# Patient Record
Sex: Female | Born: 1940 | Race: White | Hispanic: No | Smoking: Never smoker
Health system: Southern US, Community
[De-identification: ages and names within clinical notes are randomized; demographics above are authoritative.]

## PROBLEM LIST (undated history)

## (undated) DIAGNOSIS — M1990 Unspecified osteoarthritis, unspecified site: Secondary | ICD-10-CM

## (undated) DIAGNOSIS — K219 Gastro-esophageal reflux disease without esophagitis: Secondary | ICD-10-CM

## (undated) DIAGNOSIS — Z9889 Other specified postprocedural states: Secondary | ICD-10-CM

## (undated) DIAGNOSIS — I050 Rheumatic mitral stenosis: Secondary | ICD-10-CM

## (undated) DIAGNOSIS — I340 Nonrheumatic mitral (valve) insufficiency: Secondary | ICD-10-CM

## (undated) DIAGNOSIS — R112 Nausea with vomiting, unspecified: Secondary | ICD-10-CM

## (undated) DIAGNOSIS — K648 Other hemorrhoids: Secondary | ICD-10-CM

## (undated) DIAGNOSIS — IMO0002 Reserved for concepts with insufficient information to code with codable children: Secondary | ICD-10-CM

## (undated) DIAGNOSIS — T7840XA Allergy, unspecified, initial encounter: Secondary | ICD-10-CM

## (undated) DIAGNOSIS — I2510 Atherosclerotic heart disease of native coronary artery without angina pectoris: Secondary | ICD-10-CM

## (undated) DIAGNOSIS — Z8489 Family history of other specified conditions: Secondary | ICD-10-CM

## (undated) DIAGNOSIS — C50919 Malignant neoplasm of unspecified site of unspecified female breast: Secondary | ICD-10-CM

## (undated) DIAGNOSIS — Z951 Presence of aortocoronary bypass graft: Secondary | ICD-10-CM

## (undated) DIAGNOSIS — E7800 Pure hypercholesterolemia, unspecified: Secondary | ICD-10-CM

## (undated) DIAGNOSIS — R011 Cardiac murmur, unspecified: Secondary | ICD-10-CM

## (undated) DIAGNOSIS — C189 Malignant neoplasm of colon, unspecified: Secondary | ICD-10-CM

## (undated) DIAGNOSIS — Z950 Presence of cardiac pacemaker: Secondary | ICD-10-CM

## (undated) DIAGNOSIS — I4821 Permanent atrial fibrillation: Secondary | ICD-10-CM

## (undated) DIAGNOSIS — M8580 Other specified disorders of bone density and structure, unspecified site: Secondary | ICD-10-CM

## (undated) DIAGNOSIS — R9430 Abnormal result of cardiovascular function study, unspecified: Secondary | ICD-10-CM

## (undated) DIAGNOSIS — H269 Unspecified cataract: Secondary | ICD-10-CM

## (undated) DIAGNOSIS — C180 Malignant neoplasm of cecum: Secondary | ICD-10-CM

## (undated) DIAGNOSIS — I442 Atrioventricular block, complete: Secondary | ICD-10-CM

## (undated) DIAGNOSIS — I272 Pulmonary hypertension, unspecified: Secondary | ICD-10-CM

## (undated) DIAGNOSIS — R9431 Abnormal electrocardiogram [ECG] [EKG]: Secondary | ICD-10-CM

## (undated) DIAGNOSIS — K635 Polyp of colon: Secondary | ICD-10-CM

## (undated) DIAGNOSIS — J45909 Unspecified asthma, uncomplicated: Secondary | ICD-10-CM

## (undated) DIAGNOSIS — K5730 Diverticulosis of large intestine without perforation or abscess without bleeding: Secondary | ICD-10-CM

## (undated) DIAGNOSIS — I509 Heart failure, unspecified: Secondary | ICD-10-CM

## (undated) DIAGNOSIS — E119 Type 2 diabetes mellitus without complications: Secondary | ICD-10-CM

## (undated) DIAGNOSIS — D649 Anemia, unspecified: Secondary | ICD-10-CM

## (undated) DIAGNOSIS — I519 Heart disease, unspecified: Secondary | ICD-10-CM

## (undated) DIAGNOSIS — D689 Coagulation defect, unspecified: Secondary | ICD-10-CM

## (undated) DIAGNOSIS — F329 Major depressive disorder, single episode, unspecified: Secondary | ICD-10-CM

## (undated) DIAGNOSIS — I495 Sick sinus syndrome: Secondary | ICD-10-CM

## (undated) DIAGNOSIS — I10 Essential (primary) hypertension: Secondary | ICD-10-CM

## (undated) DIAGNOSIS — T4145XA Adverse effect of unspecified anesthetic, initial encounter: Secondary | ICD-10-CM

## (undated) HISTORY — PX: ABDOMINAL HYSTERECTOMY: SHX81

## (undated) HISTORY — DX: Anemia, unspecified: D64.9

## (undated) HISTORY — PX: BREAST EXCISIONAL BIOPSY: SUR124

## (undated) HISTORY — DX: Reserved for concepts with insufficient information to code with codable children: IMO0002

## (undated) HISTORY — PX: COLONOSCOPY: SHX174

## (undated) HISTORY — DX: Nonrheumatic mitral (valve) insufficiency: I34.0

## (undated) HISTORY — DX: Essential (primary) hypertension: I10

## (undated) HISTORY — DX: Polyp of colon: K63.5

## (undated) HISTORY — DX: Atrioventricular block, complete: I44.2

## (undated) HISTORY — DX: Abnormal result of cardiovascular function study, unspecified: R94.30

## (undated) HISTORY — DX: Other specified postprocedural states: Z98.890

## (undated) HISTORY — PX: TONSILLECTOMY AND ADENOIDECTOMY: SUR1326

## (undated) HISTORY — DX: Presence of aortocoronary bypass graft: Z95.1

## (undated) HISTORY — DX: Pulmonary hypertension, unspecified: I27.20

## (undated) HISTORY — DX: Other specified disorders of bone density and structure, unspecified site: M85.80

## (undated) HISTORY — DX: Abnormal electrocardiogram (ECG) (EKG): R94.31

## (undated) HISTORY — DX: Diverticulosis of large intestine without perforation or abscess without bleeding: K57.30

## (undated) HISTORY — DX: Rheumatic mitral stenosis: I05.0

## (undated) HISTORY — DX: Coagulation defect, unspecified: D68.9

## (undated) HISTORY — DX: Permanent atrial fibrillation: I48.21

## (undated) HISTORY — DX: Malignant neoplasm of colon, unspecified: C18.9

## (undated) HISTORY — PX: POLYPECTOMY: SHX149

## (undated) HISTORY — DX: Atherosclerotic heart disease of native coronary artery without angina pectoris: I25.10

## (undated) HISTORY — DX: Allergy, unspecified, initial encounter: T78.40XA

## (undated) HISTORY — DX: Heart failure, unspecified: I50.9

## (undated) HISTORY — PX: BREAST SURGERY: SHX581

## (undated) HISTORY — DX: Malignant neoplasm of unspecified site of unspecified female breast: C50.919

## (undated) HISTORY — DX: Heart disease, unspecified: I51.9

## (undated) HISTORY — PX: EYE SURGERY: SHX253

## (undated) HISTORY — DX: Other hemorrhoids: K64.8

---

## 1993-09-04 HISTORY — PX: CHOLECYSTECTOMY: SHX55

## 1998-03-18 ENCOUNTER — Other Ambulatory Visit: Admission: RE | Admit: 1998-03-18 | Discharge: 1998-03-18 | Payer: Self-pay | Admitting: Cardiology

## 1998-03-19 ENCOUNTER — Ambulatory Visit (HOSPITAL_COMMUNITY): Admission: RE | Admit: 1998-03-19 | Discharge: 1998-03-19 | Payer: Self-pay | Admitting: Cardiovascular Disease

## 1998-11-23 ENCOUNTER — Other Ambulatory Visit: Admission: RE | Admit: 1998-11-23 | Discharge: 1998-11-23 | Payer: Self-pay | Admitting: *Deleted

## 1999-09-05 HISTORY — PX: CORONARY ARTERY BYPASS GRAFT: SHX141

## 1999-09-05 HISTORY — PX: MAZE: SHX5063

## 1999-09-05 HISTORY — PX: MITRAL VALVE REPAIR: SHX2039

## 1999-11-08 ENCOUNTER — Other Ambulatory Visit: Admission: RE | Admit: 1999-11-08 | Discharge: 1999-11-08 | Payer: Self-pay | Admitting: Plastic Surgery

## 1999-11-08 ENCOUNTER — Encounter (INDEPENDENT_AMBULATORY_CARE_PROVIDER_SITE_OTHER): Payer: Self-pay | Admitting: Specialist

## 1999-11-29 ENCOUNTER — Other Ambulatory Visit: Admission: RE | Admit: 1999-11-29 | Discharge: 1999-11-29 | Payer: Self-pay | Admitting: *Deleted

## 2000-03-20 ENCOUNTER — Ambulatory Visit (HOSPITAL_COMMUNITY): Admission: RE | Admit: 2000-03-20 | Discharge: 2000-03-20 | Payer: Self-pay | Admitting: Cardiology

## 2000-04-16 ENCOUNTER — Ambulatory Visit (HOSPITAL_COMMUNITY): Admission: RE | Admit: 2000-04-16 | Discharge: 2000-04-16 | Payer: Self-pay | Admitting: Cardiology

## 2001-08-13 ENCOUNTER — Encounter: Payer: Self-pay | Admitting: General Surgery

## 2001-08-13 ENCOUNTER — Encounter: Admission: RE | Admit: 2001-08-13 | Discharge: 2001-08-13 | Payer: Self-pay | Admitting: General Surgery

## 2002-02-06 ENCOUNTER — Encounter: Payer: Self-pay | Admitting: General Surgery

## 2002-02-06 ENCOUNTER — Encounter: Admission: RE | Admit: 2002-02-06 | Discharge: 2002-02-06 | Payer: Self-pay | Admitting: General Surgery

## 2002-07-11 ENCOUNTER — Other Ambulatory Visit: Admission: RE | Admit: 2002-07-11 | Discharge: 2002-07-11 | Payer: Self-pay | Admitting: Obstetrics and Gynecology

## 2002-08-04 ENCOUNTER — Encounter: Payer: Self-pay | Admitting: Gastroenterology

## 2002-08-04 DIAGNOSIS — K5730 Diverticulosis of large intestine without perforation or abscess without bleeding: Secondary | ICD-10-CM

## 2002-08-04 HISTORY — DX: Diverticulosis of large intestine without perforation or abscess without bleeding: K57.30

## 2002-11-03 ENCOUNTER — Encounter: Admission: RE | Admit: 2002-11-03 | Discharge: 2003-02-01 | Payer: Self-pay | Admitting: Internal Medicine

## 2003-02-17 ENCOUNTER — Encounter: Admission: RE | Admit: 2003-02-17 | Discharge: 2003-05-18 | Payer: Self-pay | Admitting: Internal Medicine

## 2003-02-25 ENCOUNTER — Encounter: Payer: Self-pay | Admitting: General Surgery

## 2003-02-25 ENCOUNTER — Encounter: Admission: RE | Admit: 2003-02-25 | Discharge: 2003-02-25 | Payer: Self-pay | Admitting: General Surgery

## 2003-09-14 ENCOUNTER — Other Ambulatory Visit: Admission: RE | Admit: 2003-09-14 | Discharge: 2003-09-14 | Payer: Self-pay | Admitting: Obstetrics and Gynecology

## 2004-03-09 ENCOUNTER — Encounter: Admission: RE | Admit: 2004-03-09 | Discharge: 2004-03-09 | Payer: Self-pay | Admitting: Internal Medicine

## 2004-08-01 ENCOUNTER — Ambulatory Visit: Payer: Self-pay | Admitting: Internal Medicine

## 2004-08-31 ENCOUNTER — Ambulatory Visit: Payer: Self-pay | Admitting: Internal Medicine

## 2004-09-26 ENCOUNTER — Ambulatory Visit: Payer: Self-pay

## 2004-09-28 ENCOUNTER — Ambulatory Visit: Payer: Self-pay | Admitting: Cardiology

## 2004-10-26 ENCOUNTER — Ambulatory Visit: Payer: Self-pay | Admitting: Cardiology

## 2004-11-22 ENCOUNTER — Ambulatory Visit: Payer: Self-pay | Admitting: Internal Medicine

## 2004-12-27 ENCOUNTER — Ambulatory Visit: Payer: Self-pay | Admitting: *Deleted

## 2005-01-24 ENCOUNTER — Ambulatory Visit: Payer: Self-pay | Admitting: Internal Medicine

## 2005-02-14 ENCOUNTER — Ambulatory Visit: Payer: Self-pay | Admitting: Internal Medicine

## 2005-03-14 ENCOUNTER — Encounter: Admission: RE | Admit: 2005-03-14 | Discharge: 2005-03-14 | Payer: Self-pay | Admitting: Internal Medicine

## 2005-03-16 ENCOUNTER — Ambulatory Visit: Payer: Self-pay | Admitting: Cardiology

## 2005-03-20 ENCOUNTER — Encounter: Admission: RE | Admit: 2005-03-20 | Discharge: 2005-03-20 | Payer: Self-pay | Admitting: Internal Medicine

## 2005-03-20 ENCOUNTER — Encounter (INDEPENDENT_AMBULATORY_CARE_PROVIDER_SITE_OTHER): Payer: Self-pay | Admitting: Specialist

## 2005-04-06 ENCOUNTER — Encounter: Admission: RE | Admit: 2005-04-06 | Discharge: 2005-04-06 | Payer: Self-pay | Admitting: General Surgery

## 2005-04-07 ENCOUNTER — Ambulatory Visit (HOSPITAL_COMMUNITY): Admission: RE | Admit: 2005-04-07 | Discharge: 2005-04-07 | Payer: Self-pay | Admitting: General Surgery

## 2005-04-07 ENCOUNTER — Ambulatory Visit: Payer: Self-pay | Admitting: Cardiology

## 2005-04-07 ENCOUNTER — Encounter (INDEPENDENT_AMBULATORY_CARE_PROVIDER_SITE_OTHER): Payer: Self-pay | Admitting: *Deleted

## 2005-04-07 ENCOUNTER — Encounter: Admission: RE | Admit: 2005-04-07 | Discharge: 2005-04-07 | Payer: Self-pay | Admitting: General Surgery

## 2005-04-12 ENCOUNTER — Ambulatory Visit: Payer: Self-pay | Admitting: Cardiology

## 2005-04-28 ENCOUNTER — Ambulatory Visit: Payer: Self-pay | Admitting: Internal Medicine

## 2005-05-03 ENCOUNTER — Ambulatory Visit: Payer: Self-pay | Admitting: *Deleted

## 2005-05-19 ENCOUNTER — Ambulatory Visit: Payer: Self-pay | Admitting: Cardiology

## 2005-06-07 ENCOUNTER — Ambulatory Visit: Payer: Self-pay | Admitting: Cardiology

## 2005-06-19 ENCOUNTER — Ambulatory Visit: Payer: Self-pay | Admitting: Internal Medicine

## 2005-07-03 ENCOUNTER — Ambulatory Visit: Payer: Self-pay | Admitting: Internal Medicine

## 2005-08-04 ENCOUNTER — Ambulatory Visit: Payer: Self-pay | Admitting: Cardiovascular Disease

## 2005-08-18 ENCOUNTER — Ambulatory Visit: Payer: Self-pay | Admitting: Cardiology

## 2005-09-25 ENCOUNTER — Ambulatory Visit: Payer: Self-pay | Admitting: Internal Medicine

## 2005-10-17 ENCOUNTER — Ambulatory Visit: Payer: Self-pay | Admitting: *Deleted

## 2005-10-31 ENCOUNTER — Ambulatory Visit: Payer: Self-pay | Admitting: *Deleted

## 2005-11-28 ENCOUNTER — Ambulatory Visit: Payer: Self-pay | Admitting: Cardiology

## 2005-12-26 ENCOUNTER — Ambulatory Visit: Payer: Self-pay | Admitting: Cardiovascular Disease

## 2005-12-27 ENCOUNTER — Ambulatory Visit: Payer: Self-pay | Admitting: Cardiology

## 2006-01-23 ENCOUNTER — Ambulatory Visit: Payer: Self-pay | Admitting: *Deleted

## 2006-02-20 ENCOUNTER — Ambulatory Visit: Payer: Self-pay | Admitting: Cardiology

## 2006-03-15 ENCOUNTER — Encounter: Admission: RE | Admit: 2006-03-15 | Discharge: 2006-03-15 | Payer: Self-pay | Admitting: General Surgery

## 2006-03-20 ENCOUNTER — Ambulatory Visit: Payer: Self-pay | Admitting: Cardiology

## 2006-04-17 ENCOUNTER — Ambulatory Visit: Payer: Self-pay | Admitting: Cardiology

## 2006-05-15 ENCOUNTER — Ambulatory Visit: Payer: Self-pay | Admitting: Cardiovascular Disease

## 2006-06-12 ENCOUNTER — Ambulatory Visit: Payer: Self-pay | Admitting: Internal Medicine

## 2006-07-10 ENCOUNTER — Ambulatory Visit: Payer: Self-pay | Admitting: *Deleted

## 2006-08-07 ENCOUNTER — Ambulatory Visit: Payer: Self-pay | Admitting: Cardiology

## 2006-08-21 ENCOUNTER — Ambulatory Visit: Payer: Self-pay | Admitting: Cardiology

## 2006-09-14 ENCOUNTER — Ambulatory Visit: Payer: Self-pay | Admitting: Gastroenterology

## 2006-09-18 ENCOUNTER — Ambulatory Visit: Payer: Self-pay | Admitting: Cardiology

## 2006-10-16 ENCOUNTER — Ambulatory Visit: Payer: Self-pay | Admitting: Cardiology

## 2006-11-13 ENCOUNTER — Ambulatory Visit: Payer: Self-pay | Admitting: Cardiology

## 2006-12-12 ENCOUNTER — Ambulatory Visit: Payer: Self-pay | Admitting: Internal Medicine

## 2006-12-18 ENCOUNTER — Ambulatory Visit: Payer: Self-pay | Admitting: Cardiology

## 2007-01-10 ENCOUNTER — Ambulatory Visit: Payer: Self-pay | Admitting: Cardiology

## 2007-02-06 ENCOUNTER — Ambulatory Visit: Payer: Self-pay | Admitting: Cardiology

## 2007-03-06 ENCOUNTER — Ambulatory Visit: Payer: Self-pay | Admitting: Cardiology

## 2007-03-18 ENCOUNTER — Encounter: Admission: RE | Admit: 2007-03-18 | Discharge: 2007-03-18 | Payer: Self-pay | Admitting: Internal Medicine

## 2007-04-10 ENCOUNTER — Ambulatory Visit: Payer: Self-pay | Admitting: Cardiology

## 2007-05-13 ENCOUNTER — Ambulatory Visit: Payer: Self-pay | Admitting: Cardiology

## 2007-06-11 ENCOUNTER — Ambulatory Visit: Payer: Self-pay | Admitting: Cardiovascular Disease

## 2007-07-09 ENCOUNTER — Ambulatory Visit: Payer: Self-pay | Admitting: Cardiology

## 2007-07-30 ENCOUNTER — Ambulatory Visit: Payer: Self-pay | Admitting: Internal Medicine

## 2007-08-27 ENCOUNTER — Ambulatory Visit: Payer: Self-pay | Admitting: Cardiology

## 2007-09-25 ENCOUNTER — Ambulatory Visit: Payer: Self-pay | Admitting: Cardiovascular Disease

## 2007-10-23 ENCOUNTER — Ambulatory Visit: Payer: Self-pay | Admitting: Internal Medicine

## 2007-11-21 ENCOUNTER — Ambulatory Visit: Payer: Self-pay | Admitting: Cardiology

## 2007-12-04 ENCOUNTER — Ambulatory Visit: Payer: Self-pay | Admitting: Internal Medicine

## 2007-12-19 ENCOUNTER — Ambulatory Visit: Payer: Self-pay | Admitting: Cardiology

## 2007-12-27 ENCOUNTER — Encounter: Payer: Self-pay | Admitting: Cardiology

## 2007-12-27 ENCOUNTER — Ambulatory Visit: Payer: Self-pay

## 2008-01-02 ENCOUNTER — Ambulatory Visit: Payer: Self-pay | Admitting: Cardiology

## 2008-01-23 ENCOUNTER — Ambulatory Visit: Payer: Self-pay | Admitting: Internal Medicine

## 2008-02-20 ENCOUNTER — Ambulatory Visit: Payer: Self-pay | Admitting: Cardiology

## 2008-03-19 ENCOUNTER — Encounter: Admission: RE | Admit: 2008-03-19 | Discharge: 2008-03-19 | Payer: Self-pay | Admitting: Internal Medicine

## 2008-03-19 ENCOUNTER — Ambulatory Visit: Payer: Self-pay | Admitting: Cardiology

## 2008-04-16 ENCOUNTER — Ambulatory Visit: Payer: Self-pay | Admitting: Cardiology

## 2008-05-18 ENCOUNTER — Ambulatory Visit: Payer: Self-pay | Admitting: Cardiology

## 2008-06-15 ENCOUNTER — Ambulatory Visit: Payer: Self-pay | Admitting: Cardiology

## 2008-06-22 ENCOUNTER — Ambulatory Visit: Payer: Self-pay | Admitting: Internal Medicine

## 2008-07-07 DIAGNOSIS — E782 Mixed hyperlipidemia: Secondary | ICD-10-CM

## 2008-07-07 DIAGNOSIS — E1169 Type 2 diabetes mellitus with other specified complication: Secondary | ICD-10-CM | POA: Insufficient documentation

## 2008-07-07 DIAGNOSIS — I10 Essential (primary) hypertension: Secondary | ICD-10-CM

## 2008-07-07 DIAGNOSIS — E1159 Type 2 diabetes mellitus with other circulatory complications: Secondary | ICD-10-CM | POA: Insufficient documentation

## 2008-07-07 HISTORY — DX: Essential (primary) hypertension: I10

## 2008-07-07 HISTORY — DX: Mixed hyperlipidemia: E78.2

## 2008-07-13 ENCOUNTER — Ambulatory Visit: Payer: Self-pay | Admitting: Gastroenterology

## 2008-07-20 ENCOUNTER — Ambulatory Visit: Payer: Self-pay | Admitting: Cardiology

## 2008-07-28 ENCOUNTER — Ambulatory Visit: Payer: Self-pay | Admitting: Cardiology

## 2008-08-25 ENCOUNTER — Ambulatory Visit: Payer: Self-pay | Admitting: Internal Medicine

## 2008-09-21 ENCOUNTER — Ambulatory Visit: Payer: Self-pay | Admitting: Cardiology

## 2008-10-20 ENCOUNTER — Ambulatory Visit: Payer: Self-pay | Admitting: Cardiology

## 2008-11-09 ENCOUNTER — Ambulatory Visit: Payer: Self-pay | Admitting: Cardiology

## 2008-11-25 ENCOUNTER — Ambulatory Visit: Payer: Self-pay | Admitting: Internal Medicine

## 2008-12-02 ENCOUNTER — Ambulatory Visit: Payer: Self-pay | Admitting: Cardiology

## 2008-12-14 ENCOUNTER — Ambulatory Visit: Payer: Self-pay | Admitting: Internal Medicine

## 2008-12-31 ENCOUNTER — Ambulatory Visit: Payer: Self-pay | Admitting: Cardiology

## 2009-01-27 ENCOUNTER — Ambulatory Visit: Payer: Self-pay | Admitting: Internal Medicine

## 2009-02-02 ENCOUNTER — Encounter: Payer: Self-pay | Admitting: *Deleted

## 2009-03-02 DIAGNOSIS — Z9089 Acquired absence of other organs: Secondary | ICD-10-CM | POA: Insufficient documentation

## 2009-03-02 DIAGNOSIS — E785 Hyperlipidemia, unspecified: Secondary | ICD-10-CM | POA: Insufficient documentation

## 2009-03-02 DIAGNOSIS — Z951 Presence of aortocoronary bypass graft: Secondary | ICD-10-CM

## 2009-03-02 DIAGNOSIS — E119 Type 2 diabetes mellitus without complications: Secondary | ICD-10-CM

## 2009-03-02 DIAGNOSIS — I2510 Atherosclerotic heart disease of native coronary artery without angina pectoris: Secondary | ICD-10-CM | POA: Insufficient documentation

## 2009-03-02 HISTORY — DX: Type 2 diabetes mellitus without complications: E11.9

## 2009-03-04 ENCOUNTER — Ambulatory Visit: Payer: Self-pay | Admitting: Cardiology

## 2009-03-10 ENCOUNTER — Encounter: Payer: Self-pay | Admitting: *Deleted

## 2009-03-25 ENCOUNTER — Encounter: Admission: RE | Admit: 2009-03-25 | Discharge: 2009-03-25 | Payer: Self-pay | Admitting: Internal Medicine

## 2009-03-25 ENCOUNTER — Ambulatory Visit: Payer: Self-pay | Admitting: Cardiology

## 2009-04-27 ENCOUNTER — Ambulatory Visit: Payer: Self-pay | Admitting: Internal Medicine

## 2009-05-26 ENCOUNTER — Ambulatory Visit: Payer: Self-pay | Admitting: Internal Medicine

## 2009-06-23 ENCOUNTER — Ambulatory Visit: Payer: Self-pay | Admitting: Internal Medicine

## 2009-07-13 ENCOUNTER — Ambulatory Visit: Payer: Self-pay | Admitting: Cardiology

## 2009-07-19 ENCOUNTER — Encounter: Payer: Self-pay | Admitting: Cardiology

## 2009-08-02 ENCOUNTER — Encounter: Payer: Self-pay | Admitting: Cardiology

## 2009-08-03 ENCOUNTER — Ambulatory Visit: Payer: Self-pay | Admitting: Cardiovascular Disease

## 2009-08-18 ENCOUNTER — Ambulatory Visit: Payer: Self-pay | Admitting: Internal Medicine

## 2009-09-16 ENCOUNTER — Ambulatory Visit: Payer: Self-pay | Admitting: Internal Medicine

## 2009-09-16 LAB — CONVERTED CEMR LAB: POC INR: 2

## 2009-10-11 ENCOUNTER — Encounter: Payer: Self-pay | Admitting: Cardiology

## 2009-10-12 ENCOUNTER — Encounter: Payer: Self-pay | Admitting: Cardiology

## 2009-10-12 LAB — CONVERTED CEMR LAB
POC INR: 1.98
Prothrombin Time: 22.3 s

## 2009-10-13 ENCOUNTER — Ambulatory Visit: Payer: Self-pay | Admitting: Cardiology

## 2009-10-13 ENCOUNTER — Inpatient Hospital Stay (HOSPITAL_COMMUNITY): Admission: AD | Admit: 2009-10-13 | Discharge: 2009-10-26 | Payer: Self-pay | Admitting: Cardiology

## 2009-10-14 ENCOUNTER — Encounter: Payer: Self-pay | Admitting: Cardiology

## 2009-10-16 ENCOUNTER — Encounter: Payer: Self-pay | Admitting: Cardiology

## 2009-10-19 ENCOUNTER — Encounter: Payer: Self-pay | Admitting: Cardiology

## 2009-10-20 ENCOUNTER — Encounter: Payer: Self-pay | Admitting: Cardiology

## 2009-10-20 ENCOUNTER — Ambulatory Visit: Payer: Self-pay | Admitting: Pulmonary Disease

## 2009-10-26 ENCOUNTER — Telehealth: Payer: Self-pay | Admitting: Cardiology

## 2009-10-27 ENCOUNTER — Encounter: Payer: Self-pay | Admitting: Cardiology

## 2009-10-28 ENCOUNTER — Encounter: Payer: Self-pay | Admitting: Cardiology

## 2009-10-28 ENCOUNTER — Telehealth: Payer: Self-pay | Admitting: Cardiology

## 2009-10-28 ENCOUNTER — Telehealth (INDEPENDENT_AMBULATORY_CARE_PROVIDER_SITE_OTHER): Payer: Self-pay | Admitting: *Deleted

## 2009-10-29 ENCOUNTER — Telehealth: Payer: Self-pay | Admitting: Cardiology

## 2009-11-08 ENCOUNTER — Encounter: Payer: Self-pay | Admitting: Cardiology

## 2009-11-08 DIAGNOSIS — I509 Heart failure, unspecified: Secondary | ICD-10-CM | POA: Insufficient documentation

## 2009-11-08 DIAGNOSIS — I079 Rheumatic tricuspid valve disease, unspecified: Secondary | ICD-10-CM | POA: Insufficient documentation

## 2009-11-08 DIAGNOSIS — I2789 Other specified pulmonary heart diseases: Secondary | ICD-10-CM | POA: Insufficient documentation

## 2009-11-09 ENCOUNTER — Ambulatory Visit: Payer: Self-pay | Admitting: Cardiology

## 2009-11-09 ENCOUNTER — Encounter: Payer: Self-pay | Admitting: Cardiology

## 2009-11-09 ENCOUNTER — Encounter (INDEPENDENT_AMBULATORY_CARE_PROVIDER_SITE_OTHER): Payer: Self-pay | Admitting: Cardiology

## 2009-11-09 LAB — CONVERTED CEMR LAB: POC INR: 3.7

## 2009-11-11 ENCOUNTER — Telehealth: Payer: Self-pay | Admitting: Cardiology

## 2009-11-16 ENCOUNTER — Telehealth: Payer: Self-pay | Admitting: Cardiology

## 2009-11-18 ENCOUNTER — Encounter: Payer: Self-pay | Admitting: Cardiology

## 2009-12-01 ENCOUNTER — Ambulatory Visit: Payer: Self-pay | Admitting: Cardiology

## 2009-12-01 ENCOUNTER — Ambulatory Visit: Payer: Self-pay | Admitting: Cardiovascular Disease

## 2009-12-22 ENCOUNTER — Ambulatory Visit: Payer: Self-pay | Admitting: Cardiology

## 2009-12-22 LAB — CONVERTED CEMR LAB: POC INR: 1.5

## 2010-01-06 ENCOUNTER — Ambulatory Visit: Payer: Self-pay | Admitting: Internal Medicine

## 2010-01-06 ENCOUNTER — Ambulatory Visit: Payer: Self-pay | Admitting: Cardiology

## 2010-01-06 DIAGNOSIS — I428 Other cardiomyopathies: Secondary | ICD-10-CM | POA: Insufficient documentation

## 2010-01-06 LAB — CONVERTED CEMR LAB: POC INR: 2.4

## 2010-01-20 ENCOUNTER — Telehealth: Payer: Self-pay | Admitting: Cardiology

## 2010-02-07 ENCOUNTER — Ambulatory Visit: Payer: Self-pay | Admitting: Cardiology

## 2010-02-07 ENCOUNTER — Ambulatory Visit: Payer: Self-pay

## 2010-02-07 ENCOUNTER — Encounter (INDEPENDENT_AMBULATORY_CARE_PROVIDER_SITE_OTHER): Payer: Self-pay | Admitting: *Deleted

## 2010-02-07 ENCOUNTER — Ambulatory Visit (HOSPITAL_COMMUNITY): Admission: RE | Admit: 2010-02-07 | Discharge: 2010-02-07 | Payer: Self-pay | Admitting: Cardiology

## 2010-02-07 ENCOUNTER — Encounter: Payer: Self-pay | Admitting: Cardiology

## 2010-02-07 ENCOUNTER — Ambulatory Visit: Payer: Self-pay | Admitting: Internal Medicine

## 2010-02-07 LAB — CONVERTED CEMR LAB: POC INR: 2.4

## 2010-02-16 ENCOUNTER — Encounter: Payer: Self-pay | Admitting: Cardiology

## 2010-02-17 ENCOUNTER — Ambulatory Visit: Payer: Self-pay | Admitting: Cardiology

## 2010-02-21 LAB — CONVERTED CEMR LAB
Chloride: 106 meq/L (ref 96–112)
Potassium: 3.9 meq/L (ref 3.5–5.1)
Sodium: 144 meq/L (ref 135–145)

## 2010-03-08 ENCOUNTER — Encounter: Payer: Self-pay | Admitting: Cardiology

## 2010-03-11 ENCOUNTER — Telehealth (INDEPENDENT_AMBULATORY_CARE_PROVIDER_SITE_OTHER): Payer: Self-pay | Admitting: *Deleted

## 2010-03-11 ENCOUNTER — Ambulatory Visit: Payer: Self-pay | Admitting: Cardiovascular Disease

## 2010-03-18 ENCOUNTER — Encounter: Payer: Self-pay | Admitting: Cardiology

## 2010-03-24 ENCOUNTER — Ambulatory Visit: Payer: Self-pay | Admitting: Cardiology

## 2010-03-31 ENCOUNTER — Encounter: Admission: RE | Admit: 2010-03-31 | Discharge: 2010-03-31 | Payer: Self-pay | Admitting: Internal Medicine

## 2010-04-04 ENCOUNTER — Encounter: Payer: Self-pay | Admitting: Cardiology

## 2010-04-07 ENCOUNTER — Ambulatory Visit: Payer: Self-pay | Admitting: Cardiovascular Disease

## 2010-04-21 ENCOUNTER — Ambulatory Visit (HOSPITAL_COMMUNITY): Admission: RE | Admit: 2010-04-21 | Discharge: 2010-04-21 | Payer: Self-pay | Admitting: Internal Medicine

## 2010-04-27 ENCOUNTER — Ambulatory Visit: Payer: Self-pay | Admitting: Internal Medicine

## 2010-04-27 LAB — CONVERTED CEMR LAB: POC INR: 3.6

## 2010-05-04 ENCOUNTER — Encounter: Payer: Self-pay | Admitting: Cardiology

## 2010-05-19 ENCOUNTER — Ambulatory Visit: Payer: Self-pay | Admitting: Internal Medicine

## 2010-05-19 LAB — CONVERTED CEMR LAB: POC INR: 4.2

## 2010-06-09 ENCOUNTER — Ambulatory Visit: Payer: Self-pay

## 2010-06-09 LAB — CONVERTED CEMR LAB: POC INR: 3

## 2010-07-07 ENCOUNTER — Ambulatory Visit: Payer: Self-pay | Admitting: Cardiology

## 2010-07-07 LAB — CONVERTED CEMR LAB: POC INR: 2.6

## 2010-07-21 ENCOUNTER — Encounter: Payer: Self-pay | Admitting: Cardiology

## 2010-08-11 ENCOUNTER — Ambulatory Visit: Payer: Self-pay | Admitting: Internal Medicine

## 2010-09-01 ENCOUNTER — Ambulatory Visit: Payer: Self-pay | Admitting: Cardiology

## 2010-09-29 ENCOUNTER — Ambulatory Visit: Admit: 2010-09-29 | Payer: Self-pay

## 2010-10-06 ENCOUNTER — Ambulatory Visit (INDEPENDENT_AMBULATORY_CARE_PROVIDER_SITE_OTHER): Payer: BC Managed Care – PPO | Admitting: Cardiology

## 2010-10-06 ENCOUNTER — Ambulatory Visit (INDEPENDENT_AMBULATORY_CARE_PROVIDER_SITE_OTHER)
Admission: RE | Admit: 2010-10-06 | Discharge: 2010-10-06 | Disposition: A | Discharge status: Home / Self Care | Payer: BC Managed Care – PPO | Source: Ambulatory Visit | Attending: Cardiology | Admitting: Cardiology

## 2010-10-06 ENCOUNTER — Encounter: Payer: Self-pay | Admitting: Cardiology

## 2010-10-06 ENCOUNTER — Encounter (INDEPENDENT_AMBULATORY_CARE_PROVIDER_SITE_OTHER): Payer: BC Managed Care – PPO

## 2010-10-06 ENCOUNTER — Other Ambulatory Visit: Payer: Self-pay | Admitting: Cardiology

## 2010-10-06 DIAGNOSIS — R0600 Dyspnea, unspecified: Secondary | ICD-10-CM

## 2010-10-06 DIAGNOSIS — I4891 Unspecified atrial fibrillation: Secondary | ICD-10-CM

## 2010-10-06 DIAGNOSIS — Z7901 Long term (current) use of anticoagulants: Secondary | ICD-10-CM

## 2010-10-06 DIAGNOSIS — R0609 Other forms of dyspnea: Secondary | ICD-10-CM

## 2010-10-06 DIAGNOSIS — I059 Rheumatic mitral valve disease, unspecified: Secondary | ICD-10-CM

## 2010-10-06 DIAGNOSIS — R0602 Shortness of breath: Secondary | ICD-10-CM

## 2010-10-06 LAB — CONVERTED CEMR LAB: POC INR: 3.8

## 2010-10-06 NOTE — Miscellaneous (Signed)
  Clinical Lists Changes  Observations: Added new observation of CARDIO HPI: I have reviewed the fact that patient is on Effexor (can prolong QT) and Tikosyn. On both drugs her QT is well within normal. She will remain on both drugs. (03/18/2010 15:08) Added new observation of PRIMARY MD: Marisue Brooklyn, DO (03/18/2010 15:08)      Primary Provider:  Marisue Brooklyn, DO   History of Present Illness: I have reviewed the fact that patient is on Effexor (can prolong QT) and Tikosyn. On both drugs her QT is well within normal. She will remain on both drugs.

## 2010-10-06 NOTE — Medication Information (Signed)
Summary: rov/ewj  Anticoagulant Therapy  Managed by: Bethena Midget, RN, BSN Referring MD: [name] MD PCP: Marisue Brooklyn, DO Supervising MD: Jens Som MD, Arlys John Indication 1: Atrial Fibrillation (ICD-427.31) Lab Used: [facility] Site: [facility] INR POC 1.5 INR RANGE 2 - 3  Dietary changes: yes       Details: Eating more green leafy veggies  Health status changes: no    Bleeding/hemorrhagic complications: no    Recent/future hospitalizations: no    Any changes in medication regimen? no    Recent/future dental: no  Any missed doses?: no       Is patient compliant with meds? yes       Allergies: 1)  ! * Tetanus 2)  ! Demerol (Meperidine Hcl) 3)  ! * Toprol 4)  ! Morphine  Anticoagulation Management History:      The patient is taking warfarin and comes in today for a routine follow up visit.  Positive risk factors for bleeding include an age of 21 years or older and presence of serious comorbidities.  The bleeding index is 'intermediate risk'.  Positive CHADS2 values include History of CHF, History of HTN, and History of Diabetes.  Negative CHADS2 values include Age > 11 years old.  The start date was 01/26/1998.  Anticoagulation responsible provider: Jens Som MD, Arlys John.  INR POC: 1.5.  Cuvette Lot#: [id number].  Exp: 02/2011.    Anticoagulation Management Assessment/Plan:      The patient's current anticoagulation dose is Coumadin 5 mg tabs: Take as directed by coumadin clinic..  The target INR is 2 - 3.  The next INR is due 01/06/2010.  Anticoagulation instructions were given to patient.  Results were reviewed/authorized by Bethena Midget, RN, BSN.  She was notified by Bethena Midget, RN, BSN.         Prior Anticoagulation Instructions: INR 1.7  Take 1.5 tablets today then start taking 1 tablet daily except 1/2 tablet on Tuesdays and Saturdays.  Recheck in 3 weeks.    Current Anticoagulation Instructions: INR 1.5 Today take 7.5mg s then change dose to 5mg s everyday  except 2.5mg s on Tuesdays. Recheck in 2 weeks.

## 2010-10-06 NOTE — Medication Information (Signed)
Summary: rov/sp  Anticoagulant Therapy  Managed by: Weston Brass, PharmD Referring MD: [name] MD PCP: Marisue Brooklyn, DO Supervising MD: Patty Sermons Indication 1: Atrial Fibrillation (ICD-427.31) Lab Used: [facility] Site: [facility] INR POC 2.6 INR RANGE 2 - 3  Dietary changes: no    Health status changes: no    Bleeding/hemorrhagic complications: no    Recent/future hospitalizations: no    Any changes in medication regimen? no    Recent/future dental: no  Any missed doses?: no       Is patient compliant with meds? yes       Allergies: 1)  ! * Tetanus 2)  ! Demerol (Meperidine Hcl) 3)  ! * Toprol 4)  ! Morphine  Anticoagulation Management History:      The patient is taking warfarin and comes in today for a routine follow up visit.  Positive risk factors for bleeding include an age of 72 years or older and presence of serious comorbidities.  The bleeding index is 'intermediate risk'.  Positive CHADS2 values include History of CHF, History of HTN, and History of Diabetes.  Negative CHADS2 values include Age > 64 years old.  The start date was 01/26/1998.  Anticoagulation responsible Anita Duke: [place].  INR POC: 2.6.  Cuvette Lot#: [id number].  Exp: 07/2011.    Anticoagulation Management Assessment/Plan:      The patient's current anticoagulation dose is Coumadin 5 mg tabs: Take as directed by coumadin clinic..  The target INR is 2 - 3.  The next INR is due 08/04/2010.  Anticoagulation instructions were given to patient.  Results were reviewed/authorized by Weston Brass, PharmD.  She was notified by Weston Brass PharmD.         Prior Anticoagulation Instructions: INR 3.0  Continue taking 1/2 tablet on Tuesday and Friday and 1 tablet all other days.  May incorporate one additional serving of greens per week. Return to clinic in 4 weeks.    Current Anticoagulation Instructions: INR 2.6  Continue same dose of 1 tablet every day except 1/2 tablet on Tuesday and Friday.   Recheck INR in 4 weeks.

## 2010-10-06 NOTE — Medication Information (Signed)
Summary: rov/sp  Anticoagulant Therapy  Managed by: Weston Brass, PharmD Referring MD: [name] MD PCP: Marisue Brooklyn, DO Supervising MD: Gala Romney MD, Reuel Boom Indication 1: Atrial Fibrillation (ICD-427.31) Lab Used: [facility] Site: [facility] INR POC 4.0 INR RANGE 2 - 3  Dietary changes: yes       Details: decreased green vegetables  Health status changes: no    Bleeding/hemorrhagic complications: no    Recent/future hospitalizations: no    Any changes in medication regimen? no    Recent/future dental: no  Any missed doses?: no       Is patient compliant with meds? yes       Allergies: 1)  ! * Tetanus 2)  ! Demerol (Meperidine Hcl) 3)  ! * Toprol 4)  ! Morphine  Anticoagulation Management History:      The patient is taking warfarin and comes in today for a routine follow up visit.  Positive risk factors for bleeding include an age of 71 years or older and presence of serious comorbidities.  The bleeding index is 'intermediate risk'.  Positive CHADS2 values include History of CHF, History of HTN, and History of Diabetes.  Negative CHADS2 values include Age > 71 years old.  The start date was 01/26/1998.  Anticoagulation responsible provider: Bensimhon MD, Reuel Boom.  INR POC: 4.0.  Cuvette Lot#: [id number].  Exp: 06/2011.    Anticoagulation Management Assessment/Plan:      The patient's current anticoagulation dose is Coumadin 5 mg tabs: Take as directed by coumadin clinic..  The target INR is 2 - 3.  The next INR is due 09/01/2010.  Anticoagulation instructions were given to patient.  Results were reviewed/authorized by Weston Brass, PharmD.  She was notified by Weston Brass PharmD.         Prior Anticoagulation Instructions: INR 2.6  Continue same dose of 1 tablet every day except 1/2 tablet on Tuesday and Friday.  Recheck INR in 4 weeks.   Current Anticoagulation Instructions: INR 4.0  Skip today's dose of Coumadin then resume same dose of 1 tablet every day except  1/2 tablet on Tuesday and Friday.  Recheck INR in 3 weeks.

## 2010-10-06 NOTE — Progress Notes (Signed)
Summary: carvedilol  Phone Note Refill Request Call back at [phone] Message from:  Patient on Jan 20, 2010 8:37 AM  Refills Requested: Medication #1:  CARVEDILOL 6.25 MG TABS Take one tablet by mouth twice a day.   Supply Requested: 1 year   Notes: 90 day supply [facility], please call pt after sent   Method Requested: Fax to Mail Away Pharmacy Initial call taken by: Migdalia Dk,  Jan 20, 2010 8:38 AM  Follow-up for Phone Call        called pt  Follow-up by: Hardin Negus, RMA,  Jan 20, 2010 4:33 PM    Prescriptions: CARVEDILOL 6.25 MG TABS (CARVEDILOL) Take one tablet by mouth twice a day  #180 x 3   Entered by:   Hardin Negus, RMA   Authorized by:   Talitha Givens, MD, [facility]   Signed by:   Hardin Negus, RMA on 01/20/2010   Method used:   Electronically to        [facility]* (mail-order)             ,          Ph: [phone]       Fax: [phone]   RxID:   [id number]

## 2010-10-06 NOTE — Miscellaneous (Signed)
  Clinical Lists Changes  Observations: Added new observation of ECHOINTERP: -------------------------------------------------------------------     Study Conclusions            - Left ventricle: The cavity size was normal. Wall thickness was       normal. Systolic function was normal. The estimated ejection       fraction was in the range of 55% to 60%.     - Mitral valve: MV is thickened, calcified (especially posterior       leaflet) with restricted motion. Peak and mean gradients through       toeh valve are 11 and 3 mm Hg respectively. MVA by pressure t 1/2       is 2.2 cm 2 consistent with mild MS,     - Left atrium: The atrium was moderately to severely dilated.     - Right atrium: The atrium was mildly dilated.        (02/07/2010 11:26) Added new observation of CARDCATHFIND:   IMPRESSION: 1. Global left ventricular systolic dysfunction. 2. Mild nonobstructive coronary artery disease. 3. No evidence of constrictive pericarditis. 4. Mild-to-moderate mitral stenosis with grading of 10 mmHg and the     mitral valve area estimated at 1.42 sq. cm.   RECOMMENDATIONS:  We will continue medical management for now.  We will resume her heparin drip 6 hours.  N.p.o. at midnight for TEE-guided cardioversion in a.m.     (10/18/2009 11:28)      Echocardiogram  Procedure date:  02/07/2010  Findings:      -------------------------------------------------------------------     Study Conclusions            - Left ventricle: The cavity size was normal. Wall thickness was       normal. Systolic function was normal. The estimated ejection       fraction was in the range of 55% to 60%.     - Mitral valve: MV is thickened, calcified (especially posterior       leaflet) with restricted motion. Peak and mean gradients through       toeh valve are 11 and 3 mm Hg respectively. MVA by pressure t 1/2       is 2.2 cm 2 consistent with mild MS,     - Left atrium: The atrium was moderately to  severely dilated.     - Right atrium: The atrium was mildly dilated.         Cardiac Cath  Procedure date:  10/18/2009  Findings:        IMPRESSION: 1. Global left ventricular systolic dysfunction. 2. Mild nonobstructive coronary artery disease. 3. No evidence of constrictive pericarditis. 4. Mild-to-moderate mitral stenosis with grading of 10 mmHg and the     mitral valve area estimated at 1.42 sq. cm.   RECOMMENDATIONS:  We will continue medical management for now.  We will resume her heparin drip 6 hours.  N.p.o. at midnight for TEE-guided cardioversion in a.m.

## 2010-10-06 NOTE — Medication Information (Signed)
Summary: rov/tm  Anticoagulant Therapy  Managed by: Shelby Dubin, PharmD, BCPS, CPP Referring MD: [name] MD PCP: Marisue Brooklyn, DO Supervising MD: Myrtis Ser MD, Tinnie Gens Indication 1: Atrial Fibrillation (ICD-427.31) Lab Used: [facility] Site: [facility] INR POC 3.7 INR RANGE 2 - 3  Dietary changes: no    Health status changes: no    Bleeding/hemorrhagic complications: no    Recent/future hospitalizations: no    Any changes in medication regimen? no    Recent/future dental: no  Any missed doses?: no       Is patient compliant with meds? yes      Comments: Seeing JKatz today  Current Medications (verified): 1)  Actos 45 Mg Tabs (Pioglitazone Hcl) .Marland Kitchen.. 1 Tablet By Mouth Once Daily 2)  Coumadin 5 Mg Tabs (Warfarin Sodium) .... Take As Directed By Coumadin Clinic. 3)  Furosemide 40 Mg Tabs (Furosemide) .Marland Kitchen.. 1 Tablet By Mouth Once Daily 4)  Klor-Con 8 Meq Cr-Tabs (Potassium Chloride) .... One Tablet Three Times A Day 5)  Aspirin 81 Mg Tbec (Aspirin) .... One Tablet By Mouth Once Daily 6)  Effexor 37.5 Mg Tabs (Venlafaxine Hcl) .Marland Kitchen.. 1 Tablet By Mouth Once Daily 7)  Fish Oil  Oil (Fish Oil) .Marland Kitchen.. 1000 Mg Two Times A Day 8)  Calcium 600 1500 Mg Tabs (Calcium Carbonate) .Marland Kitchen.. 1 Tablet Two Times A Day 9)  Lipitor 40 Mg Tabs (Atorvastatin Calcium) .... Take 1 Tablet By Mouth Once A Day 10)  Glimepiride 2 Mg Tabs (Glimepiride) .... 1/2 Tablet By Mouth Once Daily 11)  Mavik 4 Mg Tabs (Trandolapril) .... Take 2 Tablets By Mouth Once A Day 12)  Vitamin D3 1000 Unit Caps (Cholecalciferol) .... Take 4 Tablets By Mouth Once Daily 13)  Tikosyn 500 Mcg Caps (Dofetilide) .... Take One Capsule By Mouth Twice A Day  Allergies: 1)  ! * Tetanus 2)  ! Demerol (Meperidine Hcl) 3)  ! * Toprol 4)  ! Morphine  Anticoagulation Management History:      The patient is taking warfarin and comes in today for a routine follow up visit.  Positive risk factors for bleeding include an age of 71 years  or older and presence of serious comorbidities.  The bleeding index is 'intermediate risk'.  Positive CHADS2 values include History of CHF, History of HTN, and History of Diabetes.  Negative CHADS2 values include Age > 11 years old.  The start date was 01/26/1998.  Anticoagulation responsible provider: Myrtis Ser MD, Tinnie Gens.  INR POC: 3.7.  Cuvette Lot#: [id number].  Exp: 01/2011.    Anticoagulation Management Assessment/Plan:      The patient's current anticoagulation dose is Coumadin 5 mg tabs: Take as directed by coumadin clinic..  The target INR is 2 - 3.  The next INR is due 11/30/2009.  Anticoagulation instructions were given to patient.  Results were reviewed/authorized by Shelby Dubin, PharmD, BCPS, CPP.  She was notified by Shelby Dubin PharmD, BCPS, CPP.         Prior Anticoagulation Instructions: INR 1.98 LMOM Bethena Midget, RN, BSN  October 12, 2009 1:34 PM Spoke with pt she is aware to change dose to 5mg s daily except 2.5mg s on Tues and Sat. Recheck in 2 weeks.   Current Anticoagulation Instructions: INR 3.7  Skip 1 day then 0.5 tab each Tuesday, Thursday, Saturday and 1 tab on all other days.   Recheck in 2 - 3 weeks.

## 2010-10-06 NOTE — Medication Information (Signed)
Summary: rov/tm  Anticoagulant Therapy  Managed by: Bethena Midget, RN, BSN Referring MD: [name] MD PCP: Marisue Brooklyn, DO Supervising MD: Eden Emms MD, Theron Arista Indication 1: Atrial Fibrillation (ICD-427.31) Lab Used: [facility] Site: [facility] INR POC 2.9 INR RANGE 2 - 3  Dietary changes: no    Health status changes: no    Bleeding/hemorrhagic complications: no    Recent/future hospitalizations: no    Any changes in medication regimen? no    Recent/future dental: no  Any missed doses?: no       Is patient compliant with meds? yes       Allergies: 1)  ! * Tetanus 2)  ! Demerol (Meperidine Hcl) 3)  ! * Toprol 4)  ! Morphine  Anticoagulation Management History:      The patient is taking warfarin and comes in today for a routine follow up visit.  Positive risk factors for bleeding include an age of 70 years or older and presence of serious comorbidities.  The bleeding index is 'intermediate risk'.  Positive CHADS2 values include History of CHF, History of HTN, and History of Diabetes.  Negative CHADS2 values include Age > 71 years old.  The start date was 01/26/1998.  Anticoagulation responsible provider: Eden Emms MD, Theron Arista.  INR POC: 2.9.  Cuvette Lot#: [id number].  Exp: 04/2011.    Anticoagulation Management Assessment/Plan:      The patient's current anticoagulation dose is Coumadin 5 mg tabs: Take as directed by coumadin clinic..  The target INR is 2 - 3.  The next INR is due 04/07/2010.  Anticoagulation instructions were given to patient.  Results were reviewed/authorized by Bethena Midget, RN, BSN.  She was notified by Dillard Cannon.         Prior Anticoagulation Instructions: INR 2.4 Continue 5mg  everyday except 2.5mg s on Tuesdays. Recheck in 4 weeks.   Current Anticoagulation Instructions: INR 2.9  Continue 0.5 tab on Tuesday and 1 tab all other days.  Recheck INR in 4 weeks.

## 2010-10-06 NOTE — Progress Notes (Signed)
Summary: does pt need sooner appt  Phone Note Call from Patient Call back at [phone]   Caller: Patient Reason for Call: Talk to Nurse, Talk to Doctor Summary of Call: per pt call her coumadin level was 2.49 and she wants to know if she needs to be seen sooner Initial call taken by: Omer Jack,  October 26, 2009 1:00 PM  Follow-up for Phone Call        pt sch for [facility] 2/24 and Dr Myrtis Ser 3/8, pt d/c'd from hospital today, INR this AM was 2.49 needs to know if she should keep cv[facility] appt 2/24 or if it can be resch out, will send to [facility] to clarify Meredith Staggers, RN  October 26, 2009 2:18 PM   Additional Follow-up for Phone Call Additional follow up Details #1::        Called spoke with pt's husband.  Cardizem changed from 240 to 120, and pt was started on Tikosyn 500mg  2 daily.  Pt was discharged from hospital on same home dosage of coumadin INR at discharge was 2.49.  R/S appt on 10/28/09 to 11/09/09 at 3:15pm prior to 3:45pm appt with Dr Myrtis Ser. Additional Follow-up by: Cloyde Reams RN,  October 26, 2009 4:32 PM

## 2010-10-06 NOTE — Medication Information (Signed)
Summary: cvrr  Anticoagulant Therapy  Managed by: Bethena Midget, RN, BSN Referring MD: [name] MD PCP: Marisue Brooklyn, DO Supervising MD: Clifton James MD, Cristal Deer Indication 1: Atrial Fibrillation (ICD-427.31) Lab Used: [facility] Site: [facility] INR POC 2.7 INR RANGE 2 - 3  Dietary changes: no    Health status changes: yes       Details: See below  Bleeding/hemorrhagic complications: no    Recent/future hospitalizations: no    Any changes in medication regimen? yes       Details: Prescribed Norco 5-325 last week for back injury  Recent/future dental: no  Any missed doses?: no       Is patient compliant with meds? yes      Comments: Injured back 3 weeks ago, saw MD last week  Allergies: 1)  ! * Tetanus 2)  ! Demerol (Meperidine Hcl) 3)  ! * Toprol 4)  ! Morphine  Anticoagulation Management History:      The patient is taking warfarin and comes in today for a routine follow up visit.  Positive risk factors for bleeding include an age of 70 years or older and presence of serious comorbidities.  The bleeding index is 'intermediate risk'.  Positive CHADS2 values include History of CHF, History of HTN, and History of Diabetes.  Negative CHADS2 values include Age > 22 years old.  The start date was 01/26/1998.  Anticoagulation responsible provider: Clifton James MD, Cristal Deer.  INR POC: 2.7.  Cuvette Lot#: [id number].  Exp: 06/2011.    Anticoagulation Management Assessment/Plan:      The patient's current anticoagulation dose is Coumadin 5 mg tabs: Take as directed by coumadin clinic..  The target INR is 2 - 3.  The next INR is due 04/27/2010.  Anticoagulation instructions were given to patient.  Results were reviewed/authorized by Bethena Midget, RN, BSN.  She was notified by Liana Gerold, PharmD Candidate.         Prior Anticoagulation Instructions: INR 2.9  Continue 0.5 tab on Tuesday and 1 tab all other days.  Recheck INR in 4 weeks.  Current Anticoagulation  Instructions: INR 2.7  Continue Coumadin 1 tablet daily except 1/2 tablet on Tuesdays. Return to clinic in 4 weeks.

## 2010-10-06 NOTE — Assessment & Plan Note (Signed)
Summary: rov   Primary Provider:  Marisue Brooklyn, DO  CC:  pt complains of tightness in chest and sob.  History of Present Illness: 70 year old female followed by Dr. Myrtis Ser. She has a history of mitral valve repair and maze procedure at the [facility] in 2001. She also had a LIMA to the LAD. She has continued to have paroxysmal atrial fibrillation. Last echocardiogram in April of 2009 showed normal LV function. She is status post mitral valve repair or with trivial mitral regurgitation. The left atrium was moderately dilated and the right atrium is mildly dilated. She was last seen in this office in July of 2010 and was in sinus rhythm at that time. She was seen by her primary care physician for sinusitis and noted to be in atrial fibrillation with a rapid ventricular response and she was referred to our office as an add on. Note her INR was 1.98 on October 12, 2009. The patient does not have dyspnea on exertion, orthopnea, PND, pedal edema, chest pain or syncope. 5 days ago she developed URI-type symptoms. She complains of congestion, cough and increased shortness of breath. She has also noticed an elevation in her heart rate and palpitations. There's been no syncope. She also notices a continuous chest tightness for the past 5 days. She was seen by her primary care physician 2 days ago and started on a Z-Pak. However her symptoms did not improve and she returned today and was given Avelox. They noticed that her heart rate was elevated and asked for [name] to evaluate the patient.  Current Medications (verified): 1)  Actos 45 Mg Tabs (Pioglitazone Hcl) .Marland Kitchen.. 1 Tablet By Mouth Once Daily 2)  Coumadin 5 Mg Tabs (Warfarin Sodium) .... Take As Directed By Coumadin Clinic. 3)  Diltiazem Hcl Cr 240 Mg Xr24h-Cap (Diltiazem Hcl) .... Two Times A Day 4)  Furosemide 40 Mg Tabs (Furosemide) .Marland Kitchen.. 1 Tablet By Mouth Once Daily 5)  Klor-Con 8 Meq Cr-Tabs (Potassium Chloride) .... One Tablet Three Times A Day 6)  Aspirin  81 Mg Tbec (Aspirin) .... One Tablet By Mouth Once Daily 7)  Effexor 37.5 Mg Tabs (Venlafaxine Hcl) .Marland Kitchen.. 1 Tablet By Mouth Once Daily 8)  Fish Oil  Oil (Fish Oil) .Marland Kitchen.. 1000 Mg Two Times A Day 9)  Calcium 600 1500 Mg Tabs (Calcium Carbonate) .Marland Kitchen.. 1 Tablet Two Times A Day 10)  Lipitor 40 Mg Tabs (Atorvastatin Calcium) .... Take 1 Tablet By Mouth Once A Day 11)  Glimepiride 2 Mg Tabs (Glimepiride) .... 1/2 Tablet By Mouth Once Daily 12)  Norvasc 5 Mg Tabs (Amlodipine Besylate) .... Take 1 Tablet By Mouth Once A Day 13)  Lanoxin 0.25 Mg Tabs (Digoxin) .... Take 1 Tablet By Mouth Once A Day 14)  Mavik 4 Mg Tabs (Trandolapril) .... Take 2 Tablets By Mouth Once A Day 15)  Vitamin D3 1000 Unit Caps (Cholecalciferol) .... Take 4 Tablets By Mouth Once Daily  Allergies: 1)  ! * Tetanus 2)  ! Demerol (Meperidine Hcl) 3)  ! * Toprol 4)  ! 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  Past Surgical History: Reviewed history from 03/02/2009 and no  changes required. * MAZE PROCEDURE, HX OF MITRAL VALVE REPLACEMENT, HX OF (ICD-V15.1) HYSTERECTOMY (ICD-V88.01) CORONARY ARTERY BYPASS GRAFT, HX OF (ICD-V45.81) CHOLECYSTECTOMY, HX OF (ICD-V45.79) Multiple Breast Lumpectomy  Family History: Reviewed history from 07/13/2008 and no changes required. Family History of Diabetes: Mother, Father, Sister, Paternal Grandparents Maternal Grandfather Cancer ? type--deceased Family History of Heart Disease: Father, Dennie Bible. Uncles, Sister  Social  History: Reviewed history from 07/13/2008 and no changes required. Married,  Occupation: Field seismologist Patient has never smoked.  Alcohol Use - no Daily Caffeine Use Illicit Drug Use - no Patient does not get regular exercise.   Review of Systems       Recent sinusitis/ URI but no hemoptysis, dysphasia, odynophagia, melena, hematochezia, dysuria, hematuria, rash, seizure activity, orthopnea, PND, pedal edema, claudication. Remaining systems are negative.   Vital Signs:  Patient profile:   70 year old female Height:      62 inches Weight:      176 pounds BMI:     32.31 Pulse rate:   123 / minute Pulse rhythm:   irregular Resp:     14 per minute BP sitting:   161 / 89  (left arm) Cuff size:   large  Vitals Entered By: Kem Parkinson (October 13, 2009 12:24 PM)  Physical Exam  General:  Well developed/well nourished in mild distress Skin warm/dry Patient not depressed No peripheral clubbing Back-normal HEENT-normal/normal eyelids Neck supple/normal carotid upstroke bilaterally; no bruits; no JVD; no thyromegaly chest - CTA/ normal expansion CV - irregular, tachycardic, no murmurs Abdomen -NT/ND, no HSM, no mass, + bowel sounds, no bruit 2+ femoral pulses, no bruits Ext-no edema, chords, 2+ DP Neuro-grossly nonfocal     EKG  Procedure date:  10/13/2009  Findings:      Atrial fibrillation at a rate of 123. Axis normal. Nonspecific ST changes.  Impression & Recommendations:  Problem # 1:  ATRIAL FIBRILLATION, HX OF (ICD-V12.59) Patient is in recurrent atrial fibrillation with a rapid ventricular response. Will admit to telemetry. Continue Cardizem and digoxin. Add Lopressor to help with rate control. Continue Coumadin with goal INR 2-3. Check echocardiogram and TSH. Recurrent atrial fibrillation may be related to upper respiratory infection. Once this is treated with plan to proceed with cardioversion once INR therapeutic for 3 consecutive weeks. The following  medications were removed from the medication list:    Metoprolol Tartrate 25 Mg Tabs (Metoprolol tartrate) .Marland Kitchen... Take one tablet by mouth twice a day Her updated medication list for this problem includes:    Coumadin 5 Mg Tabs (Warfarin sodium) .Marland Kitchen... Take as directed by coumadin clinic.    Diltiazem Hcl Cr 240 Mg Xr24h-cap (Diltiazem hcl) .Marland Kitchen..Marland Kitchen Two times a day    Aspirin 81 Mg Tbec (Aspirin) ..... One tablet by mouth once daily    Norvasc 5 Mg Tabs (Amlodipine besylate) .Marland Kitchen... Take 1 tablet by mouth once a day    Mavik 4 Mg Tabs (Trandolapril) .Marland Kitchen... Take 2 tablets by mouth once a day  Orders: T-2 View CXR ([id number])  Problem # 2:  URI (ICD-465.9) Patient complains of significant congestion. Will begin IV antibiotics.  Problem # 3:  CAD (ICD-414.00) Continue aspirin and statin. Cycle enzymes. Note she was describing chest tightness but this is more likely related to upper respiratory infection. The following medications were removed from the medication list:    Metoprolol Tartrate 25 Mg Tabs (Metoprolol tartrate) .Marland Kitchen... Take one tablet by mouth twice a day Her updated medication list for this problem includes:  Coumadin 5 Mg Tabs (Warfarin sodium) .Marland Kitchen... Take as directed by coumadin clinic.    Diltiazem Hcl Cr 240 Mg Xr24h-cap (Diltiazem hcl) .Marland Kitchen..Marland Kitchen Two times a day    Aspirin 81 Mg Tbec (Aspirin) ..... One tablet by mouth once daily    Norvasc 5 Mg Tabs (Amlodipine besylate) .Marland Kitchen... Take 1 tablet by mouth once a day    Mavik 4 Mg Tabs (Trandolapril) .Marland Kitchen... Take 2 tablets by mouth once a day  Problem # 4:  DIABETES MELLITUS (ICD-250.00)  Her updated medication list for this problem includes:    Actos 45 Mg Tabs (Pioglitazone hcl) .Marland Kitchen... 1 tablet by mouth once daily    Aspirin 81 Mg Tbec (Aspirin) ..... One tablet by mouth once daily    Glimepiride 2 Mg Tabs (Glimepiride) .Marland Kitchen... 1/2 tablet by mouth once daily    Mavik 4 Mg Tabs (Trandolapril) .Marland Kitchen... Take 2 tablets by mouth once a day  Problem #  5:  MITRAL VALVE REPLACEMENT, HX OF (ICD-V15.1) Check echocardiogram. Continue SBE prophylaxis.  Problem # 6:  COUMADIN THERAPY (ICD-V58.61)  Problem # 7:  HYPERLIPIDEMIA (ICD-272.4) Continue statin. Her updated medication list for this problem includes:    Lipitor 40 Mg Tabs (Atorvastatin calcium) .Marland Kitchen... Take 1 tablet by mouth once a day  Problem # 8:  HYPERTENSION (ICD-401.9) Blood pressure elevated. Add Lopressor as described above both for blood pressure and rate control. The following medications were removed from the medication list:    Metoprolol Tartrate 25 Mg Tabs (Metoprolol tartrate) .Marland Kitchen... Take one tablet by mouth twice a day Her updated medication list for this problem includes:    Diltiazem Hcl Cr 240 Mg Xr24h-cap (Diltiazem hcl) .Marland Kitchen..Marland Kitchen Two times a day    Furosemide 40 Mg Tabs (Furosemide) .Marland Kitchen... 1 tablet by mouth once daily    Aspirin 81 Mg Tbec (Aspirin) ..... One tablet by mouth once daily    Norvasc 5 Mg Tabs (Amlodipine besylate) .Marland Kitchen... Take 1 tablet by mouth once a day    Mavik 4 Mg Tabs (Trandolapril) .Marland Kitchen... Take 2 tablets by mouth once a day Prescriptions: METOPROLOL TARTRATE 25 MG TABS (METOPROLOL TARTRATE) Take one tablet by mouth twice a day  #60 x 12   Entered by:   Deliah Goody, RN   Authorized by:   Ferman Hamming, MD, [facility]   Signed by:   Deliah Goody, RN on 10/13/2009   Method used:   Electronically to        [facility] [address]* (retail)       [address]       [place], [place]  [address]       Ph: [phone]       Fax: [phone]   RxID:   [id number]

## 2010-10-06 NOTE — Miscellaneous (Signed)
  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 Added new problem of PULMONARY HYPERTENSION (ICD-416.8) Added new problem of BRADYCARDIA (ICD-427.89) Added new problem of ATRIAL TACHYCARDIA (ICD-427.89) Observations: Added new observation of PAST MED HX: DIABETES MELLITUS (ICD-250.00) ATRIAL FIBRILLATION, HX OF (ICD-V12.59) cured with Maze /  recurrent February,2011.Marland KitchenMarland KitchenTEE cardioversion 10/19/2009 Atrial tachycardia.Marland KitchenMarland KitchenFebruary,2011.Marland KitchenMarland KitchenMarland KitchenTikosyn started in hospital   Bradycardia   in hospital post cardioversion .Marland KitchenMarland Kitchen2/2011...dig/cardizem/metorolol stopped  (may need pacer) Coumadin  continued electively post Maze Atrial appendage removed (or tied off) at surgery  (proven by [facility] 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 EF 30%  echo and TEE.Marland Kitchen.  newly found 10/2009...etiology not clear (not CAD...see cath)...consider DM, Idiopathic, Myocarditis, (not thyroid) consider rate related with atrial tachycardia...can not use carvedilol..Use meds and follow...no constriction at cath 10/2009 ICD or PTVDP may be needed at some point after f/u after 10/2009 admission CHF   mild in  hospital..Marland Kitchen2/2011 Pulmonary illness...hospital ..Marland Kitchen2/2011.. Dr. Craige Cotta...received antibiotics and meds (not steroids).. May be return of old allergies....cough and rhinitis... (11/08/2009 19:52) Added new observation of PRIMARY MD: Marisue Brooklyn, DO (11/08/2009 19:52)       Past History:  Past Medical History: DIABETES MELLITUS (ICD-250.00) ATRIAL FIBRILLATION, HX OF (ICD-V12.59) cured with Maze /  recurrent February,2011.Marland KitchenMarland KitchenTEE cardioversion 10/19/2009 Atrial tachycardia.Marland KitchenMarland KitchenFebruary,2011.Marland KitchenMarland KitchenMarland KitchenTikosyn started in hospital   Bradycardia   in hospital post cardioversion .Marland KitchenMarland Kitchen2/2011...dig/cardizem/metorolol stopped  (may need pacer) Coumadin  continued electively post Maze Atrial appendage removed (or tied off) at surgery  (proven by [facility] 10/2009 Mitral regurgitation (treated with mitral repair) Pulmonary hypertension.Marland KitchenMarland KitchenMarland Kitchen   echo.Marland Kitchen.10/2009 RV dysfunction...mild/mod...echo 10/2009 TR  moderate...echo..10/2009 Mitral Valve repair .Marland Kitchen [facility] with Maze procedure Mitral stenosis.....mild .Marland KitchenMarland Kitchen2/2011....post MV repair CAD....cath 10/2009 CABG  [place] to LAD at time of mitral valve repair / LIMA atretic .Marland Kitchen.cath...10/2009 HYPERLIPIDEMIA (ICD-272.4) HYPERTENSION (ICD-401.9) 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 EF 30%  echo and TEE.Marland Kitchen.  newly found 10/2009...etiology not clear (not CAD...see cath)...consider DM, Idiopathic, Myocarditis, (not thyroid) consider rate related with atrial tachycardia...can not use carvedilol..Use meds and follow...no constriction at cath 10/2009 ICD or PTVDP may be needed at some point after f/u after 10/2009 admission CHF   mild in hospital..Marland Kitchen2/2011 Pulmonary illness...hospital ..Marland Kitchen2/2011.. Dr. Craige Cotta...received antibiotics and meds (not steroids).. May be return of old allergies....cough and rhinitis.Marland KitchenMarland Kitchen

## 2010-10-06 NOTE — Medication Information (Signed)
Summary: rov/tm  Anticoagulant Therapy  Managed by: Charolotte Eke, PharmD Referring MD: [name] MD PCP: Marisue Brooklyn, DO Supervising MD: Ladona Ridgel MD, Sharlot Gowda Indication 1: Atrial Fibrillation (ICD-427.31) Lab Used: [facility] Site: [facility] INR POC 2 INR RANGE 2 - 3  Dietary changes: no    Health status changes: no    Bleeding/hemorrhagic complications: no    Recent/future hospitalizations: no    Any changes in medication regimen? no    Recent/future dental: no  Any missed doses?: no       Is patient compliant with meds? yes       Current Medications (verified): 1)  Actos 45 Mg Tabs (Pioglitazone Hcl) .Marland Kitchen.. 1 Tablet By Mouth Once Daily 2)  Coumadin 5 Mg Tabs (Warfarin Sodium) .... Take As Directed By Coumadin Clinic. 3)  Diltiazem Hcl Cr 240 Mg Xr24h-Cap (Diltiazem Hcl) .... Two Times A Day 4)  Furosemide 40 Mg Tabs (Furosemide) .Marland Kitchen.. 1 Tablet By Mouth Once Daily 5)  Klor-Con 8 Meq Cr-Tabs (Potassium Chloride) .... One Tablet Three Times A Day 6)  Aspirin 81 Mg Tbec (Aspirin) .... One Tablet By Mouth Once Daily 7)  Effexor 37.5 Mg Tabs (Venlafaxine Hcl) .Marland Kitchen.. 1 Tablet By Mouth Once Daily 8)  Fish Oil  Oil (Fish Oil) .Marland Kitchen.. 1000 Mg Two Times A Day 9)  Calcium 600 1500 Mg Tabs (Calcium Carbonate) .Marland Kitchen.. 1 Tablet Two Times A Day 10)  Lipitor 40 Mg Tabs (Atorvastatin Calcium) .... Take 1 Tablet By Mouth Once A Day 11)  Glimepiride 2 Mg Tabs (Glimepiride) .... 1/2 Tablet By Mouth Once Daily 12)  Norvasc 5 Mg Tabs (Amlodipine Besylate) .... Take 1 Tablet By Mouth Once A Day 13)  Lanoxin 0.25 Mg Tabs (Digoxin) .... Take 1 Tablet By Mouth Once A Day 14)  Mavik 4 Mg Tabs (Trandolapril) .... Take 2 Tablets By Mouth Once A Day 15)  Vitamin D3 1000 Unit Caps (Cholecalciferol) .... Take 4 Tablets By Mouth Once Daily  Allergies (verified): 1)  ! * Tetanus 2)  ! Demerol (Meperidine Hcl) 3)  ! * Toprol 4)  ! Morphine  Anticoagulation Management History:      The patient is  taking warfarin and comes in today for a routine follow up visit.  Positive risk factors for bleeding include an age of 71 years or older and presence of serious comorbidities.  The bleeding index is 'intermediate risk'.  Positive CHADS2 values include History of HTN and History of Diabetes.  Negative CHADS2 values include Age > 71 years old.  The start date was 01/26/1998.  Anticoagulation responsible provider: Ladona Ridgel MD, Sharlot Gowda.  INR POC: 2.  Cuvette Lot#: [id number].  Exp: 12/2010.    Anticoagulation Management Assessment/Plan:      The patient's current anticoagulation dose is Coumadin 5 mg tabs: Take as directed by coumadin clinic..  The target INR is 2 - 3.  The next INR is due 10/14/2009.  Anticoagulation instructions were given to patient.  Results were reviewed/authorized by Charolotte Eke, PharmD.  She was notified by Charolotte Eke, PharmD.         Prior Anticoagulation Instructions: INR 2.6 Continue 5mg s daily except 2.5mg s on Tuesdays, Thursdays and Saturdays. Recheck in 4 weeks.   Current Anticoagulation Instructions: The patient is to continue with the same dose of coumadin.  This dosage includes:

## 2010-10-06 NOTE — Letter (Signed)
Summary: [facility] [place] Physician Statement Form  [employer] Physician Statement Form   Imported By: Roderic Ovens 12/21/2009 12:49:15  _____________________________________________________________________  External Attachment:    Type:   Image     Comment:   External Document

## 2010-10-06 NOTE — Letter (Signed)
Summary: Outpatient Coinsurance Notice  Outpatient Coinsurance Notice   Imported By: Marylou Mccoy 02/10/2010 11:37:43  _____________________________________________________________________  External Attachment:    Type:   Image     Comment:   External Document

## 2010-10-06 NOTE — Progress Notes (Signed)
Summary: med question  Phone Note Call from Patient Call back at Home Phone [phone]   Caller: Patient Reason for Call: Talk to Nurse Summary of Call: wants to know if she is suppose to be taken metoprolol Initial call taken by: Migdalia Dk,  October 28, 2009 1:25 PM  Follow-up for Phone Call        Do not take metoprolol  Talitha Givens, MD, [facility]  October 28, 2009 4:05 PM  pt is aware Meredith Staggers, RN  October 28, 2009 5:27 PM

## 2010-10-06 NOTE — Assessment & Plan Note (Signed)
Summary: per check out/sf   Visit Type:  Follow-up Primary Provider:  Marisue Brooklyn, DO  CC:  cardiomyopathy.  History of Present Illness: The patient is seen for followup of cardiomyopathy, mitral valve repair, atrial tachycardia, bradycardia, right ventricular dysfunction, mitral stenosis, coronary artery disease.  She is actually feeling quite well.  We have very good news regarding her left ventricular dysfunction.  Two-dimensional was done February 07, 2010.  Her ejection fraction is improved to 55-60%.  There is mild functional mitral stenosis after her mitral valve repair.  Right ventricular size and function are described as normal.  This is also a significant improvement.  She has not had any syncope or presyncope.  She mentions very brief episodes of one or 2 seconds of slight dizziness.  She's not feeling any significant palpitations.  Current Medications (verified): 1)  Actos 30 Mg Tabs (Pioglitazone Hcl) .... Take 1 Tab Daily 2)  Coumadin 5 Mg Tabs (Warfarin Sodium) .... Take As Directed By Coumadin Clinic. 3)  Furosemide 40 Mg Tabs (Furosemide) .Marland Kitchen.. 1 Tablet By Mouth Once Daily 4)  Klor-Con 8 Meq Cr-Tabs (Potassium Chloride) .... One Tablet Three Times A Day 5)  Aspirin 81 Mg Tbec (Aspirin) .... One Tablet By Mouth Once Daily 6)  Effexor 37.5 Mg Tabs (Venlafaxine Hcl) .Marland Kitchen.. 1 Tablet By Mouth Once Daily 7)  Fish Oil  Oil (Fish Oil) .Marland Kitchen.. 1000 Mg Two Times A Day 8)  Calcium 600 1500 Mg Tabs (Calcium Carbonate) .Marland Kitchen.. 1 Tablet Two Times A Day 9)  Lipitor 40 Mg Tabs (Atorvastatin Calcium) .... Take 1 Tablet By Mouth Once A Day 10)  Glimepiride 2 Mg Tabs (Glimepiride) .... 1/2 Tablet By Mouth Once Daily 11)  Mavik 4 Mg Tabs (Trandolapril) .... Take 2 Tablets By Mouth Once A Day 12)  Vitamin D3 1000 Unit Caps (Cholecalciferol) .... Take 4 Tablets By Mouth Once Daily 13)  Tikosyn 500 Mcg Caps (Dofetilide) .... Take One Capsule By Mouth Twice A Day 14)  Carvedilol 6.25 Mg Tabs (Carvedilol) ....  Take One Tablet By Mouth Twice A Day  Allergies (verified): 1)  ! * Tetanus 2)  ! Demerol (Meperidine Hcl) 3)  ! * Toprol 4)  ! Morphine  Past History:  Past Medical History: DIABETES MELLITUS (ICD-250.00) ATRIAL FIBRILLATION, HX OF (ICD-V12.59) cured with Maze /  recurrent February,2011.Marland KitchenMarland KitchenTEE cardioversion 10/19/2009 Atrial tachycardia.Marland KitchenMarland KitchenFebruary,2011.Marland KitchenMarland KitchenMarland KitchenTikosyn started in hospital   Bradycardia   in hospital post cardioversion .Marland KitchenMarland Kitchen2/2011...dig/cardizem/metorolol stopped  (may need pacer) Coumadin  continued electively post Maze Atrial appendage removed (or tied off) at surgery  (proven by [facility] 10/2009 Mitral regurgitation (treated with mitral repair) Pulmonary hypertension.Marland KitchenMarland KitchenMarland Kitchen   echo.Marland Kitchen.10/2009 /  no mention of pulmonary hypertension... echo... June, 2011I will RV dysfunction...mild/mod...echo 10/2009 /  normalized... echo.. February 07, 2010 TR  moderate...echo..10/2009 /  trivial... echo.. 6, 2011 Mitral Valve repair .Marland Kitchen [facility] with Maze procedure Mitral stenosis.....mild .Marland KitchenMarland Kitchen2/2011....post MV repair /  mild... echo... 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 EF 30%  echo and TEE.Marland Kitchen.  newly found 10/2009...etiology not clear (not CAD...see cath)...consider DM, Idiopathic, Myocarditis, (not thyroid) consider rate related with atrial tachycardia...can not use carvedilol..Use meds and follow...no constriction at cath 10/2009 /  EF 55-60%... echo... February 07, 2010 ICD or PTVDP may be needed at some point after f/u after 10/2009 admission CHF   mild  in hospital..Marland Kitchen2/2011 Pulmonary illness...hospital ..Marland Kitchen2/2011.. Dr. Craige Cotta...received antibiotics and meds (not steroids).. May be return of old allergies....cough and rhinitis... Dizziness...  very brief 2 second episodes... June, 2011  Review of Systems       Patient denies fever, chills, headache, sweats, rash, change in vision, change in hearing, chest pain, cough, nausea vomiting, urinary symptoms.  All other systems are reviewed and are negative.  Vital Signs:  Patient profile:   70 year old female Height:      62 inches Weight:      180 pounds Pulse rate:   66 / minute BP sitting:   140 / 68  (left arm) Cuff size:   large  Vitals Entered By: Burnett Kanaris, CNA (February 17, 2010 8:48 AM)  Physical Exam  General:  patient looks quite good today. Head:  head is atraumatic. Eyes:  no xanthelasma. Neck:  no jugular venous distention. Chest Wall:  no chest wall tenderness. Lungs:  lungs are clear.  Respiratory effort is nonlabored. Heart:  cardiac exam reveals S1-S2.  No clicks or significant murmurs.  The rhythm is consistent with atrial bigeminy. Abdomen:  abdomen is soft. Msk:  no musculoskeletal deformities. Extremities:  no peripheral edema. Skin:  no skin rashes. Psych:  patient is oriented to person time and her affect is normal.   Impression & Recommendations:  Problem # 1:  CARDIOMYOPATHY (ICD-425.4)  The patient and I are both very pleased that she has had return of normal left ventricular function.  I will not change her meds at this point.  We think that the left ventricular dysfunction may have been rate related.  This will be followed.  Orders: TLB-BMP (Basic Metabolic Panel-BMET) (80048-[id number])  Problem # 2:  * MITRAL VALVE REPAIR Mitral valve repair continues to be stable.  She does have mild functional mitral stenosis that will be followed.  Problem # 3:  ATRIAL TACHYCARDIA (ICD-427.89) The patient will remain on Tikosyn for her atrial tachycardia.  Her rhythm today on physical exam is consistent with atrial bigeminy that we have seen before.  EKG is not done.  No change in her meds today.  I will consider a monitor at some point but it is not  needed now.  Problem # 4:  BRADYCARDIA (ICD-427.89) We continued to watch her rhythm for the possibility of marked bradycardia.  I don't believe this is an issue.  Problem # 5:  * RV DYSFUNCTION Fortunately right ventricular function has also improved by echo.  No further workup.  Problem # 6:  PULMONARY HYPERTENSION (ICD-416.8) There is no mention of pulmonary hypertension in the echo of June 2 011.  This also appears to have improved as her LV and RV have improved.  Problem # 7:  * DIZZINESS.. 2 SECOND EPISODES At this point I'm not convinced of a significant problem.  Her furosemide dose will be lower from 40-20 mg.  Chemistry will be checked today.  I'll see her for followup in 6 weeks.  Patient Instructions: 1)  Decrease Furosemide to 20mg  daily (1/2 tab) 2)  Lab today 3)  Follow up in 6 weeks

## 2010-10-06 NOTE — Medication Information (Signed)
Summary: rov/sp  Anticoagulant Therapy  Managed by: Cloyde Reams, RN, BSN Referring MD: [name] MD PCP: Marisue Brooklyn, DO Supervising MD: Eden Emms MD, Theron Arista Indication 1: Atrial Fibrillation (ICD-427.31) Lab Used: [facility] Site: [facility] INR POC 2.9 INR RANGE 2 - 3  Dietary changes: no    Health status changes: no    Bleeding/hemorrhagic complications: no    Recent/future hospitalizations: no    Any changes in medication regimen? yes       Details: took course of zpack before Christmas and Clarinex.   Recent/future dental: no  Any missed doses?: no       Is patient compliant with meds? yes       Allergies: 1)  ! * Tetanus 2)  ! Demerol (Meperidine Hcl) 3)  ! * Toprol 4)  ! Morphine  Anticoagulation Management History:      The patient is taking warfarin and comes in today for a routine follow up visit.  Positive risk factors for bleeding include an age of 50 years or older and presence of serious comorbidities.  The bleeding index is 'intermediate risk'.  Positive CHADS2 values include History of CHF, History of HTN, and History of Diabetes.  Negative CHADS2 values include Age > 60 years old.  The start date was 01/26/1998.  Anticoagulation responsible provider: Eden Emms MD, Theron Arista.  INR POC: 2.9.  Cuvette Lot#: [id number].  Exp: 10/2011.    Anticoagulation Management Assessment/Plan:      The patient's current anticoagulation dose is Coumadin 5 mg tabs: Take as directed by coumadin clinic..  The target INR is 2 - 3.  The next INR is due 09/29/2010.  Anticoagulation instructions were given to patient.  Results were reviewed/authorized by Cloyde Reams, RN, BSN.  She was notified by Cloyde Reams RN.         Prior Anticoagulation Instructions: INR 4.0  Skip today's dose of Coumadin then resume same dose of 1 tablet every day except 1/2 tablet on Tuesday and Friday.  Recheck INR in 3 weeks.   Current Anticoagulation Instructions: INR 2.9  Continue on same dosage  1 tablet daily except 1/2 tablet on Tuesdays and Fridays.  Recheck in 4 weeks.

## 2010-10-06 NOTE — Letter (Signed)
Summary: DMV - Application for Handicapped Placard  DMV - Application for Handicapped Placard   Imported By: Marylou Mccoy 05/17/2010 16:30:34  _____________________________________________________________________  External Attachment:    Type:   Image     Comment:   External Document

## 2010-10-06 NOTE — Medication Information (Signed)
Summary: rov/tm  Anticoagulant Therapy  Managed by: Charolotte Eke, PharmD Referring MD: [name] MD PCP: Marisue Brooklyn, DO Supervising MD: Gala Romney MD, Reuel Boom Indication 1: Atrial Fibrillation (ICD-427.31) Lab Used: [facility]  Site: [facility] INR POC 2.4 INR RANGE 2 - 3  Dietary changes: yes       Details: less greens  Health status changes: no    Bleeding/hemorrhagic complications: no    Recent/future hospitalizations: no    Any changes in medication regimen? no    Recent/future dental: no  Any missed doses?: no       Is patient compliant with meds? yes       Current Medications (verified): 1)  Actos 45 Mg Tabs (Pioglitazone Hcl) .Marland Kitchen.. 1 Tablet By Mouth Once Daily 2)  Coumadin 5 Mg Tabs (Warfarin Sodium) .... Take As Directed By Coumadin Clinic. 3)  Furosemide 40 Mg Tabs (Furosemide) .Marland Kitchen.. 1 Tablet By Mouth Once Daily 4)  Klor-Con 8 Meq Cr-Tabs (Potassium Chloride) .... One Tablet Three Times A Day 5)  Aspirin 81 Mg Tbec (Aspirin) .... One Tablet By Mouth Once Daily 6)  Effexor 37.5 Mg Tabs (Venlafaxine Hcl) .Marland Kitchen.. 1 Tablet By Mouth Once Daily 7)  Fish Oil  Oil (Fish Oil) .Marland Kitchen.. 1000 Mg Two Times A Day 8)  Calcium 600 1500 Mg Tabs (Calcium Carbonate) .Marland Kitchen.. 1 Tablet Two Times A Day 9)  Lipitor 40 Mg Tabs (Atorvastatin Calcium) .... Take 1 Tablet By Mouth Once A Day 10)  Glimepiride 2 Mg Tabs (Glimepiride) .... 1/2 Tablet By Mouth Once Daily 11)  Mavik 4 Mg Tabs (Trandolapril) .... Take 2 Tablets By Mouth Once A Day 12)  Vitamin D3 1000 Unit Caps (Cholecalciferol) .... Take 4 Tablets By Mouth Once Daily 13)  Tikosyn 500 Mcg Caps (Dofetilide) .... Take One Capsule By Mouth Twice A Day 14)  Carvedilol 6.25 Mg Tabs (Carvedilol) .... Take One Tablet By Mouth Twice A Day  Allergies (verified): 1)  ! * Tetanus 2)  ! Demerol (Meperidine Hcl) 3)  ! * Toprol 4)  ! Morphine  Anticoagulation Management History:      The patient is taking warfarin and comes in today for a  routine follow up visit.  Positive risk factors for bleeding include an age of 31 years or older and presence of serious comorbidities.  The bleeding index is 'intermediate risk'.  Positive CHADS2 values include History of CHF, History of HTN, and History of Diabetes.  Negative CHADS2 values include Age > 28 years old.  The start date was 01/26/1998.  Anticoagulation responsible provider: Catricia Scheerer MD, Reuel Boom.  INR POC: 2.4.  Cuvette Lot#: [id number].  Exp: 02/2011.    Anticoagulation Management Assessment/Plan:      The patient's current anticoagulation dose is Coumadin 5 mg tabs: Take as directed by coumadin clinic..  The target INR is 2 - 3.  The next INR is due 02/03/2010.  Anticoagulation instructions were given to patient.  Results were reviewed/authorized by Charolotte Eke, PharmD.  She was notified by Charolotte Eke, PharmD.         Prior Anticoagulation Instructions: INR 1.5 Today take 7.5mg s then change dose to 5mg s everyday except 2.5mg s on Tuesdays. Recheck in 2 weeks.   Current Anticoagulation Instructions: The patient is to continue with the same dose of coumadin.  This dosage includes: 5mg  daily except 2.5mg  on Tues.

## 2010-10-06 NOTE — Progress Notes (Signed)
  Recieved paper from [employer], for patient's FMLA forwarded to [facility] for processing [facility]  October 28, 2009 3:58 PM

## 2010-10-06 NOTE — Progress Notes (Signed)
.  Walk in Patient Form Recieved " pt needs DMV papers completed for Handicapped Sticker" sent to [employer] [employer]  March 11, 2010 8:55 AM

## 2010-10-06 NOTE — Letter (Signed)
Summary: Out of Work  [employer], Main Office  [address]   [place], [place] [address]   Phone: [phone]  Fax: [phone]        November 18, 2009 MRN: [id number]    KELLIS TOPETE [address] [place], [place]  [address]    To Whom It May Concern:  Anita Duke has been under my care for many years.  Recently, she was hospitalized with new changes in her overall status.  She had a significant pulmonary illness and was treated.  In addition she had minimally diagnosed congestive heart failure.  Her echo showed new left ventricular dysfunction.  She had significant atrial fibrillation.  She was ultimately cardioverted.  She had bradycardia.  She was started on Tikosyn.  After prolonged hospitalization she was discharged home.  I have seen her in the office since she has been at home.  She is slowly recovering.  The purpose of this letter is to document that her recent medical illness involved a great deal more than just atrial fibrillation.  She was hospitalized for a long period of time.  She is improving from congestive heart failure and pulmonary disease.  It will take time for her to fully stabilized.  It is my recommendation that she not be allowed to return to work until December 13, 2009.  Please not hesitate to contact me for any questions.         Sincerely,     Talitha Givens, MD, [facility]  This letter has been electronically signed by your physician.

## 2010-10-06 NOTE — Medication Information (Signed)
Summary: rov/jm  Anticoagulant Therapy  Managed by: Eda Keys, PharmD Referring MD: [name] MD PCP: Marisue Brooklyn, DO Supervising MD: Gala Romney MD, Reuel Boom Indication 1: Atrial Fibrillation (ICD-427.31) Lab Used: [facility] Site: [facility] INR POC 3.0 INR RANGE 2 - 3  Dietary changes: yes       Details: pt has started incorporating more greens  Health status changes: no    Bleeding/hemorrhagic complications: no    Recent/future hospitalizations: no    Any changes in medication regimen? no    Recent/future dental: no  Any missed doses?: no       Is patient compliant with meds? yes       Allergies: 1)  ! * Tetanus 2)  ! Demerol (Meperidine Hcl) 3)  ! * Toprol 4)  ! Morphine  Anticoagulation Management History:      The patient is taking warfarin and comes in today for a routine follow up visit.  Positive risk factors for bleeding include an age of 45 years or older and presence of serious comorbidities.  The bleeding index is 'intermediate risk'.  Positive CHADS2 values include History of CHF, History of HTN, and History of Diabetes.  Negative CHADS2 values include Age > 73 years old.  The start date was 01/26/1998.  Anticoagulation responsible provider: Bensimhon MD, Reuel Boom.  INR POC: 3.0.  Cuvette Lot#: [id number].  Exp: 07/2011.    Anticoagulation Management Assessment/Plan:      The patient's current anticoagulation dose is Coumadin 5 mg tabs: Take as directed by coumadin clinic..  The target INR is 2 - 3.  The next INR is due 07/07/2010.  Anticoagulation instructions were given to patient.  Results were reviewed/authorized by Eda Keys, PharmD.  She was notified by Eda Keys.         Prior Anticoagulation Instructions: INR 4.2  Skip today's dose then begin one tablet every day except one-half tablet on Tuesday and Thursday.  Return to clinic on 10/3.   Current Anticoagulation Instructions: INR 3.0  Continue taking 1/2 tablet on Tuesday and  Friday and 1 tablet all other days.  May incorporate one additional serving of greens per week. Return to clinic in 4 weeks.

## 2010-10-06 NOTE — Medication Information (Signed)
Summary: rov/tm  Anticoagulant Therapy  Managed by: Weston Brass, PharmD Referring MD: [name] MD PCP: Marisue Brooklyn, DO Supervising MD: Gala Romney MD, Reuel Boom Indication 1: Atrial Fibrillation (ICD-427.31) Lab Used: [facility] Site: [facility] INR POC 4.2 INR RANGE 2 - 3  Dietary changes: no    Health status changes: no    Bleeding/hemorrhagic complications: no    Recent/future hospitalizations: no    Any changes in medication regimen? no    Recent/future dental: no  Any missed doses?: no       Is patient compliant with meds? yes       Allergies: 1)  ! * Tetanus 2)  ! Demerol (Meperidine Hcl) 3)  ! * Toprol 4)  ! Morphine  Anticoagulation Management History:      The patient is taking warfarin and comes in today for a routine follow up visit.  Positive risk factors for bleeding include an age of 71 years or older and presence of serious comorbidities.  The bleeding index is 'intermediate risk'.  Positive CHADS2 values include History of CHF, History of HTN, and History of Diabetes.  Negative CHADS2 values include Age > 63 years old.  The start date was 01/26/1998.  Anticoagulation responsible provider: Adell Koval MD, Reuel Boom.  INR POC: 4.2.  Cuvette Lot#: [id number].  Exp: 07/2011.    Anticoagulation Management Assessment/Plan:      The patient's current anticoagulation dose is Coumadin 5 mg tabs: Take as directed by coumadin clinic..  The target INR is 2 - 3.  The next INR is due 06/06/2010.  Anticoagulation instructions were given to patient.  Results were reviewed/authorized by Weston Brass, PharmD.  She was notified by Kennieth Francois.         Prior Anticoagulation Instructions: INR 71.6 Skip today then resume. Recheck in 3 weeks.   Current Anticoagulation Instructions: INR 4.2  Skip today's dose then begin one tablet every day except one-half tablet on Tuesday and Thursday.  Return to clinic on 10/3.

## 2010-10-06 NOTE — Medication Information (Signed)
Summary: 3WK F/U COUMADIN. APPT. WITH DR Myrtis Ser AT 11:15  Anticoagulant Therapy  Managed by: Cloyde Reams, RN, BSN Referring MD: [name] MD PCP: Marisue Brooklyn, DO Supervising MD: Eden Emms MD, Theron Arista Indication 1: Atrial Fibrillation (ICD-427.31) Lab Used: [facility] Site: [facility] INR RANGE 2 - 3  Dietary changes: no    Health status changes: no    Bleeding/hemorrhagic complications: no    Recent/future hospitalizations: no    Any changes in medication regimen? no    Recent/future dental: no  Any missed doses?: no       Is patient compliant with meds? yes      Comments: Pt states she has been taking 1 tablet daily except 1/2 tablet on Tu,Th,Sa.  Allergies: 1)  ! * Tetanus 2)  ! Demerol (Meperidine Hcl) 3)  ! * Toprol 4)  ! Morphine  Anticoagulation Management History:      The patient is taking warfarin and comes in today for a routine follow up visit.  Positive risk factors for bleeding include an age of 4 years or older and presence of serious comorbidities.  The bleeding index is 'intermediate risk'.  Positive CHADS2 values include History of CHF, History of HTN, and History of Diabetes.  Negative CHADS2 values include Age > 52 years old.  The start date was 01/26/1998.  Anticoagulation responsible provider: Eden Emms MD, Theron Arista.  Cuvette Lot#: [id number].  Exp: 01/2011.    Anticoagulation Management Assessment/Plan:      The patient's current anticoagulation dose is Coumadin 5 mg tabs: Take as directed by coumadin clinic..  The target INR is 2 - 3.  The next INR is due 12/22/2009.  Anticoagulation instructions were given to patient.  Results were reviewed/authorized by Cloyde Reams, RN, BSN.  She was notified by Cloyde Reams RN.         Prior Anticoagulation Instructions: INR 3.7  Skip 1 day then 0.5 tab each Tuesday, Thursday, Saturday and 1 tab on all other days.   Recheck in 2 - 3 weeks.    Current Anticoagulation Instructions: INR 1.7  Take 1.5 tablets  today then start taking 1 tablet daily except 1/2 tablet on Tuesdays and Saturdays.  Recheck in 3 weeks.

## 2010-10-06 NOTE — Assessment & Plan Note (Signed)
Summary: this   Visit Type:  post hospitalization Primary Provider:  Marisue Brooklyn, DO  CC:  cardiomyopathy / atrial tachycardia.  History of Present Illness: The patient is seen post hospitalization for a complicated illness.  She presented with shortness of breath and rapid atrial fibrillation.  Historically, she had a Maze procedure to treat her atrial fibrillation in the past.  This was done at the time of mitral valve repair at the [facility].  When I saw her approximately one year ago she was in sinus rhythm and her valve was working well and she had good LV function.  Patient then developed problems late in 2010.  We only know that she developed left ventricular dysfunction and atrial fibrillation.  When she first came in the hospital in February, 2011 it appeared that she had any respiratory illness.  It then became more clear that she had both cardiac and respiratory problems.  TEE cardioversion was done and she converted to a slow rhythm with small p waves.  With relative bradycardia, digoxin and Cardizem and beta blockers were stopped.  She then had a burst of atrial tachycardia.  He was very clear that her rhythm needed to be treated.  She was seen by Dr.Allred and Tikosyn was started.  Her QT interval remained in the normal range and she continues on Tikosyn.  Cardiac catheterization was done.  The LIMA to her LAD was atretic.  There were no other major coronary abnormalities.  2-D echo revealed an ejection fraction of 30%.  Etiology remains unclear.  The patient also had cough and rhinitis.  She was seen by Dr.Sood, who adjusted her medicines.  She received one day of steroids only.  She improved over time.  Today she clearly feels better.  Current Medications (verified): 1)  Actos 45 Mg Tabs (Pioglitazone Hcl) .Marland Kitchen.. 1 Tablet By Mouth Once Daily 2)  Coumadin 5 Mg Tabs (Warfarin Sodium) .... Take As Directed By Coumadin Clinic. 3)  Furosemide 40 Mg Tabs (Furosemide) .Marland Kitchen.. 1 Tablet  By Mouth Once Daily 4)  Klor-Con 8 Meq Cr-Tabs (Potassium Chloride) .... One Tablet Three Times A Day 5)  Aspirin 81 Mg Tbec (Aspirin) .... One Tablet By Mouth Once Daily 6)  Effexor 37.5 Mg Tabs (Venlafaxine Hcl) .Marland Kitchen.. 1 Tablet By Mouth Once Daily 7)  Fish Oil  Oil (Fish Oil) .Marland Kitchen.. 1000 Mg Two Times A Day 8)  Calcium 600 1500 Mg Tabs (Calcium Carbonate) .Marland Kitchen.. 1 Tablet Two Times A Day 9)  Lipitor 40 Mg Tabs (Atorvastatin Calcium) .... Take 1 Tablet By Mouth Once A Day 10)  Glimepiride 2 Mg Tabs (Glimepiride) .... 1/2 Tablet By Mouth Once Daily 11)  Mavik 4 Mg Tabs (Trandolapril) .... Take 2 Tablets By Mouth Once A Day 12)  Vitamin D3 1000 Unit Caps (Cholecalciferol) .... Take 4 Tablets By Mouth Once Daily 13)  Tikosyn 500 Mcg Caps (Dofetilide) .... Take One Capsule By Mouth Twice A Day  Allergies (verified): 1)  ! * Tetanus 2)  ! Demerol (Meperidine Hcl) 3)  ! * Toprol 4)  ! Morphine  Past History:  Past Medical History: Last updated: 11/08/2009 DIABETES MELLITUS (ICD-250.00) ATRIAL FIBRILLATION, HX OF (ICD-V12.59) cured with Maze /  recurrent February,2011.Marland KitchenMarland KitchenTEE cardioversion 10/19/2009 Atrial tachycardia.Marland KitchenMarland KitchenFebruary,2011.Marland KitchenMarland KitchenMarland KitchenTikosyn started in hospital   Bradycardia   in hospital post cardioversion .Marland KitchenMarland Kitchen2/2011...dig/cardizem/metorolol stopped  (may need pacer) Coumadin  continued electively post Maze Atrial appendage removed (or tied off) at surgery  (proven by [facility] 10/2009 Mitral regurgitation (treated with mitral repair)  Pulmonary hypertension.Marland KitchenMarland KitchenMarland Kitchen   echo.Marland Kitchen.10/2009 RV dysfunction...mild/mod...echo 10/2009 TR  moderate...echo..10/2009 Mitral Valve repair .Marland Kitchen [facility] with Maze procedure Mitral stenosis.....mild .Marland KitchenMarland Kitchen2/2011....post MV repair CAD....cath 10/2009 CABG  [place] to LAD at time of mitral valve repair / LIMA atretic .Marland Kitchen.cath...10/2009 HYPERLIPIDEMIA (ICD-272.4) HYPERTENSION (ICD-401.9) INTERNAL HEMORRHOIDS (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 EF 30%  echo and TEE.Marland Kitchen.  newly found 10/2009...etiology not clear (not CAD...see cath)...consider DM, Idiopathic, Myocarditis, (not thyroid) consider rate related with atrial tachycardia...can not use carvedilol..Use meds and follow...no constriction at cath 10/2009 ICD or PTVDP may be needed at some point after f/u after 10/2009 admission CHF   mild in hospital..Marland Kitchen2/2011 Pulmonary illness...hospital ..Marland Kitchen2/2011.. Dr. Craige Cotta...received antibiotics and meds (not steroids).. May be return of old allergies....cough and rhinitis...  Review of Systems       Patient denies fever, chills, headache, sweats, rash, change in vision, change in hearing, chest pain, cough, shortness of breath, nausea or vomiting, urinary symptoms.  All other systems are reviewed and are negative.  Vital Signs:  Patient profile:   70 year old female Height:      62 inches Weight:      168 pounds BMI:     30.84 Pulse rate:   76 / minute BP sitting:   133 / 63  (left arm) Cuff size:   large  Vitals Entered By: Burnett Kanaris, CNA (November 09, 2009 3:31 PM)  Physical Exam  General:  patient is stable. Head:  head is atraumatic. Eyes:  no xanthelasma. Neck:  no carotid bruits. Chest Wall:  no chest wall tenderness. Lungs:  lungs are clear.  Respiratory effort is nonlabored. Heart:  cardiac exam reveals S1-S2.  No clicks or significant murmurs. Abdomen:  abdomen is soft. Msk:  no musculoskeletal deformities. Extremities:  no peripheral edema. Skin:  no skin rashes. Psych:  patient is oriented to person time and place.  Affect is normal.   Impression & Recommendations:  Problem # 1:  ATRIAL TACHYCARDIA (ICD-427.89) The patient's atrial fibrillation had been cardioverted.  After several days she developed atrial tachycardia that was well documented.  She does and was then started.  Problem # 2:  BRADYCARDIA (ICD-427.89) after being cardioverted to sinus rhythm,  her rate was slow.  Digoxin and beta blockers and Cardizem were stopped.  In the hospital I was not able to restart her beta blocker.  Today I have started very low dose carvedilol.  If she needs a pacemaker will be optimal to wait to see if her LV function improves.  Problem # 3:  * RV DYSFUNCTION Patient has right ventricular dysfunction and pulmonary hypertension and tricuspid regurgitation.  We will wait to see if these improve over time.  Problem # 4:  MITRAL STENOSIS (ICD-394.0) The patient has mild functional mitral stenosis after repair of her valve.  This will be followed over time.  Problem # 5:  * COUGH / RHINITIS the patient did receive antibiotics when she first came into the hospital with a full ten-day course.  Ultimately she was seen by pulmonary who felt that she had cough and rhinitis.  She received only one dose of steroids.  It was felt that some of the problem might be related to return of allergy problems that she had in the past.  She may need allergy followup.  Problem # 6:  * EF 30% The patient's ejection fraction was 30%.  This was a new diagnosis.  He was not related to  coronary disease as proven by catheterization.  Etiology could be idiopathic, myocarditis, rate related, diabetes..  The problem was not from constriction as proven in the catheter lab.  Thyroid functions were normal.  Her volume status is stable at this time.  We will repeat a 2-D echo in the future in hopes that we see improvement in LV function.  She is on an ARB.  I have started very low dose carvedilol for the first time today.  Other Orders: EKG w/ Interpretation (93000)  Patient Instructions: 1)  Start Carvedilol 3.125mg  two times a day, start by taking 1 tab daily for 1 week then increase to two times a day  2)  Follow up in 3 weeks Prescriptions: TIKOSYN 500 MCG CAPS (DOFETILIDE) Take one capsule by mouth twice a day  #180 x 3   Entered by:   Meredith Staggers, RN   Authorized by:   Talitha Givens, MD, [facility]   Signed by:   Meredith Staggers, RN on 11/09/2009   Method used:   Electronically to        [facility]* (mail-order)             ,          Ph: [phone]       Fax: [phone]   RxID:   [id number] CARVEDILOL 3.125 MG TABS (CARVEDILOL) Take one tablet by mouth twice a day  #60 x 3   Entered by:   Meredith Staggers, RN   Authorized by:   Talitha Givens, MD, [facility]   Signed by:   Meredith Staggers, RN on 11/09/2009   Method used:   Electronically to        [facility]. [phone]* (retail)       [phone]       [place], [place]  [address]       Ph: [phone]       Fax: [phone]   RxID:   [id number]    Appended Document: this EKG was done today and reviewed by me.  The patient has very small but definite P waves.  The rhythm is sinus with PACs.

## 2010-10-06 NOTE — Letter (Signed)
Summary: [employer]  [employer]   Imported By: Marylou Mccoy 02/24/2010 12:26:19  _____________________________________________________________________  External Attachment:    Type:   Image     Comment:   External Document

## 2010-10-06 NOTE — Assessment & Plan Note (Signed)
Summary: 3WK F/U   Visit Type:  Follow-up Primary Provider:  Marisue Brooklyn, DO  CC:  cardiomyopathy.  History of Present Illness: The patient returns for followup of her cardiomyopathy.  I saw her last March he, 2011.  Her complicated course as explained in my prior notes and in the past medical history.  When I saw her last we started a very low dose of carvedilol.  We have been worried about bradycardia and she is tolerated it well.  She cracked and the level is increasing.  She is tolerating Tikosyn.  Current Medications (verified): 1)  Actos 45 Mg Tabs (Pioglitazone Hcl) .Marland Kitchen.. 1 Tablet By Mouth Once Daily 2)  Coumadin 5 Mg Tabs (Warfarin Sodium) .... Take As Directed By Coumadin Clinic. 3)  Furosemide 40 Mg Tabs (Furosemide) .Marland Kitchen.. 1 Tablet By Mouth Once Daily 4)  Klor-Con 8 Meq Cr-Tabs (Potassium Chloride) .... One Tablet Three Times A Day 5)  Aspirin 81 Mg Tbec (Aspirin) .... One Tablet By Mouth Once Daily 6)  Effexor 37.5 Mg Tabs (Venlafaxine Hcl) .Marland Kitchen.. 1 Tablet By Mouth Once Daily 7)  Fish Oil  Oil (Fish Oil) .Marland Kitchen.. 1000 Mg Two Times A Day 8)  Calcium 600 1500 Mg Tabs (Calcium Carbonate) .Marland Kitchen.. 1 Tablet Two Times A Day 9)  Lipitor 40 Mg Tabs (Atorvastatin Calcium) .... Take 1 Tablet By Mouth Once A Day 10)  Glimepiride 2 Mg Tabs (Glimepiride) .... 1/2 Tablet By Mouth Once Daily 11)  Mavik 4 Mg Tabs (Trandolapril) .... Take 2 Tablets By Mouth Once A Day 12)  Vitamin D3 1000 Unit Caps (Cholecalciferol) .... Take 4 Tablets By Mouth Once Daily 13)  Tikosyn 500 Mcg Caps (Dofetilide) .... Take One Capsule By Mouth Twice A Day 14)  Carvedilol 3.125 Mg Tabs (Carvedilol) .... Take One Tablet By Mouth Twice A Day  Allergies (verified): 1)  ! * Tetanus 2)  ! Demerol (Meperidine Hcl) 3)  ! * Toprol 4)  ! Morphine  Past History:  Past Medical History: Last updated: 11/08/2009 DIABETES MELLITUS (ICD-250.00) ATRIAL FIBRILLATION, HX OF (ICD-V12.59) cured with Maze /  recurrent  February,2011.Marland KitchenMarland KitchenTEE cardioversion 10/19/2009 Atrial tachycardia.Marland KitchenMarland KitchenFebruary,2011.Marland KitchenMarland KitchenMarland KitchenTikosyn started in hospital   Bradycardia   in hospital post cardioversion .Marland KitchenMarland Kitchen2/2011...dig/cardizem/metorolol stopped  (may need pacer) Coumadin  continued electively post Maze Atrial appendage removed (or tied off) at surgery  (proven by [facility] 10/2009 Mitral regurgitation (treated with mitral repair) Pulmonary hypertension.Marland KitchenMarland KitchenMarland Kitchen   echo.Marland Kitchen.10/2009 RV dysfunction...mild/mod...echo 10/2009 TR  moderate...echo..10/2009 Mitral Valve repair .Marland Kitchen [facility] with Maze procedure Mitral stenosis.....mild .Marland KitchenMarland Kitchen2/2011....post MV repair CAD....cath 10/2009 CABG  [place] to LAD at time of mitral valve repair / LIMA atretic .Marland Kitchen.cath...10/2009 HYPERLIPIDEMIA (ICD-272.4) HYPERTENSION (ICD-401.9) 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 EF 30%  echo and TEE.Marland Kitchen.  newly found 10/2009...etiology not clear (not CAD...see cath)...consider DM, Idiopathic, Myocarditis, (not thyroid) consider rate related with atrial tachycardia...can not use carvedilol..Use meds and follow...no constriction at cath 10/2009 ICD or PTVDP may be needed at some point after f/u after 10/2009 admission CHF   mild in hospital..Marland Kitchen2/2011 Pulmonary illness...hospital ..Marland Kitchen2/2011.. Dr. Craige Cotta...received antibiotics and meds (not steroids).. May be return of old allergies....cough and rhinitis...  Review of Systems       Patient denies fever, chills, headache, sweats, rash, change in vision, change in hearing, chest pain, cough, nausea vomiting, urinary symptoms.  All other systems are reviewed and are negative.  Vital Signs:  Patient profile:   70 year old female Height:  62 inches Weight:      171 pounds BMI:     31.39 Pulse rate:   70 / minute BP sitting:   134 / 68  (left arm) Cuff size:   regular  Vitals Entered By: Hardin Negus, RMA (December 01, 2009 11:33  AM)  Physical Exam  General:  she looks fine in general. Eyes:  no xanthelasma. Neck:  no jugulovenous distention. Lungs:  lungs are clear.  Respiratory effort is nonlabored. Heart:  cardiac exam reveals an S1-S2. Abdomen:  abdomen is soft. Extremities:  no peripheral edema. Psych:  patient is oriented to person time and place.  Affect is normal.   Impression & Recommendations:  Problem # 1:  ATRIAL TACHYCARDIA (ICD-427.89)  Her updated medication list for this problem includes:    Coumadin 5 Mg Tabs (Warfarin sodium) .Marland Kitchen... Take as directed by coumadin clinic.    Aspirin 81 Mg Tbec (Aspirin) ..... One tablet by mouth once daily    Mavik 4 Mg Tabs (Trandolapril) .Marland Kitchen... Take 2 tablets by mouth once a day    Tikosyn 500 Mcg Caps (Dofetilide) .Marland Kitchen... Take one capsule by mouth twice a day    Carvedilol 6.25 Mg Tabs (Carvedilol) .Marland Kitchen... Take one tablet by mouth twice a day Today on physical exam there is question that she may have some atrial bigeminy.  I've chosen not to do an EKG.  She will remain on tikosyn.  Problem # 2:  BRADYCARDIA (ICD-427.89)  Her updated medication list for this problem includes:    Coumadin 5 Mg Tabs (Warfarin sodium) .Marland Kitchen... Take as directed by coumadin clinic.    Aspirin 81 Mg Tbec (Aspirin) ..... One tablet by mouth once daily    Mavik 4 Mg Tabs (Trandolapril) .Marland Kitchen... Take 2 tablets by mouth once a day    Tikosyn 500 Mcg Caps (Dofetilide) .Marland Kitchen... Take one capsule by mouth twice a day    Carvedilol 6.25 Mg Tabs (Carvedilol) .Marland Kitchen... Take one tablet by mouth twice a day She is tolerating carvedilol.  We will slowly increase her to 6.25 b.i.d.  Problem # 3:  * COUGH / RHINITIS The symptoms appear to improve completely.  No further workup.  Problem # 4:  * EF 30% Carvedilol dose is to be increased.  We'll see her back in 4 weeks.  Patient Instructions: 1)  Your physician recommends that you schedule a follow-up appointment in 4 weeks. 2)  Your physician has  recommended you make the following change in your medication:  Carvedilol 3.125 mg  one (1) tablet every morning and two (2) tablets every evening for one week.  Then Carvedilol 6.25 mg one tablet two times a day  Prescriptions: CARVEDILOL 6.25 MG TABS (CARVEDILOL) Take one tablet by mouth twice a day  #60 x 6   Entered by:   Sherri Rad, RN, BSN   Authorized by:   Talitha Givens, MD, [facility]   Signed by:   Sherri Rad, RN, BSN on 12/01/2009   Method used:   Electronically to        [facility]. [phone]* (retail)       [phone]       [place], [place]  [address]       Ph: [phone]       Fax: [phone]   RxID:   [id number]

## 2010-10-06 NOTE — Medication Information (Signed)
Summary: Coumadin Clinic  Anticoagulant Therapy  Managed by: Bethena Midget, RN, BSN Referring MD: [name] MD PCP: Marisue Brooklyn, DO Supervising MD: Riley Kill MD, Maisie Fus Indication 1: Atrial Fibrillation (ICD-427.31) Lab Used: [facility] Site: [facility] PT 22.3 INR POC 1.98 INR RANGE 2 - 3  Dietary changes: yes       Details: poor appetite  Health status changes: yes       Details: Cold signs and symptoms  Bleeding/hemorrhagic complications: no    Recent/future hospitalizations: no    Any changes in medication regimen? yes       Details: zpak started yesterday  Recent/future dental: no  Any missed doses?: no       Is patient compliant with meds? yes      Comments: Labs draw at PCP office on 10/11/09  Allergies: 1)  ! * Tetanus 2)  ! Demerol (Meperidine Hcl) 3)  ! * Toprol 4)  ! Morphine  Anticoagulation Management History:      Her anticoagulation is being managed by telephone today.  Positive risk factors for bleeding include an age of 71 years or older and presence of serious comorbidities.  The bleeding index is 'intermediate risk'.  Positive CHADS2 values include History of HTN and History of Diabetes.  Negative CHADS2 values include Age > 67 years old.  The start date was 01/26/1998.  Prothrombin time is 22.3.  Anticoagulation responsible provider: Riley Kill MD, Maisie Fus.  INR POC: 1.98.    Anticoagulation Management Assessment/Plan:      The patient's current anticoagulation dose is Coumadin 5 mg tabs: Take as directed by coumadin clinic..  The target INR is 2 - 3.  The next INR is due 10/29/2009.  Anticoagulation instructions were given to patient.  Results were reviewed/authorized by Bethena Midget, RN, BSN.  She was notified by Bethena Midget, RN, BSN.         Prior Anticoagulation Instructions: The patient is to continue with the same dose of coumadin.  This dosage includes:   Current Anticoagulation Instructions: INR 1.98 LMOM Bethena Midget, RN, BSN  October 12, 2009 1:34 PM Spoke with pt she is aware to change dose to 5mg s daily except 2.5mg s on Tues and Sat. Recheck in 2 weeks.

## 2010-10-06 NOTE — Letter (Signed)
Summary: [facility]   Imported By: Debby Freiberg 03/03/2010 11:02:06  _____________________________________________________________________  External Attachment:    Type:   Image     Comment:   External Document

## 2010-10-06 NOTE — Letter (Signed)
Summary: [employer]: Medical Disability Form  [employer]: Medical Disability Form   Imported By: Earl Many 12/11/2009 11:27:53  _____________________________________________________________________  External Attachment:    Type:   Image     Comment:   External Document

## 2010-10-06 NOTE — Medication Information (Signed)
Summary: rov/tm  Anticoagulant Therapy  Managed by: Bethena Midget, RN, BSN Referring MD: [name] MD PCP: Marisue Brooklyn, DO Supervising MD: Johney Frame MD, Fayrene Fearing Indication 1: Atrial Fibrillation (ICD-427.31) Lab Used: [facility] Site: [facility] INR POC 3.6 INR RANGE 2 - 3  Dietary changes: no    Health status changes: no    Bleeding/hemorrhagic complications: no    Recent/future hospitalizations: no    Any changes in medication regimen? yes       Details: Took Prednisone for 6 days, completed regimen on Sat.   Recent/future dental: no  Any missed doses?: no       Is patient compliant with meds? yes       Allergies: 1)  ! * Tetanus 2)  ! Demerol (Meperidine Hcl) 3)  ! * Toprol 4)  ! Morphine  Anticoagulation Management History:      The patient is taking warfarin and comes in today for a routine follow up visit.  Positive risk factors for bleeding include an age of 60 years or older and presence of serious comorbidities.  The bleeding index is 'intermediate risk'.  Positive CHADS2 values include History of CHF, History of HTN, and History of Diabetes.  Negative CHADS2 values include Age > 37 years old.  The start date was 01/26/1998.  Anticoagulation responsible provider: Allred MD, Fayrene Fearing.  INR POC: 3.6.  Cuvette Lot#: [id number].  Exp: 06/2011.    Anticoagulation Management Assessment/Plan:      The patient's current anticoagulation dose is Coumadin 5 mg tabs: Take as directed by coumadin clinic..  The target INR is 2 - 3.  The next INR is due 05/18/2010.  Anticoagulation instructions were given to patient.  Results were reviewed/authorized by Bethena Midget, RN, BSN.  She was notified by Bethena Midget, RN, BSN.         Prior Anticoagulation Instructions: INR 2.7  Continue Coumadin 1 tablet daily except 1/2 tablet on Tuesdays. Return to clinic in 4 weeks.  Current Anticoagulation Instructions: INR 3.6 Skip today then resume. Recheck in 3 weeks.

## 2010-10-06 NOTE — Progress Notes (Signed)
Summary: return to work  Phone Note Call from Patient Call back at [employer] [phone]   Caller: Patient Reason for Call: Talk to Nurse Summary of Call: pt wants to know when she can go back to work Initial call taken by: Migdalia Dk,  October 29, 2009 10:50 AM  Follow-up for Phone Call        pt works in a [employer] just Mon-Wed, per Dr Myrtis Ser stay out of work until seen on 3/8, pts husband aware Meredith Staggers, RN  October 29, 2009 4:36 PM

## 2010-10-06 NOTE — Assessment & Plan Note (Signed)
Summary: 1 mo f/u   Visit Type:  Follow-up Primary Provider:  Marisue Brooklyn, DO  CC:  cardiomyopathy.  History of Present Illness: The patient is seen in followup of cardiomyopathy.  In February 2011 she had a complicated course in the hospital.  Eventually she has held sinus rhythm on Tikosyn.  Her energy level is good.  She is not having chest pain or shortness of breath.  She is going about full activities.  She had had significant bradycardia in the hospital..  Despite this his we have been able to put her on a small amount of carvedilol for her left ventricular dysfunction.  She has not had syncope or presyncope.  Current Medications (verified): 1)  Actos 45 Mg Tabs (Pioglitazone Hcl) .Marland Kitchen.. 1 Tablet By Mouth Once Daily 2)  Coumadin 5 Mg Tabs (Warfarin Sodium) .... Take As Directed By Coumadin Clinic. 3)  Furosemide 40 Mg Tabs (Furosemide) .Marland Kitchen.. 1 Tablet By Mouth Once Daily 4)  Klor-Con 8 Meq Cr-Tabs (Potassium Chloride) .... One Tablet Three Times A Day 5)  Aspirin 81 Mg Tbec (Aspirin) .... One Tablet By Mouth Once Daily 6)  Effexor 37.5 Mg Tabs (Venlafaxine Hcl) .Marland Kitchen.. 1 Tablet By Mouth Once Daily 7)  Fish Oil  Oil (Fish Oil) .Marland Kitchen.. 1000 Mg Two Times A Day 8)  Calcium 600 1500 Mg Tabs (Calcium Carbonate) .Marland Kitchen.. 1 Tablet Two Times A Day 9)  Lipitor 40 Mg Tabs (Atorvastatin Calcium) .... Take 1 Tablet By Mouth Once A Day 10)  Glimepiride 2 Mg Tabs (Glimepiride) .... 1/2 Tablet By Mouth Once Daily 11)  Mavik 4 Mg Tabs (Trandolapril) .... Take 2 Tablets By Mouth Once A Day 12)  Vitamin D3 1000 Unit Caps (Cholecalciferol) .... Take 4 Tablets By Mouth Once Daily 13)  Tikosyn 500 Mcg Caps (Dofetilide) .... Take One Capsule By Mouth Twice A Day 14)  Carvedilol 6.25 Mg Tabs (Carvedilol) .... Take One Tablet By Mouth Twice A Day  Allergies (verified): 1)  ! * Tetanus 2)  ! Demerol (Meperidine Hcl) 3)  ! * Toprol 4)  ! Morphine  Past History:  Past Medical History: Last updated:  11/08/2009 DIABETES MELLITUS (ICD-250.00) ATRIAL FIBRILLATION, HX OF (ICD-V12.59) cured with Maze /  recurrent February,2011.Marland KitchenMarland KitchenTEE cardioversion 10/19/2009 Atrial tachycardia.Marland KitchenMarland KitchenFebruary,2011.Marland KitchenMarland KitchenMarland KitchenTikosyn started in hospital   Bradycardia   in hospital post cardioversion .Marland KitchenMarland Kitchen2/2011...dig/cardizem/metorolol stopped  (may need pacer) Coumadin  continued electively post Maze Atrial appendage removed (or tied off) at surgery  (proven by [facility] 10/2009 Mitral regurgitation (treated with mitral repair) Pulmonary hypertension.Marland KitchenMarland KitchenMarland Kitchen   echo.Marland Kitchen.10/2009 RV dysfunction...mild/mod...echo 10/2009 TR  moderate...echo..10/2009 Mitral Valve repair .Marland Kitchen [facility] with Maze procedure Mitral stenosis.....mild .Marland KitchenMarland Kitchen2/2011....post MV repair CAD....cath 10/2009 CABG  [place] to LAD at time of mitral valve repair / LIMA atretic .Marland Kitchen.cath...10/2009 HYPERLIPIDEMIA (ICD-272.4) HYPERTENSION (ICD-401.9) 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 EF 30%  echo and TEE.Marland Kitchen.  newly found 10/2009...etiology not clear (not CAD...see cath)...consider DM, Idiopathic, Myocarditis, (not thyroid) consider rate related with atrial tachycardia...can not use carvedilol..Use meds and follow...no constriction at cath 10/2009 ICD or PTVDP may be needed at some point after f/u after 10/2009 admission CHF   mild in hospital..Marland Kitchen2/2011 Pulmonary illness...hospital ..Marland Kitchen2/2011.. Dr. Craige Cotta...received antibiotics and meds (not steroids).. May be return of old allergies....cough and rhinitis...  Review of Systems       Patient denies fever, chills, sweats, rash, change in vision, change in hearing, chest pain, cough, nausea vomiting. She does have a mild headache today.  All other systems are reviewed and are negative.  Vital Signs:  Patient profile:   70 year old female Height:      62 inches Weight:      176 pounds BMI:     32.31 Pulse rate:   65 / minute BP  sitting:   124 / 64  (left arm) Cuff size:   regular  Vitals Entered By: Hardin Negus, RMA (Jan 06, 2010 9:39 AM)  Physical Exam  General:  she looks quite good today. Head:  head is atraumatic. Eyes:  no xanthelasma. Neck:  no jugular venous distention. Chest Wall:  no chest wall tenderness. Lungs:  lungs are clear.  Respiratory effort is nonlabored. Heart:  cardiac exam reveals S1 and S2.  She has at times the bigeminal rhythm. Abdomen:  abdomen is soft. Msk:  no musculoskeletal deformities. Extremities:  no peripheral edema. Skin:  no skin rashes. Psych:  patient is oriented to person time and place.  Affect is normal.   Impression & Recommendations:  Problem # 1:  CARDIOMYOPATHY (ICD-425.4) Patient had LV dysfunction documented for the first time in February, 2011.  She has been on the most appropriate medications that we could use.  She feels good.  It is now time to reassess LV function.  Two-dimensional echo will be done.  We will make further decisions on her meds and other workup based on the echo.  Problem # 2:  * MITRAL VALVE REPAIR The patient's mitral valve repair is stable.  No further workup needed.  Problem # 3:  ATRIAL TACHYCARDIA (ICD-427.89)  Her updated medication list for this problem includes:    Coumadin 5 Mg Tabs (Warfarin sodium) .Marland Kitchen... Take as directed by coumadin clinic.    Aspirin 81 Mg Tbec (Aspirin) ..... One tablet by mouth once daily    Mavik 4 Mg Tabs (Trandolapril) .Marland Kitchen... Take 2 tablets by mouth once a day    Tikosyn 500 Mcg Caps (Dofetilide) .Marland Kitchen... Take one capsule by mouth twice a day    Carvedilol 6.25 Mg Tabs (Carvedilol) .Marland Kitchen... Take one tablet by mouth twice a day  Orders: EKG w/ Interpretation (93000) On physical exam the patient does have a bigeminal rhythm heart at the time.  EKG is done and carefully reviewed.  The patient has small P waves because of her history of a maze procedure.  I believe that there are P waves.  I believe that  the rhythm is sinus rhythm with some atrial bigeminy.  No change in her therapy at this time.  Problem # 4:  BRADYCARDIA (ICD-427.89) The patient's heart rate is reasonable today.  She tolerates carvedilol despite her prior history of significant bradycardia.  I've chosen not to titrate the dose any higher at this point.  Problem # 5:  * RV DYSFUNCTION Two-dimensional echo will be done.  I am hopeful that we will see improvement in both left ventricular and right ventricular function.  Problem # 6:  MITRAL STENOSIS (ICD-394.0) There is mild functional mitral stenosis after her mitral valve repair.  No change in therapy.  Problem # 7:  * COUGH / RHINITIS The patient had significant problems with this when she was hospitalized.  This has resolved completely.  Problem # 8:  CAD (ICD-414.00)  Her updated medication list for this problem includes:    Coumadin 5 Mg Tabs (Warfarin sodium) .Marland Kitchen... Take as directed by coumadin clinic.    Aspirin 81 Mg Tbec (Aspirin) ..... One tablet by mouth once daily  Mavik 4 Mg Tabs (Trandolapril) .Marland Kitchen... Take 2 tablets by mouth once a day    Carvedilol 6.25 Mg Tabs (Carvedilol) .Marland Kitchen... Take one tablet by mouth twice a day Coronary disease is mild and stable.  No change in therapy.  Other Orders: Echocardiogram (Echo)  Patient Instructions: 1)  Your physician has requested that you have an echocardiogram.  Echocardiography is a painless test that uses sound waves to create images of your heart. It provides your doctor with information about the size and shape of your heart and how well your heart's chambers and valves are working.  This procedure takes approximately one hour. There are no restrictions for this procedure. 2)  Follow up in 6 weeks

## 2010-10-06 NOTE — Progress Notes (Signed)
Summary: PT REQUEST CALL  Phone Note Call from Patient Call back at Home Phone [phone]   Caller: Patient Summary of Call: PT REQUEST CALL. Initial call taken by: Judie Grieve,  November 16, 2009 11:42 AM  Follow-up for Phone Call        disability needs last weeks ov and also a letter from Dr Myrtis Ser stating need for extended leave, stating a-fib and cardiomyopathy, will have Dr Myrtis Ser do a letter on 3/17, will call pt when ready Meredith Staggers, RN  November 16, 2009 5:52 PM   letter completed and left along w/ov note at front desk for pt to pick up, pt is aware Meredith Staggers, RN  November 18, 2009 11:42 AM

## 2010-10-06 NOTE — Medication Information (Signed)
Summary: [id number]  Anticoagulant Therapy  Managed by: Bethena Midget, RN, BSN Referring MD: [name] MD PCP: Marisue Brooklyn, DO Supervising MD: Antoine Poche MD, Fayrene Fearing Indication 1: Atrial Fibrillation (ICD-427.31) Lab Used: [facility] Site: [facility] INR POC 2.4 INR RANGE 2 - 3  Dietary changes: no    Health status changes: no    Bleeding/hemorrhagic complications: yes       Details: Bruise on Rt. arm post fall [place] a botan[place] garden. States she didn't hurt herself.   Recent/future hospitalizations: no    Any changes in medication regimen? yes       Details: Actos decreased 30mg s daily   Recent/future dental: no  Any missed doses?: no       Is patient compliant with meds? yes       Current Medications (verified): 1)  Actos 30 Mg Tabs (Pioglitazone Hcl) .... Take 1 Tab Daily 2)  Coumadin 5 Mg Tabs (Warfarin Sodium) .... Take As Directed By Coumadin Clinic. 3)  Furosemide 40 Mg Tabs (Furosemide) .Marland Kitchen.. 1 Tablet By Mouth Once Daily 4)  Klor-Con 8 Meq Cr-Tabs (Potassium Chloride) .... One Tablet Three Times A Day 5)  Aspirin 81 Mg Tbec (Aspirin) .... One Tablet By Mouth Once Daily 6)  Effexor 37.5 Mg Tabs (Venlafaxine Hcl) .Marland Kitchen.. 1 Tablet By Mouth Once Daily 7)  Fish Oil  Oil (Fish Oil) .Marland Kitchen.. 1000 Mg Two Times A Day 8)  Calcium 600 1500 Mg Tabs (Calcium Carbonate) .Marland Kitchen.. 1 Tablet Two Times A Day 9)  Lipitor 40 Mg Tabs (Atorvastatin Calcium) .... Take 1 Tablet By Mouth Once A Day 10)  Glimepiride 2 Mg Tabs (Glimepiride) .... 1/2 Tablet By Mouth Once Daily 11)  Mavik 4 Mg Tabs (Trandolapril) .... Take 2 Tablets By Mouth Once A Day 12)  Vitamin D3 1000 Unit Caps (Cholecalciferol) .... Take 4 Tablets By Mouth Once Daily 13)  Tikosyn 500 Mcg Caps (Dofetilide) .... Take One Capsule By Mouth Twice A Day 14)  Carvedilol 6.25 Mg Tabs (Carvedilol) .... Take One Tablet By Mouth Twice A Day  Allergies: 1)  ! * Tetanus 2)  ! Demerol (Meperidine Hcl) 3)  ! * Toprol 4)  !  Morphine  Anticoagulation Management History:      The patient is taking warfarin and comes in today for a routine follow up visit.  Positive risk factors for bleeding include an age of 71 years or older and presence of serious comorbidities.  The bleeding index is 'intermediate risk'.  Positive CHADS2 values include History of CHF, History of HTN, and History of Diabetes.  Negative CHADS2 values include Age > 77 years old.  The start date was 01/26/1998.  Anticoagulation responsible provider: Antoine Poche MD, Fayrene Fearing.  INR POC: 2.4.  Cuvette Lot#: [id number].  Exp: 04/2011.    Anticoagulation Management Assessment/Plan:      The patient's current anticoagulation dose is Coumadin 5 mg tabs: Take as directed by coumadin clinic..  The target INR is 2 - 3.  The next INR is due 03/10/2010.  Anticoagulation instructions were given to patient.  Results were reviewed/authorized by Bethena Midget, RN, BSN.  She was notified by Bethena Midget, RN, BSN.         Prior Anticoagulation Instructions: The patient is to continue with the same dose of coumadin.  This dosage includes: 5mg  daily except 2.5mg  on Tues.  Current Anticoagulation Instructions: INR 2.4 Continue 5mg  everyday except 2.5mg s on Tuesdays. Recheck in 4 weeks.

## 2010-10-06 NOTE — Medication Information (Signed)
Summary: Effexor XR Caps  Effexor XR Caps   Imported By: Marylou Mccoy 05/17/2010 15:01:31  _____________________________________________________________________  External Attachment:    Type:   Image     Comment:   External Document

## 2010-10-06 NOTE — Progress Notes (Signed)
Summary: FMLA and cruise forms  Phone Note Outgoing Call   Call placed by: Meredith Staggers, RN,  November 11, 2009 3:10 PM Call placed to: Patient Summary of Call: pts fmla forms for work and her form to cancel her cruise from [employer] have been completed, called and left mess on machine paperwork was ready and would leave at front desk for pt to pick, call back if questions

## 2010-10-06 NOTE — Miscellaneous (Signed)
  Clinical Lists Changes  Observations: Added new observation of CARDIO HPI: I spoke with Dr. Johney Frame. He agrees that QTC of is OK in this patient who clearly benefits from Effexor and Tikosyn. No shange for now.   (04/04/2010 9:20) Added new observation of PRIMARY MD: Marisue Brooklyn, DO (04/04/2010 9:20)      Primary Provider:  Marisue Brooklyn, DO   History of Present Illness: I spoke with Dr. Johney Frame. He agrees that QTC of is OK in this patient who clearly benefits from Effexor and Tikosyn. No shange for now.

## 2010-10-06 NOTE — Letter (Signed)
Summary: [employer] - Fax Cover  [employer]/ [employer] - Fax Cover   Imported By: Earl Many 12/11/2009 11:32:32  _____________________________________________________________________  External Attachment:    Type:   Image     Comment:   External Document

## 2010-10-06 NOTE — Letter (Signed)
Summary: [employer]/ [employer] - Physician Statement  [employer]/ [employer] - Physician Statement   Imported By: Earl Many 12/11/2009 11:29:31  _____________________________________________________________________  External Attachment:    Type:   Image     Comment:   External Document

## 2010-10-06 NOTE — Progress Notes (Signed)
Summary: questions about medication  Phone Note Call from Patient Call back at Home Phone [phone]   Caller: Patient Summary of Call: Pt calling with question about the medication Cardizem CD 120mg . Initial call taken by: Judie Grieve,  October 26, 2009 3:09 PM  Follow-up for Phone Call        spoke w/pt, she reports that she [facility] [address] for carizem 120 and they told her it was not in stock and wouldn't be in until tom. she is concerned.  Called and spoke w/pharmacist he had already spoken w/Matt Karleen Hampshire PA and straightened it out, pt aware Meredith Staggers, RN  October 26, 2009 4:01 PM

## 2010-10-06 NOTE — Letter (Signed)
Summary: Handout Printed  Printed Handout:  - Coumadin Instructions-w/out Meds 

## 2010-10-06 NOTE — Miscellaneous (Signed)
  Clinical Lists Changes  Observations: Added new observation of CARDIO HPI: Patient was prescribed digoxin and cardizem when she was discharged from hospital. I called her this AM to say that I do not want her to take these for now. Her rate was slow in sinus rhythm. (10/27/2009 8:35) Added new observation of PRIMARY MD: Marisue Brooklyn, DO (10/27/2009 8:35)      Primary Provider:  Marisue Brooklyn, DO   History of Present Illness: Patient was prescribed digoxin and cardizem when she was discharged from hospital. I called her this AM to say that I do not want her to take these for now. Her rate was slow in sinus rhythm.  Appended Document:  Herbert Seta,   Wanted to be sure you knew about this.

## 2010-10-06 NOTE — Assessment & Plan Note (Signed)
Summary: 6wk f/u sl   Visit Type:  Follow-up Primary Provider:  Marisue Brooklyn, DO  CC:  cardiomyopathy.  History of Present Illness: The patient is seen for followup mitral valve repair, rate related cardiomyopathy, supraventricular tachycardia, Tikosyn therapy.  She is doing very well.  We have received information about the fact that the QT interval is prolonged both by Tikosyn and Effexor.  I have reviewed all of her prior EKGs while she's on both of these drugs.  Her QT interval has been within normal range.  She is helped greatly by both drugs. EKG will be done today.  Current Medications (verified): 1)  Actos 30 Mg Tabs (Pioglitazone Hcl) .... Take 1 Tab Daily 2)  Coumadin 5 Mg Tabs (Warfarin Sodium) .... Take As Directed By Coumadin Clinic. 3)  Furosemide 40 Mg Tabs (Furosemide) .... 1/2 Tablet By Mouth Once Daily 4)  Klor-Con 8 Meq Cr-Tabs (Potassium Chloride) .... One Tablet Three Times A Day 5)  Aspirin 81 Mg Tbec (Aspirin) .... One Tablet By Mouth Once Daily 6)  Effexor 37.5 Mg Tabs (Venlafaxine Hcl) .Marland Kitchen.. 1 Tablet By Mouth Once Daily 7)  Fish Oil  Oil (Fish Oil) .Marland Kitchen.. 1000 Mg Two Times A Day 8)  Calcium 600 1500 Mg Tabs (Calcium Carbonate) .Marland Kitchen.. 1 Tablet Two Times A Day 9)  Lipitor 40 Mg Tabs (Atorvastatin Calcium) .... Take 1 Tablet By Mouth Once A Day 10)  Glimepiride 2 Mg Tabs (Glimepiride) .... 1/2 Tablet By Mouth Once Daily 11)  Mavik 4 Mg Tabs (Trandolapril) .... Take 2 Tablets By Mouth Once A Day 12)  Vitamin D3 1000 Unit Caps (Cholecalciferol) .... Take 4 Tablets By Mouth Once Daily 13)  Tikosyn 500 Mcg Caps (Dofetilide) .... Take One Capsule By Mouth Twice A Day 14)  Carvedilol 6.25 Mg Tabs (Carvedilol) .... Take One Tablet By Mouth Twice A Day  Allergies (verified): 1)  ! * Tetanus 2)  ! Demerol (Meperidine Hcl) 3)  ! * Toprol 4)  ! Morphine  Past History:  Past Medical History: DIABETES MELLITUS (ICD-250.00) ATRIAL FIBRILLATION, HX OF (ICD-V12.59) cured with  Maze /  recurrent February,2011.Marland KitchenMarland KitchenTEE cardioversion 10/19/2009 Atrial tachycardia.Marland KitchenMarland KitchenFebruary,2011.Marland KitchenMarland KitchenMarland KitchenTikosyn started in hospital   Bradycardia   in hospital post cardioversion .Marland KitchenMarland Kitchen2/2011...dig/cardizem/metorolol stopped  (may need pacer) Coumadin  continued electively post Maze Atrial appendage removed (or tied off) at surgery  (proven by [facility] 10/2009 Mitral regurgitation (treated with mitral repair) Pulmonary hypertension.Marland KitchenMarland KitchenMarland Kitchen   echo.Marland Kitchen.10/2009 /  no mention of pulmonary hypertension... echo... June, 2011I will RV dysfunction...mild/mod...echo 10/2009 /  normalized... echo.. February 07, 2010 TR  moderate...echo..10/2009 /  trivial... echo.. 6, 2011 Mitral Valve repair .Marland Kitchen [facility] with Maze procedure Mitral stenosis.....mild .Marland KitchenMarland Kitchen2/2011....post MV repair /  mild... echo... 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 EF 30%  echo and TEE.Marland Kitchen.  newly found 10/2009...etiology not clear (not CAD...see cath)...consider DM, Idiopathic, Myocarditis, (not thyroid) consider rate related with atrial tachycardia...can not use carvedilol..Use meds and follow...no constriction at cath 10/2009 /  EF 55-60%... echo... February 07, 2010 ICD or PTVDP may be needed at some point after f/u after 10/2009 admission CHF   mild in hospital..Marland Kitchen2/2011 Pulmonary illness...hospital ..Marland Kitchen2/2011.. Dr. Craige Cotta...received antibiotics and meds (not steroids).. May be return of old allergies....cough and rhinitis... Dizziness... very brief 2 second episodes... June, 2011 Tikosyn Rx  Review of Systems  Patient denies fever, chills, headache, sweats, rash, change in vision, change in hearing, chest pain, cough, nausea vomiting, urinary symptoms.  All other systems reviewed are  negative  Vital Signs:  Patient profile:   70 year old female Height:      62 inches Weight:      189 pounds BMI:     34.69 Pulse rate:   72 / minute BP sitting:   98 / 50  (left arm) Cuff size:   regular  Vitals Entered By: Hardin Negus, RMA (March 24, 2010 9:27 AM)  Physical Exam  General:  patient is stable. Eyes:  no xanthelasma. Neck:  no jugulovenous distention. Lungs:  lungs are clear respiratory effort is not labored. Heart:  cardiac exam reveals S1-S2.  No clicks or significant murmurs. Abdomen:  abdomen is soft. Extremities:  no peripheral edema. Psych:  patient is oriented to person time and place.  Affect is normal.   Impression & Recommendations:  Problem # 1:  * DIZZINESS.. 2 SECOND EPISODES The patient has had no recurrent dizziness.  Problem # 2:  CARDIOMYOPATHY (ICD-425.4) Cardiomyopathy has improved.  We will continue the same medicines.  We'll see her back in 6 months.  Problem # 3:  ATRIAL TACHYCARDIA (ICD-427.89) supraventricular arrhythmias controlled with medicine.  This will be continued.  Problem # 4:  * POSSIBLE NEED ICD OR PTVDP At this point her rhythm is stable.  No need for pacemaker.  Problem # 5:  * TIKOSYN RX the patient is on Tikosyn and Effexor.  EKG is done today and reviewed by me.  I have checked her CT interval in the past.  in May and the corrected QT interval was 472 ms.  Today the corrected QT interval is slightly longer at 486 ms.  As of today I am still comfortable that it is safe for the patient to use both of these medicines.  They both help her significantly.  However I will rereview this issue again with our electrophysiology team.  Patient Instructions: 1)  Your physician wants you to follow-up in:  6 months.  You will receive a reminder letter in the mail two months in advance. If you don't receive a letter, please call our office to schedule the follow-up appointment.

## 2010-10-12 NOTE — Assessment & Plan Note (Signed)
Summary: rov. gd   Vital Signs:  Patient profile:   70 year old female Height:      62 inches Weight:      188 pounds BMI:     34.51 Pulse rate:   65 / minute BP sitting:   142 / 78  (left arm) Cuff size:   regular  Vitals Entered By: Hardin Negus, RMA (October 06, 2010 1:15 PM)  Visit Type:  Follow-up Primary Provider:  Marisue Brooklyn, DO  CC:  shortness of breath.  History of Present Illness: The patient is seen for followupmitral regurgitation, coronary artery disease, atrial tachycardia, atrial fibrillation, pulmonary hypertension.  In addition, she has had increased shortness of breath with exertion over the last 2 or 3 months.  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  Her rhythm was treated and her left ventricular function improved.  She is not complaining of any significant palpitations.  There is no chest pain.  There is no PND orthopnea.  The shortness of breath is exertional  Current Medications (verified): 1)  Actos 15 Mg Tabs (Pioglitazone Hcl) .... Take 1 Tablet By Mouth Once A Day 2)  Coumadin 5 Mg Tabs (Warfarin Sodium) .... Take As Directed By Coumadin Clinic. 3)  Furosemide 40 Mg Tabs (Furosemide) .... 1/2 Tablet By Mouth Once Daily 4)  Klor-Con 8 Meq Cr-Tabs (Potassium Chloride) .... One Tablet Three Times A Day 5)  Aspirin 81 Mg Tbec (Aspirin) .... One Tablet By Mouth Once Daily 6)  Effexor 37.5 Mg Tabs (Venlafaxine Hcl) .Marland Kitchen.. 1 Tablet By Mouth Once Daily 7)  Fish Oil  Oil (Fish Oil) .Marland Kitchen.. 1000 Mg Two Times A Day 8)  Calcium 600 1500 Mg Tabs (Calcium Carbonate) .Marland Kitchen.. 1 Tablet Two Times A Day 9)  Lipitor 40 Mg Tabs (Atorvastatin Calcium) .... Take 1 Tablet By Mouth Once A Day 10)  Glimepiride 2 Mg Tabs (Glimepiride) .... 1/2 Tablet By Mouth Once Daily 11)  Mavik 4 Mg Tabs (Trandolapril) .... Take 2 Tablets By Mouth Once A Day 12)  Vitamin D3 1000 Unit Caps (Cholecalciferol) .... Take 4 Tablets By Mouth  Once Daily 13)  Tikosyn 500 Mcg Caps (Dofetilide) .... Take One Capsule By Mouth Twice A Day 14)  Carvedilol 6.25 Mg Tabs (Carvedilol) .... Take One Tablet By Mouth Twice A Day  Allergies (verified): 1)  ! * Tetanus 2)  ! Demerol (Meperidine Hcl) 3)  ! * Toprol 4)  ! Morphine  Past History:  Past Medical History: DIABETES MELLITUS (ICD-250.00) ATRIAL FIBRILLATION, HX OF (ICD-V12.59) cured with Maze /  recurrent February,2011.Marland KitchenMarland KitchenTEE cardioversion 10/19/2009 Atrial tachycardia.Marland KitchenMarland KitchenFebruary,2011.Marland KitchenMarland KitchenMarland KitchenTikosyn started in hospital   Bradycardia   in hospital post cardioversion .Marland KitchenMarland Kitchen2/2011...dig/cardizem/metorolol stopped  (may need pacer) Coumadin  continued electively post Maze Atrial appendage removed (or tied off) at surgery  (proven by [facility] 10/2009 Mitral regurgitation (treated with mitral repair) Pulmonary hypertension.Marland KitchenMarland KitchenMarland Kitchen   echo.Marland Kitchen.10/2009 /  no mention of pulmonary hypertension... echo... June, 2011I will RV dysfunction...mild/mod...echo 10/2009 /  normalized... echo.. February 07, 2010 TR  moderate...echo..10/2009 /  trivial... echo.. 6, 2011 Mitral Valve repair .Marland Kitchen [facility] with Maze procedure Mitral stenosis.....mild .Marland KitchenMarland Kitchen2/2011....post MV repair /  mild... echo... 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 EF 30%  echo and TEE.Marland Kitchen.  newly found 10/2009...etiology not clear (not CAD...see  cath)...consider DM, Idiopathic, Myocarditis, (not thyroid) consider rate related with atrial tachycardia...can not use carvedilol..Use meds and follow...no constriction at cath 10/2009 /  EF 55-60%... echo... February 07, 2010 ICD or PTVDP may be needed at some point after f/u after 10/2009 admission CHF   mild in hospital..Marland Kitchen2/2011 Pulmonary illness...hospital  ..Marland Kitchen2/2011.. Dr. Craige Cotta...received antibiotics and meds (not steroids).. May be return of old allergies....cough and rhinitis... Dizziness... very brief 2 second episodes... June, 2011 Tikosyn Rx Shortness of breath   exertional.... February, 2012  Review of Systems       Patient denies fever, chills, headache, rash, change in vision, change in hearing, chest pain, cough, nausea vomiting, urinary symptoms.  All other systems are reviewed and are negative.   Physical Exam  General:  The patient is stable in general. Head:  head is atraumatic. Eyes:  no xanthelasma. Neck:  no jugular venous distention. Chest Wall:  no chest wall tenderness. Lungs:  lungs are clear.  Respiratory effort is nonlabored. Heart:  cardiac exam reveals S1 and S2.  There no clicks or significant murmurs. Abdomen:    Abdomen is soft. Msk:  no musculoskeletal deformities. Extremities:  no peripheral edema. Skin:  no skin rash. Psych:  patient is oriented to person time and place.  Affect is normal.   Impression & Recommendations:  Problem # 1:  * TIKOSYN RX The patient continues on this medication.  She does not appear to be having supraventricular tachycardia arrhythmias.  Problem # 2:  * DIZZINESS.. 2 SECOND EPISODES She has not been having significant dizziness.  Problem # 3:  CARDIOMYOPATHY (ICD-425.4) In February, 2011 she had decreased LV function it was felt to be related to tachycardia mediated cardiomyopathy.  LV function improved.  She now has exertional shortness of breath.  I am hopeful that she has not had decrease in LV function.  2-D echo will be done.  Problem # 4:  * MITRAL VALVE REPAIR  The patient has been stable post mitral valve repair.  I do not hear a loud MR murmur.  2-D echo will be done to reassess her mitral valve.  Problem # 5:  ATRIAL TACHYCARDIA (ICD-427.89) The patient had atrial tachycardia in February, 2011.  She has not had clinical return.  Problem # 6:  BRADYCARDIA  (ICD-427.89) Bradycardia has been a problem.  It is possible that chronotropic incompetence is playing a role here.  After I have more echo information we will consider walking on the treadmill to see her heart rate response and to check her O2 sats.  Problem # 7:  PULMONARY HYPERTENSION (ICD-416.8) The patient has had some pulmonary hypertension.  Eventually she may need Coumadin function studies and further assessment of her pulmonary status.  I will first obtain data from the echo.  Problem # 8:  MITRAL STENOSIS (ICD-394.0) There has been question of some mild mitral stenosis after mitral valve repair.  This also will be reassessed with echo.  Problem # 9:  CAD (ICD-414.00) The patient does have mild coronary disease.  This does not seem to be an ischemic issue but this will be considered  Problem # 10:  ATRIAL FIBRILLATION, HX OF (ICD-V12.59) EKG is done today and reviewed by me.  Computer analysis question of atrial fibrillation.  I think that this is sinus rhythm with very small P waves related to a Maze procedure.  There is a bigeminal type rhythm.  The patient is on Coumadin.  We do know that her left atrial appendage was  either removed or tied off at the time of surgery and this was proven by TEE February, 2011  Problem # 11:  * EXERTIONAL SHORTNESS OF BREATH Etiology is not clear at this time.  O2 saturation checked at rest today is 96%.  There are no obvious physical findings.  Possible considerations include return of cardiomyopathy, chronotropic incompetence, mitral regurgitation, mitral stenosis, ischemia, pulmonary disease with pulmonary hypertension,.. The patient will have a chest x-ray and 2-D echo.  I will then see her for followup.  Other Orders: Echocardiogram (Echo) Treadmill (Treadmill) T-2 View CXR (71020TC)  Patient Instructions: 1)  Your physician recommends that you continue on your current medications as directed. Please refer to the Current Medication list given  to you today. 2)  A chest x-ray takes a picture of the organs and structures inside the chest, including the heart, lungs, and blood vessels. This test can show several things, including, whether the heart is enlarged; whether fluid is building up in the lungs; and whether pacemaker / defibrillator leads are still in place. 3)  Your physician has requested that you have an echocardiogram.  Echocardiography is a painless test that uses sound waves to create images of your heart. It provides your doctor with information about the size and shape of your heart and how well your heart's chambers and valves are working.  This procedure takes approximately one hour. There are no restrictions for this procedure. 4)  Your physician has requested that you have an exercise tolerance test.  For further information please visit [email or web address]  Please also follow instruction sheet, as given.   IN 2-3 WEEKS (ALSO SCHEDULE APPT WITH DR Myrtis Ser) Prescriptions: TIKOSYN 500 MCG CAPS (DOFETILIDE) Take one capsule by mouth twice a day  #180 x 4   Entered by:   Hardin Negus, RMA   Authorized by:   Talitha Givens, MD, [facility]   Signed by:   Hardin Negus, RMA on 10/06/2010   Method used:   Electronically to        [facility]* (retail)             ,          Ph: [phone]       Fax: [phone]   RxID:   [id number] CARVEDILOL 6.25 MG TABS (CARVEDILOL) Take one tablet by mouth twice a day  #180 x 4   Entered by:   Hardin Negus, RMA   Authorized by:   Talitha Givens, MD, [facility]   Signed by:   Hardin Negus, RMA on 10/06/2010   Method used:   Electronically to        [facility]* (retail)             ,          Ph: [phone]       Fax: [phone]   RxID:   [id number] MAVIK 4 MG TABS (TRANDOLAPRIL) Take 2 tablets by mouth once a day  #180 x 4   Entered by:   Hardin Negus, RMA   Authorized by:   Talitha Givens, MD, [facility]   Signed by:   Hardin Negus, RMA on 10/06/2010    Method used:   Electronically to        [facility]* (retail)             ,          Ph: [phone]       Fax: [phone]   RxID:  [id number] LIPITOR 40 MG TABS (ATORVASTATIN CALCIUM) Take 1 tablet by mouth once a day  #90 x 4   Entered by:   Hardin Negus, RMA   Authorized by:   Talitha Givens, MD, [facility]   Signed by:   Hardin Negus, RMA on 10/06/2010   Method used:   Electronically to        [facility]* (retail)             ,          Ph: [phone]       Fax: [phone]   RxID:   [id number] KLOR-CON 8 MEQ CR-TABS (POTASSIUM CHLORIDE) one tablet three times a day  #270 x 4   Entered by:   Hardin Negus, RMA   Authorized by:   Talitha Givens, MD, [facility]   Signed by:   Hardin Negus, RMA on 10/06/2010   Method used:   Electronically to        [facility]* (retail)             ,          Ph: [phone]       Fax: [phone]   RxID:   [id number] FUROSEMIDE 40 MG TABS (FUROSEMIDE) 1/2 tablet by mouth once daily  #45 x 4   Entered by:   Hardin Negus, RMA   Authorized by:   Talitha Givens, MD, [facility]   Signed by:   Hardin Negus, RMA on 10/06/2010   Method used:   Electronically to        [facility]* (retail)             ,          Ph: [phone]       Fax: [phone]   RxID:   [id number]    Orders Added: 1)  Echocardiogram [Echo] 2)  Treadmill [Treadmill] 3)  T-2 View CXR [71020TC]

## 2010-10-12 NOTE — Medication Information (Signed)
Summary: rsc per pt call...ok to push out per [facility]  Anticoagulant Therapy  Managed by: Bethena Midget, RN, BSN Referring MD: [name] MD PCP: Marisue Brooklyn, DO Supervising MD: Shirlee Latch MD, Gerber Penza Indication 1: Atrial Fibrillation (ICD-427.31) Lab Used: [facility] Site: [facility] INR POC 3.8 INR RANGE 2 - 3  Dietary changes: yes       Details: Hasn't been eating green leafy veggies  Health status changes: no    Bleeding/hemorrhagic complications: no    Recent/future hospitalizations: no    Any changes in medication regimen? no    Recent/future dental: no  Any missed doses?: no       Is patient compliant with meds? yes      Comments: Seeing Dr Myrtis Ser today.   Allergies: 1)  ! * Tetanus 2)  ! Demerol (Meperidine Hcl) 3)  ! * Toprol 4)  ! Morphine  Anticoagulation Management History:      The patient is taking warfarin and comes in today for a routine follow up visit.  Positive risk factors for bleeding include an age of 70 years or older and presence of serious comorbidities.  The bleeding index is 'intermediate risk'.  Positive CHADS2 values include History of CHF, History of HTN, and History of Diabetes.  Negative CHADS2 values include Age > 54 years old.  The start date was 01/26/1998.  Anticoagulation responsible provider: Shirlee Latch MD, Rocsi Hazelbaker.  INR POC: 3.8.  Cuvette Lot#: [id number].  Exp: 09/2011.    Anticoagulation Management Assessment/Plan:      The patient's current anticoagulation dose is Coumadin 5 mg tabs: Take as directed by coumadin clinic..  The target INR is 2 - 3.  The next INR is due 10/27/2010.  Anticoagulation instructions were given to patient.  Results were reviewed/authorized by Bethena Midget, RN, BSN.  She was notified by Bethena Midget, RN, BSN.         Prior Anticoagulation Instructions: INR 2.9  Continue on same dosage 1 tablet daily except 1/2 tablet on Tuesdays and Fridays.  Recheck in 4 weeks.   Current Anticoagulation Instructions: INR  3.8 Skip todays dose then resume 1 pill everyday except 1/2 pill on Tuesdays and Fridays. Recheck in 3 weeks

## 2010-10-28 ENCOUNTER — Encounter: Payer: Self-pay | Admitting: Cardiology

## 2010-10-28 ENCOUNTER — Ambulatory Visit (HOSPITAL_COMMUNITY): Payer: BC Managed Care – PPO | Attending: Internal Medicine

## 2010-10-28 ENCOUNTER — Encounter: Payer: Self-pay | Admitting: Internal Medicine

## 2010-10-28 ENCOUNTER — Encounter (INDEPENDENT_AMBULATORY_CARE_PROVIDER_SITE_OTHER): Payer: BC Managed Care – PPO

## 2010-10-28 ENCOUNTER — Other Ambulatory Visit (HOSPITAL_COMMUNITY): Payer: BC Managed Care – PPO

## 2010-10-28 ENCOUNTER — Encounter (INDEPENDENT_AMBULATORY_CARE_PROVIDER_SITE_OTHER): Payer: BC Managed Care – PPO | Admitting: Cardiology

## 2010-10-28 DIAGNOSIS — R0609 Other forms of dyspnea: Secondary | ICD-10-CM | POA: Insufficient documentation

## 2010-10-28 DIAGNOSIS — R0989 Other specified symptoms and signs involving the circulatory and respiratory systems: Secondary | ICD-10-CM | POA: Insufficient documentation

## 2010-10-28 DIAGNOSIS — Z8679 Personal history of other diseases of the circulatory system: Secondary | ICD-10-CM

## 2010-10-28 DIAGNOSIS — E119 Type 2 diabetes mellitus without complications: Secondary | ICD-10-CM | POA: Insufficient documentation

## 2010-10-28 DIAGNOSIS — I10 Essential (primary) hypertension: Secondary | ICD-10-CM | POA: Insufficient documentation

## 2010-10-28 DIAGNOSIS — R0602 Shortness of breath: Secondary | ICD-10-CM

## 2010-10-28 DIAGNOSIS — I4891 Unspecified atrial fibrillation: Secondary | ICD-10-CM

## 2010-10-28 DIAGNOSIS — E785 Hyperlipidemia, unspecified: Secondary | ICD-10-CM | POA: Insufficient documentation

## 2010-11-01 NOTE — Assessment & Plan Note (Signed)
Summary: [facility]   Visit Type:  treadmill visit Primary Provider:  Marisue Brooklyn, DO   History of Present Illness: The patient had a treadmill and 2-D echo today.  I saw her last on October 06, 2010.  At that time exertional shortness of breath seemed to be the major problem.  Plans were made for her to have a 2-D echo and treadmill.  I wanted to be sure that she had not had return of decreased left ventricular function.  I also wanted to be sure she did not have chronotropic incompetence.  On the treadmill today she walked for 2 minutes.  Her heart rate did increase as high as 129.  She was limited by significant shortness of breath and fatigue and we have to stop.  Near peak stress she developed scattered PVCs.  There were also episodes with 2 wide-complex beats at a rate of 150 and 4 wide-complex beats with different morphologies at a varying rate.  She also had a brief period of ventricular bigeminy.  We did not check her O2 sats.  There was no diagnostic QRS change.  2-D echo shows that her ejection fraction remains in approximately the 50% range.  Some of her diastolic parameters are abnormal.  These are difficult to assess with her underlying rhythm.  She has some type of atrial bigeminal rhythm.  There is mild mitral stenosis after her mitral valve repair.  There is no significant mitral regurgitation.  There is also some right ventricular dysfunction. This study will have to be reviewed.  I'm not sure if there is a PA pressure documented.  There is no definite comment about significant pulmonary hypertension.  There is suggestion that her filling pressure may be elevated.  At this point the exact etiology of her shortness of breath remains unclear.  I am concerned that she had ventricular ectopy.  This occurred at her maximum level of stress.  This may be a secondary phenomenon.  However her overall exercise tolerance was limited.  I wonder if this could be a combination of  pulmonary disease, mild mitral stenosis, right ventricular dysfunction, diastolic dysfunction, mild volume overload.  It is possible that her rhythm is playing a role also.    Allergies: 1)  ! * Tetanus 2)  ! Demerol (Meperidine Hcl) 3)  ! * Toprol 4)  ! Morphine   Impression & Recommendations:  Problem # 1:  continued It is possible that she might develop ventricular bigeminy on a more frequent basis with exercise.  Chemistry will be checked.  She has had normal renal function in the past.  I will increase her Lasix dose and talk to her more about watching her salt and fluid intake.  I will obtain full pulmonary function studies.  Based on the pulmonary function studies I will decide if pulmonary evaluation is needed.  I will also consider a cardiopulmonary exercise test.  Other Orders: T-Basic Metabolic Panel [phone]) Full Pulmonary Function Test (PFT)  Patient Instructions: 1)  Your physician recommends that you schedule a follow-up appointment in: 4 weeks with Dr. Myrtis Ser 2)  Your physician recommends that you have  lab work today: bmet 3)  Your physician has recommended you make the following change in your medication: Increase lasix to 1 tablet daily 4)  Your physician has recommended that you have a pulmonary function test.  Pulmonary Function Tests are a group of tests that measure how well air moves in and out of your lungs.

## 2010-11-01 NOTE — Medication Information (Signed)
Summary: [facility]  Anticoagulant Therapy  Managed by: Cloyde Reams, RN, BSN Referring MD: [name] MD PCP: Marisue Brooklyn, DO Supervising MD: Tenny Craw MD, Gunnar Fusi Indication 1: Atrial Fibrillation (ICD-427.31) Lab Used: [facility] Site: [facility] INR POC 3.0 INR RANGE 2 - 3  Dietary changes: no    Health status changes: no    Bleeding/hemorrhagic complications: no    Recent/future hospitalizations: no    Any changes in medication regimen? no    Recent/future dental: no  Any missed doses?: no       Is patient compliant with meds? yes       Allergies: 1)  ! * Tetanus 2)  ! Demerol (Meperidine Hcl) 3)  ! * Toprol 4)  ! Morphine  Anticoagulation Management History:      The patient is taking warfarin and comes in today for a routine follow up visit.  Positive risk factors for bleeding include an age of 70 years or older and presence of serious comorbidities.  The bleeding index is 'intermediate risk'.  Positive CHADS2 values include History of CHF, History of HTN, and History of Diabetes.  Negative CHADS2 values include Age > 33 years old.  The start date was 01/26/1998.  Anticoagulation responsible provider: Tenny Craw MD, Gunnar Fusi.  INR POC: 3.0.  Cuvette Lot#: [id number].  Exp: 09/2011.    Anticoagulation Management Assessment/Plan:      The patient's current anticoagulation dose is Coumadin 5 mg tabs: Take as directed by coumadin clinic..  The target INR is 2 - 3.  The next INR is due 11/24/2010.  Anticoagulation instructions were given to patient.  Results were reviewed/authorized by Cloyde Reams, RN, BSN.  She was notified by Cloyde Reams RN.         Prior Anticoagulation Instructions: INR 3.8 Skip todays dose then resume 1 pill everyday except 1/2 pill on Tuesdays and Fridays. Recheck in 3 weeks   Current Anticoagulation Instructions: INR 3.0  Continue on same dosage 1 tablet daily except 1/2 tablet on Tuesdays and Fridays.  Recheck in 4 weeks.

## 2010-11-02 LAB — CONVERTED CEMR LAB
BUN: 17 mg/dL (ref 6–23)
Chloride: 101 meq/L (ref 96–112)
Creatinine, Ser: 0.63 mg/dL (ref 0.40–1.20)

## 2010-11-10 ENCOUNTER — Encounter: Payer: Self-pay | Admitting: Internal Medicine

## 2010-11-10 ENCOUNTER — Encounter (INDEPENDENT_AMBULATORY_CARE_PROVIDER_SITE_OTHER): Payer: BC Managed Care – PPO

## 2010-11-10 DIAGNOSIS — R0602 Shortness of breath: Secondary | ICD-10-CM

## 2010-11-10 LAB — PULMONARY FUNCTION TEST

## 2010-11-11 ENCOUNTER — Encounter: Payer: Self-pay | Admitting: Cardiology

## 2010-11-12 ENCOUNTER — Encounter: Payer: Self-pay | Admitting: Cardiology

## 2010-11-22 NOTE — Assessment & Plan Note (Signed)
Summary: pft charges   Allergies: 1)  ! * Tetanus 2)  ! Demerol (Meperidine Hcl) 3)  ! * Toprol 4)  ! Morphine   Other Orders: Carbon Monoxide diffusing w/capacity (16109) Lung Volumes/Gas dilution or washout (60454) Spirometry (Pre & Post) [phone])

## 2010-11-23 LAB — GLUCOSE, CAPILLARY
Glucose-Capillary: 106 mg/dL — ABNORMAL HIGH (ref 70–99)
Glucose-Capillary: 109 mg/dL — ABNORMAL HIGH (ref 70–99)
Glucose-Capillary: 113 mg/dL — ABNORMAL HIGH (ref 70–99)
Glucose-Capillary: 113 mg/dL — ABNORMAL HIGH (ref 70–99)
Glucose-Capillary: 119 mg/dL — ABNORMAL HIGH (ref 70–99)
Glucose-Capillary: 123 mg/dL — ABNORMAL HIGH (ref 70–99)
Glucose-Capillary: 124 mg/dL — ABNORMAL HIGH (ref 70–99)
Glucose-Capillary: 130 mg/dL — ABNORMAL HIGH (ref 70–99)
Glucose-Capillary: 132 mg/dL — ABNORMAL HIGH (ref 70–99)
Glucose-Capillary: 134 mg/dL — ABNORMAL HIGH (ref 70–99)
Glucose-Capillary: 137 mg/dL — ABNORMAL HIGH (ref 70–99)
Glucose-Capillary: 153 mg/dL — ABNORMAL HIGH (ref 70–99)
Glucose-Capillary: 156 mg/dL — ABNORMAL HIGH (ref 70–99)
Glucose-Capillary: 210 mg/dL — ABNORMAL HIGH (ref 70–99)
Glucose-Capillary: 218 mg/dL — ABNORMAL HIGH (ref 70–99)
Glucose-Capillary: 82 mg/dL (ref 70–99)
Glucose-Capillary: 86 mg/dL (ref 70–99)
Glucose-Capillary: 91 mg/dL (ref 70–99)
Glucose-Capillary: 92 mg/dL (ref 70–99)
Glucose-Capillary: 95 mg/dL (ref 70–99)
Glucose-Capillary: 97 mg/dL (ref 70–99)

## 2010-11-23 LAB — TSH: TSH: 1.023 u[IU]/mL (ref 0.350–4.500)

## 2010-11-23 LAB — BASIC METABOLIC PANEL
BUN: 14 mg/dL (ref 6–23)
BUN: 14 mg/dL (ref 6–23)
BUN: 15 mg/dL (ref 6–23)
BUN: 17 mg/dL (ref 6–23)
CO2: 27 mEq/L (ref 19–32)
CO2: 29 mEq/L (ref 19–32)
CO2: 29 mEq/L (ref 19–32)
CO2: 30 mEq/L (ref 19–32)
CO2: 31 mEq/L (ref 19–32)
CO2: 31 mEq/L (ref 19–32)
CO2: 35 mEq/L — ABNORMAL HIGH (ref 19–32)
Calcium: 9.4 mg/dL (ref 8.4–10.5)
Calcium: 9.4 mg/dL (ref 8.4–10.5)
Calcium: 9.5 mg/dL (ref 8.4–10.5)
Calcium: 9.7 mg/dL (ref 8.4–10.5)
Chloride: 100 mEq/L (ref 96–112)
Chloride: 102 mEq/L (ref 96–112)
Chloride: 103 mEq/L (ref 96–112)
Chloride: 103 mEq/L (ref 96–112)
Chloride: 104 mEq/L (ref 96–112)
Chloride: 105 mEq/L (ref 96–112)
Chloride: 99 mEq/L (ref 96–112)
Creatinine, Ser: 0.56 mg/dL (ref 0.4–1.2)
Creatinine, Ser: 0.58 mg/dL (ref 0.4–1.2)
Creatinine, Ser: 0.6 mg/dL (ref 0.4–1.2)
Creatinine, Ser: 0.63 mg/dL (ref 0.4–1.2)
Creatinine, Ser: 0.63 mg/dL (ref 0.4–1.2)
Creatinine, Ser: 0.77 mg/dL (ref 0.4–1.2)
GFR calc Af Amer: >60 mL/min (ref >60)
GFR calc Af Amer: >60 mL/min (ref >60)
GFR calc Af Amer: >60 mL/min (ref >60)
GFR calc Af Amer: >60 mL/min (ref >60)
GFR calc Af Amer: >60 mL/min (ref >60)
GFR calc non Af Amer: >60 mL/min (ref >60)
GFR calc non Af Amer: >60 mL/min (ref >60)
GFR calc non Af Amer: >60 mL/min (ref >60)
GFR calc non Af Amer: >60 mL/min (ref >60)
Glucose, Bld: 134 mg/dL — ABNORMAL HIGH (ref 70–99)
Glucose, Bld: 94 mg/dL (ref 70–99)
Glucose, Bld: 95 mg/dL (ref 70–99)
Potassium: 4 mEq/L (ref 3.5–5.1)
Potassium: 4 mEq/L (ref 3.5–5.1)
Potassium: 4.1 mEq/L (ref 3.5–5.1)
Potassium: 4.2 mEq/L (ref 3.5–5.1)
Sodium: 134 mEq/L — ABNORMAL LOW (ref 135–145)
Sodium: 140 mEq/L (ref 135–145)
Sodium: 142 mEq/L (ref 135–145)

## 2010-11-23 LAB — HEPARIN LEVEL (UNFRACTIONATED)
Heparin Unfractionated: 0.34 IU/mL (ref 0.30–0.70)
Heparin Unfractionated: 0.4 IU/mL (ref 0.30–0.70)
Heparin Unfractionated: 0.53 IU/mL (ref 0.30–0.70)
Heparin Unfractionated: 0.68 IU/mL (ref 0.30–0.70)
Heparin Unfractionated: <0.1 IU/mL — ABNORMAL LOW (ref 0.30–0.70)

## 2010-11-23 LAB — POCT I-STAT 3, VENOUS BLOOD GAS (G3P V)
Acid-Base Excess: 3 mmol/L — ABNORMAL HIGH (ref 0.0–2.0)
O2 Saturation: 66 %
TCO2: 29 mmol/L (ref 0–100)
pO2, Ven: 34 mmHg (ref 30.0–45.0)

## 2010-11-23 LAB — CBC
HCT: 33.1 % — ABNORMAL LOW (ref 36.0–46.0)
HCT: 34.6 % — ABNORMAL LOW (ref 36.0–46.0)
HCT: 35.7 % — ABNORMAL LOW (ref 36.0–46.0)
HCT: 36.3 % (ref 36.0–46.0)
HCT: 36.9 % (ref 36.0–46.0)
HCT: 42.7 % (ref 36.0–46.0)
Hemoglobin: 11.2 g/dL — ABNORMAL LOW (ref 12.0–15.0)
Hemoglobin: 11.7 g/dL — ABNORMAL LOW (ref 12.0–15.0)
Hemoglobin: 11.7 g/dL — ABNORMAL LOW (ref 12.0–15.0)
Hemoglobin: 11.8 g/dL — ABNORMAL LOW (ref 12.0–15.0)
Hemoglobin: 12.5 g/dL (ref 12.0–15.0)
Hemoglobin: 14.4 g/dL (ref 12.0–15.0)
MCHC: 33.6 g/dL (ref 30.0–36.0)
MCHC: 33.8 g/dL (ref 30.0–36.0)
MCHC: 33.9 g/dL (ref 30.0–36.0)
MCHC: 33.9 g/dL (ref 30.0–36.0)
MCHC: 34 g/dL (ref 30.0–36.0)
MCHC: 34 g/dL (ref 30.0–36.0)
MCHC: 34.1 g/dL (ref 30.0–36.0)
MCV: 89.3 fL (ref 78.0–100.0)
MCV: 89.6 fL (ref 78.0–100.0)
MCV: 89.8 fL (ref 78.0–100.0)
MCV: 90.1 fL (ref 78.0–100.0)
MCV: 90.1 fL (ref 78.0–100.0)
MCV: 90.2 fL (ref 78.0–100.0)
MCV: 90.4 fL (ref 78.0–100.0)
MCV: 90.7 fL (ref 78.0–100.0)
Platelets: 225 10*3/uL (ref 150–400)
Platelets: 227 10*3/uL (ref 150–400)
Platelets: 241 10*3/uL (ref 150–400)
RBC: 3.67 MIL/uL — ABNORMAL LOW (ref 3.87–5.11)
RBC: 3.82 MIL/uL — ABNORMAL LOW (ref 3.87–5.11)
RBC: 3.85 MIL/uL — ABNORMAL LOW (ref 3.87–5.11)
RBC: 4.02 MIL/uL (ref 3.87–5.11)
RBC: 4.13 MIL/uL (ref 3.87–5.11)
RBC: 4.34 MIL/uL (ref 3.87–5.11)
RDW: 15.1 % (ref 11.5–15.5)
RDW: 15.3 % (ref 11.5–15.5)
RDW: 15.3 % (ref 11.5–15.5)
RDW: 15.7 % — ABNORMAL HIGH (ref 11.5–15.5)
RDW: 16.3 % — ABNORMAL HIGH (ref 11.5–15.5)
WBC: 8.1 10*3/uL (ref 4.0–10.5)
WBC: 8.6 10*3/uL (ref 4.0–10.5)
WBC: 8.6 10*3/uL (ref 4.0–10.5)
WBC: 9.1 10*3/uL (ref 4.0–10.5)

## 2010-11-23 LAB — PROTIME-INR
INR: 1.51 — ABNORMAL HIGH (ref 0.00–1.49)
INR: 1.58 — ABNORMAL HIGH (ref 0.00–1.49)
INR: 1.62 — ABNORMAL HIGH (ref 0.00–1.49)
INR: 2 — ABNORMAL HIGH (ref 0.00–1.49)
INR: 2 — ABNORMAL HIGH (ref 0.00–1.49)
INR: 2.02 — ABNORMAL HIGH (ref 0.00–1.49)
INR: 2.49 — ABNORMAL HIGH (ref 0.00–1.49)
Prothrombin Time: 18.1 seconds — ABNORMAL HIGH (ref 11.6–15.2)
Prothrombin Time: 18.7 seconds — ABNORMAL HIGH (ref 11.6–15.2)
Prothrombin Time: 19.1 seconds — ABNORMAL HIGH (ref 11.6–15.2)
Prothrombin Time: 19.4 seconds — ABNORMAL HIGH (ref 11.6–15.2)
Prothrombin Time: 21 seconds — ABNORMAL HIGH (ref 11.6–15.2)
Prothrombin Time: 22.5 seconds — ABNORMAL HIGH (ref 11.6–15.2)
Prothrombin Time: 22.7 seconds — ABNORMAL HIGH (ref 11.6–15.2)

## 2010-11-23 LAB — CARDIAC PANEL(CRET KIN+CKTOT+MB+TROPI)
CK, MB: 1.9 ng/mL (ref 0.3–4.0)
CK, MB: 2 ng/mL (ref 0.3–4.0)
Relative Index: 1.2 (ref 0.0–2.5)
Total CK: 152 U/L (ref 7–177)
Total CK: 172 U/L (ref 7–177)
Troponin I: 0.05 ng/mL (ref 0.00–0.06)

## 2010-11-23 LAB — POCT I-STAT 3, ART BLOOD GAS (G3+)
Acid-Base Excess: 2 mmol/L (ref 0.0–2.0)
Bicarbonate: 27.1 mEq/L — ABNORMAL HIGH (ref 20.0–24.0)
O2 Saturation: 91 %
pO2, Arterial: 60 mmHg — ABNORMAL LOW (ref 80.0–100.0)

## 2010-11-23 LAB — DIGOXIN LEVEL: Digoxin Level: <0.2 ng/mL — ABNORMAL LOW (ref 0.8–2.0)

## 2010-11-23 LAB — COMPREHENSIVE METABOLIC PANEL
ALT: 43 U/L — ABNORMAL HIGH (ref 0–35)
Alkaline Phosphatase: 84 U/L (ref 39–117)
BUN: 10 mg/dL (ref 6–23)
CO2: 28 mEq/L (ref 19–32)
Chloride: 99 mEq/L (ref 96–112)
GFR calc non Af Amer: >60 mL/min (ref >60)
Glucose, Bld: 146 mg/dL — ABNORMAL HIGH (ref 70–99)
Potassium: 3.5 mEq/L (ref 3.5–5.1)
Sodium: 136 mEq/L (ref 135–145)
Total Bilirubin: 1.1 mg/dL (ref 0.3–1.2)

## 2010-11-23 LAB — SEDIMENTATION RATE: Sed Rate: 35 mm/hr — ABNORMAL HIGH (ref 0–22)

## 2010-11-23 LAB — CK TOTAL AND CKMB (NOT AT ARMC): CK, MB: 1.9 ng/mL (ref 0.3–4.0)

## 2010-11-25 ENCOUNTER — Ambulatory Visit: Payer: BC Managed Care – PPO | Admitting: Cardiology

## 2010-12-01 ENCOUNTER — Encounter: Payer: BC Managed Care – PPO | Admitting: *Deleted

## 2010-12-01 ENCOUNTER — Ambulatory Visit (INDEPENDENT_AMBULATORY_CARE_PROVIDER_SITE_OTHER): Payer: BC Managed Care – PPO | Admitting: *Deleted

## 2010-12-01 DIAGNOSIS — I4891 Unspecified atrial fibrillation: Secondary | ICD-10-CM

## 2010-12-01 DIAGNOSIS — Z8679 Personal history of other diseases of the circulatory system: Secondary | ICD-10-CM

## 2010-12-01 DIAGNOSIS — Z7901 Long term (current) use of anticoagulants: Secondary | ICD-10-CM

## 2010-12-01 LAB — POCT INR: INR: 3.9

## 2010-12-01 NOTE — Patient Instructions (Signed)
Skip today's dosage of Coumadin, then resume same dosage 1 tablet daily except 1/2 tablet on Tuesdays and Fridays.  Recheck in 3 weeks.

## 2010-12-12 ENCOUNTER — Encounter: Payer: Self-pay | Admitting: Cardiology

## 2010-12-22 ENCOUNTER — Encounter: Payer: Self-pay | Admitting: Cardiology

## 2010-12-22 DIAGNOSIS — I519 Heart disease, unspecified: Secondary | ICD-10-CM | POA: Insufficient documentation

## 2010-12-22 DIAGNOSIS — I050 Rheumatic mitral stenosis: Secondary | ICD-10-CM | POA: Insufficient documentation

## 2010-12-22 DIAGNOSIS — Z9889 Other specified postprocedural states: Secondary | ICD-10-CM | POA: Insufficient documentation

## 2010-12-22 DIAGNOSIS — I340 Nonrheumatic mitral (valve) insufficiency: Secondary | ICD-10-CM | POA: Insufficient documentation

## 2010-12-22 DIAGNOSIS — Z951 Presence of aortocoronary bypass graft: Secondary | ICD-10-CM | POA: Insufficient documentation

## 2010-12-22 DIAGNOSIS — I4891 Unspecified atrial fibrillation: Secondary | ICD-10-CM | POA: Insufficient documentation

## 2010-12-23 ENCOUNTER — Encounter: Payer: Self-pay | Admitting: Cardiology

## 2010-12-23 ENCOUNTER — Ambulatory Visit (INDEPENDENT_AMBULATORY_CARE_PROVIDER_SITE_OTHER): Payer: BC Managed Care – PPO | Admitting: *Deleted

## 2010-12-23 ENCOUNTER — Ambulatory Visit (INDEPENDENT_AMBULATORY_CARE_PROVIDER_SITE_OTHER): Payer: BC Managed Care – PPO | Admitting: Cardiology

## 2010-12-23 DIAGNOSIS — I471 Supraventricular tachycardia: Secondary | ICD-10-CM

## 2010-12-23 DIAGNOSIS — I2510 Atherosclerotic heart disease of native coronary artery without angina pectoris: Secondary | ICD-10-CM

## 2010-12-23 DIAGNOSIS — R0602 Shortness of breath: Secondary | ICD-10-CM

## 2010-12-23 DIAGNOSIS — I4891 Unspecified atrial fibrillation: Secondary | ICD-10-CM

## 2010-12-23 DIAGNOSIS — R9430 Abnormal result of cardiovascular function study, unspecified: Secondary | ICD-10-CM | POA: Insufficient documentation

## 2010-12-23 DIAGNOSIS — R0989 Other specified symptoms and signs involving the circulatory and respiratory systems: Secondary | ICD-10-CM

## 2010-12-23 DIAGNOSIS — J984 Other disorders of lung: Secondary | ICD-10-CM

## 2010-12-23 DIAGNOSIS — Z8679 Personal history of other diseases of the circulatory system: Secondary | ICD-10-CM

## 2010-12-23 DIAGNOSIS — I498 Other specified cardiac arrhythmias: Secondary | ICD-10-CM

## 2010-12-23 DIAGNOSIS — Z79899 Other long term (current) drug therapy: Secondary | ICD-10-CM

## 2010-12-23 DIAGNOSIS — Z5189 Encounter for other specified aftercare: Secondary | ICD-10-CM

## 2010-12-23 LAB — POCT INR: INR: 4.2

## 2010-12-23 NOTE — Progress Notes (Signed)
HPI Patient returns today for followup of shortness of breath and I saw her last on the day of her treadmill and 2-D echo on October 28, 2010.  I had seen her in the office for a full visit on October 06, 2010.  I have been evaluating her exertional shortness of breath.  When she walked on the treadmill or exercise tolerance was limited.  Her 2-D echo showed that her ejection fraction was in the 50% range.  There is some diastolic dysfunction.  There is also some mild functional mitral stenosis after mitral valve repair.  However I was not convinced that this was the only problem.  Pulmonary function studies were done in March, 2012.  They are abnormal.  There is mild obstructive disease in the small airways with response to bronchodilator.  They're normal lung volumes.  Diffusion capacity was moderately reduced.  Patient returns today and she is relatively stable.  She is limited by her shortness of breath. Allergies  Allergen Reactions  . Meperidine Hcl   . Morphine   . Tetanus Toxoid     Current Outpatient Prescriptions  Medication Sig Dispense Refill  . aspirin 81 MG tablet Take 81 mg by mouth daily.        Marland Kitchen atorvastatin (LIPITOR) 40 MG tablet Take 40 mg by mouth daily.        . calcium carbonate (OS-CAL) 600 MG TABS Take 600 mg by mouth daily.       . carvedilol (COREG) 6.25 MG tablet Take 6.25 mg by mouth 2 (two) times daily with a meal.        . Cholecalciferol (VITAMIN D3) 1000 UNITS CAPS Take 4 capsules by mouth daily.        Marland Kitchen dofetilide (TIKOSYN) 500 MCG capsule Take 500 mcg by mouth 2 (two) times daily.        . fish oil-omega-3 fatty acids 1000 MG capsule Take 2 g by mouth 2 (two) times daily.        . furosemide (LASIX) 40 MG tablet Take 40 mg by mouth daily.        Marland Kitchen glimepiride (AMARYL) 2 MG tablet Take 1 mg by mouth daily before breakfast.        . pioglitazone (ACTOS) 15 MG tablet Take 15 mg by mouth daily.        . trandolapril (MAVIK) 4 MG tablet Take 8 mg by mouth daily.         Marland Kitchen venlafaxine (EFFEXOR) 37.5 MG tablet Take 37.5 mg by mouth daily.        Marland Kitchen warfarin (COUMADIN) 5 MG tablet Take by mouth as directed.        Marland Kitchen DISCONTD: potassium chloride (KLOR-CON) 8 MEQ CR tablet Take 8 mEq by mouth 3 (three) times daily.          History   Social History  . Marital Status: Married    Spouse Name: N/A    Number of Children: N/A  . Years of Education: N/A   Occupational History  . [employer]    Social History Main Topics  . Smoking status: Never Smoker   . Smokeless tobacco: Never Used  . Alcohol Use: No  . Drug Use: No  . Sexually Active: Not on file   Other Topics Concern  . Not on file   Social History Narrative  . No narrative on file    Family History  Problem Relation Age of Onset  . Cancer Maternal Grandfather   .  Heart disease Father   . Heart disease Sister   . Diabetes Mother   . Diabetes Father   . Diabetes Sister   . Diabetes Paternal Grandfather   . Diabetes Paternal Grandmother   . Heart disease Paternal Uncle     Past Medical History  Diagnosis Date  . DM (diabetes mellitus)   . Atrial fibrillation     Treated with maze procedure / recurrent atrial fibrillation February, 2011... TEE cardioversion October 19, 2009  . Atrial tachycardia     February, 2011... Tikosyn started in hospital  . Bradycardia     Post cardioversion February, 2011,/digoxin/Cardizem metoprolol stopped, may need pacemaker  . Mitral regurgitation     Treated with repair, atrial appendage was removed or tied off at surgery.. this was proven by TEE February, 2011  . Pulmonary HTN     55 mmHg, echo, February, 2011 / no mention of pulmonary hypertension echo, June, 2011  . Right ventricular dysfunction     Mild to moderate, echo, February, 2011 / normalized echo, June, 2011  . TR (tricuspid regurgitation)     Moderate, echo, February, 2011 / trivial, echo, June, 2011  . Mitral stenosis     Mild, February, 2011, post mitral valve repair / mild,  echo, June, 2011  . CAD (coronary artery disease)     LIMA to the LAD at time of mitral valve repair / LIMA atretic,, February, 2011  . Ejection fraction < 50%     EF 30% echo and TEE diagnosis February, 2011, possibly rate related / no contraction by catheter, bradycardia so carvedilol cannot be used / EF 55-60% echo, June, 2011 / EF 50%, echo, February, 2012  . HTN (hypertension)   . Hemorrhoids, internal   . Diverticulosis of colon   . Colon polyp, hyperplastic   . Environmental allergies   . Dizziness   . Shortness of breath     February, 2012  . Warfarin anticoagulation   . atrial appendage     Removed were tied off at surgery, proven by TEE February, 2011  . S/P mitral valve repair     [facility] / Maze procedure/ atrial appendage removed were tied off  . Hx of CABG     At time of mitral valve repair  . Rash     ?Vicodin, Lotensin, Toprol  . CHF (congestive heart failure)     Mild in hospital February, 2011  . Lung abnormality      Abnormal pulmonary function studies, March, 2012 [facility] February, 2011 Dr. Craige Cotta, received antibiotics, no steroids, may be return of old allergies  . Drug therapy     Tikosyn    Past Surgical History  Procedure Date  . Atrial appendage removed   . Mitral valve repair   . Coronary artery bypass graft   . Cardiac defibrillator placement   . Maze   . Vaginal hysterectomy   . Cholecystectomy   . Breast lumpectomy     ROS  Patient denies fever, chills, headache, sweats, rash, change in vision, change in hearing, chest pain, cough, nausea vomiting, urinary symptoms.  All other systems are reviewed and are negative.  PHYSICAL EXAM Patient is oriented to person time and place.  Affect is normal.  She is here with her husband today.  Head is atraumatic.  There is no xanthelasma.  Lungs reveal no rales.  There are few scattered rhonchi.  There are few end expiratory wheezes.  Cardiac exam reveals S1-S2.  No significant murmurs.  Abdomen is  soft.  There is no peripheral edema.  There is no musculoskeletal deformities.  There no skin rashes. Filed Vitals:   12/23/10 0901  BP: 144/78  Pulse: 74  Height: 5\' 2"  (1.575 m)  Weight: 188 lb (85.276 kg)    EKG  EKG is done today and reviewed by me.  She has an atrial bigeminal rhythm.  QT Interval is difficult to assess with this QRS.  All of her QT intervals will be reviewed as she is on she Tikosyn.  ASSESSMENT & PLAN

## 2010-12-23 NOTE — Progress Notes (Signed)
Addended by: Cloyde Reams on: 12/23/2010 11:09 AM   Modules accepted: Level of Service

## 2010-12-23 NOTE — Patient Instructions (Signed)
Your physician recommends that you schedule a follow-up appointment in: 8 weeks with Dr. Myrtis Ser. You have been referred to Dr. Craige Cotta  For full Pulmonary evaluation.Pt. Has an appointment on May 9 th at 3:00 PM. Your physician recommends that you continue on your current medications as directed. Please refer to the Current Medication list given to you today.

## 2010-12-23 NOTE — Assessment & Plan Note (Signed)
The patient continues on Tikosyn.  She has been very stable with this.  I will review again all of her QT interval.  No change in therapy.

## 2010-12-23 NOTE — Assessment & Plan Note (Signed)
With her pulmonary function abnormalities I will arrange for her to be seen by Dr. Craige Cotta.  He saw her in the past when she was hospitalized in February, 2011.  She has some bronchodilator response.  We will need a full evaluation of her lung status and I'm hoping that she may be able to receive some medication that will help her breathing.

## 2010-12-23 NOTE — Assessment & Plan Note (Signed)
Fortunately her LV function has not deteriorated back to where it was in February, 2011.  No change in therapy

## 2010-12-23 NOTE — Assessment & Plan Note (Signed)
Coronary disease is stable. No change in therapy. 

## 2010-12-23 NOTE — Assessment & Plan Note (Signed)
Ongoing evaluation of shortness of breath continues.  I suspect is multifactorial.  Her LV function is adequate.  Her volume status is stable.  She does have some diastolic dysfunction and she does have some mild functional mitral stenosis.  However she has pulmonary function abnormalities.

## 2010-12-23 NOTE — Assessment & Plan Note (Signed)
Her rhythm is holding stable on Tikosyn.  No change in therapy.

## 2010-12-29 ENCOUNTER — Telehealth: Payer: Self-pay | Admitting: Cardiology

## 2010-12-29 NOTE — Telephone Encounter (Signed)
Pt wants to know if she can take glucosamine for her joint pain.

## 2010-12-30 NOTE — Telephone Encounter (Signed)
Left mess on ID VM ok cardiac wise but could be interaction since she is diabetic and on diabetics meds, advised to f/u w/pcp

## 2011-01-03 ENCOUNTER — Encounter: Payer: BC Managed Care – PPO | Admitting: *Deleted

## 2011-01-11 ENCOUNTER — Institutional Professional Consult (permissible substitution): Payer: BC Managed Care – PPO | Admitting: Pulmonary Disease

## 2011-01-12 ENCOUNTER — Ambulatory Visit (INDEPENDENT_AMBULATORY_CARE_PROVIDER_SITE_OTHER): Payer: BC Managed Care – PPO | Admitting: *Deleted

## 2011-01-12 ENCOUNTER — Encounter: Payer: BC Managed Care – PPO | Admitting: *Deleted

## 2011-01-12 DIAGNOSIS — I4891 Unspecified atrial fibrillation: Secondary | ICD-10-CM

## 2011-01-12 DIAGNOSIS — Z8679 Personal history of other diseases of the circulatory system: Secondary | ICD-10-CM

## 2011-01-13 ENCOUNTER — Encounter: Payer: Self-pay | Admitting: *Deleted

## 2011-01-13 ENCOUNTER — Encounter: Payer: Self-pay | Admitting: Pulmonary Disease

## 2011-01-16 ENCOUNTER — Encounter: Payer: Self-pay | Admitting: Pulmonary Disease

## 2011-01-16 ENCOUNTER — Ambulatory Visit (INDEPENDENT_AMBULATORY_CARE_PROVIDER_SITE_OTHER): Payer: BC Managed Care – PPO | Admitting: Pulmonary Disease

## 2011-01-16 VITALS — BP 138/82 | HR 72 | Temp 98.2°F | Ht 62.0 in | Wt 189.0 lb

## 2011-01-16 DIAGNOSIS — R0602 Shortness of breath: Secondary | ICD-10-CM

## 2011-01-16 MED ORDER — BECLOMETHASONE DIPROPIONATE 80 MCG/ACT IN AERS: INHALATION_SPRAY | RESPIRATORY_TRACT | Status: DC

## 2011-01-16 MED ORDER — ALBUTEROL SULFATE HFA 108 (90 BASE) MCG/ACT IN AERS: INHALATION_SPRAY | RESPIRATORY_TRACT | Status: DC

## 2011-01-16 NOTE — Assessment & Plan Note (Addendum)
This is likely multifactorial.  She has history of hypertension with diastolic dysfunction, as well as valvular heart disease.  These are being managed by cardiology.  She is obese, and likely has a component of deconditioning  She has a history of allergies, and recent pulmonary function test showed bronchodilator responsiveness.  She also had bronchitic changes on recent chest xray.  I will give her a trial of Qvar and as needed proair.  She is to continue her sinus regimen.  Of note is that she is on coreg.  If she does indeed have asthma, then she may need to be changed to a more cardioselective beta blocker.

## 2011-01-16 NOTE — Patient Instructions (Signed)
Qvar two puffs twice per day, and rinse mouth after using Proair two puffs up to four times per day as needed for cough, wheeze, chest congestion, or shortness of breath Follow up in 4 to 6 weeks

## 2011-01-16 NOTE — Progress Notes (Deleted)
Subjective:     Patient ID: Anita Duke, female   DOB: 1941/01/25, 70 y.o.   MRN: [id number]  HPI   Review of Systems  Respiratory: Positive for shortness of breath.   Cardiovascular: Positive for palpitations.       Objective:   Physical Exam     Assessment:     ***    Plan:     ***

## 2011-01-16 NOTE — Progress Notes (Signed)
Subjective:    Patient ID: Anita Duke, female    DOB: December 27, 1940, 70 y.o.   MRN: [id number]  HPI 70 yo female with dyspnea.  I saw Anita Duke in the hospital in Feb. 2011.  She was dyspnea in the setting of an acute upper respiratory infection.  She has a history of allergies, and has been seen by Dr. Sidney Ace with [facility].  She was previously on allergy shots.  She thinks she was told by Dr. Glenmont Callas that she may also have asthma.  Her breathing has been getting progressively worse.  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  She gets winded just walking the one block from her parking spot to the office building.  She feels like she is gasping for air, but then quickly recovers.  She has not noticed cough or chest congestion.  She does get occasional wheezing.  She gets winded if she has to carry anything like a bag of groceries.  She also notices that she will lose her breath if she is talking on the phone for too long.  She used to have trouble with her sinuses and allergies.  She was on claritin and flonase on a regular basis until she had heart surgery in 2001.  After that her allergies seemed to improve.  She is not having much trouble from her sinuses at present.  She does get post-nasal drip, and will get a tickle in her throat.  The patient denies any history of pneumonia or tuberculosis.  There is no recent travel history.  The patient denies any significant occupational exposures.  The patient denies any animal exposures.  There is no history of smoking  She had recent cardiac evaluation.  She had EF of 50% with diastolic dysfunction and valvular insufficiency.  Past Medical History  Diagnosis Date  . DM (diabetes mellitus)   . Atrial fibrillation     Treated with maze procedure / recurrent atrial fibrillation February, 2011... TEE cardioversion October 19, 2009  . Atrial tachycardia     February, 2011... Tikosyn started in hospital  . Bradycardia     Post  cardioversion February, 2011,/digoxin/Cardizem metoprolol stopped, may need pacemaker  . Mitral regurgitation     Treated with repair, atrial appendage was removed or tied off at surgery.. this was proven by TEE February, 2011  . Pulmonary HTN     55 mmHg, echo, February, 2011 / no mention of pulmonary hypertension echo, June, 2011  . Right ventricular dysfunction     Mild to moderate, echo, February, 2011 / normalized echo, June, 2011  . TR (tricuspid regurgitation)     Moderate, echo, February, 2011 / trivial, echo, June, 2011  . Mitral stenosis     Mild, February, 2011, post mitral valve repair / mild, echo, June, 2011  . CAD (coronary artery disease)     LIMA to the LAD at time of mitral valve repair / LIMA atretic,, February, 2011  . Ejection fraction < 50%     EF 30% echo and TEE diagnosis February, 2011, possibly rate related / no contraction by catheter, bradycardia so carvedilol cannot be used / EF 55-60% echo, June, 2011 / EF 50%, echo, February, 2012  . HTN (hypertension)   . Hemorrhoids, internal   . Diverticulosis of colon   . Colon polyp, hyperplastic   . Environmental allergies   . Dizziness   . Shortness of breath     February, 2012  . Warfarin anticoagulation   .  atrial appendage     Removed were tied off at surgery, proven by TEE February, 2011  . S/P mitral valve repair     [facility] / Maze procedure/ atrial appendage removed were tied off  . Hx of CABG     At time of mitral valve repair  . Rash     ?Vicodin, Lotensin, Toprol  . CHF (congestive heart failure)     Mild in hospital February, 2011  . Lung abnormality      Abnormal pulmonary function studies, March, 2012 [facility] February, 2011 Dr. Craige Cotta, received antibiotics, no steroids, may be return of old allergies  . Drug therapy     Tikosyn     Family History  Problem Relation Age of Onset  . Cancer Maternal Grandfather   . Heart disease Father   . Heart disease Sister   . Diabetes Mother   .  Diabetes Father   . Diabetes Sister   . Diabetes Paternal Grandfather   . Diabetes Paternal Grandmother   . Heart disease Paternal Uncle      History   Social History  . Marital Status: Married    Spouse Name: N/A    Number of Children: N/A  . 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     Social History Main Topics  . Smoking status: Never Smoker   . Smokeless tobacco: Never Used  . Alcohol Use: No  . Drug Use: No  . Sexually Active: Not on file   Other Topics Concern  . Not on file   Social History Narrative   Does not et regular exerciseDaily caffeine use     Allergies  Allergen Reactions  . Meperidine Hcl   . Morphine   . Tetanus Toxoid      Outpatient Prescriptions Prior to Visit  Medication Sig Dispense Refill  . aspirin 81 MG tablet Take 81 mg by mouth daily.        Marland Kitchen atorvastatin (LIPITOR) 40 MG tablet Take 40 mg by mouth daily.        . calcium carbonate (OS-CAL) 600 MG TABS Take 600 mg by mouth daily.       . carvedilol (COREG) 6.25 MG tablet Take 6.25 mg by mouth 2 (two) times daily with a meal.        . Cholecalciferol (VITAMIN D3) 1000 UNITS CAPS Take 4 capsules by mouth daily.        Marland Kitchen dofetilide (TIKOSYN) 500 MCG capsule Take 500 mcg by mouth 2 (two) times daily.        . fish oil-omega-3 fatty acids 1000 MG capsule Take 2 g by mouth 2 (two) times daily.        . furosemide (LASIX) 40 MG tablet Take 40 mg by mouth daily.        . pioglitazone (ACTOS) 15 MG tablet Take 15 mg by mouth daily.        . trandolapril (MAVIK) 4 MG tablet Take 8 mg by mouth daily.        Marland Kitchen venlafaxine (EFFEXOR) 37.5 MG tablet Take 37.5 mg by mouth daily.        Marland Kitchen warfarin (COUMADIN) 5 MG tablet Take by mouth as directed.        Marland Kitchen glimepiride (AMARYL) 2 MG tablet Take 1 mg by mouth daily before breakfast.         Review of Systems  Objective:   Physical Exam Filed Vitals:   01/16/11 1636 01/16/11 1637  BP:  138/82    Pulse:  72  Temp: 98.2 F (36.8 C)   TempSrc: Oral   Height: 5\' 2"  (1.575 m)   Weight: 189 lb (85.73 kg)   SpO2:  96%   General - no distress HEENT - PERRLA, EOMI, no sinus tenderness, clear sinus drainage, no oral exudate, no LAN, no thyromegaly Cardiac - s1s2 with 2/6 systolic murmur, pulses symmetric Chest - no wheeze/rales/dullness Abd - soft, nontender, normal bowel sounds Ext - no e/c/c Neuro - normal strength, CN intact, A&O x 3 Psych - normal mood/behavior Skin - no rashes    PFT from 11/10/10>>FEV1 1.69(92%), FEV1% 79, TLC 4.46(101%), DLCO 63%, +bronchodilator response.  CHEST - 2 VIEW 10/06/10  Comparison: 10/20/2009  Findings: The patient is status post median sternotomy and CABG. There is stable cardiomegaly identified. Crowding of the bronchovascular markings at both lung bases are noted. The lung fields are otherwise clear with no signs of focal infiltrate or congestive failure. No pleural fluid or peribronchial cuffing is seen. No interstitial septal lines are identified to suggest early interstitial edema.  Bony structures are intact.  IMPRESSION: Stable cardiomegaly with no evidence for congestive failure. Crowding of basilar bronchovascular markings with no worrisome focal infiltrates identified.  Assessment & Plan:   DYSPNEA This is likely multifactorial.  She has history of hypertension with diastolic dysfunction, as well as valvular heart disease.  These are being managed by cardiology.  She is obese, and likely has a component of deconditioning  She has a history of allergies, and recent pulmonary function test showed bronchodilator responsiveness.  She also had bronchitic changes on recent chest xray.  I will give her a trial of Qvar and as needed proair.  She is to continue her sinus regimen.   Of note is that she is on coreg.  If she does indeed have asthma, then she may need to be changed to a more cardioselective beta blocker.  Updated  Medication List Outpatient Encounter Prescriptions as of 01/16/2011  Medication Sig Dispense Refill  . aspirin 81 MG tablet Take 81 mg by mouth daily.        Marland Kitchen atorvastatin (LIPITOR) 40 MG tablet Take 40 mg by mouth daily.        . calcium carbonate (OS-CAL) 600 MG TABS Take 600 mg by mouth daily.       . carvedilol (COREG) 6.25 MG tablet Take 6.25 mg by mouth 2 (two) times daily with a meal.        . Cholecalciferol (VITAMIN D3) 1000 UNITS CAPS Take 4 capsules by mouth daily.        Marland Kitchen dofetilide (TIKOSYN) 500 MCG capsule Take 500 mcg by mouth 2 (two) times daily.        . fish oil-omega-3 fatty acids 1000 MG capsule Take 2 g by mouth 2 (two) times daily.        . furosemide (LASIX) 40 MG tablet Take 40 mg by mouth daily.        . pioglitazone (ACTOS) 15 MG tablet Take 15 mg by mouth daily.        . trandolapril (MAVIK) 4 MG tablet Take 8 mg by mouth daily.        Marland Kitchen venlafaxine (EFFEXOR) 37.5 MG tablet Take 37.5 mg by mouth daily.        Marland Kitchen warfarin (COUMADIN) 5 MG tablet Take by mouth as directed.        Marland Kitchen  albuterol (PROAIR HFA) 108 (90 BASE) MCG/ACT inhaler Two puffs up to four times per day as needed  1 Inhaler  6  . beclomethasone (QVAR) 80 MCG/ACT inhaler Two puffs twice per day  1 Inhaler  6  . DISCONTD: glimepiride (AMARYL) 2 MG tablet Take 1 mg by mouth daily before breakfast.

## 2011-01-17 NOTE — Assessment & Plan Note (Signed)
[facility]                            CARDIOLOGY OFFICE NOTE   JADIN, KAGEL                      MRN:          [id number]  DATE:02/20/2008                            DOB:          07-04-1941    Anita Duke is here for cardiology followup.  She is doing very well.  She has been watching her blood pressure at home and it has been well  controlled.  She is now on Caduet, which includes Norvasc.  She is not  having any chest pain or shortness of breath.  She is going about full  activities.   She has a history of mitral valve repair and a maze procedure at the  [facility].  She has mild coronary disease and had a LIMA to her LAD at  the time of her mitral surgery.  Her echo done in April 2009 showed  excellent LV function and excellent function of her mitral repair.   PAST MEDICAL HISTORY:   ALLERGIES:  QUESTION METOPROLOL.   MEDICATIONS:  Actos, Coumadin, diltiazem, furosemide, potassium,  Lanoxin, aspirin, Effexor, fish oil, calcium, Mavik, Caduet, and  glimepiride.   OTHER MEDICAL PROBLEMS:  See the list on my note of December 19, 2007.   REVIEW OF SYSTEMS:  She feels great and has no complaints.   PHYSICAL EXAM:  VITAL SIGNS:  Weight is 180 pounds, which is decreased  by 7 pounds since her last visit.  Blood pressure is 135/60 with a pulse  of 76.  GENERAL:  The patient is oriented to person, time, and place.  Affect is  normal.  HEENT:  Reveals no xanthelasma.  She has normal extraocular motion.  NECK:  There are no carotid bruits.  There is no jugular venous  distention.  LUNGS:  Clear.  Respiratory effort is not labored.  CARDIAC:  S1 and S2.  There are no clicks or significant murmurs.  ABDOMEN:  Soft.  EXTREMITIES:  She has no peripheral edema.   No labs were done today.   Problems listed on my note of December 19, 2007.  #1.  Hypertension.  This is now under good control.  #5.  Status post severe mitral regurgitation with  mitral valve repair  and this is working quite well.   She is stable.  No change in her medications and no testing is needed.  I will see her back in 1 year.     [name], MD, [facility]  Electronically Signed    JDK/MedQ  DD: 02/20/2008  DT: 02/20/2008  Job #: [id number]   cc:   Lovenia Kim, D.O.

## 2011-01-17 NOTE — Assessment & Plan Note (Signed)
Anita Duke                            CARDIOLOGY OFFICE NOTE   Anita Duke, Anita Duke                      MRN:          [id number]  DATE:12/19/2007                            DOB:          April 13, 1941    Anita Duke continues to do very well.  She is post mitral valve repair  with a maze procedure done at the [facility].  She has no shortness of  breath.  There is no chest pain.  She works full-time.  She is deciding  to cut back to half-time work just to enjoy life more.  She has not had  any syncope or presyncope and she has no significant palpitations.  I  had seen her last in April 2008.  I had very carefully reviewed her  chart once again and dictated a note on August 13, 2007.  I cannot  absolutely prove that her atrial appendage was removed or oversewn.  I  suspect that it was.  She is holding sinus rhythm.  She and I have  decided, however, that it is still appropriate to stay on Coumadin and I  still feel this is appropriate for her.  If she has any difficulties or  contraindications, then her Coumadin should be stopped.  Also, her  Coumadin can be very safely stopped for any procedure that she might  need.   ALLERGIES:  She does not feel well with METOPROLOL.   MEDICATIONS:  1. Actos 45 mg daily.  2. Mavik 8 mg daily.  3. Coumadin as directed.  4. Diltiazem extended release 240 b.i.d.  5. Furosemide 40.  6. Potassium.  7. Lanoxin 0.025.  8. Lipitor 40.  9. Aspirin 81.  10.Effexor.  11.Fish oil.  12.Calcium.   PAST MEDICAL HISTORY:  For other medical problems, see the complete list  below.   REVIEW OF SYSTEMS:  She is really doing well.  She has no significant  complaints and her review of systems is negative.   PHYSICAL EXAMINATION:  VITAL SIGNS:  Weight is 187 pounds.  This is  increased and we need to encourage her to begin to lose weight.  Blood  pressure is 156/60 with a pulse of 70.  GENERAL:  The patient is oriented to  person, time and place.  Affect is  normal.  HEENT:  No xanthelasma.  She has normal extraocular motion.  NECK:  There are no carotid bruits.  There is no jugular venous  distention.  LUNGS:  Lungs are clear.  Respiratory effort is not labored.  CARDIAC:  S1 and S2.  There are no clicks or significant murmurs.  ABDOMEN:  Soft.  She has no masses or bruits.  There is no peripheral  edema.   EKG reveals that her rhythm is regular.  There is slight noise in the  baseline and her P waves always are small because of her maze procedure.  Therefore, I cannot see her P waves, but I believe that this is sinus  rhythm.  No other labs are obtained.   PROBLEM LIST:  1. Hypertension.  Especially with diabetes, we  need tighter blood      pressure control.  I have encouraged her to obtain a home blood      pressure cuff.  She says that Dr. Elisabeth Most has mentioned the      possibility of using a combination medicine with Lipitor and      amlodipine.  We need some blood pressure checks and she will be      following with Dr. Elisabeth Most.  Certainly, amlodipine may be more      effective with her systolic pressure.  Hopefully, she will not have      any edema especially since she is on Actos.  We will not change her      medicine as of today, but she will obtain more blood pressure      checks and see Dr. Elisabeth Most.  2. History of cholecystectomy.  3. Elevated cholesterol.  This has been treated very well.  She will      have her followup yearly labs with Dr. Elisabeth Most.  Lipitor has done      well for her.  4. Mild coronary disease with LIMA to the LAD at the time of her      mitral surgery.  5. Status post severe mitral regurgitation with mitral valve repair      and a ring in 2001, with a maze procedure at the time of surgery.      It has now been 3 years since her last echocardiogram.  We will      obtain a 2-D echocardiogram to assess mitral valve function and LV      function.  6. History of  atrial fibrillation.  This was treated with her maze      procedure.  She has done very well.  We have made the decision to      keep her on Coumadin.  7. Coumadin therapy.  See the discussion above.  8. Question of a rash from Vicodin, Lotensin or Toprol.  9. Question of some rectal bleeding that was evaluated in the past.  10.Elevated glucose being treated.   PLAN:  She is doing extremely well.  See the full discussion above.  She  will have a 2-D echocardiogram and she will obtain a home blood pressure  cuff.     [name], MD, [facility]  Electronically Signed    JDK/MedQ  DD: 12/19/2007  DT: 12/19/2007  Job #: [id number]   cc:   Lovenia Kim, D.O.

## 2011-01-17 NOTE — Assessment & Plan Note (Signed)
[facility]                            CARDIOLOGY OFFICE NOTE   Anita Duke, Anita Duke                      MRN:          [id number]  DATE:08/13/2007                            DOB:          07/18/1941    When I saw the patient last in April of 2008, I raised the question as  to whether or not she still had her atrial appendage.  I have reviewed  the operative note carefully in the chart.  There is a mention that  there is no clot in the left atrium.  There is no mention of the left  atrial appendage.  The patient did have a surgical Maze procedure for  interruption of re-enteral atrial fibrillation.  There is no mention in  the surgical approach that her left atrial appendage was tied off.  It  is my understanding that the atrial appendage is frequently removed and  oversewn when the patient has a surgical Maze procedure.  Therefore,  this may in fact have been done.  However, I do not see it outlined in  the operative report.   At this point, we will continue to have the patient take Coumadin for  several reasons.  1. She has had atrial fibrillation in the past.  2. She has had some atrial fibrillation since her surgery.  3. She would like to be on Coumadin.  4. There is no absolute contraindication.  5. I cannot at this point document that her atrial appendage was      removed.   Therefore, I will continue Coumadin.  If she develops a soft  contraindication, we will consider stopping the Coumadin.  Otherwise, we  will continue.  This issue, of course, can be re-reviewed over time.  Another approach may be to contact the [facility] to see if they can  help any further on whether she has an atrial appendage or not.     [name], MD, [facility]  Electronically Signed    JDK/MedQ  DD: 08/13/2007  DT: 08/13/2007  Job #: [id number]

## 2011-01-18 ENCOUNTER — Encounter: Payer: Self-pay | Admitting: Pulmonary Disease

## 2011-01-19 NOTE — Progress Notes (Signed)
Heather, please change carvedilol to metoprolol 12.5 po BID.

## 2011-01-19 NOTE — Progress Notes (Signed)
Thanks for the help and advice. I will switch her to metoprolol.

## 2011-01-20 ENCOUNTER — Telehealth: Payer: Self-pay | Admitting: *Deleted

## 2011-01-20 NOTE — Procedures (Signed)
[place]  Patient:    Anita, Duke                      MRN: [id number] Proc. Date: 04/16/00 Adm. Date:  [id number] Attending:  Learta Codding CC:         [name], M.D. [place]   Procedure Report  REFERRING PHYSICIAN:  [name], M.D. [facility]  OPERATOR:  Lewayne Bunting, M.D.  PROCEDURE:  Trans-video echocardiographic study.  HISTORY OF PRESENT ILLNESS:  Anita Duke is a 70 year old white female with a history of mitral valve prolapse syndrome and atrial fibrillation.  The patient is status post mitral valve repair as well as maze procedure and coronary artery bypass grafting with a LIMA to the LAD performed at the [facility] approximately eight weeks ago.  The patient has developed recurrent atrial flutter and atrial fibrillation.  She is currently maintained on Dilacor and digoxin.  She has been scheduled for a transesophageal echocardiographic study to be followed by possible cardioversion.  SEDATION: 1. Intravenous Versed 3 mg total. 2. Intravenous 37.5 mg Phenergan total.  COMPLICATIONS:  None.  DESCRIPTION OF PROCEDURE:  After informed consent was obtained the patient was brought to the endoscopy suite.  The posterior pharynx was anesthetized with Xylocaine spray.  Subsequent to this, and after adequate sedation was obtained, a transesophageal probe was placed in the mid esophagus, and images of various views were obtained.  No transgastric views were obtained during this study.  The procedure was performed without complication, particularly, no bleeding was noted.  FINDINGS:  There appeared to be mild left ventricular enlargement with mild to moderate left ventricular systolic dysfunction with global hypokinesis.  There was at least moderate left atrial enlargement.  There was a significant amount of stasis noted in the left atrium.  The left atrial appendage was somewhat difficult to visualize, particularly the orifice of  the left atrial appendage was poorly visualized secondary to acoustic shadowing from the mitral valve repair.  However, there appears to be an oscillating mass adherent to the wall of the left atrial appendage around thea area where there was a large amount of stasis seen.  Mitral valve was mildly thickened.  Echogenicity was noted off the mitral annulus, probably consistent with ring placement.  The posterior leaflet appeared to be somewhat fixed.  There was only trace mitral regurgitation.  The aortic valve was thickened.  There was no aortic insufficiency.  Right ventricle appears to be normal in size with normal RV contraction.  There was mild right atrial enlargement.  There was mild tricuspid regurgitation.  There was no evidence of intracardiac shunt at the level of the fossa ovalis.  There was otherwise an intact intra-atrial septum. No pericardial effusions were seen during this study.  CONCLUSION: 1. 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 2. Status post successful mitral valve repair with only trace mitral    regurgitation. 3. Mild to moderate left ventricular systolic dysfunction and global    hypokinesis.  RECOMMENDATIONS:  The results have been discussed with Dr. Myrtis Ser.  The plan will be to keep the patient anticoagulated with levels around 2.5-3.  The likely etiology for the patients left atrial thrombus is the marked stasis in the setting of atrial fibrillation and prior endovascular injury from the maze procedure.  A plan is also to improve rate control of the patients atrial fibrillation, and Tenormin 50 mg  q.d. will be started.  The results have been related to the patient and her husband.  Dr. Myrtis Ser will follow up with the patient this Thursday, April 19, 2000, at the [place] office. DD:  04/16/00 TD:  04/16/00 Job: [id number]

## 2011-01-20 NOTE — Assessment & Plan Note (Signed)
[facility]                            CARDIOLOGY OFFICE NOTE   Anita, Duke                      MRN:          [id number]  DATE:12/18/2006                            DOB:          03/24/1941    Anita Duke is seen for cardiology followup.  She is actually doing very  well.  She has a history of mitral valve repair done at the [facility].  Since that time, she has done very well.  She did have a Maze procedure.  She is in sinus rhythm.  She has very tiny P waves.  There has been some  atrial fibrillation since her surgery and she has remained on Coumadin.  Today, I looked to see if I could tell if her left atrial appendage was  tied off or removed.  I do not see this outlined in the operative report  but we will check.  If the appendage has been removed or tied off, we  will consider whether we should keep her on Coumadin.  She tolerates it  well and wants to be on Coumadin.  However, I want to be sure that she  does not have a bleeding risk from Coumadin unless we feel clearly that  it is appropriate for her to be on the drug.   MEDICATIONS:  1. Actos 45.  2. Mavik 4.  3. Coumadin as directed.  4. Diltiazem ER 240 b.i.d.  5. Furosemide 40.  6. Potassium.  7. Lanoxin 0.25.  8. Lipitor 40.  9. Aspirin 81.  10.Effexor.  11.Fish oil.  12.Calcium.   PAST MEDICAL HISTORY:   ALLERGIES:  TETANUS, DEMEROL, TOPROL, MORPHINE.   OTHER MEDICAL PROBLEMS:  See the complete list below.   REVIEW OF SYSTEMS:  She is feeling fine.  She has no chest pain or  shortness of breath.  She has no complaints.  Review of systems is  negative.   PHYSICAL EXAMINATION:  VITAL SIGNS:  Weight is 174 pounds.  Blood  pressure is 140/70 with a pulse of 70.  GENERAL:  The patient is oriented to person, time, and place, and her  affect is normal.  HEENT:  No xanthelasma.  She has normal extraocular motion.  There are  no carotid bruits.  There is no jugular  venous distention.  LUNGS:  Clear.  Respiratory effort is not labored.  CARDIAC:  Exam reveals an S1 with an S2.  There are no clicks or  significant murmurs.  There is no murmur of mitral regurgitation.  ABDOMEN:  Soft.  There are no masses or bruits.  There is no peripheral  edema.   EKG reveals sinus rhythm with tine P waves.  She had labs drawn on December 12, 2006.  Her glucose was high.  Her LDL was 64 with an HDL of 52 and  triglycerides 200.  Hemoglobin A1c was elevated.  Dr. Elisabeth Most is aware  and looking into this.   PROBLEM LIST:  1. Hypertension, controlled.  2. History of cholecystectomy.  3. Elevated cholesterol and this is being treated.  4. Mild coronary disease with  a left internal mammary artery to the      left anterior descending at the time of her mitral surgery.  5. History of severe mitral regurgitation treated with mitral valve      repair and a ring in 2001.  6. History of atrial fibrillation that was treated with a Maze      procedure at the time of surgery.  It appeared when I saw her in      2006, that she had had some racing atrial fibrillation.  She was      asymptomatic.  She has been continued on Coumadin because of this.  7. Coumadin.  Up to this point, I had considered using it      indefinitely.  However, I will re-research whether or not she still      has the left atrial appendage.  If not, we may reconsider.  8. Question of a rash from Vicodin, Lotensin, and Toprol.  9. Question of some rectal bleeding in the past assessed by Dr.      Arlyce Dice.  10.Elevated glucose, being treated.   Anita Duke is stable.  I will see her back in one year.  I will  research whether she has the left atrial appendage or not.     Anita Abed, MD, [facility]  Electronically Signed    JDK/MedQ  DD: 12/18/2006  DT: 12/18/2006  Job #: [id number]   cc:   Lovenia Kim, D.O.

## 2011-01-20 NOTE — Op Note (Signed)
NAMECHANIYAH, JAHR               ACCOUNT NO.:  [id number]   MEDICAL RECORD NO.:  [id number]          PATIENT TYPE:  OIB   LOCATION:  2899                         FACILITY:  [facility]   PHYSICIAN:  Rose Phi. Maple Hudson, M.D.   DATE OF BIRTH:  1940-12-03   DATE OF PROCEDURE:  04/07/2005  DATE OF DISCHARGE:  04/07/2005                                 OPERATIVE REPORT   PREOPERATIVE DIAGNOSIS:  Radial scar, left breast.   POSTOPERATIVE DIAGNOSIS:  Radial scar, left breast.   OPERATION/PROCEDURE:  Excision of left breast mass with needle-localization  and specimen mammogram.   SURGEON:  Rose Phi. Maple Hudson, M.D.   ANESTHESIA:  General.   OPERATION/PROCEDURE:  The patient was placed on the operating table and the  left breast prepped and draped in the usual fashion. Using the previously  placed wire as a guide, we made a curved incision in the upper portion of  the left breast and then exposed the wire into the incision and then widely  excised the wire and surrounding tissue.  Hemostasis was obtained with the  cautery.  Subcuticular closure with 4-0 Monocryl and Steri-Strips carried  out.   SPECIMENS:  Mammogram confirmed the removal of the wire and the appropriate  tissue.   A dressing was applied and the patient transferred to the recovery room in  satisfactory condition having tolerated the procedure well.       PRY/MEDQ  D:  04/07/2005  T:  04/08/2005  Job:  [id number]

## 2011-01-20 NOTE — Consult Note (Signed)
Anita Duke, Anita Duke               ACCOUNT NO.:  [id number]   MEDICAL RECORD NO.:  [id number]          PATIENT TYPE:  OIB   LOCATION:  2899                         FACILITY:  [facility]   PHYSICIAN:  Learta Codding, M.D. LHCDATE OF BIRTH:  [birth year]   DATE OF CONSULTATION:  04/07/2005  DATE OF DISCHARGE:                                   CONSULTATION   REASON FOR CONSULTATION:  Onset of atrial fibrillation after breast biopsy.   HISTORY OF PRESENT ILLNESS:  The patient is a 70 year old female with a  history of severe mitral regurgitation secondary to mitral valve prolapse.  The patient is status post mitral valve repair and Maze procedure at the  [facility] in 2001.  I actually performed a 2D on this patient eight weeks  post the Maze procedure when she developed recurrent atrial fibrillation.  She was found to have some atrial flutter in the base of the left atrium and  we held off on cardioversion.  However, in follow up, the patient returned  back to normal sinus rhythm and according to Dr. Henrietta Hoover notes, have been  more recently in normal sinus rhythm.  Previously with atrial fibrillation,  the patient had associated presyncope.  She is now post breast biopsy and  was noted on telemetry to have atrial fib with rate control.  She states  that she is asymptomatic.  She does not feel any palpitations.  As far as  she can tell, she is not aware of irregular heart rhythm.  She did stop her  Coumadin four days ago prior to her breast biopsy.  The plan is to resume  her Coumadin tomorrow.  Currently, her heart rate is at 68 to 70 beats per  minute and asymptomatic.   ALLERGIES:  Morphine sulfate, tetanus, Demerol, and Toprol.   MEDICATIONS:  Calcium b.i.d., Diltiazem 240 mg p.o. b.i.d., Furosemide 40 mg  p.o. daily, potassium tablets, Lanoxin 0.25 mg daily, aspirin 81 mg daily,  Effexor 37.5 mg daily, Coumadin as directed, Lipitor, and Actos.   PAST MEDICAL HISTORY:  See problem list  below.   PAST SURGICAL HISTORY:  CABG, mitral valve replacement, cholecystectomy, and  hysterectomy.   SOCIAL HISTORY:  The patient denies any tobacco use, denies alcohol use.   FAMILY HISTORY:  Notable for father died from coronary artery disease.  Sister had diabetes mellitus and is deceased.   REVIEW OF SYSTEMS:  No fevers, chills, night sweats.  No chest pain,  shortness of breath, orthopnea, PND.  No frequency or dysuria.  No weakness  or numbness.  No nausea and vomiting.   PHYSICAL EXAMINATION:  VITAL SIGNS:  Blood pressure 128/42, heart rate 67 beats per minute,  respirations are 17, temperature 97.4, saturation 93% on room air.  GENERAL:  Well nourished white female in no apparent distress.  HEENT:  Pupils equal, round, reactive to light and accommodation.  NECK:  Supple, normal carotid upstrokes, no carotid bruits.  HEART:  Regular rate and rhythm, normal S1 and S2, no S3, PMI not displaced.  LUNGS:  Clear breath sounds bilaterally.  ABDOMEN:  Soft.  GU AND RECTAL:  Examination deferred.  EXTREMITIES:  No cyanosis, clubbing, and edema.  NEUROLOGICAL:  Alert and oriented, grossly nonfocal exam.   Chest x-ray not available.  EKG is currently still pending.   LABORATORY DATA:  Hemoglobin 13.3, hematocrit 39.4, white count 10.6,  platelet count 343.  Sodium 141, potassium 4, BUN 13, creatinine 0.7,  glucose 105.   PROBLEM LIST:  1.  Status post breast biopsy.  2.  Recurrent atrial fibrillation with rate control post number 1.  3.  History of severe mitral regurgitation and mitral valve prolapse.      1.  Status post mitral valve repair and Maze procedure in 2001.      2.  Postoperative atrial fibrillation eight weeks post procedure.  4.  History of mild coronary artery disease status post left internal      mammary artery to the left anterior descending.  5.  Chronic Coumadin anticoagulation secondary to history of atrial      fibrillation.   PLAN:  The patient  appears to have developed recurrent atrial fibrillation  but she is asymptomatic.  Her rate is controlled.  Her vital signs are  stable.  At this point in time, I do  think the patient can return to home  and continue on her usual medications.  I have asked her also to resume her  Coumadin tomorrow.  She will be scheduled with a follow up appointment with  Dr. Myrtis Ser in the next couple of weeks.  If she remains in atrial  fibrillation, consideration could be given to future cardioversion but I  anticipate the patient likely will revert back to normal sinus rhythm and  atrial fibrillation may be more paroxysmal in nature.  I have the explained  to the patient and she will follow up with [name] earlier if she has any other  problems.      Learta Codding, M.D. [facility]  Electronically Signed     GED/MEDQ  D:  04/07/2005  T:  04/07/2005  Job:  [id number]   cc:   Rose Phi. Young, M.D.  [address], [address]  [place]  [place] [address]   Lovenia Kim, D.O.  [address]  [place]  [place] [address]  Fax: [phone]   [name], M.D.  [address]  [address]  [place]  [place] [address]

## 2011-01-20 NOTE — Assessment & Plan Note (Signed)
[facility]                         GASTROENTEROLOGY OFFICE NOTE   VANNIE, HILGERT                      MRN:          [id number]  DATE:09/14/2006                            DOB:          1940-12-14    PROBLEM:  Choking.   Anita Duke is a pleasant 70 year old white female, referred through  the courtesy of Dr. Elisabeth Most for evaluation.  For years she has been  suffering from episodes of choking.  She will have spontaneous choking  sensation where she has difficulty breathing, with a sensation of food  going down the wrong pipe.  This occurs, however, in the absence of  eating or drinking.  She may occasionally awaken with the same  complaint.  She denies dysphagia, sore throat or coughing.  She  underwent a swallowing study in 1996 that was unremarkable.   PAST MEDICAL HISTORY:  Pertinent for hypertension, arrhythmias, and  diabetes.  She is status post CABG, cholecystectomy, hysterectomy, and  mitral valve surgery.   FAMILY HISTORY:  Noncontributory.   MEDICATIONS:  1. Calcium.  2. Diltiazem.  3. Lasix.  4. Potassium.  5. Digoxin.  6. Lipitor.  7. Effexor.  8. Actos.  9. Mavik.  10.Coumadin.   SHE IS ALLERGIC TO TETANUS, TEMARIL AND MORPHINE.   She neither smokes or drinks, she is married and works as an  Comptroller.  Review of systems was reviewed and is negative.   PHYSICAL EXAMINATION:  Pulse 68, blood pressure 122/72, weight 175.  HEENT: EOMI. PERRLA. Sclerae are anicteric.  Conjunctivae are pink.  NECK:  Supple without thyromegaly, adenopathy or carotid bruits.  CHEST:  Clear to auscultation and percussion without adventitious  sounds.  CARDIAC:  Regular rhythm; normal S1 S2.  There are no murmurs, gallops  or rubs.  ABDOMEN:  Bowel sounds are normoactive.  Abdomen is soft, non-tender and  non-distended.  There are no abdominal masses, tenderness, splenic  enlargement or hepatomegaly.  EXTREMITIES:  Full  range of motion.  No cyanosis, clubbing or edema.  RECTAL:  Deferred.   IMPRESSION:  1. Intermittent laryngospasm.  What is triggering this is not clear.      She may be suffering from post nasal drip.  2. Coronary artery disease.  3. Diabetes.  4. Status post mitral valve surgery.   RECOMMENDATION:  1. Ear, nose, and throat evaluation.  2. No further gastrointestinal studies at this time.     Barbette Hair. Arlyce Dice, MD,FACG  Electronically Signed    RDK/MedQ  DD: 09/14/2006  DT: 09/14/2006  Job #: [id number]   cc:   Lovenia Kim, D.O.  [name], MD, [facility]  Lucky Cowboy, MD

## 2011-01-20 NOTE — Telephone Encounter (Signed)
Pt saw Dr Craige Cotta who recommended maybe changed from carvedilol to another med, per Dr Myrtis Ser change pt to Metoprolol 12.5 mg bid, pt is aware, however she was on Metoprolol years ago and could not tolerate it, it gave her a horrible headache for 10 days that never went away and she was very emotional, so she does not want to try it again, will let Dr Myrtis Ser know and call her back next week

## 2011-01-23 NOTE — Telephone Encounter (Signed)
Pt aware she will stop med, she is sch to see Dr Myrtis Ser on 6/14 and will keep that appt

## 2011-01-23 NOTE — Telephone Encounter (Signed)
Stop carvedilol and do not restart another beta blocker.

## 2011-01-26 ENCOUNTER — Ambulatory Visit (INDEPENDENT_AMBULATORY_CARE_PROVIDER_SITE_OTHER): Payer: BC Managed Care – PPO | Admitting: *Deleted

## 2011-01-26 ENCOUNTER — Encounter: Payer: Self-pay | Admitting: Pulmonary Disease

## 2011-01-26 DIAGNOSIS — I4891 Unspecified atrial fibrillation: Secondary | ICD-10-CM

## 2011-01-26 DIAGNOSIS — Z8679 Personal history of other diseases of the circulatory system: Secondary | ICD-10-CM

## 2011-01-26 LAB — POCT INR: INR: 2.1

## 2011-02-13 ENCOUNTER — Encounter: Payer: Self-pay | Admitting: Pulmonary Disease

## 2011-02-16 ENCOUNTER — Encounter: Payer: Self-pay | Admitting: Pulmonary Disease

## 2011-02-16 ENCOUNTER — Ambulatory Visit (INDEPENDENT_AMBULATORY_CARE_PROVIDER_SITE_OTHER): Payer: BC Managed Care – PPO | Admitting: Pulmonary Disease

## 2011-02-16 ENCOUNTER — Encounter: Payer: Self-pay | Admitting: Cardiology

## 2011-02-16 ENCOUNTER — Ambulatory Visit (INDEPENDENT_AMBULATORY_CARE_PROVIDER_SITE_OTHER): Payer: BC Managed Care – PPO | Admitting: *Deleted

## 2011-02-16 ENCOUNTER — Ambulatory Visit (INDEPENDENT_AMBULATORY_CARE_PROVIDER_SITE_OTHER): Payer: BC Managed Care – PPO | Admitting: Cardiology

## 2011-02-16 VITALS — BP 138/80 | HR 76 | Temp 98.0°F | Ht 62.0 in | Wt 188.6 lb

## 2011-02-16 VITALS — BP 150/75 | HR 72 | Ht 62.0 in | Wt 186.0 lb

## 2011-02-16 DIAGNOSIS — Z8679 Personal history of other diseases of the circulatory system: Secondary | ICD-10-CM

## 2011-02-16 DIAGNOSIS — R0602 Shortness of breath: Secondary | ICD-10-CM

## 2011-02-16 DIAGNOSIS — R0989 Other specified symptoms and signs involving the circulatory and respiratory systems: Secondary | ICD-10-CM

## 2011-02-16 DIAGNOSIS — I4891 Unspecified atrial fibrillation: Secondary | ICD-10-CM

## 2011-02-16 DIAGNOSIS — J4520 Mild intermittent asthma, uncomplicated: Secondary | ICD-10-CM | POA: Insufficient documentation

## 2011-02-16 DIAGNOSIS — J45909 Unspecified asthma, uncomplicated: Secondary | ICD-10-CM

## 2011-02-16 DIAGNOSIS — R9430 Abnormal result of cardiovascular function study, unspecified: Secondary | ICD-10-CM

## 2011-02-16 MED ORDER — BECLOMETHASONE DIPROPIONATE 80 MCG/ACT IN AERS: INHALATION_SPRAY | RESPIRATORY_TRACT | Status: DC

## 2011-02-16 MED ORDER — WARFARIN SODIUM 5 MG PO TABS: 5.0000 mg | ORAL_TABLET | ORAL | Status: DC

## 2011-02-16 NOTE — Patient Instructions (Signed)
Your physician wants you to follow-up in:  6 months. You will receive a reminder letter in the mail two months in advance. If you don't receive a letter, please call our office to schedule the follow-up appointment.   

## 2011-02-16 NOTE — Patient Instructions (Signed)
Qvar one puff in the morning and two puffs at night for two weeks.  If okay, then Qvar two puffs at night Follow up in 6 months

## 2011-02-16 NOTE — Assessment & Plan Note (Signed)
Shortness of breath has greatly improved.  I believe that her pulmonary issue was the main issue.  She is to followup with Dr. Craige Cotta today.  I appreciate the help.  No change in her cardiac meds.

## 2011-02-16 NOTE — Assessment & Plan Note (Signed)
She has improved.  Will have her gradual decrease her inhaler regimen as tolerated.

## 2011-02-16 NOTE — Assessment & Plan Note (Signed)
This is likely multifactorial related to allergic .  She has history of hypertension with diastolic dysfunction, as well as valvular heart disease.  These are being managed by cardiology.  She is obese, and likely has a component of deconditioning  She has a history of allergies, and recent pulmonary function test showed bronchodilator responsiveness.  She also had bronchitic changes on recent chest xray.  I will give her a trial of Qvar and as needed proair.  She is to continue her sinus regimen.  Of note is that she is on coreg.  If she does indeed have asthma, then she may need to be changed to a more cardioselective beta blocker.

## 2011-02-16 NOTE — Progress Notes (Signed)
HPI Patient is seen today for cardiology followup.  I had seen her on December 23, 2010.  She had shortness of breath.  It seems that time that he was not cardiac and she was referred for pulmonary evaluation.  She had a very nice evaluation and there was some bronchodilator response to her PFTs.  She was started on medications for this and she returns today.  She says she feels much better.  She's not having any chest pain.  Her shortness of breath with exertion is greatly improved.  The patient's carvedilol was stopped.  Her LV had improved and she has not tolerated metoprolol in the past.  Therefore no beta blocker was restarted Allergies  Allergen Reactions  . Meperidine Hcl   . Morphine   . Tetanus Toxoid     Current Outpatient Prescriptions  Medication Sig Dispense Refill  . albuterol (PROAIR HFA) 108 (90 BASE) MCG/ACT inhaler Two puffs up to four times per day as needed  1 Inhaler  6  . aspirin 81 MG tablet Take 81 mg by mouth daily.        Marland Kitchen atorvastatin (LIPITOR) 40 MG tablet Take 40 mg by mouth daily.        . beclomethasone (QVAR) 80 MCG/ACT inhaler Two puffs twice per day  1 Inhaler  6  . calcium carbonate (OS-CAL) 600 MG TABS Take 600 mg by mouth daily.       . Cholecalciferol (VITAMIN D3) 1000 UNITS CAPS Take 4 capsules by mouth daily.        Marland Kitchen dofetilide (TIKOSYN) 500 MCG capsule Take 500 mcg by mouth 2 (two) times daily.        . fish oil-omega-3 fatty acids 1000 MG capsule Take 2 g by mouth 2 (two) times daily.        . furosemide (LASIX) 40 MG tablet Take 40 mg by mouth daily.        . pioglitazone (ACTOS) 15 MG tablet Take 15 mg by mouth daily.        . trandolapril (MAVIK) 4 MG tablet Take 8 mg by mouth daily.        Marland Kitchen venlafaxine (EFFEXOR) 37.5 MG tablet Take 37.5 mg by mouth daily.        Marland Kitchen DISCONTD: warfarin (COUMADIN) 5 MG tablet Take by mouth as directed.        . carvedilol (COREG) 6.25 MG tablet Take 6.25 mg by mouth 2 (two) times daily with a meal.        .  warfarin (COUMADIN) 5 MG tablet Take 1 tablet (5 mg total) by mouth as directed.  30 tablet  1    History   Social History  . Marital Status: Married    Spouse Name: N/A    Number of Children: N/A  . 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     Social History Main Topics  . Smoking status: Never Smoker   . Smokeless tobacco: Never Used  . Alcohol Use: No  . Drug Use: No  . Sexually Active: Not on file   Other Topics Concern  . Not on file   Social History Narrative   Does not et regular exerciseDaily caffeine use    Family History  Problem Relation Age of Onset  . Cancer Maternal Grandfather   . Heart disease Father   . Heart disease Sister   . Diabetes Mother   .  Diabetes Father   . Diabetes Sister   . Diabetes Paternal Grandfather   . Diabetes Paternal Grandmother   . Heart disease Paternal Uncle     Past Medical History  Diagnosis Date  . DM (diabetes mellitus)   . Atrial fibrillation     Treated with maze procedure / recurrent atrial fibrillation February, 2011... TEE cardioversion October 19, 2009  . Atrial tachycardia     February, 2011... Tikosyn started in hospital  . Bradycardia     Post cardioversion February, 2011,/digoxin/Cardizem metoprolol stopped, may need pacemaker  . Mitral regurgitation     Treated with repair, atrial appendage was removed or tied off at surgery.. this was proven by TEE February, 2011  . Pulmonary HTN     55 mmHg, echo, February, 2011 / no mention of pulmonary hypertension echo, June, 2011  . Right ventricular dysfunction     Mild to moderate, echo, February, 2011 / normalized echo, June, 2011  . TR (tricuspid regurgitation)     Moderate, echo, February, 2011 / trivial, echo, June, 2011  . Mitral stenosis     Mild, February, 2011, post mitral valve repair / mild, echo, June, 2011  . CAD (coronary artery disease)     LIMA to the LAD at time of mitral valve repair / LIMA  atretic,, February, 2011  . Ejection fraction < 50%     EF 30% echo and TEE diagnosis February, 2011, possibly rate related / no contraction by catheter, bradycardia so carvedilol cannot be used / EF 55-60% echo, June, 2011 / EF 50%, echo, February, 2012  . HTN (hypertension)   . Hemorrhoids, internal   . Diverticulosis of colon   . Colon polyp, hyperplastic   . Environmental allergies   . atrial appendage     Removed were tied off at surgery, proven by TEE February, 2011  . S/P mitral valve repair     [facility] / Maze procedure/ atrial appendage removed were tied off  . Hx of CABG     At time of mitral valve repair  . Rash     ?Vicodin, Lotensin, Toprol  . CHF (congestive heart failure)     Mild in hospital February, 2011  . Drug therapy     Tikosyn  . Shortness of breath     Evaluated by Dr. Craige Cotta. Mat [id number].... bronchodilator response,,  medication started  /  patient much improved June 14, 201 2    Past Surgical History  Procedure Date  . Atrial appendage removed   . Mitral valve repair   . Coronary artery bypass graft   . Maze   . Vaginal hysterectomy   . Cholecystectomy   . Breast lumpectomy     x 2    ROS  Patient denies fever, chills, headache, sweats, rash, change in vision, change in hearing, chest pain, cough, nausea vomiting, urinary symptoms.  All other systems are reviewed and are negative.  PHYSICAL EXAM Patient is oriented to person time and place.  Affect is normal.  Head is atraumatic.  There is no xanthelasma.  There is no jugular venous distention.  Lungs are clear.  Respiratory effort is not labored.  Cardiac exam reveals S1-S2.  There is no significant murmur.  Her abdomen is soft.  She has no peripheral edema. Filed Vitals:   02/16/11 0928  BP: 150/75  Pulse: 72  Height: 5\' 2"  (1.575 m)  Weight: 186 lb (84.369 kg)    EKG Is done today.  She has  sinus rhythm with PACs.  Her corrected QT interval is 470 ms.  ASSESSMENT & PLAN

## 2011-02-16 NOTE — Progress Notes (Signed)
Subjective:    Patient ID: Anita Duke, female    DOB: 1941-05-18, 70 y.o.   MRN: [id number]  HPI 70 yo female with dyspnea in setting of allergic asthma, diastolic dysfunction, and obesity/deconditioning.  She has been doing better.  She is not having much cough or wheeze.  She feels her exercise tolerate has improved since starting Qvar.  She is not having any trouble with her sinuses or throat.  She has not needed to use proair since starting Qvar.    She was seen by Dr. Myrtis Ser earlier today, and had her coreg stopped.  Past Medical History  Diagnosis Date  . DM (diabetes mellitus)   . Atrial fibrillation     Treated with maze procedure / recurrent atrial fibrillation February, 2011... TEE cardioversion October 19, 2009  . Atrial tachycardia     February, 2011... Tikosyn started in hospital  . Bradycardia     Post cardioversion February, 2011,/digoxin/Cardizem metoprolol stopped, may need pacemaker  . Mitral regurgitation     Treated with repair, atrial appendage was removed or tied off at surgery.. this was proven by TEE February, 2011  . Pulmonary HTN     55 mmHg, echo, February, 2011 / no mention of pulmonary hypertension echo, June, 2011  . Right ventricular dysfunction     Mild to moderate, echo, February, 2011 / normalized echo, June, 2011  . TR (tricuspid regurgitation)     Moderate, echo, February, 2011 / trivial, echo, June, 2011  . Mitral stenosis     Mild, February, 2011, post mitral valve repair / mild, echo, June, 2011  . CAD (coronary artery disease)     LIMA to the LAD at time of mitral valve repair / LIMA atretic,, February, 2011  . Ejection fraction < 50%     EF 30% echo and TEE diagnosis February, 2011, possibly rate related / no contraction by catheter, bradycardia so carvedilol cannot be used / EF 55-60% echo, June, 2011 / EF 50%, echo, February, 2012  . HTN (hypertension)   . Hemorrhoids, internal   . Diverticulosis of colon   . Colon polyp, hyperplastic    . Environmental allergies   . atrial appendage     Removed were tied off at surgery, proven by TEE February, 2011  . S/P mitral valve repair     [facility] / Maze procedure/ atrial appendage removed were tied off  . Hx of CABG     At time of mitral valve repair  . Rash     ?Vicodin, Lotensin, Toprol  . CHF (congestive heart failure)     Mild in hospital February, 2011  . Drug therapy     Tikosyn  . Shortness of breath     Evaluated by Dr. Craige Cotta. Mat [id number].... bronchodilator response,,  medication started  /  patient much improved June 14, 201 2     Family History  Problem Relation Age of Onset  . Cancer Maternal Grandfather   . Heart disease Father   . Heart disease Sister   . Diabetes Mother   . Diabetes Father   . Diabetes Sister   . Diabetes Paternal Grandfather   . Diabetes Paternal Grandmother   . Heart disease Paternal Uncle      History   Social History  . Marital Status: Married    Spouse Name: N/A    Number of Children: N/A  . Years of Education: N/A   Occupational History  . 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     Social History Main Topics  . Smoking status: Never Smoker   . Smokeless tobacco: Never Used  . Alcohol Use: No  . Drug Use: No  . Sexually Active: Not on file   Other Topics Concern  . Not on file   Social History Narrative   Does not et regular exerciseDaily caffeine use     Allergies  Allergen Reactions  . Meperidine Hcl   . Morphine   . Tetanus Toxoid      Outpatient Prescriptions Prior to Visit  Medication Sig Dispense Refill  . albuterol (PROAIR HFA) 108 (90 BASE) MCG/ACT inhaler Two puffs up to four times per day as needed  1 Inhaler  6  . aspirin 81 MG tablet Take 81 mg by mouth daily.        Marland Kitchen atorvastatin (LIPITOR) 40 MG tablet Take 40 mg by mouth daily.        . calcium carbonate (OS-CAL) 600 MG TABS Take 600 mg by mouth daily.       . Cholecalciferol (VITAMIN D3) 1000 UNITS CAPS Take 4 capsules by mouth daily.         Marland Kitchen dofetilide (TIKOSYN) 500 MCG capsule Take 500 mcg by mouth 2 (two) times daily.        . fish oil-omega-3 fatty acids 1000 MG capsule Take 2 g by mouth 2 (two) times daily.        . furosemide (LASIX) 40 MG tablet Take 40 mg by mouth daily.        . pioglitazone (ACTOS) 15 MG tablet Take 15 mg by mouth daily.        . trandolapril (MAVIK) 4 MG tablet Take 8 mg by mouth daily.        Marland Kitchen venlafaxine (EFFEXOR) 37.5 MG tablet Take 37.5 mg by mouth daily.        . beclomethasone (QVAR) 80 MCG/ACT inhaler Two puffs twice per day  1 Inhaler  6  . carvedilol (COREG) 6.25 MG tablet Take 6.25 mg by mouth 2 (two) times daily with a meal.        . warfarin (COUMADIN) 5 MG tablet Take by mouth as directed.         Review of Systems     Objective:   Physical Exam  BP 138/80  Pulse 76  Temp(Src) 98 F (36.7 C) (Oral)  Ht 5\' 2"  (1.575 m)  Wt 188 lb 9.6 oz (85.548 kg)  BMI 34.50 kg/m2  SpO2 97%  General - no distress  HEENT - PERRLA, EOMI, no sinus tenderness, clear sinus drainage, no oral exudate, no LAN, no thyromegaly  Cardiac - s1s2 with 2/6 systolic murmur, pulses symmetric  Chest - no wheeze/rales/dullness  Abd - soft, nontender, normal bowel sounds  Ext - no e/c/c  Neuro - normal strength, CN intact, A&O x 3  Psych - normal mood/behavior  Skin - no rashes      Assessment & Plan:   ALLERGIC ASTHMA  She has improved.  Will have her gradual decrease her inhaler regimen as tolerated.  Updated Medication List Outpatient Encounter Prescriptions as of 02/16/2011  Medication Sig Dispense Refill  . albuterol (PROAIR HFA) 108 (90 BASE) MCG/ACT inhaler Two puffs up to four times per day as needed  1 Inhaler  6  . aspirin 81 MG tablet Take 81 mg by mouth daily.        Marland Kitchen atorvastatin (LIPITOR) 40 MG tablet Take 40 mg  by mouth daily.        . beclomethasone (QVAR) 80 MCG/ACT inhaler One puff in morning and two puffs at night for two weeks.  If okay, then two puffs at night.  1 Inhaler   6  . calcium carbonate (OS-CAL) 600 MG TABS Take 600 mg by mouth daily.       . Cholecalciferol (VITAMIN D3) 1000 UNITS CAPS Take 4 capsules by mouth daily.        Marland Kitchen dofetilide (TIKOSYN) 500 MCG capsule Take 500 mcg by mouth 2 (two) times daily.        . fish oil-omega-3 fatty acids 1000 MG capsule Take 2 g by mouth 2 (two) times daily.        . furosemide (LASIX) 40 MG tablet Take 40 mg by mouth daily.        . pioglitazone (ACTOS) 15 MG tablet Take 15 mg by mouth daily.        . trandolapril (MAVIK) 4 MG tablet Take 8 mg by mouth daily.        Marland Kitchen venlafaxine (EFFEXOR) 37.5 MG tablet Take 37.5 mg by mouth daily.        Marland Kitchen warfarin (COUMADIN) 5 MG tablet Take 1 tablet (5 mg total) by mouth as directed.  30 tablet  1  . DISCONTD: beclomethasone (QVAR) 80 MCG/ACT inhaler Two puffs twice per day  1 Inhaler  6  . DISCONTD: carvedilol (COREG) 6.25 MG tablet Take 6.25 mg by mouth 2 (two) times daily with a meal.        . DISCONTD: warfarin (COUMADIN) 5 MG tablet Take by mouth as directed.

## 2011-02-20 ENCOUNTER — Other Ambulatory Visit: Payer: Self-pay | Admitting: Internal Medicine

## 2011-02-20 DIAGNOSIS — Z1231 Encounter for screening mammogram for malignant neoplasm of breast: Secondary | ICD-10-CM

## 2011-03-16 ENCOUNTER — Ambulatory Visit (INDEPENDENT_AMBULATORY_CARE_PROVIDER_SITE_OTHER): Payer: BC Managed Care – PPO | Admitting: *Deleted

## 2011-03-16 DIAGNOSIS — I4891 Unspecified atrial fibrillation: Secondary | ICD-10-CM

## 2011-03-16 DIAGNOSIS — Z8679 Personal history of other diseases of the circulatory system: Secondary | ICD-10-CM

## 2011-03-17 ENCOUNTER — Encounter: Payer: Self-pay | Admitting: Cardiology

## 2011-03-20 ENCOUNTER — Encounter: Payer: Self-pay | Admitting: Cardiology

## 2011-04-05 IMAGING — CR DG CHEST 2V
2 series · 2 of 2 positions shown · non-contrast
Comparison: 04/06/2005

CLINICAL DATA: Shortness of breath

CHEST - 2 VIEW

[view not recorded (1 of 2)]
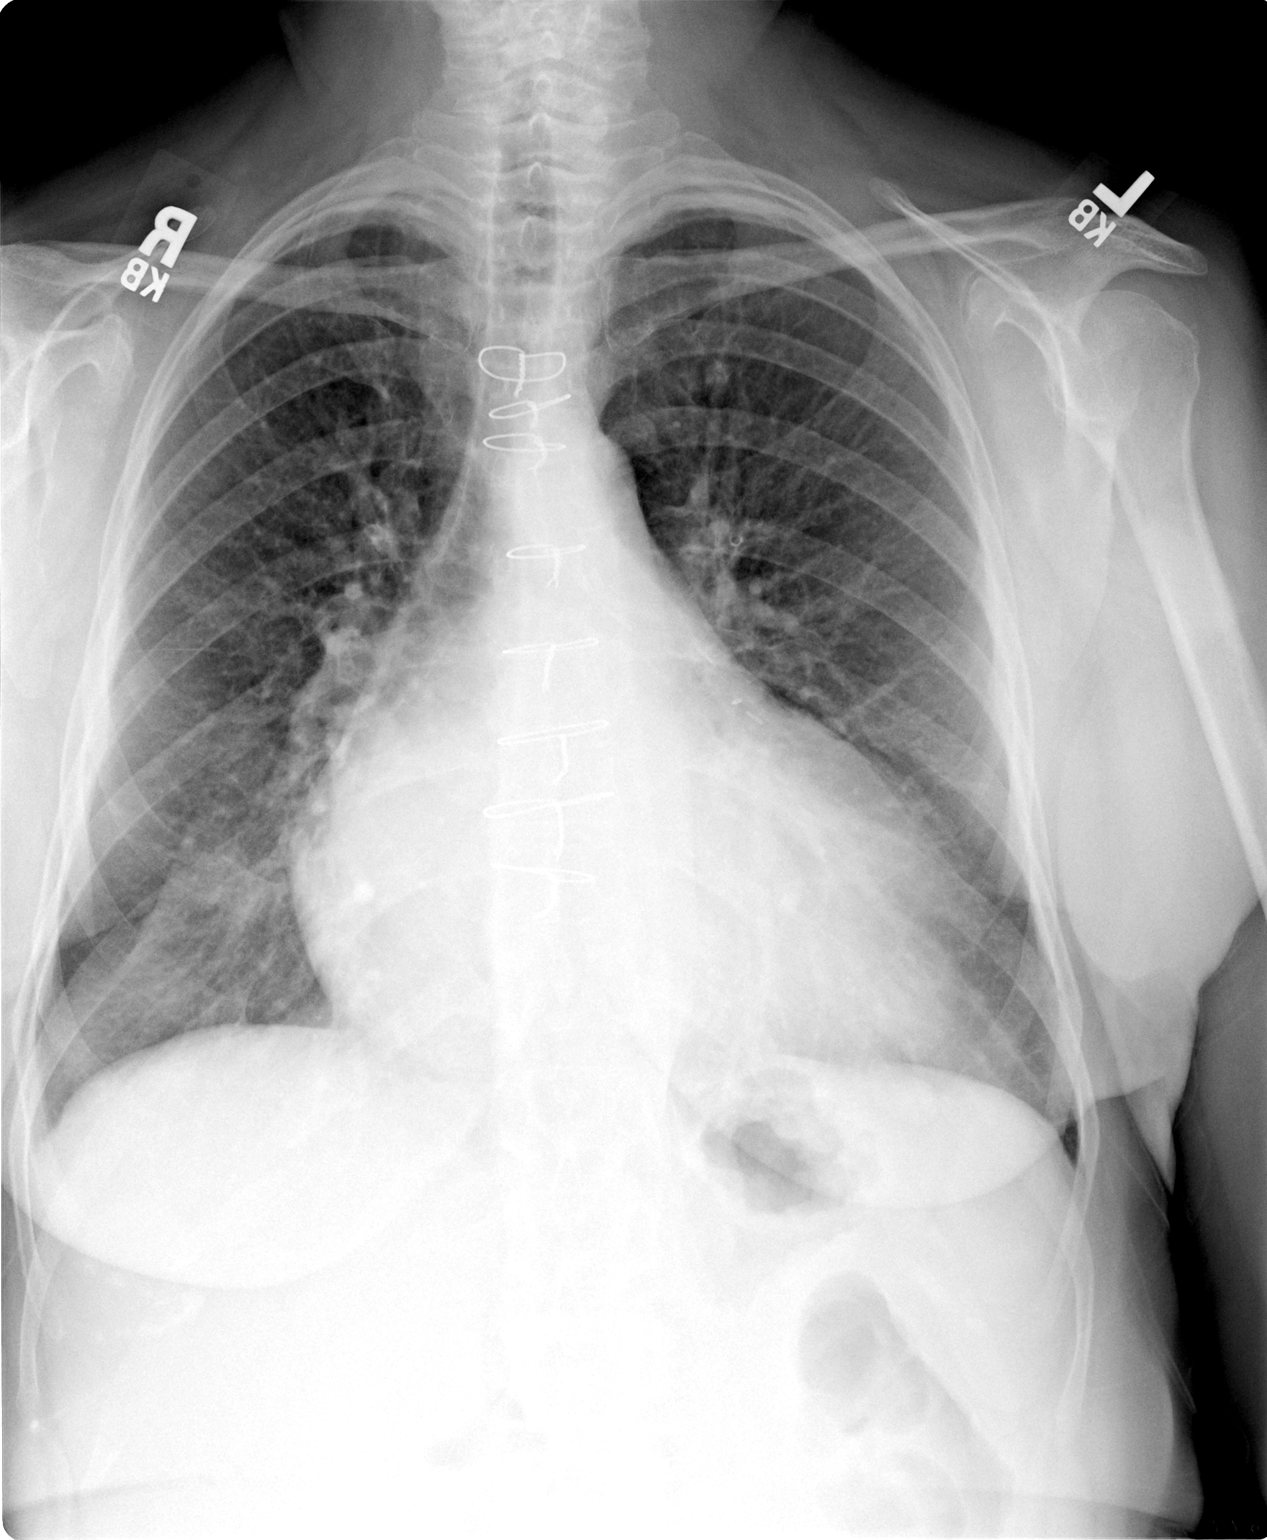

[view not recorded (2 of 2)]
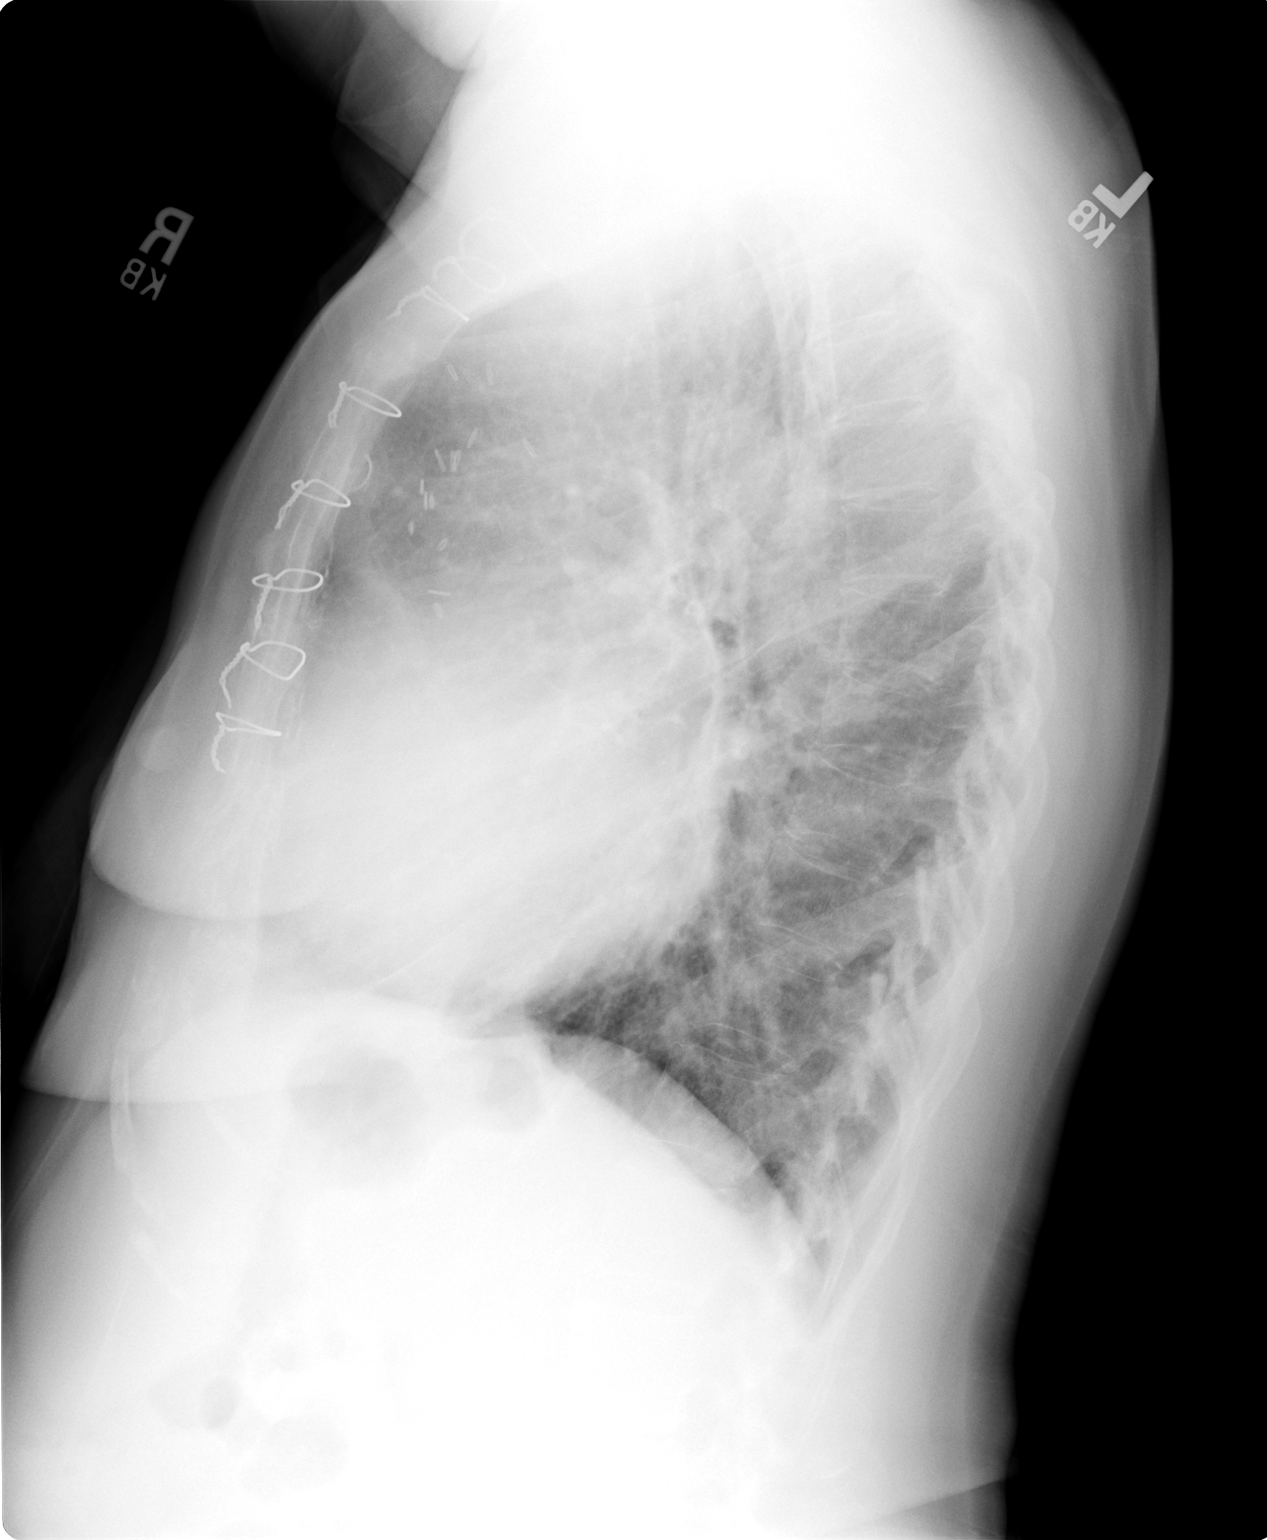

[2 of 2 positions shown; findings below may reference images not displayed]

FINDINGS: Marked enlargement of the cardiomediastinal silhouette
again noted with evidence of prior CABG.  Mild interstitial edema
is present without focal pulmonary opacity.  No pleural effusion.
IMPRESSION: Minimal interstitial edema, no focal abnormality.

Stable enlargement of the cardiomediastinal silhouette.

## 2011-04-06 ENCOUNTER — Ambulatory Visit
Admission: RE | Admit: 2011-04-06 | Discharge: 2011-04-06 | Disposition: A | Discharge status: Home / Self Care | Payer: BC Managed Care – PPO | Source: Ambulatory Visit | Attending: Internal Medicine | Admitting: Internal Medicine

## 2011-04-06 DIAGNOSIS — Z1231 Encounter for screening mammogram for malignant neoplasm of breast: Secondary | ICD-10-CM

## 2011-04-07 ENCOUNTER — Telehealth: Payer: Self-pay | Admitting: Cardiology

## 2011-04-07 NOTE — Telephone Encounter (Signed)
Per pt call, Pt PCP, Anita Duke and pt needs to find another PCP. Pt wanted to know if we and/or Dr. Myrtis Ser and his nurse had any recommendations.

## 2011-04-07 NOTE — Telephone Encounter (Signed)
Patient states he PCP Dr. Marisue Brooklyn is leaving the practice and she needs a PCP. Would like to know what Dr. Myrtis Ser recommends. Pt is aware that Dr. Myrtis Ser is not in the office, but she call the [facility] Dr. Rene Paci. Phone number given' patient verbalized understanding.

## 2011-04-08 IMAGING — CR DG CHEST 2V
2 series · 2 of 2 positions shown · non-contrast
Comparison: Chest x-ray of 10/13/2009

CLINICAL DATA: Cough, atrial fibrillation

CHEST - 2 VIEW

[w chest pa]
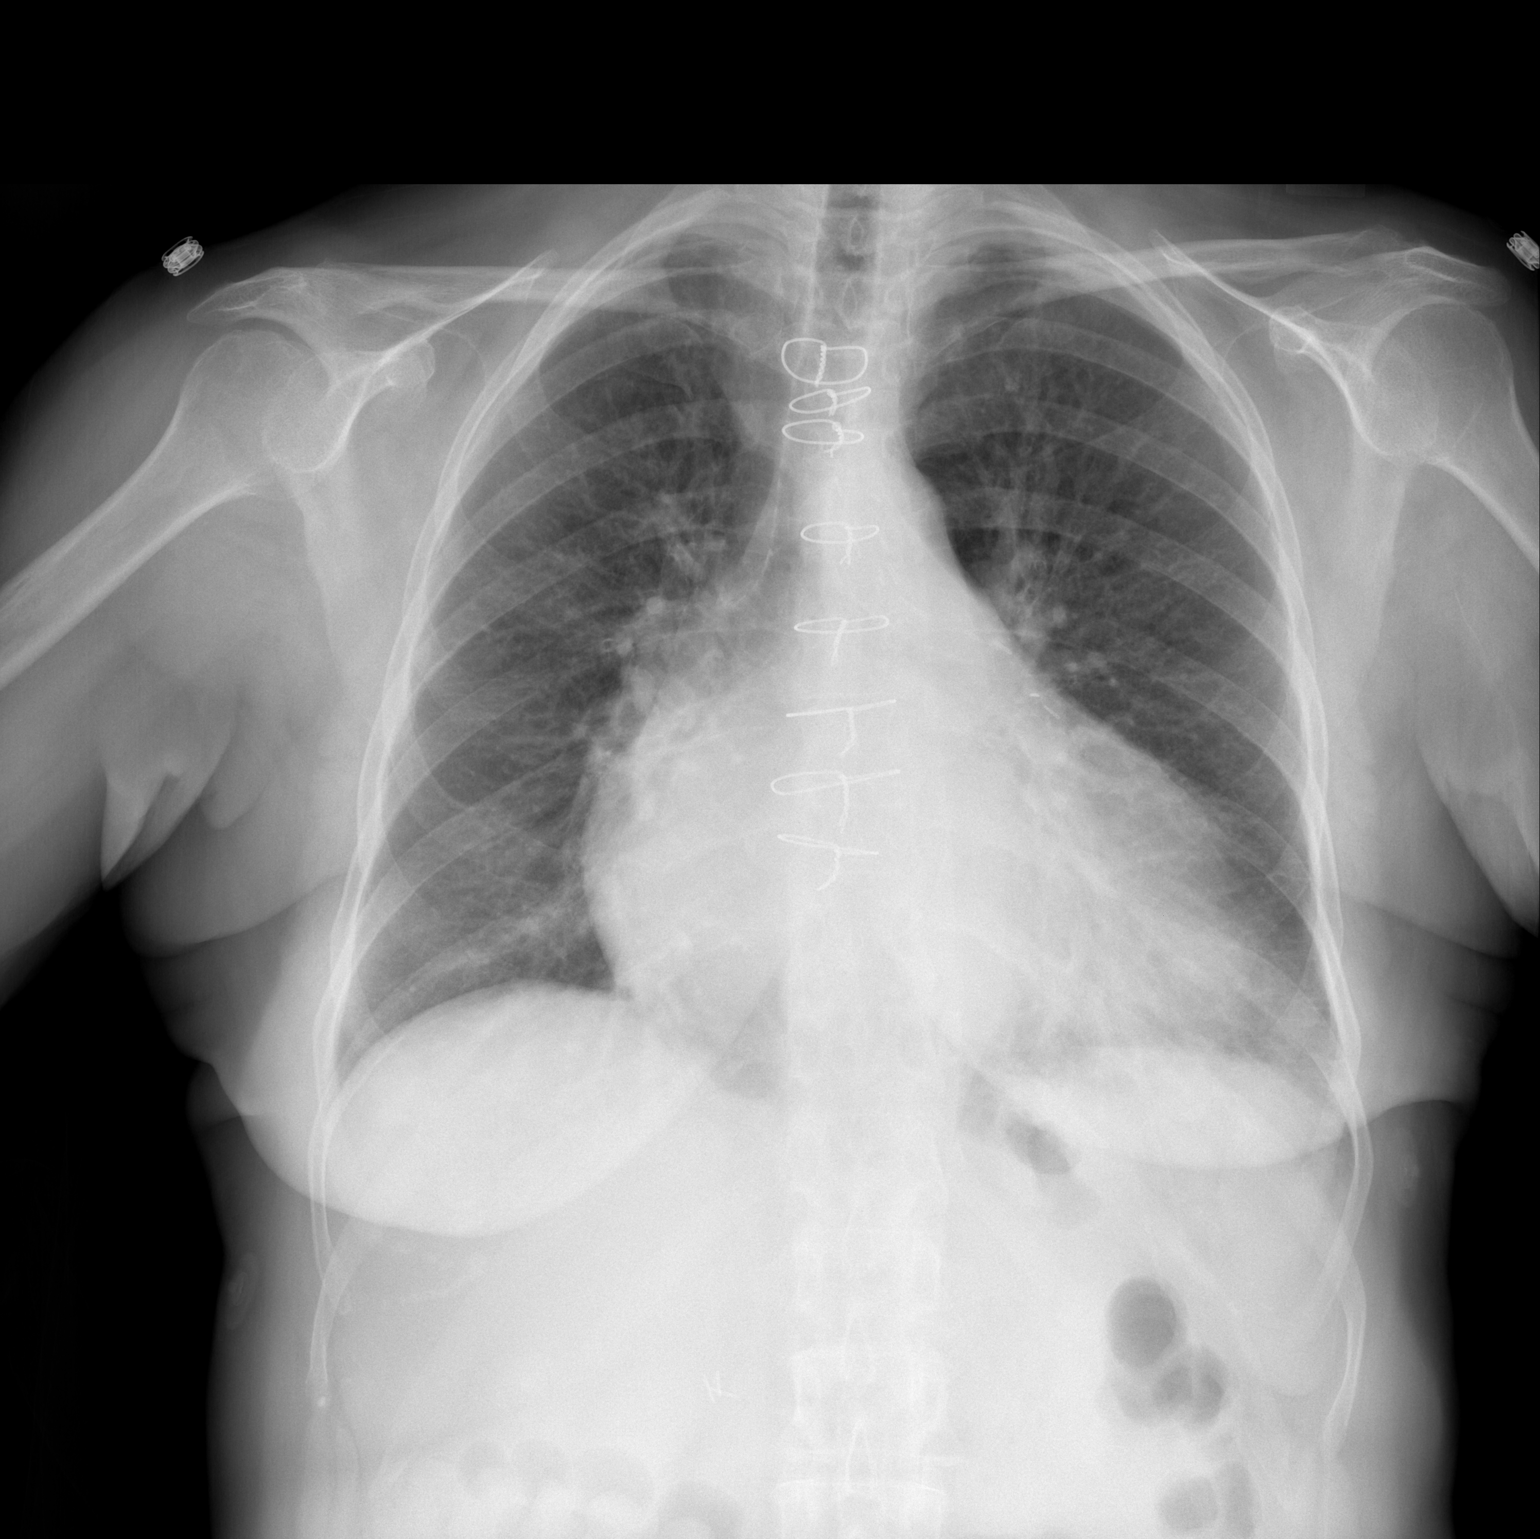

[w chest lat]
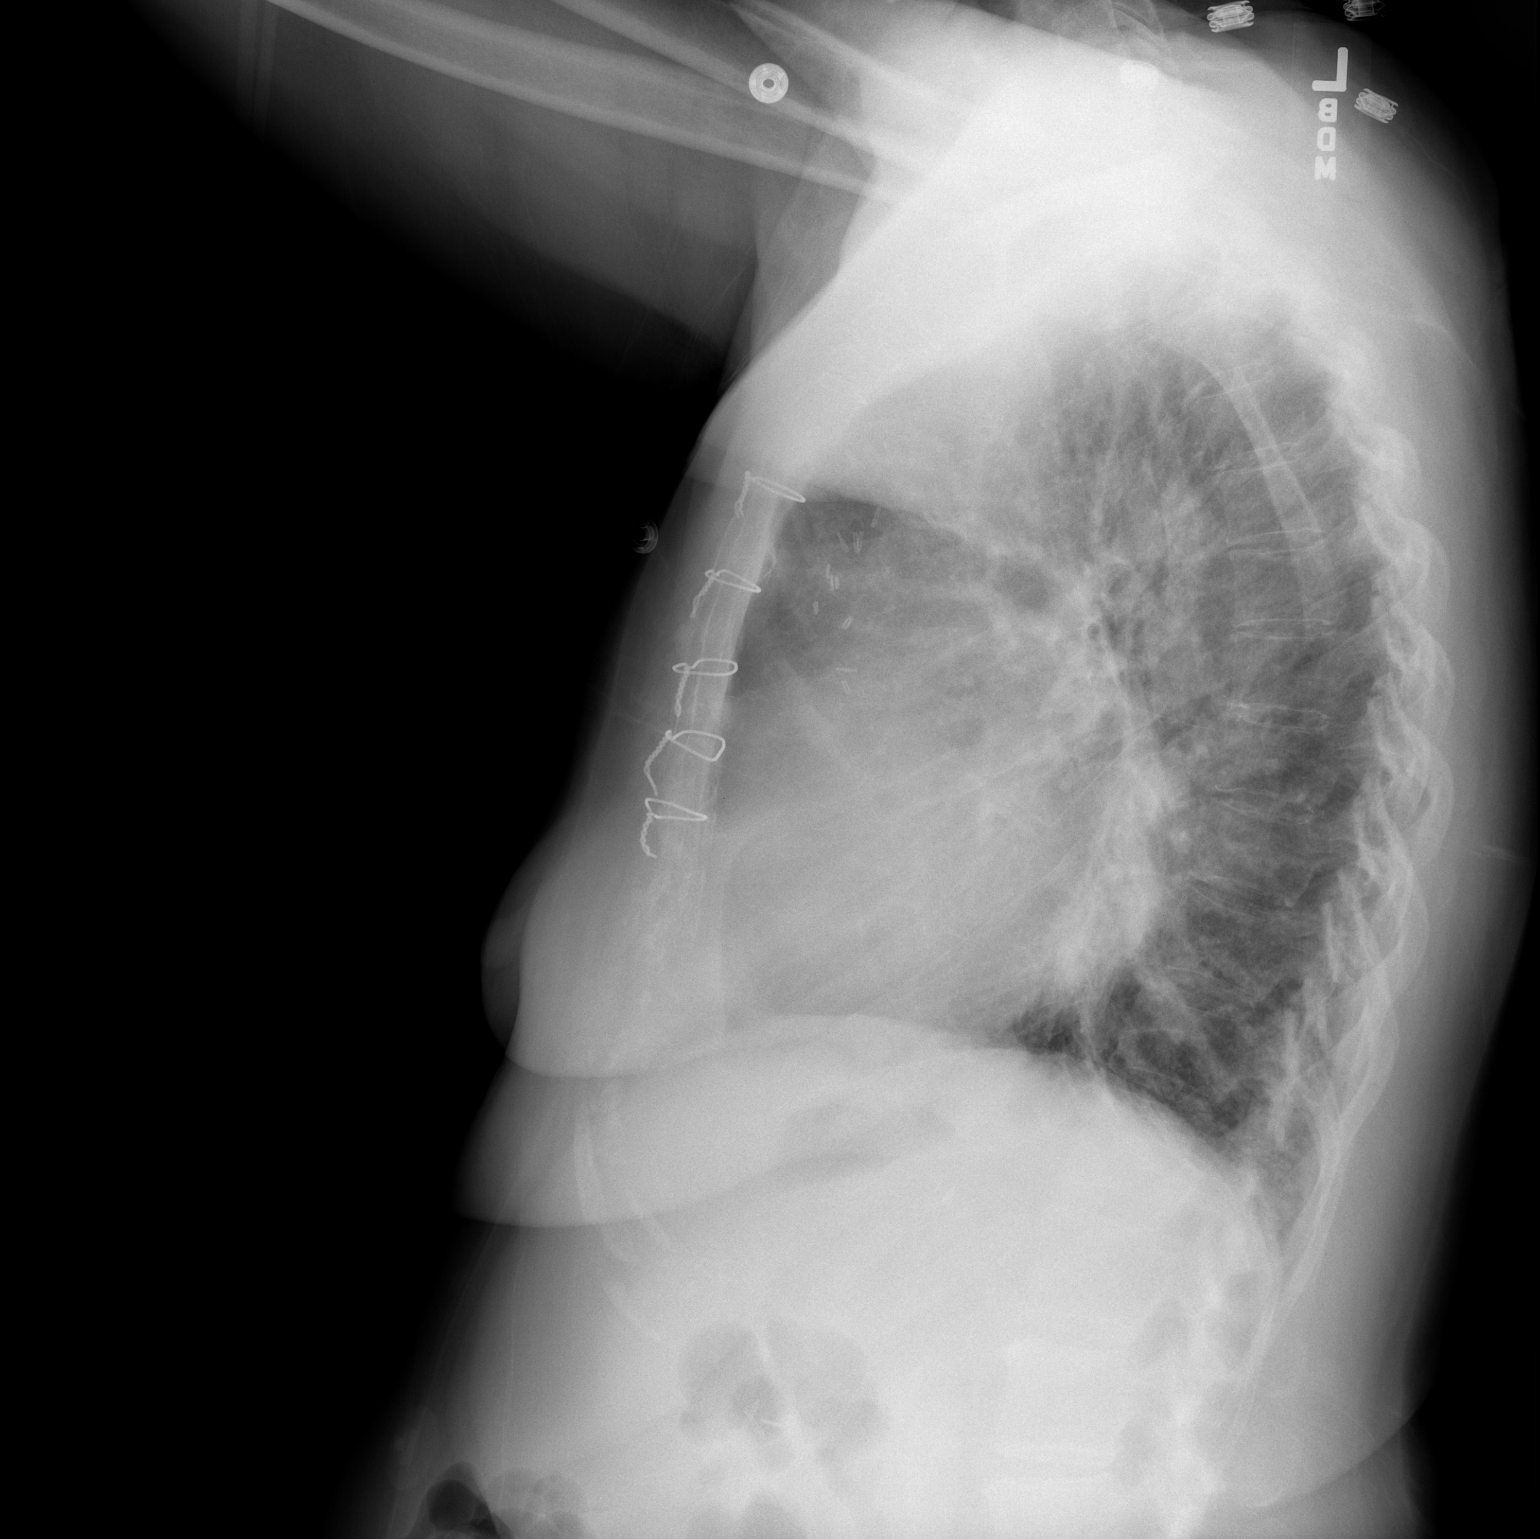

[2 of 2 positions shown; findings below may reference images not displayed]

FINDINGS: There has been some improvement in pulmonary vascular
congestion.  Cardiomegaly remains.  No focal infiltrate or effusion
is seen.  Median sternotomy sutures are noted.
IMPRESSION: Improvement in mild congestion.  Stable cardiomegaly.

## 2011-04-12 ENCOUNTER — Telehealth: Payer: Self-pay | Admitting: Cardiology

## 2011-04-12 NOTE — Telephone Encounter (Signed)
Per pt call, pt needs to find a new PCP, pt PCP is leaving and pt would like to come into the [facility]. Pt would like to know if Dr. Myrtis Ser has any recommendations. Please return pt call to advise/discuss.

## 2011-04-12 NOTE — Telephone Encounter (Signed)
Please figure out which of our offices she lives close to.

## 2011-04-12 NOTE — Telephone Encounter (Signed)
I talked with pt. Pt has been seeing Dr Elisabeth Most and she is leaving. Pt is requesting a recommendation from Dr Myrtis Ser for a PCP. I will forward to Dr Myrtis Ser.

## 2011-04-12 NOTE — Telephone Encounter (Signed)
I talked with pt. Pt states she lives near the [place] office. I have given pt the phone number for [facility] [phone].

## 2011-04-13 ENCOUNTER — Ambulatory Visit (INDEPENDENT_AMBULATORY_CARE_PROVIDER_SITE_OTHER): Payer: BC Managed Care – PPO | Admitting: *Deleted

## 2011-04-13 DIAGNOSIS — I4891 Unspecified atrial fibrillation: Secondary | ICD-10-CM

## 2011-04-13 DIAGNOSIS — Z8679 Personal history of other diseases of the circulatory system: Secondary | ICD-10-CM

## 2011-04-13 LAB — POCT INR: INR: 2

## 2011-05-11 ENCOUNTER — Encounter: Payer: BC Managed Care – PPO | Admitting: *Deleted

## 2011-05-16 ENCOUNTER — Telehealth: Payer: Self-pay | Admitting: Pulmonary Disease

## 2011-05-16 MED ORDER — BECLOMETHASONE DIPROPIONATE 80 MCG/ACT IN AERS: INHALATION_SPRAY | RESPIRATORY_TRACT | Status: DC

## 2011-05-16 NOTE — Telephone Encounter (Signed)
Spoke with pt to verify msg. She states that she is doing well with taking qvar 2 puffs at hs and wants 90 days supply sent to [facility]. Rx was sent to pharm and pt made aware.

## 2011-05-18 ENCOUNTER — Ambulatory Visit (INDEPENDENT_AMBULATORY_CARE_PROVIDER_SITE_OTHER): Payer: BC Managed Care – PPO | Admitting: *Deleted

## 2011-05-18 DIAGNOSIS — I4891 Unspecified atrial fibrillation: Secondary | ICD-10-CM

## 2011-05-18 DIAGNOSIS — Z8679 Personal history of other diseases of the circulatory system: Secondary | ICD-10-CM

## 2011-05-18 LAB — POCT INR: INR: 2.6

## 2011-06-06 ENCOUNTER — Telehealth: Payer: Self-pay | Admitting: Cardiology

## 2011-06-06 NOTE — Telephone Encounter (Signed)
Pt was prescribed tikosyn and already on venlasaxine wanted [name] to be aware if no problems taking together no need to do anything, however if pt should go off venlasaxine please call [facility], the pt and may need to let her dr know, dr Jearld Fenton

## 2011-06-06 NOTE — Telephone Encounter (Signed)
Spoke with pharm md, if pt has been taking both for awhile will be okay if monitor EKG's for long QT. If new script will need to be changed. Left message for pt to call back to discuss Anita Duke

## 2011-06-07 ENCOUNTER — Encounter: Payer: Self-pay | Admitting: Cardiology

## 2011-06-07 NOTE — Telephone Encounter (Signed)
Agree 

## 2011-06-07 NOTE — Telephone Encounter (Signed)
In reviewing pt chart, she has been on Tikosyn and Effexor over 1 1/2 years. She is not having any problems with her medications that she is aware of. [facility] sent her a refill recently. Pt reassured. Mylo Red RN

## 2011-06-07 NOTE — Telephone Encounter (Signed)
Pt returning call from Debra from yesterday. Please return call to discuss further.

## 2011-06-08 ENCOUNTER — Encounter: Payer: Self-pay | Admitting: Cardiology

## 2011-06-15 ENCOUNTER — Ambulatory Visit (INDEPENDENT_AMBULATORY_CARE_PROVIDER_SITE_OTHER): Payer: BC Managed Care – PPO | Admitting: *Deleted

## 2011-06-15 DIAGNOSIS — Z8679 Personal history of other diseases of the circulatory system: Secondary | ICD-10-CM

## 2011-06-15 DIAGNOSIS — I4891 Unspecified atrial fibrillation: Secondary | ICD-10-CM

## 2011-06-22 ENCOUNTER — Other Ambulatory Visit: Payer: Self-pay | Admitting: Dermatology

## 2011-06-27 ENCOUNTER — Encounter: Payer: Self-pay | Admitting: Cardiology

## 2011-06-27 ENCOUNTER — Telehealth: Payer: Self-pay | Admitting: *Deleted

## 2011-06-27 NOTE — Progress Notes (Signed)
The patient needs a new primary care physician.  She had asked if she could see Dr. Clent Ridges.  I called Dr. Clent Ridges to ask if he would accept the patient.  He very nicely agreed to accept her.  We will call the patient and have her call Dr. Claris Che office for an appointment.

## 2011-06-27 NOTE — Telephone Encounter (Signed)
Called patient and advised that Dr Myrtis Ser spoke with Garvin Fila and he is willing to take her as patient.  Gave patient number of the office to call and schedule.  Advised if any problems to call us back

## 2011-07-13 ENCOUNTER — Encounter: Payer: BC Managed Care – PPO | Admitting: *Deleted

## 2011-07-20 ENCOUNTER — Ambulatory Visit (INDEPENDENT_AMBULATORY_CARE_PROVIDER_SITE_OTHER): Payer: BC Managed Care – PPO | Admitting: *Deleted

## 2011-07-20 DIAGNOSIS — I4891 Unspecified atrial fibrillation: Secondary | ICD-10-CM

## 2011-07-20 DIAGNOSIS — Z8679 Personal history of other diseases of the circulatory system: Secondary | ICD-10-CM

## 2011-07-20 DIAGNOSIS — Z7901 Long term (current) use of anticoagulants: Secondary | ICD-10-CM

## 2011-08-17 ENCOUNTER — Ambulatory Visit: Payer: BC Managed Care – PPO | Admitting: Pulmonary Disease

## 2011-08-18 ENCOUNTER — Ambulatory Visit (INDEPENDENT_AMBULATORY_CARE_PROVIDER_SITE_OTHER): Payer: BC Managed Care – PPO | Admitting: *Deleted

## 2011-08-18 ENCOUNTER — Ambulatory Visit: Payer: BC Managed Care – PPO | Admitting: Cardiology

## 2011-08-18 DIAGNOSIS — Z8679 Personal history of other diseases of the circulatory system: Secondary | ICD-10-CM

## 2011-08-18 DIAGNOSIS — I4891 Unspecified atrial fibrillation: Secondary | ICD-10-CM

## 2011-08-18 DIAGNOSIS — Z7901 Long term (current) use of anticoagulants: Secondary | ICD-10-CM

## 2011-08-18 LAB — POCT INR: INR: 2.1

## 2011-08-24 ENCOUNTER — Ambulatory Visit: Payer: BC Managed Care – PPO | Admitting: Pulmonary Disease

## 2011-09-14 ENCOUNTER — Ambulatory Visit (INDEPENDENT_AMBULATORY_CARE_PROVIDER_SITE_OTHER): Payer: BC Managed Care – PPO | Admitting: *Deleted

## 2011-09-14 DIAGNOSIS — Z8679 Personal history of other diseases of the circulatory system: Secondary | ICD-10-CM

## 2011-09-14 DIAGNOSIS — I4891 Unspecified atrial fibrillation: Secondary | ICD-10-CM

## 2011-09-21 ENCOUNTER — Ambulatory Visit (INDEPENDENT_AMBULATORY_CARE_PROVIDER_SITE_OTHER): Payer: BC Managed Care – PPO | Admitting: Pulmonary Disease

## 2011-09-21 ENCOUNTER — Encounter: Payer: Self-pay | Admitting: Pulmonary Disease

## 2011-09-21 DIAGNOSIS — J45909 Unspecified asthma, uncomplicated: Secondary | ICD-10-CM

## 2011-09-21 DIAGNOSIS — R0602 Shortness of breath: Secondary | ICD-10-CM

## 2011-09-21 MED ORDER — BECLOMETHASONE DIPROPIONATE 80 MCG/ACT IN AERS: INHALATION_SPRAY | RESPIRATORY_TRACT | Status: DC

## 2011-09-21 NOTE — Patient Instructions (Signed)
Try decreasing dose of Qvar as tolerated Use proair two puffs as needed for cough, wheeze, or chest congestion Follow up in 6 months

## 2011-09-21 NOTE — Assessment & Plan Note (Signed)
Related to mild asthma, diastolic dysfunction, valvular heart disease, and deconditioning.

## 2011-09-21 NOTE — Assessment & Plan Note (Signed)
She has been doing well.  Will continue to decrease dose of Qvar as tolerated.  She can continue proair as needed.

## 2011-09-21 NOTE — Progress Notes (Signed)
Chief Complaint  Patient presents with  . Follow-up    I spoke with Anita Duke and states she still gets SOB w/ exertion but overall she has been doing fine. Anita Duke denies any cough, wheezing, chest tx    History of Present Illness: Anita Duke is a 71 y.o. female with dyspnea in setting of allergic asthma, diastolic dysfunction, and obesity/deconditioning.  She has been doing well.  She has been using Qvar two puffs at night.  She has not needed to use her proair.  She gets episodes of cough/wheeze maybe once per month, but these don't last long.  She notices more trouble in cold/dry air.  She still gets winded when walking, but quickly recovers when she rests.  Past Medical History  Diagnosis Date  . DM (diabetes mellitus)   . Atrial fibrillation     Treated with maze procedure / recurrent atrial fibrillation February, 2011... TEE cardioversion October 19, 2009  . Atrial tachycardia     February, 2011... Tikosyn started in hospital  . Bradycardia     Post cardioversion February, 2011,/digoxin/Cardizem metoprolol stopped, may need pacemaker  . Mitral regurgitation     Treated with repair, atrial appendage was removed or tied off at surgery.. this was proven by TEE February, 2011  . Pulmonary HTN     55 mmHg, echo, February, 2011 / no mention of pulmonary hypertension echo, June, 2011  . Right ventricular dysfunction     Mild to moderate, echo, February, 2011 / normalized echo, June, 2011  . TR (tricuspid regurgitation)     Moderate, echo, February, 2011 / trivial, echo, June, 2011  . Mitral stenosis     Mild, February, 2011, post mitral valve repair / mild, echo, June, 2011  . CAD (coronary artery disease)     LIMA to the LAD at time of mitral valve repair / LIMA atretic,, February, 2011  . Ejection fraction < 50%     EF 30% echo and TEE diagnosis February, 2011, possibly rate related / no contraction by catheter, bradycardia so carvedilol cannot be used / EF 55-60% echo, June, 2011 / EF  50%, echo, February, 2012  . HTN (hypertension)   . Hemorrhoids, internal   . Diverticulosis of colon   . Colon polyp, hyperplastic   . Environmental allergies   . atrial appendage     Removed were tied off at surgery, proven by TEE February, 2011  . S/P mitral valve repair     [facility] / Maze procedure/ atrial appendage removed were tied off  . Hx of CABG     At time of mitral valve repair  . Rash     ?Vicodin, Lotensin, Toprol  . CHF (congestive heart failure)     Mild in hospital February, 2011  . Drug therapy     Tikosyn  . Shortness of breath     Evaluated by Dr. Craige Cotta. Mat [id number].... bronchodilator response,,  medication started  /  patient much improved June 14, 201 2    Past Surgical History  Procedure Date  . Atrial appendage removed   . Mitral valve repair   . Coronary artery bypass graft   . Maze   . Vaginal hysterectomy   . Cholecystectomy   . Breast lumpectomy     x 2    Allergies  Allergen Reactions  . Meperidine Hcl   . Morphine   . Tetanus Toxoid     Physical Exam:  Blood pressure 132/80, pulse 42, temperature 98.2 F (  36.8 C), temperature source Oral, height 5\' 2"  (1.575 m), weight 187 lb 9.6 oz (85.095 kg), SpO2 96.00%. Body mass index is 34.31 kg/(m^2). Wt Readings from Last 2 Encounters:  09/21/11 187 lb 9.6 oz (85.095 kg)  02/16/11 188 lb 9.6 oz (85.548 kg)    General - no distress  HEENT - PERRLA, EOMI, no sinus tenderness, clear sinus drainage, no oral exudate, no LAN, no thyromegaly  Cardiac - s1s2 with 2/6 systolic murmur, pulses symmetric repeat pulse check by me was 64  Chest - no wheeze/rales/dullness  Abd - soft, nontender, normal bowel sounds  Ext - no e/c/c  Neuro - normal strength, CN intact, A&O x 3  Psych - normal mood/behavior  Skin - no rashes  Assessment/Plan:  Outpatient Encounter Prescriptions as of 09/21/2011  Medication Sig Dispense Refill  . albuterol (PROAIR HFA) 108 (90 BASE) MCG/ACT inhaler Two puffs up to  four times per day as needed  1 Inhaler  6  . aspirin 81 MG tablet Take 81 mg by mouth daily.        Marland Kitchen atorvastatin (LIPITOR) 40 MG tablet Take 40 mg by mouth daily.        . beclomethasone (QVAR) 80 MCG/ACT inhaler 2 puffs at bedtime  3 Inhaler  2  . calcium carbonate (OS-CAL) 600 MG TABS Take 600 mg by mouth daily.       . Cholecalciferol (VITAMIN D3) 1000 UNITS CAPS Take 4 capsules by mouth daily.        Marland Kitchen dofetilide (TIKOSYN) 500 MCG capsule Take 500 mcg by mouth 2 (two) times daily.        . fish oil-omega-3 fatty acids 1000 MG capsule Take 2 g by mouth 2 (two) times daily.        . furosemide (LASIX) 40 MG tablet Take 40 mg by mouth daily.        . metFORMIN (GLUCOPHAGE) 500 MG tablet Take 500 mg by mouth 2 (two) times daily with a meal.      . trandolapril (MAVIK) 4 MG tablet Take 8 mg by mouth daily.        Marland Kitchen venlafaxine (EFFEXOR) 37.5 MG tablet Take 37.5 mg by mouth daily.        Marland Kitchen warfarin (COUMADIN) 5 MG tablet Take 1 tablet (5 mg total) by mouth as directed.  30 tablet  1  . DISCONTD: beclomethasone (QVAR) 80 MCG/ACT inhaler One puff in morning and two puffs at night for two weeks.  If okay, then two puffs at night.  1 Inhaler  6    Pager:  [phone]

## 2011-10-12 ENCOUNTER — Encounter: Payer: BC Managed Care – PPO | Admitting: *Deleted

## 2011-10-13 ENCOUNTER — Ambulatory Visit (INDEPENDENT_AMBULATORY_CARE_PROVIDER_SITE_OTHER): Payer: BC Managed Care – PPO | Admitting: Cardiology

## 2011-10-13 ENCOUNTER — Encounter: Payer: Self-pay | Admitting: Cardiology

## 2011-10-13 ENCOUNTER — Ambulatory Visit (INDEPENDENT_AMBULATORY_CARE_PROVIDER_SITE_OTHER): Payer: BC Managed Care – PPO | Admitting: *Deleted

## 2011-10-13 VITALS — BP 138/72 | HR 85 | Ht 62.0 in | Wt 183.0 lb

## 2011-10-13 DIAGNOSIS — I071 Rheumatic tricuspid insufficiency: Secondary | ICD-10-CM

## 2011-10-13 DIAGNOSIS — R9431 Abnormal electrocardiogram [ECG] [EKG]: Secondary | ICD-10-CM

## 2011-10-13 DIAGNOSIS — Z8679 Personal history of other diseases of the circulatory system: Secondary | ICD-10-CM

## 2011-10-13 DIAGNOSIS — R0602 Shortness of breath: Secondary | ICD-10-CM

## 2011-10-13 DIAGNOSIS — I050 Rheumatic mitral stenosis: Secondary | ICD-10-CM

## 2011-10-13 DIAGNOSIS — I4891 Unspecified atrial fibrillation: Secondary | ICD-10-CM

## 2011-10-13 DIAGNOSIS — R0989 Other specified symptoms and signs involving the circulatory and respiratory systems: Secondary | ICD-10-CM

## 2011-10-13 DIAGNOSIS — R9430 Abnormal result of cardiovascular function study, unspecified: Secondary | ICD-10-CM

## 2011-10-13 DIAGNOSIS — I519 Heart disease, unspecified: Secondary | ICD-10-CM

## 2011-10-13 DIAGNOSIS — Z79899 Other long term (current) drug therapy: Secondary | ICD-10-CM

## 2011-10-13 DIAGNOSIS — I2789 Other specified pulmonary heart diseases: Secondary | ICD-10-CM

## 2011-10-13 DIAGNOSIS — IMO0002 Reserved for concepts with insufficient information to code with codable children: Secondary | ICD-10-CM

## 2011-10-13 DIAGNOSIS — I340 Nonrheumatic mitral (valve) insufficiency: Secondary | ICD-10-CM

## 2011-10-13 DIAGNOSIS — I272 Pulmonary hypertension, unspecified: Secondary | ICD-10-CM

## 2011-10-13 DIAGNOSIS — I059 Rheumatic mitral valve disease, unspecified: Secondary | ICD-10-CM

## 2011-10-13 DIAGNOSIS — Z5189 Encounter for other specified aftercare: Secondary | ICD-10-CM

## 2011-10-13 DIAGNOSIS — Z7901 Long term (current) use of anticoagulants: Secondary | ICD-10-CM

## 2011-10-13 DIAGNOSIS — I079 Rheumatic tricuspid valve disease, unspecified: Secondary | ICD-10-CM

## 2011-10-13 MED ORDER — DOFETILIDE 250 MCG PO CAPS: 250.0000 ug | ORAL_CAPSULE | Freq: Two times a day (BID) | ORAL | Status: DC

## 2011-10-13 NOTE — Assessment & Plan Note (Signed)
Currently shortness of breath is under control. She is receiving pulmonary treatment.

## 2011-10-13 NOTE — Progress Notes (Signed)
HPI Patient is seen today to followup mitral valve disease and atrial tachycardia. She's also seen to follow up her shortness of breath. I saw her last June, 2012. Prior that she had been having some shortness of breath. Her echo in February, 2012 had revealed an ejection fraction of 50-55%. I was pleased that her EF had normalized. Originally we felt that she had decreased LV function probably from her atrial tachycardia. She has been on Tikosyn. Up to now her QT interval has been reasonable. She is on Effexor, that can affect the QT interval. I have been aware this. She's not having any chest pain. There is been no palpitations. There's been no syncope or presyncope. Allergies  Allergen Reactions  . Meperidine Hcl   . Morphine   . Tetanus Toxoid     Current Outpatient Prescriptions  Medication Sig Dispense Refill  . albuterol (PROAIR HFA) 108 (90 BASE) MCG/ACT inhaler Two puffs up to four times per day as needed  1 Inhaler  6  . aspirin 81 MG tablet Take 81 mg by mouth daily.        Marland Kitchen atorvastatin (LIPITOR) 40 MG tablet Take 40 mg by mouth daily.        . beclomethasone (QVAR) 80 MCG/ACT inhaler One puff at night for two weeks, and if okay then stop Qvar  1 Inhaler  2  . calcium carbonate (OS-CAL) 600 MG TABS Take 600 mg by mouth daily.       . Cholecalciferol (VITAMIN D3) 1000 UNITS CAPS Take 4 capsules by mouth daily.        Marland Kitchen dofetilide (TIKOSYN) 500 MCG capsule Take 500 mcg by mouth 2 (two) times daily.        . fish oil-omega-3 fatty acids 1000 MG capsule Take 2 g by mouth 2 (two) times daily.        . furosemide (LASIX) 40 MG tablet Take 40 mg by mouth daily.        . trandolapril (MAVIK) 4 MG tablet Take 8 mg by mouth daily.        Marland Kitchen venlafaxine (EFFEXOR) 37.5 MG tablet Take 37.5 mg by mouth daily.        Marland Kitchen warfarin (COUMADIN) 5 MG tablet Take 1 tablet (5 mg total) by mouth as directed.  30 tablet  1  . metFORMIN (GLUCOPHAGE) 500 MG tablet Take 500 mg by mouth 2 (two) times daily with  a meal.        History   Social History  . Marital Status: Married    Spouse Name: N/A    Number of Children: N/A  . 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     Social History Main Topics  . Smoking status: Never Smoker   . Smokeless tobacco: Never Used  . Alcohol Use: No  . Drug Use: No  . Sexually Active: Not on file   Other Topics Concern  . Not on file   Social History Narrative   Does not et regular exerciseDaily caffeine use    Family History  Problem Relation Age of Onset  . Cancer Maternal Grandfather   . Heart disease Father   . Heart disease Sister   . Diabetes Mother   . Diabetes Father   . Diabetes Sister   . Diabetes Paternal Grandfather   . Diabetes Paternal Grandmother   . Heart disease Paternal Uncle     Past  Medical History  Diagnosis Date  . DM (diabetes mellitus)   . Atrial fibrillation     Treated with maze procedure / recurrent atrial fibrillation February, 2011... TEE cardioversion October 19, 2009  . Atrial tachycardia     February, 2011... Tikosyn started in hospital  . Bradycardia     Post cardioversion February, 2011,/digoxin/Cardizem metoprolol stopped, may need pacemaker  . Mitral regurgitation     Treated with repair, atrial appendage was removed or tied off at surgery.. this was proven by TEE February, 2011  . Pulmonary HTN     55 mmHg, echo, February, 2011 / no mention of pulmonary hypertension echo, June, 2011  . Right ventricular dysfunction     Mild to moderate, echo, February, 2011 / normalized echo, June, 2011  . TR (tricuspid regurgitation)     Moderate, echo, February, 2011 / trivial, echo, June, 2011  . Mitral stenosis     Mild, February, 2011, post mitral valve repair / mild, echo, June, 2011  . CAD (coronary artery disease)     LIMA to the LAD at time of mitral valve repair / LIMA atretic,, February, 2011  . Ejection fraction < 50%     EF 30% echo and TEE diagnosis  February, 2011, possibly rate related / no contraction by catheter, bradycardia so carvedilol cannot be used / EF 55-60% echo, June, 2011 / EF 50%, echo, February, 2012  . HTN (hypertension)   . Hemorrhoids, internal   . Diverticulosis of colon   . Colon polyp, hyperplastic   . Environmental allergies   . atrial appendage     Removed were tied off at surgery, proven by TEE February, 2011  . S/P mitral valve repair     [facility] / Maze procedure/ atrial appendage removed were tied off  . Hx of CABG     At time of mitral valve repair  . Rash     ?Vicodin, Lotensin, Toprol  . CHF (congestive heart failure)     Mild in hospital February, 2011  . Drug therapy     Tikosyn  . Shortness of breath     Evaluated by Dr. Craige Cotta. Mat [id number].... bronchodilator response,,  medication started  /  patient much improved June 14, 201 2    Past Surgical History  Procedure Date  . Atrial appendage removed   . Mitral valve repair   . Coronary artery bypass graft   . Maze   . Vaginal hysterectomy   . Cholecystectomy   . Breast lumpectomy     x 2    ROS  Patient denies fever, chills, headache, sweats, rash, change in vision, change in hearing, chest pain, cough, nausea vomiting, urinary symptoms. All other systems are reviewed and are negative.  PHYSICAL EXAM Patient is oriented to person time and place. Affect is normal. She's here with her husband.There is no jugulovenous distention. Lungs are clear. Respiratory effort is nonlabored. Cardiac exam reveals S1 and S2. She does have premature beats. The abdomen is soft. There is no peripheral edema. There are no musculoskeletal deformities. There are no skin rashes. Filed Vitals:   10/13/11 0948  BP: 138/72  Pulse: 85  Height: 5\' 2"  (1.575 m)  Weight: 183 lb (83.008 kg)    EKG is done today and reviewed by me. She has sinus rhythm. There are multiple PACs. I have reviewed the EKG personally.  ASSESSMENT & PLAN

## 2011-10-13 NOTE — Assessment & Plan Note (Signed)
We know that she has mild functional mitral stenosis after her repair. No further workup.

## 2011-10-13 NOTE — Assessment & Plan Note (Signed)
The patient's last echo in February 2 012 revealed ejection fraction of 55%. I will consider a follow up echo in the near future.

## 2011-10-13 NOTE — Assessment & Plan Note (Signed)
The patient is stable after her mitral valve repair in the past. She had a TEE in 2011 that was stable. Her left atrial appendage was either removed or tied off at the time of surgery. Despite this we have continued her on Coumadin.

## 2011-10-13 NOTE — Patient Instructions (Signed)
Your physician has recommended you make the following change in your medication: decrease your Tikosyn dose to 250 mg twice a day.  Your physician recommends that you schedule a follow-up appointment in: 2 weeks - nurse appt for an ekg  Your physician recommends that you schedule a follow-up appointment in: March

## 2011-10-13 NOTE — Assessment & Plan Note (Signed)
The patient is on Tikosyn. We believe this is helping to keep her in sinus rhythm. I have been following her QT intervals. Today her corrected QT interval is prolonged. I will decrease her Tikosyn dose to 250 mg twice a day. I am hopeful that she will maintain sinus rhythm. I have considered using 375 mg daily but this involves 2 different pills. I will see her for early followup to be sure that her rhythm has remained stable.  I have spent an extensive amount of time reviewing the records to see if we had ever tried a lower dose. I cannot document this.

## 2011-10-16 ENCOUNTER — Telehealth: Payer: Self-pay | Admitting: Cardiology

## 2011-10-16 NOTE — Telephone Encounter (Signed)
New msg [facility] has some questions about tikosyn

## 2011-10-17 NOTE — Telephone Encounter (Signed)
Pharmacist called to clarify dose change of Tikosyn.

## 2011-10-26 ENCOUNTER — Ambulatory Visit (INDEPENDENT_AMBULATORY_CARE_PROVIDER_SITE_OTHER): Payer: BC Managed Care – PPO | Admitting: *Deleted

## 2011-10-26 DIAGNOSIS — R9431 Abnormal electrocardiogram [ECG] [EKG]: Secondary | ICD-10-CM

## 2011-10-26 NOTE — Telephone Encounter (Signed)
Fu call °Patient returning your call °

## 2011-10-26 NOTE — Telephone Encounter (Signed)
I called Anita Duke to find out how she is doing on the new dose of tikosyn.  She states she is feeling well, about the same as she was on the previous dose except for being slightly more tired recently.  I let her know that Dr Myrtis Ser is planning to leave her on the new dose as long as she is feeling ok.  She is in agreement with this.  I also let her know that her QT interval looked better.

## 2011-11-09 ENCOUNTER — Ambulatory Visit (INDEPENDENT_AMBULATORY_CARE_PROVIDER_SITE_OTHER): Payer: BC Managed Care – PPO | Admitting: Cardiology

## 2011-11-09 ENCOUNTER — Other Ambulatory Visit: Payer: Self-pay

## 2011-11-09 ENCOUNTER — Encounter: Payer: Self-pay | Admitting: Cardiology

## 2011-11-09 DIAGNOSIS — R9430 Abnormal result of cardiovascular function study, unspecified: Secondary | ICD-10-CM

## 2011-11-09 DIAGNOSIS — R9431 Abnormal electrocardiogram [ECG] [EKG]: Secondary | ICD-10-CM

## 2011-11-09 DIAGNOSIS — R0989 Other specified symptoms and signs involving the circulatory and respiratory systems: Secondary | ICD-10-CM

## 2011-11-09 DIAGNOSIS — Z7901 Long term (current) use of anticoagulants: Secondary | ICD-10-CM

## 2011-11-09 DIAGNOSIS — I2510 Atherosclerotic heart disease of native coronary artery without angina pectoris: Secondary | ICD-10-CM

## 2011-11-09 DIAGNOSIS — I41 Myocarditis in diseases classified elsewhere: Secondary | ICD-10-CM

## 2011-11-09 DIAGNOSIS — Z9889 Other specified postprocedural states: Secondary | ICD-10-CM

## 2011-11-09 DIAGNOSIS — Z5189 Encounter for other specified aftercare: Secondary | ICD-10-CM

## 2011-11-09 DIAGNOSIS — R0602 Shortness of breath: Secondary | ICD-10-CM

## 2011-11-09 DIAGNOSIS — Z79899 Other long term (current) drug therapy: Secondary | ICD-10-CM

## 2011-11-09 DIAGNOSIS — I059 Rheumatic mitral valve disease, unspecified: Secondary | ICD-10-CM

## 2011-11-09 NOTE — Assessment & Plan Note (Signed)
Patient continues to be stable on Coumadin.

## 2011-11-09 NOTE — Assessment & Plan Note (Signed)
QT interval today is 500 ms. This is acceptable for her.

## 2011-11-09 NOTE — Assessment & Plan Note (Signed)
Patient is doing well on 250 mcg of Tikosyn twice a day. This dose will be continued

## 2011-11-09 NOTE — Assessment & Plan Note (Signed)
Patient is doing extremely well. The echo will give [name] more information about her valve function and I for stenosis after valve repair.

## 2011-11-09 NOTE — Progress Notes (Signed)
HPI Patient is seen back for followup mitral valve disease and supraventricular tachycardia. She is a complex patient but she is actually doing well. When I saw her last on October 13, 2011, I lowered the dose of her Tikosyn because of Prolonged QT interval. I was very concerned that she might have return of her supraventricular arrhythmia. She had had this with no symptoms but developed a rate related cardiomyopathy. Fortunately LV function improved when we were able to return her to sinus rhythm. Fortunately also she has remained in sinus rhythm on a lower dose of 250 mcg of Tikosyn twice a day. She is now here for followup. She had some problems with shortness of breath but this turned out to be mostly related to pulmonary disease. However he would have to keep in mind that she has some functional mitral stenosis after her mitral valve repair.  Allergies  Allergen Reactions  . Meperidine Hcl   . Morphine   . Tetanus Toxoid     Current Outpatient Prescriptions  Medication Sig Dispense Refill  . albuterol (PROAIR HFA) 108 (90 BASE) MCG/ACT inhaler Two puffs up to four times per day as needed  1 Inhaler  6  . aspirin 81 MG tablet Take 81 mg by mouth daily.        Marland Kitchen atorvastatin (LIPITOR) 40 MG tablet Take 40 mg by mouth daily.        . beclomethasone (QVAR) 80 MCG/ACT inhaler One puff at night for two weeks, and if okay then stop Qvar  1 Inhaler  2  . calcium carbonate (OS-CAL) 600 MG TABS Take 600 mg by mouth daily.       . Cholecalciferol (VITAMIN D3) 1000 UNITS CAPS Take 4 capsules by mouth daily.        Marland Kitchen dofetilide (TIKOSYN) 250 MCG capsule Take 1 capsule (250 mcg total) by mouth 2 (two) times daily.  28 capsule  1  . fish oil-omega-3 fatty acids 1000 MG capsule Take 2 g by mouth 2 (two) times daily.        . furosemide (LASIX) 40 MG tablet Take 40 mg by mouth daily.        . metFORMIN (GLUCOPHAGE) 500 MG tablet Take 500 mg by mouth 2 (two) times daily with a meal.      . trandolapril  (MAVIK) 4 MG tablet Take 8 mg by mouth daily.        Marland Kitchen venlafaxine (EFFEXOR) 37.5 MG tablet Take 37.5 mg by mouth daily.        Marland Kitchen warfarin (COUMADIN) 5 MG tablet Take 1 tablet (5 mg total) by mouth as directed.  30 tablet  1    History   Social History  . Marital Status: Married    Spouse Name: N/A    Number of Children: N/A  . 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     Social History Main Topics  . Smoking status: Never Smoker   . Smokeless tobacco: Never Used  . Alcohol Use: No  . Drug Use: No  . Sexually Active: Not on file   Other Topics Concern  . Not on file   Social History Narrative   Does not et regular exerciseDaily caffeine use    Family History  Problem Relation Age of Onset  . Cancer Maternal Grandfather   . Heart disease Father   . Heart disease Sister   . Diabetes  Mother   . Diabetes Father   . Diabetes Sister   . Diabetes Paternal Grandfather   . Diabetes Paternal Grandmother   . Heart disease Paternal Uncle     Past Medical History  Diagnosis Date  . DM (diabetes mellitus)   . Atrial fibrillation     Treated with maze procedure / recurrent atrial fibrillation February, 2011... TEE cardioversion October 19, 2009  . Atrial tachycardia     February, 2011... Tikosyn started in hospital  . Bradycardia     Post cardioversion February, 2011,/digoxin/Cardizem metoprolol stopped, may need pacemaker  . Mitral regurgitation     Treated with repair, atrial appendage was removed or tied off at surgery.. this was proven by TEE February, 2011  . Pulmonary HTN     55 mmHg, echo, February, 2011 / no mention of pulmonary hypertension echo, June, 2011  . Right ventricular dysfunction     Mild to moderate, echo, February, 2011 / normalized echo, June, 2011  . TR (tricuspid regurgitation)     Moderate, echo, February, 2011 / trivial, echo, June, 2011  . Mitral stenosis     Mild, February, 2011, post mitral valve  repair / mild, echo, June, 2011  . CAD (coronary artery disease)     LIMA to the LAD at time of mitral valve repair / LIMA atretic,, February, 2011  . Ejection fraction < 50%     EF 30% echo and TEE diagnosis February, 2011, possibly rate related / no contraction by catheter, bradycardia so carvedilol cannot be used / EF 55-60% echo, June, 2011 / EF 50%, echo, February, 2012  . HTN (hypertension)   . Hemorrhoids, internal   . Diverticulosis of colon   . Colon polyp, hyperplastic   . Environmental allergies   . atrial appendage     Removed were tied off at surgery, proven by TEE February, 2011  . S/P mitral valve repair     [facility] / Maze procedure/ atrial appendage removed were tied off  . Hx of CABG     At time of mitral valve repair  . Rash     ?Vicodin, Lotensin, Toprol  . CHF (congestive heart failure)     Mild in hospital February, 2011  . Drug therapy     Tikosyn  . Shortness of breath     Evaluated by Dr. Craige Cotta. Mat [id number].... bronchodilator response,,  medication started  /  patient much improved June 14, 201 2  . QT prolongation     Tikosyn and Effexor. QT prolonged October 13, 2011, peak is in dose reduced from 500  to -250 twice a day    Past Surgical History  Procedure Date  . Atrial appendage removed   . Mitral valve repair   . Coronary artery bypass graft   . Maze   . Vaginal hysterectomy   . Cholecystectomy   . Breast lumpectomy     x 2    ROS   Patient denies fever, chills, headache, sweats, rash, change in vision, change in hearing, chest pain, cough, nausea vomiting, urinary symptoms. All other systems are reviewed and are negative.  PHYSICAL EXAM  Patient's oriented to person time and place. Affect is normal. There is no jugulovenous distention. Lungs are clear. Respiratory effort is nonlabored. Cardiac exam reveals S1 and S2. There are no significant murmurs heard. The abdomen is soft. She is overweight. There is no peripheral edema. There no  musculoskeletal deformities. There are no skin rashes.  Filed Vitals:  11/09/11 1643  BP: 128/68  Pulse: 80  Height: 5\' 3"  (1.6 m)  Weight: 184 lb (83.462 kg)   EKG is done today and reviewed by me. She has had a Maze procedure. Her P wave amplitude is very small area however I can see P waves in lead 3 and in the rhythm strip lead 2. There are PACs. This is a stable rhythm for her.  ASSESSMENT & PLAN

## 2011-11-09 NOTE — Patient Instructions (Signed)

## 2011-11-09 NOTE — Assessment & Plan Note (Signed)
The patient's ejection fraction fortunately improved when we were able to get her back in sinus rhythm. Her last echo was February, 2012, with an ejection fraction of 55%. She now needs a followup echo to reassess LV function and also to reassess the mitral stenosis from mitral valve repair

## 2011-11-09 NOTE — Assessment & Plan Note (Signed)
Patient continues to be stable. Her pulmonary medicines continue to help significantly.

## 2011-11-10 ENCOUNTER — Encounter: Payer: Self-pay | Admitting: Cardiology

## 2011-11-14 ENCOUNTER — Other Ambulatory Visit: Payer: Self-pay | Admitting: Cardiology

## 2011-11-23 ENCOUNTER — Encounter: Payer: BC Managed Care – PPO | Admitting: *Deleted

## 2011-11-24 ENCOUNTER — Encounter: Payer: BC Managed Care – PPO | Admitting: *Deleted

## 2011-11-30 ENCOUNTER — Other Ambulatory Visit: Payer: BC Managed Care – PPO

## 2011-11-30 ENCOUNTER — Other Ambulatory Visit (INDEPENDENT_AMBULATORY_CARE_PROVIDER_SITE_OTHER): Payer: BC Managed Care – PPO

## 2011-11-30 ENCOUNTER — Other Ambulatory Visit: Payer: Self-pay

## 2011-11-30 ENCOUNTER — Other Ambulatory Visit: Payer: Self-pay | Admitting: Cardiology

## 2011-11-30 ENCOUNTER — Ambulatory Visit (INDEPENDENT_AMBULATORY_CARE_PROVIDER_SITE_OTHER): Payer: BC Managed Care – PPO | Admitting: *Deleted

## 2011-11-30 ENCOUNTER — Ambulatory Visit (HOSPITAL_COMMUNITY): Payer: BC Managed Care – PPO | Attending: Cardiology

## 2011-11-30 DIAGNOSIS — R0609 Other forms of dyspnea: Secondary | ICD-10-CM | POA: Insufficient documentation

## 2011-11-30 DIAGNOSIS — I4891 Unspecified atrial fibrillation: Secondary | ICD-10-CM

## 2011-11-30 DIAGNOSIS — I519 Heart disease, unspecified: Secondary | ICD-10-CM | POA: Insufficient documentation

## 2011-11-30 DIAGNOSIS — E669 Obesity, unspecified: Secondary | ICD-10-CM | POA: Insufficient documentation

## 2011-11-30 DIAGNOSIS — Z951 Presence of aortocoronary bypass graft: Secondary | ICD-10-CM | POA: Insufficient documentation

## 2011-11-30 DIAGNOSIS — I2510 Atherosclerotic heart disease of native coronary artery without angina pectoris: Secondary | ICD-10-CM

## 2011-11-30 DIAGNOSIS — I514 Myocarditis, unspecified: Secondary | ICD-10-CM | POA: Insufficient documentation

## 2011-11-30 DIAGNOSIS — E119 Type 2 diabetes mellitus without complications: Secondary | ICD-10-CM | POA: Insufficient documentation

## 2011-11-30 DIAGNOSIS — I498 Other specified cardiac arrhythmias: Secondary | ICD-10-CM | POA: Insufficient documentation

## 2011-11-30 DIAGNOSIS — R0989 Other specified symptoms and signs involving the circulatory and respiratory systems: Secondary | ICD-10-CM | POA: Insufficient documentation

## 2011-11-30 DIAGNOSIS — Z8679 Personal history of other diseases of the circulatory system: Secondary | ICD-10-CM

## 2011-11-30 DIAGNOSIS — I10 Essential (primary) hypertension: Secondary | ICD-10-CM | POA: Insufficient documentation

## 2011-11-30 DIAGNOSIS — I41 Myocarditis in diseases classified elsewhere: Secondary | ICD-10-CM

## 2011-11-30 DIAGNOSIS — Z7901 Long term (current) use of anticoagulants: Secondary | ICD-10-CM

## 2011-11-30 DIAGNOSIS — I2789 Other specified pulmonary heart diseases: Secondary | ICD-10-CM | POA: Insufficient documentation

## 2011-11-30 DIAGNOSIS — I059 Rheumatic mitral valve disease, unspecified: Secondary | ICD-10-CM | POA: Insufficient documentation

## 2011-11-30 LAB — BASIC METABOLIC PANEL
Chloride: 103 mEq/L (ref 96–112)
Creatinine, Ser: 0.5 mg/dL (ref 0.4–1.2)
GFR: 131.99 mL/min (ref >60.00)
Potassium: 3.3 mEq/L — ABNORMAL LOW (ref 3.5–5.1)

## 2011-11-30 LAB — POCT INR: INR: 2.8

## 2011-12-05 ENCOUNTER — Other Ambulatory Visit: Payer: Self-pay | Admitting: *Deleted

## 2011-12-05 MED ORDER — POTASSIUM CHLORIDE CRYS ER 20 MEQ PO TBCR: 20.0000 meq | EXTENDED_RELEASE_TABLET | Freq: Every day | ORAL | Status: DC

## 2011-12-11 ENCOUNTER — Telehealth: Payer: Self-pay | Admitting: Cardiology

## 2011-12-11 NOTE — Telephone Encounter (Signed)
Fu call °Pt returning your call  °

## 2011-12-11 NOTE — Telephone Encounter (Signed)
Pt was given the results of her echo per Dr Myrtis Ser.

## 2011-12-21 ENCOUNTER — Ambulatory Visit (INDEPENDENT_AMBULATORY_CARE_PROVIDER_SITE_OTHER): Payer: BC Managed Care – PPO | Admitting: *Deleted

## 2011-12-21 DIAGNOSIS — I10 Essential (primary) hypertension: Secondary | ICD-10-CM

## 2011-12-21 LAB — BASIC METABOLIC PANEL
CO2: 27 mEq/L (ref 19–32)
GFR: 90.41 mL/min (ref >60.00)
Glucose, Bld: 194 mg/dL — ABNORMAL HIGH (ref 70–99)
Potassium: 3.1 mEq/L — ABNORMAL LOW (ref 3.5–5.1)
Sodium: 140 mEq/L (ref 135–145)

## 2011-12-25 ENCOUNTER — Telehealth: Payer: Self-pay | Admitting: Cardiology

## 2011-12-25 MED ORDER — POTASSIUM CHLORIDE CRYS ER 20 MEQ PO TBCR: 40.0000 meq | EXTENDED_RELEASE_TABLET | Freq: Every day | ORAL | Status: DC

## 2011-12-25 NOTE — Telephone Encounter (Signed)
New Problem:     Patient was called by Anita Duke and told to double up on her potassium chloride SA (K-DUR,KLOR-CON) 20 MEQ tablet and needs our office to send in a refill before they will fill her prescription.  Please call back if you have any questions.

## 2011-12-28 ENCOUNTER — Ambulatory Visit (INDEPENDENT_AMBULATORY_CARE_PROVIDER_SITE_OTHER): Payer: BC Managed Care – PPO | Admitting: *Deleted

## 2011-12-28 ENCOUNTER — Telehealth: Payer: Self-pay | Admitting: Cardiology

## 2011-12-28 DIAGNOSIS — I10 Essential (primary) hypertension: Secondary | ICD-10-CM

## 2011-12-28 LAB — BASIC METABOLIC PANEL
BUN: 14 mg/dL (ref 6–23)
Creatinine, Ser: 0.6 mg/dL (ref 0.4–1.2)
GFR: 102.48 mL/min (ref >60.00)
Potassium: 4 mEq/L (ref 3.5–5.1)

## 2011-12-28 NOTE — Telephone Encounter (Signed)
New msg Pt wants to talk to you about potassium lab results

## 2011-12-28 NOTE — Telephone Encounter (Signed)
Have her come in to office to see me 11:15 tomorrow 4/26).

## 2011-12-28 NOTE — Telephone Encounter (Signed)
Lab results were given to pt.  She states she is extremely fatigued x 10 days.  She states she had vomiting and diarrhea about the same time but that only lasted about a few days.  She states just coming in for lab work wore her out so much that she had to go to bed when she came home.  She also has no appetite despite eating a bland diet.

## 2011-12-29 NOTE — Telephone Encounter (Signed)
N/A.  LMTC. Pt stated yesterday that she was going out of town for the weekend.

## 2012-01-02 NOTE — Telephone Encounter (Signed)
I called pt to find out how she is doing.  No answer. LMTC.

## 2012-01-05 MED ORDER — POTASSIUM CHLORIDE CRYS ER 20 MEQ PO TBCR: 40.0000 meq | EXTENDED_RELEASE_TABLET | Freq: Every day | ORAL | Status: DC

## 2012-01-05 MED ORDER — DOFETILIDE 250 MCG PO CAPS: 250.0000 ug | ORAL_CAPSULE | Freq: Two times a day (BID) | ORAL | Status: DC

## 2012-01-05 NOTE — Telephone Encounter (Signed)
Pt states she is feeling much better.

## 2012-01-05 NOTE — Telephone Encounter (Signed)
Follow up on previous call   Patient returning call back to nurse  

## 2012-01-11 ENCOUNTER — Encounter (INDEPENDENT_AMBULATORY_CARE_PROVIDER_SITE_OTHER): Payer: BC Managed Care – PPO | Admitting: *Deleted

## 2012-01-11 DIAGNOSIS — I4891 Unspecified atrial fibrillation: Secondary | ICD-10-CM

## 2012-01-11 DIAGNOSIS — Z7901 Long term (current) use of anticoagulants: Secondary | ICD-10-CM

## 2012-01-11 DIAGNOSIS — Z8679 Personal history of other diseases of the circulatory system: Secondary | ICD-10-CM

## 2012-01-11 LAB — POCT INR: INR: 4.3

## 2012-01-25 ENCOUNTER — Ambulatory Visit (INDEPENDENT_AMBULATORY_CARE_PROVIDER_SITE_OTHER): Payer: BC Managed Care – PPO | Admitting: Pharmacist

## 2012-01-25 DIAGNOSIS — I4891 Unspecified atrial fibrillation: Secondary | ICD-10-CM

## 2012-01-25 DIAGNOSIS — Z7901 Long term (current) use of anticoagulants: Secondary | ICD-10-CM

## 2012-01-25 DIAGNOSIS — Z8679 Personal history of other diseases of the circulatory system: Secondary | ICD-10-CM

## 2012-01-25 LAB — POCT INR: INR: 3

## 2012-02-07 ENCOUNTER — Encounter: Payer: Self-pay | Admitting: Cardiology

## 2012-02-07 ENCOUNTER — Ambulatory Visit (INDEPENDENT_AMBULATORY_CARE_PROVIDER_SITE_OTHER): Payer: BC Managed Care – PPO | Admitting: *Deleted

## 2012-02-07 ENCOUNTER — Telehealth: Payer: Self-pay | Admitting: Cardiology

## 2012-02-07 ENCOUNTER — Ambulatory Visit (INDEPENDENT_AMBULATORY_CARE_PROVIDER_SITE_OTHER): Payer: BC Managed Care – PPO | Admitting: Cardiology

## 2012-02-07 VITALS — BP 158/98 | HR 135 | Ht 63.0 in | Wt 178.0 lb

## 2012-02-07 DIAGNOSIS — R0602 Shortness of breath: Secondary | ICD-10-CM

## 2012-02-07 DIAGNOSIS — I050 Rheumatic mitral stenosis: Secondary | ICD-10-CM

## 2012-02-07 DIAGNOSIS — I471 Supraventricular tachycardia: Secondary | ICD-10-CM

## 2012-02-07 DIAGNOSIS — Z7901 Long term (current) use of anticoagulants: Secondary | ICD-10-CM

## 2012-02-07 DIAGNOSIS — R001 Bradycardia, unspecified: Secondary | ICD-10-CM

## 2012-02-07 DIAGNOSIS — I4891 Unspecified atrial fibrillation: Secondary | ICD-10-CM

## 2012-02-07 DIAGNOSIS — I498 Other specified cardiac arrhythmias: Secondary | ICD-10-CM

## 2012-02-07 DIAGNOSIS — I059 Rheumatic mitral valve disease, unspecified: Secondary | ICD-10-CM

## 2012-02-07 DIAGNOSIS — Z01812 Encounter for preprocedural laboratory examination: Secondary | ICD-10-CM

## 2012-02-07 DIAGNOSIS — R9430 Abnormal result of cardiovascular function study, unspecified: Secondary | ICD-10-CM

## 2012-02-07 DIAGNOSIS — R9431 Abnormal electrocardiogram [ECG] [EKG]: Secondary | ICD-10-CM

## 2012-02-07 DIAGNOSIS — Q248 Other specified congenital malformations of heart: Secondary | ICD-10-CM

## 2012-02-07 DIAGNOSIS — Z8679 Personal history of other diseases of the circulatory system: Secondary | ICD-10-CM

## 2012-02-07 DIAGNOSIS — R0989 Other specified symptoms and signs involving the circulatory and respiratory systems: Secondary | ICD-10-CM

## 2012-02-07 DIAGNOSIS — I340 Nonrheumatic mitral (valve) insufficiency: Secondary | ICD-10-CM

## 2012-02-07 LAB — PROTIME-INR: Prothrombin Time: 46.8 s — ABNORMAL HIGH (ref 10.2–12.4)

## 2012-02-07 LAB — CBC WITH DIFFERENTIAL/PLATELET
Basophils Absolute: 0.1 10*3/uL (ref 0.0–0.1)
Eosinophils Absolute: 0.1 10*3/uL (ref 0.0–0.7)
Lymphocytes Relative: 26.7 % (ref 12.0–46.0)
MCHC: 32.4 g/dL (ref 30.0–36.0)
MCV: 88.4 fl (ref 78.0–100.0)
Monocytes Absolute: 0.9 10*3/uL (ref 0.1–1.0)
Neutrophils Relative %: 61.9 % (ref 43.0–77.0)
Platelets: 275 10*3/uL (ref 150.0–400.0)

## 2012-02-07 LAB — BASIC METABOLIC PANEL
BUN: 14 mg/dL (ref 6–23)
GFR: 201.12 mL/min (ref >60.00)
Potassium: 4 mEq/L (ref 3.5–5.1)
Sodium: 143 mEq/L (ref 135–145)

## 2012-02-07 NOTE — Patient Instructions (Addendum)
Your physician recommends that you return for lab work in: today (cbc, bmet, pt.ptt)  Arrive at the [facility] at [place] at 9:00am.

## 2012-02-07 NOTE — Telephone Encounter (Signed)
Pt states she is extremely dizzy, sob, weak and her heart is racing since last week.  She is concerned that her potassium is low.  No swelling.

## 2012-02-07 NOTE — Telephone Encounter (Signed)
Pt to come in now per Dr Myrtis Ser.  She agrees.

## 2012-02-07 NOTE — Assessment & Plan Note (Signed)
The patient does have mild functional mitral stenosis after her mitral valve repair.

## 2012-02-07 NOTE — Assessment & Plan Note (Signed)
The patient also has atrial tachycardia. Tikosyn is used for this and her atrial fib.

## 2012-02-07 NOTE — Assessment & Plan Note (Signed)
The patient's atrial appendage was tied off at the time of surgery. This was proven by TEE in February, 2011.

## 2012-02-07 NOTE — Assessment & Plan Note (Signed)
In 2011 bradycardia was a significant problem after cardioversion. Digoxin, Cardizem, metoprolol which stopped. She almost received a pacemaker. Therefore I will not use any of these today.

## 2012-02-07 NOTE — Assessment & Plan Note (Signed)
The patient is very well anticoagulated.

## 2012-02-07 NOTE — Progress Notes (Signed)
HPI  The patient is seen today as an add-on for shortness of breath and palpitations. Historically she has had different types of supraventricular tachycardia arrhythmias. We know that she developed left ventricular dysfunction at 1 point. Eventually he she was cardioverted on Tikosyn and she is held sinus rhythm. We have not been able to increase her dose because of a prolonged QT interval. Also she cannot take any medications for rate control. This is extremely well documented. She has had marked bradycardia in the past.  Today she called to say that she had palpitations and shortness of breath. We brought her to the office immediately. It turns out that she is actually felt this way for greater than one week. She has exertional shortness of breath. She does feel the palpitations.  Allergies  Allergen Reactions  . Meperidine Hcl   . Morphine   . Tetanus Toxoid     Current Outpatient Prescriptions  Medication Sig Dispense Refill  . aspirin 81 MG tablet Take 81 mg by mouth daily.        Marland Kitchen atorvastatin (LIPITOR) 40 MG tablet TAKE 1 TABLET DAILY  90 tablet  3  . beclomethasone (QVAR) 80 MCG/ACT inhaler One puff at night for two weeks, and if okay then stop Qvar  1 Inhaler  2  . calcium carbonate (OS-CAL) 600 MG TABS Take 600 mg by mouth daily.       . Cholecalciferol (VITAMIN D3) 1000 UNITS CAPS Take 4 capsules by mouth daily.        Marland Kitchen dofetilide (TIKOSYN) 250 MCG capsule Take 1 capsule (250 mcg total) by mouth 2 (two) times daily.  180 capsule  1  . fish oil-omega-3 fatty acids 1000 MG capsule Take 2 g by mouth 2 (two) times daily.        . furosemide (LASIX) 40 MG tablet TAKE ONE-HALF (1/2) TABLET ONCE DAILY  45 tablet  3  . metFORMIN (GLUCOPHAGE) 500 MG tablet Take 500 mg by mouth 2 (two) times daily with a meal.      . potassium chloride SA (K-DUR,KLOR-CON) 20 MEQ tablet Take 2 tablets (40 mEq total) by mouth daily.  180 tablet  3  . trandolapril (MAVIK) 4 MG tablet Take 8 mg by mouth  daily.        Marland Kitchen venlafaxine (EFFEXOR) 37.5 MG tablet Take 37.5 mg by mouth daily.        Marland Kitchen warfarin (COUMADIN) 5 MG tablet Take 1 tablet (5 mg total) by mouth as directed.  30 tablet  1  . albuterol (PROAIR HFA) 108 (90 BASE) MCG/ACT inhaler Two puffs up to four times per day as needed  1 Inhaler  6    History   Social History  . Marital Status: Married    Spouse Name: N/A    Number of Children: N/A  . 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     Social History Main Topics  . Smoking status: Never Smoker   . Smokeless tobacco: Never Used  . Alcohol Use: No  . Drug Use: No  . Sexually Active: Not on file   Other Topics Concern  . Not on file   Social History Narrative   Does not et regular exerciseDaily caffeine use    Family History  Problem Relation Age of Onset  . Cancer Maternal Grandfather   . Heart disease Father   . Heart disease Sister   .  Diabetes Mother   . Diabetes Father   . Diabetes Sister   . Diabetes Paternal Grandfather   . Diabetes Paternal Grandmother   . Heart disease Paternal Uncle     Past Medical History  Diagnosis Date  . DM (diabetes mellitus)   . Atrial fibrillation     Treated with maze procedure / recurrent atrial fibrillation February, 2011... TEE cardioversion October 19, 2009  . Atrial tachycardia     February, 2011... Tikosyn started in hospital  . Bradycardia     Post cardioversion February, 2011,/digoxin/Cardizem metoprolol stopped, may need pacemaker  . Mitral regurgitation     Treated with repair, atrial appendage was removed or tied off at surgery.. this was proven by TEE February, 2011  . Pulmonary HTN     55 mmHg, echo, February, 2011 / no mention of pulmonary hypertension echo, June, 2011  . Right ventricular dysfunction     Mild to moderate, echo, February, 2011 / normalized echo, June, 2011  . TR (tricuspid regurgitation)     Moderate, echo, February, 2011 / trivial, echo,  June, 2011  . Mitral stenosis     Mild, February, 2011, post mitral valve repair / mild, echo, June, 2011  . CAD (coronary artery disease)     LIMA to the LAD at time of mitral valve repair / LIMA atretic,, February, 2011  . Ejection fraction < 50%     EF 30% echo and TEE diagnosis February, 2011, possibly rate related / no contraction by catheter, bradycardia so carvedilol cannot be used / EF 55-60% echo, June, 2011 / EF 50%, echo, February, 2012  . HTN (hypertension)   . Hemorrhoids, internal   . Diverticulosis of colon   . Colon polyp, hyperplastic   . Environmental allergies   . atrial appendage     Removed were tied off at surgery, proven by TEE February, 2011  . S/P mitral valve repair     [facility] / Maze procedure/ atrial appendage removed were tied off  . Hx of CABG     At time of mitral valve repair  . Rash     ?Vicodin, Lotensin, Toprol  . CHF (congestive heart failure)     Mild in hospital February, 2011  . Drug therapy     Tikosyn  . Shortness of breath     Evaluated by Dr. Craige Cotta. Mat [id number].... bronchodilator response,,  medication started  /  patient much improved June 14, 201 2  . QT prolongation     Tikosyn and Effexor. QT prolonged October 13, 2011, peak is in dose reduced from 500  to -250 twice a day    Past Surgical History  Procedure Date  . Atrial appendage removed   . Mitral valve repair   . Coronary artery bypass graft   . Maze   . Vaginal hysterectomy   . Cholecystectomy   . Breast lumpectomy     x 2    ROS Patient denies fever, chills, headache, sweats, rash, change in vision, change in hearing, chest pain, nausea vomiting, urinary symptoms. All other systems are reviewed and are negative.  PHYSICAL EXAM  Patient is here with her husband. She is oriented to person time and place. Affect is normal. There is no jugulovenous distention. Lungs reveal a few scattered rhonchi. Cardiac exam reveals S1 and S2. There no clicks or significant murmurs.  The abdomen is soft. There is no significant peripheral edema. There no musculoskeletal deformities. There are no skin rashes.  Filed  Vitals:   02/07/12 1523  BP: 158/98  Pulse: 135  Height: 5\' 3"  (1.6 m)  Weight: 178 lb (80.74 kg)   EKG is done today and reviewed by me. The patient has rapid atrial fibrillation.  ASSESSMENT & PLAN

## 2012-02-07 NOTE — Assessment & Plan Note (Addendum)
The patient had a Maze procedure when she had her mitral valve repair. Later she had return of atrial fibrillation in February, 2011. She was TEE cardioverted at that time. However it is known that her left atrial appendage was tied off at the time of her surgery. She now has return of atrial fibrillation that is rapid. 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 We need to proceed with cardioversion. He will not be safe to give her medications to slow her rate. If she converts to sinus she could have severe bradycardia with these medicines on board. If we can convert her successfully I may continue her same regimen. If not her Tikosyn will have to be stopped with decisions made about other therapies. This could include amiodarone if we are prepared to place a pacemaker. I'm not sure that she is a candidate for ablation because I think she has multiple different forms of supraventricular tachycardia. I know that she is well anticoagulated. Her INR was 3.0  two weeks ago. Today it is 4.1. In addition she is relatively low risk because her atrial appendage was tied off at the time of her heart surgery.  We are actively making plans for cardioversion tomorrow. Orders will be written in labs obtained.

## 2012-02-07 NOTE — Assessment & Plan Note (Signed)
The patient's ejection fraction had dropped to 30% when she had persistent supraventricular tachycardia in February, 2011. This then improved. Ejection fraction was 55% in February, 2012. Ejection fraction was again 50-55% by echo March, 2013.

## 2012-02-07 NOTE — H&P (Signed)
746.87    QT prolongation     794.31    Ejection fraction < 50%     785.9    Pre-procedural laboratory examination     V72.63          Progress Notes     Anita Rough, MD  02/07/2012  4:06 PM  Pended    HPI  The patient is seen today as an add-on for shortness of breath and palpitations. Historically she has had different types of supraventricular tachycardia arrhythmias. We know that she developed left ventricular dysfunction at 1 point. Eventually he she was cardioverted on Tikosyn and she is held sinus rhythm. We have not been able to increase her dose because of a prolonged QT interval. Also she cannot take any medications for rate control. This is extremely well documented. She has had marked bradycardia in the past.   Today she called to say that she had palpitations and shortness of breath. We brought her to the office immediately. It turns out that she is actually felt this way for greater than one week. She has exertional shortness of breath. She does feel the palpitations.    Allergies   Allergen  Reactions   .  Meperidine Hcl     .  Morphine     .  Tetanus Toxoid         Current Outpatient Prescriptions   Medication  Sig  Dispense  Refill   .  aspirin 81 MG tablet  Take 81 mg by mouth daily.           Marland Kitchen  atorvastatin (LIPITOR) 40 MG tablet  TAKE 1 TABLET DAILY   90 tablet   3   .  beclomethasone (QVAR) 80 MCG/ACT inhaler  One puff at night for two weeks, and if okay then stop Qvar   1 Inhaler   2   .  calcium carbonate (OS-CAL) 600 MG TABS  Take 600 mg by mouth daily.          .  Cholecalciferol (VITAMIN D3) 1000 UNITS CAPS  Take 4 capsules by mouth daily.           Marland Kitchen  dofetilide (TIKOSYN) 250 MCG capsule  Take 1 capsule (250 mcg total) by mouth 2 (two) times daily.   180 capsule   1   .  fish oil-omega-3 fatty acids 1000 MG capsule  Take 2 g by mouth 2 (two) times daily.           .  furosemide (LASIX) 40 MG tablet  TAKE ONE-HALF (1/2) TABLET ONCE DAILY   45 tablet    3   .  metFORMIN (GLUCOPHAGE) 500 MG tablet  Take 500 mg by mouth 2 (two) times daily with a meal.         .  potassium chloride SA (K-DUR,KLOR-CON) 20 MEQ tablet  Take 2 tablets (40 mEq total) by mouth daily.   180 tablet   3   .  trandolapril (MAVIK) 4 MG tablet  Take 8 mg by mouth daily.           Marland Kitchen  venlafaxine (EFFEXOR) 37.5 MG tablet  Take 37.5 mg by mouth daily.           Marland Kitchen  warfarin (COUMADIN) 5 MG tablet  Take 1 tablet (5 mg total) by mouth as directed.   30 tablet   1   .  albuterol (PROAIR HFA) 108 (90 BASE) MCG/ACT inhaler  Two puffs up to  four times per day as needed   1 Inhaler   6       History       Social History   .  Marital Status:  Married       Spouse Name:  N/A       Number of Children:  N/A   .  Years of Education:  N/A       Occupational History   .  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           Social History Main Topics   .  Smoking status:  Never Smoker    .  Smokeless tobacco:  Never Used   .  Alcohol Use:  No   .  Drug Use:  No   .  Sexually Active:  Not on file       Other Topics  Concern   .  Not on file       Social History Narrative     Does not et regular exerciseDaily caffeine use       Family History   Problem  Relation  Age of Onset   .  Cancer  Maternal Grandfather     .  Heart disease  Father     .  Heart disease  Sister     .  Diabetes  Mother     .  Diabetes  Father     .  Diabetes  Sister     .  Diabetes  Paternal Grandfather     .  Diabetes  Paternal Grandmother     .  Heart disease  Paternal Uncle         Past Medical History   Diagnosis  Date   .  DM (diabetes mellitus)     .  Atrial fibrillation         Treated with maze procedure / recurrent atrial fibrillation February, 2011... TEE cardioversion October 19, 2009   .  Atrial tachycardia         February, 2011... Tikosyn started in hospital   .  Bradycardia         Post cardioversion February, 2011,/digoxin/Cardizem metoprolol stopped, may need pacemaker     .  Mitral regurgitation         Treated with repair, atrial appendage was removed or tied off at surgery.. this was proven by TEE February, 2011   .  Pulmonary HTN         55 mmHg, echo, February, 2011 / no mention of pulmonary hypertension echo, June, 2011   .  Right ventricular dysfunction         Mild to moderate, echo, February, 2011 / normalized echo, June, 2011   .  TR (tricuspid regurgitation)         Moderate, echo, February, 2011 / trivial, echo, June, 2011   .  Mitral stenosis         Mild, February, 2011, post mitral valve repair / mild, echo, June, 2011   .  CAD (coronary artery disease)         LIMA to the LAD at time of mitral valve repair / LIMA atretic,, February, 2011   .  Ejection fraction < 50%         EF 30% echo and TEE diagnosis February, 2011, possibly rate related / no contraction by catheter, bradycardia so carvedilol cannot be used / EF 55-60% echo,  June, 2011 / EF 50%, echo, February, 2012   .  HTN (hypertension)     .  Hemorrhoids, internal     .  Diverticulosis of colon     .  Colon polyp, hyperplastic     .  Environmental allergies     .  atrial appendage         Removed were tied off at surgery, proven by TEE February, 2011   .  S/P mitral valve repair         [facility] / Maze procedure/ atrial appendage removed were tied off   .  Hx of CABG         At time of mitral valve repair   .  Rash         ?Vicodin, Lotensin, Toprol   .  CHF (congestive heart failure)         Mild in hospital February, 2011   .  Drug therapy         Tikosyn   .  Shortness of breath         Evaluated by Dr. Craige Cotta. Mat [id number].... bronchodilator response,,  medication started  /  patient much improved June 14, 201 2   .  QT prolongation         Tikosyn and Effexor. QT prolonged October 13, 2011, peak is in dose reduced from 500  to -250 twice a day       Past Surgical History   Procedure  Date   .  Atrial appendage removed     .  Mitral valve repair     .  Coronary  artery bypass graft     .  Maze     .  Vaginal hysterectomy     .  Cholecystectomy     .  Breast lumpectomy         x 2      ROS Patient denies fever, chills, headache, sweats, rash, change in vision, change in hearing, chest pain, nausea vomiting, urinary symptoms. All other systems are reviewed and are negative.   PHYSICAL EXAM  Patient is here with her husband. She is oriented to person time and place. Affect is normal. There is no jugulovenous distention. Lungs reveal a few scattered rhonchi. Cardiac exam reveals S1 and S2. There no clicks or significant murmurs. The abdomen is soft. There is no significant peripheral edema. There no musculoskeletal deformities. There are no skin rashes.    Filed Vitals:     02/07/12 1523   BP:  158/98   Pulse:  135   Height:  5\' 3"  (1.6 m)   Weight:  178 lb (80.74 kg)    EKG is done today and reviewed by me. The patient has rapid atrial fibrillation.   ASSESSMENT & PLAN        Atrial fibrillation - Anita Rough, MD  02/07/2012  4:20 PM  Addendum The patient had a Maze procedure when she had her mitral valve repair. Later she had return of atrial fibrillation in February, 2011. She was TEE cardioverted at that time. However it is known that her left atrial appendage was tied off at the time of her surgery. She now has return of atrial fibrillation that is rapid. 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 We need to proceed with cardioversion. He will not be safe to give her medications to slow her rate. If she converts to sinus she could have severe  bradycardia with these medicines on board. If we can convert her successfully I may continue her same regimen. If not her Tikosyn will have to be stopped with decisions made about other therapies. This could include amiodarone if we are prepared to place a pacemaker. I'm not sure that she is a candidate for ablation because I think she has multiple different forms of supraventricular  tachycardia. I know that she is well anticoagulated. Her INR was 3.0  two weeks ago. Today it is 4.1. In addition she is relatively low risk because her atrial appendage was tied off at the time of her heart surgery.   We are actively making plans for cardioversion tomorrow. Orders will be written in labs obtained.  Previous Version  Atrial tachycardia - Anita Rough, MD  02/07/2012  4:10 PM  Signed The patient also has atrial tachycardia. Tikosyn is used for this and her atrial fib.  Bradycardia - Anita Rough, MD  02/07/2012  4:11 PM  Signed In 2011 bradycardia was a significant problem after cardioversion. Digoxin, Cardizem, metoprolol which stopped. She almost received a pacemaker. Therefore I will not use any of these today.  Mitral regurgitation - Anita Rough, MD  02/07/2012  4:19 PM  Addendum The patient had a mitral valve repair in the past. This was quite successful. It was done at the [facility] before our team here in [place] started doing higher volume mitral valve repairs.Her most recent echo was March, 2013. There was slight functional mitral stenosis. There was mild mitral regurgitation.  Previous Version  Mitral stenosis - Anita Rough, MD  02/07/2012  4:13 PM  Signed The patient does have mild functional mitral stenosis after her mitral valve repair.  Warfarin anticoagulation - Anita Rough, MD  02/07/2012  4:14 PM  Signed The patient is very well anticoagulated.  atrial appendage - Anita Rough, MD  02/07/2012  4:14 PM  Signed The patient's atrial appendage was tied off at the time of surgery. This was proven by TEE in February, 2011.  QT prolongation - Anita Rough, MD  02/07/2012  4:16 PM  Signed The patient is on Tikosyn and effexor. Her Tikosyn dose was decreased. Over time I have considered whether the drug needs to be stopped completely. However has been very effective for her.  Ejection fraction < 50% - Anita Rough, MD  02/07/2012  4:17 PM  Signed The patient's ejection  fraction had dropped to 30% when she had persistent supraventricular tachycardia in February, 2011. This then improved. Ejection fraction was 55% in February, 2012. Ejection fraction was again 50-55% by echo March, 2013.

## 2012-02-07 NOTE — Assessment & Plan Note (Addendum)
The patient had a mitral valve repair in the past. This was quite successful. It was done at the [facility] before our team here in [place] started doing higher volume mitral valve repairs.Her most recent echo was March, 2013. There was slight functional mitral stenosis. There was mild mitral regurgitation.

## 2012-02-07 NOTE — Assessment & Plan Note (Signed)
The patient is on Tikosyn and effexor. Her Tikosyn dose was decreased. Over time I have considered whether the drug needs to be stopped completely. However has been very effective for her.

## 2012-02-07 NOTE — Telephone Encounter (Signed)
New Problem:    Patient called in because she thinks her potassium has dropped. She is dizzy and can barely walk form room to room in her house. Patient would like to know how to proceed.  Please call back.

## 2012-02-08 ENCOUNTER — Encounter (HOSPITAL_COMMUNITY): Payer: Self-pay | Admitting: Anesthesiology

## 2012-02-08 ENCOUNTER — Ambulatory Visit (HOSPITAL_COMMUNITY): Payer: BC Managed Care – PPO | Admitting: Anesthesiology

## 2012-02-08 ENCOUNTER — Ambulatory Visit (HOSPITAL_COMMUNITY)
Admission: RE | Admit: 2012-02-08 | Discharge: 2012-02-08 | Disposition: A | Discharge status: Home / Self Care | Payer: BC Managed Care – PPO | Source: Ambulatory Visit | Attending: Cardiology | Admitting: Cardiology

## 2012-02-08 ENCOUNTER — Encounter (HOSPITAL_COMMUNITY)
Admission: RE | Disposition: A | Discharge status: Home / Self Care | Payer: Self-pay | Source: Ambulatory Visit | Attending: Cardiology

## 2012-02-08 DIAGNOSIS — I4891 Unspecified atrial fibrillation: Secondary | ICD-10-CM

## 2012-02-08 DIAGNOSIS — J449 Chronic obstructive pulmonary disease, unspecified: Secondary | ICD-10-CM | POA: Insufficient documentation

## 2012-02-08 DIAGNOSIS — I10 Essential (primary) hypertension: Secondary | ICD-10-CM | POA: Insufficient documentation

## 2012-02-08 DIAGNOSIS — I509 Heart failure, unspecified: Secondary | ICD-10-CM | POA: Insufficient documentation

## 2012-02-08 DIAGNOSIS — E119 Type 2 diabetes mellitus without complications: Secondary | ICD-10-CM | POA: Insufficient documentation

## 2012-02-08 DIAGNOSIS — R0602 Shortness of breath: Secondary | ICD-10-CM

## 2012-02-08 DIAGNOSIS — J4489 Other specified chronic obstructive pulmonary disease: Secondary | ICD-10-CM | POA: Insufficient documentation

## 2012-02-08 DIAGNOSIS — I2510 Atherosclerotic heart disease of native coronary artery without angina pectoris: Secondary | ICD-10-CM | POA: Insufficient documentation

## 2012-02-08 HISTORY — PX: CARDIOVERSION: SHX1299

## 2012-02-08 SURGERY — CARDIOVERSION
Anesthesia: General | Wound class: Clean

## 2012-02-08 MED ORDER — SODIUM CHLORIDE 0.9 % IV SOLN: 250.0000 mL | INTRAVENOUS | Status: DC

## 2012-02-08 MED ORDER — HYDROCORTISONE 1 % EX CREA: 1.0000 "application " | TOPICAL_CREAM | Freq: Three times a day (TID) | CUTANEOUS | Status: DC | PRN

## 2012-02-08 MED ORDER — SODIUM CHLORIDE 0.9 % IJ SOLN: 3.0000 mL | Freq: Two times a day (BID) | INTRAMUSCULAR | Status: DC

## 2012-02-08 MED ORDER — SODIUM CHLORIDE 0.9 % IV SOLN
INTRAVENOUS | Status: DC
  Administered 2012-02-08: 10:00:00 via INTRAVENOUS

## 2012-02-08 MED ORDER — PROPOFOL 10 MG/ML IV EMUL
INTRAVENOUS | Status: DC | PRN
  Administered 2012-02-08: 25 mg via INTRAVENOUS
  Administered 2012-02-08: 45 mg via INTRAVENOUS

## 2012-02-08 MED ORDER — SODIUM CHLORIDE 0.9 % IJ SOLN: 3.0000 mL | INTRAMUSCULAR | Status: DC | PRN

## 2012-02-08 MED ORDER — SODIUM CHLORIDE 0.9 % IV SOLN
INTRAVENOUS | Status: DC | PRN
  Administered 2012-02-08: 11:00:00 via INTRAVENOUS

## 2012-02-08 NOTE — Preoperative (Signed)
Beta Blockers   Reason not to administer Beta Blockers:Not Applicable 

## 2012-02-08 NOTE — Transfer of Care (Signed)
Immediate Anesthesia Transfer of Care Note  Patient: Anita Duke  Procedure(s) Performed: Procedure(s) (LRB): CARDIOVERSION (N/A)  Patient Location: PACU and Short Stay  Anesthesia Type: General  Level of Consciousness: awake, alert , oriented and patient cooperative  Airway & Oxygen Therapy: Patient Spontanous Breathing and Patient connected to nasal cannula oxygen  Post-op Assessment: Report given to PACU RN and Post -op Vital signs reviewed and stable  Post vital signs: Reviewed and stable  Complications: No apparent anesthesia complications

## 2012-02-08 NOTE — Progress Notes (Signed)
D/C home; s/p elective cardioversion; follow-up with Dr. Myrtis Ser 02/09/2012 at 1330

## 2012-02-08 NOTE — Procedures (Signed)
Electrical Cardioversion Procedure Note Anita Duke [id number] [birth year]  Procedure: Electrical Cardioversion Indications:  Atrial Fibrillation  Procedure Details Consent: Risks of procedure as well as the alternatives and risks of each were explained to the (patient/caregiver).  Consent for procedure obtained. Time Out: Verified patient identification, verified procedure, site/side was marked, verified correct patient position, special equipment/implants available, medications/allergies/relevent history reviewed, required imaging and test results available.  Performed  Patient placed on cardiac monitor, pulse oximetry, supplemental oxygen as necessary.  Sedation given: the patient was given 40 mg of IV propofol. She converted to sinus. She then reverted to a different supraventricular arrhythmia. She received 25 mg of additional propofol.  She was cardioverted a second time and return to sinus rhythm Pacer pads placed anterior and posterior chest.  Cardioverted 2 time(s).  Cardioverted at 120J./  200J  Evaluation Findings: The initial rhythm was atrial fibrillation. The patient was shocked with 120 J of biphasic energy. She converted to sinus rhythm. She had PACs. In approximately 2-3 minutes she reverted to a supraventricular tachycardia that was different from her atrial fibrillation. The rhythm was regular. The rate was approximately 125. She then underwent a second cardioversion with 200 J of energy. She converted to sinus rhythm. She is currently being watched to see if she holds sinus or not. Complications: None Patient did tolerate procedure well.   Anita Duke 02/08/2012, 10:49 AM

## 2012-02-08 NOTE — Discharge Instructions (Signed)
02/09/12  At 1:30 PM   Electrical Cardioversion Cardioversion is the delivery of a jolt of electricity to change the rhythm of the heart. Sticky patches or metal paddles are placed on the chest to deliver the electricity from a special device. This is done to restore a normal rhythm. A rhythm that is too fast or not regular keeps the heart from pumping well. Compared to medicines used to change an abnormal rhythm, cardioversion is faster and works better. It is also unpleasant and may dislodge blood clots from the heart. WHEN WOULD THIS BE DONE?  In an emergency:   There is low or no blood pressure as a result of the heart rhythm.   Normal rhythm must be restored as fast as possible to protect the brain and heart from further damage.   It may save a life.   For less serious heart rhythms, such as atrial fibrillation or flutter, in which:   The heart is beating too fast or is not regular.   The heart is still able to pump enough blood, but not as well as it should.   Medicine to change the rhythm has not worked.   It is safe to wait in order to allow time for preparation.  LET YOUR CAREGIVER KNOW ABOUT:   Every medicine you are taking. It is very important to do this! Know when to take or stop taking any of them.   Any time in the past that you have felt your heart was not beating normally.  RISKS AND COMPLICATIONS   Clots may form in the chambers of the heart if it is beating too fast. These clots may be dislodged during the procedure and travel to other parts of the body.   There is risk of a stroke during and after the procedure if a clot moves. Blood thinners lower this risk.   You may have a special test of your heart (TEE) to make sure there are no clots in your heart.  BEFORE THE PROCEDURE   You may have some tests to see how well your heart is working.   You may start taking blood thinners so your blood does not clot as easily.   Other drugs may be given to help your  heart work better.  PROCEDURE (SCHEDULED)  The procedure is typically done in a hospital by a heart doctor (cardiologist).   You will be told when and where to go.   You may be given some medicine through an intravenous (IV) access to reduce discomfort and make you sleepy before the procedure.   Your whole body may move when the shock is delivered. Your chest may feel sore.   You may be able to go home after a few hours. Your heart rhythm will be watched to make sure it does not change.  HOME CARE INSTRUCTIONS   Only take medicine as directed by your caregiver. Be sure you understand how and when to take your medicine.   Learn how to feel your pulse and check it often.   Limit your activity for 48 hours.   Avoid caffeine and other stimulants as directed.  SEEK MEDICAL CARE IF:   You feel like your heart is beating too fast or your pulse is not regular.   You have any questions about your medicines.   You have bleeding that will not stop.  SEEK IMMEDIATE MEDICAL CARE IF:   You are dizzy or feel faint.   It is hard to breathe  or you feel short of breath.   There is a change in discomfort in your chest.   Your speech is slurred or you have trouble moving your arm or leg on one side.   You get a muscle cramp.   Your fingers or toes turn cold or blue.  MAKE SURE YOU:   Understand these instructions.   Will watch your condition.   Will get help right away if you are not doing well or get worse.  Document Released: 08/11/2002 Document Revised: 08/10/2011 Document Reviewed: 12/11/2007 [facility] Patient Information 2012 [place], [place].

## 2012-02-08 NOTE — Anesthesia Preprocedure Evaluation (Signed)
Anesthesia Evaluation  Patient identified by MRN, date of birth, ID band Patient awake    Reviewed: Allergy & Precautions, H&P , NPO status , Patient's Chart, lab work & pertinent test results  Airway Mallampati: I TM Distance: >3 FB Neck ROM: full    Dental   Pulmonary shortness of breath, asthma , COPD         Cardiovascular hypertension, + CAD and +CHF + dysrhythmias Atrial Fibrillation Rhythm:irregular Rate:Normal     Neuro/Psych    GI/Hepatic   Endo/Other  Diabetes mellitus-, Type 2, Oral Hypoglycemic Agents  Renal/GU      Musculoskeletal   Abdominal   Peds  Hematology   Anesthesia Other Findings   Reproductive/Obstetrics                           Anesthesia Physical Anesthesia Plan  ASA: IV  Anesthesia Plan: General   Post-op Pain Management:    Induction: Intravenous  Airway Management Planned: Mask  Additional Equipment:   Intra-op Plan:   Post-operative Plan:   Informed Consent: I have reviewed the patients History and Physical, chart, labs and discussed the procedure including the risks, benefits and alternatives for the proposed anesthesia with the patient or authorized representative who has indicated his/her understanding and acceptance.     Plan Discussed with: CRNA, Anesthesiologist and Surgeon  Anesthesia Plan Comments:         Anesthesia Quick Evaluation

## 2012-02-08 NOTE — Anesthesia Postprocedure Evaluation (Signed)
  Anesthesia Post-op Note  Patient: Anita Duke  Procedure(s) Performed: Procedure(s) (LRB): CARDIOVERSION (N/A)  Patient Location: Short Stay  Anesthesia Type: General  Level of Consciousness: awake, sedated and patient cooperative  Airway and Oxygen Therapy: Patient Spontanous Breathing and Patient connected to nasal cannula oxygen  Post-op Pain: none  Post-op Assessment: Post-op Vital signs reviewed, Patient's Cardiovascular Status Stable, Respiratory Function Stable, Patent Airway, No signs of Nausea or vomiting and Pain level controlled  Post-op Vital Signs: stable  Complications: No apparent anesthesia complications

## 2012-02-09 ENCOUNTER — Telehealth: Payer: Self-pay | Admitting: Cardiology

## 2012-02-09 ENCOUNTER — Encounter: Payer: Self-pay | Admitting: Cardiology

## 2012-02-09 ENCOUNTER — Ambulatory Visit (INDEPENDENT_AMBULATORY_CARE_PROVIDER_SITE_OTHER): Payer: BC Managed Care – PPO | Admitting: Cardiology

## 2012-02-09 VITALS — BP 142/73 | HR 79 | Ht 63.0 in | Wt 172.0 lb

## 2012-02-09 DIAGNOSIS — I4891 Unspecified atrial fibrillation: Secondary | ICD-10-CM

## 2012-02-09 MED ORDER — AMIODARONE HCL 200 MG PO TABS: 400.0000 mg | ORAL_TABLET | Freq: Every day | ORAL | Status: DC

## 2012-02-09 NOTE — Progress Notes (Signed)
HPI Patient returns today for followup of her atrial arrhythmias. When I saw her in the office on February 07, 2012 decision was made to proceed with cardioversion the following day. Yesterday she was cardioverted. She was treated with 120 J and converted to sinus rhythm. However after 2 or 3 minutes she reverted to a regular rhythm at a rate of approximately 130. She received further anesthesia and was cardioverted a second time with 200 J. She converted to sinus. Over the next hour she did have some supraventricular tachycardia as long as several minutes. She then converted to sinus rhythm. After careful discussion I let her go home with plans for followup today.  The patient returns today and feels well at the moment. However her husband insists that she has only felt well for the past few hours. Her EKG at this point shows sinus rhythm.    Allergies  Allergen Reactions  . Meperidine Hcl   . Morphine   . Tetanus Toxoid     Current Outpatient Prescriptions  Medication Sig Dispense Refill  . acetaminophen (TYLENOL) 500 MG tablet Take 1,000 mg by mouth every 6 (six) hours as needed. For pain      . albuterol (PROVENTIL HFA;VENTOLIN HFA) 108 (90 BASE) MCG/ACT inhaler Inhale 2 puffs into the lungs every 6 (six) hours as needed. For shortness of breath      . aspirin 81 MG tablet Take 81 mg by mouth daily.        Marland Kitchen atorvastatin (LIPITOR) 40 MG tablet Take 40 mg by mouth daily.      . calcium carbonate (OS-CAL) 600 MG TABS Take 600 mg by mouth daily.       . Cholecalciferol (VITAMIN D3) 1000 UNITS CAPS Take 4 capsules by mouth daily.        Marland Kitchen dofetilide (TIKOSYN) 250 MCG capsule Take 1 capsule (250 mcg total) by mouth 2 (two) times daily.  180 capsule  1  . fish oil-omega-3 fatty acids 1000 MG capsule Take 2 g by mouth 2 (two) times daily.        . furosemide (LASIX) 40 MG tablet Take 20 mg by mouth daily.      . metFORMIN (GLUCOPHAGE) 500 MG tablet Take 500 mg by mouth 2 (two) times daily with a  meal.      . potassium chloride SA (K-DUR,KLOR-CON) 20 MEQ tablet Take 2 tablets (40 mEq total) by mouth daily.  180 tablet  3  . trandolapril (MAVIK) 4 MG tablet Take 8 mg by mouth daily.        Marland Kitchen venlafaxine (EFFEXOR) 37.5 MG tablet Take 37.5 mg by mouth daily.        Marland Kitchen warfarin (COUMADIN) 5 MG tablet Take 5 mg by mouth daily. Take 5 mg on mon, wed, fri. Take 2.5 mg the rest of the week.      . beclomethasone (QVAR) 80 MCG/ACT inhaler One puff at night for two weeks, and if okay then stop Qvar  1 Inhaler  2   No current facility-administered medications for this visit.   Facility-Administered Medications Ordered in Other Visits  Medication Dose Route Frequency Provider Last Rate Last Dose  . DISCONTD: 0.9 %  sodium chloride infusion   Intravenous Continuous [name], MD 75 mL/hr at 02/08/12 1014    . DISCONTD: 0.9 %  sodium chloride infusion  250 mL Intravenous Continuous [name], MD      . DISCONTD: hydrocortisone cream 1 % 1 application  1 application Topical TID PRN [name], MD      . DISCONTD: sodium chloride 0.9 % injection 3 mL  3 mL Intravenous Q12H [name], MD      . DISCONTD: sodium chloride 0.9 % injection 3 mL  3 mL Intravenous PRN [name], MD        History   Social History  . Marital Status: Married    Spouse Name: N/A    Number of Children: N/A  . 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     Social History Main Topics  . Smoking status: Never Smoker   . Smokeless tobacco: Never Used  . Alcohol Use: No  . Drug Use: No  . Sexually Active: Not on file   Other Topics Concern  . Not on file   Social History Narrative   Does not et regular exerciseDaily caffeine use    Family History  Problem Relation Age of Onset  . Cancer Maternal Grandfather   . Heart disease Father   . Heart disease Sister   . Diabetes Mother   . Diabetes Father   . Diabetes Sister   . Diabetes Paternal  Grandfather   . Diabetes Paternal Grandmother   . Heart disease Paternal Uncle     Past Medical History  Diagnosis Date  . DM (diabetes mellitus)   . Atrial fibrillation     Treated with maze procedure / recurrent atrial fibrillation February, 2011... TEE cardioversion October 19, 2009  . Atrial tachycardia     February, 2011... Tikosyn started in hospital  . Bradycardia     Post cardioversion February, 2011,/digoxin/Cardizem metoprolol stopped, may need pacemaker  . Mitral regurgitation     Treated with repair, atrial appendage was removed or tied off at surgery.. this was proven by TEE February, 2011  . Pulmonary HTN     55 mmHg, echo, February, 2011 / no mention of pulmonary hypertension echo, June, 2011  . Right ventricular dysfunction     Mild to moderate, echo, February, 2011 / normalized echo, June, 2011  . TR (tricuspid regurgitation)     Moderate, echo, February, 2011 / trivial, echo, June, 2011  . Mitral stenosis     Mild, February, 2011, post mitral valve repair / mild, echo, June, 2011  . CAD (coronary artery disease)     LIMA to the LAD at time of mitral valve repair / LIMA atretic,, February, 2011  . Ejection fraction < 50%     EF 30% echo and TEE diagnosis February, 2011, possibly rate related / no contraction by catheter, bradycardia so carvedilol cannot be used / EF 55-60% echo, June, 2011 / EF 50%, echo, February, 2012  . HTN (hypertension)   . Hemorrhoids, internal   . Diverticulosis of colon   . Colon polyp, hyperplastic   . Environmental allergies   . atrial appendage     Removed were tied off at surgery, proven by TEE February, 2011  . S/P mitral valve repair     [facility] / Maze procedure/ atrial appendage removed were tied off  . Hx of CABG     At time of mitral valve repair  . Rash     ?Vicodin, Lotensin, Toprol  . CHF (congestive heart failure)     Mild in hospital February, 2011  . Drug therapy     Tikosyn  . Shortness of breath  Evaluated by Dr. Craige Cotta. Mat [id number].... bronchodilator response,,  medication started  /  patient much improved June 14, 201 2  . QT prolongation     Tikosyn and Effexor. QT prolonged October 13, 2011, peak is in dose reduced from 500  to -250 twice a day    Past Surgical History  Procedure Date  . Atrial appendage removed   . Mitral valve repair   . Coronary artery bypass graft   . Maze   . Vaginal hysterectomy   . Cholecystectomy   . Breast lumpectomy     x 2    ROS Patient denies fever, chills, headache, sweats, rash, change in vision, change in hearing, chest pain, cough, nausea vomiting, urinary symptoms. All other systems are reviewed and are negative.  PHYSICAL EXAM Patient is oriented to person time and place. Affect is normal. Head is atraumatic. There is no jugular venous distention. Lungs are clear. Respiratory effort is nonlabored. Cardiac exam reveals S1 and S2. There no clicks or significant murmurs. Abdomen is soft. There is no peripheral edema.  Filed Vitals:   02/09/12 1333  BP: 142/73  Pulse: 79  Height: 5\' 3"  (1.6 m)  Weight: 172 lb (78.019 kg)   EKG is done today. The rhythm seems to be regular. It is most probably sinus rhythm with her very small P waves. The rate is 75.  ASSESSMENT & PLAN

## 2012-02-09 NOTE — Telephone Encounter (Signed)
Pharmacy concerned about the drug interaction between tikosyn and amiodarone.  They were notified that the Tikosyn was discontinued.

## 2012-02-09 NOTE — Patient Instructions (Addendum)
Your physician recommends that you schedule a follow-up appointment in: Tuesday pm  Your physician has recommended you make the following change in your medication: Start Amiodarone 200mg  two tabs daily after you talk to Dr Myrtis Ser on Monday.  STOP your Joice Lofts  Do not return to work on Monday per Dr Myrtis Ser

## 2012-02-09 NOTE — Telephone Encounter (Signed)
New msg [facility] wants to clarify dosage of amiodarone

## 2012-02-09 NOTE — Assessment & Plan Note (Signed)
Yesterday I discussed the case completely with Dr.Allred. We both feel that the Tikosyn is no longer effective.  I explained this to the patient. I have instructed her to stop her Tikosyn as of now. This means that she will be off the drug for 72 hours as of Monday morning February 12, 2012. She will start amiodarone by mouth at that time. I will be speaking to her and give her the final dose recommendation on Monday. If there any problems with this approach over the weekend she will come into the hospital. If we sent any problems with bradycardia we will proceed with pacemaker.

## 2012-02-09 NOTE — Telephone Encounter (Signed)
New msg [facility] on [address] has some questions about amiodarone

## 2012-02-13 ENCOUNTER — Ambulatory Visit (INDEPENDENT_AMBULATORY_CARE_PROVIDER_SITE_OTHER): Payer: BC Managed Care – PPO | Admitting: *Deleted

## 2012-02-13 ENCOUNTER — Encounter: Payer: Self-pay | Admitting: Cardiology

## 2012-02-13 ENCOUNTER — Ambulatory Visit (INDEPENDENT_AMBULATORY_CARE_PROVIDER_SITE_OTHER): Payer: BC Managed Care – PPO | Admitting: Cardiology

## 2012-02-13 VITALS — BP 98/68 | HR 108 | Ht 64.0 in | Wt 172.0 lb

## 2012-02-13 DIAGNOSIS — Z7901 Long term (current) use of anticoagulants: Secondary | ICD-10-CM

## 2012-02-13 DIAGNOSIS — I4891 Unspecified atrial fibrillation: Secondary | ICD-10-CM

## 2012-02-13 DIAGNOSIS — Z8679 Personal history of other diseases of the circulatory system: Secondary | ICD-10-CM

## 2012-02-13 LAB — POCT INR: INR: 3.4

## 2012-02-13 NOTE — Assessment & Plan Note (Signed)
The patient is now off Tikosyn and amiodarone was started yesterday. The plan will be for her to take 400 mg twice a day for the next 4 days. She will then decrease the dose to 200 twice a day until I see her next week. I will not be having any other medications for atrial fib rate control. We have been concerned all along that she may have bradycardia on amiodarone. I want to be sure that if she converts to sinus that she does not have a rate any slower than necessary while on amiodarone. I explained this to her greatly.  The patient mentioned that she had heard about ablation. I had a long discussion with her and her husband about atrial fibrillation. I've actually discussed this already with Dr. Johney Frame. One concern that we have is that she has multiple different atrial arrhythmias. It would be better if we could maintain sinus rhythm with amiodarone and use a pacemaker if needed for bradycardia.  I spent greater than 45 minutes reviewing the entire situation with the patient and her husband. They understand.

## 2012-02-13 NOTE — Progress Notes (Signed)
HPI   The patient is seen today to followup her atrial arrhythmia.There is a very complete note about her from my office note dated February 09, 2012.  The patient stopped her Tikosyn and has felt well over the weekend. I called her daily. When she was off the drug for 72 hours we started amiodarone. She took 600 mg yesterday and tolerated it well. She says she is feeling relatively well. In the office today her EKG does show return of atrial fibrillation. This is not surprising with her off Tikosyn. However her rate in atrial fib is only 110. This is lower than her prior rate. It suggests that she is already getting some rate control from the amiodarone.  Allergies  Allergen Reactions  . Meperidine Hcl   . Morphine   . Tetanus Toxoid     Current Outpatient Prescriptions  Medication Sig Dispense Refill  . acetaminophen (TYLENOL) 500 MG tablet Take 1,000 mg by mouth every 6 (six) hours as needed. For pain      . albuterol (PROVENTIL HFA;VENTOLIN HFA) 108 (90 BASE) MCG/ACT inhaler Inhale 2 puffs into the lungs every 6 (six) hours as needed. For shortness of breath      . amiodarone (PACERONE) 200 MG tablet Take 200 mg by mouth 2 (two) times daily.      Marland Kitchen aspirin 81 MG tablet Take 81 mg by mouth daily.        Marland Kitchen atorvastatin (LIPITOR) 40 MG tablet Take 40 mg by mouth daily.      . beclomethasone (QVAR) 80 MCG/ACT inhaler One puff at night for two weeks, and if okay then stop Qvar  1 Inhaler  2  . calcium carbonate (OS-CAL) 600 MG TABS Take 600 mg by mouth daily.       . Cholecalciferol (VITAMIN D3) 1000 UNITS CAPS Take 4 capsules by mouth daily.        . fish oil-omega-3 fatty acids 1000 MG capsule Take 2 g by mouth 2 (two) times daily.        . furosemide (LASIX) 40 MG tablet Take 20 mg by mouth daily.      . metFORMIN (GLUCOPHAGE) 500 MG tablet Take 500 mg by mouth 2 (two) times daily with a meal.      . potassium chloride SA (K-DUR,KLOR-CON) 20 MEQ tablet Take 2 tablets (40 mEq total) by mouth  daily.  180 tablet  3  . trandolapril (MAVIK) 4 MG tablet Take 8 mg by mouth daily.        Marland Kitchen venlafaxine (EFFEXOR) 37.5 MG tablet Take 37.5 mg by mouth daily.        Marland Kitchen warfarin (COUMADIN) 5 MG tablet Take 5 mg by mouth daily. Take 5 mg on mon, wed, fri. Take 2.5 mg the rest of the week.      Marland Kitchen DISCONTD: amiodarone (PACERONE) 200 MG tablet Take 2 tablets (400 mg total) by mouth daily.  60 tablet  11    History   Social History  . Marital Status: Married    Spouse Name: N/A    Number of Children: N/A  . 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     Social History Main Topics  . Smoking status: Never Smoker   . Smokeless tobacco: Never Used  . Alcohol Use: No  . Drug Use: No  . Sexually Active: Not on file   Other Topics Concern  .  Not on file   Social History Narrative   Does not et regular exerciseDaily caffeine use    Family History  Problem Relation Age of Onset  . Cancer Maternal Grandfather   . Heart disease Father   . Heart disease Sister   . Diabetes Mother   . Diabetes Father   . Diabetes Sister   . Diabetes Paternal Grandfather   . Diabetes Paternal Grandmother   . Heart disease Paternal Uncle     Past Medical History  Diagnosis Date  . DM (diabetes mellitus)   . Atrial fibrillation     Treated with maze procedure / recurrent atrial fibrillation February, 2011... TEE cardioversion October 19, 2009  . Atrial tachycardia     February, 2011... Tikosyn started in hospital  . Bradycardia     Post cardioversion February, 2011,/digoxin/Cardizem metoprolol stopped, may need pacemaker  . Mitral regurgitation     Treated with repair, atrial appendage was removed or tied off at surgery.. this was proven by TEE February, 2011  . Pulmonary HTN     55 mmHg, echo, February, 2011 / no mention of pulmonary hypertension echo, June, 2011  . Right ventricular dysfunction     Mild to moderate, echo, February, 2011 / normalized  echo, June, 2011  . TR (tricuspid regurgitation)     Moderate, echo, February, 2011 / trivial, echo, June, 2011  . Mitral stenosis     Mild, February, 2011, post mitral valve repair / mild, echo, June, 2011  . CAD (coronary artery disease)     LIMA to the LAD at time of mitral valve repair / LIMA atretic,, February, 2011  . Ejection fraction < 50%     EF 30% echo and TEE diagnosis February, 2011, possibly rate related / no contraction by catheter, bradycardia so carvedilol cannot be used / EF 55-60% echo, June, 2011 / EF 50%, echo, February, 2012  . HTN (hypertension)   . Hemorrhoids, internal   . Diverticulosis of colon   . Colon polyp, hyperplastic   . Environmental allergies   . atrial appendage     Removed were tied off at surgery, proven by TEE February, 2011  . S/P mitral valve repair     [facility] / Maze procedure/ atrial appendage removed were tied off  . Hx of CABG     At time of mitral valve repair  . Rash     ?Vicodin, Lotensin, Toprol  . CHF (congestive heart failure)     Mild in hospital February, 2011  . Drug therapy     Tikosyn,  stopped 02/10/2012  . Shortness of breath     Evaluated by Dr. Craige Cotta. Mat [id number].... bronchodilator response,,  medication started  /  patient much improved June 14, 201 2  . QT prolongation     Tikosyn and Effexor. QT prolonged October 13, 2011, peak is in dose reduced from 500  to -250 twice a day  . H/O amiodarone therapy     Satrted 02/12/2012    Past Surgical History  Procedure Date  . Atrial appendage removed   . Mitral valve repair   . Coronary artery bypass graft   . Maze   . Vaginal hysterectomy   . Cholecystectomy   . Breast lumpectomy     x 2  . Cardioversion 02/08/2012    Procedure: CARDIOVERSION;  Surgeon: [name], MD;  Location: [facility] OR;  Service: Cardiovascular;  Laterality: N/A;    ROS Patient denies fever, chills, headache, sweats,  rash, change in vision, change in hearing, chest pain, cough, nausea vomiting,  urinary symptoms. All of the systems are reviewed and are negative.  PHYSICAL EXAM  Patient is here with her husband today. She looks good. There is no jugular venous distention. Lungs are clear. Respiratory effort is nonlabored. Cardiac exam reveals S1 and S2. There no clicks or significant murmurs. Abdomen is soft. She has no significant peripheral edema. There no musculoskeletal deformities. There are no skin rashes.  Filed Vitals:   02/13/12 1619  BP: 98/68  Pulse: 108  Height: 5\' 4"  (1.626 m)  Weight: 172 lb (78.019 kg)   EKG is done today and reviewed by me. There is atrial fib with a rate of approximately 110.  ASSESSMENT & PLAN

## 2012-02-13 NOTE — Patient Instructions (Signed)
Your physician has recommended you make the following change in your medication: Amiodarone dosing as follows:  Tuesday (today) take 2 tabs tonight, On Wed, Thurs and Friday, take two tabs twice daily.  Starting on Saturday until your next appt on Tuesday, take one tab twice daily.  Your physician recommends that you schedule a follow-up appointment in: Tuesday, 02/20/12.

## 2012-02-15 ENCOUNTER — Encounter: Payer: Self-pay | Admitting: Cardiology

## 2012-02-20 ENCOUNTER — Encounter: Payer: Self-pay | Admitting: Cardiology

## 2012-02-20 ENCOUNTER — Ambulatory Visit (INDEPENDENT_AMBULATORY_CARE_PROVIDER_SITE_OTHER): Payer: BC Managed Care – PPO

## 2012-02-20 ENCOUNTER — Ambulatory Visit (INDEPENDENT_AMBULATORY_CARE_PROVIDER_SITE_OTHER): Payer: BC Managed Care – PPO | Admitting: Cardiology

## 2012-02-20 VITALS — BP 130/70 | HR 98 | Ht 63.0 in | Wt 170.0 lb

## 2012-02-20 DIAGNOSIS — Z9229 Personal history of other drug therapy: Secondary | ICD-10-CM

## 2012-02-20 DIAGNOSIS — I4891 Unspecified atrial fibrillation: Secondary | ICD-10-CM

## 2012-02-20 DIAGNOSIS — Z8679 Personal history of other diseases of the circulatory system: Secondary | ICD-10-CM

## 2012-02-20 DIAGNOSIS — Z7901 Long term (current) use of anticoagulants: Secondary | ICD-10-CM

## 2012-02-20 DIAGNOSIS — Z09 Encounter for follow-up examination after completed treatment for conditions other than malignant neoplasm: Secondary | ICD-10-CM

## 2012-02-20 LAB — POCT INR: INR: 2.9

## 2012-02-20 NOTE — Assessment & Plan Note (Signed)
Patient is being very carefully anticoagulated so that she will be parents for cardioversion.

## 2012-02-20 NOTE — Assessment & Plan Note (Signed)
The patient is receiving amiodarone load. This will continue.

## 2012-02-20 NOTE — Assessment & Plan Note (Signed)
Patient continues in atrial fibrillation. I will continue to load her with amiodarone 400 mg daily. I will see her back next Wednesday. At that time we will make a final decision about proceeding with cardioversion. She is anticoagulated.

## 2012-02-20 NOTE — Progress Notes (Signed)
HPI  Patient returns today for followup of atrial fibrillation.I have been loading her with amiodarone. Her rate is under reasonable control. She feels better. However she has not converted to sinus rhythm.  Allergies  Allergen Reactions  . Meperidine Hcl   . Morphine   . Tetanus Toxoid     Current Outpatient Prescriptions  Medication Sig Dispense Refill  . acetaminophen (TYLENOL) 500 MG tablet Take 1,000 mg by mouth every 6 (six) hours as needed. For pain      . albuterol (PROVENTIL HFA;VENTOLIN HFA) 108 (90 BASE) MCG/ACT inhaler Inhale 2 puffs into the lungs every 6 (six) hours as needed. For shortness of breath      . amiodarone (PACERONE) 200 MG tablet Take 200 mg by mouth 2 (two) times daily.      Marland Kitchen aspirin 81 MG tablet Take 81 mg by mouth daily.        Marland Kitchen atorvastatin (LIPITOR) 40 MG tablet Take 40 mg by mouth daily.      . beclomethasone (QVAR) 80 MCG/ACT inhaler One puff at night for two weeks, and if okay then stop Qvar  1 Inhaler  2  . calcium carbonate (OS-CAL) 600 MG TABS Take 600 mg by mouth daily.       . Cholecalciferol (VITAMIN D3) 1000 UNITS CAPS Take 4 capsules by mouth daily.        . fish oil-omega-3 fatty acids 1000 MG capsule Take 2 g by mouth 2 (two) times daily.        . furosemide (LASIX) 40 MG tablet Take 20 mg by mouth daily.      . metFORMIN (GLUCOPHAGE) 500 MG tablet Take 500 mg by mouth 2 (two) times daily with a meal.      . potassium chloride SA (K-DUR,KLOR-CON) 20 MEQ tablet Take 2 tablets (40 mEq total) by mouth daily.  180 tablet  3  . trandolapril (MAVIK) 4 MG tablet Take 8 mg by mouth daily.        Marland Kitchen venlafaxine (EFFEXOR) 37.5 MG tablet Take 37.5 mg by mouth daily.        Marland Kitchen warfarin (COUMADIN) 5 MG tablet Take 5 mg by mouth daily. Take 5 mg on mon, wed, fri. Take 2.5 mg the rest of the week.        History   Social History  . Marital Status: Married    Spouse Name: N/A    Number of Children: N/A  . 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     Social History Main Topics  . Smoking status: Never Smoker   . Smokeless tobacco: Never Used  . Alcohol Use: No  . Drug Use: No  . Sexually Active: Not on file   Other Topics Concern  . Not on file   Social History Narrative   Does not et regular exerciseDaily caffeine use    Family History  Problem Relation Age of Onset  . Cancer Maternal Grandfather   . Heart disease Father   . Heart disease Sister   . Diabetes Mother   . Diabetes Father   . Diabetes Sister   . Diabetes Paternal Grandfather   . Diabetes Paternal Grandmother   . Heart disease Paternal Uncle     Past Medical History  Diagnosis Date  . DM (diabetes mellitus)   . Atrial fibrillation     Treated with maze procedure / recurrent atrial fibrillation  February, 2011... TEE cardioversion October 19, 2009  . Atrial tachycardia     February, 2011... Tikosyn started in hospital  . Bradycardia     Post cardioversion February, 2011,/digoxin/Cardizem metoprolol stopped, may need pacemaker  . Mitral regurgitation     Treated with repair, atrial appendage was removed or tied off at surgery.. this was proven by TEE February, 2011  . Pulmonary HTN     55 mmHg, echo, February, 2011 / no mention of pulmonary hypertension echo, June, 2011  . Right ventricular dysfunction     Mild to moderate, echo, February, 2011 / normalized echo, June, 2011  . TR (tricuspid regurgitation)     Moderate, echo, February, 2011 / trivial, echo, June, 2011  . Mitral stenosis     Mild, February, 2011, post mitral valve repair / mild, echo, June, 2011  . CAD (coronary artery disease)     LIMA to the LAD at time of mitral valve repair / LIMA atretic,, February, 2011  . Ejection fraction < 50%     EF 30% echo and TEE diagnosis February, 2011, possibly rate related / no contraction by catheter, bradycardia so carvedilol cannot be used / EF 55-60% echo, June, 2011 / EF 50%, echo, February, 2012    . HTN (hypertension)   . Hemorrhoids, internal   . Diverticulosis of colon   . Colon polyp, hyperplastic   . Environmental allergies   . atrial appendage     Removed were tied off at surgery, proven by TEE February, 2011  . S/P mitral valve repair     [facility] / Maze procedure/ atrial appendage removed were tied off  . Hx of CABG     At time of mitral valve repair  . Rash     ?Vicodin, Lotensin, Toprol  . CHF (congestive heart failure)     Mild in hospital February, 2011  . Drug therapy     Tikosyn,  stopped 02/10/2012  . Shortness of breath     Evaluated by Dr. Craige Cotta. Mat [id number].... bronchodilator response,,  medication started  /  patient much improved June 14, 201 2  . QT prolongation     Tikosyn and Effexor. QT prolonged October 13, 2011, peak is in dose reduced from 500  to -250 twice a day  . H/O amiodarone therapy     Satrted 02/12/2012    Past Surgical History  Procedure Date  . Atrial appendage removed   . Mitral valve repair   . Coronary artery bypass graft   . Maze   . Vaginal hysterectomy   . Cholecystectomy   . Breast lumpectomy     x 2  . Cardioversion 02/08/2012    Procedure: CARDIOVERSION;  Surgeon: [name], MD;  Location: [facility] OR;  Service: Cardiovascular;  Laterality: N/A;    ROS   Patient denies fever, chills, headache, sweats, rash, change in vision, change in hearing, chest pain, cough, nausea vomiting, urinary symptoms. All other systems are reviewed and are negative.  PHYSICAL EXAM  Patient is oriented to person time and place. Affect is normal. She's here with her husband. There is no jugulovenous distention. Lungs are clear. Respiratory effort is nonlabored. Cardiac exam reveals S1 and S2. There no clicks or significant murmurs. The abdomen is soft. There is no peripheral edema.  Filed Vitals:   02/20/12 1559  BP: 130/70  Pulse: 98  Height: 5\' 3"  (1.6 m)  Weight: 170 lb (77.111 kg)   EKG is done today and reviewed  by me. She is in  atrial fibrillation. The rate at rest is 95.  ASSESSMENT & PLAN

## 2012-02-20 NOTE — Patient Instructions (Addendum)
Your physician recommends that you schedule a follow-up appointment in:  Next week

## 2012-02-23 ENCOUNTER — Telehealth: Payer: Self-pay | Admitting: Cardiology

## 2012-02-23 NOTE — Telephone Encounter (Signed)
Debby L gave me a [employer] paper she Received Via Fax, Dr.Katz signed & Completed, Pt is aware sone, She will come Pick Up Next Week   02/23/12/KM

## 2012-02-28 ENCOUNTER — Ambulatory Visit: Payer: BC Managed Care – PPO | Admitting: Cardiology

## 2012-02-28 ENCOUNTER — Encounter: Payer: Self-pay | Admitting: Cardiology

## 2012-02-28 ENCOUNTER — Encounter (HOSPITAL_COMMUNITY): Payer: Self-pay | Admitting: Pharmacy Technician

## 2012-02-28 ENCOUNTER — Ambulatory Visit (INDEPENDENT_AMBULATORY_CARE_PROVIDER_SITE_OTHER): Payer: BC Managed Care – PPO | Admitting: Cardiology

## 2012-02-28 ENCOUNTER — Ambulatory Visit (INDEPENDENT_AMBULATORY_CARE_PROVIDER_SITE_OTHER): Payer: BC Managed Care – PPO | Admitting: *Deleted

## 2012-02-28 VITALS — BP 120/72 | HR 95 | Ht 63.0 in | Wt 172.8 lb

## 2012-02-28 DIAGNOSIS — I4891 Unspecified atrial fibrillation: Secondary | ICD-10-CM

## 2012-02-28 DIAGNOSIS — Z8679 Personal history of other diseases of the circulatory system: Secondary | ICD-10-CM

## 2012-02-28 DIAGNOSIS — Z01812 Encounter for preprocedural laboratory examination: Secondary | ICD-10-CM

## 2012-02-28 DIAGNOSIS — Z7901 Long term (current) use of anticoagulants: Secondary | ICD-10-CM

## 2012-02-28 LAB — CBC WITH DIFFERENTIAL/PLATELET
Eosinophils Relative: 1.1 % (ref 0.0–5.0)
HCT: 41.6 % (ref 36.0–46.0)
Hemoglobin: 13.4 g/dL (ref 12.0–15.0)
Lymphs Abs: 1.7 10*3/uL (ref 0.7–4.0)
Monocytes Relative: 10.9 % (ref 3.0–12.0)
Neutro Abs: 3.6 10*3/uL (ref 1.4–7.7)
WBC: 6.2 10*3/uL (ref 4.5–10.5)

## 2012-02-28 LAB — BASIC METABOLIC PANEL
GFR: 79.48 mL/min (ref >60.00)
Potassium: 4.1 mEq/L (ref 3.5–5.1)
Sodium: 140 mEq/L (ref 135–145)

## 2012-02-28 LAB — POCT INR: INR: 2.3

## 2012-02-28 NOTE — Patient Instructions (Addendum)
Your physician recommends that you return for lab work in: today (cbc, bmet)  Your cardioversion is scheduled for tomorrow, 02/29/12 at 9:00am.  Please arrive around 7-7:30am.  See letter for details.

## 2012-02-28 NOTE — H&P (Signed)
[name], MD  02/28/2012  2:06 PM  Pended    HPI  Patient is seen forAtrial fibrillation. I saw her last February 20, 2012 we continued her amiodarone load. She continued taking 200 mg twice a day. Also since then she's had several days of 600 mg during the day. I've not use a higher dose because we have been concerned about bradycardia. Today she returns and her rate is controlled. She's feeling well.    Allergies   Allergen  Reactions   .  Meperidine Hcl     .  Morphine     .  Tetanus Toxoid         Current Outpatient Prescriptions   Medication  Sig  Dispense  Refill   .  acetaminophen (TYLENOL) 500 MG tablet  Take 1,000 mg by mouth every 6 (six) hours as needed. For pain         .  albuterol (PROVENTIL HFA;VENTOLIN HFA) 108 (90 BASE) MCG/ACT inhaler  Inhale 2 puffs into the lungs every 6 (six) hours as needed. For shortness of breath         .  amiodarone (PACERONE) 200 MG tablet  Take 200 mg by mouth 3 (three) times daily.          Marland Kitchen  aspirin 81 MG tablet  Take 81 mg by mouth daily.           Marland Kitchen  atorvastatin (LIPITOR) 40 MG tablet  Take 40 mg by mouth daily.         .  calcium carbonate (OS-CAL) 600 MG TABS  Take 600 mg by mouth daily.          .  Cholecalciferol (VITAMIN D3) 1000 UNITS CAPS  Take 4 capsules by mouth daily.           .  fish oil-omega-3 fatty acids 1000 MG capsule  Take 2 g by mouth 2 (two) times daily.           .  furosemide (LASIX) 40 MG tablet  Take 20 mg by mouth daily.         .  metFORMIN (GLUCOPHAGE) 500 MG tablet  Take 500 mg by mouth 2 (two) times daily with a meal.         .  potassium chloride SA (K-DUR,KLOR-CON) 20 MEQ tablet  Take 2 tablets (40 mEq total) by mouth daily.   180 tablet   3   .  trandolapril (MAVIK) 4 MG tablet  Take 8 mg by mouth daily.           Marland Kitchen  venlafaxine (EFFEXOR) 37.5 MG tablet  Take 37.5 mg by mouth daily.           Marland Kitchen  warfarin (COUMADIN) 5 MG tablet  Take 5 mg by mouth daily. Take 5 mg on mon, wed, fri. Take 2.5 mg the  rest of the week.             History       Social History   .  Marital Status:  Married       Spouse Name:  N/A       Number of Children:  N/A   .  Years of Education:  N/A       Occupational History   .  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  Social History Main Topics   .  Smoking status:  Never Smoker    .  Smokeless tobacco:  Never Used   .  Alcohol Use:  No   .  Drug Use:  No   .  Sexually Active:  Not on file       Other Topics  Concern   .  Not on file       Social History Narrative     Does not et regular exerciseDaily caffeine use       Family History   Problem  Relation  Age of Onset   .  Cancer  Maternal Grandfather     .  Heart disease  Father     .  Heart disease  Sister     .  Diabetes  Mother     .  Diabetes  Father     .  Diabetes  Sister     .  Diabetes  Paternal Grandfather     .  Diabetes  Paternal Grandmother     .  Heart disease  Paternal Uncle         Past Medical History   Diagnosis  Date   .  DM (diabetes mellitus)     .  Atrial fibrillation         Treated with maze procedure / recurrent atrial fibrillation February, 2011... TEE cardioversion October 19, 2009   .  Atrial tachycardia         February, 2011... Tikosyn started in hospital   .  Bradycardia         Post cardioversion February, 2011,/digoxin/Cardizem metoprolol stopped, may need pacemaker   .  Mitral regurgitation         Treated with repair, atrial appendage was removed or tied off at surgery.. this was proven by TEE February, 2011   .  Pulmonary HTN         55 mmHg, echo, February, 2011 / no mention of pulmonary hypertension echo, June, 2011   .  Right ventricular dysfunction         Mild to moderate, echo, February, 2011 / normalized echo, June, 2011   .  TR (tricuspid regurgitation)         Moderate, echo, February, 2011 / trivial, echo, June, 2011   .  Mitral stenosis         Mild, February, 2011, post mitral valve repair / mild, echo, June, 2011     .  CAD (coronary artery disease)         LIMA to the LAD at time of mitral valve repair / LIMA atretic,, February, 2011   .  Ejection fraction < 50%         EF 30% echo and TEE diagnosis February, 2011, possibly rate related / no contraction by catheter, bradycardia so carvedilol cannot be used / EF 55-60% echo, June, 2011 / EF 50%, echo, February, 2012   .  HTN (hypertension)     .  Hemorrhoids, internal     .  Diverticulosis of colon     .  Colon polyp, hyperplastic     .  Environmental allergies     .  atrial appendage         Removed were tied off at surgery, proven by TEE February, 2011   .  S/P mitral valve repair         [facility] / Maze procedure/ atrial appendage removed were tied off   .  Hx of CABG         At time of mitral valve repair   .  Rash         ?Vicodin, Lotensin, Toprol   .  CHF (congestive heart failure)         Mild in hospital February, 2011   .  Drug therapy         Tikosyn,  stopped 02/10/2012   .  Shortness of breath         Evaluated by Dr. Craige Cotta. Mat [id number].... bronchodilator response,,  medication started  /  patient much improved June 14, 201 2   .  QT prolongation         Tikosyn and Effexor. QT prolonged October 13, 2011, peak is in dose reduced from 500  to -250 twice a day   .  H/O amiodarone therapy         Satrted 02/12/2012       Past Surgical History   Procedure  Date   .  Atrial appendage removed     .  Mitral valve repair     .  Coronary artery bypass graft     .  Maze     .  Vaginal hysterectomy     .  Cholecystectomy     .  Breast lumpectomy         x 2   .  Cardioversion  02/08/2012       Procedure: CARDIOVERSION;  Surgeon: [name], MD;  Location: [facility] OR;  Service: Cardiovascular;  Laterality: N/A;      ROS    Patient denies fever, chills, headache, sweats, rash, change in vision, change in hearing, chest pain, cough, nausea vomiting, urinary symptoms. All other systems are reviewed and are negative.   PHYSICAL EXAM   Patient is oriented to person time and place. Affect is normal. There is no jugulovenous distention. Lungs are clear. Respiratory effort is nonlabored. Cardiac exam reveals S1 and S2. There no clicks or significant murmurs. The rhythm is irregularly irregular. Abdomen is soft. There is no peripheral edema.    Filed Vitals:     02/28/12 1343   BP:  120/72   Pulse:  95   Height:  5\' 3"  (1.6 m)   Weight:  172 lb 12.8 oz (78.382 kg)    EKG is done today and reviewed by me. She is atrial fibrillation. The rate is controlled.   ASSESSMENT & PLAN        Atrial fibrillation - [name], MD  02/28/2012  2:09 PM  Addendum The patient has now been on amiodarone 400 mg for several weeks. She also had several days of 600 mg during the day. Her rate is controlled. She feels better but not back to baseline. We will proceed with cardioversion tomorrow. She has been very carefully anticoagulated. She knows that there is some chance that she may have marked bradycardia when we do the conversion. If so she will stay in the hospital and have a pacemaker placed.   During her visit today we made phone calls to arrange the timing of the cardioversion. I am also writing her orders for cardioversion in making sure that the H&P is available on the cardioversion note.

## 2012-02-28 NOTE — Progress Notes (Signed)
HPI  Patient is seen forAtrial fibrillation. I saw her last February 20, 2012 we continued her amiodarone load. She continued taking 200 mg twice a day. Also since then she's had several days of 600 mg during the day. I've not use a higher dose because we have been concerned about bradycardia. Today she returns and her rate is controlled. She's feeling well.  Allergies  Allergen Reactions  . Meperidine Hcl   . Morphine   . Tetanus Toxoid     Current Outpatient Prescriptions  Medication Sig Dispense Refill  . acetaminophen (TYLENOL) 500 MG tablet Take 1,000 mg by mouth every 6 (six) hours as needed. For pain      . albuterol (PROVENTIL HFA;VENTOLIN HFA) 108 (90 BASE) MCG/ACT inhaler Inhale 2 puffs into the lungs every 6 (six) hours as needed. For shortness of breath      . amiodarone (PACERONE) 200 MG tablet Take 200 mg by mouth 3 (three) times daily.       Marland Kitchen aspirin 81 MG tablet Take 81 mg by mouth daily.        Marland Kitchen atorvastatin (LIPITOR) 40 MG tablet Take 40 mg by mouth daily.      . calcium carbonate (OS-CAL) 600 MG TABS Take 600 mg by mouth daily.       . Cholecalciferol (VITAMIN D3) 1000 UNITS CAPS Take 4 capsules by mouth daily.        . fish oil-omega-3 fatty acids 1000 MG capsule Take 2 g by mouth 2 (two) times daily.        . furosemide (LASIX) 40 MG tablet Take 20 mg by mouth daily.      . metFORMIN (GLUCOPHAGE) 500 MG tablet Take 500 mg by mouth 2 (two) times daily with a meal.      . potassium chloride SA (K-DUR,KLOR-CON) 20 MEQ tablet Take 2 tablets (40 mEq total) by mouth daily.  180 tablet  3  . trandolapril (MAVIK) 4 MG tablet Take 8 mg by mouth daily.        Marland Kitchen venlafaxine (EFFEXOR) 37.5 MG tablet Take 37.5 mg by mouth daily.        Marland Kitchen warfarin (COUMADIN) 5 MG tablet Take 5 mg by mouth daily. Take 5 mg on mon, wed, fri. Take 2.5 mg the rest of the week.        History   Social History  . Marital Status: Married    Spouse Name: N/A    Number of Children: N/A  . 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     Social History Main Topics  . Smoking status: Never Smoker   . Smokeless tobacco: Never Used  . Alcohol Use: No  . Drug Use: No  . Sexually Active: Not on file   Other Topics Concern  . Not on file   Social History Narrative   Does not et regular exerciseDaily caffeine use    Family History  Problem Relation Age of Onset  . Cancer Maternal Grandfather   . Heart disease Father   . Heart disease Sister   . Diabetes Mother   . Diabetes Father   . Diabetes Sister   . Diabetes Paternal Grandfather   . Diabetes Paternal Grandmother   . Heart disease Paternal Uncle     Past Medical History  Diagnosis Date  . DM (diabetes mellitus)   . Atrial fibrillation     Treated  with maze procedure / recurrent atrial fibrillation February, 2011... TEE cardioversion October 19, 2009  . Atrial tachycardia     February, 2011... Tikosyn started in hospital  . Bradycardia     Post cardioversion February, 2011,/digoxin/Cardizem metoprolol stopped, may need pacemaker  . Mitral regurgitation     Treated with repair, atrial appendage was removed or tied off at surgery.. this was proven by TEE February, 2011  . Pulmonary HTN     55 mmHg, echo, February, 2011 / no mention of pulmonary hypertension echo, June, 2011  . Right ventricular dysfunction     Mild to moderate, echo, February, 2011 / normalized echo, June, 2011  . TR (tricuspid regurgitation)     Moderate, echo, February, 2011 / trivial, echo, June, 2011  . Mitral stenosis     Mild, February, 2011, post mitral valve repair / mild, echo, June, 2011  . CAD (coronary artery disease)     LIMA to the LAD at time of mitral valve repair / LIMA atretic,, February, 2011  . Ejection fraction < 50%     EF 30% echo and TEE diagnosis February, 2011, possibly rate related / no contraction by catheter, bradycardia so carvedilol cannot be used / EF 55-60% echo, June, 2011  / EF 50%, echo, February, 2012  . HTN (hypertension)   . Hemorrhoids, internal   . Diverticulosis of colon   . Colon polyp, hyperplastic   . Environmental allergies   . atrial appendage     Removed were tied off at surgery, proven by TEE February, 2011  . S/P mitral valve repair     [facility] / Maze procedure/ atrial appendage removed were tied off  . Hx of CABG     At time of mitral valve repair  . Rash     ?Vicodin, Lotensin, Toprol  . CHF (congestive heart failure)     Mild in hospital February, 2011  . Drug therapy     Tikosyn,  stopped 02/10/2012  . Shortness of breath     Evaluated by Dr. Craige Cotta. Mat [id number].... bronchodilator response,,  medication started  /  patient much improved June 14, 201 2  . QT prolongation     Tikosyn and Effexor. QT prolonged October 13, 2011, peak is in dose reduced from 500  to -250 twice a day  . H/O amiodarone therapy     Satrted 02/12/2012    Past Surgical History  Procedure Date  . Atrial appendage removed   . Mitral valve repair   . Coronary artery bypass graft   . Maze   . Vaginal hysterectomy   . Cholecystectomy   . Breast lumpectomy     x 2  . Cardioversion 02/08/2012    Procedure: CARDIOVERSION;  Surgeon: [name], MD;  Location: [facility] OR;  Service: Cardiovascular;  Laterality: N/A;    ROS   Patient denies fever, chills, headache, sweats, rash, change in vision, change in hearing, chest pain, cough, nausea vomiting, urinary symptoms. All other systems are reviewed and are negative.  PHYSICAL EXAM  Patient is oriented to person time and place. Affect is normal. There is no jugulovenous distention. Lungs are clear. Respiratory effort is nonlabored. Cardiac exam reveals S1 and S2. There no clicks or significant murmurs. The rhythm is irregularly irregular. Abdomen is soft. There is no peripheral edema.  Filed Vitals:   02/28/12 1343  BP: 120/72  Pulse: 95  Height: 5\' 3"  (1.6 m)  Weight: 172 lb 12.8 oz (78.382 kg)  EKG is  done today and reviewed by me. She is atrial fibrillation. The rate is controlled.  ASSESSMENT & PLAN

## 2012-02-28 NOTE — Assessment & Plan Note (Addendum)
The patient has now been on amiodarone 400 mg for several weeks. She also had several days of 600 mg during the day. Her rate is controlled. She feels better but not back to baseline. We will proceed with cardioversion tomorrow. She has been very carefully anticoagulated. She knows that there is some chance that she may have marked bradycardia when we do the conversion. If so she will stay in the hospital and have a pacemaker placed.  During her visit today we made phone calls to arrange the timing of the cardioversion. I am also writing her orders for cardioversion in making sure that the H&P is available on the cardioversion note.

## 2012-02-29 ENCOUNTER — Encounter (HOSPITAL_COMMUNITY)
Admission: RE | Disposition: A | Discharge status: Home / Self Care | Payer: Self-pay | Source: Ambulatory Visit | Attending: Cardiology

## 2012-02-29 ENCOUNTER — Encounter (HOSPITAL_COMMUNITY): Payer: Self-pay | Admitting: Certified Registered Nurse Anesthetist

## 2012-02-29 ENCOUNTER — Ambulatory Visit (HOSPITAL_COMMUNITY): Payer: BC Managed Care – PPO | Admitting: Certified Registered Nurse Anesthetist

## 2012-02-29 ENCOUNTER — Ambulatory Visit (HOSPITAL_COMMUNITY)
Admission: RE | Admit: 2012-02-29 | Discharge: 2012-02-29 | Disposition: A | Discharge status: Home / Self Care | Payer: BC Managed Care – PPO | Source: Ambulatory Visit | Attending: Cardiology | Admitting: Cardiology

## 2012-02-29 ENCOUNTER — Telehealth: Payer: Self-pay | Admitting: Cardiology

## 2012-02-29 DIAGNOSIS — I4891 Unspecified atrial fibrillation: Secondary | ICD-10-CM | POA: Insufficient documentation

## 2012-02-29 HISTORY — PX: CARDIOVERSION: SHX1299

## 2012-02-29 LAB — GLUCOSE, CAPILLARY: Glucose-Capillary: 126 mg/dL — ABNORMAL HIGH (ref 70–99)

## 2012-02-29 SURGERY — CARDIOVERSION
Anesthesia: General | Wound class: Clean

## 2012-02-29 MED ORDER — SODIUM CHLORIDE 0.9 % IJ SOLN: 3.0000 mL | INTRAMUSCULAR | Status: DC | PRN

## 2012-02-29 MED ORDER — SODIUM CHLORIDE 0.9 % IJ SOLN: 3.0000 mL | Freq: Two times a day (BID) | INTRAMUSCULAR | Status: DC

## 2012-02-29 MED ORDER — HYDROCORTISONE 1 % EX CREA: 1.0000 "application " | TOPICAL_CREAM | Freq: Three times a day (TID) | CUTANEOUS | Status: DC | PRN

## 2012-02-29 MED ORDER — PROPOFOL 10 MG/ML IV BOLUS
INTRAVENOUS | Status: DC | PRN
  Administered 2012-02-29: 60 mg via INTRAVENOUS

## 2012-02-29 MED ORDER — LIDOCAINE HCL (CARDIAC) 20 MG/ML IV SOLN
INTRAVENOUS | Status: DC | PRN
  Administered 2012-02-29: 40 mg via INTRAVENOUS

## 2012-02-29 MED ORDER — SODIUM CHLORIDE 0.9 % IV SOLN
INTRAVENOUS | Status: DC | PRN
  Administered 2012-02-29: 08:00:00 via INTRAVENOUS

## 2012-02-29 MED ORDER — SODIUM CHLORIDE 0.9 % IV SOLN
INTRAVENOUS | Status: DC
  Administered 2012-02-29: 08:00:00 via INTRAVENOUS

## 2012-02-29 MED ORDER — SODIUM CHLORIDE 0.9 % IV SOLN: 250.0000 mL | INTRAVENOUS | Status: DC

## 2012-02-29 NOTE — Preoperative (Signed)
Beta Blockers   Reason not to administer Beta Blockers:Not Applicable 

## 2012-02-29 NOTE — Telephone Encounter (Signed)
Ok for pt to return to work per Dr Myrtis Ser.  She was notified and will return on Monday.

## 2012-02-29 NOTE — CV Procedure (Signed)
   The patient has atrial fibrillation. H&P is in this procedure documentation. The patient had been cardioverted several weeks ago. She did not hold sinus rhythm. She has been very carefully loaded with amiodarone over several weeks as an outpatient. Her anticoagulation with Coumadin has been therapeutic.  The patient was well informed about her procedure. She signed the appropriate consent.  Anesthesia was present. Patient received 40 mg of IV lidocaine and 60 mg of IV propofol.  Anterior posterior pads were in place with a biphasic defibrillator. The patient received one shock with 200 J of energy. She immediately converted to sinus rhythm with mild sinus bradycardia with a rate of 55. There were no complications with the procedure. She is quite stable.  Patient will return home with her husband after she is observed for another hour. She will remain on amiodarone 400 mg daily until I see her back in the office. We will contact her for the office visit.  Jerral Bonito, MD

## 2012-02-29 NOTE — Telephone Encounter (Signed)
New msg Pt has question about when she is to return to work. Please call

## 2012-02-29 NOTE — Anesthesia Preprocedure Evaluation (Addendum)
Anesthesia Evaluation  Patient identified by MRN, date of birth, ID band Patient awake    Reviewed: Allergy & Precautions, H&P , NPO status , Patient's Chart, lab work & pertinent test results  Airway Mallampati: III TM Distance: >3 FB     Dental  (+) Teeth Intact and Dental Advisory Given   Pulmonary asthma ,    Pulmonary exam normal       Cardiovascular Exercise Tolerance: Poor hypertension, + CAD and +CHF + dysrhythmias Atrial Fibrillation Rhythm:Irregular Rate:Abnormal     Neuro/Psych Anxiety negative neurological ROS     GI/Hepatic negative GI ROS, Neg liver ROS,   Endo/Other  Diabetes mellitus-, Type 2, Oral Hypoglycemic Agents  Renal/GU negative Renal ROS     Musculoskeletal negative musculoskeletal ROS (+) Arthritis -, Osteoarthritis,    Abdominal Normal abdominal exam  (+)   Peds  Hematology negative hematology ROS (+)   Anesthesia Other Findings Teeth appear to have veneers or some dental work on them.  Crowded mouth.  Reproductive/Obstetrics                          Anesthesia Physical Anesthesia Plan  ASA: III  Anesthesia Plan: General   Post-op Pain Management:    Induction: Intravenous  Airway Management Planned: Simple Face Mask  Additional Equipment:   Intra-op Plan:   Post-operative Plan:   Informed Consent: I have reviewed the patients History and Physical, chart, labs and discussed the procedure including the risks, benefits and alternatives for the proposed anesthesia with the patient or authorized representative who has indicated his/her understanding and acceptance.   Dental advisory given  Plan Discussed with: CRNA  Anesthesia Plan Comments:         Anesthesia Quick Evaluation

## 2012-02-29 NOTE — Transfer of Care (Signed)
Immediate Anesthesia Transfer of Care Note  Patient: Anita Duke  Procedure(s) Performed: Procedure(s) (LRB): CARDIOVERSION (N/A)  Patient Location: PACU  Anesthesia Type: General  Level of Consciousness: awake, alert , oriented and patient cooperative  Airway & Oxygen Therapy: Patient Spontanous Breathing and Patient connected to nasal cannula oxygen  Post-op Assessment: Report given to PACU RN and Post -op Vital signs reviewed and stable  Post vital signs: Reviewed and stable  Complications: No apparent anesthesia complications

## 2012-02-29 NOTE — Progress Notes (Signed)
Discharged home; monitor NSR, rate 60's; no c/o; office to call pt for follow up appointment

## 2012-02-29 NOTE — Anesthesia Postprocedure Evaluation (Signed)
  Anesthesia Post-op Note  Patient: Anita Duke  Procedure(s) Performed: Procedure(s) (LRB): CARDIOVERSION (N/A)  Patient Location: PACU and Short Stay  Anesthesia Type: General  Level of Consciousness: awake, alert , oriented and patient cooperative  Airway and Oxygen Therapy: Patient Spontanous Breathing and Patient connected to nasal cannula oxygen  Post-op Pain: none  Post-op Assessment: Post-op Vital signs reviewed and Patient's Cardiovascular Status Stable  Post-op Vital Signs: Reviewed and stable  Complications: No apparent anesthesia complications

## 2012-03-01 ENCOUNTER — Encounter (HOSPITAL_COMMUNITY): Payer: Self-pay | Admitting: Cardiology

## 2012-03-11 ENCOUNTER — Ambulatory Visit: Payer: BC Managed Care – PPO | Admitting: Cardiology

## 2012-03-11 ENCOUNTER — Ambulatory Visit (INDEPENDENT_AMBULATORY_CARE_PROVIDER_SITE_OTHER): Payer: BC Managed Care – PPO | Admitting: Cardiology

## 2012-03-11 ENCOUNTER — Ambulatory Visit (INDEPENDENT_AMBULATORY_CARE_PROVIDER_SITE_OTHER): Payer: BC Managed Care – PPO

## 2012-03-11 ENCOUNTER — Telehealth: Payer: Self-pay | Admitting: Cardiology

## 2012-03-11 ENCOUNTER — Encounter: Payer: Self-pay | Admitting: Cardiology

## 2012-03-11 VITALS — BP 146/64 | HR 64 | Ht 63.0 in | Wt 175.0 lb

## 2012-03-11 DIAGNOSIS — Z7901 Long term (current) use of anticoagulants: Secondary | ICD-10-CM

## 2012-03-11 DIAGNOSIS — Z8679 Personal history of other diseases of the circulatory system: Secondary | ICD-10-CM

## 2012-03-11 DIAGNOSIS — I4891 Unspecified atrial fibrillation: Secondary | ICD-10-CM

## 2012-03-11 MED ORDER — WARFARIN SODIUM 5 MG PO TABS: ORAL_TABLET | ORAL | Status: DC

## 2012-03-11 MED ORDER — AMIODARONE HCL 200 MG PO TABS: ORAL_TABLET | ORAL | Status: DC

## 2012-03-11 NOTE — Patient Instructions (Addendum)
Your physician recommends that you schedule a follow-up appointment in: 4 weeks  Your physician has recommended you make the following change in your medication: DECREASE your Amiodarone to 300mg  daily for 2 weeks and then 200mg  daily

## 2012-03-11 NOTE — Telephone Encounter (Signed)
Please return call to [facility] [phone] regarding possible med interaction

## 2012-03-11 NOTE — Assessment & Plan Note (Signed)
It appears that she is holding sinus rhythm. Amiodarone dose will be decreased from 400 mg to 300 mg daily for 2 weeks and then 200 mg. I see her back in the 4 week range.

## 2012-03-11 NOTE — Telephone Encounter (Signed)
[facility] called to verify that Dr Myrtis Ser is aware that there is a drug interaction between the amiodarone and the warfarin.  I notified them that Dr Myrtis Ser is aware and that she is being monitored.

## 2012-03-11 NOTE — Progress Notes (Signed)
HPI Patient is seen for followup atrial fibrillation we decided to proceed with a followup cardioversion after she had been loaded well with amiodarone. This was done and she converted easily to sinus rhythm. She says she feels great.  Allergies  Allergen Reactions  . Meperidine Hcl   . Morphine   . Tetanus Toxoid     Current Outpatient Prescriptions  Medication Sig Dispense Refill  . acetaminophen (TYLENOL) 500 MG tablet Take 1,000 mg by mouth every 6 (six) hours as needed. For pain      . albuterol (PROVENTIL HFA;VENTOLIN HFA) 108 (90 BASE) MCG/ACT inhaler Inhale 2 puffs into the lungs every 6 (six) hours as needed. For shortness of breath      . amiodarone (PACERONE) 200 MG tablet Take 200 mg by mouth 2 (two) times daily.       Marland Kitchen aspirin 81 MG tablet Take 81 mg by mouth daily.        Marland Kitchen atorvastatin (LIPITOR) 40 MG tablet Take 40 mg by mouth daily.      . calcium carbonate (OS-CAL) 600 MG TABS Take 600 mg by mouth daily.       . cholecalciferol (VITAMIN D) 1000 UNITS tablet Take 4,000 Units by mouth daily.      . fish oil-omega-3 fatty acids 1000 MG capsule Take 2 g by mouth 2 (two) times daily.        . furosemide (LASIX) 40 MG tablet Take 20 mg by mouth daily.      . metFORMIN (GLUCOPHAGE) 500 MG tablet Take 500 mg by mouth 2 (two) times daily with a meal.      . potassium chloride SA (K-DUR,KLOR-CON) 20 MEQ tablet Take 2 tablets (40 mEq total) by mouth daily.  180 tablet  3  . trandolapril (MAVIK) 4 MG tablet Take 8 mg by mouth daily.       Marland Kitchen venlafaxine (EFFEXOR) 37.5 MG tablet Take 37.5 mg by mouth daily.       Marland Kitchen warfarin (COUMADIN) 5 MG tablet Take as directed by anticoagulation clinic  30 tablet  3    History   Social History  . Marital Status: Married    Spouse Name: N/A    Number of Children: N/A  . 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     Social History Main Topics  . Smoking status: Never Smoker   . Smokeless  tobacco: Never Used  . Alcohol Use: No  . Drug Use: No  . Sexually Active: Not on file   Other Topics Concern  . Not on file   Social History Narrative   Does not et regular exerciseDaily caffeine use    Family History  Problem Relation Age of Onset  . Cancer Maternal Grandfather   . Heart disease Father   . Heart disease Sister   . Diabetes Mother   . Diabetes Father   . Diabetes Sister   . Diabetes Paternal Grandfather   . Diabetes Paternal Grandmother   . Heart disease Paternal Uncle     Past Medical History  Diagnosis Date  . DM (diabetes mellitus)   . Atrial fibrillation     Treated with maze procedure / recurrent atrial fibrillation February, 2011... TEE cardioversion October 19, 2009  . Atrial tachycardia     February, 2011... Tikosyn started in hospital  . Bradycardia     Post cardioversion February, 2011,/digoxin/Cardizem metoprolol stopped, may need  pacemaker  . Mitral regurgitation     Treated with repair, atrial appendage was removed or tied off at surgery.. this was proven by TEE February, 2011  . Pulmonary HTN     55 mmHg, echo, February, 2011 / no mention of pulmonary hypertension echo, June, 2011  . Right ventricular dysfunction     Mild to moderate, echo, February, 2011 / normalized echo, June, 2011  . TR (tricuspid regurgitation)     Moderate, echo, February, 2011 / trivial, echo, June, 2011  . Mitral stenosis     Mild, February, 2011, post mitral valve repair / mild, echo, June, 2011  . CAD (coronary artery disease)     LIMA to the LAD at time of mitral valve repair / LIMA atretic,, February, 2011  . Ejection fraction < 50%     EF 30% echo and TEE diagnosis February, 2011, possibly rate related / no contraction by catheter, bradycardia so carvedilol cannot be used / EF 55-60% echo, June, 2011 / EF 50%, echo, February, 2012  . HTN (hypertension)   . Hemorrhoids, internal   . Diverticulosis of colon   . Colon polyp, hyperplastic   . Environmental  allergies   . atrial appendage     Removed were tied off at surgery, proven by TEE February, 2011  . S/P mitral valve repair     [facility] / Maze procedure/ atrial appendage removed were tied off  . Hx of CABG     At time of mitral valve repair  . Rash     ?Vicodin, Lotensin, Toprol  . CHF (congestive heart failure)     Mild in hospital February, 2011  . Drug therapy     Tikosyn,  stopped 02/10/2012  . Shortness of breath     Evaluated by Dr. Craige Cotta. Mat [id number].... bronchodilator response,,  medication started  /  patient much improved June 14, 201 2  . QT prolongation     Tikosyn and Effexor. QT prolonged October 13, 2011, peak is in dose reduced from 500  to -250 twice a day  . H/O amiodarone therapy     Satrted 02/12/2012    Past Surgical History  Procedure Date  . Atrial appendage removed   . Mitral valve repair   . Coronary artery bypass graft   . Maze   . Vaginal hysterectomy   . Cholecystectomy   . Breast lumpectomy     x 2  . Cardioversion 02/08/2012    Procedure: CARDIOVERSION;  Surgeon: [name], MD;  Location: [facility] OR;  Service: Cardiovascular;  Laterality: N/A;  . Cardioversion 02/29/2012    Procedure: CARDIOVERSION;  Surgeon: [name], MD;  Location: [facility] OR;  Service: Cardiovascular;  Laterality: N/A;    ROS  Patient denies fever, chills, headache, sweats, rash, change in vision, change in hearing, chest pain, cough, nausea vomiting, urinary symptoms. All other systems are reviewed and are negative.  PHYSICAL EXAM  Patient is oriented to person time and place. Affect is normal. Head is atraumatic. Lungs are clear. Respiratory effort is nonlabored. Cardiac exam reveals S1 and S2. There no clicks or significant murmurs. The abdomen is soft. Is no peripheral edema.  Filed Vitals:   03/11/12 1221  BP: 146/64  Pulse: 64  Height: 5\' 3"  (1.6 m)  Weight: 175 lb (79.379 kg)   EKG is done today and reviewed by me. The patient had a Maze procedure. It is very  difficult to see her P waves but I believe  that they are in fact present. Her rate is 62. I believe that this is her sinus rhythm with PACs.  ASSESSMENT & PLAN

## 2012-03-12 NOTE — Telephone Encounter (Signed)
Agree 

## 2012-03-21 ENCOUNTER — Ambulatory Visit: Payer: BC Managed Care – PPO | Admitting: Pulmonary Disease

## 2012-03-29 ENCOUNTER — Ambulatory Visit (INDEPENDENT_AMBULATORY_CARE_PROVIDER_SITE_OTHER): Payer: BC Managed Care – PPO | Admitting: Pharmacist

## 2012-03-29 DIAGNOSIS — Z8679 Personal history of other diseases of the circulatory system: Secondary | ICD-10-CM

## 2012-03-29 DIAGNOSIS — Z7901 Long term (current) use of anticoagulants: Secondary | ICD-10-CM

## 2012-03-29 DIAGNOSIS — I4891 Unspecified atrial fibrillation: Secondary | ICD-10-CM

## 2012-04-02 ENCOUNTER — Telehealth: Payer: Self-pay | Admitting: Cardiology

## 2012-04-02 NOTE — Telephone Encounter (Signed)
SPOKE WITH PT  PER [employer] HAVE NOT RECEIVED  FORMS  PT SPOKE WITH DEBBIE AND WAS TOLD HAD BEEN FAXED TO [place]  COMPANY STATES HAVE NOT RECEIVED   CALLED KIM IN MEDICAL RECORDS HAD NO RECORD OF  BEING  SENT HERE  TO BE DONE PT AWARE WILL FORWARD TO  DEBBIE LEFLER LPN  FOR REVIEW .Zack Seal

## 2012-04-02 NOTE — Telephone Encounter (Signed)
New Problem:    Patient called in wanting to know if her attending physicians statement from her period of disability had been faxed in to [phone].  Patient believes that the number is to [facility].  Please call back.

## 2012-04-03 NOTE — Telephone Encounter (Signed)
F/u  Patient calling to f/u on this matter.  She needs some answer as to when she will get her paperwork. She can be reached at [phone]

## 2012-04-03 NOTE — Telephone Encounter (Signed)
Mrs Mealor was notified that I had faxed the information back in June.  I will refax it when I get back to the [employer].

## 2012-04-05 NOTE — Telephone Encounter (Signed)
Copy of form was obtained from [facility] in Medical Records and was refaxed.

## 2012-04-05 NOTE — Telephone Encounter (Signed)
Mrs Sow was notified that form was refaxed

## 2012-04-11 ENCOUNTER — Ambulatory Visit (INDEPENDENT_AMBULATORY_CARE_PROVIDER_SITE_OTHER): Payer: BC Managed Care – PPO | Admitting: Cardiology

## 2012-04-11 ENCOUNTER — Encounter: Payer: Self-pay | Admitting: Cardiology

## 2012-04-11 ENCOUNTER — Ambulatory Visit (INDEPENDENT_AMBULATORY_CARE_PROVIDER_SITE_OTHER): Payer: BC Managed Care – PPO | Admitting: *Deleted

## 2012-04-11 ENCOUNTER — Ambulatory Visit: Payer: BC Managed Care – PPO | Admitting: Cardiology

## 2012-04-11 VITALS — BP 124/72 | HR 72 | Ht 63.0 in | Wt 177.8 lb

## 2012-04-11 DIAGNOSIS — Z09 Encounter for follow-up examination after completed treatment for conditions other than malignant neoplasm: Secondary | ICD-10-CM

## 2012-04-11 DIAGNOSIS — I4891 Unspecified atrial fibrillation: Secondary | ICD-10-CM

## 2012-04-11 DIAGNOSIS — Z8679 Personal history of other diseases of the circulatory system: Secondary | ICD-10-CM

## 2012-04-11 DIAGNOSIS — R001 Bradycardia, unspecified: Secondary | ICD-10-CM

## 2012-04-11 DIAGNOSIS — R0602 Shortness of breath: Secondary | ICD-10-CM

## 2012-04-11 DIAGNOSIS — Z9229 Personal history of other drug therapy: Secondary | ICD-10-CM

## 2012-04-11 DIAGNOSIS — I498 Other specified cardiac arrhythmias: Secondary | ICD-10-CM

## 2012-04-11 DIAGNOSIS — Z7901 Long term (current) use of anticoagulants: Secondary | ICD-10-CM

## 2012-04-11 LAB — POCT INR: INR: 3

## 2012-04-11 NOTE — Patient Instructions (Addendum)
Your physician recommends that you continue on your current medications as directed. Please refer to the Current Medication list given to you today.  Your physician recommends that you schedule a follow-up appointment in: 2 months 

## 2012-04-11 NOTE — Progress Notes (Signed)
HPI The patient continues to do very well. I am extremely pleased that we were able to change her to amiodarone without having excessive bradycardia. She is holding sinus rhythm. Her EKG is difficult to interpret. However she does have a very small P-wave that I can definitely see in lead 2. She's feeling well.  Allergies  Allergen Reactions  . Meperidine Hcl   . Morphine   . Tetanus Toxoid     Current Outpatient Prescriptions  Medication Sig Dispense Refill  . acetaminophen (TYLENOL) 500 MG tablet Take 1,000 mg by mouth every 6 (six) hours as needed. For pain      . albuterol (PROVENTIL HFA;VENTOLIN HFA) 108 (90 BASE) MCG/ACT inhaler Inhale 2 puffs into the lungs every 6 (six) hours as needed. For shortness of breath      . amiodarone (PACERONE) 200 MG tablet Take one and one-half tabs (300mg ) daily for 2 weeks and then decrease to 1 tablet (200mg ) daily.  45 tablet  11  . aspirin 81 MG tablet Take 81 mg by mouth daily.        Marland Kitchen atorvastatin (LIPITOR) 40 MG tablet Take 40 mg by mouth daily.      . calcium carbonate (OS-CAL) 600 MG TABS Take 600 mg by mouth daily.       . cholecalciferol (VITAMIN D) 1000 UNITS tablet Take 4,000 Units by mouth daily.      . fish oil-omega-3 fatty acids 1000 MG capsule Take 2 g by mouth 2 (two) times daily.        Marland Kitchen FREESTYLE LITE test strip 1 each by Other route as needed.       . furosemide (LASIX) 40 MG tablet Take 20 mg by mouth daily.      . metFORMIN (GLUCOPHAGE) 500 MG tablet Take 500 mg by mouth 2 (two) times daily with a meal.      . potassium chloride SA (K-DUR,KLOR-CON) 20 MEQ tablet Take 2 tablets (40 mEq total) by mouth daily.  180 tablet  3  . trandolapril (MAVIK) 4 MG tablet Take 8 mg by mouth daily.       Marland Kitchen venlafaxine (EFFEXOR) 37.5 MG tablet Take 37.5 mg by mouth daily.       Marland Kitchen warfarin (COUMADIN) 5 MG tablet Take as directed by anticoagulation clinic  30 tablet  3    History   Social History  . Marital Status: Married    Spouse  Name: N/A    Number of Children: N/A  . 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     Social History Main Topics  . Smoking status: Never Smoker   . Smokeless tobacco: Never Used  . Alcohol Use: No  . Drug Use: No  . Sexually Active: Not on file   Other Topics Concern  . Not on file   Social History Narrative   Does not et regular exerciseDaily caffeine use    Family History  Problem Relation Age of Onset  . Cancer Maternal Grandfather   . Heart disease Father   . Heart disease Sister   . Diabetes Mother   . Diabetes Father   . Diabetes Sister   . Diabetes Paternal Grandfather   . Diabetes Paternal Grandmother   . Heart disease Paternal Uncle     Past Medical History  Diagnosis Date  . DM (diabetes mellitus)   . Atrial fibrillation     Treated  with maze procedure / recurrent atrial fibrillation February, 2011... TEE cardioversion October 19, 2009  . Atrial tachycardia     February, 2011... Tikosyn started in hospital  . Bradycardia     Post cardioversion February, 2011,/digoxin/Cardizem metoprolol stopped, may need pacemaker  . Mitral regurgitation     Treated with repair, atrial appendage was removed or tied off at surgery.. this was proven by TEE February, 2011  . Pulmonary HTN     55 mmHg, echo, February, 2011 / no mention of pulmonary hypertension echo, June, 2011  . Right ventricular dysfunction     Mild to moderate, echo, February, 2011 / normalized echo, June, 2011  . TR (tricuspid regurgitation)     Moderate, echo, February, 2011 / trivial, echo, June, 2011  . Mitral stenosis     Mild, February, 2011, post mitral valve repair / mild, echo, June, 2011  . CAD (coronary artery disease)     LIMA to the LAD at time of mitral valve repair / LIMA atretic,, February, 2011  . Ejection fraction < 50%     EF 30% echo and TEE diagnosis February, 2011, possibly rate related / no contraction by catheter, bradycardia so  carvedilol cannot be used / EF 55-60% echo, June, 2011 / EF 50%, echo, February, 2012  . HTN (hypertension)   . Hemorrhoids, internal   . Diverticulosis of colon   . Colon polyp, hyperplastic   . Environmental allergies   . atrial appendage     Removed were tied off at surgery, proven by TEE February, 2011  . S/P mitral valve repair     [facility] / Maze procedure/ atrial appendage removed were tied off  . Hx of CABG     At time of mitral valve repair  . Rash     ?Vicodin, Lotensin, Toprol  . CHF (congestive heart failure)     Mild in hospital February, 2011  . Drug therapy     Tikosyn,  stopped 02/10/2012  . Shortness of breath     Evaluated by Dr. Craige Cotta. Mat [id number].... bronchodilator response,,  medication started  /  patient much improved June 14, 201 2  . QT prolongation     Tikosyn and Effexor. QT prolonged October 13, 2011, peak is in dose reduced from 500  to -250 twice a day  . H/O amiodarone therapy     Satrted 02/12/2012    Past Surgical History  Procedure Date  . Atrial appendage removed   . Mitral valve repair   . Coronary artery bypass graft   . Maze   . Vaginal hysterectomy   . Cholecystectomy   . Breast lumpectomy     x 2  . Cardioversion 02/08/2012    Procedure: CARDIOVERSION;  Surgeon: [name], MD;  Location: [facility] OR;  Service: Cardiovascular;  Laterality: N/A;  . Cardioversion 02/29/2012    Procedure: CARDIOVERSION;  Surgeon: [name], MD;  Location: [facility] OR;  Service: Cardiovascular;  Laterality: N/A;    ROS   Patient denies fever, chills, headache, sweats, rash, change in vision, change in hearing, chest pain, cough, nausea vomiting, urinary symptoms. All other systems are reviewed and are negative.  PHYSICAL EXAM  Patient looks good today. She is oriented to person time and place. Affect is normal. There is no jugulovenous distention. Lungs are clear. Respiratory effort is nonlabored. Cardiac exam reveals S1 and S2. There no clicks or significant  murmurs. The abdomen is soft. There is no peripheral edema.  Filed  Vitals:   04/11/12 1439  BP: 124/72  Pulse: 72  Height: 5\' 3"  (1.6 m)  Weight: 177 lb 12.8 oz (80.65 kg)   EKG is done today and reviewed by me. The corrected QT interval is 483 ms. The rhythm is difficult to interpret. However she has normal sinus rhythm with some PACs.  ASSESSMENT & PLAN

## 2012-04-11 NOTE — Assessment & Plan Note (Signed)
We were concerned that she might need a pacemaker with amiodarone. However her rate is 70 today.

## 2012-04-11 NOTE — Assessment & Plan Note (Signed)
The patient has had shortness of breath in the past related both to pulmonary disease but also to her rapid arrhythmia. When we converted her back to sinus rhythm in June she felt much better

## 2012-04-11 NOTE — Assessment & Plan Note (Signed)
Amiodarone will be continued at 200 mg daily.

## 2012-04-11 NOTE — Assessment & Plan Note (Signed)
I'm very pleased that she is holding sinus rhythm on amiodarone. The drug was started early June, 2013. She's on 200 mg daily and I will continue this dose for now.

## 2012-04-16 ENCOUNTER — Other Ambulatory Visit: Payer: Self-pay | Admitting: Internal Medicine

## 2012-04-16 DIAGNOSIS — Z1231 Encounter for screening mammogram for malignant neoplasm of breast: Secondary | ICD-10-CM

## 2012-04-25 ENCOUNTER — Encounter: Payer: Self-pay | Admitting: Pulmonary Disease

## 2012-04-25 ENCOUNTER — Ambulatory Visit (INDEPENDENT_AMBULATORY_CARE_PROVIDER_SITE_OTHER): Payer: BC Managed Care – PPO | Admitting: Pulmonary Disease

## 2012-04-25 VITALS — BP 168/80 | HR 80 | Temp 98.6°F | Ht 63.0 in | Wt 181.0 lb

## 2012-04-25 DIAGNOSIS — J4520 Mild intermittent asthma, uncomplicated: Secondary | ICD-10-CM

## 2012-04-25 DIAGNOSIS — J309 Allergic rhinitis, unspecified: Secondary | ICD-10-CM | POA: Insufficient documentation

## 2012-04-25 DIAGNOSIS — J45909 Unspecified asthma, uncomplicated: Secondary | ICD-10-CM

## 2012-04-25 DIAGNOSIS — R0602 Shortness of breath: Secondary | ICD-10-CM

## 2012-04-25 NOTE — Progress Notes (Signed)
Chief Complaint  Patient presents with  . Follow-up    Breathing is better. denies any wheezing, cough, chest tx.  has no complaints today    History of Present Illness: Anita Duke is a 71 y.o. female with dyspnea in setting of allergic asthma, diastolic dysfunction, and obesity/deconditioning.  She has been doing well.  She was started on amiodarone in June.  She is off Qvar, and is not needing to use albuterol at all.  She still gets sinus congestion from allergies.  She denies cough, wheeze, chest congestion, or sputum production.   Past Medical History  Diagnosis Date  . DM (diabetes mellitus)   . Atrial fibrillation     Treated with maze procedure / recurrent atrial fibrillation February, 2011... TEE cardioversion October 19, 2009  . Atrial tachycardia     February, 2011... Tikosyn started in hospital  . Bradycardia     Post cardioversion February, 2011,/digoxin/Cardizem metoprolol stopped, may need pacemaker  . Mitral regurgitation     Treated with repair, atrial appendage was removed or tied off at surgery.. this was proven by TEE February, 2011  . Pulmonary HTN     55 mmHg, echo, February, 2011 / no mention of pulmonary hypertension echo, June, 2011  . Right ventricular dysfunction     Mild to moderate, echo, February, 2011 / normalized echo, June, 2011  . TR (tricuspid regurgitation)     Moderate, echo, February, 2011 / trivial, echo, June, 2011  . Mitral stenosis     Mild, February, 2011, post mitral valve repair / mild, echo, June, 2011  . CAD (coronary artery disease)     LIMA to the LAD at time of mitral valve repair / LIMA atretic,, February, 2011  . Ejection fraction < 50%     EF 30% echo and TEE diagnosis February, 2011, possibly rate related / no contraction by catheter, bradycardia so carvedilol cannot be used / EF 55-60% echo, June, 2011 / EF 50%, echo, February, 2012  . HTN (hypertension)   . Hemorrhoids, internal   . Diverticulosis of colon   .  Colon polyp, hyperplastic   . Environmental allergies   . atrial appendage     Removed were tied off at surgery, proven by TEE February, 2011  . S/P mitral valve repair     [facility] / Maze procedure/ atrial appendage removed were tied off  . Hx of CABG     At time of mitral valve repair  . Rash     ?Vicodin, Lotensin, Toprol  . CHF (congestive heart failure)     Mild in hospital February, 2011  . Drug therapy     Tikosyn,  stopped 02/10/2012  . Shortness of breath     Evaluated by Dr. Craige Cotta. Mat [id number].... bronchodilator response,,  medication started  /  patient much improved June 14, 201 2  . QT prolongation     Tikosyn and Effexor. QT prolonged October 13, 2011, peak is in dose reduced from 500  to -250 twice a day  . H/O amiodarone therapy     Satrted 02/12/2012    Past Surgical History  Procedure Date  . Atrial appendage removed   . Mitral valve repair   . Coronary artery bypass graft   . Maze   . Vaginal hysterectomy   . Cholecystectomy   . Breast lumpectomy     x 2  . Cardioversion 02/08/2012    Procedure: CARDIOVERSION;  Surgeon: [name], MD;  Location: [facility]  OR;  Service: Cardiovascular;  Laterality: N/A;  . Cardioversion 02/29/2012    Procedure: CARDIOVERSION;  Surgeon: [name], MD;  Location: [facility] OR;  Service: Cardiovascular;  Laterality: N/A;    Allergies  Allergen Reactions  . Meperidine Hcl   . Morphine   . Tetanus Toxoid     Physical Exam:  Blood pressure 168/80, pulse 80, temperature 98.6 F (37 C), temperature source Oral, height 5\' 3"  (1.6 m), weight 181 lb (82.101 kg), SpO2 98.00%.  Body mass index is 32.06 kg/(m^2). Wt Readings from Last 2 Encounters:  04/25/12 181 lb (82.101 kg)  04/11/12 177 lb 12.8 oz (80.65 kg)    General - no distress  HEENT - PERRLA, EOMI, no sinus tenderness, clear sinus drainage, no oral exudate, no LAN, no thyromegaly  Cardiac - s1s2 with 2/6 systolic murmur, pulses symmetric repeat pulse check by me was 64   Chest - no wheeze/rales/dullness  Abd - soft, nontender, normal bowel sounds  Ext - no e/c/c  Neuro - normal strength, CN intact, A&O x 3  Psych - normal mood/behavior  Skin - no rashes  Assessment/Plan:  Outpatient Encounter Prescriptions as of 04/25/2012  Medication Sig Dispense Refill  . acetaminophen (TYLENOL) 500 MG tablet Take 1,000 mg by mouth every 6 (six) hours as needed. For pain      . albuterol (PROVENTIL HFA;VENTOLIN HFA) 108 (90 BASE) MCG/ACT inhaler Inhale 2 puffs into the lungs every 6 (six) hours as needed. For shortness of breath      . amiodarone (PACERONE) 200 MG tablet Once a day      . aspirin 81 MG tablet Take 81 mg by mouth daily.        Marland Kitchen atorvastatin (LIPITOR) 40 MG tablet Take 40 mg by mouth daily.      . calcium carbonate (OS-CAL) 600 MG TABS Take 600 mg by mouth daily.       . cholecalciferol (VITAMIN D) 1000 UNITS tablet Take 4,000 Units by mouth daily.      . fish oil-omega-3 fatty acids 1000 MG capsule Take 2 g by mouth 2 (two) times daily.        Marland Kitchen FREESTYLE LITE test strip 1 each by Other route as needed.       . furosemide (LASIX) 40 MG tablet Take 20 mg by mouth daily.      . metFORMIN (GLUCOPHAGE) 500 MG tablet Take 500 mg by mouth 2 (two) times daily with a meal.      . potassium chloride SA (K-DUR,KLOR-CON) 20 MEQ tablet Take 2 tablets (40 mEq total) by mouth daily.  180 tablet  3  . trandolapril (MAVIK) 4 MG tablet Take 8 mg by mouth daily.       Marland Kitchen venlafaxine (EFFEXOR) 37.5 MG tablet Take 37.5 mg by mouth daily.       Marland Kitchen warfarin (COUMADIN) 5 MG tablet Take as directed by anticoagulation clinic  30 tablet  3  . DISCONTD: amiodarone (PACERONE) 200 MG tablet Take one and one-half tabs (300mg ) daily for 2 weeks and then decrease to 1 tablet (200mg ) daily.  45 tablet  11

## 2012-04-25 NOTE — Assessment & Plan Note (Signed)
Much improved.  A good portion of this was likely related to her arrhythmias and diastolic dysfunction.  She is followed closely by cardiology.

## 2012-04-25 NOTE — Assessment & Plan Note (Signed)
She is stable, and not really needing to use inhalers at present.  Advised she can f/u with her PCP for this, and return to pulmonary as needed.

## 2012-04-25 NOTE — Patient Instructions (Addendum)
Follow up as needed

## 2012-04-25 NOTE — Assessment & Plan Note (Signed)
Advised she can use nasal irrigation and OTC anti-histamine as needed.

## 2012-04-26 ENCOUNTER — Other Ambulatory Visit: Payer: Self-pay | Admitting: *Deleted

## 2012-04-26 MED ORDER — AMIODARONE HCL 200 MG PO TABS: 200.0000 mg | ORAL_TABLET | Freq: Every day | ORAL | Status: DC

## 2012-04-29 ENCOUNTER — Telehealth: Payer: Self-pay

## 2012-04-29 NOTE — Telephone Encounter (Signed)
Mrs Backs is complaining of sob since Sat.  She was nauseated all weekend.  Slight swelling in bil ankles and feet (L>R).  No pain today.  Maybe once or twice on Sat and Sun.  She felt like her heart might have been a little fast and irregular this am but is ok now.

## 2012-04-29 NOTE — Telephone Encounter (Signed)
Anita Duke is to take 40mg  of Lasix x 3 days per Dr Ladona Ridgel.  She will call back after those 3 days to let us know how she is doing.

## 2012-05-02 ENCOUNTER — Ambulatory Visit (INDEPENDENT_AMBULATORY_CARE_PROVIDER_SITE_OTHER): Payer: BC Managed Care – PPO | Admitting: Pharmacist

## 2012-05-02 ENCOUNTER — Ambulatory Visit: Payer: BC Managed Care – PPO

## 2012-05-02 DIAGNOSIS — I4891 Unspecified atrial fibrillation: Secondary | ICD-10-CM

## 2012-05-02 DIAGNOSIS — Z8679 Personal history of other diseases of the circulatory system: Secondary | ICD-10-CM

## 2012-05-02 DIAGNOSIS — Z7901 Long term (current) use of anticoagulants: Secondary | ICD-10-CM

## 2012-05-02 LAB — POCT INR: INR: 5

## 2012-05-02 NOTE — Telephone Encounter (Signed)
Anita Duke states she is feeling much better.  Swelling decreased.  She will call if increased problems.

## 2012-05-13 ENCOUNTER — Telehealth: Payer: Self-pay | Admitting: Cardiology

## 2012-05-13 ENCOUNTER — Ambulatory Visit (INDEPENDENT_AMBULATORY_CARE_PROVIDER_SITE_OTHER): Payer: BC Managed Care – PPO | Admitting: Cardiology

## 2012-05-13 ENCOUNTER — Ambulatory Visit
Admission: RE | Admit: 2012-05-13 | Discharge: 2012-05-13 | Disposition: A | Discharge status: Home / Self Care | Payer: BC Managed Care – PPO | Source: Ambulatory Visit | Attending: Cardiology | Admitting: Cardiology

## 2012-05-13 ENCOUNTER — Ambulatory Visit (INDEPENDENT_AMBULATORY_CARE_PROVIDER_SITE_OTHER)
Admission: RE | Admit: 2012-05-13 | Discharge: 2012-05-13 | Disposition: A | Discharge status: Home / Self Care | Payer: BC Managed Care – PPO | Source: Ambulatory Visit | Attending: Cardiology | Admitting: Cardiology

## 2012-05-13 ENCOUNTER — Ambulatory Visit (INDEPENDENT_AMBULATORY_CARE_PROVIDER_SITE_OTHER): Payer: BC Managed Care – PPO | Admitting: *Deleted

## 2012-05-13 ENCOUNTER — Encounter: Payer: Self-pay | Admitting: Cardiology

## 2012-05-13 VITALS — BP 172/97 | HR 119 | Ht 63.0 in | Wt 179.0 lb

## 2012-05-13 DIAGNOSIS — Z7901 Long term (current) use of anticoagulants: Secondary | ICD-10-CM

## 2012-05-13 DIAGNOSIS — Z01812 Encounter for preprocedural laboratory examination: Secondary | ICD-10-CM

## 2012-05-13 DIAGNOSIS — R001 Bradycardia, unspecified: Secondary | ICD-10-CM

## 2012-05-13 DIAGNOSIS — R0602 Shortness of breath: Secondary | ICD-10-CM

## 2012-05-13 DIAGNOSIS — I498 Other specified cardiac arrhythmias: Secondary | ICD-10-CM

## 2012-05-13 DIAGNOSIS — I10 Essential (primary) hypertension: Secondary | ICD-10-CM

## 2012-05-13 DIAGNOSIS — I4891 Unspecified atrial fibrillation: Secondary | ICD-10-CM

## 2012-05-13 DIAGNOSIS — I050 Rheumatic mitral stenosis: Secondary | ICD-10-CM

## 2012-05-13 DIAGNOSIS — Z8679 Personal history of other diseases of the circulatory system: Secondary | ICD-10-CM

## 2012-05-13 DIAGNOSIS — R9431 Abnormal electrocardiogram [ECG] [EKG]: Secondary | ICD-10-CM

## 2012-05-13 LAB — CBC WITH DIFFERENTIAL/PLATELET
Basophils Relative: 0.4 % (ref 0.0–3.0)
Eosinophils Relative: 0.1 % (ref 0.0–5.0)
Hemoglobin: 11.9 g/dL — ABNORMAL LOW (ref 12.0–15.0)
Lymphocytes Relative: 15.9 % (ref 12.0–46.0)
MCV: 87.3 fl (ref 78.0–100.0)
Neutro Abs: 7.1 10*3/uL (ref 1.4–7.7)
Neutrophils Relative %: 71.8 % (ref 43.0–77.0)
RBC: 4.18 Mil/uL (ref 3.87–5.11)
WBC: 9.9 10*3/uL (ref 4.5–10.5)

## 2012-05-13 LAB — BASIC METABOLIC PANEL
CO2: 26 mEq/L (ref 19–32)
Calcium: 8.9 mg/dL (ref 8.4–10.5)
Chloride: 104 mEq/L (ref 96–112)
Creatinine, Ser: 0.5 mg/dL (ref 0.4–1.2)
Sodium: 139 mEq/L (ref 135–145)

## 2012-05-13 NOTE — Assessment & Plan Note (Signed)
The patient has mild functional mitral stenosis after her mitral valve repair. This certainly plays a role with her feeling poorly if she has rapid atrial fibrillation.

## 2012-05-13 NOTE — Telephone Encounter (Signed)
She states she feels like she is in afib again.

## 2012-05-13 NOTE — Assessment & Plan Note (Signed)
The patient continues on Coumadin. We will check an INR. However we do not have to check records backwards. She had her left atrial appendage removed at the time of her mitral valve repair. We have used Coumadin just to be extra careful.

## 2012-05-13 NOTE — Assessment & Plan Note (Signed)
We have been worried about possible bradycardia while on amiodarone. However in sinus rhythm she did not have marked bradycardia.

## 2012-05-13 NOTE — Telephone Encounter (Signed)
New problem:  C/O sob, weak, Afib. H/O cardioversion on 6/27.

## 2012-05-13 NOTE — Assessment & Plan Note (Signed)
The patient does have a pulmonary component to her shortness of breath at times. However most recently her symptoms been related to her atrial fibrillation. It seems unlikely that she has any evidence of amiodarone toxicity at this point. Chest x-ray will be done.

## 2012-05-13 NOTE — Progress Notes (Signed)
Patient ID: Anita Duke, female   DOB: 1940/09/13, 71 y.o.   MRN: [id number]   HPI   The patient is seen as an add-on today. She's been feeling poorly. She seemed to have some volume overload in the past few weeks and her medicines were adjusted. She was away on vacation last week and felt that her overall symptoms he increased. She has shortness of breath and she feels as she did before when she had rapid atrial fibrillation. She called today and we brought her into the office. I have reviewed her extensive history.  Allergies  Allergen Reactions  . Meperidine Hcl   . Morphine   . Tetanus Toxoid     Current Outpatient Prescriptions  Medication Sig Dispense Refill  . acetaminophen (TYLENOL) 500 MG tablet Take 1,000 mg by mouth every 6 (six) hours as needed. For pain      . albuterol (PROVENTIL HFA;VENTOLIN HFA) 108 (90 BASE) MCG/ACT inhaler Inhale 2 puffs into the lungs every 6 (six) hours as needed. For shortness of breath      . amiodarone (PACERONE) 200 MG tablet Take 1 tablet (200 mg total) by mouth daily. Once a day  90 tablet  2  . aspirin 81 MG tablet Take 81 mg by mouth daily.        Marland Kitchen atorvastatin (LIPITOR) 40 MG tablet Take 40 mg by mouth daily.      . calcium carbonate (OS-CAL) 600 MG TABS Take 600 mg by mouth daily.       . cholecalciferol (VITAMIN D) 1000 UNITS tablet Take 4,000 Units by mouth daily.      . fish oil-omega-3 fatty acids 1000 MG capsule Take 2 g by mouth 2 (two) times daily.        Marland Kitchen FREESTYLE LITE test strip 1 each by Other route as needed.       . furosemide (LASIX) 40 MG tablet Take 20 mg by mouth daily.      . metFORMIN (GLUCOPHAGE) 500 MG tablet Take 500 mg by mouth 2 (two) times daily with a meal.      . potassium chloride SA (K-DUR,KLOR-CON) 20 MEQ tablet Take 2 tablets (40 mEq total) by mouth daily.  180 tablet  3  . trandolapril (MAVIK) 4 MG tablet Take 8 mg by mouth daily.       Marland Kitchen venlafaxine (EFFEXOR) 37.5 MG tablet Take 37.5 mg by mouth daily.        Marland Kitchen warfarin (COUMADIN) 5 MG tablet Take as directed by anticoagulation clinic  30 tablet  3    History   Social History  . Marital Status: Married    Spouse Name: N/A    Number of Children: N/A  . 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     Social History Main Topics  . Smoking status: Never Smoker   . Smokeless tobacco: Never Used  . Alcohol Use: No  . Drug Use: No  . Sexually Active: Not on file   Other Topics Concern  . Not on file   Social History Narrative   Does not et regular exerciseDaily caffeine use    Family History  Problem Relation Age of Onset  . Cancer Maternal Grandfather   . Heart disease Father   . Heart disease Sister   . Diabetes Mother   . Diabetes Father   . Diabetes Sister   . Diabetes Paternal Grandfather   .  Diabetes Paternal Grandmother   . Heart disease Paternal Uncle     Past Medical History  Diagnosis Date  . DM (diabetes mellitus)   . Atrial fibrillation     Treated with maze procedure / recurrent atrial fibrillation February, 2011... TEE cardioversion October 19, 2009  . Atrial tachycardia     February, 2011... Tikosyn started in hospital  . Bradycardia     Post cardioversion February, 2011,/digoxin/Cardizem metoprolol stopped, may need pacemaker  . Mitral regurgitation     Treated with repair, atrial appendage was removed or tied off at surgery.. this was proven by TEE February, 2011  . Pulmonary HTN     55 mmHg, echo, February, 2011 / no mention of pulmonary hypertension echo, June, 2011  . Right ventricular dysfunction     Mild to moderate, echo, February, 2011 / normalized echo, June, 2011  . TR (tricuspid regurgitation)     Moderate, echo, February, 2011 / trivial, echo, June, 2011  . Mitral stenosis     Mild, February, 2011, post mitral valve repair / mild, echo, June, 2011  . CAD (coronary artery disease)     LIMA to the LAD at time of mitral valve repair / LIMA atretic,,  February, 2011  . Ejection fraction < 50%     EF 30% echo and TEE diagnosis February, 2011, possibly rate related / no contraction by catheter, bradycardia so carvedilol cannot be used / EF 55-60% echo, June, 2011 / EF 50%, echo, February, 2012  . HTN (hypertension)   . Hemorrhoids, internal   . Diverticulosis of colon   . Colon polyp, hyperplastic   . Environmental allergies   . atrial appendage     Removed were tied off at surgery, proven by TEE February, 2011  . S/P mitral valve repair     [facility] / Maze procedure/ atrial appendage removed were tied off  . Hx of CABG     At time of mitral valve repair  . Rash     ?Vicodin, Lotensin, Toprol  . CHF (congestive heart failure)     Mild in hospital February, 2011  . Drug therapy     Tikosyn,  stopped 02/10/2012  . Shortness of breath     Evaluated by Dr. Craige Cotta. Mat [id number].... bronchodilator response,,  medication started  /  patient much improved June 14, 201 2  . QT prolongation     Tikosyn and Effexor. QT prolonged October 13, 2011, peak is in dose reduced from 500  to -250 twice a day  . H/O amiodarone therapy     Satrted 02/12/2012    Past Surgical History  Procedure Date  . Atrial appendage removed   . Mitral valve repair   . Coronary artery bypass graft   . Maze   . Vaginal hysterectomy   . Cholecystectomy   . Breast lumpectomy     x 2  . Cardioversion 02/08/2012    Procedure: CARDIOVERSION;  Surgeon: [name], MD;  Location: [facility] OR;  Service: Cardiovascular;  Laterality: N/A;  . Cardioversion 02/29/2012    Procedure: CARDIOVERSION;  Surgeon: [name], MD;  Location: [facility] OR;  Service: Cardiovascular;  Laterality: N/A;    ROS   Patient denies fever, chills, headache, sweats, rash, change in vision, change in hearing, chest pain, cough, nausea vomiting, urinary symptoms. All other systems other than the history of present illness are negative.  PHYSICAL EXAM   Patient is here with her husband. She does not  feel  well but she is stable. Neck reveals no jugular venous distention. Lungs are clear. Respiratory effort is nonlabored. Cardiac exam reveals S1 and S2. The rhythm is irregularly irregular. The abdomen is soft. There is no peripheral edema. There no musculoskeletal deformities. There are no skin rashes.  Filed Vitals:   05/13/12 1029 05/13/12 1031 05/13/12 1035 05/13/12 1039  BP: 172/97 172/97 164/101 172/97  Pulse: 119 119 137 119  Height: 7\' 9"  (2.362 m)   5\' 3"  (1.6 m)  Weight: 179 lb (81.194 kg)   179 lb (81.194 kg)   EKG is done today and reviewed by me. She has returned to rapid atrial fibrillation. The overall rate is in the 110-120 range. The corrected QT interval appears to be prolonged in this setting. This is probably not reliable with her atrial fib.  ASSESSMENT & PLAN

## 2012-05-13 NOTE — H&P (Signed)
Patient ID: Anita Duke, female DOB: 03-28-1941, 71 y.o. MRN: [id number]  HPI  The patient is seen as an add-on today. She's been feeling poorly. She seemed to have some volume overload in the past few weeks and her medicines were adjusted. She was away on vacation last week and felt that her overall symptoms he increased. She has shortness of breath and she feels as she did before when she had rapid atrial fibrillation. She called today and we brought her into the office. I have reviewed her extensive history.  Allergies   Allergen  Reactions   .  Meperidine Hcl    .  Morphine    .  Tetanus Toxoid     Current Outpatient Prescriptions   Medication  Sig  Dispense  Refill   .  acetaminophen (TYLENOL) 500 MG tablet  Take 1,000 mg by mouth every 6 (six) hours as needed. For pain     .  albuterol (PROVENTIL HFA;VENTOLIN HFA) 108 (90 BASE) MCG/ACT inhaler  Inhale 2 puffs into the lungs every 6 (six) hours as needed. For shortness of breath     .  amiodarone (PACERONE) 200 MG tablet  Take 1 tablet (200 mg total) by mouth daily. Once a day  90 tablet  2   .  aspirin 81 MG tablet  Take 81 mg by mouth daily.     Marland Kitchen  atorvastatin (LIPITOR) 40 MG tablet  Take 40 mg by mouth daily.     .  calcium carbonate (OS-CAL) 600 MG TABS  Take 600 mg by mouth daily.     .  cholecalciferol (VITAMIN D) 1000 UNITS tablet  Take 4,000 Units by mouth daily.     .  fish oil-omega-3 fatty acids 1000 MG capsule  Take 2 g by mouth 2 (two) times daily.     Marland Kitchen  FREESTYLE LITE test strip  1 each by Other route as needed.     .  furosemide (LASIX) 40 MG tablet  Take 20 mg by mouth daily.     .  metFORMIN (GLUCOPHAGE) 500 MG tablet  Take 500 mg by mouth 2 (two) times daily with a meal.     .  potassium chloride SA (K-DUR,KLOR-CON) 20 MEQ tablet  Take 2 tablets (40 mEq total) by mouth daily.  180 tablet  3   .  trandolapril (MAVIK) 4 MG tablet  Take 8 mg by mouth daily.     Marland Kitchen  venlafaxine (EFFEXOR) 37.5 MG tablet  Take 37.5 mg by  mouth daily.     Marland Kitchen  warfarin (COUMADIN) 5 MG tablet  Take as directed by anticoagulation clinic  30 tablet  3    History    Social History   .  Marital Status:  Married     Spouse Name:  N/A     Number of Children:  N/A   .  Years of Education:  N/A    Occupational History   .  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      Social History Main Topics   .  Smoking status:  Never Smoker   .  Smokeless tobacco:  Never Used   .  Alcohol Use:  No   .  Drug Use:  No   .  Sexually Active:  Not on file    Other Topics  Concern   .  Not on file    Social History Narrative    Does  not et regular exerciseDaily caffeine use    Family History   Problem  Relation  Age of Onset   .  Cancer  Maternal Grandfather    .  Heart disease  Father    .  Heart disease  Sister    .  Diabetes  Mother    .  Diabetes  Father    .  Diabetes  Sister    .  Diabetes  Paternal Grandfather    .  Diabetes  Paternal Grandmother    .  Heart disease  Paternal Uncle     Past Medical History   Diagnosis  Date   .  DM (diabetes mellitus)    .  Atrial fibrillation      Treated with maze procedure / recurrent atrial fibrillation February, 2011... TEE cardioversion October 19, 2009   .  Atrial tachycardia      February, 2011... Tikosyn started in hospital   .  Bradycardia      Post cardioversion February, 2011,/digoxin/Cardizem metoprolol stopped, may need pacemaker   .  Mitral regurgitation      Treated with repair, atrial appendage was removed or tied off at surgery.. this was proven by TEE February, 2011   .  Pulmonary HTN      55 mmHg, echo, February, 2011 / no mention of pulmonary hypertension echo, June, 2011   .  Right ventricular dysfunction      Mild to moderate, echo, February, 2011 / normalized echo, June, 2011   .  TR (tricuspid regurgitation)      Moderate, echo, February, 2011 / trivial, echo, June, 2011   .  Mitral stenosis      Mild, February, 2011, post mitral valve repair / mild, echo,  June, 2011   .  CAD (coronary artery disease)      LIMA to the LAD at time of mitral valve repair / LIMA atretic,, February, 2011   .  Ejection fraction < 50%      EF 30% echo and TEE diagnosis February, 2011, possibly rate related / no contraction by catheter, bradycardia so carvedilol cannot be used / EF 55-60% echo, June, 2011 / EF 50%, echo, February, 2012   .  HTN (hypertension)    .  Hemorrhoids, internal    .  Diverticulosis of colon    .  Colon polyp, hyperplastic    .  Environmental allergies    .  atrial appendage      Removed were tied off at surgery, proven by TEE February, 2011   .  S/P mitral valve repair      [facility] / Maze procedure/ atrial appendage removed were tied off   .  Hx of CABG      At time of mitral valve repair   .  Rash      ?Vicodin, Lotensin, Toprol   .  CHF (congestive heart failure)      Mild in hospital February, 2011   .  Drug therapy      Tikosyn, stopped 02/10/2012   .  Shortness of breath      Evaluated by Dr. Craige Cotta. Mat [id number].... bronchodilator response,, medication started / patient much improved June 14, 201 2   .  QT prolongation      Tikosyn and Effexor. QT prolonged October 13, 2011, peak is in dose reduced from 500 to -250 twice a day   .  H/O amiodarone therapy      Satrted 02/12/2012  Past Surgical History   Procedure  Date   .  Atrial appendage removed    .  Mitral valve repair    .  Coronary artery bypass graft    .  Maze    .  Vaginal hysterectomy    .  Cholecystectomy    .  Breast lumpectomy      x 2   .  Cardioversion  02/08/2012     Procedure: CARDIOVERSION; Surgeon: [name], MD; Location: [facility] OR; Service: Cardiovascular; Laterality: N/A;   .  Cardioversion  02/29/2012     Procedure: CARDIOVERSION; Surgeon: [name], MD; Location: [facility] OR; Service: Cardiovascular; Laterality: N/A;    ROS  Patient denies fever, chills, headache, sweats, rash, change in vision, change in hearing, chest pain, cough, nausea  vomiting, urinary symptoms. All other systems other than the history of present illness are negative.  PHYSICAL EXAM  Patient is here with her husband. She does not feel well but she is stable. Neck reveals no jugular venous distention. Lungs are clear. Respiratory effort is nonlabored. Cardiac exam reveals S1 and S2. The rhythm is irregularly irregular. The abdomen is soft. There is no peripheral edema. There no musculoskeletal deformities. There are no skin rashes.  Filed Vitals:    05/13/12 1029  05/13/12 1031  05/13/12 1035  05/13/12 1039   BP:  172/97  172/97  164/101  172/97   Pulse:  119  119  137  119   Height:  7\' 9"  (2.362 m)    5\' 3"  (1.6 m)   Weight:  179 lb (81.194 kg)    179 lb (81.194 kg)    EKG is done today and reviewed by me. She has returned to rapid atrial fibrillation. The overall rate is in the 110-120 range. The corrected QT interval appears to be prolonged in this setting. This is probably not reliable with her atrial fib.  ASSESSMENT & PLAN   HYPERTENSION - [name], MD 05/13/2012 10:55 AM Signed  The patient's systolic blood pressure is mildly elevated today. She's had some dizziness at home. Complete orthostatic blood pressure check reveals that her pressure starts at 170 lying and and is up 145 systolic when standing after 5 minutes. I will not be changing her medicines today. We will wait until she returns to sinus rhythm. Atrial fibrillation - [name], MD 05/13/2012 11:05 AM Addendum  The patient has returned to atrial fibrillation. She feels poorly with this. She is not unstable. I had weaned her amiodarone dose down to 200 mg daily. I will give her several extra doses of amiodarone and proceed with cardioversion on September 11. Overall this is a difficult situation. I have discussed the possibility of atrial fibrillation with Dr. Johney Frame. This has not been a good option because she has multiple different atrial arrhythmias. For now we will try to increase the  amiodarone dose and hold that she does not have resting significant bradycardia. If this is the case she'll require pacemaker.  Part of today's evaluation includes the full assessment as outlined. In addition it includes writing orders for her cardioversion and making all of these plans. Previous Version  Bradycardia - [name], MD 05/13/2012 11:01 AM Signed  We have been worried about possible bradycardia while on amiodarone. However in sinus rhythm she did not have marked bradycardia. Mitral stenosis - [name], MD 05/13/2012 11:02 AM Signed  The patient has mild functional mitral stenosis after her mitral valve repair. This certainly plays  a role with her feeling poorly if she has rapid atrial fibrillation. Warfarin anticoagulation - [name], MD 05/13/2012 11:02 AM Signed  The patient continues on Coumadin. We will check an INR. However we do not have to check records backwards. She had her left atrial appendage removed at the time of her mitral valve repair. We have used Coumadin just to be extra careful. Shortness of breath - [name], MD 05/13/2012 11:03 AM Signed  The patient does have a pulmonary component to her shortness of breath at times. However most recently her symptoms been related to her atrial fibrillation. It seems unlikely that she has any evidence of amiodarone toxicity at this point. Chest x-ray will be done. QT prolongation - [name], MD 05/13/2012 11:03 AM Signed  I've been very careful with the following of her QT interval. Her QT interval was in the normal range while she was on higher dose amiodarone before was lowered. This will be followed again when she is back in sinus rhythm.  Jerral Bonito, MD

## 2012-05-13 NOTE — Telephone Encounter (Signed)
Pt is complaining of feeling weak, sob and generally "terrible".   It gradually got worse all of last week and has been in bed all weekend.

## 2012-05-13 NOTE — Patient Instructions (Addendum)
Your physician has recommended you make the following change in your medication: INCREASE your Amiodarone to 200mg  when you arrive home and take 400mg  tonight, Take 400mg  twice daily tomorrow.  Take 400mg  Wed am.  You are scheduled for a cardioversion on Wed.  See your instruction letter for instructions  A chest x-ray takes a picture of the organs and structures inside the chest, including the heart, lungs, and blood vessels. This test can show several things, including, whether the heart is enlarges; whether fluid is building up in the lungs; and whether pacemaker / defibrillator leads are still in place.  Your physician recommends that you return for lab work in: today (bmet, cbc)  Get your inr checked today in the coumadin clinic

## 2012-05-13 NOTE — Assessment & Plan Note (Signed)
The patient's systolic blood pressure is mildly elevated today. She's had some dizziness at home. Complete orthostatic blood pressure check reveals that her pressure starts at 170 lying and and is up 145 systolic when standing after 5 minutes. I will not be changing her medicines today. We will wait until she returns to sinus rhythm.

## 2012-05-13 NOTE — Telephone Encounter (Signed)
Appt scheduled this am.

## 2012-05-13 NOTE — Assessment & Plan Note (Addendum)
The patient has returned to atrial fibrillation. She feels poorly with this. She is not unstable. I had weaned her amiodarone dose down to 200 mg daily. I will give her several extra doses of amiodarone and proceed with cardioversion on September 11. Overall this is a difficult situation. I have discussed the possibility of atrial fibrillation with Dr. Johney Frame. This has not been a good option because she has multiple different atrial arrhythmias. For now we will try to increase the amiodarone dose and hold that she does not have resting significant bradycardia. If this is the case she'll require pacemaker.  Part of today's evaluation includes the full assessment as outlined. In addition it includes writing orders for her cardioversion and making all of these plans.

## 2012-05-13 NOTE — Assessment & Plan Note (Signed)
I've been very careful with the following of her QT interval. Her QT interval was in the normal range while she was on higher dose amiodarone before was lowered. This will be followed again when she is back in sinus rhythm.

## 2012-05-14 MED ORDER — SODIUM CHLORIDE 0.45 % IV SOLN: INTRAVENOUS | Status: DC

## 2012-05-15 ENCOUNTER — Encounter (HOSPITAL_COMMUNITY): Payer: Self-pay

## 2012-05-15 ENCOUNTER — Encounter (HOSPITAL_COMMUNITY): Payer: Self-pay | Admitting: Anesthesiology

## 2012-05-15 ENCOUNTER — Ambulatory Visit (HOSPITAL_COMMUNITY): Payer: BC Managed Care – PPO | Admitting: Anesthesiology

## 2012-05-15 ENCOUNTER — Ambulatory Visit (HOSPITAL_COMMUNITY)
Admission: RE | Admit: 2012-05-15 | Discharge: 2012-05-15 | Disposition: A | Discharge status: Home / Self Care | Payer: BC Managed Care – PPO | Source: Ambulatory Visit | Attending: Cardiology | Admitting: Cardiology

## 2012-05-15 ENCOUNTER — Encounter (HOSPITAL_COMMUNITY)
Admission: RE | Disposition: A | Discharge status: Home / Self Care | Payer: Self-pay | Source: Ambulatory Visit | Attending: Cardiology

## 2012-05-15 DIAGNOSIS — I2510 Atherosclerotic heart disease of native coronary artery without angina pectoris: Secondary | ICD-10-CM | POA: Insufficient documentation

## 2012-05-15 DIAGNOSIS — I4891 Unspecified atrial fibrillation: Secondary | ICD-10-CM

## 2012-05-15 DIAGNOSIS — I509 Heart failure, unspecified: Secondary | ICD-10-CM | POA: Insufficient documentation

## 2012-05-15 DIAGNOSIS — E119 Type 2 diabetes mellitus without complications: Secondary | ICD-10-CM | POA: Insufficient documentation

## 2012-05-15 DIAGNOSIS — J45909 Unspecified asthma, uncomplicated: Secondary | ICD-10-CM | POA: Insufficient documentation

## 2012-05-15 DIAGNOSIS — I10 Essential (primary) hypertension: Secondary | ICD-10-CM | POA: Insufficient documentation

## 2012-05-15 HISTORY — PX: CARDIOVERSION: SHX1299

## 2012-05-15 LAB — PROTIME-INR: Prothrombin Time: 31.2 seconds — ABNORMAL HIGH (ref 11.6–15.2)

## 2012-05-15 SURGERY — CARDIOVERSION
Anesthesia: Monitor Anesthesia Care | Wound class: Clean

## 2012-05-15 MED ORDER — SODIUM CHLORIDE 0.9 % IV SOLN
INTRAVENOUS | Status: DC | PRN
  Administered 2012-05-15: 13:00:00 via INTRAVENOUS

## 2012-05-15 MED ORDER — PROPOFOL 10 MG/ML IV BOLUS
INTRAVENOUS | Status: DC | PRN
  Administered 2012-05-15: 90 mg via INTRAVENOUS

## 2012-05-15 MED ORDER — LIDOCAINE HCL (CARDIAC) 20 MG/ML IV SOLN
INTRAVENOUS | Status: DC | PRN
  Administered 2012-05-15: 20 mg via INTRAVENOUS

## 2012-05-15 MED ORDER — SODIUM CHLORIDE 0.9 % IV SOLN
INTRAVENOUS | Status: DC
  Administered 2012-05-15: 500 mL via INTRAVENOUS

## 2012-05-15 NOTE — Anesthesia Preprocedure Evaluation (Signed)
Anesthesia Evaluation    Airway       Dental   Pulmonary shortness of breath and at rest, asthma ,          Cardiovascular hypertension, Pt. on medications + CAD and +CHF + dysrhythmias     Neuro/Psych    GI/Hepatic   Endo/Other  diabetes, Type 2  Renal/GU      Musculoskeletal   Abdominal   Peds  Hematology   Anesthesia Other Findings   Reproductive/Obstetrics                           Anesthesia Physical Anesthesia Plan Anesthesia Quick Evaluation

## 2012-05-15 NOTE — CV Procedure (Signed)
   The patient was very carefully arranged for cardioversion. The H&P is attached to this admission. The patient had recurrent atrial fibrillation. She is symptomatic with this. When I saw HER-2 days ago we push the dose of her amiodarone up. Her INR was greater than 2.9. Today she is n.p.o. And prepared for cardioversion.  Anesthesia was present. The patient received IV propofol. Anterior posterior pads were in place. The biphasic defibrillator was used. The patient was given one shock with 120 J. She converted to sinus rhythm. She had mild sinus bradycardia. She was waking up well.  Before the procedure the patient had given full consent. She did extremely well. I spoke with the patient's husband immediately after the procedure. I explained that she should remain on 400 mg of amiodarone at home. I'll be in touch with her about her next office visit.  Jerral Bonito, MD

## 2012-05-15 NOTE — Preoperative (Signed)
Beta Blockers   Reason not to administer Beta Blockers:Not Applicable 

## 2012-05-15 NOTE — Transfer of Care (Signed)
Immediate Anesthesia Transfer of Care Note  Patient: Anita Duke  Procedure(s) Performed: Procedure(s) (LRB) with comments: CARDIOVERSION (N/A)  Patient Location: PACU  Anesthesia Type: MAC  Level of Consciousness: awake and patient cooperative  Airway & Oxygen Therapy: Patient Spontanous Breathing  Post-op Assessment: Report given to PACU RN, Post -op Vital signs reviewed and stable and Patient moving all extremities X 4  Post vital signs: Reviewed and stable  Complications: No apparent anesthesia complications

## 2012-05-16 ENCOUNTER — Encounter (HOSPITAL_COMMUNITY): Payer: Self-pay | Admitting: Cardiology

## 2012-05-16 ENCOUNTER — Ambulatory Visit (INDEPENDENT_AMBULATORY_CARE_PROVIDER_SITE_OTHER): Payer: BC Managed Care – PPO | Admitting: Cardiology

## 2012-05-16 VITALS — BP 156/62 | HR 67 | Ht 63.0 in | Wt 179.0 lb

## 2012-05-16 DIAGNOSIS — R001 Bradycardia, unspecified: Secondary | ICD-10-CM

## 2012-05-16 DIAGNOSIS — Z7901 Long term (current) use of anticoagulants: Secondary | ICD-10-CM

## 2012-05-16 DIAGNOSIS — I4891 Unspecified atrial fibrillation: Secondary | ICD-10-CM

## 2012-05-16 DIAGNOSIS — I498 Other specified cardiac arrhythmias: Secondary | ICD-10-CM

## 2012-05-16 NOTE — Assessment & Plan Note (Signed)
She is anticoagulated. Dr. Johney Frame is aware of this.

## 2012-05-16 NOTE — Assessment & Plan Note (Signed)
The rhythm today appears to be junctional. It is not exceedingly slow. However the rate does not increase when she walks down the hall and becomes short of breath. Over time I have had extensive discussions with Dr. Johney Frame about this patient. She failed Tikosyn. She was then changed to amiodarone. We were concerned about bradycardia but she was stable on amiodarone initially. On 200 mg of amiodarone she reverted to atrial fibrillation. Her rate is not exceedingly fast. However she has significant symptoms when she has atrial fibrillation. She was cardioverted yesterday and has not returned atrial fibrillation. However she feels poorly today and I think it is because her rhythm is junctional with no response to walking. Dr. Johney Frame and I have discussed the possibility of pacing and the possibility of AV node ablation in the past. I spoke with him several times today. We have decided to proceed with a pacemaker tomorrow. She will remain on 400 mg of amiodarone. After her pacemaker is completely stable we will make further decisions about how to approach her rhythm.

## 2012-05-16 NOTE — Assessment & Plan Note (Signed)
Today she is not in atrial fibrillation. She is continuing the new higher dose of amiodarone at 400 mg daily.

## 2012-05-16 NOTE — Progress Notes (Signed)
HPI   The patient is seen as an add-on today. Yesterday she was cardioverted. On May 14, 2012 she was seen in the office with return of her atrial fibrillation. She received 800 mg of amiodarone on September 10 and 800 mg of amiodarone on September 11. On September 12 she was cardioverted with one shock with 120 J. Her EKG after the cardioversion showed her usual small P waves. She went home and said she felt okay for a few hours. She then began to notice that when she was trying to move around she had marked shortness of breath. She did not have any PND or orthopnea. She continued to feel the same way today and came in to be seen. In the office her EKG reveals a rate of approximately 64. It is always difficult to see her P waves but I believe they usually can be seen. On today's EKG I do not see these little P waves. I suspect this is a junctional escape rhythm. We walked her in the hallway. She developed significant shortness of breath. Her O2 sat remained in the range of approximately 94%. She did not have any significant increase in heart rate with this walk.    Allergies  Allergen Reactions  . Meperidine Hcl   . Morphine   . Tetanus Toxoid     Current Outpatient Prescriptions  Medication Sig Dispense Refill  . acetaminophen (TYLENOL) 500 MG tablet Take 1,000 mg by mouth every 6 (six) hours as needed. For pain      . albuterol (PROVENTIL HFA;VENTOLIN HFA) 108 (90 BASE) MCG/ACT inhaler Inhale 2 puffs into the lungs every 6 (six) hours as needed. For shortness of breath      . amiodarone (PACERONE) 200 MG tablet Take 400 mg by mouth daily.      Marland Kitchen aspirin 81 MG tablet Take 81 mg by mouth daily.        Marland Kitchen atorvastatin (LIPITOR) 40 MG tablet Take 40 mg by mouth daily.      . calcium carbonate (OS-CAL) 600 MG TABS Take 600 mg by mouth daily.       . cholecalciferol (VITAMIN D) 1000 UNITS tablet Take 4,000 Units by mouth daily.      . fish oil-omega-3 fatty acids 1000 MG capsule Take 2 g  by mouth 2 (two) times daily.        Marland Kitchen FREESTYLE LITE test strip 1 each by Other route as needed.       . furosemide (LASIX) 40 MG tablet Take 20 mg by mouth daily.      . metFORMIN (GLUCOPHAGE) 500 MG tablet Take 500 mg by mouth 2 (two) times daily with a meal.      . potassium chloride SA (K-DUR,KLOR-CON) 20 MEQ tablet Take 2 tablets (40 mEq total) by mouth daily.  180 tablet  3  . trandolapril (MAVIK) 4 MG tablet Take 8 mg by mouth daily.       Marland Kitchen venlafaxine (EFFEXOR) 37.5 MG tablet Take 37.5 mg by mouth daily.       Marland Kitchen warfarin (COUMADIN) 5 MG tablet Take as directed by anticoagulation clinic  30 tablet  3  . DISCONTD: amiodarone (PACERONE) 200 MG tablet Take 1 tablet (200 mg total) by mouth daily. Once a day  90 tablet  2   No current facility-administered medications for this visit.   Facility-Administered Medications Ordered in Other Visits  Medication Dose Route Frequency Provider Last Rate Last Dose  . DISCONTD: 0.45 %  sodium chloride infusion   Intravenous Continuous [name], MD      . DISCONTD: 0.9 %  sodium chloride infusion   Intravenous Continuous [name], MD 20 mL/hr at 05/15/12 1242 500 mL at 05/15/12 1242    History   Social History  . Marital Status: Married    Spouse Name: N/A    Number of Children: N/A  . 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     Social History Main Topics  . Smoking status: Never Smoker   . Smokeless tobacco: Never Used  . Alcohol Use: No  . Drug Use: No  . Sexually Active: Not on file   Other Topics Concern  . Not on file   Social History Narrative   Does not et regular exerciseDaily caffeine use    Family History  Problem Relation Age of Onset  . Cancer Maternal Grandfather   . Heart disease Father   . Heart disease Sister   . Diabetes Mother   . Diabetes Father   . Diabetes Sister   . Diabetes Paternal Grandfather   . Diabetes Paternal Grandmother   . Heart  disease Paternal Uncle     Past Medical History  Diagnosis Date  . DM (diabetes mellitus)   . Atrial fibrillation     Treated with maze procedure / recurrent atrial fibrillation February, 2011... TEE cardioversion October 19, 2009  . Atrial tachycardia     February, 2011... Tikosyn started in hospital  . Bradycardia     Post cardioversion February, 2011,/digoxin/Cardizem metoprolol stopped, may need pacemaker  . Mitral regurgitation     Treated with repair, atrial appendage was removed or tied off at surgery.. this was proven by TEE February, 2011  . Pulmonary HTN     55 mmHg, echo, February, 2011 / no mention of pulmonary hypertension echo, June, 2011  . Right ventricular dysfunction     Mild to moderate, echo, February, 2011 / normalized echo, June, 2011  . TR (tricuspid regurgitation)     Moderate, echo, February, 2011 / trivial, echo, June, 2011  . Mitral stenosis     Mild, February, 2011, post mitral valve repair / mild, echo, June, 2011  . CAD (coronary artery disease)     LIMA to the LAD at time of mitral valve repair / LIMA atretic,, February, 2011  . Ejection fraction < 50%     EF 30% echo and TEE diagnosis February, 2011, possibly rate related / no contraction by catheter, bradycardia so carvedilol cannot be used / EF 55-60% echo, June, 2011 / EF 50%, echo, February, 2012  . HTN (hypertension)   . Hemorrhoids, internal   . Diverticulosis of colon   . Colon polyp, hyperplastic   . Environmental allergies   . atrial appendage     Removed were tied off at surgery, proven by TEE February, 2011  . S/P mitral valve repair     [facility] / Maze procedure/ atrial appendage removed were tied off  . Hx of CABG     At time of mitral valve repair  . Rash     ?Vicodin, Lotensin, Toprol  . CHF (congestive heart failure)     Mild in hospital February, 2011  . Drug therapy     Tikosyn,  stopped 02/10/2012  . Shortness of breath     Evaluated by Dr. Craige Cotta. Mat [id number]....  bronchodilator response,,  medication started  /  patient much improved June 14, 201 2  . QT prolongation     Tikosyn and Effexor. QT prolonged October 13, 2011, peak is in dose reduced from 500  to -250 twice a day  . H/O amiodarone therapy     Satrted 02/12/2012    Past Surgical History  Procedure Date  . Atrial appendage removed   . Mitral valve repair   . Coronary artery bypass graft   . Maze   . Vaginal hysterectomy   . Cholecystectomy   . Breast lumpectomy     x 2  . Cardioversion 02/08/2012    Procedure: CARDIOVERSION;  Surgeon: [name], MD;  Location: [facility] OR;  Service: Cardiovascular;  Laterality: N/A;  . Cardioversion 02/29/2012    Procedure: CARDIOVERSION;  Surgeon: [name], MD;  Location: [facility] OR;  Service: Cardiovascular;  Laterality: N/A;  . Cardioversion 05/15/2012    Procedure: CARDIOVERSION;  Surgeon: [name], MD;  Location: [facility] ENDOSCOPY;  Service: Cardiovascular;  Laterality: N/A;    ROS   Patient denies fever, chills, headache, sweats, rash, change in vision, change in hearing, chest pain, cough, nausea vomiting, urinary symptoms. All other systems are reviewed and are negative.  PHYSICAL EXAM   Patient is oriented to person time and place. Affect is normal. She's here with her husband. There is no jugular venous distention. Lungs are clear. Respiratory effort is not labored. Cardiac exam reveals S1 and S2. There no clicks or significant murmurs. The abdomen is soft. There is no peripheral edema.  Filed Vitals:   05/16/12 1642  BP: 156/62  Pulse: 67  Height: 5\' 3"  (1.6 m)  Weight: 179 lb (81.194 kg)   I have described EKGs above. I believe that today's EKG is junctional.  ASSESSMENT & PLAN

## 2012-05-17 ENCOUNTER — Encounter (HOSPITAL_COMMUNITY): Payer: Self-pay | Admitting: *Deleted

## 2012-05-17 ENCOUNTER — Encounter (HOSPITAL_COMMUNITY)
Admission: RE | Disposition: A | Discharge status: Home / Self Care | Payer: Self-pay | Source: Ambulatory Visit | Attending: Internal Medicine

## 2012-05-17 ENCOUNTER — Ambulatory Visit (HOSPITAL_COMMUNITY): Payer: BC Managed Care – PPO

## 2012-05-17 ENCOUNTER — Ambulatory Visit (HOSPITAL_COMMUNITY)
Admission: RE | Admit: 2012-05-17 | Discharge: 2012-05-18 | Disposition: A | Discharge status: Home / Self Care | Payer: BC Managed Care – PPO | Source: Ambulatory Visit | Attending: Internal Medicine | Admitting: Internal Medicine

## 2012-05-17 DIAGNOSIS — R001 Bradycardia, unspecified: Secondary | ICD-10-CM

## 2012-05-17 DIAGNOSIS — I495 Sick sinus syndrome: Secondary | ICD-10-CM | POA: Insufficient documentation

## 2012-05-17 DIAGNOSIS — I4891 Unspecified atrial fibrillation: Secondary | ICD-10-CM | POA: Diagnosis present

## 2012-05-17 DIAGNOSIS — E119 Type 2 diabetes mellitus without complications: Secondary | ICD-10-CM | POA: Insufficient documentation

## 2012-05-17 DIAGNOSIS — I498 Other specified cardiac arrhythmias: Secondary | ICD-10-CM

## 2012-05-17 DIAGNOSIS — I10 Essential (primary) hypertension: Secondary | ICD-10-CM | POA: Insufficient documentation

## 2012-05-17 HISTORY — DX: Sick sinus syndrome: I49.5

## 2012-05-17 HISTORY — DX: Other specified postprocedural states: R11.2

## 2012-05-17 HISTORY — DX: Unspecified osteoarthritis, unspecified site: M19.90

## 2012-05-17 HISTORY — DX: Nausea with vomiting, unspecified: R11.2

## 2012-05-17 HISTORY — DX: Nausea with vomiting, unspecified: Z98.890

## 2012-05-17 HISTORY — PX: PERMANENT PACEMAKER INSERTION: SHX5480

## 2012-05-17 LAB — SURGICAL PCR SCREEN: MRSA, PCR: NEGATIVE

## 2012-05-17 LAB — GLUCOSE, CAPILLARY
Glucose-Capillary: 114 mg/dL — ABNORMAL HIGH (ref 70–99)
Glucose-Capillary: 141 mg/dL — ABNORMAL HIGH (ref 70–99)

## 2012-05-17 LAB — PROTIME-INR: INR: 3.24 — ABNORMAL HIGH (ref 0.00–1.49)

## 2012-05-17 SURGERY — PERMANENT PACEMAKER INSERTION
Anesthesia: LOCAL

## 2012-05-17 MED ORDER — PHYTONADIONE 5 MG PO TABS
5.0000 mg | ORAL_TABLET | ORAL | Status: AC
  Administered 2012-05-17: 5 mg via ORAL
  Filled 2012-05-17: qty 1

## 2012-05-17 MED ORDER — SODIUM CHLORIDE 0.9 % IV SOLN: 250.0000 mL | INTRAVENOUS | Status: DC

## 2012-05-17 MED ORDER — ACETAMINOPHEN 325 MG PO TABS
325.0000 mg | ORAL_TABLET | ORAL | Status: DC | PRN
  Administered 2012-05-17: 19:00:00 650 mg via ORAL
  Filled 2012-05-17: qty 2

## 2012-05-17 MED ORDER — YOU HAVE A PACEMAKER BOOK
Freq: Once | Status: AC
  Administered 2012-05-17: 16:00:00
  Filled 2012-05-17: qty 1

## 2012-05-17 MED ORDER — SODIUM CHLORIDE 0.9 % IJ SOLN
3.0000 mL | Freq: Two times a day (BID) | INTRAMUSCULAR | Status: DC
  Administered 2012-05-17: 3 mL via INTRAVENOUS

## 2012-05-17 MED ORDER — AMIODARONE HCL 200 MG PO TABS
200.0000 mg | ORAL_TABLET | Freq: Once | ORAL | Status: AC
  Filled 2012-05-17: qty 1

## 2012-05-17 MED ORDER — MUPIROCIN 2 % EX OINT
TOPICAL_OINTMENT | Freq: Once | CUTANEOUS | Status: AC
  Administered 2012-05-17: 1 via NASAL
  Filled 2012-05-17: qty 22

## 2012-05-17 MED ORDER — AMIODARONE HCL 200 MG PO TABS
400.0000 mg | ORAL_TABLET | Freq: Every day | ORAL | Status: DC
  Filled 2012-05-17 (×2): qty 2

## 2012-05-17 MED ORDER — FUROSEMIDE 20 MG PO TABS
20.0000 mg | ORAL_TABLET | Freq: Every day | ORAL | Status: DC
  Administered 2012-05-17: 16:00:00 20 mg via ORAL
  Filled 2012-05-17 (×2): qty 1

## 2012-05-17 MED ORDER — SODIUM CHLORIDE 0.9 % IV SOLN: 250.0000 mL | INTRAVENOUS | Status: DC | PRN

## 2012-05-17 MED ORDER — MUPIROCIN 2 % EX OINT
TOPICAL_OINTMENT | CUTANEOUS | Status: AC
  Administered 2012-05-17: 1 via NASAL
  Filled 2012-05-17: qty 22

## 2012-05-17 MED ORDER — HEPARIN (PORCINE) IN NACL 2-0.9 UNIT/ML-% IJ SOLN
INTRAMUSCULAR | Status: AC
  Filled 2012-05-17: qty 500

## 2012-05-17 MED ORDER — CALCIUM CARBONATE ANTACID 500 MG PO CHEW
1.0000 | CHEWABLE_TABLET | Freq: Every day | ORAL | Status: DC
  Administered 2012-05-17: 200 mg via ORAL
  Filled 2012-05-17 (×2): qty 1

## 2012-05-17 MED ORDER — SODIUM CHLORIDE 0.9 % IJ SOLN: 3.0000 mL | INTRAMUSCULAR | Status: DC | PRN

## 2012-05-17 MED ORDER — SODIUM CHLORIDE 0.45 % IV SOLN
INTRAVENOUS | Status: DC
  Administered 2012-05-17: 1000 mL via INTRAVENOUS

## 2012-05-17 MED ORDER — AMIODARONE HCL 200 MG PO TABS
200.0000 mg | ORAL_TABLET | Freq: Once | ORAL | Status: AC
  Administered 2012-05-17: 200 mg via ORAL
  Filled 2012-05-17: qty 1

## 2012-05-17 MED ORDER — SODIUM CHLORIDE 0.9 % IJ SOLN: 3.0000 mL | Freq: Two times a day (BID) | INTRAMUSCULAR | Status: DC

## 2012-05-17 MED ORDER — FENTANYL CITRATE 0.05 MG/ML IJ SOLN
INTRAMUSCULAR | Status: AC
  Filled 2012-05-17: qty 2

## 2012-05-17 MED ORDER — PHYTONADIONE 1 MG/0.5 ML ORAL SOLUTION: 1.0000 mg | Freq: Once | ORAL | Status: DC

## 2012-05-17 MED ORDER — POTASSIUM CHLORIDE CRYS ER 20 MEQ PO TBCR
40.0000 meq | EXTENDED_RELEASE_TABLET | Freq: Every day | ORAL | Status: DC
  Administered 2012-05-17: 40 meq via ORAL
  Filled 2012-05-17 (×2): qty 2

## 2012-05-17 MED ORDER — HYDROCODONE-ACETAMINOPHEN 5-325 MG PO TABS: 1.0000 | ORAL_TABLET | ORAL | Status: DC | PRN

## 2012-05-17 MED ORDER — ONDANSETRON HCL 4 MG/2ML IJ SOLN: 4.0000 mg | Freq: Four times a day (QID) | INTRAMUSCULAR | Status: DC | PRN

## 2012-05-17 MED ORDER — SODIUM CHLORIDE 0.9 % IR SOLN
80.0000 mg | Status: DC
  Filled 2012-05-17: qty 2

## 2012-05-17 MED ORDER — MIDAZOLAM HCL 5 MG/5ML IJ SOLN
INTRAMUSCULAR | Status: AC
  Filled 2012-05-17: qty 5

## 2012-05-17 MED ORDER — CEFAZOLIN SODIUM-DEXTROSE 2-3 GM-% IV SOLR
2.0000 g | INTRAVENOUS | Status: DC
  Filled 2012-05-17 (×2): qty 50

## 2012-05-17 MED ORDER — TRANDOLAPRIL 4 MG PO TABS
8.0000 mg | ORAL_TABLET | Freq: Every day | ORAL | Status: DC
  Filled 2012-05-17 (×2): qty 2

## 2012-05-17 MED ORDER — LIDOCAINE HCL (PF) 1 % IJ SOLN
INTRAMUSCULAR | Status: AC
  Filled 2012-05-17: qty 30

## 2012-05-17 MED ORDER — ATORVASTATIN CALCIUM 40 MG PO TABS
40.0000 mg | ORAL_TABLET | Freq: Every day | ORAL | Status: DC
  Administered 2012-05-17: 40 mg via ORAL
  Filled 2012-05-17 (×2): qty 1

## 2012-05-17 MED ORDER — CEFAZOLIN SODIUM 1-5 GM-% IV SOLN
1.0000 g | Freq: Four times a day (QID) | INTRAVENOUS | Status: AC
  Administered 2012-05-17 – 2012-05-18 (×3): 1 g via INTRAVENOUS
  Filled 2012-05-17 (×3): qty 50

## 2012-05-17 MED ORDER — CHLORHEXIDINE GLUCONATE 4 % EX LIQD
60.0000 mL | Freq: Once | CUTANEOUS | Status: DC
  Filled 2012-05-17: qty 60

## 2012-05-17 MED ORDER — LIDOCAINE HCL (PF) 1 % IJ SOLN
INTRAMUSCULAR | Status: AC
  Filled 2012-05-17: qty 60

## 2012-05-17 MED ORDER — PHYTONADIONE 1 MG/0.5 ML ORAL SOLUTION
1.0000 mg | ORAL | Status: DC
  Filled 2012-05-17: qty 0.5

## 2012-05-17 MED ORDER — CALCIUM CARBONATE 600 MG PO TABS
600.0000 mg | ORAL_TABLET | Freq: Every day | ORAL | Status: DC
  Filled 2012-05-17: qty 1

## 2012-05-17 MED ORDER — VENLAFAXINE HCL 37.5 MG PO TABS
37.5000 mg | ORAL_TABLET | Freq: Every day | ORAL | Status: DC
  Administered 2012-05-17: 37.5 mg via ORAL
  Filled 2012-05-17 (×2): qty 1

## 2012-05-17 NOTE — Op Note (Signed)
SURGEON:  Hillis Range, MD     PREPROCEDURE DIAGNOSIS:  Sick sinus syndrome, Persistent atrial fibrillation    POSTPROCEDURE DIAGNOSIS:  Sick sinus syndrome, Persistent atrial fibrillation     PROCEDURES:   1. Left upper extremity venography.   2. Pacemaker implantation.     INTRODUCTION: Anita Duke is a 71 y.o. female  with a history of bradycardia who presents today for pacemaker implantation.  The patient reports intermittent episodes of dizziness and fatigue over the past few days.  She has a history of atrial fibrillation with rapid ventricular rates and is felt to require medical therapy long term.The patient therefore presents today for pacemaker implantation.     DESCRIPTION OF PROCEDURE:  Informed written consent was obtained, and   the patient was brought to the electrophysiology lab in a fasting state.  The patient IV Versed and Fentanyl as sedation for the procedure today.  The patients left chest was prepped and draped in the usual sterile fashion by the EP lab staff. The skin overlying the left deltopectoral region was infiltrated with lidocaine for local analgesia.  A 4-cm incision was made over the left deltopectoral region.  A left subcutaneous pacemaker pocket was fashioned using a combination of sharp and blunt dissection. Electrocautery was required to assure hemostasis.    Left Upper Extremity Venography: A venogram of the left upper extremity was performed, which revealed a moderate sized left cephalic vein, which emptied into a large left subclavian vein.  The left axillary vein was moderate in size and converged from two separate branches.    RA/RV Lead Placement: The left axillary vein was cannulated.  Through the left axillary vein, a Medtronic model [id number] (serial number [id number]) right atrial lead and a Medtronic model 5092- 58 (serial number [id number]) right ventricular lead were advanced with fluoroscopic visualization into the lateral right atrium and  right ventricular apex positions respectively. The right atrium was very large fluoroscopically and atrial lead placement was a little more difficult that usual.  Mapping revealed that appropriate pacing/ sensing required atrial lead placement along the right atrial free wall.  Initial atrial lead P- waves measured 2.5 mV with impedance of 722 ohms and a threshold of 1.4 V at 0.5 msec.  Right ventricular lead R-waves measured 9.98mV with an impedance of 504 ohms and a threshold of 0.6 V at 0.5 msec.  Both leads   were secured to the pectoralis fascia using #2-0 silk over the suture sleeves.   Device Placement:  The leads were then connected to a Medtronic Adapta L model ADDRL 1 (serial number [id number]) pacemaker.  The pocket was irrigated with copious gentamicin solution.  The pacemaker was then placed into the pocket.  The pocket was then closed in 2 layers with 2.0 Vicryl suture for the subcutaneous and subcuticular layers.  Steri- Strips and a sterile dressing were then applied.  There were no early apparent complications.     CONCLUSIONS:   1. Successful implantation of a Medtronic Adapta L dual-chamber pacemaker for symptomatic bradycardia/ sick sinus syndrome  2. No early apparent complications.           Hillis Range, MD 05/17/2012 1:52 PM

## 2012-05-17 NOTE — H&P (Signed)
ELECTROPHYSIOLOGY HISTORY & PHYSICAL  Patient ID: Anita Duke MRN: [id number], DOB/AGE: 04-Nov-1940   Admit date: 05/17/2012 Date of Consult: 05/17/2012  Primary Physician: Lucky Cowboy, MD  Primary Cardiologist: Myrtis Ser, MD Reason for Consultation: Bradycardia  History of Present Illness Anita Duke is a pleasant 71 year old woman with AFib s/p MAZE 2011 and multiple DCCVs who was recently cardioverted on 05/15/2012 and presented yesterday in follow-up to see Dr. Myrtis Ser for DOE. She received amiodarone prior to her DCCV which was successful for restoring sinus rhythm with 120J shock x1; however, a few hours after she arrived home following the cardioversion she began to notice exertional SOB. She denies CP, palpitations, dizziness, near syncope or syncope. During her visit with Dr. Myrtis Ser yesterday, he was concerned she may be in a junctional rhythm with probable chronotropic incompetence as her heart rate did not increase when they had her walk around our office. Of note, she has a history of bradycardia for which digoxin, metoprolol and diltiazem were discontinued previously in 2011.    Past Medical History Past Medical History  Diagnosis Date  . DM (diabetes mellitus)   . Atrial fibrillation     Treated with maze procedure / recurrent atrial fibrillation February, 2011... TEE cardioversion October 19, 2009  . Atrial tachycardia     February, 2011... Tikosyn started in hospital  . Bradycardia     Post cardioversion February, 2011,/digoxin/Cardizem metoprolol stopped, may need pacemaker  . Mitral regurgitation     Treated with repair, atrial appendage was removed or tied off at surgery.. this was proven by TEE February, 2011  . Pulmonary HTN     55 mmHg, echo, February, 2011 / no mention of pulmonary hypertension echo, June, 2011  . Right ventricular dysfunction     Mild to moderate, echo, February, 2011 / normalized echo, June, 2011  . TR (tricuspid regurgitation)     Moderate, echo,  February, 2011 / trivial, echo, June, 2011  . Mitral stenosis     Mild, February, 2011, post mitral valve repair / mild, echo, June, 2011  . CAD (coronary artery disease)     LIMA to the LAD at time of mitral valve repair / LIMA atretic,, February, 2011  . Ejection fraction < 50%     EF 30% echo and TEE diagnosis February, 2011, possibly rate related / no contraction by catheter, bradycardia so carvedilol cannot be used / EF 55-60% echo, June, 2011 / EF 50%, echo, February, 2012  . HTN (hypertension)   . Hemorrhoids, internal   . Diverticulosis of colon   . Colon polyp, hyperplastic   . Environmental allergies   . atrial appendage     Removed were tied off at surgery, proven by TEE February, 2011  . S/P mitral valve repair     [facility] / Maze procedure/ atrial appendage removed were tied off  . Hx of CABG     At time of mitral valve repair  . Rash     ?Vicodin, Lotensin, Toprol  . CHF (congestive heart failure)     Mild in hospital February, 2011  . Drug therapy     Tikosyn,  stopped 02/10/2012  . Shortness of breath     Evaluated by Dr. Craige Cotta. Mat [id number].... bronchodilator response,,  medication started  /  patient much improved June 14, 201 2  . QT prolongation     Tikosyn and Effexor. QT prolonged October 13, 2011, peak is in dose reduced from 500  to -250  twice a day  . H/O amiodarone therapy     Satrted 02/12/2012    Past Surgical History Past Surgical History  Procedure Date  . Atrial appendage removed   . Mitral valve repair   . Coronary artery bypass graft   . Maze   . Vaginal hysterectomy   . Cholecystectomy   . Breast lumpectomy     x 2  . Cardioversion 02/08/2012    Procedure: CARDIOVERSION;  Surgeon: [name], MD;  Location[facility] OR;  Service: Cardiovascular;  Laterality: N/A;  . Cardioversion 02/29/2012    Procedure: CARDIOVERSION;  Surgeon: [name], MD;  Location: [facility] OR;  Service: Cardiovascular;  Laterality: N/A;  . Cardioversion 05/15/2012     Procedure: CARDIOVERSION;  Surgeon: [name], MD;  Location: [facility] ENDOSCOPY;  Service: Cardiovascular;  Laterality: N/A;     Allergies/Intolerances Allergies  Allergen Reactions  . Meperidine Hcl   . Morphine   . Tetanus Toxoid     Inpatient Medications . ceFAZolin (ANCEF) IV  2 g Intravenous On Call  . chlorhexidine  60 mL Topical Once  . gentamicin irrigation  80 mg Irrigation On Call  . mupirocin ointment   Nasal Once   Family History Positive for CAD, HTN and DM   Social History Social History  . 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 Smoking status: Never Smoker   . Smokeless tobacco: Never Used  . Alcohol Use: No  . Drug Use: No   Social History Narrative   Does not exerciseDaily caffeine use    Review of Systems General: No chills, fever, night sweats or weight changes  Cardiovascular: No chest pain, dyspnea on exertion, edema, orthopnea, palpitations, paroxysmal nocturnal dyspnea Dermatological: No rash, lesions or masses Respiratory: No cough, dyspnea Urologic: No hematuria, dysuria Abdominal: No nausea, vomiting, diarrhea, bright red blood per rectum, melena, or hematemesis Neurologic: No visual changes, weakness, changes in mental status All other systems reviewed and are otherwise negative except as noted above.  Physical Exam Blood pressure 164/69, pulse 62, temperature 97.3 F (36.3 C), temperature source Oral, resp. rate 18, height 5\' 3"  (1.6 m), weight 179 lb (81.194 kg), SpO2 95.00%.  General: Well developed, well appearing 71 year old female in no acute distress. HEENT: Normocephalic, atraumatic. EOMs intact. Sclera nonicteric. Oropharynx clear.  Neck: Supple without bruits. No JVD. Lungs: Respirations regular and unlabored, CTA bilaterally. No wheezes, rales or rhonchi. Heart: RRR. S1, S2 present. No murmurs, rub, S3 or S4. Abdomen: Soft, non-tender, non-distended.  BS present x 4 quadrants. No hepatosplenomegaly.  Extremities: No clubbing, cyanosis or edema. DP/PT/Radials 2+ and equal bilaterally. Psych: Normal affect. Neuro: Alert and oriented X 3. Moves all extremities spontaneously. Musculoskeletal: No kyphosis. Skin: Intact. Warm and dry. No rashes or petechiae in exposed areas.   Labs Lab Results  Component Value Date   WBC 9.9 05/13/2012   HGB 11.9* 05/13/2012   HCT 36.5 05/13/2012   MCV 87.3 05/13/2012   PLT 244.0 05/13/2012     Lab 05/13/12 1151  NA 139  K 3.5  CL 104  CO2 26  BUN 10  CREATININE 0.5  CALCIUM 8.9  PROT --  BILITOT --  ALKPHOS --  ALT --  AST --  GLUCOSE 135*   No components found with this basename: MAGNESIUM No components found with this basename: POCBNP:3 No results found for this basename: TSH,T4TOTAL,FREET3,T3FREE,THYROIDAB  in the last 72 hours   Basename 05/17/12 0930  INR 3.24*    Radiology/Studies Dg Chest 2 View  05/13/2012  *RADIOLOGY REPORT*  Clinical Data: Shortness of breath  CHEST - 2 VIEW  Comparison: 10/06/2010  Findings: Cardiomegaly again noted.  Status post median sternotomy. No acute infiltrate or pleural effusion.  No pulmonary edema.  Bony thorax is stable.  Stable bilateral basilar crowding of the bronchovascular markings.  IMPRESSION: No active disease.  No significant change.  Cardiomegaly again noted.  Status post median sternotomy.   Original Report Authenticated By: Natasha Mead, M.D.    Echocardiogram March 2013  - Left ventricle: The cavity size was normal. There was mild concentric hypertrophy. Systolic function was normal. The estimated ejection fraction was in the range of 50% to 55%. Wall motion was normal; there were no regional wall motion abnormalities. Doppler parameters are consistent with abnormal left ventricular relaxation (grade 1 diastolic dysfunction). - Mitral valve: MAC and ? annuloplasty ring. Small diastolic gradient but valve area ok by Pt1/2 An annular ring prosthesis was  present. Mild regurgitation. Valve area by pressure half-time: 2.44cm^2. Valve area by continuity equation (using LVOT flow): 1.79cm^2. - Left atrium: The atrium was moderately to severely dilated. 56 mm. - Right atrium: The atrium was moderately to severely dilated. - Pulmonary arteries: Systolic pressure was mildly increased. PA peak pressure: 42mm Hg (S).  12-lead ECG yesterday from our office show an accelerated junctional rhythm at 63 bpm;   Assessment and Plan 1. Junctional rhythm Anita Duke is in a junctional rhythm with symptomatic chronotropic incompetence. She has also had documented bradycardia in the past prompting discontinuation of her rate controlling medications, including digoxin, metoprolol and diltiazem. In addition, she has paroxysmal AFib despite MAZE procedure and treatment with AAD therapy. Therefore, we recommend PPM implantation. Risks, benefits and alternatives to PPM implantation were discussed in detail with Anita Duke today. These risks include, but are not limited to, bleeding, infection, pneumothorax, perforation, tamponade, vascular damage, renal failure, lead dislodgement, MI, stroke and death. She expressed verbal understanding and agrees to proceed with PPM implantation. We will give a single dose of vitamin K now.  The patient was seen, examined and plan of care formulated with Dr. Hillis Range. Signed, Rick Duff, PA-C 05/17/2012, 10:28 AM  I have seen, examined the patient, and reviewed the above assessment and plan.  Changes to above are made where necessary.  Pt with longstanding valvular heart disease.  She is s/p surgical maze and LAA ligation.  She now presents with clearly symptomatic bradycardia.  Previously, she has had difficulty with tachycardia.  She will require amiodarone and AV nodal agents long term in our efforts to maintain sinus rhythm. I would therefore recommend pacemaker implantation at this time.  Risks, benefits, alternatives to  pacemaker implantation were discussed in detail with the patient today. The patient understands that the risks include but are not limited to bleeding, infection, pneumothorax, perforation, tamponade, vascular damage, renal failure, MI, stroke, death,  and lead dislodgement and wishes to proceed. We will therefore proceed with PPM at this time. CXR is reviewed. Vitamin K given for reversal of INR prior to the procedure.  I have spoken with Dr Myrtis Ser and he and I agree that given her prior LAA ligation that reversal of her INR with coumadin is reasonable.   Co Sign: Hillis Range, MD 05/17/2012 11:12 AM

## 2012-05-18 ENCOUNTER — Encounter (HOSPITAL_COMMUNITY): Payer: Self-pay | Admitting: Cardiology

## 2012-05-18 ENCOUNTER — Ambulatory Visit (HOSPITAL_COMMUNITY): Payer: BC Managed Care – PPO

## 2012-05-18 LAB — PROTIME-INR: INR: 2.14 — ABNORMAL HIGH (ref 0.00–1.49)

## 2012-05-18 NOTE — Discharge Summary (Signed)
Discharge Summary   Patient ID: Anita Duke MRN: [id number], DOB/AGE: 13-Jul-1941 71 y.o.  Primary MD: Anita Corwin, MD Primary Cardiologist: Dr. Katz/Dr. Johney Duke (EP) Admit date: 05/17/2012 D/C date:     05/18/2012      Primary Discharge Diagnoses:  1. Symptomatic Bradycardia/Sick Sinus Syndrome  - s/p Medtronic dual chamber PPM 05/17/12  Secondary Discharge Diagnoses:  . DM (diabetes mellitus)   . Atrial fibrillation     Treated with maze procedure / recurrent atrial fibrillation February, 2011... TEE cardioversion October 19, 2009  . Atrial tachycardia     February, 2011... Tikosyn started in hospital  . Bradycardia     Post cardioversion February, 2011,/digoxin/Cardizem metoprolol stopped, may need pacemaker  . Mitral regurgitation     Treated with repair, atrial appendage was removed or tied off at surgery.. this was proven by TEE February, 2011  . Pulmonary HTN     55 mmHg, echo, February, 2011 / no mention of pulmonary hypertension echo, June, 2011  . Right ventricular dysfunction     Mild to moderate, echo, February, 2011 / normalized echo, June, 2011  . TR (tricuspid regurgitation)     Moderate, echo, February, 2011 / trivial, echo, June, 2011  . Mitral stenosis     Mild, February, 2011, post mitral valve repair / mild, echo, June, 2011  . CAD (coronary artery disease)     LIMA to the LAD at time of mitral valve repair / LIMA atretic,, February, 2011  . Ejection fraction < 50%     EF 30% echo and TEE diagnosis February, 2011, possibly rate related / no contraction by catheter, bradycardia so carvedilol cannot be used / EF 55-60% echo, June, 2011 / EF 50%, echo, February, 2012  . HTN (hypertension)   . Hemorrhoids, internal   . Diverticulosis of colon   . Colon polyp, hyperplastic   . Environmental allergies   . atrial appendage     Removed were tied off at surgery, proven by TEE February, 2011  . S/P mitral valve repair     [facility] / Maze  procedure/ atrial appendage removed were tied off  . Hx of CABG     At time of mitral valve repair  . Rash     ?Vicodin, Lotensin, Toprol  . CHF (congestive heart failure)     Mild in hospital February, 2011  . Drug therapy     Tikosyn,  stopped 02/10/2012  . Shortness of breath     Evaluated by Dr. Craige Duke. Anita Duke [id number].... bronchodilator response,,  medication started  /  patient much improved June 14, 201 2  . QT prolongation     Tikosyn and Effexor. QT prolonged October 13, 2011, peak is in dose reduced from 500  to -250 twice a day  . H/O amiodarone therapy     Satrted 02/12/2012  . PONV (postoperative nausea and vomiting)   . Anginal pain   . Pacemaker   . Arthritis   . Sick sinus syndrome     s/p Medtronic dual chamber PPM 05/17/12   Allergies Allergies  Allergen Reactions  . Meperidine Hcl     unknown  . Morphine     vomiting  . Tetanus Toxoid     rash    Diagnostic Studies/Procedures:   05/17/12 - Pacemaker Implantation - Medtronic model [id number] (serial number [id number]) right atrial lead - Medtronic model [id number] (serial number [id number]) right ventricular lead  - Medtronic Adapta L model ADDRL 1 (  serial number [id number]) pacemaker   History of Present Illness: 71 y.o. female w/ the above medical problems who presented to [facility] on 05/17/12 for placement of permanent pacemaker in the setting of symptomatic bradycardia. She has a history of paroxysmal atrial fibrillation despite MAZE, multiple DCCVs, and antiarhythmic therapy. In an effort to maintain sinus rhythm she has needed amiodarone and AV nodal agents which have resulted in symptomatic bradycardia and thus the need for pacemaker implantation.  Hospital Course: [name] presented to [facility] in stable condition. Her INR was supratherapeutic at 3.24 for which she was reversed with Vit K+. She underwent successful placement of a Medtronic Adapta L dual-chamber pacemaker on 05/17/12. She  tolerated the procedure well without complications and was observed overnight. Postop CXR was without pneumothorax or other acute findings. INR on morning of discharge was 2.14 for which she was maintained on her previous home dosing of coumadin (2.5mg  daily) and instructed to have INR follow up on 9/17. She was seen and evaluated by Dr. Johney Duke who felt she was stable for discharge home with plans for follow up as scheduled below.  Discharge Vitals: Blood pressure 158/89, pulse 61, temperature 98.1 F (36.7 C), temperature source Oral, resp. rate 17, height 5\' 3"  (1.6 m), weight 179 lb 7.3 oz (81.4 kg), SpO2 95.00%.  Labs:    05/18/2012 06:48  Prothrombin Time 24.3 (H)  INR 2.14 (H)    Discharge Medications     Medication List     As of 05/18/2012  8:17 AM    TAKE these medications         acetaminophen 500 MG tablet   Commonly known as: TYLENOL   Take 1,000 mg by mouth every 6 (six) hours as needed. For pain      albuterol 108 (90 BASE) MCG/ACT inhaler   Commonly known as: PROVENTIL HFA;VENTOLIN HFA   Inhale 2 puffs into the lungs every 6 (six) hours as needed. For shortness of breath      amiodarone 200 MG tablet   Commonly known as: PACERONE   Take 400 mg by mouth daily.      aspirin 81 MG tablet   Take 81 mg by mouth daily.      atorvastatin 40 MG tablet   Commonly known as: LIPITOR   Take 40 mg by mouth daily.      calcium carbonate 600 MG Tabs   Commonly known as: OS-CAL   Take 600 mg by mouth daily.      cholecalciferol 1000 UNITS tablet   Commonly known as: VITAMIN D   Take 4,000 Units by mouth daily.      fish oil-omega-3 fatty acids 1000 MG capsule   Take 2 g by mouth 2 (two) times daily.      furosemide 40 MG tablet   Commonly known as: LASIX   Take 20 mg by mouth daily.      metFORMIN 500 MG tablet   Commonly known as: GLUCOPHAGE   Take 500 mg by mouth 2 (two) times daily with a meal.      potassium chloride SA 20 MEQ tablet   Commonly known as:  K-DUR,KLOR-CON   Take 2 tablets (40 mEq total) by mouth daily.      trandolapril 4 MG tablet   Commonly known as: MAVIK   Take 8 mg by mouth daily.      venlafaxine 37.5 MG tablet   Commonly known as: EFFEXOR   Take 37.5 mg by  mouth daily.      warfarin 2.5 MG tablet   Commonly known as: COUMADIN   Take 2.5 mg by mouth daily.       Disposition   Discharge Orders    Future Appointments: Provider: Department: Dept Phone: Center:   06/06/2012 11:15 AM [name], MD [facility] [phone] [facility]     Future Orders Please Complete By Expires   Diet - low sodium heart healthy      Increase activity slowly      Discharge instructions      Comments:   * Please resume your previous home dosing of coumadin (2.5mg  daily) and have your INR checked on Tuesday 9/17 for further instructions on dosing.  *PLEASE REMEMBER TO BRING ALL OF YOUR MEDICATIONS TO EACH OF YOUR FOLLOW-UP OFFICE VISITS.     Follow-up Information    Follow up with [facility]. On 05/21/2012. (Our office will call you with your appointment time)    Contact information:   [facility] [address] [place] [address] [phone]      Follow up with [facility]. (Our office will call you with your appointment date/time for your device check in 7-10 days)    Contact information:   [facility] [address] [place] [address] [phone]       Follow up with Hillis Range, MD. In 3 months. (Our office will call you with your appointment date/time)    Contact information:   [facility] [address] [place] [address] [phone]          Outstanding Labs/Studies:  1. INR  Duration of Discharge Encounter: Greater than 30 minutes including physician and PA time.  Signed, HOPE, JESSICA PA-C 05/18/2012, 8:17 AM  Hillis Range, MD

## 2012-05-20 ENCOUNTER — Encounter: Payer: Self-pay | Admitting: *Deleted

## 2012-05-20 DIAGNOSIS — Z950 Presence of cardiac pacemaker: Secondary | ICD-10-CM | POA: Insufficient documentation

## 2012-05-20 HISTORY — DX: Presence of cardiac pacemaker: Z95.0

## 2012-05-22 ENCOUNTER — Encounter: Payer: Self-pay | Admitting: Internal Medicine

## 2012-05-23 ENCOUNTER — Ambulatory Visit (INDEPENDENT_AMBULATORY_CARE_PROVIDER_SITE_OTHER): Payer: BC Managed Care – PPO | Admitting: *Deleted

## 2012-05-23 ENCOUNTER — Encounter: Payer: Self-pay | Admitting: Internal Medicine

## 2012-05-23 ENCOUNTER — Encounter: Payer: Self-pay | Admitting: *Deleted

## 2012-05-23 ENCOUNTER — Other Ambulatory Visit: Payer: Self-pay | Admitting: *Deleted

## 2012-05-23 DIAGNOSIS — I4891 Unspecified atrial fibrillation: Secondary | ICD-10-CM

## 2012-05-23 DIAGNOSIS — I471 Supraventricular tachycardia: Secondary | ICD-10-CM

## 2012-05-23 DIAGNOSIS — Z7901 Long term (current) use of anticoagulants: Secondary | ICD-10-CM

## 2012-05-23 DIAGNOSIS — Z8679 Personal history of other diseases of the circulatory system: Secondary | ICD-10-CM

## 2012-05-23 DIAGNOSIS — I498 Other specified cardiac arrhythmias: Secondary | ICD-10-CM

## 2012-05-23 DIAGNOSIS — R001 Bradycardia, unspecified: Secondary | ICD-10-CM

## 2012-05-23 LAB — PACEMAKER DEVICE OBSERVATION
AL AMPLITUDE: 2.8 mv
BAMS-0001: 150 {beats}/min
RV LEAD AMPLITUDE: 22.4 mv
RV LEAD IMPEDENCE PM: 593 Ohm
VENTRICULAR PACING PM: 1

## 2012-05-23 LAB — CBC WITH DIFFERENTIAL/PLATELET
Basophils Absolute: 0.1 10*3/uL (ref 0.0–0.1)
Basophils Relative: 1.1 % (ref 0.0–3.0)
Eosinophils Absolute: 0.1 10*3/uL (ref 0.0–0.7)
MCHC: 32.5 g/dL (ref 30.0–36.0)
MCV: 87.3 fl (ref 78.0–100.0)
Monocytes Absolute: 0.7 10*3/uL (ref 0.1–1.0)
Neutrophils Relative %: 63.9 % (ref 43.0–77.0)
Platelets: 355 10*3/uL (ref 150.0–400.0)
RDW: 15.8 % — ABNORMAL HIGH (ref 11.5–14.6)

## 2012-05-23 LAB — BASIC METABOLIC PANEL
BUN: 15 mg/dL (ref 6–23)
CO2: 30 mEq/L (ref 19–32)
Calcium: 8.9 mg/dL (ref 8.4–10.5)
Chloride: 101 mEq/L (ref 96–112)
Creatinine, Ser: 0.7 mg/dL (ref 0.4–1.2)

## 2012-05-23 LAB — POCT INR: INR: 2.5

## 2012-05-23 NOTE — Progress Notes (Signed)
Wound check-PPM 

## 2012-05-24 ENCOUNTER — Telehealth: Payer: Self-pay | Admitting: Cardiology

## 2012-05-24 ENCOUNTER — Ambulatory Visit: Payer: BC Managed Care – PPO

## 2012-05-24 NOTE — Telephone Encounter (Signed)
Follow-up:    Patient returned your call.  Please call back. 

## 2012-05-24 NOTE — Telephone Encounter (Signed)
New Problem: ° ° ° °Patient returned your call.  Please call back. °

## 2012-05-24 NOTE — Telephone Encounter (Signed)
N/A.  LMTC. 

## 2012-05-24 NOTE — Telephone Encounter (Signed)
Called pt to find out how she is doing.

## 2012-05-27 ENCOUNTER — Telehealth: Payer: Self-pay | Admitting: Cardiology

## 2012-05-27 ENCOUNTER — Telehealth: Payer: Self-pay | Admitting: Internal Medicine

## 2012-05-27 NOTE — Telephone Encounter (Signed)
[employer] paper for [employer] was Completed By Melina Copa pt she is aware papers ready, faxed to [phone] Mailed Original to Pt per Her Request 05/27/12/KM

## 2012-05-27 NOTE — Telephone Encounter (Signed)
Pt having procedure tomorrow, has questions, pls call

## 2012-05-27 NOTE — Telephone Encounter (Signed)
lmom for patient to return my call 

## 2012-05-28 ENCOUNTER — Encounter (HOSPITAL_COMMUNITY)
Admission: RE | Disposition: A | Discharge status: Home / Self Care | Payer: Self-pay | Source: Ambulatory Visit | Attending: Internal Medicine

## 2012-05-28 ENCOUNTER — Ambulatory Visit (HOSPITAL_COMMUNITY)
Admission: RE | Admit: 2012-05-28 | Discharge: 2012-05-29 | Disposition: A | Discharge status: Home / Self Care | Payer: BC Managed Care – PPO | Source: Ambulatory Visit | Attending: Internal Medicine | Admitting: Internal Medicine

## 2012-05-28 ENCOUNTER — Encounter (HOSPITAL_COMMUNITY): Payer: Self-pay | Admitting: General Practice

## 2012-05-28 DIAGNOSIS — E119 Type 2 diabetes mellitus without complications: Secondary | ICD-10-CM | POA: Insufficient documentation

## 2012-05-28 DIAGNOSIS — I4891 Unspecified atrial fibrillation: Secondary | ICD-10-CM

## 2012-05-28 DIAGNOSIS — Z950 Presence of cardiac pacemaker: Secondary | ICD-10-CM | POA: Diagnosis present

## 2012-05-28 DIAGNOSIS — I495 Sick sinus syndrome: Secondary | ICD-10-CM | POA: Insufficient documentation

## 2012-05-28 DIAGNOSIS — I10 Essential (primary) hypertension: Secondary | ICD-10-CM | POA: Insufficient documentation

## 2012-05-28 HISTORY — DX: Pure hypercholesterolemia, unspecified: E78.00

## 2012-05-28 HISTORY — PX: AV NODE ABLATION: SHX5458

## 2012-05-28 HISTORY — DX: Type 2 diabetes mellitus without complications: E11.9

## 2012-05-28 HISTORY — DX: Unspecified asthma, uncomplicated: J45.909

## 2012-05-28 LAB — GLUCOSE, CAPILLARY: Glucose-Capillary: 103 mg/dL — ABNORMAL HIGH (ref 70–99)

## 2012-05-28 LAB — PROTIME-INR: INR: 2.62 — ABNORMAL HIGH (ref 0.00–1.49)

## 2012-05-28 SURGERY — AV NODE ABLATION
Anesthesia: LOCAL

## 2012-05-28 MED ORDER — MIDAZOLAM HCL 5 MG/5ML IJ SOLN
INTRAMUSCULAR | Status: AC
  Filled 2012-05-28: qty 5

## 2012-05-28 MED ORDER — ACETAMINOPHEN 325 MG PO TABS: 650.0000 mg | ORAL_TABLET | ORAL | Status: DC | PRN

## 2012-05-28 MED ORDER — SODIUM CHLORIDE 0.9 % IV SOLN: 250.0000 mL | INTRAVENOUS | Status: DC | PRN

## 2012-05-28 MED ORDER — ASPIRIN 81 MG PO TABS: 81.0000 mg | ORAL_TABLET | Freq: Every day | ORAL | Status: DC

## 2012-05-28 MED ORDER — SODIUM CHLORIDE 0.9 % IJ SOLN: 3.0000 mL | INTRAMUSCULAR | Status: DC | PRN

## 2012-05-28 MED ORDER — VENLAFAXINE HCL 37.5 MG PO TABS
37.5000 mg | ORAL_TABLET | Freq: Every day | ORAL | Status: DC
  Administered 2012-05-28: 37.5 mg via ORAL
  Filled 2012-05-28 (×2): qty 1

## 2012-05-28 MED ORDER — ONDANSETRON HCL 4 MG/2ML IJ SOLN: 4.0000 mg | Freq: Four times a day (QID) | INTRAMUSCULAR | Status: DC | PRN

## 2012-05-28 MED ORDER — WARFARIN SODIUM 2.5 MG PO TABS
2.5000 mg | ORAL_TABLET | Freq: Every day | ORAL | Status: DC
  Administered 2012-05-28: 2.5 mg via ORAL
  Filled 2012-05-28 (×2): qty 1

## 2012-05-28 MED ORDER — SODIUM CHLORIDE 0.9 % IJ SOLN
3.0000 mL | Freq: Two times a day (BID) | INTRAMUSCULAR | Status: DC
  Administered 2012-05-28: 3 mL via INTRAVENOUS

## 2012-05-28 MED ORDER — FENTANYL CITRATE 0.05 MG/ML IJ SOLN
INTRAMUSCULAR | Status: AC
  Filled 2012-05-28: qty 2

## 2012-05-28 MED ORDER — ASPIRIN 81 MG PO CHEW
81.0000 mg | CHEWABLE_TABLET | Freq: Every day | ORAL | Status: DC
  Administered 2012-05-28: 81 mg via ORAL
  Filled 2012-05-28: qty 1

## 2012-05-28 MED ORDER — TRANDOLAPRIL 4 MG PO TABS
8.0000 mg | ORAL_TABLET | Freq: Every day | ORAL | Status: DC
  Administered 2012-05-28: 4 mg via ORAL
  Administered 2012-05-29: 8 mg via ORAL
  Filled 2012-05-28 (×3): qty 2

## 2012-05-28 MED ORDER — SODIUM CHLORIDE 0.9 % IV SOLN
INTRAVENOUS | Status: DC
  Administered 2012-05-28: 15:00:00 via INTRAVENOUS

## 2012-05-28 MED ORDER — METFORMIN HCL 500 MG PO TABS
500.0000 mg | ORAL_TABLET | Freq: Two times a day (BID) | ORAL | Status: DC
  Administered 2012-05-29: 500 mg via ORAL
  Filled 2012-05-28 (×3): qty 1

## 2012-05-28 MED ORDER — BUPIVACAINE HCL (PF) 0.25 % IJ SOLN
INTRAMUSCULAR | Status: AC
  Filled 2012-05-28: qty 60

## 2012-05-28 NOTE — Brief Op Note (Signed)
05/28/2012  5:03 PM  PATIENT:  Anita Duke  71 y.o. female  PRE-OPERATIVE DIAGNOSIS:  Persistent atrial fibrillation  POST-OPERATIVE DIAGNOSIS:  Atrial fibrillation, tachycardia/ bradycardia syndrome, complete heart block  PROCEDURE:  Procedure(s) (LRB) with comments: AV NODE ABLATION (N/A)  SURGEON:  Surgeon(s) and Role:    * Hillis Range, MD - Primary  PHYSICIAN ASSISTANT:   ASSISTANTS: none   ANESTHESIA:   local  EBL:     BLOOD ADMINISTERED:none  DRAINS: none   LOCAL MEDICATIONS USED:  LIDOCAINE   SPECIMEN:  No Specimen  DISPOSITION OF SPECIMEN:  N/A  COUNTS:  YES  TOURNIQUET:  * No tourniquets in log *  DICTATION: .Other Dictation: Dictation Number [id number]  PLAN OF CARE: Admit for overnight observation  PATIENT DISPOSITION:  PACU - hemodynamically stable.   Delay start of Pharmacological VTE agent (>24hrs) due to surgical blood loss or risk of bleeding: not applicable

## 2012-05-28 NOTE — H&P (View-Only) (Signed)
 ELECTROPHYSIOLOGY HISTORY & PHYSICAL  Patient ID: Anita Duke MRN: [id number], DOB/AGE: 72/22/1942   Admit date: 05/17/2012 Date of Consult: 05/17/2012  Primary Physician: William McKeown, MD  Primary Cardiologist: Katz, MD Reason for Consultation: Bradycardia  History of Present Illness [name] is a pleasant 71 year old woman with AFib s/p MAZE 2011 and multiple DCCVs who was recently cardioverted on 05/15/2012 and presented yesterday in follow-up to see Dr. Katz for DOE. She received amiodarone prior to her DCCV which was successful for restoring sinus rhythm with 120J shock x1; however, a few hours after she arrived home following the cardioversion she began to notice exertional SOB. She denies CP, palpitations, dizziness, near syncope or syncope. During her visit with Dr. Katz yesterday, he was concerned she may be in a junctional rhythm with probable chronotropic incompetence as her heart rate did not increase when they had her walk around our office. Of note, she has a history of bradycardia for which digoxin, metoprolol and diltiazem were discontinued previously in 2011.    Past Medical History Past Medical History  Diagnosis Date  . DM (diabetes mellitus)   . Atrial fibrillation     Treated with maze procedure / recurrent atrial fibrillation February, 2011... TEE cardioversion October 19, 2009  . Atrial tachycardia     February, 2011... Tikosyn started in hospital  . Bradycardia     Post cardioversion February, 2011,/digoxin/Cardizem metoprolol stopped, may need pacemaker  . Mitral regurgitation     Treated with repair, atrial appendage was removed or tied off at surgery.. this was proven by TEE February, 2011  . Pulmonary HTN     55 mmHg, echo, February, 2011 / no mention of pulmonary hypertension echo, June, 2011  . Right ventricular dysfunction     Mild to moderate, echo, February, 2011 / normalized echo, June, 2011  . TR (tricuspid regurgitation)     Moderate, echo,  February, 2011 / trivial, echo, June, 2011  . Mitral stenosis     Mild, February, 2011, post mitral valve repair / mild, echo, June, 2011  . CAD (coronary artery disease)     LIMA to the LAD at time of mitral valve repair / LIMA atretic,, February, 2011  . Ejection fraction < 50%     EF 30% echo and TEE diagnosis February, 2011, possibly rate related / no contraction by catheter, bradycardia so carvedilol cannot be used / EF 55-60% echo, June, 2011 / EF 50%, echo, February, 2012  . HTN (hypertension)   . Hemorrhoids, internal   . Diverticulosis of colon   . Colon polyp, hyperplastic   . Environmental allergies   . atrial appendage     Removed were tied off at surgery, proven by TEE February, 2011  . S/P mitral valve repair     [facility] / Maze procedure/ atrial appendage removed were tied off  . Hx of CABG     At time of mitral valve repair  . Rash     ?Vicodin, Lotensin, Toprol  . CHF (congestive heart failure)     Mild in hospital February, 2011  . Drug therapy     Tikosyn,  stopped 02/10/2012  . Shortness of breath     Evaluated by Dr. Sood. Mat 14,2012.... bronchodilator response,,  medication started  /  patient much improved June 14, 201 2  . QT prolongation     Tikosyn and Effexor. QT prolonged October 13, 2011, peak is in dose reduced from 500  to -250   twice a day  . H/O amiodarone therapy     Satrted 02/12/2012    Past Surgical History Past Surgical History  Procedure Date  . Atrial appendage removed   . Mitral valve repair   . Coronary artery bypass graft   . Maze   . Vaginal hysterectomy   . Cholecystectomy   . Breast lumpectomy     x 2  . Cardioversion 02/08/2012    Procedure: CARDIOVERSION;  Surgeon: Jeffrey D Katz, MD;  Location: [facility] OR;  Service: Cardiovascular;  Laterality: N/A;  . Cardioversion 02/29/2012    Procedure: CARDIOVERSION;  Surgeon: Jeffrey D Katz, MD;  Location: [facility] OR;  Service: Cardiovascular;  Laterality: N/A;  . Cardioversion 05/15/2012     Procedure: CARDIOVERSION;  Surgeon: Jeffrey D Katz, MD;  Location: [facility] ENDOSCOPY;  Service: Cardiovascular;  Laterality: N/A;     Allergies/Intolerances Allergies  Allergen Reactions  . Meperidine Hcl   . Morphine   . Tetanus Toxoid     Inpatient Medications . ceFAZolin (ANCEF) IV  2 g Intravenous On Call  . chlorhexidine  60 mL Topical Once  . gentamicin irrigation  80 mg Irrigation On Call  . mupirocin ointment   Nasal Once   Family History Positive for CAD, HTN and DM   Social History Social History  . Marital Status: Married   Occupational History  . [employer]     assit manager   Social History Main Topics  . Smoking status: Never Smoker   . Smokeless tobacco: Never Used  . Alcohol Use: No  . Drug Use: No   Social History Narrative   Does not exerciseDaily caffeine use    Review of Systems General: No chills, fever, night sweats or weight changes  Cardiovascular: No chest pain, dyspnea on exertion, edema, orthopnea, palpitations, paroxysmal nocturnal dyspnea Dermatological: No rash, lesions or masses Respiratory: No cough, dyspnea Urologic: No hematuria, dysuria Abdominal: No nausea, vomiting, diarrhea, bright red blood per rectum, melena, or hematemesis Neurologic: No visual changes, weakness, changes in mental status All other systems reviewed and are otherwise negative except as noted above.  Physical Exam Blood pressure 164/69, pulse 62, temperature 97.3 F (36.3 C), temperature source Oral, resp. rate 18, height 5' 3" (1.6 m), weight 179 lb (81.194 kg), SpO2 95.00%.  General: Well developed, well appearing 71 year old female in no acute distress. HEENT: Normocephalic, atraumatic. EOMs intact. Sclera nonicteric. Oropharynx clear.  Neck: Supple without bruits. No JVD. Lungs: Respirations regular and unlabored, CTA bilaterally. No wheezes, rales or rhonchi. Heart: RRR. S1, S2 present. No murmurs, rub, S3 or S4. Abdomen: Soft, non-tender, non-distended.  BS present x 4 quadrants. No hepatosplenomegaly.  Extremities: No clubbing, cyanosis or edema. DP/PT/Radials 2+ and equal bilaterally. Psych: Normal affect. Neuro: Alert and oriented X 3. Moves all extremities spontaneously. Musculoskeletal: No kyphosis. Skin: Intact. Warm and dry. No rashes or petechiae in exposed areas.   Labs Lab Results  Component Value Date   WBC 9.9 05/13/2012   HGB 11.9* 05/13/2012   HCT 36.5 05/13/2012   MCV 87.3 05/13/2012   PLT 244.0 05/13/2012     Lab 05/13/12 1151  NA 139  K 3.5  CL 104  CO2 26  BUN 10  CREATININE 0.5  CALCIUM 8.9  PROT --  BILITOT --  ALKPHOS --  ALT --  AST --  GLUCOSE 135*   No components found with this basename: MAGNESIUM No components found with this basename: POCBNP:3 No results found for this basename: TSH,T4TOTAL,FREET3,T3FREE,THYROIDAB   in the last 72 hours   Basename 05/17/12 0930  INR 3.24*    Radiology/Studies Dg Chest 2 View  05/13/2012  *RADIOLOGY REPORT*  Clinical Data: Shortness of breath  CHEST - 2 VIEW  Comparison: 10/06/2010  Findings: Cardiomegaly again noted.  Status post median sternotomy. No acute infiltrate or pleural effusion.  No pulmonary edema.  Bony thorax is stable.  Stable bilateral basilar crowding of the bronchovascular markings.  IMPRESSION: No active disease.  No significant change.  Cardiomegaly again noted.  Status post median sternotomy.   Original Report Authenticated By: LIVIU POP, M.D.    Echocardiogram March 2013  - Left ventricle: The cavity size was normal. There was mild concentric hypertrophy. Systolic function was normal. The estimated ejection fraction was in the range of 50% to 55%. Wall motion was normal; there were no regional wall motion abnormalities. Doppler parameters are consistent with abnormal left ventricular relaxation (grade 1 diastolic dysfunction). - Mitral valve: MAC and ? annuloplasty ring. Small diastolic gradient but valve area ok by Pt1/2 An annular ring prosthesis was  present. Mild regurgitation. Valve area by pressure half-time: 2.44cm^2. Valve area by continuity equation (using LVOT flow): 1.79cm^2. - Left atrium: The atrium was moderately to severely dilated. 56 mm. - Right atrium: The atrium was moderately to severely dilated. - Pulmonary arteries: Systolic pressure was mildly increased. PA peak pressure: 42mm Hg (S).  12-lead ECG yesterday from our office show an accelerated junctional rhythm at 63 bpm;   Assessment and Plan 1. Junctional rhythm Ms. Bodenheimer is in a junctional rhythm with symptomatic chronotropic incompetence. She has also had documented bradycardia in the past prompting discontinuation of her rate controlling medications, including digoxin, metoprolol and diltiazem. In addition, she has paroxysmal AFib despite MAZE procedure and treatment with AAD therapy. Therefore, we recommend PPM implantation. Risks, benefits and alternatives to PPM implantation were discussed in detail with Ms. Nyquist today. These risks include, but are not limited to, bleeding, infection, pneumothorax, perforation, tamponade, vascular damage, renal failure, lead dislodgement, MI, stroke and death. She expressed verbal understanding and agrees to proceed with PPM implantation. We will give a single dose of vitamin K now.  The patient was seen, examined and plan of care formulated with Dr. Mykayla Brinton. Signed, EDMISTEN, BROOKE, PA-C 05/17/2012, 10:28 AM  I have seen, examined the patient, and reviewed the above assessment and plan.  Changes to above are made where necessary.  Pt with longstanding valvular heart disease.  She is s/p surgical maze and LAA ligation.  She now presents with clearly symptomatic bradycardia.  Previously, she has had difficulty with tachycardia.  She will require amiodarone and AV nodal agents long term in our efforts to maintain sinus rhythm. I would therefore recommend pacemaker implantation at this time.  Risks, benefits, alternatives to  pacemaker implantation were discussed in detail with the patient today. The patient understands that the risks include but are not limited to bleeding, infection, pneumothorax, perforation, tamponade, vascular damage, renal failure, MI, stroke, death,  and lead dislodgement and wishes to proceed. We will therefore proceed with PPM at this time. CXR is reviewed. Vitamin K given for reversal of INR prior to the procedure.  I have spoken with Dr Katz and he and I agree that given her prior LAA ligation that reversal of her INR with coumadin is reasonable.   Co Sign: Burman Bruington, MD 05/17/2012 11:12 AM   

## 2012-05-28 NOTE — Interval H&P Note (Signed)
History and Physical Interval Note:  05/28/2012 4:08 PM  The patient returned to the device clinic last week, back in afib with rapid ventricular rates and symptoms of fatigue and palpitations despite high dose amiodarone.  Dr Myrtis Ser and I had a long discussion with the patient and her spouse about our limited treatment options.  At this time, our best option appears to be AV nodal ablation.  Pacemaker interrogation today reveals stable ventricular lead pacing/ sensing parameters. Risk, benefits, and alternatives to EP study and AV nodal ablation also discussed in detail today. These risks include but are not limited to stroke, bleeding, vascular damage, tamponade, perforation, damage to the heart and other structures,  Pacemaker lead dislodgement, worsening CHF, and death. The patient understands these risk.  She also understands that she will likely be device dependant going forward and wishes to proceed.  We will therefore proceed with catheter ablation at this time.    Anita Duke  has presented today for surgery, with the diagnosis of AFib  The various methods of treatment have been discussed with the patient and family. After consideration of risks, benefits and other options for treatment, the patient has consented to  Procedure(s) (LRB) with comments: AV NODE ABLATION (N/A) as a surgical intervention .  The patient's history has been reviewed, patient examined, no change in status, stable for surgery.  I have reviewed the patient's chart and labs.  Questions were answered to the patient's satisfaction.     Hillis Range

## 2012-05-28 NOTE — Progress Notes (Signed)
PHARMACIST - PHYSICIAN COMMUNICATION DR:  Allred CONCERNING: Pharmacy Care Issues Regarding Warfarin Labs  RECOMMENDATION (Action Taken): A baseline and daily protime for three days has been ordered to meet the [facility] National Patient safety goal and comply with the current [facility] Pharmacy & Therapeutics Committee policy.   The Pharmacy will defer all warfarin dose order changes and follow up of lab results to the prescriber unless an additional order to initiate a "pharmacy Coumadin consult" is placed.  DESCRIPTION:  While hospitalized, to be in compliance with [employer] National Patient Safety Goals, all patients on warfarin must have a baseline and/or current protime prior to the administration of warfarin. Pharmacy has received your order for warfarin without these required laboratory assessments.  Georgina Pillion, PharmD, BCPS 05/28/2012 8:02 PM

## 2012-05-29 DIAGNOSIS — I4891 Unspecified atrial fibrillation: Secondary | ICD-10-CM

## 2012-05-29 LAB — PROTIME-INR
INR: 2.92 — ABNORMAL HIGH (ref 0.00–1.49)
Prothrombin Time: 29 seconds — ABNORMAL HIGH (ref 11.6–15.2)

## 2012-05-29 LAB — GLUCOSE, CAPILLARY
Glucose-Capillary: 136 mg/dL — ABNORMAL HIGH (ref 70–99)
Glucose-Capillary: 116 mg/dL — ABNORMAL HIGH (ref 70–99)

## 2012-05-29 MED ORDER — WARFARIN SODIUM 2.5 MG PO TABS: 2.5000 mg | ORAL_TABLET | Freq: Every day | ORAL | Status: DC

## 2012-05-29 NOTE — Op Note (Signed)
Anita Duke, Anita Duke NO.:  [id number]  MEDICAL RECORD NO.:  [id number]  LOCATION:  2020                         FACILITY:  [facility]  PHYSICIAN:  Hillis Range, MD       DATE OF BIRTH:  1940-11-07  DATE OF PROCEDURE:  05/28/2012 DATE OF DISCHARGE:                              OPERATIVE REPORT   SURGEON:  Hillis Range, MD  PREPROCEDURE DIAGNOSIS: 1. Persistent atrial fibrillation. 2. Tachycardia-bradycardia syndrome.  POSTPROCEDURE DIAGNOSIS: 1. Persistent atrial fibrillation. 2. Tachycardia-bradycardia syndrome. 3. 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  INTRODUCTION:  Ms. Thach is a pleasant 71 year old female with longstanding valvular heart disease.  She has had difficulties with symptomatic, persistent atrial fibrillation.  She has failed medical therapy with multiple antiarrhythmic drugs including amiodarone. Recently she presented with symptomatic bradycardia with junctional rhythm.  She therefore underwent pacemaker implantation.  She was noted to have severe biatrial enlargement.  She unfortunately has returned to atrial fibrillation despite medical therapy with high-dose amiodarone. After a long discussion with the patient and also with Dr. Myrtis Ser, it was felt prudent to proceed with an AV nodal ablation at this time as her therapies are otherwise quite limited.  DESCRIPTION OF PROCEDURE:  Informed written consent was obtained and the patient was brought to the electrophysiology lab in the fasting state. She was adequately sedated with intravenous Versed and fentanyl as outlined in the nursing report.  The patient's pacemaker was interrogated and this revealed a battery voltage of 2.80 V with a ventricular lead impedance of 654 Ohms and R-waves measuring 15-22 mV. She was in atrial fibrillation.  The atrial lead impedance was 633 Ohms with sensing of atrial fibrillation waves of 1.4 mV.  Her ventricular lead threshold was 0.5 V at 0.4  msec.  Her pacemaker was therefore felt to be suitable for AV nodal ablation backup pacing today.  Her right groin was prepped and draped in the usual sterile fashion by the EP lab staff.  Using a percutaneous Seldinger technique, one 8-French hemostasis sheath was placed into the right common femoral vein.  A 7- [place] [employer] 4-mm ablation catheter was introduced through the right common femoral vein and advanced into the right atrium.  Atrial mapping was then performed.  Mapping along the region of the His was performed.  The HV interval was found to measure 50 msec today.  An initial radiofrequency application was delivered with a target temperature of 60 degrees at 50 Watts for 45 seconds.  The patient had transient complete heart block with subsequent recovery of her AV nodal function.  Additional mapping was therefore performed along the region of the AV node.  A second radiofrequency application was delivered at 50 Watts with a target temperature of 60 degrees for 60 seconds.  Initially accelerated junctional rhythm was observed and then subsequently complete heart block with no underlying escape  when pacing at 30 beats per minute.  The patient was observed for 20 minutes without return of conduction through her AV node.  Her device was programed DDIR with a lower rate limit of 80 beats per minute.  The procedure was therefore considered completed.  All catheters were removed  and the sheaths were aspirated and flushed.  The sheath was removed and hemostasis was assured.  There were no early apparent complications.  CONCLUSIONS: 1. Permanent atrial fibrillation with symptomatic tachycardia. 2. Successful AV nodal ablation with complete heart block achieved. 3. No early apparent complications.     Hillis Range, MD     JA/MEDQ  D:  05/28/2012  T:  05/29/2012  Job:  [id number]  cc:   [name], MD, [facility]

## 2012-05-29 NOTE — Op Note (Signed)
NAME:  Grasmick, Tylisha               ACCOUNT NO.:  [id number]  MEDICAL RECORD NO.:  [id number]  LOCATION:  2020                         FACILITY:  [facility]  PHYSICIAN:  Masoud Nyce, MD       DATE OF BIRTH:  11/29/1940  DATE OF PROCEDURE:  05/28/2012 DATE OF DISCHARGE:                              OPERATIVE REPORT   SURGEON:  Frances Ambrosino, MD  PREPROCEDURE DIAGNOSIS: 1. Persistent atrial fibrillation. 2. Tachycardia-bradycardia syndrome.  POSTPROCEDURE DIAGNOSIS: 1. Persistent atrial fibrillation. 2. Tachycardia-bradycardia syndrome. 3. 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  INTRODUCTION:  Ms. Hibbard is a pleasant 71-year-old female with longstanding valvular heart disease.  She has had difficulties with symptomatic, persistent atrial fibrillation.  She has failed medical therapy with multiple antiarrhythmic drugs including amiodarone. Recently she presented with symptomatic bradycardia with junctional rhythm.  She therefore underwent pacemaker implantation.  She was noted to have severe biatrial enlargement.  She unfortunately has returned to atrial fibrillation despite medical therapy with high-dose amiodarone. After a long discussion with the patient and also with Dr. Katz, it was felt prudent to proceed with an AV nodal ablation at this time as her therapies are otherwise quite limited.  DESCRIPTION OF PROCEDURE:  Informed written consent was obtained and the patient was brought to the electrophysiology lab in the fasting state. She was adequately sedated with intravenous Versed and fentanyl as outlined in the nursing report.  The patient's pacemaker was interrogated and this revealed a battery voltage of 2.80 V with a ventricular lead impedance of 654 Ohms and R-waves measuring 15-22 mV. She was in atrial fibrillation.  The atrial lead impedance was 633 Ohms with sensing of atrial fibrillation waves of 1.4 mV.  Her ventricular lead threshold was 0.5 V at 0.4  msec.  Her pacemaker was therefore felt to be suitable for AV nodal ablation backup pacing today.  Her right groin was prepped and draped in the usual sterile fashion by the EP lab staff.  Using a percutaneous Seldinger technique, one 8-French hemostasis sheath was placed into the right common femoral vein.  A 7- French [employer] Blazer 4-mm ablation catheter was introduced through the right common femoral vein and advanced into the right atrium.  Atrial mapping was then performed.  Mapping along the region of the His was performed.  The HV interval was found to measure 50 msec today.  An initial radiofrequency application was delivered with a target temperature of 60 degrees at 50 Watts for 45 seconds.  The patient had transient complete heart block with subsequent recovery of her AV nodal function.  Additional mapping was therefore performed along the region of the AV node.  A second radiofrequency application was delivered at 50 Watts with a target temperature of 60 degrees for 60 seconds.  Initially accelerated junctional rhythm was observed and then subsequently complete heart block with no underlying escape  when pacing at 30 beats per minute.  The patient was observed for 20 minutes without return of conduction through her AV node.  Her device was programed DDIR with a lower rate limit of 80 beats per minute.  The procedure was therefore considered completed.  All catheters were removed   and the sheaths were aspirated and flushed.  The sheath was removed and hemostasis was assured.  There were no early apparent complications.  CONCLUSIONS: 1. Permanent atrial fibrillation with symptomatic tachycardia. 2. Successful AV nodal ablation with complete heart block achieved. 3. No early apparent complications.     Chanin Frumkin, MD     JA/MEDQ  D:  05/28/2012  T:  05/29/2012  Job:  [id number]  cc:   Jeffrey D. Katz, MD, FACC 

## 2012-05-29 NOTE — Discharge Summary (Signed)
ELECTROPHYSIOLOGY DISCHARGE SUMMARY    Patient ID: Anita Duke,  MRN: [id number], DOB/AGE: 72/14/1942 72 y.o.  Admit date: 05/28/2012 Discharge date: 05/29/2012  Primary Care Physician: Lucky Cowboy, MD Primary Cardiologist: Myrtis Ser, MD  Primary Discharge Diagnosis:  1. Persistent, symptomatic atrial fibrillation despite prior MAZE procedure, AAD therapy and multiple DCCVs, now s/p AV node ablation  2. Tachy-brady syndrome, PPM implantation previously  Secondary Discharge Diagnoses:  1. CAD s/p CABG 2. Mitral stenosis s/p MV repair 3. DM 4. Pulmonary hypertension 5. HTN  Procedures This Admission:  1. Successful AV nodal ablation with complete heart block achieved. No early apparent complications.  History and Hospital Course:  Anita Duke is a pleasant 71 year old female with longstanding valvular heart disease. She has had difficulties with symptomatic, persistent atrial fibrillation. She has failed medical therapy with multiple antiarrhythmic drugs including amiodarone. Recently she presented with symptomatic bradycardia with junctional rhythm. She therefore underwent pacemaker implantation. She was noted to have severe biatrial enlargement. She unfortunately has returned to atrial fibrillation despite medical therapy with high-dose amiodarone. After a long discussion with the patient and also with Dr. Myrtis Ser, it was felt prudent to proceed with an AV nodal ablation at this time as her therapies are otherwise quite limited. She presented yesterday for AV node ablation. Ms. Boom tolerated this procedure well without any immediate complication. She remains hemodynamically stable and afebrile. Her groin site is intact without significant bleeding or hematoma. She has been given discharge instructions including wound care and activity restrictions. She will follow-up in clinic in 2 weeks for device check. She has been seen, examined and deemed stable for discharge today by Dr. Hillis Range.  Physical Exam: Vitals: Blood pressure 148/71, pulse 86, temperature 98.2 F (36.8 C), temperature source Oral, resp. rate 16, height 5\' 3"  (1.6 m), weight 172 lb 3.2 oz (78.109 kg), SpO2 99.00%.  General: Well developed, well appearing 71 year old female in no acute distress.  Heart: S1, S2 present without murmur, rub or gallop.  Lungs: CTA bilaterally. No wheezes, rales or rhonchi.  Abdomen: Soft, nondistended.  Extremities: No cyanosis, clubbing or edema.  Neuro: Alert and oriented x3. No focal deficits.  Skin: Groin site is intact without significant bleeding or hematoma.  Labs: Lab Results  Component Value Date   WBC 7.4 05/23/2012   HGB 12.0 05/23/2012   HCT 36.9 05/23/2012   MCV 87.3 05/23/2012   PLT 355.0 05/23/2012     Lab 05/23/12 1556  NA 139  K 3.7  CL 101  CO2 30  BUN 15  CREATININE 0.7  CALCIUM 8.9  PROT --  BILITOT --  ALKPHOS --  ALT --  AST --  GLUCOSE 129*    Basename 05/29/12 0515  INR 2.92*    Disposition:  The patient is being discharged in stable condition.  Follow-up:   Follow-up Information    Follow up with [facility] On 06/10/2012. (At 10:00 AM for device check)    Contact information:   [address]  [address] [place] [address] [phone]       Follow up with [facility]. On 06/06/2012. (At 11:05 AM for Coumadin follow-up (INR check))    Contact information:   [address]  [address] [place] [address] [phone]      Follow up with [name], MD. On 06/06/2012. (At 11:15 AM (following your Coumadin check, as previously scheduled))    Contact information:   [address] [place] [address] [phone]       Follow up with Hillis Range, MD. On 08/23/2012. (At 12:30 PM)    Contact information:   [address]  [address] [place] [address] [phone]       Discharge Medications:    Medication List     As of 05/29/2012 10:00 AM      STOP taking these medications         amiodarone 200 MG tablet   Commonly known as: PACERONE      TAKE these medications         acetaminophen 500 MG tablet   Commonly known as: TYLENOL   Take 1,000 mg by mouth every 6 (six) hours as needed. For pain      albuterol 108 (90 BASE) MCG/ACT inhaler   Commonly known as: PROVENTIL HFA;VENTOLIN HFA   Inhale 2 puffs into the lungs every 6 (six) hours as needed. For shortness of breath      aspirin 81 MG tablet   Take 81 mg by mouth daily.      atorvastatin 40 MG tablet   Commonly known as: LIPITOR   Take 40 mg by mouth daily.      calcium carbonate 600 MG Tabs   Commonly known as: OS-CAL   Take 600 mg by mouth daily.      cholecalciferol 1000 UNITS tablet   Commonly known as: VITAMIN D   Take 4,000 Units by mouth daily.      fish oil-omega-3 fatty acids 1000 MG capsule   Take 2 g by mouth 2 (two) times daily.      furosemide 40 MG tablet   Commonly known as: LASIX   Take 20 mg by mouth daily.      metFORMIN 500 MG tablet   Commonly known as: GLUCOPHAGE   Take 500 mg by mouth 2 (two) times daily with a meal.      potassium chloride SA 20 MEQ tablet   Commonly known as: K-DUR,KLOR-CON   Take 2 tablets (40 mEq total) by mouth daily.      trandolapril 4 MG tablet   Commonly known as: MAVIK   Take 8 mg by mouth daily.      venlafaxine 37.5 MG tablet   Commonly known as: EFFEXOR   Take 37.5 mg by mouth daily.      warfarin 2.5 MG tablet   Commonly known as: COUMADIN   Take 2.5 mg by mouth daily.       Duration of Discharge Encounter: Greater than 30 minutes including physician time.  Signed, Rick Duff, PA-C 05/29/2012, 10:00 AM   Agree with above Hillis Range, MD

## 2012-05-29 NOTE — Progress Notes (Signed)
Anita Duke's INR is 2.9 today. Dr. Johney Frame instructed her to hold her Coumadin dose tonight (05/29/2012), take 1/2 dose tomorrow night (05/30/2012) and then resume her usual home dose thereafter.

## 2012-05-31 ENCOUNTER — Telehealth: Payer: Self-pay | Admitting: Cardiology

## 2012-05-31 NOTE — Telephone Encounter (Signed)
Per Dr. Johney Frame, okay to remove dressing and shower.

## 2012-05-31 NOTE — Telephone Encounter (Signed)
plz return call to patient at [phone] regarding ablation f/u care.  She would like to know when she can take off bandage and when she will be able to shower.

## 2012-06-04 ENCOUNTER — Encounter: Payer: Self-pay | Admitting: Cardiology

## 2012-06-04 DIAGNOSIS — I442 Atrioventricular block, complete: Secondary | ICD-10-CM | POA: Insufficient documentation

## 2012-06-04 DIAGNOSIS — I495 Sick sinus syndrome: Secondary | ICD-10-CM | POA: Insufficient documentation

## 2012-06-06 ENCOUNTER — Ambulatory Visit (INDEPENDENT_AMBULATORY_CARE_PROVIDER_SITE_OTHER): Payer: BC Managed Care – PPO | Admitting: *Deleted

## 2012-06-06 ENCOUNTER — Encounter: Payer: Self-pay | Admitting: Internal Medicine

## 2012-06-06 ENCOUNTER — Encounter: Payer: Self-pay | Admitting: Cardiology

## 2012-06-06 ENCOUNTER — Ambulatory Visit (INDEPENDENT_AMBULATORY_CARE_PROVIDER_SITE_OTHER): Payer: BC Managed Care – PPO | Admitting: Cardiology

## 2012-06-06 ENCOUNTER — Ambulatory Visit: Payer: BC Managed Care – PPO | Admitting: Cardiology

## 2012-06-06 VITALS — BP 138/76 | HR 87 | Ht 63.0 in | Wt 176.0 lb

## 2012-06-06 DIAGNOSIS — I4891 Unspecified atrial fibrillation: Secondary | ICD-10-CM

## 2012-06-06 DIAGNOSIS — R001 Bradycardia, unspecified: Secondary | ICD-10-CM

## 2012-06-06 DIAGNOSIS — I498 Other specified cardiac arrhythmias: Secondary | ICD-10-CM

## 2012-06-06 DIAGNOSIS — I471 Supraventricular tachycardia: Secondary | ICD-10-CM

## 2012-06-06 DIAGNOSIS — R0989 Other specified symptoms and signs involving the circulatory and respiratory systems: Secondary | ICD-10-CM

## 2012-06-06 DIAGNOSIS — I10 Essential (primary) hypertension: Secondary | ICD-10-CM

## 2012-06-06 DIAGNOSIS — R9430 Abnormal result of cardiovascular function study, unspecified: Secondary | ICD-10-CM

## 2012-06-06 DIAGNOSIS — I4719 Other supraventricular tachycardia: Secondary | ICD-10-CM

## 2012-06-06 DIAGNOSIS — R0602 Shortness of breath: Secondary | ICD-10-CM

## 2012-06-06 DIAGNOSIS — Z7901 Long term (current) use of anticoagulants: Secondary | ICD-10-CM

## 2012-06-06 DIAGNOSIS — Z9229 Personal history of other drug therapy: Secondary | ICD-10-CM

## 2012-06-06 DIAGNOSIS — Z09 Encounter for follow-up examination after completed treatment for conditions other than malignant neoplasm: Secondary | ICD-10-CM

## 2012-06-06 DIAGNOSIS — R9431 Abnormal electrocardiogram [ECG] [EKG]: Secondary | ICD-10-CM

## 2012-06-06 DIAGNOSIS — I495 Sick sinus syndrome: Secondary | ICD-10-CM

## 2012-06-06 DIAGNOSIS — Z8679 Personal history of other diseases of the circulatory system: Secondary | ICD-10-CM

## 2012-06-06 DIAGNOSIS — I050 Rheumatic mitral stenosis: Secondary | ICD-10-CM

## 2012-06-06 DIAGNOSIS — Z950 Presence of cardiac pacemaker: Secondary | ICD-10-CM

## 2012-06-06 DIAGNOSIS — IMO0002 Reserved for concepts with insufficient information to code with codable children: Secondary | ICD-10-CM

## 2012-06-06 LAB — PACEMAKER DEVICE OBSERVATION
AL AMPLITUDE: 2 mv
AL IMPEDENCE PM: 573 Ohm
BATTERY VOLTAGE: 2.8 V
RV LEAD AMPLITUDE: 15.68 mv

## 2012-06-06 LAB — POCT INR: INR: 3.4

## 2012-06-06 NOTE — Assessment & Plan Note (Signed)
Her pacemaker is in place and looks good. There is some swelling. There is no heat or redness. I suspect this will improve over time.

## 2012-06-06 NOTE — Progress Notes (Signed)
Wound check pacer in clinic  

## 2012-06-06 NOTE — Assessment & Plan Note (Signed)
The patient has also had other atrial arrhythmias. This should not cause her problems going forward. Since the pacemaker is not tracking her atrial activity because of atrial fib, there would be no reason that it would since an atrial tachycardia.

## 2012-06-06 NOTE — Assessment & Plan Note (Signed)
The issue of sick sinus syndrome has now been taken care of with AV node ablation.

## 2012-06-06 NOTE — Assessment & Plan Note (Signed)
In 2011 the patient's ejection fraction had dropped when she had a rate related cardiomyopathy. Her ejection fraction then improved again recently. We will follow her over time.

## 2012-06-06 NOTE — Assessment & Plan Note (Signed)
Blood pressure is controlled. No change in therapy. 

## 2012-06-06 NOTE — Assessment & Plan Note (Signed)
Historically we have been worried about bradycardia. This is no longer an issue with her pacemaker.

## 2012-06-06 NOTE — Progress Notes (Signed)
HPI  The patient returns today after her AV node ablation. She recently had a pacemaker placed. She did not hold sinus rhythm on high-dose amiodarone and we decided to proceed with AV node ablation. She feels great. Her shortness of breath is greatly improved. Her atrial has returned to atrial fibrillation. Amiodarone has been stopped. Her pacemaker is set at DD IR with a rate of 80. She is fully Active.  I have spent an enormous amount of time trying to move this patient towards improvement. With the help of Dr. Johney Frame we have finally reached the place where she is definitely improved. Her pacemaker is in place working well. She has AV node ablation.  Allergies  Allergen Reactions  . Meperidine Hcl Nausea And Vomiting  . Morphine Other (See Comments)    vomiting  . Tetanus Toxoid Rash    "years ago"    Current Outpatient Prescriptions  Medication Sig Dispense Refill  . acetaminophen (TYLENOL) 500 MG tablet Take 1,000 mg by mouth every 6 (six) hours as needed. For pain      . albuterol (PROVENTIL HFA;VENTOLIN HFA) 108 (90 BASE) MCG/ACT inhaler Inhale 2 puffs into the lungs every 6 (six) hours as needed. For shortness of breath      . aspirin 81 MG tablet Take 81 mg by mouth daily.        Marland Kitchen atorvastatin (LIPITOR) 40 MG tablet Take 40 mg by mouth daily.      . calcium carbonate (OS-CAL) 600 MG TABS Take 600 mg by mouth daily.       . cholecalciferol (VITAMIN D) 1000 UNITS tablet Take 4,000 Units by mouth daily.      . fish oil-omega-3 fatty acids 1000 MG capsule Take 2 g by mouth 2 (two) times daily.        . furosemide (LASIX) 40 MG tablet Take 20 mg by mouth daily.      . metFORMIN (GLUCOPHAGE) 500 MG tablet Take 500 mg by mouth 2 (two) times daily with a meal.      . potassium chloride SA (K-DUR,KLOR-CON) 20 MEQ tablet Take 2 tablets (40 mEq total) by mouth daily.  180 tablet  3  . trandolapril (MAVIK) 4 MG tablet Take 8 mg by mouth daily.       Marland Kitchen venlafaxine (EFFEXOR) 37.5 MG tablet  Take 37.5 mg by mouth daily.       Marland Kitchen warfarin (COUMADIN) 2.5 MG tablet Take 1 tablet (2.5 mg total) by mouth daily. HOLD your dose tonight (05/29/2012), take 1/2 dose tomorrow night (05/30/2012) and then resume usual home dose thereafter.  30 tablet  3    History   Social History  . Marital Status: Married    Spouse Name: N/A    Number of Children: N/A  . 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     Social History Main Topics  . Smoking status: Never Smoker   . Smokeless tobacco: Never Used  . Alcohol Use: No  . Drug Use: No  . Sexually Active: No   Other Topics Concern  . Not on file   Social History Narrative   Does not et regular exerciseDaily caffeine use    Family History  Problem Relation Age of Onset  . Cancer Maternal Grandfather   . Heart disease Father   . Heart disease Sister   . Diabetes Mother   . Diabetes Father   .  Diabetes Sister   . Diabetes Paternal Grandfather   . Diabetes Paternal Grandmother   . Heart disease Paternal Uncle     Past Medical History  Diagnosis Date  . Atrial fibrillation     Treated with maze procedure / recurrent atrial fibrillation February, 2011... TEE cardioversion October 19, 2009  . Atrial tachycardia     February, 2011... Tikosyn started in hospital  . Bradycardia     Post cardioversion February, 2011,/digoxin/Cardizem metoprolol stopped, may need pacemaker  . Mitral regurgitation     Treated with repair, atrial appendage was removed or tied off at surgery.. this was proven by TEE February, 2011  . Pulmonary HTN     55 mmHg, echo, February, 2011 / no mention of pulmonary hypertension echo, June, 2011  . Right ventricular dysfunction     Mild to moderate, echo, February, 2011 / normalized echo, June, 2011  . TR (tricuspid regurgitation)     Moderate, echo, February, 2011 / trivial, echo, June, 2011  . Mitral stenosis     Mild, February, 2011, post mitral valve repair /  mild, echo, June, 2011  . CAD (coronary artery disease)     LIMA to the LAD at time of mitral valve repair / LIMA atretic,, February, 2011  . Ejection fraction < 50%     EF 30% echo and TEE diagnosis February, 2011, possibly rate related / no contraction by catheter, bradycardia so carvedilol cannot be used / EF 55-60% echo, June, 2011 / EF 50%, echo, February, 2012  . HTN (hypertension)   . Hemorrhoids, internal   . Diverticulosis of colon   . Colon polyp, hyperplastic   . Environmental allergies   . atrial appendage     Removed were tied off at surgery, proven by TEE February, 2011  . S/P mitral valve repair     [facility] / Maze procedure/ atrial appendage removed were tied off  . Hx of CABG     At time of mitral valve repair  . Rash     ?Vicodin, Lotensin, Toprol  . Drug therapy     Tikosyn,  stopped 02/10/2012  . QT prolongation     Tikosyn and Effexor. QT prolonged October 13, 2011, peak is in dose reduced from 500  to -250 twice a day  . H/O amiodarone therapy     Satrted 02/12/2012  . PONV (postoperative nausea and vomiting)   . Anginal pain   . Pacemaker     May 17, 2012 for  bradycardia and sick sinus syndrome  . Sick sinus syndrome     s/p Medtronic dual chamber PPM 05/17/12  . High cholesterol   . CHF (congestive heart failure) 2011    Mild in hospital February, 2011  . Asthma   . COPD (chronic obstructive pulmonary disease)   . Shortness of breath     Evaluated by Dr. Craige Cotta. Mat [id number].... bronchodilator response,,  medication started  /  patient much improved June 14, 201 2  . Type II diabetes mellitus   . Arthritis     "not bad; little in my hands; some in my knees" (05/28/2012)  . Acquired complete AV block     AV node ablation May 28, 2012    Past Surgical History  Procedure Date  . Atrial appendage removed   . Mitral valve repair 2001  . Maze 2001    w/ MVR & CABG  . Cardioversion 02/08/2012    Procedure: CARDIOVERSION;  Surgeon: [name],  MD;  Location: [facility] OR;  Service: Cardiovascular;  Laterality: N/A;  . Cardioversion 02/29/2012    Procedure: CARDIOVERSION;  Surgeon: [name], MD;  Location: [facility] OR;  Service: Cardiovascular;  Laterality: N/A;  . Cardioversion 05/15/2012    Procedure: CARDIOVERSION;  Surgeon: [name], MD;  Location: [facility] ENDOSCOPY;  Service: Cardiovascular;  Laterality: N/A;  . Cardiac catheterization   . Tonsillectomy and adenoidectomy ~ 1950  . Cholecystectomy ?1995  . Coronary artery bypass graft 2001    CABG X1  . Breast lumpectomy 1990's    bilaterally  . Insert / replace / remove pacemaker 05/17/2012    initial placement  . Abdominal hysterectomy ?2002    ROS   Patient denies fever, chills, headache, sweats, rash, change in vision, change in hearing, chest pain, cough, nausea vomiting, urinary symptoms. All other systems are reviewed and are negative.  PHYSICAL EXAM  Patient looks great. She is oriented to person time and place. Affect is normal. There is no jugulovenous distention. Lungs are clear. Cardiac exam reveals S1 and S2. There no clicks or significant murmurs. The rhythm is regular. The pacemaker site is healing nicely. There is some residual swelling. There is no redness or heat. Abdomen is soft. There is no peripheral edema.  Filed Vitals:   06/06/12 1437  BP: 138/76  Pulse: 87  Height: 5\' 3"  (1.6 m)  Weight: 176 lb (79.833 kg)  SpO2: 94%   Her pacemaker was interrogated today and I have reviewed the specific results with the team. Her atrium is fibrillating. Her pacemaker is working appropriately in the DD IR mode. The rate is set at 80.  ASSESSMENT & PLAN

## 2012-06-06 NOTE — Assessment & Plan Note (Signed)
Patient has now received a be noted ablation. We know that her atrium has returned atrial fibrillation. There was no reason to continue high-dose amiodarone as it did not appear to be effective anyway. She will remain anticoagulated although her atrial appendage was removed at surgery.

## 2012-06-06 NOTE — Patient Instructions (Addendum)
Your physician recommends that you schedule a follow-up appointment in: 8 WEEKS WITH DR. KATZ   Your physician recommends that you continue on your current medications as directed. Please refer to the Current Medication list given to you today.  Patient can start back to work

## 2012-06-06 NOTE — Assessment & Plan Note (Signed)
The patient has never had a clinical problem related to her QT interval. However we were worried about it when using medicines like Tikosyn and amiodarone especially since she is on effexor.

## 2012-06-06 NOTE — Assessment & Plan Note (Signed)
She continues on Coumadin. 

## 2012-06-06 NOTE — Assessment & Plan Note (Signed)
The patient does have mild functional mitral stenosis from her mitral valve repair. It would be optimal to have sinus rhythm but this is not possible. However now we will have a regular rhythm which will be better for her.

## 2012-06-06 NOTE — Assessment & Plan Note (Signed)
The patient does have lung disease. However recently it appeared that her rhythm was the basis of her shortness of breath. She says that her shortness of breath is greatly improved. This suggests that return to a regular rhythm really is the most important thing for her shortness of breath at this time. No further workup.

## 2012-06-10 ENCOUNTER — Ambulatory Visit: Payer: BC Managed Care – PPO

## 2012-06-18 ENCOUNTER — Encounter: Payer: Self-pay | Admitting: Internal Medicine

## 2012-06-19 ENCOUNTER — Telehealth: Payer: Self-pay | Admitting: Cardiology

## 2012-06-19 NOTE — Telephone Encounter (Signed)
New Problem:    Patient called in wanting to speak with you about her June 2013 short term disability.  Please call back.

## 2012-06-19 NOTE — Telephone Encounter (Signed)
Anita Duke states the problem with disability has been solved.

## 2012-06-20 ENCOUNTER — Ambulatory Visit (INDEPENDENT_AMBULATORY_CARE_PROVIDER_SITE_OTHER): Payer: BC Managed Care – PPO

## 2012-06-20 DIAGNOSIS — I4891 Unspecified atrial fibrillation: Secondary | ICD-10-CM

## 2012-06-20 DIAGNOSIS — Z7901 Long term (current) use of anticoagulants: Secondary | ICD-10-CM

## 2012-06-20 DIAGNOSIS — Z8679 Personal history of other diseases of the circulatory system: Secondary | ICD-10-CM

## 2012-07-11 ENCOUNTER — Ambulatory Visit (INDEPENDENT_AMBULATORY_CARE_PROVIDER_SITE_OTHER): Payer: BC Managed Care – PPO

## 2012-07-11 DIAGNOSIS — I4891 Unspecified atrial fibrillation: Secondary | ICD-10-CM

## 2012-07-11 DIAGNOSIS — Z7901 Long term (current) use of anticoagulants: Secondary | ICD-10-CM

## 2012-07-11 DIAGNOSIS — Z8679 Personal history of other diseases of the circulatory system: Secondary | ICD-10-CM

## 2012-07-19 ENCOUNTER — Encounter: Payer: Self-pay | Admitting: Gastroenterology

## 2012-07-29 ENCOUNTER — Other Ambulatory Visit: Payer: Self-pay | Admitting: Pharmacist

## 2012-07-29 MED ORDER — WARFARIN SODIUM 2.5 MG PO TABS: ORAL_TABLET | ORAL | Status: DC

## 2012-08-06 ENCOUNTER — Encounter: Payer: Self-pay | Admitting: Internal Medicine

## 2012-08-06 ENCOUNTER — Ambulatory Visit (INDEPENDENT_AMBULATORY_CARE_PROVIDER_SITE_OTHER): Payer: BC Managed Care – PPO

## 2012-08-06 ENCOUNTER — Ambulatory Visit (INDEPENDENT_AMBULATORY_CARE_PROVIDER_SITE_OTHER): Payer: BC Managed Care – PPO | Admitting: Cardiology

## 2012-08-06 ENCOUNTER — Encounter: Payer: Self-pay | Admitting: Cardiology

## 2012-08-06 VITALS — BP 134/68 | HR 94 | Wt 176.0 lb

## 2012-08-06 DIAGNOSIS — I050 Rheumatic mitral stenosis: Secondary | ICD-10-CM

## 2012-08-06 DIAGNOSIS — I10 Essential (primary) hypertension: Secondary | ICD-10-CM

## 2012-08-06 DIAGNOSIS — I471 Supraventricular tachycardia: Secondary | ICD-10-CM

## 2012-08-06 DIAGNOSIS — Z950 Presence of cardiac pacemaker: Secondary | ICD-10-CM

## 2012-08-06 DIAGNOSIS — I2789 Other specified pulmonary heart diseases: Secondary | ICD-10-CM

## 2012-08-06 DIAGNOSIS — I2510 Atherosclerotic heart disease of native coronary artery without angina pectoris: Secondary | ICD-10-CM

## 2012-08-06 DIAGNOSIS — I498 Other specified cardiac arrhythmias: Secondary | ICD-10-CM

## 2012-08-06 DIAGNOSIS — Z7901 Long term (current) use of anticoagulants: Secondary | ICD-10-CM

## 2012-08-06 DIAGNOSIS — R0989 Other specified symptoms and signs involving the circulatory and respiratory systems: Secondary | ICD-10-CM

## 2012-08-06 DIAGNOSIS — R0609 Other forms of dyspnea: Secondary | ICD-10-CM

## 2012-08-06 DIAGNOSIS — I4891 Unspecified atrial fibrillation: Secondary | ICD-10-CM

## 2012-08-06 DIAGNOSIS — I272 Pulmonary hypertension, unspecified: Secondary | ICD-10-CM

## 2012-08-06 DIAGNOSIS — Z8679 Personal history of other diseases of the circulatory system: Secondary | ICD-10-CM

## 2012-08-06 DIAGNOSIS — R0600 Dyspnea, unspecified: Secondary | ICD-10-CM

## 2012-08-06 DIAGNOSIS — R0602 Shortness of breath: Secondary | ICD-10-CM

## 2012-08-06 MED ORDER — WARFARIN SODIUM 2.5 MG PO TABS: ORAL_TABLET | ORAL | Status: DC

## 2012-08-06 NOTE — Assessment & Plan Note (Signed)
In the past there was question of pulmonary hypertension. However her echo in February, 2012 did not seem to show much pulmonary hypertension. Her followup echo will be helpful.

## 2012-08-06 NOTE — Assessment & Plan Note (Signed)
The patient had a mitral valve repair. Followup studies have shown no significant mitral regurgitation. 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 It is possible that this could be playing a role with her current shortness of breath. I will look into the possibility of whether her pacing rate should be turned down including turning off the rate responsive mode so that she never has significant increase in heart rate. Call talk with Dr. Johney Frame about this.

## 2012-08-06 NOTE — Assessment & Plan Note (Signed)
The pacemaker was interrogated. The resting rate was turned from 80 down to 70. Interrogation showed that the patient has rates between 80 and 100 primarily. This seems to argue against marked tachycardia causing her symptoms.

## 2012-08-06 NOTE — Assessment & Plan Note (Signed)
Blood pressure is controlled. No change in therapy. 

## 2012-08-06 NOTE — Assessment & Plan Note (Signed)
Patient had also had an atrial tachycardia. She does not appear to be having this significantly at this time as far as I can tell.

## 2012-08-06 NOTE — Assessment & Plan Note (Addendum)
It was felt that the patient had a bronchodilator response in the past. In early 2012 she had some symptoms and responded to medications. At this time it does not appear that this is a primary pulmonary issue. I may need to ask for pulmonary help again. She will have a chest x-ray.  The etiology of the patient's return of shortness of breath is not clear. I had hoped that with AV node ablation and her pacemaker that she would feel better. She definitely felt better for a period of time. Now she feels worse again. It does not appear to be related to marked tachycardia. Therefore I've chosen not to any medications. CBC and chemistry will be checked. Chest x-ray will be done. Followup two-dimensional echo will be done to reassess LV function to be sure that she has not had a sudden worsening in her LV function again. I will talk to Dr. Johney Frame to see if there any clues from her pacemaker interrogation that might help [name]. She does not appear to be volume overloaded. I have not pushed a diuretic yet.  We had the patient walk in the hall with an O2 sat monitor. Her heart rate did not increase significantly. Her O2 sat remained 97 and then down to 96%. She became short of breath and somewhat weak. After she sat down her resting heart rate increased to approximately 105.

## 2012-08-06 NOTE — Assessment & Plan Note (Signed)
The patient had only mild coronary disease. It seems unlikely that her current symptom is ischemic. This will have to be kept in mind

## 2012-08-06 NOTE — Assessment & Plan Note (Signed)
The patient has atrial fib but she has AV node ablation. Her amiodarone had been stopped previously. Her left atrial appendage was removed at the time of surgery. We had still continued Coumadin anyway.

## 2012-08-06 NOTE — Patient Instructions (Addendum)
A chest x-ray takes a picture of the organs and structures inside the chest, including the heart, lungs, and blood vessels. This test can show several things, including, whether the heart is enlarges; whether fluid is building up in the lungs; and whether pacemaker / defibrillator leads are still in place.  Your physician has requested that you have an echocardiogram. Echocardiography is a painless test that uses sound waves to create images of your heart. It provides your doctor with information about the size and shape of your heart and how well your heart's chambers and valves are working. This procedure takes approximately one hour. There are no restrictions for this procedure.  Your physician recommends that you return for lab work in: today (bmet, cbc)

## 2012-08-06 NOTE — Progress Notes (Signed)
HPI  The patient is seen today to followup atrial arrhythmias and cardiomyopathy and mitral valve repair and shortness of breath. Her cardiac care has been very complex. Ultimately it was decided that that she should have ablation of her AV node with a pacemaker in place. This was done. Around that time she had marked exertional shortness of breath. This improved with the interventions including AV node ablation and pacemaker placement. I had seen her back in the office once and she was feeling great. She now once again has return of significant exertional shortness of breath. She becomes very short of breath and weak with a small amount of walking. She does not have PND or orthopnea. She does not have shortness of breath at rest. She does not have edema.  Allergies  Allergen Reactions  . Meperidine Hcl Nausea And Vomiting  . Morphine Other (See Comments)    vomiting  . Tetanus Toxoid Rash    "years ago"    Current Outpatient Prescriptions  Medication Sig Dispense Refill  . acetaminophen (TYLENOL) 500 MG tablet Take 1,000 mg by mouth every 6 (six) hours as needed. For pain      . albuterol (PROVENTIL HFA;VENTOLIN HFA) 108 (90 BASE) MCG/ACT inhaler Inhale 2 puffs into the lungs every 6 (six) hours as needed. For shortness of breath      . aspirin 81 MG tablet Take 81 mg by mouth daily.        Marland Kitchen atorvastatin (LIPITOR) 40 MG tablet Take 40 mg by mouth daily.      . calcium carbonate (OS-CAL) 600 MG TABS Take 600 mg by mouth daily.       . cholecalciferol (VITAMIN D) 1000 UNITS tablet Take 4,000 Units by mouth daily.      . fish oil-omega-3 fatty acids 1000 MG capsule Take 2 g by mouth 2 (two) times daily.        . furosemide (LASIX) 40 MG tablet Take 20 mg by mouth daily.      . metFORMIN (GLUCOPHAGE) 500 MG tablet Take 500 mg by mouth 2 (two) times daily with a meal.      . potassium chloride SA (K-DUR,KLOR-CON) 20 MEQ tablet Take 2 tablets (40 mEq total) by mouth daily.  180 tablet  3  .  trandolapril (MAVIK) 4 MG tablet Take 8 mg by mouth daily.       Marland Kitchen venlafaxine (EFFEXOR) 37.5 MG tablet Take 37.5 mg by mouth daily.       Marland Kitchen warfarin (COUMADIN) 2.5 MG tablet Take as directed by Anticoagulation clinic  100 tablet  1    History   Social History  . Marital Status: Married    Spouse Name: N/A    Number of Children: N/A  . 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     Social History Main Topics  . Smoking status: Never Smoker   . Smokeless tobacco: Never Used  . Alcohol Use: No  . Drug Use: No  . Sexually Active: No   Other Topics Concern  . Not on file   Social History Narrative   Does not et regular exerciseDaily caffeine use    Family History  Problem Relation Age of Onset  . Cancer Maternal Grandfather   . Heart disease Father   . Heart disease Sister   . Diabetes Mother   . Diabetes Father   . Diabetes Sister   .  Diabetes Paternal Grandfather   . Diabetes Paternal Grandmother   . Heart disease Paternal Uncle     Past Medical History  Diagnosis Date  . Atrial fibrillation     Treated with maze procedure / recurrent atrial fibrillation February, 2011... TEE cardioversion October 19, 2009  . Atrial tachycardia     February, 2011... Tikosyn started in hospital  . Bradycardia     Post cardioversion February, 2011,/digoxin/Cardizem metoprolol stopped, may need pacemaker  . Mitral regurgitation     Treated with repair, atrial appendage was removed or tied off at surgery.. this was proven by TEE February, 2011  . Pulmonary HTN     55 mmHg, echo, February, 2011 / no mention of pulmonary hypertension echo, June, 2011  . Right ventricular dysfunction     Mild to moderate, echo, February, 2011 / normalized echo, June, 2011  . TR (tricuspid regurgitation)     Moderate, echo, February, 2011 / trivial, echo, June, 2011  . Mitral stenosis     Mild, February, 2011, post mitral valve repair / mild, echo, June,  2011  . CAD (coronary artery disease)     LIMA to the LAD at time of mitral valve repair / LIMA atretic,, February, 2011  . Ejection fraction < 50%     EF 30% echo and TEE diagnosis February, 2011, possibly rate related / no contraction by catheter, bradycardia so carvedilol cannot be used / EF 55-60% echo, June, 2011 / EF 50%, echo, February, 2012  . HTN (hypertension)   . Hemorrhoids, internal   . Diverticulosis of colon   . Colon polyp, hyperplastic   . Environmental allergies   . atrial appendage     Removed were tied off at surgery, proven by TEE February, 2011  . S/P mitral valve repair     [facility] / Maze procedure/ atrial appendage removed were tied off  . Hx of CABG     At time of mitral valve repair  . Rash     ?Vicodin, Lotensin, Toprol  . Drug therapy     Tikosyn,  stopped 02/10/2012  . QT prolongation     Tikosyn and Effexor. QT prolonged October 13, 2011, peak is in dose reduced from 500  to -250 twice a day  . H/O amiodarone therapy     Satrted 02/12/2012  . PONV (postoperative nausea and vomiting)   . Anginal pain   . Pacemaker     May 17, 2012 for  bradycardia and sick sinus syndrome  . Sick sinus syndrome     s/p Medtronic dual chamber PPM 05/17/12  . High cholesterol   . CHF (congestive heart failure) 2011    Mild in hospital February, 2011  . Asthma   . COPD (chronic obstructive pulmonary disease)   . Shortness of breath     Evaluated by Dr. Craige Cotta. Mat [id number].... bronchodilator response,,  medication started  /  patient much improved June 14, 201 2  . Type II diabetes mellitus   . Arthritis     "not bad; little in my hands; some in my knees" (05/28/2012)  . Acquired complete AV block     AV node ablation May 28, 2012    Past Surgical History  Procedure Date  . Atrial appendage removed   . Mitral valve repair 2001  . Maze 2001    w/ MVR & CABG  . Cardioversion 02/08/2012    Procedure: CARDIOVERSION;  Surgeon: [name], MD;  Location:  [facility] OR;  Service: Cardiovascular;  Laterality: N/A;  . Cardioversion 02/29/2012    Procedure: CARDIOVERSION;  Surgeon: [name], MD;  Location: [facility] OR;  Service: Cardiovascular;  Laterality: N/A;  . Cardioversion 05/15/2012    Procedure: CARDIOVERSION;  Surgeon: [name], MD;  Location: [facility] ENDOSCOPY;  Service: Cardiovascular;  Laterality: N/A;  . Cardiac catheterization   . Tonsillectomy and adenoidectomy ~ 1950  . Cholecystectomy ?1995  . Coronary artery bypass graft 2001    CABG X1  . Breast lumpectomy 1990's    bilaterally  . Insert / replace / remove pacemaker 05/17/2012    initial placement  . Abdominal hysterectomy ?2002    Patient Active Problem List  Diagnosis  . COLONIC POLYPS, HYPERPLASTIC  . DIABETES MELLITUS  . HYPERLIPIDEMIA  . HYPERTENSION  . INTERNAL HEMORRHOIDS  . DIVERTICULOSIS, COLON  . Atrial fibrillation  . Atrial tachycardia  . Bradycardia  . Mitral regurgitation  . Pulmonary HTN  . Right ventricular dysfunction  . TR (tricuspid regurgitation)  . Mitral stenosis  . CAD (coronary artery disease)  . Dizziness  . Warfarin anticoagulation  . atrial appendage  . S/P mitral valve repair  . Hx of CABG  . Rash  . Ejection fraction < 50%  . Shortness of breath  . Mild intermittent asthma  . QT prolongation  . H/O amiodarone therapy  . Drug therapy  . Allergic rhinitis  . Pacemaker-Medtronic  . Acquired complete AV block  . Sick sinus syndrome    ROS   Patient denies fever, chills, headache, sweats, rash, change in vision, change in hearing, chest pain, cough, nausea vomiting, urinary symptoms. All other systems are reviewed and are negative.  PHYSICAL EXAM  Patient is here with her husband. She is oriented to person time and place. Affect is normal. There is no jugular venous distention. Lungs are clear. Respiratory effort is nonlabored. Cardiac exam reveals an S1 and S2. The abdomen is soft. There is no significant peripheral edema. There no  musculoskeletal deformities. There are no skin rashes.  Filed Vitals:   08/06/12 1549 08/06/12 1641  BP: 134/68   Pulse: 94   Weight: 176 lb (79.833 kg)   SpO2:  96%   EKG was done and reviewed by me today. It reveals ventricular pacing with a rate of 94.  ASSESSMENT & PLAN

## 2012-08-07 LAB — CBC WITH DIFFERENTIAL/PLATELET
Basophils Relative: 1.1 % (ref 0.0–3.0)
Eosinophils Absolute: 0.1 10*3/uL (ref 0.0–0.7)
HCT: 32.8 % — ABNORMAL LOW (ref 36.0–46.0)
Hemoglobin: 10.3 g/dL — ABNORMAL LOW (ref 12.0–15.0)
MCHC: 31.6 g/dL (ref 30.0–36.0)
MCV: 83.5 fl (ref 78.0–100.0)
Monocytes Absolute: 0.8 10*3/uL (ref 0.1–1.0)
Neutro Abs: 4.5 10*3/uL (ref 1.4–7.7)
RBC: 3.93 Mil/uL (ref 3.87–5.11)

## 2012-08-07 LAB — BASIC METABOLIC PANEL
BUN: 16 mg/dL (ref 6–23)
Chloride: 106 mEq/L (ref 96–112)
Creatinine, Ser: 0.6 mg/dL (ref 0.4–1.2)
GFR: 98.57 mL/min (ref >60.00)

## 2012-08-08 ENCOUNTER — Ambulatory Visit (HOSPITAL_COMMUNITY): Payer: BC Managed Care – PPO | Attending: Cardiology | Admitting: Radiology

## 2012-08-08 DIAGNOSIS — R0602 Shortness of breath: Secondary | ICD-10-CM | POA: Insufficient documentation

## 2012-08-08 DIAGNOSIS — I2789 Other specified pulmonary heart diseases: Secondary | ICD-10-CM | POA: Insufficient documentation

## 2012-08-08 DIAGNOSIS — I079 Rheumatic tricuspid valve disease, unspecified: Secondary | ICD-10-CM | POA: Insufficient documentation

## 2012-08-08 DIAGNOSIS — I498 Other specified cardiac arrhythmias: Secondary | ICD-10-CM | POA: Insufficient documentation

## 2012-08-08 DIAGNOSIS — R0609 Other forms of dyspnea: Secondary | ICD-10-CM | POA: Insufficient documentation

## 2012-08-08 DIAGNOSIS — I517 Cardiomegaly: Secondary | ICD-10-CM | POA: Insufficient documentation

## 2012-08-08 DIAGNOSIS — I10 Essential (primary) hypertension: Secondary | ICD-10-CM | POA: Insufficient documentation

## 2012-08-08 DIAGNOSIS — I2510 Atherosclerotic heart disease of native coronary artery without angina pectoris: Secondary | ICD-10-CM | POA: Insufficient documentation

## 2012-08-08 DIAGNOSIS — R0600 Dyspnea, unspecified: Secondary | ICD-10-CM

## 2012-08-08 DIAGNOSIS — E785 Hyperlipidemia, unspecified: Secondary | ICD-10-CM | POA: Insufficient documentation

## 2012-08-08 DIAGNOSIS — I059 Rheumatic mitral valve disease, unspecified: Secondary | ICD-10-CM | POA: Insufficient documentation

## 2012-08-08 DIAGNOSIS — R0989 Other specified symptoms and signs involving the circulatory and respiratory systems: Secondary | ICD-10-CM | POA: Insufficient documentation

## 2012-08-08 DIAGNOSIS — I4891 Unspecified atrial fibrillation: Secondary | ICD-10-CM | POA: Insufficient documentation

## 2012-08-08 NOTE — Progress Notes (Signed)
Echocardiogram performed.  

## 2012-08-09 ENCOUNTER — Ambulatory Visit (INDEPENDENT_AMBULATORY_CARE_PROVIDER_SITE_OTHER)
Admission: RE | Admit: 2012-08-09 | Discharge: 2012-08-09 | Disposition: A | Discharge status: Home / Self Care | Payer: BC Managed Care – PPO | Source: Ambulatory Visit | Attending: Cardiology | Admitting: Cardiology

## 2012-08-09 ENCOUNTER — Encounter: Payer: Self-pay | Admitting: Cardiology

## 2012-08-09 DIAGNOSIS — R0609 Other forms of dyspnea: Secondary | ICD-10-CM

## 2012-08-09 DIAGNOSIS — R0600 Dyspnea, unspecified: Secondary | ICD-10-CM

## 2012-08-09 NOTE — Progress Notes (Signed)
   I recently saw the patient in the office. Early after her AV node ablation with a pacemaker in place she felt well. She now has marked exertional shortness of breath. Since her visit the other day labs show that her renal function is good. Her hemoglobin is down to approximately 10.4. This is not enough to explain her symptoms. Chest x-ray was to have been done but I do not see a result yet. Two-dimensional echo has been done. There has been significant worsening in her LV function. There is marked dyssynergy of her septum. There is also some decreased motion of other walls. She does have some functional mitral stenosis long after her mitral valve repair done surgically, but this mitral stenosis does not appear to be enough to be causing her current symptoms and would not explain a sudden change in the past few weeks.  When she was in the office her pacemaker was interrogated. She did not appear to have marked periods of prolonged significant tachycardia with marked increased rates. Her baseline rate was turned down 10 beats.During that visit she walked with an O2 sat monitor. Her O2 sat remained in the 95-96 range. When she walked her heart rate appeared to be in the range of 95. It actually increased to approximately 105 as she stopped walking.  I have considered whether her mild functional mitral stenosis could be playing a role. I have considered seeing whether Dr. Johney Frame feels that her rate responsive mode should be turned off to see if she feels better with a slow paced rate. She gets her symptoms with very little exertion.  It would seem that the most likely cause of her symptoms is her new worsening left ventricular function. There is also some right ventricular dysfunction. Her inferior vena cava collapses appropriately.  She does not appear to be volume overloaded. Previously she developed  Left ventricular dysfunction in 2011. At that time it was felt to be related to her atrial tachycardia.  After that time we treated her aggressively with Tikosyn and eventually amiodarone. Ultimately we think that she failed both of these and we are not able to maintain sinus rhythm. In addition cardiac catheterization was done in 2011 when she had left ventricular dysfunction. She had mild nonobstructive disease. There was no evidence of constriction. It was felt that she did have mild mitral stenosis at that time. The LIMA that had been placed to her LAD originally was atretic,  but she had good flow in her native LAD. It seems unlikely, but not impossible, that there could have been a change in her coronary status. However the timing of events still suggests change her LV function since her pacemaker was placed and her AV node was ablated. I cannot tell if there is a rate phenomenon playing a role. The other possibility is that all of these changes are related to the fact that she is paced 100% of the time.  I will review all of this data with Dr. Johney Frame. I will see if consideration can be given to turning off her rate responsive mode. The other possibility may be that she needs to have biventricular pacing. I'll also look into being sure that we obtain a chest x-ray.

## 2012-08-13 ENCOUNTER — Encounter: Payer: Self-pay | Admitting: Cardiology

## 2012-08-13 ENCOUNTER — Encounter (INDEPENDENT_AMBULATORY_CARE_PROVIDER_SITE_OTHER): Payer: BC Managed Care – PPO

## 2012-08-13 ENCOUNTER — Ambulatory Visit (INDEPENDENT_AMBULATORY_CARE_PROVIDER_SITE_OTHER): Payer: BC Managed Care – PPO | Admitting: Cardiology

## 2012-08-13 VITALS — BP 140/62 | HR 87 | Ht 63.0 in | Wt 178.0 lb

## 2012-08-13 DIAGNOSIS — I498 Other specified cardiac arrhythmias: Secondary | ICD-10-CM

## 2012-08-13 DIAGNOSIS — I4719 Other supraventricular tachycardia: Secondary | ICD-10-CM

## 2012-08-13 DIAGNOSIS — R0602 Shortness of breath: Secondary | ICD-10-CM

## 2012-08-13 DIAGNOSIS — I050 Rheumatic mitral stenosis: Secondary | ICD-10-CM

## 2012-08-13 DIAGNOSIS — I2510 Atherosclerotic heart disease of native coronary artery without angina pectoris: Secondary | ICD-10-CM

## 2012-08-13 DIAGNOSIS — I4891 Unspecified atrial fibrillation: Secondary | ICD-10-CM

## 2012-08-13 DIAGNOSIS — I471 Supraventricular tachycardia: Secondary | ICD-10-CM

## 2012-08-13 DIAGNOSIS — R9430 Abnormal result of cardiovascular function study, unspecified: Secondary | ICD-10-CM

## 2012-08-13 DIAGNOSIS — IMO0002 Reserved for concepts with insufficient information to code with codable children: Secondary | ICD-10-CM

## 2012-08-13 DIAGNOSIS — D649 Anemia, unspecified: Secondary | ICD-10-CM

## 2012-08-13 DIAGNOSIS — R0989 Other specified symptoms and signs involving the circulatory and respiratory systems: Secondary | ICD-10-CM

## 2012-08-13 NOTE — Assessment & Plan Note (Addendum)
Etiology of the current shortness of breath remains unclear. I am hopeful that it is possibly related to her pacing function and the change in her LV function with pacing. Today we turned her maximum pacing rate from 120-115 area but also turned the responsiveness mode down so that she will have to be more active to have an increase in heart rate. These were the only changes made today. I left her resting rate at 70. I plan to call her soon and then decide if I were going to add a beta blocker.  As part of today's evaluation I spent greater than 25 minutes. In fact I probably spent greater than an hour. More than half of the 25 minutes was spent speaking directly with the patient and her husband about all of the issues involved.

## 2012-08-13 NOTE — Assessment & Plan Note (Signed)
The patient had also had some atrial tachycardia. She has AV node ablation. This is no longer a problem.

## 2012-08-13 NOTE — Assessment & Plan Note (Signed)
She has some coronary disease. In February, 2011 the LIMA was atretic but the LAD was patent. She had no major disease at that time. She has new left ventricular dysfunction since her AV nodal ablation was done and her pacemaker was placed. I am not convinced that she's had a coronary event. However we may need to proceed with a nuclear study or catheterization to be complete.

## 2012-08-13 NOTE — Assessment & Plan Note (Signed)
Etiology of her new left ventricle are dysfunction is not clear. It has occurred around the time of the pacemaker placement. It's possibly related to pacing function. Will need to consider coronary disease. She is already on an ACE inhibitor. I will consider adding a beta blocker. She has been off a beta blocker for many years because of her bradycardia. However she did have some beta-blocking effect from her amiodarone that has been stopped now since her pacemaker was placed. Also she has some reactive airway disease. I may choose metoprolol and small dose in the near future.

## 2012-08-13 NOTE — Assessment & Plan Note (Signed)
The patient does have some mild functional mitral stenosis. This may play a role in why she feels poorly when her heart rate goes up.

## 2012-08-13 NOTE — Assessment & Plan Note (Signed)
We know that the patient has atrial fibrillation that is isolated to her atria. She has AV nodal ablation. She is on Coumadin. However we know that her atrial appendage was removed at the time of her mitral valve surgery many years ago.

## 2012-08-13 NOTE — Patient Instructions (Addendum)
Your physician recommends that you continue on your current medications as directed. Please refer to the Current Medication list given to you today.  Your physician recommends that you schedule a follow-up appointment in: Keep your f/u appt with Dr Myrtis Ser.

## 2012-08-13 NOTE — Progress Notes (Signed)
HPI  The patient is seen for followup her shortness of breath with exertion. I saw her recently. Also I summarized her information in a documentation note. In addition I've spoken with Dr. Johney Frame on 2 occasions. In addition I've spoken with the patient at home and I decided to bring her in for followup today. When she was here last we turned the resting rate on her pacemaker down from 80-70. She says that she might feel slightly better. However she is not walking at all. She's not having any PND orthopnea.  Allergies  Allergen Reactions  . Meperidine Hcl Nausea And Vomiting  . Morphine Other (See Comments)    vomiting  . Tetanus Toxoid Rash    "years ago"    Current Outpatient Prescriptions  Medication Sig Dispense Refill  . acetaminophen (TYLENOL) 500 MG tablet Take 1,000 mg by mouth every 6 (six) hours as needed. For pain      . albuterol (PROVENTIL HFA;VENTOLIN HFA) 108 (90 BASE) MCG/ACT inhaler Inhale 2 puffs into the lungs every 6 (six) hours as needed. For shortness of breath      . aspirin 81 MG tablet Take 81 mg by mouth daily.        Marland Kitchen atorvastatin (LIPITOR) 40 MG tablet Take 40 mg by mouth daily.      . calcium carbonate (OS-CAL) 600 MG TABS Take 600 mg by mouth daily.       . cholecalciferol (VITAMIN D) 1000 UNITS tablet Take 4,000 Units by mouth daily.      . fish oil-omega-3 fatty acids 1000 MG capsule Take 2 g by mouth 2 (two) times daily.        . furosemide (LASIX) 40 MG tablet Take 20 mg by mouth daily.      . metFORMIN (GLUCOPHAGE) 500 MG tablet Take 500 mg by mouth 2 (two) times daily with a meal.      . potassium chloride SA (K-DUR,KLOR-CON) 20 MEQ tablet Take 2 tablets (40 mEq total) by mouth daily.  180 tablet  3  . trandolapril (MAVIK) 4 MG tablet Take 8 mg by mouth daily.       Marland Kitchen venlafaxine (EFFEXOR) 37.5 MG tablet Take 37.5 mg by mouth daily.       Marland Kitchen warfarin (COUMADIN) 2.5 MG tablet Take as directed by Anticoagulation clinic  100 tablet  1    History    Social History  . Marital Status: Married    Spouse Name: N/A    Number of Children: N/A  . 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     Social History Main Topics  . Smoking status: Never Smoker   . Smokeless tobacco: Never Used  . Alcohol Use: No  . Drug Use: No  . Sexually Active: No   Other Topics Concern  . Not on file   Social History Narrative   Does not et regular exerciseDaily caffeine use    Family History  Problem Relation Age of Onset  . Cancer Maternal Grandfather   . Heart disease Father   . Heart disease Sister   . Diabetes Mother   . Diabetes Father   . Diabetes Sister   . Diabetes Paternal Grandfather   . Diabetes Paternal Grandmother   . Heart disease Paternal Uncle     Past Medical History  Diagnosis Date  . Atrial fibrillation     Treated with maze procedure / recurrent  atrial fibrillation February, 2011... TEE cardioversion October 19, 2009  . Atrial tachycardia     February, 2011... Tikosyn started in hospital  . Bradycardia     Post cardioversion February, 2011,/digoxin/Cardizem metoprolol stopped, may need pacemaker  . Mitral regurgitation     Treated with repair, atrial appendage was removed or tied off at surgery.. this was proven by TEE February, 2011  . Pulmonary HTN     55 mmHg, echo, February, 2011 / no mention of pulmonary hypertension echo, June, 2011  . Right ventricular dysfunction     Mild to moderate, echo, February, 2011 / normalized echo, June, 2011  . TR (tricuspid regurgitation)     Moderate, echo, February, 2011 / trivial, echo, June, 2011  . Mitral stenosis     Mild, February, 2011, post mitral valve repair / mild, echo, June, 2011  . CAD (coronary artery disease)     LIMA to the LAD at time of mitral valve repair / LIMA atretic,, February, 2011  . Ejection fraction < 50%     EF 30% echo and TEE diagnosis February, 2011, possibly rate related / no contraction by  catheter, bradycardia so carvedilol cannot be used / EF 55-60% echo, June, 2011 / EF 50%, echo, February, 2012  . HTN (hypertension)   . Hemorrhoids, internal   . Diverticulosis of colon   . Colon polyp, hyperplastic   . Environmental allergies   . atrial appendage     Removed were tied off at surgery, proven by TEE February, 2011  . S/P mitral valve repair     [facility] / Maze procedure/ atrial appendage removed were tied off  . Hx of CABG     At time of mitral valve repair  . Rash     ?Vicodin, Lotensin, Toprol  . Drug therapy     Tikosyn,  stopped 02/10/2012  . QT prolongation     Tikosyn and Effexor. QT prolonged October 13, 2011, peak is in dose reduced from 500  to -250 twice a day  . H/O amiodarone therapy     Satrted 02/12/2012  . PONV (postoperative nausea and vomiting)   . Anginal pain   . Pacemaker     May 17, 2012 for  bradycardia and sick sinus syndrome  . Sick sinus syndrome     s/p Medtronic dual chamber PPM 05/17/12  . High cholesterol   . CHF (congestive heart failure) 2011    Mild in hospital February, 2011  . Asthma   . COPD (chronic obstructive pulmonary disease)   . Shortness of breath     Evaluated by Dr. Craige Cotta. Mat [id number].... bronchodilator response,,  medication started  /  patient much improved June 14, 201 2  . Type II diabetes mellitus   . Arthritis     "not bad; little in my hands; some in my knees" (05/28/2012)  . Acquired complete AV block     AV node ablation May 28, 2012    Past Surgical History  Procedure Date  . Atrial appendage removed   . Mitral valve repair 2001  . Maze 2001    w/ MVR & CABG  . Cardioversion 02/08/2012    Procedure: CARDIOVERSION;  Surgeon: [name], MD;  Location: [facility] OR;  Service: Cardiovascular;  Laterality: N/A;  . Cardioversion 02/29/2012    Procedure: CARDIOVERSION;  Surgeon: [name], MD;  Location: [facility] OR;  Service: Cardiovascular;  Laterality: N/A;  . Cardioversion 05/15/2012    Procedure:  CARDIOVERSION;  Surgeon: [name], MD;  Location: [facility] ENDOSCOPY;  Service: Cardiovascular;  Laterality: N/A;  . Cardiac catheterization   . Tonsillectomy and adenoidectomy ~ 1950  . Cholecystectomy ?1995  . Coronary artery bypass graft 2001    CABG X1  . Breast lumpectomy 1990's    bilaterally  . Insert / replace / remove pacemaker 05/17/2012    initial placement  . Abdominal hysterectomy ?2002    Patient Active Problem List  Diagnosis  . COLONIC POLYPS, HYPERPLASTIC  . DIABETES MELLITUS  . HYPERLIPIDEMIA  . HYPERTENSION  . INTERNAL HEMORRHOIDS  . DIVERTICULOSIS, COLON  . Atrial fibrillation  . Atrial tachycardia  . Bradycardia  . Mitral regurgitation  . Pulmonary HTN  . Right ventricular dysfunction  . TR (tricuspid regurgitation)  . Mitral stenosis  . CAD (coronary artery disease)  . Dizziness  . Warfarin anticoagulation  . atrial appendage  . S/P mitral valve repair  . Hx of CABG  . Rash  . Ejection fraction < 50%  . Shortness of breath  . Mild intermittent asthma  . QT prolongation  . H/O amiodarone therapy  . Drug therapy  . Allergic rhinitis  . Pacemaker-Medtronic  . Acquired complete AV block  . Sick sinus syndrome    ROS   Patient denies fever, chills, headache, sweats, rash, change in vision, change in hearing, chest pain, cough, nausea vomiting, urinary symptoms. All other systems are reviewed and are negative.  PHYSICAL EXAM  Patient is here with her husband. She is oriented to person time and place. Affect is normal. There is no jugulovenous distention. Lungs are clear. Respiratory effort is nonlabored. Cardiac exam reveals S1 and S2. There no clicks or significant murmurs. The abdomen is soft. There is no peripheral edema. There no musculoskeletal deformities. There are no skin rashes.  Filed Vitals:   08/13/12 1537  BP: 140/62  Pulse: 87  Height: 5\' 3"  (1.6 m)  Weight: 178 lb (80.74 kg)  SpO2: 98%   Her pacemaker was interrogated today.  60% of the time she is pacing in her base rate of 70. 35% of the time she is between 80 and 100. Small percent of the time she is over 100.  ASSESSMENT & PLAN

## 2012-08-13 NOTE — Assessment & Plan Note (Signed)
Labs were checked very recently. Her renal function is stable. Her hemoglobin is decreased at 10.3. We will look into arrangements to let Dr. Oneta Rack know about this and asked him to help with the workup.

## 2012-08-14 ENCOUNTER — Encounter: Payer: Self-pay | Admitting: Internal Medicine

## 2012-08-14 LAB — PACEMAKER DEVICE OBSERVATION

## 2012-08-15 ENCOUNTER — Telehealth: Payer: Self-pay | Admitting: Cardiology

## 2012-08-15 NOTE — Telephone Encounter (Signed)
plz return call to pt [phone] regarding pacer adjustment

## 2012-08-15 NOTE — Telephone Encounter (Signed)
Anita Duke called to let Dr Myrtis Ser know that she is feeling much better after having her pacer adjusted.

## 2012-08-19 ENCOUNTER — Ambulatory Visit (INDEPENDENT_AMBULATORY_CARE_PROVIDER_SITE_OTHER): Payer: BC Managed Care – PPO | Admitting: Cardiology

## 2012-08-19 ENCOUNTER — Encounter: Payer: Self-pay | Admitting: Cardiology

## 2012-08-19 VITALS — BP 132/50 | HR 83 | Ht 63.0 in | Wt 178.0 lb

## 2012-08-19 DIAGNOSIS — I10 Essential (primary) hypertension: Secondary | ICD-10-CM

## 2012-08-19 DIAGNOSIS — Z950 Presence of cardiac pacemaker: Secondary | ICD-10-CM

## 2012-08-19 DIAGNOSIS — R0602 Shortness of breath: Secondary | ICD-10-CM

## 2012-08-19 DIAGNOSIS — D649 Anemia, unspecified: Secondary | ICD-10-CM

## 2012-08-19 NOTE — Assessment & Plan Note (Signed)
The patient's feeling much better. We had adjusted her pacer last week. No change will be made this week. She is scheduled to see Dr. Johney Frame next week. After that visit I will make further decisions about whether we will add carvedilol with her LV dysfunction.

## 2012-08-19 NOTE — Patient Instructions (Signed)
Your physician recommends that you schedule a follow-up appointment in: January

## 2012-08-19 NOTE — Assessment & Plan Note (Signed)
I will need to arrange for the patient to see her primary doctor to followup her hemoglobin.

## 2012-08-19 NOTE — Assessment & Plan Note (Signed)
The patient's shortness of breath with exercise is greatly improved. Temporally this is related to the adjustment of her pacemaker settings. Of course it is difficult to prove. She feels much better.

## 2012-08-19 NOTE — Assessment & Plan Note (Signed)
Blood pressure is controlled. No change in therapy. 

## 2012-08-19 NOTE — Progress Notes (Signed)
HPI  The patient is seen to followup shortness of breath and atrial arrhythmias. I saw her last August 13, 2012. We changed her pacemaker setting. Her base rate was less than 70. She was changed from medium/low to medium/high setting on the requirements for her exercise level before her pacemaker rate increased. Her peak rate was also turned down 115. I had considered adding a beta blocker after several days. She says she feels much better.  Allergies  Allergen Reactions  . Meperidine Hcl Nausea And Vomiting  . Morphine Other (See Comments)    vomiting  . Tetanus Toxoid Rash    "years ago"    Current Outpatient Prescriptions  Medication Sig Dispense Refill  . acetaminophen (TYLENOL) 500 MG tablet Take 1,000 mg by mouth every 6 (six) hours as needed. For pain      . albuterol (PROVENTIL HFA;VENTOLIN HFA) 108 (90 BASE) MCG/ACT inhaler Inhale 2 puffs into the lungs every 6 (six) hours as needed. For shortness of breath      . aspirin 81 MG tablet Take 81 mg by mouth daily.        Marland Kitchen atorvastatin (LIPITOR) 40 MG tablet Take 40 mg by mouth daily.      . calcium carbonate (OS-CAL) 600 MG TABS Take 600 mg by mouth daily.       . cholecalciferol (VITAMIN D) 1000 UNITS tablet Take 4,000 Units by mouth daily.      . fish oil-omega-3 fatty acids 1000 MG capsule Take 2 g by mouth 2 (two) times daily.        . furosemide (LASIX) 40 MG tablet Take 20 mg by mouth daily.      . metFORMIN (GLUCOPHAGE) 500 MG tablet Take 500 mg by mouth 2 (two) times daily with a meal.      . potassium chloride SA (K-DUR,KLOR-CON) 20 MEQ tablet Take 2 tablets (40 mEq total) by mouth daily.  180 tablet  3  . trandolapril (MAVIK) 4 MG tablet Take 8 mg by mouth daily.       Marland Kitchen venlafaxine (EFFEXOR) 37.5 MG tablet Take 37.5 mg by mouth daily.       Marland Kitchen warfarin (COUMADIN) 2.5 MG tablet Take as directed by Anticoagulation clinic  100 tablet  1    History   Social History  . Marital Status: Married    Spouse Name: N/A   Number of Children: N/A  . 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     Social History Main Topics  . Smoking status: Never Smoker   . Smokeless tobacco: Never Used  . Alcohol Use: No  . Drug Use: No  . Sexually Active: No   Other Topics Concern  . Not on file   Social History Narrative   Does not et regular exerciseDaily caffeine use    Family History  Problem Relation Age of Onset  . Cancer Maternal Grandfather   . Heart disease Father   . Heart disease Sister   . Diabetes Mother   . Diabetes Father   . Diabetes Sister   . Diabetes Paternal Grandfather   . Diabetes Paternal Grandmother   . Heart disease Paternal Uncle     Past Medical History  Diagnosis Date  . Atrial fibrillation     Treated with maze procedure / recurrent atrial fibrillation February, 2011... TEE cardioversion October 19, 2009  . Atrial tachycardia     February,  2011... Tikosyn started in hospital  . Bradycardia     Post cardioversion February, 2011,/digoxin/Cardizem metoprolol stopped, may need pacemaker  . Mitral regurgitation     Treated with repair, atrial appendage was removed or tied off at surgery.. this was proven by TEE February, 2011  . Pulmonary HTN     55 mmHg, echo, February, 2011 / no mention of pulmonary hypertension echo, June, 2011  . Right ventricular dysfunction     Mild to moderate, echo, February, 2011 / normalized echo, June, 2011  . TR (tricuspid regurgitation)     Moderate, echo, February, 2011 / trivial, echo, June, 2011  . Mitral stenosis     Mild, February, 2011, post mitral valve repair / mild, echo, June, 2011  . CAD (coronary artery disease)     LIMA to the LAD at time of mitral valve repair / LIMA atretic,, February, 2011  . Ejection fraction < 50%     EF 30% echo and TEE diagnosis February, 2011, possibly rate related / no contraction by catheter, bradycardia so carvedilol cannot be used / EF 55-60% echo, June, 2011  / EF 50%, echo, February, 2012  . HTN (hypertension)   . Hemorrhoids, internal   . Diverticulosis of colon   . Colon polyp, hyperplastic   . Environmental allergies   . atrial appendage     Removed were tied off at surgery, proven by TEE February, 2011  . S/P mitral valve repair     [facility] / Maze procedure/ atrial appendage removed were tied off  . Hx of CABG     At time of mitral valve repair  . Rash     ?Vicodin, Lotensin, Toprol  . Drug therapy     Tikosyn,  stopped 02/10/2012  . QT prolongation     Tikosyn and Effexor. QT prolonged October 13, 2011, peak is in dose reduced from 500  to -250 twice a day  . H/O amiodarone therapy     Satrted 02/12/2012  . PONV (postoperative nausea and vomiting)   . Anginal pain   . Pacemaker     May 17, 2012 for  bradycardia and sick sinus syndrome  . Sick sinus syndrome     s/p Medtronic dual chamber PPM 05/17/12  . High cholesterol   . CHF (congestive heart failure) 2011    Mild in hospital February, 2011  . Asthma   . COPD (chronic obstructive pulmonary disease)   . Shortness of breath     Evaluated by Dr. Craige Cotta. Mat [id number].... bronchodilator response,,  medication started  /  patient much improved June 14, 201 2  . Type II diabetes mellitus   . Arthritis     "not bad; little in my hands; some in my knees" (05/28/2012)  . Acquired complete AV block     AV node ablation May 28, 2012  . Anemia     Hemoglobin 10.4, December, 2013    Past Surgical History  Procedure Date  . Atrial appendage removed   . Mitral valve repair 2001  . Maze 2001    w/ MVR & CABG  . Cardioversion 02/08/2012    Procedure: CARDIOVERSION;  Surgeon: [name], MD;  Location: [facility] OR;  Service: Cardiovascular;  Laterality: N/A;  . Cardioversion 02/29/2012    Procedure: CARDIOVERSION;  Surgeon: [name], MD;  Location: [facility] OR;  Service: Cardiovascular;  Laterality: N/A;  . Cardioversion 05/15/2012    Procedure: CARDIOVERSION;  Surgeon:  [name], MD;  Location: [facility] ENDOSCOPY;  Service: Cardiovascular;  Laterality: N/A;  . Cardiac catheterization   . Tonsillectomy and adenoidectomy ~ 1950  . Cholecystectomy ?1995  . Coronary artery bypass graft 2001    CABG X1  . Breast lumpectomy 1990's    bilaterally  . Insert / replace / remove pacemaker 05/17/2012    initial placement  . Abdominal hysterectomy ?2002    Patient Active Problem List  Diagnosis  . COLONIC POLYPS, HYPERPLASTIC  . DIABETES MELLITUS  . HYPERLIPIDEMIA  . HYPERTENSION  . INTERNAL HEMORRHOIDS  . DIVERTICULOSIS, COLON  . Atrial fibrillation  . Atrial tachycardia  . Bradycardia  . Mitral regurgitation  . Pulmonary HTN  . Right ventricular dysfunction  . TR (tricuspid regurgitation)  . Mitral stenosis  . CAD (coronary artery disease)  . Dizziness  . Warfarin anticoagulation  . atrial appendage  . S/P mitral valve repair  . Hx of CABG  . Rash  . Ejection fraction < 50%  . Shortness of breath  . Mild intermittent asthma  . QT prolongation  . H/O amiodarone therapy  . Drug therapy  . Allergic rhinitis  . Pacemaker-Medtronic  . Acquired complete AV block  . Sick sinus syndrome  . Anemia    ROS  Patient denies fever, chills, headache, sweats, rash, change in vision, change in hearing, chest pain, cough, nausea vomiting, urinary symptoms. All other systems are reviewed and are negative.  PHYSICAL EXAM  Patient is oriented to person time and place. Affect is normal. There is no jugulovenous distention. Lungs are clear. Respiratory effort is nonlabored. Cardiac exam reveals S1 and S2. There are no clicks or significant murmurs. The abdomen is soft. There is no peripheral edema.  Filed Vitals:   08/19/12 1022  BP: 132/50  Pulse: 83  Height: 5\' 3"  (1.6 m)  Weight: 178 lb (80.74 kg)  SpO2: 98%   EKG was not done. There was full interrogation of her pacemaker.  ASSESSMENT & PLAN

## 2012-08-22 ENCOUNTER — Encounter: Payer: Self-pay | Admitting: Internal Medicine

## 2012-08-23 ENCOUNTER — Encounter: Payer: Self-pay | Admitting: Internal Medicine

## 2012-08-23 ENCOUNTER — Encounter: Payer: BC Managed Care – PPO | Admitting: Internal Medicine

## 2012-08-23 ENCOUNTER — Ambulatory Visit (INDEPENDENT_AMBULATORY_CARE_PROVIDER_SITE_OTHER): Payer: Medicare Other | Admitting: Internal Medicine

## 2012-08-23 VITALS — BP 132/68 | HR 84 | Ht 63.0 in | Wt 179.0 lb

## 2012-08-23 DIAGNOSIS — R0602 Shortness of breath: Secondary | ICD-10-CM

## 2012-08-23 DIAGNOSIS — I442 Atrioventricular block, complete: Secondary | ICD-10-CM

## 2012-08-23 DIAGNOSIS — I4891 Unspecified atrial fibrillation: Secondary | ICD-10-CM

## 2012-08-23 LAB — PACEMAKER DEVICE OBSERVATION
BMOD-0005RV: 100 {beats}/min
BRDY-0004RV: 120 {beats}/min
RV LEAD IMPEDENCE PM: 650 Ohm
VENTRICULAR PACING PM: 91.6

## 2012-08-23 NOTE — Patient Instructions (Signed)
Your physician wants you to follow-up in: September with Dr Allred.  You will receive a reminder letter in the mail two months in advance. If you don't receive a letter, please call our office to schedule the follow-up appointment.  

## 2012-08-25 ENCOUNTER — Encounter: Payer: Self-pay | Admitting: Internal Medicine

## 2012-08-25 NOTE — Assessment & Plan Note (Signed)
I think that the patient has improved somewhat s/p AV nodal ablation and PPM.  She is certainly no worse.  Her palpitations have resolved and energy appears better.  She continues to struggle with SOB (this proceeded her EP procedures).  This has improved to a degree with PPM.  I suspect that this will be a chronic issue to some degree. I do not think that any further EP intervention would prove beneficial at this point.  She will continue to follow closely with Dr Myrtis Ser.  I will see her in 9 months.

## 2012-08-25 NOTE — Assessment & Plan Note (Signed)
Permanent atrial fibrillation She is appropriately anticoagulated with coumadin She is well rate controlled off of any AV nodal agents at this time s/p AV nodal ablation

## 2012-08-25 NOTE — Progress Notes (Signed)
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 female who presents today for routine electrophysiology followup.  Since having her pacemaker implanted and subsequent AV nodal ablation, the patient reports doing reasonably well.  Her palpitations have resolved.  It appears that her energy has improved.  She has been followed closely by Dr Myrtis Ser and recently her rate response was adjusted.  With this her SOB has improved.  She continues to have slow improvement in exercise tolerance.  Today, she denies symptoms of palpitations, chest pain, lower extremity edema, dizziness, presyncope, or syncope.  The patient is otherwise without complaint today.   Past Medical History  Diagnosis Date  . Atrial fibrillation     Treated with maze procedure / recurrent atrial fibrillation February, 2011... TEE cardioversion October 19, 2009  . Atrial tachycardia     February, 2011... Tikosyn started in hospital  . Bradycardia     Post cardioversion February, 2011,/digoxin/Cardizem metoprolol stopped, may need pacemaker  . Mitral regurgitation     Treated with repair, atrial appendage was removed or tied off at surgery.. this was proven by TEE February, 2011  . Pulmonary HTN     55 mmHg, echo, February, 2011 / no mention of pulmonary hypertension echo, June, 2011  . Right ventricular dysfunction     Mild to moderate, echo, February, 2011 / normalized echo, June, 2011  . TR (tricuspid regurgitation)     Moderate, echo, February, 2011 / trivial, echo, June, 2011  . Mitral stenosis     Mild, February, 2011, post mitral valve repair / mild, echo, June, 2011  . CAD (coronary artery disease)     LIMA to the LAD at time of mitral valve repair / LIMA atretic,, February, 2011  . Ejection fraction < 50%     EF 30% echo and TEE diagnosis February, 2011, possibly rate related / no contraction by catheter, bradycardia so carvedilol cannot be used / EF 55-60% echo, June, 2011 / EF  50%, echo, February, 2012  . HTN (hypertension)   . Hemorrhoids, internal   . Diverticulosis of colon   . Colon polyp, hyperplastic   . Environmental allergies   . atrial appendage     Removed were tied off at surgery, proven by TEE February, 2011  . S/P mitral valve repair     [facility] / Maze procedure/ atrial appendage removed were tied off  . Hx of CABG     At time of mitral valve repair  . Rash     ?Vicodin, Lotensin, Toprol  . Drug therapy     Tikosyn,  stopped 02/10/2012  . QT prolongation     Tikosyn and Effexor. QT prolonged October 13, 2011, peak is in dose reduced from 500  to -250 twice a day  . H/O amiodarone therapy     Satrted 02/12/2012  . PONV (postoperative nausea and vomiting)   . Anginal pain   . Pacemaker     May 17, 2012 for  bradycardia and sick sinus syndrome  . Sick sinus syndrome     s/p Medtronic dual chamber PPM 05/17/12  . High cholesterol   . CHF (congestive heart failure) 2011    Mild in hospital February, 2011  . Asthma   . COPD (chronic obstructive pulmonary disease)   . Shortness of breath     Evaluated by Dr. Craige Cotta. Mat [id number].... bronchodilator response,,  medication started  /  patient much improved June 14, 201  2  . Type II diabetes mellitus   . Arthritis     "not bad; little in my hands; some in my knees" (05/28/2012)  . Acquired complete AV block     AV node ablation May 28, 2012  . Anemia     Hemoglobin 10.4, December, 2013   Past Surgical History  Procedure Date  . Atrial appendage removed   . Mitral valve repair 2001  . Maze 2001    w/ MVR & CABG  . Cardioversion 02/08/2012    Procedure: CARDIOVERSION;  Surgeon: [name], MD;  Location: [facility] OR;  Service: Cardiovascular;  Laterality: N/A;  . Cardioversion 02/29/2012    Procedure: CARDIOVERSION;  Surgeon: [name], MD;  Location: [facility] OR;  Service: Cardiovascular;  Laterality: N/A;  . Cardioversion 05/15/2012    Procedure: CARDIOVERSION;  Surgeon: [name], MD;  Location: [facility] ENDOSCOPY;  Service: Cardiovascular;  Laterality: N/A;  . Cardiac catheterization   . Tonsillectomy and adenoidectomy ~ 1950  . Cholecystectomy ?1995  . Coronary artery bypass graft 2001    CABG X1  . Breast lumpectomy 1990's    bilaterally  . Pacemaker insertion 05/17/2012    MDT Adapta L implanted by Dr Johney Frame for tachy/brady syndrome  . Abdominal hysterectomy ?2002  . Av nodal ablation 05/28/12    Current Outpatient Prescriptions  Medication Sig Dispense Refill  . acetaminophen (TYLENOL) 500 MG tablet Take 1,000 mg by mouth every 6 (six) hours as needed. For pain      . albuterol (PROVENTIL HFA;VENTOLIN HFA) 108 (90 BASE) MCG/ACT inhaler Inhale 2 puffs into the lungs every 6 (six) hours as needed. For shortness of breath      . aspirin 81 MG tablet Take 81 mg by mouth daily.        Marland Kitchen atorvastatin (LIPITOR) 40 MG tablet Take 40 mg by mouth daily.      . calcium carbonate (OS-CAL) 600 MG TABS Take 600 mg by mouth daily.       . cholecalciferol (VITAMIN D) 1000 UNITS tablet Take 4,000 Units by mouth daily.      . fish oil-omega-3 fatty acids 1000 MG capsule Take 2 g by mouth 2 (two) times daily.        . furosemide (LASIX) 40 MG tablet Take 20 mg by mouth daily.      . metFORMIN (GLUCOPHAGE) 500 MG tablet Take 500 mg by mouth 2 (two) times daily with a meal.      . potassium chloride SA (K-DUR,KLOR-CON) 20 MEQ tablet Take 2 tablets (40 mEq total) by mouth daily.  180 tablet  3  . trandolapril (MAVIK) 4 MG tablet Take 8 mg by mouth daily.       Marland Kitchen venlafaxine (EFFEXOR) 37.5 MG tablet Take 37.5 mg by mouth daily.       Marland Kitchen warfarin (COUMADIN) 2.5 MG tablet Take as directed by Anticoagulation clinic  100 tablet  1    Physical Exam: Filed Vitals:   08/23/12 1040  BP: 132/68  Pulse: 84  Height: 5\' 3"  (1.6 m)  Weight: 179 lb (81.194 kg)  SpO2: 96%    GEN- The patient is chronically ill and overweight appearing, alert and oriented x 3 today.   Head-  normocephalic, atraumatic Eyes-  Sclera clear, conjunctiva pink Ears- hearing intact Oropharynx- clear Lungs- Clear to ausculation bilaterally, normal work of breathing Chest- pacemaker pocket is well healed Heart- Regular rate and rhythm (paced) GI- soft, NT, ND, + BS Extremities-  no clubbing, cyanosis, or edema  Pacemaker interrogation- reviewed in detail today,  See PACEART report Echo 12/13 is reviewed today  Assessment and Plan:

## 2012-08-25 NOTE — Assessment & Plan Note (Signed)
Normal pacemaker function See Pace Art report No changes today  Rate response is appropriate No changes are required

## 2012-08-29 ENCOUNTER — Ambulatory Visit: Payer: BC Managed Care – PPO | Admitting: Cardiology

## 2012-09-04 DIAGNOSIS — C180 Malignant neoplasm of cecum: Secondary | ICD-10-CM

## 2012-09-04 HISTORY — DX: Malignant neoplasm of cecum: C18.0

## 2012-09-04 HISTORY — PX: COLON SURGERY: SHX602

## 2012-09-12 ENCOUNTER — Ambulatory Visit (INDEPENDENT_AMBULATORY_CARE_PROVIDER_SITE_OTHER): Payer: BC Managed Care – PPO | Admitting: *Deleted

## 2012-09-12 ENCOUNTER — Encounter: Payer: Self-pay | Admitting: Gastroenterology

## 2012-09-12 ENCOUNTER — Ambulatory Visit (INDEPENDENT_AMBULATORY_CARE_PROVIDER_SITE_OTHER): Payer: BC Managed Care – PPO | Admitting: Cardiology

## 2012-09-12 ENCOUNTER — Encounter: Payer: Self-pay | Admitting: Physician Assistant

## 2012-09-12 ENCOUNTER — Encounter: Payer: Self-pay | Admitting: Cardiology

## 2012-09-12 VITALS — BP 140/70 | HR 78 | Ht 63.0 in | Wt 179.0 lb

## 2012-09-12 DIAGNOSIS — I4891 Unspecified atrial fibrillation: Secondary | ICD-10-CM

## 2012-09-12 DIAGNOSIS — Z7901 Long term (current) use of anticoagulants: Secondary | ICD-10-CM

## 2012-09-12 DIAGNOSIS — Z8679 Personal history of other diseases of the circulatory system: Secondary | ICD-10-CM

## 2012-09-12 DIAGNOSIS — R0602 Shortness of breath: Secondary | ICD-10-CM

## 2012-09-12 DIAGNOSIS — I050 Rheumatic mitral stenosis: Secondary | ICD-10-CM

## 2012-09-12 DIAGNOSIS — Z950 Presence of cardiac pacemaker: Secondary | ICD-10-CM

## 2012-09-12 DIAGNOSIS — J45909 Unspecified asthma, uncomplicated: Secondary | ICD-10-CM

## 2012-09-12 DIAGNOSIS — D649 Anemia, unspecified: Secondary | ICD-10-CM

## 2012-09-12 DIAGNOSIS — R0989 Other specified symptoms and signs involving the circulatory and respiratory systems: Secondary | ICD-10-CM

## 2012-09-12 DIAGNOSIS — I498 Other specified cardiac arrhythmias: Secondary | ICD-10-CM

## 2012-09-12 DIAGNOSIS — I2789 Other specified pulmonary heart diseases: Secondary | ICD-10-CM

## 2012-09-12 DIAGNOSIS — I10 Essential (primary) hypertension: Secondary | ICD-10-CM

## 2012-09-12 MED ORDER — CARVEDILOL 3.125 MG PO TABS: 3.1250 mg | ORAL_TABLET | Freq: Two times a day (BID) | ORAL | Status: DC

## 2012-09-12 NOTE — Assessment & Plan Note (Signed)
The patient has underlying atrial fib. Her atrial appendage was tied off at the time of her mitral surgery. She does use Coumadin on an extra protective basis. Her atrial fib no longer plays a role with her ventricular rate because of her AV node ablation.

## 2012-09-12 NOTE — Assessment & Plan Note (Signed)
The patient is able to walk around between rooms with no difficulty. There is no PND or orthopnea. Factors that may play a role include pacing function and rate response to her pacemaker, mitral stenosis, decreased diffusion capacity of her lungs, anemia, decreased left ventricular function. We do not think that ischemia is playing a role. For the next that but I've chosen to put her on a small dose of beta blocker to see if her heart rate can be slowed. I'm not sure how effective this will be with an AV node ablation and a pacemaker. She felt poorly with metoprolol in the remote past. Therefore carvedilol will be used. This will also be good to use with some left ventricular dysfunction. I will see how she responds to this. Then I will look into whether she can have further reprogramming of her pacemaker and also whether she should have repeat pulmonary function studies. A cardiopulmonary exercise test may help.

## 2012-09-12 NOTE — Progress Notes (Signed)
HPI  Patient is seen to followup shortness of breath, mitral valve disease, atrial arrhythmias. The patient has a pacemaker and AV node ablation. She is also status post mitral valve repair with some mild mitral stenosis at this time. When I saw her in early December we adjusted the response of her pacemaker so she had less increased heart rate. When I saw her August 19, 2012, she said she was significantly improved. She then saw Dr. Johney Frame for followup. Then I saw her in the office when she was here with her husband. They both related that she was again having exertional shortness of breath although not as bad as early December. We only talked at that time and she is now here for followup today. She is able to ambulate short distances and around work. However when she tries to coming to work in the morning oral walk longer distances in other places she has significant shortness of breath. She's not having any PND or orthopnea. She has no edema.  Allergies  Allergen Reactions  . Meperidine Hcl Nausea And Vomiting  . Morphine Other (See Comments)    vomiting  . Tetanus Toxoid Rash    "years ago"    Current Outpatient Prescriptions  Medication Sig Dispense Refill  . acetaminophen (TYLENOL) 500 MG tablet Take 1,000 mg by mouth every 6 (six) hours as needed. For pain      . albuterol (PROVENTIL HFA;VENTOLIN HFA) 108 (90 BASE) MCG/ACT inhaler Inhale 2 puffs into the lungs every 6 (six) hours as needed. For shortness of breath      . aspirin 81 MG tablet Take 81 mg by mouth daily.        Marland Kitchen atorvastatin (LIPITOR) 40 MG tablet Take 40 mg by mouth daily.      . calcium carbonate (OS-CAL) 600 MG TABS Take 600 mg by mouth daily.       . cholecalciferol (VITAMIN D) 1000 UNITS tablet Take 4,000 Units by mouth daily.      . fish oil-omega-3 fatty acids 1000 MG capsule Take 2 g by mouth 2 (two) times daily.        . furosemide (LASIX) 40 MG tablet Take 20 mg by mouth daily.      . metFORMIN (GLUCOPHAGE)  500 MG tablet Take 500 mg by mouth 2 (two) times daily with a meal.      . potassium chloride SA (K-DUR,KLOR-CON) 20 MEQ tablet Take 2 tablets (40 mEq total) by mouth daily.  180 tablet  3  . trandolapril (MAVIK) 4 MG tablet Take 8 mg by mouth daily.       Marland Kitchen venlafaxine (EFFEXOR) 37.5 MG tablet Take 37.5 mg by mouth daily.       Marland Kitchen warfarin (COUMADIN) 2.5 MG tablet Take as directed by Anticoagulation clinic  100 tablet  1  . carvedilol (COREG) 3.125 MG tablet Take 1 tablet (3.125 mg total) by mouth 2 (two) times daily.  60 tablet  6    History   Social History  . Marital Status: Married    Spouse Name: N/A    Number of Children: N/A  . 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     Social History Main Topics  . Smoking status: Never Smoker   . Smokeless tobacco: Never Used  . Alcohol Use: No  . Drug Use: No  . Sexually Active: No   Other Topics Concern  .  Not on file   Social History Narrative   Does not et regular exerciseDaily caffeine use    Family History  Problem Relation Age of Onset  . Cancer Maternal Grandfather   . Heart disease Father   . Heart disease Sister   . Diabetes Mother   . Diabetes Father   . Diabetes Sister   . Diabetes Paternal Grandfather   . Diabetes Paternal Grandmother   . Heart disease Paternal Uncle     Past Medical History  Diagnosis Date  . Atrial fibrillation     Treated with maze procedure / recurrent atrial fibrillation February, 2011... TEE cardioversion October 19, 2009  . Atrial tachycardia     February, 2011... Tikosyn started in hospital  . Bradycardia     Post cardioversion February, 2011,/digoxin/Cardizem metoprolol stopped, may need pacemaker  . Mitral regurgitation     Treated with repair, atrial appendage was removed or tied off at surgery.. this was proven by TEE February, 2011  . Pulmonary HTN     55 mmHg, echo, February, 2011 / no mention of pulmonary hypertension echo,  June, 2011  . Right ventricular dysfunction     Mild to moderate, echo, February, 2011 / normalized echo, June, 2011  . TR (tricuspid regurgitation)     Moderate, echo, February, 2011 / trivial, echo, June, 2011  . Mitral stenosis     Mild, February, 2011, post mitral valve repair / mild, echo, June, 2011  . CAD (coronary artery disease)     LIMA to the LAD at time of mitral valve repair / LIMA atretic,, February, 2011  . Ejection fraction < 50%     EF 30% echo and TEE diagnosis February, 2011, possibly rate related / no contraction by catheter, bradycardia so carvedilol cannot be used / EF 55-60% echo, June, 2011 / EF 50%, echo, February, 2012  . HTN (hypertension)   . Hemorrhoids, internal   . Diverticulosis of colon   . Colon polyp, hyperplastic   . Environmental allergies   . atrial appendage     Removed were tied off at surgery, proven by TEE February, 2011  . S/P mitral valve repair     [facility] / Maze procedure/ atrial appendage removed were tied off  . Hx of CABG     At time of mitral valve repair  . Rash     ?Vicodin, Lotensin, Toprol  . Drug therapy     Tikosyn,  stopped 02/10/2012  . QT prolongation     Tikosyn and Effexor. QT prolonged October 13, 2011, peak is in dose reduced from 500  to -250 twice a day  . H/O amiodarone therapy     Satrted 02/12/2012  . PONV (postoperative nausea and vomiting)   . Anginal pain   . Pacemaker     May 17, 2012 for  bradycardia and sick sinus syndrome  . Sick sinus syndrome     s/p Medtronic dual chamber PPM 05/17/12  . High cholesterol   . CHF (congestive heart failure) 2011    Mild in hospital February, 2011  . Asthma   . COPD (chronic obstructive pulmonary disease)   . Shortness of breath     Evaluated by Dr. Craige Cotta. Mat [id number].... bronchodilator response,,  medication started  /  patient much improved June 14, 201 2  . Type II diabetes mellitus   . Arthritis     "not bad; little in my hands; some in my knees"  (05/28/2012)  . Acquired complete  AV block     AV node ablation May 28, 2012  . Anemia     Hemoglobin 10.4, December, 2013    Past Surgical History  Procedure Date  . Atrial appendage removed   . Mitral valve repair 2001  . Maze 2001    w/ MVR & CABG  . Cardioversion 02/08/2012    Procedure: CARDIOVERSION;  Surgeon: [name], MD;  Location: [facility] OR;  Service: Cardiovascular;  Laterality: N/A;  . Cardioversion 02/29/2012    Procedure: CARDIOVERSION;  Surgeon: [name], MD;  Location: [facility] OR;  Service: Cardiovascular;  Laterality: N/A;  . Cardioversion 05/15/2012    Procedure: CARDIOVERSION;  Surgeon: [name], MD;  Location: [facility] ENDOSCOPY;  Service: Cardiovascular;  Laterality: N/A;  . Cardiac catheterization   . Tonsillectomy and adenoidectomy ~ 1950  . Cholecystectomy ?1995  . Coronary artery bypass graft 2001    CABG X1  . Breast lumpectomy 1990's    bilaterally  . Pacemaker insertion 05/17/2012    MDT Adapta L implanted by Dr Johney Frame for tachy/brady syndrome  . Abdominal hysterectomy ?2002  . Av nodal ablation 05/28/12    Patient Active Problem List  Diagnosis  . COLONIC POLYPS, HYPERPLASTIC  . DIABETES MELLITUS  . HYPERLIPIDEMIA  . HYPERTENSION  . INTERNAL HEMORRHOIDS  . DIVERTICULOSIS, COLON  . Atrial fibrillation  . Atrial tachycardia  . Bradycardia  . Mitral regurgitation  . Pulmonary HTN  . Right ventricular dysfunction  . TR (tricuspid regurgitation)  . Mitral stenosis  . CAD (coronary artery disease)  . Dizziness  . Warfarin anticoagulation  . atrial appendage  . S/P mitral valve repair  . Hx of CABG  . Rash  . Ejection fraction < 50%  . Shortness of breath  . Mild intermittent asthma  . QT prolongation  . H/O amiodarone therapy  . Drug therapy  . Allergic rhinitis  . Pacemaker-Medtronic  . Acquired complete AV block  . Sick sinus syndrome  . Anemia    ROS   Patient denies fever, chills, headache, sweats, rash, change in  vision, change in hearing, chest pain, cough, nausea vomiting, urinary symptoms. All other systems are reviewed and are negative.  PHYSICAL EXAM   Patient is stable. She is oriented to person time and place. Affect is normal. She's here with her husband. She does admit to exertional shortness of breath. This limits her only when trying to walk a longer distance such as coming in to work. There is no jugular venous distention. Lungs are clear. Respiratory effort is nonlabored. Cardiac exam reveals S1 and S2. There no clicks or significant murmurs. The abdomen is soft. There is no peripheral edema. There are no musculoskeletal deformities. There are no skin rashes.  Filed Vitals:   09/12/12 1031  BP: 140/70  Pulse: 78  Height: 5\' 3"  (1.6 m)  Weight: 179 lb (81.194 kg)   EKG is done today and reviewed by me. It is paced.  ASSESSMENT & PLAN

## 2012-09-12 NOTE — Patient Instructions (Addendum)
Your physician has recommended you make the following change in your medication: START Coreg (Carvedilol) 3.125 mg once daily for 2 days, then increase to 3.125 mg twice daily  Your physician recommends that you schedule a follow-up appointment in: February  You have an appt with Sondra Come at [facility] on Monday, 09/16/12 at 3:00 pm for a consult

## 2012-09-12 NOTE — Assessment & Plan Note (Signed)
Historically the patient had a slow heart rate and it limited her medications. She now has a pacemaker. Therefore bradycardia is no longer an issue.

## 2012-09-12 NOTE — Assessment & Plan Note (Signed)
We think that the patient has had some mild intermittent asthma. Her diffusion capacity was 63% in March, 2012. At that time there was some bronchodilator response. It is possible that pulmonary function is playing a role with her exertional shortness of breath. Chest x-ray does not show any marked abnormalities. I will consider followup pulmonary function studies at some point.

## 2012-09-12 NOTE — Assessment & Plan Note (Signed)
Her pacemaker is working well. I may ask that she have further reprogramming to control her increase in heart rate with exercise.

## 2012-09-12 NOTE — Assessment & Plan Note (Signed)
The patient did have some decrease in LV function after her AV node ablation and her pacemaker was placed. I'm trying to the appropriate medications for this. I do not know if this was rate related or related to a pacemaker. I have raised the issue of whether or not biventricular pacing might help her. This will have to be considered over time.

## 2012-09-12 NOTE — Assessment & Plan Note (Signed)
When repeated by her primary physician hemoglobin remained in the range of 10. It is felt that she needs a followup colonoscopy. This will be arranged. Her Coumadin can be held for this. She does not need to be bridged.

## 2012-09-12 NOTE — Assessment & Plan Note (Signed)
The patient does have mild functional mitral stenosis by echo. It is thought to be very mild. It is possible that this could play some role in her exertional shortness of breath. Heart rate control may be helpful with this.

## 2012-09-12 NOTE — Assessment & Plan Note (Signed)
The patient had no significant pulmonary hypertension by her echo in 2012. PA pressure in December, 2013 was 36 mm mercury. It does not seem that pulmonary hypertension is playing a major role in her exertional shortness of breath.

## 2012-09-12 NOTE — Assessment & Plan Note (Signed)
Blood pressure is controlled. No change in therapy. 

## 2012-09-12 NOTE — Telephone Encounter (Signed)
Error---appt made for 09-16-12 w/Amy Monica Becton

## 2012-09-16 ENCOUNTER — Encounter: Payer: Self-pay | Admitting: Gastroenterology

## 2012-09-16 ENCOUNTER — Ambulatory Visit: Payer: BC Managed Care – PPO | Admitting: Physician Assistant

## 2012-10-03 ENCOUNTER — Ambulatory Visit (INDEPENDENT_AMBULATORY_CARE_PROVIDER_SITE_OTHER): Payer: BC Managed Care – PPO

## 2012-10-03 ENCOUNTER — Telehealth: Payer: Self-pay

## 2012-10-03 DIAGNOSIS — I4891 Unspecified atrial fibrillation: Secondary | ICD-10-CM

## 2012-10-03 DIAGNOSIS — Z8679 Personal history of other diseases of the circulatory system: Secondary | ICD-10-CM

## 2012-10-03 DIAGNOSIS — Z7901 Long term (current) use of anticoagulants: Secondary | ICD-10-CM

## 2012-10-03 NOTE — Telephone Encounter (Signed)
Anita Duke stopped by to let Dr Myrtis Ser know that the carvedilol did not help her sob.  She states it is about the same as it was when she saw him at her last ov.

## 2012-10-09 ENCOUNTER — Other Ambulatory Visit: Payer: Self-pay

## 2012-10-09 MED ORDER — FUROSEMIDE 40 MG PO TABS: 20.0000 mg | ORAL_TABLET | Freq: Every day | ORAL | Status: DC

## 2012-10-19 ENCOUNTER — Other Ambulatory Visit: Payer: Self-pay

## 2012-10-22 ENCOUNTER — Other Ambulatory Visit: Payer: Self-pay

## 2012-10-22 MED ORDER — ATORVASTATIN CALCIUM 40 MG PO TABS: 40.0000 mg | ORAL_TABLET | Freq: Every day | ORAL | Status: DC

## 2012-10-25 ENCOUNTER — Ambulatory Visit (INDEPENDENT_AMBULATORY_CARE_PROVIDER_SITE_OTHER): Payer: BC Managed Care – PPO

## 2012-10-25 ENCOUNTER — Ambulatory Visit (INDEPENDENT_AMBULATORY_CARE_PROVIDER_SITE_OTHER): Payer: BC Managed Care – PPO | Admitting: Cardiology

## 2012-10-25 ENCOUNTER — Ambulatory Visit: Payer: BC Managed Care – PPO | Admitting: Gastroenterology

## 2012-10-25 ENCOUNTER — Encounter: Payer: Self-pay | Admitting: Cardiology

## 2012-10-25 VITALS — BP 138/82 | HR 88 | Ht 63.0 in | Wt 177.8 lb

## 2012-10-25 DIAGNOSIS — D649 Anemia, unspecified: Secondary | ICD-10-CM

## 2012-10-25 DIAGNOSIS — I471 Supraventricular tachycardia: Secondary | ICD-10-CM

## 2012-10-25 DIAGNOSIS — I498 Other specified cardiac arrhythmias: Secondary | ICD-10-CM

## 2012-10-25 DIAGNOSIS — Z7901 Long term (current) use of anticoagulants: Secondary | ICD-10-CM

## 2012-10-25 DIAGNOSIS — I4891 Unspecified atrial fibrillation: Secondary | ICD-10-CM

## 2012-10-25 DIAGNOSIS — R0602 Shortness of breath: Secondary | ICD-10-CM

## 2012-10-25 DIAGNOSIS — Z8679 Personal history of other diseases of the circulatory system: Secondary | ICD-10-CM

## 2012-10-25 MED ORDER — CARVEDILOL 3.125 MG PO TABS: 3.1250 mg | ORAL_TABLET | Freq: Two times a day (BID) | ORAL | Status: DC

## 2012-10-25 NOTE — Progress Notes (Signed)
HPI  Patient returns for the followup evaluation of shortness of breath, status post mitral valve repair with some functional mitral stenosis, atrial arrhythmias, status post AV node ablation with pacemaker, Coumadin therapy. I saw the patient last September 12, 2012. There is an extensive note about this visit. I was concerned that she was still having exertional shortness of breath. There are many possible etiologies. I decided to add a small dose of carvedilol. The rationale was to help control her heart rate to be sure that increasing heart rate with some functional mitral stenosis was not the basis of her problem. Metoprolol was not used because she had not tolerated in the past. She stop by our office after several weeks to let us know that her shortness of breath had not changed.  The patient then made a vacation trip to [place]. Within a day after her arrival in the air she clearly felt better. She was not back to her best from the past, but she was clearly significantly better. This is not related to the addition of a small dose of carvedilol. One must question whether she feels this way just being on vacation.  However I think this is very unlikely knowing her. Another possibility is some aspect of her pulmonary status. She really had a great time and was quite active. Near the end of her stay, she did begin to develop an upper respiratory infection with both coughing and nasal stuffiness. She returned home and she is receiving treatment from her primary care team including antibiotics and a short course of steroids. With the addition of these meds she began feeling much better very rapidly.  Allergies  Allergen Reactions  . Meperidine Hcl Nausea And Vomiting  . Morphine Other (See Comments)    vomiting  . Tetanus Toxoid Rash    "years ago"    Current Outpatient Prescriptions  Medication Sig Dispense Refill  . acetaminophen (TYLENOL) 500 MG tablet Take 1,000 mg by mouth every 6 (six) hours  as needed. For pain      . albuterol (PROVENTIL HFA;VENTOLIN HFA) 108 (90 BASE) MCG/ACT inhaler Inhale 2 puffs into the lungs every 6 (six) hours as needed. For shortness of breath      . aspirin 81 MG tablet Take 81 mg by mouth daily.        Marland Kitchen atorvastatin (LIPITOR) 40 MG tablet Take 1 tablet (40 mg total) by mouth daily.  30 tablet  1  . azithromycin (ZITHROMAX) 250 MG tablet Take 250 mg by mouth.       . benzonatate (TESSALON) 200 MG capsule       . calcium carbonate (OS-CAL) 600 MG TABS Take 600 mg by mouth daily.       . carvedilol (COREG) 3.125 MG tablet Take 1 tablet (3.125 mg total) by mouth 2 (two) times daily.  60 tablet  6  . cholecalciferol (VITAMIN D) 1000 UNITS tablet Take 4,000 Units by mouth daily.      . fish oil-omega-3 fatty acids 1000 MG capsule Take 2 g by mouth 2 (two) times daily.        . furosemide (LASIX) 40 MG tablet Take 0.5 tablets (20 mg total) by mouth daily.  30 tablet  3  . metFORMIN (GLUCOPHAGE) 500 MG tablet Take 500 mg by mouth 2 (two) times daily with a meal.      . potassium chloride SA (K-DUR,KLOR-CON) 20 MEQ tablet Take 2 tablets (40 mEq total) by mouth daily.  180  tablet  3  . predniSONE (STERAPRED UNI-PAK) 10 MG tablet Take by mouth.       . trandolapril (MAVIK) 4 MG tablet Take 8 mg by mouth daily.       Marland Kitchen venlafaxine (EFFEXOR) 37.5 MG tablet Take 37.5 mg by mouth daily.       Marland Kitchen warfarin (COUMADIN) 2.5 MG tablet Take as directed by Anticoagulation clinic  100 tablet  1   No current facility-administered medications for this visit.    History   Social History  . Marital Status: Married    Spouse Name: N/A    Number of Children: N/A  . 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     Social History Main Topics  . Smoking status: Never Smoker   . Smokeless tobacco: Never Used  . Alcohol Use: No  . Drug Use: No  . Sexually Active: No   Other Topics Concern  . Not on file   Social History  Narrative   Does not et regular exercise   Daily caffeine use          Family History  Problem Relation Age of Onset  . Cancer Maternal Grandfather   . Heart disease Father   . Heart disease Sister   . Diabetes Mother   . Diabetes Father   . Diabetes Sister   . Diabetes Paternal Grandfather   . Diabetes Paternal Grandmother   . Heart disease Paternal Uncle     Past Medical History  Diagnosis Date  . Atrial fibrillation     Treated with maze procedure / recurrent atrial fibrillation February, 2011... TEE cardioversion October 19, 2009  . Atrial tachycardia     February, 2011... Tikosyn started in hospital  . Bradycardia     Post cardioversion February, 2011,/digoxin/Cardizem metoprolol stopped, may need pacemaker  . Mitral regurgitation     Treated with repair, atrial appendage was removed or tied off at surgery.. this was proven by TEE February, 2011  . Pulmonary HTN     55 mmHg, echo, February, 2011 / no mention of pulmonary hypertension echo, June, 2011  . Right ventricular dysfunction     Mild to moderate, echo, February, 2011 / normalized echo, June, 2011  . TR (tricuspid regurgitation)     Moderate, echo, February, 2011 / trivial, echo, June, 2011  . Mitral stenosis     Mild, February, 2011, post mitral valve repair / mild, echo, June, 2011  . CAD (coronary artery disease)     LIMA to the LAD at time of mitral valve repair / LIMA atretic,, February, 2011  . Ejection fraction < 50%     EF 30% echo and TEE diagnosis February, 2011, possibly rate related / no contraction by catheter, bradycardia so carvedilol cannot be used / EF 55-60% echo, June, 2011 / EF 50%, echo, February, 2012  . HTN (hypertension)   . Hemorrhoids, internal   . Diverticulosis of colon   . Colon polyp, hyperplastic   . Environmental allergies   . atrial appendage     Removed were tied off at surgery, proven by TEE February, 2011  . S/P mitral valve repair     [facility] / Maze procedure/  atrial appendage removed were tied off  . Hx of CABG     At time of mitral valve repair  . Rash     ?Vicodin, Lotensin, Toprol  . Drug therapy  Tikosyn,  stopped 02/10/2012  . QT prolongation     Tikosyn and Effexor. QT prolonged October 13, 2011, peak is in dose reduced from 500  to -250 twice a day  . H/O amiodarone therapy     Satrted 02/12/2012  . PONV (postoperative nausea and vomiting)   . Anginal pain   . Pacemaker     May 17, 2012 for  bradycardia and sick sinus syndrome  . Sick sinus syndrome     s/p Medtronic dual chamber PPM 05/17/12  . High cholesterol   . CHF (congestive heart failure) 2011    Mild in hospital February, 2011  . Asthma   . COPD (chronic obstructive pulmonary disease)   . Shortness of breath     Evaluated by Dr. Craige Cotta. Mat [id number].... bronchodilator response,,  medication started  /  patient much improved June 14, 201 2  . Type II diabetes mellitus   . Arthritis     "not bad; little in my hands; some in my knees" (05/28/2012)  . Acquired complete AV block     AV node ablation May 28, 2012  . Anemia     Hemoglobin 10.4, December, 2013    Past Surgical History  Procedure Laterality Date  . Atrial appendage removed    . Mitral valve repair  2001  . Maze  2001    w/ MVR & CABG  . Cardioversion  02/08/2012    Procedure: CARDIOVERSION;  Surgeon: [name], MD;  Location: [facility] OR;  Service: Cardiovascular;  Laterality: N/A;  . Cardioversion  02/29/2012    Procedure: CARDIOVERSION;  Surgeon: [name], MD;  Location: [facility] OR;  Service: Cardiovascular;  Laterality: N/A;  . Cardioversion  05/15/2012    Procedure: CARDIOVERSION;  Surgeon: [name], MD;  Location: [facility] ENDOSCOPY;  Service: Cardiovascular;  Laterality: N/A;  . Cardiac catheterization    . Tonsillectomy and adenoidectomy  ~ 1950  . Cholecystectomy  ?1995  . Coronary artery bypass graft  2001    CABG X1  . Breast lumpectomy  1990's    bilaterally  . Pacemaker insertion   05/17/2012    MDT Adapta L implanted by Dr Johney Frame for tachy/brady syndrome  . Abdominal hysterectomy  ?2002  . Av nodal ablation  05/28/12    Patient Active Problem List  Diagnosis  . COLONIC POLYPS, HYPERPLASTIC  . DIABETES MELLITUS  . HYPERLIPIDEMIA  . HYPERTENSION  . INTERNAL HEMORRHOIDS  . DIVERTICULOSIS, COLON  . Atrial fibrillation  . Atrial tachycardia  . Bradycardia  . Mitral regurgitation  . Pulmonary HTN  . Right ventricular dysfunction  . TR (tricuspid regurgitation)  . Mitral stenosis  . CAD (coronary artery disease)  . Dizziness  . Warfarin anticoagulation  . atrial appendage  . S/P mitral valve repair  . Hx of CABG  . Rash  . Ejection fraction < 50%  . Shortness of breath  . Mild intermittent asthma  . QT prolongation  . H/O amiodarone therapy  . Drug therapy  . Allergic rhinitis  . Pacemaker-Medtronic  . Acquired complete AV block  . Sick sinus syndrome  . Anemia    ROS   Patient denies fever, chills, headache, sweats, rash, change in vision, change in hearing, chest pain, nausea vomiting, urinary symptoms. All other systems are reviewed and are negative other than the history of present illness.  PHYSICAL EXAM   The patient looks good today. She is oriented to person time and place. Affect is normal. There  is no jugular venous distention. Lungs reveal a few scattered rhonchi. There is no respiratory distress. Cardiac exam reveals S1 and S2. There no clicks or significant murmurs. The abdomen is soft. There is no peripheral edema.  Filed Vitals:   10/25/12 0901  BP: 138/82  Pulse: 88  Height: 5\' 3"  (1.6 m)  Weight: 177 lb 12.8 oz (80.65 kg)  SpO2: 95%     ASSESSMENT & PLAN

## 2012-10-25 NOTE — Assessment & Plan Note (Signed)
Atrial arrhythmias have been treated with her AV node ablation.

## 2012-10-25 NOTE — Assessment & Plan Note (Signed)
As described in the first part of this note she did have a period while in [place] of having much less shortness of breath. Etiology is not yet completely clear. I am wondering if she did not have a better pulmonary status in that setting. Multiple other potential factors will be kept in mind. For now we need to watch her as she receives the medications for her upper respiratory infection including a short course of steroids. I will see her back fairly soon to gather more information. However from the cardiac viewpoint I will not be making any changes today.

## 2012-10-25 NOTE — Assessment & Plan Note (Signed)
The patient is scheduled to have her colonoscopy soon. Her mild anemia has to be kept in mind, but it does not appear to be the basis of her exertional shortness of breath.

## 2012-10-25 NOTE — Patient Instructions (Addendum)
Your physician recommends that you schedule a follow-up appointment on: March 7  Your physician recommends that you continue on your current medications as directed. Please refer to the Current Medication list given to you today.

## 2012-10-31 ENCOUNTER — Encounter: Payer: Self-pay | Admitting: Gastroenterology

## 2012-10-31 ENCOUNTER — Telehealth: Payer: Self-pay | Admitting: *Deleted

## 2012-10-31 ENCOUNTER — Ambulatory Visit (INDEPENDENT_AMBULATORY_CARE_PROVIDER_SITE_OTHER): Payer: BC Managed Care – PPO | Admitting: Gastroenterology

## 2012-10-31 VITALS — BP 134/68 | HR 76 | Ht 62.0 in | Wt 179.4 lb

## 2012-10-31 DIAGNOSIS — Z1211 Encounter for screening for malignant neoplasm of colon: Secondary | ICD-10-CM

## 2012-10-31 NOTE — Telephone Encounter (Signed)
[facility] 10/31/2012    RE: MILLIANA REDDOCH DOB: 06/23/1941 MRN: [id number]   Dear Dr Myrtis Ser,    We have scheduled the above patient for an endoscopic procedure. Our records show that she is on anticoagulation therapy.   Please advise as to how long the patient may come off her therapy of Coumadin prior to the procedure, which is scheduled for 11/14/2012.  Please fax back/ or route the completed form to Nirvi Boehler at [phone].   Sincerely,  Merri Ray

## 2012-10-31 NOTE — Progress Notes (Signed)
History of Present Illness: Pleasant 72 year old white female with history of atrial arrhythmias, on Coumadin, here for colorectal cancer screening. Last colonoscopy 10 years ago demonstrated a hyperplastic polyp. She has no GI complaints including change of bowel habits, abdominal pain, melena or hematochezia. She has no cardiac complaints including shortness of breath or chest pain.    Past Medical History  Diagnosis Date  . Atrial fibrillation     Treated with maze procedure / recurrent atrial fibrillation February, 2011... TEE cardioversion October 19, 2009  . Atrial tachycardia     February, 2011... Tikosyn started in hospital  . Bradycardia     Post cardioversion February, 2011,/digoxin/Cardizem metoprolol stopped, may need pacemaker  . Mitral regurgitation     Treated with repair, atrial appendage was removed or tied off at surgery.. this was proven by TEE February, 2011  . Pulmonary HTN     55 mmHg, echo, February, 2011 / no mention of pulmonary hypertension echo, June, 2011  . Right ventricular dysfunction     Mild to moderate, echo, February, 2011 / normalized echo, June, 2011  . TR (tricuspid regurgitation)     Moderate, echo, February, 2011 / trivial, echo, June, 2011  . Mitral stenosis     Mild, February, 2011, post mitral valve repair / mild, echo, June, 2011  . CAD (coronary artery disease)     LIMA to the LAD at time of mitral valve repair / LIMA atretic,, February, 2011  . Ejection fraction < 50%     EF 30% echo and TEE diagnosis February, 2011, possibly rate related / no contraction by catheter, bradycardia so carvedilol cannot be used / EF 55-60% echo, June, 2011 / EF 50%, echo, February, 2012  . HTN (hypertension)   . Hemorrhoids, internal   . Diverticulosis of colon   . Colon polyp, hyperplastic   . Environmental allergies   . atrial appendage     Removed were tied off at surgery, proven by TEE February, 2011  . S/P mitral valve repair     [facility] / Maze  procedure/ atrial appendage removed were tied off  . Hx of CABG     At time of mitral valve repair  . Rash     ?Vicodin, Lotensin, Toprol  . Drug therapy     Tikosyn,  stopped 02/10/2012  . QT prolongation     Tikosyn and Effexor. QT prolonged October 13, 2011, peak is in dose reduced from 500  to -250 twice a day  . H/O amiodarone therapy     Satrted 02/12/2012  . PONV (postoperative nausea and vomiting)   . Anginal pain   . Pacemaker     May 17, 2012 for  bradycardia and sick sinus syndrome  . Sick sinus syndrome     s/p Medtronic dual chamber PPM 05/17/12  . High cholesterol   . CHF (congestive heart failure) 2011    Mild in hospital February, 2011  . Asthma   . COPD (chronic obstructive pulmonary disease)   . Shortness of breath     Evaluated by Dr. Craige Cotta. Mat [id number].... bronchodilator response,,  medication started  /  patient much improved June 14, 201 2  . Type II diabetes mellitus   . Arthritis     "not bad; little in my hands; some in my knees" (05/28/2012)  . Acquired complete AV block     AV node ablation May 28, 2012  . Anemia     Hemoglobin 10.4, December, 2013  Past Surgical History  Procedure Laterality Date  . Atrial appendage removed    . Mitral valve repair  2001  . Maze  2001    w/ MVR & CABG  . Cardioversion  02/08/2012    Procedure: CARDIOVERSION;  Surgeon: [name], MD;  Location: [facility] OR;  Service: Cardiovascular;  Laterality: N/A;  . Cardioversion  02/29/2012    Procedure: CARDIOVERSION;  Surgeon: [name], MD;  Location: [facility] OR;  Service: Cardiovascular;  Laterality: N/A;  . Cardioversion  05/15/2012    Procedure: CARDIOVERSION;  Surgeon: [name], MD;  Location: [facility] ENDOSCOPY;  Service: Cardiovascular;  Laterality: N/A;  . Cardiac catheterization    . Tonsillectomy and adenoidectomy  ~ 1950  . Cholecystectomy  ?1995  . Coronary artery bypass graft  2001    CABG X1  . Breast lumpectomy  1990's    bilaterally  . Pacemaker  insertion  05/17/2012    MDT Adapta L implanted by Dr Johney Frame for tachy/brady syndrome  . Abdominal hysterectomy  ?2002  . Av nodal ablation  05/28/12   family history includes Diabetes in her father, mother, paternal grandfather, paternal grandmother, and sister; Heart disease in her father, paternal uncle, and sister; and Pancreatic cancer in her maternal grandfather. Current Outpatient Prescriptions  Medication Sig Dispense Refill  . acetaminophen (TYLENOL) 500 MG tablet Take 1,000 mg by mouth every 6 (six) hours as needed. For pain      . albuterol (PROVENTIL HFA;VENTOLIN HFA) 108 (90 BASE) MCG/ACT inhaler Inhale 2 puffs into the lungs every 6 (six) hours as needed. For shortness of breath      . aspirin 81 MG tablet Take 81 mg by mouth daily.        Marland Kitchen atorvastatin (LIPITOR) 40 MG tablet Take 1 tablet (40 mg total) by mouth daily.  30 tablet  1  . calcium carbonate (OS-CAL) 600 MG TABS Take 600 mg by mouth daily.       . carvedilol (COREG) 3.125 MG tablet Take 1 tablet (3.125 mg total) by mouth 2 (two) times daily with a meal.  180 tablet  3  . cholecalciferol (VITAMIN D) 1000 UNITS tablet Take 4,000 Units by mouth daily.      . fish oil-omega-3 fatty acids 1000 MG capsule Take 2 g by mouth 2 (two) times daily.        . furosemide (LASIX) 40 MG tablet Take 0.5 tablets (20 mg total) by mouth daily.  30 tablet  3  . metFORMIN (GLUCOPHAGE) 500 MG tablet Take 500 mg by mouth 2 (two) times daily with a meal.      . potassium chloride SA (K-DUR,KLOR-CON) 20 MEQ tablet Take 2 tablets (40 mEq total) by mouth daily.  180 tablet  3  . trandolapril (MAVIK) 4 MG tablet Take 8 mg by mouth daily.       Marland Kitchen venlafaxine (EFFEXOR) 37.5 MG tablet Take 37.5 mg by mouth daily.       Marland Kitchen warfarin (COUMADIN) 2.5 MG tablet Take as directed by Anticoagulation clinic  100 tablet  1   No current facility-administered medications for this visit.   Allergies as of 10/31/2012 - Review Complete 10/31/2012  Allergen  Reaction Noted  . Demerol (meperidine) Nausea And Vomiting 10/31/2012  . Morphine Other (See Comments)   . Tetanus toxoid Rash     reports that she has never smoked. She has never used smokeless tobacco. She reports that she does not drink alcohol or use illicit  drugs.     Review of Systems: Pertinent positive and negative review of systems were noted in the above HPI section. All other review of systems were otherwise negative.  Vital signs were reviewed in today's medical record Physical Exam: General: Well developed , well nourished, no acute distress Skin: anicteric Head: Normocephalic and atraumatic Eyes:  sclerae anicteric, EOMI Ears: Normal auditory acuity Mouth: No deformity or lesions Neck: Supple, no masses or thyromegaly Lungs: Clear throughout to auscultation Heart: Regular rate and rhythm; no murmurs, rubs or bruits Abdomen: Soft, non tender and non distended. No masses, hepatosplenomegaly or hernias noted. Normal Bowel sounds Rectal:deferred Musculoskeletal: Symmetrical with no gross deformities  Skin: No lesions on visible extremities Pulses:  Normal pulses noted Extremities: No clubbing, cyanosis, edema or deformities noted Neurological: Alert oriented x 4, grossly nonfocal Cervical Nodes:  No significant cervical adenopathy Inguinal Nodes: No significant inguinal adenopathy Psychological:  Alert and cooperative. Normal mood and affect

## 2012-10-31 NOTE — Patient Instructions (Addendum)
You have been scheduled for a colonoscopy with propofol. Please follow written instructions given to you at your visit today.  Please pick up your prep kit at the pharmacy within the next 1-3 days. If you use inhalers (even only as needed) or a CPAP machine, please bring them with you on the day of your procedure.  You will be contaced by our office prior to your procedure for directions on holding your Coumadin/Warfarin.  If you do not hear from our office 1 week prior to your scheduled procedure, please call [phone] to discuss. 

## 2012-10-31 NOTE — Assessment & Plan Note (Signed)
Patient will be scheduled for screening colonoscopy. We'll check with her cardiologist whether we can hold Coumadin.

## 2012-11-01 NOTE — Telephone Encounter (Signed)
Coumadin can be held for 7 days before GI procedure. Restart after procedure.

## 2012-11-04 NOTE — Telephone Encounter (Signed)
L/M for patient per Dr Myrtis Ser ok for her to come off coumadin 5 days before

## 2012-11-08 ENCOUNTER — Ambulatory Visit: Payer: BC Managed Care – PPO | Admitting: Cardiology

## 2012-11-08 ENCOUNTER — Encounter: Payer: Self-pay | Admitting: Cardiology

## 2012-11-08 NOTE — Progress Notes (Signed)
   The patient was scheduled to be seen in the office today. The office scheduled was canceled because of inclement weather. I called the patient to see how she is doing.  When I saw her in the office last she had related that she had felt much better in general during her trip to [place]. She then developed an upper respiratory infection that was being treated with antibiotics and steroids when I saw her back in the office last. The plan was to watch her and to see how she felt once her upper respiratory symptoms were resolved. She tells me that she has still had some persistent upper respiratory symptoms. It does not sound like congestive heart failure. She has had a return visit with her primary care team. Labs were obtained and she tells me that her hemoglobin is a little bit lower. She is stopping her Coumadin today with plans to have her colonoscopy next Thursday. Her primary care team had considered another antibiotic but decided not to use it based on her lab results. I do not have any further specifics concerning this issue.  From the cardiac viewpoint I believe that she is stable. It is important to proceed with her colonoscopy. I will be in touch with her and her primary care team next week to see if we need further help from the pulmonary team.  Jerral Bonito, MD

## 2012-11-14 ENCOUNTER — Encounter: Payer: Self-pay | Admitting: Gastroenterology

## 2012-11-14 ENCOUNTER — Other Ambulatory Visit: Payer: Self-pay | Admitting: Gastroenterology

## 2012-11-14 ENCOUNTER — Ambulatory Visit (AMBULATORY_SURGERY_CENTER): Payer: BC Managed Care – PPO | Admitting: Gastroenterology

## 2012-11-14 VITALS — BP 124/59 | HR 72 | Temp 97.8°F | Resp 15 | Ht 62.0 in | Wt 179.0 lb

## 2012-11-14 DIAGNOSIS — Z1211 Encounter for screening for malignant neoplasm of colon: Secondary | ICD-10-CM

## 2012-11-14 DIAGNOSIS — K639 Disease of intestine, unspecified: Secondary | ICD-10-CM

## 2012-11-14 DIAGNOSIS — D126 Benign neoplasm of colon, unspecified: Secondary | ICD-10-CM

## 2012-11-14 DIAGNOSIS — K6389 Other specified diseases of intestine: Secondary | ICD-10-CM

## 2012-11-14 DIAGNOSIS — C9000 Multiple myeloma not having achieved remission: Secondary | ICD-10-CM

## 2012-11-14 LAB — GLUCOSE, CAPILLARY: Glucose-Capillary: 119 mg/dL — ABNORMAL HIGH (ref 70–99)

## 2012-11-14 MED ORDER — SODIUM CHLORIDE 0.9 % IV SOLN: 500.0000 mL | INTRAVENOUS | Status: DC

## 2012-11-14 NOTE — Progress Notes (Signed)
Patient did not experience any of the following events: a burn prior to discharge; a fall within the facility; wrong site/side/patient/procedure/implant event; or a hospital transfer or hospital admission upon discharge from the facility. (G8907) Patient did not have preoperative order for IV antibiotic SSI prophylaxis. (G8918)  

## 2012-11-14 NOTE — Op Note (Addendum)
[facility] [address] [place], [address]   COLONOSCOPY PROCEDURE REPORT  PATIENT: Anita, Duke  MR#: [id number] BIRTHDATE: October 14, 1940 , 71  yrs. old GENDER: Female ENDOSCOPIST: Louis Meckel, MD REFERRED [id number] Oneta Rack, M.D.  [name], M.D. PROCEDURE DATE:  11/14/2012 PROCEDURE:   Colonoscopy with biopsy, Colonoscopy with snare polypectomy, and Submucosal injection, any substance ASA CLASS:   Class II INDICATIONS:average risk screening. MEDICATIONS: MAC sedation, administered by CRNA and propofol (Diprivan) 300mg  IV  DESCRIPTION OF PROCEDURE:   After the risks benefits and alternatives of the procedure were thoroughly explained, informed consent was obtained.  A digital rectal exam revealed no abnormalities of the rectum.   The LB CF-[id number]  endoscope was introduced through the anus and advanced to the cecum, which was identified by the ileocecal valve. No adverse events experienced.   The quality of the prep was Suprep good  The instrument was then slowly withdrawn as the colon was fully examined.      COLON FINDINGS: External hemorrhoids are present in In the cecum there was a sessile polyp measuring at least 2 cm with thickening in the central portion of the polyp.  Mucosa was irregular. Multiple biopsies were taken.  In the ascending colon there was a 15 mm sessile polyp.  In the mid transverse colon there was a 10-15 mm sessile polyp. This was removed with hot polypectomy snare and submitted to pathology.  The surrounding area was tattooed.  A single endoscopic clip was placed across the filling defect to reduce risk of bleeding (patient will be placed back on Coumadin).   The colon mucosa was otherwise normal.  Retroflexed views revealed no abnormalities. The time to cecum=6 minutes 47 seconds.  Withdrawal time=13 minutes 34 seconds.  The scope was withdrawn and the procedure completed. COMPLICATIONS: There were no  complications.  ENDOSCOPIC IMPRESSION: 1.   In the cecum there was a sessile polyp measuring at least 2 cm with thickening in the central portion of the polyp.  Mucosa was irregular.  Multiple biopsies were taken.  In the ascending colon there was a 15 mm sessile polyp.  In the mid transverse colon there was a 10-15 mm sessile polyp. This was removed with hot polypectomy snare and submitted to pathology.  The surrounding area was tattooed.  A single endoscopic clip was placed across the filling defect to reduce risk of bleeding (patient will be placed back on Coumadin) 2.   The colon mucosa was otherwise normal  RECOMMENDATIONS: Await biopsy results followup colonoscopy one year Resume Coumadin in a.m.   eSigned:  Louis Meckel, MD 11/14/2012 10:20 AM Revised: 11/14/2012 10:20 AM  cc:   PATIENT NAME:  Anita, Duke MR#: [id number]

## 2012-11-14 NOTE — Progress Notes (Signed)
Report to pacu rn, vss, bbs=clear 

## 2012-11-14 NOTE — Patient Instructions (Addendum)
Resume Coumadin in a.m.  YOU HAD AN ENDOSCOPIC PROCEDURE TODAY AT THE [facility]: Refer to the procedure report that was given to you for any specific questions about what was found during the examination.  If the procedure report does not answer your questions, please call your gastroenterologist to clarify.  If you requested that your care partner not be given the details of your procedure findings, then the procedure report has been included in a sealed envelope for you to review at your convenience later.  YOU SHOULD EXPECT: Some feelings of bloating in the abdomen. Passage of more gas than usual.  Walking can help get rid of the air that was put into your GI tract during the procedure and reduce the bloating. If you had a lower endoscopy (such as a colonoscopy or flexible sigmoidoscopy) you may notice spotting of blood in your stool or on the toilet paper. If you underwent a bowel prep for your procedure, then you may not have a normal bowel movement for a few days.  DIET: Your first meal following the procedure should be a light meal and then it is ok to progress to your normal diet.  A half-sandwich or bowl of soup is an example of a good first meal.  Heavy or fried foods are harder to digest and may make you feel nauseous or bloated.  Likewise meals heavy in dairy and vegetables can cause extra gas to form and this can also increase the bloating.  Drink plenty of fluids but you should avoid alcoholic beverages for 24 hours.  ACTIVITY: Your care partner should take you home directly after the procedure.  You should plan to take it easy, moving slowly for the rest of the day.  You can resume normal activity the day after the procedure however you should NOT DRIVE or use heavy machinery for 24 hours (because of the sedation medicines used during the test).    SYMPTOMS TO REPORT IMMEDIATELY: A gastroenterologist can be reached at any hour.  During normal business hours, 8:30 AM to 5:00  PM Monday through Friday, call [phone].  After hours and on weekends, please call the GI answering service at [phone] who will take a message and have the physician on call contact you.   Following lower endoscopy (colonoscopy or flexible sigmoidoscopy):  Excessive amounts of blood in the stool  Significant tenderness or worsening of abdominal pains  Swelling of the abdomen that is new, acute  Fever of 100F or higher  FOLLOW UP: If any biopsies were taken you will be contacted by phone or by letter within the next 1-3 weeks.  Call your gastroenterologist if you have not heard about the biopsies in 3 weeks.  Our staff will call the home number listed on your records the next business day following your procedure to check on you and address any questions or concerns that you may have at that time regarding the information given to you following your procedure. This is a courtesy call and so if there is no answer at the home number and we have not heard from you through the emergency physician on call, we will assume that you have returned to your regular daily activities without incident.  SIGNATURES/CONFIDENTIALITY: You and/or your care partner have signed paperwork which will be entered into your electronic medical record.  These signatures attest to the fact that that the information above on your After Visit Summary has been reviewed and is understood.  Full responsibility  of the confidentiality of this discharge information lies with you and/or your care-partner.  Polyps-handout given  Wait biopsy results  Resume coumadin tomorrow 11/15/12   Clip card given

## 2012-11-14 NOTE — Progress Notes (Signed)
Heart Rhythm is regular rhythm, bundle branch block note sinus or RR charted.

## 2012-11-15 ENCOUNTER — Telehealth: Payer: Self-pay | Admitting: *Deleted

## 2012-11-15 ENCOUNTER — Ambulatory Visit (INDEPENDENT_AMBULATORY_CARE_PROVIDER_SITE_OTHER): Payer: BC Managed Care – PPO | Admitting: Adult Health

## 2012-11-15 ENCOUNTER — Telehealth: Payer: Self-pay

## 2012-11-15 ENCOUNTER — Encounter: Payer: Self-pay | Admitting: Adult Health

## 2012-11-15 VITALS — BP 142/84 | HR 77 | Temp 97.8°F | Ht 63.0 in | Wt 178.0 lb

## 2012-11-15 DIAGNOSIS — R0602 Shortness of breath: Secondary | ICD-10-CM

## 2012-11-15 NOTE — Telephone Encounter (Signed)
Received call from [facility]. Per Dr. 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 Dr. Arlyce Dice aware.

## 2012-11-15 NOTE — Telephone Encounter (Signed)
No answer, left message to call if questions or concerns. 

## 2012-11-15 NOTE — Patient Instructions (Addendum)
Begin Symbicort 80/4.50mcg 2 puffs Twice daily  , brush/rinse/gargle after use.  Take Claritin 10mg  daily for drainage/throat clearing  Delsym 2 tsp Twice daily  As needed  Cough  Discuss with Dr. Myrtis Ser regarding [facility] that may be causing your cough to be worse.  follow up Dr. Craige Cotta  In 4 weeks and As needed

## 2012-11-15 NOTE — Progress Notes (Signed)
History of Present Illness: Anita Duke is a 72 y.o. female never smoker with known hx of allergic asthma, diastolic dysfunction, and obesity/deconditioning. Hx of mitral valve repair with some functional mitral stenosis, atrial arrhythmias, status post AV node ablation with pacemaker 05/2012  11/15/12 Acute OV  Complains over last 6 months she has had increased DOE. Complains that after her pacemaker she in 05/2012 has had DOE with minimal activity . Her activity tolerance had decreased due to dyspnea.  She has seen cards with echo done 08/2012 w/ EF of 40-45%, mild LVH, LA severely dilated, mild RA dilatation. PAP 36 .  She has no chest pain, increased edema, n/v, dypshagia , fever.  Does have a daily dry cough , and tickle in throat .  Some post nasal drainage.  No longer taking her QVAR as previously recommended  Previous PFT no obstruction or reversibility  Mid flows with some reversibility.  CXR 08/2012 with NAD    Physical Exam: General - no distress  HEENT - PERRLA, EOMI, no sinus tenderness, clear sinus drainage, no oral exudate, no LAN, no thyromegaly  Cardiac - s1s2 with 2/6 systolic murmur, pulses symmetric repeat pulse check by me was 64  Chest - no wheeze/rales/dullness  Abd - soft, nontender, normal bowel sounds  Ext - no e/c/c  Neuro - normal strength,  A&O x 3  Psych - normal mood/behavior  Skin - no rashes

## 2012-11-17 ENCOUNTER — Encounter: Payer: Self-pay | Admitting: Adult Health

## 2012-11-17 NOTE — Assessment & Plan Note (Addendum)
DOE x 6 months (05/2012) ? Etiology suspect is multifactoral in nature  May have Asthma flare off QVAR  Pt is on ACE that may be causing her daily cough, doubt it is contributing to her dyspnea but may be good idea to change to ARB as she has asthma w/ potential for flares.   Plan  Check cxr  Begin Symbicort 80/4.43mcg 2 puffs Twice daily  , brush/rinse/gargle after use.  Take Claritin 10mg  daily for drainage/throat clearing  Delsym 2 tsp Twice daily  As needed  Cough  Discuss with Dr. Myrtis Ser regarding [facility] that may be causing your cough to be worse.  follow up Dr. Craige Cotta  In 4 weeks and As needed

## 2012-11-18 ENCOUNTER — Telehealth: Payer: Self-pay | Admitting: Gastroenterology

## 2012-11-18 ENCOUNTER — Telehealth (INDEPENDENT_AMBULATORY_CARE_PROVIDER_SITE_OTHER): Payer: Self-pay

## 2012-11-18 ENCOUNTER — Other Ambulatory Visit: Payer: Self-pay | Admitting: Gastroenterology

## 2012-11-18 ENCOUNTER — Telehealth: Payer: Self-pay

## 2012-11-18 ENCOUNTER — Other Ambulatory Visit (INDEPENDENT_AMBULATORY_CARE_PROVIDER_SITE_OTHER): Payer: BC Managed Care – PPO

## 2012-11-18 DIAGNOSIS — K6389 Other specified diseases of intestine: Secondary | ICD-10-CM

## 2012-11-18 LAB — CBC WITH DIFFERENTIAL/PLATELET
Basophils Absolute: 0.1 10*3/uL (ref 0.0–0.1)
Eosinophils Absolute: 0.1 10*3/uL (ref 0.0–0.7)
Lymphocytes Relative: 30.6 % (ref 12.0–46.0)
MCHC: 31.4 g/dL (ref 30.0–36.0)
Neutrophils Relative %: 55.9 % (ref 43.0–77.0)
RBC: 4.72 Mil/uL (ref 3.87–5.11)
RDW: 19.7 % — ABNORMAL HIGH (ref 11.5–14.6)

## 2012-11-18 LAB — COMPREHENSIVE METABOLIC PANEL
ALT: 19 U/L (ref 0–35)
AST: 22 U/L (ref 0–37)
Albumin: 4 g/dL (ref 3.5–5.2)
Calcium: 9.2 mg/dL (ref 8.4–10.5)
Chloride: 103 mEq/L (ref 96–112)
Potassium: 3.8 mEq/L (ref 3.5–5.1)
Sodium: 138 mEq/L (ref 135–145)

## 2012-11-18 NOTE — Telephone Encounter (Signed)
Message copied by Chrystie Nose on Mon Nov 18, 2012 10:38 AM ------      Message from: Melvia Heaps D      Created: Mon Nov 18, 2012  8:52 AM       Please schedule a CT of the chest abdomen and pelvis. Patient needs a CBC, comprehensive metabolic profile and CEA. She also needs an appointment with Dr. Donell Beers for surgical resection. ------

## 2012-11-18 NOTE — Telephone Encounter (Signed)
I called pt and gave her a new appt for 3/19 @ 3:00 to see Dr Donell Beers.

## 2012-11-18 NOTE — Telephone Encounter (Signed)
Left message for Rose with [facility] to call back regarding scan.

## 2012-11-18 NOTE — Telephone Encounter (Signed)
Pt to come get labs. Pt scheduled for CT of CAP at [facility] 11/21/12 at 2pm. Pt to be NPO after 10am. Pt to drink 1st bottle of contrast at 12noon and the second bottle at 1pm. Pt needs to hold her metformin the day of the CT scan. She will receive further instructions at [facility].

## 2012-11-18 NOTE — Telephone Encounter (Signed)
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 I explained that she will require a surgical resection and that the office will arrange for surgical consultation and for a staging CT scan.

## 2012-11-18 NOTE — Telephone Encounter (Signed)
Pt scheduled to see Dr. Donell Beers with [facility] 11/25/12. Pt to arrive at the office at 9:15am for a 9:45am appt.  Spoke with pt and her [facility] appt was moved to 11/20/12 2:30pm for a 3pm appt. Moved pts CT scan of CAP to 11/19/12. Pt to be NPO after 10am and hold metformin. Pt to drink 1st bottle of contrast at 12noon and 2nd bottle at 1pm. CT appt at 2pm. Pt aware of appt dates and times.

## 2012-11-18 NOTE — Telephone Encounter (Signed)
Dr. Arlyce Dice does the pt know she will be getting a call regarding the CT and surgical appt?

## 2012-11-19 ENCOUNTER — Ambulatory Visit (INDEPENDENT_AMBULATORY_CARE_PROVIDER_SITE_OTHER)
Admission: RE | Admit: 2012-11-19 | Discharge: 2012-11-19 | Disposition: A | Discharge status: Home / Self Care | Payer: BC Managed Care – PPO | Source: Ambulatory Visit | Attending: Gastroenterology | Admitting: Gastroenterology

## 2012-11-19 DIAGNOSIS — K6389 Other specified diseases of intestine: Secondary | ICD-10-CM

## 2012-11-19 MED ORDER — IOHEXOL 300 MG/ML  SOLN
100.0000 mL | Freq: Once | INTRAMUSCULAR | Status: AC | PRN
  Administered 2012-11-19: 100 mL via INTRAVENOUS

## 2012-11-19 NOTE — Progress Notes (Signed)
Reviewed and agree with assessment/plan. 

## 2012-11-19 NOTE — Telephone Encounter (Signed)
yes

## 2012-11-20 ENCOUNTER — Encounter (INDEPENDENT_AMBULATORY_CARE_PROVIDER_SITE_OTHER): Payer: Self-pay | Admitting: General Surgery

## 2012-11-20 ENCOUNTER — Ambulatory Visit (INDEPENDENT_AMBULATORY_CARE_PROVIDER_SITE_OTHER): Payer: BC Managed Care – PPO | Admitting: General Surgery

## 2012-11-20 ENCOUNTER — Encounter (INDEPENDENT_AMBULATORY_CARE_PROVIDER_SITE_OTHER): Payer: Self-pay

## 2012-11-20 VITALS — BP 132/80 | HR 84 | Temp 97.1°F | Resp 16 | Ht 61.0 in | Wt 177.0 lb

## 2012-11-20 DIAGNOSIS — C180 Malignant neoplasm of cecum: Secondary | ICD-10-CM | POA: Insufficient documentation

## 2012-11-20 NOTE — Patient Instructions (Signed)
We will work on getting cardiac clearance from Dr. Myrtis Ser.    You will need to stop coumadin 5 days before surgery.  We will do a lite bowel prep before surgery.  We will schedule surgery once we hear from Dr. Myrtis Ser.

## 2012-11-20 NOTE — Assessment & Plan Note (Signed)
Patient with new diagnosis of cecal cancer. There is no evidence of metastatic disease. We are still awaiting her CEA level. She will need a right hemicolectomy. She is on Coumadin and has a history of valvular disease, pulmonary hypertension, and right heart diastolic dysfunction. She sees Dr. Myrtis Ser for this. We will need cardiac clearance and permission to stop her Coumadin.  I reviewed the risks of surgery including bleeding, infection, damage to adjacent structures, and anastomotic leak, hernia,blood clot, and cardiac complications.  There is a risk of finding metastatic disease or nodal disease which would require chemotherapy.    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

## 2012-11-20 NOTE — Progress Notes (Signed)
Chief Complaint  Patient presents with  . New Evaluation    eval rt col ca / possible resection    HISTORY: Patient is a 71-year-old female with a new diagnosis of cecal cancer. She had a screening colonoscopy and was found to have a polyp. This was felt to appear benign, but final pathology demonstrated adenocarcinoma. She did not have any rectal bleeding. She denies any weight loss.  She has not had constipation or diarrhea.  She does not have any abdominal pain.  She has no family history of colon cancer.  She does have shortness of breath, and is seeing Dr. Katz for cardiac disease.    Past Medical History  Diagnosis Date  . Atrial fibrillation     Treated with maze procedure / recurrent atrial fibrillation February, 2011... TEE cardioversion October 19, 2009  . Atrial tachycardia     February, 2011... Tikosyn started in hospital  . Bradycardia     Post cardioversion February, 2011,/digoxin/Cardizem metoprolol stopped, may need pacemaker  . Mitral regurgitation     Treated with repair, atrial appendage was removed or tied off at surgery.. this was proven by TEE February, 2011  . Pulmonary HTN     55 mmHg, echo, February, 2011 / no mention of pulmonary hypertension echo, June, 2011  . Right ventricular dysfunction     Mild to moderate, echo, February, 2011 / normalized echo, June, 2011  . TR (tricuspid regurgitation)     Moderate, echo, February, 2011 / trivial, echo, June, 2011  . Mitral stenosis     Mild, February, 2011, post mitral valve repair / mild, echo, June, 2011  . CAD (coronary artery disease)     LIMA to the LAD at time of mitral valve repair / LIMA atretic,, February, 2011  . Ejection fraction < 50%     EF 30% echo and TEE diagnosis February, 2011, possibly rate related / no contraction by catheter, bradycardia so carvedilol cannot be used / EF 55-60% echo, June, 2011 / EF 50%, echo, February, 2012  . HTN (hypertension)   . Hemorrhoids, internal   . Diverticulosis of  colon   . Colon polyp, hyperplastic   . Environmental allergies   . atrial appendage     Removed were tied off at surgery, proven by TEE February, 2011  . S/P mitral valve repair     [facility] / Maze procedure/ atrial appendage removed were tied off  . Hx of CABG     At time of mitral valve repair  . Rash     ?Vicodin, Lotensin, Toprol  . Drug therapy     Tikosyn,  stopped 02/10/2012  . QT prolongation     Tikosyn and Effexor. QT prolonged October 13, 2011, peak is in dose reduced from 500  to -250 twice a day  . H/O amiodarone therapy     Satrted 02/12/2012  . PONV (postoperative nausea and vomiting)   . Anginal pain   . Pacemaker     May 17, 2012 for  bradycardia and sick sinus syndrome  . Sick sinus syndrome     s/p Medtronic dual chamber PPM 05/17/12  . High cholesterol   . CHF (congestive heart failure) 2011    Mild in hospital February, 2011  . Asthma   . COPD (chronic obstructive pulmonary disease)   . Shortness of breath     Evaluated by Dr. Sood. Mat 14,2012.... bronchodilator response,,  medication started  /  patient much improved June 14, 201   2  . Type II diabetes mellitus   . Arthritis     "not bad; little in my hands; some in my knees" (05/28/2012)  . Acquired complete AV block     AV node ablation May 28, 2012  . Anemia     Hemoglobin 10.4, December, 2013    Past Surgical History  Procedure Laterality Date  . Atrial appendage removed    . Mitral valve repair  2001  . Maze  2001    w/ MVR & CABG  . Cardioversion  02/08/2012    Procedure: CARDIOVERSION;  Surgeon: Jeffrey D Katz, MD;  Location: [facility] OR;  Service: Cardiovascular;  Laterality: N/A;  . Cardioversion  02/29/2012    Procedure: CARDIOVERSION;  Surgeon: Jeffrey D Katz, MD;  Location: [facility] OR;  Service: Cardiovascular;  Laterality: N/A;  . Cardioversion  05/15/2012    Procedure: CARDIOVERSION;  Surgeon: Jeffrey D Katz, MD;  Location: [facility] ENDOSCOPY;  Service: Cardiovascular;  Laterality: N/A;  .  Cardiac catheterization    . Tonsillectomy and adenoidectomy  ~ 1950  . Cholecystectomy  ?1995  . Coronary artery bypass graft  2001    CABG X1  . Breast lumpectomy  1990's    bilaterally  . Pacemaker insertion  05/17/2012    MDT Adapta L implanted by Dr Allred for tachy/brady syndrome  . Abdominal hysterectomy  ?2002  . Av nodal ablation  05/28/12    Current Outpatient Prescriptions  Medication Sig Dispense Refill  . acetaminophen (TYLENOL) 500 MG tablet Take 1,000 mg by mouth every 6 (six) hours as needed. For pain      . albuterol (PROVENTIL HFA;VENTOLIN HFA) 108 (90 BASE) MCG/ACT inhaler Inhale 2 puffs into the lungs every 6 (six) hours as needed. For shortness of breath      . aspirin 81 MG tablet Take 81 mg by mouth daily.        . atorvastatin (LIPITOR) 40 MG tablet Take 1 tablet (40 mg total) by mouth daily.  30 tablet  1  . calcium carbonate (OS-CAL) 600 MG TABS Take 600 mg by mouth daily.       . carvedilol (COREG) 3.125 MG tablet Take 1 tablet (3.125 mg total) by mouth 2 (two) times daily with a meal.  180 tablet  3  . cholecalciferol (VITAMIN D) 1000 UNITS tablet Take 4,000 Units by mouth daily.      . fish oil-omega-3 fatty acids 1000 MG capsule Take 2 g by mouth 2 (two) times daily.        . furosemide (LASIX) 40 MG tablet Take 0.5 tablets (20 mg total) by mouth daily.  30 tablet  3  . loratadine (CLARITIN) 10 MG tablet Take 10 mg by mouth daily.      . metFORMIN (GLUCOPHAGE) 500 MG tablet Take 500 mg by mouth 2 (two) times daily with a meal.      . montelukast (SINGULAIR) 4 MG PACK Take 4 mg by mouth at bedtime.      . potassium chloride SA (K-DUR,KLOR-CON) 20 MEQ tablet Take 2 tablets (40 mEq total) by mouth daily.  180 tablet  3  . trandolapril (MAVIK) 4 MG tablet Take 8 mg by mouth daily.       . venlafaxine (EFFEXOR) 37.5 MG tablet Take 37.5 mg by mouth daily.       . warfarin (COUMADIN) 2.5 MG tablet Take as directed by Anticoagulation clinic  100 tablet  1   No  current facility-administered medications for this   visit.     Allergies  Allergen Reactions  . Demerol (Meperidine) Nausea And Vomiting  . Morphine Other (See Comments)    vomiting  . Tetanus Toxoid Rash    "years ago"     Family History  Problem Relation Age of Onset  . Pancreatic cancer Maternal Grandfather   . Heart disease Father   . Diabetes Father   . Heart disease Sister   . Diabetes Mother   . Kidney disease Mother   . Diabetes Sister   . Diabetes Paternal Grandfather   . Diabetes Paternal Grandmother   . Heart disease Paternal Uncle      History   Social History  . Marital Status: Married    Spouse Name: N/A    Number of Children: 0  . Years of Education: N/A   Occupational History  . [employer]     assit manager  .    .     Social History Main Topics  . Smoking status: Never Smoker   . Smokeless tobacco: Never Used  . Alcohol Use: No  . Drug Use: No  . Sexually Active: No   Other Topics Concern  . None   Social History Narrative   Does not et regular exercise   Daily caffeine use           REVIEW OF SYSTEMS - PERTINENT POSITIVES ONLY: 12 point review of systems negative other than HPI and PMH except for shortness of breath, asthma, diabetes, joint pain.  EXAM: Filed Vitals:   11/20/12 1445  BP: 132/80  Pulse: 84  Temp: 97.1 F (36.2 C)  Resp: 16    Gen:  No acute distress.  Well nourished and well groomed.   Neurological: Alert and oriented to person, place, and time. Coordination normal.  Head: Normocephalic and atraumatic.  Eyes: Conjunctivae are normal. Pupils are equal, round, and reactive to light. No scleral icterus.  Neck: Normal range of motion. Neck supple. No tracheal deviation or thyromegaly present.  Cardiovascular: Normal rate, regular rhythm, normal heart sounds and intact distal pulses.  Exam reveals no gallop and no friction rub.  No murmur heard. Respiratory: Effort normal.  No respiratory distress. No chest  wall tenderness. Breath sounds normal.  No wheezes, rales or rhonchi.  GI: Soft. Bowel sounds are normal. The abdomen is soft and nontender.  There is no rebound and no guarding. Rectus diastasis. Musculoskeletal: Normal range of motion. Extremities are nontender.  Lymphadenopathy: No cervical, preauricular, postauricular or axillary adenopathy is present Skin: Skin is warm and dry. No rash noted. No diaphoresis. No erythema. No pallor. No clubbing, cyanosis, or edema.   Psychiatric: Normal mood and affect. Behavior is normal. Judgment and thought content normal.    LABORATORY RESULTS: Available labs are reviewed  CMET OK, CBC OK.  RADIOLOGY RESULTS: See E-Chart or I-Site for most recent results.  Images and reports are reviewed.  CT chest/abd/pelvis IMPRESSION:  1. Cardiac enlargement.  2. No findings for metastatic disease involving the chest. IMPRESSION:  Unremarkable CT abdomen/pelvis.     ASSESSMENT AND PLAN: Cecal cancer Patient with new diagnosis of cecal cancer. There is no evidence of metastatic disease. We are still awaiting her CEA level. She will need a right hemicolectomy. She is on Coumadin and has a history of valvular disease, pulmonary hypertension, and right heart diastolic dysfunction. She sees Dr. Katz for this. We will need cardiac clearance and permission to stop her Coumadin.  I reviewed the risks of surgery including   bleeding, infection, damage to adjacent structures, and anastomotic leak, hernia,blood clot, and cardiac complications.  There is a risk of finding metastatic disease or nodal disease which would require chemotherapy.    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       Katrianna Friesenhahn L Elroy Schembri MD Surgical Oncology, General and Endocrine Surgery [facility]      Visit Diagnoses: 1. Cecal cancer     Primary Care Physician: MCKEOWN,WILLIAM DAVID,  MD    

## 2012-11-21 ENCOUNTER — Ambulatory Visit (INDEPENDENT_AMBULATORY_CARE_PROVIDER_SITE_OTHER): Payer: BC Managed Care – PPO | Admitting: *Deleted

## 2012-11-21 ENCOUNTER — Other Ambulatory Visit: Payer: BC Managed Care – PPO

## 2012-11-21 DIAGNOSIS — Z8679 Personal history of other diseases of the circulatory system: Secondary | ICD-10-CM

## 2012-11-21 DIAGNOSIS — I4891 Unspecified atrial fibrillation: Secondary | ICD-10-CM

## 2012-11-21 DIAGNOSIS — Z7901 Long term (current) use of anticoagulants: Secondary | ICD-10-CM

## 2012-11-21 NOTE — Progress Notes (Signed)
I know this patient well. She is cleared for surgery. Her coumadin can be held. No bridging is needed.  Anita Bonito, MD

## 2012-11-22 ENCOUNTER — Telehealth: Payer: Self-pay | Admitting: Cardiology

## 2012-11-22 ENCOUNTER — Other Ambulatory Visit: Payer: Self-pay

## 2012-11-22 LAB — CEA: CEA: 2 ng/mL (ref 0.0–5.0)

## 2012-11-22 MED ORDER — FUROSEMIDE 40 MG PO TABS: 20.0000 mg | ORAL_TABLET | Freq: Every day | ORAL | Status: DC

## 2012-11-22 MED ORDER — ATORVASTATIN CALCIUM 40 MG PO TABS: 40.0000 mg | ORAL_TABLET | Freq: Every day | ORAL | Status: DC

## 2012-11-22 NOTE — Telephone Encounter (Signed)
Anita Duke states that she missed her appt because of the snow.  She wants to know if she should reschedule or wait until after her surgery?  I told her I would forward this message to Dr Myrtis Ser and if he wanted to see her soon, I would call her back.  Otherwise I told her to call me to reschedule her appt after her surgery is over and she is feeling better.

## 2012-11-22 NOTE — Telephone Encounter (Signed)
New PRoblem:    Patient called in wanting to know if she needed to be seen by Dr. Myrtis Ser again.  Please call back.

## 2012-11-25 ENCOUNTER — Encounter (INDEPENDENT_AMBULATORY_CARE_PROVIDER_SITE_OTHER): Payer: BC Managed Care – PPO | Admitting: General Surgery

## 2012-11-26 ENCOUNTER — Encounter (HOSPITAL_COMMUNITY): Payer: Self-pay | Admitting: Pharmacy Technician

## 2012-11-26 ENCOUNTER — Encounter (HOSPITAL_COMMUNITY): Payer: Self-pay

## 2012-11-26 ENCOUNTER — Encounter (HOSPITAL_COMMUNITY)
Admission: RE | Admit: 2012-11-26 | Discharge: 2012-11-26 | Disposition: A | Discharge status: Home / Self Care | Payer: BC Managed Care – PPO | Source: Ambulatory Visit | Attending: General Surgery | Admitting: General Surgery

## 2012-11-26 DIAGNOSIS — Z01812 Encounter for preprocedural laboratory examination: Secondary | ICD-10-CM | POA: Insufficient documentation

## 2012-11-26 DIAGNOSIS — Z01818 Encounter for other preprocedural examination: Secondary | ICD-10-CM | POA: Insufficient documentation

## 2012-11-26 HISTORY — DX: Unspecified cataract: H26.9

## 2012-11-26 HISTORY — DX: Major depressive disorder, single episode, unspecified: F32.9

## 2012-11-26 LAB — URINALYSIS, ROUTINE W REFLEX MICROSCOPIC
Bilirubin Urine: NEGATIVE
Ketones, ur: NEGATIVE mg/dL
Nitrite: NEGATIVE
pH: 5 (ref 5.0–8.0)

## 2012-11-26 LAB — COMPREHENSIVE METABOLIC PANEL
Albumin: 3.7 g/dL (ref 3.5–5.2)
BUN: 17 mg/dL (ref 6–23)
Calcium: 9.2 mg/dL (ref 8.4–10.5)
Chloride: 104 mEq/L (ref 96–112)
Creatinine, Ser: 0.82 mg/dL (ref 0.50–1.10)
Total Bilirubin: 0.5 mg/dL (ref 0.3–1.2)
Total Protein: 6.9 g/dL (ref 6.0–8.3)

## 2012-11-26 LAB — URINE MICROSCOPIC-ADD ON

## 2012-11-26 LAB — CBC WITH DIFFERENTIAL/PLATELET
Basophils Absolute: 0 10*3/uL (ref 0.0–0.1)
Basophils Relative: 1 % (ref 0–1)
Eosinophils Absolute: 0.1 10*3/uL (ref 0.0–0.7)
Eosinophils Relative: 1 % (ref 0–5)
HCT: 35.5 % — ABNORMAL LOW (ref 36.0–46.0)
MCH: 22.7 pg — ABNORMAL LOW (ref 26.0–34.0)
MCHC: 29.6 g/dL — ABNORMAL LOW (ref 30.0–36.0)
MCV: 76.8 fL — ABNORMAL LOW (ref 78.0–100.0)
Monocytes Absolute: 1.1 10*3/uL — ABNORMAL HIGH (ref 0.1–1.0)
RDW: 18 % — ABNORMAL HIGH (ref 11.5–15.5)

## 2012-11-26 LAB — TYPE AND SCREEN: ABO/RH(D): O POS

## 2012-11-26 LAB — ABO/RH: ABO/RH(D): O POS

## 2012-11-26 LAB — PROTIME-INR
INR: 2.1 — ABNORMAL HIGH (ref 0.00–1.49)
Prothrombin Time: 22.7 seconds — ABNORMAL HIGH (ref 11.6–15.2)

## 2012-11-26 NOTE — Pre-Procedure Instructions (Addendum)
Anita Duke  11/26/2012   Your procedure is scheduled on:  Tues, April 1 @ 12:00 PM  Report to [facility] at 10:00 AM.  Call this number if you have problems the morning of surgery: [phone]   Remember:   Do not eat food or drink liquids after midnight.   Take these medicines the morning of surgery with A SIP OF WATER: Albuterol<Bring Your Inhaler With You>,Carvedilol(Coreg),Claritin(Loratadine), and Effexor(Venlafaxine)               Stop taking your Fish Oil,Aspirin,and Coumadin.No Goody's,BC's,Aleve, or Herbal Medications   Do not wear jewelry, make-up or nail polish.  Do not wear lotions, powders, or perfumes. You may wear deodorant.  Do not shave 48 hours prior to surgery.   Do not bring valuables to the hospital.  Contacts, dentures or bridgework may not be worn into surgery.  Leave suitcase in the car. After surgery it may be brought to your room.  For patients admitted to the hospital, checkout time is 11:00 AM the day of  discharge.   Patients discharged the day of surgery will not be allowed to drive  home.    Special Instructions: Shower using CHG 2 nights before surgery and the night before surgery.  If you shower the day of surgery use CHG.  Use special wash - you have one bottle of CHG for all showers.  You should use approximately 1/3 of the bottle for each shower.   Please read over the following fact sheets that you were given: Pain Booklet, Coughing and Deep Breathing, Blood Transfusion Information, MRSA Information and Surgical Site Infection Prevention

## 2012-11-26 NOTE — Progress Notes (Signed)
Anesthesia PAT Evaluation:  Patient is a 72 year old female scheduled for laparoscopic ileocolectomy, possible hernia repair on 12/03/12 by Dr. Donell Beers.  History includes cecal cancer (adenocarcinoma), CAD/Mitral valve disease s/p CABG (LIMA to LAD) and MV repair/MAZE in  2001([facility]), non-ischemic cardiomyopathy, afib s/p multiple cardioversions and ablation 05/28/13, SSS s/p Medtronic Adapta L dual chamber PPM 05/17/2012, HTN, DM2, anemia, diverticulosis, COPD, pulmonary HTN, asthma, non-smoker, obesity, post-operative N/V.  PCP is Dr. Lucky Cowboy. Pulmonologist is Dr. Despina Pole, NP  Cardiologist is Dr. Myrtis Ser.  He is aware of planned procedure and has cleared from a cardiac standpoint.  He did not feel Lovenox bridging was needed while her Coumadin was on hold.  EP Cardiologist is Dr. Johney Frame, last visit 10/25/12.  I evaluated patient during her PAT visit.  She denies chest pain, edema, new SOB.  She does have chronic DOE with activity such as walking to and from the mailbox.  This is stable and somewhat improved since being started on Symbicort.  She reports good DM control.  Exam shows a pleasant, Caucasian female in NAD. Heart RRR without significant murmur noted.  Lungs are clear.  No carotid bruits or LE edema noted.    EKG on 09/12/12 showed a v-paced rhythm.  2D echo on 08/08/12 showed: - Left ventricle: The cavity size was mildly dilated. Wall thickness was increased in a pattern of mild LVH. Systolic function was mildly to moderately reduced. The estimated ejection fraction was in the range of 40% to 45%. There is hypokinesis of the apical myocardium. - Ventricular septum: Septal motion showed dyssynergy. These changes are consistent with right ventricular pacing. - Mitral valve: Calcified annulus. Mild thickening of the anterior leaflet, consistent with rheumatic disease. The findings are consistent with trivial stenosis. Mild regurgitation. - Left atrium: The atrium was severely  dilated. - Right atrium: The atrium was mildly dilated. - Tricuspid valve: Mild regurgitation. - Pulmonary arteries: Systolic pressure was mildly increased. PA peak pressure: 36mm Hg (S).  Cardiac cath on 10/18/09 showed: Normal left main. LAD was a large vessel that courses the apex and gave off a large diagonal branch. There was 20-30% stenosis at the takeoff of the diagonal branch in the mid LAD. Circumflex artery was a large caliber vessel that gave off 2 very small caliber obtuse marginal branches. The AV groove circumflex was small. The third obtuse marginal comprise the majority of the vessel. RCA was a large dominant vessel with mild luminal irregularities. EF 30% with global left ventricular systolic dysfunction. The left internal mammary artery graft was selectively engaged and injected and was atretic.  CXR on 08/09/12 showed: Enlargement of cardiac silhouette. Mild chronic bronchitic changes.   CT of the chest/abd/pelvis on 11/19/12 showed: 1. Cardiac enlargement. 2. No findings for metastatic disease involving the chest. 3. Unremarkable CT of the abdomen/pelvis.  PFTs on 11/10/10 showed FEV1 1.69 (92%).  Mild obstructive airway disease.  Mild response to bronchodilator.  Normal lung volume.  Diffusion moderately reduced.  Preoperative labs noted.  H/H 10.5/35.5, stable.  Glucose 138, Cr 0.82.  UA moderate leukocytes, negative nitrites.  PT/INR 22.7/2.10.  (She is scheduled to hold her Coumadin starting 11/28/12.).  Plan to repeat coags on arrival.      She has been cleared by her cardiologist.  She feels at baseline and is without any acute cardiopulmonary symptoms.  Heart rate was regular on exam with a controlled rate.  If follow-up labs are reasonable, then would anticipate she could proceed as  planned from an anesthesia standpoint.  She will be evaluated by her assigned anesthesiologist on the day of surgery to discuss the definitive anesthesia plan.  Velna Ochs [facility]/Anesthesiology Phone [phone] 11/26/2012 5:17 PM

## 2012-11-26 NOTE — Progress Notes (Signed)
Dr.Jeffrey Myrtis Ser @ [facility]-last office visit in epic  Echo reports in epic from 2011 and 2013  Heart cath report in epic from 05/2012  Stress test > 74yrs ago  EKG report in epic from 09/12/12  CXR in epic from 08/12/12  Dr.William Oneta Rack is Medical Md

## 2012-12-02 MED ORDER — ALVIMOPAN 12 MG PO CAPS
12.0000 mg | ORAL_CAPSULE | Freq: Once | ORAL | Status: AC
  Administered 2012-12-03: 12 mg via ORAL
  Filled 2012-12-02: qty 1

## 2012-12-02 MED ORDER — SODIUM CHLORIDE 0.9 % IV SOLN
1.0000 g | INTRAVENOUS | Status: AC
  Administered 2012-12-03: 1 g via INTRAVENOUS
  Filled 2012-12-02: qty 1

## 2012-12-02 NOTE — Progress Notes (Signed)
Patient notified of change in arrival time to 0730 tomorrow morning

## 2012-12-03 ENCOUNTER — Encounter (HOSPITAL_COMMUNITY)
Admission: RE | Disposition: A | Discharge status: Home / Self Care | Payer: Self-pay | Source: Ambulatory Visit | Attending: General Surgery

## 2012-12-03 ENCOUNTER — Encounter (HOSPITAL_COMMUNITY): Admission: RE | Payer: Self-pay | Source: Ambulatory Visit

## 2012-12-03 ENCOUNTER — Encounter (HOSPITAL_COMMUNITY): Payer: Self-pay | Admitting: *Deleted

## 2012-12-03 ENCOUNTER — Inpatient Hospital Stay (HOSPITAL_COMMUNITY): Payer: BC Managed Care – PPO | Admitting: Anesthesiology

## 2012-12-03 ENCOUNTER — Inpatient Hospital Stay (HOSPITAL_COMMUNITY): Admission: RE | Admit: 2012-12-03 | Payer: BC Managed Care – PPO | Source: Ambulatory Visit | Admitting: General Surgery

## 2012-12-03 ENCOUNTER — Inpatient Hospital Stay (HOSPITAL_COMMUNITY)
Admission: RE | Admit: 2012-12-03 | Discharge: 2012-12-08 | DRG: 148 | Disposition: A | Discharge status: Home / Self Care | Payer: BC Managed Care – PPO | Source: Ambulatory Visit | Attending: General Surgery | Admitting: General Surgery

## 2012-12-03 ENCOUNTER — Encounter (HOSPITAL_COMMUNITY): Payer: Self-pay | Admitting: Vascular Surgery

## 2012-12-03 DIAGNOSIS — E119 Type 2 diabetes mellitus without complications: Secondary | ICD-10-CM | POA: Diagnosis present

## 2012-12-03 DIAGNOSIS — Z79899 Other long term (current) drug therapy: Secondary | ICD-10-CM

## 2012-12-03 DIAGNOSIS — Z7901 Long term (current) use of anticoagulants: Secondary | ICD-10-CM

## 2012-12-03 DIAGNOSIS — I4891 Unspecified atrial fibrillation: Secondary | ICD-10-CM | POA: Diagnosis present

## 2012-12-03 DIAGNOSIS — Y838 Other surgical procedures as the cause of abnormal reaction of the patient, or of later complication, without mention of misadventure at the time of the procedure: Secondary | ICD-10-CM | POA: Diagnosis not present

## 2012-12-03 DIAGNOSIS — C189 Malignant neoplasm of colon, unspecified: Secondary | ICD-10-CM

## 2012-12-03 DIAGNOSIS — J449 Chronic obstructive pulmonary disease, unspecified: Secondary | ICD-10-CM | POA: Diagnosis present

## 2012-12-03 DIAGNOSIS — K929 Disease of digestive system, unspecified: Secondary | ICD-10-CM | POA: Diagnosis not present

## 2012-12-03 DIAGNOSIS — D490 Neoplasm of unspecified behavior of digestive system: Secondary | ICD-10-CM

## 2012-12-03 DIAGNOSIS — M6200 Separation of muscle (nontraumatic), unspecified site: Secondary | ICD-10-CM | POA: Diagnosis present

## 2012-12-03 DIAGNOSIS — E785 Hyperlipidemia, unspecified: Secondary | ICD-10-CM | POA: Diagnosis present

## 2012-12-03 DIAGNOSIS — I2510 Atherosclerotic heart disease of native coronary artery without angina pectoris: Secondary | ICD-10-CM | POA: Diagnosis present

## 2012-12-03 DIAGNOSIS — Z950 Presence of cardiac pacemaker: Secondary | ICD-10-CM

## 2012-12-03 DIAGNOSIS — Z951 Presence of aortocoronary bypass graft: Secondary | ICD-10-CM

## 2012-12-03 DIAGNOSIS — I10 Essential (primary) hypertension: Secondary | ICD-10-CM | POA: Diagnosis present

## 2012-12-03 DIAGNOSIS — C180 Malignant neoplasm of cecum: Principal | ICD-10-CM | POA: Diagnosis present

## 2012-12-03 DIAGNOSIS — J4489 Other specified chronic obstructive pulmonary disease: Secondary | ICD-10-CM | POA: Diagnosis present

## 2012-12-03 DIAGNOSIS — D649 Anemia, unspecified: Secondary | ICD-10-CM | POA: Diagnosis present

## 2012-12-03 DIAGNOSIS — K429 Umbilical hernia without obstruction or gangrene: Secondary | ICD-10-CM | POA: Diagnosis present

## 2012-12-03 DIAGNOSIS — Z7982 Long term (current) use of aspirin: Secondary | ICD-10-CM

## 2012-12-03 DIAGNOSIS — F3289 Other specified depressive episodes: Secondary | ICD-10-CM | POA: Diagnosis present

## 2012-12-03 DIAGNOSIS — I2789 Other specified pulmonary heart diseases: Secondary | ICD-10-CM | POA: Diagnosis present

## 2012-12-03 DIAGNOSIS — K560 Paralytic ileus: Secondary | ICD-10-CM | POA: Diagnosis not present

## 2012-12-03 DIAGNOSIS — J45909 Unspecified asthma, uncomplicated: Secondary | ICD-10-CM | POA: Diagnosis present

## 2012-12-03 DIAGNOSIS — K7689 Other specified diseases of liver: Secondary | ICD-10-CM | POA: Diagnosis present

## 2012-12-03 DIAGNOSIS — E7800 Pure hypercholesterolemia, unspecified: Secondary | ICD-10-CM | POA: Diagnosis present

## 2012-12-03 DIAGNOSIS — Z01812 Encounter for preprocedural laboratory examination: Secondary | ICD-10-CM

## 2012-12-03 DIAGNOSIS — I252 Old myocardial infarction: Secondary | ICD-10-CM

## 2012-12-03 DIAGNOSIS — F329 Major depressive disorder, single episode, unspecified: Secondary | ICD-10-CM | POA: Diagnosis present

## 2012-12-03 HISTORY — DX: Malignant neoplasm of cecum: C18.0

## 2012-12-03 HISTORY — DX: Gastro-esophageal reflux disease without esophagitis: K21.9

## 2012-12-03 HISTORY — PX: LAPAROSCOPIC RIGHT HEMI COLECTOMY: SHX5926

## 2012-12-03 HISTORY — PX: DIAGNOSTIC LAPAROSCOPIC LIVER BIOPSY: SHX5797

## 2012-12-03 HISTORY — PX: UMBILICAL HERNIA REPAIR: SHX196

## 2012-12-03 LAB — GLUCOSE, CAPILLARY: Glucose-Capillary: 99 mg/dL (ref 70–99)

## 2012-12-03 LAB — CBC
HCT: 31.1 % — ABNORMAL LOW (ref 36.0–46.0)
Hemoglobin: 9.5 g/dL — ABNORMAL LOW (ref 12.0–15.0)
MCH: 22.8 pg — ABNORMAL LOW (ref 26.0–34.0)
MCHC: 30.5 g/dL (ref 30.0–36.0)
RDW: 17.9 % — ABNORMAL HIGH (ref 11.5–15.5)

## 2012-12-03 LAB — APTT: aPTT: 33 seconds (ref 24–37)

## 2012-12-03 LAB — HEMOGLOBIN A1C: Mean Plasma Glucose: 166 mg/dL — ABNORMAL HIGH (ref <117)

## 2012-12-03 LAB — CREATININE, SERUM: GFR calc non Af Amer: >90 mL/min (ref >90)

## 2012-12-03 SURGERY — EXCISION, CECUM WITH ILEUM, LAPAROSCOPIC
Anesthesia: General

## 2012-12-03 SURGERY — LAPAROSCOPIC RIGHT HEMI COLECTOMY
Anesthesia: General | Site: Abdomen | Wound class: Contaminated

## 2012-12-03 MED ORDER — NALOXONE HCL 0.4 MG/ML IJ SOLN: 0.4000 mg | INTRAMUSCULAR | Status: DC | PRN

## 2012-12-03 MED ORDER — DIPHENHYDRAMINE HCL 12.5 MG/5ML PO ELIX: 12.5000 mg | ORAL_SOLUTION | Freq: Four times a day (QID) | ORAL | Status: DC | PRN

## 2012-12-03 MED ORDER — ONDANSETRON HCL 4 MG/2ML IJ SOLN: 4.0000 mg | Freq: Four times a day (QID) | INTRAMUSCULAR | Status: DC | PRN

## 2012-12-03 MED ORDER — LIDOCAINE HCL 2 % IJ SOLN
INTRAMUSCULAR | Status: DC | PRN
  Administered 2012-12-03: 20 mL

## 2012-12-03 MED ORDER — HYDROMORPHONE HCL PF 1 MG/ML IJ SOLN
INTRAMUSCULAR | Status: AC
  Filled 2012-12-03: qty 1

## 2012-12-03 MED ORDER — ONDANSETRON HCL 4 MG PO TABS: 4.0000 mg | ORAL_TABLET | Freq: Four times a day (QID) | ORAL | Status: DC | PRN

## 2012-12-03 MED ORDER — MIDAZOLAM HCL 5 MG/5ML IJ SOLN
INTRAMUSCULAR | Status: DC | PRN
  Administered 2012-12-03: 2 mg via INTRAVENOUS

## 2012-12-03 MED ORDER — 0.9 % SODIUM CHLORIDE (POUR BTL) OPTIME
TOPICAL | Status: DC | PRN
  Administered 2012-12-03 (×2): 1000 mL

## 2012-12-03 MED ORDER — FUROSEMIDE 20 MG PO TABS
20.0000 mg | ORAL_TABLET | Freq: Every day | ORAL | Status: DC
  Administered 2012-12-05 – 2012-12-08 (×4): 20 mg via ORAL
  Filled 2012-12-03 (×4): qty 1

## 2012-12-03 MED ORDER — LACTATED RINGERS IV SOLN
INTRAVENOUS | Status: DC | PRN
  Administered 2012-12-03 (×2): via INTRAVENOUS

## 2012-12-03 MED ORDER — HYDROMORPHONE 0.3 MG/ML IV SOLN
INTRAVENOUS | Status: DC
  Administered 2012-12-03: 1.19 mg via INTRAVENOUS
  Administered 2012-12-04: 1.39 mg via INTRAVENOUS
  Administered 2012-12-04: 10:00:00 via INTRAVENOUS
  Administered 2012-12-04: 0.6 mg via INTRAVENOUS
  Administered 2012-12-04: 2.99 mg via INTRAVENOUS
  Administered 2012-12-04: 1.19 mg via INTRAVENOUS
  Administered 2012-12-04: 0.599 mg via INTRAVENOUS
  Administered 2012-12-04: 1.79 mg via INTRAVENOUS
  Administered 2012-12-04: 0.2 mg via INTRAVENOUS
  Administered 2012-12-05: 1.19 mg via INTRAVENOUS
  Administered 2012-12-05: 0.199 mg via INTRAVENOUS
  Administered 2012-12-05 – 2012-12-06 (×2): 0.6 mg via INTRAVENOUS
  Administered 2012-12-06: 0.2 mg via INTRAVENOUS
  Administered 2012-12-06: 1.59 mg via INTRAVENOUS
  Administered 2012-12-06: 01:00:00 via INTRAVENOUS
  Administered 2012-12-07: 0.599 mg via INTRAVENOUS
  Administered 2012-12-07: 0.4 mg via INTRAVENOUS
  Filled 2012-12-03 (×2): qty 25

## 2012-12-03 MED ORDER — BUPIVACAINE-EPINEPHRINE PF 0.25-1:200000 % IJ SOLN
INTRAMUSCULAR | Status: AC
  Filled 2012-12-03: qty 30

## 2012-12-03 MED ORDER — HEPARIN SODIUM (PORCINE) 5000 UNIT/ML IJ SOLN
5000.0000 [IU] | Freq: Three times a day (TID) | INTRAMUSCULAR | Status: DC
  Administered 2012-12-03 – 2012-12-08 (×14): 5000 [IU] via SUBCUTANEOUS
  Filled 2012-12-03 (×20): qty 1

## 2012-12-03 MED ORDER — HYDROMORPHONE HCL PF 1 MG/ML IJ SOLN: 0.5000 mg | INTRAMUSCULAR | Status: DC | PRN

## 2012-12-03 MED ORDER — INSULIN ASPART 100 UNIT/ML ~~LOC~~ SOLN
0.0000 [IU] | SUBCUTANEOUS | Status: DC
  Administered 2012-12-03 – 2012-12-05 (×5): 1 [IU] via SUBCUTANEOUS
  Administered 2012-12-05: 2 [IU] via SUBCUTANEOUS
  Administered 2012-12-05 (×3): 1 [IU] via SUBCUTANEOUS
  Administered 2012-12-06: 21:00:00 via SUBCUTANEOUS
  Administered 2012-12-06: 1 [IU] via SUBCUTANEOUS
  Administered 2012-12-06: 2 [IU] via SUBCUTANEOUS
  Administered 2012-12-07 (×2): 1 [IU] via SUBCUTANEOUS
  Administered 2012-12-07 (×2): 2 [IU] via SUBCUTANEOUS
  Administered 2012-12-07 – 2012-12-08 (×3): 1 [IU] via SUBCUTANEOUS

## 2012-12-03 MED ORDER — TRANDOLAPRIL 4 MG PO TABS
8.0000 mg | ORAL_TABLET | Freq: Every day | ORAL | Status: DC
  Administered 2012-12-04 – 2012-12-08 (×5): 8 mg via ORAL
  Filled 2012-12-03 (×5): qty 2

## 2012-12-03 MED ORDER — MORPHINE SULFATE (PF) 1 MG/ML IV SOLN
INTRAVENOUS | Status: AC
  Filled 2012-12-03: qty 25

## 2012-12-03 MED ORDER — SODIUM CHLORIDE 0.9 % IJ SOLN: 9.0000 mL | INTRAMUSCULAR | Status: DC | PRN

## 2012-12-03 MED ORDER — OXYCODONE HCL 5 MG PO TABS: 5.0000 mg | ORAL_TABLET | Freq: Once | ORAL | Status: DC | PRN

## 2012-12-03 MED ORDER — ACETAMINOPHEN 10 MG/ML IV SOLN
INTRAVENOUS | Status: DC | PRN
  Administered 2012-12-03: 1000 mg via INTRAVENOUS

## 2012-12-03 MED ORDER — OXYCODONE HCL 5 MG/5ML PO SOLN: 5.0000 mg | Freq: Once | ORAL | Status: DC | PRN

## 2012-12-03 MED ORDER — ACETAMINOPHEN 10 MG/ML IV SOLN
INTRAVENOUS | Status: AC
  Filled 2012-12-03: qty 100

## 2012-12-03 MED ORDER — BUDESONIDE-FORMOTEROL FUMARATE 160-4.5 MCG/ACT IN AERO
2.0000 | INHALATION_SPRAY | Freq: Two times a day (BID) | RESPIRATORY_TRACT | Status: DC
  Administered 2012-12-04 – 2012-12-08 (×9): 2 via RESPIRATORY_TRACT
  Filled 2012-12-03 (×2): qty 6

## 2012-12-03 MED ORDER — HYDROCODONE-ACETAMINOPHEN 5-325 MG PO TABS: 1.0000 | ORAL_TABLET | ORAL | Status: DC | PRN

## 2012-12-03 MED ORDER — KCL IN DEXTROSE-NACL 20-5-0.45 MEQ/L-%-% IV SOLN
INTRAVENOUS | Status: DC
  Administered 2012-12-04 – 2012-12-07 (×4): via INTRAVENOUS
  Filled 2012-12-03 (×11): qty 1000

## 2012-12-03 MED ORDER — DIPHENHYDRAMINE HCL 50 MG/ML IJ SOLN: 12.5000 mg | Freq: Four times a day (QID) | INTRAMUSCULAR | Status: DC | PRN

## 2012-12-03 MED ORDER — HYDROMORPHONE 0.3 MG/ML IV SOLN
INTRAVENOUS | Status: DC
  Administered 2012-12-03: 0.3 mg via INTRAVENOUS
  Administered 2012-12-03: 15:00:00 via INTRAVENOUS

## 2012-12-03 MED ORDER — ONDANSETRON HCL 4 MG/2ML IJ SOLN
INTRAMUSCULAR | Status: DC | PRN
  Administered 2012-12-03: 4 mg via INTRAVENOUS

## 2012-12-03 MED ORDER — LORATADINE 10 MG PO TABS
10.0000 mg | ORAL_TABLET | Freq: Every day | ORAL | Status: DC
  Administered 2012-12-04 – 2012-12-08 (×5): 10 mg via ORAL
  Filled 2012-12-03 (×6): qty 1

## 2012-12-03 MED ORDER — SODIUM CHLORIDE 0.9 % IR SOLN
Status: DC | PRN
  Administered 2012-12-03: 1000 mL

## 2012-12-03 MED ORDER — VENLAFAXINE HCL 37.5 MG PO TABS
37.5000 mg | ORAL_TABLET | Freq: Every day | ORAL | Status: DC
  Administered 2012-12-04 – 2012-12-08 (×5): 37.5 mg via ORAL
  Filled 2012-12-03 (×6): qty 1

## 2012-12-03 MED ORDER — BUPIVACAINE-EPINEPHRINE 0.25% -1:200000 IJ SOLN
INTRAMUSCULAR | Status: DC | PRN
  Administered 2012-12-03: 20 mL

## 2012-12-03 MED ORDER — GLYCOPYRROLATE 0.2 MG/ML IJ SOLN
INTRAMUSCULAR | Status: DC | PRN
  Administered 2012-12-03: .4 mg via INTRAVENOUS

## 2012-12-03 MED ORDER — DEXTROSE 5 % IV SOLN
1.0000 g | Freq: Four times a day (QID) | INTRAVENOUS | Status: AC
  Administered 2012-12-03 – 2012-12-04 (×2): 1 g via INTRAVENOUS
  Filled 2012-12-03 (×4): qty 1

## 2012-12-03 MED ORDER — LIDOCAINE HCL 2 % IJ SOLN
INTRAMUSCULAR | Status: AC
  Filled 2012-12-03: qty 20

## 2012-12-03 MED ORDER — NEOSTIGMINE METHYLSULFATE 1 MG/ML IJ SOLN
INTRAMUSCULAR | Status: DC | PRN
  Administered 2012-12-03: 3 mg via INTRAVENOUS

## 2012-12-03 MED ORDER — HYDROMORPHONE 0.3 MG/ML IV SOLN
INTRAVENOUS | Status: AC
  Filled 2012-12-03: qty 25

## 2012-12-03 MED ORDER — ROCURONIUM BROMIDE 100 MG/10ML IV SOLN
INTRAVENOUS | Status: DC | PRN
  Administered 2012-12-03: 50 mg via INTRAVENOUS

## 2012-12-03 MED ORDER — HYDROMORPHONE HCL PF 1 MG/ML IJ SOLN
0.2500 mg | INTRAMUSCULAR | Status: DC | PRN
  Administered 2012-12-03 (×4): 0.5 mg via INTRAVENOUS

## 2012-12-03 MED ORDER — FENTANYL CITRATE 0.05 MG/ML IJ SOLN
INTRAMUSCULAR | Status: DC | PRN
  Administered 2012-12-03 (×2): 50 ug via INTRAVENOUS
  Administered 2012-12-03: 100 ug via INTRAVENOUS
  Administered 2012-12-03: 50 ug via INTRAVENOUS

## 2012-12-03 MED ORDER — LACTATED RINGERS IV SOLN
INTRAVENOUS | Status: DC
  Administered 2012-12-03: 09:00:00 via INTRAVENOUS

## 2012-12-03 MED ORDER — CARVEDILOL 3.125 MG PO TABS
3.1250 mg | ORAL_TABLET | Freq: Two times a day (BID) | ORAL | Status: DC
  Administered 2012-12-03 – 2012-12-08 (×11): 3.125 mg via ORAL
  Filled 2012-12-03 (×14): qty 1

## 2012-12-03 MED ORDER — KCL IN DEXTROSE-NACL 20-5-0.45 MEQ/L-%-% IV SOLN
INTRAVENOUS | Status: AC
  Administered 2012-12-03: 1000 mL via INTRAVENOUS
  Filled 2012-12-03: qty 1000

## 2012-12-03 MED ORDER — LIDOCAINE HCL (CARDIAC) 20 MG/ML IV SOLN
INTRAVENOUS | Status: DC | PRN
  Administered 2012-12-03: 70 mg via INTRAVENOUS

## 2012-12-03 MED ORDER — PROPOFOL 10 MG/ML IV BOLUS
INTRAVENOUS | Status: DC | PRN
  Administered 2012-12-03: 160 mg via INTRAVENOUS

## 2012-12-03 MED ORDER — ALVIMOPAN 12 MG PO CAPS
12.0000 mg | ORAL_CAPSULE | Freq: Two times a day (BID) | ORAL | Status: DC
  Administered 2012-12-04 – 2012-12-06 (×6): 12 mg via ORAL
  Filled 2012-12-03 (×9): qty 1

## 2012-12-03 MED ORDER — ACETAMINOPHEN 10 MG/ML IV SOLN
1000.0000 mg | Freq: Four times a day (QID) | INTRAVENOUS | Status: AC
  Administered 2012-12-03 – 2012-12-04 (×4): 1000 mg via INTRAVENOUS
  Filled 2012-12-03 (×4): qty 100

## 2012-12-03 MED ORDER — ATORVASTATIN CALCIUM 40 MG PO TABS
40.0000 mg | ORAL_TABLET | Freq: Every day | ORAL | Status: DC
  Administered 2012-12-03 – 2012-12-08 (×6): 40 mg via ORAL
  Filled 2012-12-03 (×6): qty 1

## 2012-12-03 SURGICAL SUPPLY — 83 items
APPLICATOR COTTON TIP 6IN STRL (MISCELLANEOUS) ×5 IMPLANT
APPLIER CLIP ROT 10 11.4 M/L (STAPLE)
BENZOIN TINCTURE PRP APPL 2/3 (GAUZE/BANDAGES/DRESSINGS) ×5 IMPLANT
BLADE SURG 10 STRL SS (BLADE) ×5 IMPLANT
BLADE SURG ROTATE 9660 (MISCELLANEOUS) ×5 IMPLANT
CANISTER SUCTION 2500CC (MISCELLANEOUS) ×5 IMPLANT
CELLS DAT CNTRL 66122 CELL SVR (MISCELLANEOUS) ×4 IMPLANT
CHLORAPREP W/TINT 26ML (MISCELLANEOUS) ×5 IMPLANT
CLIP APPLIE ROT 10 11.4 M/L (STAPLE) IMPLANT
CLOTH BEACON ORANGE TIMEOUT ST (SAFETY) ×5 IMPLANT
CLSR STERI-STRIP ANTIMIC 1/2X4 (GAUZE/BANDAGES/DRESSINGS) ×5 IMPLANT
COVER SURGICAL LIGHT HANDLE (MISCELLANEOUS) ×5 IMPLANT
DECANTER SPIKE VIAL GLASS SM (MISCELLANEOUS) ×10 IMPLANT
DRAPE UTILITY 15X26 W/TAPE STR (DRAPE) ×10 IMPLANT
DRAPE WARM FLUID 44X44 (DRAPE) ×5 IMPLANT
ELECT CAUTERY BLADE 6.4 (BLADE) ×5 IMPLANT
ELECT REM PT RETURN 9FT ADLT (ELECTROSURGICAL) ×5
ELECTRODE REM PT RTRN 9FT ADLT (ELECTROSURGICAL) ×4 IMPLANT
GAUZE SPONGE 2X2 8PLY STRL LF (GAUZE/BANDAGES/DRESSINGS) ×4 IMPLANT
GLOVE BIO SURGEON STRL SZ 6 (GLOVE) ×15 IMPLANT
GLOVE BIO SURGEON STRL SZ7 (GLOVE) ×10 IMPLANT
GLOVE BIO SURGEON STRL SZ7.5 (GLOVE) ×10 IMPLANT
GLOVE BIO SURGEON STRL SZ8 (GLOVE) ×5 IMPLANT
GLOVE BIOGEL PI IND STRL 6.5 (GLOVE) ×8 IMPLANT
GLOVE BIOGEL PI IND STRL 7.0 (GLOVE) ×8 IMPLANT
GLOVE BIOGEL PI IND STRL 7.5 (GLOVE) ×12 IMPLANT
GLOVE BIOGEL PI IND STRL 8 (GLOVE) ×4 IMPLANT
GLOVE BIOGEL PI INDICATOR 6.5 (GLOVE) ×2
GLOVE BIOGEL PI INDICATOR 7.0 (GLOVE) ×2
GLOVE BIOGEL PI INDICATOR 7.5 (GLOVE) ×3
GLOVE BIOGEL PI INDICATOR 8 (GLOVE) ×1
GLOVE ECLIPSE 7.5 STRL STRAW (GLOVE) ×5 IMPLANT
GLOVE SURG SS PI 6.5 STRL IVOR (GLOVE) ×10 IMPLANT
GOWN PREVENTION PLUS XXLARGE (GOWN DISPOSABLE) ×15 IMPLANT
GOWN STRL NON-REIN LRG LVL3 (GOWN DISPOSABLE) ×30 IMPLANT
KIT BASIN OR (CUSTOM PROCEDURE TRAY) ×5 IMPLANT
KIT ROOM TURNOVER OR (KITS) ×5 IMPLANT
LIGASURE 5MM LAPAROSCOPIC (INSTRUMENTS) IMPLANT
LIGASURE IMPACT 36 18CM CVD LR (INSTRUMENTS) IMPLANT
NS IRRIG 1000ML POUR BTL (IV SOLUTION) ×10 IMPLANT
PAD ARMBOARD 7.5X6 YLW CONV (MISCELLANEOUS) ×10 IMPLANT
PENCIL BUTTON HOLSTER BLD 10FT (ELECTRODE) ×5 IMPLANT
RELOAD PROXIMATE 75MM BLUE (ENDOMECHANICALS) ×10 IMPLANT
RTRCTR WOUND ALEXIS 18CM MED (MISCELLANEOUS) ×5
SCALPEL HARMONIC ACE (MISCELLANEOUS) IMPLANT
SCISSORS LAP 5X35 DISP (ENDOMECHANICALS) IMPLANT
SEALER TISSUE G2 STRG ARTC 35C (ENDOMECHANICALS) ×5 IMPLANT
SET IRRIG TUBING LAPAROSCOPIC (IRRIGATION / IRRIGATOR) ×5 IMPLANT
SLEEVE ENDOPATH XCEL 5M (ENDOMECHANICALS) ×15 IMPLANT
SPECIMEN JAR LARGE (MISCELLANEOUS) ×5 IMPLANT
SPONGE GAUZE 2X2 STER 10/PKG (GAUZE/BANDAGES/DRESSINGS) ×1
SPONGE GAUZE 4X4 12PLY (GAUZE/BANDAGES/DRESSINGS) ×5 IMPLANT
SPONGE LAP 18X18 X RAY DECT (DISPOSABLE) ×5 IMPLANT
STAPLER GUN LINEAR PROX 60 (STAPLE) ×5 IMPLANT
STAPLER PROXIMATE 75MM BLUE (STAPLE) ×5 IMPLANT
STAPLER VISISTAT 35W (STAPLE) ×5 IMPLANT
SUCTION POOLE TIP (SUCTIONS) ×5 IMPLANT
SUT MNCRL AB 4-0 PS2 18 (SUTURE) ×10 IMPLANT
SUT PDS AB 1 CT  36 (SUTURE)
SUT PDS AB 1 CT 36 (SUTURE) IMPLANT
SUT PDS II 0 TP-1 LOOPED 60 (SUTURE) ×10 IMPLANT
SUT SILK 2 0 (SUTURE) ×1
SUT SILK 2 0 SH CR/8 (SUTURE) ×5 IMPLANT
SUT SILK 2-0 18XBRD TIE 12 (SUTURE) ×4 IMPLANT
SUT SILK 3 0 (SUTURE) ×1
SUT SILK 3 0 SH CR/8 (SUTURE) ×5 IMPLANT
SUT SILK 3-0 18XBRD TIE 12 (SUTURE) ×4 IMPLANT
SUT VIC AB 2-0 SH 18 (SUTURE) ×5 IMPLANT
SUT VIC AB 3-0 SH 8-18 (SUTURE) ×10 IMPLANT
SYR BULB IRRIGATION 50ML (SYRINGE) ×5 IMPLANT
SYS LAPSCP GELPORT 120MM (MISCELLANEOUS)
SYSTEM LAPSCP GELPORT 120MM (MISCELLANEOUS) IMPLANT
TOWEL OR 17X26 10 PK STRL BLUE (TOWEL DISPOSABLE) ×10 IMPLANT
TRAY FOLEY CATH 14FRSI W/METER (CATHETERS) ×5 IMPLANT
TRAY LAPAROSCOPIC (CUSTOM PROCEDURE TRAY) ×5 IMPLANT
TRAY PROCTOSCOPIC FIBER OPTIC (SET/KITS/TRAYS/PACK) IMPLANT
TROCAR XCEL 12X100 BLDLESS (ENDOMECHANICALS) IMPLANT
TROCAR XCEL BLUNT TIP 100MML (ENDOMECHANICALS) ×5 IMPLANT
TROCAR XCEL NON-BLD 11X100MML (ENDOMECHANICALS) IMPLANT
TROCAR XCEL NON-BLD 5MMX100MML (ENDOMECHANICALS) ×5 IMPLANT
TUBE CONNECTING 12X1/4 (SUCTIONS) ×10 IMPLANT
TUBING FILTER THERMOFLATOR (ELECTROSURGICAL) ×5 IMPLANT
YANKAUER SUCT BULB TIP NO VENT (SUCTIONS) ×10 IMPLANT

## 2012-12-03 NOTE — Interval H&P Note (Signed)
History and Physical Interval Note:  12/03/2012 9:31 AM  Anita Duke  has presented today for surgery, with the diagnosis of cecal cancer  The various methods of treatment have been discussed with the patient and family. After consideration of risks, benefits and other options for treatment, the patient has consented to  Procedure(s): LAPAROSCOPIC ILEOCOLECTOMY, POSSIBLE HERNIA REPAIR (N/A) as a surgical intervention .  The patient's history has been reviewed, patient examined, no change in status, stable for surgery.  I have reviewed the patient's chart and labs.  Questions were answered to the patient's satisfaction.     Hanny Elsberry

## 2012-12-03 NOTE — Preoperative (Signed)
Beta Blockers   Reason not to administer Beta Blockers:Not Applicable 

## 2012-12-03 NOTE — Anesthesia Postprocedure Evaluation (Signed)
Anesthesia Post Note  Patient: Anita Duke  Procedure(s) Performed: Procedure(s) (LRB): LAPAROSCOPIC RIGHT HEMI COLECTOMY HERNIA REPAIR UMBILICAL ADULT (N/A) DIAGNOSTIC LAPAROSCOPIC LIVER BIOPSY (Left)  Anesthesia type: General  Patient location: PACU  Post pain: Pain level controlled and Adequate analgesia  Post assessment: Post-op Vital signs reviewed, Patient's Cardiovascular Status Stable, Respiratory Function Stable, Patent Airway and Pain level controlled  Last Vitals:  Filed Vitals:   12/03/12 1245  BP:   Pulse: 73  Temp:   Resp: 13    Post vital signs: Reviewed and stable  Level of consciousness: awake, alert  and oriented  Complications: No apparent anesthesia complications

## 2012-12-03 NOTE — H&P (View-Only) (Signed)
Chief Complaint  Patient presents with  . New Evaluation    eval rt col ca / possible resection    HISTORY: Patient is a 72 year old female with a new diagnosis of cecal cancer. She had a screening colonoscopy and was found to have a polyp. This was felt to appear benign, but final pathology demonstrated adenocarcinoma. She did not have any rectal bleeding. She denies any weight loss.  She has not had constipation or diarrhea.  She does not have any abdominal pain.  She has no family history of colon cancer.  She does have shortness of breath, and is seeing Dr. Myrtis Ser for cardiac disease.    Past Medical History  Diagnosis Date  . Atrial fibrillation     Treated with maze procedure / recurrent atrial fibrillation February, 2011... TEE cardioversion October 19, 2009  . Atrial tachycardia     February, 2011... Tikosyn started in hospital  . Bradycardia     Post cardioversion February, 2011,/digoxin/Cardizem metoprolol stopped, may need pacemaker  . Mitral regurgitation     Treated with repair, atrial appendage was removed or tied off at surgery.. this was proven by TEE February, 2011  . Pulmonary HTN     55 mmHg, echo, February, 2011 / no mention of pulmonary hypertension echo, June, 2011  . Right ventricular dysfunction     Mild to moderate, echo, February, 2011 / normalized echo, June, 2011  . TR (tricuspid regurgitation)     Moderate, echo, February, 2011 / trivial, echo, June, 2011  . Mitral stenosis     Mild, February, 2011, post mitral valve repair / mild, echo, June, 2011  . CAD (coronary artery disease)     LIMA to the LAD at time of mitral valve repair / LIMA atretic,, February, 2011  . Ejection fraction < 50%     EF 30% echo and TEE diagnosis February, 2011, possibly rate related / no contraction by catheter, bradycardia so carvedilol cannot be used / EF 55-60% echo, June, 2011 / EF 50%, echo, February, 2012  . HTN (hypertension)   . Hemorrhoids, internal   . Diverticulosis of  colon   . Colon polyp, hyperplastic   . Environmental allergies   . atrial appendage     Removed were tied off at surgery, proven by TEE February, 2011  . S/P mitral valve repair     [facility] / Maze procedure/ atrial appendage removed were tied off  . Hx of CABG     At time of mitral valve repair  . Rash     ?Vicodin, Lotensin, Toprol  . Drug therapy     Tikosyn,  stopped 02/10/2012  . QT prolongation     Tikosyn and Effexor. QT prolonged October 13, 2011, peak is in dose reduced from 500  to -250 twice a day  . H/O amiodarone therapy     Satrted 02/12/2012  . PONV (postoperative nausea and vomiting)   . Anginal pain   . Pacemaker     May 17, 2012 for  bradycardia and sick sinus syndrome  . Sick sinus syndrome     s/p Medtronic dual chamber PPM 05/17/12  . High cholesterol   . CHF (congestive heart failure) 2011    Mild in hospital February, 2011  . Asthma   . COPD (chronic obstructive pulmonary disease)   . Shortness of breath     Evaluated by Dr. Craige Cotta. Mat [id number].... bronchodilator response,,  medication started  /  patient much improved June 14, 201  2  . Type II diabetes mellitus   . Arthritis     "not bad; little in my hands; some in my knees" (05/28/2012)  . Acquired complete AV block     AV node ablation May 28, 2012  . Anemia     Hemoglobin 10.4, December, 2013    Past Surgical History  Procedure Laterality Date  . Atrial appendage removed    . Mitral valve repair  2001  . Maze  2001    w/ MVR & CABG  . Cardioversion  02/08/2012    Procedure: CARDIOVERSION;  Surgeon: [name], MD;  Location: [facility] OR;  Service: Cardiovascular;  Laterality: N/A;  . Cardioversion  02/29/2012    Procedure: CARDIOVERSION;  Surgeon: [name], MD;  Location: [facility] OR;  Service: Cardiovascular;  Laterality: N/A;  . Cardioversion  05/15/2012    Procedure: CARDIOVERSION;  Surgeon: [name], MD;  Location: [facility] ENDOSCOPY;  Service: Cardiovascular;  Laterality: N/A;  .  Cardiac catheterization    . Tonsillectomy and adenoidectomy  ~ 1950  . Cholecystectomy  ?1995  . Coronary artery bypass graft  2001    CABG X1  . Breast lumpectomy  1990's    bilaterally  . Pacemaker insertion  05/17/2012    MDT Adapta L implanted by Dr Johney Frame for tachy/brady syndrome  . Abdominal hysterectomy  ?2002  . Av nodal ablation  05/28/12    Current Outpatient Prescriptions  Medication Sig Dispense Refill  . acetaminophen (TYLENOL) 500 MG tablet Take 1,000 mg by mouth every 6 (six) hours as needed. For pain      . albuterol (PROVENTIL HFA;VENTOLIN HFA) 108 (90 BASE) MCG/ACT inhaler Inhale 2 puffs into the lungs every 6 (six) hours as needed. For shortness of breath      . aspirin 81 MG tablet Take 81 mg by mouth daily.        Marland Kitchen atorvastatin (LIPITOR) 40 MG tablet Take 1 tablet (40 mg total) by mouth daily.  30 tablet  1  . calcium carbonate (OS-CAL) 600 MG TABS Take 600 mg by mouth daily.       . carvedilol (COREG) 3.125 MG tablet Take 1 tablet (3.125 mg total) by mouth 2 (two) times daily with a meal.  180 tablet  3  . cholecalciferol (VITAMIN D) 1000 UNITS tablet Take 4,000 Units by mouth daily.      . fish oil-omega-3 fatty acids 1000 MG capsule Take 2 g by mouth 2 (two) times daily.        . furosemide (LASIX) 40 MG tablet Take 0.5 tablets (20 mg total) by mouth daily.  30 tablet  3  . loratadine (CLARITIN) 10 MG tablet Take 10 mg by mouth daily.      . metFORMIN (GLUCOPHAGE) 500 MG tablet Take 500 mg by mouth 2 (two) times daily with a meal.      . montelukast (SINGULAIR) 4 MG PACK Take 4 mg by mouth at bedtime.      . potassium chloride SA (K-DUR,KLOR-CON) 20 MEQ tablet Take 2 tablets (40 mEq total) by mouth daily.  180 tablet  3  . trandolapril (MAVIK) 4 MG tablet Take 8 mg by mouth daily.       Marland Kitchen venlafaxine (EFFEXOR) 37.5 MG tablet Take 37.5 mg by mouth daily.       Marland Kitchen warfarin (COUMADIN) 2.5 MG tablet Take as directed by Anticoagulation clinic  100 tablet  1   No  current facility-administered medications for this  visit.     Allergies  Allergen Reactions  . Demerol (Meperidine) Nausea And Vomiting  . Morphine Other (See Comments)    vomiting  . Tetanus Toxoid Rash    "years ago"     Family History  Problem Relation Age of Onset  . Pancreatic cancer Maternal Grandfather   . Heart disease Father   . Diabetes Father   . Heart disease Sister   . Diabetes Mother   . Kidney disease Mother   . Diabetes Sister   . Diabetes Paternal Grandfather   . Diabetes Paternal Grandmother   . Heart disease Paternal Uncle      History   Social History  . Marital Status: Married    Spouse Name: N/A    Number of Children: 0  . 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    Marland Kitchen     Social History Main Topics  . Smoking status: Never Smoker   . Smokeless tobacco: Never Used  . Alcohol Use: No  . Drug Use: No  . Sexually Active: No   Other Topics Concern  . None   Social History Narrative   Does not et regular exercise   Daily caffeine use           REVIEW OF SYSTEMS - PERTINENT POSITIVES ONLY: 12 point review of systems negative other than HPI and PMH except for shortness of breath, asthma, diabetes, joint pain.  EXAM: Filed Vitals:   11/20/12 1445  BP: 132/80  Pulse: 84  Temp: 97.1 F (36.2 C)  Resp: 16    Gen:  No acute distress.  Well nourished and well groomed.   Neurological: Alert and oriented to person, place, and time. Coordination normal.  Head: Normocephalic and atraumatic.  Eyes: Conjunctivae are normal. Pupils are equal, round, and reactive to light. No scleral icterus.  Neck: Normal range of motion. Neck supple. No tracheal deviation or thyromegaly present.  Cardiovascular: Normal rate, regular rhythm, normal heart sounds and intact distal pulses.  Exam reveals no gallop and no friction rub.  No murmur heard. Respiratory: Effort normal.  No respiratory distress. No chest  wall tenderness. Breath sounds normal.  No wheezes, rales or rhonchi.  GI: Soft. Bowel sounds are normal. The abdomen is soft and nontender.  There is no rebound and no guarding. Rectus diastasis. Musculoskeletal: Normal range of motion. Extremities are nontender.  Lymphadenopathy: No cervical, preauricular, postauricular or axillary adenopathy is present Skin: Skin is warm and dry. No rash noted. No diaphoresis. No erythema. No pallor. No clubbing, cyanosis, or edema.   Psychiatric: Normal mood and affect. Behavior is normal. Judgment and thought content normal.    LABORATORY RESULTS: Available labs are reviewed  CMET OK, CBC OK.  RADIOLOGY RESULTS: See E-Chart or I-Site for most recent results.  Images and reports are reviewed.  CT chest/abd/pelvis IMPRESSION:  1. Cardiac enlargement.  2. No findings for metastatic disease involving the chest. IMPRESSION:  Unremarkable CT abdomen/pelvis.     ASSESSMENT AND PLAN: Cecal cancer Patient with new diagnosis of cecal cancer. There is no evidence of metastatic disease. We are still awaiting her CEA level. She will need a right hemicolectomy. She is on Coumadin and has a history of valvular disease, pulmonary hypertension, and right heart diastolic dysfunction. She sees Dr. Myrtis Ser for this. We will need cardiac clearance and permission to stop her Coumadin.  I reviewed the risks of surgery including  bleeding, infection, damage to adjacent structures, and anastomotic leak, hernia,blood clot, and cardiac complications.  There is a risk of finding metastatic disease or nodal disease which would require chemotherapy.    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       Maudry Diego MD Surgical Oncology, General and Endocrine Surgery [facility]      Visit Diagnoses: 1. Cecal cancer     Primary Care Physician: Nadean Corwin,  MD

## 2012-12-03 NOTE — Transfer of Care (Signed)
Immediate Anesthesia Transfer of Care Note  Patient: Anita Duke  Procedure(s) Performed: Procedure(s): LAPAROSCOPIC RIGHT HEMI COLECTOMY HERNIA REPAIR UMBILICAL ADULT (N/A) DIAGNOSTIC LAPAROSCOPIC LIVER BIOPSY (Left)  Patient Location: PACU  Anesthesia Type:General  Level of Consciousness: awake, alert  and oriented  Airway & Oxygen Therapy: Patient Spontanous Breathing and Patient connected to nasal cannula oxygen  Post-op Assessment: Report given to PACU RN and Post -op Vital signs reviewed and stable  Post vital signs: Reviewed and stable  Complications: No apparent anesthesia complications

## 2012-12-03 NOTE — Progress Notes (Signed)
Rn placed patient on tele box [id number] per MD order. Patient is running 70's and is Ventricular paced

## 2012-12-03 NOTE — Progress Notes (Signed)
Dr Donell Beers aware pt had order for reduced dose PCA Morphine.  Order received to d/c PCA Morphine, and initiate reduced dose Dilauded PCA instead.

## 2012-12-03 NOTE — Anesthesia Preprocedure Evaluation (Addendum)
Anesthesia Evaluation  Patient identified by MRN, date of birth, ID band Patient awake    Reviewed: Allergy & Precautions, H&P , NPO status , Patient's Chart, lab work & pertinent test results  History of Anesthesia Complications (+) PONV and Family history of anesthesia reaction  Airway Mallampati: II TM Distance: >3 FB Neck ROM: full    Dental  (+) Caps, Dental Advisory Given and Teeth Intact,    Pulmonary shortness of breath and with exertion, asthma , COPD         Cardiovascular hypertension, Pt. on medications + angina + CAD, + Past MI, + CABG and +CHF + dysrhythmias Atrial Fibrillation + pacemaker  S/p MVR with CABG   Neuro/Psych PSYCHIATRIC DISORDERS Depression negative neurological ROS     GI/Hepatic negative GI ROS, Neg liver ROS,   Endo/Other  diabetes, Type 2  Renal/GU negative Renal ROS     Musculoskeletal negative musculoskeletal ROS (+)   Abdominal   Peds  Hematology negative hematology ROS (+)   Anesthesia Other Findings   Reproductive/Obstetrics negative OB ROS                         Anesthesia Physical Anesthesia Plan  ASA: IV  Anesthesia Plan: General   Post-op Pain Management:    Induction: Intravenous  Airway Management Planned: Oral ETT  Additional Equipment:   Intra-op Plan:   Post-operative Plan: Extubation in OR  Informed Consent: I have reviewed the patients History and Physical, chart, labs and discussed the procedure including the risks, benefits and alternatives for the proposed anesthesia with the patient or authorized representative who has indicated his/her understanding and acceptance.     Plan Discussed with: CRNA and Surgeon  Anesthesia Plan Comments:         Anesthesia Quick Evaluation

## 2012-12-03 NOTE — Op Note (Signed)
Laparoscopic Right Hemicolectomy, liver biopsy, umbilical hernia repair   Indications: This patient presents for a laparoscopic partial colectomy for cecal cancer  Pre-operative Diagnosis:  Cecal cancer, rectus diastasis, umbilical hernia  Post-operative Diagnosis:  Same  Surgeon: Almond Lint   Assistants: TSUEI, MATTHEW  Anesthesia: General endotracheal anesthesia and Local anesthesia 1% buffered lidocaine, 0.25.% bupivacaine, with epinephrine  ASA Class: 3  Procedure Details  The patient was seen in the Holding Room. The risks, benefits, complications, treatment options, and expected outcomes were discussed with the patient. The possibilities of reaction to medication, perforation of viscus, bleeding, recurrent infection, finding a normal colon, the need for additional procedures, failure to diagnose a condition, and creating a complication requiring transfusion or operation were discussed with the patient. The patient was advised of the risk of ostomy.  The patient concurred with the proposed plan, giving informed consent.   The patient was taken to the operating room, identified, and the procedure verified as partial colectomy. A Time Out was held and the above information confirmed.  The patient was brought to the operating room and placed supine. After induction of a general anesthetic, a Foley catheter was inserted and the abdomen was prepped and draped in standard fashion. The patient was placed into reverse [place] position and rotated to the right. A small incision at the costal margin was made with a #11 blade. Local anesthetic was introduced.  The 5 mm Optiview trocar was placed under direct visualization.  Pneumoperitoneum was insufflated to a pressure of 15 mm Hg. The laparoscope was introduced.    Exploration revealed a normal omentum, colon, small bowel, peritoneum, liver, and stomach. No upper midline ventral hernia was seen.  There was a moderately sized umbilical  hernia.  Two 5 mm left sided 5-mm trocars were then placed after anesthetizing the skin and peritoneum with local anesthetic. A 5 mm trocar was placed in the right abdomen.  The ascending colon and hepatic flexure were then mobilized with gentle retraction of the colon in a medial direction with mobilization of the peritoneal reflection with cautery and the EnSeal.  The omentum was taken off the transverse colon. Mobilization of this area was complete to expose the retroperitoneum.  There was minimal blood loss during this portion of the procedure.      The colon was evaluated to make sure it was adequately mobilized.  A 6 cm vertical incision was made at the umbilicus.  The subcutaneous tissues were divided with the cautery.  The fascia was the cautery as well. The fascial incision was carried out point the skin incision. The protractor was placed and the abdomen to protect the skin. The colon was delivered through the incision.  The colon was resected with a linear stapling device proximal and distal to the area in question in regard to the specimen. The mesenteric vessels were clamped and ligated. The tissues were extremely friable and several areas required suture ligation.The specimen was submitted to pathology.      An side-to-side, functional end-to-end anastomosis was performed through the small anterior incision with the linear stapling device. The mucosa was inspected and found to be hemostatic. Closure was achieved with the linear stapling device. Two 3-0 vicryl suture was used to reapproximate the angle of the anastomosis. Hemostasis was confirmed. The bowel anastomosis was returned.  The abdomen was irrigated.    The fascia was cleaned off from the skin and the hernia/fascial incision was then closed with a running 0 looped PDS sutures. The laparoscope was  re-introduced.  A left sided liver nodule was seen on the surface.  This was removed with the scissors.  The liver was cauterized.  No bleeding  was seen.  The incisions were closed with 3-0 vicryl deep sutures and 4-0 monocryl subcuticular sutures.  Soft dressings were applied.  Instrument, sponge, and needle counts were correct prior to abdominal closure and at the conclusion of the case.   Findings: small cecal cancer, 1.5 cm, soft white liver nodule, clip, tattoo in proximal transverse colon.    Estimated Blood Loss: min         Drains: none  Specimens: right colon, left liver nodule.           Complications: None; patient tolerated the procedure well.         Disposition: PACU - hemodynamically stable.

## 2012-12-03 NOTE — Progress Notes (Signed)
Pacer turned back on per [employer] tech.

## 2012-12-04 ENCOUNTER — Encounter (HOSPITAL_COMMUNITY): Payer: Self-pay | Admitting: General Surgery

## 2012-12-04 LAB — CBC
HCT: 30.3 % — ABNORMAL LOW (ref 36.0–46.0)
Hemoglobin: 9.1 g/dL — ABNORMAL LOW (ref 12.0–15.0)
MCH: 22.7 pg — ABNORMAL LOW (ref 26.0–34.0)
MCV: 75.6 fL — ABNORMAL LOW (ref 78.0–100.0)
RBC: 4.01 MIL/uL (ref 3.87–5.11)

## 2012-12-04 LAB — GLUCOSE, CAPILLARY
Glucose-Capillary: 109 mg/dL — ABNORMAL HIGH (ref 70–99)
Glucose-Capillary: 116 mg/dL — ABNORMAL HIGH (ref 70–99)
Glucose-Capillary: 123 mg/dL — ABNORMAL HIGH (ref 70–99)
Glucose-Capillary: 123 mg/dL — ABNORMAL HIGH (ref 70–99)
Glucose-Capillary: 92 mg/dL (ref 70–99)
Glucose-Capillary: 92 mg/dL (ref 70–99)

## 2012-12-04 LAB — BASIC METABOLIC PANEL WITH GFR
BUN: 7 mg/dL (ref 6–23)
CO2: 30 meq/L (ref 19–32)
Calcium: 8.3 mg/dL — ABNORMAL LOW (ref 8.4–10.5)
Chloride: 104 meq/L (ref 96–112)
Creatinine, Ser: 0.59 mg/dL (ref 0.50–1.10)
GFR calc Af Amer: >90 mL/min
GFR calc non Af Amer: 89 mL/min — ABNORMAL LOW
Glucose, Bld: 120 mg/dL — ABNORMAL HIGH (ref 70–99)
Potassium: 3.2 meq/L — ABNORMAL LOW (ref 3.5–5.1)
Sodium: 140 meq/L (ref 135–145)

## 2012-12-04 MED ORDER — POTASSIUM CHLORIDE 10 MEQ/100ML IV SOLN
INTRAVENOUS | Status: AC
  Administered 2012-12-04: 10 meq
  Filled 2012-12-04: qty 100

## 2012-12-04 MED ORDER — CHLORHEXIDINE GLUCONATE 0.12 % MT SOLN
15.0000 mL | Freq: Two times a day (BID) | OROMUCOSAL | Status: DC
  Administered 2012-12-04 – 2012-12-07 (×6): 15 mL via OROMUCOSAL
  Filled 2012-12-04 (×6): qty 15

## 2012-12-04 MED ORDER — BIOTENE DRY MOUTH MT LIQD
15.0000 mL | Freq: Two times a day (BID) | OROMUCOSAL | Status: DC
  Administered 2012-12-05 – 2012-12-07 (×4): 15 mL via OROMUCOSAL

## 2012-12-04 MED ORDER — POTASSIUM CHLORIDE 10 MEQ/100ML IV SOLN
10.0000 meq | INTRAVENOUS | Status: AC
  Administered 2012-12-04 (×3): 10 meq via INTRAVENOUS
  Filled 2012-12-04 (×4): qty 100

## 2012-12-04 NOTE — Progress Notes (Signed)
1 Day Post-Op  Subjective: Doing OK, not having a lot of pain.  No nausea or vomiting.    Objective: Vital signs in last 24 hours: Temp:  [97.2 F (36.2 C)-98.1 F (36.7 C)] 97.4 F (36.3 C) (04/02 0523) Pulse Rate:  [68-73] 70 (04/02 0523) Resp:  [9-17] 13 (04/02 0837) BP: (118-174)/(46-105) 118/68 mmHg (04/02 0523) SpO2:  [91 %-100 %] 97 % (04/02 0850) Last BM Date: 12/02/12  Intake/Output from previous day: 04/01 0701 - 04/02 0700 In: 2450 [I.V.:2150; IV Piggyback:300] Out: 1145 [Urine:1085; Blood:60] Intake/Output this shift:    General appearance: alert, cooperative and no distress Resp: breathing comfortably Cardio: regular rate and rhythm GI: soft, non distended, approp tender.  dressing c/d/i. Extremities: extremities normal, atraumatic, no cyanosis or edema  Lab Results:   Recent Labs  12/03/12 1614 12/04/12 0540  WBC 14.2* 8.2  HGB 9.5* 9.1*  HCT 31.1* 30.3*  PLT 245 242   BMET  Recent Labs  12/03/12 1614 12/04/12 0540  NA  --  140  K  --  3.2*  CL  --  104  CO2  --  30  GLUCOSE  --  120*  BUN  --  7  CREATININE 0.53 0.59  CALCIUM  --  8.3*   PT/INR  Recent Labs  12/03/12 0738  LABPROT 16.3*  INR 1.34   ABG No results found for this basename: PHART, PCO2, PO2, HCO3,  in the last 72 hours  Studies/Results: No results found.  Anti-infectives: Anti-infectives   Start     Dose/Rate Route Frequency Ordered Stop   12/03/12 1600  cefOXitin (MEFOXIN) 1 g in dextrose 5 % 50 mL IVPB     1 g 100 mL/hr over 30 Minutes Intravenous Every 6 hours 12/03/12 1559 12/04/12 0959   12/03/12 0600  ertapenem (INVANZ) 1 g in sodium chloride 0.9 % 50 mL IVPB     1 g 100 mL/hr over 30 Minutes Intravenous On call to O.R. 12/02/12 1708 12/03/12 0947      Assessment/Plan: s/p Procedure(s): LAPAROSCOPIC RIGHT HEMI COLECTOMY HERNIA REPAIR UMBILICAL ADULT (N/A) DIAGNOSTIC LAPAROSCOPIC LIVER BIOPSY (Left) Advance diet Continue foley due to strict  I&O hope to d/c foley tomorrow as long as UOP adequate and pt mobilizes well Clear liq diet. Tele for history of atrial fibrillation and pacer. Start coumadin tomorrow.   HTN - on home meds, coreg, ace inhibitor.    LOS: 1 day    [facility] 12/04/2012

## 2012-12-05 LAB — BASIC METABOLIC PANEL
GFR calc Af Amer: >90 mL/min (ref >90)
GFR calc non Af Amer: >90 mL/min (ref >90)
Glucose, Bld: 135 mg/dL — ABNORMAL HIGH (ref 70–99)
Potassium: 4.1 mEq/L (ref 3.5–5.1)
Sodium: 138 mEq/L (ref 135–145)

## 2012-12-05 LAB — GLUCOSE, CAPILLARY: Glucose-Capillary: 159 mg/dL — ABNORMAL HIGH (ref 70–99)

## 2012-12-05 LAB — CBC
Hemoglobin: 8.8 g/dL — ABNORMAL LOW (ref 12.0–15.0)
MCHC: 30 g/dL (ref 30.0–36.0)
RDW: 18.5 % — ABNORMAL HIGH (ref 11.5–15.5)

## 2012-12-05 LAB — PROTIME-INR
INR: 1.42 (ref 0.00–1.49)
Prothrombin Time: 17 seconds — ABNORMAL HIGH (ref 11.6–15.2)

## 2012-12-05 MED ORDER — WARFARIN - PHARMACIST DOSING INPATIENT
Freq: Every day | Status: DC
  Administered 2012-12-08: 18:00:00

## 2012-12-05 MED ORDER — WARFARIN SODIUM 2.5 MG PO TABS
2.5000 mg | ORAL_TABLET | Freq: Once | ORAL | Status: AC
  Administered 2012-12-05: 2.5 mg via ORAL
  Filled 2012-12-05: qty 1

## 2012-12-05 NOTE — Progress Notes (Signed)
ANTICOAGULATION CONSULT NOTE - Initial Consult  Pharmacy Consult for Warfarin Indication: atrial fibrillation  Allergies  Allergen Reactions  . Demerol (Meperidine) Nausea And Vomiting  . Morphine Other (See Comments)    vomiting  . Tetanus Toxoid Rash    "years ago"    Patient Measurements: Height: 5\' 3"  (160 cm) Weight: 189 lb 13.1 oz (86.1 kg) IBW/kg (Calculated) : 52.4  Vital Signs: Temp: 97.5 F (36.4 C) (04/03 1351) Temp src: Oral (04/03 1351) BP: 150/68 mmHg (04/03 1351) Pulse Rate: 71 (04/03 1351)  Labs:  Recent Labs  12/03/12 0738  12/03/12 1614 12/04/12 0540 12/05/12 0545 12/05/12 0913  HGB  --   < > 9.5* 9.1* 8.8*  --   HCT  --   --  31.1* 30.3* 29.3*  --   PLT  --   --  245 242 227  --   APTT 33  --   --   --   --   --   LABPROT 16.3*  --   --   --   --  17.0*  INR 1.34  --   --   --   --  1.42  CREATININE  --   --  0.53 0.59 0.52  --   < > = values in this interval not displayed.  Estimated Creatinine Clearance: 66.1 ml/min (by C-G formula based on Cr of 0.52).   Medical History: Past Medical History  Diagnosis Date  . Bradycardia     Post cardioversion February, 2011,/digoxin/Cardizem metoprolol stopped, may need pacemaker  . Mitral regurgitation     Treated with repair, atrial appendage was removed or tied off at surgery.. this was proven by TEE February, 2011  . Pulmonary HTN     55 mmHg, echo, February, 2011 / no mention of pulmonary hypertension echo, June, 2011  . Right ventricular dysfunction     Mild to moderate, echo, February, 2011 / normalized echo, June, 2011  . TR (tricuspid regurgitation)     Moderate, echo, February, 2011 / trivial, echo, June, 2011  . Mitral stenosis     Mild, February, 2011, post mitral valve repair / mild, echo, June, 2011  . CAD (coronary artery disease)     LIMA to the LAD at time of mitral valve repair / LIMA atretic,, February, 2011  . Ejection fraction < 50%     EF 30% echo and TEE diagnosis  February, 2011, possibly rate related / no contraction by catheter, bradycardia so carvedilol cannot be used / EF 55-60% echo, June, 2011 / EF 50%, echo, February, 2012  . Hemorrhoids, internal   . Diverticulosis of colon   . Colon polyp, hyperplastic   . Environmental allergies   . atrial appendage     Removed were tied off at surgery, proven by TEE February, 2011  . Rash     ?Vicodin, Lotensin, Toprol  . Drug therapy     Tikosyn,  stopped 02/10/2012  . H/O amiodarone therapy     Satrted 02/12/2012  . PONV (postoperative nausea and vomiting)   . Anginal pain   . High cholesterol     takes Lipitor daily  . Asthma   . COPD (chronic obstructive pulmonary disease)   . Shortness of breath     Evaluated by Dr. Craige Cotta. Mat [id number].... bronchodilator response,,  medication started  /  patient much improved June 14, 201 2  . HTN (hypertension)     takes Mavik daily  . Myocardial infarction 04/2012  ?  Marland Kitchen  Pacemaker     May 17, 2012 for  bradycardia and sick sinus syndrome  . CHF (congestive heart failure) 2011    Mild in hospital February, 2011;takes Lasix daily  . Cough     thinks may be related to [facility]   . History of bronchitis     many yrs ago  . Seasonal allergies     takes Claritin daily  . Bruises easily     takes Warfarin daily  . Hemorrhoids   . History of colon polyps   . Hypokalemia     takes KDur daily  . Type II diabetes mellitus     takes Metformin daily  . Cataracts, bilateral     immature  . Depression     takes Effexor daily  . Atrial fibrillation     Treated with maze procedure / recurrent atrial fibrillation February, 2011... TEE cardioversion October 19, 2009  . Atrial tachycardia     February, 2011... Tikosyn started in hospital  . QT prolongation     Tikosyn and Effexor. QT prolonged October 13, 2011, peak is in dose reduced from 500  to -250 twice a day  . Sick sinus syndrome     s/p Medtronic dual chamber PPM 05/17/12  . Acquired complete AV block      AV node ablation May 28, 2012  . 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 Family history of anesthesia complication     sister hard to wake up  . Anemia     Hemoglobin 10.4, December, 2013  . GERD (gastroesophageal reflux disease)   . Arthritis     "not bad; little in my hands; some in my knees" (05/28/2012)  . Cecal cancer     Assessment: 72 y.o. F on chronic warfarin for hx Afib. The patient had been holding warfarin PTA for a planned partial colectomy on 4/1 for cecal cancer -- her last dose PTA was on 3/27. At that time the patient was known to be taking 2.5 mg daily. INR today is 1.42. Hgb/Hct/Plt slight drop post-op. No overt s/sx of bleeding noted. The patient is also on SQ heparin for VTE prophylaxis while INR <2. Will plan to trend INR up slowly -- will start with home dose for now.  Goal of Therapy:  INR 2-3   Plan:  1. Warfarin 2.5 mg x 1 dose at 1800 today 2. Daily PT/INR 3. Cont Hep SQ for VTE prophylaxis until INR >/= 2 4. Will continue to monitor for any signs/symptoms of bleeding and will follow up with PT/INR in the a.m.   Georgina Pillion, PharmD, BCPS Clinical Pharmacist Pager: [phone] 12/05/2012 2:00 PM

## 2012-12-05 NOTE — Progress Notes (Signed)
Patient ID: Anita Duke, female   DOB: 05-Mar-1941, 72 y.o.   MRN: [id number] 2 Days Post-Op  Subjective: Having some belching.  Sore, but pain controlled.    Objective: Vital signs in last 24 hours: Temp:  [94.2 F (34.6 C)-99.2 F (37.3 C)] 98.1 F (36.7 C) (04/03 0601) Pulse Rate:  [69-71] 70 (04/03 0601) Resp:  [10-20] 19 (04/03 0838) BP: (116-165)/(47-61) 165/56 mmHg (04/03 0601) SpO2:  [94 %-100 %] 99 % (04/03 0838) Weight:  [189 lb 13.1 oz (86.1 kg)] 189 lb 13.1 oz (86.1 kg) (04/02 2139) Last BM Date: 12/02/12  Intake/Output from previous day: 04/02 0701 - 04/03 0700 In: 883 [P.O.:90; I.V.:793] Out: 800 [Urine:800] Intake/Output this shift:    General appearance: alert, cooperative and no distress Resp: breathing comfortably Cardio: regular rate and rhythm GI: soft, slightly distended, approp tender.  dressing c/d/i. Extremities: extremities normal, atraumatic, no cyanosis or edema  Lab Results:   Recent Labs  12/04/12 0540 12/05/12 0545  WBC 8.2 7.0  HGB 9.1* 8.8*  HCT 30.3* 29.3*  PLT 242 227   BMET  Recent Labs  12/04/12 0540 12/05/12 0545  NA 140 138  K 3.2* 4.1  CL 104 103  CO2 30 29  GLUCOSE 120* 135*  BUN 7 4*  CREATININE 0.59 0.52  CALCIUM 8.3* 8.7   PT/INR  Recent Labs  12/03/12 0738  LABPROT 16.3*  INR 1.34   ABG No results found for this basename: PHART, PCO2, PO2, HCO3,  in the last 72 hours  Studies/Results: No results found.  Anti-infectives: Anti-infectives   Start     Dose/Rate Route Frequency Ordered Stop   12/03/12 1600  cefOXitin (MEFOXIN) 1 g in dextrose 5 % 50 mL IVPB     1 g 100 mL/hr over 30 Minutes Intravenous Every 6 hours 12/03/12 1559 12/04/12 0959   12/03/12 0600  ertapenem (INVANZ) 1 g in sodium chloride 0.9 % 50 mL IVPB     1 g 100 mL/hr over 30 Minutes Intravenous On call to O.R. 12/02/12 1708 12/03/12 0947      Assessment/Plan: s/p Procedure(s): LAPAROSCOPIC RIGHT HEMI COLECTOMY HERNIA REPAIR  UMBILICAL ADULT (N/A) DIAGNOSTIC LAPAROSCOPIC LIVER BIOPSY (Left) Advance diet D/c foley today.  Clear liq diet Tele for history of atrial fibrillation and pacer. Start coumadin today HTN - on home meds, coreg, ace inhibitor.     LOS: 2 days    [facility] 12/05/2012

## 2012-12-06 LAB — GLUCOSE, CAPILLARY
Glucose-Capillary: 116 mg/dL — ABNORMAL HIGH (ref 70–99)
Glucose-Capillary: 134 mg/dL — ABNORMAL HIGH (ref 70–99)
Glucose-Capillary: 146 mg/dL — ABNORMAL HIGH (ref 70–99)
Glucose-Capillary: 170 mg/dL — ABNORMAL HIGH (ref 70–99)

## 2012-12-06 LAB — BASIC METABOLIC PANEL
BUN: 3 mg/dL — ABNORMAL LOW (ref 6–23)
Calcium: 8.9 mg/dL (ref 8.4–10.5)
Chloride: 102 mEq/L (ref 96–112)
Creatinine, Ser: 0.5 mg/dL (ref 0.50–1.10)
GFR calc Af Amer: >90 mL/min (ref >90)
GFR calc non Af Amer: >90 mL/min (ref >90)

## 2012-12-06 LAB — PROTIME-INR: Prothrombin Time: 18.2 seconds — ABNORMAL HIGH (ref 11.6–15.2)

## 2012-12-06 LAB — CBC
HCT: 32.2 % — ABNORMAL LOW (ref 36.0–46.0)
MCH: 22.5 pg — ABNORMAL LOW (ref 26.0–34.0)
MCHC: 29.5 g/dL — ABNORMAL LOW (ref 30.0–36.0)
MCV: 76.3 fL — ABNORMAL LOW (ref 78.0–100.0)
Platelets: 264 10*3/uL (ref 150–400)
RDW: 18.2 % — ABNORMAL HIGH (ref 11.5–15.5)
WBC: 9.5 10*3/uL (ref 4.0–10.5)

## 2012-12-06 MED ORDER — WARFARIN SODIUM 2.5 MG PO TABS
2.5000 mg | ORAL_TABLET | Freq: Once | ORAL | Status: AC
  Administered 2012-12-06: 2.5 mg via ORAL
  Filled 2012-12-06: qty 1

## 2012-12-06 NOTE — Progress Notes (Signed)
ANTICOAGULATION CONSULT NOTE - Initial Consult  Pharmacy Consult for Warfarin Indication: atrial fibrillation  Allergies  Allergen Reactions  . Demerol (Meperidine) Nausea And Vomiting  . Morphine Other (See Comments)    vomiting  . Tetanus Toxoid Rash    "years ago"    Patient Measurements: Height: 5\' 3"  (160 cm) Weight: 189 lb 13.1 oz (86.1 kg) IBW/kg (Calculated) : 52.4  Vital Signs: Temp: 97.7 F (36.5 C) (04/04 1359) Temp src: Oral (04/04 1359) BP: 135/60 mmHg (04/04 0938) Pulse Rate: 71 (04/04 1359)  Labs:  Recent Labs  12/04/12 0540 12/05/12 0545 12/05/12 0913 12/06/12 0555  HGB 9.1* 8.8*  --  9.5*  HCT 30.3* 29.3*  --  32.2*  PLT 242 227  --  264  LABPROT  --   --  17.0* 18.2*  INR  --   --  1.42 1.56*  CREATININE 0.59 0.52  --  0.50    Estimated Creatinine Clearance: 66.1 ml/min (by C-G formula based on Cr of 0.5).   Medical History: Past Medical History  Diagnosis Date  . Bradycardia     Post cardioversion February, 2011,/digoxin/Cardizem metoprolol stopped, may need pacemaker  . Mitral regurgitation     Treated with repair, atrial appendage was removed or tied off at surgery.. this was proven by TEE February, 2011  . Pulmonary HTN     55 mmHg, echo, February, 2011 / no mention of pulmonary hypertension echo, June, 2011  . Right ventricular dysfunction     Mild to moderate, echo, February, 2011 / normalized echo, June, 2011  . TR (tricuspid regurgitation)     Moderate, echo, February, 2011 / trivial, echo, June, 2011  . Mitral stenosis     Mild, February, 2011, post mitral valve repair / mild, echo, June, 2011  . CAD (coronary artery disease)     LIMA to the LAD at time of mitral valve repair / LIMA atretic,, February, 2011  . Ejection fraction < 50%     EF 30% echo and TEE diagnosis February, 2011, possibly rate related / no contraction by catheter, bradycardia so carvedilol cannot be used / EF 55-60% echo, June, 2011 / EF 50%, echo, February,  2012  . Hemorrhoids, internal   . Diverticulosis of colon   . Colon polyp, hyperplastic   . Environmental allergies   . atrial appendage     Removed were tied off at surgery, proven by TEE February, 2011  . Rash     ?Vicodin, Lotensin, Toprol  . Drug therapy     Tikosyn,  stopped 02/10/2012  . H/O amiodarone therapy     Satrted 02/12/2012  . PONV (postoperative nausea and vomiting)   . Anginal pain   . High cholesterol     takes Lipitor daily  . Asthma   . COPD (chronic obstructive pulmonary disease)   . Shortness of breath     Evaluated by Dr. Craige Cotta. Mat [id number].... bronchodilator response,,  medication started  /  patient much improved June 14, 201 2  . HTN (hypertension)     takes Mavik daily  . Myocardial infarction 04/2012  ?  Marland Kitchen Pacemaker     May 17, 2012 for  bradycardia and sick sinus syndrome  . CHF (congestive heart failure) 2011    Mild in hospital February, 2011;takes Lasix daily  . Cough     thinks may be related to [facility]   . History of bronchitis     many yrs ago  . Seasonal allergies  takes Claritin daily  . Bruises easily     takes Warfarin daily  . Hemorrhoids   . History of colon polyps   . Hypokalemia     takes KDur daily  . Type II diabetes mellitus     takes Metformin daily  . Cataracts, bilateral     immature  . Depression     takes Effexor daily  . Atrial fibrillation     Treated with maze procedure / recurrent atrial fibrillation February, 2011... TEE cardioversion October 19, 2009  . Atrial tachycardia     February, 2011... Tikosyn started in hospital  . QT prolongation     Tikosyn and Effexor. QT prolonged October 13, 2011, peak is in dose reduced from 500  to -250 twice a day  . Sick sinus syndrome     s/p [employer]tronic dual chamber PPM 05/17/12  . Acquired complete AV block     AV node ablation May 28, 2012  . 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 Family history of anesthesia complication     sister hard to wake up  .  Anemia     Hemoglobin 10.4, December, 2013  . GERD (gastroesophageal reflux disease)   . Arthritis     "not bad; little in my hands; some in my knees" (05/28/2012)  . Cecal cancer     Assessment: 72 y.o. F on chronic warfarin for hx Afib. The patient had been holding warfarin PTA for a planned partial colectomy on 4/1 for cecal cancer -- her last dose PTA was on 3/27. At that time the patient was known to be taking 2.5 mg daily.   INR today remains SUBtherapeutic though trending up (INR 1.56 << 1.42, goal of 2-3). Hgb/Hct/Plt stable. No overt s/sx of bleeding noted. The patient is also on SQ heparin for VTE prophylaxis while INR <2. Will plan to trend INR up slowly with home dose for now. The patient was re-educated on warfarin today  Goal of Therapy:  INR 2-3   Plan:  1. Warfarin 2.5 mg x 1 dose at 1800 today 2. Daily PT/INR 3. Cont Hep SQ for VTE prophylaxis until INR >/= 2 4. Will continue to monitor for any signs/symptoms of bleeding and will follow up with PT/INR in the a.m.   Georgina Pillion, PharmD, BCPS Clinical Pharmacist Pager: [phone] 12/06/2012 2:02 PM

## 2012-12-06 NOTE — Progress Notes (Signed)
Patient ID: GAE BIHL, female   DOB: [birth year], 72 y.o.   MRN: [id number] 3 Days Post-Op  Subjective: No n/v.  Still with some belching.  No flatus yet.    Objective: Vital signs in last 24 hours: Temp:  [97.5 F (36.4 C)-98.1 F (36.7 C)] 98 F (36.7 C) (04/04 0547) Pulse Rate:  [68-71] 69 (04/04 0547) Resp:  [14-22] 18 (04/04 0748) BP: (130-165)/(57-68) 165/65 mmHg (04/04 0547) SpO2:  [97 %-100 %] 97 % (04/04 0748) Last BM Date: 12/02/12  Intake/Output from previous day: 04/03 0701 - 04/04 0700 In: 2280 [P.O.:480; I.V.:1800] Out: 2925 [Urine:2925] Intake/Output this shift: Total I/O In: -  Out: 400 [Urine:400]  General appearance: alert, cooperative and no distress Resp: breathing comfortably Cardio: regular rate and rhythm GI: soft, slightly distended, approp tender.  dressing c/d/i. Extremities: extremities normal, atraumatic, no cyanosis or edema  Lab Results:   Recent Labs  12/05/12 0545 12/06/12 0555  WBC 7.0 9.5  HGB 8.8* 9.5*  HCT 29.3* 32.2*  PLT 227 264   BMET  Recent Labs  12/05/12 0545 12/06/12 0555  NA 138 138  K 4.1 4.6  CL 103 102  CO2 29 33*  GLUCOSE 135* 132*  BUN 4* 3*  CREATININE 0.52 0.50  CALCIUM 8.7 8.9   PT/INR  Recent Labs  12/05/12 0913 12/06/12 0555  LABPROT 17.0* 18.2*  INR 1.42 1.56*   ABG No results found for this basename: PHART, PCO2, PO2, HCO3,  in the last 72 hours  Studies/Results: No results found.  Anti-infectives: Anti-infectives   Start     Dose/Rate Route Frequency Ordered Stop   12/03/12 1600  cefOXitin (MEFOXIN) 1 g in dextrose 5 % 50 mL IVPB     1 g 100 mL/hr over 30 Minutes Intravenous Every 6 hours 12/03/12 1559 12/04/12 0959   12/03/12 0600  ertapenem (INVANZ) 1 g in sodium chloride 0.9 % 50 mL IVPB     1 g 100 mL/hr over 30 Minutes Intravenous On call to O.R. 12/02/12 1708 12/03/12 0947      Assessment/Plan: s/p Procedure(s): LAPAROSCOPIC RIGHT HEMI COLECTOMY HERNIA REPAIR  UMBILICAL ADULT (N/A) DIAGNOSTIC LAPAROSCOPIC LIVER BIOPSY (Left)  D/c foley  Decrease IVF.  Clear liq diet Tele for history of atrial fibrillation and pacer. Started coumadin yesterday for atrial fibrillation. HTN - on home meds, coreg, ace inhibitor.  Path reviewed with pt and husband, [id number]     LOS: 3 days    [facility] 12/06/2012

## 2012-12-07 LAB — BASIC METABOLIC PANEL
BUN: 4 mg/dL — ABNORMAL LOW (ref 6–23)
Creatinine, Ser: 0.46 mg/dL — ABNORMAL LOW (ref 0.50–1.10)
GFR calc Af Amer: >90 mL/min (ref >90)
GFR calc non Af Amer: >90 mL/min (ref >90)
Glucose, Bld: 130 mg/dL — ABNORMAL HIGH (ref 70–99)
Potassium: 4.3 mEq/L (ref 3.5–5.1)

## 2012-12-07 LAB — GLUCOSE, CAPILLARY
Glucose-Capillary: 126 mg/dL — ABNORMAL HIGH (ref 70–99)
Glucose-Capillary: 129 mg/dL — ABNORMAL HIGH (ref 70–99)
Glucose-Capillary: 152 mg/dL — ABNORMAL HIGH (ref 70–99)

## 2012-12-07 LAB — CBC
HCT: 33.9 % — ABNORMAL LOW (ref 36.0–46.0)
Hemoglobin: 10.2 g/dL — ABNORMAL LOW (ref 12.0–15.0)
MCHC: 30.1 g/dL (ref 30.0–36.0)
MCV: 75.8 fL — ABNORMAL LOW (ref 78.0–100.0)
RDW: 18.5 % — ABNORMAL HIGH (ref 11.5–15.5)

## 2012-12-07 MED ORDER — OXYCODONE HCL 5 MG PO TABS
5.0000 mg | ORAL_TABLET | ORAL | Status: DC | PRN
  Administered 2012-12-07: 10 mg via ORAL
  Administered 2012-12-07 – 2012-12-08 (×3): 5 mg via ORAL
  Filled 2012-12-07 (×2): qty 1
  Filled 2012-12-07: qty 2
  Filled 2012-12-07: qty 1

## 2012-12-07 MED ORDER — WARFARIN SODIUM 2.5 MG PO TABS
2.5000 mg | ORAL_TABLET | Freq: Once | ORAL | Status: AC
  Administered 2012-12-07: 2.5 mg via ORAL
  Filled 2012-12-07: qty 1

## 2012-12-07 NOTE — Progress Notes (Signed)
Patient in overall good spirits; up in chair associating with visitors; ambulated in hallway twice today; no signs or symptoms of distress noted.

## 2012-12-07 NOTE — Progress Notes (Signed)
4 Days Post-Op  Subjective: doing, well had BM  Objective: Vital signs in last 24 hours: Temp:  [97.7 F (36.5 C)-98.2 F (36.8 C)] 97.8 F (36.6 C) (04/05 0623) Pulse Rate:  [69-72] 69 (04/05 0623) Resp:  [13-20] 14 (04/05 0906) BP: (130-165)/(60-73) 131/69 mmHg (04/05 0623) SpO2:  [97 %-100 %] 100 % (04/05 0918) Last BM Date: 12/02/12  Intake/Output from previous day: 04/04 0701 - 04/05 0700 In: 1226.7 [P.O.:120; I.V.:1106.7] Out: 2550 [Urine:2550] Intake/Output this shift:    General appearance: alert and cooperative Resp: clear to auscultation bilaterally Cardio: irregularly irregular rhythm GI: soft, ND, incisions CDI, +BS Ext: claves soft  Lab Results:   Recent Labs  12/06/12 0555 12/07/12 0635  WBC 9.5 8.2  HGB 9.5* 10.2*  HCT 32.2* 33.9*  PLT 264 272   BMET  Recent Labs  12/06/12 0555 12/07/12 0635  NA 138 136  K 4.6 4.3  CL 102 100  CO2 33* 30  GLUCOSE 132* 130*  BUN 3* 4*  CREATININE 0.50 0.46*  CALCIUM 8.9 9.3   PT/INR  Recent Labs  12/06/12 0555 12/07/12 0635  LABPROT 18.2* 19.5*  INR 1.56* 1.71*   ABG No results found for this basename: PHART, PCO2, PO2, HCO3,  in the last 72 hours  Studies/Results: No results found.  Anti-infectives: Anti-infectives   Start     Dose/Rate Route Frequency Ordered Stop   12/03/12 1600  cefOXitin (MEFOXIN) 1 g in dextrose 5 % 50 mL IVPB     1 g 100 mL/hr over 30 Minutes Intravenous Every 6 hours 12/03/12 1559 12/04/12 0959   12/03/12 0600  ertapenem (INVANZ) 1 g in sodium chloride 0.9 % 50 mL IVPB     1 g 100 mL/hr over 30 Minutes Intravenous On call to O.R. 12/02/12 1708 12/03/12 0947      Assessment/Plan: s/p Procedure(s): LAPAROSCOPIC RIGHT HEMI COLECTOMY HERNIA REPAIR UMBILICAL ADULT (N/A) DIAGNOSTIC LAPAROSCOPIC LIVER BIOPSY (Left) FEN - full liquids now, reg diet in AM, add oral pain meds VTE - SQ heparin + coumadin Anemia - better Patient requests Dr. Darnelle Catalan for medical  oncology F/U after D/C  LOS: 4 days    Anita Duke E 12/07/2012

## 2012-12-07 NOTE — Progress Notes (Signed)
ANTICOAGULATION CONSULT NOTE - Initial Consult  Pharmacy Consult for Warfarin Indication: atrial fibrillation  Allergies  Allergen Reactions  . Demerol (Meperidine) Nausea And Vomiting  . Morphine Other (See Comments)    vomiting  . Tetanus Toxoid Rash    "years ago"    Patient Measurements: Height: 5\' 3"  (160 cm) Weight: 189 lb 13.1 oz (86.1 kg) IBW/kg (Calculated) : 52.4  Vital Signs: Temp: 97.8 F (36.6 C) (04/05 0623) Temp src: Oral (04/05 0623) BP: 131/69 mmHg (04/05 0623) Pulse Rate: 69 (04/05 0623)  Labs:  Recent Labs  12/05/12 0545 12/05/12 0913 12/06/12 0555 12/07/12 0635  HGB 8.8*  --  9.5* 10.2*  HCT 29.3*  --  32.2* 33.9*  PLT 227  --  264 272  LABPROT  --  17.0* 18.2* 19.5*  INR  --  1.42 1.56* 1.71*  CREATININE 0.52  --  0.50 0.46*    Estimated Creatinine Clearance: 66.1 ml/min (by C-G formula based on Cr of 0.46).   Medical History: Past Medical History  Diagnosis Date  . Bradycardia     Post cardioversion February, 2011,/digoxin/Cardizem metoprolol stopped, may need pacemaker  . Mitral regurgitation     Treated with repair, atrial appendage was removed or tied off at surgery.. this was proven by TEE February, 2011  . Pulmonary HTN     55 mmHg, echo, February, 2011 / no mention of pulmonary hypertension echo, June, 2011  . Right ventricular dysfunction     Mild to moderate, echo, February, 2011 / normalized echo, June, 2011  . TR (tricuspid regurgitation)     Moderate, echo, February, 2011 / trivial, echo, June, 2011  . Mitral stenosis     Mild, February, 2011, post mitral valve repair / mild, echo, June, 2011  . CAD (coronary artery disease)     LIMA to the LAD at time of mitral valve repair / LIMA atretic,, February, 2011  . Ejection fraction < 50%     EF 30% echo and TEE diagnosis February, 2011, possibly rate related / no contraction by catheter, bradycardia so carvedilol cannot be used / EF 55-60% echo, June, 2011 / EF 50%, echo,  February, 2012  . Hemorrhoids, internal   . Diverticulosis of colon   . Colon polyp, hyperplastic   . Environmental allergies   . atrial appendage     Removed were tied off at surgery, proven by TEE February, 2011  . Rash     ?Vicodin, Lotensin, Toprol  . Drug therapy     Tikosyn,  stopped 02/10/2012  . H/O amiodarone therapy     Satrted 02/12/2012  . PONV (postoperative nausea and vomiting)   . Anginal pain   . High cholesterol     takes Lipitor daily  . Asthma   . COPD (chronic obstructive pulmonary disease)   . Shortness of breath     Evaluated by Dr. Craige Cotta. Mat [id number].... bronchodilator response,,  medication started  /  patient much improved June 14, 201 2  . HTN (hypertension)     takes Mavik daily  . Myocardial infarction 04/2012  ?  Marland Kitchen Pacemaker     May 17, 2012 for  bradycardia and sick sinus syndrome  . CHF (congestive heart failure) 2011    Mild in hospital February, 2011;takes Lasix daily  . Cough     thinks may be related to [facility]   . History of bronchitis     many yrs ago  . Seasonal allergies     takes  Claritin daily  . Bruises easily     takes Warfarin daily  . Hemorrhoids   . History of colon polyps   . Hypokalemia     takes KDur daily  . Type II diabetes mellitus     takes Metformin daily  . Cataracts, bilateral     immature  . Depression     takes Effexor daily  . Atrial fibrillation     Treated with maze procedure / recurrent atrial fibrillation February, 2011... TEE cardioversion October 19, 2009  . Atrial tachycardia     February, 2011... Tikosyn started in hospital  . QT prolongation     Tikosyn and Effexor. QT prolonged October 13, 2011, peak is in dose reduced from 500  to -250 twice a day  . Sick sinus syndrome     s/p Medtronic dual chamber PPM 05/17/12  . Acquired complete AV block     AV node ablation May 28, 2012  . 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 Family history of anesthesia complication     sister hard to  wake up  . Anemia     Hemoglobin 10.4, December, 2013  . GERD (gastroesophageal reflux disease)   . Arthritis     "not bad; little in my hands; some in my knees" (05/28/2012)  . Cecal cancer     Assessment: 72 y.o. F on chronic warfarin for hx Afib. The patient had been holding warfarin PTA for a planned partial colectomy on 4/1 for cecal cancer -- her last dose PTA was on 3/27. At that time the patient was known to be taking 2.5 mg daily.   INR today remains SUBtherapeutic though trending up nicely (INR 1.71 << 1.56, goal of 2-3). Hgb/Hct/Plt trending up. No overt s/sx of bleeding noted. The patient is also on SQ heparin for VTE prophylaxis while INR <2. Will plan to trend INR up slowly with home dose for now. The patient was re-educated on warfarin this admission  Goal of Therapy:  INR 2-3   Plan:  1. Warfarin 2.5 mg x 1 dose at 1800 today 2. Daily PT/INR 3. Cont Hep SQ for VTE prophylaxis until INR >/= 2 4. Will continue to monitor for any signs/symptoms of bleeding and will follow up with PT/INR in the a.m.   Georgina Pillion, PharmD, BCPS Clinical Pharmacist Pager: [phone] 12/07/2012 10:41 AM

## 2012-12-08 LAB — GLUCOSE, CAPILLARY
Glucose-Capillary: 109 mg/dL — ABNORMAL HIGH (ref 70–99)
Glucose-Capillary: 118 mg/dL — ABNORMAL HIGH (ref 70–99)
Glucose-Capillary: 112 mg/dL — ABNORMAL HIGH (ref 70–99)

## 2012-12-08 MED ORDER — WARFARIN SODIUM 5 MG PO TABS: 5.0000 mg | ORAL_TABLET | Freq: Every day | ORAL | Status: DC

## 2012-12-08 MED ORDER — BISACODYL 5 MG PO TBEC
5.0000 mg | DELAYED_RELEASE_TABLET | Freq: Every day | ORAL | Status: DC | PRN
  Administered 2012-12-08: 5 mg via ORAL
  Filled 2012-12-08 (×2): qty 1

## 2012-12-08 MED ORDER — ENOXAPARIN SODIUM 30 MG/0.3ML ~~LOC~~ SOLN: 40.0000 mg | Freq: Two times a day (BID) | SUBCUTANEOUS | Status: DC

## 2012-12-08 MED ORDER — OXYCODONE HCL 5 MG PO TABS: 5.0000 mg | ORAL_TABLET | ORAL | Status: DC | PRN

## 2012-12-08 MED ORDER — BISACODYL 10 MG RE SUPP
10.0000 mg | Freq: Once | RECTAL | Status: AC
  Administered 2012-12-08: 10 mg via RECTAL
  Filled 2012-12-08: qty 1

## 2012-12-08 MED ORDER — WARFARIN SODIUM 5 MG PO TABS
5.0000 mg | ORAL_TABLET | Freq: Once | ORAL | Status: AC
  Administered 2012-12-08: 5 mg via ORAL
  Filled 2012-12-08: qty 1

## 2012-12-08 MED ORDER — DOCUSATE SODIUM 100 MG PO CAPS
100.0000 mg | ORAL_CAPSULE | Freq: Two times a day (BID) | ORAL | Status: DC | PRN
  Administered 2012-12-08: 100 mg via ORAL
  Filled 2012-12-08: qty 1

## 2012-12-08 NOTE — Progress Notes (Signed)
Pt had a large bowel movement.  It was semi-solid, dark brown color.  Pt feels great! She has tolerated all solid foods today without any pain or nausea. Ready for discharge per order.

## 2012-12-08 NOTE — Discharge Summary (Signed)
Physician Discharge Summary  Patient ID: Anita Duke MRN: [id number] DOB/AGE: 73-Aug-1942 73 y.o.  Admit date: 12/03/2012 Discharge date: 12/08/2012  Admitting Diagnosis: Cecal cancer, rectus diastasis, umbilical hernia  Discharge Diagnosis Patient Active Problem List   Diagnosis Date Noted  . 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 Special screening for malignant neoplasms, colon 10/31/2012  . Anemia   . Acquired complete AV block   . Sick sinus syndrome   . Pacemaker-Medtronic 05/20/2012  . Allergic rhinitis 04/25/2012  . H/O amiodarone therapy   . Drug therapy   . QT prolongation   . Mild intermittent asthma 02/16/2011  . Shortness of breath   . Ejection fraction < 50%   . Atrial fibrillation   . Atrial tachycardia   . Bradycardia   . Mitral regurgitation   . Pulmonary HTN   . Right ventricular dysfunction   . TR (tricuspid regurgitation)   . Mitral stenosis   . CAD (coronary artery disease)   . Dizziness   . Warfarin anticoagulation   . atrial appendage   . S/P mitral valve repair   . Hx of CABG   . Rash   . DIABETES MELLITUS 03/02/2009  . HYPERLIPIDEMIA 07/07/2008  . HYPERTENSION 07/07/2008  . INTERNAL HEMORRHOIDS 07/07/2008  . COLONIC POLYPS, HYPERPLASTIC 08/04/2002  . DIVERTICULOSIS, COLON 08/04/2002    Consultants None  Imaging: No results found.  Procedures Dr. Donell Beers (12/03/12) Laparoscopic right hemicolectomy, umbilical hernia repair, and diagnostic laparoscopic liver biopsy left  Hospital Course:  72 year old female with a new diagnosis of cecal cancer. She had a screening colonoscopy and was found to have a polyp. This was felt to appear benign, but final pathology demonstrated adenocarcinoma. She did not have any rectal bleeding. She denies any weight loss. She has not had constipation or diarrhea. She does not have any abdominal pain. She has no family history of colon cancer. She does have shortness of breath, and is seeing Dr. Myrtis Ser for cardiac disease. She  presented for an elective surgery.  Patient was admitted and underwent procedure listed above.  Tolerated procedure well and was transferred to the floor.  Her surgical path revealed:   - RIGHT COLON - INVASIVE MODERATELY TO POORLY DIFFERENTIATED ADENOCARCINOMA, SPANNING 2.2 CM IN GREATEST DIMENSION. - TUMOR INVADES INTO BUT NOT THROUGH MUSCULARIS PROPRIA. - LYMPH/VASCULAR INVASION IS IDENTIFIED. - TWELVE BENIGN LYMPH NODES WITH NO TUMOR SEEN (0/12). - MARGINS ARE NEGATIVE. - BENIGN HYALINIZED LIVER NODULE.  She experienced a post op ileus.  Diet was advanced as tolerated.  On POD #5, the patient was voiding well, tolerating diet, ambulating well, pain well controlled, vital signs stable, incisions c/d/i and felt stable for discharge home.  Patient will follow up in our office in 2 weeks with Dr. Donell Beers and knows to call with questions or concerns.  She should also f/u with her oncologist.  She will go home with 4 doses of lovenox injections which will start tonight and she will be given one 5mg  dose of coumadin tonight then return to her normal at home dosage of 2.5mg .    Physical Exam: General:  Alert, NAD, pleasant, comfortable Abd:  Soft, ND, mild tenderness, incisions C/D/I    Medication List    TAKE these medications       acetaminophen 500 MG tablet  Commonly known as:  TYLENOL  Take 1,000 mg by mouth every 6 (six) hours as needed. For pain     aspirin 81 MG tablet  Take 81 mg by mouth daily.  atorvastatin 40 MG tablet  Commonly known as:  LIPITOR  Take 1 tablet (40 mg total) by mouth daily.     budesonide-formoterol 160-4.5 MCG/ACT inhaler  Commonly known as:  SYMBICORT  Inhale 2 puffs into the lungs 2 (two) times daily.     calcium carbonate 600 MG Tabs  Commonly known as:  OS-CAL  Take 600 mg by mouth daily.     carvedilol 3.125 MG tablet  Commonly known as:  COREG  Take 1 tablet (3.125 mg total) by mouth 2 (two) times daily with a meal.     cholecalciferol  1000 UNITS tablet  Commonly known as:  VITAMIN D  Take 4,000 Units by mouth daily.     enoxaparin 30 MG/0.3ML injection  Commonly known as:  LOVENOX  Inject 0.4 mLs (40 mg total) into the skin every 12 (twelve) hours.     fish oil-omega-3 fatty acids 1000 MG capsule  Take 1 g by mouth 2 (two) times daily.     furosemide 40 MG tablet  Commonly known as:  LASIX  Take 0.5 tablets (20 mg total) by mouth daily.     loratadine 10 MG tablet  Commonly known as:  CLARITIN  Take 10 mg by mouth daily.     metFORMIN 500 MG tablet  Commonly known as:  GLUCOPHAGE  Take 500 mg by mouth daily.     oxyCODONE 5 MG immediate release tablet  Commonly known as:  Oxy IR/ROXICODONE  Take 1-2 tablets (5-10 mg total) by mouth every 4 (four) hours as needed (pain).     potassium chloride SA 20 MEQ tablet  Commonly known as:  K-DUR,KLOR-CON  Take 2 tablets (40 mEq total) by mouth daily.     trandolapril 4 MG tablet  Commonly known as:  MAVIK  Take 8 mg by mouth daily.     venlafaxine 37.5 MG tablet  Commonly known as:  EFFEXOR  Take 37.5 mg by mouth daily.     warfarin 5 MG tablet  Commonly known as:  COUMADIN  Take 1 tablet (5 mg total) by mouth daily.     warfarin 2.5 MG tablet  Commonly known as:  COUMADIN  Take 2.5 mg by mouth daily.             Follow-up Information   Follow up with [facility], [place]. Schedule an appointment as soon as possible for a visit in 2 weeks.   Contact information:   [address] [place] [address] [phone]       Follow up with Lowella Dell, MD. Schedule an appointment as soon as possible for a visit in 2 weeks.   Contact information:   [address] [place] [address] [phone]       Signed: Aris Georgia [facility] [phone]  12/08/2012, 8:05 AM

## 2012-12-08 NOTE — Discharge Instructions (Signed)
[facility]      [facility] [phone]  OPEN ABDOMINAL SURGERY: POST OP INSTRUCTIONS  Always review your discharge instruction sheet given to you by the facility where your surgery was performed.  IF YOU HAVE DISABILITY OR FAMILY LEAVE FORMS, YOU MUST BRING THEM TO THE OFFICE FOR PROCESSING.  PLEASE DO NOT GIVE THEM TO YOUR DOCTOR.  1. A prescription for pain medication may be given to you upon discharge.  Take your pain medication as prescribed, if needed.  If narcotic pain medicine is not needed, then you may take acetaminophen (Tylenol) or ibuprofen (Advil) as needed. 2. Take your usually prescribed medications unless otherwise directed. 3. If you need a refill on your pain medication, please contact your pharmacy. They will contact our office to request authorization.  Prescriptions will not be filled after 5pm or on week-ends. 4. You should follow a light diet the first few days after arrival home, such as soup and crackers, pudding, etc.unless your doctor has advised otherwise. A high-fiber, low fat diet can be resumed as tolerated.   Be sure to include lots of fluids daily. Most patients will experience some swelling and bruising on the chest and neck area.  Ice packs will help.  Swelling and bruising can take several days to resolve 5. Most patients will experience some swelling and bruising in the area of the incision. Ice pack will help. Swelling and bruising can take several days to resolve..  6. It is common to experience some constipation if taking pain medication after surgery.  Increasing fluid intake and taking a stool softener will usually help or prevent this problem from occurring.  A mild laxative (Milk of Magnesia or Miralax) should be taken according to package directions if there are no bowel movements after 48 hours. 7.  You may have steri-strips (small skin tapes) in place directly over the incision.  These strips should be left on the skin for 7-10 days.  If your  surgeon used skin glue on the incision, you may shower in 24 hours.  The glue will flake off over the next 2-3 weeks.  Any sutures or staples will be removed at the office during your follow-up visit. You may find that a light gauze bandage over your incision may keep your staples from being rubbed or pulled. You may shower and replace the bandage daily. 8. ACTIVITIES:  You may resume regular (light) daily activities beginning the next day--such as daily self-care, walking, climbing stairs--gradually increasing activities as tolerated.  You may have sexual intercourse when it is comfortable.  Refrain from any heavy lifting or straining until approved by your doctor. a. You may drive when you no longer are taking prescription pain medication, you can comfortably wear a seatbelt, and you can safely maneuver your car and apply brakes b. Return to Work: ___________________________________ 9. You should see your doctor in the office for a follow-up appointment approximately two weeks after your surgery.  Make sure that you call for this appointment within a day or two after you arrive home to insure a convenient appointment time. OTHER INSTRUCTIONS:  _____________________________________________________________ _____________________________________________________________  WHEN TO CALL YOUR DOCTOR: 1. Fever over 101.0 2. Inability to urinate 3. Nausea and/or vomiting 4. Extreme swelling or bruising 5. Continued bleeding from incision. 6. Increased pain, redness, or drainage from the incision. 7. Difficulty swallowing or breathing 8. Muscle cramping or spasms. 9. Numbness or tingling in hands or feet or around lips.  The clinic staff is available to   answer your questions during regular business hours.  Please don't hesitate to call and ask to speak to one of the nurses if you have concerns.  For further questions, please visit [email or web address]   

## 2012-12-08 NOTE — Discharge Summary (Signed)
Agree with above.  Discharge today with follow-up arranged.  Wilmon Arms. Corliss Skains, MD, [facility]  12/08/2012 9:32 AM

## 2012-12-08 NOTE — Progress Notes (Signed)
Pt and husband given discharge instructions and prescriptions.  They verbalized understanding of all instructions and follow-up appointments.  Pt has been on Lovenox injections in the past, so no instruction was needed on giving those injections. VSS. Pain minimal.  Pt taken down for discharge with husband via wheelchair.

## 2012-12-08 NOTE — Progress Notes (Signed)
ANTICOAGULATION CONSULT NOTE - Initial Consult  Pharmacy Consult for Warfarin Indication: atrial fibrillation  Allergies  Allergen Reactions  . Demerol (Meperidine) Nausea And Vomiting  . Morphine Other (See Comments)    vomiting  . Tetanus Toxoid Rash    "years ago"    Patient Measurements: Height: 5\' 3"  (160 cm) Weight: 189 lb 13.1 oz (86.1 kg) IBW/kg (Calculated) : 52.4  Vital Signs: Temp: 98.6 F (37 C) (04/06 0555) BP: 143/70 mmHg (04/06 0555) Pulse Rate: 75 (04/06 0555)  Labs:  Recent Labs  12/06/12 0555 12/07/12 0635 12/08/12 0622  HGB 9.5* 10.2*  --   HCT 32.2* 33.9*  --   PLT 264 272  --   LABPROT 18.2* 19.5* 19.9*  INR 1.56* 1.71* 1.76*  CREATININE 0.50 0.46*  --     Estimated Creatinine Clearance: 66.1 ml/min (by C-G formula based on Cr of 0.46).   Medical History: Past Medical History  Diagnosis Date  . Bradycardia     Post cardioversion February, 2011,/digoxin/Cardizem metoprolol stopped, may need pacemaker  . Mitral regurgitation     Treated with repair, atrial appendage was removed or tied off at surgery.. this was proven by TEE February, 2011  . Pulmonary HTN     55 mmHg, echo, February, 2011 / no mention of pulmonary hypertension echo, June, 2011  . Right ventricular dysfunction     Mild to moderate, echo, February, 2011 / normalized echo, June, 2011  . TR (tricuspid regurgitation)     Moderate, echo, February, 2011 / trivial, echo, June, 2011  . Mitral stenosis     Mild, February, 2011, post mitral valve repair / mild, echo, June, 2011  . CAD (coronary artery disease)     LIMA to the LAD at time of mitral valve repair / LIMA atretic,, February, 2011  . Ejection fraction < 50%     EF 30% echo and TEE diagnosis February, 2011, possibly rate related / no contraction by catheter, bradycardia so carvedilol cannot be used / EF 55-60% echo, June, 2011 / EF 50%, echo, February, 2012  . Hemorrhoids, internal   . Diverticulosis of colon   .  Colon polyp, hyperplastic   . Environmental allergies   . atrial appendage     Removed were tied off at surgery, proven by TEE February, 2011  . Rash     ?Vicodin, Lotensin, Toprol  . Drug therapy     Tikosyn,  stopped 02/10/2012  . H/O amiodarone therapy     Satrted 02/12/2012  . PONV (postoperative nausea and vomiting)   . Anginal pain   . High cholesterol     takes Lipitor daily  . Asthma   . COPD (chronic obstructive pulmonary disease)   . Shortness of breath     Evaluated by Dr. Craige Cotta. Mat [id number].... bronchodilator response,,  medication started  /  patient much improved June 14, 201 2  . HTN (hypertension)     takes Mavik daily  . Myocardial infarction 04/2012  ?  Marland Kitchen Pacemaker     May 17, 2012 for  bradycardia and sick sinus syndrome  . CHF (congestive heart failure) 2011    Mild in hospital February, 2011;takes Lasix daily  . Cough     thinks may be related to [facility]   . History of bronchitis     many yrs ago  . Seasonal allergies     takes Claritin daily  . Bruises easily     takes Warfarin daily  . Hemorrhoids   .  History of colon polyps   . Hypokalemia     takes KDur daily  . Type II diabetes mellitus     takes Metformin daily  . Cataracts, bilateral     immature  . Depression     takes Effexor daily  . Atrial fibrillation     Treated with maze procedure / recurrent atrial fibrillation February, 2011... TEE cardioversion October 19, 2009  . Atrial tachycardia     February, 2011... Tikosyn started in hospital  . QT prolongation     Tikosyn and Effexor. QT prolonged October 13, 2011, peak is in dose reduced from 500  to -250 twice a day  . Sick sinus syndrome     s/p Medtronic dual chamber PPM 05/17/12  . Acquired complete AV block     AV node ablation May 28, 2012  . 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 Family history of anesthesia complication     sister hard to wake up  . Anemia     Hemoglobin 10.4, December, 2013  . GERD  (gastroesophageal reflux disease)   . Arthritis     "not bad; little in my hands; some in my knees" (05/28/2012)  . Cecal cancer     Assessment: 72 y.o. F on chronic warfarin for hx Afib. The patient had been holding warfarin PTA for a planned partial colectomy on 4/1 for cecal cancer -- her last dose PTA was on 3/27. At that time the patient was known to be taking 2.5 mg daily.   INR today remains SUBtherapeutic (INR 1.76 << 1.71, goal of 2-3). Hgb/Hct/Plt trending up. No overt s/sx of bleeding noted. The patient is also on SQ heparin for VTE prophylaxis while INR <2.   This patient was discussed with surgery PA. If discharged home today -- would recommend 5 mg tonight, then to continue home dose of 2.5 mg daily until followed up at the coumadin clinic.  Goal of Therapy:  INR 2-3   Plan:  1. Warfarin 5 mg x 1 dose at 1800 today (if patient still here) 2. Daily PT/INR 3. Cont Hep SQ for VTE prophylaxis until INR >/= 2 4. Will continue to monitor for any signs/symptoms of bleeding and will follow up with PT/INR in the a.m.   Georgina Pillion, PharmD, BCPS Clinical Pharmacist Pager: [phone] 12/08/2012 1:32 PM

## 2012-12-09 ENCOUNTER — Other Ambulatory Visit: Payer: Self-pay | Admitting: Oncology

## 2012-12-09 ENCOUNTER — Other Ambulatory Visit (INDEPENDENT_AMBULATORY_CARE_PROVIDER_SITE_OTHER): Payer: Self-pay | Admitting: General Surgery

## 2012-12-09 ENCOUNTER — Telehealth (INDEPENDENT_AMBULATORY_CARE_PROVIDER_SITE_OTHER): Payer: Self-pay | Admitting: General Surgery

## 2012-12-09 DIAGNOSIS — C180 Malignant neoplasm of cecum: Secondary | ICD-10-CM

## 2012-12-09 NOTE — Telephone Encounter (Signed)
Per Selena Batten she is checking to see if Dr Darnelle Catalan will see this patient . This is a personal request for the patient .

## 2012-12-12 ENCOUNTER — Telehealth: Payer: Self-pay | Admitting: Oncology

## 2012-12-12 NOTE — Telephone Encounter (Signed)
S/w pt in re np appt 04/28 @ 2:30 w/Dr. Darnelle Catalan Referring Dr. Donell Beers `Dx- Cecal Cancer Welcome packet mailed.

## 2012-12-13 ENCOUNTER — Telehealth: Payer: Self-pay | Admitting: Oncology

## 2012-12-13 ENCOUNTER — Ambulatory Visit (INDEPENDENT_AMBULATORY_CARE_PROVIDER_SITE_OTHER): Payer: BC Managed Care – PPO | Admitting: Pharmacist

## 2012-12-13 DIAGNOSIS — I4891 Unspecified atrial fibrillation: Secondary | ICD-10-CM

## 2012-12-13 DIAGNOSIS — Z8679 Personal history of other diseases of the circulatory system: Secondary | ICD-10-CM

## 2012-12-13 DIAGNOSIS — Z7901 Long term (current) use of anticoagulants: Secondary | ICD-10-CM

## 2012-12-13 LAB — POCT INR: INR: 2.4

## 2012-12-13 NOTE — Telephone Encounter (Signed)
C/D 12/13/12 for appt. 12/30/12

## 2012-12-16 ENCOUNTER — Encounter (INDEPENDENT_AMBULATORY_CARE_PROVIDER_SITE_OTHER): Payer: Self-pay | Admitting: General Surgery

## 2012-12-16 ENCOUNTER — Ambulatory Visit (INDEPENDENT_AMBULATORY_CARE_PROVIDER_SITE_OTHER): Payer: BC Managed Care – PPO | Admitting: General Surgery

## 2012-12-16 ENCOUNTER — Ambulatory Visit
Admission: RE | Admit: 2012-12-16 | Discharge: 2012-12-16 | Disposition: A | Discharge status: Home / Self Care | Payer: BC Managed Care – PPO | Source: Ambulatory Visit

## 2012-12-16 ENCOUNTER — Other Ambulatory Visit: Payer: Self-pay

## 2012-12-16 VITALS — BP 116/68 | HR 97 | Temp 97.1°F | Ht 63.0 in | Wt 167.6 lb

## 2012-12-16 DIAGNOSIS — Z1231 Encounter for screening mammogram for malignant neoplasm of breast: Secondary | ICD-10-CM

## 2012-12-16 DIAGNOSIS — C180 Malignant neoplasm of cecum: Secondary | ICD-10-CM

## 2012-12-16 LAB — HM MAMMOGRAPHY: HM Mammogram: NORMAL

## 2012-12-16 NOTE — Assessment & Plan Note (Signed)
Pt doing well. Has appt with Dr. Darnelle Catalan later this month.  Will need colonoscopy at 1 year.    F/U me in 3-6 months.

## 2012-12-16 NOTE — Patient Instructions (Signed)
Take colace 100 mg twice daily and miralax 17 gm once daily.  If stools get runny, can back off.  Follow up with me in 3-6 months.

## 2012-12-16 NOTE — Progress Notes (Signed)
HISTORY: Pt is 2 weeks s/p lap right colectomy.  She is doing well other than some constipation.  She is off narcotics.  She denies n/v/f/c.  She has a little spot on abdominal wall that is sore.  She is back on coumadin and therapeutic.      EXAM: General:  Alert and oriented.   Incision:  Well healed.     PATHOLOGY: 1. Colon, segmental resection for tumor, Right - INVASIVE MODERATELY TO POORLY DIFFERENTIATED ADENOCARCINOMA, SPANNING 2.2 CM IN GREATEST DIMENSION. - TUMOR INVADES INTO BUT NOT THROUGH MUSCULARIS PROPRIA. - LYMPH/VASCULAR INVASION IS IDENTIFIED. - TWELVE BENIGN LYMPH NODES WITH NO TUMOR SEEN (0/12). - MARGINS ARE NEGATIVE. - SEE ONCOLOGY TEMPLATE. 2. Liver, wedge biopsy, Left - BENIGN HYALINIZED LIVER NODULE. - NO TUMOR SEEN.   ASSESSMENT AND PLAN:   Cecal cancer Pt doing well. Has appt with Dr. Darnelle Catalan later this month.  Will need colonoscopy at 1 year.    F/U me in 3-6 months.        Maudry Diego, MD Surgical Oncology, General & Endocrine Surgery [facility]  Nadean Corwin, MD Lucky Cowboy, MD

## 2012-12-19 ENCOUNTER — Encounter: Payer: Self-pay | Admitting: Cardiology

## 2012-12-20 ENCOUNTER — Encounter: Payer: Self-pay | Admitting: Cardiology

## 2012-12-25 ENCOUNTER — Telehealth: Payer: Self-pay | Admitting: Cardiology

## 2012-12-25 NOTE — Telephone Encounter (Signed)
AS of today Pt has Not come to Pick up FMLA, This Will be Mailed out to  Pt Home Address.  12/25/12/KM

## 2012-12-27 ENCOUNTER — Ambulatory Visit: Payer: BC Managed Care – PPO | Admitting: Pulmonary Disease

## 2012-12-27 ENCOUNTER — Other Ambulatory Visit: Payer: Self-pay | Admitting: *Deleted

## 2012-12-27 DIAGNOSIS — C180 Malignant neoplasm of cecum: Secondary | ICD-10-CM

## 2012-12-30 ENCOUNTER — Ambulatory Visit (HOSPITAL_BASED_OUTPATIENT_CLINIC_OR_DEPARTMENT_OTHER): Payer: BC Managed Care – PPO | Admitting: Oncology

## 2012-12-30 ENCOUNTER — Other Ambulatory Visit (HOSPITAL_BASED_OUTPATIENT_CLINIC_OR_DEPARTMENT_OTHER): Payer: BC Managed Care – PPO | Admitting: Lab

## 2012-12-30 ENCOUNTER — Ambulatory Visit: Payer: BC Managed Care – PPO

## 2012-12-30 ENCOUNTER — Telehealth: Payer: Self-pay | Admitting: Oncology

## 2012-12-30 ENCOUNTER — Encounter: Payer: Self-pay | Admitting: Oncology

## 2012-12-30 ENCOUNTER — Other Ambulatory Visit: Payer: BC Managed Care – PPO | Admitting: Lab

## 2012-12-30 VITALS — BP 136/78 | HR 98 | Temp 97.9°F | Resp 20 | Ht 63.0 in | Wt 168.5 lb

## 2012-12-30 DIAGNOSIS — C180 Malignant neoplasm of cecum: Secondary | ICD-10-CM

## 2012-12-30 DIAGNOSIS — Z85038 Personal history of other malignant neoplasm of large intestine: Secondary | ICD-10-CM

## 2012-12-30 LAB — COMPREHENSIVE METABOLIC PANEL (CC13)
ALT: 15 U/L (ref 0–55)
AST: 17 U/L (ref 5–34)
Albumin: 3.5 g/dL (ref 3.5–5.0)
Alkaline Phosphatase: 91 U/L (ref 40–150)
BUN: 14.1 mg/dL (ref 7.0–26.0)
Calcium: 9.1 mg/dL (ref 8.4–10.4)
Chloride: 106 mEq/L (ref 98–107)
Potassium: 3.7 mEq/L (ref 3.5–5.1)
Sodium: 143 mEq/L (ref 136–145)
Total Protein: 6.8 g/dL (ref 6.4–8.3)

## 2012-12-30 LAB — CBC WITH DIFFERENTIAL/PLATELET
Basophils Absolute: 0.1 10*3/uL (ref 0.0–0.1)
EOS%: 3.8 % (ref 0.0–7.0)
HGB: 10 g/dL — ABNORMAL LOW (ref 11.6–15.9)
MCH: 23 pg — ABNORMAL LOW (ref 25.1–34.0)
MCV: 74 fL — ABNORMAL LOW (ref 79.5–101.0)
MONO%: 12.6 % (ref 0.0–14.0)
NEUT#: 3.1 10*3/uL (ref 1.5–6.5)
RBC: 4.37 10*6/uL (ref 3.70–5.45)
RDW: 19.6 % — ABNORMAL HIGH (ref 11.2–14.5)
lymph#: 1.9 10*3/uL (ref 0.9–3.3)

## 2012-12-30 MED ORDER — POLYSACCHARIDE IRON COMPLEX 150 MG PO CAPS: 150.0000 mg | ORAL_CAPSULE | Freq: Two times a day (BID) | ORAL | Status: DC

## 2012-12-30 NOTE — Progress Notes (Signed)
ID: Anita Duke   DOB: 29-Aug-1941  MR#: [id number]  OZH#:[id number]  PCP: Nadean Corwin, MD GYN:  SUAlmond Lint OTHER MD: Jerral Bonito, Melvia Heaps    HISTORY OF PRESENT ILLNESS: Anita Duke had routine colonoscopy to screening under [facility] 11/14/2012. This showed a sessile polyp in the cecum measuring at least 2 cm. There was a second polyp in the transverse colon. 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  The patient was staged with CT scans of the chest abdomen and pelvis 11/19/2012, and these were negative. She proceeded to partial colectomy under Almond Lint 12/03/2012. 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 Margins were negative. Biopsy of an incidentally noted liver nodule showed benign hyalinized tissue  Patient's subsequent history is as detailed below  INTERVAL HISTORY: Anita Duke was seen in clinic today accompanied by her husband [name].   REVIEW OF SYSTEMS: She did well from her surgery, with no unusual bleeding, fever, or wound complications. She does have a significant cardiac history, which is not detailed here. Aside from that a detailed review of systems today was noncontributory.  PAST MEDICAL HISTORY: Past Medical History  Diagnosis Date  . Bradycardia     Post cardioversion February, 2011,/digoxin/Cardizem metoprolol stopped, may need pacemaker  . Mitral regurgitation     Treated with repair, atrial appendage was removed or tied off at surgery.. this was proven by TEE February, 2011  . Pulmonary HTN     55 mmHg, echo, February, 2011 / no mention of pulmonary hypertension echo, June, 2011  . Right  ventricular dysfunction     Mild to moderate, echo, February, 2011 / normalized echo, June, 2011  . TR (tricuspid regurgitation)     Moderate, echo, February, 2011 / trivial, echo, June, 2011  . Mitral stenosis     Mild, February, 2011, post mitral valve repair / mild, echo, June, 2011  . CAD (coronary artery disease)     LIMA to the LAD at time of mitral valve repair / LIMA atretic,, February, 2011  . Ejection fraction < 50%     EF 30% echo and TEE diagnosis February, 2011, possibly rate related / no contraction by catheter, bradycardia so carvedilol cannot be used / EF 55-60% echo, June, 2011 / EF 50%, echo, February, 2012  . Hemorrhoids, internal   . Diverticulosis of colon   . Colon polyp, hyperplastic   . Environmental allergies   . atrial appendage     Removed were tied off at surgery, proven by TEE February, 2011  . Rash     ?Vicodin, Lotensin, Toprol  . Drug therapy     Tikosyn,  stopped 02/10/2012  . H/O amiodarone therapy     Satrted 02/12/2012  . PONV (postoperative nausea and vomiting)   . Anginal pain   . High cholesterol     takes Lipitor daily  . Asthma   . COPD (chronic obstructive pulmonary disease)   . Shortness of breath     Evaluated by Dr. Craige Cotta. Mat [id number].... bronchodilator response,,  medication started  /  patient much improved June 14, 201 2  . HTN (hypertension)     takes Mavik daily  . Myocardial infarction 04/2012  ?  Marland Kitchen  Pacemaker     May 17, 2012 for  bradycardia and sick sinus syndrome  . CHF (congestive heart failure) 2011    Mild in hospital February, 2011;takes Lasix daily  . Cough     thinks may be related to [facility]   . History of bronchitis     many yrs ago  . Seasonal allergies     takes Claritin daily  . Bruises easily     takes Warfarin daily  . Hemorrhoids   . History of colon polyps   . Hypokalemia     takes KDur daily  . Type II diabetes mellitus     takes Metformin daily  . Cataracts, bilateral     immature  . Depression      takes Effexor daily  . Atrial fibrillation     Treated with maze procedure / recurrent atrial fibrillation February, 2011... TEE cardioversion October 19, 2009  . Atrial tachycardia     February, 2011... Tikosyn started in hospital  . QT prolongation     Tikosyn and Effexor. QT prolonged October 13, 2011, peak is in dose reduced from 500  to -250 twice a day  . Sick sinus syndrome     s/p Medtronic dual chamber PPM 05/17/12  . Acquired complete AV block     AV node ablation May 28, 2012  . 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 Family history of anesthesia complication     sister hard to wake up  . Anemia     Hemoglobin 10.4, December, 2013  . GERD (gastroesophageal reflux disease)   . Arthritis     "not bad; little in my hands; some in my knees" (05/28/2012)  . Cecal cancer     PAST SURGICAL HISTORY: Past Surgical History  Procedure Laterality Date  . Mitral valve repair  2001    "anterior and posterior leaflets" (12/04/2012)  . Maze  2001    w/ MVR & CABG  . Cardioversion  02/08/2012    Procedure: CARDIOVERSION;  Surgeon: [name], MD;  Location: [facility] OR;  Service: Cardiovascular;  Laterality: N/A;  . Cardioversion  02/29/2012    Procedure: CARDIOVERSION;  Surgeon: [name], MD;  Location: [facility] OR;  Service: Cardiovascular;  Laterality: N/A;  . Cardioversion  05/15/2012    Procedure: CARDIOVERSION;  Surgeon: [name], MD;  Location: [facility] ENDOSCOPY;  Service: Cardiovascular;  Laterality: N/A;  . Tonsillectomy and adenoidectomy  ~ 1950  . Pacemaker insertion  05/17/2012    MDT Adapta L implanted by Dr Johney Frame for tachy/brady syndrome  . Av nodal ablation  05/28/12  . Cholecystectomy  1995  . Cardiac catheterization  05/2012  . Laparoscopic right hemi colectomy  12/03/2012    Procedure: LAPAROSCOPIC RIGHT HEMI COLECTOMY;  Surgeon: Almond Lint, MD;  Location: [facility] OR;  Service: General;;  . Umbilical hernia repair N/A 12/03/2012    Procedure: HERNIA REPAIR  UMBILICAL ADULT;  Surgeon: Almond Lint, MD;  Location: [facility] OR;  Service: General;  Laterality: N/A;  . Diagnostic laparoscopic liver biopsy Left 12/03/2012    Procedure: DIAGNOSTIC LAPAROSCOPIC LIVER BIOPSY;  Surgeon: Almond Lint, MD;  Location: [facility] OR;  Service: General;  Laterality: Left;  . Breast lumpectomy Bilateral 1990's  . Abdominal hysterectomy  ?2002  . Coronary artery bypass graft  2001    CABG X1 "at time of mitral valve repair" (12/04/2012  . Insert / replace / remove pacemaker  05/17/2012    MDT Adapta L implanted by Dr Johney Frame for tachy/brady  syndrome  . Maze      [facility] / Maze procedure/ atrial appendage removed were tied off    FAMILY HISTORY Family History  Problem Relation Age of Onset  . Pancreatic cancer Maternal Grandfather   . Heart disease Father   . Diabetes Father   . Heart disease Sister   . Diabetes Mother   . Kidney disease Mother   . Diabetes Sister   . Diabetes Paternal Grandfather   . Diabetes Paternal Grandmother   . Heart disease Paternal Uncle    the patient's father died from heart disease at the age of 42. The patient's mother died at the age of 47 with Alzheimer's disease. The patient has 2 sisters, no brothers. One of the patient's grand father's had pancreatic cancer diagnosed at age 60. The patient's mother's sister was diagnosed with colon cancer late in life.  GYNECOLOGIC HISTORY: Menarche age 10, menopause approximately age 74. The patient took estrogen approximately 15 years. She is GX P0.  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 Her husband Susy Frizzle is a retired Theme park manager. He also talked school here in [place].   ADVANCED DIRECTIVES:  HEALTH MAINTENANCE: History  Substance Use Topics  . Smoking status: Never Smoker   . Smokeless tobacco: Never Used  . Alcohol Use: No     Colonoscopy: March 2014  PAP:  Bone density:  Lipid panel:  Allergies  Allergen Reactions  . Demerol  (Meperidine) Nausea And Vomiting  . Morphine Other (See Comments)    vomiting  . Tetanus Toxoid Rash    "years ago"    Current Outpatient Prescriptions  Medication Sig Dispense Refill  . acetaminophen (TYLENOL) 500 MG tablet Take 1,000 mg by mouth every 6 (six) hours as needed. For pain      . aspirin 81 MG tablet Take 81 mg by mouth daily.        Marland Kitchen atorvastatin (LIPITOR) 40 MG tablet Take 1 tablet (40 mg total) by mouth daily.  90 tablet  3  . budesonide-formoterol (SYMBICORT) 160-4.5 MCG/ACT inhaler Inhale 2 puffs into the lungs 2 (two) times daily.      . calcium carbonate (OS-CAL) 600 MG TABS Take 600 mg by mouth daily.       . carvedilol (COREG) 3.125 MG tablet Take 1 tablet (3.125 mg total) by mouth 2 (two) times daily with a meal.  180 tablet  3  . cholecalciferol (VITAMIN D) 1000 UNITS tablet Take 4,000 Units by mouth daily.      . fish oil-omega-3 fatty acids 1000 MG capsule Take 1 g by mouth 2 (two) times daily.       . furosemide (LASIX) 40 MG tablet Take 0.5 tablets (20 mg total) by mouth daily.  90 tablet  3  . loratadine (CLARITIN) 10 MG tablet Take 10 mg by mouth daily.      . metFORMIN (GLUCOPHAGE) 500 MG tablet Take 500 mg by mouth daily.       . potassium chloride SA (K-DUR,KLOR-CON) 20 MEQ tablet Take 2 tablets (40 mEq total) by mouth daily.  180 tablet  3  . trandolapril (MAVIK) 4 MG tablet Take 8 mg by mouth daily.       Marland Kitchen venlafaxine (EFFEXOR) 37.5 MG tablet Take 37.5 mg by mouth daily.       Marland Kitchen warfarin (COUMADIN) 2.5 MG tablet Take 2.5 mg by mouth daily.      Marland Kitchen warfarin (COUMADIN) 5 MG tablet Take  1 tablet (5 mg total) by mouth daily.  1 tablet  0  . iron polysaccharides (NIFEREX) 150 MG capsule Take 1 capsule (150 mg total) by mouth 2 (two) times daily.  100 capsule  6  . oxyCODONE (OXY IR/ROXICODONE) 5 MG immediate release tablet Take 1-2 tablets (5-10 mg total) by mouth every 4 (four) hours as needed (pain).  40 tablet  0   No current facility-administered  medications for this visit.    OBJECTIVE: Middle-aged white woman in no acute distress Filed Vitals:   12/30/12 1526  BP: 136/78  Pulse: 98  Temp: 97.9 F (36.6 C)  Resp: 20     Body mass index is 29.86 kg/(m^2).    ECOG FS: 1  Sclerae unicteric Oropharynx clear No cervical or supraclavicular adenopathy Lungs no rales or rhonchi Heart regular rate and rhythm, no murmur appreciated Abd soft, nontender, positive bowel sounds; the laparoscopic incisions have healed nicely. MSK no focal spinal tenderness, no peripheral edema Neuro: nonfocal, well oriented, appropriate affect Breasts: Deferred   LAB RESULTS: Lab Results  Component Value Date   WBC 6.1 12/30/2012   NEUTROABS 3.1 12/30/2012   HGB 10.0* 12/30/2012   HCT 32.3* 12/30/2012   MCV 74.0* 12/30/2012   PLT 259 12/30/2012      Chemistry      Component Value Date/Time   NA 143 12/30/2012 1511   NA 136 12/07/2012 0635   K 3.7 12/30/2012 1511   K 4.3 12/07/2012 0635   CL 106 12/30/2012 1511   CL 100 12/07/2012 0635   CO2 30* 12/30/2012 1511   CO2 30 12/07/2012 0635   BUN 14.1 12/30/2012 1511   BUN 4* 12/07/2012 0635   CREATININE 0.7 12/30/2012 1511   CREATININE 0.46* 12/07/2012 0635      Component Value Date/Time   CALCIUM 9.1 12/30/2012 1511   CALCIUM 9.3 12/07/2012 0635   ALKPHOS 91 12/30/2012 1511   ALKPHOS 114 11/26/2012 1528   AST 17 12/30/2012 1511   AST 26 11/26/2012 1528   ALT 15 12/30/2012 1511   ALT 20 11/26/2012 1528   BILITOT 0.67 12/30/2012 1511   BILITOT 0.5 11/26/2012 1528       No results found for this basename: LABCA2    No components found with this basename: [id number]    No results found for this basename: INR,  in the last 168 hours  Urinalysis    Component Value Date/Time   COLORURINE YELLOW 11/26/2012 1531   APPEARANCEUR CLOUDY* 11/26/2012 1531   LABSPEC 1.024 11/26/2012 1531   PHURINE 5.0 11/26/2012 1531   GLUCOSEU NEGATIVE 11/26/2012 1531   HGBUR NEGATIVE 11/26/2012 1531   BILIRUBINUR NEGATIVE 11/26/2012  1531   KETONESUR NEGATIVE 11/26/2012 1531   PROTEINUR NEGATIVE 11/26/2012 1531   UROBILINOGEN 1.0 11/26/2012 1531   NITRITE NEGATIVE 11/26/2012 1531   LEUKOCYTESUR MODERATE* 11/26/2012 1531    STUDIES: Mm Digital Screening  12/16/2012  *RADIOLOGY REPORT*  Clinical Data: Screening.  DIGITAL BILATERAL SCREENING MAMMOGRAM WITH CAD   DIGITAL BREAST TOMOSYNTHESIS  Digital breast tomosynthesis images are acquired in two projections.  These images are reviewed in combination with the digital mammogram, confirming the findings below.  Comparison:  Previous exams.  FINDINGS:  ACR Breast Density Category 3: The breast tissue is heterogeneously dense.  No suspicious masses, architectural distortion, or calcifications are present.  Images were processed with CAD.  IMPRESSION: No mammographic evidence of malignancy.  A result letter of this screening mammogram will be mailed directly to the  patient.  RECOMMENDATION: Screening mammogram in one year. (Code:[id number]-B-01Y)  BI-RADS CATEGORY 1:  Negative.   Original Report Authenticated By: Rolla Plate, M.D.     ASSESSMENT: 72 y.o. 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 She understands that the prognosis for stage I colon cancer with surgery alone is excellent, with a recurrence rate of less than 10%. Accordingly chemotherapy is not recommended. More controversial is whether we should send screening lab work for [employer] syndrome. Many experts these days recommend that all cases of colon cancer be tested and I have requested it in her case. She will call for results after approximately 2 weeks.   In terms of followup, largely  this will be through gastroenterology, but I will see her one more time after her next colonoscopy, which is already scheduled for January of 2015. If everything looks good likely she will "graduate" from followup here then.  Wilba has had iron deficiency anemia at least since September of 2013, and I recommended she start iron supplementation. We discussed that in detail as well. Since she sees her primary care physician every 3 months, and he usually checks labs, she will follow her hemoglobin and MCV that way. If they do not normalize after 6 months she will let me know.       Samanthan Dugo C    12/30/2012

## 2012-12-30 NOTE — Progress Notes (Signed)
Checked in new pt with no financial concerns. °

## 2013-01-03 ENCOUNTER — Ambulatory Visit (INDEPENDENT_AMBULATORY_CARE_PROVIDER_SITE_OTHER): Payer: BC Managed Care – PPO | Admitting: Pharmacist

## 2013-01-03 DIAGNOSIS — Z8679 Personal history of other diseases of the circulatory system: Secondary | ICD-10-CM

## 2013-01-03 DIAGNOSIS — I4891 Unspecified atrial fibrillation: Secondary | ICD-10-CM

## 2013-01-03 DIAGNOSIS — Z7901 Long term (current) use of anticoagulants: Secondary | ICD-10-CM

## 2013-01-03 LAB — POCT INR: INR: 2.2

## 2013-01-10 ENCOUNTER — Other Ambulatory Visit: Payer: Self-pay | Admitting: Oncology

## 2013-01-10 NOTE — Progress Notes (Signed)
I called Anita Duke with the results of her microsatellite instability test, which showed no microsatellite instability. There is no evidence of Lynch syndrome. She was very pleased with the results.

## 2013-01-21 ENCOUNTER — Ambulatory Visit: Payer: BC Managed Care – PPO | Admitting: Pulmonary Disease

## 2013-02-06 ENCOUNTER — Ambulatory Visit (INDEPENDENT_AMBULATORY_CARE_PROVIDER_SITE_OTHER): Payer: BC Managed Care – PPO

## 2013-02-06 DIAGNOSIS — I4891 Unspecified atrial fibrillation: Secondary | ICD-10-CM

## 2013-02-06 DIAGNOSIS — Z8679 Personal history of other diseases of the circulatory system: Secondary | ICD-10-CM

## 2013-02-06 DIAGNOSIS — Z7901 Long term (current) use of anticoagulants: Secondary | ICD-10-CM

## 2013-02-18 ENCOUNTER — Other Ambulatory Visit: Payer: Self-pay | Admitting: *Deleted

## 2013-02-18 MED ORDER — WARFARIN SODIUM 2.5 MG PO TABS: 2.5000 mg | ORAL_TABLET | Freq: Every day | ORAL | Status: DC

## 2013-02-21 ENCOUNTER — Other Ambulatory Visit: Payer: Self-pay | Admitting: Cardiology

## 2013-02-25 ENCOUNTER — Encounter: Payer: Self-pay | Admitting: Cardiology

## 2013-02-26 ENCOUNTER — Encounter: Payer: Self-pay | Admitting: Cardiology

## 2013-02-26 ENCOUNTER — Ambulatory Visit (INDEPENDENT_AMBULATORY_CARE_PROVIDER_SITE_OTHER): Payer: BC Managed Care – PPO | Admitting: Cardiology

## 2013-02-26 ENCOUNTER — Ambulatory Visit (INDEPENDENT_AMBULATORY_CARE_PROVIDER_SITE_OTHER): Payer: BC Managed Care – PPO | Admitting: *Deleted

## 2013-02-26 VITALS — BP 151/81 | HR 91 | Ht 63.0 in | Wt 173.2 lb

## 2013-02-26 DIAGNOSIS — R0989 Other specified symptoms and signs involving the circulatory and respiratory systems: Secondary | ICD-10-CM

## 2013-02-26 DIAGNOSIS — Z7901 Long term (current) use of anticoagulants: Secondary | ICD-10-CM

## 2013-02-26 DIAGNOSIS — I498 Other specified cardiac arrhythmias: Secondary | ICD-10-CM

## 2013-02-26 DIAGNOSIS — C180 Malignant neoplasm of cecum: Secondary | ICD-10-CM

## 2013-02-26 DIAGNOSIS — I2510 Atherosclerotic heart disease of native coronary artery without angina pectoris: Secondary | ICD-10-CM

## 2013-02-26 DIAGNOSIS — I4891 Unspecified atrial fibrillation: Secondary | ICD-10-CM

## 2013-02-26 DIAGNOSIS — R9430 Abnormal result of cardiovascular function study, unspecified: Secondary | ICD-10-CM

## 2013-02-26 DIAGNOSIS — Z8679 Personal history of other diseases of the circulatory system: Secondary | ICD-10-CM

## 2013-02-26 DIAGNOSIS — I471 Supraventricular tachycardia: Secondary | ICD-10-CM

## 2013-02-26 DIAGNOSIS — R0602 Shortness of breath: Secondary | ICD-10-CM

## 2013-02-26 DIAGNOSIS — IMO0002 Reserved for concepts with insufficient information to code with codable children: Secondary | ICD-10-CM

## 2013-02-26 DIAGNOSIS — I050 Rheumatic mitral stenosis: Secondary | ICD-10-CM

## 2013-02-26 DIAGNOSIS — I4719 Other supraventricular tachycardia: Secondary | ICD-10-CM

## 2013-02-26 NOTE — Assessment & Plan Note (Signed)
Over time we have seen variation in her ejection fraction. Her most recent echo in December, 2013 revealed an EF of 40-45%. There was septal dyssynergy and hypokinesis of the apex. This was done after she had her pacemaker and AV nodal ablation.

## 2013-02-26 NOTE — Assessment & Plan Note (Signed)
The patient has mild functional mitral stenosis from her mitral valve repair. I am not convinced that her valvular problems are causing her exertional symptoms.

## 2013-02-26 NOTE — Assessment & Plan Note (Signed)
I cannot explain her symptoms. I told her and her husband today that I will review the situation further. I may plan to proceed with a cardiopulmonary exercise test to see if I can get a clue about how to proceed with her evaluation. She is quite stable. There is no urgency. She and her husband know that I will be away for a few weeks.

## 2013-02-26 NOTE — Assessment & Plan Note (Signed)
Her rhythm is not assessed today. She had AV node ablation. There is of course a pacemaker in place.

## 2013-02-26 NOTE — Assessment & Plan Note (Signed)
She has had some intermittent asthmatic symptoms. At some point a rereview of her pulmonary status will be appropriate.

## 2013-02-26 NOTE — Progress Notes (Signed)
HPI  The patient is seen to followup mitral valve disease, atrial arrhythmias, pacemaker, mitral valve disease. I saw her last in February, 2014. She had made a trip to [place]. She thought that her breathing was much better when she was there. She then returned home and developed an upper respiratory infection. At that time we were waiting for this to improve before assessing her overall cardiac status any further.  In the meantime the workup for anemia revealed cancer of the colon. She has had successful surgery. The cancer was removed and she does not need chemotherapy.  She returns today and feels well overall. However she continues to have marked exertional shortness of breath. The etiology remains unclear. This is the symptom that she was having in February. There is no fluid overload. She does not have PND or orthopnea. There is no edema.  Allergies  Allergen Reactions  . Demerol (Meperidine) Nausea And Vomiting  . Morphine Other (See Comments)    vomiting  . Tetanus Toxoid Rash    "years ago"    Current Outpatient Prescriptions  Medication Sig Dispense Refill  . acetaminophen (TYLENOL) 500 MG tablet Take 1,000 mg by mouth every 6 (six) hours as needed. For pain      . aspirin 81 MG tablet Take 81 mg by mouth daily.        Marland Kitchen atorvastatin (LIPITOR) 40 MG tablet Take 1 tablet (40 mg total) by mouth daily.  90 tablet  3  . budesonide-formoterol (SYMBICORT) 160-4.5 MCG/ACT inhaler Inhale 2 puffs into the lungs 2 (two) times daily.      . calcium carbonate (OS-CAL) 600 MG TABS Take 600 mg by mouth daily.       . carvedilol (COREG) 3.125 MG tablet Take 1 tablet (3.125 mg total) by mouth 2 (two) times daily with a meal.  180 tablet  3  . cholecalciferol (VITAMIN D) 1000 UNITS tablet Take 4,000 Units by mouth daily.      . fish oil-omega-3 fatty acids 1000 MG capsule Take 1 g by mouth 2 (two) times daily.       . furosemide (LASIX) 40 MG tablet Take 0.5 tablets (20 mg total) by mouth  daily.  90 tablet  3  . iron polysaccharides (NIFEREX) 150 MG capsule Take 1 capsule (150 mg total) by mouth 2 (two) times daily.  100 capsule  6  . loratadine (CLARITIN) 10 MG tablet Take 10 mg by mouth daily.      . metFORMIN (GLUCOPHAGE) 500 MG tablet Take 500 mg by mouth daily.       . potassium chloride SA (K-DUR,KLOR-CON) 20 MEQ tablet Take 2 tablets (40 mEq total) by mouth daily.  180 tablet  3  . trandolapril (MAVIK) 4 MG tablet Take 8 mg by mouth daily.       Marland Kitchen venlafaxine (EFFEXOR) 37.5 MG tablet Take 37.5 mg by mouth daily.       Marland Kitchen warfarin (COUMADIN) 2.5 MG tablet Take as directed by anticoagulation clinic  40 tablet  3   No current facility-administered medications for this visit.    History   Social History  . Marital Status: Married    Spouse Name: N/A    Number of Children: 0  . 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    Marland Kitchen     Social History Main Topics  . Smoking status: Never Smoker   . Smokeless  tobacco: Never Used  . Alcohol Use: No  . Drug Use: No  . Sexually Active: Not Currently    Birth Control/ Protection: Surgical   Other Topics Concern  . Not on file   Social History Narrative   Does not et regular exercise   Daily caffeine use          Family History  Problem Relation Age of Onset  . Pancreatic cancer Maternal Grandfather   . Heart disease Father   . Diabetes Father   . Heart disease Sister   . Diabetes Mother   . Kidney disease Mother   . Diabetes Sister   . Diabetes Paternal Grandfather   . Diabetes Paternal Grandmother   . Heart disease Paternal Uncle     Past Medical History  Diagnosis Date  . Bradycardia     Post cardioversion February, 2011,/digoxin/Cardizem metoprolol stopped, may need pacemaker  . Mitral regurgitation     Treated with repair, atrial appendage was removed or tied off at surgery.. this was proven by TEE February, 2011  . Pulmonary HTN     55 mmHg, echo,  February, 2011 / no mention of pulmonary hypertension echo, June, 2011  . Right ventricular dysfunction     Mild to moderate, echo, February, 2011 / normalized echo, June, 2011  . TR (tricuspid regurgitation)     Moderate, echo, February, 2011 / trivial, echo, June, 2011  . Mitral stenosis     Mild, February, 2011, post mitral valve repair / mild, echo, June, 2011  . CAD (coronary artery disease)     LIMA to the LAD at time of mitral valve repair / LIMA atretic,, February, 2011  . Ejection fraction < 50%     EF 30% echo and TEE diagnosis February, 2011, possibly rate related / no contraction by catheter, bradycardia so carvedilol cannot be used / EF 55-60% echo, June, 2011 / EF 50%, echo, February, 2012  . Hemorrhoids, internal   . Diverticulosis of colon   . Colon polyp, hyperplastic   . Environmental allergies   . atrial appendage     Removed were tied off at surgery, proven by TEE February, 2011  . Rash     ?Vicodin, Lotensin, Toprol  . Drug therapy     Tikosyn,  stopped 02/10/2012  . H/O amiodarone therapy     Satrted 02/12/2012  . PONV (postoperative nausea and vomiting)   . Anginal pain   . High cholesterol     takes Lipitor daily  . Asthma   . COPD (chronic obstructive pulmonary disease)   . Shortness of breath     Evaluated by Dr. Craige Cotta. Mat [id number].... bronchodilator response,,  medication started  /  patient much improved June 14, 201 2  . HTN (hypertension)     takes Mavik daily  . Myocardial infarction 04/2012  ?  Marland Kitchen Pacemaker     May 17, 2012 for  bradycardia and sick sinus syndrome  . CHF (congestive heart failure) 2011    Mild in hospital February, 2011;takes Lasix daily  . Cough     thinks may be related to [facility]   . History of bronchitis     many yrs ago  . Seasonal allergies     takes Claritin daily  . Bruises easily     takes Warfarin daily  . Hemorrhoids   . History of colon polyps   . Hypokalemia     takes KDur daily  . Type II diabetes mellitus  takes Metformin daily  . Cataracts, bilateral     immature  . Depression     takes Effexor daily  . Atrial fibrillation     Treated with maze procedure / recurrent atrial fibrillation February, 2011... TEE cardioversion October 19, 2009  . Atrial tachycardia     February, 2011... Tikosyn started in hospital  . QT prolongation     Tikosyn and Effexor. QT prolonged October 13, 2011, peak is in dose reduced from 500  to -250 twice a day  . Sick sinus syndrome     s/p Medtronic dual chamber PPM 05/17/12  . Acquired complete AV block     AV node ablation May 28, 2012  . 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 Family history of anesthesia complication     sister hard to wake up  . Anemia     Hemoglobin 10.4, December, 2013  . GERD (gastroesophageal reflux disease)   . Arthritis     "not bad; little in my hands; some in my knees" (05/28/2012)  . Cecal cancer     Past Surgical History  Procedure Laterality Date  . Mitral valve repair  2001    "anterior and posterior leaflets" (12/04/2012)  . Maze  2001    w/ MVR & CABG  . Cardioversion  02/08/2012    Procedure: CARDIOVERSION;  Surgeon: [name], MD;  Location: [facility] OR;  Service: Cardiovascular;  Laterality: N/A;  . Cardioversion  02/29/2012    Procedure: CARDIOVERSION;  Surgeon: [name], MD;  Location: [facility] OR;  Service: Cardiovascular;  Laterality: N/A;  . Cardioversion  05/15/2012    Procedure: CARDIOVERSION;  Surgeon: [name], MD;  Location: [facility] ENDOSCOPY;  Service: Cardiovascular;  Laterality: N/A;  . Tonsillectomy and adenoidectomy  ~ 1950  . Pacemaker insertion  05/17/2012    MDT Adapta L implanted by Dr Johney Frame for tachy/brady syndrome  . Av nodal ablation  05/28/12  . Cholecystectomy  1995  . Cardiac catheterization  05/2012  . Laparoscopic right hemi colectomy  12/03/2012    Procedure: LAPAROSCOPIC RIGHT HEMI COLECTOMY;  Surgeon: Almond Lint, MD;  Location: [facility] OR;  Service: General;;  . Umbilical  hernia repair N/A 12/03/2012    Procedure: HERNIA REPAIR UMBILICAL ADULT;  Surgeon: Almond Lint, MD;  Location: [facility] OR;  Service: General;  Laterality: N/A;  . Diagnostic laparoscopic liver biopsy Left 12/03/2012    Procedure: DIAGNOSTIC LAPAROSCOPIC LIVER BIOPSY;  Surgeon: Almond Lint, MD;  Location: [facility] OR;  Service: General;  Laterality: Left;  . Breast lumpectomy Bilateral 1990's  . Abdominal hysterectomy  ?2002  . Coronary artery bypass graft  2001    CABG X1 "at time of mitral valve repair" (12/04/2012  . Insert / replace / remove pacemaker  05/17/2012    MDT Adapta L implanted by Dr Johney Frame for tachy/brady syndrome  . Maze      [facility] / Maze procedure/ atrial appendage removed were tied off    Patient Active Problem List   Diagnosis Date Noted  . 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 Special screening for malignant neoplasms, colon 10/31/2012  . Anemia   . Acquired complete AV block   . Sick sinus syndrome   . Pacemaker-Medtronic 05/20/2012  . Allergic rhinitis 04/25/2012  . H/O amiodarone therapy   . Drug therapy   . QT prolongation   . Mild intermittent asthma 02/16/2011  . Shortness of breath   . Ejection fraction < 50%   . Atrial fibrillation   . Atrial  tachycardia   . Bradycardia   . Mitral regurgitation   . Pulmonary HTN   . Right ventricular dysfunction   . TR (tricuspid regurgitation)   . Mitral stenosis   . CAD (coronary artery disease)   . Dizziness   . Warfarin anticoagulation   . atrial appendage   . S/P mitral valve repair   . Hx of CABG   . Rash   . DIABETES MELLITUS 03/02/2009  . HYPERLIPIDEMIA 07/07/2008  . HYPERTENSION 07/07/2008  . INTERNAL HEMORRHOIDS 07/07/2008  . COLONIC POLYPS, HYPERPLASTIC 08/04/2002  . DIVERTICULOSIS, COLON 08/04/2002    ROS   patient denies fever, chills, headache, sweats, rash, change in vision, change in hearing, chest pain, cough, nausea vomiting, urinary symptoms. All other systems are reviewed and are negative.  PHYSICAL  EXAM  she looks good today. She is oriented to person time and place. Affect is normal. She's here with her husband. There is no jugulovenous distention. Lungs are clear. Respiratory effort is nonlabored. Cardiac exam reveals an S1 and S2. There no clicks or significant murmurs. The abdomen is soft. There is no peripheral edema.  Filed Vitals:   02/26/13 1457  BP: 151/81  Pulse: 91  Height: 5\' 3"  (1.6 m)  Weight: 173 lb 3.2 oz (78.563 kg)  SpO2: 97%     ASSESSMENT & PLAN

## 2013-02-26 NOTE — Assessment & Plan Note (Signed)
In February, 2011 her cath showed that her LIMA was atretic. She did not have any severe disease.

## 2013-02-26 NOTE — Patient Instructions (Addendum)
**Note De-identified Lauris Keepers Obfuscation** Your physician recommends that you continue on your current medications as directed. Please refer to the Current Medication list given to you today.  Your physician recommends that you schedule a follow-up appointment in: 2 months 

## 2013-02-26 NOTE — Assessment & Plan Note (Signed)
Her cancer has been removed successfully. She does not need chemotherapy.

## 2013-02-28 ENCOUNTER — Encounter: Payer: Self-pay | Admitting: Cardiology

## 2013-03-13 ENCOUNTER — Encounter: Payer: Self-pay | Admitting: Cardiology

## 2013-03-25 ENCOUNTER — Ambulatory Visit (INDEPENDENT_AMBULATORY_CARE_PROVIDER_SITE_OTHER): Payer: BC Managed Care – PPO | Admitting: Pharmacist

## 2013-03-25 DIAGNOSIS — Z8679 Personal history of other diseases of the circulatory system: Secondary | ICD-10-CM

## 2013-03-25 DIAGNOSIS — I4891 Unspecified atrial fibrillation: Secondary | ICD-10-CM

## 2013-03-25 DIAGNOSIS — Z7901 Long term (current) use of anticoagulants: Secondary | ICD-10-CM

## 2013-03-27 ENCOUNTER — Encounter: Payer: Self-pay | Admitting: Cardiology

## 2013-03-27 ENCOUNTER — Other Ambulatory Visit: Payer: Self-pay | Admitting: Cardiology

## 2013-03-27 DIAGNOSIS — R0602 Shortness of breath: Secondary | ICD-10-CM

## 2013-03-27 NOTE — Progress Notes (Signed)
   I have reviewed all the data since the patient's last visit. I have decided to proceed with a cardiopulmonary exercise test. Hopefully this will help [name] determine if her exertional shortness of breath is more related to her pulmonary status or her cardiac status. After that I will proceed with further evaluation.  I will speak with Dr. Gala Romney before the study to be sure that I have done everything to be sure that she is completely prepared for the study. Also I will last him to consider whether there any variation's of the study that should be done in her case.  Jerral Bonito

## 2013-03-28 ENCOUNTER — Ambulatory Visit (INDEPENDENT_AMBULATORY_CARE_PROVIDER_SITE_OTHER): Payer: BC Managed Care – PPO | Admitting: General Surgery

## 2013-03-28 ENCOUNTER — Encounter (INDEPENDENT_AMBULATORY_CARE_PROVIDER_SITE_OTHER): Payer: Self-pay | Admitting: General Surgery

## 2013-03-28 VITALS — BP 162/74 | HR 84 | Temp 98.0°F | Resp 20 | Ht 63.0 in | Wt 175.2 lb

## 2013-03-28 DIAGNOSIS — R197 Diarrhea, unspecified: Secondary | ICD-10-CM

## 2013-03-28 DIAGNOSIS — C180 Malignant neoplasm of cecum: Secondary | ICD-10-CM

## 2013-03-28 MED ORDER — LOPERAMIDE HCL 2 MG PO CAPS: 2.0000 mg | ORAL_CAPSULE | Freq: Two times a day (BID) | ORAL | Status: DC

## 2013-03-28 NOTE — Progress Notes (Signed)
HISTORY: Patient is approximately 4 months status post right hemicolectomy for cecal cancer. She is doing reasonably well from an abdominal standpoint other than having some loose stools after eating lunch and dinner. She denies any abdominal pain. She has a reasonable appetite and energy level. She does have significant shortness of breath. This is being addressed by her cardiology team and she has additional testing scheduled on Monday. She is not having any issues with any specific foods. Her colonoscopy is due next year.   PERTINENT REVIEW OF SYSTEMS: Otherwise negative x 11.    Filed Vitals:   03/28/13 0921  BP: 162/74  Pulse: 84  Temp: 98 F (36.7 C)  Resp: 20   Filed Weights   03/28/13 0921  Weight: 175 lb 3.2 oz (79.47 kg)     EXAM: Head: Normocephalic and atraumatic.  Eyes:  Conjunctivae are normal. Pupils are equal, round, and reactive to light. No scleral icterus.  Neck:  Normal range of motion. Neck supple. No tracheal deviation present. No thyromegaly present. No LAD Resp: No respiratory distress, normal effort. Abd:  Abdomen is soft, non distended and non tender. No masses are palpable.  There is no rebound and no guarding. some rectus diastasis in upper abdomen.  No evidence of hernia at midline incision.   Neurological: Alert and oriented to person, place, and time. Coordination normal.  Skin: Skin is warm and dry. No rash noted. No diaphoretic. No erythema. No pallor.  Psychiatric: Normal mood and affect. Normal behavior. Judgment and thought content normal.      ASSESSMENT AND PLAN:   Diarrhea Add 1 imodium cap before lunch and supper.  Advised patient to call if no success.    Cecal cancer Anemia improved.  No clinical evidence of disease.  Colonoscopy in jan/feb. Follow up with me in December.  Pt has oncology follow up in February. Then we can alternate me and Dr. Darnelle Catalan every 6 months.        Maudry Diego, MD Surgical Oncology, General  & Endocrine Surgery [facility]  MCKEOWN,WILLIAM DAVID, MD No ref. provider found

## 2013-03-28 NOTE — Assessment & Plan Note (Signed)
Anemia improved.  No clinical evidence of disease.  Colonoscopy in jan/feb. Follow up with me in December.  Pt has oncology follow up in February. Then we can alternate me and Dr. Darnelle Catalan every 6 months.

## 2013-03-28 NOTE — Patient Instructions (Signed)
Take imodium 1 cap with lunch and supper.  Let us know if that does not work and we will try something different.  If you become constipated, back off to 1/2 pill before lunch and supper.

## 2013-03-28 NOTE — Assessment & Plan Note (Signed)
Add 1 imodium cap before lunch and supper.  Advised patient to call if no success.

## 2013-03-31 ENCOUNTER — Ambulatory Visit (HOSPITAL_COMMUNITY): Payer: BC Managed Care – PPO | Attending: Cardiology

## 2013-03-31 DIAGNOSIS — R0602 Shortness of breath: Secondary | ICD-10-CM | POA: Insufficient documentation

## 2013-04-09 ENCOUNTER — Other Ambulatory Visit: Payer: Self-pay

## 2013-04-17 ENCOUNTER — Ambulatory Visit (INDEPENDENT_AMBULATORY_CARE_PROVIDER_SITE_OTHER): Payer: BC Managed Care – PPO | Admitting: Cardiology

## 2013-04-17 ENCOUNTER — Telehealth: Payer: Self-pay | Admitting: Cardiology

## 2013-04-17 ENCOUNTER — Encounter: Payer: Self-pay | Admitting: Cardiology

## 2013-04-17 ENCOUNTER — Ambulatory Visit (INDEPENDENT_AMBULATORY_CARE_PROVIDER_SITE_OTHER): Payer: BC Managed Care – PPO | Admitting: *Deleted

## 2013-04-17 VITALS — BP 148/76 | HR 92 | Ht 63.0 in | Wt 174.0 lb

## 2013-04-17 DIAGNOSIS — I471 Supraventricular tachycardia: Secondary | ICD-10-CM

## 2013-04-17 DIAGNOSIS — Z9889 Other specified postprocedural states: Secondary | ICD-10-CM

## 2013-04-17 DIAGNOSIS — I4891 Unspecified atrial fibrillation: Secondary | ICD-10-CM

## 2013-04-17 DIAGNOSIS — R42 Dizziness and giddiness: Secondary | ICD-10-CM

## 2013-04-17 DIAGNOSIS — I272 Pulmonary hypertension, unspecified: Secondary | ICD-10-CM

## 2013-04-17 DIAGNOSIS — C180 Malignant neoplasm of cecum: Secondary | ICD-10-CM

## 2013-04-17 DIAGNOSIS — R0989 Other specified symptoms and signs involving the circulatory and respiratory systems: Secondary | ICD-10-CM

## 2013-04-17 DIAGNOSIS — I050 Rheumatic mitral stenosis: Secondary | ICD-10-CM

## 2013-04-17 DIAGNOSIS — J4520 Mild intermittent asthma, uncomplicated: Secondary | ICD-10-CM

## 2013-04-17 DIAGNOSIS — Z8679 Personal history of other diseases of the circulatory system: Secondary | ICD-10-CM

## 2013-04-17 DIAGNOSIS — Z950 Presence of cardiac pacemaker: Secondary | ICD-10-CM

## 2013-04-17 DIAGNOSIS — I2789 Other specified pulmonary heart diseases: Secondary | ICD-10-CM

## 2013-04-17 DIAGNOSIS — Z7901 Long term (current) use of anticoagulants: Secondary | ICD-10-CM

## 2013-04-17 DIAGNOSIS — R001 Bradycardia, unspecified: Secondary | ICD-10-CM

## 2013-04-17 DIAGNOSIS — I498 Other specified cardiac arrhythmias: Secondary | ICD-10-CM

## 2013-04-17 DIAGNOSIS — Z951 Presence of aortocoronary bypass graft: Secondary | ICD-10-CM

## 2013-04-17 DIAGNOSIS — Z01810 Encounter for preprocedural cardiovascular examination: Secondary | ICD-10-CM

## 2013-04-17 DIAGNOSIS — I2510 Atherosclerotic heart disease of native coronary artery without angina pectoris: Secondary | ICD-10-CM

## 2013-04-17 DIAGNOSIS — R9430 Abnormal result of cardiovascular function study, unspecified: Secondary | ICD-10-CM

## 2013-04-17 DIAGNOSIS — R0602 Shortness of breath: Secondary | ICD-10-CM

## 2013-04-17 DIAGNOSIS — D649 Anemia, unspecified: Secondary | ICD-10-CM

## 2013-04-17 DIAGNOSIS — J45909 Unspecified asthma, uncomplicated: Secondary | ICD-10-CM

## 2013-04-17 DIAGNOSIS — I519 Heart disease, unspecified: Secondary | ICD-10-CM

## 2013-04-17 DIAGNOSIS — Q248 Other specified congenital malformations of heart: Secondary | ICD-10-CM

## 2013-04-17 DIAGNOSIS — I442 Atrioventricular block, complete: Secondary | ICD-10-CM

## 2013-04-17 LAB — POCT INR: INR: 1.1

## 2013-04-17 NOTE — Patient Instructions (Addendum)
04/26/2013 is last day to take coumadin Do not take coumadin August 24th  25th and 26th Procedure on August 27th and when instructed to restart coumadin take extra 1/2 tablet for 2 days recheck in coumadin clinic on September 3rd

## 2013-04-17 NOTE — Telephone Encounter (Signed)
Error

## 2013-04-17 NOTE — Assessment & Plan Note (Signed)
The patient continues to have marked exertional shortness of breath. Cardiopulmonary exercise test was done March 31, 2013. I had an extensive discussion with Dr. Gala Romney about the patient and the study results. She does have moderate lung disease. However she appears to have a marked cardiac limitation. Her pacemaker was checked today to see her rate response. It shows a relatively normal histogram of her rates. She does increase her rate appropriately at times. Dr. Johney Frame and I looked at the data together. At this point we will not change her pacemaker settings. Her peak heart rate on her CPX was 94. I am still not sure if this number is adequate with a severe fatigue she had at that time. The patient will have repeat 2-D echo. This will help reassess LV and RV function and possibly give [name] information about pulmonary pressures. We know that while pacing her ejection fraction had decreased compared to the best value we've seen of 55%. The patient will also have a right heart cath. This has been scheduled. After all of this information is obtained, decision will be made as to whether or not the patient should have a more complete right heart cath with exercise and Swan-Ganz catheter in place. Decision will also be made as to whether or not there may be merit in upgrading the patient's pacemaker to a biventricular pacer. I have discussed this option with Dr. Johney Frame. He is willing to proceed if we all get to the opinion of eventually that this should be tried.

## 2013-04-17 NOTE — Assessment & Plan Note (Signed)
The patient's AV node was ablated in September, 2013. This was done after her pacemaker was placed earlier in September, 2013.

## 2013-04-17 NOTE — Assessment & Plan Note (Signed)
The patient does have some pulmonary disease. There is some reactive airway disease. The pulmonologists have not felt that she has severe pulmonary limitation. The recent CPX did not suggest severe pulmonary limitation.

## 2013-04-17 NOTE — Progress Notes (Signed)
HPI  This very pleasant long-term patient of mine is seen for followup. Her overall cardiac status is very complex. I have spent several hours since her last visit reviewing all of her data. The patient had cardiopulmonary exercise test. This showed severe cardiac limitation. I have spoken directly with Dr. Gala Romney about the results of her cardiopulmonary exercise test. She has significant limitation with exercise. There is further discussion below. Also I have spoken with Dr. Johney Frame about her pacing function.   The cardiopulmonary exercise test reveals significant exercise limitation. It is felt to be cardiac, not pulmonary  The patient is functioning on a day-to-day basis. Her overall exercise tolerance is very limited. In addition she is concerned about the ongoing evaluation of unexplained lymph nodes in her husband's neck.  Allergies  Allergen Reactions  . Demerol [Meperidine] Nausea And Vomiting  . Morphine Other (See Comments)    vomiting  . Tetanus Toxoid Rash    "years ago"    Current Outpatient Prescriptions  Medication Sig Dispense Refill  . acetaminophen (TYLENOL) 500 MG tablet Take 1,000 mg by mouth every 6 (six) hours as needed. For pain      . aspirin 81 MG tablet Take 81 mg by mouth daily.        Marland Kitchen atorvastatin (LIPITOR) 40 MG tablet Take 1 tablet (40 mg total) by mouth daily.  90 tablet  3  . budesonide-formoterol (SYMBICORT) 160-4.5 MCG/ACT inhaler Inhale 2 puffs into the lungs 2 (two) times daily.      . calcium carbonate (OS-CAL) 600 MG TABS Take 600 mg by mouth daily.       . carvedilol (COREG) 3.125 MG tablet Take 1 tablet (3.125 mg total) by mouth 2 (two) times daily with a meal.  180 tablet  3  . cholecalciferol (VITAMIN D) 1000 UNITS tablet Take 4,000 Units by mouth daily.      . fish oil-omega-3 fatty acids 1000 MG capsule Take 1 g by mouth 2 (two) times daily.       . furosemide (LASIX) 40 MG tablet Take 0.5 tablets (20 mg total) by mouth daily.  90 tablet   3  . iron polysaccharides (NIFEREX) 150 MG capsule Take 1 capsule (150 mg total) by mouth 2 (two) times daily.  100 capsule  6  . loperamide (IMODIUM) 2 MG capsule Take 1 capsule (2 mg total) by mouth 2 (two) times daily before lunch and supper.  60 capsule  0  . loratadine (CLARITIN) 10 MG tablet Take 10 mg by mouth daily.      . metFORMIN (GLUCOPHAGE) 500 MG tablet Take 500 mg by mouth daily.       . potassium chloride SA (K-DUR,KLOR-CON) 20 MEQ tablet Take 2 tablets (40 mEq total) by mouth daily.  180 tablet  3  . trandolapril (MAVIK) 4 MG tablet Take 8 mg by mouth daily.       Marland Kitchen venlafaxine (EFFEXOR) 37.5 MG tablet Take 37.5 mg by mouth daily.       Marland Kitchen warfarin (COUMADIN) 2.5 MG tablet Take as directed by anticoagulation clinic  40 tablet  3   No current facility-administered medications for this visit.    History   Social History  . Marital Status: Married    Spouse Name: N/A    Number of Children: 0  . 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    Marland Kitchen  Social History Main Topics  . Smoking status: Never Smoker   . Smokeless tobacco: Never Used  . Alcohol Use: No  . Drug Use: No  . Sexual Activity: Not Currently    Birth Control/ Protection: Surgical   Other Topics Concern  . Not on file   Social History Narrative   Does not et regular exercise   Daily caffeine use          Family History  Problem Relation Age of Onset  . Pancreatic cancer Maternal Grandfather   . Heart disease Father   . Diabetes Father   . Heart disease Sister   . Diabetes Mother   . Kidney disease Mother   . Diabetes Sister   . Diabetes Paternal Grandfather   . Diabetes Paternal Grandmother   . Heart disease Paternal Uncle     Past Medical History  Diagnosis Date  . Bradycardia     Post cardioversion February, 2011,/digoxin/Cardizem metoprolol stopped, may need pacemaker  . Mitral regurgitation     Treated with repair, atrial appendage  was removed or tied off at surgery.. this was proven by TEE February, 2011  . Pulmonary HTN     55 mmHg, echo, February, 2011 / no mention of pulmonary hypertension echo, June, 2011  . Right ventricular dysfunction     Mild to moderate, echo, February, 2011 / normalized echo, June, 2011  . TR (tricuspid regurgitation)     Moderate, echo, February, 2011 / trivial, echo, June, 2011  . Mitral stenosis     Mild, February, 2011, post mitral valve repair / mild, echo, June, 2011  . CAD (coronary artery disease)     LIMA to the LAD at time of mitral valve repair / LIMA atretic,, February, 2011  . Ejection fraction < 50%     EF 30% echo and TEE diagnosis February, 2011, possibly rate related / no contraction by catheter, bradycardia so carvedilol cannot be used / EF 55-60% echo, June, 2011 / EF 50%, echo, February, 2012  . Hemorrhoids, internal   . Diverticulosis of colon   . Colon polyp, hyperplastic   . Environmental allergies   . atrial appendage     Removed were tied off at surgery, proven by TEE February, 2011  . Rash     ?Vicodin, Lotensin, Toprol  . Drug therapy     Tikosyn,  stopped 02/10/2012  . H/O amiodarone therapy     Satrted 02/12/2012  . PONV (postoperative nausea and vomiting)   . Anginal pain   . High cholesterol     takes Lipitor daily  . Asthma   . COPD (chronic obstructive pulmonary disease)   . Shortness of breath     Evaluated by Dr. Craige Cotta. Mat [id number].... bronchodilator response,,  medication started  /  patient much improved June 14, 201 2  . HTN (hypertension)     takes Mavik daily  . Myocardial infarction 04/2012  ?  Marland Kitchen Pacemaker     May 17, 2012 for  bradycardia and sick sinus syndrome  . CHF (congestive heart failure) 2011    Mild in hospital February, 2011;takes Lasix daily  . Cough     thinks may be related to [facility]   . History of bronchitis     many yrs ago  . Seasonal allergies     takes Claritin daily  . Bruises easily     takes Warfarin daily    . Hemorrhoids   . History of colon polyps   .  Hypokalemia     takes KDur daily  . Type II diabetes mellitus     takes Metformin daily  . Cataracts, bilateral     immature  . Depression     takes Effexor daily  . Atrial fibrillation     Treated with maze procedure / recurrent atrial fibrillation February, 2011... TEE cardioversion October 19, 2009  . Atrial tachycardia     February, 2011... Tikosyn started in hospital  . QT prolongation     Tikosyn and Effexor. QT prolonged October 13, 2011, peak is in dose reduced from 500  to -250 twice a day  . Sick sinus syndrome     s/p Medtronic dual chamber PPM 05/17/12  . Acquired complete AV block     AV node ablation May 28, 2012  . 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 Family history of anesthesia complication     sister hard to wake up  . Anemia     Hemoglobin 10.4, December, 2013  . GERD (gastroesophageal reflux disease)   . Arthritis     "not bad; little in my hands; some in my knees" (05/28/2012)  . Cecal cancer     Past Surgical History  Procedure Laterality Date  . Mitral valve repair  2001    "anterior and posterior leaflets" (12/04/2012)  . Maze  2001    w/ MVR & CABG  . Cardioversion  02/08/2012    Procedure: CARDIOVERSION;  Surgeon: [name], MD;  Location: [facility] OR;  Service: Cardiovascular;  Laterality: N/A;  . Cardioversion  02/29/2012    Procedure: CARDIOVERSION;  Surgeon: [name], MD;  Location: [facility] OR;  Service: Cardiovascular;  Laterality: N/A;  . Cardioversion  05/15/2012    Procedure: CARDIOVERSION;  Surgeon: [name], MD;  Location: [facility] ENDOSCOPY;  Service: Cardiovascular;  Laterality: N/A;  . Tonsillectomy and adenoidectomy  ~ 1950  . Pacemaker insertion  05/17/2012    MDT Adapta L implanted by Dr Johney Frame for tachy/brady syndrome  . Av nodal ablation  05/28/12  . Cholecystectomy  1995  . Cardiac catheterization  05/2012  . Laparoscopic right hemi colectomy  12/03/2012    Procedure:  LAPAROSCOPIC RIGHT HEMI COLECTOMY;  Surgeon: Almond Lint, MD;  Location: [facility] OR;  Service: General;;  . Umbilical hernia repair N/A 12/03/2012    Procedure: HERNIA REPAIR UMBILICAL ADULT;  Surgeon: Almond Lint, MD;  Location: [facility] OR;  Service: General;  Laterality: N/A;  . Diagnostic laparoscopic liver biopsy Left 12/03/2012    Procedure: DIAGNOSTIC LAPAROSCOPIC LIVER BIOPSY;  Surgeon: Almond Lint, MD;  Location: [facility] OR;  Service: General;  Laterality: Left;  . Breast lumpectomy Bilateral 1990's  . Abdominal hysterectomy  ?2002  . Coronary artery bypass graft  2001    CABG X1 "at time of mitral valve repair" (12/04/2012  . Insert / replace / remove pacemaker  05/17/2012    MDT Adapta L implanted by Dr Johney Frame for tachy/brady syndrome  . Maze      [facility] / Maze procedure/ atrial appendage removed were tied off    Patient Active Problem List   Diagnosis Date Noted  . Diarrhea 03/28/2013  . 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 Special screening for malignant neoplasms, colon 10/31/2012  . Anemia   . Acquired complete AV block   . Sick sinus syndrome   . Pacemaker-Medtronic 05/20/2012  . Allergic rhinitis 04/25/2012  . H/O amiodarone therapy   . Drug therapy   . QT prolongation   . Mild intermittent asthma  02/16/2011  . Shortness of breath   . Ejection fraction < 50%   . Atrial fibrillation   . Atrial tachycardia   . Bradycardia   . Mitral regurgitation   . Pulmonary HTN   . Right ventricular dysfunction   . TR (tricuspid regurgitation)   . Mitral stenosis   . CAD (coronary artery disease)   . Dizziness   . Warfarin anticoagulation   . atrial appendage   . S/P mitral valve repair   . Hx of CABG   . Rash   . DIABETES MELLITUS 03/02/2009  . HYPERLIPIDEMIA 07/07/2008  . HYPERTENSION 07/07/2008  . INTERNAL HEMORRHOIDS 07/07/2008  . COLONIC POLYPS, HYPERPLASTIC 08/04/2002  . DIVERTICULOSIS, COLON 08/04/2002    ROS   Patient denies fever, chills, headache, sweats, rash, change in  vision, change in hearing, chest pain, cough, nausea vomiting, urinary symptoms. All other systems are reviewed and are negative.  PHYSICAL EXAM  Patient is stable. She is overweight. She is oriented to person time and place. Affect is normal. There is no jugulovenous distention. Lungs are clear. Respiratory effort is nonlabored. Cardiac exam reveals S1 and S2. There no clicks or significant murmurs. Abdomen is soft. There is no peripheral edema. There are no musculoskeletal deformities. There are no skin rashes.   Filed Vitals:   04/17/13 0849  BP: 148/76  Pulse: 92  Height: 5\' 3"  (1.6 m)  Weight: 174 lb (78.926 kg)   EKG is done today and reviewed by me. There is single-chamber pacing. The resting rate today is 92.  ASSESSMENT & PLAN

## 2013-04-17 NOTE — Assessment & Plan Note (Signed)
The patient received a LIMA to the LAD at the time of her mitral valve repair. Last catheterization in February, 2011 revealed that her LIMA was atretic. However she did not have obstructive LAD disease.

## 2013-04-17 NOTE — Assessment & Plan Note (Signed)
The patient has had multiple atrial arrhythmias over time. Ultimately we were unable to control her rhythm with Tikosyn. In addition amiodarone failed to hold sinus rhythm. Eventually amiodarone was stopped. Certainly her atrial arrhythmias could play a role even though she has AV node ablation. I do not think we have any options concerning return to sinus rhythm in her case.

## 2013-04-17 NOTE — Assessment & Plan Note (Signed)
The patient had a mitral valve repair in the past. There is mild mitral stenosis by echo. It is felt this is not severe enough to be causing her problem. She is to have another 2-D echo for further assessment.

## 2013-04-17 NOTE — Assessment & Plan Note (Signed)
The patient will be having a right heart cath. This will reassess her pulmonary pressures. Historically recently, echo data has not suggested severe pulmonary hypertension.

## 2013-04-17 NOTE — Assessment & Plan Note (Addendum)
Patient remains on Coumadin because of atrial fib in her large left atrium. However we know that her left atrial appendage has been tied off. It is safe for her to hold her Coumadin as needed for procedures. Also her Coumadin can be stopped in the future if she develops an absolute contraindication to anticoagulation.  As part of today's a UA I spent well over 40 minutes under total care. More than half of this time was spent with direct contact with the patient discussing issues and explaining the situation and talking with her and her husband. Overall time spent was greater than 90 minutes with direct discussions with other physicians and extensive review of information.

## 2013-04-17 NOTE — Assessment & Plan Note (Addendum)
The patient was anemic. Diagnosis of colon cancer was made. She had successful removal of her cancer. She is also followed up with hematology and she is on iron. Hemoglobin has been stable. Hemoglobin will have to be followed carefully as we continue to aggressively evaluate her exertional shortness of breath

## 2013-04-17 NOTE — Assessment & Plan Note (Signed)
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 No chemotherapy is required. At this time there is no reason to believe that this issue plays a role with her marked exertional limitation.

## 2013-04-17 NOTE — Assessment & Plan Note (Signed)
Over time patient had bradycardia. Eventually medications were stopped and she still had bradycardia. Pacemaker was placed. September, 2013.

## 2013-04-17 NOTE — Assessment & Plan Note (Signed)
The patient's atrial appendage was either removed or tied off at the time of her mitral surgery. We have always electively continued Coumadin.

## 2013-04-17 NOTE — Patient Instructions (Addendum)
**Note De-Identified Anita Duke Obfuscation** Your physician recommends that you return for lab work on: 04/25/13.  Your physician has requested that you have a cardiac catheterization. Cardiac catheterization is used to diagnose and/or treat various heart conditions. Doctors may recommend this procedure for a number of different reasons. The most common reason is to evaluate chest pain. Chest pain can be a symptom of coronary artery disease (CAD), and cardiac catheterization can show whether plaque is narrowing or blocking your heart's arteries. This procedure is also used to evaluate the valves, as well as measure the blood flow and oxygen levels in different parts of your heart. For further information please visit [email or web address] Please follow instruction sheet, as given.   A chest x-ray takes a picture of the organs and structures inside the chest, including the heart, lungs, and blood vessels. This test can show several things, including, whether the heart is enlarges; whether fluid is building up in the lungs; and whether pacemaker / defibrillator leads are still in place.   Your physician has requested that you have an echocardiogram. Echocardiography is a painless test that uses sound waves to create images of your heart. It provides your doctor with information about the size and shape of your heart and how well your heart's chambers and valves are working. This procedure takes approximately one hour. There are no restrictions for this procedure. Please schedule Echo for next week.  Your physician recommends that you schedule a follow-up appointment in: to be determined after test.

## 2013-04-17 NOTE — Assessment & Plan Note (Signed)
Her ejection fraction has varied over time. In February, 2011 her EF was as low as 30%. 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 When this was treated she had return of ejection fraction 55% in June, 2011. Beta blocker has not been used in significant dose because of reactive airway disease. Patient does not tolerate metoprolol. Carvedilol was stopped in the past. Carvedilol could be reinstituted if it is felt to be appropriate. EF was 55% in February, 2012. Most recently the EF was 40-45%. There was septal dyssynergy. There was some hypokinesis at the apex. These findings are present since she has a pacemaker and she is pacing all the time. It is unknown if biventricular pacing would help this situation. This is being considered.

## 2013-04-17 NOTE — Assessment & Plan Note (Signed)
The patient had her mitral valve repaired in the past. She has had an excellent result. Echoes over time have not shown return of mitral regurgitation. She does have some functional mitral stenosis. This will be reassessed with her upcoming echo.

## 2013-04-18 ENCOUNTER — Ambulatory Visit (INDEPENDENT_AMBULATORY_CARE_PROVIDER_SITE_OTHER)
Admission: RE | Admit: 2013-04-18 | Discharge: 2013-04-18 | Disposition: A | Discharge status: Home / Self Care | Payer: BC Managed Care – PPO | Source: Ambulatory Visit | Attending: Cardiology | Admitting: Cardiology

## 2013-04-18 DIAGNOSIS — R0989 Other specified symptoms and signs involving the circulatory and respiratory systems: Secondary | ICD-10-CM

## 2013-04-18 DIAGNOSIS — R0609 Other forms of dyspnea: Secondary | ICD-10-CM

## 2013-04-23 ENCOUNTER — Ambulatory Visit (HOSPITAL_COMMUNITY): Payer: BC Managed Care – PPO | Attending: Cardiovascular Disease

## 2013-04-23 ENCOUNTER — Other Ambulatory Visit (HOSPITAL_COMMUNITY): Payer: BC Managed Care – PPO

## 2013-04-23 ENCOUNTER — Other Ambulatory Visit: Payer: Self-pay | Admitting: Cardiology

## 2013-04-23 ENCOUNTER — Other Ambulatory Visit (INDEPENDENT_AMBULATORY_CARE_PROVIDER_SITE_OTHER): Payer: BC Managed Care – PPO

## 2013-04-23 ENCOUNTER — Other Ambulatory Visit: Payer: BC Managed Care – PPO

## 2013-04-23 DIAGNOSIS — I471 Supraventricular tachycardia: Secondary | ICD-10-CM

## 2013-04-23 DIAGNOSIS — Z01810 Encounter for preprocedural cardiovascular examination: Secondary | ICD-10-CM

## 2013-04-23 DIAGNOSIS — R0989 Other specified symptoms and signs involving the circulatory and respiratory systems: Secondary | ICD-10-CM | POA: Insufficient documentation

## 2013-04-23 DIAGNOSIS — R0602 Shortness of breath: Secondary | ICD-10-CM

## 2013-04-23 DIAGNOSIS — E119 Type 2 diabetes mellitus without complications: Secondary | ICD-10-CM | POA: Insufficient documentation

## 2013-04-23 DIAGNOSIS — J449 Chronic obstructive pulmonary disease, unspecified: Secondary | ICD-10-CM | POA: Insufficient documentation

## 2013-04-23 DIAGNOSIS — I509 Heart failure, unspecified: Secondary | ICD-10-CM | POA: Insufficient documentation

## 2013-04-23 DIAGNOSIS — R0609 Other forms of dyspnea: Secondary | ICD-10-CM | POA: Insufficient documentation

## 2013-04-23 DIAGNOSIS — I498 Other specified cardiac arrhythmias: Secondary | ICD-10-CM

## 2013-04-23 DIAGNOSIS — I079 Rheumatic tricuspid valve disease, unspecified: Secondary | ICD-10-CM | POA: Insufficient documentation

## 2013-04-23 DIAGNOSIS — I495 Sick sinus syndrome: Secondary | ICD-10-CM | POA: Insufficient documentation

## 2013-04-23 DIAGNOSIS — I4891 Unspecified atrial fibrillation: Secondary | ICD-10-CM | POA: Insufficient documentation

## 2013-04-23 DIAGNOSIS — I4430 Unspecified atrioventricular block: Secondary | ICD-10-CM | POA: Insufficient documentation

## 2013-04-23 DIAGNOSIS — Z7901 Long term (current) use of anticoagulants: Secondary | ICD-10-CM

## 2013-04-23 DIAGNOSIS — I2510 Atherosclerotic heart disease of native coronary artery without angina pectoris: Secondary | ICD-10-CM | POA: Insufficient documentation

## 2013-04-23 DIAGNOSIS — J4489 Other specified chronic obstructive pulmonary disease: Secondary | ICD-10-CM | POA: Insufficient documentation

## 2013-04-23 DIAGNOSIS — I050 Rheumatic mitral stenosis: Secondary | ICD-10-CM | POA: Insufficient documentation

## 2013-04-23 DIAGNOSIS — I379 Nonrheumatic pulmonary valve disorder, unspecified: Secondary | ICD-10-CM | POA: Insufficient documentation

## 2013-04-23 DIAGNOSIS — Z9889 Other specified postprocedural states: Secondary | ICD-10-CM | POA: Insufficient documentation

## 2013-04-23 DIAGNOSIS — R42 Dizziness and giddiness: Secondary | ICD-10-CM | POA: Insufficient documentation

## 2013-04-23 LAB — CBC
HCT: 41.8 % (ref 36.0–46.0)
Hemoglobin: 13.8 g/dL (ref 12.0–15.0)
RBC: 5.04 Mil/uL (ref 3.87–5.11)
WBC: 6.4 10*3/uL (ref 4.5–10.5)

## 2013-04-23 LAB — BASIC METABOLIC PANEL
Chloride: 104 mEq/L (ref 96–112)
Creatinine, Ser: 0.6 mg/dL (ref 0.4–1.2)
GFR: 104.07 mL/min (ref >60.00)
Potassium: 3 mEq/L — ABNORMAL LOW (ref 3.5–5.1)

## 2013-04-23 LAB — PROTIME-INR: Prothrombin Time: 23.9 s — ABNORMAL HIGH (ref 10.2–12.4)

## 2013-04-23 NOTE — Progress Notes (Signed)
Echocardiogram performed.  

## 2013-04-24 ENCOUNTER — Ambulatory Visit: Payer: BC Managed Care – PPO | Admitting: Cardiology

## 2013-04-25 ENCOUNTER — Other Ambulatory Visit: Payer: Self-pay

## 2013-04-25 MED ORDER — POTASSIUM CHLORIDE CRYS ER 20 MEQ PO TBCR: 60.0000 meq | EXTENDED_RELEASE_TABLET | Freq: Every day | ORAL | Status: DC

## 2013-04-25 NOTE — Telephone Encounter (Signed)
Pt given lab results, she verbalized understanding. 

## 2013-04-25 NOTE — Telephone Encounter (Signed)
New problem ° ° °Pt returning your call. °

## 2013-04-26 ENCOUNTER — Encounter: Payer: Self-pay | Admitting: Cardiology

## 2013-04-28 ENCOUNTER — Encounter: Payer: Self-pay | Admitting: Cardiology

## 2013-04-28 ENCOUNTER — Other Ambulatory Visit: Payer: BC Managed Care – PPO

## 2013-04-28 ENCOUNTER — Other Ambulatory Visit (INDEPENDENT_AMBULATORY_CARE_PROVIDER_SITE_OTHER): Payer: BC Managed Care – PPO

## 2013-04-28 ENCOUNTER — Other Ambulatory Visit: Payer: Self-pay

## 2013-04-28 DIAGNOSIS — I4891 Unspecified atrial fibrillation: Secondary | ICD-10-CM

## 2013-04-28 LAB — BASIC METABOLIC PANEL
BUN: 12 mg/dL (ref 6–23)
Chloride: 105 mEq/L (ref 96–112)
GFR: 98.37 mL/min (ref >60.00)
Potassium: 4.1 mEq/L (ref 3.5–5.1)
Sodium: 138 mEq/L (ref 135–145)

## 2013-04-28 NOTE — Progress Notes (Signed)
   The patient will undergo right heart cath on April 30, 2013. I have reviewed the timing of events relative to her rhythm and her left ventricular function:   Echo, April, 2009, EF 60%  Echo, February, 2011, EF 25-30%. 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 Cardioversion,Tikosyn      kept sinus rhythm for a period of time.                                                              Catheterization .10/2009.. atretic LIMA.. no obstructive disease  Echo, February 07, 2010,   EF 55%,  rhythm probably sinus,  mean mitral gradient 3 mm mercury  Echo, February, 2012, EF 50-55%, mean mitral gradient 6 mm mercury  Echo, March, 2013, EF 50%,  mean mitral gradient 5 mm mercury    September, 2013.... unable to hold sinus rhythm when amiodarone was tried after Tikosyn.          Permanent pacemaker May 17, 2012          AV node ablation May 28, 2012  Echo, August 08, 2012, paced rhythm, EF 35-40%, mean mitral gradient 6 mm of mercury, significant septal dyssynergy and decreased motion of the inferior    Wall. Some decrease in motion at the apex,  mean mitral gradient 6 mm mercury  Echo, April 23, 2013,  EF, 35%, further decreased motion of the septum. Further decreased motion of the apex.   Overall motion definitely worse than the prior      Study. Question of visual dyssynergy of the ventricle,   mean mitral gradient 5 mm of mercury    The patient is scheduled for right heart cath April 30, 2013. As outlined above, the last catheterization was February, 2011. I will discuss with Dr. Bensimhon whether we should proceed with left heart cath also.     

## 2013-04-30 ENCOUNTER — Encounter (HOSPITAL_BASED_OUTPATIENT_CLINIC_OR_DEPARTMENT_OTHER)
Admission: RE | Disposition: A | Discharge status: Home / Self Care | Payer: BC Managed Care – PPO | Source: Ambulatory Visit | Attending: Internal Medicine

## 2013-04-30 ENCOUNTER — Inpatient Hospital Stay (HOSPITAL_BASED_OUTPATIENT_CLINIC_OR_DEPARTMENT_OTHER)
Admission: RE | Admit: 2013-04-30 | Discharge: 2013-04-30 | Disposition: A | Discharge status: Home / Self Care | Payer: BC Managed Care – PPO | Source: Ambulatory Visit | Attending: Internal Medicine | Admitting: Internal Medicine

## 2013-04-30 DIAGNOSIS — Z950 Presence of cardiac pacemaker: Secondary | ICD-10-CM | POA: Insufficient documentation

## 2013-04-30 DIAGNOSIS — R0609 Other forms of dyspnea: Secondary | ICD-10-CM | POA: Insufficient documentation

## 2013-04-30 DIAGNOSIS — I2510 Atherosclerotic heart disease of native coronary artery without angina pectoris: Secondary | ICD-10-CM

## 2013-04-30 DIAGNOSIS — R0989 Other specified symptoms and signs involving the circulatory and respiratory systems: Secondary | ICD-10-CM | POA: Insufficient documentation

## 2013-04-30 LAB — POCT I-STAT 3, VENOUS BLOOD GAS (G3P V)
Acid-base deficit: 4 mmol/L — ABNORMAL HIGH (ref 0.0–2.0)
Bicarbonate: 21.2 mEq/L (ref 20.0–24.0)
Bicarbonate: 22.5 mEq/L (ref 20.0–24.0)
O2 Saturation: 66 %
TCO2: 24 mmol/L (ref 0–100)
pH, Ven: 7.285 (ref 7.250–7.300)
pO2, Ven: 38 mmHg (ref 30.0–45.0)
pO2, Ven: 39 mmHg (ref 30.0–45.0)

## 2013-04-30 LAB — PROTIME-INR: Prothrombin Time: 19.1 seconds — ABNORMAL HIGH (ref 11.6–15.2)

## 2013-04-30 LAB — APTT: aPTT: 37 seconds (ref 24–37)

## 2013-04-30 LAB — POCT I-STAT 3, ART BLOOD GAS (G3+)
Bicarbonate: 19.8 mEq/L — ABNORMAL LOW (ref 20.0–24.0)
pH, Arterial: 7.314 — ABNORMAL LOW (ref 7.350–7.450)

## 2013-04-30 SURGERY — JV RIGHT HEART CATHETERIZATION
Anesthesia: Moderate Sedation

## 2013-04-30 MED ORDER — SODIUM CHLORIDE 0.9 % IV SOLN: INTRAVENOUS | Status: AC

## 2013-04-30 MED ORDER — ACETAMINOPHEN 325 MG PO TABS: 650.0000 mg | ORAL_TABLET | ORAL | Status: DC | PRN

## 2013-04-30 MED ORDER — ONDANSETRON HCL 4 MG/2ML IJ SOLN: 4.0000 mg | Freq: Four times a day (QID) | INTRAMUSCULAR | Status: DC | PRN

## 2013-04-30 NOTE — Interval H&P Note (Signed)
History and Physical Interval Note:  04/30/2013 9:01 AM  Anita Duke  has presented today for surgery, with the diagnosis of dyspnea  The various methods of treatment have been discussed with the patient and family. After consideration of risks, benefits and other options for treatment, the patient has consented to  Procedure(s): JV RIGHT HEART CATHETERIZATION (N/A) , LEFT HEART CATH, CORONARY ANGIOGRAPHY as a surgical intervention .  The patient's history has been reviewed, patient examined, no change in status, stable for surgery.  I have reviewed the patient's chart and labs.  Questions were answered to the patient's satisfaction.     Jones Viviani

## 2013-04-30 NOTE — H&P (View-Only) (Signed)
   The patient will undergo right heart cath on April 30, 2013. I have reviewed the timing of events relative to her rhythm and her left ventricular function:   Echo, April, 2009, EF 60%  Echo, February, 2011, EF 25-30%. 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 Cardioversion,Tikosyn      kept sinus rhythm for a period of time.                                                              Catheterization .10/2009.Marland Kitchen atretic LIMA.Marland Kitchen no obstructive disease  Echo, February 07, 2010,   EF 55%,  rhythm probably sinus,  mean mitral gradient 3 mm mercury  Echo, February, 2012, EF 50-55%, mean mitral gradient 6 mm mercury  Echo, March, 2013, EF 50%,  mean mitral gradient 5 mm mercury    September, 2013.... unable to hold sinus rhythm when amiodarone was tried after Tikosyn.          Permanent pacemaker May 17, 2012          AV node ablation May 28, 2012  Echo, August 08, 2012, paced rhythm, EF 35-40%, mean mitral gradient 6 mm of mercury, significant septal dyssynergy and decreased motion of the inferior    Wall. Some decrease in motion at the apex,  mean mitral gradient 6 mm mercury  Echo, April 23, 2013,  EF, 35%, further decreased motion of the septum. Further decreased motion of the apex.   Overall motion definitely worse than the prior      Study. Question of visual dyssynergy of the ventricle,   mean mitral gradient 5 mm of mercury    The patient is scheduled for right heart cath April 30, 2013. As outlined above, the last catheterization was February, 2011. I will discuss with Dr. Gala Romney whether we should proceed with left heart cath also.

## 2013-04-30 NOTE — OR Nursing (Signed)
Meal served 

## 2013-04-30 NOTE — CV Procedure (Signed)
Cardiac Cath Procedure Note  Indication: HF, severe DOE  Procedures performed:  1) Right heart cathererization 2) Selective coronary angiography 3) Left heart catheterization 4) Left ventriculogram 5) LIMA angiography  Description of procedure:     The risks and indication of the procedure were explained. Consent was signed and placed on the chart. An appropriate timeout was taken prior to the procedure. The right groin was prepped and draped in the routine sterile fashion and anesthetized with 1% local lidocaine.   A 5 FR arterial sheath was placed in the right femoral artery using a modified Seldinger technique. Standard catheters including a JL4, JR4 and angled pigtail were used. All catheter exchanges were made over a wire. A 7 FR venous sheath was placed in the right femoral vein using a modified Seldinger technique. A standard Swan-Ganz catheter was used for the procedure.   Complications:  None apparent  Findings:  RA = 4 RV =  31/2/5 PA =  33/9 (19) PCW = 8 Fick cardiac output/index = 4.8/2.6 Thermo CO/CI = 3.3/1.8 PVR = 1.5 Woods SVR = 1,330 FA sat = 92% PA sat = 66%, 67% MV gradient = 7.5 mm Hg MVA = 1.9 cm2  Ao Pressure: 108/46 (69) LV Pressure: 109/4/7 There was no signficant gradient across the aortic valve on pullback.  Left main: Normal  LAD: Large vessel gives off large first diagonal. In proximal to mid LAD at bifurcation with the diagonal there is 50-60% lesion. Otherwise normal  LCX: Large vessel. normal  RCA: Large dominant vessel. normal  LV-gram done in the RAO projection: Ejection fraction = 25% with significant dyssynchrony. No obvious MR  LIMA to LAD: atretic  Assessment: 1. Moderate 1v CAD 2. Severe LV dysfunction with evidence of dyssynchrony 3. Normal filling pressures without PAH 4. Mild to moderate mitral stenosis  Plan/Discussion:  I think main issue is heart failure due to low EF and ventricular dyssynchrony. She is s/p AV  node ablation and is currently RV pacing. Would recommend consideration of CRT upgrade. If remains symptomatic can consider trial of inotropic support. D/w Dr. Myrtis Ser.  Arvilla Meres, MD 10:09 AM

## 2013-04-30 NOTE — OR Nursing (Signed)
Discharge instructions reviewed, pt stated understanding, ambulated in hall without difficulty, site level 0, transported to husband's car via wheelchair

## 2013-04-30 NOTE — OR Nursing (Signed)
Tegaderm dressing applied, site level 0, bedrest begins at 1025 

## 2013-05-01 ENCOUNTER — Telehealth: Payer: Self-pay | Admitting: *Deleted

## 2013-05-01 ENCOUNTER — Telehealth: Payer: Self-pay | Admitting: Pharmacist

## 2013-05-01 NOTE — Telephone Encounter (Signed)
Pt called and instructions given see telephone note

## 2013-05-01 NOTE — Telephone Encounter (Signed)
New Problem   Pt states she received a heart cath and she withheld her coumadin for 4 days and is not sure when she should start back.

## 2013-05-01 NOTE — Telephone Encounter (Signed)
Pt instructed to restart coumadin today at same dose and take extra 1/2 tablet for next 2 days Has appt to be seen in clinic Sept 17th and pt states understanding.

## 2013-05-06 ENCOUNTER — Telehealth: Payer: Self-pay

## 2013-05-06 NOTE — Telephone Encounter (Signed)
Pt had cath on 8/27 and is scheduled to f/u with Norma Fredrickson, NP on 9/17 (this is our first available appointment).  She states that she was told after her cath that she may need a different type of device (PPM) and she wants to proceed quickly as the pt states that her husband has a lot of medical appointments coming up soon so she wants to f/u sooner than 9/17 so her appointments and his do not overlap. She wants to know if she can see Dr Myrtis Ser sooner. Please advise.

## 2013-05-08 ENCOUNTER — Encounter: Payer: Self-pay | Admitting: Cardiology

## 2013-05-08 NOTE — Telephone Encounter (Signed)
I have personally called the patient. She knows that I will call her back with further plans. She does not need to be seen by our extender team. She needs to keep the appointment with Dr. Johney Frame.

## 2013-05-08 NOTE — Progress Notes (Signed)
   The patient has had her complete right and left heart cath. I have reviewed all the data with Dr. Gala Romney. I will talk with Dr. Johney Frame about the possibility of proceeding with biventricular pacing. Within the recent weeks the patient has learned that her husband has metastatic cancer. He is getting radiation and chemotherapy starting this week. I called and spoke with the patient today about all of these issues. We will keep all of these in mind as we consider if and when we can proceed with biventricular pacing. I will keep her informed.

## 2013-05-20 ENCOUNTER — Encounter: Payer: BC Managed Care – PPO | Admitting: Nurse Practitioner

## 2013-05-21 ENCOUNTER — Ambulatory Visit (INDEPENDENT_AMBULATORY_CARE_PROVIDER_SITE_OTHER): Payer: BC Managed Care – PPO | Admitting: Internal Medicine

## 2013-05-21 ENCOUNTER — Ambulatory Visit (INDEPENDENT_AMBULATORY_CARE_PROVIDER_SITE_OTHER): Payer: BC Managed Care – PPO | Admitting: *Deleted

## 2013-05-21 ENCOUNTER — Encounter: Payer: Self-pay | Admitting: Internal Medicine

## 2013-05-21 ENCOUNTER — Encounter: Payer: BC Managed Care – PPO | Admitting: Nurse Practitioner

## 2013-05-21 VITALS — BP 144/86 | HR 89 | Ht 63.0 in | Wt 174.0 lb

## 2013-05-21 DIAGNOSIS — I519 Heart disease, unspecified: Secondary | ICD-10-CM

## 2013-05-21 DIAGNOSIS — Z7901 Long term (current) use of anticoagulants: Secondary | ICD-10-CM

## 2013-05-21 DIAGNOSIS — I10 Essential (primary) hypertension: Secondary | ICD-10-CM

## 2013-05-21 DIAGNOSIS — I442 Atrioventricular block, complete: Secondary | ICD-10-CM

## 2013-05-21 DIAGNOSIS — I4891 Unspecified atrial fibrillation: Secondary | ICD-10-CM

## 2013-05-21 DIAGNOSIS — Z8679 Personal history of other diseases of the circulatory system: Secondary | ICD-10-CM

## 2013-05-21 DIAGNOSIS — I2510 Atherosclerotic heart disease of native coronary artery without angina pectoris: Secondary | ICD-10-CM

## 2013-05-21 DIAGNOSIS — Z950 Presence of cardiac pacemaker: Secondary | ICD-10-CM

## 2013-05-21 HISTORY — DX: Heart disease, unspecified: I51.9

## 2013-05-21 LAB — PACEMAKER DEVICE OBSERVATION
BMOD-0003RV: 30
RV LEAD IMPEDENCE PM: 688 Ohm
RV LEAD THRESHOLD: 0.75 V
VENTRICULAR PACING PM: 98

## 2013-05-21 NOTE — Patient Instructions (Signed)
Will have a Bi-V PPM upgrade in Nov

## 2013-05-21 NOTE — Progress Notes (Signed)
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 female who presents today for routine electrophysiology followup.  She continues to struggle with CHF.  She has been followed closely by Dr Myrtis Ser and recently underwent evaluation by Dr Gala Romney (reviewed today).  Today, she denies symptoms of palpitations, chest pain, lower extremity edema, dizziness, presyncope, or syncope.  The patient is otherwise without complaint today.   Past Medical History  Diagnosis Date  . Bradycardia     Post cardioversion February, 2011,/digoxin/Cardizem metoprolol stopped, may need pacemaker  . Mitral regurgitation     Treated with repair, atrial appendage was removed or tied off at surgery.. this was proven by TEE February, 2011  . Pulmonary HTN     55 mmHg, echo, February, 2011 / no mention of pulmonary hypertension echo, June, 2011  . Right ventricular dysfunction     Mild to moderate, echo, February, 2011 / normalized echo, June, 2011  . TR (tricuspid regurgitation)     Moderate, echo, February, 2011 / trivial, echo, June, 2011  . Mitral stenosis     Mild, February, 2011, post mitral valve repair / mild, echo, June, 2011  . CAD (coronary artery disease)     LIMA to the LAD at time of mitral valve repair / LIMA atretic,, February, 2011  . Ejection fraction < 50%     EF 30% echo and TEE diagnosis February, 2011, possibly rate related / no contraction by catheter, bradycardia so carvedilol cannot be used / EF 55-60% echo, June, 2011 / EF 50%, echo, February, 2012  . Hemorrhoids, internal   . Diverticulosis of colon   . Colon polyp, hyperplastic   . Environmental allergies   . atrial appendage     Removed were tied off at surgery, proven by TEE February, 2011  . Rash     ?Vicodin, Lotensin, Toprol  . Drug therapy     Tikosyn,  stopped 02/10/2012  . H/O amiodarone therapy     Satrted 02/12/2012  . PONV (postoperative nausea and vomiting)   . Anginal pain   . High  cholesterol     takes Lipitor daily  . Asthma   . COPD (chronic obstructive pulmonary disease)   . Shortness of breath     Evaluated by Dr. Craige Cotta. Mat [id number].... bronchodilator response,,  medication started  /  patient much improved June 14, 201 2  . HTN (hypertension)     takes Mavik daily  . Myocardial infarction 04/2012  ?  Marland Kitchen Pacemaker     May 17, 2012 for  bradycardia and sick sinus syndrome  . CHF (congestive heart failure) 2011    Mild in hospital February, 2011;takes Lasix daily  . Cough     thinks may be related to [facility]   . History of bronchitis     many yrs ago  . Seasonal allergies     takes Claritin daily  . Bruises easily     takes Warfarin daily  . Hemorrhoids   . History of colon polyps   . Hypokalemia     takes KDur daily  . Type II diabetes mellitus     takes Metformin daily  . Cataracts, bilateral     immature  . Depression     takes Effexor daily  . Atrial fibrillation     Treated with maze procedure / recurrent atrial fibrillation February, 2011... TEE cardioversion October 19, 2009  . Atrial tachycardia     February,  2011... Tikosyn started in hospital  . QT prolongation     Tikosyn and Effexor. QT prolonged October 13, 2011, peak is in dose reduced from 500  to -250 twice a day  . Sick sinus syndrome     s/p Medtronic dual chamber PPM 05/17/12  . Acquired complete AV block     AV node ablation May 28, 2012  . 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 Family history of anesthesia complication     sister hard to wake up  . Anemia     Hemoglobin 10.4, December, 2013  . GERD (gastroesophageal reflux disease)   . Arthritis     "not bad; little in my hands; some in my knees" (05/28/2012)  . Cecal cancer    Past Surgical History  Procedure Laterality Date  . Mitral valve repair  2001    "anterior and posterior leaflets" (12/04/2012)  . Maze  2001    w/ MVR & CABG  . Cardioversion  02/08/2012    Procedure: CARDIOVERSION;  Surgeon:  [name], MD;  Location: [facility] OR;  Service: Cardiovascular;  Laterality: N/A;  . Cardioversion  02/29/2012    Procedure: CARDIOVERSION;  Surgeon: [name], MD;  Location: [facility] OR;  Service: Cardiovascular;  Laterality: N/A;  . Cardioversion  05/15/2012    Procedure: CARDIOVERSION;  Surgeon: [name], MD;  Location: [facility] ENDOSCOPY;  Service: Cardiovascular;  Laterality: N/A;  . Tonsillectomy and adenoidectomy  ~ 1950  . Pacemaker insertion  05/17/2012    MDT Adapta L implanted by Dr Johney Frame for tachy/brady syndrome  . Av nodal ablation  05/28/12  . Cholecystectomy  1995  . Cardiac catheterization  05/2012  . Laparoscopic right hemi colectomy  12/03/2012    Procedure: LAPAROSCOPIC RIGHT HEMI COLECTOMY;  Surgeon: Almond Lint, MD;  Location: [facility] OR;  Service: General;;  . Umbilical hernia repair N/A 12/03/2012    Procedure: HERNIA REPAIR UMBILICAL ADULT;  Surgeon: Almond Lint, MD;  Location: [facility] OR;  Service: General;  Laterality: N/A;  . Diagnostic laparoscopic liver biopsy Left 12/03/2012    Procedure: DIAGNOSTIC LAPAROSCOPIC LIVER BIOPSY;  Surgeon: Almond Lint, MD;  Location: [facility] OR;  Service: General;  Laterality: Left;  . Breast lumpectomy Bilateral 1990's  . Abdominal hysterectomy  ?2002  . Coronary artery bypass graft  2001    CABG X1 "at time of mitral valve repair" (12/04/2012  . Insert / replace / remove pacemaker  05/17/2012    MDT Adapta L implanted by Dr Johney Frame for tachy/brady syndrome  . Maze      [facility] / Maze procedure/ atrial appendage removed were tied off    Current Outpatient Prescriptions  Medication Sig Dispense Refill  . acetaminophen (TYLENOL) 500 MG tablet Take 1,000 mg by mouth every 6 (six) hours as needed. For pain      . aspirin 81 MG tablet Take 81 mg by mouth daily.        Marland Kitchen atorvastatin (LIPITOR) 40 MG tablet Take 1 tablet (40 mg total) by mouth daily.  90 tablet  3  . budesonide-formoterol (SYMBICORT) 160-4.5 MCG/ACT inhaler Inhale 2 puffs into the lungs  2 (two) times daily.      . calcium carbonate (OS-CAL) 600 MG TABS Take 600 mg by mouth daily.       . carvedilol (COREG) 3.125 MG tablet Take 1 tablet (3.125 mg total) by mouth 2 (two) times daily with a meal.  180 tablet  3  . cholecalciferol (VITAMIN D) 1000 UNITS tablet  Take 4,000 Units by mouth daily.      . fish oil-omega-3 fatty acids 1000 MG capsule Take 1 g by mouth 2 (two) times daily.       . furosemide (LASIX) 40 MG tablet Take 0.5 tablets (20 mg total) by mouth daily.  90 tablet  3  . iron polysaccharides (NIFEREX) 150 MG capsule Take 1 capsule (150 mg total) by mouth 2 (two) times daily.  100 capsule  6  . loperamide (IMODIUM) 2 MG capsule Take 1 capsule (2 mg total) by mouth 2 (two) times daily before lunch and supper.  60 capsule  0  . loratadine (CLARITIN) 10 MG tablet Take 10 mg by mouth daily.      . metFORMIN (GLUCOPHAGE) 500 MG tablet Take 500 mg by mouth daily.       . potassium chloride SA (KLOR-CON M20) 20 MEQ tablet Take 3 tablets (60 mEq total) by mouth daily.  90 tablet  3  . trandolapril (MAVIK) 4 MG tablet Take 8 mg by mouth daily.       Marland Kitchen venlafaxine (EFFEXOR) 37.5 MG tablet Take 37.5 mg by mouth daily.       Marland Kitchen warfarin (COUMADIN) 2.5 MG tablet Take as directed by anticoagulation clinic  40 tablet  3   No current facility-administered medications for this visit.    Physical Exam: Filed Vitals:   05/21/13 1238  BP: 144/86  Pulse: 89  Height: 5\' 3"  (1.6 m)  Weight: 174 lb (78.926 kg)    GEN- The patient is alert and oriented x 3 today.  Overall I think she appears less ill than previously Head- normocephalic, atraumatic Eyes-  Sclera clear, conjunctiva pink Ears- hearing intact Oropharynx- clear Lungs- Clear to ausculation bilaterally, normal work of breathing Chest- pacemaker pocket is well healed Heart- Regular rate and rhythm (paced) GI- soft, NT, ND, + BS Extremities- no clubbing, cyanosis, or edema  Pacemaker interrogation- reviewed in detail  today,  See PACEART report Echo 04/23/13 is reviewed today, EF 35-40%  Assessment and Plan:  1. Chronic systolic dysfunction The patient has EF 35-40%, NYHA Class III CHF, and is 100% V paced.  I agree with Drs Myrtis Ser and Bensimhon that V pacing may be contributing to her CHF symptoms.  I agree that upgrade to a CRT-P device is prudent at this time. Risks, benefits, alternatives to pacemaker upgrade to CRT-P were discussed in detail with the patient today. The patient understands that the risks include but are not limited to bleeding, infection, pneumothorax, perforation, tamponade, vascular damage, renal failure, MI, stroke, death,  and lead dislodgement and wishes to proceed. We will therefore schedule the procedure at the next available time.  As her husband is receiving daily chemotherapy, she would prefer to wait in November to proceed.  2. Permanent afib Continue long term anticoagulation  3. Complete heart block Normal pacemaker function See Pace Art report No changes today Plans for upgrade as above  4. CAD Stable No change required today

## 2013-05-30 ENCOUNTER — Telehealth: Payer: Self-pay

## 2013-05-30 NOTE — Telephone Encounter (Signed)
Spoke with patient and we are setting her up 07/15/13.  She is aware and I will call her next week with instructions

## 2013-05-30 NOTE — Telephone Encounter (Signed)
Pt wants someone to call her concerning surgery for upgrade of her pacemaker. She states that Dr Johney Frame said someone from this office would be contacting her but she has not heard anything since. Please call pt.

## 2013-06-10 ENCOUNTER — Encounter: Payer: Self-pay | Admitting: Internal Medicine

## 2013-06-13 ENCOUNTER — Ambulatory Visit (INDEPENDENT_AMBULATORY_CARE_PROVIDER_SITE_OTHER): Payer: Medicare Other | Admitting: *Deleted

## 2013-06-13 DIAGNOSIS — Z7901 Long term (current) use of anticoagulants: Secondary | ICD-10-CM

## 2013-06-13 DIAGNOSIS — Z8679 Personal history of other diseases of the circulatory system: Secondary | ICD-10-CM

## 2013-06-13 DIAGNOSIS — I4891 Unspecified atrial fibrillation: Secondary | ICD-10-CM

## 2013-06-13 LAB — POCT INR: INR: 4

## 2013-06-27 ENCOUNTER — Ambulatory Visit (INDEPENDENT_AMBULATORY_CARE_PROVIDER_SITE_OTHER): Payer: Medicare Other | Admitting: Pharmacist

## 2013-06-27 ENCOUNTER — Other Ambulatory Visit: Payer: Self-pay

## 2013-06-27 ENCOUNTER — Other Ambulatory Visit: Payer: Self-pay | Admitting: *Deleted

## 2013-06-27 DIAGNOSIS — Z8679 Personal history of other diseases of the circulatory system: Secondary | ICD-10-CM

## 2013-06-27 DIAGNOSIS — Z7901 Long term (current) use of anticoagulants: Secondary | ICD-10-CM

## 2013-06-27 DIAGNOSIS — I4891 Unspecified atrial fibrillation: Secondary | ICD-10-CM

## 2013-06-27 LAB — POCT INR: INR: 3.3

## 2013-06-27 MED ORDER — CARVEDILOL 3.125 MG PO TABS: 3.1250 mg | ORAL_TABLET | Freq: Two times a day (BID) | ORAL | Status: DC

## 2013-06-27 MED ORDER — WARFARIN SODIUM 2.5 MG PO TABS: ORAL_TABLET | ORAL | Status: DC

## 2013-07-03 ENCOUNTER — Encounter: Payer: Self-pay | Admitting: *Deleted

## 2013-07-04 ENCOUNTER — Telehealth: Payer: Self-pay

## 2013-07-04 NOTE — Telephone Encounter (Deleted)
**Note De-Identified Anita Duke Obfuscation** The pt states that she cannot have

## 2013-07-08 ENCOUNTER — Other Ambulatory Visit: Payer: Medicare Other

## 2013-07-10 ENCOUNTER — Other Ambulatory Visit: Payer: Self-pay

## 2013-07-15 ENCOUNTER — Ambulatory Visit (HOSPITAL_COMMUNITY): Admission: RE | Admit: 2013-07-15 | Payer: Medicare Other | Source: Ambulatory Visit | Admitting: Internal Medicine

## 2013-07-15 ENCOUNTER — Encounter (HOSPITAL_COMMUNITY): Admission: RE | Payer: Self-pay | Source: Ambulatory Visit

## 2013-07-15 SURGERY — BI-VENTRICULAR PACEMAKER UPGRADE
Anesthesia: LOCAL

## 2013-07-17 ENCOUNTER — Ambulatory Visit (INDEPENDENT_AMBULATORY_CARE_PROVIDER_SITE_OTHER): Payer: Medicare Other | Admitting: *Deleted

## 2013-07-17 DIAGNOSIS — I4891 Unspecified atrial fibrillation: Secondary | ICD-10-CM

## 2013-07-17 DIAGNOSIS — Z7901 Long term (current) use of anticoagulants: Secondary | ICD-10-CM

## 2013-07-17 DIAGNOSIS — Z8679 Personal history of other diseases of the circulatory system: Secondary | ICD-10-CM

## 2013-07-30 ENCOUNTER — Ambulatory Visit (INDEPENDENT_AMBULATORY_CARE_PROVIDER_SITE_OTHER): Payer: Medicare Other | Admitting: *Deleted

## 2013-07-30 DIAGNOSIS — I4891 Unspecified atrial fibrillation: Secondary | ICD-10-CM

## 2013-07-30 DIAGNOSIS — Z7901 Long term (current) use of anticoagulants: Secondary | ICD-10-CM

## 2013-07-30 DIAGNOSIS — Z8679 Personal history of other diseases of the circulatory system: Secondary | ICD-10-CM

## 2013-08-08 ENCOUNTER — Ambulatory Visit (INDEPENDENT_AMBULATORY_CARE_PROVIDER_SITE_OTHER): Payer: BC Managed Care – PPO | Admitting: General Surgery

## 2013-08-20 ENCOUNTER — Ambulatory Visit (INDEPENDENT_AMBULATORY_CARE_PROVIDER_SITE_OTHER): Payer: Medicare Other | Admitting: *Deleted

## 2013-08-20 DIAGNOSIS — Z7901 Long term (current) use of anticoagulants: Secondary | ICD-10-CM

## 2013-08-20 DIAGNOSIS — I4891 Unspecified atrial fibrillation: Secondary | ICD-10-CM

## 2013-08-20 DIAGNOSIS — Z8679 Personal history of other diseases of the circulatory system: Secondary | ICD-10-CM

## 2013-08-21 ENCOUNTER — Encounter: Payer: Self-pay | Admitting: Internal Medicine

## 2013-08-22 ENCOUNTER — Encounter: Payer: Self-pay | Admitting: Internal Medicine

## 2013-08-22 ENCOUNTER — Ambulatory Visit (INDEPENDENT_AMBULATORY_CARE_PROVIDER_SITE_OTHER): Payer: Medicare Other | Admitting: Internal Medicine

## 2013-08-22 VITALS — BP 128/70 | HR 92 | Temp 97.5°F | Resp 16 | Ht 63.5 in | Wt 172.8 lb

## 2013-08-22 DIAGNOSIS — E782 Mixed hyperlipidemia: Secondary | ICD-10-CM

## 2013-08-22 DIAGNOSIS — Z0000 Encounter for general adult medical examination without abnormal findings: Secondary | ICD-10-CM

## 2013-08-22 DIAGNOSIS — Z79899 Other long term (current) drug therapy: Secondary | ICD-10-CM

## 2013-08-22 DIAGNOSIS — E119 Type 2 diabetes mellitus without complications: Secondary | ICD-10-CM

## 2013-08-22 DIAGNOSIS — E785 Hyperlipidemia, unspecified: Secondary | ICD-10-CM

## 2013-08-22 DIAGNOSIS — I10 Essential (primary) hypertension: Secondary | ICD-10-CM

## 2013-08-22 DIAGNOSIS — E559 Vitamin D deficiency, unspecified: Secondary | ICD-10-CM

## 2013-08-22 DIAGNOSIS — Z1212 Encounter for screening for malignant neoplasm of rectum: Secondary | ICD-10-CM

## 2013-08-22 LAB — CBC WITH DIFFERENTIAL/PLATELET
Basophils Relative: 1 % (ref 0–1)
Eosinophils Absolute: 0.1 10*3/uL (ref 0.0–0.7)
Eosinophils Relative: 2 % (ref 0–5)
MCH: 29.5 pg (ref 26.0–34.0)
MCHC: 34 g/dL (ref 30.0–36.0)
MCV: 86.7 fL (ref 78.0–100.0)
Neutrophils Relative %: 55 % (ref 43–77)
Platelets: 262 10*3/uL (ref 150–400)
RBC: 5.12 MIL/uL — ABNORMAL HIGH (ref 3.87–5.11)
RDW: 13.6 % (ref 11.5–15.5)

## 2013-08-22 LAB — HEMOGLOBIN A1C: Hgb A1c MFr Bld: 6.6 % — ABNORMAL HIGH (ref <5.7)

## 2013-08-22 MED ORDER — METFORMIN HCL 500 MG PO TABS: 1000.0000 mg | ORAL_TABLET | Freq: Two times a day (BID) | ORAL | Status: DC

## 2013-08-22 NOTE — Progress Notes (Signed)
Patient ID: Anita Duke, female   DOB: [birth year], 72 y.o.   MRN: [id number]  Annual Screening Comprehensive Examination  This very nice 72 y.o. female presents for complete physical.  Patient has been followed for HTN, ASHD s/p CABG/MiVR w/BiV pacer and is on anti-coagulation followed at the [facility] coumadin clinic,  T2 DM, Hyperlipidemia, and Vitamin D Deficiency.   Patient's BP has been controlled at home. Today's BP is 128/70. She is followed by Dr Myrtis Ser for  ASCAD and is S/P CABD and MiVR (2001) . Also patient has Hx/o SSS and pAfib and had AV node ablation and Cardiac Pacemaker (05/2012) . She is also known to have Pulmonary HTN followed by Dr Craige Cotta. Patient denies any current chest pain, palpitations, dizziness or ankle swelling, but does report shortness of breath with minimal activity or walking short distances.   Patient's hyperlipidemia is controlled with diet and medications. Patient denies myalgias or other medication SE's. Last cholesterol last visit was 128 , triglycerides 104, HDL 56 and LDL 51 in September - all at goal.     Patient has T2 diabetes since 2005 with last A1c 6.9% in September. She admits very infrequent CBG monitoring. Patient denies reactive hypoglycemic symptoms, visual blurring, diabetic polys, or paresthesias.    In March patient had colonoscopy by Dr Arlyce Dice followed April with a Rt Hemicolectomy (Dr Donell Beers) for cecal cancer.    Finally, patient has history of Vitamin D Deficiency with last vitamin D 69 in September. Patient also relates being under a fair amount of personalvstress with her husband under going radiation and chemotherapy treatments for an apparently non responding unknown primary widely metastatic cancer.     Current Outpatient Prescriptions on File Prior to Visit  Medication Sig Dispense Refill  . acetaminophen (TYLENOL) 500 MG tablet Take 1,000 mg by mouth every 6 (six) hours as needed. For pain      . aspirin 81 MG tablet Take 81 mg by  mouth daily.        Marland Kitchen atorvastatin (LIPITOR) 40 MG tablet Take 1 tablet (40 mg total) by mouth daily.  90 tablet  3  . budesonide-formoterol (SYMBICORT) 160-4.5 MCG/ACT inhaler Inhale 2 puffs into the lungs 2 (two) times daily. prn      . calcium carbonate (OS-CAL) 600 MG TABS Take 600 mg by mouth daily.       . carvedilol (COREG) 3.125 MG tablet Take 1 tablet (3.125 mg total) by mouth 2 (two) times daily with a meal.  180 tablet  3  . cholecalciferol (VITAMIN D) 1000 UNITS tablet Take 4,000 Units by mouth daily.      . fish oil-omega-3 fatty acids 1000 MG capsule Take 1 g by mouth 2 (two) times daily.       . furosemide (LASIX) 40 MG tablet Take 0.5 tablets (20 mg total) by mouth daily.  90 tablet  3  . loratadine (CLARITIN) 10 MG tablet Take 10 mg by mouth daily.      . metFORMIN (GLUCOPHAGE) 500 MG tablet Take 500 mg by mouth 2 (two) times daily with a meal.       . potassium chloride SA (KLOR-CON M20) 20 MEQ tablet Take 3 tablets (60 mEq total) by mouth daily.  90 tablet  3  . trandolapril (MAVIK) 4 MG tablet Take 8 mg by mouth daily.       Marland Kitchen venlafaxine (EFFEXOR) 37.5 MG tablet Take 37.5 mg by mouth daily.       Marland Kitchen warfarin (COUMADIN)  2.5 MG tablet Take as directed by anticoagulation clinic  120 tablet  1   No current facility-administered medications on file prior to visit.    Allergies  Allergen Reactions  . Demerol [Meperidine] Nausea And Vomiting  . Morphine Other (See Comments)    vomiting  . Tetanus Toxoid Rash    "years ago"    Past Medical History  Diagnosis Date  . Bradycardia     Post cardioversion February, 2011,/digoxin/Cardizem metoprolol stopped, may need pacemaker  . Mitral regurgitation     Treated with repair, atrial appendage was removed or tied off at surgery.. this was proven by TEE February, 2011  . Pulmonary HTN     55 mmHg, echo, February, 2011 / no mention of pulmonary hypertension echo, June, 2011  . Right ventricular dysfunction     Mild to moderate,  echo, February, 2011 / normalized echo, June, 2011  . TR (tricuspid regurgitation)     Moderate, echo, February, 2011 / trivial, echo, June, 2011  . Mitral stenosis     Mild, February, 2011, post mitral valve repair / mild, echo, June, 2011  . CAD (coronary artery disease)     LIMA to the LAD at time of mitral valve repair / LIMA atretic,, February, 2011  . Ejection fraction < 50%     EF 30% echo and TEE diagnosis February, 2011, possibly rate related / no contraction by catheter, bradycardia so carvedilol cannot be used / EF 55-60% echo, June, 2011 / EF 50%, echo, February, 2012  . Hemorrhoids, internal   . Diverticulosis of colon   . Colon polyp, hyperplastic   . Environmental allergies   . atrial appendage     Removed were tied off at surgery, proven by TEE February, 2011  . Rash     ?Vicodin, Lotensin, Toprol  . Drug therapy     Tikosyn,  stopped 02/10/2012  . H/O amiodarone therapy     Satrted 02/12/2012  . PONV (postoperative nausea and vomiting)   . Anginal pain   . High cholesterol     takes Lipitor daily  . Asthma   . COPD (chronic obstructive pulmonary disease)   . Shortness of breath     Evaluated by Dr. Craige Cotta. Mat [id number].... bronchodilator response,,  medication started  /  patient much improved June 14, 201 2  . HTN (hypertension)     takes Mavik daily  . Myocardial infarction 04/2012  ?  Marland Kitchen Pacemaker     May 17, 2012 for  bradycardia and sick sinus syndrome  . CHF (congestive heart failure) 2011    Mild in hospital February, 2011;takes Lasix daily  . Cough     thinks may be related to [facility]   . History of bronchitis     many yrs ago  . Seasonal allergies     takes Claritin daily  . Bruises easily     takes Warfarin daily  . Hemorrhoids   . History of colon polyps   . Hypokalemia     takes KDur daily  . Type II diabetes mellitus     takes Metformin daily  . Cataracts, bilateral     immature  . Depression     takes Effexor daily  . Atrial  fibrillation     Treated with maze procedure / recurrent atrial fibrillation February, 2011... TEE cardioversion October 19, 2009  . Atrial tachycardia     February, 2011... Tikosyn started in hospital  . QT prolongation  Tikosyn and Effexor. QT prolonged October 13, 2011, peak is in dose reduced from 500  to -250 twice a day  . Sick sinus syndrome     s/p Medtronic dual chamber PPM 05/17/12  . Acquired complete AV block     AV node ablation May 28, 2012  . 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 Family history of anesthesia complication     sister hard to wake up  . Anemia     Hemoglobin 10.4, December, 2013  . GERD (gastroesophageal reflux disease)   . Arthritis     "not bad; little in my hands; some in my knees" (05/28/2012)  . Cecal cancer     Past Surgical History  Procedure Laterality Date  . Mitral valve repair  2001    "anterior and posterior leaflets" (12/04/2012)  . Maze  2001    w/ MVR & CABG  . Cardioversion  02/08/2012    Procedure: CARDIOVERSION;  Surgeon: [name], MD;  Location: [facility] OR;  Service: Cardiovascular;  Laterality: N/A;  . Cardioversion  02/29/2012    Procedure: CARDIOVERSION;  Surgeon: [name], MD;  Location: [facility] OR;  Service: Cardiovascular;  Laterality: N/A;  . Cardioversion  05/15/2012    Procedure: CARDIOVERSION;  Surgeon: [name], MD;  Location: [facility] ENDOSCOPY;  Service: Cardiovascular;  Laterality: N/A;  . Tonsillectomy and adenoidectomy  ~ 1950  . Pacemaker insertion  05/17/2012    MDT Adapta L implanted by Dr Johney Frame for tachy/brady syndrome  . Av nodal ablation  05/28/12  . Cardiac catheterization  05/2012  . Laparoscopic right hemi colectomy  12/03/2012    Procedure: LAPAROSCOPIC RIGHT HEMI COLECTOMY;  Surgeon: Almond Lint, MD;  Location: [facility] OR;  Service: General;;  . Umbilical hernia repair N/A 12/03/2012    Procedure: HERNIA REPAIR UMBILICAL ADULT;  Surgeon: Almond Lint, MD;  Location: [facility] OR;  Service: General;   Laterality: N/A;  . Diagnostic laparoscopic liver biopsy Left 12/03/2012    Procedure: DIAGNOSTIC LAPAROSCOPIC LIVER BIOPSY;  Surgeon: Almond Lint, MD;  Location: [facility] OR;  Service: General;  Laterality: Left;  . Insert / replace / remove pacemaker  05/17/2012    MDT Adapta L implanted by Dr Johney Frame for tachy/brady syndrome  . Maze      [facility] / Maze procedure/ atrial appendage removed were tied off  . Coronary artery bypass graft  2001    CABG X1 "at time of mitral valve repair" (12/04/2012  . Breast lumpectomy Bilateral 1990's  . Cholecystectomy  1995  . Colon surgery    . Abdominal hysterectomy  ?2002    Family History  Problem Relation Age of Onset  . Pancreatic cancer Maternal Grandfather   . Heart disease Father   . Diabetes Father   . Heart disease Sister   . Diabetes Mother   . Kidney disease Mother   . Diabetes Sister   . Diabetes Paternal Grandfather   . Diabetes Paternal Grandmother   . Heart disease Paternal Uncle     History  Substance Use Topics  . Smoking status: Never Smoker   . Smokeless tobacco: Never Used  . Alcohol Use: No    ROS Constitutional: Denies fever, chills, weight loss/gain, headaches, insomnia, fatigue, night sweats, and change in appetite. Eyes: Denies redness, blurred vision, diplopia, discharge, itchy, watery eyes.  ENT: Denies discharge, congestion, post nasal drip, epistaxis, sore throat, earache, hearing loss, dental pain, Tinnitus, Vertigo, Sinus pain, snoring.  Cardio: Denies chest pain, palpitations, irregular heartbeat, syncope, dyspnea,  diaphoresis, orthopnea, PND, claudication, edema Respiratory: denies cough, dyspnea, DOE, pleurisy, hoarseness, laryngitis, wheezing.  Gastrointestinal: Denies dysphagia, heartburn, reflux, water brash, pain, cramps, nausea, vomiting, bloating, diarrhea, constipation, hematemesis, melena, hematochezia, jaundice, hemorrhoids Genitourinary: Denies dysuria, frequency, urgency, nocturia, hesitancy,  discharge, hematuria, flank pain Breast:Breast lumps, nipple discharge, bleeding.  Musculoskeletal: Denies arthralgia, myalgia, stiffness, Jt. Swelling, pain, limp, and strain/sprain. Skin: Denies puritis, rash, hives, warts, acne, eczema, changing in skin lesion Neuro: No weakness, tremor, incoordination, spasms, paresthesia, pain Psychiatric: Denies confusion, memory loss, sensory loss Endocrine: Denies change in weight, skin, hair change, nocturia, and paresthesia, diabetic polys, visual blurring, hyper / hypo glycemic episodes.  Heme/Lymph: No excessive bleeding, bruising, enlarged lymph nodes.  Filed Vitals:   08/22/13 1109  BP: 128/70  Pulse: 92  Temp: 97.5 F (36.4 C)  Resp: 16    Estimated body mass index is 30.13 kg/(m^2) as calculated from the following:   Height as of this encounter: 5' 3.5" (1.613 m).   Weight as of this encounter: 172 lb 12.8 oz (78.382 kg).  Physical Exam General Appearance: Well nourished, in no apparent distress. Eyes: PERRLA, EOMs, conjunctiva no swelling or erythema, normal fundi and vessels. Sinuses: No frontal/maxillary tenderness ENT/Mouth: EACs patent / TMs  nl. Nares clear without erythema, swelling, mucoid exudates. Oral hygiene is good. No erythema, swelling, or exudate. Tongue normal, non-obstructing. Tonsils not swollen or erythematous. Hearing normal.  Neck: Supple, thyroid normal. No bruits, nodes or JVD. Respiratory: Respiratory effort normal.  BS equal and clear bilateral without rales, rhonci, wheezing or stridor. Cardio: Heart sounds are normal with regular rate and rhythm and no murmurs, rubs or gallops. Peripheral pulses are normal and equal bilaterally with 1+ ankle edema. No aortic or femoral bruits. Chest: Median sternotomy scar. symmetric with normal excursions and percussion. Breasts: Symmetric, without lumps, nipple discharge, retractions, or fibrocystic changes.  Abdomen: Flat, soft, with bowl sounds. Nontender, no guarding,  rebound, hernias, masses, or organomegaly.  Lymphatics: Non tender without lymphadenopathy.  Musculoskeletal: Full ROM all peripheral extremities, joint stability, 5/5 strength, and normal gait. Skin: Warm and dry without rashes, lesions, cyanosis, clubbing or  ecchymosis.  Neuro: Cranial nerves intact, reflexes equal bilaterally. Normal muscle tone, no cerebellar symptoms. Sensation intact.  Pysch: Awake and oriented X 3, normal affect, Insight and Judgment appropriate.   Assessment and Plan  1. Annual Screening Examination 2. Hypertension  3. Hyperlipidemia 4. T2 DM 5. Vitamin D Deficiency 6. ASHD s/p CABG/MiVR w/BiV pacer   Continue prudent diet as discussed, weight control, BP monitoring, regular exercise, and medications. Discussed med's effects and SE's. Screening labs and tests as requested with regular follow-up as recommended.

## 2013-08-22 NOTE — Patient Instructions (Signed)

## 2013-08-23 LAB — BASIC METABOLIC PANEL WITH GFR
BUN: 13 mg/dL (ref 6–23)
CO2: 28 mEq/L (ref 19–32)
Calcium: 8.9 mg/dL (ref 8.4–10.5)
Creat: 0.58 mg/dL (ref 0.50–1.10)
GFR, Est African American: >89 mL/min
Glucose, Bld: 93 mg/dL (ref 70–99)
Sodium: 141 mEq/L (ref 135–145)

## 2013-08-23 LAB — HEPATIC FUNCTION PANEL
ALT: 17 U/L (ref 0–35)
AST: 19 U/L (ref 0–37)
Alkaline Phosphatase: 70 U/L (ref 39–117)
Bilirubin, Direct: 0.2 mg/dL (ref 0.0–0.3)

## 2013-08-23 LAB — LIPID PANEL
Cholesterol: 126 mg/dL (ref 0–200)
Triglycerides: 113 mg/dL (ref <150)
VLDL: 23 mg/dL (ref 0–40)

## 2013-08-23 LAB — URINALYSIS, MICROSCOPIC ONLY
Bacteria, UA: NONE SEEN
Casts: NONE SEEN

## 2013-08-23 LAB — MICROALBUMIN / CREATININE URINE RATIO: Microalb, Ur: 1.1 mg/dL (ref 0.00–1.89)

## 2013-08-23 LAB — INSULIN, FASTING: Insulin fasting, serum: 7 u[IU]/mL (ref 3–28)

## 2013-08-25 ENCOUNTER — Other Ambulatory Visit: Payer: Self-pay | Admitting: Physician Assistant

## 2013-08-25 ENCOUNTER — Other Ambulatory Visit: Payer: Self-pay

## 2013-08-25 MED ORDER — METFORMIN HCL 500 MG PO TABS: 1000.0000 mg | ORAL_TABLET | Freq: Two times a day (BID) | ORAL | Status: DC

## 2013-08-25 MED ORDER — ATORVASTATIN CALCIUM 40 MG PO TABS: 40.0000 mg | ORAL_TABLET | Freq: Every day | ORAL | Status: DC

## 2013-09-11 ENCOUNTER — Ambulatory Visit (INDEPENDENT_AMBULATORY_CARE_PROVIDER_SITE_OTHER): Payer: Medicare Other | Admitting: General Surgery

## 2013-09-11 VITALS — BP 131/78 | HR 68 | Temp 98.5°F | Resp 16 | Ht 63.0 in | Wt 175.0 lb

## 2013-09-11 DIAGNOSIS — C180 Malignant neoplasm of cecum: Secondary | ICD-10-CM

## 2013-09-11 NOTE — Patient Instructions (Signed)
Will need CEA (tumor marker), CMET, CBC in April.    Will also need colonoscopy in April.  Follow up with me in 6 months.

## 2013-09-11 NOTE — Assessment & Plan Note (Signed)
Patient has no clinical evidence of disease.   In April, I will get her CEA, CMET, and CBC. She will be due for her one-year followup colonoscopy at that time.  We will contact Dr. Kelby Fam office regarding this.  I advised her to continue the when necessary loperamide for diarrhea.  I will see her back in approximately 6 months.  She has followup at the [place] in February of 2015.

## 2013-09-11 NOTE — Progress Notes (Signed)
HISTORY: Patient is status post laparoscopic right hemicolectomy in April of 2014. She is doing quite well. Her diarrhea has improved since she has started taking occasional loperamide. She states that she certainly does not need to take it every day or even every week, but only takes it occasionally. Her anemia  Has resolved.  She denies any abdominal pain, nausea, or vomiting. She has not had any evidence of blood in her stools.  Her husband has developed head and neck cancer. He has had to go through significant radiation and chemotherapy since July last year.   PERTINENT REVIEW OF SYSTEMS: Otherwise negative x 11  Filed Vitals:   09/11/13 1528  BP: 131/78  Pulse: 68  Temp: 98.5 F (36.9 C)  Resp: 16   Filed Weights   09/11/13 1528  Weight: 175 lb (79.379 kg)     EXAM: Head: Normocephalic and atraumatic.  Eyes:  Conjunctivae are normal. Pupils are equal, round, and reactive to light. No scleral icterus.  Neck:  Normal range of motion. Neck supple. No tracheal deviation present. No thyromegaly present.  Resp: No respiratory distress, normal effort. Abd:  Abdomen is soft, non distended and non tender. No masses are palpable.  There is no rebound and no guarding.   No evidence of hernia at incision. Neurological: Alert and oriented to person, place, and time. Coordination normal.  Skin: Skin is warm and dry. No rash noted. No diaphoretic. No erythema. No pallor.  Psychiatric: Normal mood and affect. Normal behavior. Judgment and thought content normal.      ASSESSMENT AND PLAN:   Cecal cancer, s/p lap r colectomy 12/2012 Patient has no clinical evidence of disease.   In April, I will get her CEA, CMET, and CBC. She will be due for her one-year followup colonoscopy at that time.  We will contact Dr. Kelby Fam office regarding this.  I advised her to continue the when necessary loperamide for diarrhea.  I will see her back in approximately 6 months.  She has followup at the  [place] in February of 2015.      Milus Height, MD Surgical Oncology, [facility]  Alesia Richards, MD Unk Pinto, MD

## 2013-09-12 ENCOUNTER — Other Ambulatory Visit: Payer: Self-pay | Admitting: Physician Assistant

## 2013-09-12 DIAGNOSIS — C180 Malignant neoplasm of cecum: Secondary | ICD-10-CM

## 2013-09-15 ENCOUNTER — Telehealth: Payer: Self-pay | Admitting: Oncology

## 2013-09-15 NOTE — Telephone Encounter (Signed)
lmonvm advising the pt of her cancelled feb appts which has been rescheduled to April 2015.lab one week prior to the md visit.

## 2013-09-16 ENCOUNTER — Ambulatory Visit (INDEPENDENT_AMBULATORY_CARE_PROVIDER_SITE_OTHER): Payer: Medicare Other

## 2013-09-16 DIAGNOSIS — Z7901 Long term (current) use of anticoagulants: Secondary | ICD-10-CM

## 2013-09-16 DIAGNOSIS — I4891 Unspecified atrial fibrillation: Secondary | ICD-10-CM

## 2013-09-16 DIAGNOSIS — Z8679 Personal history of other diseases of the circulatory system: Secondary | ICD-10-CM

## 2013-09-16 LAB — POCT INR: INR: 1.4

## 2013-09-17 ENCOUNTER — Telehealth: Payer: Self-pay | Admitting: Pharmacist

## 2013-09-17 NOTE — Telephone Encounter (Signed)
Patient called to inform us she will be out of town for 1 month ([place]) and needs to get an order to get protime while out of town ~ 09/30/13.  Left written order for patient up front.  Will need to call result to [phone] (cell).

## 2013-10-03 ENCOUNTER — Encounter: Payer: Self-pay | Admitting: Gastroenterology

## 2013-10-08 LAB — PROTIME-INR: INR: 2.5 — AB (ref 0.9–1.1)

## 2013-10-09 ENCOUNTER — Ambulatory Visit (INDEPENDENT_AMBULATORY_CARE_PROVIDER_SITE_OTHER): Payer: Medicare Other | Admitting: Cardiology

## 2013-10-10 ENCOUNTER — Telehealth: Payer: Self-pay | Admitting: Internal Medicine

## 2013-10-10 DIAGNOSIS — I495 Sick sinus syndrome: Secondary | ICD-10-CM

## 2013-10-10 DIAGNOSIS — I4891 Unspecified atrial fibrillation: Secondary | ICD-10-CM

## 2013-10-10 NOTE — Telephone Encounter (Signed)
New pROB    Pt requesting to speak to nurse regarding setting up her pace maker replacement. Please call.

## 2013-10-10 NOTE — Telephone Encounter (Signed)
Called wanting to schedule her pacemaker replacement.  Advised that Leonia Reader- Dr. Jackalyn Lombard nurse is not in office today but will forward message to her.  She understands and wants to be called on her cell

## 2013-10-23 ENCOUNTER — Encounter: Payer: Self-pay | Admitting: *Deleted

## 2013-10-23 ENCOUNTER — Ambulatory Visit: Payer: Medicare Other | Admitting: Physician Assistant

## 2013-10-23 ENCOUNTER — Other Ambulatory Visit: Payer: BC Managed Care – PPO

## 2013-10-23 ENCOUNTER — Ambulatory Visit: Payer: BC Managed Care – PPO | Admitting: Oncology

## 2013-10-23 NOTE — Telephone Encounter (Signed)
Patient is rescheduled and aware °

## 2013-10-27 ENCOUNTER — Other Ambulatory Visit: Payer: Self-pay | Admitting: Internal Medicine

## 2013-10-27 ENCOUNTER — Ambulatory Visit (HOSPITAL_COMMUNITY)
Admission: RE | Admit: 2013-10-27 | Discharge: 2013-10-27 | Disposition: A | Discharge status: Home / Self Care | Payer: Medicare Other | Source: Ambulatory Visit | Attending: Internal Medicine | Admitting: Internal Medicine

## 2013-10-27 DIAGNOSIS — I517 Cardiomegaly: Secondary | ICD-10-CM | POA: Insufficient documentation

## 2013-10-27 DIAGNOSIS — I10 Essential (primary) hypertension: Secondary | ICD-10-CM

## 2013-10-28 ENCOUNTER — Encounter (HOSPITAL_COMMUNITY): Payer: Self-pay | Admitting: Pharmacy Technician

## 2013-10-29 ENCOUNTER — Other Ambulatory Visit: Payer: Medicare Other

## 2013-10-29 ENCOUNTER — Other Ambulatory Visit: Payer: Self-pay | Admitting: Cardiology

## 2013-10-31 ENCOUNTER — Other Ambulatory Visit (INDEPENDENT_AMBULATORY_CARE_PROVIDER_SITE_OTHER): Payer: Medicare Other

## 2013-10-31 ENCOUNTER — Ambulatory Visit (INDEPENDENT_AMBULATORY_CARE_PROVIDER_SITE_OTHER): Payer: Medicare Other | Admitting: Internal Medicine

## 2013-10-31 ENCOUNTER — Encounter: Payer: Self-pay | Admitting: Internal Medicine

## 2013-10-31 ENCOUNTER — Encounter (INDEPENDENT_AMBULATORY_CARE_PROVIDER_SITE_OTHER): Payer: Medicare Other

## 2013-10-31 VITALS — BP 152/76 | HR 77 | Ht 63.0 in | Wt 180.0 lb

## 2013-10-31 DIAGNOSIS — I495 Sick sinus syndrome: Secondary | ICD-10-CM

## 2013-10-31 DIAGNOSIS — Z7901 Long term (current) use of anticoagulants: Secondary | ICD-10-CM

## 2013-10-31 DIAGNOSIS — R001 Bradycardia, unspecified: Secondary | ICD-10-CM

## 2013-10-31 DIAGNOSIS — I442 Atrioventricular block, complete: Secondary | ICD-10-CM

## 2013-10-31 DIAGNOSIS — I498 Other specified cardiac arrhythmias: Secondary | ICD-10-CM

## 2013-10-31 DIAGNOSIS — I519 Heart disease, unspecified: Secondary | ICD-10-CM

## 2013-10-31 DIAGNOSIS — Z950 Presence of cardiac pacemaker: Secondary | ICD-10-CM

## 2013-10-31 DIAGNOSIS — I10 Essential (primary) hypertension: Secondary | ICD-10-CM

## 2013-10-31 DIAGNOSIS — I4891 Unspecified atrial fibrillation: Secondary | ICD-10-CM

## 2013-10-31 LAB — MDC_IDC_ENUM_SESS_TYPE_INCLINIC
Brady Statistic RV Percent Paced: 96 %
Lead Channel Impedance Value: 67 Ohm
Lead Channel Impedance Value: 694 Ohm
Lead Channel Pacing Threshold Amplitude: 0.75 V
Lead Channel Setting Pacing Amplitude: 2.5 V
Lead Channel Setting Pacing Pulse Width: 0.4 ms
Lead Channel Setting Sensing Sensitivity: 5.6 mV
MDC IDC MSMT BATTERY IMPEDANCE: 112 Ohm
MDC IDC MSMT BATTERY REMAINING LONGEVITY: 157 mo
MDC IDC MSMT BATTERY VOLTAGE: 2.8 V
MDC IDC MSMT LEADCHNL RV PACING THRESHOLD PULSEWIDTH: 0.4 ms
MDC IDC SESS DTM: 20150227153540

## 2013-10-31 LAB — CBC WITH DIFFERENTIAL/PLATELET
BASOS ABS: 0.1 10*3/uL (ref 0.0–0.1)
BASOS PCT: 1.2 % (ref 0.0–3.0)
EOS ABS: 0.1 10*3/uL (ref 0.0–0.7)
Eosinophils Relative: 1.4 % (ref 0.0–5.0)
HCT: 42 % (ref 36.0–46.0)
Hemoglobin: 13.8 g/dL (ref 12.0–15.0)
Lymphocytes Relative: 22.7 % (ref 12.0–46.0)
Lymphs Abs: 1.4 10*3/uL (ref 0.7–4.0)
MCHC: 32.8 g/dL (ref 30.0–36.0)
MCV: 90.4 fl (ref 78.0–100.0)
Monocytes Absolute: 0.6 10*3/uL (ref 0.1–1.0)
Monocytes Relative: 9.7 % (ref 3.0–12.0)
Neutro Abs: 4.1 10*3/uL (ref 1.4–7.7)
Neutrophils Relative %: 65 % (ref 43.0–77.0)
Platelets: 206 10*3/uL (ref 150.0–400.0)
RBC: 4.65 Mil/uL (ref 3.87–5.11)
RDW: 14.1 % (ref 11.5–14.6)
WBC: 6.3 10*3/uL (ref 4.5–10.5)

## 2013-10-31 LAB — BASIC METABOLIC PANEL
BUN: 11 mg/dL (ref 6–23)
CO2: 31 mEq/L (ref 19–32)
Calcium: 9.4 mg/dL (ref 8.4–10.5)
Chloride: 105 mEq/L (ref 96–112)
Creatinine, Ser: 0.6 mg/dL (ref 0.4–1.2)
GFR: 100.06 mL/min (ref >60.00)
Glucose, Bld: 149 mg/dL — ABNORMAL HIGH (ref 70–99)
POTASSIUM: 3.9 meq/L (ref 3.5–5.1)
Sodium: 141 mEq/L (ref 135–145)

## 2013-10-31 LAB — PROTIME-INR
INR: 2.3 ratio — ABNORMAL HIGH (ref 0.8–1.0)
PROTHROMBIN TIME: 24.3 s — AB (ref 10.2–12.4)

## 2013-10-31 NOTE — Patient Instructions (Addendum)
See instruction sheet for Bi-V ICD upgrade  Your physician has recommended you make the following change in your medication:  1) Stop Aspirin   HOLD Coumadin Sun and Mon prior to procedure

## 2013-11-03 DIAGNOSIS — I428 Other cardiomyopathies: Secondary | ICD-10-CM | POA: Diagnosis not present

## 2013-11-03 DIAGNOSIS — I509 Heart failure, unspecified: Secondary | ICD-10-CM | POA: Diagnosis not present

## 2013-11-03 DIAGNOSIS — I442 Atrioventricular block, complete: Secondary | ICD-10-CM | POA: Diagnosis not present

## 2013-11-03 MED ORDER — CEFAZOLIN SODIUM-DEXTROSE 2-3 GM-% IV SOLR
2.0000 g | INTRAVENOUS | Status: DC
  Filled 2013-11-03: qty 50

## 2013-11-03 MED ORDER — GENTAMICIN SULFATE 40 MG/ML IJ SOLN
80.0000 mg | INTRAMUSCULAR | Status: DC
  Filled 2013-11-03: qty 2

## 2013-11-03 NOTE — Progress Notes (Signed)
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   She was seen by me in September and scheduled for upgrade to CRT-P.  Her husband became ill and she deferred the procedure.  She has now decided that she would like to proceed.  She continues to struggle with CHF.  She reports ongoing SOB and fatigue.  Today, she denies symptoms of palpitations, chest pain, lower extremity edema, dizziness, presyncope, or syncope.  The patient is otherwise without complaint today.   Past Medical History  Diagnosis Date  . Bradycardia     Post cardioversion February, 2011,/digoxin/Cardizem metoprolol stopped, may need pacemaker  . Mitral regurgitation     Treated with repair, atrial appendage was removed or tied off at surgery.. this was proven by TEE February, 2011  . Pulmonary HTN     55 mmHg, echo, February, 2011 / no mention of pulmonary hypertension echo, June, 2011  . Right ventricular dysfunction     Mild to moderate, echo, February, 2011 / normalized echo, June, 2011  . TR (tricuspid regurgitation)     Moderate, echo, February, 2011 / trivial, echo, June, 2011  . Mitral stenosis     Mild, February, 2011, post mitral valve repair / mild, echo, June, 2011  . CAD (coronary artery disease)     LIMA to the LAD at time of mitral valve repair / LIMA atretic,, February, 2011  . Ejection fraction < 50%     EF 30% echo and TEE diagnosis February, 2011, possibly rate related / no contraction by catheter, bradycardia so carvedilol cannot be used / EF 55-60% echo, June, 2011 / EF 50%, echo, February, 2012  . Hemorrhoids, internal   . Diverticulosis of colon   . Colon polyp, hyperplastic   . Environmental allergies   . atrial appendage     Removed were tied off at surgery, proven by TEE February, 2011  . Rash     ?Vicodin, Lotensin, Toprol  . Drug therapy     Tikosyn,  stopped 02/10/2012  . H/O  amiodarone therapy     Satrted 02/12/2012  . PONV (postoperative nausea and vomiting)   . Anginal pain   . High cholesterol     takes Lipitor daily  . Asthma   . COPD (chronic obstructive pulmonary disease)   . Shortness of breath     Evaluated by Dr. Halford Chessman. Mat [id number].... bronchodilator response,,  medication started  /  patient much improved June 14, 201 2  . HTN (hypertension)     takes Mavik daily  . Myocardial infarction 04/2012  ?  Marland Kitchen Pacemaker     May 17, 2012 for  bradycardia and sick sinus syndrome  . CHF (congestive heart failure) 2011    Mild in hospital February, 2011;takes Lasix daily  . Cough     thinks may be related to [facility]   . History of bronchitis     many yrs ago  . Seasonal allergies     takes Claritin daily  . Bruises easily     takes Warfarin daily  . Hemorrhoids   . History of colon polyps   . Hypokalemia     takes KDur daily  . Type II diabetes mellitus     takes Metformin daily  . Cataracts, bilateral     immature  . Depression     takes Effexor daily  . Atrial fibrillation  Treated with maze procedure / recurrent atrial fibrillation February, 2011... TEE cardioversion October 19, 2009  . Atrial tachycardia     February, 2011... Tikosyn started in hospital  . QT prolongation     Tikosyn and Effexor. QT prolonged October 13, 2011, peak is in dose reduced from 500  to -250 twice a day  . Sick sinus syndrome     s/p Medtronic dual chamber PPM 05/17/12  . Acquired complete AV block     AV node ablation May 28, 2012  . 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 Family history of anesthesia complication     sister hard to wake up  . Anemia     Hemoglobin 10.4, December, 2013  . GERD (gastroesophageal reflux disease)   . Arthritis     "not bad; little in my hands; some in my knees" (05/28/2012)  . Cecal cancer    Past Surgical History  Procedure Laterality Date  . Mitral valve repair  2001    "anterior and posterior leaflets"  (12/04/2012)  . Maze  2001    w/ MVR & CABG  . Cardioversion  02/08/2012    Procedure: CARDIOVERSION;  Surgeon: Carlena Bjornstad, MD;  Location: [place];  Service: Cardiovascular;  Laterality: N/A;  . Cardioversion  02/29/2012    Procedure: CARDIOVERSION;  Surgeon: Carlena Bjornstad, MD;  Location: [place];  Service: Cardiovascular;  Laterality: N/A;  . Cardioversion  05/15/2012    Procedure: CARDIOVERSION;  Surgeon: Carlena Bjornstad, MD;  Location: [facility] ENDOSCOPY;  Service: Cardiovascular;  Laterality: N/A;  . Tonsillectomy and adenoidectomy  ~ 1950  . 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 Av nodal ablation  05/28/12  . Cardiac catheterization  05/2012  . Laparoscopic right hemi colectomy  12/03/2012    Procedure: LAPAROSCOPIC RIGHT HEMI COLECTOMY;  Surgeon: Stark Klein, MD;  Location: [place];  Service: General;;  . Umbilical hernia repair N/A 12/03/2012    Procedure: HERNIA REPAIR UMBILICAL ADULT;  Surgeon: Stark Klein, MD;  Location: [place];  Service: General;  Laterality: N/A;  . Diagnostic laparoscopic liver biopsy Left 12/03/2012    Procedure: DIAGNOSTIC LAPAROSCOPIC LIVER BIOPSY;  Surgeon: Stark Klein, MD;  Location: [place];  Service: General;  Laterality: Left;  . Insert / replace / remove pacemaker  05/17/2012    MDT Adapta L implanted by Dr Rayann Heman for tachy/brady syndrome  . [facility] / Maze procedure/ atrial appendage removed were tied off  . Coronary artery bypass graft  2001    CABG X1 "at time of mitral valve repair" (12/04/2012  . Breast lumpectomy Bilateral 1990's  . Cholecystectomy  1995  . Colon surgery    . Abdominal hysterectomy  ?2002    Current Outpatient Prescriptions  Medication Sig Dispense Refill  . acetaminophen (TYLENOL) 500 MG tablet Take 1,000 mg by mouth every 6 (six) hours as needed. For pain      . atorvastatin (LIPITOR) 40 MG tablet Take 1 tablet (40 mg total) by mouth daily.  90 tablet  3  .  budesonide-formoterol (SYMBICORT) 160-4.5 MCG/ACT inhaler Inhale 2 puffs into the lungs as needed.       . calcium carbonate (OS-CAL) 600 MG TABS Take 600 mg by mouth daily.       . carvedilol (COREG) 3.125 MG tablet Take 1 tablet (3.125 mg total) by mouth 2 (two) times daily with a meal.  180  tablet  3  . cholecalciferol (VITAMIN D) 1000 UNITS tablet Take 4,000 Units by mouth daily.      . fish oil-omega-3 fatty acids 1000 MG capsule Take 1 g by mouth 2 (two) times daily.       . furosemide (LASIX) 40 MG tablet Take 0.5 tablets (20 mg total) by mouth daily.  90 tablet  3  . loperamide (IMODIUM) 2 MG capsule Take 2 mg by mouth daily as needed for diarrhea or loose stools. prn      . loratadine (CLARITIN) 10 MG tablet Take 10 mg by mouth daily.      . metFORMIN (GLUCOPHAGE) 500 MG tablet Take 2 tablets (1,000 mg total) by mouth 2 (two) times daily with a meal.  360 tablet  1  . potassium chloride SA (KLOR-CON M20) 20 MEQ tablet Take 3 tablets (60 mEq total) by mouth daily.  90 tablet  3  . trandolapril (MAVIK) 4 MG tablet Take 8 mg by mouth daily.       Marland Kitchen venlafaxine (EFFEXOR) 37.5 MG tablet Take 37.5 mg by mouth daily.       Marland Kitchen warfarin (COUMADIN) 2.5 MG tablet Take as directed by  anticoagulation clinic  100 tablet  1   No current facility-administered medications for this visit.   Facility-Administered Medications Ordered in Other Visits  Medication Dose Route Frequency Provider Last Rate Last Dose  . ceFAZolin (ANCEF) IVPB 2 g/50 mL premix  2 g Intravenous On Call Thompson Grayer, MD      . gentamicin (GARAMYCIN) 80 mg in sodium chloride irrigation 0.9 % 500 mL irrigation  80 mg Irrigation On Call Thompson Grayer, MD        Physical Exam: Filed Vitals:   10/31/13 1258  BP: 152/76  Pulse: 77  Height: 5\' 3"  (1.6 m)  Weight: 180 lb (81.647 kg)    GEN- The patient is alert and oriented x 3 today.  Overall I think she appears less ill than previously Head- normocephalic, atraumatic Eyes-   Sclera clear, conjunctiva pink Ears- hearing intact Oropharynx- clear Lungs- Clear to ausculation bilaterally, normal work of breathing Chest- pacemaker pocket is well healed Heart- Regular rate and rhythm (paced) GI- soft, NT, ND, + BS Extremities- no clubbing, cyanosis, or edema  Pacemaker interrogation- reviewed in detail today,  See PACEART report Echo 04/23/13 is reviewed today, EF 35-40% ekg today reveals afib, V pacing at 75 bpm  Assessment and Plan:  1. Chronic systolic dysfunction The patient has EF 35-40%, NYHA Class III CHF, and is 100% V paced.  I agree with Drs Ron Parker and Bensimhon that V pacing may be contributing to her CHF symptoms.  I agree that upgrade to a CRT-P device is prudent at this time. Risks, benefits, alternatives to pacemaker upgrade to CRT-P were discussed in detail with the patient today. The patient understands that the risks include but are not limited to bleeding, infection, pneumothorax, perforation, tamponade, vascular damage, renal failure, MI, stroke, death,  and lead dislodgement and wishes to proceed. We will therefore schedule the procedure at the next available time.    2. Permanent afib Continue long term anticoagulation Stop ASA  3. Complete heart block Normal pacemaker function See Pace Art report No changes today Plans for upgrade as above  4. CAD Stable No change required today

## 2013-11-04 ENCOUNTER — Encounter (HOSPITAL_COMMUNITY)
Admission: RE | Disposition: A | Discharge status: Home / Self Care | Payer: Self-pay | Source: Ambulatory Visit | Attending: Internal Medicine

## 2013-11-04 ENCOUNTER — Encounter (HOSPITAL_COMMUNITY): Payer: Self-pay | Admitting: General Practice

## 2013-11-04 ENCOUNTER — Ambulatory Visit (HOSPITAL_COMMUNITY)
Admission: RE | Admit: 2013-11-04 | Discharge: 2013-11-05 | Disposition: A | Discharge status: Home / Self Care | Payer: Medicare Other | Source: Ambulatory Visit | Attending: Internal Medicine | Admitting: Internal Medicine

## 2013-11-04 DIAGNOSIS — I519 Heart disease, unspecified: Secondary | ICD-10-CM | POA: Diagnosis present

## 2013-11-04 DIAGNOSIS — I509 Heart failure, unspecified: Secondary | ICD-10-CM | POA: Insufficient documentation

## 2013-11-04 DIAGNOSIS — I5022 Chronic systolic (congestive) heart failure: Secondary | ICD-10-CM

## 2013-11-04 DIAGNOSIS — I442 Atrioventricular block, complete: Secondary | ICD-10-CM | POA: Diagnosis present

## 2013-11-04 DIAGNOSIS — I4891 Unspecified atrial fibrillation: Secondary | ICD-10-CM | POA: Diagnosis present

## 2013-11-04 DIAGNOSIS — I428 Other cardiomyopathies: Secondary | ICD-10-CM | POA: Insufficient documentation

## 2013-11-04 HISTORY — PX: BI-VENTRICULAR PACEMAKER UPGRADE: SHX5464

## 2013-11-04 LAB — SURGICAL PCR SCREEN
MRSA, PCR: NEGATIVE
Staphylococcus aureus: NEGATIVE

## 2013-11-04 LAB — PROTIME-INR
INR: 1.87 — ABNORMAL HIGH (ref 0.00–1.49)
Prothrombin Time: 21 seconds — ABNORMAL HIGH (ref 11.6–15.2)

## 2013-11-04 LAB — GLUCOSE, CAPILLARY
GLUCOSE-CAPILLARY: 128 mg/dL — AB (ref 70–99)
GLUCOSE-CAPILLARY: 127 mg/dL — AB (ref 70–99)
GLUCOSE-CAPILLARY: 126 mg/dL — AB (ref 70–99)
Glucose-Capillary: 183 mg/dL — ABNORMAL HIGH (ref 70–99)

## 2013-11-04 SURGERY — BI-VENTRICULAR PACEMAKER UPGRADE
Anesthesia: LOCAL

## 2013-11-04 MED ORDER — HEPARIN SODIUM (PORCINE) 1000 UNIT/ML IJ SOLN
INTRAMUSCULAR | Status: AC
  Filled 2013-11-04: qty 1

## 2013-11-04 MED ORDER — ONDANSETRON HCL 4 MG/2ML IJ SOLN: 4.0000 mg | Freq: Four times a day (QID) | INTRAMUSCULAR | Status: DC | PRN

## 2013-11-04 MED ORDER — MUPIROCIN 2 % EX OINT
TOPICAL_OINTMENT | Freq: Two times a day (BID) | CUTANEOUS | Status: DC
  Filled 2013-11-04 (×2): qty 22

## 2013-11-04 MED ORDER — VENLAFAXINE HCL ER 37.5 MG PO CP24
37.5000 mg | ORAL_CAPSULE | Freq: Every day | ORAL | Status: DC
  Administered 2013-11-04: 21:00:00 37.5 mg via ORAL
  Filled 2013-11-04 (×2): qty 1

## 2013-11-04 MED ORDER — CEFAZOLIN SODIUM 1-5 GM-% IV SOLN
1.0000 g | Freq: Four times a day (QID) | INTRAVENOUS | Status: AC
  Administered 2013-11-04 – 2013-11-05 (×3): 1 g via INTRAVENOUS
  Filled 2013-11-04 (×3): qty 50

## 2013-11-04 MED ORDER — MIDAZOLAM HCL 5 MG/5ML IJ SOLN
INTRAMUSCULAR | Status: AC
  Filled 2013-11-04: qty 5

## 2013-11-04 MED ORDER — SODIUM CHLORIDE 0.9 % IV SOLN: INTRAVENOUS | Status: DC

## 2013-11-04 MED ORDER — SODIUM CHLORIDE 0.9 % IV SOLN: 250.0000 mL | INTRAVENOUS | Status: DC | PRN

## 2013-11-04 MED ORDER — LIDOCAINE HCL (PF) 1 % IJ SOLN
INTRAMUSCULAR | Status: AC
  Filled 2013-11-04: qty 60

## 2013-11-04 MED ORDER — FENTANYL CITRATE 0.05 MG/ML IJ SOLN
INTRAMUSCULAR | Status: AC
  Filled 2013-11-04: qty 2

## 2013-11-04 MED ORDER — SODIUM CHLORIDE 0.9 % IJ SOLN: 3.0000 mL | INTRAMUSCULAR | Status: DC | PRN

## 2013-11-04 MED ORDER — TRANDOLAPRIL 4 MG PO TABS
8.0000 mg | ORAL_TABLET | Freq: Every day | ORAL | Status: DC
  Filled 2013-11-04: qty 2

## 2013-11-04 MED ORDER — LIVING WELL WITH DIABETES BOOK
Freq: Once | Status: AC
  Administered 2013-11-04: 22:00:00
  Filled 2013-11-04: qty 1

## 2013-11-04 MED ORDER — WARFARIN SODIUM 2.5 MG PO TABS
2.5000 mg | ORAL_TABLET | Freq: Once | ORAL | Status: AC
  Administered 2013-11-04: 16:00:00 2.5 mg via ORAL
  Filled 2013-11-04: qty 1

## 2013-11-04 MED ORDER — ACETAMINOPHEN 325 MG PO TABS
325.0000 mg | ORAL_TABLET | ORAL | Status: DC | PRN
Start: 2013-11-04 — End: 2013-11-05

## 2013-11-04 MED ORDER — WARFARIN - PHYSICIAN DOSING INPATIENT
Freq: Every day | Status: DC
  Administered 2013-11-04: 17:00:00

## 2013-11-04 MED ORDER — CEFAZOLIN SODIUM-DEXTROSE 2-3 GM-% IV SOLR
INTRAVENOUS | Status: AC
  Filled 2013-11-04: qty 50

## 2013-11-04 MED ORDER — POTASSIUM CHLORIDE CRYS ER 20 MEQ PO TBCR
60.0000 meq | EXTENDED_RELEASE_TABLET | Freq: Every day | ORAL | Status: DC
  Administered 2013-11-04: 16:00:00 60 meq via ORAL
  Filled 2013-11-04 (×2): qty 3

## 2013-11-04 MED ORDER — YOU HAVE A PACEMAKER BOOK
Freq: Once | Status: AC
  Administered 2013-11-04: 22:00:00
  Filled 2013-11-04: qty 1

## 2013-11-04 MED ORDER — SODIUM CHLORIDE 0.9 % IJ SOLN: 3.0000 mL | Freq: Two times a day (BID) | INTRAMUSCULAR | Status: DC

## 2013-11-04 MED ORDER — VENLAFAXINE HCL 37.5 MG PO TABS: 37.5000 mg | ORAL_TABLET | Freq: Every day | ORAL | Status: DC

## 2013-11-04 MED ORDER — CARVEDILOL 3.125 MG PO TABS
3.1250 mg | ORAL_TABLET | Freq: Two times a day (BID) | ORAL | Status: DC
  Administered 2013-11-04: 3.125 mg via ORAL
  Filled 2013-11-04 (×3): qty 1

## 2013-11-04 MED ORDER — CHLORHEXIDINE GLUCONATE 4 % EX LIQD
60.0000 mL | Freq: Once | CUTANEOUS | Status: DC
  Filled 2013-11-04: qty 60

## 2013-11-04 NOTE — Interval H&P Note (Signed)
History and Physical Interval Note:  11/04/2013 9:57 AM  Anita Duke  has presented today for surgery, with the diagnosis of Afib  The various methods of treatment have been discussed with the patient and family. After consideration of risks, benefits and other options for treatment, the patient has consented to  Procedure(s): BI-VENTRICULAR PACEMAKER UPGRADE (N/A) as a surgical intervention .  The patient's history has been reviewed, patient examined, no change in status, stable for surgery.  I have reviewed the patient's chart and labs.  Questions were answered to the patient's satisfaction.     Thompson Grayer

## 2013-11-04 NOTE — Op Note (Signed)
SURGEON:  Thompson Grayer, MD      PREPROCEDURE DIAGNOSES:   1. nonischemic cardiomyopathy.   2. New York Heart Association class III, heart failure chronically.   3. Complete heart block   POSTPROCEDURE DIAGNOSES:   1. nonischemic cardiomyopathy.   2. New York Heart Association class III, heart failure chronically.   3. Complete heart block  PROCEDURES:    1.  Pacemaker upgrade to biventricular pacemaker  2.  Left upper extremity venograms     INTRODUCTION:  Anita Duke is a 73 y.o. female a nonischemic CM (EF 35-40, NYHA Class III CHF, complete heart block, and prior pacemaker implantation who presents today for pacemaker upgrade to a biventricular pacemaker due to concerns of worsening CHF with RV pacing.  She does not meet criteria for an ICD at this time.          DESCRIPTION OF PROCEDURE:  Informed written consent was obtained and the patient was brought to the electrophysiology lab in the fasting state.  The patient received IV Versed and Fentanyl as sedation for the procedure today.  The patient's left chest was prepped and draped in the usual sterile fashion by the EP lab staff.  The skin overlying the left deltopectoral region was infiltrated with lidocaine for local analgesia.  A 5-cm incision was made over the existing ICD pocket.  Electrocautery was used to assure hemostasis.  The device was exposed and removed from the pocket.  The device was disconnected from the leads.  The leads were examined thoroughly and their integrity confirmed to be intact.    The right atrial lead was confirmed to be a Medtronic model [id number]  (serial # [id number]) lead implanted 05/17/12.  The right ventricular lead was confirmed to be a Medtronic model [id number] (serial number [id number]) right ventricular defibrillator lead implanted on the same date as the atrial lead.  Left Upper Extremity Venography:  A venogram of the left upper extremity was performed, which revealed a small left axillary vein which  had spasmed but appeared patent, which emptied into a moderate left subclavian vein.   LV Lead Placement:  The left subclavian vein was cannulated.  A Medtronic MB-2 guide was advanced through the left axillary vein into the low lateral right atrium. A Bard curved Damato catheter was introduced through the MB-2 guide and used to cannulate the coronary sinus. Coronary sinus cannulation was confirmed with electrogram recording from the hexapolar catheter. A coronary sinus nonselective venogram was performed by hand injection of nonionic contrast. This demonstrated a large bodied VS with a very small distal lateral branch and a medium sized proximal lateral branch.  A Whisper CSJ wire was introduced through the MB2 Guide and advanced into the distal lateral branch. Hopkins [phone] [id number] (serial number [id number]) lead was advanced through the MB-2 into the distal lateral branch.  The lead could not be advanced into this small branch.  I therefore introduced the Whisper CSJ wire into the proximal lateral branch.  The LV lead was easily advanced into the branch approximately one-thirds from the base to the apex in a very lateral position. In this location with 3-4 bipolar configuration, the left ventricular lead impedance was 730 ohms with a threshold of 1.25 volt at 0.4 Milliseconds with no diaphragmatic stimulation observed when pacing at 10 volts output. The MB-2 guide was therefore removed.  This lead was then secured to the pectoralis fascia using #2 silk suture over the suture sleeve. The pocket  then irrigated with copious gentamicin solution.   Atrial lead afib waves measured 0.8 mV with an impedance of 460 ohms.  The right ventricular lead impedance was 530  ohms and a threshold of 1.0 volts at 0.4 milliseconds.   Device Placement:  The leads were then connected to a [facility] RF model [id number] (serial number [id number]) pacemaker. The pocket was irrigated with  copious gentamicin solution. The pacemaker was then placed into the pocket. The pocket was then closed in 2 layers with 2.0 Vicryl suture for the subcutaneous and subcuticular layers. Steri- strips and a sterile dressing were then applied.  There were no early apparent complications.   CONCLUSIONS:  1. Successful CRT-P upgrade to a [employer] Quadra RF BiV pacemaker for symptomatic chronic systolic dysfunction and complete heart block. 2. No early apparent complications.   Jeneen Rinks Yasmine Kilbourne,MD 11/04/2013 12:09 PM

## 2013-11-04 NOTE — H&P (View-Only) (Signed)
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 female who presents today for routine electrophysiology followup.   She was seen by me in September and scheduled for upgrade to CRT-P.  Her husband became ill and she deferred the procedure.  She has now decided that she would like to proceed.  She continues to struggle with CHF.  She reports ongoing SOB and fatigue.  Today, she denies symptoms of palpitations, chest pain, lower extremity edema, dizziness, presyncope, or syncope.  The patient is otherwise without complaint today.   Past Medical History  Diagnosis Date  . Bradycardia     Post cardioversion February, 2011,/digoxin/Cardizem metoprolol stopped, may need pacemaker  . Mitral regurgitation     Treated with repair, atrial appendage was removed or tied off at surgery.. this was proven by TEE February, 2011  . Pulmonary HTN     55 mmHg, echo, February, 2011 / no mention of pulmonary hypertension echo, June, 2011  . Right ventricular dysfunction     Mild to moderate, echo, February, 2011 / normalized echo, June, 2011  . TR (tricuspid regurgitation)     Moderate, echo, February, 2011 / trivial, echo, June, 2011  . Mitral stenosis     Mild, February, 2011, post mitral valve repair / mild, echo, June, 2011  . CAD (coronary artery disease)     LIMA to the LAD at time of mitral valve repair / LIMA atretic,, February, 2011  . Ejection fraction < 50%     EF 30% echo and TEE diagnosis February, 2011, possibly rate related / no contraction by catheter, bradycardia so carvedilol cannot be used / EF 55-60% echo, June, 2011 / EF 50%, echo, February, 2012  . Hemorrhoids, internal   . Diverticulosis of colon   . Colon polyp, hyperplastic   . Environmental allergies   . atrial appendage     Removed were tied off at surgery, proven by TEE February, 2011  . Rash     ?Vicodin, Lotensin, Toprol  . Drug therapy     Tikosyn,  stopped 02/10/2012  . H/O  amiodarone therapy     Satrted 02/12/2012  . PONV (postoperative nausea and vomiting)   . Anginal pain   . High cholesterol     takes Lipitor daily  . Asthma   . COPD (chronic obstructive pulmonary disease)   . Shortness of breath     Evaluated by Dr. Halford Chessman. Mat [id number].... bronchodilator response,,  medication started  /  patient much improved June 14, 201 2  . HTN (hypertension)     takes Mavik daily  . Myocardial infarction 04/2012  ?  Marland Kitchen Pacemaker     May 17, 2012 for  bradycardia and sick sinus syndrome  . CHF (congestive heart failure) 2011    Mild in hospital February, 2011;takes Lasix daily  . Cough     thinks may be related to [facility]   . History of bronchitis     many yrs ago  . Seasonal allergies     takes Claritin daily  . Bruises easily     takes Warfarin daily  . Hemorrhoids   . History of colon polyps   . Hypokalemia     takes KDur daily  . Type II diabetes mellitus     takes Metformin daily  . Cataracts, bilateral     immature  . Depression     takes Effexor daily  . Atrial fibrillation  Treated with maze procedure / recurrent atrial fibrillation February, 2011... TEE cardioversion October 19, 2009  . Atrial tachycardia     February, 2011... Tikosyn started in hospital  . QT prolongation     Tikosyn and Effexor. QT prolonged October 13, 2011, peak is in dose reduced from 500  to -250 twice a day  . Sick sinus syndrome     s/p Medtronic dual chamber PPM 05/17/12  . Acquired complete AV block     AV node ablation May 28, 2012  . 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 Family history of anesthesia complication     sister hard to wake up  . Anemia     Hemoglobin 10.4, December, 2013  . GERD (gastroesophageal reflux disease)   . Arthritis     "not bad; little in my hands; some in my knees" (05/28/2012)  . Cecal cancer    Past Surgical History  Procedure Laterality Date  . Mitral valve repair  2001    "anterior and posterior leaflets"  (12/04/2012)  . Maze  2001    w/ MVR & CABG  . Cardioversion  02/08/2012    Procedure: CARDIOVERSION;  Surgeon: Carlena Bjornstad, MD;  Location: [place];  Service: Cardiovascular;  Laterality: N/A;  . Cardioversion  02/29/2012    Procedure: CARDIOVERSION;  Surgeon: Carlena Bjornstad, MD;  Location: [place];  Service: Cardiovascular;  Laterality: N/A;  . Cardioversion  05/15/2012    Procedure: CARDIOVERSION;  Surgeon: Carlena Bjornstad, MD;  Location: [facility] ENDOSCOPY;  Service: Cardiovascular;  Laterality: N/A;  . Tonsillectomy and adenoidectomy  ~ 1950  . 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 Av nodal ablation  05/28/12  . Cardiac catheterization  05/2012  . Laparoscopic right hemi colectomy  12/03/2012    Procedure: LAPAROSCOPIC RIGHT HEMI COLECTOMY;  Surgeon: Stark Klein, MD;  Location: [place];  Service: General;;  . Umbilical hernia repair N/A 12/03/2012    Procedure: HERNIA REPAIR UMBILICAL ADULT;  Surgeon: Stark Klein, MD;  Location: [place];  Service: General;  Laterality: N/A;  . Diagnostic laparoscopic liver biopsy Left 12/03/2012    Procedure: DIAGNOSTIC LAPAROSCOPIC LIVER BIOPSY;  Surgeon: Stark Klein, MD;  Location: [place];  Service: General;  Laterality: Left;  . Insert / replace / remove pacemaker  05/17/2012    MDT Adapta L implanted by Dr Rayann Heman for tachy/brady syndrome  . [facility] / Maze procedure/ atrial appendage removed were tied off  . Coronary artery bypass graft  2001    CABG X1 "at time of mitral valve repair" (12/04/2012  . Breast lumpectomy Bilateral 1990's  . Cholecystectomy  1995  . Colon surgery    . Abdominal hysterectomy  ?2002    Current Outpatient Prescriptions  Medication Sig Dispense Refill  . acetaminophen (TYLENOL) 500 MG tablet Take 1,000 mg by mouth every 6 (six) hours as needed. For pain      . atorvastatin (LIPITOR) 40 MG tablet Take 1 tablet (40 mg total) by mouth daily.  90 tablet  3  .  budesonide-formoterol (SYMBICORT) 160-4.5 MCG/ACT inhaler Inhale 2 puffs into the lungs as needed.       . calcium carbonate (OS-CAL) 600 MG TABS Take 600 mg by mouth daily.       . carvedilol (COREG) 3.125 MG tablet Take 1 tablet (3.125 mg total) by mouth 2 (two) times daily with a meal.  180  tablet  3  . cholecalciferol (VITAMIN D) 1000 UNITS tablet Take 4,000 Units by mouth daily.      . fish oil-omega-3 fatty acids 1000 MG capsule Take 1 g by mouth 2 (two) times daily.       . furosemide (LASIX) 40 MG tablet Take 0.5 tablets (20 mg total) by mouth daily.  90 tablet  3  . loperamide (IMODIUM) 2 MG capsule Take 2 mg by mouth daily as needed for diarrhea or loose stools. prn      . loratadine (CLARITIN) 10 MG tablet Take 10 mg by mouth daily.      . metFORMIN (GLUCOPHAGE) 500 MG tablet Take 2 tablets (1,000 mg total) by mouth 2 (two) times daily with a meal.  360 tablet  1  . potassium chloride SA (KLOR-CON M20) 20 MEQ tablet Take 3 tablets (60 mEq total) by mouth daily.  90 tablet  3  . trandolapril (MAVIK) 4 MG tablet Take 8 mg by mouth daily.       Marland Kitchen venlafaxine (EFFEXOR) 37.5 MG tablet Take 37.5 mg by mouth daily.       Marland Kitchen warfarin (COUMADIN) 2.5 MG tablet Take as directed by  anticoagulation clinic  100 tablet  1   No current facility-administered medications for this visit.   Facility-Administered Medications Ordered in Other Visits  Medication Dose Route Frequency Provider Last Rate Last Dose  . ceFAZolin (ANCEF) IVPB 2 g/50 mL premix  2 g Intravenous On Call Thompson Grayer, MD      . gentamicin (GARAMYCIN) 80 mg in sodium chloride irrigation 0.9 % 500 mL irrigation  80 mg Irrigation On Call Thompson Grayer, MD        Physical Exam: Filed Vitals:   10/31/13 1258  BP: 152/76  Pulse: 77  Height: 5\' 3"  (1.6 m)  Weight: 180 lb (81.647 kg)    GEN- The patient is alert and oriented x 3 today.  Overall I think she appears less ill than previously Head- normocephalic, atraumatic Eyes-   Sclera clear, conjunctiva pink Ears- hearing intact Oropharynx- clear Lungs- Clear to ausculation bilaterally, normal work of breathing Chest- pacemaker pocket is well healed Heart- Regular rate and rhythm (paced) GI- soft, NT, ND, + BS Extremities- no clubbing, cyanosis, or edema  Pacemaker interrogation- reviewed in detail today,  See PACEART report Echo 04/23/13 is reviewed today, EF 35-40% ekg today reveals afib, V pacing at 75 bpm  Assessment and Plan:  1. Chronic systolic dysfunction The patient has EF 35-40%, NYHA Class III CHF, and is 100% V paced.  I agree with Drs Ron Parker and Bensimhon that V pacing may be contributing to her CHF symptoms.  I agree that upgrade to a CRT-P device is prudent at this time. Risks, benefits, alternatives to pacemaker upgrade to CRT-P were discussed in detail with the patient today. The patient understands that the risks include but are not limited to bleeding, infection, pneumothorax, perforation, tamponade, vascular damage, renal failure, MI, stroke, death,  and lead dislodgement and wishes to proceed. We will therefore schedule the procedure at the next available time.    2. Permanent afib Continue long term anticoagulation Stop ASA  3. Complete heart block Normal pacemaker function See Pace Art report No changes today Plans for upgrade as above  4. CAD Stable No change required today

## 2013-11-04 NOTE — Discharge Summary (Signed)
ELECTROPHYSIOLOGY PROCEDURE DISCHARGE SUMMARY    Patient ID: Anita Duke,  MRN: [id number], DOB/AGE: [birth year] 73 y.o.  Admit date: 11/04/2013 Discharge date: 11/04/2013  Primary Care Physician: Alesia Richards, MD Primary Cardiologist: Ron Parker Electrophysiologist: Sakiyah Shur  Primary Discharge Diagnosis:  Cardiomyopathy and complete heart block status post upgrade to CRTP this admission  Secondary Discharge Diagnosis:  1.  Congestive heart failure - class III 2.  Permanent atrial fibrillation 3.  Hypertension 4.  Mitral valve disease s/p MVR with LAA removal  5.  Pulmonary hypertension 6.  CAD s/p CABG 7.  COPD  Allergies  Allergen Reactions  . Demerol [Meperidine] Nausea And Vomiting  . Morphine Other (See Comments)    vomiting  . Tetanus Toxoid Rash    "years ago"     Procedures This Admission:  1.  Upgrade of previously implanted dual chamber pacemaker to cardiac resynchronization therapy pacemaker on 11-04-13 by Dr Rayann Heman.  The patient received a STJ Quadra Allure RF pacemaker with model number [id number] left ventricular lead. The previously implanted Medtronic model number 5076 right atrial lead and model number 1245 right ventricular lead were used.  There were no early apparent complications. 2.  CXR on 11-05-2013 demonstrated stable leads, no obvious ptx  Brief HPI: Anita Duke is a 73 y.o. female with a past medical history as outlined above.  She has had progressive problems with CHF and her EF is now 35-40%.  She has permanent afib and is s/p AVN ablation resulting in 100% RV pacing.  CRTP upgrade was recommended.  Risks, benefits and alternatives were reviewed with the patient who wished to proceed.   Hospital Course:  The patient was admitted and underwent upgrade of her previously implanted pacemaker to a cardiac resynchronization therapy pacemaker with details as outlined above.   She was monitored on telemetry overnight which demonstrated V pacing.  Left  chest was without hematoma or ecchymosis.  The device was interrogated and found to be functioning normally.  CXR was obtained and demonstrated no pneumothorax status post device implantation.  Wound care, arm mobility, and restrictions were reviewed with the patient.  Dr Rayann Heman examined the patient and considered them stable for discharge to home.    Discharge Vitals: Blood pressure 134/72, pulse 77, temperature 97.8 F (36.6 C), temperature source Oral, resp. rate 17, height 5\' 3"  (1.6 m), weight 175 lb (79.379 kg), SpO2 94.00%.  Physical Exam: Filed Vitals:   11/04/13 2000 11/04/13 2124 11/04/13 2359 11/05/13 0400  BP: 156/78 164/60 163/64 165/74  Pulse: 78  76 77  Temp: 98.3 F (36.8 C)  98.1 F (36.7 C) 98.2 F (36.8 C)  TempSrc: Oral  Oral Oral  Resp: 17 18 17 18   Height:      Weight:   176 lb 12.9 oz (80.2 kg)   SpO2: 97% 98% 97% 97%    GEN- The patient is well appearing, alert and oriented x 3 today.   Head- normocephalic, atraumatic Eyes-  Sclera clear, conjunctiva pink Ears- hearing intact Oropharynx- clear Neck- supple  Lungs- Clear to ausculation bilaterally, normal work of breathing Heart- Regular rate and rhythm (paced) GI- soft, NT, ND, + BS Extremities- no clubbing, cyanosis, or edema Neuro- strength and sensation are intact   Labs:   Lab Results  Component Value Date   WBC 6.3 10/31/2013   HGB 13.8 10/31/2013   HCT 42.0 10/31/2013   MCV 90.4 10/31/2013   PLT 206.0 10/31/2013    Recent Labs Lab 10/31/13  1202  NA 141  K 3.9  CL 105  CO2 31  BUN 11  CREATININE 0.6  CALCIUM 9.4  GLUCOSE 149*    Discharge Medications:    Medication List    ASK your doctor about these medications       acetaminophen 500 MG tablet  Commonly known as:  TYLENOL  Take 1,000 mg by mouth every 6 (six) hours as needed. For pain     atorvastatin 40 MG tablet  Commonly known as:  LIPITOR  Take 1 tablet (40 mg total) by mouth daily.     budesonide-formoterol 160-4.5  MCG/ACT inhaler  Commonly known as:  SYMBICORT  Inhale 2 puffs into the lungs as needed.     calcium carbonate 600 MG Tabs tablet  Commonly known as:  OS-CAL  Take 600 mg by mouth daily.     carvedilol 3.125 MG tablet  Commonly known as:  COREG  Take 1 tablet (3.125 mg total) by mouth 2 (two) times daily with a meal.     cholecalciferol 1000 UNITS tablet  Commonly known as:  VITAMIN D  Take 4,000 Units by mouth daily.     fish oil-omega-3 fatty acids 1000 MG capsule  Take 1 g by mouth 2 (two) times daily.     furosemide 40 MG tablet  Commonly known as:  LASIX  Take 0.5 tablets (20 mg total) by mouth daily.     loperamide 2 MG capsule  Commonly known as:  IMODIUM  Take 2 mg by mouth daily as needed for diarrhea or loose stools. prn     loratadine 10 MG tablet  Commonly known as:  CLARITIN  Take 10 mg by mouth daily.     metFORMIN 500 MG tablet  Commonly known as:  GLUCOPHAGE  Take 2 tablets (1,000 mg total) by mouth 2 (two) times daily with a meal.     potassium chloride SA 20 MEQ tablet  Commonly known as:  KLOR-CON M20  Take 3 tablets (60 mEq total) by mouth daily.     trandolapril 4 MG tablet  Commonly known as:  MAVIK  Take 8 mg by mouth daily.     venlafaxine XR 37.5 MG 24 hr capsule  Commonly known as:  EFFEXOR-XR  Take 37.5 mg by mouth every evening.     warfarin 2.5 MG tablet  Commonly known as:  COUMADIN  Take as directed by  anticoagulation clinic        Disposition:       Future Appointments Provider Department Dept Phone   11/13/2013 4:00 PM [facility] [place] [phone]   11/20/2013 1:30 PM Vicie Mutters, PA-C [place] [facility] [phone]   12/11/2013 9:30 AM [facility] [place] [facility] [phone]   12/18/2013 10:45 AM Amy Fredderick [facility] [phone]   02/04/2014 12:00 PM Thompson Grayer, MD [place] [facility] [phone]   02/20/2014 11:45 AM Unk Pinto, MD [place] [facility] [phone]   05/26/2014 1:30 PM Ardis Hughs, PA-C [place] [facility] [phone]   08/25/2014 11:00 AM Unk Pinto, MD [place] [facility] [phone]       Duration of Discharge Encounter: Greater than 30 minutes including physician time.  Signed, Thompson Grayer MD

## 2013-11-05 ENCOUNTER — Encounter (HOSPITAL_COMMUNITY): Payer: Self-pay | Admitting: *Deleted

## 2013-11-05 ENCOUNTER — Ambulatory Visit (HOSPITAL_COMMUNITY): Payer: Medicare Other

## 2013-11-05 DIAGNOSIS — I442 Atrioventricular block, complete: Secondary | ICD-10-CM

## 2013-11-05 DIAGNOSIS — I509 Heart failure, unspecified: Secondary | ICD-10-CM | POA: Diagnosis not present

## 2013-11-05 LAB — PROTIME-INR
INR: 1.66 — AB (ref 0.00–1.49)
Prothrombin Time: 19.1 seconds — ABNORMAL HIGH (ref 11.6–15.2)

## 2013-11-05 LAB — GLUCOSE, CAPILLARY: Glucose-Capillary: 113 mg/dL — ABNORMAL HIGH (ref 70–99)

## 2013-11-05 NOTE — Discharge Instructions (Signed)
° °  Supplemental Discharge Instructions for  Pacemaker/Defibrillator Patients  Activity No heavy lifting or vigorous activity with your left/right arm for 6 to 8 weeks.  Do not raise your left/right arm above your head for one week.  Gradually raise your affected arm as drawn below.           03/06                      03/07                       03/08                      03/09       NO DRIVING for 1 week; you may begin driving on 46/27/0350. WOUND CARE   Keep the wound area clean and dry.  Do not get this area wet for one week. No showers for one week; you may shower on 11/13/2013.   The tape/steri-strips on your wound will fall off; do not pull them off.  No bandage is needed on the site.  DO  NOT apply any creams, oils, or ointments to the wound area.   If you notice any drainage or discharge from the wound, any swelling or bruising at the site, or you develop a fever > 101? F after you are discharged home, call the office at once.  Special Instructions   You are still able to use cellular telephones; use the ear opposite the side where you have your pacemaker/defibrillator.  Avoid carrying your cellular phone near your device.   When traveling through airports, show security personnel your identification card to avoid being screened in the metal detectors.  Ask the security personnel to use the hand wand.   Avoid arc welding equipment, MRI testing (magnetic resonance imaging), TENS units (transcutaneous nerve stimulators).  Call the office for questions about other devices.   Avoid electrical appliances that are in poor condition or are not properly grounded.   Microwave ovens are safe to be near or to operate.

## 2013-11-11 ENCOUNTER — Other Ambulatory Visit: Payer: Self-pay

## 2013-11-11 DIAGNOSIS — Z1231 Encounter for screening mammogram for malignant neoplasm of breast: Secondary | ICD-10-CM

## 2013-11-13 ENCOUNTER — Ambulatory Visit (INDEPENDENT_AMBULATORY_CARE_PROVIDER_SITE_OTHER): Payer: Medicare Other | Admitting: *Deleted

## 2013-11-13 DIAGNOSIS — I442 Atrioventricular block, complete: Secondary | ICD-10-CM

## 2013-11-13 LAB — MDC_IDC_ENUM_SESS_TYPE_INCLINIC
Lead Channel Impedance Value: 510 Ohm
Lead Channel Pacing Threshold Amplitude: 0.75 V
Lead Channel Pacing Threshold Amplitude: 1.75 V
Lead Channel Pacing Threshold Pulse Width: 0.6 ms
Lead Channel Sensing Intrinsic Amplitude: 7.6 mV
Lead Channel Setting Pacing Amplitude: 2.5 V
Lead Channel Setting Pacing Pulse Width: 0.4 ms
Lead Channel Setting Sensing Sensitivity: 5.6 mV
MDC IDC MSMT LEADCHNL LV IMPEDANCE VALUE: 450 Ohm
MDC IDC MSMT LEADCHNL RV PACING THRESHOLD PULSEWIDTH: 0.4 ms
MDC IDC STAT BRADY RV PERCENT PACED: 98 %

## 2013-11-13 NOTE — Progress Notes (Signed)
Wound check appointment. Steri-strips removed. Wound without redness or edema. Incision edges approximated, wound well healed. Normal device function. Thresholds, sensing, and impedances consistent with implant measurements. Device programmed at 3.5V/auto capture programmed on for extra safety margin until 3 month visit. Histogram distribution appropriate for patient and level of activity. No high ventricular rates noted. Patient educated about wound care, arm mobility, lifting restrictions. ROV in 3 months with JA.

## 2013-11-14 ENCOUNTER — Telehealth: Payer: Self-pay | Admitting: Oncology

## 2013-11-14 ENCOUNTER — Encounter: Payer: Self-pay | Admitting: Gastroenterology

## 2013-11-14 NOTE — Telephone Encounter (Signed)
Pt called to r/s her April appts till may pt wanted to wait until her colonoscopy appt.

## 2013-11-17 ENCOUNTER — Encounter: Payer: Self-pay | Admitting: Cardiology

## 2013-11-17 ENCOUNTER — Ambulatory Visit (INDEPENDENT_AMBULATORY_CARE_PROVIDER_SITE_OTHER): Payer: Medicare Other | Admitting: Cardiology

## 2013-11-17 VITALS — BP 128/70 | HR 54 | Ht 63.0 in | Wt 179.0 lb

## 2013-11-17 DIAGNOSIS — Z950 Presence of cardiac pacemaker: Secondary | ICD-10-CM

## 2013-11-17 DIAGNOSIS — R0609 Other forms of dyspnea: Secondary | ICD-10-CM

## 2013-11-17 DIAGNOSIS — R0989 Other specified symptoms and signs involving the circulatory and respiratory systems: Secondary | ICD-10-CM

## 2013-11-17 DIAGNOSIS — I050 Rheumatic mitral stenosis: Secondary | ICD-10-CM

## 2013-11-17 NOTE — Patient Instructions (Signed)
**Note De-identified Anita Duke Obfuscation** Your physician recommends that you continue on your current medications as directed. Please refer to the Current Medication list given to you today.  Your physician wants you to follow-up in: 6 months. You will receive a reminder letter in the mail two months in advance. If you don't receive a letter, please call our office to schedule the follow-up appointment.  

## 2013-11-17 NOTE — Progress Notes (Signed)
HPI  Patient is seen today to followup for her overall cardiac status. Recently her pacemaker was upgraded to a biventricular unit. She thinks that she began to feel better very rapidly. She says she is able to carry out more activities. I'm sure that she is also feeling better in general because her husband has just been told that he is cancer free at the moment after very extensive radiation and chemotherapy.  Allergies  Allergen Reactions  . Demerol [Meperidine] Nausea And Vomiting  . Morphine Other (See Comments)    vomiting  . Tetanus Toxoid Rash    "years ago"    Current Outpatient Prescriptions  Medication Sig Dispense Refill  . acetaminophen (TYLENOL) 500 MG tablet Take 1,000 mg by mouth every 6 (six) hours as needed. For pain      . aspirin (ECOTRIN LOW STRENGTH) 81 MG EC tablet Take 81 mg by mouth daily. Swallow whole.      Marland Kitchen atorvastatin (LIPITOR) 40 MG tablet Take 1 tablet (40 mg total) by mouth daily.  90 tablet  3  . budesonide-formoterol (SYMBICORT) 160-4.5 MCG/ACT inhaler Inhale 2 puffs into the lungs as needed.       . calcium carbonate (OS-CAL) 600 MG TABS Take 600 mg by mouth daily.       . carvedilol (COREG) 3.125 MG tablet Take 1 tablet (3.125 mg total) by mouth 2 (two) times daily with a meal.  180 tablet  3  . cholecalciferol (VITAMIN D) 1000 UNITS tablet Take 4,000 Units by mouth daily.      . fish oil-omega-3 fatty acids 1000 MG capsule Take 1 g by mouth 2 (two) times daily.       . furosemide (LASIX) 40 MG tablet Take 0.5 tablets (20 mg total) by mouth daily.  90 tablet  3  . loperamide (IMODIUM) 2 MG capsule Take 2 mg by mouth daily as needed for diarrhea or loose stools. prn      . loratadine (CLARITIN) 10 MG tablet Take 10 mg by mouth daily.      . metFORMIN (GLUCOPHAGE) 500 MG tablet Take 2 tablets (1,000 mg total) by mouth 2 (two) times daily with a meal.  360 tablet  1  . potassium chloride SA (KLOR-CON M20) 20 MEQ tablet Take 3 tablets (60 mEq total)  by mouth daily.  90 tablet  3  . trandolapril (MAVIK) 4 MG tablet Take 8 mg by mouth daily.       Marland Kitchen venlafaxine XR (EFFEXOR-XR) 37.5 MG 24 hr capsule Take 37.5 mg by mouth every evening.      . warfarin (COUMADIN) 2.5 MG tablet Take as directed by  anticoagulation clinic  100 tablet  1   No current facility-administered medications for this visit.    History   Social History  . Marital Status: Married    Spouse Name: N/A    Number of Children: 0  . 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    Marland Kitchen     Social History Main Topics  . Smoking status: Never Smoker   . Smokeless tobacco: Never Used  . Alcohol Use: No  . Drug Use: No  . Sexual Activity: No   Other Topics Concern  . Not on file   Social History Narrative   Does not et regular exercise   Daily caffeine use          Family History  Problem Relation Age of Onset  . Pancreatic cancer Maternal Grandfather   . Heart disease Father   . Diabetes Father   . Heart disease Sister   . Diabetes Mother   . Kidney disease Mother   . Diabetes Sister   . Diabetes Paternal Grandfather   . Diabetes Paternal Grandmother   . Heart disease Paternal Uncle     Past Medical History  Diagnosis Date  . Mitral regurgitation     Treated with repair, atrial appendage was removed or tied off at surgery.. this was proven by TEE February, 2011  . Pulmonary HTN     55 mmHg, echo, February, 2011 / no mention of pulmonary hypertension echo, June, 2011  . Right ventricular dysfunction     Mild to moderate, echo, February, 2011 / normalized echo, June, 2011  . TR (tricuspid regurgitation)     Moderate, echo, February, 2011 / trivial, echo, June, 2011  . Mitral stenosis     Mild, February, 2011, post mitral valve repair / mild, echo, June, 2011  . CAD (coronary artery disease)     LIMA to the LAD at time of mitral valve repair / LIMA atretic,, February, 2011  . Ejection fraction < 50%      EF 30% echo and TEE diagnosis February, 2011, possibly rate related / no contraction by catheter, bradycardia so carvedilol cannot be used / EF 55-60% echo, June, 2011 / EF 50%, echo, February, 2012  . Hemorrhoids, internal   . Diverticulosis of colon   . Colon polyp, hyperplastic   . H/O amiodarone therapy     Satrted 02/12/2012  . High cholesterol   . Asthma   . COPD (chronic obstructive pulmonary disease)   . HTN (hypertension)     takes Mavik daily  . Myocardial infarction 04/2012  ?  . CHF (congestive heart failure) 2011    Mild in hospital February, 2011;takes Lasix daily  . Seasonal allergies     takes Claritin daily  . Type II diabetes mellitus     takes Metformin daily  . Cataracts, bilateral     immature  . Depression     takes Effexor daily  . Atrial fibrillation     Treated with maze procedure / recurrent atrial fibrillation February, 2011... TEE cardioversion October 19, 2009; s/p AVN ablatin  . QT prolongation     Tikosyn and Effexor. QT prolonged October 13, 2011, peak is in dose reduced from 500  to -250 twice a day  . Anemia     Hemoglobin 10.4, December, 2013  . GERD (gastroesophageal reflux disease)   . Arthritis     "not bad; little in my hands; some in my knees" (11/04/2013)  . Cecal cancer 2014    Past Surgical History  Procedure Laterality Date  . Mitral valve repair  2001    "anterior and posterior leaflets" (12/04/2012)  . Maze  2001    w/ MVR & CABG  . Cardioversion  02/08/2012    Procedure: CARDIOVERSION;  Surgeon: Carlena Bjornstad, MD;  Location: [facility];  Service: Cardiovascular;  Laterality: N/A;  . Cardioversion  02/29/2012    Procedure: CARDIOVERSION;  Surgeon: Carlena Bjornstad, MD;  Location: [facility];  Service: Cardiovascular;  Laterality: N/A;  . Cardioversion  05/15/2012    Procedure: CARDIOVERSION;  Surgeon: Carlena Bjornstad, MD;  Location: [facility] ENDOSCOPY;  Service: Cardiovascular;  Laterality: N/A;  . Tonsillectomy and adenoidectomy  ~ 1950  . Pacemaker  insertion  05/17/2012  MDT Adapta L implanted by Dr Rayann Heman for tachy/brady syndrome  . Av node ablation  05/28/12  . Cardiac catheterization  05/2012  . Laparoscopic right hemi colectomy  12/03/2012    Procedure: LAPAROSCOPIC RIGHT HEMI COLECTOMY;  Surgeon: Stark Klein, MD;  Location: [facility];  Service: General;;  . Umbilical hernia repair N/A 12/03/2012    Procedure: HERNIA REPAIR UMBILICAL ADULT;  Surgeon: Stark Klein, MD;  Location: [facility];  Service: General;  Laterality: N/A;  . Diagnostic laparoscopic liver biopsy Left 12/03/2012    Procedure: DIAGNOSTIC LAPAROSCOPIC LIVER BIOPSY;  Surgeon: Stark Klein, MD;  Location: [facility];  Service: General;  Laterality: Left;  . Coronary artery bypass graft  2001    CABG X1 "at time of mitral valve repair" (12/04/2012  . Breast lumpectomy Bilateral 1990's  . Cholecystectomy  1995  . Colon surgery  12/2012  . Abdominal hysterectomy  ?2002  . Bi-ventricular pacemaker upgrade  11/04/2013    upgrade of previously implanted dual chamber pacemaker to STJ Quadra Allure CRTP by Dr Allred    Patient Active Problem List   Diagnosis Date Noted  . Routine general medical examination at a health care facility 08/22/2013  . Chronic systolic dysfunction of left ventricle 05/21/2013  . 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 Special screening for malignant neoplasms, colon 10/31/2012  . Anemia   . Acquired complete AV block   . Sick sinus syndrome   . Pacemaker-Medtronic 05/20/2012  . Allergic rhinitis 04/25/2012  . H/O amiodarone therapy   . QT prolongation   . Mild intermittent asthma 02/16/2011  . Ejection fraction < 50%   . Atrial fibrillation   . Bradycardia   . Mitral regurgitation   . Right ventricular dysfunction   . Mitral stenosis   . Warfarin anticoagulation   . S/P mitral valve repair   . Hx of CABG   . DIABETES MELLITUS 03/02/2009  . HYPERLIPIDEMIA 07/07/2008  . HYPERTENSION 07/07/2008  . DIVERTICULOSIS, COLON 08/04/2002     ROS   Patient denies fever, chills, headache, sweats, rash, change in vision, change in hearing, chest pain, cough, nausea or vomiting, urinary symptoms. All other systems are reviewed and are negative.  PHYSICAL EXAM  Patient is oriented to person time and place. Affect is normal. She is here with her husband. Lungs are clear. Respiratory effort is nonlabored. Cardiac exam reveals S1 and S2. Her pacemaker revision site is nicely healed. Abdomen is soft. There is no peripheral edema. There are no musculoskeletal deformities.  Filed Vitals:   11/17/13 1552  BP: 128/70  Pulse: 54  Height: 5\' 3"  (1.6 m)  Weight: 179 lb (81.194 kg)     ASSESSMENT & PLAN

## 2013-11-17 NOTE — Assessment & Plan Note (Signed)
Patient has now been upgraded to a biventricular unit. She's feeling much better. I'm hoping that this is real. No change in therapy at this time.

## 2013-11-17 NOTE — Assessment & Plan Note (Signed)
The patient does have some mild functional mitral stenosis after her mitral valve repair. Hopefully this has not been playing a role with her shortness of breath. We did not think so based on the preoperative evaluation before her pacemaker upgrade

## 2013-11-17 NOTE — Assessment & Plan Note (Signed)
At this point we think that her dyspnea on exertion is somewhat better since her pacemaker was upgraded to biventricular. I am hopeful that this will continue.

## 2013-11-19 ENCOUNTER — Telehealth (INDEPENDENT_AMBULATORY_CARE_PROVIDER_SITE_OTHER): Payer: Self-pay

## 2013-11-19 ENCOUNTER — Other Ambulatory Visit (INDEPENDENT_AMBULATORY_CARE_PROVIDER_SITE_OTHER): Payer: Self-pay | Admitting: General Surgery

## 2013-11-19 DIAGNOSIS — C180 Malignant neoplasm of cecum: Secondary | ICD-10-CM

## 2013-11-19 NOTE — Telephone Encounter (Signed)
Pt is scheduled for colonoscopy with Dr. Deatra Ina on 01/07/14.  She will have labs drawn about a week prior to the procedure.  CEA, CBC, CMET

## 2013-11-20 ENCOUNTER — Ambulatory Visit (INDEPENDENT_AMBULATORY_CARE_PROVIDER_SITE_OTHER): Payer: Medicare Other | Admitting: Physician Assistant

## 2013-11-20 ENCOUNTER — Encounter: Payer: Self-pay | Admitting: Physician Assistant

## 2013-11-20 VITALS — BP 118/60 | HR 88 | Temp 97.7°F | Resp 16 | Ht 62.0 in | Wt 176.0 lb

## 2013-11-20 DIAGNOSIS — Z1331 Encounter for screening for depression: Secondary | ICD-10-CM

## 2013-11-20 DIAGNOSIS — E785 Hyperlipidemia, unspecified: Secondary | ICD-10-CM

## 2013-11-20 DIAGNOSIS — Z0000 Encounter for general adult medical examination without abnormal findings: Secondary | ICD-10-CM

## 2013-11-20 DIAGNOSIS — I10 Essential (primary) hypertension: Secondary | ICD-10-CM

## 2013-11-20 DIAGNOSIS — E2839 Other primary ovarian failure: Secondary | ICD-10-CM

## 2013-11-20 DIAGNOSIS — I495 Sick sinus syndrome: Secondary | ICD-10-CM

## 2013-11-20 DIAGNOSIS — E559 Vitamin D deficiency, unspecified: Secondary | ICD-10-CM

## 2013-11-20 DIAGNOSIS — Z79899 Other long term (current) drug therapy: Secondary | ICD-10-CM

## 2013-11-20 DIAGNOSIS — E119 Type 2 diabetes mellitus without complications: Secondary | ICD-10-CM

## 2013-11-20 LAB — CBC WITH DIFFERENTIAL/PLATELET
Basophils Absolute: 0.1 K/uL (ref 0.0–0.1)
Basophils Relative: 1 % (ref 0–1)
Eosinophils Absolute: 0.1 K/uL (ref 0.0–0.7)
Eosinophils Relative: 2 % (ref 0–5)
HCT: 41.9 % (ref 36.0–46.0)
Hemoglobin: 14.5 g/dL (ref 12.0–15.0)
Lymphocytes Relative: 27 % (ref 12–46)
Lymphs Abs: 1.8 K/uL (ref 0.7–4.0)
MCH: 29.8 pg (ref 26.0–34.0)
MCHC: 34.6 g/dL (ref 30.0–36.0)
MCV: 86 fL (ref 78.0–100.0)
Monocytes Absolute: 0.8 K/uL (ref 0.1–1.0)
Monocytes Relative: 12 % (ref 3–12)
Neutro Abs: 3.8 K/uL (ref 1.7–7.7)
Neutrophils Relative %: 58 % (ref 43–77)
Platelets: 269 K/uL (ref 150–400)
RBC: 4.87 MIL/uL (ref 3.87–5.11)
RDW: 14.5 % (ref 11.5–15.5)
WBC: 6.5 K/uL (ref 4.0–10.5)

## 2013-11-20 LAB — LIPID PANEL
Cholesterol: 157 mg/dL (ref 0–200)
HDL: 49 mg/dL (ref >39)
LDL Cholesterol: 55 mg/dL (ref 0–99)
Total CHOL/HDL Ratio: 3.2 Ratio
Triglycerides: 263 mg/dL — ABNORMAL HIGH (ref <150)
VLDL: 53 mg/dL — ABNORMAL HIGH (ref 0–40)

## 2013-11-20 LAB — BASIC METABOLIC PANEL WITH GFR
BUN: 13 mg/dL (ref 6–23)
CHLORIDE: 99 meq/L (ref 96–112)
CO2: 31 meq/L (ref 19–32)
CREATININE: 0.61 mg/dL (ref 0.50–1.10)
Calcium: 9.3 mg/dL (ref 8.4–10.5)
GFR, Est African American: >89 mL/min
GFR, Est Non African American: >89 mL/min
GLUCOSE: 113 mg/dL — AB (ref 70–99)
Potassium: 4.3 mEq/L (ref 3.5–5.3)
Sodium: 140 mEq/L (ref 135–145)

## 2013-11-20 LAB — HEPATIC FUNCTION PANEL
ALT: 28 U/L (ref 0–35)
AST: 27 U/L (ref 0–37)
Albumin: 4.3 g/dL (ref 3.5–5.2)
Alkaline Phosphatase: 88 U/L (ref 39–117)
Bilirubin, Direct: 0.2 mg/dL (ref 0.0–0.3)
Indirect Bilirubin: 0.9 mg/dL (ref 0.2–1.2)
Total Bilirubin: 1.1 mg/dL (ref 0.2–1.2)
Total Protein: 6.9 g/dL (ref 6.0–8.3)

## 2013-11-20 LAB — HEMOGLOBIN A1C
HEMOGLOBIN A1C: 6.8 % — AB (ref <5.7)
Mean Plasma Glucose: 148 mg/dL — ABNORMAL HIGH (ref <117)

## 2013-11-20 LAB — TSH: TSH: 0.733 u[IU]/mL (ref 0.350–4.500)

## 2013-11-20 LAB — MAGNESIUM: MAGNESIUM: 1.8 mg/dL (ref 1.5–2.5)

## 2013-11-20 NOTE — Progress Notes (Signed)
Subjective:   Anita Duke is a 73 y.o. female who presents for [facility] to [facility] Annual Wellness Visit on Oct 1st she switched over and 3 month follow up on hypertension, diabetes, hyperlipidemia, vitamin D def.Marland Kitchen   Her blood pressure has been controlled at home, today their BP is BP: 118/60 mmHg She does not workout, currently until cleared by cardio after pacemaker placement and when weather gets better she will start walking. She sees coumadin clinic.  She denies chest pain, shortness of breath, dizziness.  She is on cholesterol medication and denies myalgias. Her cholesterol is at goal. The cholesterol last visit was:   Lab Results  Component Value Date   CHOL 126 08/22/2013   HDL 53 08/22/2013   LDLCALC 50 08/22/2013   TRIG 113 08/22/2013   CHOLHDL 2.4 08/22/2013   She has been working on diet and exercise for diabetes, she has not been checking her blood sugars, due to switch in insurance she needs a new meter that is covered and denies foot ulcerations, nausea, polydipsia, polyuria and visual disturbances. Last A1C in the office was:  Lab Results  Component Value Date   HGBA1C 6.6* 08/22/2013   Had her pacemaker changed on 11/04/13, she is feeling better, she has been back for a check and the wound is healing well, she follows up in 3 months for a follow up.  Patient is on Vitamin D supplement. Increasing stress with husband having diagnosis of cancer, had chemo, had PET scan that was recent and was okay, and has another PET in 6 months.  Names of Other Physician/Practitioners you currently use: 1. [facility]- here for primary care 2. Dr. Celene Squibb, eye doctor, last visit 08/2013 normal- see note 3. , Dr. Lin Landsman dentist, last visit 08/2013 4. Dr. Garwin Brothers Patient Care Team: Unk Pinto, MD as PCP - General (Internal Medicine) Carlena Bjornstad, MD as Consulting Physician (Cardiology) Inda Castle, MD as Consulting Physician  (Gastroenterology)  Medication Review Current Outpatient Prescriptions on File Prior to Visit  Medication Sig Dispense Refill  . acetaminophen (TYLENOL) 500 MG tablet Take 1,000 mg by mouth every 6 (six) hours as needed. For pain      . aspirin (ECOTRIN LOW STRENGTH) 81 MG EC tablet Take 81 mg by mouth daily. Swallow whole.      Marland Kitchen atorvastatin (LIPITOR) 40 MG tablet Take 1 tablet (40 mg total) by mouth daily.  90 tablet  3  . budesonide-formoterol (SYMBICORT) 160-4.5 MCG/ACT inhaler Inhale 2 puffs into the lungs as needed.       . calcium carbonate (OS-CAL) 600 MG TABS Take 600 mg by mouth daily.       . carvedilol (COREG) 3.125 MG tablet Take 1 tablet (3.125 mg total) by mouth 2 (two) times daily with a meal.  180 tablet  3  . cholecalciferol (VITAMIN D) 1000 UNITS tablet Take 4,000 Units by mouth daily.      . fish oil-omega-3 fatty acids 1000 MG capsule Take 1 g by mouth 2 (two) times daily.       . furosemide (LASIX) 40 MG tablet Take 0.5 tablets (20 mg total) by mouth daily.  90 tablet  3  . loperamide (IMODIUM) 2 MG capsule Take 2 mg by mouth daily as needed for diarrhea or loose stools. prn      . loratadine (CLARITIN) 10 MG tablet Take 10 mg by mouth daily.      . metFORMIN (GLUCOPHAGE) 500 MG tablet  Take 2 tablets (1,000 mg total) by mouth 2 (two) times daily with a meal.  360 tablet  1  . potassium chloride SA (KLOR-CON M20) 20 MEQ tablet Take 3 tablets (60 mEq total) by mouth daily.  90 tablet  3  . trandolapril (MAVIK) 4 MG tablet Take 8 mg by mouth daily.       Marland Kitchen venlafaxine XR (EFFEXOR-XR) 37.5 MG 24 hr capsule Take 37.5 mg by mouth every evening.      . warfarin (COUMADIN) 2.5 MG tablet Take as directed by  anticoagulation clinic  100 tablet  1   No current facility-administered medications on file prior to visit.    Current Problems (verified) Patient Active Problem List   Diagnosis Date Noted  . Dyspnea on exertion 11/17/2013  . Routine general medical examination at a  health care facility 08/22/2013  . Chronic systolic dysfunction of left ventricle 05/21/2013  . 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 Special screening for malignant neoplasms, colon 10/31/2012  . Anemia   . Acquired complete AV block   . Sick sinus syndrome   . Pacemaker-Medtronic 05/20/2012  . Allergic rhinitis 04/25/2012  . H/O amiodarone therapy   . QT prolongation   . Mild intermittent asthma 02/16/2011  . Ejection fraction < 50%   . Atrial fibrillation   . Bradycardia   . Mitral regurgitation   . Right ventricular dysfunction   . Mitral stenosis   . Warfarin anticoagulation   . S/P mitral valve repair   . Hx of CABG   . DIABETES MELLITUS 03/02/2009  . HYPERLIPIDEMIA 07/07/2008  . HYPERTENSION 07/07/2008  . DIVERTICULOSIS, COLON 08/04/2002    Screening Tests Health Maintenance  Topic Date Due  . Tetanus/tdap  01/11/1960  . Colonoscopy  11/14/2013  . Influenza Vaccine  04/04/2014  . Mammogram  12/17/2014  . Pneumococcal Polysaccharide Vaccine Age 47 And Over  Completed  . Zostavax  Completed     Immunization History  Administered Date(s) Administered  . Influenza Split 06/24/2011  . Influenza Whole 06/04/2012, 05/16/2013  . Pneumococcal Polysaccharide-23 08/10/2011  . Zoster 05/23/2006    Preventative care: Last colonoscopy: 2014- with poly and subsequent hemicolectomy Last mammogram: 12/2012 Last pap smear/pelvic exam:    DEXA:unknown  Prior vaccinations: TD or Tdap: ALLERGY  Influenza: 2014 Pneumococcal: 2012 Shingles/Zostavax: 2007  History reviewed: allergies, current medications, past family history, past medical history, past social history, past surgical history and problem list  Risk Factors: Osteoporosis: postmenopausal estrogen deficiency, dietary calcium and/or vitamin D deficiency and amenorrhea History of fracture in the past year: no  Tobacco History  Substance Use Topics  . Smoking status: Never Smoker   .  Smokeless tobacco: Never Used  . Alcohol Use: No   She does not smoke.  Patient is not a former smoker. Are there smokers in your home (other than you)?  No  Alcohol Current alcohol use: none  Caffeine Current caffeine use: coffee 1-2 /day  Exercise Exercise limitations: The patient has no exercise limitations. Current exercise: none  Nutrition/Diet Current diet: in general, a "healthy" diet    Cardiac risk factors: advanced age (older than 36 for men, 64 for women), dyslipidemia, hypertension and sedentary lifestyle.  Depression Screen Nurse depression screen reviewed.  (Note: if answer to either of the following is "Yes", a more complete depression screening is indicated)   Q1: Over the past two weeks, have you felt down, depressed or hopeless? No  Q2: Over the past two  weeks, have you felt little interest or pleasure in doing things? No  Have you lost interest or pleasure in daily life? No  Do you often feel hopeless? No  Do you cry easily over simple problems? No  Activities of Daily Living Nurse ADLs screen reviewed.  In your present state of health, do you have any difficulty performing the following activities?:  Driving? No Managing money?  No Feeding yourself? No Getting from bed to chair? No Climbing a flight of stairs? No Preparing food and eating?: No Bathing or showering? No Getting dressed: No Getting to the toilet? No Using the toilet:No Moving around from place to place: No In the past year have you fallen or had a near fall?:Yes   Are you sexually active?  No  Do you have more than one partner?  No  Vision Difficulties: No  Hearing Difficulties: No Do you often ask people to speak up or repeat themselves? No Do you experience ringing or noises in your ears? No Do you have difficulty understanding soft or whispered voices? No  Cognition  Do you feel that you have a problem with memory?No  Do you often misplace items? No  Do you feel safe  at home?  Yes  Advanced directives Does patient have a [place]? Yes Does patient have a Living Will? No- has an appointment to see attorney    Objective:   Vision and hearing screens reviewed.   Blood pressure 118/60, pulse 88, temperature 97.7 F (36.5 C), resp. rate 16, height 5\' 2"  (1.575 m), weight 176 lb (79.833 kg). Body mass index is 32.18 kg/(m^2).  General appearance: alert, no distress, WD/WN,  female Cognitive Testing  Alert? Yes  Normal Appearance?Yes  Oriented to person? Yes  Place? Yes   Time? Yes  Recall of three objects?  No 2/3  Can perform simple calculations? Yes  Displays appropriate judgment?Yes  Can read the correct time from a watch face?Yes  HEENT: normocephalic, sclerae anicteric, TMs pearly, nares patent, no discharge or erythema, pharynx normal Oral cavity: MMM, no lesions Neck: supple, no lymphadenopathy, no thyromegaly, no masses Heart: RRR, normal S1, S2, no murmurs, healing pacemaker site Lungs: CTA bilaterally, no wheezes, rhonchi, or rales Abdomen: +bs, soft, obese, non tender, non distended, no masses, no hepatomegaly, no splenomegaly Musculoskeletal: nontender, no swelling, no obvious deformity Extremities: no edema, no cyanosis, no clubbing, bilateral toes brittle, long, and scaly feet.  Pulses: 2+ symmetric, upper and lower extremities, normal cap refill Neurological: alert, oriented x 3, CN2-12 intact, strength normal upper extremities and lower extremities, sensation normal throughout, DTRs 2+ throughout, no cerebellar signs, gait normal Psychiatric: normal affect, behavior normal, pleasant  Breast:defer Gyn: defer   Rectal: defer   Assessment:   1. HYPERTENSION - CBC with Differential - BASIC METABOLIC PANEL WITH GFR - Hepatic function panel - TSH  2. Sick sinus syndrome Continue follow up with cardio  3. DIABETES MELLITUS - Hemoglobin A1c - Ambulatory referral to Podiatry  4. HYPERLIPIDEMIA - Lipid  panel  5. Obesity Long discussion weight loss, diet.   6. Estrogen deficiency - DG Bone Density; Future  7. Unspecified vitamin D deficiency - Vit D  25 hydroxy (rtn osteoporosis monitoring)  8. Encounter for long-term (current) use of other medications - Magnesium  9. Screening for depression Negative    Plan:   During the course of the visit the patient was educated and counseled about appropriate screening and preventive services including:  Influenza vaccine  Screening electrocardiogram  Screening mammography  Bone densitometry screening  Colorectal cancer screening  Diabetes screening  Glaucoma screening  Nutrition counseling   Advanced directives: given information  Screening recommendations, referrals:  Vaccinations: Tdap vaccine not done  Influenza vaccine yes Pneumococcal vaccine not done Shingles vaccine not done Hep B vaccine not done  Nutrition assessed and recommended  Colonoscopy yes Mammogram yes Pap smear no Pelvic exam no Recommended yearly ophthalmology/optometry visit for glaucoma screening and checkup Recommended yearly dental visit for hygiene and checkup Advanced directives - yes  Conditions/risks identified: BMI: Discussed weight loss, diet, and increase physical activity.  Increase physical activity: AHA recommends 150 minutes of physical activity a week.  Medications reviewed DEXA- ordered Diabetes is not at goal, ACE/ARB therapy: Yes. Will refer to podiatrist Urinary Incontinence is not an issue: discussed non pharmacology and pharmacology options.  Fall risk: moderate- discussed PT, home fall assessment, medications.   Medicare Attestation I have personally reviewed: The patient's medical and social history Their use of alcohol, tobacco or illicit drugs Their current medications and supplements The patient's functional ability including ADLs,fall risks, home safety risks, cognitive, and hearing and visual  impairment Diet and physical activities Evidence for depression or mood disorders  The patient's weight, height, BMI, and visual acuity have been recorded in the chart.  I have made referrals, counseling, and provided education to the patient based on review of the above and I have provided the patient with a written personalized care plan for preventive services.     Vicie Mutters, PA-C   11/20/2013

## 2013-11-20 NOTE — Patient Instructions (Signed)

## 2013-11-21 LAB — VITAMIN D 25 HYDROXY (VIT D DEFICIENCY, FRACTURES): VIT D 25 HYDROXY: 82 ng/mL (ref 30–89)

## 2013-11-24 ENCOUNTER — Ambulatory Visit (INDEPENDENT_AMBULATORY_CARE_PROVIDER_SITE_OTHER): Payer: Medicare Other | Admitting: *Deleted

## 2013-11-24 DIAGNOSIS — Z5181 Encounter for therapeutic drug level monitoring: Secondary | ICD-10-CM

## 2013-11-24 DIAGNOSIS — Z7901 Long term (current) use of anticoagulants: Secondary | ICD-10-CM | POA: Insufficient documentation

## 2013-11-24 DIAGNOSIS — I4891 Unspecified atrial fibrillation: Secondary | ICD-10-CM

## 2013-11-24 LAB — POCT INR: INR: 3.5

## 2013-11-25 ENCOUNTER — Telehealth (INDEPENDENT_AMBULATORY_CARE_PROVIDER_SITE_OTHER): Payer: Self-pay | Admitting: *Deleted

## 2013-11-25 ENCOUNTER — Telehealth: Payer: Self-pay | Admitting: Gastroenterology

## 2013-11-25 ENCOUNTER — Ambulatory Visit: Payer: Self-pay | Admitting: Physician Assistant

## 2013-11-25 NOTE — Telephone Encounter (Signed)
Pt states she thinks she is supposed to have a CT scan after her colon is done. Not sure which doctor wanted her to have this. Dr. Deatra Ina do you want her to have CT done? Please advise.

## 2013-11-25 NOTE — Telephone Encounter (Signed)
Pt called asking about a CT scan that she is due and she stated she has an appt [facility]. I advised pt to call their that they should know exactly what test she is due for and they should get it scheduled.

## 2013-11-27 ENCOUNTER — Telehealth: Payer: Self-pay | Admitting: Oncology

## 2013-11-27 NOTE — Telephone Encounter (Signed)
pt called to r/s 5/13 appt to 5/8 due to going out of town. pt has new d/t.

## 2013-11-30 NOTE — Telephone Encounter (Signed)
She should check with Dr Jana Hakim

## 2013-12-01 NOTE — Telephone Encounter (Signed)
Left message for pt to call back  °

## 2013-12-02 NOTE — Telephone Encounter (Signed)
Spoke with pt and she is aware.

## 2013-12-10 ENCOUNTER — Encounter: Payer: Self-pay | Admitting: Internal Medicine

## 2013-12-11 ENCOUNTER — Other Ambulatory Visit: Payer: Medicare Other

## 2013-12-17 ENCOUNTER — Ambulatory Visit: Payer: Medicare Other

## 2013-12-18 ENCOUNTER — Ambulatory Visit: Payer: Medicare Other | Admitting: Physician Assistant

## 2013-12-22 ENCOUNTER — Ambulatory Visit
Admission: RE | Admit: 2013-12-22 | Discharge: 2013-12-22 | Disposition: A | Discharge status: Home / Self Care | Payer: Self-pay | Source: Ambulatory Visit

## 2013-12-22 ENCOUNTER — Telehealth: Payer: Self-pay | Admitting: *Deleted

## 2013-12-22 DIAGNOSIS — Z1231 Encounter for screening mammogram for malignant neoplasm of breast: Secondary | ICD-10-CM

## 2013-12-22 NOTE — Telephone Encounter (Signed)
Noted. Called patient, no answer, left message for her to call back.

## 2013-12-22 NOTE — Telephone Encounter (Signed)
Patient is for recall colonoscopy on 01/06/14. Patient's last colonoscopy was on 11/14/13, dx with colon cancer. This is the 1 year follow up. Patient has extensive heart history, she is on coumadin. Please review. Is patient okay for direct colonoscopy here at [facility] or hospital procedure or office visit first? Please advise on coumadin also. Thanks,Cailen Mihalik,pv

## 2013-12-22 NOTE — Telephone Encounter (Signed)
Needs OV first to assess medical condition

## 2013-12-23 ENCOUNTER — Ambulatory Visit (INDEPENDENT_AMBULATORY_CARE_PROVIDER_SITE_OTHER): Payer: Medicare Other | Admitting: Pharmacist

## 2013-12-23 DIAGNOSIS — Z5181 Encounter for therapeutic drug level monitoring: Secondary | ICD-10-CM

## 2013-12-23 DIAGNOSIS — I4891 Unspecified atrial fibrillation: Secondary | ICD-10-CM

## 2013-12-23 LAB — POCT INR: INR: 2.4

## 2013-12-23 NOTE — Telephone Encounter (Signed)
Dr. Deatra Ina,  You are 100% full for your office visits for a while.  I left a message for Jackquelyn to call me to see what we could do.  Would this pt be ok seeing one of the extenders or did you want to see her yourself given her cardiac hx?  Cyril Mourning

## 2013-12-23 NOTE — Telephone Encounter (Signed)
Okay for extender to see patient

## 2013-12-23 NOTE — Telephone Encounter (Signed)
No answer; LMOM for pt to call back

## 2013-12-23 NOTE — Telephone Encounter (Signed)
Spoke with pt and appt made with Delma Post 12-25-13 at 8:30 a.m.

## 2013-12-25 ENCOUNTER — Other Ambulatory Visit: Payer: Self-pay | Admitting: Internal Medicine

## 2013-12-25 ENCOUNTER — Ambulatory Visit (INDEPENDENT_AMBULATORY_CARE_PROVIDER_SITE_OTHER): Payer: Medicare Other | Admitting: Nurse Practitioner

## 2013-12-25 ENCOUNTER — Encounter: Payer: Self-pay | Admitting: Nurse Practitioner

## 2013-12-25 ENCOUNTER — Telehealth: Payer: Self-pay | Admitting: *Deleted

## 2013-12-25 VITALS — BP 140/58 | HR 72 | Ht 63.0 in | Wt 180.6 lb

## 2013-12-25 DIAGNOSIS — R928 Other abnormal and inconclusive findings on diagnostic imaging of breast: Secondary | ICD-10-CM

## 2013-12-25 DIAGNOSIS — Z950 Presence of cardiac pacemaker: Secondary | ICD-10-CM

## 2013-12-25 DIAGNOSIS — Z86010 Personal history of colonic polyps: Secondary | ICD-10-CM

## 2013-12-25 DIAGNOSIS — C180 Malignant neoplasm of cecum: Secondary | ICD-10-CM

## 2013-12-25 DIAGNOSIS — Z7901 Long term (current) use of anticoagulants: Secondary | ICD-10-CM

## 2013-12-25 MED ORDER — SOD PICOSULFATE-MAG OX-CIT ACD 10-3.5-12 MG-GM-GM PO PACK: PACK | ORAL | Status: DC

## 2013-12-25 NOTE — Progress Notes (Signed)
     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 Barry Dienes.  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  No evidence for metastatic disease. A liver nodule was biopsied at time of surgery, path was benign. Patient is due for surveillance colonoscopy. She has a pacemaker and is on chronic Coumadin. No gastrointestinal complaints. No chest pain, shortness of breath.  Current Medications, Allergies, Past Medical History, Past Surgical History, Family History and Social History were reviewed in [employer] record.  Physical Exam: General: Pleasant, well developed , white female in no acute distress Head: Normocephalic and atraumatic Eyes:  sclerae anicteric, conjunctiva pink  Ears: Normal auditory acuity Lungs: Clear throughout to auscultation Heart: Regular rate and rhythm Neurological: Alert oriented x 4, grossly nonfocal Psychological:  Alert and cooperative. Normal mood and affect  Assessment and Recommendations:  24. 53. 73 year old female with history of cecal cancer March 2013, s/p right hemicolectomy. Due for surveillance colonoscopy. The risks, benefits, and alternatives to colonoscopy with possible biopsy and possible polypectomy were discussed with the patient and she consents to proceed.   2. Significant cardiac history: MV repair / afib, s/p AV node ablation /  pacemaker placement 2013, upgraded to biventricular unit a few months back / CAD  3. Chronic coumadin. .Will contact cardiology to about holding coumadin for colonoscopy.

## 2013-12-25 NOTE — Telephone Encounter (Signed)
  12/25/2013   RE: CAIDENCE KASEMAN DOB: September 09, 1940 MRN: [id number]   Dear Ron Parker,    We have scheduled the above patient for an endoscopic procedure. Our records show that she is on anticoagulation therapy.   Please advise as to how long the patient may come off her therapy of Coumadin prior to the procedure, which is scheduled for 01-06-2014.  Please fax back/ or route the completed form to [employer], CMA.   Sincerely,    Carlyle Dolly

## 2013-12-25 NOTE — Progress Notes (Signed)
Reviewed and agree with management. Robert D. Kaplan, M.D., FACG  

## 2013-12-25 NOTE — Patient Instructions (Signed)
You have been scheduled for a colonoscopy with propofol with Dr. Deatra Ina. Please follow written instructions given to you at your visit today.  Please pick up your prep kit at the pharmacy within the next 1-3 days. If you use inhalers (even only as needed), please bring them with you on the day of your procedure. Your physician has requested that you go to www.startemmi.com and enter the access code given to you at your visit today. This web site gives a general overview about your procedure. However, you should still follow specific instructions given to you by our office regarding your preparation for the procedure.

## 2013-12-26 NOTE — Telephone Encounter (Signed)
Patient notified that its okay to hold Coumadin seven days before procedure. Patient verbalized understanding.

## 2013-12-26 NOTE — Telephone Encounter (Signed)
The patient can be taken off Coumadin for 7 days before the procedure. Plan to restart Coumadin after the procedure.  Daryel November, MD

## 2013-12-31 ENCOUNTER — Telehealth: Payer: Self-pay | Admitting: Cardiology

## 2013-12-31 NOTE — Telephone Encounter (Signed)
Spoke with pt who is scheduled for a colonoscopy. Dr. Ron Parker has given pt permission to stop Coumadin prior to procedure (see phone note dated 4/23). Pt is asking about taking ASA, metformin and furosemide prior to procedure.  I told pt GI did not request for pt to hold ASA prior to procedure. I asked her to contact GI with questions regarding medications prior to surgery.

## 2013-12-31 NOTE — Telephone Encounter (Signed)
New message    Patient need to stop medication due to upcoming procedure on  5/5.

## 2014-01-01 ENCOUNTER — Ambulatory Visit: Payer: Medicare Other | Admitting: Gastroenterology

## 2014-01-03 LAB — COMPREHENSIVE METABOLIC PANEL
ALK PHOS: 79 U/L (ref 39–117)
ALT: 17 U/L (ref 0–35)
AST: 20 U/L (ref 0–37)
Albumin: 4.2 g/dL (ref 3.5–5.2)
BILIRUBIN TOTAL: 1.6 mg/dL — AB (ref 0.2–1.2)
BUN: 13 mg/dL (ref 6–23)
CO2: 28 mEq/L (ref 19–32)
CREATININE: 0.63 mg/dL (ref 0.50–1.10)
Calcium: 9.4 mg/dL (ref 8.4–10.5)
Chloride: 104 mEq/L (ref 96–112)
GLUCOSE: 108 mg/dL — AB (ref 70–99)
Potassium: 3.9 mEq/L (ref 3.5–5.3)
SODIUM: 142 meq/L (ref 135–145)
TOTAL PROTEIN: 6.6 g/dL (ref 6.0–8.3)

## 2014-01-03 LAB — CBC
HCT: 42 % (ref 36.0–46.0)
Hemoglobin: 14.4 g/dL (ref 12.0–15.0)
MCH: 30.1 pg (ref 26.0–34.0)
MCHC: 34.3 g/dL (ref 30.0–36.0)
MCV: 87.7 fL (ref 78.0–100.0)
Platelets: 227 10*3/uL (ref 150–400)
RBC: 4.79 MIL/uL (ref 3.87–5.11)
RDW: 14.2 % (ref 11.5–15.5)
WBC: 6.1 10*3/uL (ref 4.0–10.5)

## 2014-01-03 LAB — CEA: CEA: 2.4 ng/mL (ref 0.0–5.0)

## 2014-01-06 ENCOUNTER — Encounter: Payer: Medicare Other | Admitting: Gastroenterology

## 2014-01-06 ENCOUNTER — Encounter: Payer: Self-pay | Admitting: Gastroenterology

## 2014-01-06 ENCOUNTER — Ambulatory Visit (AMBULATORY_SURGERY_CENTER): Payer: Medicare Other | Admitting: Gastroenterology

## 2014-01-06 VITALS — BP 137/68 | HR 73 | Temp 96.9°F | Resp 17 | Ht 63.0 in | Wt 180.0 lb

## 2014-01-06 DIAGNOSIS — D126 Benign neoplasm of colon, unspecified: Secondary | ICD-10-CM

## 2014-01-06 DIAGNOSIS — Z86010 Personal history of colonic polyps: Secondary | ICD-10-CM

## 2014-01-06 DIAGNOSIS — Z85038 Personal history of other malignant neoplasm of large intestine: Secondary | ICD-10-CM

## 2014-01-06 LAB — GLUCOSE, CAPILLARY
GLUCOSE-CAPILLARY: 109 mg/dL — AB (ref 70–99)
Glucose-Capillary: 101 mg/dL — ABNORMAL HIGH (ref 70–99)

## 2014-01-06 MED ORDER — SODIUM CHLORIDE 0.9 % IV SOLN: 500.0000 mL | INTRAVENOUS | Status: DC

## 2014-01-06 NOTE — Op Note (Addendum)
[place]  [address]. [place], [address]   COLONOSCOPY PROCEDURE REPORT  PATIENT: Anita, Duke.  MR#: [id number] BIRTHDATE: December 06, 1940 , 72  yrs. old GENDER: Female ENDOSCOPIST: Inda Castle, MD REFERRED SW:NIOEVOJ Melford Aase, M.D. PROCEDURE DATE:  01/06/2014 PROCEDURE:   Colonoscopy with snare polypectomy First Screening Colonoscopy - Avg.  risk and is 50 yrs.  old or older - No.  Prior Negative Screening - Now for repeat screening. N/A  History of Adenoma - Now for follow-up colonoscopy & has been > or = to 3 yrs.  No.  It has been less than 3 yrs since last colonoscopy.  Other: See Comments  Polyps Removed Today? Yes. ASA CLASS:   Class III INDICATIONS:High risk patient with personal history of colon cancer.2014 MEDICATIONS: MAC sedation, administered by CRNA and propofol (Diprivan) 200mg  IV  DESCRIPTION OF PROCEDURE:   After the risks benefits and alternatives of the procedure were thoroughly explained, informed consent was obtained.       The LB [id number]  endoscope was introduced through the anus and advanced to the surgical anastomosis. No adverse events experienced.   The quality of the prep was excellent using Suprep  The instrument was then slowly withdrawn as the colon was fully examined.      COLON FINDINGS: Two sessile polyps measuring 3 mm in size were found in the proximal transverse colon.  A polypectomy was performed with a cold snare.  The resection was complete and the polyp tissue was completely retrieved.   The colon was otherwise normal.  There was no diverticulosis, inflammation, polyps or cancers unless previously stated.  Retroflexed views revealed no abnormalities. The time to anastamosis=3 minutes 27 seconds.  Withdrawal time=7 minutes 41 seconds.  The scope was withdrawn and the procedure completed. COMPLICATIONS: There were no complications.  ENDOSCOPIC IMPRESSION: 1.   Two sessile polyps measuring 3 mm in  size were found in the proximal transverse colon; polypectomy was performed with a cold snare 2.   The colon was otherwise normal  RECOMMENDATIONS: Colonoscopy 3 years Resume coumadin in am   eSigned:  Inda Castle, MD 01/06/2014 9:04 AM Revised: 01/06/2014 9:04 AM  cc:

## 2014-01-06 NOTE — Progress Notes (Signed)
  [facility] Post-op Note  Patient: Anita Duke  Procedure(s) Performed: colonoscopy  Patient Location: [facility] - Recovery Area  Anesthesia Type: Deep Sedation/Propofol  Level of Consciousness: awake, oriented and patient cooperative  Airway and Oxygen Therapy: Patient Spontanous Breathing  Post-op Pain: none  Post-op Assessment:  Post-op Vital signs reviewed, Patient's Cardiovascular Status Stable, Respiratory Function Stable, Patent Airway, No signs of Nausea or vomiting and Pain level controlled  Post-op Vital Signs: Reviewed and stable  Complications: No apparent anesthesia complications  Armany Mano E Juanna Pudlo 8:52 AM

## 2014-01-06 NOTE — Progress Notes (Signed)
Called to room to assist during endoscopic procedure.  Patient ID and intended procedure confirmed with present staff. Received instructions for my participation in the procedure from the performing physician.  

## 2014-01-06 NOTE — Patient Instructions (Signed)
Discharge instructions given with verbal understanding. Handout on polyps. Resume Coumadin tomorrow. Resume all other medications today. YOU HAD AN ENDOSCOPIC PROCEDURE TODAY AT [facility]: Refer to the procedure report that was given to you for any specific questions about what was found during the examination.  If the procedure report does not answer your questions, please call your gastroenterologist to clarify.  If you requested that your care partner not be given the details of your procedure findings, then the procedure report has been included in a sealed envelope for you to review at your convenience later.  YOU SHOULD EXPECT: Some feelings of bloating in the abdomen. Passage of more gas than usual.  Walking can help get rid of the air that was put into your GI tract during the procedure and reduce the bloating. If you had a lower endoscopy (such as a colonoscopy or flexible sigmoidoscopy) you may notice spotting of blood in your stool or on the toilet paper. If you underwent a bowel prep for your procedure, then you may not have a normal bowel movement for a few days.  DIET: Your first meal following the procedure should be a light meal and then it is ok to progress to your normal diet.  A half-sandwich or bowl of soup is an example of a good first meal.  Heavy or fried foods are harder to digest and may make you feel nauseous or bloated.  Likewise meals heavy in dairy and vegetables can cause extra gas to form and this can also increase the bloating.  Drink plenty of fluids but you should avoid alcoholic beverages for 24 hours.  ACTIVITY: Your care partner should take you home directly after the procedure.  You should plan to take it easy, moving slowly for the rest of the day.  You can resume normal activity the day after the procedure however you should NOT DRIVE or use heavy machinery for 24 hours (because of the sedation medicines used during the test).    SYMPTOMS TO  REPORT IMMEDIATELY: A gastroenterologist can be reached at any hour.  During normal business hours, 8:30 AM to 5:00 PM Monday through Friday, call [phone].  After hours and on weekends, please call the GI answering service at [phone] who will take a message and have the physician on call contact you.   Following lower endoscopy (colonoscopy or flexible sigmoidoscopy):  Excessive amounts of blood in the stool  Significant tenderness or worsening of abdominal pains  Swelling of the abdomen that is new, acute  Fever of 100F or higher  FOLLOW UP: If any biopsies were taken you will be contacted by phone or by letter within the next 1-3 weeks.  Call your gastroenterologist if you have not heard about the biopsies in 3 weeks.  Our staff will call the home number listed on your records the next business day following your procedure to check on you and address any questions or concerns that you may have at that time regarding the information given to you following your procedure. This is a courtesy call and so if there is no answer at the home number and we have not heard from you through the emergency physician on call, we will assume that you have returned to your regular daily activities without incident.  SIGNATURES/CONFIDENTIALITY: You and/or your care partner have signed paperwork which will be entered into your electronic medical record.  These signatures attest to the fact that that the information above on your  After Visit Summary has been reviewed and is understood.  Full responsibility of the confidentiality of this discharge information lies with you and/or your care-partner.

## 2014-01-07 ENCOUNTER — Ambulatory Visit
Admission: RE | Admit: 2014-01-07 | Discharge: 2014-01-07 | Disposition: A | Discharge status: Home / Self Care | Payer: Medicare Other | Source: Ambulatory Visit | Attending: Internal Medicine | Admitting: Internal Medicine

## 2014-01-07 ENCOUNTER — Telehealth: Payer: Self-pay | Admitting: *Deleted

## 2014-01-07 DIAGNOSIS — R928 Other abnormal and inconclusive findings on diagnostic imaging of breast: Secondary | ICD-10-CM

## 2014-01-07 NOTE — Telephone Encounter (Signed)
No answer, left message to call if questions or concerns. 

## 2014-01-09 ENCOUNTER — Other Ambulatory Visit (HOSPITAL_BASED_OUTPATIENT_CLINIC_OR_DEPARTMENT_OTHER): Payer: Medicare Other

## 2014-01-09 DIAGNOSIS — C180 Malignant neoplasm of cecum: Secondary | ICD-10-CM

## 2014-01-09 LAB — COMPREHENSIVE METABOLIC PANEL (CC13)
ALBUMIN: 3.8 g/dL (ref 3.5–5.0)
ALK PHOS: 90 U/L (ref 40–150)
ALT: 28 U/L (ref 0–55)
AST: 23 U/L (ref 5–34)
Anion Gap: 8 mEq/L (ref 3–11)
BUN: 11.2 mg/dL (ref 7.0–26.0)
CALCIUM: 9.8 mg/dL (ref 8.4–10.4)
CO2: 29 mEq/L (ref 22–29)
CREATININE: 0.7 mg/dL (ref 0.6–1.1)
Chloride: 106 mEq/L (ref 98–109)
Glucose: 154 mg/dl — ABNORMAL HIGH (ref 70–140)
POTASSIUM: 4.1 meq/L (ref 3.5–5.1)
Sodium: 143 mEq/L (ref 136–145)
Total Bilirubin: 1.41 mg/dL — ABNORMAL HIGH (ref 0.20–1.20)
Total Protein: 7 g/dL (ref 6.4–8.3)

## 2014-01-09 LAB — CBC WITH DIFFERENTIAL/PLATELET
BASO%: 1 % (ref 0.0–2.0)
Basophils Absolute: 0.1 10*3/uL (ref 0.0–0.1)
EOS%: 1.7 % (ref 0.0–7.0)
Eosinophils Absolute: 0.1 10*3/uL (ref 0.0–0.5)
HCT: 41.6 % (ref 34.8–46.6)
HGB: 13.8 g/dL (ref 11.6–15.9)
LYMPH%: 19.8 % (ref 14.0–49.7)
MCH: 29.8 pg (ref 25.1–34.0)
MCHC: 33 g/dL (ref 31.5–36.0)
MCV: 90.1 fL (ref 79.5–101.0)
MONO#: 0.8 10*3/uL (ref 0.1–0.9)
MONO%: 10.7 % (ref 0.0–14.0)
NEUT#: 5 10*3/uL (ref 1.5–6.5)
NEUT%: 66.8 % (ref 38.4–76.8)
Platelets: 186 10*3/uL (ref 145–400)
RBC: 4.62 10*6/uL (ref 3.70–5.45)
RDW: 13.8 % (ref 11.2–14.5)
WBC: 7.5 10*3/uL (ref 3.9–10.3)
lymph#: 1.5 10*3/uL (ref 0.9–3.3)

## 2014-01-10 LAB — CEA: CEA: 2 ng/mL (ref 0.0–5.0)

## 2014-01-13 ENCOUNTER — Encounter: Payer: Self-pay | Admitting: Gastroenterology

## 2014-01-14 ENCOUNTER — Other Ambulatory Visit: Payer: Medicare Other

## 2014-01-16 ENCOUNTER — Ambulatory Visit (INDEPENDENT_AMBULATORY_CARE_PROVIDER_SITE_OTHER): Payer: Medicare Other | Admitting: *Deleted

## 2014-01-16 DIAGNOSIS — I4891 Unspecified atrial fibrillation: Secondary | ICD-10-CM

## 2014-01-16 DIAGNOSIS — Z5181 Encounter for therapeutic drug level monitoring: Secondary | ICD-10-CM

## 2014-01-16 LAB — POCT INR: INR: 1.8

## 2014-01-20 ENCOUNTER — Ambulatory Visit (HOSPITAL_BASED_OUTPATIENT_CLINIC_OR_DEPARTMENT_OTHER): Payer: Medicare Other

## 2014-01-20 ENCOUNTER — Telehealth: Payer: Self-pay | Admitting: Physician Assistant

## 2014-01-20 ENCOUNTER — Encounter: Payer: Self-pay | Admitting: Physician Assistant

## 2014-01-20 ENCOUNTER — Ambulatory Visit (HOSPITAL_BASED_OUTPATIENT_CLINIC_OR_DEPARTMENT_OTHER): Payer: Medicare Other | Admitting: Physician Assistant

## 2014-01-20 ENCOUNTER — Telehealth: Payer: Self-pay

## 2014-01-20 VITALS — BP 175/89 | HR 101 | Temp 98.2°F | Resp 18 | Ht 63.0 in | Wt 181.6 lb

## 2014-01-20 DIAGNOSIS — C180 Malignant neoplasm of cecum: Secondary | ICD-10-CM

## 2014-01-20 DIAGNOSIS — R17 Unspecified jaundice: Secondary | ICD-10-CM

## 2014-01-20 DIAGNOSIS — E119 Type 2 diabetes mellitus without complications: Secondary | ICD-10-CM

## 2014-01-20 LAB — COMPREHENSIVE METABOLIC PANEL (CC13)
ALK PHOS: 80 U/L (ref 40–150)
ALT: 20 U/L (ref 0–55)
AST: 19 U/L (ref 5–34)
Albumin: 3.6 g/dL (ref 3.5–5.0)
Anion Gap: 8 mEq/L (ref 3–11)
BILIRUBIN TOTAL: 0.82 mg/dL (ref 0.20–1.20)
BUN: 13.8 mg/dL (ref 7.0–26.0)
CO2: 27 mEq/L (ref 22–29)
Calcium: 9.5 mg/dL (ref 8.4–10.4)
Chloride: 108 mEq/L (ref 98–109)
Creatinine: 0.8 mg/dL (ref 0.6–1.1)
Glucose: 150 mg/dl — ABNORMAL HIGH (ref 70–140)
Potassium: 4.3 mEq/L (ref 3.5–5.1)
Sodium: 143 mEq/L (ref 136–145)
Total Protein: 6.5 g/dL (ref 6.4–8.3)

## 2014-01-20 NOTE — Progress Notes (Signed)
ID: Anita Duke   DOB: Jan 28, 1941  MR#: [id number]  CSN#:[id number]  PCP: Alesia Richards, MD GYN:  SU: Stark Klein, MD OTHER MD: Daryel November, MD;  Erskine Emery, MD  CHIEF COMPLAINT: History of cecal adenocarcinoma   HISTORY OF PRESENT ILLNESS: Anita Duke had routine colonoscopy to screening under Alben Spittle 11/14/2012. This showed a sessile polyp in the cecum measuring at least 2 cm. There was a second polyp in the transverse colon. 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  The patient was staged with CT scans of the chest abdomen and pelvis 11/19/2012, and these were negative. She proceeded to partial colectomy under Stark Klein 12/03/2012. 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 Margins were negative. Biopsy of an incidentally noted liver nodule showed benign hyalinized tissue  Patient's subsequent history is as detailed below  INTERVAL HISTORY: Anita Duke returns today accompanied by her husband [name] for her history of stage I cecal adenocarcinoma.  She is feeling very well, and is here for labs and physical exam today. She had a repeat colonoscopy earlier this month which was notable only for 2 polyps, with pathology showing a tubular adenomas with no evidence of high-grade dysplasia or malignancy. It was recommended that a colonoscopy be repeated in 3 years.  Of note, [name] himself has completed his chemotherapy and radiation for head and neck cancer and is doing well. Anita Duke admits that it has been a "rough year".   REVIEW OF SYSTEMS: Physically, Anita Duke has no complaints today and is feeling well. She has had no recent illnesses  and denies any fevers or chills. She's had no rashes or skin changes and denies abnormal bruising or bleeding. Her energy level is fairly good, as is her appetite. She's had no abdominal pain. She's had no nausea or emesis and denies any change in bowel or bladder habits. She was having some diarrhea, but this has resolved. She denies any evidence of blood in the stool. She's had no increased cough, shortness of breath, peripheral swelling, chest pain, or palpitations. She's had no abnormal headaches or dizziness and denies any change in vision. She also denies any unusual myalgias, arthralgias, bony pain, or peripheral swelling.  Otherwise, detailed review of systems is stable and noncontributory.   PAST MEDICAL HISTORY: Past Medical History  Diagnosis Date  . Mitral regurgitation     Treated with repair, atrial appendage was removed or tied off at surgery.. this was proven by TEE February, 2011  . Pulmonary HTN     55 mmHg, echo, February, 2011 / no mention of pulmonary hypertension echo, June, 2011  . Right ventricular dysfunction     Mild to moderate, echo, February, 2011 / normalized echo, June, 2011  . TR (tricuspid regurgitation)     Moderate, echo, February, 2011 / trivial, echo, June, 2011  . Mitral stenosis     Mild, February, 2011, post mitral valve repair / mild, echo, June, 2011  . CAD (coronary artery disease)     LIMA to the LAD at time of mitral valve repair / LIMA atretic,, February, 2011  . Ejection fraction < 50%     EF 30% echo and TEE diagnosis February, 2011, possibly rate related /  no contraction by catheter, bradycardia so carvedilol cannot be used / EF 55-60% echo, June, 2011 / EF 50%, echo, February, 2012  . Hemorrhoids, internal   . Diverticulosis of colon   . Colon polyp, hyperplastic   . H/O amiodarone therapy     Satrted 02/12/2012  . High cholesterol   . Asthma   . COPD (chronic obstructive pulmonary disease)   . HTN (hypertension)     takes Mavik daily   . Myocardial infarction 04/2012  ?  . CHF (congestive heart failure) 2011    Mild in hospital February, 2011;takes Lasix daily  . Seasonal allergies     takes Claritin daily  . Type II diabetes mellitus     takes Metformin daily  . Cataracts, bilateral     immature  . Depression     takes Effexor daily  . Atrial fibrillation     Treated with maze procedure / recurrent atrial fibrillation February, 2011... TEE cardioversion October 19, 2009; s/p AVN ablatin  . QT prolongation     Tikosyn and Effexor. QT prolonged October 13, 2011, peak is in dose reduced from 500  to -250 twice a day  . Anemia     Hemoglobin 10.4, December, 2013  . GERD (gastroesophageal reflux disease)   . Arthritis     "not bad; little in my hands; some in my knees" (11/04/2013)  . Cecal cancer 2014    PAST SURGICAL HISTORY: Past Surgical History  Procedure Laterality Date  . Mitral valve repair  2001    "anterior and posterior leaflets" (12/04/2012)  . Maze  2001    w/ MVR & CABG  . Cardioversion  02/08/2012    Procedure: CARDIOVERSION;  Surgeon: Carlena Bjornstad, MD;  Location: [facility];  Service: Cardiovascular;  Laterality: N/A;  . Cardioversion  02/29/2012    Procedure: CARDIOVERSION;  Surgeon: Carlena Bjornstad, MD;  Location: [facility];  Service: Cardiovascular;  Laterality: N/A;  . Cardioversion  05/15/2012    Procedure: CARDIOVERSION;  Surgeon: Carlena Bjornstad, MD;  Location[facility];  Service: Cardiovascular;  Laterality: N/A;  . Tonsillectomy and adenoidectomy  ~ 1950  . 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 Av node ablation  05/28/12  . Cardiac catheterization  05/2012  . Laparoscopic right hemi colectomy  12/03/2012    Procedure: LAPAROSCOPIC RIGHT HEMI COLECTOMY;  Surgeon: Stark Klein, MD;  Location: [facility];  Service: General;;  . Umbilical hernia repair N/A 12/03/2012    Procedure: HERNIA REPAIR UMBILICAL ADULT;  Surgeon: Stark Klein, MD;  Location:  [facility];  Service: General;  Laterality: N/A;  . Diagnostic laparoscopic liver biopsy Left 12/03/2012    Procedure: DIAGNOSTIC LAPAROSCOPIC LIVER BIOPSY;  Surgeon: Stark Klein, MD;  Location: [facility];  Service: General;  Laterality: Left;  . Coronary artery bypass graft  2001    CABG X1 "at time of mitral valve repair" (12/04/2012  . Breast lumpectomy Bilateral 1990's  . Cholecystectomy  1995  . Abdominal hysterectomy  ?2002  . Bi-ventricular pacemaker upgrade  11/04/2013    upgrade of previously implanted dual chamber pacemaker to STJ Quadra Allure CRTP by Dr Rayann Heman    FAMILY HISTORY Family History  Problem Relation Age of Onset  . Pancreatic cancer Maternal Grandfather   . Heart disease Father   . Diabetes Father   . Heart disease Sister     x 2  . Diabetes Mother   .  Kidney disease Mother   . Colon cancer Neg Hx   . Diabetes Paternal Grandfather   . Diabetes Paternal Grandmother   . Heart disease Paternal Uncle     x 6  . Prostate cancer Paternal Uncle   . Colon polyps Neg Hx    the patient's father died from heart disease at the age of 74. The patient's mother died at the age of 38 with Alzheimer's disease. The patient has 2 sisters, no brothers. One of the patient's grand father's had pancreatic cancer diagnosed at age 26. The patient's mother's sister was diagnosed with colon cancer late in life.  GYNECOLOGIC HISTORY:  (Reviewed 01/20/2014) Menarche age 81, menopause approximately age 30. The patient took estrogen approximately 15 years. She is GX P0.  SOCIAL HISTORY:  (Updated 01/20/2014) Anita Duke works part-time at [employer]. Her husband Anita Duke is a retired Garment/textile technologist. He also taught school here in [place]: (Updated 01/20/2014) History  Substance Use Topics  . Smoking status: Never Smoker   . Smokeless tobacco: Never Used  . Alcohol Use: No     Colonoscopy: 01/06/2014, Dr. Angie Fava be  repeated in 3 years  PAP:  Not on file  Bone density: Not on file  Lipid panel:  March 2015  Allergies  Allergen Reactions  . Demerol [Meperidine] Nausea And Vomiting  . Morphine Other (See Comments)    vomiting  . Tetanus Toxoid Rash    "years ago"    Current Outpatient Prescriptions  Medication Sig Dispense Refill  . acetaminophen (TYLENOL) 500 MG tablet Take 1,000 mg by mouth every 6 (six) hours as needed. For pain      . aspirin (ECOTRIN LOW STRENGTH) 81 MG EC tablet Take 81 mg by mouth daily. Swallow whole.      Marland Kitchen atorvastatin (LIPITOR) 40 MG tablet Take 1 tablet (40 mg total) by mouth daily.  90 tablet  3  . budesonide-formoterol (SYMBICORT) 160-4.5 MCG/ACT inhaler Inhale 2 puffs into the lungs as needed.       . calcium carbonate (OS-CAL) 600 MG TABS Take 600 mg by mouth daily.       . carvedilol (COREG) 3.125 MG tablet Take 1 tablet (3.125 mg total) by mouth 2 (two) times daily with a meal.  180 tablet  3  . cholecalciferol (VITAMIN D) 1000 UNITS tablet Take 4,000 Units by mouth daily.      . fish oil-omega-3 fatty acids 1000 MG capsule Take 1 g by mouth 2 (two) times daily.       . furosemide (LASIX) 40 MG tablet Take 0.5 tablets (20 mg total) by mouth daily.  90 tablet  3  . loperamide (IMODIUM) 2 MG capsule Take 2 mg by mouth daily as needed for diarrhea or loose stools. prn      . loratadine (CLARITIN) 10 MG tablet Take 10 mg by mouth daily.      . metFORMIN (GLUCOPHAGE) 500 MG tablet Take 2 tablets (1,000 mg total) by mouth 2 (two) times daily with a meal.  360 tablet  1  . potassium chloride SA (KLOR-CON M20) 20 MEQ tablet Take 3 tablets (60 mEq total) by mouth daily.  90 tablet  3  . trandolapril (MAVIK) 4 MG tablet Take 8 mg by mouth daily.       Marland Kitchen venlafaxine XR (EFFEXOR-XR) 37.5 MG 24 hr capsule Take 37.5 mg by mouth every evening.      . warfarin (COUMADIN)  2.5 MG tablet Take as directed by  anticoagulation clinic  100 tablet  1   No current facility-administered  medications for this visit.    OBJECTIVE: Middle-aged white woman who appears comfortable and is in no acute distress Filed Vitals:   01/20/14 1250  BP: 175/89  Pulse: 101  Temp: 98.2 F (36.8 C)  Resp: 18     Body mass index is 32.18 kg/(m^2).    ECOG FS: 0 Filed Weights   01/20/14 1250  Weight: 181 lb 9.6 oz (82.373 kg)   Physical Exam: HEENT:  Sclerae anicteric.  Oropharynx clear and moist. Neck supple, trachea midline.  NODES:  No cervical, supraclavicular, or axillary lymphadenopathy palpated.  BREAST EXAM:  Deferred. LUNGS:  Clear to auscultation bilaterally.  No wheezes or rhonchi HEART:  Regular rate and rhythm. No murmur. ABDOMEN:  Soft, nontender to palpation. Nondistended. No organomegaly or masses palpated.  Positive bowel sounds.  MSK:  No focal spinal tenderness to palpation. Good range of motion bilaterally in both the upper and lower extremities. EXTREMITIES:  No peripheral edema.  No clubbing or cyanosis. SKIN:  No visible rashes. No excessive ecchymoses. No petechiae. No pallor. The skin turgor. NEURO:  Nonfocal. Well oriented.  Appropriate affect.    LAB RESULTS: Lab Results  Component Value Date   WBC 7.5 01/09/2014   NEUTROABS 5.0 01/09/2014   HGB 13.8 01/09/2014   HCT 41.6 01/09/2014   MCV 90.1 01/09/2014   PLT 186 01/09/2014      Chemistry      Component Value Date/Time   NA 143 01/20/2014 1453   NA 142 01/02/2014 1229   K 4.3 01/20/2014 1453   K 3.9 01/02/2014 1229   CL 104 01/02/2014 1229   CL 106 12/30/2012 1511   CO2 27 01/20/2014 1453   CO2 28 01/02/2014 1229   BUN 13.8 01/20/2014 1453   BUN 13 01/02/2014 1229   CREATININE 0.8 01/20/2014 1453   CREATININE 0.63 01/02/2014 1229   CREATININE 0.6 10/31/2013 1202      Component Value Date/Time   CALCIUM 9.5 01/20/2014 1453   CALCIUM 9.4 01/02/2014 1229   ALKPHOS 80 01/20/2014 1453   ALKPHOS 79 01/02/2014 1229   AST 19 01/20/2014 1453   AST 20 01/02/2014 1229   ALT 20 01/20/2014 1453   ALT 17 01/02/2014 1229   BILITOT  0.82 01/20/2014 1453   BILITOT 1.6* 01/02/2014 1229      CEA   2.0  01/09/2014    2.4  01/02/2014    2.0  11/26/2012     STUDIES: Mm Digital Diagnostic Unilat R 01/07/2014   CLINICAL DATA:  Screening recall for a possible right breast mass.  EXAM: DIGITAL DIAGNOSTIC  RIGHT MAMMOGRAM WITH CAD  ULTRASOUND RIGHT BREAST  COMPARISON:  Previous exams.  ACR Breast Density Category c: The breast tissue is heterogeneously dense, which may obscure small masses.  FINDINGS: The initially questioned possible mass seen in the slightly medial right breast on the CC view only appears to resolves on the additional spot compression CC view with no definite correlate on the true lateral view.  Mammographic images were processed with CAD.  Physical examination of the medial right breast does not reveal any palpable masses.  Targeted ultrasound of the right breast was performed demonstrating an oval well-circumscribed anechoic mass with posterior acoustic enhancement at 230 3 cm from the nipple measuring 0.5 x 0.3 x 0.5 cm compatible with a simple cyst. This may correspond with the mammographic  abnormality. Several additional smaller cysts were seen in the medial right breast. The central and medial right breast was scanned with no suspicious findings seen.  IMPRESSION: Initially questioned possible right breast mass appears to resolve on the additional imaging, with a benign cyst seen in the medial right breast on targeted ultrasound. There is no mammographic evidence of malignancy in the right breast.  RECOMMENDATION: Screening mammogram in one year.(Code:SM-B-01Y)  I have discussed the findings and recommendations with the patient. Results were also provided in writing at the conclusion of the visit. If applicable, a reminder letter will be sent to the patient regarding the next appointment.  BI-RADS CATEGORY  2: Benign.   Electronically Signed   By: Everlean Alstrom M.D.   On: 01/07/2014 11:13   US Breast Ltd Uni Right Inc  Axilla 01/07/2014   CLINICAL DATA:  Screening recall for a possible right breast mass.  EXAM: DIGITAL DIAGNOSTIC  RIGHT MAMMOGRAM WITH CAD  ULTRASOUND RIGHT BREAST  COMPARISON:  Previous exams.  ACR Breast Density Category c: The breast tissue is heterogeneously dense, which may obscure small masses.  FINDINGS: The initially questioned possible mass seen in the slightly medial right breast on the CC view only appears to resolves on the additional spot compression CC view with no definite correlate on the true lateral view.  Mammographic images were processed with CAD.  Physical examination of the medial right breast does not reveal any palpable masses.  Targeted ultrasound of the right breast was performed demonstrating an oval well-circumscribed anechoic mass with posterior acoustic enhancement at 230 3 cm from the nipple measuring 0.5 x 0.3 x 0.5 cm compatible with a simple cyst. This may correspond with the mammographic abnormality. Several additional smaller cysts were seen in the medial right breast. The central and medial right breast was scanned with no suspicious findings seen.  IMPRESSION: Initially questioned possible right breast mass appears to resolve on the additional imaging, with a benign cyst seen in the medial right breast on targeted ultrasound. There is no mammographic evidence of malignancy in the right breast.  RECOMMENDATION: Screening mammogram in one year.(Code:SM-B-01Y)  I have discussed the findings and recommendations with the patient. Results were also provided in writing at the conclusion of the visit. If applicable, a reminder letter will be sent to the patient regarding the next appointment.  BI-RADS CATEGORY  2: Benign.   Electronically Signed   By: Everlean Alstrom M.D.   On: 01/07/2014 11:13   Mm Screening Breast Tomo Bilateral 12/23/2013   CLINICAL DATA:  Screening.  EXAM: DIGITAL SCREENING BILATERAL MAMMOGRAM WITH 3D TOMO WITH CAD  COMPARISON:  Previous Exam(s)  ACR Breast  Density Category c: The breast tissue is heterogeneously dense, which may obscure small masses.  FINDINGS: In the right breast, a possible asymmetry warrants further evaluation with spot compression views and possibly ultrasound. In the left breast, no findings suspicious for malignancy. Images were processed with CAD.  IMPRESSION: Further evaluation is suggested for possible asymmetry in the right breast.  RECOMMENDATION: Diagnostic mammogram and possibly ultrasound of the right breast. (Code:[id number])  The patient will be contacted regarding the findings, and additional imaging will be scheduled.  BI-RADS CATEGORY  0: Incomplete. Need additional imaging evaluation and/or prior mammograms for comparison.   Electronically Signed   By: Abelardo Diesel M.D.   On: 12/23/2013 11:20     ASSESSMENT: 73 y.o. [place] woman   (1)  s/p partial colectomy 12/03/2012 for a moderately to poorly differentiated cecal  adenocarcinoma measuring 2.2 cm, invading into but not through the muscularis propria, with 0 of 12 lymph nodes involved, and so pT2 pN0 or stage I  (2)  history of iron deficiency anemia, resolved  (3)  elevated serum total bilirubin noted on labs in May 2015, resolved as of 01/20/2014  (4)  Patient has been tested for Lynch syndrome and was negative.  PLAN:  Dr. Jana Hakim reviewed when this case, and she does appear to be doing quite well with regards to her history of cecal adenocarcinoma. He is still comfortable with allowing her to "graduate" from followup at this time. She'll continue to be followed regularly to her primary care physician (Dr. Melford Aase) and her gastroenterologist (Dr. Deatra Ina).    I will send notes to both physicians updating them on Anamari's progress. Dr. Jana Hakim has also suggested that Anita Duke have her total bilirubin followed through her primary care physician due to the recent elevation. Fortunately, this has resolved as of today's labs, and all of her liver enzymes were normal  today.  This will be left to Dr. Idell Pickles discretion.  Of course he also continues to follow Idamay's history of diabetes.  Otherwise, Ambrie knows that we will keep her records on file for at least 10 years. If she has any problems in the future we will be glad to see her back for reevaluation. She voices both her understanding and her agreement with our plan as stated above.   Menachem Urbanek Milda Smart PA-C    01/20/2014

## 2014-01-20 NOTE — Telephone Encounter (Signed)
LMOVM pt to return call.   

## 2014-01-20 NOTE — Telephone Encounter (Signed)
perpof to add on lab for pt per pof & AB

## 2014-01-21 ENCOUNTER — Ambulatory Visit: Payer: Medicare Other | Admitting: Physician Assistant

## 2014-01-21 NOTE — Telephone Encounter (Signed)
Pt called and LM  LMOVM - pt to return call.

## 2014-01-21 NOTE — Telephone Encounter (Signed)
Per Micah Flesher, let patient know that her bilirubin results were back to normal.  Pt voiced understanding.

## 2014-01-22 ENCOUNTER — Other Ambulatory Visit: Payer: Self-pay | Admitting: Physician Assistant

## 2014-01-23 ENCOUNTER — Other Ambulatory Visit: Payer: Self-pay | Admitting: Internal Medicine

## 2014-01-23 MED ORDER — VENLAFAXINE HCL ER 37.5 MG PO CP24: 37.5000 mg | ORAL_CAPSULE | Freq: Every evening | ORAL | Status: DC

## 2014-01-30 LAB — PROTIME-INR: INR: 2.8 — AB (ref 0.9–1.1)

## 2014-02-02 ENCOUNTER — Telehealth: Payer: Self-pay | Admitting: Cardiology

## 2014-02-02 ENCOUNTER — Ambulatory Visit (INDEPENDENT_AMBULATORY_CARE_PROVIDER_SITE_OTHER): Payer: Medicare Other | Admitting: Pharmacist

## 2014-02-02 DIAGNOSIS — I4891 Unspecified atrial fibrillation: Secondary | ICD-10-CM

## 2014-02-02 DIAGNOSIS — Z5181 Encounter for therapeutic drug level monitoring: Secondary | ICD-10-CM

## 2014-02-02 NOTE — Telephone Encounter (Signed)
Patient notified to continue warfarin 2.5 mg qd as her INR was 2.8 by [facility] on 01/30/14.  Recheck INR in 3 weeks.  Appointment made.

## 2014-02-02 NOTE — Telephone Encounter (Signed)
New message     Pt is out of town---[facility] drew her blood and faxed the results to [name].  She has not heard from [name] to get her PT/INR results

## 2014-02-04 ENCOUNTER — Encounter: Payer: Medicare Other | Admitting: Internal Medicine

## 2014-02-11 ENCOUNTER — Encounter: Payer: Self-pay | Admitting: Internal Medicine

## 2014-02-11 ENCOUNTER — Ambulatory Visit (INDEPENDENT_AMBULATORY_CARE_PROVIDER_SITE_OTHER): Payer: Medicare Other | Admitting: Internal Medicine

## 2014-02-11 VITALS — BP 126/82 | HR 79 | Ht 63.0 in | Wt 183.2 lb

## 2014-02-11 DIAGNOSIS — I519 Heart disease, unspecified: Secondary | ICD-10-CM

## 2014-02-11 DIAGNOSIS — I4891 Unspecified atrial fibrillation: Secondary | ICD-10-CM

## 2014-02-11 DIAGNOSIS — I442 Atrioventricular block, complete: Secondary | ICD-10-CM

## 2014-02-11 LAB — MDC_IDC_ENUM_SESS_TYPE_INCLINIC
Battery Voltage: 2.99 V
Implantable Pulse Generator Model: 3242
Implantable Pulse Generator Serial Number: 7548835
Lead Channel Impedance Value: 680 Ohm
Lead Channel Pacing Threshold Amplitude: 1.75 V
Lead Channel Pacing Threshold Pulse Width: 0.5 ms
Lead Channel Pacing Threshold Pulse Width: 0.6 ms
Lead Channel Setting Pacing Amplitude: 2.5 V
MDC IDC MSMT LEADCHNL RV IMPEDANCE VALUE: 480 Ohm
MDC IDC MSMT LEADCHNL RV PACING THRESHOLD AMPLITUDE: 0.75 V
MDC IDC SET LEADCHNL RV PACING PULSEWIDTH: 0.4 ms
MDC IDC SET LEADCHNL RV SENSING SENSITIVITY: 5.6 mV
MDC IDC STAT BRADY RV PERCENT PACED: 97 %

## 2014-02-11 LAB — T4, FREE: FREE T4: 0.83 ng/dL (ref 0.60–1.60)

## 2014-02-11 LAB — BASIC METABOLIC PANEL
BUN: 12 mg/dL (ref 6–23)
CHLORIDE: 106 meq/L (ref 96–112)
CO2: 30 mEq/L (ref 19–32)
Calcium: 9.3 mg/dL (ref 8.4–10.5)
Creatinine, Ser: 0.5 mg/dL (ref 0.4–1.2)
GFR: 131.18 mL/min (ref >60.00)
Glucose, Bld: 149 mg/dL — ABNORMAL HIGH (ref 70–99)
Potassium: 4.2 mEq/L (ref 3.5–5.1)
SODIUM: 141 meq/L (ref 135–145)

## 2014-02-11 LAB — TSH: TSH: 0.13 u[IU]/mL — AB (ref 0.35–4.50)

## 2014-02-11 MED ORDER — CARVEDILOL 6.25 MG PO TABS: 6.2500 mg | ORAL_TABLET | Freq: Two times a day (BID) | ORAL | Status: DC

## 2014-02-11 NOTE — Progress Notes (Signed)
PCP: Anita Richards, MD Primary Cardiologist:  Anita Duke is a 73 y.o. female who presents today for routine electrophysiology followup.   Since her CRT-P upgrade, her exercise tolerance/ SOB/ fatigue are improved.  She continues to have SOB with moderate activity such as ambulating up an incline or with heavy lifting. Today, she denies symptoms of palpitations, chest pain, lower extremity edema, dizziness, presyncope, or syncope.  The patient is otherwise without complaint today.   Past Medical History  Diagnosis Date  . Mitral regurgitation     Treated with repair, atrial appendage was removed or tied off at surgery.. this was proven by TEE February, 2011  . Pulmonary HTN     55 mmHg, echo, February, 2011 / no mention of pulmonary hypertension echo, June, 2011  . Right ventricular dysfunction     Mild to moderate, echo, February, 2011 / normalized echo, June, 2011  . TR (tricuspid regurgitation)     Moderate, echo, February, 2011 / trivial, echo, June, 2011  . Mitral stenosis     Mild, February, 2011, post mitral valve repair / mild, echo, June, 2011  . CAD (coronary artery disease)     LIMA to the LAD at time of mitral valve repair / LIMA atretic,, February, 2011  . Ejection fraction < 50%     EF 30% echo and TEE diagnosis February, 2011, possibly rate related / no contraction by catheter, bradycardia so carvedilol cannot be used / EF 55-60% echo, June, 2011 / EF 50%, echo, February, 2012  . Hemorrhoids, internal   . Diverticulosis of colon   . Colon polyp, hyperplastic   . H/O amiodarone therapy     Satrted 02/12/2012  . High cholesterol   . Asthma   . COPD (chronic obstructive pulmonary disease)   . HTN (hypertension)     takes Mavik daily  . Myocardial infarction 04/2012  ?  . CHF (congestive heart failure) 2011    Mild in hospital February, 2011;takes Lasix daily  . Seasonal allergies     takes Claritin daily  . Type II diabetes mellitus     takes  Metformin daily  . Cataracts, bilateral     immature  . Depression     takes Effexor daily  . Atrial fibrillation     Treated with maze procedure / recurrent atrial fibrillation February, 2011... TEE cardioversion October 19, 2009; s/p AVN ablatin  . QT prolongation     Tikosyn and Effexor. QT prolonged October 13, 2011, peak is in dose reduced from 500  to -250 twice a day  . Anemia     Hemoglobin 10.4, December, 2013  . GERD (gastroesophageal reflux disease)   . Arthritis     "not bad; little in my hands; some in my knees" (11/04/2013)  . Cecal cancer 2014   Past Surgical History  Procedure Laterality Date  . Mitral valve repair  2001    "anterior and posterior leaflets" (12/04/2012)  . Maze  2001    w/ MVR & CABG  . Cardioversion  02/08/2012    Procedure: CARDIOVERSION;  Surgeon: Carlena Bjornstad, MD;  Location: [facility];  Service: Cardiovascular;  Laterality: N/A;  . Cardioversion  02/29/2012    Procedure: CARDIOVERSION;  Surgeon: Carlena Bjornstad, MD;  Location: [facility];  Service: Cardiovascular;  Laterality: N/A;  . Cardioversion  05/15/2012    Procedure: CARDIOVERSION;  Surgeon: Carlena Bjornstad, MD;  Location: [facility];  Service: Cardiovascular;  Laterality: N/A;  .  Tonsillectomy and adenoidectomy  ~ 1950  . Pacemaker insertion  05/17/2012    MDT Adapta L implanted by Anita Rayann Heman for tachy/brady syndrome  . Av node ablation  05/28/12  . Cardiac catheterization  05/2012  . Laparoscopic right hemi colectomy  12/03/2012    Procedure: LAPAROSCOPIC RIGHT HEMI COLECTOMY;  Surgeon: Stark Klein, MD;  Location: [facility];  Service: General;;  . Umbilical hernia repair N/A 12/03/2012    Procedure: HERNIA REPAIR UMBILICAL ADULT;  Surgeon: Stark Klein, MD;  Location: [facility];  Service: General;  Laterality: N/A;  . Diagnostic laparoscopic liver biopsy Left 12/03/2012    Procedure: DIAGNOSTIC LAPAROSCOPIC LIVER BIOPSY;  Surgeon: Stark Klein, MD;  Location: [facility];  Service: General;  Laterality: Left;  .  Coronary artery bypass graft  2001    CABG X1 "at time of mitral valve repair" (12/04/2012  . Breast lumpectomy Bilateral 1990's  . Cholecystectomy  1995  . Abdominal hysterectomy  ?2002  . Bi-ventricular pacemaker upgrade  11/04/2013    upgrade of previously implanted dual chamber pacemaker to STJ Quadra Allure CRTP by Anita Rayann Heman    Current Outpatient Prescriptions  Medication Sig Dispense Refill  . acetaminophen (TYLENOL) 500 MG tablet Take 1,000 mg by mouth every 6 (six) hours as needed. For pain      . aspirin (ECOTRIN LOW STRENGTH) 81 MG EC tablet Take 81 mg by mouth daily. Swallow whole.      Marland Kitchen atorvastatin (LIPITOR) 40 MG tablet Take 1 tablet (40 mg total) by mouth daily.  90 tablet  3  . budesonide-formoterol (SYMBICORT) 160-4.5 MCG/ACT inhaler Inhale 2 puffs into the lungs as needed.       . calcium carbonate (OS-CAL) 600 MG TABS Take 600 mg by mouth daily.       . carvedilol (COREG) 3.125 MG tablet Take 1 tablet (3.125 mg total) by mouth 2 (two) times daily with a meal.  180 tablet  3  . cholecalciferol (VITAMIN D) 1000 UNITS tablet Take 4,000 Units by mouth daily.      . fish oil-omega-3 fatty acids 1000 MG capsule Take 1 g by mouth 2 (two) times daily.       . furosemide (LASIX) 40 MG tablet Take 0.5 tablets (20 mg total) by mouth daily.  90 tablet  3  . loperamide (IMODIUM) 2 MG capsule Take 2 mg by mouth daily as needed for diarrhea or loose stools.       Marland Kitchen loratadine (CLARITIN) 10 MG tablet Take 10 mg by mouth daily.      . metFORMIN (GLUCOPHAGE) 500 MG tablet Take 2 tablets by mouth  twice a day with meals  360 tablet  1  . potassium chloride SA (KLOR-CON M20) 20 MEQ tablet Take 3 tablets (60 mEq total) by mouth daily.  90 tablet  3  . trandolapril (MAVIK) 4 MG tablet Take 8 mg by mouth daily.       Marland Kitchen venlafaxine XR (EFFEXOR-XR) 37.5 MG 24 hr capsule Take 1 capsule (37.5 mg total) by mouth every evening.  90 capsule  0  . warfarin (COUMADIN) 2.5 MG tablet Take as directed by   anticoagulation clinic  100 tablet  1   No current facility-administered medications for this visit.   ROS- all systems are reviewed and negative except as per HPI above  Physical Exam: Filed Vitals:   02/11/14 1137  BP: 126/82  Pulse: 79  Height: 5\' 3"  (1.6 m)  Weight: 183 lb 3.2 oz (83.099 kg)  GEN- The patient is alert and oriented x 3 today.    Head- normocephalic, atraumatic Eyes-  Sclera clear, conjunctiva pink Ears- hearing intact Oropharynx- clear Lungs- Clear to ausculation bilaterally, normal work of breathing Chest- pacemaker pocket is well healed Heart- Regular rate and rhythm (paced) GI- soft, NT, ND, + BS Extremities- no clubbing, cyanosis, or edema  Pacemaker interrogation- reviewed in detail today,  See PACEART report ekg today reveals afib, V pacing with frequent PVCs  Assessment and Plan:  1. Chronic systolic dysfunction Increase coreg to 6.25mg  BID today Normal BiV pacemaker function See Pace Art report No changes today Repeat echo 6 months s/p CRT bmet today  2. Permanent afib Continue long term anticoagulation Stop ASA if ok with Anita Ron Parker  3. Complete heart block Normal pacemaker function See Pace Art report No changes today  4. CAD Stable No change required today  5. Heat intolerance She reports that she is frequently very hot-->no fevers Check TFTs today Follow-up with primary care if not improved

## 2014-02-11 NOTE — Patient Instructions (Addendum)
Remote monitoring is used to monitor your Pacemaker of ICD from home. This monitoring reduces the number of office visits required to check your device to one time per year. It allows [name] to keep an eye on the functioning of your device to ensure it is working properly. You are scheduled for a device check from home on May 13, 2014. You may send your transmission at any time that day. If you have a wireless device, the transmission will be sent automatically. After your physician reviews your transmission, you will receive a postcard with your next transmission date.  Your physician wants you to follow-up in: 1 year with Dr Rayann Heman.  You will receive a reminder letter in the mail two months in advance. If you don't receive a letter, please call our office to schedule the follow-up appointment.   Your physician has requested that you have an echocardiogram. Echocardiography is a painless test that uses sound waves to create images of your heart. It provides your doctor with information about the size and shape of your heart and how well your heart's chambers and valves are working. This procedure takes approximately one hour. There are no restrictions for this procedure.--in 05/2014  Your physician has recommended you make the following change in your medication:  1) Increase Carvedilol to 6.25mg  twice daily  Your physician recommends that you return for lab work today: TSH/T4/BMP

## 2014-02-19 ENCOUNTER — Encounter: Payer: Self-pay | Admitting: Internal Medicine

## 2014-02-20 ENCOUNTER — Encounter: Payer: Self-pay | Admitting: Internal Medicine

## 2014-02-20 ENCOUNTER — Ambulatory Visit (INDEPENDENT_AMBULATORY_CARE_PROVIDER_SITE_OTHER): Payer: Medicare Other | Admitting: Internal Medicine

## 2014-02-20 ENCOUNTER — Ambulatory Visit: Payer: Self-pay | Admitting: Internal Medicine

## 2014-02-20 VITALS — BP 126/70 | HR 76 | Temp 97.6°F | Resp 16 | Ht 62.0 in | Wt 179.8 lb

## 2014-02-20 DIAGNOSIS — I10 Essential (primary) hypertension: Secondary | ICD-10-CM

## 2014-02-20 DIAGNOSIS — Z79899 Other long term (current) drug therapy: Secondary | ICD-10-CM | POA: Insufficient documentation

## 2014-02-20 DIAGNOSIS — E559 Vitamin D deficiency, unspecified: Secondary | ICD-10-CM

## 2014-02-20 DIAGNOSIS — E782 Mixed hyperlipidemia: Secondary | ICD-10-CM

## 2014-02-20 DIAGNOSIS — E119 Type 2 diabetes mellitus without complications: Secondary | ICD-10-CM

## 2014-02-20 HISTORY — DX: Vitamin D deficiency, unspecified: E55.9

## 2014-02-20 LAB — HEMOGLOBIN A1C
Hgb A1c MFr Bld: 7 % — ABNORMAL HIGH (ref <5.7)
MEAN PLASMA GLUCOSE: 154 mg/dL — AB (ref <117)

## 2014-02-20 NOTE — Progress Notes (Signed)
Patient ID: Anita Duke, female   DOB: Dec 24, 1940, 73 y.o.   MRN: [id number]    This very nice 73 y.o.MWF presents for 3 month follow up with Hypertension, Hyperlipidemia, Pre-Diabetes and Vitamin D Deficiency.  Pertinent Hx is that of patient undergoing a partial colectomy for Colon Cancer in April by Dr Mamie Laurel with clean resection and no mets evident.   HTN predates since [place] was initiated until 1990. BP has been controlled at home. Today's BP: 126/70 mmHg. Patient has ASCAD and is post CABG & AoVR w/ Hx/o Afib & has a Permanent pacemaker. Patient is on chronic coumadin therapy.  Patient denies any cardiac type chest pain, palpitations, dyspnea/orthopnea/PND, dizziness, claudication, or dependent edema.   Hyperlipidemia is controlled with diet & meds. Last Cholesterol / LDL were at goal, but Triglycerides were still high & patient was advised stricter diet. Patient denies myalgias or other med SE's.  Lab Results  Component Value Date   CHOL 145 02/20/2014   HDL 50 02/20/2014   LDLCALC 62 02/20/2014   TRIG 165* 02/20/2014   CHOLHDL 2.9 02/20/2014    Also, the patient has history of T2_NIDDM since 2005 and last A1c was 6.8% in Mar 2015. Patient denies any symptoms of reactive hypoglycemia, diabetic polys, paresthesias or visual blurring.   Further, Patient has history of Vitamin D Deficiency of 16 in 2012 with last vitamin D of 82 in Mar 2015. Patient supplements vitamin D without any suspected side-effects.   Medication List   acetaminophen 500 MG tablet  Commonly known as:  TYLENOL  Take 1,000 mg by mouth every 6 (six) hours as needed. For pain     atorvastatin 40 MG tablet  Commonly known as:  LIPITOR  Take 1 tablet (40 mg total) by mouth daily.     budesonide-formoterol 160-4.5 MCG/ACT inhaler  Commonly known as:  SYMBICORT  Inhale 2 puffs into the lungs as needed.     calcium carbonate 600 MG Tabs tablet  Commonly known as:  OS-CAL  Take 600 mg by mouth daily.      carvedilol 6.25 MG tablet  Commonly known as:  COREG  Take 1 tablet (6.25 mg total) by mouth 2 (two) times daily with a meal.     cholecalciferol 1000 UNITS tablet  Commonly known as:  VITAMIN D  Take 4,000 Units by mouth daily.     ECOTRIN LOW STRENGTH 81 MG EC tablet  Generic drug:  aspirin  Take 81 mg by mouth daily. Swallow whole.     fish oil-omega-3 fatty acids 1000 MG capsule  Take 1 g by mouth 2 (two) times daily.     furosemide 40 MG tablet  Commonly known as:  LASIX  Take 0.5 tablets (20 mg total) by mouth daily.     loperamide 2 MG capsule  Commonly known as:  IMODIUM  Take 2 mg by mouth daily as needed for diarrhea or loose stools.     loratadine 10 MG tablet  Commonly known as:  CLARITIN  Take 10 mg by mouth daily.     metFORMIN 500 MG tablet  Commonly known as:  GLUCOPHAGE  Take 2 tablets by mouth  twice a day with meals     potassium chloride SA 20 MEQ tablet  Commonly known as:  KLOR-CON M20  Take 3 tablets (60 mEq total) by mouth daily.     trandolapril 4 MG tablet  Commonly known as:  MAVIK  Take 8 mg by mouth  daily.     venlafaxine XR 37.5 MG 24 hr capsule  Commonly known as:  EFFEXOR-XR  Take 1 capsule (37.5 mg total) by mouth every evening.     warfarin 2.5 MG tablet  Commonly known as:  COUMADIN  Take as directed by  anticoagulation clinic     Allergies  Allergen Reactions  . Demerol [Meperidine] Nausea And Vomiting  . Morphine Other (See Comments)    vomiting  . Tetanus Toxoid Rash    "years ago"   PMHx:   Past Medical History  Diagnosis Date  . Mitral regurgitation     Treated with repair, atrial appendage was removed or tied off at surgery.. this was proven by TEE February, 2011  . Pulmonary HTN     55 mmHg, echo, February, 2011 / no mention of pulmonary hypertension echo, June, 2011  . Right ventricular dysfunction     Mild to moderate, echo, February, 2011 / normalized echo, June, 2011  . TR (tricuspid regurgitation)      Moderate, echo, February, 2011 / trivial, echo, June, 2011  . Mitral stenosis     Mild, February, 2011, post mitral valve repair / mild, echo, June, 2011  . CAD (coronary artery disease)     LIMA to the LAD at time of mitral valve repair / LIMA atretic,, February, 2011  . Ejection fraction < 50%     EF 30% echo and TEE diagnosis February, 2011, possibly rate related / no contraction by catheter, bradycardia so carvedilol cannot be used / EF 55-60% echo, June, 2011 / EF 50%, echo, February, 2012  . Hemorrhoids, internal   . Diverticulosis of colon   . Colon polyp, hyperplastic   . H/O amiodarone therapy     Satrted 02/12/2012  . High cholesterol   . Asthma   . COPD (chronic obstructive pulmonary disease)   . HTN (hypertension)     takes Mavik daily  . Myocardial infarction 04/2012  ?  . CHF (congestive heart failure) 2011    Mild in hospital February, 2011;takes Lasix daily  . Seasonal allergies     takes Claritin daily  . Type II diabetes mellitus     takes Metformin daily  . Cataracts, bilateral     immature  . Depression     takes Effexor daily  . Atrial fibrillation     Treated with maze procedure / recurrent atrial fibrillation February, 2011... TEE cardioversion October 19, 2009; s/p AVN ablatin  . QT prolongation     Tikosyn and Effexor. QT prolonged October 13, 2011, peak is in dose reduced from 500  to -250 twice a day  . Anemia     Hemoglobin 10.4, December, 2013  . GERD (gastroesophageal reflux disease)   . Arthritis     "not bad; little in my hands; some in my knees" (11/04/2013)  . Cecal cancer 2014   FHx:    Reviewed / unchanged  SHx:    Reviewed / unchanged   Systems Review: Constitutional: Denies fever, chills, wt changes, headaches, insomnia, fatigue, night sweats, change in appetite. Eyes: Denies redness, blurred vision, diplopia, discharge, itchy, watery eyes.  ENT: Denies discharge, congestion, post nasal drip, epistaxis, sore throat, earache, hearing  loss, dental pain, tinnitus, vertigo, sinus pain, snoring.  CV: Denies chest pain, palpitations, irregular heartbeat, syncope, dyspnea, diaphoresis, orthopnea, PND, claudication or edema. Respiratory: denies cough, dyspnea, DOE, pleurisy, hoarseness, laryngitis, wheezing.  Gastrointestinal: Denies dysphagia, odynophagia, heartburn, reflux, water brash, abdominal pain or cramps, nausea, vomiting,  bloating, diarrhea, constipation, hematemesis, melena, hematochezia  or hemorrhoids. Genitourinary: Denies dysuria, frequency, urgency, nocturia, hesitancy, discharge, hematuria or flank pain. Musculoskeletal: Denies arthralgias, myalgias, stiffness, jt. swelling, pain, limping or strain/sprain.  Skin: Denies pruritus, rash, hives, warts, acne, eczema or change in skin lesion(s). Neuro: No weakness, tremor, incoordination, spasms, paresthesia or pain. Psychiatric: Denies confusion, memory loss or sensory loss. Endo: Denies change in weight, skin or hair change.  Heme/Lymph: No excessive bleeding, bruising or enlarged lymph nodes.  Exam:  BP 126/70  Pulse 76  Temp 97.6 F   Resp 16  Ht 5\' 2"    Wt 179 lb 12.8 oz   BMI 32.88 kg/m2  Appears well nourished - in no distress. Eyes: PERRLA, EOMs, conjunctiva no swelling or erythema. Sinuses: No frontal/maxillary tenderness ENT/Mouth: EAC's clear, TM's nl w/o erythema, bulging. Nares clear w/o erythema, swelling, exudates. Oropharynx clear without erythema or exudates. Oral hygiene is good. Tongue normal, non obstructing. Hearing intact.  Neck: Supple. Thyroid nl. Car 2+/2+ without bruits, nodes or JVD. Chest: Respirations nl with BS clear & equal w/o rales, rhonchi, wheezing or stridor.  Cor: Heart sounds normal w/ regular rate and rhythm without sig. murmurs, gallops, clicks, or rubs. Peripheral pulses normal and equal  without edema.  Abdomen: Soft & bowel sounds normal. Non-tender w/o guarding, rebound, hernias, masses, or organomegaly.  Lymphatics:  Unremarkable.  Musculoskeletal: Full ROM all peripheral extremities, joint stability, 5/5 strength, and normal gait.  Skin: Warm, dry without exposed rashes, lesions or ecchymosis apparent.  Neuro: Cranial nerves intact, reflexes equal bilaterally. Sensory-motor testing grossly intact. Tendon reflexes grossly intact.  Pysch: Alert & oriented x 3. Insight and judgement nl & appropriate. No ideations.  Assessment and Plan:  1. Hypertension - Continue monitor blood pressure at home. Continue diet/meds same.  2. Hyperlipidemia - Continue diet/meds, exercise,& lifestyle modifications. Continue monitor periodic cholesterol/liver & renal functions   3. T2_NIDDM - continue recommend prudent low glycemic diet, weight control, regular exercise, diabetic monitoring and periodic eye exams.  4. Vitamin D Deficiency - Continue supplementation.  5. ASCAD/Afib s/p CABG/AoVR  Recommended regular exercise, BP monitoring, weight control, and discussed med and SE's. Recommended labs to assess and monitor clinical status. Further disposition pending results of labs.

## 2014-02-20 NOTE — Patient Instructions (Signed)
Recommend the book "the END of DIETING" by Dr Joel Furman   Get the  Book "The END of DIABETES " by Dr Joel Fuhrman  At [employer] - get book & Audio CD's      Being diabetic has a  300% increased risk for heart attack, stroke, cancer, and alzheimer- type vascular dementia. It is very important that you work harder with diet by avoiding all foods that are white except chicken & fish. Avoid white rice (brown & wild rice is OK), white potatoes (sweetpotatoes in moderation is OK), White bread or wheat bread or anything made out of white flour like bagels, donuts, rolls, buns, biscuits, cakes, pastries, cookies, pizza crust, and pasta (made from white flour & egg whites) - vegetarian pasta or spinach or wheat pasta is OK. Multigrain breads like Arnold's or Pepperidge Farm, or multigrain sandwich thins or flatbreads.  Diet, exercise and weight loss can reverse and cure diabetes in the early stages.  Diet, exercise and weight loss is very important in the control and prevention of complications of diabetes which affects every system in your body, ie. Brain - dementia/stroke, eyes - glaucoma/blindness, heart - heart attack/heart failure, kidneys - dialysis, stomach - gastric paralysis, intestines - malabsorption, nerves - severe painful neuritis, circulation - gangrene & loss of a leg(s), and finally cancer and Alzheimers.    I recommend avoid fried & greasy foods,  sweets/candy, white rice (brown or wild rice or Quinoa is OK), white potatoes (sweet potatoes are OK) - anything made from white flour - bagels, doughnuts, rolls, buns, biscuits,white and wheat breads, pizza crust and traditional pasta made of white flour & egg white(vegetarian pasta or spinach or wheat pasta is OK).  Multi-grain bread is OK - like multi-grain flat bread or sandwich thins. Avoid alcohol in excess. Exercise is also important.    Eat all the vegetables you want - avoid meat, especially red meat and dairy - especially cheese.  Cheese is  the most concentrated form of trans-fats which is the worst thing to clog up our arteries. Veggie cheese is OK which can be found in the fresh produce section at [employer] or [employer] or [employer]   

## 2014-02-21 LAB — HEPATIC FUNCTION PANEL
ALBUMIN: 4.1 g/dL (ref 3.5–5.2)
ALK PHOS: 76 U/L (ref 39–117)
ALT: 20 U/L (ref 0–35)
AST: 23 U/L (ref 0–37)
BILIRUBIN INDIRECT: 0.9 mg/dL (ref 0.2–1.2)
Bilirubin, Direct: 0.2 mg/dL (ref 0.0–0.3)
Total Bilirubin: 1.1 mg/dL (ref 0.2–1.2)
Total Protein: 6.7 g/dL (ref 6.0–8.3)

## 2014-02-21 LAB — LIPID PANEL
CHOLESTEROL: 145 mg/dL (ref 0–200)
HDL: 50 mg/dL (ref >39)
LDL Cholesterol: 62 mg/dL (ref 0–99)
TRIGLYCERIDES: 165 mg/dL — AB (ref <150)
Total CHOL/HDL Ratio: 2.9 Ratio
VLDL: 33 mg/dL (ref 0–40)

## 2014-02-21 LAB — INSULIN, FASTING: INSULIN FASTING, SERUM: 16 u[IU]/mL (ref 3–28)

## 2014-02-21 LAB — MAGNESIUM: Magnesium: 1.8 mg/dL (ref 1.5–2.5)

## 2014-02-21 LAB — VITAMIN D 25 HYDROXY (VIT D DEFICIENCY, FRACTURES): Vit D, 25-Hydroxy: 71 ng/mL (ref 30–89)

## 2014-02-21 LAB — TSH: TSH: 0.374 u[IU]/mL (ref 0.350–4.500)

## 2014-02-23 ENCOUNTER — Ambulatory Visit (INDEPENDENT_AMBULATORY_CARE_PROVIDER_SITE_OTHER): Payer: Medicare Other

## 2014-02-23 DIAGNOSIS — I4891 Unspecified atrial fibrillation: Secondary | ICD-10-CM

## 2014-02-23 DIAGNOSIS — Z5181 Encounter for therapeutic drug level monitoring: Secondary | ICD-10-CM

## 2014-02-23 LAB — POCT INR: INR: 3.5

## 2014-03-02 ENCOUNTER — Ambulatory Visit (INDEPENDENT_AMBULATORY_CARE_PROVIDER_SITE_OTHER): Payer: Medicare Other | Admitting: General Surgery

## 2014-03-03 ENCOUNTER — Ambulatory Visit: Payer: Self-pay | Admitting: Internal Medicine

## 2014-03-04 ENCOUNTER — Encounter (INDEPENDENT_AMBULATORY_CARE_PROVIDER_SITE_OTHER): Payer: Self-pay | Admitting: General Surgery

## 2014-03-04 ENCOUNTER — Ambulatory Visit (INDEPENDENT_AMBULATORY_CARE_PROVIDER_SITE_OTHER): Payer: Medicare Other | Admitting: General Surgery

## 2014-03-04 VITALS — HR 78 | Resp 18 | Ht 63.0 in | Wt 179.4 lb

## 2014-03-04 DIAGNOSIS — R197 Diarrhea, unspecified: Secondary | ICD-10-CM

## 2014-03-04 DIAGNOSIS — C180 Malignant neoplasm of cecum: Secondary | ICD-10-CM

## 2014-03-04 MED ORDER — LOPERAMIDE HCL 2 MG PO CAPS: 2.0000 mg | ORAL_CAPSULE | Freq: Every day | ORAL | Status: DC | PRN

## 2014-03-04 NOTE — Patient Instructions (Addendum)
Follow up with me in 9 months.

## 2014-03-04 NOTE — Progress Notes (Signed)
HISTORY: Patient is status post laparoscopic right hemicolectomy in April of 2014. She did get her one-year colonoscopy. There were 2 tubular adenomas found with no evidence of dysplasia. She continues to have occasional diarrhea. She has gotten to where she only home and eating out. When she is close to home the diarrhea is not as bothersome. She has not had any other change in her bowel habits and has had no blood in her stools. Of note since I saw her last, she has had her pacemaker changed out. She is having better exercise tolerance with this.   PERTINENT REVIEW OF SYSTEMS: Otherwise negative x 11  Filed Vitals:   03/04/14 1044  Pulse: 78  Resp: 18   Filed Weights   03/04/14 1044  Weight: 179 lb 6.4 oz (81.375 kg)     EXAM: Head: Normocephalic and atraumatic.  Eyes:  Conjunctivae are normal. Pupils are equal, round, and reactive to light. No scleral icterus.  Neck:  Normal range of motion. Neck supple. No tracheal deviation present. No thyromegaly present.  Resp: No respiratory distress, normal effort. Abd:  Abdomen is soft, non distended and non tender. No masses are palpable.  There is no rebound and no guarding.   No evidence of hernia at incision. Rectus diastasis is present. Neurological: Alert and oriented to person, place, and time. Coordination normal.  Skin: Skin is warm and dry. No rash noted. No diaphoretic. No erythema. No pallor.  Psychiatric: Normal mood and affect. Normal behavior. Judgment and thought content normal.      ASSESSMENT AND PLAN:   Cecal cancer, s/p lap r colectomy 12/2012 Pt has had 1 year colonoscopy with 2 adenomas.  Will need another colonoscopy in 2 years.    Continue current meds.  No clinical evidence of disease.         Milus Height, MD Surgical Oncology, [facility]  MCKEOWN,WILLIAM DAVID, MD No ref. provider found

## 2014-03-04 NOTE — Assessment & Plan Note (Signed)
Pt has had 1 year colonoscopy with 2 adenomas.  Will need another colonoscopy in 2 years.    Continue current meds.  No clinical evidence of disease.

## 2014-03-16 ENCOUNTER — Other Ambulatory Visit: Payer: Self-pay | Admitting: Cardiology

## 2014-03-16 ENCOUNTER — Other Ambulatory Visit: Payer: Self-pay | Admitting: Internal Medicine

## 2014-03-17 ENCOUNTER — Ambulatory Visit (INDEPENDENT_AMBULATORY_CARE_PROVIDER_SITE_OTHER): Payer: Medicare Other | Admitting: *Deleted

## 2014-03-17 ENCOUNTER — Other Ambulatory Visit: Payer: Self-pay

## 2014-03-17 DIAGNOSIS — Z5181 Encounter for therapeutic drug level monitoring: Secondary | ICD-10-CM

## 2014-03-17 DIAGNOSIS — I4891 Unspecified atrial fibrillation: Secondary | ICD-10-CM

## 2014-03-17 LAB — POCT INR: INR: 2.3

## 2014-03-17 MED ORDER — FUROSEMIDE 40 MG PO TABS: 20.0000 mg | ORAL_TABLET | Freq: Every day | ORAL | Status: DC

## 2014-04-15 ENCOUNTER — Ambulatory Visit (INDEPENDENT_AMBULATORY_CARE_PROVIDER_SITE_OTHER): Payer: Medicare Other | Admitting: Pharmacist

## 2014-04-15 DIAGNOSIS — I4891 Unspecified atrial fibrillation: Secondary | ICD-10-CM

## 2014-04-15 DIAGNOSIS — Z5181 Encounter for therapeutic drug level monitoring: Secondary | ICD-10-CM

## 2014-04-15 LAB — POCT INR: INR: 2.4

## 2014-05-19 ENCOUNTER — Ambulatory Visit (INDEPENDENT_AMBULATORY_CARE_PROVIDER_SITE_OTHER): Payer: Medicare Other | Admitting: *Deleted

## 2014-05-19 DIAGNOSIS — Z5181 Encounter for therapeutic drug level monitoring: Secondary | ICD-10-CM

## 2014-05-19 DIAGNOSIS — I4891 Unspecified atrial fibrillation: Secondary | ICD-10-CM

## 2014-05-19 LAB — POCT INR: INR: 2.2

## 2014-05-20 ENCOUNTER — Encounter: Payer: Self-pay | Admitting: Internal Medicine

## 2014-05-20 ENCOUNTER — Ambulatory Visit (INDEPENDENT_AMBULATORY_CARE_PROVIDER_SITE_OTHER): Payer: Medicare Other | Admitting: *Deleted

## 2014-05-20 ENCOUNTER — Telehealth: Payer: Self-pay | Admitting: Cardiology

## 2014-05-20 DIAGNOSIS — I519 Heart disease, unspecified: Secondary | ICD-10-CM

## 2014-05-20 DIAGNOSIS — I442 Atrioventricular block, complete: Secondary | ICD-10-CM

## 2014-05-20 NOTE — Telephone Encounter (Signed)
Spoke with pt and reminded pt of remote transmission that is due today. Pt verbalized understanding.   

## 2014-05-20 NOTE — Progress Notes (Signed)
Remote pacemaker transmission.   

## 2014-05-26 ENCOUNTER — Ambulatory Visit: Payer: Self-pay | Admitting: Emergency Medicine

## 2014-05-26 ENCOUNTER — Encounter: Payer: Self-pay | Admitting: Internal Medicine

## 2014-05-26 ENCOUNTER — Ambulatory Visit (INDEPENDENT_AMBULATORY_CARE_PROVIDER_SITE_OTHER): Payer: Medicare Other | Admitting: Internal Medicine

## 2014-05-26 ENCOUNTER — Ambulatory Visit: Payer: Self-pay | Admitting: Internal Medicine

## 2014-05-26 VITALS — BP 126/68 | HR 72 | Temp 97.5°F | Resp 16 | Ht 62.0 in | Wt 178.4 lb

## 2014-05-26 DIAGNOSIS — Z951 Presence of aortocoronary bypass graft: Secondary | ICD-10-CM

## 2014-05-26 DIAGNOSIS — E782 Mixed hyperlipidemia: Secondary | ICD-10-CM

## 2014-05-26 DIAGNOSIS — E559 Vitamin D deficiency, unspecified: Secondary | ICD-10-CM

## 2014-05-26 DIAGNOSIS — E119 Type 2 diabetes mellitus without complications: Secondary | ICD-10-CM

## 2014-05-26 DIAGNOSIS — Z79899 Other long term (current) drug therapy: Secondary | ICD-10-CM

## 2014-05-26 DIAGNOSIS — I10 Essential (primary) hypertension: Secondary | ICD-10-CM

## 2014-05-26 LAB — CBC WITH DIFFERENTIAL/PLATELET
Basophils Absolute: 0.1 10*3/uL (ref 0.0–0.1)
Basophils Relative: 1 % (ref 0–1)
Eosinophils Absolute: 0.1 10*3/uL (ref 0.0–0.7)
Eosinophils Relative: 1 % (ref 0–5)
HCT: 43.1 % (ref 36.0–46.0)
HEMOGLOBIN: 14.6 g/dL (ref 12.0–15.0)
LYMPHS ABS: 1.9 10*3/uL (ref 0.7–4.0)
LYMPHS PCT: 29 % (ref 12–46)
MCH: 29.3 pg (ref 26.0–34.0)
MCHC: 33.9 g/dL (ref 30.0–36.0)
MCV: 86.4 fL (ref 78.0–100.0)
MONOS PCT: 11 % (ref 3–12)
Monocytes Absolute: 0.7 10*3/uL (ref 0.1–1.0)
NEUTROS ABS: 3.7 10*3/uL (ref 1.7–7.7)
NEUTROS PCT: 58 % (ref 43–77)
Platelets: 244 10*3/uL (ref 150–400)
RBC: 4.99 MIL/uL (ref 3.87–5.11)
RDW: 14.5 % (ref 11.5–15.5)
WBC: 6.4 10*3/uL (ref 4.0–10.5)

## 2014-05-26 NOTE — Patient Instructions (Signed)

## 2014-05-26 NOTE — Progress Notes (Signed)
Patient ID: Anita Duke, female   DOB: October 19, 1940, 73 y.o.   MRN: [id number]   This very nice 73 y.o.MWF presents for 3 month follow up with Hypertension, Hyperlipidemia, Pre-Diabetes and Vitamin D Deficiency. In Mar 2014 she had a colon cancer resected and in Mar this year she reports colonoscopy found 2 small polyps with atypia.    Patient is treated for HTN (1968)  & BP has been controlled at home. Today's BP is 126/68 mmHg. Patient had a CABG & MVR in 2001 and is on coumadin for chAfib by [facility]. Patient denies any cardiac type chest pain, palpitations, dyspnea/orthopnea/PND, dizziness, claudication, or dependent edema.   Hyperlipidemia is controlled with diet & meds. Patient denies myalgias or other med SE's. Last Lipids were  Total Chol 145; HDL  50; LDL  62; Trig 165 on 02/20/2014.   Also, the patient has history of PreDiabetes and patient denies any symptoms of reactive hypoglycemia, diabetic polys, paresthesias or visual blurring.  Last A1c was  7.0% on 02/20/2014.    Further, Patient has history of Vitamin D Deficiency and patient supplements vitamin D without any suspected side-effects. Last vitamin D was  71 on 02/20/2014.   Medication List   acetaminophen 500 MG tablet  Commonly known as:  TYLENOL  Take 1,000 mg by mouth every 6 (six) hours as needed. For pain     atorvastatin 40 MG tablet  Commonly known as:  LIPITOR  Take 1 tablet (40 mg total) by mouth daily.     budesonide-formoterol 160-4.5 MCG/ACT inhaler  Commonly known as:  SYMBICORT  Inhale 2 puffs into the lungs as needed.     calcium carbonate 600 MG Tabs tablet  Commonly known as:  OS-CAL  Take 600 mg by mouth daily.     carvedilol 6.25 MG tablet  Commonly known as:  COREG  Take 1 tablet (6.25 mg total) by mouth 2 (two) times daily with a meal.     cholecalciferol 1000 UNITS tablet  Commonly known as:  VITAMIN D  Take 4,000 Units by mouth daily.     ECOTRIN LOW STRENGTH 81 MG EC tablet  Generic  drug:  aspirin  Take 81 mg by mouth daily. Swallow whole.     fish oil-omega-3 fatty acids 1000 MG capsule  Take 1 g by mouth 2 (two) times daily.     furosemide 40 MG tablet  Commonly known as:  LASIX  Take 0.5 tablets (20 mg total) by mouth daily.     loperamide 2 MG capsule  Commonly known as:  IMODIUM  Take 1 capsule (2 mg total) by mouth daily as needed for diarrhea or loose stools.     loratadine 10 MG tablet  Commonly known as:  CLARITIN  Take 10 mg by mouth daily.     metFORMIN 500 MG tablet  Commonly known as:  GLUCOPHAGE  Take 2 tablets by mouth  twice a day with meals     potassium chloride SA 20 MEQ tablet  Commonly known as:  KLOR-CON M20  Take 3 tablets (60 mEq total) by mouth daily.     trandolapril 4 MG tablet  Commonly known as:  MAVIK  Take 1 tablet by mouth two  times daily for blood  pressure     venlafaxine XR 37.5 MG 24 hr capsule  Commonly known as:  EFFEXOR-XR  Take 1 capsule by mouth  every evening     warfarin 2.5 MG tablet  Commonly known as:  COUMADIN  Take as directed by   anticoagulation clinic     Allergies  Allergen Reactions  . Demerol [Meperidine] Nausea And Vomiting  . Morphine Other (See Comments)    vomiting  . Tetanus Toxoid Rash    "years ago"   PMHx:   Past Medical History  Diagnosis Date  . Mitral regurgitation     Treated with repair, atrial appendage was removed or tied off at surgery.. this was proven by TEE February, 2011  . Pulmonary HTN     55 mmHg, echo, February, 2011 / no mention of pulmonary hypertension echo, June, 2011  . Right ventricular dysfunction     Mild to moderate, echo, February, 2011 / normalized echo, June, 2011  . TR (tricuspid regurgitation)     Moderate, echo, February, 2011 / trivial, echo, June, 2011  . Mitral stenosis     Mild, February, 2011, post mitral valve repair / mild, echo, June, 2011  . CAD (coronary artery disease)     LIMA to the LAD at time of mitral valve repair / LIMA  atretic,, February, 2011  . Ejection fraction < 50%     EF 30% echo and TEE diagnosis February, 2011, possibly rate related / no contraction by catheter, bradycardia so carvedilol cannot be used / EF 55-60% echo, June, 2011 / EF 50%, echo, February, 2012  . Hemorrhoids, internal   . Diverticulosis of colon   . Colon polyp, hyperplastic   . H/O amiodarone therapy     Satrted 02/12/2012  . High cholesterol   . Asthma   . COPD (chronic obstructive pulmonary disease)   . HTN (hypertension)     takes Mavik daily  . Myocardial infarction 04/2012  ?  . CHF (congestive heart failure) 2011    Mild in hospital February, 2011;takes Lasix daily  . Seasonal allergies     takes Claritin daily  . Type II diabetes mellitus     takes Metformin daily  . Cataracts, bilateral     immature  . Depression     takes Effexor daily  . Atrial fibrillation     Treated with maze procedure / recurrent atrial fibrillation February, 2011... TEE cardioversion October 19, 2009; s/p AVN ablatin  . QT prolongation     Tikosyn and Effexor. QT prolonged October 13, 2011, peak is in dose reduced from 500  to -250 twice a day  . Anemia     Hemoglobin 10.4, December, 2013  . GERD (gastroesophageal reflux disease)   . Arthritis     "not bad; little in my hands; some in my knees" (11/04/2013)  . Cecal cancer 2014   FHx:    Reviewed / unchanged SHx:    Reviewed / unchanged  Systems Review:  Constitutional: Denies fever, chills, wt changes, headaches, insomnia, fatigue, night sweats, change in appetite. Eyes: Denies redness, blurred vision, diplopia, discharge, itchy, watery eyes.  ENT: Denies discharge, congestion, post nasal drip, epistaxis, sore throat, earache, hearing loss, dental pain, tinnitus, vertigo, sinus pain, snoring.  CV: Denies chest pain, palpitations, irregular heartbeat, syncope, dyspnea, diaphoresis, orthopnea, PND, claudication or edema. Respiratory: denies cough, dyspnea, DOE, pleurisy, hoarseness,  laryngitis, wheezing.  Gastrointestinal: Denies dysphagia, odynophagia, heartburn, reflux, water brash, abdominal pain or cramps, nausea, vomiting, bloating, diarrhea, constipation, hematemesis, melena, hematochezia  or hemorrhoids. Genitourinary: Denies dysuria, frequency, urgency, nocturia, hesitancy, discharge, hematuria or flank pain. Musculoskeletal: Denies arthralgias, myalgias, stiffness, jt. swelling, pain, limping or strain/sprain.  Skin: Denies pruritus, rash, hives, warts, acne, eczema or  change in skin lesion(s). Neuro: No weakness, tremor, incoordination, spasms, paresthesia or pain. Psychiatric: Denies confusion, memory loss or sensory loss. Endo: Denies change in weight, skin or hair change.  Heme/Lymph: No excessive bleeding, bruising or enlarged lymph nodes.  Exam:  BP 126/68  Pulse 72  Temp(Src) 97.5 F (36.4 C) (Temporal)  Resp 16  Ht 5\' 2"  (1.575 m)  Wt 178 lb 6.4 oz (80.922 kg)  BMI 32.62 kg/m2  Appears well nourished and in no distress. Eyes: PERRLA, EOMs, conjunctiva no swelling or erythema. Sinuses: No frontal/maxillary tenderness ENT/Mouth: EAC's clear, TM's nl w/o erythema, bulging. Nares clear w/o erythema, swelling, exudates. Oropharynx clear without erythema or exudates. Oral hygiene is good. Tongue normal, non obstructing. Hearing intact.  Neck: Supple. Thyroid nl. Car 2+/2+ without bruits, nodes or JVD. Chest: Respirations nl with BS clear & equal w/o rales, rhonchi, wheezing or stridor.  Cor: Heart sounds normal w/ regular rate and rhythm without sig. murmurs, gallops, clicks, or rubs. Peripheral pulses normal and equal  without edema.  Abdomen: Soft & bowel sounds normal. Non-tender w/o guarding, rebound, hernias, masses, or organomegaly.  Lymphatics: Unremarkable.  Musculoskeletal: Full ROM all peripheral extremities, joint stability, 5/5 strength, and normal gait.  Skin: Warm, dry without exposed rashes, lesions or ecchymosis apparent.  Neuro:  Cranial nerves intact, reflexes equal bilaterally. Sensory-motor testing grossly intact. Tendon reflexes grossly intact.  Pysch: Alert & oriented x 3.  Insight and judgement nl & appropriate. No ideations.  Assessment and Plan:  1. Hypertension - Continue monitor blood pressure at home. Continue diet/meds same.  2. Hyperlipidemia - Continue diet/meds, exercise,& lifestyle modifications. Continue monitor periodic cholesterol/liver & renal functions   3. T2_NIDDM - Continue diet, exercise, lifestyle modifications. Monitor appropriate labs.  4. Vitamin D Deficiency - Continue supplementation.  5. Afibrillation - on coumadin thru [place]  6. ASCAD s/p CABG & MVR (2001)  7. Perm Cardiac Pacer (2013) for SSS  Recommended regular exercise, BP monitoring, weight control, and discussed med and SE's. Recommended labs to assess and monitor clinical status. Further disposition pending results of labs.

## 2014-05-27 LAB — TSH: TSH: 0.518 u[IU]/mL (ref 0.350–4.500)

## 2014-05-27 LAB — HEMOGLOBIN A1C
HEMOGLOBIN A1C: 7 % — AB (ref <5.7)
MEAN PLASMA GLUCOSE: 154 mg/dL — AB (ref <117)

## 2014-05-27 LAB — HEPATIC FUNCTION PANEL
ALK PHOS: 88 U/L (ref 39–117)
ALT: 23 U/L (ref 0–35)
AST: 25 U/L (ref 0–37)
Albumin: 4.5 g/dL (ref 3.5–5.2)
Bilirubin, Direct: 0.2 mg/dL (ref 0.0–0.3)
Indirect Bilirubin: 1.1 mg/dL (ref 0.2–1.2)
TOTAL PROTEIN: 6.9 g/dL (ref 6.0–8.3)
Total Bilirubin: 1.3 mg/dL — ABNORMAL HIGH (ref 0.2–1.2)

## 2014-05-27 LAB — BASIC METABOLIC PANEL WITH GFR
BUN: 12 mg/dL (ref 6–23)
CO2: 30 mEq/L (ref 19–32)
Calcium: 9.6 mg/dL (ref 8.4–10.5)
Chloride: 101 mEq/L (ref 96–112)
Creat: 0.56 mg/dL (ref 0.50–1.10)
GFR, Est Non African American: >89 mL/min
GLUCOSE: 98 mg/dL (ref 70–99)
Potassium: 4.1 mEq/L (ref 3.5–5.3)
Sodium: 140 mEq/L (ref 135–145)

## 2014-05-27 LAB — LIPID PANEL
CHOLESTEROL: 134 mg/dL (ref 0–200)
HDL: 50 mg/dL (ref >39)
LDL CALC: 41 mg/dL (ref 0–99)
Total CHOL/HDL Ratio: 2.7 Ratio
Triglycerides: 215 mg/dL — ABNORMAL HIGH (ref <150)
VLDL: 43 mg/dL — AB (ref 0–40)

## 2014-05-27 LAB — VITAMIN D 25 HYDROXY (VIT D DEFICIENCY, FRACTURES): Vit D, 25-Hydroxy: 79 ng/mL (ref 30–89)

## 2014-05-27 LAB — INSULIN, FASTING: Insulin fasting, serum: 8.7 u[IU]/mL (ref 2.0–19.6)

## 2014-05-27 LAB — MAGNESIUM: Magnesium: 1.8 mg/dL (ref 1.5–2.5)

## 2014-05-28 ENCOUNTER — Encounter: Payer: Self-pay | Admitting: Cardiology

## 2014-05-28 ENCOUNTER — Ambulatory Visit (INDEPENDENT_AMBULATORY_CARE_PROVIDER_SITE_OTHER): Payer: Medicare Other | Admitting: Cardiology

## 2014-05-28 ENCOUNTER — Ambulatory Visit (HOSPITAL_COMMUNITY): Payer: Medicare Other | Attending: Internal Medicine

## 2014-05-28 ENCOUNTER — Telehealth: Payer: Self-pay | Admitting: Cardiology

## 2014-05-28 ENCOUNTER — Other Ambulatory Visit (HOSPITAL_COMMUNITY): Payer: Medicare Other

## 2014-05-28 VITALS — BP 138/70 | HR 72 | Ht 62.0 in | Wt 179.4 lb

## 2014-05-28 DIAGNOSIS — I10 Essential (primary) hypertension: Secondary | ICD-10-CM | POA: Diagnosis not present

## 2014-05-28 DIAGNOSIS — R197 Diarrhea, unspecified: Secondary | ICD-10-CM | POA: Insufficient documentation

## 2014-05-28 DIAGNOSIS — E785 Hyperlipidemia, unspecified: Secondary | ICD-10-CM | POA: Insufficient documentation

## 2014-05-28 DIAGNOSIS — Z9889 Other specified postprocedural states: Secondary | ICD-10-CM

## 2014-05-28 DIAGNOSIS — E782 Mixed hyperlipidemia: Secondary | ICD-10-CM

## 2014-05-28 DIAGNOSIS — Z950 Presence of cardiac pacemaker: Secondary | ICD-10-CM

## 2014-05-28 DIAGNOSIS — J4520 Mild intermittent asthma, uncomplicated: Secondary | ICD-10-CM

## 2014-05-28 DIAGNOSIS — I495 Sick sinus syndrome: Secondary | ICD-10-CM | POA: Diagnosis not present

## 2014-05-28 DIAGNOSIS — I4891 Unspecified atrial fibrillation: Secondary | ICD-10-CM | POA: Insufficient documentation

## 2014-05-28 DIAGNOSIS — R0602 Shortness of breath: Secondary | ICD-10-CM

## 2014-05-28 DIAGNOSIS — Z7901 Long term (current) use of anticoagulants: Secondary | ICD-10-CM

## 2014-05-28 DIAGNOSIS — I442 Atrioventricular block, complete: Secondary | ICD-10-CM | POA: Diagnosis not present

## 2014-05-28 DIAGNOSIS — I050 Rheumatic mitral stenosis: Secondary | ICD-10-CM

## 2014-05-28 DIAGNOSIS — I25810 Atherosclerosis of coronary artery bypass graft(s) without angina pectoris: Secondary | ICD-10-CM

## 2014-05-28 DIAGNOSIS — C180 Malignant neoplasm of cecum: Secondary | ICD-10-CM

## 2014-05-28 DIAGNOSIS — I519 Heart disease, unspecified: Secondary | ICD-10-CM

## 2014-05-28 DIAGNOSIS — I482 Chronic atrial fibrillation, unspecified: Secondary | ICD-10-CM

## 2014-05-28 DIAGNOSIS — J45909 Unspecified asthma, uncomplicated: Secondary | ICD-10-CM

## 2014-05-28 DIAGNOSIS — I2510 Atherosclerotic heart disease of native coronary artery without angina pectoris: Secondary | ICD-10-CM | POA: Insufficient documentation

## 2014-05-28 NOTE — Progress Notes (Signed)
HPI  patient returns today for followup of her complex cardiac disease. She is doing very well. She underwent CRT-P upgrade by Dr. Rayann Heman. She is definitely significantly improved. I am very pleased for her. Also, her husband with significant neck cancer he is doing much better. She is fully active. She has some shortness of breath if she pushes too hard up a hill. However she is greatly improved from the past. She is very appreciative of all of the help that she is received.  She had a 2-D echo today that is not fully reviewed. I did see it briefly. Her mitral valve inflow is adequate. I wanted to be sure that she is not developing worsening mitral stenosis after her mitral valve repair. This is not the case.  Allergies  Allergen Reactions  . Demerol [Meperidine] Nausea And Vomiting  . Morphine Other (See Comments)    vomiting  . Tetanus Toxoid Rash    "years ago"    Current Outpatient Prescriptions  Medication Sig Dispense Refill  . acetaminophen (TYLENOL) 500 MG tablet Take 1,000 mg by mouth every 6 (six) hours as needed. For pain      . aspirin (ECOTRIN LOW STRENGTH) 81 MG EC tablet Take 81 mg by mouth daily. Swallow whole.      Marland Kitchen atorvastatin (LIPITOR) 40 MG tablet Take 1 tablet (40 mg total) by mouth daily.  90 tablet  3  . budesonide-formoterol (SYMBICORT) 160-4.5 MCG/ACT inhaler Inhale 2 puffs into the lungs as needed.       . calcium carbonate (OS-CAL) 600 MG TABS Take 600 mg by mouth daily.       . carvedilol (COREG) 6.25 MG tablet Take 1 tablet (6.25 mg total) by mouth 2 (two) times daily with a meal.  180 tablet  3  . cholecalciferol (VITAMIN D) 1000 UNITS tablet Take 4,000 Units by mouth daily.      . fish oil-omega-3 fatty acids 1000 MG capsule Take 1 g by mouth 2 (two) times daily.       . furosemide (LASIX) 40 MG tablet Take 0.5 tablets (20 mg total) by mouth daily.  90 tablet  3  . loperamide (IMODIUM) 2 MG capsule Take 1 capsule (2 mg total) by mouth daily as needed  for diarrhea or loose stools.  30 capsule  10  . loratadine (CLARITIN) 10 MG tablet Take 10 mg by mouth daily.      . metFORMIN (GLUCOPHAGE) 500 MG tablet Take 2 tablets by mouth  twice a day with meals  360 tablet  1  . potassium chloride SA (KLOR-CON M20) 20 MEQ tablet Take 3 tablets (60 mEq total) by mouth daily.  90 tablet  3  . trandolapril (MAVIK) 4 MG tablet Take 1 tablet by mouth two  times daily for blood  pressure  180 tablet  3  . venlafaxine XR (EFFEXOR-XR) 37.5 MG 24 hr capsule Take 1 capsule by mouth  every evening  90 capsule  3  . warfarin (COUMADIN) 2.5 MG tablet Take as directed by   anticoagulation clinic  100 tablet  1   No current facility-administered medications for this visit.    History   Social History  . Marital Status: Married    Spouse Name: N/A    Number of Children: 0  . 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    [employer]     Social History  Main Topics  . Smoking status: Never Smoker   . Smokeless tobacco: Never Used  . Alcohol Use: No  . Drug Use: No  . Sexual Activity: No   Other Topics Concern  . Not on file   Social History Narrative   Does not et regular exercise   Daily caffeine use          Family History  Problem Relation Age of Onset  . Pancreatic cancer Maternal Grandfather   . Heart disease Father   . Diabetes Father   . Heart disease Sister     x 2  . Diabetes Mother   . Kidney disease Mother   . Colon cancer Neg Hx   . Diabetes Paternal Grandfather   . Diabetes Paternal Grandmother   . Heart disease Paternal Uncle     x 6  . Prostate cancer Paternal Uncle   . Colon polyps Neg Hx     Past Medical History  Diagnosis Date  . Mitral regurgitation     Treated with repair, atrial appendage was removed or tied off at surgery.. this was proven by TEE February, 2011  . Pulmonary HTN     55 mmHg, echo, February, 2011 / no mention of pulmonary hypertension echo, June, 2011  . Right  ventricular dysfunction     Mild to moderate, echo, February, 2011 / normalized echo, June, 2011  . TR (tricuspid regurgitation)     Moderate, echo, February, 2011 / trivial, echo, June, 2011  . Mitral stenosis     Mild, February, 2011, post mitral valve repair / mild, echo, June, 2011  . CAD (coronary artery disease)     LIMA to the LAD at time of mitral valve repair / LIMA atretic,, February, 2011  . Ejection fraction < 50%     EF 30% echo and TEE diagnosis February, 2011, possibly rate related / no contraction by catheter, bradycardia so carvedilol cannot be used / EF 55-60% echo, June, 2011 / EF 50%, echo, February, 2012  . Hemorrhoids, internal   . Diverticulosis of colon   . Colon polyp, hyperplastic   . H/O amiodarone therapy     Satrted 02/12/2012  . High cholesterol   . Asthma   . COPD (chronic obstructive pulmonary disease)   . HTN (hypertension)     takes Mavik daily  . Myocardial infarction 04/2012  ?  . CHF (congestive heart failure) 2011    Mild in hospital February, 2011;takes Lasix daily  . Seasonal allergies     takes Claritin daily  . Type II diabetes mellitus     takes Metformin daily  . Cataracts, bilateral     immature  . Depression     takes Effexor daily  . Atrial fibrillation     Treated with maze procedure / recurrent atrial fibrillation February, 2011... TEE cardioversion October 19, 2009; s/p AVN ablatin  . QT prolongation     Tikosyn and Effexor. QT prolonged October 13, 2011, peak is in dose reduced from 500  to -250 twice a day  . Anemia     Hemoglobin 10.4, December, 2013  . GERD (gastroesophageal reflux disease)   . Arthritis     "not bad; little in my hands; some in my knees" (11/04/2013)  . Cecal cancer 2014    Past Surgical History  Procedure Laterality Date  . Mitral valve repair  2001    "anterior and posterior leaflets" (12/04/2012)  . Maze  2001    w/ MVR &  CABG  . Cardioversion  02/08/2012    Procedure: CARDIOVERSION;  Surgeon:  Carlena Bjornstad, MD;  Location: [place];  Service: Cardiovascular;  Laterality: N/A;  . Cardioversion  02/29/2012    Procedure: CARDIOVERSION;  Surgeon: Carlena Bjornstad, MD;  Location: [place];  Service: Cardiovascular;  Laterality: N/A;  . Cardioversion  05/15/2012    Procedure: CARDIOVERSION;  Surgeon: Carlena Bjornstad, MD;  Location: [facility] ENDOSCOPY;  Service: Cardiovascular;  Laterality: N/A;  . Tonsillectomy and adenoidectomy  ~ 1950  . 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 Av node ablation  05/28/12  . Cardiac catheterization  05/2012  . Laparoscopic right hemi colectomy  12/03/2012    Procedure: LAPAROSCOPIC RIGHT HEMI COLECTOMY;  Surgeon: Stark Klein, MD;  Location: [place];  Service: General;;  . Umbilical hernia repair N/A 12/03/2012    Procedure: HERNIA REPAIR UMBILICAL ADULT;  Surgeon: Stark Klein, MD;  Location: [place];  Service: General;  Laterality: N/A;  . Diagnostic laparoscopic liver biopsy Left 12/03/2012    Procedure: DIAGNOSTIC LAPAROSCOPIC LIVER BIOPSY;  Surgeon: Stark Klein, MD;  Location: [place];  Service: General;  Laterality: Left;  . Coronary artery bypass graft  2001    CABG X1 "at time of mitral valve repair" (12/04/2012  . Breast lumpectomy Bilateral 1990's  . Cholecystectomy  1995  . Abdominal hysterectomy  ?2002  . Bi-ventricular pacemaker upgrade  11/04/2013    upgrade of previously implanted dual chamber pacemaker to STJ Quadra Allure CRTP by Dr Allred    Patient Active Problem List   Diagnosis Date Noted  . Diarrhea 03/04/2014  . Vitamin D Deficiency 02/20/2014  . Encounter for long-term (current) use of other medications 02/20/2014  . Long term (current) use of anticoagulants 11/24/2013  . Chronic systolic dysfunction of left ventricle 05/21/2013  . 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 Special screening for malignant neoplasms, colon 10/31/2012  . Anemia   . Acquired complete AV block   . Sick  sinus syndrome   . Pacemaker-Medtronic 05/20/2012  . Allergic rhinitis 04/25/2012  . Mild intermittent asthma 02/16/2011  . Ejection fraction < 50%   . Atrial fibrillation   . Mitral regurgitation   . Right ventricular dysfunction   . Mitral stenosis   . Warfarin anticoagulation   . S/P mitral valve repair   . Hx of CABG   . T2_NIDDM 03/02/2009  . Hyperlipidemia 07/07/2008  . Hypertension 07/07/2008  . DIVERTICULOSIS, COLON 08/04/2002    ROS   patient denies fever, chills, headache, sweats, rash, change in vision, change in hearing, chest pain, cough, nausea or vomiting, urinary symptoms. All other systems are reviewed and are negative.  PHYSICAL EXAM  the patient looks and feels great. She is overweight. Head is atraumatic. Sclera and conjunctiva are normal. There is no jugulovenous distention. Lungs are clear. Respiratory effort is nonlabored. Cardiac exam reveals S1 and S2. The rhythm is regular. ( history of AV node ablation with pacing). Abdomen is soft. There is no peripheral edema. There are no musculoskeletal deformities. There are no skin rashes.  Filed Vitals:   05/28/14 1403  BP: 138/70  Pulse: 72  Height: 5\' 2"  (1.575 m)  Weight: 179 lb 6.4 oz (81.375 kg)  SpO2: 95%     ASSESSMENT & PLAN

## 2014-05-28 NOTE — Assessment & Plan Note (Signed)
We have worked for a long time to find a way to help her shortness of breath. She has complex disease. However I do feel that her current status is the most stable that she has been in a long time. Her left ventricular function has not returned to normal. After ICD echo data with further review, I will see if we want to make any further medication adjustments.

## 2014-05-28 NOTE — Assessment & Plan Note (Signed)
She is on guidelines directed therapy for her lipids and coronary disease.

## 2014-05-28 NOTE — Assessment & Plan Note (Signed)
The reassessment of her ejection fraction is still pending from today. Clinically she is stable.

## 2014-05-28 NOTE — Assessment & Plan Note (Signed)
She is on Coumadin. In the past we use Coumadin with aspirin in patients with coronary disease. However she has only mild coronary disease that has been stable for many years. Therefore her aspirin is to be stopped. Coumadin will be to continue long-term.

## 2014-05-28 NOTE — Assessment & Plan Note (Signed)
It is of note that she's had no significant symptoms since her CRT upgrade

## 2014-05-28 NOTE — Progress Notes (Signed)
2D Echo completed. 05/28/2014 

## 2014-05-28 NOTE — Assessment & Plan Note (Signed)
She now has a biventricular pacemaker as of March, 2015.

## 2014-05-28 NOTE — Assessment & Plan Note (Signed)
It appears that her mitral stenosis remains mild by echo today also. He was mild in the cath lab in August, 2014

## 2014-05-28 NOTE — Patient Instructions (Signed)
**Note De-identified Jolee Critcher Obfuscation** Your physician recommends that you continue on your current medications as directed. Please refer to the Current Medication list given to you today.  Your physician wants you to follow-up in: 6 months. You will receive a reminder letter in the mail two months in advance. If you don't receive a letter, please call our office to schedule the follow-up appointment.  

## 2014-05-28 NOTE — Telephone Encounter (Signed)
°  Patient has questions about her appointment today. Something was mentioned to her regarding her TSH, and she don't understand. Please call and advice.

## 2014-05-28 NOTE — Assessment & Plan Note (Signed)
Patient continues to do well after her mitral valve was repaired at the [facility] in the past.  As part of today's evaluation I spent greater than 25 minutes with her total care. More than half of this time is been with direct contact with the patient.

## 2014-05-28 NOTE — Telephone Encounter (Signed)
The pt could not remember what Dr Ron Parker advised her to do concerning her TSH level that was ordered by her PCP. She is advised, per Dr Ron Parker, to contact her PCP for recommendation. She verbalized understanding.

## 2014-05-28 NOTE — Assessment & Plan Note (Signed)
There is chronic atrial fibrillation. The patient did have her atrial appendage tied off at the time of her mitral surgery. She does continue on Coumadin.

## 2014-05-28 NOTE — Assessment & Plan Note (Signed)
Fortunately she is doing very well after surgery. No chemotherapy is needed.

## 2014-05-28 NOTE — Assessment & Plan Note (Signed)
Originally the patient had AV nodal ablation. She received a pacemaker at that time that was later upgraded to a CRT-P

## 2014-05-29 ENCOUNTER — Telehealth: Payer: Self-pay | Admitting: *Deleted

## 2014-05-29 NOTE — Telephone Encounter (Signed)
Patient saw Dr Ron Parker and was concerned about her TSH and told her to call Dr Melford Aase.  Per Dr Melford Aase, her TSH was in good range and we will continue to monitor.

## 2014-06-08 ENCOUNTER — Ambulatory Visit (INDEPENDENT_AMBULATORY_CARE_PROVIDER_SITE_OTHER): Payer: Medicare Other | Admitting: Physician Assistant

## 2014-06-08 ENCOUNTER — Encounter: Payer: Self-pay | Admitting: Physician Assistant

## 2014-06-08 VITALS — BP 138/72 | HR 88 | Temp 97.9°F | Resp 16 | Ht 62.0 in | Wt 180.0 lb

## 2014-06-08 DIAGNOSIS — J209 Acute bronchitis, unspecified: Secondary | ICD-10-CM

## 2014-06-08 MED ORDER — AZITHROMYCIN 250 MG PO TABS: ORAL_TABLET | ORAL | Status: AC

## 2014-06-08 MED ORDER — PREDNISONE 20 MG PO TABS: ORAL_TABLET | ORAL | Status: DC

## 2014-06-08 MED ORDER — HYDROCODONE-ACETAMINOPHEN 5-325 MG PO TABS: ORAL_TABLET | ORAL | Status: DC

## 2014-06-08 NOTE — Progress Notes (Signed)
   Subjective:    Patient ID: Anita Duke, female    DOB: 1941/08/20, 73 y.o.   MRN: [id number]  Cough This is a new problem. Episode onset: 5 days. The problem has been gradually worsening. The cough is non-productive. Associated symptoms include nasal congestion, postnasal drip, rhinorrhea, a sore throat, shortness of breath and wheezing. Pertinent negatives include no chest pain, chills, ear congestion, ear pain, fever, headaches, heartburn, hemoptysis, myalgias, rash, sweats or weight loss. The symptoms are aggravated by lying down. Treatments tried: claritin, cough drops.   Wt Readings from Last 3 Encounters:  06/08/14 180 lb (81.647 kg)  05/28/14 179 lb 6.4 oz (81.375 kg)  05/26/14 178 lb 6.4 oz (80.922 kg)     Review of Systems  Constitutional: Positive for fatigue. Negative for fever, chills, weight loss, activity change and appetite change.  HENT: Positive for congestion, postnasal drip, rhinorrhea, sinus pressure and sore throat. Negative for ear pain, tinnitus, trouble swallowing and voice change.   Eyes: Negative.   Respiratory: Positive for cough, shortness of breath and wheezing. Negative for hemoptysis and chest tightness.        Sleeping in recliner due to drainage, denies PND/orthopnea  Cardiovascular: Negative for chest pain, palpitations and leg swelling.  Gastrointestinal: Negative.  Negative for heartburn.  Genitourinary: Negative.   Musculoskeletal: Negative.  Negative for myalgias.  Skin: Negative for rash.  Neurological: Negative.  Negative for headaches.       Objective:   Physical Exam  Constitutional: She is oriented to person, place, and time. She appears well-developed and well-nourished.  HENT:  Head: Normocephalic and atraumatic.  Right Ear: External ear normal.  Left Ear: External ear normal.  Nose: Nose normal.  Mouth/Throat: Oropharynx is clear and moist.  Eyes: Conjunctivae are normal. Pupils are equal, round, and reactive to light.  Neck:  Normal range of motion. Neck supple.  Cardiovascular: Normal rate and regular rhythm.   Pulmonary/Chest: Effort normal. No respiratory distress. She has wheezes. She has no rales. She exhibits no tenderness.  No rales  Abdominal: Soft. Bowel sounds are normal.  Lymphadenopathy:    She has no cervical adenopathy.  Neurological: She is alert and oriented to person, place, and time.  Skin: Skin is warm and dry.      Assessment & Plan:  1. Acute bronchitis, unspecified organism [J20.9] 2 lbs weight gain, no edema or PND, she will monitor weight.  - predniSONE (DELTASONE) 20 MG tablet; Take one pill two times daily for 3 days, take one pill daily for 4 days.  Dispense: 10 tablet; Refill: 0 - azithromycin (ZITHROMAX) 250 MG tablet; Take 2 tablets (500 mg) on  Day 1,  followed by 1 tablet (250 mg) once daily on Days 2 through 5.  Dispense: 6 each; Refill: 1 - HYDROcodone-acetaminophen (NORCO) 5-325 MG per tablet; 1/2-1 tablet at night for cough as needed  Dispense: 30 tablet; Refill: 0  2. Insomnia - good sleep hygiene discussed, increase day time activity, try melatonin or benadryl if this does not help we will call in sleep medication.

## 2014-06-08 NOTE — Patient Instructions (Signed)
Can try melatonin 5mg-15 mg at night for sleep, can also do benadryl 25-50mg at night for sleep.  If this does not help we can try prescription medication.  Also here is some information about good sleep hygiene.   Insomnia Insomnia is frequent trouble falling and/or staying asleep. Insomnia can be a long term problem or a short term problem. Both are common. Insomnia can be a short term problem when the wakefulness is related to a certain stress or worry. Long term insomnia is often related to ongoing stress during waking hours and/or poor sleeping habits. Overtime, sleep deprivation itself can make the problem worse. Every little thing feels more severe because you are overtired and your ability to cope is decreased. CAUSES  Stress, anxiety, and depression. Poor sleeping habits. Distractions such as TV in the bedroom. Naps close to bedtime. Engaging in emotionally charged conversations before bed. Technical reading before sleep. Alcohol and other sedatives. They may make the problem worse. They can hurt normal sleep patterns and normal dream activity. Stimulants such as caffeine for several hours prior to bedtime. Pain syndromes and shortness of breath can cause insomnia. Exercise late at night. Changing time zones may cause sleeping problems (jet lag). It is sometimes helpful to have someone observe your sleeping patterns. They should look for periods of not breathing during the night (sleep apnea). They should also look to see how long those periods last. If you live alone or observers are uncertain, you can also be observed at a sleep clinic where your sleep patterns will be professionally monitored. Sleep apnea requires a checkup and treatment. Give your caregivers your medical history. Give your caregivers observations your family has made about your sleep.  SYMPTOMS  Not feeling rested in the morning. Anxiety and restlessness at bedtime. Difficulty falling and staying asleep. TREATMENT   Your caregiver may prescribe treatment for an underlying medical disorders. Your caregiver can give advice or help if you are using alcohol or other drugs for self-medication. Treatment of underlying problems will usually eliminate insomnia problems. Medications can be prescribed for short time use. They are generally not recommended for lengthy use. Over-the-counter sleep medicines are not recommended for lengthy use. They can be habit forming. You can promote easier sleeping by making lifestyle changes such as: Using relaxation techniques that help with breathing and reduce muscle tension. Exercising earlier in the day. Changing your diet and the time of your last meal. No night time snacks. Establish a regular time to go to bed. Counseling can help with stressful problems and worry. Soothing music and white noise may be helpful if there are background noises you cannot remove. Stop tedious detailed work at least one hour before bedtime. HOME CARE INSTRUCTIONS  Keep a diary. Inform your caregiver about your progress. This includes any medication side effects. See your caregiver regularly. Take note of: Times when you are asleep. Times when you are awake during the night. The quality of your sleep. How you feel the next day. This information will help your caregiver care for you. Get out of bed if you are still awake after 15 minutes. Read or do some quiet activity. Keep the lights down. Wait until you feel sleepy and go back to bed. Keep regular sleeping and waking hours. Avoid naps. Exercise regularly. Avoid distractions at bedtime. Distractions include watching television or engaging in any intense or detailed activity like attempting to balance the household checkbook. Develop a bedtime ritual. Keep a familiar routine of bathing, brushing your teeth,   climbing into bed at the same time each night, listening to soothing music. Routines increase the success of falling to sleep faster. Use  relaxation techniques. This can be using breathing and muscle tension release routines. It can also include visualizing peaceful scenes. You can also help control troubling or intruding thoughts by keeping your mind occupied with boring or repetitive thoughts like the old concept of counting sheep. You can make it more creative like imagining planting one beautiful flower after another in your backyard garden. During your day, work to eliminate stress. When this is not possible use some of the previous suggestions to help reduce the anxiety that accompanies stressful situations. MAKE SURE YOU:  Understand these instructions. Will watch your condition. Will get help right away if you are not doing well or get worse. Document Released: 08/18/2000 Document Revised: 11/13/2011 Document Reviewed: 09/18/2007 [employer] Patient Information 2015 [employer]. This information is not intended to replace advice given to you by your health care provider. Make sure you discuss any questions you have with your health care provider.  

## 2014-06-15 LAB — MDC_IDC_ENUM_SESS_TYPE_REMOTE
Battery Voltage: 2.99 V
Implantable Pulse Generator Model: 3242
Implantable Pulse Generator Serial Number: 7548835
Lead Channel Impedance Value: 510 Ohm
Lead Channel Impedance Value: 650 Ohm
Lead Channel Pacing Threshold Amplitude: 2.625 V
Lead Channel Pacing Threshold Pulse Width: 0.6 ms
Lead Channel Sensing Intrinsic Amplitude: 7.4 mV
Lead Channel Setting Sensing Sensitivity: 3 mV
MDC IDC MSMT BATTERY REMAINING LONGEVITY: 70 mo
MDC IDC MSMT BATTERY REMAINING PERCENTAGE: 94 %
MDC IDC MSMT LEADCHNL RV PACING THRESHOLD AMPLITUDE: 0.75 V
MDC IDC MSMT LEADCHNL RV PACING THRESHOLD PULSEWIDTH: 0.4 ms
MDC IDC SESS DTM: 20150916154408
MDC IDC SET LEADCHNL LV PACING AMPLITUDE: 3.625
MDC IDC SET LEADCHNL LV PACING PULSEWIDTH: 0.6 ms
MDC IDC SET LEADCHNL RV PACING AMPLITUDE: 2 V
MDC IDC SET LEADCHNL RV PACING PULSEWIDTH: 0.4 ms

## 2014-06-30 ENCOUNTER — Ambulatory Visit (INDEPENDENT_AMBULATORY_CARE_PROVIDER_SITE_OTHER): Payer: Medicare Other | Admitting: *Deleted

## 2014-06-30 DIAGNOSIS — Z5181 Encounter for therapeutic drug level monitoring: Secondary | ICD-10-CM

## 2014-06-30 DIAGNOSIS — I4891 Unspecified atrial fibrillation: Secondary | ICD-10-CM

## 2014-06-30 LAB — POCT INR: INR: 2.4

## 2014-07-08 ENCOUNTER — Ambulatory Visit (INDEPENDENT_AMBULATORY_CARE_PROVIDER_SITE_OTHER): Payer: Medicare Other | Admitting: *Deleted

## 2014-07-08 DIAGNOSIS — Z45018 Encounter for adjustment and management of other part of cardiac pacemaker: Secondary | ICD-10-CM

## 2014-07-08 LAB — MDC_IDC_ENUM_SESS_TYPE_INCLINIC
Battery Remaining Longevity: 57.6 mo
Battery Voltage: 2.99 V
Brady Statistic RA Percent Paced: 0 %
Date Time Interrogation Session: 20151104122143
Implantable Pulse Generator Model: 3242
Lead Channel Impedance Value: 475 Ohm
Lead Channel Pacing Threshold Amplitude: 1.5 V
Lead Channel Pacing Threshold Pulse Width: 0.4 ms
Lead Channel Pacing Threshold Pulse Width: 0.6 ms
Lead Channel Setting Pacing Amplitude: 2.5 V
Lead Channel Setting Pacing Pulse Width: 0.4 ms
Lead Channel Setting Pacing Pulse Width: 0.6 ms
MDC IDC MSMT LEADCHNL LV IMPEDANCE VALUE: 400 Ohm
MDC IDC MSMT LEADCHNL LV PACING THRESHOLD AMPLITUDE: 2 V
MDC IDC MSMT LEADCHNL LV PACING THRESHOLD PULSEWIDTH: 0.6 ms
MDC IDC MSMT LEADCHNL RV PACING THRESHOLD AMPLITUDE: 0.75 V
MDC IDC MSMT LEADCHNL RV SENSING INTR AMPL: 7.9 mV
MDC IDC PG SERIAL: 7548835
MDC IDC SET LEADCHNL RV PACING AMPLITUDE: 2 V
MDC IDC SET LEADCHNL RV SENSING SENSITIVITY: 3 mV
MDC IDC STAT BRADY RV PERCENT PACED: 95 %

## 2014-07-08 NOTE — Progress Notes (Signed)
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 Follow up as planned. Merlin 08/05/14 & ROV w/ Dr. Rayann Heman 6/16.

## 2014-07-28 ENCOUNTER — Other Ambulatory Visit: Payer: Self-pay | Admitting: Internal Medicine

## 2014-08-11 ENCOUNTER — Ambulatory Visit (INDEPENDENT_AMBULATORY_CARE_PROVIDER_SITE_OTHER): Payer: Medicare Other

## 2014-08-11 DIAGNOSIS — Z5181 Encounter for therapeutic drug level monitoring: Secondary | ICD-10-CM

## 2014-08-11 DIAGNOSIS — I4891 Unspecified atrial fibrillation: Secondary | ICD-10-CM

## 2014-08-11 LAB — POCT INR: INR: 2.3

## 2014-08-13 ENCOUNTER — Encounter (HOSPITAL_COMMUNITY): Payer: Self-pay | Admitting: Internal Medicine

## 2014-08-24 ENCOUNTER — Ambulatory Visit: Payer: Medicare Other | Admitting: *Deleted

## 2014-08-24 ENCOUNTER — Encounter: Payer: Self-pay | Admitting: Internal Medicine

## 2014-08-25 ENCOUNTER — Encounter: Payer: Self-pay | Admitting: Internal Medicine

## 2014-08-25 ENCOUNTER — Ambulatory Visit (INDEPENDENT_AMBULATORY_CARE_PROVIDER_SITE_OTHER): Payer: Medicare Other | Admitting: Internal Medicine

## 2014-08-25 VITALS — BP 116/78 | HR 72 | Temp 97.9°F | Resp 16 | Ht 62.5 in | Wt 179.2 lb

## 2014-08-25 DIAGNOSIS — E559 Vitamin D deficiency, unspecified: Secondary | ICD-10-CM

## 2014-08-25 DIAGNOSIS — E782 Mixed hyperlipidemia: Secondary | ICD-10-CM

## 2014-08-25 DIAGNOSIS — E119 Type 2 diabetes mellitus without complications: Secondary | ICD-10-CM

## 2014-08-25 DIAGNOSIS — I10 Essential (primary) hypertension: Secondary | ICD-10-CM

## 2014-08-25 DIAGNOSIS — Z0001 Encounter for general adult medical examination with abnormal findings: Secondary | ICD-10-CM

## 2014-08-25 DIAGNOSIS — Z1331 Encounter for screening for depression: Secondary | ICD-10-CM

## 2014-08-25 DIAGNOSIS — Z9181 History of falling: Secondary | ICD-10-CM

## 2014-08-25 DIAGNOSIS — Z79899 Other long term (current) drug therapy: Secondary | ICD-10-CM

## 2014-08-25 DIAGNOSIS — Z23 Encounter for immunization: Secondary | ICD-10-CM

## 2014-08-25 DIAGNOSIS — Z1212 Encounter for screening for malignant neoplasm of rectum: Secondary | ICD-10-CM

## 2014-08-25 LAB — MDC_IDC_ENUM_SESS_TYPE_REMOTE
Battery Remaining Longevity: 82 mo
Battery Remaining Percentage: 92 %
Battery Voltage: 2.99 V
Date Time Interrogation Session: 20151221110127
Implantable Pulse Generator Model: 3242
Implantable Pulse Generator Serial Number: 7548835
Lead Channel Impedance Value: 430 Ohm
Lead Channel Impedance Value: 480 Ohm
Lead Channel Pacing Threshold Amplitude: 0.875 V
Lead Channel Pacing Threshold Amplitude: 1.75 V
Lead Channel Pacing Threshold Pulse Width: 0.6 ms
Lead Channel Setting Pacing Amplitude: 2 V
Lead Channel Setting Pacing Amplitude: 2.75 V
Lead Channel Setting Pacing Pulse Width: 0.6 ms
Lead Channel Setting Sensing Sensitivity: 3 mV
MDC IDC MSMT LEADCHNL RV PACING THRESHOLD PULSEWIDTH: 0.4 ms
MDC IDC MSMT LEADCHNL RV SENSING INTR AMPL: 6.9 mV
MDC IDC SET LEADCHNL RV PACING PULSEWIDTH: 0.4 ms

## 2014-08-25 LAB — CBC WITH DIFFERENTIAL/PLATELET
BASOS ABS: 0.1 10*3/uL (ref 0.0–0.1)
BASOS PCT: 1 % (ref 0–1)
EOS ABS: 0.1 10*3/uL (ref 0.0–0.7)
EOS PCT: 1 % (ref 0–5)
HEMATOCRIT: 44.3 % (ref 36.0–46.0)
Hemoglobin: 14.6 g/dL (ref 12.0–15.0)
Lymphocytes Relative: 33 % (ref 12–46)
Lymphs Abs: 1.7 10*3/uL (ref 0.7–4.0)
MCH: 29.2 pg (ref 26.0–34.0)
MCHC: 33 g/dL (ref 30.0–36.0)
MCV: 88.6 fL (ref 78.0–100.0)
MPV: 10 fL (ref 9.4–12.4)
Monocytes Absolute: 0.5 10*3/uL (ref 0.1–1.0)
Monocytes Relative: 10 % (ref 3–12)
Neutro Abs: 2.9 10*3/uL (ref 1.7–7.7)
Neutrophils Relative %: 55 % (ref 43–77)
PLATELETS: 228 10*3/uL (ref 150–400)
RBC: 5 MIL/uL (ref 3.87–5.11)
RDW: 13.9 % (ref 11.5–15.5)
WBC: 5.2 10*3/uL (ref 4.0–10.5)

## 2014-08-25 MED ORDER — METFORMIN HCL ER 500 MG PO TB24: ORAL_TABLET | ORAL | Status: DC

## 2014-08-25 NOTE — Patient Instructions (Signed)
Recommend the book "The END of DIETING" by Dr Baker Janus   and the book "The END of DIABETES " by Dr Excell Seltzer  At [email or web address] - get book & Audio CD's      Being diabetic has a  300% increased risk for heart attack, stroke, cancer, and alzheimer- type vascular dementia. It is very important that you work harder with diet by avoiding all foods that are white except chicken & fish. Avoid white rice (brown & wild rice is OK), white potatoes (sweetpotatoes in moderation is OK), White bread or wheat bread or anything made out of white flour like bagels, donuts, rolls, buns, biscuits, cakes, pastries, cookies, pizza crust, and pasta (made from white flour & egg whites) - vegetarian pasta or spinach or wheat pasta is OK. Multigrain breads like Arnold's or [employer], or multigrain sandwich thins or flatbreads.  Diet, exercise and weight loss can reverse and cure diabetes in the early stages.  Diet, exercise and weight loss is very important in the control and prevention of complications of diabetes which affects every system in your body, ie. Brain - dementia/stroke, eyes - glaucoma/blindness, heart - heart attack/heart failure, kidneys - dialysis, stomach - gastric paralysis, intestines - malabsorption, nerves - severe painful neuritis, circulation - gangrene & loss of a leg(s), and finally cancer and Alzheimers.    I recommend avoid fried & greasy foods,  sweets/candy, white rice (brown or wild rice or Quinoa is OK), white potatoes (sweet potatoes are OK) - anything made from white flour - bagels, doughnuts, rolls, buns, biscuits,white and wheat breads, pizza crust and traditional pasta made of white flour & egg white(vegetarian pasta or spinach or wheat pasta is OK).  Multi-grain bread is OK - like multi-grain flat bread or sandwich thins. Avoid alcohol in excess. Exercise is also important.    Eat all the vegetables you want - avoid meat, especially red meat and dairy - especially cheese.  Cheese  is the most concentrated form of trans-fats which is the worst thing to clog up our arteries. Veggie cheese is OK which can be found in the fresh produce section at [employer] or [employer] or [employer]  Preventive Care for Adults A healthy lifestyle and preventive care can promote health and wellness. Preventive health guidelines for women include the following key practices.  A routine yearly physical is a good way to check with your health care provider about your health and preventive screening. It is a chance to share any concerns and updates on your health and to receive a thorough exam.  Visit your dentist for a routine exam and preventive care every 6 months. Brush your teeth twice a day and floss once a day. Good oral hygiene prevents tooth decay and gum disease.  The frequency of eye exams is based on your age, health, family medical history, use of contact lenses, and other factors. Follow your health care provider's recommendations for frequency of eye exams.  Eat a healthy diet. Foods like vegetables, fruits, whole grains, low-fat dairy products, and lean protein foods contain the nutrients you need without too many calories. Decrease your intake of foods high in solid fats, added sugars, and salt. Eat the right amount of calories for you.Get information about a proper diet from your health care provider, if necessary.  Regular physical exercise is one of the most important things you can do for your health. Most adults should get at least 150 minutes of moderate-intensity exercise (any activity that increases  your heart rate and causes you to sweat) each week. In addition, most adults need muscle-strengthening exercises on 2 or more days a week.  Maintain a healthy weight. The body mass index (BMI) is a screening tool to identify possible weight problems. It provides an estimate of body fat based on height and weight. Your health care provider can find your BMI and can help you  achieve or maintain a healthy weight.For adults 20 years and older:  A BMI below 18.5 is considered underweight.  A BMI of 18.5 to 24.9 is normal.  A BMI of 25 to 29.9 is considered overweight.  A BMI of 30 and above is considered obese.  Maintain normal blood lipids and cholesterol levels by exercising and minimizing your intake of saturated fat. Eat a balanced diet with plenty of fruit and vegetables. If your lipid or cholesterol levels are high, you are over 50, or you are at high risk for heart disease, you may need your cholesterol levels checked more frequently.Ongoing high lipid and cholesterol levels should be treated with medicines if diet and exercise are not working.  If you smoke, find out from your health care provider how to quit. If you do not use tobacco, do not start.  Lung cancer screening is recommended for adults aged 55-80 years who are at high risk for developing lung cancer because of a history of smoking. A yearly low-dose CT scan of the lungs is recommended for people who have at least a 30-pack-year history of smoking and are a current smoker or have quit within the past 15 years. A pack year of smoking is smoking an average of 1 pack of cigarettes a day for 1 year (for example: 1 pack a day for 30 years or 2 packs a day for 15 years). Yearly screening should continue until the smoker has stopped smoking for at least 15 years. Yearly screening should be stopped for people who develop a health problem that would prevent them from having lung cancer treatment.  Avoid use of street drugs. Do not share needles with anyone. Ask for help if you need support or instructions about stopping the use of drugs.  High blood pressure causes heart disease and increases the risk of stroke.  Ongoing high blood pressure should be treated with medicines if weight loss and exercise do not work.  If you are 55-79 years old, ask your health care provider if you should take aspirin to  prevent strokes.  Diabetes screening involves taking a blood sample to check your fasting blood sugar level. This should be done once every 3 years, after age 45, if you are within normal weight and without risk factors for diabetes. Testing should be considered at a younger age or be carried out more frequently if you are overweight and have at least 1 risk factor for diabetes.  Breast cancer screening is essential preventive care for women. You should practice "breast self-awareness." This means understanding the normal appearance and feel of your breasts and may include breast self-examination. Any changes detected, no matter how small, should be reported to a health care provider. Women in their 20s and 30s should have a clinical breast exam (CBE) by a health care provider as part of a regular health exam every 1 to 3 years. After age 40, women should have a CBE every year. Starting at age 40, women should consider having a mammogram (breast X-ray test) every year. Women who have a family history of breast cancer should   talk to their health care provider about genetic screening. Women at a high risk of breast cancer should talk to their health care providers about having an MRI and a mammogram every year.  Breast cancer gene (BRCA)-related cancer risk assessment is recommended for women who have family members with BRCA-related cancers. BRCA-related cancers include breast, ovarian, tubal, and peritoneal cancers. Having family members with these cancers may be associated with an increased risk for harmful changes (mutations) in the breast cancer genes BRCA1 and BRCA2. Results of the assessment will determine the need for genetic counseling and BRCA1 and BRCA2 testing.  Routine pelvic exams to screen for cancer are no longer recommended for nonpregnant women who are considered low risk for cancer of the pelvic organs (ovaries, uterus, and vagina) and who do not have symptoms. Ask your health care provider  if a screening pelvic exam is right for you.  If you have had past treatment for cervical cancer or a condition that could lead to cancer, you need Pap tests and screening for cancer for at least 20 years after your treatment. If Pap tests have been discontinued, your risk factors (such as having a new sexual partner) need to be reassessed to determine if screening should be resumed. Some women have medical problems that increase the chance of getting cervical cancer. In these cases, your health care provider may recommend more frequent screening and Pap tests.    Colorectal cancer can be detected and often prevented. Most routine colorectal cancer screening begins at the age of 90 years and continues through age 8 years. However, your health care provider may recommend screening at an earlier age if you have risk factors for colon cancer. On a yearly basis, your health care provider may provide home test kits to check for hidden blood in the stool. Use of a small camera at the end of a tube, to directly examine the colon (sigmoidoscopy or colonoscopy), can detect the earliest forms of colorectal cancer. Talk to your health care provider about this at age 86, when routine screening begins. Direct exam of the colon should be repeated every 5-10 years through age 32 years, unless early forms of pre-cancerous polyps or small growths are found.  Osteoporosis is a disease in which the bones lose minerals and strength with aging. This can result in serious bone fractures or breaks. The risk of osteoporosis can be identified using a bone density scan. Women ages 75 years and over and women at risk for fractures or osteoporosis should discuss screening with their health care providers. Ask your health care provider whether you should take a calcium supplement or vitamin D to reduce the rate of osteoporosis.  Menopause can be associated with physical symptoms and risks. Hormone replacement therapy is available to  decrease symptoms and risks. You should talk to your health care provider about whether hormone replacement therapy is right for you.  Use sunscreen. Apply sunscreen liberally and repeatedly throughout the day. You should seek shade when your shadow is shorter than you. Protect yourself by wearing long sleeves, pants, a wide-brimmed hat, and sunglasses year round, whenever you are outdoors.  Once a month, do a whole body skin exam, using a mirror to look at the skin on your back. Tell your health care provider of new moles, moles that have irregular borders, moles that are larger than a pencil eraser, or moles that have changed in shape or color.  Stay current with required vaccines (immunizations).  Influenza vaccine. All adults  should be immunized every year.  Tetanus, diphtheria, and acellular pertussis (Td, Tdap) vaccine. Pregnant women should receive 1 dose of Tdap vaccine during each pregnancy. The dose should be obtained regardless of the length of time since the last dose. Immunization is preferred during the 27th-36th week of gestation. An adult who has not previously received Tdap or who does not know her vaccine status should receive 1 dose of Tdap. This initial dose should be followed by tetanus and diphtheria toxoids (Td) booster doses every 10 years. Adults with an unknown or incomplete history of completing a 3-dose immunization series with Td-containing vaccines should begin or complete a primary immunization series including a Tdap dose. Adults should receive a Td booster every 10 years.    Zoster vaccine. One dose is recommended for adults aged 44 years or older unless certain conditions are present.    Pneumococcal 13-valent conjugate (PCV13) vaccine. When indicated, a person who is uncertain of her immunization history and has no record of immunization should receive the PCV13 vaccine. An adult aged 74 years or older who has certain medical conditions and has not been previously  immunized should receive 1 dose of PCV13 vaccine. This PCV13 should be followed with a dose of pneumococcal polysaccharide (PPSV23) vaccine. The PPSV23 vaccine dose should be obtained at least 8 weeks after the dose of PCV13 vaccine. An adult aged 66 years or older who has certain medical conditions and previously received 1 or more doses of PPSV23 vaccine should receive 1 dose of PCV13. The PCV13 vaccine dose should be obtained 1 or more years after the last PPSV23 vaccine dose.    Pneumococcal polysaccharide (PPSV23) vaccine. When PCV13 is also indicated, PCV13 should be obtained first. All adults aged 89 years and older should be immunized. An adult younger than age 80 years who has certain medical conditions should be immunized. Any person who resides in a nursing home or long-term care facility should be immunized. An adult smoker should be immunized. People with an immunocompromised condition and certain other conditions should receive both PCV13 and PPSV23 vaccines. People with human immunodeficiency virus (HIV) infection should be immunized as soon as possible after diagnosis. Immunization during chemotherapy or radiation therapy should be avoided. Routine use of PPSV23 vaccine is not recommended for American Indians, [place] Natives, or people younger than 65 years unless there are medical conditions that require PPSV23 vaccine. When indicated, people who have unknown immunization and have no record of immunization should receive PPSV23 vaccine. One-time revaccination 5 years after the first dose of PPSV23 is recommended for people aged 19-64 years who have chronic kidney failure, nephrotic syndrome, asplenia, or immunocompromised conditions. People who received 1-2 doses of PPSV23 before age 34 years should receive another dose of PPSV23 vaccine at age 31 years or later if at least 5 years have passed since the previous dose. Doses of PPSV23 are not needed for people immunized with PPSV23 at or after  age 29 years.   Preventive Services / Frequency  Ages 55 years and over  Blood pressure check.  Lipid and cholesterol check.  Lung cancer screening. / Every year if you are aged 29-80 years and have a 30-pack-year history of smoking and currently smoke or have quit within the past 15 years. Yearly screening is stopped once you have quit smoking for at least 15 years or develop a health problem that would prevent you from having lung cancer treatment.  Clinical breast exam.** / Every year after age 40 years.  BRCA-related cancer risk assessment.** / For women who have family members with a BRCA-related cancer (breast, ovarian, tubal, or peritoneal cancers).  Mammogram.** / Every year beginning at age 40 years and continuing for as long as you are in good health. Consult with your health care provider.  Pap test.** / Every 3 years starting at age 30 years through age 65 or 70 years with 3 consecutive normal Pap tests. Testing can be stopped between 65 and 70 years with 3 consecutive normal Pap tests and no abnormal Pap or HPV tests in the past 10 years.  Fecal occult blood test (FOBT) of stool. / Every year beginning at age 50 years and continuing until age 75 years. You may not need to do this test if you get a colonoscopy every 10 years.  Flexible sigmoidoscopy or colonoscopy.** / Every 5 years for a flexible sigmoidoscopy or every 10 years for a colonoscopy beginning at age 50 years and continuing until age 75 years.  Hepatitis C blood test.** / For all people born from 1945 through 1965 and any individual with known risks for hepatitis C.  Osteoporosis screening.** / A one-time screening for women ages 65 years and over and women at risk for fractures or osteoporosis.  Skin self-exam. / Monthly.  Influenza vaccine. / Every year.  Tetanus, diphtheria, and acellular pertussis (Tdap/Td) vaccine.** / 1 dose of Td every 10 years.  Zoster vaccine.** / 1 dose for adults aged 60 years  or older.  Pneumococcal 13-valent conjugate (PCV13) vaccine.** / Consult your health care provider.  Pneumococcal polysaccharide (PPSV23) vaccine.** / 1 dose for all adults aged 65 years and older. Screening for abdominal aortic aneurysm (AAA)  by ultrasound is recommended for people who have history of high blood pressure or who are current or former smokers. 

## 2014-08-25 NOTE — Addendum Note (Signed)
Addended by: Unk Pinto on: 08/25/2014 09:01 PM   Modules accepted: Orders, Medications

## 2014-08-25 NOTE — Progress Notes (Signed)
Patient ID: Anita Duke, female   DOB: [birth year], 73 y.o.   MRN: [id number] Annual  Comprehensive Examination  This very nice 73 y.o.MWF presents for complete physical.  Patient has been followed for HTN, ASCAD s/p CABG & MiVR, hx/o CHB w/ Pacemaker, T2_NIDDM, Hyperlipidemia, and Vitamin D Deficiency.    HTN predates since 20 alto tx wasn't started until 1990 . Patient's BP has been controlled at home and patient denies any cardiac symptoms as chest pain, palpitations, shortness of breath, dizziness or ankle swelling. Today's BP: 116/78 mmHg. In 2001, patient underwent CABG & MiVR and had hx/o cAfib- failing CV and ultimately had permanent pacemaker inserted for CHB. She is followed closely by Dr Ron Parker.   Patient's hyperlipidemia is controlled with diet and medications. Patient denies myalgias or other medication SE's. Last lipids were at goal - Total Chol 134; HDL 50; LDL 41; Trig 215* on 05/26/2014.   Patient has T2_NIDDM  predating since 2005 and patient denies reactive hypoglycemic symptoms, visual blurring, diabetic polys, or paresthesias. Parient has had difficulting Immediate release Metformin due to diarrhea. Last A1c was 7.0 % on 05/26/2014.   Finally, patient has history of Vitamin D Deficiency of 16 in 2012 and last Vitamin D was 79 on 05/26/2014.  Medication Sig  . Acetaminophen 500 MG tablet Take 1,000 mg by mouth every 6 (six) hours as needed. For pain  . atorvastatin  40 MG tablet Take 1 tablet by mouth  daily  . SYMBICORT 160-4.5  inhaler Inhale 2 puffs into the lungs as needed.   . OS-CAL 600 MG TABS Take 600 mg by mouth daily.   . carvedilol  6.25 MG tablet Take 1 tablet (6.25 mg total) by mouth 2 (two) times daily with a meal.  . VITAMIN D)1000 UNITS tablet Take 4,000 Units by mouth daily.  . fish oil 1000 MG capsule Take 1 g by mouth 2 (two) times daily.   . furosemide (LASIX) 40 MG tablet Take 0.5 tablets (20 mg total) by mouth daily.  Marland Kitchen loperamide (IMODIUM) 2 MG capsule  Take 1 capsule (2 mg total) by mouth daily as needed   . loratadine (CLARITIN) 10 MG tablet Take 10 mg by mouth daily.  . metFORMIN  500 MG tablet Take 2 tablets by mouth  twice a day with meals  . KLOR-CON 20 MEQ tablet Take 3 tablets (60 mEq total) by mouth daily.  . trandolapril (MAVIK) 4 MG tablet Take 1 tablet by mouth two  times daily for blood  pressure  . EFFEXOR-XR 37.5 MG 24 hr capsule Take 1 capsule by mouth  every evening  . warfarin (COUMADIN) 2.5 MG tablet Take as directed by   anticoagulation clinic   Allergies  Allergen Reactions  . Demerol [Meperidine] Nausea And Vomiting  . Morphine Other (See Comments)    vomiting  . Tetanus Toxoid Rash    "years ago"   Past Medical History  Diagnosis Date  . Mitral regurgitation     Treated with repair, atrial appendage was removed or tied off at surgery.. this was proven by TEE February, 2011  . Pulmonary HTN     55 mmHg, echo, February, 2011 / no mention of pulmonary hypertension echo, June, 2011  . Right ventricular dysfunction     Mild to moderate, echo, February, 2011 / normalized echo, June, 2011  . TR (tricuspid regurgitation)     Moderate, echo, February, 2011 / trivial, echo, June, 2011  . Mitral stenosis  Mild, February, 2011, post mitral valve repair / mild, echo, June, 2011  . CAD (coronary artery disease)     LIMA to the LAD at time of mitral valve repair / LIMA atretic,, February, 2011  . Ejection fraction < 50%     EF 30% echo and TEE diagnosis February, 2011, possibly rate related / no contraction by catheter, bradycardia so carvedilol cannot be used / EF 55-60% echo, June, 2011 / EF 50%, echo, February, 2012  . Hemorrhoids, internal   . Diverticulosis of colon   . Colon polyp, hyperplastic   . H/O amiodarone therapy     Satrted 02/12/2012  . High cholesterol   . Asthma   . COPD (chronic obstructive pulmonary disease)   . HTN (hypertension)     takes Mavik daily  . Myocardial infarction 04/2012  ?  . CHF  (congestive heart failure) 2011    Mild in hospital February, 2011;takes Lasix daily  . Seasonal allergies     takes Claritin daily  . Type II diabetes mellitus     takes Metformin daily  . Cataracts, bilateral     immature  . Depression     takes Effexor daily  . Atrial fibrillation     Treated with maze procedure / recurrent atrial fibrillation February, 2011... TEE cardioversion October 19, 2009; s/p AVN ablatin  . QT prolongation     Tikosyn and Effexor. QT prolonged October 13, 2011, peak is in dose reduced from 500  to -250 twice a day  . Anemia     Hemoglobin 10.4, December, 2013  . GERD (gastroesophageal reflux disease)   . Arthritis     "not bad; little in my hands; some in my knees" (11/04/2013)  . Cecal cancer 2014   Health Maintenance  Topic Date Due  . OPHTHALMOLOGY EXAM  01/10/1950  . TETANUS/TDAP  01/11/1959  . DEXA SCAN  01/10/2005  . INFLUENZA VACCINE  04/04/2014  . URINE MICROALBUMIN  08/22/2014  . FOOT EXAM  11/21/2014  . HEMOGLOBIN A1C  11/24/2014  . MAMMOGRAM  01/08/2016  . COLONOSCOPY  01/06/2017  . PNEUMOCOCCAL POLYSACCHARIDE VACCINE AGE 12 AND OVER  Completed  . ZOSTAVAX  Completed   Immunization History  Administered Date(s) Administered  . Influenza Split 06/24/2011  . Influenza Whole 06/04/2012, 05/16/2013  . Pneumococcal Conjugate-13 08/25/2014  . Pneumococcal Polysaccharide-23 08/10/2011  . Zoster 05/23/2006   Past Surgical History  Procedure Laterality Date  . Mitral valve repair  2001    "anterior and posterior leaflets" (12/04/2012)  . Maze  2001    w/ MVR & CABG  . Cardioversion  02/08/2012    Procedure: CARDIOVERSION;  Surgeon: Carlena Bjornstad, MD;  Location: [place];  Service: Cardiovascular;  Laterality: N/A;  . Cardioversion  02/29/2012    Procedure: CARDIOVERSION;  Surgeon: Carlena Bjornstad, MD;  Location: [place];  Service: Cardiovascular;  Laterality: N/A;  . Cardioversion  05/15/2012    Procedure: CARDIOVERSION;  Surgeon: Carlena Bjornstad, MD;  Location: [facility] ENDOSCOPY;  Service: Cardiovascular;  Laterality: N/A;  . Tonsillectomy and adenoidectomy  ~ 1950  . 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 Av node ablation  05/28/12  . Cardiac catheterization  05/2012  . Laparoscopic right hemi colectomy  12/03/2012    Procedure: LAPAROSCOPIC RIGHT HEMI COLECTOMY;  Surgeon: Stark Klein, MD;  Location: [place];  Service: General;;  . Umbilical hernia repair N/A 12/03/2012  Procedure: HERNIA REPAIR UMBILICAL ADULT;  Surgeon: Stark Klein, MD;  Location: [place];  Service: General;  Laterality: N/A;  . Diagnostic laparoscopic liver biopsy Left 12/03/2012    Procedure: DIAGNOSTIC LAPAROSCOPIC LIVER BIOPSY;  Surgeon: Stark Klein, MD;  Location: [place];  Service: General;  Laterality: Left;  . Coronary artery bypass graft  2001    CABG X1 "at time of mitral valve repair" (12/04/2012  . Breast lumpectomy Bilateral 1990's  . Cholecystectomy  1995  . Abdominal hysterectomy  ?2002  . Bi-ventricular pacemaker upgrade  11/04/2013    upgrade of previously implanted dual chamber pacemaker to STJ Quadra Allure CRTP by Dr Rayann Heman  . Permanent pacemaker insertion N/A 05/17/2012    Procedure: PERMANENT PACEMAKER INSERTION;  Surgeon: Thompson Grayer, MD;  Location: [facility] CATH LAB;  Service: Cardiovascular;  Laterality: N/A;  . Av node ablation N/A 05/28/2012    Procedure: AV NODE ABLATION;  Surgeon: Thompson Grayer, MD;  Location: [facility] CATH LAB;  Service: Cardiovascular;  Laterality: N/A;  . Bi-ventricular pacemaker upgrade N/A 11/04/2013    Procedure: BI-VENTRICULAR PACEMAKER UPGRADE;  Surgeon: Coralyn Mark, MD;  Location: [place] CATH LAB;  Service: Cardiovascular;  Laterality: N/A;   Family History  Problem Relation Age of Onset  . Pancreatic cancer Maternal Grandfather   . Heart disease Father   . Diabetes Father   . Heart disease Sister     x 2  . Diabetes Mother   . Kidney disease Mother   . Colon cancer Neg  Hx   . Diabetes Paternal Grandfather   . Diabetes Paternal Grandmother   . Heart disease Paternal Uncle     x 6  . Prostate cancer Paternal Uncle   . Colon polyps Neg Hx    History  Substance Use Topics  . Smoking status: Never Smoker   . Smokeless tobacco: Never Used  . Alcohol Use: No    ROS Constitutional: Denies fever, chills, weight loss/gain, headaches, insomnia, fatigue, night sweats, and change in appetite. Eyes: Denies redness, blurred vision, diplopia, discharge, itchy, watery eyes.  ENT: Denies discharge, congestion, post nasal drip, epistaxis, sore throat, earache, hearing loss, dental pain, Tinnitus, Vertigo, Sinus pain, snoring.  Cardio: Denies chest pain, palpitations, irregular heartbeat, syncope, dyspnea, diaphoresis, orthopnea, PND, claudication, edema Respiratory: denies cough, dyspnea, DOE, pleurisy, hoarseness, laryngitis, wheezing.  Gastrointestinal: Denies dysphagia, heartburn, reflux, water brash, pain, cramps, nausea, vomiting, bloating, diarrhea, constipation, hematemesis, melena, hematochezia, jaundice, hemorrhoids Genitourinary: Denies dysuria, frequency, urgency, nocturia, hesitancy, discharge, hematuria, flank pain Breast: Breast lumps, nipple discharge, bleeding.  Musculoskeletal: Denies arthralgia, myalgia, stiffness, Jt. Swelling, pain, limp, and strain/sprain. Denies falls. Skin: Denies puritis, rash, hives, warts, acne, eczema, changing in skin lesion Neuro: No weakness, tremor, incoordination, spasms, paresthesia, pain Psychiatric: Denies confusion, memory loss, sensory loss. Denies Depression. Endocrine: Denies change in weight, skin, hair change, nocturia, and paresthesia, diabetic polys, visual blurring, hyper / hypo glycemic episodes.  Heme/Lymph: No excessive bleeding, bruising, enlarged lymph nodes.  Physical Exam  BP 116/78   Pulse 72  Temp 97.9 F   Resp 16  Ht 5' 2.5"   Wt 179 lb 3.2 oz        BMI 32.23   General Appearance: Well  nourished and in no apparent distress. Eyes: PERRLA, EOMs, conjunctiva no swelling or erythema, normal fundi and vessels. Sinuses: No frontal/maxillary tenderness ENT/Mouth: EACs patent / TMs  nl. Nares clear without erythema, swelling, mucoid exudates. Oral hygiene is good. No erythema, swelling, or exudate. Tongue normal, non-obstructing.  Tonsils not swollen or erythematous. Hearing normal.  Neck: Supple, thyroid normal. No bruits, nodes or JVD. Respiratory: Respiratory effort normal.  BS equal and clear bilateral without rales, rhonci, wheezing or stridor. Cardio: Heart sounds are normal with regular rate and rhythm and no murmurs, rubs or gallops. Peripheral pulses are normal and equal bilaterally without edema. No aortic or femoral bruits. Chest: symmetric with normal excursions and percussion. Breasts: Symmetric, without lumps, nipple discharge, retractions, or fibrocystic changes.  Abdomen: Flat, soft, with bowl sounds. Nontender, no guarding, rebound, hernias, masses, or organomegaly.  Lymphatics: Non tender without lymphadenopathy.  Genitourinary:  Musculoskeletal: Full ROM all peripheral extremities, joint stability, 5/5 strength, and normal gait. Skin: Warm and dry without rashes, lesions, cyanosis, clubbing or  ecchymosis.  Neuro: Cranial nerves intact, reflexes equal bilaterally. Normal muscle tone, no cerebellar symptoms. Sensation intact to monofilament testing to the distal LE's.   Pysch: Awake and oriented X 3, normal affect, Insight and Judgment appropriate.   Assessment and Plan  1. Annual Preventative Screening Examination 2. Hypertension  3. ASCAD / CABG / s/p MVR / CHB - Pacemaker 4. ch A fib 5. Hx/o Colon Cancer (12/2012) 6. Hyperlipidemia 4. T2_NIDDM 5. Vitamin D Deficiency   Continue prudent diet as discussed, weight control, BP monitoring, regular exercise, and medications. Discussed med's effects and SE's. Screening labs and tests as requested with regular  follow-up as recommended.

## 2014-08-26 LAB — LIPID PANEL
CHOLESTEROL: 110 mg/dL (ref 0–200)
HDL: 44 mg/dL (ref >39)
LDL CALC: 37 mg/dL (ref 0–99)
Total CHOL/HDL Ratio: 2.5 Ratio
Triglycerides: 143 mg/dL (ref <150)
VLDL: 29 mg/dL (ref 0–40)

## 2014-08-26 LAB — MICROALBUMIN / CREATININE URINE RATIO
Creatinine, Urine: 108.8 mg/dL
MICROALB UR: 0.6 mg/dL (ref <2.0)
Microalb Creat Ratio: 5.5 mg/g (ref 0.0–30.0)

## 2014-08-26 LAB — VITAMIN D 25 HYDROXY (VIT D DEFICIENCY, FRACTURES): Vit D, 25-Hydroxy: 55 ng/mL (ref 30–100)

## 2014-08-26 LAB — HEPATIC FUNCTION PANEL
ALT: 18 U/L (ref 0–35)
AST: 20 U/L (ref 0–37)
Albumin: 4.4 g/dL (ref 3.5–5.2)
Alkaline Phosphatase: 69 U/L (ref 39–117)
Bilirubin, Direct: 0.3 mg/dL (ref 0.0–0.3)
Indirect Bilirubin: 1 mg/dL (ref 0.2–1.2)
TOTAL PROTEIN: 6.6 g/dL (ref 6.0–8.3)
Total Bilirubin: 1.3 mg/dL — ABNORMAL HIGH (ref 0.2–1.2)

## 2014-08-26 LAB — HEMOGLOBIN A1C
Hgb A1c MFr Bld: 7.1 % — ABNORMAL HIGH (ref <5.7)
MEAN PLASMA GLUCOSE: 157 mg/dL — AB (ref <117)

## 2014-08-26 LAB — URINALYSIS, MICROSCOPIC ONLY
BACTERIA UA: NONE SEEN
CASTS: NONE SEEN
Crystals: NONE SEEN
Squamous Epithelial / LPF: NONE SEEN

## 2014-08-26 LAB — MAGNESIUM: Magnesium: 1.9 mg/dL (ref 1.5–2.5)

## 2014-08-26 LAB — BASIC METABOLIC PANEL WITH GFR
BUN: 13 mg/dL (ref 6–23)
CO2: 28 mEq/L (ref 19–32)
CREATININE: 0.57 mg/dL (ref 0.50–1.10)
Calcium: 9.4 mg/dL (ref 8.4–10.5)
Chloride: 104 mEq/L (ref 96–112)
Glucose, Bld: 118 mg/dL — ABNORMAL HIGH (ref 70–99)
Potassium: 4.4 mEq/L (ref 3.5–5.3)
Sodium: 140 mEq/L (ref 135–145)

## 2014-08-26 LAB — INSULIN, FASTING: INSULIN FASTING, SERUM: 5 u[IU]/mL (ref 2.0–19.6)

## 2014-08-26 LAB — TSH: TSH: 0.936 u[IU]/mL (ref 0.350–4.500)

## 2014-08-30 ENCOUNTER — Other Ambulatory Visit: Payer: Self-pay | Admitting: Cardiology

## 2014-09-01 ENCOUNTER — Encounter: Payer: Self-pay | Admitting: Cardiology

## 2014-09-18 ENCOUNTER — Encounter: Payer: Self-pay | Admitting: Cardiology

## 2014-10-05 ENCOUNTER — Other Ambulatory Visit: Payer: Self-pay

## 2014-10-05 ENCOUNTER — Ambulatory Visit (INDEPENDENT_AMBULATORY_CARE_PROVIDER_SITE_OTHER): Payer: Self-pay

## 2014-10-05 DIAGNOSIS — I4891 Unspecified atrial fibrillation: Secondary | ICD-10-CM

## 2014-10-05 LAB — PROTIME-INR: INR: 2.4 — AB (ref 0.9–1.1)

## 2014-10-06 LAB — PROTIME-INR
INR: 2.4 — AB (ref 0.8–1.2)
PROTHROMBIN TIME: 25.1 s — AB (ref 9.1–12.0)

## 2014-10-13 ENCOUNTER — Encounter: Payer: Self-pay | Admitting: Internal Medicine

## 2014-11-13 ENCOUNTER — Other Ambulatory Visit: Payer: Self-pay

## 2014-11-13 DIAGNOSIS — Z1231 Encounter for screening mammogram for malignant neoplasm of breast: Secondary | ICD-10-CM

## 2014-11-15 ENCOUNTER — Other Ambulatory Visit: Payer: Self-pay | Admitting: Internal Medicine

## 2014-11-16 ENCOUNTER — Ambulatory Visit (INDEPENDENT_AMBULATORY_CARE_PROVIDER_SITE_OTHER): Payer: Medicare Other | Admitting: *Deleted

## 2014-11-16 DIAGNOSIS — I4891 Unspecified atrial fibrillation: Secondary | ICD-10-CM

## 2014-11-16 DIAGNOSIS — Z5181 Encounter for therapeutic drug level monitoring: Secondary | ICD-10-CM

## 2014-11-16 LAB — POCT INR: INR: 2.9

## 2014-11-18 ENCOUNTER — Other Ambulatory Visit: Payer: Self-pay | Admitting: Cardiology

## 2014-11-23 ENCOUNTER — Ambulatory Visit (INDEPENDENT_AMBULATORY_CARE_PROVIDER_SITE_OTHER): Payer: Medicare Other | Admitting: *Deleted

## 2014-11-23 ENCOUNTER — Ambulatory Visit: Payer: Medicare Other

## 2014-11-23 DIAGNOSIS — I519 Heart disease, unspecified: Secondary | ICD-10-CM

## 2014-11-23 DIAGNOSIS — Z950 Presence of cardiac pacemaker: Secondary | ICD-10-CM | POA: Diagnosis not present

## 2014-11-23 DIAGNOSIS — I495 Sick sinus syndrome: Secondary | ICD-10-CM

## 2014-11-23 NOTE — Progress Notes (Signed)
Remote pacemaker transmission.   

## 2014-11-24 ENCOUNTER — Ambulatory Visit: Payer: Self-pay | Admitting: Physician Assistant

## 2014-11-27 ENCOUNTER — Encounter: Payer: Self-pay | Admitting: *Deleted

## 2014-11-27 ENCOUNTER — Telehealth: Payer: Self-pay | Admitting: *Deleted

## 2014-11-27 LAB — MDC_IDC_ENUM_SESS_TYPE_REMOTE
Battery Remaining Percentage: 95 %
Battery Voltage: 2.99 V
Implantable Pulse Generator Serial Number: 7548835
Lead Channel Impedance Value: 410 Ohm
Lead Channel Impedance Value: 480 Ohm
Lead Channel Pacing Threshold Amplitude: 0.75 V
Lead Channel Pacing Threshold Pulse Width: 0.6 ms
Lead Channel Sensing Intrinsic Amplitude: 12 mV
Lead Channel Setting Pacing Pulse Width: 0.6 ms
Lead Channel Setting Sensing Sensitivity: 3 mV
MDC IDC MSMT LEADCHNL LV PACING THRESHOLD AMPLITUDE: 1.375 V
MDC IDC MSMT LEADCHNL RV PACING THRESHOLD PULSEWIDTH: 0.4 ms
MDC IDC PG MODEL: 3242
MDC IDC SET LEADCHNL LV PACING AMPLITUDE: 2.75 V
MDC IDC SET LEADCHNL RV PACING AMPLITUDE: 2 V
MDC IDC SET LEADCHNL RV PACING PULSEWIDTH: 0.4 ms
MDC IDC STAT BRADY RV PERCENT PACED: 99 %

## 2014-11-27 NOTE — Addendum Note (Signed)
Addended byAlvis Lemmings C on: 11/27/2014 11:08 AM   Modules accepted: Level of Service

## 2014-11-27 NOTE — Telephone Encounter (Signed)
I spoke with the patient. 

## 2014-11-27 NOTE — Progress Notes (Signed)
EPIC Encounter for ICM Monitoring  Patient Name: Anita Duke is a 74 y.o. female Date: 11/27/2014 Primary Care Physican: Alesia Richards, MD Primary Cardiologist: Ron Parker Electrophysiologist: Allred Dry Weight: 179 lbs   Bi-V pacing: 99%      In the past month, have you:  1. Gained more than 2 pounds in a day or more than 5 pounds in a week? Uncertain. She does not have a scale and does not weigh on a regular basis.   2. Had changes in your medications (with verification of current medications)? no  3. Had more shortness of breath than is usual for you? no  4. Limited your activity because of shortness of breath? no  5. Not been able to sleep because of shortness of breath? no  6. Had increased swelling in your feet or ankles? no  7. Had symptoms of dehydration (dizziness, dry mouth, increased thirst, decreased urine output) no  8. Had changes in sodium restriction? no  9. Been compliant with medication? Yes   ICM trend:   Follow-up plan: ICM clinic phone appointment: 12/28/2014. The patient's impedence was below baseline from ~3/6- 3/14. Per the patient's report, they were out of town around that time and their diet was different than the normal for her. She will also tend to skip doses of her lasix when they travel. No changes made today.   Copy of note sent to patient's primary care physician, primary cardiologist, and device following physician.  Alvis Lemmings, RN, BSN 11/27/2014 11:00 AM

## 2014-11-27 NOTE — Telephone Encounter (Signed)
ICM transmission received. I left a message for the patient to call at her home and cell #.

## 2014-12-02 ENCOUNTER — Encounter: Payer: Self-pay | Admitting: Physician Assistant

## 2014-12-02 ENCOUNTER — Ambulatory Visit (INDEPENDENT_AMBULATORY_CARE_PROVIDER_SITE_OTHER): Payer: Medicare Other | Admitting: Physician Assistant

## 2014-12-02 VITALS — BP 132/72 | HR 72 | Temp 97.9°F | Resp 16 | Ht 62.5 in | Wt 189.0 lb

## 2014-12-02 DIAGNOSIS — E119 Type 2 diabetes mellitus without complications: Secondary | ICD-10-CM | POA: Diagnosis not present

## 2014-12-02 DIAGNOSIS — E669 Obesity, unspecified: Secondary | ICD-10-CM | POA: Insufficient documentation

## 2014-12-02 DIAGNOSIS — K5730 Diverticulosis of large intestine without perforation or abscess without bleeding: Secondary | ICD-10-CM

## 2014-12-02 DIAGNOSIS — Z0001 Encounter for general adult medical examination with abnormal findings: Secondary | ICD-10-CM | POA: Diagnosis not present

## 2014-12-02 DIAGNOSIS — I442 Atrioventricular block, complete: Secondary | ICD-10-CM

## 2014-12-02 DIAGNOSIS — R9430 Abnormal result of cardiovascular function study, unspecified: Secondary | ICD-10-CM

## 2014-12-02 DIAGNOSIS — I519 Heart disease, unspecified: Secondary | ICD-10-CM

## 2014-12-02 DIAGNOSIS — Z950 Presence of cardiac pacemaker: Secondary | ICD-10-CM

## 2014-12-02 DIAGNOSIS — I050 Rheumatic mitral stenosis: Secondary | ICD-10-CM

## 2014-12-02 DIAGNOSIS — I25810 Atherosclerosis of coronary artery bypass graft(s) without angina pectoris: Secondary | ICD-10-CM

## 2014-12-02 DIAGNOSIS — J4520 Mild intermittent asthma, uncomplicated: Secondary | ICD-10-CM

## 2014-12-02 DIAGNOSIS — E782 Mixed hyperlipidemia: Secondary | ICD-10-CM | POA: Diagnosis not present

## 2014-12-02 DIAGNOSIS — Z1331 Encounter for screening for depression: Secondary | ICD-10-CM

## 2014-12-02 DIAGNOSIS — C180 Malignant neoplasm of cecum: Secondary | ICD-10-CM

## 2014-12-02 DIAGNOSIS — Z79899 Other long term (current) drug therapy: Secondary | ICD-10-CM

## 2014-12-02 DIAGNOSIS — I495 Sick sinus syndrome: Secondary | ICD-10-CM

## 2014-12-02 DIAGNOSIS — E559 Vitamin D deficiency, unspecified: Secondary | ICD-10-CM

## 2014-12-02 DIAGNOSIS — I10 Essential (primary) hypertension: Secondary | ICD-10-CM | POA: Diagnosis not present

## 2014-12-02 DIAGNOSIS — Z9889 Other specified postprocedural states: Secondary | ICD-10-CM

## 2014-12-02 DIAGNOSIS — R6889 Other general symptoms and signs: Secondary | ICD-10-CM | POA: Diagnosis not present

## 2014-12-02 DIAGNOSIS — Z9181 History of falling: Secondary | ICD-10-CM

## 2014-12-02 DIAGNOSIS — Z951 Presence of aortocoronary bypass graft: Secondary | ICD-10-CM

## 2014-12-02 DIAGNOSIS — I340 Nonrheumatic mitral (valve) insufficiency: Secondary | ICD-10-CM

## 2014-12-02 DIAGNOSIS — I4891 Unspecified atrial fibrillation: Secondary | ICD-10-CM

## 2014-12-02 DIAGNOSIS — D508 Other iron deficiency anemias: Secondary | ICD-10-CM

## 2014-12-02 DIAGNOSIS — Z7901 Long term (current) use of anticoagulants: Secondary | ICD-10-CM

## 2014-12-02 DIAGNOSIS — J309 Allergic rhinitis, unspecified: Secondary | ICD-10-CM

## 2014-12-02 LAB — LIPID PANEL
CHOL/HDL RATIO: 3.1 ratio
CHOLESTEROL: 138 mg/dL (ref 0–200)
HDL: 44 mg/dL — AB (ref >=46)
LDL Cholesterol: 60 mg/dL (ref 0–99)
Triglycerides: 171 mg/dL — ABNORMAL HIGH (ref <150)
VLDL: 34 mg/dL (ref 0–40)

## 2014-12-02 LAB — TSH: TSH: 0.533 u[IU]/mL (ref 0.350–4.500)

## 2014-12-02 LAB — CBC WITH DIFFERENTIAL/PLATELET
Basophils Absolute: 0.1 10*3/uL (ref 0.0–0.1)
Basophils Relative: 1 % (ref 0–1)
Eosinophils Absolute: 0.1 10*3/uL (ref 0.0–0.7)
Eosinophils Relative: 2 % (ref 0–5)
HCT: 41.6 % (ref 36.0–46.0)
Hemoglobin: 14 g/dL (ref 12.0–15.0)
LYMPHS ABS: 1.6 10*3/uL (ref 0.7–4.0)
Lymphocytes Relative: 28 % (ref 12–46)
MCH: 28.7 pg (ref 26.0–34.0)
MCHC: 33.7 g/dL (ref 30.0–36.0)
MCV: 85.2 fL (ref 78.0–100.0)
MPV: 9.9 fL (ref 8.6–12.4)
Monocytes Absolute: 0.6 10*3/uL (ref 0.1–1.0)
Monocytes Relative: 11 % (ref 3–12)
NEUTROS PCT: 58 % (ref 43–77)
Neutro Abs: 3.4 10*3/uL (ref 1.7–7.7)
PLATELETS: 189 10*3/uL (ref 150–400)
RBC: 4.88 MIL/uL (ref 3.87–5.11)
RDW: 13.6 % (ref 11.5–15.5)
WBC: 5.8 10*3/uL (ref 4.0–10.5)

## 2014-12-02 LAB — HEPATIC FUNCTION PANEL
ALT: 27 U/L (ref 0–35)
AST: 24 U/L (ref 0–37)
Albumin: 4 g/dL (ref 3.5–5.2)
Alkaline Phosphatase: 76 U/L (ref 39–117)
BILIRUBIN DIRECT: 0.2 mg/dL (ref 0.0–0.3)
Indirect Bilirubin: 0.8 mg/dL (ref 0.2–1.2)
Total Bilirubin: 1 mg/dL (ref 0.2–1.2)
Total Protein: 6.5 g/dL (ref 6.0–8.3)

## 2014-12-02 LAB — BASIC METABOLIC PANEL WITH GFR
BUN: 13 mg/dL (ref 6–23)
CHLORIDE: 104 meq/L (ref 96–112)
CO2: 28 mEq/L (ref 19–32)
Calcium: 9.3 mg/dL (ref 8.4–10.5)
Creat: 0.55 mg/dL (ref 0.50–1.10)
GFR, Est African American: >89 mL/min
GLUCOSE: 122 mg/dL — AB (ref 70–99)
Potassium: 4.3 mEq/L (ref 3.5–5.3)
SODIUM: 141 meq/L (ref 135–145)

## 2014-12-02 LAB — MAGNESIUM: Magnesium: 1.7 mg/dL (ref 1.5–2.5)

## 2014-12-02 LAB — HEMOGLOBIN A1C
Hgb A1c MFr Bld: 7.6 % — ABNORMAL HIGH (ref <5.7)
MEAN PLASMA GLUCOSE: 171 mg/dL — AB (ref <117)

## 2014-12-02 NOTE — Progress Notes (Signed)
MEDICARE ANNUAL WELLNESS VISIT AND FOLLOW UP  Assessment:   1. Essential hypertension - continue medications, DASH diet, exercise and monitor at home. Call if greater than 130/80.  - CBC with Differential/Platelet - BASIC METABOLIC PANEL WITH GFR - Hepatic function panel - TSH  2. Type 2 diabetes mellitus without complication Discussed general issues about diabetes pathophysiology and management., Educational material distributed., Suggested low cholesterol diet., Encouraged aerobic exercise., Discussed foot care., Reminded to get yearly retinal exam. - Hemoglobin A1c - HM DIABETES FOOT EXAM  3. Hyperlipidemia -continue medications, check lipids, decrease fatty foods, increase activity.  - Lipid panel  4. Vitamin D deficiency - Vit D  25 hydroxy (rtn osteoporosis monitoring)  5. Medication management - Magnesium  6. Obesity Obesity with co morbidities- long discussion about weight loss, diet, and exercise  7. Atrial fibrillation, unspecified Continue coumadin Continue cardio follow up  8. Mitral regurgitation Continue cardio follow up  9. Right ventricular dysfunction Weight stable, Control blood pressure, cholesterol, glucose, increase exercise.   10. Mitral stenosis Continue cardio follow up  11. Sick sinus syndrome Has pacemaker, Continue cardio follow up  12. Chronic systolic dysfunction of left ventricle Weight stable, Control blood pressure, cholesterol, glucose, increase exercise.   13. Coronary artery disease involving autologous vein coronary bypass graft without angina pectoris Control blood pressure, cholesterol, glucose, increase exercise.  Continue cardio follow up  14. 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 Mild intermittent asthma, uncomplicated controlled  16. Allergic rhinitis, unspecified allergic rhinitis type Continue OTC allergy pills  17. Diverticulosis of large intestine without hemorrhage occ diarrhea since surgery however  denies pain, blood, mucus in stool monitor  18. 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 S/P mitral valve repair Continue coumadin, Continue cardio follow up  20. Hx of CABG Control blood pressure, cholesterol, glucose, increase exercise.  Continue cardio follow up  21. Long term current use of anticoagulant therapy Follows with coumadin clinic, states it is good, on q 6 week schedule Denies blood in stool, urine, bruising, nose bleeds.   22. Ejection fraction < 50% monitor  23. Pacemaker-Medtronic Continue to follow up cardio  24. Other iron deficiency anemias Monitor CBC  25. Depression screen negative  26. At low risk for fall Low risk  27. Encounter for general adult medical examination with abnormal findings  Over 30 minutes of exam, counseling, chart review, and critical decision making was performed  Plan:   During the course of the visit the patient was educated and counseled about appropriate screening and preventive services including:    Pneumococcal vaccine   Influenza vaccine  Td vaccine  Screening electrocardiogram  Screening mammography  Bone densitometry screening  Colorectal cancer screening  Diabetes screening  Glaucoma screening  Nutrition counseling   Advanced directives: given info/requested  Conditions/risks identified: Diabetes is not at goal, ACE/ARB therapy: Yes. Urinary Incontinence is not an issue: discussed non pharmacology and pharmacology options.  Fall risk: low- discussed PT, home fall assessment, medications.    Subjective:   Anita Duke is a 74 y.o. female who presents for Medicare Annual Wellness Visit and 3 month follow up on hypertension, diabetes, hyperlipidemia, vitamin D def.  Date of last medicare wellness visit is unknown.   Her blood pressure has been controlled at home, today their BP is BP: 132/72 mmHg She does workout. She denies chest pain, shortness of breath,  dizziness.  She has has chronic Afib and is on coumadin,  follows with coumadin clinic, has pacemaker for SSS, follows with Dr. Ron Parker, sees him next week. She has a history of CAD s/p CABG and CHF. Denies orthopnea, PND, edema.  She is on cholesterol medication, lipitor 40mg  and denies myalgias. Her cholesterol is at goal. The cholesterol last visit was:   Lab Results  Component Value Date   CHOL 110 08/25/2014   HDL 44 08/25/2014   LDLCALC 37 08/25/2014   TRIG 143 08/25/2014   CHOLHDL 2.5 08/25/2014   She has been working on diet and exercise for Diabetes with other circulatory complications, she is not on bASA, taken off by her cardiologist, she is on ACE/ARB, and denies paresthesia of the feet, polydipsia, polyuria and visual disturbances. Last A1C was:  Lab Results  Component Value Date   HGBA1C 7.1* 08/25/2014  Patient is on Vitamin D supplement. Lab Results  Component Value Date   VD25OH 45 08/25/2014   She is on effexor for anxiety and states it helps, she has had extra stress, husband with recurrent cancer.   Review of Systems  Constitutional: Negative.   HENT: Negative.   Eyes: Negative.   Respiratory: Negative.   Cardiovascular: Negative.   Gastrointestinal: Positive for diarrhea (since surgery, takes imodium which helps). Negative for heartburn, nausea, vomiting, abdominal pain, constipation, blood in stool and melena.  Genitourinary: Negative.   Musculoskeletal: Positive for joint pain (bilateral knees). Negative for myalgias, back pain, falls and neck pain.  Skin: Negative.   Neurological: Negative.   Psychiatric/Behavioral: Negative.  Negative for depression and memory loss. The patient is not nervous/anxious and does not have insomnia.     Medication Review Current Outpatient Prescriptions on File Prior to Visit  Medication Sig Dispense Refill  . acetaminophen (TYLENOL) 500 MG tablet Take 1,000 mg by mouth every 6 (six) hours as needed. For pain    . atorvastatin  (LIPITOR) 40 MG tablet Take 1 tablet by mouth  daily 90 tablet 0  . calcium carbonate (OS-CAL) 600 MG TABS Take 600 mg by mouth daily.     . carvedilol (COREG) 6.25 MG tablet Take 1 tablet by mouth  twice a day with meals 180 tablet 0  . cholecalciferol (VITAMIN D) 1000 UNITS tablet Take 4,000 Units by mouth daily.    . fish oil-omega-3 fatty acids 1000 MG capsule Take 1 g by mouth 2 (two) times daily.     Marland Kitchen FLUZONE HIGH-DOSE 0.5 ML SUSY   0  . furosemide (LASIX) 40 MG tablet Take 0.5 tablets (20 mg total) by mouth daily. 90 tablet 3  . loperamide (IMODIUM) 2 MG capsule Take 1 capsule (2 mg total) by mouth daily as needed for diarrhea or loose stools. 30 capsule 10  . loratadine (CLARITIN) 10 MG tablet Take 10 mg by mouth daily.    . metFORMIN (GLUCOPHAGE XR) 500 MG 24 hr tablet Take 1 to 2 tablets 2 x day for Diabetes 360 tablet 99  . potassium chloride SA (KLOR-CON M20) 20 MEQ tablet Take 3 tablets (60 mEq total) by mouth daily. 90 tablet 3  . trandolapril (MAVIK) 4 MG tablet Take 1 tablet by mouth two  times daily for blood  pressure 180 tablet 3  . venlafaxine XR (EFFEXOR-XR) 37.5 MG 24 hr capsule Take 1 capsule by mouth  every evening 90 capsule 3  . warfarin (COUMADIN) 2.5 MG tablet Take as directed by  anticoagulation clinic 100 tablet 1   No current facility-administered medications on file prior to visit.  Current Problems (verified) Patient Active Problem List   Diagnosis Date Noted  . CAD (coronary artery disease) 05/28/2014  . Vitamin D deficiency 02/20/2014  . Medication management 02/20/2014  . Long term (current) use of anticoagulants 11/24/2013  . Chronic systolic dysfunction of left ventricle 05/21/2013  . 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 Anemia   . Acquired complete AV block   . Sick sinus syndrome   . Pacemaker-Medtronic 05/20/2012  . Allergic rhinitis 04/25/2012  . Mild intermittent asthma 02/16/2011  . Ejection fraction < 50%   . Atrial  fibrillation   . Mitral regurgitation   . Right ventricular dysfunction   . Mitral stenosis   . S/P mitral valve repair   . Hx of CABG   . Diabetes mellitus, type II 03/02/2009  . Hyperlipidemia 07/07/2008  . Essential hypertension 07/07/2008  . DIVERTICULOSIS, COLON 08/04/2002    Screening Tests Immunization History  Administered Date(s) Administered  . Influenza Split 06/24/2011  . Influenza Whole 06/04/2012, 05/16/2013  . Pneumococcal Conjugate-13 08/25/2014  . Pneumococcal Polysaccharide-23 08/10/2011  . Zoster 05/23/2006    Preventative care: Last colonoscopy: 01/2014 EGD 2012 Last mammogram: 01/2015 Last pap smear/pelvic exam: remote declines another DEXA:remote PFTs 2012 CT chest 2014 CT AB 2014 Echo 05/2014  Prior vaccinations: TD or Tdap: allergy, declines Influenza: 2015  Pneumococcal: 2012 Prevnar13: 2015 Shingles/Zostavax: 2007  Names of Other Physician/Practitioners you currently use: 1. [facility]- here for primary care 2. Dr. Eben Burow, eye doctor, last visit Dec 2015 3. Dr. Lin Landsman, dentist, last visit Dec 2015 Patient Care Team: Unk Pinto, MD as PCP - General (Internal Medicine) Carlena Bjornstad, MD as Consulting Physician (Cardiology) Inda Castle, MD as Consulting Physician (Gastroenterology)  Past Surgical History  Procedure Laterality Date  . Mitral valve repair  2001    "anterior and posterior leaflets" (12/04/2012)  . Maze  2001    w/ MVR & CABG  . Cardioversion  02/08/2012    Procedure: CARDIOVERSION;  Surgeon: Carlena Bjornstad, MD;  Location: [place];  Service: Cardiovascular;  Laterality: N/A;  . Cardioversion  02/29/2012    Procedure: CARDIOVERSION;  Surgeon: Carlena Bjornstad, MD;  Location: [place];  Service: Cardiovascular;  Laterality: N/A;  . Cardioversion  05/15/2012    Procedure: CARDIOVERSION;  Surgeon: Carlena Bjornstad, MD;  Location: [facility];  Service: Cardiovascular;  Laterality: N/A;  .  Tonsillectomy and adenoidectomy  ~ 1950  . 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 Av node ablation  05/28/12  . Cardiac catheterization  05/2012  . Laparoscopic right hemi colectomy  12/03/2012    Procedure: LAPAROSCOPIC RIGHT HEMI COLECTOMY;  Surgeon: Stark Klein, MD;  Location: [place];  Service: General;;  . Umbilical hernia repair N/A 12/03/2012    Procedure: HERNIA REPAIR UMBILICAL ADULT;  Surgeon: Stark Klein, MD;  Location: [place];  Service: General;  Laterality: N/A;  . Diagnostic laparoscopic liver biopsy Left 12/03/2012    Procedure: DIAGNOSTIC LAPAROSCOPIC LIVER BIOPSY;  Surgeon: Stark Klein, MD;  Location: [place];  Service: General;  Laterality: Left;  . Coronary artery bypass graft  2001    CABG X1 "at time of mitral valve repair" (12/04/2012  . Breast lumpectomy Bilateral 1990's  . Cholecystectomy  1995  . Abdominal hysterectomy  ?2002  . Bi-ventricular pacemaker upgrade  11/04/2013    upgrade of previously implanted dual chamber pacemaker to STJ  Quadra Allure CRTP by Dr Rayann Heman  . Permanent pacemaker insertion N/A 05/17/2012    Procedure: PERMANENT PACEMAKER INSERTION;  Surgeon: Thompson Grayer, MD;  Location[facility] C[facility] LAB;  Service: Cardiovascular;  Laterality: N/A;  . Av node ablation N/A 05/28/2012    Procedure: AV NODE ABLATION;  Surgeon: Thompson Grayer, MD;  Location: [facility] CATH LAB;  Service: Cardiovascular;  Laterality: N/A;  . Bi-ventricular pacemaker upgrade N/A 11/04/2013    Procedure: BI-VENTRICULAR PACEMAKER UPGRADE;  Surgeon: Coralyn Mark, MD;  Location: [place] CATH LAB;  Service: Cardiovascular;  Laterality: N/A;   Family History  Problem Relation Age of Onset  . Pancreatic cancer Maternal Grandfather   . Heart disease Father   . Diabetes Father   . Heart disease Sister     x 2  . Diabetes Mother   . Kidney disease Mother   . Colon cancer Neg Hx   . Diabetes Paternal Grandfather   . Diabetes Paternal Grandmother   . Heart  disease Paternal Uncle     x 6  . Prostate cancer Paternal Uncle   . Colon polyps Neg Hx    History  Substance Use Topics  . Smoking status: Never Smoker   . Smokeless tobacco: Never Used  . Alcohol Use: No    MEDICARE WELLNESS OBJECTIVES: Tobacco use: She does not smoke.  Patient is not a former smoker. Alcohol Current alcohol use: none Caffeine Current caffeine use: coffee 1 /day Osteoporosis: postmenopausal estrogen deficiency and dietary calcium and/or vitamin D deficiency, History of fracture in the past year: no Diet: in general, a "healthy" diet   Physical activity: walking Depression/mood screen:  Yes - No Depression Hearing: normal Visual acuity: normal,  does perform annual eye exam  ADLs: self care Fall risk: Low Risk Home safety: good Cognitive Testing  Alert? Yes  Normal Appearance?Yes  Oriented to person? Yes  Place? Yes   Time? Yes  Recall of three objects?  Yes  Can perform simple calculations? Yes  Displays appropriate judgment?Yes  Can read the correct time from a watch face?Yes EOL planning: Yes   Objective:   Blood pressure 132/72, pulse 72, temperature 97.9 F (36.6 C), resp. rate 16, height 5' 2.5" (1.588 m), weight 189 lb (85.73 kg). Body mass index is 34 kg/(m^2).  HEENT: normocephalic, sclerae anicteric, TMs pearly, nares patent, no discharge or erythema, pharynx normal Oral cavity: MMM, no lesions Neck: supple, no lymphadenopathy, no thyromegaly, no masses Heart: irreg irreg distant heart sounds, normal S1, S2, no murmurs, pacemaker site Lungs: CTA bilaterally, no wheezes, rhonchi, or rales Abdomen: +bs, soft, obese, non tender, non distended, no masses, no hepatomegaly, no splenomegaly Musculoskeletal: nontender, no swelling, no obvious deformity Extremities: no edema, no cyanosis, no clubbing, bilateral toes brittle, long, and scaly feet.  Pulses: 2+ symmetric, upper and lower extremities, normal cap refill Neurological: alert, oriented x  3, CN2-12 intact, strength normal upper extremities and lower extremities, sensation normal throughout, DTRs 2+ throughout, no cerebellar signs, gait normal Psychiatric: normal affect, behavior normal, pleasant  Breast: defer Gyn: defer Rectal: defer   Medicare Attestation I have personally reviewed:  The patient's medical and social history Their use of alcohol, tobacco or illicit drugs Their current medications and supplements The patient's functional ability including ADLs,fall risks, home safety risks, cognitive, and hearing and visual impairment Diet and physical activities Evidence for depression or mood disorders  The patient's weight, height, BMI, and visual acuity have been recorded in the chart.  I have made referrals,  counseling, and provided education to the patient based on review of the above and I have provided the patient with a written personalized care plan for preventive services.     Vicie Mutters, PA-C   12/02/2014

## 2014-12-02 NOTE — Patient Instructions (Signed)
Diabetes is a very complicated disease...lets simplify it.  An easy way to look at it to understand the complications is if you think of the extra sugar floating in your blood stream as glass shards floating through your blood stream.    Diabetes affects your small vessels first: 1) The glass shards (sugar) scraps down the tiny blood vessels in your eyes and lead to diabetic retinopathy, the leading cause of blindness in the [place]. Diabetes is the leading cause of newly diagnosed adult (20 to 74 years of age) blindness in the [place].  2) The glass shards scratches down the tiny vessels of your legs leading to nerve damage called neuropathy and can lead to amputations of your feet. More than 60% of all non-traumatic amputations of lower limbs occur in people with diabetes.  3) Over time the small vessels in your brain are shredded and closed off, individually this does not cause any problems but over a long period of time many of the small vessels being blocked can lead to Vascular Dementia.   4) Your kidney's are a filter system and have a "net" that keeps certain things in the body and lets bad things out. Sugar shreds this net and leads to kidney damage and eventually failure. Decreasing the sugar that is destroying the net and certain blood pressure medications can help stop or decrease progression of kidney disease. Diabetes was the primary cause of kidney failure in 44 percent of all new cases in 2011.  5) Diabetes also destroys the small vessels in your penis that lead to erectile dysfunction. Eventually the vessels are so damaged that you may not be responsive to cialis or viagra.   Diabetes and your large vessels: Your larger vessels consist of your coronary arteries in your heart and the carotid vessels to your brain. Diabetes or even increased sugars put you at 300% increased risk of heart attack and stroke and this is why.. The sugar scrapes down your large blood vessels and your body  sees this as an internal injury and tries to repair itself. Just like you get a scab on your skin, your platelets will stick to the blood vessel wall trying to heal it. This is why we have diabetics on low dose aspirin daily, this prevents the platelets from sticking and can prevent plaque formation. In addition, your body takes cholesterol and tries to shove it into the open wound. This is why we want your LDL, or bad cholesterol, below 70.   The combination of platelets and cholesterol over 5-10 years forms plaque that can break off and cause a heart attack or stroke.   PLEASE REMEMBER:  Diabetes is preventable! Up to 85 percent of complications and morbidities among individuals with type 2 diabetes can be prevented, delayed, or effectively treated and minimized with regular visits to a health professional, appropriate monitoring and medication, and a healthy diet and lifestyle.  Recommendations For Diabetic/Prediabetic Patients:   -  Take medications as prescribed  -  Recommend Dr Joel Fuhrman's book "The End of Diabetes "  And "The End of Dieting"- Can get at  www.Amazon.com and encourage also get the Audio CD book  - AVOID Animal products, ie. Meat - red/white, Poultry and Dairy/especially cheese - Exercise at least 5 times a week for 30 minutes or preferably daily.  - No Smoking - Drink less than 2 drinks a day.  - Monitor your feet for sores - Have yearly Eye Exams - Recommend annual Flu vaccine  -   Recommend Pneumovax and Prevnar vaccines - Shingles Vaccine (Zostavax) if over 89 y.o.  Goals:   - BMI less than 24 - Fasting sugar less than 130 or less than 150 if tapering medicines to lose weight  - Systolic BP less than 951  - Diastolic BP less than 80 - Bad LDL Cholesterol less than 70 - Triglycerides less than 150   Your A1C is a measure of your sugar over the past 3 months and is not affected by what you have eaten over the past few days. Diabetes increases your chances of  stroke and heart attack over 300 % and is the leading cause of blindness and kidney failure in the [place]. Please make sure you decrease bad carbs like white bread, white rice, potatoes, corn, soft drinks, pasta, cereals, refined sugars, sweet tea, dried fruits, and fruit juice. Good carbs are okay to eat in moderation like sweet potatoes, brown rice, whole grain pasta/bread, most fruit (except dried fruit) and you can eat as many veggies as you want.   Greater than 6.5 is considered diabetic. Between 6.4 and 5.7 is prediabetic If your A1C is less than 5.7 you are NOT diabetic.  Targets for Glucose Readings: Time of Check Target for patients WITHOUT Diabetes Target for DIABETICS  Before Meals Less than 100  less than 150  Two hours after meals Less than 200  Less than 250    Before you even begin to attack a weight-loss plan, it pays to remember this: You are not fat. You have fat. Losing weight isn't about blame or shame; it's simply another achievement to accomplish. Dieting is like any other skill-you have to buckle down and work at it. As long as you act in a smart, reasonable way, you'll ultimately get where you want to be. Here are some weight loss pearls for you.  1. It's Not a Diet. It's a Lifestyle Thinking of a diet as something you're on and suffering through only for the short term doesn't work. To shed weight and keep it off, you need to make permanent changes to the way you eat. It's OK to indulge occasionally, of course, but if you cut calories temporarily and then revert to your old way of eating, you'll gain back the weight quicker than you can say yo-yo. Use it to lose it. Research shows that one of the best predictors of long-term weight loss is how many pounds you drop in the first month. For that reason, nutritionists often suggest being stricter for the first two weeks of your new eating strategy to build momentum. Cut out added sugar and alcohol and avoid unrefined carbs.  After that, figure out how you can reincorporate them in a way that's healthy and maintainable.  2. There's a Right Way to Exercise Working out burns calories and fat and boosts your metabolism by building muscle. But those trying to lose weight are notorious for overestimating the number of calories they burn and underestimating the amount they take in. Unfortunately, your system is biologically programmed to hold on to extra pounds and that means when you start exercising, your body senses the deficit and ramps up its hunger signals. If you're not diligent, you'll eat everything you burn and then some. Use it to lose it. Cardio gets all the exercise glory, but strength and interval training are the real heroes. They help you build lean muscle, which in turn increases your metabolism and calorie-burning ability 3. Don't Overreact to Mild Hunger Some people  have a hard time losing weight because of hunger anxiety. To them, being hungry is bad-something to be avoided at all costs-so they carry snacks with them and eat when they don't need to. Others eat because they're stressed out or bored. While you never want to get to the point of being ravenous (that's when bingeing is likely to happen), a hunger pang, a craving, or the fact that it's 3:00 p.m. should not send you racing for the vending machine or obsessing about the energy bar in your purse. Ideally, you should put off eating until your stomach is growling and it's difficult to concentrate.  Use it to lose it. When you feel the urge to eat, use the HALT method. Ask yourself, Am I really hungry? Or am I angry or anxious, lonely or bored, or tired? If you're still not certain, try the apple test. If you're truly hungry, an apple should seem delicious; if it doesn't, something else is going on. Or you can try drinking water and making yourself busy, if you are still hungry try a healthy snack.  4. Not All Calories Are Created Equal The mechanics of weight  loss are pretty simple: Take in fewer calories than you use for energy. But the kind of food you eat makes all the difference. Processed food that's high in saturated fat and refined starch or sugar can cause inflammation that disrupts the hormone signals that tell your brain you're full. The result: You eat a lot more.  Use it to lose it. Clean up your diet. Swap in whole, unprocessed foods, including vegetables, lean protein, and healthy fats that will fill you up and give you the biggest nutritional bang for your calorie buck. In a few weeks, as your brain starts receiving regular hunger and fullness signals once again, you'll notice that you feel less hungry overall and naturally start cutting back on the amount you eat.  5. Protein, Produce, and Plant-Based Fats Are Your Weight-Loss Trinity Here's why eating the three Ps regularly will help you drop pounds. Protein fills you up. You need it to build lean muscle, which keeps your metabolism humming so that you can torch more fat. People in a weight-loss program who ate double the recommended daily allowance for protein (about 110 grams for a 150-pound woman) lost 70 percent of their weight from fat, while people who ate the RDA lost only about 40 percent, one study found. Produce is packed with filling fiber. "It's very difficult to consume too many calories if you're eating a lot of vegetables. Example: Three cups of broccoli is a lot of food, yet only 93 calories. (Fruit is another story. It can be easy to overeat and can contain a lot of calories from sugar, so be sure to monitor your intake.) Plant-based fats like olive oil and those in avocados and nuts are healthy and extra satiating.  Use it to lose it. Aim to incorporate each of the three Ps into every meal and snack. People who eat protein throughout the day are able to keep weight off, according to a study in the [place] [employer]. In addition to meat, poultry and seafood,  good sources are beans, lentils, eggs, tofu, and yogurt. As for fat, keep portion sizes in check by measuring out salad dressing, oil, and nut butters (shoot for one to two tablespoons). Finally, eat veggies or a little fruit at every meal. People who did that consumed 308 fewer calories but didn't feel any hungrier  than when they didn't eat more produce.  7. How You Eat Is As Important As What You Eat In order for your brain to register that you're full, you need to focus on what you're eating. Sit down whenever you eat, preferably at a table. Turn off the TV or computer, put down your phone, and look at your food. Smell it. Chew slowly, and don't put another bite on your fork until you swallow. When women ate lunch this attentively, they consumed 30 percent less when snacking later than those who listened to an audiobook at lunchtime, according to a study in the [place] of Nutrition. 8. Weighing Yourself Really Works The scale provides the best evidence about whether your efforts are paying off. Seeing the numbers tick up or down or stagnate is motivation to keep going-or to rethink your approach. A 2015 study at [facility] found that daily weigh-ins helped people lose more weight, keep it off, and maintain that loss, even after two years. Use it to lose it. Step on the scale at the same time every day for the best results. If your weight shoots up several pounds from one weigh-in to the next, don't freak out. Eating a lot of salt the night before or having your period is the likely culprit. The number should return to normal in a day or two. It's a steady climb that you need to do something about. 9. Too Much Stress and Too Little Sleep Are Your Enemies When you're tired and frazzled, your body cranks up the production of cortisol, the stress hormone that can cause carb cravings. Not getting enough sleep also boosts your levels of ghrelin, a hormone associated with hunger, while suppressing  leptin, a hormone that signals fullness and satiety. People on a diet who slept only five and a half hours a night for two weeks lost 55 percent less fat and were hungrier than those who slept eight and a half hours, according to a study in the [place]. Use it to lose it. Prioritize sleep, aiming for seven hours or more a night, which research shows helps lower stress. And make sure you're getting quality zzz's. If a snoring spouse or a fidgety cat wakes you up frequently throughout the night, you may end up getting the equivalent of just four hours of sleep, according to a study from [facility]. Keep pets out of the bedroom, and use a white-noise app to drown out snoring. 10. You Will Hit a plateau-And You Can Bust Through It As you slim down, your body releases much less leptin, the fullness hormone.  If you're not strength training, start right now. Building muscle can raise your metabolism to help you overcome a plateau. To keep your body challenged and burning calories, incorporate new moves and more intense intervals into your workouts or add another sweat session to your weekly routine. Alternatively, cut an extra 100 calories or so a day from your diet. Now that you've lost weight, your body simply doesn't need as much fuel.

## 2014-12-03 LAB — VITAMIN D 25 HYDROXY (VIT D DEFICIENCY, FRACTURES): Vit D, 25-Hydroxy: 50 ng/mL (ref 30–100)

## 2014-12-07 DIAGNOSIS — C182 Malignant neoplasm of ascending colon: Secondary | ICD-10-CM | POA: Diagnosis not present

## 2014-12-08 ENCOUNTER — Telehealth: Payer: Self-pay | Admitting: Cardiology

## 2014-12-08 NOTE — Telephone Encounter (Signed)
Pt enrolled in [facility] clinic. Next transmission 12-28-14

## 2014-12-08 NOTE — Telephone Encounter (Signed)
-----   Message from Thompson Grayer, MD sent at 12/07/2014 10:37 PM EDT ----- Please enroll in [facility] clinic with Heather and schedule

## 2014-12-09 DIAGNOSIS — C182 Malignant neoplasm of ascending colon: Secondary | ICD-10-CM | POA: Diagnosis not present

## 2014-12-10 ENCOUNTER — Encounter: Payer: Self-pay | Admitting: Cardiology

## 2014-12-11 ENCOUNTER — Ambulatory Visit (INDEPENDENT_AMBULATORY_CARE_PROVIDER_SITE_OTHER): Payer: Medicare Other | Admitting: Cardiology

## 2014-12-11 ENCOUNTER — Encounter: Payer: Self-pay | Admitting: Cardiology

## 2014-12-11 VITALS — BP 115/68 | HR 70 | Ht 62.5 in | Wt 182.0 lb

## 2014-12-11 DIAGNOSIS — I519 Heart disease, unspecified: Secondary | ICD-10-CM | POA: Diagnosis not present

## 2014-12-11 DIAGNOSIS — R9430 Abnormal result of cardiovascular function study, unspecified: Secondary | ICD-10-CM

## 2014-12-11 DIAGNOSIS — R0602 Shortness of breath: Secondary | ICD-10-CM

## 2014-12-11 DIAGNOSIS — R0989 Other specified symptoms and signs involving the circulatory and respiratory systems: Secondary | ICD-10-CM | POA: Diagnosis not present

## 2014-12-11 NOTE — Patient Instructions (Signed)
Your physician recommends that you continue on your current medications as directed. Please refer to the Current Medication list given to you today.   Your physician has requested that you have an echocardiogram. Echocardiography is a painless test that uses sound waves to create images of your heart. It provides your doctor with information about the size and shape of your heart and how well your heart's chambers and valves are working. This procedure takes approximately one hour. There are no restrictions for this procedure.    Your physician wants you to follow-up in: 5 months with Dr. Ron Parker. You will receive a reminder letter in the mail two months in advance. If you don't receive a letter, please call our office to schedule the follow-up appointment.

## 2014-12-11 NOTE — Assessment & Plan Note (Signed)
Histologically shortness of breath has been the symptom that has bothered her the most. Most recently she has not had any significant shortness of breath.

## 2014-12-11 NOTE — Assessment & Plan Note (Signed)
Clinically her volume status is stable. No change in therapy today.

## 2014-12-11 NOTE — Assessment & Plan Note (Signed)
The patient has decreased left ventricular ejection fraction. It has been greater than 6 months since her last study. We need to reevaluate her left ventricular function so that I can decide if I need to adjust her medicines further.

## 2014-12-11 NOTE — Progress Notes (Signed)
Cardiology Office Note   Date:  12/11/2014   ID:  Anita, Duke 1941-02-19, MRN [id number]  PCP:  Alesia Richards, MD  Cardiologist:  Dola Argyle, MD   Chief Complaint  Patient presents with  . Appointment    Follow-up arrhythmias      History of Present Illness: Anita Duke is a 74 y.o. female who presents today to follow-up her atrial arrhythmias and shortness of breath. She is doing very well. She was upgraded to a biventricular pacemaker in March, 2015. Two-dimensional echo after that did not show significant improvement in ejection fraction. However, she definitely feels better.    Past Medical History  Diagnosis Date  . Mitral regurgitation     Treated with repair, atrial appendage was removed or tied off at surgery.. this was proven by TEE February, 2011  . Pulmonary HTN     55 mmHg, echo, February, 2011 / no mention of pulmonary hypertension echo, June, 2011  . Right ventricular dysfunction     Mild to moderate, echo, February, 2011 / normalized echo, June, 2011  . TR (tricuspid regurgitation)     Moderate, echo, February, 2011 / trivial, echo, June, 2011  . Mitral stenosis     Mild, February, 2011, post mitral valve repair / mild, echo, June, 2011  . CAD (coronary artery disease)     LIMA to the LAD at time of mitral valve repair / LIMA atretic,, February, 2011  . Ejection fraction < 50%     EF 30% echo and TEE diagnosis February, 2011, possibly rate related / no contraction by catheter, bradycardia so carvedilol cannot be used / EF 55-60% echo, June, 2011 / EF 50%, echo, February, 2012  . Hemorrhoids, internal   . Diverticulosis of colon   . Colon polyp, hyperplastic   . H/O amiodarone therapy     Satrted 02/12/2012  . High cholesterol   . Asthma   . COPD (chronic obstructive pulmonary disease)   . HTN (hypertension)     takes Mavik daily  . Myocardial infarction 04/2012  ?  . CHF (congestive heart failure) 2011    Mild in hospital February,  2011;takes Lasix daily  . Seasonal allergies     takes Claritin daily  . Type II diabetes mellitus     takes Metformin daily  . Cataracts, bilateral     immature  . Depression     takes Effexor daily  . Atrial fibrillation     Treated with maze procedure / recurrent atrial fibrillation February, 2011... TEE cardioversion October 19, 2009; s/p AVN ablatin  . QT prolongation     Tikosyn and Effexor. QT prolonged October 13, 2011, peak is in dose reduced from 500  to -250 twice a day  . Anemia     Hemoglobin 10.4, December, 2013  . GERD (gastroesophageal reflux disease)   . Arthritis     "not bad; little in my hands; some in my knees" (11/04/2013)  . Cecal cancer 2014    Past Surgical History  Procedure Laterality Date  . Mitral valve repair  2001    "anterior and posterior leaflets" (12/04/2012)  . Maze  2001    w/ MVR & CABG  . Cardioversion  02/08/2012    Procedure: CARDIOVERSION;  Surgeon: Carlena Bjornstad, MD;  Location: [place];  Service: Cardiovascular;  Laterality: N/A;  . Cardioversion  02/29/2012    Procedure: CARDIOVERSION;  Surgeon: Carlena Bjornstad, MD;  Location: [place];  Service: Cardiovascular;  Laterality: N/A;  . Cardioversion  05/15/2012    Procedure: CARDIOVERSION;  Surgeon: Carlena Bjornstad, MD;  Location: [facility] ENDOSCOPY;  Service: Cardiovascular;  Laterality: N/A;  . Tonsillectomy and adenoidectomy  ~ 1950  . 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 Av node ablation  05/28/12  . Cardiac catheterization  05/2012  . Laparoscopic right hemi colectomy  12/03/2012    Procedure: LAPAROSCOPIC RIGHT HEMI COLECTOMY;  Surgeon: Stark Klein, MD;  Location: [place];  Service: General;;  . Umbilical hernia repair N/A 12/03/2012    Procedure: HERNIA REPAIR UMBILICAL ADULT;  Surgeon: Stark Klein, MD;  Location: [place];  Service: General;  Laterality: N/A;  . Diagnostic laparoscopic liver biopsy Left 12/03/2012    Procedure: DIAGNOSTIC  LAPAROSCOPIC LIVER BIOPSY;  Surgeon: Stark Klein, MD;  Location: [place];  Service: General;  Laterality: Left;  . Coronary artery bypass graft  2001    CABG X1 "at time of mitral valve repair" (12/04/2012  . Breast lumpectomy Bilateral 1990's  . Cholecystectomy  1995  . Abdominal hysterectomy  ?2002  . Bi-ventricular pacemaker upgrade  11/04/2013    upgrade of previously implanted dual chamber pacemaker to STJ Quadra Allure CRTP by Dr Rayann Heman  . Permanent pacemaker insertion N/A 05/17/2012    Procedure: PERMANENT PACEMAKER INSERTION;  Surgeon: Thompson Grayer, MD;  Location: [facility] CATH LAB;  Service: Cardiovascular;  Laterality: N/A;  . Av node ablation N/A 05/28/2012    Procedure: AV NODE ABLATION;  Surgeon: Thompson Grayer, MD;  Location: [facility] CATH LAB;  Service: Cardiovascular;  Laterality: N/A;  . Bi-ventricular pacemaker upgrade N/A 11/04/2013    Procedure: BI-VENTRICULAR PACEMAKER UPGRADE;  Surgeon: Coralyn Mark, MD;  Location: [place] CATH LAB;  Service: Cardiovascular;  Laterality: N/A;    Patient Active Problem List   Diagnosis Date Noted  . Obesity 12/02/2014  . CAD (coronary artery disease) 05/28/2014  . Vitamin D deficiency 02/20/2014  . Medication management 02/20/2014  . Long term current use of anticoagulant therapy 11/24/2013  . Chronic systolic dysfunction of left ventricle 05/21/2013  . 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 Anemia   . Acquired complete AV block   . Sick sinus syndrome   . Pacemaker-Medtronic 05/20/2012  . Allergic rhinitis 04/25/2012  . Mild intermittent asthma 02/16/2011  . Ejection fraction < 50%   . Atrial fibrillation   . Mitral regurgitation   . Right ventricular dysfunction   . Mitral stenosis   . S/P mitral valve repair   . Hx of CABG   . Diabetes mellitus, type II 03/02/2009  . Hyperlipidemia 07/07/2008  . Essential hypertension 07/07/2008  . Diverticulosis of large intestine 08/04/2002      Current Outpatient Prescriptions    Medication Sig Dispense Refill  . acetaminophen (TYLENOL) 500 MG tablet Take 1,000 mg by mouth every 6 (six) hours as needed. For pain    . atorvastatin (LIPITOR) 40 MG tablet Take 1 tablet by mouth  daily 90 tablet 0  . calcium carbonate (OS-CAL) 600 MG TABS Take 600 mg by mouth daily.     . carvedilol (COREG) 6.25 MG tablet Take 1 tablet by mouth  twice a day with meals 180 tablet 0  . cholecalciferol (VITAMIN D) 1000 UNITS tablet Take 4,000 Units by mouth daily.    . fish oil-omega-3 fatty acids 1000 MG capsule Take 1 g by mouth 2 (two) times daily.     Marland Kitchen  furosemide (LASIX) 40 MG tablet Take 0.5 tablets (20 mg total) by mouth daily. 90 tablet 3  . loperamide (IMODIUM) 2 MG capsule Take 1 capsule (2 mg total) by mouth daily as needed for diarrhea or loose stools. 30 capsule 10  . loratadine (CLARITIN) 10 MG tablet Take 10 mg by mouth daily.    . metFORMIN (GLUCOPHAGE XR) 500 MG 24 hr tablet Take 1 to 2 tablets 2 x day for Diabetes (Patient taking differently: Take 500 mg by mouth 2 (two) times daily. Take 1 to 2 tablets 2 x day for Diabetes) 360 tablet 99  . potassium chloride SA (KLOR-CON M20) 20 MEQ tablet Take 3 tablets (60 mEq total) by mouth daily. 90 tablet 3  . trandolapril (MAVIK) 4 MG tablet Take 1 tablet by mouth two  times daily for blood  pressure 180 tablet 3  . venlafaxine XR (EFFEXOR-XR) 37.5 MG 24 hr capsule Take 1 capsule by mouth  every evening 90 capsule 3  . warfarin (COUMADIN) 2.5 MG tablet Take as directed by  anticoagulation clinic 100 tablet 1   No current facility-administered medications for this visit.    Allergies:   Demerol; Morphine; and Tetanus toxoid    Social History:  The patient  reports that she has never smoked. She has never used smokeless tobacco. She reports that she does not drink alcohol or use illicit drugs.   Family History:  The patient's family history includes Diabetes in her father, mother, paternal grandfather, and paternal grandmother;  Heart disease in her father, paternal uncle, and sister; Kidney disease in her mother; Pancreatic cancer in her maternal grandfather; Prostate cancer in her paternal uncle. There is no history of Colon cancer or Colon polyps.    ROS:  Please see the history of present illness.     Patient denies fever, chills, headache, sweats, rash, change in vision, change in hearing, chest pain, cough, nausea or vomiting, urinary symptoms. All other systems are reviewed and are negative.   PHYSICAL EXAM: VS:  BP 115/68 mmHg  Pulse 70  Ht 5' 2.5" (1.588 m)  Wt 182 lb (82.555 kg)  BMI 32.74 kg/m2  SpO2 98% , patient is overweight but stable. She is oriented to person time and place. Affect is normal. Head is atraumatic. Sclera and conjunctiva are normal. There is no jugular venous distention. Lungs are clear. Respiratory effort is not labored. Cardiac exam reveals S1 and S2. There are no clicks or significant murmurs. The abdomen is soft. There is no peripheral edema. There are no musculoskeletal deformities. There are no skin rashes. Neurologic is grossly intact.  EKG:  EKG is not done today.    Recent Labs: 12/02/2014: ALT 27; BUN 13; Creatinine 0.55; Hemoglobin 14.0; Magnesium 1.7; Platelets 189; Potassium 4.3; Sodium 141; TSH 0.533    Lipid Panel    Component Value Date/Time   CHOL 138 12/02/2014 1130   TRIG 171* 12/02/2014 1130   HDL 44* 12/02/2014 1130   CHOLHDL 3.1 12/02/2014 1130   VLDL 34 12/02/2014 1130   LDLCALC 60 12/02/2014 1130      Wt Readings from Last 3 Encounters:  12/11/14 182 lb (82.555 kg)  12/02/14 189 lb (85.73 kg)  08/25/14 179 lb 3.2 oz (81.285 kg)      Current medicines are reviewed  The patient understands her medications.     ASSESSMENT AND PLAN:

## 2014-12-14 ENCOUNTER — Encounter: Payer: Self-pay | Admitting: Internal Medicine

## 2014-12-24 ENCOUNTER — Ambulatory Visit (HOSPITAL_COMMUNITY): Payer: Medicare Other | Attending: Cardiology | Admitting: Radiology

## 2014-12-24 ENCOUNTER — Ambulatory Visit (INDEPENDENT_AMBULATORY_CARE_PROVIDER_SITE_OTHER): Payer: Medicare Other | Admitting: *Deleted

## 2014-12-24 ENCOUNTER — Ambulatory Visit
Admission: RE | Admit: 2014-12-24 | Discharge: 2014-12-24 | Disposition: A | Discharge status: Home / Self Care | Payer: Medicare Other | Source: Ambulatory Visit

## 2014-12-24 ENCOUNTER — Encounter: Payer: Self-pay | Admitting: Cardiology

## 2014-12-24 ENCOUNTER — Ambulatory Visit: Payer: Self-pay

## 2014-12-24 DIAGNOSIS — I10 Essential (primary) hypertension: Secondary | ICD-10-CM | POA: Diagnosis not present

## 2014-12-24 DIAGNOSIS — E119 Type 2 diabetes mellitus without complications: Secondary | ICD-10-CM | POA: Insufficient documentation

## 2014-12-24 DIAGNOSIS — I519 Heart disease, unspecified: Secondary | ICD-10-CM

## 2014-12-24 DIAGNOSIS — I059 Rheumatic mitral valve disease, unspecified: Secondary | ICD-10-CM | POA: Diagnosis not present

## 2014-12-24 DIAGNOSIS — Z1231 Encounter for screening mammogram for malignant neoplasm of breast: Secondary | ICD-10-CM | POA: Diagnosis not present

## 2014-12-24 DIAGNOSIS — Z5181 Encounter for therapeutic drug level monitoring: Secondary | ICD-10-CM | POA: Diagnosis not present

## 2014-12-24 DIAGNOSIS — I4891 Unspecified atrial fibrillation: Secondary | ICD-10-CM | POA: Diagnosis not present

## 2014-12-24 LAB — POCT INR: INR: 1.8

## 2014-12-24 NOTE — Progress Notes (Signed)
Echocardiogram performed.  

## 2014-12-28 ENCOUNTER — Encounter: Payer: Self-pay | Admitting: *Deleted

## 2014-12-28 ENCOUNTER — Ambulatory Visit (INDEPENDENT_AMBULATORY_CARE_PROVIDER_SITE_OTHER): Payer: Medicare Other | Admitting: *Deleted

## 2014-12-28 DIAGNOSIS — Z950 Presence of cardiac pacemaker: Secondary | ICD-10-CM | POA: Diagnosis not present

## 2014-12-28 DIAGNOSIS — I519 Heart disease, unspecified: Secondary | ICD-10-CM

## 2014-12-28 NOTE — Progress Notes (Signed)
EPIC Encounter for ICM Monitoring  Patient Name: Anita Duke is a 74 y.o. female Date: 12/28/2014 Primary Care Physican: Alesia Richards, MD Primary Cardiologist: Ron Parker Electrophysiologist: Allred Dry Weight: 179 lbs  Bi-V pacing: 99%       In the past month, have you:  1. Gained more than 2 pounds in a day or more than 5 pounds in a week? Uncertain- she does not weigh at home.   2. Had changes in your medications (with verification of current medications)? No. She will skip dose of her lasix if she is going out. She states there were a few times this month she was going out 2 or more days in a row.   3. Had more shortness of breath than is usual for you? Only occasional SOB when she misses her lasix.   4. Limited your activity because of shortness of breath? no  5. Not been able to sleep because of shortness of breath? no  6. Had increased swelling in your feet or ankles? no  7. Had symptoms of dehydration (dizziness, dry mouth, increased thirst, decreased urine output) no  8. Had changes in sodium restriction? no  9. Been compliant with medication? Yes   ICM trend:   Follow-up plan: ICM clinic phone appointment: 02/02/15. The patient has had some flucuation in her fluid readings. This is most likely due to skipping doses if she will be going out during the day. She states she forgets to take her lasix at other times. She has not had her lasix today as she was out of town picking up a rescue dog. She states she will take this when she arrives home. No changes made today.   Copy of note sent to patient's primary care physician, primary cardiologist, and device following physician.  Alvis Lemmings, RN, BSN 12/28/2014 2:46 PM

## 2015-01-01 ENCOUNTER — Telehealth: Payer: Self-pay

## 2015-01-01 NOTE — Telephone Encounter (Signed)
Please notify the patient that her echo was stable. I am pleased with the result.     ----- Message -----     From: Gretta Began     Sent: 12/29/2014 11:56 AM      To: Carlena Bjornstad, MD    The pt is advised and she verbalized understanding.

## 2015-02-02 ENCOUNTER — Ambulatory Visit (INDEPENDENT_AMBULATORY_CARE_PROVIDER_SITE_OTHER): Payer: Medicare Other | Admitting: *Deleted

## 2015-02-02 ENCOUNTER — Telehealth: Payer: Self-pay | Admitting: Cardiology

## 2015-02-02 DIAGNOSIS — Z950 Presence of cardiac pacemaker: Secondary | ICD-10-CM

## 2015-02-02 DIAGNOSIS — I519 Heart disease, unspecified: Secondary | ICD-10-CM | POA: Diagnosis not present

## 2015-02-02 NOTE — Telephone Encounter (Signed)
Spoke with pt and reminded pt of remote transmission that is due today. Pt verbalized understanding and said she would send transmission when she returns home from the beach.

## 2015-02-04 ENCOUNTER — Encounter: Payer: Self-pay | Admitting: *Deleted

## 2015-02-04 ENCOUNTER — Ambulatory Visit (INDEPENDENT_AMBULATORY_CARE_PROVIDER_SITE_OTHER): Payer: Medicare Other | Admitting: *Deleted

## 2015-02-04 DIAGNOSIS — Z5181 Encounter for therapeutic drug level monitoring: Secondary | ICD-10-CM

## 2015-02-04 DIAGNOSIS — I4891 Unspecified atrial fibrillation: Secondary | ICD-10-CM | POA: Diagnosis not present

## 2015-02-04 LAB — POCT INR: INR: 2.5

## 2015-02-04 NOTE — Progress Notes (Signed)
EPIC Encounter for ICM Monitoring  Patient Name: Anita Duke is a 74 y.o. female Date: 02/04/2015 Primary Care Physican: Anita Richards, MD Primary Cardiologist: Anita Duke Electrophysiologist: Anita Duke Dry Weight: 179 lbs  Bi-V pacing: 99%       In the past month, have you:  1. Gained more than 2 pounds in a day or more than 5 pounds in a week? Uncertain as she does not weigh regularly.  2. Had changes in your medications (with verification of current medications)? no  3. Had more shortness of breath than is usual for you? no  4. Limited your activity because of shortness of breath? no  5. Not been able to sleep because of shortness of breath? no  6. Had increased swelling in your feet or ankles? no  7. Had symptoms of dehydration (dizziness, dry mouth, increased thirst, decreased urine output) no  8. Had changes in sodium restriction? no  9. Been compliant with medication? Yes   ICM trend:   Follow-up plan: ICM clinic phone appointment: 03/25/15. The patient's corvue readings were elevated from ~ 4/30-5/8. The patient reports she was out of town during that time. She reports that she has had no change in her symptoms. She does not weight regularly. She has been active with a new puppy over the last month. No changes made today. She is due to follow up on 02/22/15 with Dr. Rayann Duke.   Copy of note sent to patient's primary care physician, primary cardiologist, and device following physician.  Alvis Lemmings, RN, BSN 02/04/2015 12:12 PM

## 2015-02-22 ENCOUNTER — Encounter: Payer: Self-pay | Admitting: Internal Medicine

## 2015-02-22 ENCOUNTER — Ambulatory Visit (INDEPENDENT_AMBULATORY_CARE_PROVIDER_SITE_OTHER): Payer: Medicare Other | Admitting: Internal Medicine

## 2015-02-22 VITALS — BP 178/68 | HR 70 | Ht 63.0 in | Wt 176.6 lb

## 2015-02-22 DIAGNOSIS — I519 Heart disease, unspecified: Secondary | ICD-10-CM

## 2015-02-22 DIAGNOSIS — I482 Chronic atrial fibrillation, unspecified: Secondary | ICD-10-CM

## 2015-02-22 DIAGNOSIS — I442 Atrioventricular block, complete: Secondary | ICD-10-CM | POA: Diagnosis not present

## 2015-02-22 LAB — CUP PACEART INCLINIC DEVICE CHECK
Brady Statistic RA Percent Paced: 0 %
Brady Statistic RV Percent Paced: 99 %
Date Time Interrogation Session: 20160620151446
Lead Channel Impedance Value: 425 Ohm
Lead Channel Impedance Value: 512.5 Ohm
Lead Channel Pacing Threshold Amplitude: 0.625 V
Lead Channel Pacing Threshold Pulse Width: 0.4 ms
Lead Channel Setting Pacing Pulse Width: 0.6 ms
Lead Channel Setting Sensing Sensitivity: 3 mV
MDC IDC MSMT BATTERY REMAINING LONGEVITY: 85.2 mo
MDC IDC MSMT BATTERY VOLTAGE: 2.99 V
MDC IDC MSMT LEADCHNL LV PACING THRESHOLD AMPLITUDE: 1.25 V
MDC IDC MSMT LEADCHNL LV PACING THRESHOLD PULSEWIDTH: 0.6 ms
MDC IDC SET LEADCHNL LV PACING AMPLITUDE: 2.25 V
MDC IDC SET LEADCHNL RV PACING AMPLITUDE: 2 V
MDC IDC SET LEADCHNL RV PACING PULSEWIDTH: 0.4 ms
Pulse Gen Model: 3242
Pulse Gen Serial Number: 7548835

## 2015-02-22 NOTE — Patient Instructions (Signed)
Medication Instructions:  Your physician recommends that you continue on your current medications as directed. Please refer to the Current Medication list given to you today.   Labwork: None ordered  Testing/Procedures: None ordered  Follow-Up: Your physician wants you to follow-up in: 12 months with Chanetta Marshall, NP You will receive a reminder letter in the mail two months in advance. If you don't receive a letter, please call our office to schedule the follow-up appointment.  Remote monitoring is used to monitor your Pacemaker of ICD from home. This monitoring reduces the number of office visits required to check your device to one time per year. It allows [name] to keep an eye on the functioning of your device to ensure it is working properly. You are scheduled for a device check from home on 05/24/15. You may send your transmission at any time that day. If you have a wireless device, the transmission will be sent automatically. After your physician reviews your transmission, you will receive a postcard with your next transmission date.    Any Other Special Instructions Will Be Listed Below (If Applicable).

## 2015-02-22 NOTE — Progress Notes (Signed)
PCP: Alesia Richards, MD Primary Cardiologist:  Dr Sonda Primes is a 74 y.o. female who presents today for routine electrophysiology followup.   Since her last visit, her exercise tolerance/ SOB/ fatigue are improved. She is doing well. Today, she denies symptoms of palpitations, chest pain, lower extremity edema, dizziness, presyncope, or syncope.  The patient is otherwise without complaint today.   Past Medical History  Diagnosis Date  . Mitral regurgitation     Treated with repair, atrial appendage was removed or tied off at surgery.. this was proven by TEE February, 2011  . Pulmonary HTN     55 mmHg, echo, February, 2011 / no mention of pulmonary hypertension echo, June, 2011  . Right ventricular dysfunction     Mild to moderate, echo, February, 2011 / normalized echo, June, 2011  . TR (tricuspid regurgitation)     Moderate, echo, February, 2011 / trivial, echo, June, 2011  . Mitral stenosis     Mild, February, 2011, post mitral valve repair / mild, echo, June, 2011  . CAD (coronary artery disease)     LIMA to the LAD at time of mitral valve repair / LIMA atretic,, February, 2011  . Ejection fraction < 50%     EF 30% echo and TEE diagnosis February, 2011, possibly rate related / no contraction by catheter, bradycardia so carvedilol cannot be used / EF 55-60% echo, June, 2011 / EF 50%, echo, February, 2012  . Hemorrhoids, internal   . Diverticulosis of colon   . Colon polyp, hyperplastic   . H/O amiodarone therapy     Satrted 02/12/2012  . High cholesterol   . Asthma   . COPD (chronic obstructive pulmonary disease)   . HTN (hypertension)     takes Mavik daily  . Myocardial infarction 04/2012  ?  . CHF (congestive heart failure) 2011    Mild in hospital February, 2011;takes Lasix daily  . Seasonal allergies     takes Claritin daily  . Type II diabetes mellitus     takes Metformin daily  . Cataracts, bilateral     immature  . Depression     takes Effexor daily   . Atrial fibrillation     Treated with maze procedure / recurrent atrial fibrillation February, 2011... TEE cardioversion October 19, 2009; s/p AVN ablatin  . QT prolongation     Tikosyn and Effexor. QT prolonged October 13, 2011, peak is in dose reduced from 500  to -250 twice a day  . Anemia     Hemoglobin 10.4, December, 2013  . GERD (gastroesophageal reflux disease)   . Arthritis     "not bad; little in my hands; some in my knees" (11/04/2013)  . Cecal cancer 2014   Past Surgical History  Procedure Laterality Date  . Mitral valve repair  2001    "anterior and posterior leaflets" (12/04/2012)  . Maze  2001    w/ MVR & CABG  . Cardioversion  02/08/2012    Procedure: CARDIOVERSION;  Surgeon: Carlena Bjornstad, MD;  Location: [place];  Service: Cardiovascular;  Laterality: N/A;  . Cardioversion  02/29/2012    Procedure: CARDIOVERSION;  Surgeon: Carlena Bjornstad, MD;  Location: [place];  Service: Cardiovascular;  Laterality: N/A;  . Cardioversion  05/15/2012    Procedure: CARDIOVERSION;  Surgeon: Carlena Bjornstad, MD;  Location: [facility] ENDOSCOPY;  Service: Cardiovascular;  Laterality: N/A;  . Tonsillectomy and adenoidectomy  ~ 1950  . Pacemaker insertion  05/17/2012  MDT Adapta L implanted by Dr Rayann Heman for tachy/brady syndrome  . Av node ablation  05/28/12  . Cardiac catheterization  05/2012  . Laparoscopic right hemi colectomy  12/03/2012    Procedure: LAPAROSCOPIC RIGHT HEMI COLECTOMY;  Surgeon: Stark Klein, MD;  Location: [place];  Service: General;;  . Umbilical hernia repair N/A 12/03/2012    Procedure: HERNIA REPAIR UMBILICAL ADULT;  Surgeon: Stark Klein, MD;  Location: [place];  Service: General;  Laterality: N/A;  . Diagnostic laparoscopic liver biopsy Left 12/03/2012    Procedure: DIAGNOSTIC LAPAROSCOPIC LIVER BIOPSY;  Surgeon: Stark Klein, MD;  Location: [place];  Service: General;  Laterality: Left;  . Coronary artery bypass graft  2001    CABG X1 "at time of mitral valve repair" (12/04/2012  . Breast  lumpectomy Bilateral 1990's  . Cholecystectomy  1995  . Abdominal hysterectomy  ?2002  . Bi-ventricular pacemaker upgrade  11/04/2013    upgrade of previously implanted dual chamber pacemaker to STJ Quadra Allure CRTP by Dr Rayann Heman  . Permanent pacemaker insertion N/A 05/17/2012    Procedure: PERMANENT PACEMAKER INSERTION;  Surgeon: Thompson Grayer, MD;  Location: [facility] CATH LAB;  Service: Cardiovascular;  Laterality: N/A;  . Av node ablation N/A 05/28/2012    Procedure: AV NODE ABLATION;  Surgeon: Thompson Grayer, MD;  Location: [facility] CATH LAB;  Service: Cardiovascular;  Laterality: N/A;  . Bi-ventricular pacemaker upgrade N/A 11/04/2013    Procedure: BI-VENTRICULAR PACEMAKER UPGRADE;  Surgeon: Coralyn Mark, MD;  Location: [place] CATH LAB;  Service: Cardiovascular;  Laterality: N/A;    Current Outpatient Prescriptions  Medication Sig Dispense Refill  . acetaminophen (TYLENOL) 500 MG tablet Take 1,000 mg by mouth every 6 (six) hours as needed. For pain    . atorvastatin (LIPITOR) 40 MG tablet Take 1 tablet by mouth  daily 90 tablet 0  . calcium carbonate (OS-CAL) 600 MG TABS Take 600 mg by mouth daily.     . carvedilol (COREG) 6.25 MG tablet Take 1 tablet by mouth  twice a day with meals 180 tablet 0  . cholecalciferol (VITAMIN D) 1000 UNITS tablet Take 4,000 Units by mouth daily.    . fish oil-omega-3 fatty acids 1000 MG capsule Take 1 g by mouth 2 (two) times daily.     . furosemide (LASIX) 40 MG tablet Take 0.5 tablets (20 mg total) by mouth daily. 90 tablet 3  . loperamide (IMODIUM) 2 MG capsule Take 1 capsule (2 mg total) by mouth daily as needed for diarrhea or loose stools. 30 capsule 10  . loratadine (CLARITIN) 10 MG tablet Take 10 mg by mouth daily.    . metFORMIN (GLUCOPHAGE XR) 500 MG 24 hr tablet Take 1 to 2 tablets 2 x day for Diabetes (Patient taking differently: Take 500 mg by mouth 2 (two) times daily. Take 1 to 2 tablets 2 x day for Diabetes) 360 tablet 99  . potassium chloride SA (KLOR-CON M20)  20 MEQ tablet Take 3 tablets (60 mEq total) by mouth daily. 90 tablet 3  . trandolapril (MAVIK) 4 MG tablet Take 1 tablet by mouth two  times daily for blood  pressure 180 tablet 3  . venlafaxine XR (EFFEXOR-XR) 37.5 MG 24 hr capsule Take 1 capsule by mouth  every evening 90 capsule 3  . warfarin (COUMADIN) 2.5 MG tablet Take as directed by  anticoagulation clinic 100 tablet 1   No current facility-administered medications for this visit.   ROS- all systems are reviewed and negative except as  per HPI above  Physical Exam: Filed Vitals:   02/22/15 1241  BP: 178/68  Pulse: 70  Height: 5\' 3"  (1.6 m)  Weight: 80.105 kg (176 lb 9.6 oz)    GEN- The patient is alert and oriented x 3 today.    Head- normocephalic, atraumatic Eyes-  Sclera clear, conjunctiva pink Ears- hearing intact Oropharynx- clear Lungs- Clear to ausculation bilaterally, normal work of breathing Chest- pacemaker pocket is well healed Heart- Regular rate and rhythm (paced) GI- soft, NT, ND, + BS Extremities- no clubbing, cyanosis, or edema  Pacemaker interrogation- reviewed in detail today,  See PACEART report ekg today reveals afib, V pacing   Assessment and Plan:  1. Chronic systolic dysfunction Stable euvolemic Followed in ICM device clinic with Alvis Lemmings  2. Permanent afib Continue long term anticoagulation  3. Complete heart block Normal pacemaker function See Pace Art report No changes today  4. CAD Stable No change required today  Merlin Return to see EP NP in 1 year

## 2015-02-26 ENCOUNTER — Other Ambulatory Visit: Payer: Self-pay

## 2015-02-26 MED ORDER — CARVEDILOL 6.25 MG PO TABS: ORAL_TABLET | ORAL | Status: DC

## 2015-03-03 ENCOUNTER — Ambulatory Visit: Payer: Self-pay | Admitting: Internal Medicine

## 2015-03-11 ENCOUNTER — Ambulatory Visit (INDEPENDENT_AMBULATORY_CARE_PROVIDER_SITE_OTHER): Payer: Medicare Other | Admitting: Internal Medicine

## 2015-03-11 ENCOUNTER — Encounter: Payer: Self-pay | Admitting: Internal Medicine

## 2015-03-11 VITALS — BP 126/74 | HR 72 | Temp 97.0°F | Resp 16 | Ht 62.5 in | Wt 173.2 lb

## 2015-03-11 DIAGNOSIS — I10 Essential (primary) hypertension: Secondary | ICD-10-CM | POA: Diagnosis not present

## 2015-03-11 DIAGNOSIS — E119 Type 2 diabetes mellitus without complications: Secondary | ICD-10-CM | POA: Diagnosis not present

## 2015-03-11 DIAGNOSIS — Z79899 Other long term (current) drug therapy: Secondary | ICD-10-CM

## 2015-03-11 DIAGNOSIS — E559 Vitamin D deficiency, unspecified: Secondary | ICD-10-CM

## 2015-03-11 DIAGNOSIS — Z6831 Body mass index (BMI) 31.0-31.9, adult: Secondary | ICD-10-CM

## 2015-03-11 DIAGNOSIS — E782 Mixed hyperlipidemia: Secondary | ICD-10-CM | POA: Diagnosis not present

## 2015-03-11 LAB — HEPATIC FUNCTION PANEL
ALBUMIN: 4.5 g/dL (ref 3.5–5.2)
ALT: 20 U/L (ref 0–35)
AST: 22 U/L (ref 0–37)
Alkaline Phosphatase: 88 U/L (ref 39–117)
Bilirubin, Direct: 0.2 mg/dL (ref 0.0–0.3)
Indirect Bilirubin: 0.7 mg/dL (ref 0.2–1.2)
Total Bilirubin: 0.9 mg/dL (ref 0.2–1.2)
Total Protein: 7.1 g/dL (ref 6.0–8.3)

## 2015-03-11 LAB — CBC WITH DIFFERENTIAL/PLATELET
BASOS ABS: 0.1 10*3/uL (ref 0.0–0.1)
BASOS PCT: 1 % (ref 0–1)
Eosinophils Absolute: 0.1 10*3/uL (ref 0.0–0.7)
Eosinophils Relative: 2 % (ref 0–5)
HEMATOCRIT: 45.1 % (ref 36.0–46.0)
HEMOGLOBIN: 14.9 g/dL (ref 12.0–15.0)
Lymphocytes Relative: 31 % (ref 12–46)
Lymphs Abs: 1.8 10*3/uL (ref 0.7–4.0)
MCH: 29.2 pg (ref 26.0–34.0)
MCHC: 33 g/dL (ref 30.0–36.0)
MCV: 88.4 fL (ref 78.0–100.0)
MONO ABS: 0.6 10*3/uL (ref 0.1–1.0)
MPV: 10 fL (ref 8.6–12.4)
Monocytes Relative: 10 % (ref 3–12)
NEUTROS ABS: 3.3 10*3/uL (ref 1.7–7.7)
Neutrophils Relative %: 56 % (ref 43–77)
PLATELETS: 217 10*3/uL (ref 150–400)
RBC: 5.1 MIL/uL (ref 3.87–5.11)
RDW: 14.3 % (ref 11.5–15.5)
WBC: 5.9 10*3/uL (ref 4.0–10.5)

## 2015-03-11 LAB — LIPID PANEL
Cholesterol: 151 mg/dL (ref 0–200)
HDL: 54 mg/dL (ref >=46)
LDL Cholesterol: 59 mg/dL (ref 0–99)
Total CHOL/HDL Ratio: 2.8 Ratio
Triglycerides: 192 mg/dL — ABNORMAL HIGH (ref <150)
VLDL: 38 mg/dL (ref 0–40)

## 2015-03-11 LAB — BASIC METABOLIC PANEL WITH GFR
BUN: 12 mg/dL (ref 6–23)
CHLORIDE: 103 meq/L (ref 96–112)
CO2: 30 mEq/L (ref 19–32)
Calcium: 9.2 mg/dL (ref 8.4–10.5)
Creat: 0.55 mg/dL (ref 0.50–1.10)
GFR, Est African American: >89 mL/min
GFR, Est Non African American: >89 mL/min
Glucose, Bld: 130 mg/dL — ABNORMAL HIGH (ref 70–99)
POTASSIUM: 4.2 meq/L (ref 3.5–5.3)
Sodium: 142 mEq/L (ref 135–145)

## 2015-03-11 LAB — MAGNESIUM: Magnesium: 1.9 mg/dL (ref 1.5–2.5)

## 2015-03-11 LAB — TSH: TSH: 1.104 u[IU]/mL (ref 0.350–4.500)

## 2015-03-11 LAB — HEMOGLOBIN A1C
Hgb A1c MFr Bld: 7.5 % — ABNORMAL HIGH (ref <5.7)
Mean Plasma Glucose: 169 mg/dL — ABNORMAL HIGH (ref <117)

## 2015-03-11 NOTE — Progress Notes (Signed)
Patient ID: Anita Duke, female   DOB: [birth year], 74 y.o.   MRN: [id number]   This very nice 74 y.o.female presents for 3 month follow up with Hypertension, Hyperlipidemia, Pre-Diabetes and Vitamin D Deficiency.    Patient is treated for HTN since 1969 with treatment started in 1990 & BP has been controlled at home. In 2011 she had CABG & MV Repair. She has hx/o pAfib and had a MAZe procedure. In 2013  She had a Permanent Pacer insertion with a BiV upgrade in 2015 and is followed by Dr Ron Parker & Allred. Today's BP: 126/74 mmHg. Patient has had no complaints of any cardiac type chest pain, palpitations, dyspnea/orthopnea/PND, dizziness, claudication, or dependent edema.   Hyperlipidemia is controlled with diet & meds. Patient denies myalgias or other med SE's. Last Lipids were 12/02/2014: Cholesterol 138; HDL 44*; LDL Cholesterol 60; Triglycerides 171*   Also, the patient has history of Morbid Obesity (BMI 31+) and consequent T2_NIDDM and has had no symptoms of reactive hypoglycemia, diabetic polys, paresthesias or visual blurring.  Last A1c was 7.6% on 12/02/2014.   Further, the patient also has history of Vitamin D Deficiency and supplements vitamin D without any suspected side-effects. Last vitamin D was 50 on 12/02/2014.  Medication Sig  . acetaminophen (TYLENOL) 500 MG tablet Take 1,000 mg by mouth every 6 (six) hours as needed. For pain  . atorvastatin (LIPITOR) 40 MG tablet Take 1 tablet by mouth  daily  . OS-CAL 600 MG TABS Take 600 mg by mouth daily.   . carvedilol (COREG) 6.25 MG tablet Take 1 tablet by mouth  twice a day with meals  . cholecalciferol (VITAMIN D) 1000 UNITS tablet Take 4,000 Units by mouth daily.  . fish oil-omega-3 fatty acids 1000 MG capsule Take 1 g by mouth 2 (two) times daily.   . furosemide (LASIX) 40 MG tablet Take 0.5 tablets (20 mg total) by mouth daily.  Marland Kitchen loperamide (IMODIUM) 2 MG capsule Take 1 capsule (2 mg total) by mouth daily as needed for diarrhea or loose  stools.  Marland Kitchen loratadine (CLARITIN) 10 MG tablet Take 10 mg by mouth daily.  . metFORMIN (GLUCOPHAGE XR) 500 MG 24 hr tablet Take 1 to 2 tablets 2 x day for Diabetes (Patient taking differently: Take 500 mg by mouth 2 (two) times daily. Take 1 to 2 tablets 2 x day for Diabetes)  . potassium chloride SA (KLOR-CON M20) 20 MEQ tablet Take 3 tablets (60 mEq total) by mouth daily.  . trandolapril (MAVIK) 4 MG tablet Take 1 tablet by mouth two  times daily for blood  pressure  . venlafaxine XR (EFFEXOR-XR) 37.5 MG 24 hr capsule Take 1 capsule by mouth  every evening  . warfarin (COUMADIN) 2.5 MG tablet Take as directed by  anticoagulation clinic   Allergies  Allergen Reactions  . Demerol [Meperidine] Nausea And Vomiting  . Morphine Other (See Comments)    vomiting  . Tetanus Toxoid Rash    "years ago"   PMHx:   Past Medical History  Diagnosis Date  . Mitral regurgitation     Treated with repair, atrial appendage was removed or tied off at surgery.. this was proven by TEE February, 2011  . Pulmonary HTN     55 mmHg, echo, February, 2011 / no mention of pulmonary hypertension echo, June, 2011  . Right ventricular dysfunction     Mild to moderate, echo, February, 2011 / normalized echo, June, 2011  . TR (tricuspid regurgitation)  Moderate, echo, February, 2011 / trivial, echo, June, 2011  . Mitral stenosis     Mild, February, 2011, post mitral valve repair / mild, echo, June, 2011  . CAD (coronary artery disease)     LIMA to the LAD at time of mitral valve repair / LIMA atretic,, February, 2011  . Ejection fraction < 50%     EF 30% echo and TEE diagnosis February, 2011, possibly rate related / no contraction by catheter, bradycardia so carvedilol cannot be used / EF 55-60% echo, June, 2011 / EF 50%, echo, February, 2012  . Hemorrhoids, internal   . Diverticulosis of colon   . Colon polyp, hyperplastic   . H/O amiodarone therapy     Satrted 02/12/2012  . High cholesterol   . Asthma   .  COPD (chronic obstructive pulmonary disease)   . HTN (hypertension)     takes Mavik daily  . Myocardial infarction 04/2012  ?  . CHF (congestive heart failure) 2011    Mild in hospital February, 2011;takes Lasix daily  . Seasonal allergies     takes Claritin daily  . Type II diabetes mellitus     takes Metformin daily  . Cataracts, bilateral     immature  . Depression     takes Effexor daily  . Atrial fibrillation     Treated with maze procedure / recurrent atrial fibrillation February, 2011... TEE cardioversion October 19, 2009; s/p AVN ablatin  . QT prolongation     Tikosyn and Effexor. QT prolonged October 13, 2011, peak is in dose reduced from 500  to -250 twice a day  . Anemia     Hemoglobin 10.4, December, 2013  . GERD (gastroesophageal reflux disease)   . Arthritis     "not bad; little in my hands; some in my knees" (11/04/2013)  . Cecal cancer 2014   Immunization History  Administered Date(s) Administered  . Influenza Split 06/24/2011  . Influenza Whole 06/04/2012, 05/16/2013  . Pneumococcal Conjugate-13 08/25/2014  . Pneumococcal Polysaccharide-23 08/10/2011  . Zoster 05/23/2006   Past Surgical History  Procedure Laterality Date  . Mitral valve repair  2001    "anterior and posterior leaflets" (12/04/2012)  . Maze  2001    w/ MVR & CABG  . Cardioversion  02/08/2012    Procedure: CARDIOVERSION;  Surgeon: Carlena Bjornstad, MD;  Location: [place];  Service: Cardiovascular;  Laterality: N/A;  . Cardioversion  02/29/2012    Procedure: CARDIOVERSION;  Surgeon: Carlena Bjornstad, MD;  Location: [place];  Service: Cardiovascular;  Laterality: N/A;  . Cardioversion  05/15/2012    Procedure: CARDIOVERSION;  Surgeon: Carlena Bjornstad, MD;  Location: [facility] ENDOSCOPY;  Service: Cardiovascular;  Laterality: N/A;  . Tonsillectomy and adenoidectomy  ~ 1950  . 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 Av node ablation  05/28/12  . Cardiac  catheterization  05/2012  . Laparoscopic right hemi colectomy  12/03/2012    Procedure: LAPAROSCOPIC RIGHT HEMI COLECTOMY;  Surgeon: Stark Klein, MD;  Location: [place];  Service: General;;  . Umbilical hernia repair N/A 12/03/2012    Procedure: HERNIA REPAIR UMBILICAL ADULT;  Surgeon: Stark Klein, MD;  Location: [place];  Service: General;  Laterality: N/A;  . Diagnostic laparoscopic liver biopsy Left 12/03/2012    Procedure: DIAGNOSTIC LAPAROSCOPIC LIVER BIOPSY;  Surgeon: Stark Klein, MD;  Location: [place];  Service: General;  Laterality: Left;  . Coronary artery bypass  graft  2001    CABG X1 "at time of mitral valve repair" (12/04/2012  . Breast lumpectomy Bilateral 1990's  . Cholecystectomy  1995  . Abdominal hysterectomy  ?2002  . Bi-ventricular pacemaker upgrade  11/04/2013    upgrade of previously implanted dual chamber pacemaker to STJ Quadra Allure CRTP by Dr Rayann Heman  . Permanent pacemaker insertion N/A 05/17/2012    Procedure: PERMANENT PACEMAKER INSERTION;  Surgeon: Thompson Grayer, MD;  Location: [facility] CATH LAB;  Service: Cardiovascular;  Laterality: N/A;  . Av node ablation N/A 05/28/2012    Procedure: AV NODE ABLATION;  Surgeon: Thompson Grayer, MD;  Location: [facility] CATH LAB;  Service: Cardiovascular;  Laterality: N/A;  . Bi-ventricular pacemaker upgrade N/A 11/04/2013    Procedure: BI-VENTRICULAR PACEMAKER UPGRADE;  Surgeon: Coralyn Mark, MD;  Location: [place] CATH LAB;  Service: Cardiovascular;  Laterality: N/A;   FHx:    Reviewed / unchanged  SHx:    Reviewed / unchanged  Systems Review:  Constitutional: Denies fever, chills, wt changes, headaches, insomnia, fatigue, night sweats, change in appetite. Eyes: Denies redness, blurred vision, diplopia, discharge, itchy, watery eyes.  ENT: Denies discharge, congestion, post nasal drip, epistaxis, sore throat, earache, hearing loss, dental pain, tinnitus, vertigo, sinus pain, snoring.  CV: Denies chest pain, palpitations, irregular heartbeat, syncope,  dyspnea, diaphoresis, orthopnea, PND, claudication or edema. Respiratory: denies cough, dyspnea, DOE, pleurisy, hoarseness, laryngitis, wheezing.  Gastrointestinal: Denies dysphagia, odynophagia, heartburn, reflux, water brash, abdominal pain or cramps, nausea, vomiting, bloating, diarrhea, constipation, hematemesis, melena, hematochezia  or hemorrhoids. Genitourinary: Denies dysuria, frequency, urgency, nocturia, hesitancy, discharge, hematuria or flank pain. Musculoskeletal: Denies arthralgias, myalgias, stiffness, jt. swelling, pain, limping or strain/sprain.  Skin: Denies pruritus, rash, hives, warts, acne, eczema or change in skin lesion(s). Neuro: No weakness, tremor, incoordination, spasms, paresthesia or pain. Psychiatric: Denies confusion, memory loss or sensory loss. Endo: Denies change in weight, skin or hair change.  Heme/Lymph: No excessive bleeding, bruising or enlarged lymph nodes.  Physical Exam  BP 126/74   Pulse 72  Temp 97 F   Resp 16  Ht 5' 2.5"   Wt 173 lb     BMI 31.15  Appears well nourished and in no distress. Eyes: PERRLA, EOMs, conjunctiva no swelling or erythema. Sinuses: No frontal/maxillary tenderness ENT/Mouth: EAC's clear, TM's nl w/o erythema, bulging. Nares clear w/o erythema, swelling, exudates. Oropharynx clear without erythema or exudates. Oral hygiene is good. Tongue normal, non obstructing. Hearing intact.  Neck: Supple. Thyroid nl. Car 2+/2+ without bruits, nodes or JVD. Chest: Respirations nl with BS clear & equal w/o rales, rhonchi, wheezing or stridor.  Cor: Heart sounds normal w/ regular rate and rhythm without sig. murmurs, gallops, clicks, or rubs. Peripheral pulses normal and equal  without edema.  Abdomen: Soft & bowel sounds normal. Non-tender w/o guarding, rebound, hernias, masses, or organomegaly.  Lymphatics: Unremarkable.  Musculoskeletal: Full ROM all peripheral extremities, joint stability, 5/5 strength, and normal gait.  Skin:  Warm, dry without exposed rashes, lesions or ecchymosis apparent.  Neuro: Cranial nerves intact, reflexes equal bilaterally. Sensory-motor testing grossly intact. Tendon reflexes grossly intact.  Pysch: Alert & oriented x 3.  Insight and judgement nl & appropriate. No ideations.  Assessment and Plan:  1. Essential hypertension  - TSH  2. Hyperlipidemia  - Lipid panel  3. Diabetes mellitus type II, controlled  - Hemoglobin A1c - Insulin, random  4. Vitamin D deficiency  - Vit D  25 hydroxy (rtn osteoporosis monitoring)  5. Medication management  -  CBC with Differential/Platelet - BASIC METABOLIC PANEL WITH GFR - Hepatic function panel - Magnesium   Recommended regular exercise, BP monitoring, weight control, and discussed med and SE's. Recommended labs to assess and monitor clinical status. Further disposition pending results of labs. Over 30 minutes of exam, counseling, chart review was performed

## 2015-03-11 NOTE — Patient Instructions (Signed)

## 2015-03-12 LAB — INSULIN, RANDOM: Insulin: 9.1 u[IU]/mL (ref 2.0–19.6)

## 2015-03-12 LAB — VITAMIN D 25 HYDROXY (VIT D DEFICIENCY, FRACTURES): VIT D 25 HYDROXY: 56 ng/mL (ref 30–100)

## 2015-03-16 ENCOUNTER — Telehealth: Payer: Self-pay | Admitting: *Deleted

## 2015-03-16 MED ORDER — BENZONATATE 200 MG PO CAPS: 200.0000 mg | ORAL_CAPSULE | Freq: Three times a day (TID) | ORAL | Status: DC | PRN

## 2015-03-16 MED ORDER — PREDNISONE 20 MG PO TABS: ORAL_TABLET | ORAL | Status: DC

## 2015-03-16 MED ORDER — AZITHROMYCIN 250 MG PO TABS: ORAL_TABLET | ORAL | Status: AC

## 2015-03-16 NOTE — Telephone Encounter (Signed)
Patient called and states she has a dry cough.  Per Dr Melford Aase, [place] to send in RX for Z-pak, Prednisone and Tessalon Perles.  Patient advised to cut Coumadin dose in 1/2 while on the Z-pak.

## 2015-03-18 ENCOUNTER — Ambulatory Visit (INDEPENDENT_AMBULATORY_CARE_PROVIDER_SITE_OTHER): Payer: Medicare Other | Admitting: *Deleted

## 2015-03-18 DIAGNOSIS — Z5181 Encounter for therapeutic drug level monitoring: Secondary | ICD-10-CM | POA: Diagnosis not present

## 2015-03-18 DIAGNOSIS — I4891 Unspecified atrial fibrillation: Secondary | ICD-10-CM | POA: Diagnosis not present

## 2015-03-18 LAB — POCT INR: INR: 2.4

## 2015-03-22 ENCOUNTER — Ambulatory Visit (INDEPENDENT_AMBULATORY_CARE_PROVIDER_SITE_OTHER): Payer: Medicare Other

## 2015-03-22 DIAGNOSIS — I4891 Unspecified atrial fibrillation: Secondary | ICD-10-CM | POA: Diagnosis not present

## 2015-03-22 DIAGNOSIS — Z5181 Encounter for therapeutic drug level monitoring: Secondary | ICD-10-CM | POA: Diagnosis not present

## 2015-03-22 LAB — POCT INR: INR: 2.3

## 2015-03-25 ENCOUNTER — Ambulatory Visit (INDEPENDENT_AMBULATORY_CARE_PROVIDER_SITE_OTHER): Payer: Medicare Other | Admitting: *Deleted

## 2015-03-25 ENCOUNTER — Encounter: Payer: Self-pay | Admitting: *Deleted

## 2015-03-25 DIAGNOSIS — I519 Heart disease, unspecified: Secondary | ICD-10-CM | POA: Diagnosis not present

## 2015-03-25 DIAGNOSIS — Z950 Presence of cardiac pacemaker: Secondary | ICD-10-CM

## 2015-03-25 NOTE — Progress Notes (Signed)
EPIC Encounter for ICM Monitoring  Patient Name: Anita Duke is a 74 y.o. female Date: 03/25/2015 Primary Care Physican: Alesia Richards, MD Primary Cardiologist: Ron Parker Electrophysiologist: Allred Dry Weight: 173 lbs  Bi- V pacing: 99%       In the past month, have you:  1. Gained more than 2 pounds in a day or more than 5 pounds in a week? Uncertain- she does not weigh at home regularly.  2. Had changes in your medications (with verification of current medications)? No. She will forget to take her diuretic at times.  3. Had more shortness of breath than is usual for you? no  4. Limited your activity because of shortness of breath? no  5. Not been able to sleep because of shortness of breath? no  6. Had increased swelling in your feet or ankles? no  7. Had symptoms of dehydration (dizziness, dry mouth, increased thirst, decreased urine output) no  8. Had changes in sodium restriction? no  9. Been compliant with medication? Yes   ICM trend:   Follow-up plan: ICM clinic phone appointment: 04/26/15. The patient's corvue trends were slightly elevated from ~ 7/9-7/17. The patient denies any change in her symptoms at that time. She thinks she may have forgotten to take her fluid pills some during that time.  Copy of note sent to patient's primary care physician, primary cardiologist, and device following physician.  Alvis Lemmings, RN, BSN 03/25/2015 12:16 PM

## 2015-03-26 ENCOUNTER — Other Ambulatory Visit: Payer: Self-pay

## 2015-03-26 ENCOUNTER — Other Ambulatory Visit: Payer: Self-pay | Admitting: Cardiology

## 2015-03-26 MED ORDER — POTASSIUM CHLORIDE CRYS ER 20 MEQ PO TBCR: 60.0000 meq | EXTENDED_RELEASE_TABLET | Freq: Every day | ORAL | Status: DC

## 2015-03-26 NOTE — Telephone Encounter (Signed)
Refill complete 

## 2015-03-29 ENCOUNTER — Other Ambulatory Visit: Payer: Self-pay

## 2015-03-29 MED ORDER — POTASSIUM CHLORIDE CRYS ER 20 MEQ PO TBCR: 60.0000 meq | EXTENDED_RELEASE_TABLET | Freq: Every day | ORAL | Status: DC

## 2015-04-26 ENCOUNTER — Telehealth: Payer: Self-pay

## 2015-04-26 ENCOUNTER — Encounter: Payer: Medicare Other | Admitting: *Deleted

## 2015-04-26 NOTE — Telephone Encounter (Signed)
ICM transmission received.  Spoke with patient and she said she was in a meeting.  She requested a call back tomorrow and said she should be home all day.

## 2015-04-27 ENCOUNTER — Ambulatory Visit (INDEPENDENT_AMBULATORY_CARE_PROVIDER_SITE_OTHER): Payer: Medicare Other | Admitting: *Deleted

## 2015-04-27 DIAGNOSIS — Z5181 Encounter for therapeutic drug level monitoring: Secondary | ICD-10-CM

## 2015-04-27 DIAGNOSIS — I4891 Unspecified atrial fibrillation: Secondary | ICD-10-CM

## 2015-04-27 LAB — POCT INR: INR: 1.9

## 2015-04-27 NOTE — Telephone Encounter (Signed)
Spoke with patient. She stated she was not home but would call back later today

## 2015-04-29 ENCOUNTER — Other Ambulatory Visit: Payer: Self-pay | Admitting: Cardiology

## 2015-04-29 ENCOUNTER — Other Ambulatory Visit: Payer: Self-pay | Admitting: Internal Medicine

## 2015-04-30 NOTE — Telephone Encounter (Signed)
Unable to reach patient for ICM transmission review. Will follow up next month

## 2015-05-24 ENCOUNTER — Ambulatory Visit (INDEPENDENT_AMBULATORY_CARE_PROVIDER_SITE_OTHER): Payer: Medicare Other | Admitting: *Deleted

## 2015-05-24 ENCOUNTER — Telehealth: Payer: Self-pay

## 2015-05-24 DIAGNOSIS — I495 Sick sinus syndrome: Secondary | ICD-10-CM | POA: Diagnosis not present

## 2015-05-24 NOTE — Telephone Encounter (Signed)
ICM transmission received.  Attempted call to patient and she stated it was not a good time.  She requested call back another time.

## 2015-05-26 ENCOUNTER — Encounter: Payer: Self-pay | Admitting: Cardiology

## 2015-05-26 ENCOUNTER — Ambulatory Visit (INDEPENDENT_AMBULATORY_CARE_PROVIDER_SITE_OTHER): Payer: Medicare Other | Admitting: Cardiology

## 2015-05-26 ENCOUNTER — Ambulatory Visit (INDEPENDENT_AMBULATORY_CARE_PROVIDER_SITE_OTHER): Payer: Medicare Other | Admitting: *Deleted

## 2015-05-26 VITALS — BP 134/90 | HR 70 | Ht 63.0 in | Wt 178.0 lb

## 2015-05-26 DIAGNOSIS — Z950 Presence of cardiac pacemaker: Secondary | ICD-10-CM | POA: Diagnosis not present

## 2015-05-26 DIAGNOSIS — R0989 Other specified symptoms and signs involving the circulatory and respiratory systems: Secondary | ICD-10-CM | POA: Diagnosis not present

## 2015-05-26 DIAGNOSIS — Z9889 Other specified postprocedural states: Secondary | ICD-10-CM

## 2015-05-26 DIAGNOSIS — I2510 Atherosclerotic heart disease of native coronary artery without angina pectoris: Secondary | ICD-10-CM

## 2015-05-26 DIAGNOSIS — R9430 Abnormal result of cardiovascular function study, unspecified: Secondary | ICD-10-CM

## 2015-05-26 DIAGNOSIS — Z5181 Encounter for therapeutic drug level monitoring: Secondary | ICD-10-CM | POA: Diagnosis not present

## 2015-05-26 DIAGNOSIS — I4891 Unspecified atrial fibrillation: Secondary | ICD-10-CM | POA: Diagnosis not present

## 2015-05-26 LAB — POCT INR: INR: 2.5

## 2015-05-26 NOTE — Assessment & Plan Note (Signed)
The patient had mitral valve repair with a maze procedure many years ago at the [facility]. Her valve has continued to work well. She has some functional mitral stenosis.

## 2015-05-26 NOTE — Progress Notes (Signed)
Cardiology Office Note   Date:  05/26/2015   ID:  Monserrath, Junio [birth year], MRN [id number]  PCP:  Alesia Richards, MD  Cardiologist:  Dola Argyle, MD   Chief Complaint  Patient presents with  . Appointment    Follow-up atrial arrhythmias      History of Present Illness: Anita Duke is a 74 y.o. female who presents to follow-up atrial arrhythmias and left ventricular dysfunction and mitral valvular disease. She has a very complex history. However, she is now doing very well after all of the treatment that she has received over the years. Her most recent ejection fraction was in the range of 40%. This was after she underwent a CRT-P upgrade by Dr. Rayann Heman. She is significantly improved since that time. Despite the fact that her EF has not improved further, she feels much better and is able to function normally. Previously she had significant exertional shortness of breath.      Past Medical History  Diagnosis Date  . Mitral regurgitation     Treated with repair, atrial appendage was removed or tied off at surgery.. this was proven by TEE February, 2011  . Pulmonary HTN     55 mmHg, echo, February, 2011 / no mention of pulmonary hypertension echo, June, 2011  . Right ventricular dysfunction     Mild to moderate, echo, February, 2011 / normalized echo, June, 2011  . TR (tricuspid regurgitation)     Moderate, echo, February, 2011 / trivial, echo, June, 2011  . Mitral stenosis     Mild, February, 2011, post mitral valve repair / mild, echo, June, 2011  . CAD (coronary artery disease)     LIMA to the LAD at time of mitral valve repair / LIMA atretic,, February, 2011  . Ejection fraction < 50%     EF 30% echo and TEE diagnosis February, 2011, possibly rate related / no contraction by catheter, bradycardia so carvedilol cannot be used / EF 55-60% echo, June, 2011 / EF 50%, echo, February, 2012  . Hemorrhoids, internal   . Diverticulosis of colon   . Colon polyp,  hyperplastic   . H/O amiodarone therapy     Satrted 02/12/2012  . High cholesterol   . Asthma   . COPD (chronic obstructive pulmonary disease)   . HTN (hypertension)     takes Mavik daily  . Myocardial infarction 04/2012  ?  . CHF (congestive heart failure) 2011    Mild in hospital February, 2011;takes Lasix daily  . Seasonal allergies     takes Claritin daily  . Type II diabetes mellitus     takes Metformin daily  . Cataracts, bilateral     immature  . Depression     takes Effexor daily  . Atrial fibrillation     Treated with maze procedure / recurrent atrial fibrillation February, 2011... TEE cardioversion October 19, 2009; s/p AVN ablatin  . QT prolongation     Tikosyn and Effexor. QT prolonged October 13, 2011, peak is in dose reduced from 500  to -250 twice a day  . Anemia     Hemoglobin 10.4, December, 2013  . GERD (gastroesophageal reflux disease)   . Arthritis     "not bad; little in my hands; some in my knees" (11/04/2013)  . Cecal cancer 2014    Past Surgical History  Procedure Laterality Date  . Mitral valve repair  2001    "anterior and posterior leaflets" (12/04/2012)  . Maze  2001  w/ MVR & CABG  . Cardioversion  02/08/2012    Procedure: CARDIOVERSION;  Surgeon: Carlena Bjornstad, MD;  Location: [place];  Service: Cardiovascular;  Laterality: N/A;  . Cardioversion  02/29/2012    Procedure: CARDIOVERSION;  Surgeon: Carlena Bjornstad, MD;  Location: [place];  Service: Cardiovascular;  Laterality: N/A;  . Cardioversion  05/15/2012    Procedure: CARDIOVERSION;  Surgeon: Carlena Bjornstad, MD;  Location: [facility] ENDOSCOPY;  Service: Cardiovascular;  Laterality: N/A;  . Tonsillectomy and adenoidectomy  ~ 1950  . 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 Av node ablation  05/28/12  . Cardiac catheterization  05/2012  . Laparoscopic right hemi colectomy  12/03/2012    Procedure: LAPAROSCOPIC RIGHT HEMI COLECTOMY;  Surgeon: Stark Klein, MD;  Location: [place];  Service: General;;  . Umbilical hernia repair N/A 12/03/2012    Procedure: HERNIA REPAIR UMBILICAL ADULT;  Surgeon: Stark Klein, MD;  Location: [place];  Service: General;  Laterality: N/A;  . Diagnostic laparoscopic liver biopsy Left 12/03/2012    Procedure: DIAGNOSTIC LAPAROSCOPIC LIVER BIOPSY;  Surgeon: Stark Klein, MD;  Location: [place];  Service: General;  Laterality: Left;  . Coronary artery bypass graft  2001    CABG X1 "at time of mitral valve repair" (12/04/2012  . Breast lumpectomy Bilateral 1990's  . Cholecystectomy  1995  . Abdominal hysterectomy  ?2002  . Bi-ventricular pacemaker upgrade  11/04/2013    upgrade of previously implanted dual chamber pacemaker to STJ Quadra Allure CRTP by Dr Rayann Heman  . Permanent pacemaker insertion N/A 05/17/2012    Procedure: PERMANENT PACEMAKER INSERTION;  Surgeon: Thompson Grayer, MD;  Location: [facility] CATH LAB;  Service: Cardiovascular;  Laterality: N/A;  . Av node ablation N/A 05/28/2012    Procedure: AV NODE ABLATION;  Surgeon: Thompson Grayer, MD;  Location: [facility] CATH LAB;  Service: Cardiovascular;  Laterality: N/A;  . Bi-ventricular pacemaker upgrade N/A 11/04/2013    Procedure: BI-VENTRICULAR PACEMAKER UPGRADE;  Surgeon: Coralyn Mark, MD;  Location: [place] CATH LAB;  Service: Cardiovascular;  Laterality: N/A;    Patient Active Problem List   Diagnosis Date Noted  . Obesity 12/02/2014  . CAD (coronary artery disease) 05/28/2014  . Vitamin D deficiency 02/20/2014  . Medication management 02/20/2014  . Long term current use of anticoagulant therapy 11/24/2013  . Chronic systolic dysfunction of left ventricle 05/21/2013  . 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 Anemia   . Acquired complete AV block   . Sick sinus syndrome   . Pacemaker-Medtronic 05/20/2012  . Allergic rhinitis 04/25/2012  . Mild intermittent asthma 02/16/2011  . Ejection fraction < 50%   . Atrial fibrillation   . Mitral regurgitation   . Right  ventricular dysfunction   . Mitral stenosis   . S/P mitral valve repair   . Hx of CABG   . T2_NIDDM 03/02/2009  . Hyperlipidemia 07/07/2008  . Essential hypertension 07/07/2008  . Diverticulosis of large intestine 08/04/2002      Current Outpatient Prescriptions  Medication Sig Dispense Refill  . acetaminophen (TYLENOL) 500 MG tablet Take 1,000 mg by mouth every 6 (six) hours as needed. For pain    . atorvastatin (LIPITOR) 40 MG tablet Take 1 tablet by mouth  daily 90 tablet 3  . calcium carbonate (OS-CAL) 600 MG TABS Take 600 mg by mouth daily.     . carvedilol (COREG) 6.25 MG tablet Take 1  tablet by mouth  twice a day with meals 180 tablet 1  . cholecalciferol (VITAMIN D) 1000 UNITS tablet Take 6,000 Units by mouth daily.     . fish oil-omega-3 fatty acids 1000 MG capsule Take 1 g by mouth 2 (two) times daily.     . furosemide (LASIX) 40 MG tablet Take 0.5 tablets (20 mg total) by mouth daily. 90 tablet 3  . loperamide (IMODIUM) 2 MG capsule Take 1 capsule (2 mg total) by mouth daily as needed for diarrhea or loose stools. 30 capsule 10  . loratadine (CLARITIN) 10 MG tablet Take 10 mg by mouth daily.    . metFORMIN (GLUCOPHAGE XR) 500 MG 24 hr tablet Take 1 to 2 tablets 2 x day for Diabetes (Patient taking differently: Take 500 mg by mouth 2 (two) times daily. Take 1 to 2 tablets 2 x day for Diabetes) 360 tablet 99  . potassium chloride SA (KLOR-CON M20) 20 MEQ tablet Take 3 tablets (60 mEq total) by mouth daily. 90 tablet 3  . trandolapril (MAVIK) 4 MG tablet Take 1 tablet by mouth two  times daily for blood  pressure 180 tablet 3  . venlafaxine XR (EFFEXOR-XR) 37.5 MG 24 hr capsule Take 1 capsule by mouth  every evening 90 capsule 1  . warfarin (COUMADIN) 2.5 MG tablet TAKE AS DIRECTED BY  ANTICOAGULATION CLINIC 100 tablet 0   No current facility-administered medications for this visit.    Allergies:   Demerol; Morphine; and Tetanus toxoid    Social History:  The patient   reports that she has never smoked. She has never used smokeless tobacco. She reports that she does not drink alcohol or use illicit drugs.   Family History:  The patient's family history includes Diabetes in her father, mother, paternal grandfather, and paternal grandmother; Heart disease in her father, paternal uncle, and sister; Kidney disease in her mother; Pancreatic cancer in her maternal grandfather; Prostate cancer in her paternal uncle. There is no history of Colon cancer or Colon polyps.    ROS:  Please see the history of present illness.    Patient denies fever, chills, headache, sweats, rash, change in vision, change in hearing, chest pain, cough, nausea or vomiting, urinary symptoms. All other systems are reviewed and are negative.    PHYSICAL EXAM: VS:  BP 134/90 mmHg  Pulse 70  Ht 5\' 3"  (1.6 m)  Wt 178 lb (80.74 kg)  BMI 31.54 kg/m2 , The patient really looks good. She is here with her husband. She is oriented to person time and place. Affect is normal. Head is atraumatic. Sclera and conjunctiva are normal. There is no jugular venous distention. Lungs are clear. Respiratory effort is nonlabored. Cardiac exam reveals S1 and S2. Abdomen is soft. There is no peripheral edema. There are no musculoskeletal deformities. There are no skin rashes.   EKG:   EKG is not done today.   Recent Labs: 03/11/2015: ALT 20; BUN 12; Creat 0.55; Hemoglobin 14.9; Magnesium 1.9; Platelets 217; Potassium 4.2; Sodium 142; TSH 1.104    Lipid Panel    Component Value Date/Time   CHOL 151 03/11/2015 1321   TRIG 192* 03/11/2015 1321   HDL 54 03/11/2015 1321   CHOLHDL 2.8 03/11/2015 1321   VLDL 38 03/11/2015 1321   LDLCALC 59 03/11/2015 1321      Wt Readings from Last 3 Encounters:  05/26/15 178 lb (80.74 kg)  03/11/15 173 lb 3.2 oz (78.563 kg)  02/22/15 176 lb 9.6 oz (  80.105 kg)      Current medicines are reviewed  The patient understands her medications.     ASSESSMENT AND PLAN:

## 2015-05-26 NOTE — Assessment & Plan Note (Signed)
Over the years she's had marked variation in left ventricular function. At times we have thought that this was related to her atrial arrhythmias. All of the specifics are outlined in the problem list. Most recent echo revealed an EF of 40% in April, 2016.

## 2015-05-26 NOTE — Assessment & Plan Note (Signed)
The patient received a LIMA at the time of her mitral valve surgery. Unfortunately follow-up studies show that her LIMA is atretic. Her most recent cath in 2014 revealed a 50-60% diagonal lesion. Her coronary status overall is stable.

## 2015-05-26 NOTE — Patient Instructions (Addendum)
Medication Instructions:  Same-no changes  Labwork: None  Testing/Procedures: None  Follow-Up: To be determined

## 2015-05-26 NOTE — Progress Notes (Signed)
Remote pacemaker transmission.   

## 2015-05-26 NOTE — Assessment & Plan Note (Signed)
The patient has had a multitude of different atrial arrhythmias with different treatments over time. Eventually it was decided that she needed AV node ablation along with a pacemaker. It was then decided that she would benefit with upgrade to biventricular CRT pacing. This was done in March, 2015. Clinically she has had a significant response with improvement since that time. This has helped her dramatically with her main symptom, shortness of breath. Her ejection fraction has not improved significantly. However clinically she is clearly better. No change in therapy.

## 2015-05-31 ENCOUNTER — Other Ambulatory Visit: Payer: Self-pay | Admitting: Internal Medicine

## 2015-05-31 ENCOUNTER — Telehealth: Payer: Self-pay

## 2015-05-31 ENCOUNTER — Other Ambulatory Visit: Payer: Self-pay | Admitting: Cardiology

## 2015-05-31 LAB — CUP PACEART REMOTE DEVICE CHECK
Date Time Interrogation Session: 20160919080014
Lead Channel Pacing Threshold Amplitude: 0.75 V
Lead Channel Pacing Threshold Pulse Width: 0.4 ms
Lead Channel Sensing Intrinsic Amplitude: 8.1 mV
Lead Channel Setting Pacing Amplitude: 2 V
Lead Channel Setting Pacing Amplitude: 2.375
Lead Channel Setting Pacing Pulse Width: 0.4 ms
Lead Channel Setting Pacing Pulse Width: 0.6 ms
Lead Channel Setting Sensing Sensitivity: 3 mV
MDC IDC MSMT BATTERY REMAINING LONGEVITY: 92 mo
MDC IDC MSMT BATTERY REMAINING PERCENTAGE: 95.5 %
MDC IDC MSMT BATTERY VOLTAGE: 2.98 V
MDC IDC MSMT LEADCHNL LV IMPEDANCE VALUE: 430 Ohm
MDC IDC MSMT LEADCHNL LV PACING THRESHOLD AMPLITUDE: 1.375 V
MDC IDC MSMT LEADCHNL LV PACING THRESHOLD PULSEWIDTH: 0.6 ms
MDC IDC MSMT LEADCHNL RV IMPEDANCE VALUE: 530 Ohm
MDC IDC PG MODEL: 3242
Pulse Gen Serial Number: 7548835

## 2015-05-31 NOTE — Telephone Encounter (Signed)
Unable to reach patient for ICM follow up.  Rescheduled for 06/09/2015.

## 2015-05-31 NOTE — Telephone Encounter (Signed)
Received voice mail message from patient to return call.  Call to patient for ICM. Explained an updated transmission would need to be sent today if she will be at home for a call back.  She stated she probably will not be at home for rest of the day.  Advised rescheduled next ICM transmission 10/50/2016 and she agreed.

## 2015-06-01 DIAGNOSIS — Z23 Encounter for immunization: Secondary | ICD-10-CM | POA: Diagnosis not present

## 2015-06-04 ENCOUNTER — Encounter: Payer: Self-pay | Admitting: Cardiology

## 2015-06-07 ENCOUNTER — Other Ambulatory Visit: Payer: Self-pay

## 2015-06-07 MED ORDER — FUROSEMIDE 40 MG PO TABS: 20.0000 mg | ORAL_TABLET | Freq: Every day | ORAL | Status: DC

## 2015-06-07 MED ORDER — WARFARIN SODIUM 2.5 MG PO TABS: ORAL_TABLET | ORAL | Status: DC

## 2015-06-07 NOTE — Telephone Encounter (Signed)
Anita Bjornstad, MD at 05/26/2015 5:25 PM  furosemide (LASIX) 40 MG tabletTake 0.5 tablets (20 mg total) by mouth daily Medication Instructions:  Same-no changes

## 2015-06-09 ENCOUNTER — Ambulatory Visit (INDEPENDENT_AMBULATORY_CARE_PROVIDER_SITE_OTHER): Payer: Medicare Other

## 2015-06-09 DIAGNOSIS — Z950 Presence of cardiac pacemaker: Secondary | ICD-10-CM

## 2015-06-09 NOTE — Progress Notes (Addendum)
EPIC Encounter for ICM Monitoring  Patient Name: Anita Duke is a 74 y.o. female Date: 06/09/2015 Primary Care Physican: Alesia Richards, MD Primary Cardiologist: Ron Parker Electrophysiologist: Allred Dry Weight: Last office weight 178 lb on 05/26/2015       Bi-V Pacing 98%  In the past month, have you:  1. Gained more than 2 pounds in a day or more than 5 pounds in a week? She does not weigh consistently.  Explained importance of weighing daily and report changes to office  2. Had changes in your medications (with verification of current medications)? no  3. Had more shortness of breath than is usual for you? no  4. Limited your activity because of shortness of breath? no  5. Not been able to sleep because of shortness of breath? no  6. Had increased swelling in your feet or ankles? Yes when she missed taking fluid pills but has now resolved.   7. Had symptoms of dehydration (dizziness, dry mouth, increased thirst, decreased urine output) no  8. Had changes in sodium restriction? On vacation, ate foods higher in sodium than normal.   9. Been compliant with medication? No,  She does not take her fluid pills until later in the day if she has to leave the house and then forgets when she comes home.  Explained importance of taking meds and not missing doses   ICM trend:   Follow-up plan: ICM clinic phone appointment 07/12/2015. CorVue impedance below baseline ~05/29/2015 to 06/02/2015 and 06/04/2015 to 06/07/2015.  She reported no problems or concerns at this time.  No changes today.   She reported she will be switching to Dr Irish Lack in the future due to Dr Ron Parker retired.   Copy of note sent to patient's primary care physician, primary cardiologist, and device following physician.  [name], RN, CCM 06/09/2015 10:33 AM

## 2015-06-11 ENCOUNTER — Ambulatory Visit (INDEPENDENT_AMBULATORY_CARE_PROVIDER_SITE_OTHER): Payer: Medicare Other | Admitting: Internal Medicine

## 2015-06-11 ENCOUNTER — Encounter: Payer: Self-pay | Admitting: Internal Medicine

## 2015-06-11 VITALS — BP 128/76 | HR 72 | Temp 98.0°F | Resp 16 | Ht 62.5 in | Wt 176.0 lb

## 2015-06-11 DIAGNOSIS — I482 Chronic atrial fibrillation, unspecified: Secondary | ICD-10-CM

## 2015-06-11 DIAGNOSIS — E119 Type 2 diabetes mellitus without complications: Secondary | ICD-10-CM

## 2015-06-11 DIAGNOSIS — E2839 Other primary ovarian failure: Secondary | ICD-10-CM

## 2015-06-11 DIAGNOSIS — E782 Mixed hyperlipidemia: Secondary | ICD-10-CM | POA: Diagnosis not present

## 2015-06-11 DIAGNOSIS — I10 Essential (primary) hypertension: Secondary | ICD-10-CM

## 2015-06-11 DIAGNOSIS — E559 Vitamin D deficiency, unspecified: Secondary | ICD-10-CM | POA: Diagnosis not present

## 2015-06-11 DIAGNOSIS — Z7901 Long term (current) use of anticoagulants: Secondary | ICD-10-CM

## 2015-06-11 DIAGNOSIS — Z79899 Other long term (current) drug therapy: Secondary | ICD-10-CM | POA: Diagnosis not present

## 2015-06-11 LAB — HEPATIC FUNCTION PANEL
ALBUMIN: 4.1 g/dL (ref 3.6–5.1)
ALK PHOS: 75 U/L (ref 33–130)
ALT: 21 U/L (ref 6–29)
AST: 21 U/L (ref 10–35)
BILIRUBIN TOTAL: 1 mg/dL (ref 0.2–1.2)
Bilirubin, Direct: 0.2 mg/dL (ref <=0.2)
Indirect Bilirubin: 0.8 mg/dL (ref 0.2–1.2)
Total Protein: 6.4 g/dL (ref 6.1–8.1)

## 2015-06-11 LAB — CBC WITH DIFFERENTIAL/PLATELET
BASOS ABS: 0.1 10*3/uL (ref 0.0–0.1)
Basophils Relative: 1 % (ref 0–1)
Eosinophils Absolute: 0.1 10*3/uL (ref 0.0–0.7)
Eosinophils Relative: 1 % (ref 0–5)
HEMATOCRIT: 42 % (ref 36.0–46.0)
HEMOGLOBIN: 14.1 g/dL (ref 12.0–15.0)
LYMPHS ABS: 1.9 10*3/uL (ref 0.7–4.0)
LYMPHS PCT: 32 % (ref 12–46)
MCH: 29.5 pg (ref 26.0–34.0)
MCHC: 33.6 g/dL (ref 30.0–36.0)
MCV: 87.9 fL (ref 78.0–100.0)
MONOS PCT: 10 % (ref 3–12)
MPV: 10.6 fL (ref 8.6–12.4)
Monocytes Absolute: 0.6 10*3/uL (ref 0.1–1.0)
NEUTROS ABS: 3.4 10*3/uL (ref 1.7–7.7)
NEUTROS PCT: 56 % (ref 43–77)
PLATELETS: 208 10*3/uL (ref 150–400)
RBC: 4.78 MIL/uL (ref 3.87–5.11)
RDW: 13.8 % (ref 11.5–15.5)
WBC: 6 10*3/uL (ref 4.0–10.5)

## 2015-06-11 LAB — BASIC METABOLIC PANEL WITH GFR
BUN: 12 mg/dL (ref 7–25)
CHLORIDE: 104 mmol/L (ref 98–110)
CO2: 28 mmol/L (ref 20–31)
CREATININE: 0.49 mg/dL — AB (ref 0.60–0.93)
Calcium: 8.8 mg/dL (ref 8.6–10.4)
GFR, Est African American: >89 mL/min (ref >=60)
GFR, Est Non African American: >89 mL/min (ref >=60)
GLUCOSE: 134 mg/dL — AB (ref 65–99)
POTASSIUM: 4.1 mmol/L (ref 3.5–5.3)
Sodium: 141 mmol/L (ref 135–146)

## 2015-06-11 LAB — MAGNESIUM: Magnesium: 1.7 mg/dL (ref 1.5–2.5)

## 2015-06-11 LAB — LIPID PANEL
Cholesterol: 145 mg/dL (ref 125–200)
HDL: 44 mg/dL — AB (ref >=46)
LDL CALC: 52 mg/dL (ref <130)
Total CHOL/HDL Ratio: 3.3 Ratio (ref <=5.0)
Triglycerides: 244 mg/dL — ABNORMAL HIGH (ref <150)
VLDL: 49 mg/dL — ABNORMAL HIGH (ref <30)

## 2015-06-11 LAB — HEMOGLOBIN A1C
HEMOGLOBIN A1C: 7.9 % — AB (ref <5.7)
MEAN PLASMA GLUCOSE: 180 mg/dL — AB (ref <117)

## 2015-06-11 MED ORDER — TRANDOLAPRIL 4 MG PO TABS: ORAL_TABLET | ORAL | Status: DC

## 2015-06-11 NOTE — Progress Notes (Signed)
Patient ID: Anita Duke, female   DOB: 05-Jun-1941, 74 y.o.   MRN: [id number]  Assessment and Plan:  Hypertension:  -Continue medication -monitor blood pressure at home. -Continue DASH diet -Reminder to go to the ER if any CP, SOB, nausea, dizziness, severe HA, changes vision/speech, left arm numbness and tingling and jaw pain.  Cholesterol - Continue diet and exercise -Check cholesterol.   Diabetes without complications  -metformin giving her diarrhea will stop and try januvia samples -recheck in 6 weeks and if she likes it will send to the pharmacy. -Continue diet and exercise.  -Check A1C  Vitamin D Def -check level -continue medications.   Continue diet and meds as discussed. Further disposition pending results of labs. Discussed med's effects and SE's.    HPI 74 y.o. female  presents for 3 month follow up with hypertension with history of CABG, chronic a fib on anticoagulation monitored by Cards, hyperlipidemia, diabetes and vitamin D deficiency.   Her blood pressure has been controlled at home, today their BP is BP: 128/76 mmHg.She does not workout. She denies chest pain, shortness of breath, dizziness.   She is on cholesterol medication and denies myalgias. Her cholesterol is at goal. The cholesterol was:  03/11/2015: Cholesterol 151; HDL 54; LDL Cholesterol 59; Triglycerides 192*   She has been working on diet and exercise for diabetes without complications, she is on bASA, she is on ACE/ARB, and denies  foot ulcerations, hyperglycemia, hypoglycemia , increased appetite, nausea, paresthesia of the feet, polydipsia, polyuria, visual disturbances, vomiting and weight loss. Last A1C was: 03/11/2015: Hgb A1c MFr Bld 7.5*   Patient is on Vitamin D supplement. 03/11/2015: Vit D, 25-Hydroxy 56  Patient reports that she does have a history of diarrhea with metformin.  She reports that it is really hard for her to take it due to the issues with diarrhea.    She does report that she  has had her flu shot a week ago.      Current Medications:  Current Outpatient Prescriptions on File Prior to Visit  Medication Sig Dispense Refill  . acetaminophen (TYLENOL) 500 MG tablet Take 1,000 mg by mouth every 6 (six) hours as needed. For pain    . atorvastatin (LIPITOR) 40 MG tablet Take 1 tablet by mouth  daily 90 tablet 3  . calcium carbonate (OS-CAL) 600 MG TABS Take 600 mg by mouth daily.     . carvedilol (COREG) 6.25 MG tablet Take 1 tablet by mouth  twice a day with meals 180 tablet 1  . cholecalciferol (VITAMIN D) 1000 UNITS tablet Take 6,000 Units by mouth daily.     . fish oil-omega-3 fatty acids 1000 MG capsule Take 1 g by mouth 2 (two) times daily.     . furosemide (LASIX) 40 MG tablet Take 0.5 tablets (20 mg total) by mouth daily. 90 tablet 1  . loperamide (IMODIUM) 2 MG capsule Take 1 capsule (2 mg total) by mouth daily as needed for diarrhea or loose stools. 30 capsule 10  . loratadine (CLARITIN) 10 MG tablet Take 10 mg by mouth daily.    . metFORMIN (GLUCOPHAGE XR) 500 MG 24 hr tablet Take 1 to 2 tablets 2 x day for Diabetes (Patient taking differently: Take 500 mg by mouth 2 (two) times daily. Take 1 to 2 tablets 2 x day for Diabetes) 360 tablet 99  . potassium chloride SA (KLOR-CON M20) 20 MEQ tablet Take 3 tablets (60 mEq total) by mouth daily. 90 tablet 3  .  trandolapril (MAVIK) 4 MG tablet Take 1 tablet by mouth two  times daily for blood  pressure 180 tablet 3  . venlafaxine XR (EFFEXOR-XR) 37.5 MG 24 hr capsule Take 1 capsule by mouth  every evening 90 capsule 1  . warfarin (COUMADIN) 2.5 MG tablet Take as directed by Anticoagulation Clinic 100 tablet 1   No current facility-administered medications on file prior to visit.   Medical History:  Past Medical History  Diagnosis Date  . Mitral regurgitation     Treated with repair, atrial appendage was removed or tied off at surgery.. this was proven by TEE February, 2011  . Pulmonary HTN ([place])     55 mmHg,  echo, February, 2011 / no mention of pulmonary hypertension echo, June, 2011  . Right ventricular dysfunction     Mild to moderate, echo, February, 2011 / normalized echo, June, 2011  . TR (tricuspid regurgitation)     Moderate, echo, February, 2011 / trivial, echo, June, 2011  . Mitral stenosis     Mild, February, 2011, post mitral valve repair / mild, echo, June, 2011  . CAD (coronary artery disease)     LIMA to the LAD at time of mitral valve repair / LIMA atretic,, February, 2011  . Ejection fraction < 50%     EF 30% echo and TEE diagnosis February, 2011, possibly rate related / no contraction by catheter, bradycardia so carvedilol cannot be used / EF 55-60% echo, June, 2011 / EF 50%, echo, February, 2012  . Hemorrhoids, internal   . Diverticulosis of colon   . Colon polyp, hyperplastic   . H/O amiodarone therapy     Satrted 02/12/2012  . High cholesterol   . Asthma   . COPD (chronic obstructive pulmonary disease) ([place])   . HTN (hypertension)     takes Mavik daily  . Myocardial infarction [facility]) 04/2012  ?  . CHF (congestive heart failure) ([place]) 2011    Mild in hospital February, 2011;takes Lasix daily  . Seasonal allergies     takes Claritin daily  . Type II diabetes mellitus ([facility])     takes Metformin daily  . Cataracts, bilateral     immature  . Depression     takes Effexor daily  . Atrial fibrillation ([place])     Treated with maze procedure / recurrent atrial fibrillation February, 2011... TEE cardioversion October 19, 2009; s/p AVN ablatin  . QT prolongation     Tikosyn and Effexor. QT prolonged October 13, 2011, peak is in dose reduced from 500  to -250 twice a day  . Anemia     Hemoglobin 10.4, December, 2013  . GERD (gastroesophageal reflux disease)   . Arthritis     "not bad; little in my hands; some in my knees" (11/04/2013)  . 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 Demerol [Meperidine] Nausea And Vomiting  . Morphine Other (See  Comments)    vomiting  . Tetanus Toxoid Rash    "years ago"     Review of Systems:  ROS  Family history- Review and unchanged  Social history- Review and unchanged  Physical Exam: BP 128/76 mmHg  Pulse 72  Temp(Src) 98 F (36.7 C) (Temporal)  Resp 16  Ht 5' 2.5" (1.588 m)  Wt 176 lb (79.833 kg)  BMI 31.66 kg/m2 Wt Readings from Last 3 Encounters:  06/11/15 176 lb (79.833 kg)  05/26/15 178 lb (80.74 kg)  03/11/15 173 lb 3.2 oz (78.563  kg)   General Appearance: Well nourished well developed, non-toxic appearing, in no apparent distress. Eyes: PERRLA, EOMs, conjunctiva no swelling or erythema ENT/Mouth: Ear canals clear with no erythema, swelling, or discharge.  TMs normal bilaterally, oropharynx clear, moist, with no exudate.   Neck: Supple, thyroid normal, no JVD, no cervical adenopathy.  Respiratory: Respiratory effort normal, breath sounds clear A&P, no wheeze, rhonchi or rales noted.  No retractions, no accessory muscle usage Cardio: RRR with no MRGs. No noted edema.  Abdomen: Soft, + BS.  Non tender, no guarding, rebound, hernias, masses. Musculoskeletal: Full ROM, 5/5 strength, Normal gait Skin: Warm, dry without rashes, lesions, ecchymosis.  Neuro: Awake and oriented X 3, Cranial nerves intact. No cerebellar symptoms.  Psych: normal affect, Insight and Judgment appropriate.    Starlyn Skeans, PA-C 10:40 AM [facility]

## 2015-06-11 NOTE — Patient Instructions (Signed)
Sitagliptin oral tablet °What is this medicine? °SITAGLIPTIN (sit a GLIP tin) helps to treat type 2 diabetes. It helps to control blood sugar. Treatment is combined with diet and exercise. °This medicine may be used for other purposes; ask your health care provider or pharmacist if you have questions. °What should I tell my health care provider before I take this medicine? °They need to know if you have any of these conditions: °-diabetic ketoacidosis °-kidney disease °-pancreatitis °-previous swelling of the tongue, face, or lips with difficulty breathing, difficulty swallowing, hoarseness, or tightening of the throat °-type 1 diabetes °-an unusual or allergic reaction to sitagliptin, other medicines, foods, dyes, or preservatives °-pregnant or trying to get pregnant °-breast-feeding °How should I use this medicine? °Take this medicine by mouth with a glass of water. Follow the directions on the prescription label. You can take it with or without food. Do not cut, crush or chew this medicine. Take your dose at the same time each day. Do not take more often than directed. Do not stop taking except on your doctor's advice. °Talk to your pediatrician regarding the use of this medicine in children. Special care may be needed. °Overdosage: If you think you have taken too much of this medicine contact a poison control center or emergency room at once. °NOTE: This medicine is only for you. Do not share this medicine with others. °What if I miss a dose? °If you miss a dose, take it as soon as you can. If it is almost time for your next dose, take only that dose. Do not take double or extra doses. °What may interact with this medicine? °Do not take this medicine with any of the following medications: °-gatifloxacin °This medicine may also interact with the following medications: °-alcohol °-digoxin °-insulin °-sulfonylureas like glimepiride, glipizide, glyburide °This list may not describe all possible interactions. Give  your health care provider a list of all the medicines, herbs, non-prescription drugs, or dietary supplements you use. Also tell them if you smoke, drink alcohol, or use illegal drugs. Some items may interact with your medicine. °What should I watch for while using this medicine? °Visit your doctor or health care professional for regular checks on your progress. °A test called the HbA1C (A1C) will be monitored. This is a simple blood test. It measures your blood sugar control over the last 2 to 3 months. You will receive this test every 3 to 6 months. °Learn how to check your blood sugar. Learn the symptoms of low and high blood sugar and how to manage them. °Always carry a quick-source of sugar with you in case you have symptoms of low blood sugar. Examples include hard sugar candy or glucose tablets. Make sure others know that you can choke if you eat or drink when you develop serious symptoms of low blood sugar, such as seizures or unconsciousness. They must get medical help at once. °Tell your doctor or health care professional if you have high blood sugar. You might need to change the dose of your medicine. If you are sick or exercising more than usual, you might need to change the dose of your medicine. °Do not skip meals. Ask your doctor or health care professional if you should avoid alcohol. Many nonprescription cough and cold products contain sugar or alcohol. These can affect blood sugar. °Wear a medical ID bracelet or chain, and carry a card that describes your disease and details of your medicine and dosage times. °What side effects may I notice   from receiving this medicine? °Side effects that you should report to your doctor or health care professional as soon as possible: °-allergic reactions like skin rash, itching or hives, swelling of the face, lips, or tongue °-breathing problems °-fever, chills °-joint pain °-loss of appetite °-signs and symptoms of low blood sugar such as feeling anxious,  confusion, dizziness, increased hunger, unusually weak or tired, sweating, shakiness, cold, irritable, headache, blurred vision, fast heartbeat, loss of consciousness °-unusual stomach pain or discomfort °-vomiting °Side effects that usually do not require medical attention (report to your doctor or health care professional if they continue or are bothersome): °-diarrhea °-headache °-sore throat °-stomach upset °-stuffy or runny nose °This list may not describe all possible side effects. Call your doctor for medical advice about side effects. You may report side effects to FDA at 1-800-FDA-1088. °Where should I keep my medicine? °Keep out of the reach of children. °Store at room temperature between 15 and 30 degrees C (59 and 86 degrees F). Throw away any unused medicine after the expiration date. °NOTE: This sheet is a summary. It may not cover all possible information. If you have questions about this medicine, talk to your doctor, pharmacist, or health care provider. °  °© 2016, [employer]. (2014-05-01 17:46:21) ° °

## 2015-06-12 LAB — TSH: TSH: 0.581 u[IU]/mL (ref 0.350–4.500)

## 2015-06-12 LAB — INSULIN, RANDOM: Insulin: 6.1 u[IU]/mL (ref 2.0–19.6)

## 2015-06-12 LAB — VITAMIN D 25 HYDROXY (VIT D DEFICIENCY, FRACTURES): VIT D 25 HYDROXY: 58 ng/mL (ref 30–100)

## 2015-06-16 ENCOUNTER — Encounter: Payer: Self-pay | Admitting: Internal Medicine

## 2015-06-21 ENCOUNTER — Encounter: Payer: Self-pay | Admitting: Cardiology

## 2015-07-07 ENCOUNTER — Ambulatory Visit (INDEPENDENT_AMBULATORY_CARE_PROVIDER_SITE_OTHER): Payer: Medicare Other | Admitting: *Deleted

## 2015-07-07 DIAGNOSIS — I4891 Unspecified atrial fibrillation: Secondary | ICD-10-CM

## 2015-07-07 DIAGNOSIS — Z5181 Encounter for therapeutic drug level monitoring: Secondary | ICD-10-CM | POA: Diagnosis not present

## 2015-07-07 LAB — POCT INR: INR: 2.7

## 2015-07-12 ENCOUNTER — Ambulatory Visit (INDEPENDENT_AMBULATORY_CARE_PROVIDER_SITE_OTHER): Payer: Medicare Other

## 2015-07-12 DIAGNOSIS — Z950 Presence of cardiac pacemaker: Secondary | ICD-10-CM | POA: Diagnosis not present

## 2015-07-12 DIAGNOSIS — I509 Heart failure, unspecified: Secondary | ICD-10-CM | POA: Diagnosis not present

## 2015-07-12 NOTE — Progress Notes (Signed)
EPIC Encounter for ICM Monitoring  Patient Name: Anita Duke is a 74 y.o. female Date: 07/12/2015 Primary Care Physican: Alesia Richards, MD Primary Cardiologist: Ron Parker Electrophysiologist: Allred Dry Weight: Unknown - does not weigh at home, no scale       In the past month, have you:  1. Gained more than 2 pounds in a day or more than 5 pounds in a week? Unknown.  Encouraged to weigh to monitor for fluid retention  2. Had changes in your medications (with verification of current medications)? no  3. Had more shortness of breath than is usual for you? no  4. Limited your activity because of shortness of breath? no  5. Not been able to sleep because of shortness of breath? no  6. Had increased swelling in your feet or ankles? no  7. Had symptoms of dehydration (dizziness, dry mouth, increased thirst, decreased urine output) no  8. Had changes in sodium restriction? no  9. Been compliant with medication? Yes   ICM trend: 07/12/2015   Follow-up plan: ICM clinic phone appointment on 08/12/2015.  Corvue impedance below baseline 06/25/2015 to 07/02/2015 suggesting fluid retention and she denied any HF symptoms.  Reviewed causes of fluid retention such as increase in foods high in sodium and/or fluid intake, missing medication.  She denied any changes in routine.  Advised general recommendation for HF patient is limit fluid intake to no more than 64 oz and salt intake to 2000mg  daily.  She stated she follows low sodium diet.  Impedance above baseline ~07/08/2015 to 07/12/2015 that may suggest dryness and advised maintain enough fluid intake to stay hydrated.  No changes today.   Copy of note sent to patient's primary care physician, primary cardiologist, and device following physician.  [name], RN, CCM 07/12/2015 1:47 PM

## 2015-07-23 ENCOUNTER — Ambulatory Visit: Payer: Self-pay | Admitting: Internal Medicine

## 2015-08-12 ENCOUNTER — Ambulatory Visit (INDEPENDENT_AMBULATORY_CARE_PROVIDER_SITE_OTHER): Payer: Medicare Other

## 2015-08-12 DIAGNOSIS — I509 Heart failure, unspecified: Secondary | ICD-10-CM | POA: Diagnosis not present

## 2015-08-12 DIAGNOSIS — Z950 Presence of cardiac pacemaker: Secondary | ICD-10-CM | POA: Diagnosis not present

## 2015-08-13 NOTE — Progress Notes (Signed)
EPIC Encounter for ICM Monitoring  Patient Name: Anita Duke is a 74 y.o. female Date: 08/13/2015 Primary Care Physican: Alesia Richards, MD Primary Cardiologist: Ron Parker Electrophysiologist: Allred Dry Weight: Does not weigh   Bi-V Pacing 98%       In the past month, have you:  1. Gained more than 2 pounds in a day or more than 5 pounds in a week? Unknown  2. Had changes in your medications (with verification of current medications)? no  3. Had more shortness of breath than is usual for you? no  4. Limited your activity because of shortness of breath? no  5. Not been able to sleep because of shortness of breath? no  6. Had increased swelling in your feet or ankles? no  7. Had symptoms of dehydration (dizziness, dry mouth, increased thirst, decreased urine output) no  8. Had changes in sodium restriction? no  9. Been compliant with medication? Yes   ICM trend:   Follow-up plan: ICM clinic phone appointment on 09/14/2015.  Corvue daily impedance below baseline 07/17/2015 to 07/21/2015 and 08/03/2015 and 08/06/2015 suggesting fluid retention.  She stated she missed 2 doses of Furosemide between 11/29 and 12/2.  She denied any HF symptoms at this time.  No changes today.     Copy of note sent to patient's primary care physician, primary cardiologist, and device following physician.  [name], RN, CCM 08/13/2015 12:38 PM

## 2015-08-18 ENCOUNTER — Ambulatory Visit (INDEPENDENT_AMBULATORY_CARE_PROVIDER_SITE_OTHER): Payer: Medicare Other | Admitting: *Deleted

## 2015-08-18 DIAGNOSIS — Z5181 Encounter for therapeutic drug level monitoring: Secondary | ICD-10-CM | POA: Diagnosis not present

## 2015-08-18 DIAGNOSIS — I4891 Unspecified atrial fibrillation: Secondary | ICD-10-CM | POA: Diagnosis not present

## 2015-08-18 LAB — POCT INR: INR: 3.2

## 2015-08-23 DIAGNOSIS — H2513 Age-related nuclear cataract, bilateral: Secondary | ICD-10-CM | POA: Diagnosis not present

## 2015-08-23 DIAGNOSIS — H524 Presbyopia: Secondary | ICD-10-CM | POA: Diagnosis not present

## 2015-08-23 DIAGNOSIS — E119 Type 2 diabetes mellitus without complications: Secondary | ICD-10-CM | POA: Diagnosis not present

## 2015-08-23 LAB — HM DIABETES EYE EXAM

## 2015-08-26 ENCOUNTER — Encounter: Payer: Self-pay | Admitting: Internal Medicine

## 2015-08-31 ENCOUNTER — Ambulatory Visit: Payer: Medicare Other

## 2015-09-05 HISTORY — PX: CATARACT EXTRACTION, BILATERAL: SHX1313

## 2015-09-07 ENCOUNTER — Ambulatory Visit: Payer: Medicare Other | Admitting: Internal Medicine

## 2015-09-08 ENCOUNTER — Other Ambulatory Visit: Payer: Self-pay | Admitting: Internal Medicine

## 2015-09-08 ENCOUNTER — Encounter: Payer: Self-pay | Admitting: Internal Medicine

## 2015-09-09 ENCOUNTER — Telehealth: Payer: Self-pay

## 2015-09-09 NOTE — Telephone Encounter (Signed)
ICM call to patient.  Advised would reschedule ICM transmission for 10/13/2015 and if she needs to change that let me know.    Received voice mail message from patient stating she would not be home for the scheduled transmission on 09/14/2015 and plans on being away from home for the month of January.

## 2015-09-15 ENCOUNTER — Encounter: Payer: Self-pay | Admitting: Internal Medicine

## 2015-09-27 ENCOUNTER — Telehealth: Payer: Self-pay | Admitting: Gastroenterology

## 2015-09-28 NOTE — Telephone Encounter (Signed)
Yes, happy to.  

## 2015-09-29 ENCOUNTER — Encounter: Payer: Self-pay | Admitting: Internal Medicine

## 2015-09-29 ENCOUNTER — Ambulatory Visit (INDEPENDENT_AMBULATORY_CARE_PROVIDER_SITE_OTHER): Payer: Medicare Other | Admitting: Internal Medicine

## 2015-09-29 VITALS — BP 172/94 | HR 72 | Temp 97.8°F | Resp 18 | Ht 62.5 in | Wt 174.0 lb

## 2015-09-29 DIAGNOSIS — J069 Acute upper respiratory infection, unspecified: Secondary | ICD-10-CM | POA: Diagnosis not present

## 2015-09-29 MED ORDER — PROMETHAZINE-DM 6.25-15 MG/5ML PO SYRP: ORAL_SOLUTION | ORAL | Status: DC

## 2015-09-29 MED ORDER — AZITHROMYCIN 250 MG PO TABS: ORAL_TABLET | ORAL | Status: DC

## 2015-09-29 MED ORDER — PREDNISONE 20 MG PO TABS: ORAL_TABLET | ORAL | Status: DC

## 2015-09-29 NOTE — Patient Instructions (Signed)
Please take prednisone until it is completely gone even if you feel better.  Please take the zpak until it is all the way gone.  Please cut your coumadin dose in half while you are taking your zpak.  Please use saline in your nose as often as possible to help clear up your nose.  Continue taking your claritin daily.  Please make sure you are using flonase spray 2 sprays per nostril right before bedtime.    Take cough syrup as needed up to 3 times daily.

## 2015-09-29 NOTE — Progress Notes (Signed)
Patient ID: Anita Duke, female   DOB: [birth year], 75 y.o.   MRN: [id number]  HPI  Patient presents to the office for evaluation of cough.  It has been going on for 4 days.  Patient reports night > day, dry, worse with lying down.  They also endorse change in voice, postnasal drip and nasal congestion, rhinorrhea, sore throat, ear pressure, .  They have tried antitussives.  They report that nothing has worked.  They denies other sick contacts.  She did try one dose of dayquil and she is not supposed to be taking this with her coumadin.    She reports that she hasn't taken her blood pressure medication this morning or yesterday evening.    Review of Systems  Constitutional: Negative for fever, chills and malaise/fatigue.  HENT: Positive for congestion and sore throat. Negative for ear pain.   Respiratory: Positive for cough, shortness of breath and wheezing.   Cardiovascular: Negative for chest pain, palpitations and leg swelling.  Skin: Negative.   Neurological: Negative for headaches.    PE:  Filed Vitals:   09/29/15 0956  BP: 172/94  Pulse: 72  Temp: 97.8 F (36.6 C)  Resp: 18    General:  Alert and non-toxic, WDWN, NAD HEENT: NCAT, PERLA, EOM normal, no occular discharge or erythema.  Nasal mucosal edema with sinus tenderness to palpation.  Oropharynx clear with minimal oropharyngeal edema and erythema.  Mucous membranes moist and pink. Neck:  Cervical adenopathy Chest:  RRR no MRGs.  Lungs clear to auscultation A&P with no wheezes rhonchi or rales.   Abdomen: +BS x 4 quadrants, soft, non-tender, no guarding, rigidity, or rebound. Skin: warm and dry no rash Neuro: A&Ox4, CN II-XII grossly intact  Assessment and Plan:   1. Acute URI -nasal saline -flonase -claritin -cut coumadin in half while on abx - azithromycin (ZITHROMAX Z-PAK) 250 MG tablet; 2 po day one, then 1 daily x 4 days  Dispense: 6 tablet; Refill: 0 - promethazine-dextromethorphan (PROMETHAZINE-DM) 6.25-15  MG/5ML syrup; Take 5-10 mL PO q8hrs prn for cold symptoms  Dispense: 360 mL; Refill: 1 - predniSONE (DELTASONE) 20 MG tablet; 3 tabs po daily x 3 days, then 2 tabs x 3 days, then 1.5 tabs x 3 days, then 1 tab x 3 days, then 0.5 tabs x 3 days  Dispense: 27 tablet; Refill: 0

## 2015-10-01 ENCOUNTER — Telehealth: Payer: Self-pay | Admitting: *Deleted

## 2015-10-01 NOTE — Telephone Encounter (Signed)
Left message to call clinic regarding need to be seen today

## 2015-10-01 NOTE — Telephone Encounter (Signed)
Spoke with pt and she states on Wednesday Jan 25th   started Zpak and Prednisone 20 mg 3 tabs for 3 days then 2 tabs for 3 days  then 1 and 1/2 tablets for 3 days the 1 tablet for 3 days and 1/2 tablet for 3 days Has an appt to be  seen today and she states she does not think will be able to keep that appt Stressed to her importance of having INR checked today and she states she will try to keep her appt and she will call back to let us know

## 2015-10-07 NOTE — Telephone Encounter (Signed)
Spoke w/pt and she will CB to schedule an appt

## 2015-10-08 ENCOUNTER — Encounter: Payer: Self-pay | Admitting: Internal Medicine

## 2015-10-08 ENCOUNTER — Ambulatory Visit (INDEPENDENT_AMBULATORY_CARE_PROVIDER_SITE_OTHER): Payer: Medicare Other | Admitting: General Practice

## 2015-10-08 ENCOUNTER — Ambulatory Visit (INDEPENDENT_AMBULATORY_CARE_PROVIDER_SITE_OTHER): Payer: Medicare Other | Admitting: Internal Medicine

## 2015-10-08 VITALS — BP 154/80 | HR 71 | Temp 98.2°F | Resp 16 | Wt 175.0 lb

## 2015-10-08 DIAGNOSIS — Z5181 Encounter for therapeutic drug level monitoring: Secondary | ICD-10-CM | POA: Insufficient documentation

## 2015-10-08 DIAGNOSIS — R6889 Other general symptoms and signs: Secondary | ICD-10-CM | POA: Diagnosis not present

## 2015-10-08 DIAGNOSIS — I4891 Unspecified atrial fibrillation: Secondary | ICD-10-CM | POA: Diagnosis not present

## 2015-10-08 DIAGNOSIS — E782 Mixed hyperlipidemia: Secondary | ICD-10-CM

## 2015-10-08 DIAGNOSIS — C180 Malignant neoplasm of cecum: Secondary | ICD-10-CM

## 2015-10-08 DIAGNOSIS — E119 Type 2 diabetes mellitus without complications: Secondary | ICD-10-CM | POA: Diagnosis not present

## 2015-10-08 DIAGNOSIS — I10 Essential (primary) hypertension: Secondary | ICD-10-CM | POA: Diagnosis not present

## 2015-10-08 LAB — POCT INR: INR: 1.2

## 2015-10-08 MED ORDER — BLOOD GLUCOSE MONITOR KIT: PACK | Status: DC

## 2015-10-08 MED ORDER — SITAGLIPTIN PHOSPHATE 100 MG PO TABS: 100.0000 mg | ORAL_TABLET | Freq: Every day | ORAL | Status: DC

## 2015-10-08 MED ORDER — DIPHENHYDRAMINE-APAP (SLEEP) 25-500 MG PO TABS: 1.0000 | ORAL_TABLET | Freq: Every evening | ORAL | Status: DC | PRN

## 2015-10-08 NOTE — Progress Notes (Signed)
I have reviewed and agree with the plan. 

## 2015-10-08 NOTE — Assessment & Plan Note (Signed)
Lab Results  Component Value Date   HGBA1C 7.9* 06/11/2015   She is not taking metformin-does not tolerate it secondary to diarrhea Start Januvia if it is not too expensive Stressed the importance of increasing exercise, doing better with her compliance with a diabetic diet and losing weight-stressed that improving her lifestyle will decrease the amount of medication she needs to be on Check A1c today Follow up in 3 months and we'll recheck A1c at that time

## 2015-10-08 NOTE — Progress Notes (Signed)
Pre visit review using our clinic review tool, if applicable. No additional management support is needed unless otherwise documented below in the visit note.  INR is low today. Dosage was cut in half due to patient taking antibiotics and prednisone.  Today I boosted patient for 2 days and resumed current dosage.  Patient will be re-checked in 1 week.

## 2015-10-08 NOTE — Assessment & Plan Note (Signed)
Taking atorvastatin 40 mg daily Check lipid panel, CMP Encouraged increasing her activity and improving her diet

## 2015-10-08 NOTE — Patient Instructions (Addendum)
  We have reviewed your prior records including labs and tests today.  Test(s) ordered today. Your results will be released to [place] (or called to you) after review, usually within 72hours after test completion. If any changes need to be made, you will be notified at that same time.   Medications reviewed and updated.  Changes include adding Januvia for your diabetes.   Your prescription(s) have been submitted to your pharmacy. Please take as directed and contact our office if you believe you are having problem(s) with the medication(s).   Please schedule followup in 3 months

## 2015-10-08 NOTE — Assessment & Plan Note (Signed)
BP Readings from Last 3 Encounters:  10/08/15 154/80  09/29/15 172/94  06/11/15 128/76   She feels her blood pressure is typically better controlled, but elevated at the doctor's office Should ideally monitor regularly Continue current medications for now

## 2015-10-08 NOTE — Assessment & Plan Note (Signed)
Possibly related to the thyroid-check TFTs May also be related to her Effexor

## 2015-10-08 NOTE — Progress Notes (Signed)
Subjective:    Patient ID: Anita Duke, female    DOB: 1941/01/05, 75 y.o.   MRN: [id number]  HPI She is here to establish with a new pcp.   Sweating, hot intolerance:  She has been hot for about two years.  She sweats all the time.  She is miserable in the summer. At one point she had a low TSH, but when it was rechecked it was normal. She would like to have her thyroid rechecked to see if that is the cause of her sweating. She has been on her current Effexor dose from time. She denies any medications that might correlate with when her symptoms started.  Diabetes: She is not taking her medication daily as prescribed. She has not been been able to tolerate the metformin because it causes diarrhea.  She is not compliant with a diabetic diet. She is exercising regularly - walking her dog a few times a day. She does not monitor her sugars.  She checks her feet daily and denies foot lesions. She is up-to-date with an ophthalmology examination.   Hyperlipidemia: She is taking her medication daily. She is not compliant with a low fat/cholesterol diet. She is exercising regularly - walks her dog a few times a day. She denies myalgias.   Hypertension: She is following with cardiology.  She is taking her medication daily. She is not compliant with a low sodium diet.  She denies chest pain, palpitations, edema, shortness of breath and regular headaches. She is exercising regularly - she walks her dog.  She does not monitor her blood pressure at home.      Medications and allergies reviewed with patient and updated if appropriate.  Patient Active Problem List   Diagnosis Date Noted  . Encounter for therapeutic drug monitoring 10/08/2015  . Obesity 12/02/2014  . CAD (coronary artery disease) 05/28/2014  . Vitamin D deficiency 02/20/2014  . Medication management 02/20/2014  . Long term current use of anticoagulant therapy 11/24/2013  . Chronic systolic dysfunction of left ventricle 05/21/2013    . 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 Anemia   . Acquired complete AV block   . Sick sinus syndrome ([place])   . Pacemaker-Medtronic 05/20/2012  . Allergic rhinitis 04/25/2012  . Mild intermittent asthma 02/16/2011  . Ejection fraction < 50%   . Atrial fibrillation ([place])   . Mitral regurgitation   . Right ventricular dysfunction   . Mitral stenosis   . S/P mitral valve repair   . Hx of CABG   . T2_NIDDM 03/02/2009  . Hyperlipidemia 07/07/2008  . Essential hypertension 07/07/2008  . Diverticulosis of large intestine 08/04/2002    Current Outpatient Prescriptions on File Prior to Visit  Medication Sig Dispense Refill  . acetaminophen (TYLENOL) 500 MG tablet Take 1,000 mg by mouth every 6 (six) hours as needed. For pain    . atorvastatin (LIPITOR) 40 MG tablet Take 1 tablet by mouth  daily 90 tablet 3  . calcium carbonate (OS-CAL) 600 MG TABS Take 600 mg by mouth daily.     . carvedilol (COREG) 6.25 MG tablet Take 1 tablet by mouth  twice a day with meals 180 tablet 1  . cholecalciferol (VITAMIN D) 1000 UNITS tablet Take 6,000 Units by mouth daily.     . fish oil-omega-3 fatty acids 1000 MG capsule Take 1 g by mouth 2 (two) times daily.     . furosemide (LASIX) 40 MG tablet Take 0.5 tablets (20  mg total) by mouth daily. 90 tablet 1  . loperamide (IMODIUM) 2 MG capsule Take 1 capsule (2 mg total) by mouth daily as needed for diarrhea or loose stools. 30 capsule 10  . loratadine (CLARITIN) 10 MG tablet Take 10 mg by mouth daily.    . metFORMIN (GLUCOPHAGE XR) 500 MG 24 hr tablet Take 1 to 2 tablets 2 x day for Diabetes (Patient taking differently: Take 500 mg by mouth 2 (two) times daily. Take 1 to 2 tablets 2 x day for Diabetes) 360 tablet 99  . potassium chloride SA (KLOR-CON M20) 20 MEQ tablet Take 3 tablets (60 mEq total) by mouth daily. 90 tablet 3  . promethazine-dextromethorphan (PROMETHAZINE-DM) 6.25-15 MG/5ML syrup Take 5-10 mL PO q8hrs prn for cold symptoms 360  mL 1  . trandolapril (MAVIK) 4 MG tablet Take 1 tablet by mouth two  times daily for blood  pressure 180 tablet 3  . venlafaxine XR (EFFEXOR-XR) 37.5 MG 24 hr capsule Take 1 capsule by mouth  every evening 90 capsule 0  . warfarin (COUMADIN) 2.5 MG tablet Take as directed by Anticoagulation Clinic 100 tablet 1   No current facility-administered medications on file prior to visit.    Past Medical History  Diagnosis Date  . Mitral regurgitation     Treated with repair, atrial appendage was removed or tied off at surgery.. this was proven by TEE February, 2011  . Pulmonary HTN ([place])     55 mmHg, echo, February, 2011 / no mention of pulmonary hypertension echo, June, 2011  . Right ventricular dysfunction     Mild to moderate, echo, February, 2011 / normalized echo, June, 2011  . TR (tricuspid regurgitation)     Moderate, echo, February, 2011 / trivial, echo, June, 2011  . Mitral stenosis     Mild, February, 2011, post mitral valve repair / mild, echo, June, 2011  . CAD (coronary artery disease)     LIMA to the LAD at time of mitral valve repair / LIMA atretic,, February, 2011  . Ejection fraction < 50%     EF 30% echo and TEE diagnosis February, 2011, possibly rate related / no contraction by catheter, bradycardia so carvedilol cannot be used / EF 55-60% echo, June, 2011 / EF 50%, echo, February, 2012  . Hemorrhoids, internal   . Diverticulosis of colon   . Colon polyp, hyperplastic   . H/O amiodarone therapy     Satrted 02/12/2012  . High cholesterol   . Asthma   . COPD (chronic obstructive pulmonary disease) ([place])   . HTN (hypertension)     takes Mavik daily  . Myocardial infarction [facility]) 04/2012  ?  . CHF (congestive heart failure) ([place]) 2011    Mild in hospital February, 2011;takes Lasix daily  . Seasonal allergies     takes Claritin daily  . Type II diabetes mellitus ([facility])     takes Metformin daily  . Cataracts, bilateral     immature  . Depression     takes Effexor daily    . Atrial fibrillation ([place])     Treated with maze procedure / recurrent atrial fibrillation February, 2011... TEE cardioversion October 19, 2009; s/p AVN ablatin  . QT prolongation     Tikosyn and Effexor. QT prolonged October 13, 2011, peak is in dose reduced from 500  to -250 twice a day  . Anemia     Hemoglobin 10.4, December, 2013  . GERD (gastroesophageal reflux disease)   . Arthritis     "  not bad; little in my hands; some in my knees" (11/04/2013)  . Cecal cancer ([place]) 2014    Past Surgical History  Procedure Laterality Date  . Mitral valve repair  2001    "anterior and posterior leaflets" (12/04/2012)  . Maze  2001    w/ MVR & CABG  . Cardioversion  02/08/2012    Procedure: CARDIOVERSION;  Surgeon: Carlena Bjornstad, MD;  Location: [facility];  Service: Cardiovascular;  Laterality: N/A;  . Cardioversion  02/29/2012    Procedure: CARDIOVERSION;  Surgeon: Carlena Bjornstad, MD;  Location: [facility];  Service: Cardiovascular;  Laterality: N/A;  . Cardioversion  05/15/2012    Procedure: CARDIOVERSION;  Surgeon: Carlena Bjornstad, MD;  Location: [facility] ENDOSCOPY;  Service: Cardiovascular;  Laterality: N/A;  . Tonsillectomy and adenoidectomy  ~ 1950  . 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 Av node ablation  05/28/12  . Cardiac catheterization  05/2012  . Laparoscopic right hemi colectomy  12/03/2012    Procedure: LAPAROSCOPIC RIGHT HEMI COLECTOMY;  Surgeon: Stark Klein, MD;  Location: [place] [place];  Service: General;;  . Umbilical hernia repair N/A 12/03/2012    Procedure: HERNIA REPAIR UMBILICAL ADULT;  Surgeon: Stark Klein, MD;  Location: [facility];  Service: General;  Laterality: N/A;  . Diagnostic laparoscopic liver biopsy Left 12/03/2012    Procedure: DIAGNOSTIC LAPAROSCOPIC LIVER BIOPSY;  Surgeon: Stark Klein, MD;  Location: [place];  Service: General;  Laterality: Left;  . Coronary artery bypass graft  2001    CABG X1 "at time of mitral valve repair"  (12/04/2012  . Breast lumpectomy Bilateral 1990's  . Cholecystectomy  1995  . Abdominal hysterectomy  ?2002  . Bi-ventricular pacemaker upgrade  11/04/2013    upgrade of previously implanted dual chamber pacemaker to STJ Quadra Allure CRTP by Dr Rayann Heman  . Permanent pacemaker insertion N/A 05/17/2012    Procedure: PERMANENT PACEMAKER INSERTION;  Surgeon: Thompson Grayer, MD;  Location: [facility] CATH LAB;  Service: Cardiovascular;  Laterality: N/A;  . Av node ablation N/A 05/28/2012    Procedure: AV NODE ABLATION;  Surgeon: Thompson Grayer, MD;  Location: [facility] CATH LAB;  Service: Cardiovascular;  Laterality: N/A;  . Bi-ventricular pacemaker upgrade N/A 11/04/2013    Procedure: BI-VENTRICULAR PACEMAKER UPGRADE;  Surgeon: Coralyn Mark, MD;  Location: [facility] [facility] CATH LAB;  Service: Cardiovascular;  Laterality: N/A;    Social History   Social History  . Marital Status: Married    Spouse Name: N/A  . Number of Children: 0  . 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    [employer]     Social History Main Topics  . Smoking status: Never Smoker   . Smokeless tobacco: Never Used  . Alcohol Use: No  . Drug Use: No  . Sexual Activity: No   Other Topics Concern  . None   Social History Narrative   Does not et regular exercise   Daily caffeine use          Family History  Problem Relation Age of Onset  . Pancreatic cancer Maternal Grandfather   . Heart disease Father   . Diabetes Father   . Heart disease Sister     x 2  . Diabetes Mother   . Kidney disease Mother   . Colon cancer Neg Hx   . Colon polyps Neg Hx   .  Diabetes Paternal Grandfather   . Diabetes Paternal Grandmother   . Heart disease Paternal Uncle     x 6  . Prostate cancer Paternal Uncle     Review of Systems  Constitutional: Negative for fever.  Respiratory: Positive for shortness of breath (with exertion). Negative for cough and wheezing.   Cardiovascular: Negative for chest pain, palpitations  and leg swelling.  Gastrointestinal: Negative for nausea and abdominal pain.       No GERD  Neurological: Negative for dizziness, light-headedness and headaches.       Objective:   Filed Vitals:   10/08/15 0843  BP: 154/80  Pulse: 71  Temp: 98.2 F (36.8 C)  Resp: 16   Filed Weights   10/08/15 0843  Weight: 175 lb (79.379 kg)   Body mass index is 31.48 kg/(m^2).   Physical Exam GENERAL APPEARANCE: Appears stated age, well appearing, NAD EYES: conjunctiva clear, no icterus HEENT: bilateral tympanic membranes and ear canals normal, oropharynx with no erythema, no thyromegaly, trachea midline, no cervical or supraclavicular lymphadenopathy LUNGS: Clear to auscultation without wheeze or crackles, unlabored breathing, good air entry bilaterally HEART: Normal S1,S2 without murmurs ABD: soft, nontender EXTREMITIES: Without clubbing, cyanosis, or edema      Assessment & Plan:   See Problem List for Assessment and Plan of chronic medical problems.

## 2015-10-13 ENCOUNTER — Ambulatory Visit (INDEPENDENT_AMBULATORY_CARE_PROVIDER_SITE_OTHER): Payer: Medicare Other | Admitting: *Deleted

## 2015-10-13 DIAGNOSIS — Z950 Presence of cardiac pacemaker: Secondary | ICD-10-CM

## 2015-10-13 DIAGNOSIS — I509 Heart failure, unspecified: Secondary | ICD-10-CM | POA: Diagnosis not present

## 2015-10-13 DIAGNOSIS — I495 Sick sinus syndrome: Secondary | ICD-10-CM

## 2015-10-13 LAB — CUP PACEART REMOTE DEVICE CHECK
Battery Remaining Percentage: 95.5 %
Battery Voltage: 2.99 V
Implantable Lead Implant Date: 20130913
Implantable Lead Implant Date: 20130913
Implantable Lead Implant Date: 20150303
Implantable Lead Location: 753858
Implantable Lead Location: 753859
Implantable Lead Location: 753860
Implantable Lead Model: 5076
Lead Channel Impedance Value: 460 Ohm
Lead Channel Impedance Value: 510 Ohm
Lead Channel Pacing Threshold Amplitude: 1.125 V
Lead Channel Pacing Threshold Amplitude: 1.125 V
Lead Channel Pacing Threshold Pulse Width: 0.6 ms
Lead Channel Pacing Threshold Pulse Width: 0.6 ms
Lead Channel Sensing Intrinsic Amplitude: 10.8 mV
Lead Channel Setting Pacing Amplitude: 2 V
Lead Channel Setting Pacing Pulse Width: 0.4 ms
Lead Channel Setting Pacing Pulse Width: 0.6 ms
Lead Channel Setting Sensing Sensitivity: 3 mV
Lead Channel Setting Sensing Sensitivity: 3 mV
MDC IDC LEAD IMPLANT DT: 20130913
MDC IDC LEAD IMPLANT DT: 20130913
MDC IDC LEAD IMPLANT DT: 20150303
MDC IDC LEAD LOCATION: 753858
MDC IDC LEAD LOCATION: 753859
MDC IDC LEAD LOCATION: 753860
MDC IDC MSMT BATTERY REMAINING LONGEVITY: 96 mo
MDC IDC MSMT BATTERY REMAINING LONGEVITY: 96 mo
MDC IDC MSMT BATTERY REMAINING PERCENTAGE: 95.5 %
MDC IDC MSMT BATTERY VOLTAGE: 2.99 V
MDC IDC MSMT LEADCHNL LV IMPEDANCE VALUE: 460 Ohm
MDC IDC MSMT LEADCHNL RV IMPEDANCE VALUE: 510 Ohm
MDC IDC MSMT LEADCHNL RV PACING THRESHOLD AMPLITUDE: 0.75 V
MDC IDC MSMT LEADCHNL RV PACING THRESHOLD AMPLITUDE: 0.75 V
MDC IDC MSMT LEADCHNL RV PACING THRESHOLD PULSEWIDTH: 0.4 ms
MDC IDC MSMT LEADCHNL RV PACING THRESHOLD PULSEWIDTH: 0.4 ms
MDC IDC MSMT LEADCHNL RV SENSING INTR AMPL: 10.8 mV
MDC IDC PG MODEL: 3242
MDC IDC PG SERIAL: 7548835
MDC IDC SESS DTM: 20170208070014
MDC IDC SESS DTM: 20170208070014
MDC IDC SET LEADCHNL LV PACING AMPLITUDE: 2.125
MDC IDC SET LEADCHNL LV PACING AMPLITUDE: 2.125
MDC IDC SET LEADCHNL LV PACING PULSEWIDTH: 0.6 ms
MDC IDC SET LEADCHNL RV PACING AMPLITUDE: 2 V
MDC IDC SET LEADCHNL RV PACING PULSEWIDTH: 0.4 ms
Pulse Gen Model: 3242
Pulse Gen Serial Number: 7548835

## 2015-10-13 NOTE — Progress Notes (Signed)
Remote pacemaker transmission.   

## 2015-10-14 ENCOUNTER — Telehealth: Payer: Self-pay

## 2015-10-14 NOTE — Telephone Encounter (Signed)
Remote ICM transmission received.  Attempted patient call and left message for return call.   

## 2015-10-15 ENCOUNTER — Ambulatory Visit: Payer: Medicare Other

## 2015-10-15 ENCOUNTER — Ambulatory Visit (INDEPENDENT_AMBULATORY_CARE_PROVIDER_SITE_OTHER): Payer: Medicare Other | Admitting: General Practice

## 2015-10-15 ENCOUNTER — Other Ambulatory Visit: Payer: Self-pay | Admitting: General Practice

## 2015-10-15 DIAGNOSIS — I4891 Unspecified atrial fibrillation: Secondary | ICD-10-CM | POA: Diagnosis not present

## 2015-10-15 DIAGNOSIS — Z5181 Encounter for therapeutic drug level monitoring: Secondary | ICD-10-CM | POA: Diagnosis not present

## 2015-10-15 LAB — POCT INR: INR: 2.6

## 2015-10-15 MED ORDER — SITAGLIPTIN PHOSPHATE 100 MG PO TABS: 100.0000 mg | ORAL_TABLET | Freq: Every day | ORAL | Status: DC

## 2015-10-15 NOTE — Progress Notes (Signed)
I have reviewed and agree with the plan. 

## 2015-10-15 NOTE — Progress Notes (Signed)
Pre visit review using our clinic review tool, if applicable. No additional management support is needed unless otherwise documented below in the visit note. 

## 2015-10-18 ENCOUNTER — Other Ambulatory Visit: Payer: Self-pay

## 2015-10-18 ENCOUNTER — Telehealth: Payer: Self-pay | Admitting: Internal Medicine

## 2015-10-18 DIAGNOSIS — Z1231 Encounter for screening mammogram for malignant neoplasm of breast: Secondary | ICD-10-CM

## 2015-10-18 DIAGNOSIS — Z13820 Encounter for screening for osteoporosis: Secondary | ICD-10-CM

## 2015-10-18 NOTE — Telephone Encounter (Signed)
Patient sent in an appt request for a bone density through mychart. Can you put an order in for her.  I will let her know Dr. Quay Burow is out for the week.

## 2015-10-20 NOTE — Telephone Encounter (Signed)
Unable to reach patient for remote monthly ICM follow up.  Next remote ICM transmission scheduled for 11/29/2015 and patient letter sent with new date and to call if experiencing any symptoms.  Patient has appointment with Dr Irish Lack on 11/30/2015.  [place] thoracic impedance below reference line from 10/01/2015 to 10/09/2015 suggesting fluid retention.  Thoracic impedance back to reference line day 10/19/2015 per direct trend viewer.  ICM Trend for 10/13/2015

## 2015-10-21 ENCOUNTER — Other Ambulatory Visit: Payer: Self-pay | Admitting: Internal Medicine

## 2015-10-23 NOTE — Telephone Encounter (Signed)
Please advise 

## 2015-10-24 NOTE — Telephone Encounter (Signed)
ordered

## 2015-10-25 NOTE — Telephone Encounter (Signed)
LVM informing pt

## 2015-11-11 ENCOUNTER — Other Ambulatory Visit: Payer: Self-pay | Admitting: Cardiology

## 2015-11-15 DIAGNOSIS — C182 Malignant neoplasm of ascending colon: Secondary | ICD-10-CM | POA: Diagnosis not present

## 2015-11-19 ENCOUNTER — Ambulatory Visit: Payer: Medicare Other

## 2015-11-19 ENCOUNTER — Ambulatory Visit (INDEPENDENT_AMBULATORY_CARE_PROVIDER_SITE_OTHER): Payer: Medicare Other | Admitting: General Practice

## 2015-11-19 DIAGNOSIS — Z5181 Encounter for therapeutic drug level monitoring: Secondary | ICD-10-CM

## 2015-11-19 DIAGNOSIS — I4891 Unspecified atrial fibrillation: Secondary | ICD-10-CM | POA: Diagnosis not present

## 2015-11-19 LAB — POCT INR: INR: 2.1

## 2015-11-19 NOTE — Progress Notes (Signed)
Pre visit review using our clinic review tool, if applicable. No additional management support is needed unless otherwise documented below in the visit note. 

## 2015-11-19 NOTE — Progress Notes (Signed)
I have reviewed and agree with the plan. 

## 2015-11-22 ENCOUNTER — Telehealth: Payer: Self-pay | Admitting: Internal Medicine

## 2015-11-22 NOTE — Telephone Encounter (Signed)
Patient called desiring to stop effexor.  She was noted to have established with a new PCP.  Patient advised to call new PCP Dr. Billey Gosling.

## 2015-11-25 ENCOUNTER — Telehealth: Payer: Self-pay | Admitting: *Deleted

## 2015-11-25 DIAGNOSIS — E119 Type 2 diabetes mellitus without complications: Secondary | ICD-10-CM

## 2015-11-25 NOTE — Telephone Encounter (Signed)
Pt left msg on triage stating would like a referral to see a Podiatrist since she is a diabetic needing to get her feet check & toenail clip. Also she is wanting to stop taking the Effexor wanting to know if she need to taper down or can she just stop taking med...Johny Chess

## 2015-11-26 NOTE — Telephone Encounter (Signed)
Referral ordered.  She can take the effexor every other day for 1 week and then stop.  If she has any problems she should call

## 2015-11-26 NOTE — Telephone Encounter (Signed)
Called pt no answer LMOM w/MD response../lmb 

## 2015-11-29 ENCOUNTER — Telehealth: Payer: Self-pay

## 2015-11-29 ENCOUNTER — Ambulatory Visit (INDEPENDENT_AMBULATORY_CARE_PROVIDER_SITE_OTHER): Payer: Medicare Other

## 2015-11-29 DIAGNOSIS — Z950 Presence of cardiac pacemaker: Secondary | ICD-10-CM

## 2015-11-29 DIAGNOSIS — I509 Heart failure, unspecified: Secondary | ICD-10-CM | POA: Diagnosis not present

## 2015-11-29 NOTE — Progress Notes (Signed)
EPIC Encounter for ICM Monitoring  Patient Name: Anita Duke is a 75 y.o. female Date: 11/29/2015 Primary Care Physican: Binnie Rail, MD Primary Cardiologist: Irish Lack Electrophysiologist: Allred Dry Weight: 175 lbs in the office   Bi-V Pacing 98%      In the past month, have you:  1. Gained more than 2 pounds in a day or more than 5 pounds in a week? no  2. Had changes in your medications (with verification of current medications)? no  3. Had more shortness of breath than is usual for you? no  4. Limited your activity because of shortness of breath? no  5. Not been able to sleep because of shortness of breath? no  6. Had increased swelling in your feet or ankles? no  7. Had symptoms of dehydration (dizziness, dry mouth, increased thirst, decreased urine output) no  8. Had changes in sodium restriction? no  9. Been compliant with medication? Yes   ICM trend: 3 month view for 11/29/2015   ICM trend: 1 year view for 11/29/2015   Follow-up plan: ICM clinic phone appointment on 12/03/2015.  Reviewed transmission with patient and Dr Irish Lack during office visit with Dr Irish Lack on 11/30/2015.  Thoracic impedance below reference line from 11/05/2015 to 11/14/2015, 11/28/2015 to 11/29/2015 suggesting fluid accumulation. Thoracic impedance above reference line from 11/16/2015 to 11/22/2015 suggesting dryness during that time.   Patient denied any fluid symptoms.    Education given to limit sodium intake to < 2000 mg and fluid intake to 64 oz daily.  Encouraged to call for any fluid symptoms.  Patient stated they are changing phone systems at home.  She also stated she will be traveling to the beach for indefinite period of time and advised to take monitor with her.   No changes today.    Will provide copy for Dr Irish Lack to review at office appointment on 11/30/2015.   Patient can take extra fluid medication as needed per Dr Hassell Done office note from today.    [name], RN,  CCM 11/29/2015 3:06 PM

## 2015-11-29 NOTE — Telephone Encounter (Signed)
Remote ICM transmission received.  Attempted patient call and she stated she was busy and asked if I could see her at the appointment with Dr Irish Lack tomorrow, 11/30/2015.

## 2015-11-30 ENCOUNTER — Encounter: Payer: Self-pay | Admitting: Interventional Cardiology

## 2015-11-30 ENCOUNTER — Ambulatory Visit (INDEPENDENT_AMBULATORY_CARE_PROVIDER_SITE_OTHER): Payer: Medicare Other | Admitting: Interventional Cardiology

## 2015-11-30 VITALS — BP 138/82 | HR 60 | Ht 62.5 in | Wt 175.8 lb

## 2015-11-30 DIAGNOSIS — I2510 Atherosclerotic heart disease of native coronary artery without angina pectoris: Secondary | ICD-10-CM | POA: Diagnosis not present

## 2015-11-30 DIAGNOSIS — I5189 Other ill-defined heart diseases: Secondary | ICD-10-CM | POA: Diagnosis not present

## 2015-11-30 DIAGNOSIS — I4891 Unspecified atrial fibrillation: Secondary | ICD-10-CM

## 2015-11-30 DIAGNOSIS — Z7901 Long term (current) use of anticoagulants: Secondary | ICD-10-CM

## 2015-11-30 DIAGNOSIS — I519 Heart disease, unspecified: Secondary | ICD-10-CM

## 2015-11-30 DIAGNOSIS — E782 Mixed hyperlipidemia: Secondary | ICD-10-CM

## 2015-11-30 DIAGNOSIS — Z9889 Other specified postprocedural states: Secondary | ICD-10-CM

## 2015-11-30 DIAGNOSIS — Z950 Presence of cardiac pacemaker: Secondary | ICD-10-CM

## 2015-11-30 LAB — COMPREHENSIVE METABOLIC PANEL
ALT: 15 U/L (ref 6–29)
AST: 18 U/L (ref 10–35)
Albumin: 4 g/dL (ref 3.6–5.1)
Alkaline Phosphatase: 69 U/L (ref 33–130)
BILIRUBIN TOTAL: 1.3 mg/dL — AB (ref 0.2–1.2)
BUN: 14 mg/dL (ref 7–25)
CO2: 27 mmol/L (ref 20–31)
CREATININE: 0.66 mg/dL (ref 0.60–0.93)
Calcium: 9.1 mg/dL (ref 8.6–10.4)
Chloride: 105 mmol/L (ref 98–110)
GLUCOSE: 151 mg/dL — AB (ref 65–99)
Potassium: 3.8 mmol/L (ref 3.5–5.3)
SODIUM: 140 mmol/L (ref 135–146)
Total Protein: 6.3 g/dL (ref 6.1–8.1)

## 2015-11-30 NOTE — Progress Notes (Signed)
Patient ID: Anita Duke, female   DOB: 04/17/1941, 75 y.o.   MRN: [id number]     Cardiology Office Note   Date:  11/30/2015   ID:  Anita, Duke Jan 26, 1941, MRN [id number]  PCP:  Binnie Rail, MD    No chief complaint on file. f/u pacer, nonischemic cardiomyopathy, mitral valve disease   Wt Readings from Last 3 Encounters:  11/30/15 175 lb 12.8 oz (79.742 kg)  10/08/15 175 lb (79.379 kg)  09/29/15 174 lb (78.926 kg)       History of Present Illness: ALESE Duke is a 75 y.o. female  Who has had a mitral valve repair, Maze procedure at the [facility] clinic in 2001.  She had a LIMA graft also placed at that time but the LIMA to LAD was noted to be atretic, most recently in 2014 at the time of cath.  50-60% mid LAD lesion noted at that time.  She has had variable LVEF as well.  EF was 40% in April 2016.  She has had persistent AFib and is maintained on Coumadin.  She is checked every six weeks.    She walks her dog several times a day.    No recent swelling or SHOB.  Pacer detected some excess fluid recently.  No chest pain or SHOB.    Past Medical History  Diagnosis Date  . Mitral regurgitation     Treated with repair, atrial appendage was removed or tied off at surgery.. this was proven by TEE February, 2011  . Pulmonary HTN ([place])     55 mmHg, echo, February, 2011 / no mention of pulmonary hypertension echo, June, 2011  . Right ventricular dysfunction     Mild to moderate, echo, February, 2011 / normalized echo, June, 2011  . TR (tricuspid regurgitation)     Moderate, echo, February, 2011 / trivial, echo, June, 2011  . Mitral stenosis     Mild, February, 2011, post mitral valve repair / mild, echo, June, 2011  . CAD (coronary artery disease)     LIMA to the LAD at time of mitral valve repair / LIMA atretic,, February, 2011  . Ejection fraction < 50%     EF 30% echo and TEE diagnosis February, 2011, possibly rate related / no contraction by catheter, bradycardia so  carvedilol cannot be used / EF 55-60% echo, June, 2011 / EF 50%, echo, February, 2012  . Hemorrhoids, internal   . Diverticulosis of colon   . Colon polyp, hyperplastic   . H/O amiodarone therapy     Satrted 02/12/2012  . High cholesterol   . Asthma   . COPD (chronic obstructive pulmonary disease) ([place])   . HTN (hypertension)     takes Mavik daily  . Myocardial infarction [facility]) 04/2012  ?  . CHF (congestive heart failure) ([place]) 2011    Mild in hospital February, 2011;takes Lasix daily  . Seasonal allergies     takes Claritin daily  . Type II diabetes mellitus (HCC)     takes Metformin daily  . Cataracts, bilateral     immature  . Depression     takes Effexor daily  . Atrial fibrillation ([place])     Treated with maze procedure / recurrent atrial fibrillation February, 2011... TEE cardioversion October 19, 2009; s/p AVN ablatin  . QT prolongation     Tikosyn and Effexor. QT prolonged October 13, 2011, peak is in dose reduced from 500  to -250 twice a day  . Anemia  Hemoglobin 10.4, December, 2013  . GERD (gastroesophageal reflux disease)   . Arthritis     "not bad; little in my hands; some in my knees" (11/04/2013)  . 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 Mitral valve repair  2001    "anterior and posterior leaflets" (12/04/2012)  . Maze  2001    w/ MVR & CABG  . Cardioversion  02/08/2012    Procedure: CARDIOVERSION;  Surgeon: Carlena Bjornstad, MD;  Location: [place];  Service: Cardiovascular;  Laterality: N/A;  . Cardioversion  02/29/2012    Procedure: CARDIOVERSION;  Surgeon: Carlena Bjornstad, MD;  Location: [place];  Service: Cardiovascular;  Laterality: N/A;  . Cardioversion  05/15/2012    Procedure: CARDIOVERSION;  Surgeon: Carlena Bjornstad, MD;  Location: [facility] ENDOSCOPY;  Service: Cardiovascular;  Laterality: N/A;  . Tonsillectomy and adenoidectomy  ~ 1950  . Pacemaker insertion  05/17/2012    MDT Adapta L implanted by Dr Rayann Heman for tachy/brady  syndrome  . Av node ablation  05/28/12  . Cardiac catheterization  05/2012  . Laparoscopic right hemi colectomy  12/03/2012    Procedure: LAPAROSCOPIC RIGHT HEMI COLECTOMY;  Surgeon: Stark Klein, MD;  Location: [place];  Service: General;;  . Umbilical hernia repair N/A 12/03/2012    Procedure: HERNIA REPAIR UMBILICAL ADULT;  Surgeon: Stark Klein, MD;  Location: [place];  Service: General;  Laterality: N/A;  . Diagnostic laparoscopic liver biopsy Left 12/03/2012    Procedure: DIAGNOSTIC LAPAROSCOPIC LIVER BIOPSY;  Surgeon: Stark Klein, MD;  Location: [place];  Service: General;  Laterality: Left;  . Coronary artery bypass graft  2001    CABG X1 "at time of mitral valve repair" (12/04/2012  . Breast lumpectomy Bilateral 1990's  . Cholecystectomy  1995  . Abdominal hysterectomy  ?2002  . Bi-ventricular pacemaker upgrade  11/04/2013    upgrade of previously implanted dual chamber pacemaker to STJ Quadra Allure CRTP by Dr Rayann Heman  . Permanent pacemaker insertion N/A 05/17/2012    Procedure: PERMANENT PACEMAKER INSERTION;  Surgeon: Thompson Grayer, MD;  Location: [facility] CATH LAB;  Service: Cardiovascular;  Laterality: N/A;  . Av node ablation N/A 05/28/2012    Procedure: AV NODE ABLATION;  Surgeon: Thompson Grayer, MD;  Location: [facility] CATH LAB;  Service: Cardiovascular;  Laterality: N/A;  . Bi-ventricular pacemaker upgrade N/A 11/04/2013    Procedure: BI-VENTRICULAR PACEMAKER UPGRADE;  Surgeon: Coralyn Mark, MD;  Location: [place] CATH LAB;  Service: Cardiovascular;  Laterality: N/A;     Current Outpatient Prescriptions  Medication Sig Dispense Refill  . atorvastatin (LIPITOR) 40 MG tablet Take 1 tablet by mouth  daily 90 tablet 3  . blood glucose meter kit and supplies KIT Dispense based on patient and insurance preference. Check sugars daily. NIDDM 1 each 0  . calcium carbonate (OS-CAL) 600 MG TABS Take 600 mg by mouth daily.     . carvedilol (COREG) 6.25 MG tablet Take 1 tablet by mouth  twice a day with meals 180 tablet 0   . cholecalciferol (VITAMIN D) 1000 UNITS tablet Take 6,000 Units by mouth daily.     . diphenhydramine-acetaminophen (TYLENOL PM) 25-500 MG TABS tablet Take 1 tablet by mouth at bedtime as needed. 14 tablet   . fish oil-omega-3 fatty acids 1000 MG capsule Take 1 g by mouth 2 (two) times daily.     . furosemide (LASIX) 40 MG tablet Take 0.5 tablets (20 mg total) by mouth daily. [place]  tablet 1  . loperamide (IMODIUM) 2 MG capsule Take 1 capsule (2 mg total) by mouth daily as needed for diarrhea or loose stools. 30 capsule 10  . loratadine (CLARITIN) 10 MG tablet Take 10 mg by mouth daily.    Marland Kitchen METFORMIN HCL PO Take 1 tablet by mouth 2 (two) times daily. Patient unsure of MG    . potassium chloride SA (K-DUR,KLOR-CON) 20 MEQ tablet Take 3 tablets by mouth  daily 90 tablet 1  . trandolapril (MAVIK) 4 MG tablet Take 1 tablet by mouth two  times daily for blood  pressure 180 tablet 3  . venlafaxine XR (EFFEXOR-XR) 37.5 MG 24 hr capsule Take 1 capsule by mouth  every evening 90 capsule 0  . warfarin (COUMADIN) 2.5 MG tablet Take as directed by Anticoagulation Clinic 100 tablet 1   No current facility-administered medications for this visit.    Allergies:   Demerol; Morphine; Metformin and related; and Tetanus toxoid    Social History:  The patient  reports that she has never smoked. She has never used smokeless tobacco. She reports that she does not drink alcohol or use illicit drugs.   Family History:  The patient's family history includes Diabetes in her father, mother, paternal grandfather, and paternal grandmother; Heart disease in her father, paternal uncle, and sister; Kidney disease in her mother; Pancreatic cancer in her maternal grandfather; Prostate cancer in her paternal uncle. There is no history of Colon cancer or Colon polyps.    ROS:  Please see the history of present illness.   Otherwise, review of systems are positive for forgetting Lasix some days.   All other systems are reviewed  and negative.    PHYSICAL EXAM: VS:  BP 138/82 mmHg  Pulse 60  Ht 5' 2.5" (1.588 m)  Wt 175 lb 12.8 oz (79.742 kg)  BMI 31.62 kg/m2 , BMI Body mass index is 31.62 kg/(m^2). GEN: Well nourished, well developed, in no acute distress HEENT: normal Neck: no JVD, carotid bruits, or masses Cardiac: RRR; no murmurs, rubs, or gallops,no edema  Respiratory:  clear to auscultation bilaterally, normal work of breathing GI: soft, nontender, nondistended, + BS MS: no deformity or atrophy Skin: warm and dry, no rash Neuro:  Strength and sensation are intact Psych: euthymic mood, full affect    Recent Labs: 06/11/2015: ALT 21; BUN 12; Creat 0.49*; Hemoglobin 14.1; Magnesium 1.7; Platelets 208; Potassium 4.1; Sodium 141; TSH 0.581   Lipid Panel    Component Value Date/Time   CHOL 145 06/11/2015 1101   TRIG 244* 06/11/2015 1101   HDL 44* 06/11/2015 1101   CHOLHDL 3.3 06/11/2015 1101   VLDL 49* 06/11/2015 1101   LDLCALC 52 06/11/2015 1101     Other studies Reviewed: Additional studies/ records that were reviewed today with results demonstrating: 2016 echo, 2014 cath reviewed.  Prior transient RV dsfunction.   ASSESSMENT AND PLAN:  1. Nonischemic cardiac myopathy/chronic systolic dysfunction: Last ejection fraction noted was 40%. She is not having any symptoms of congestive heart failure. Her pacemaker is showing some evidence of fluid overload. She denies symptoms. She also notes that on some days, she skips her Lasix because she will not be at home and cannot really use the bathroom. I have asked her to weigh herself daily. This will also help guide her as to when it is imperative that she takes her Lasix. She should also look for shortness of breath and swelling. 2.  Mitral valve repair: She needs to continue with  antibiotic prophylaxis for dental procedures. 3. Atrial fibrillation: Status post Maze procedure. Coumadin for stroke prevention. This has been well maintained. 4. Pacemaker:  She follows up with Dr. Rayann Heman. 5. Hypertriglyceridemia/hyperlipidemia: Diet control.  Continue atorvastatin. Continue aggressive blood sugar control.   Current medicines are reviewed at length with the patient today.  The patient concerns regarding her medicines were addressed.  The following changes have been made:  I have given her the flexibility of adjusting her Lasix dose based on her swelling and weights at home.  Labs/ tests ordered today include:  No orders of the defined types were placed in this encounter.    Recommend 150 minutes/week of aerobic exercise Low fat, low carb, high fiber diet recommended  Disposition:   FU in 9 months   Teresita Madura., MD  11/30/2015 9:57 AM    [facility] [address], [place], [place]  [address] Phone: [phone]; Fax: [phone]

## 2015-11-30 NOTE — Patient Instructions (Signed)
Medication Instructions:  Same-no changes  Labwork: CMET today  Testing/Procedures: None  Follow-Up: Your physician wants you to follow-up in: 9 months. You will receive a reminder letter in the mail two months in advance. If you don't receive a letter, please call our office to schedule the follow-up appointment.     If you need a refill on your cardiac medications before your next appointment, please call your pharmacy.

## 2015-12-02 DIAGNOSIS — H2512 Age-related nuclear cataract, left eye: Secondary | ICD-10-CM | POA: Diagnosis not present

## 2015-12-02 DIAGNOSIS — H25812 Combined forms of age-related cataract, left eye: Secondary | ICD-10-CM | POA: Diagnosis not present

## 2015-12-03 ENCOUNTER — Ambulatory Visit (INDEPENDENT_AMBULATORY_CARE_PROVIDER_SITE_OTHER): Payer: Medicare Other

## 2015-12-03 ENCOUNTER — Telehealth: Payer: Self-pay | Admitting: Cardiology

## 2015-12-03 DIAGNOSIS — I509 Heart failure, unspecified: Secondary | ICD-10-CM

## 2015-12-03 DIAGNOSIS — Z950 Presence of cardiac pacemaker: Secondary | ICD-10-CM

## 2015-12-03 NOTE — Telephone Encounter (Signed)
Spoke with pt and reminded pt of remote transmission that is due today. Pt verbalized understanding.   

## 2015-12-04 ENCOUNTER — Other Ambulatory Visit: Payer: Self-pay | Admitting: Cardiology

## 2015-12-04 ENCOUNTER — Other Ambulatory Visit: Payer: Self-pay | Admitting: Internal Medicine

## 2015-12-06 ENCOUNTER — Telehealth: Payer: Self-pay

## 2015-12-06 ENCOUNTER — Other Ambulatory Visit: Payer: Self-pay | Admitting: General Practice

## 2015-12-06 MED ORDER — WARFARIN SODIUM 2.5 MG PO TABS: ORAL_TABLET | ORAL | Status: DC

## 2015-12-06 NOTE — Telephone Encounter (Signed)
Spoke with patient and reminded to send ICM remote transmission today.  She stated she will do so.

## 2015-12-06 NOTE — Progress Notes (Signed)
EPIC Encounter for ICM Monitoring  Patient Name: Anita Duke is a 75 y.o. female Date: 12/06/2015 Primary Care Physican: Binnie Rail, MD Primary Cardiologist: Irish Lack Electrophysiologist: Allred Dry Weight: unknown   Bi-V Pacing 98%      In the past month, have you:  1. Gained more than 2 pounds in a day or more than 5 pounds in a week? no  2. Had changes in your medications (with verification of current medications)? no  3. Had more shortness of breath than is usual for you? no  4. Limited your activity because of shortness of breath? no  5. Not been able to sleep because of shortness of breath? no  6. Had increased swelling in your feet or ankles? no  7. Had symptoms of dehydration (dizziness, dry mouth, increased thirst, decreased urine output) no  8. Had changes in sodium restriction? no  9. Been compliant with medication? No, she stated she does not 3 tablets of Potassium, she only takes 1 tablet   ICM trend: 3 month view for 12/06/2015   ICM trend: 1 year view for 12/06/2015   Follow-up plan: ICM clinic phone appointment on 12/15/2015.  Thoracic impedance below reference line since 11/27/2015 suggesting fluid accumulation.  Patient denied fluid symptoms.   She stated they are going to the beach for about 3 weeks.  Encouraged to call for any fluid symptoms.    Recommended to take Furosemide 40 mg one tablet x 3 days and increase Potassium by 1 tablet 20 meq x 3 days.  Advised to go back to normal prescribed dosage after 3 days.  Repeat transmission on 12/15/2015.    Advised will send to Dr Irish Lack and Dr Rayann Heman for review and any further recommendations.   Copy of note sent to patient's primary care physician, primary cardiologist, and device following physician.  [name], RN, CCM 12/06/2015 1:01 PM

## 2015-12-15 ENCOUNTER — Ambulatory Visit (INDEPENDENT_AMBULATORY_CARE_PROVIDER_SITE_OTHER): Payer: Medicare Other

## 2015-12-15 DIAGNOSIS — I509 Heart failure, unspecified: Secondary | ICD-10-CM

## 2015-12-15 DIAGNOSIS — Z950 Presence of cardiac pacemaker: Secondary | ICD-10-CM

## 2015-12-15 NOTE — Progress Notes (Signed)
EPIC Encounter for ICM Monitoring  Patient Name: Anita Duke is a 75 y.o. female Date: 12/15/2015 Primary Care Physican: Binnie Rail, MD Primary Cardiologist: Irish Lack Electrophysiologist: Allred Dry Weight: unknown   Bi-V Pacing 98%      In the past month, have you:  1. Gained more than 2 pounds in a day or more than 5 pounds in a week? unknown  2. Had changes in your medications (with verification of current medications)? no  3. Had more shortness of breath than is usual for you? no  4. Limited your activity because of shortness of breath? no  5. Not been able to sleep because of shortness of breath? no  6. Had increased swelling in your feet or ankles? no  7. Had symptoms of dehydration (dizziness, dry mouth, increased thirst, decreased urine output) no  8. Had changes in sodium restriction? no  9. Been compliant with medication? Yes   ICM trend: 3 month view for 12/15/2015   ICM trend: 1 year view for 12/15/2015   Follow-up plan: ICM clinic phone appointment on 01/12/2016.  Thoracic impedance above reference line from 12/08/2015 to 12/13/2015 suggesting dryness which correlates with Furosemide increase dosage x days on 12/06/2015 to 12/09/2015 .  Patient denied fluid symptoms.  Encouraged to call for any fluid symptoms.  No changes today.     Copy of note sent to patient's primary care physician, primary cardiologist, and device following physician.  [name], RN, CCM 12/15/2015 10:15 AM

## 2015-12-23 ENCOUNTER — Other Ambulatory Visit: Payer: Self-pay | Admitting: Internal Medicine

## 2015-12-27 ENCOUNTER — Ambulatory Visit
Admission: RE | Admit: 2015-12-27 | Discharge: 2015-12-27 | Disposition: A | Discharge status: Home / Self Care | Payer: Medicare Other | Source: Ambulatory Visit

## 2015-12-27 DIAGNOSIS — Z1231 Encounter for screening mammogram for malignant neoplasm of breast: Secondary | ICD-10-CM

## 2015-12-29 ENCOUNTER — Ambulatory Visit (INDEPENDENT_AMBULATORY_CARE_PROVIDER_SITE_OTHER): Payer: Medicare Other | Admitting: Podiatry

## 2015-12-29 ENCOUNTER — Encounter: Payer: Self-pay | Admitting: Podiatry

## 2015-12-29 VITALS — BP 140/70 | HR 70 | Resp 12

## 2015-12-29 DIAGNOSIS — E119 Type 2 diabetes mellitus without complications: Secondary | ICD-10-CM

## 2015-12-29 DIAGNOSIS — B351 Tinea unguium: Secondary | ICD-10-CM

## 2015-12-29 NOTE — Patient Instructions (Signed)
Removed Band-Aid on the left great toe in 1-3 days and apply topical antibiotic ointment and a Band-Aid until a scab forms. Return every 3-4 months for debridement of mycotic toenails  Diabetes and Foot Care Diabetes may cause you to have problems because of poor blood supply (circulation) to your feet and legs. This may cause the skin on your feet to become thinner, break easier, and heal more slowly. Your skin may become dry, and the skin may peel and crack. You may also have nerve damage in your legs and feet causing decreased feeling in them. You may not notice minor injuries to your feet that could lead to infections or more serious problems. Taking care of your feet is one of the most important things you can do for yourself.  HOME CARE INSTRUCTIONS  Wear shoes at all times, even in the house. Do not go barefoot. Bare feet are easily injured.  Check your feet daily for blisters, cuts, and redness. If you cannot see the bottom of your feet, use a mirror or ask someone for help.  Wash your feet with warm water (do not use hot water) and mild soap. Then pat your feet and the areas between your toes until they are completely dry. Do not soak your feet as this can dry your skin.  Apply a moisturizing lotion or petroleum jelly (that does not contain alcohol and is unscented) to the skin on your feet and to dry, brittle toenails. Do not apply lotion between your toes.  Trim your toenails straight across. Do not dig under them or around the cuticle. File the edges of your nails with an emery board or nail file.  Do not cut corns or calluses or try to remove them with medicine.  Wear clean socks or stockings every day. Make sure they are not too tight. Do not wear knee-high stockings since they may decrease blood flow to your legs.  Wear shoes that fit properly and have enough cushioning. To break in new shoes, wear them for just a few hours a day. This prevents you from injuring your feet. Always  look in your shoes before you put them on to be sure there are no objects inside.  Do not cross your legs. This may decrease the blood flow to your feet.  If you find a minor scrape, cut, or break in the skin on your feet, keep it and the skin around it clean and dry. These areas may be cleansed with mild soap and water. Do not cleanse the area with peroxide, alcohol, or iodine.  When you remove an adhesive bandage, be sure not to damage the skin around it.  If you have a wound, look at it several times a day to make sure it is healing.  Do not use heating pads or hot water bottles. They may burn your skin. If you have lost feeling in your feet or legs, you may not know it is happening until it is too late.  Make sure your health care provider performs a complete foot exam at least annually or more often if you have foot problems. Report any cuts, sores, or bruises to your health care provider immediately. SEEK MEDICAL CARE IF:   You have an injury that is not healing.  You have cuts or breaks in the skin.  You have an ingrown nail.  You notice redness on your legs or feet.  You feel burning or tingling in your legs or feet.  You have  pain or cramps in your legs and feet.  Your legs or feet are numb.  Your feet always feel cold. SEEK IMMEDIATE MEDICAL CARE IF:   There is increasing redness, swelling, or pain in or around a wound.  There is a red line that goes up your leg.  Pus is coming from a wound.  You develop a fever or as directed by your health care provider.  You notice a bad smell coming from an ulcer or wound.   This information is not intended to replace advice given to you by your health care provider. Make sure you discuss any questions you have with your health care provider.   Document Released: 08/18/2000 Document Revised: 04/23/2013 Document Reviewed: 01/28/2013 Elsevier Interactive Patient Education [employer].

## 2015-12-29 NOTE — Progress Notes (Signed)
   Subjective:    Patient ID: Anita Duke, female    DOB: Nov 01, 1940, 75 y.o.   MRN: [id number]  HPI    This patient presents today concerned about a 4 year history of gradually thickening and color changes toenails especially the hallux toenails . Patient has apply topical antifungal medication to the area for approximately year without any noticeable changes in the appearance of the toenails. Patient has difficulty trimming the toenails because of the thickness within the toenails. Patient is a type II diabetic and denies any history of skin ulceration, claudication or amputation The patient's husband is present in the treatment room today     Review of Systems  Skin: Positive for color change.       Objective:   Physical Exam  Orientated 3  Vascular: DP and PT pulses 2/4 bilaterally Capillary reflex immediate bilaterally  Neurological: Sensation to 10 g monofilament wire intact 5/5 bilaterally Vibratory sensation reactive bilaterally Ankle reflex equal and reactive bilaterally  Dermatological: No open skin lesions bilaterally Varicosities ankle and dorsal feet bilaterally The toenails are elongated, incurvated, deformed with texture and color changes with maximum deformity and hallux toenails bilaterally  Musculoskeletal: Bunionette's bilaterally HAV deformity left There is no restriction or crepitus on range of motion of ankle, subtalar, midtarsal joints bilaterally       Assessment & Plan:   Assessment: Satisfactory neurovascular status Diabetic without foot complications Mycotic toenails  Plan: Today I reviewed the results of examination with patient today and made aware that she has satisfactory neurovascular status. I discussed treatment options for fungal toenails i including topical medication, oral medication, repetitive debridement and patient opts for debridement.  The toenails were debrided mechanically electronically 6-10 with slight bleeding  distal left hallux which was treated with antibiotic ointment and Band-Aid. Patient advised removed Band-Aid 1-3 days and continue to apply topical antibiotic ointment and Band-Aid until a scab forms  Reappoint 3 months

## 2015-12-31 ENCOUNTER — Ambulatory Visit (INDEPENDENT_AMBULATORY_CARE_PROVIDER_SITE_OTHER): Payer: Medicare Other | Admitting: General Practice

## 2015-12-31 DIAGNOSIS — Z5181 Encounter for therapeutic drug level monitoring: Secondary | ICD-10-CM | POA: Diagnosis not present

## 2015-12-31 DIAGNOSIS — I4891 Unspecified atrial fibrillation: Secondary | ICD-10-CM

## 2015-12-31 LAB — POCT INR: INR: 2

## 2015-12-31 NOTE — Progress Notes (Signed)
Pre visit review using our clinic review tool, if applicable. No additional management support is needed unless otherwise documented below in the visit note. 

## 2015-12-31 NOTE — Progress Notes (Signed)
I have reviewed and agree with the plan. 

## 2016-01-05 ENCOUNTER — Other Ambulatory Visit (INDEPENDENT_AMBULATORY_CARE_PROVIDER_SITE_OTHER): Payer: Medicare Other

## 2016-01-05 ENCOUNTER — Encounter: Payer: Self-pay | Admitting: Internal Medicine

## 2016-01-05 ENCOUNTER — Ambulatory Visit (INDEPENDENT_AMBULATORY_CARE_PROVIDER_SITE_OTHER): Payer: Medicare Other | Admitting: Internal Medicine

## 2016-01-05 ENCOUNTER — Ambulatory Visit (INDEPENDENT_AMBULATORY_CARE_PROVIDER_SITE_OTHER)
Admission: RE | Admit: 2016-01-05 | Discharge: 2016-01-05 | Disposition: A | Discharge status: Home / Self Care | Payer: Medicare Other | Source: Ambulatory Visit | Attending: Internal Medicine | Admitting: Internal Medicine

## 2016-01-05 VITALS — BP 130/82 | HR 81 | Temp 98.2°F | Resp 16 | Wt 175.0 lb

## 2016-01-05 DIAGNOSIS — E782 Mixed hyperlipidemia: Secondary | ICD-10-CM | POA: Diagnosis not present

## 2016-01-05 DIAGNOSIS — I4891 Unspecified atrial fibrillation: Secondary | ICD-10-CM

## 2016-01-05 DIAGNOSIS — Z13820 Encounter for screening for osteoporosis: Secondary | ICD-10-CM

## 2016-01-05 DIAGNOSIS — E119 Type 2 diabetes mellitus without complications: Secondary | ICD-10-CM | POA: Diagnosis not present

## 2016-01-05 DIAGNOSIS — D508 Other iron deficiency anemias: Secondary | ICD-10-CM

## 2016-01-05 DIAGNOSIS — I10 Essential (primary) hypertension: Secondary | ICD-10-CM

## 2016-01-05 DIAGNOSIS — I2510 Atherosclerotic heart disease of native coronary artery without angina pectoris: Secondary | ICD-10-CM

## 2016-01-05 LAB — CBC WITH DIFFERENTIAL/PLATELET
BASOS PCT: 1 % (ref 0.0–3.0)
Basophils Absolute: 0.1 10*3/uL (ref 0.0–0.1)
EOS PCT: 1.7 % (ref 0.0–5.0)
Eosinophils Absolute: 0.1 10*3/uL (ref 0.0–0.7)
HCT: 43.7 % (ref 36.0–46.0)
HEMOGLOBIN: 14.6 g/dL (ref 12.0–15.0)
Lymphocytes Relative: 29.5 % (ref 12.0–46.0)
Lymphs Abs: 1.8 10*3/uL (ref 0.7–4.0)
MCHC: 33.5 g/dL (ref 30.0–36.0)
MCV: 87.6 fl (ref 78.0–100.0)
MONO ABS: 0.7 10*3/uL (ref 0.1–1.0)
MONOS PCT: 11.7 % (ref 3.0–12.0)
Neutro Abs: 3.5 10*3/uL (ref 1.4–7.7)
Neutrophils Relative %: 56.1 % (ref 43.0–77.0)
Platelets: 201 10*3/uL (ref 150.0–400.0)
RBC: 4.99 Mil/uL (ref 3.87–5.11)
RDW: 13.7 % (ref 11.5–15.5)
WBC: 6.2 10*3/uL (ref 4.0–10.5)

## 2016-01-05 LAB — COMPREHENSIVE METABOLIC PANEL
ALK PHOS: 71 U/L (ref 39–117)
ALT: 17 U/L (ref 0–35)
AST: 19 U/L (ref 0–37)
Albumin: 4.3 g/dL (ref 3.5–5.2)
BILIRUBIN TOTAL: 1.1 mg/dL (ref 0.2–1.2)
BUN: 10 mg/dL (ref 6–23)
CALCIUM: 9.5 mg/dL (ref 8.4–10.5)
CO2: 30 mEq/L (ref 19–32)
Chloride: 105 mEq/L (ref 96–112)
Creatinine, Ser: 0.6 mg/dL (ref 0.40–1.20)
GFR: 103.31 mL/min (ref >60.00)
Glucose, Bld: 163 mg/dL — ABNORMAL HIGH (ref 70–99)
POTASSIUM: 4.3 meq/L (ref 3.5–5.1)
Sodium: 142 mEq/L (ref 135–145)
TOTAL PROTEIN: 7 g/dL (ref 6.0–8.3)

## 2016-01-05 LAB — MICROALBUMIN / CREATININE URINE RATIO
CREATININE, U: 136.2 mg/dL
MICROALB UR: 1.5 mg/dL (ref 0.0–1.9)
Microalb Creat Ratio: 1.1 mg/g (ref 0.0–30.0)

## 2016-01-05 LAB — LIPID PANEL
CHOL/HDL RATIO: 3
Cholesterol: 149 mg/dL (ref 0–200)
HDL: 51.6 mg/dL (ref >39.00)
NONHDL: 97.52
Triglycerides: 217 mg/dL — ABNORMAL HIGH (ref 0.0–149.0)
VLDL: 43.4 mg/dL — AB (ref 0.0–40.0)

## 2016-01-05 LAB — HEMOGLOBIN A1C: HEMOGLOBIN A1C: 8.5 % — AB (ref 4.6–6.5)

## 2016-01-05 LAB — LDL CHOLESTEROL, DIRECT: Direct LDL: 65 mg/dL

## 2016-01-05 MED ORDER — METFORMIN HCL ER 500 MG PO TB24: 1000.0000 mg | ORAL_TABLET | Freq: Every day | ORAL | Status: DC

## 2016-01-05 MED FILL — METFORMIN HCL ER 500 MG TAB: 500 | 90 days supply | Qty: 180 | Fill #0

## 2016-01-05 NOTE — Assessment & Plan Note (Signed)
Has not been taking metformin consistently because she does not tolerate it secondary to diet. She thinks she can tolerate 2 pills daily-restart Will check A1c today, but is slightly elevated we'll wait 3 months before adjusting medication Stressed increasing her walking-currently walking with her dog Not compliant with a diabetic diet and encouraged her to decrease sweet intake

## 2016-01-05 NOTE — Assessment & Plan Note (Signed)
Taking atorvastatin Check lipid panel Continue above

## 2016-01-05 NOTE — Progress Notes (Signed)
Pre visit review using our clinic review tool, if applicable. No additional management support is needed unless otherwise documented below in the visit note. 

## 2016-01-05 NOTE — Progress Notes (Signed)
Subjective:    Patient ID: Anita Duke, female    DOB: March 29, 1941, 75 y.o.   MRN: [id number]  HPI She is here for follow up.  Diabetes: She is taking her medication daily as prescribed. She is not compliant with a diabetic diet. She is walking regularly with her dog. She does not monitor her sugars. She checks her feet daily and denies foot lesions. She is up-to-date with an ophthalmology examination.   Hypertension, CAD: She is taking her medication daily. She is not compliant with a low sodium diet.  She denies chest pain, palpitations, edema, shortness of breath and regular headaches. She is walking regularly with her dog.  She does not monitor her blood pressure at home.    Hyperlipidemia: She is taking her medication daily. She is not compliant with a low fat/cholesterol diet. She is walking regularly with her dog. She denies myalgias.    Medications and allergies reviewed with patient and updated if appropriate.  Patient Active Problem List   Diagnosis Date Noted  . Encounter for therapeutic drug monitoring 10/08/2015  . Heat intolerance 10/08/2015  . Obesity 12/02/2014  . CAD (coronary artery disease) 05/28/2014  . Vitamin D deficiency 02/20/2014  . Long term current use of anticoagulant therapy 11/24/2013  . Chronic systolic dysfunction of left ventricle 05/21/2013  . 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 Anemia   . Acquired complete AV block   . Sick sinus syndrome ([place])   . Pacemaker-Medtronic 05/20/2012  . Allergic rhinitis 04/25/2012  . Mild intermittent asthma 02/16/2011  . Ejection fraction < 50%   . Atrial fibrillation ([employer])   . Mitral regurgitation   . Right ventricular dysfunction   . Mitral stenosis   . S/P mitral valve repair   . Hx of CABG   . T2_NIDDM 03/02/2009  . Hyperlipidemia 07/07/2008  . Essential hypertension 07/07/2008  . Diverticulosis of large intestine 08/04/2002    Current Outpatient Prescriptions on File Prior to  Visit  Medication Sig Dispense Refill  . atorvastatin (LIPITOR) 40 MG tablet Take 1 tablet by mouth  daily 90 tablet 3  . blood glucose meter kit and supplies KIT Dispense based on patient and insurance preference. Check sugars daily. NIDDM 1 each 0  . calcium carbonate (OS-CAL) 600 MG TABS Take 600 mg by mouth daily.     . carvedilol (COREG) 6.25 MG tablet Take 1 tablet by mouth  twice a day with meals 180 tablet 0  . cholecalciferol (VITAMIN D) 1000 UNITS tablet Take 6,000 Units by mouth daily.     . diphenhydramine-acetaminophen (TYLENOL PM) 25-500 MG TABS tablet Take 1 tablet by mouth at bedtime as needed. 14 tablet   . fish oil-omega-3 fatty acids 1000 MG capsule Take 1 g by mouth 2 (two) times daily.     . furosemide (LASIX) 40 MG tablet Take 0.5 tablets (20 mg total) by mouth daily. 90 tablet 1  . loperamide (IMODIUM) 2 MG capsule Take 1 capsule (2 mg total) by mouth daily as needed for diarrhea or loose stools. 30 capsule 10  . loratadine (CLARITIN) 10 MG tablet Take 10 mg by mouth daily.    . potassium chloride SA (K-DUR,KLOR-CON) 20 MEQ tablet Take 3 tablets by mouth  daily 90 tablet 1  . trandolapril (MAVIK) 4 MG tablet Take 1 tablet by mouth two  times daily for blood  pressure 180 tablet 3  . venlafaxine XR (EFFEXOR-XR) 37.5 MG 24 hr capsule Take  1 capsule by mouth  every evening 90 capsule 0  . warfarin (COUMADIN) 2.5 MG tablet Take as directed by Anticoagulation Clinic 100 tablet 1   No current facility-administered medications on file prior to visit.    Past Medical History  Diagnosis Date  . Mitral regurgitation     Treated with repair, atrial appendage was removed or tied off at surgery.. this was proven by TEE February, 2011  . Pulmonary HTN ([place])     55 mmHg, echo, February, 2011 / no mention of pulmonary hypertension echo, June, 2011  . Right ventricular dysfunction     Mild to moderate, echo, February, 2011 / normalized echo, June, 2011  . TR (tricuspid  regurgitation)     Moderate, echo, February, 2011 / trivial, echo, June, 2011  . Mitral stenosis     Mild, February, 2011, post mitral valve repair / mild, echo, June, 2011  . CAD (coronary artery disease)     LIMA to the LAD at time of mitral valve repair / LIMA atretic,, February, 2011  . Ejection fraction < 50%     EF 30% echo and TEE diagnosis February, 2011, possibly rate related / no contraction by catheter, bradycardia so carvedilol cannot be used / EF 55-60% echo, June, 2011 / EF 50%, echo, February, 2012  . Hemorrhoids, internal   . Diverticulosis of colon   . Colon polyp, hyperplastic   . H/O amiodarone therapy     Satrted 02/12/2012  . High cholesterol   . Asthma   . COPD (chronic obstructive pulmonary disease) ([place])   . HTN (hypertension)     takes Mavik daily  . Myocardial infarction [facility]) 04/2012  ?  . CHF (congestive heart failure) ([place]) 2011    Mild in hospital February, 2011;takes Lasix daily  . Seasonal allergies     takes Claritin daily  . Type II diabetes mellitus (H[facility])     takes Metformin daily  . Cataracts, bilateral     immature  . Depression     takes Effexor daily  . Atrial fibrillation ([place])     Treated with maze procedure / recurrent atrial fibrillation February, 2011... TEE cardioversion October 19, 2009; s/p AVN ablatin  . QT prolongation     Tikosyn and Effexor. QT prolonged October 13, 2011, peak is in dose reduced from 500  to -250 twice a day  . Anemia     Hemoglobin 10.4, December, 2013  . GERD (gastroesophageal reflux disease)   . Arthritis     "not bad; little in my hands; some in my knees" (11/04/2013)  . Cecal cancer ([place]) 2014    Past Surgical History  Procedure Laterality Date  . Mitral valve repair  2001    "anterior and posterior leaflets" (12/04/2012)  . Maze  2001    w/ MVR & CABG  . Cardioversion  02/08/2012    Procedure: CARDIOVERSION;  Surgeon: Carlena Bjornstad, MD;  Location: [place];  Service: Cardiovascular;  Laterality: N/A;  .  Cardioversion  02/29/2012    Procedure: CARDIOVERSION;  Surgeon: Carlena Bjornstad, MD;  Location: [place];  Service: Cardiovascular;  Laterality: N/A;  . Cardioversion  05/15/2012    Procedure: CARDIOVERSION;  Surgeon: Carlena Bjornstad, MD;  Location: [facility] ENDOSCOPY;  Service: Cardiovascular;  Laterality: N/A;  . Tonsillectomy and adenoidectomy  ~ 1950  . 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 Av node ablation  05/28/12  .  Cardiac catheterization  05/2012  . Laparoscopic right hemi colectomy  12/03/2012    Procedure: LAPAROSCOPIC RIGHT HEMI COLECTOMY;  Surgeon: Stark Klein, MD;  Location: [place];  Service: General;;  . Umbilical hernia repair N/A 12/03/2012    Procedure: HERNIA REPAIR UMBILICAL ADULT;  Surgeon: Stark Klein, MD;  Location: [place];  Service: General;  Laterality: N/A;  . Diagnostic laparoscopic liver biopsy Left 12/03/2012    Procedure: DIAGNOSTIC LAPAROSCOPIC LIVER BIOPSY;  Surgeon: Stark Klein, MD;  Location: [place];  Service: General;  Laterality: Left;  . Coronary artery bypass graft  2001    CABG X1 "at time of mitral valve repair" (12/04/2012  . Breast lumpectomy Bilateral 1990's  . Cholecystectomy  1995  . Abdominal hysterectomy  ?2002  . Bi-ventricular pacemaker upgrade  11/04/2013    upgrade of previously implanted dual chamber pacemaker to STJ Quadra Allure CRTP by Dr Rayann Heman  . Permanent pacemaker insertion N/A 05/17/2012    Procedure: PERMANENT PACEMAKER INSERTION;  Surgeon: Thompson Grayer, MD;  Location: [facility] C[facility] LAB;  Service: Cardiovascular;  Laterality: N/A;  . Av node ablation N/A 05/28/2012    Procedure: AV NODE ABLATION;  Surgeon: Thompson Grayer, MD;  Location: [facility] CATH LAB;  Service: Cardiovascular;  Laterality: N/A;  . Bi-ventricular pacemaker upgrade N/A 11/04/2013    Procedure: BI-VENTRICULAR PACEMAKER UPGRADE;  Surgeon: Coralyn Mark, MD;  Location: [place] CATH LAB;  Service: Cardiovascular;  Laterality: N/A;  . Cataract  extraction, bilateral  2017    Social History   Social History  . Marital Status: Married    Spouse Name: N/A  . Number of Children: 0  . 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    [employer]     Social History Main Topics  . Smoking status: Never Smoker   . Smokeless tobacco: Never Used  . Alcohol Use: No  . Drug Use: No  . Sexual Activity: No   Other Topics Concern  . Not on file   Social History Narrative   Does not et regular exercise   Daily caffeine use          Family History  Problem Relation Age of Onset  . Pancreatic cancer Maternal Grandfather   . Heart disease Father   . Diabetes Father   . Heart disease Sister     x 2  . Diabetes Mother   . Kidney disease Mother   . Colon cancer Neg Hx   . Colon polyps Neg Hx   . Diabetes Paternal Grandfather   . Diabetes Paternal Grandmother   . Heart disease Paternal Uncle     x 6  . Prostate cancer Paternal Uncle     Review of Systems  Constitutional: Negative for fever and chills.  HENT: Positive for postnasal drip.   Respiratory: Positive for cough (dry, allergies). Negative for shortness of breath and wheezing.   Cardiovascular: Negative for chest pain, palpitations and leg swelling.  Gastrointestinal: Negative for abdominal pain.  Neurological: Negative for dizziness, light-headedness and headaches.       Objective:   Filed Vitals:   01/05/16 0831  BP: 130/82  Pulse: 81  Temp: 98.2 F (36.8 C)  Resp: 16   Filed Weights   01/05/16 0831  Weight: 175 lb (79.379 kg)   Body mass index is 31.48 kg/(m^2).   Physical Exam Constitutional: Appears well-developed and well-nourished. No distress.  Neck: Neck supple. No tracheal deviation  present. No thyromegaly present.  No carotid bruit. No cervical adenopathy.   Cardiovascular: Normal rate, regular rhythm and normal heart sounds.   No murmur heard.  No edema Pulmonary/Chest: Effort normal and breath sounds  normal. No respiratory distress. No wheezes.       Assessment & Plan:   See Problem List for Assessment and Plan of chronic medical problems.  F/u in 3 months-Physical exam with me, wellness with Manuela Schwartz

## 2016-01-05 NOTE — Assessment & Plan Note (Signed)
No symptoms Continue statin, BB, warfarin

## 2016-01-05 NOTE — Patient Instructions (Signed)
  Test(s) ordered today. Your results will be released to [place] (or called to you) after review, usually within 72hours after test completion. If any changes need to be made, you will be notified at that same time.  All other Health Maintenance issues reviewed.   All recommended immunizations and age-appropriate screenings are up-to-date or discussed.  No immunizations administered today.   Medications reviewed and updated.  Changes include restarting metformin.   Your prescription(s) have been submitted to your pharmacy. Please take as directed and contact our office if you believe you are having problem(s) with the medication(s).  Please followup in 3 months for a physical

## 2016-01-05 NOTE — Assessment & Plan Note (Signed)
Rate controlled  Asymptomatic On warfarin, coreg

## 2016-01-05 NOTE — Assessment & Plan Note (Signed)
BP well controlled Current regimen effective and well tolerated Continue current medications at current doses cmp  

## 2016-01-05 NOTE — Assessment & Plan Note (Signed)
Check CBC 

## 2016-01-06 ENCOUNTER — Encounter: Payer: Self-pay | Admitting: Internal Medicine

## 2016-01-06 DIAGNOSIS — H2511 Age-related nuclear cataract, right eye: Secondary | ICD-10-CM | POA: Diagnosis not present

## 2016-01-06 DIAGNOSIS — H268 Other specified cataract: Secondary | ICD-10-CM | POA: Diagnosis not present

## 2016-01-06 DIAGNOSIS — H25811 Combined forms of age-related cataract, right eye: Secondary | ICD-10-CM | POA: Diagnosis not present

## 2016-01-10 ENCOUNTER — Encounter: Payer: Self-pay | Admitting: Internal Medicine

## 2016-01-10 DIAGNOSIS — M8580 Other specified disorders of bone density and structure, unspecified site: Secondary | ICD-10-CM | POA: Insufficient documentation

## 2016-01-12 ENCOUNTER — Ambulatory Visit (INDEPENDENT_AMBULATORY_CARE_PROVIDER_SITE_OTHER): Payer: Medicare Other | Admitting: *Deleted

## 2016-01-12 DIAGNOSIS — I495 Sick sinus syndrome: Secondary | ICD-10-CM | POA: Diagnosis not present

## 2016-01-12 DIAGNOSIS — Z950 Presence of cardiac pacemaker: Secondary | ICD-10-CM

## 2016-01-12 DIAGNOSIS — I509 Heart failure, unspecified: Secondary | ICD-10-CM

## 2016-01-12 NOTE — Progress Notes (Signed)
Remote pacemaker transmission.   

## 2016-01-13 NOTE — Progress Notes (Signed)
EPIC Encounter for ICM Monitoring  Patient Name: Anita Duke is a 75 y.o. female Date: 01/13/2016 Primary Care Physican: Binnie Rail, MD Primary Cardiologist: Irish Lack Electrophysiologist: Allred Dry Weight: unknown  Bi-V Pacing 98%      In the past month, have you:  1. Gained more than 2 pounds in a day or more than 5 pounds in a week? N/A  2. Had changes in your medications (with verification of current medications)? N/A  3. Had more shortness of breath than is usual for you? N/A  4. Limited your activity because of shortness of breath? N/A  5. Not been able to sleep because of shortness of breath? N/A  6. Had increased swelling in your feet, ankles, legs or stomach area? N/A  7. Had symptoms of dehydration (dizziness, dry mouth, increased thirst, decreased urine output) N/A  8. Had changes in sodium restriction? N/A  9. Been compliant with medication? N/A  ICM trend: 3 month view for 01/12/2016   ICM trend: 1 year view for 01/12/2016   Follow-up plan: ICM clinic phone appointment 02/16/2016.  Attempted patient call and unable to reach.  Transmission reviewed.   FLUID LEVELS: [place] thoracic impedance decreased 12/19/2015 to 12/28/2015, 01/01/2016 to 01/08/2016 suggesting fluid accumulation and returned to baseline 01/09/2016.     [name], RN, CCM 01/13/2016 2:46 PM

## 2016-01-14 DIAGNOSIS — L812 Freckles: Secondary | ICD-10-CM | POA: Diagnosis not present

## 2016-01-14 DIAGNOSIS — L821 Other seborrheic keratosis: Secondary | ICD-10-CM | POA: Diagnosis not present

## 2016-01-14 DIAGNOSIS — Z85828 Personal history of other malignant neoplasm of skin: Secondary | ICD-10-CM | POA: Diagnosis not present

## 2016-01-14 DIAGNOSIS — L820 Inflamed seborrheic keratosis: Secondary | ICD-10-CM | POA: Diagnosis not present

## 2016-01-18 ENCOUNTER — Encounter: Payer: Self-pay | Admitting: Internal Medicine

## 2016-01-19 ENCOUNTER — Ambulatory Visit (INDEPENDENT_AMBULATORY_CARE_PROVIDER_SITE_OTHER): Payer: Medicare Other | Admitting: Gastroenterology

## 2016-01-19 ENCOUNTER — Encounter: Payer: Self-pay | Admitting: Gastroenterology

## 2016-01-19 VITALS — BP 130/70 | HR 68 | Ht 62.5 in | Wt 174.2 lb

## 2016-01-19 DIAGNOSIS — Z85038 Personal history of other malignant neoplasm of large intestine: Secondary | ICD-10-CM

## 2016-01-19 NOTE — Patient Instructions (Signed)
I will communicate with Dr. Barry Dienes about timing of your next colonoscopy (now or in 2018).

## 2016-01-19 NOTE — Progress Notes (Signed)
Review of pertinent gastrointestinal problems: 1. Colon cancer, T2 N0 M0 cecal cancer diagnosed 4 2014 during colonoscopy with Dr. Deatra Ina. She underwent laparoscopic "partial colectomy" 4 2014 with Dr. Barry Dienes. Colonoscopy 01/2014 Dr. Deatra Ina showed essentially normal anastomosis, 2 subCM adenomas were also removed. He recommended repeat colonoscopy at 3 year interval  HPI: This is a  very pleasant 75 year old woman whom I am meeting for the first time today. Previously she was a patient of Dr. Erskine Emery.  Chief complaint is previous colon cancer, timing of next screening examination?  She saw Dr. Barry Dienes earlier this year and seems to feel that she wanted another one sooner than 3 years from her 5 one.  She has had no bleeding, no bowel changes no significant weight changes.    Past Medical History  Diagnosis Date  . Mitral regurgitation     Treated with repair, atrial appendage was removed or tied off at surgery.. this was proven by TEE February, 2011  . Pulmonary HTN ([place])     55 mmHg, echo, February, 2011 / no mention of pulmonary hypertension echo, June, 2011  . Right ventricular dysfunction     Mild to moderate, echo, February, 2011 / normalized echo, June, 2011  . TR (tricuspid regurgitation)     Moderate, echo, February, 2011 / trivial, echo, June, 2011  . Mitral stenosis     Mild, February, 2011, post mitral valve repair / mild, echo, June, 2011  . CAD (coronary artery disease)     LIMA to the LAD at time of mitral valve repair / LIMA atretic,, February, 2011  . Ejection fraction < 50%     EF 30% echo and TEE diagnosis February, 2011, possibly rate related / no contraction by catheter, bradycardia so carvedilol cannot be used / EF 55-60% echo, June, 2011 / EF 50%, echo, February, 2012  . Hemorrhoids, internal   . Diverticulosis of colon   . Colon polyp, hyperplastic   . H/O amiodarone therapy     Satrted 02/12/2012  . High cholesterol   . Asthma   . COPD (chronic  obstructive pulmonary disease) ([place])   . HTN (hypertension)     takes Mavik daily  . Myocardial infarction [facility]) 04/2012  ?  . CHF (congestive heart failure) ([place]) 2011    Mild in hospital February, 2011;takes Lasix daily  . Seasonal allergies     takes Claritin daily  . Type II diabetes mellitus (H[facility])     takes Metformin daily  . Cataracts, bilateral     immature  . Depression     takes Effexor daily  . Atrial fibrillation ([place])     Treated with maze procedure / recurrent atrial fibrillation February, 2011... TEE cardioversion October 19, 2009; s/p AVN ablatin  . QT prolongation     Tikosyn and Effexor. QT prolonged October 13, 2011, peak is in dose reduced from 500  to -250 twice a day  . Anemia     Hemoglobin 10.4, December, 2013  . GERD (gastroesophageal reflux disease)   . Arthritis     "not bad; little in my hands; some in my knees" (11/04/2013)  . Cecal cancer ([place]) 2014    Past Surgical History  Procedure Laterality Date  . Mitral valve repair  2001    "anterior and posterior leaflets" (12/04/2012)  . Maze  2001    w/ MVR & CABG  . Cardioversion  02/08/2012    Procedure: CARDIOVERSION;  Surgeon: Carlena Bjornstad, MD;  Location: [place];  Service: Cardiovascular;  Laterality: N/A;  . Cardioversion  02/29/2012    Procedure: CARDIOVERSION;  Surgeon: Carlena Bjornstad, MD;  Location: [place];  Service: Cardiovascular;  Laterality: N/A;  . Cardioversion  05/15/2012    Procedure: CARDIOVERSION;  Surgeon: Carlena Bjornstad, MD;  Location: [facility] ENDOSCOPY;  Service: Cardiovascular;  Laterality: N/A;  . Tonsillectomy and adenoidectomy  ~ 1950  . 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 Av node ablation  05/28/12  . Cardiac catheterization  05/2012  . Laparoscopic right hemi colectomy  12/03/2012    Procedure: LAPAROSCOPIC RIGHT HEMI COLECTOMY;  Surgeon: Stark Klein, MD;  Location: [place];  Service: General;;  . Umbilical hernia repair N/A  12/03/2012    Procedure: HERNIA REPAIR UMBILICAL ADULT;  Surgeon: Stark Klein, MD;  Location: [place];  Service: General;  Laterality: N/A;  . Diagnostic laparoscopic liver biopsy Left 12/03/2012    Procedure: DIAGNOSTIC LAPAROSCOPIC LIVER BIOPSY;  Surgeon: Stark Klein, MD;  Location: [place];  Service: General;  Laterality: Left;  . Coronary artery bypass graft  2001    CABG X1 "at time of mitral valve repair" (12/04/2012  . Breast lumpectomy Bilateral 1990's  . Cholecystectomy  1995  . Abdominal hysterectomy  ?2002  . Bi-ventricular pacemaker upgrade  11/04/2013    upgrade of previously implanted dual chamber pacemaker to STJ Quadra Allure CRTP by Dr Rayann Heman  . Permanent pacemaker insertion N/A 05/17/2012    Procedure: PERMANENT PACEMAKER INSERTION;  Surgeon: Thompson Grayer, MD;  Location: [facility] CATH LAB;  Service: Cardiovascular;  Laterality: N/A;  . Av node ablation N/A 05/28/2012    Procedure: AV NODE ABLATION;  Surgeon: Thompson Grayer, MD;  Location: [facility] CATH LAB;  Service: Cardiovascular;  Laterality: N/A;  . Bi-ventricular pacemaker upgrade N/A 11/04/2013    Procedure: BI-VENTRICULAR PACEMAKER UPGRADE;  Surgeon: Coralyn Mark, MD;  Location: [place] CATH LAB;  Service: Cardiovascular;  Laterality: N/A;  . Cataract extraction, bilateral  2017    Current Outpatient Prescriptions  Medication Sig Dispense Refill  . atorvastatin (LIPITOR) 40 MG tablet Take 1 tablet by mouth  daily 90 tablet 3  . blood glucose meter kit and supplies KIT Dispense based on patient and insurance preference. Check sugars daily. NIDDM 1 each 0  . calcium carbonate (OS-CAL) 600 MG TABS Take 600 mg by mouth daily.     . carvedilol (COREG) 6.25 MG tablet Take 1 tablet by mouth  twice a day with meals 180 tablet 0  . cholecalciferol (VITAMIN D) 1000 UNITS tablet Take 6,000 Units by mouth daily.     . diphenhydramine-acetaminophen (TYLENOL PM) 25-500 MG TABS tablet Take 1 tablet by mouth at bedtime as needed. 14 tablet   . fish oil-omega-3  fatty acids 1000 MG capsule Take 1 g by mouth 2 (two) times daily.     . furosemide (LASIX) 40 MG tablet Take 0.5 tablets (20 mg total) by mouth daily. 90 tablet 1  . loperamide (IMODIUM) 2 MG capsule Take 1 capsule (2 mg total) by mouth daily as needed for diarrhea or loose stools. 30 capsule 10  . loratadine (CLARITIN) 10 MG tablet Take 10 mg by mouth daily.    . metFORMIN (GLUCOPHAGE XR) 500 MG 24 hr tablet Take 2 tablets (1,000 mg total) by mouth daily with breakfast. 180 tablet 3  . potassium chloride SA (K-DUR,KLOR-CON) 20 MEQ tablet Take 3 tablets by mouth  daily 90 tablet 1  .  trandolapril (MAVIK) 4 MG tablet Take 1 tablet by mouth two  times daily for blood  pressure 180 tablet 3  . venlafaxine XR (EFFEXOR-XR) 37.5 MG 24 hr capsule Take 1 capsule by mouth  every evening 90 capsule 0  . warfarin (COUMADIN) 2.5 MG tablet Take as directed by Anticoagulation Clinic 100 tablet 1   No current facility-administered medications for this visit.    Allergies as of 01/19/2016 - Review Complete 01/19/2016  Allergen Reaction Noted  . Demerol [meperidine] Nausea And Vomiting 10/31/2012  . Morphine Other (See Comments)   . Januvia [sitagliptin]  01/05/2016  . Tetanus toxoid Rash     Family History  Problem Relation Age of Onset  . Pancreatic cancer Maternal Grandfather   . Heart disease Father   . Diabetes Father   . Heart disease Sister     x 2  . Diabetes Mother   . Kidney disease Mother   . Colon cancer Neg Hx   . Colon polyps Neg Hx   . Diabetes Paternal Grandfather   . Diabetes Paternal Grandmother   . Heart disease Paternal Uncle     x 6  . Prostate cancer Paternal Uncle     Social History   Social History  . Marital Status: Married    Spouse Name: N/A  . Number of Children: 0  . 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    [employer]     Social History Main Topics  . Smoking status: Never Smoker   . Smokeless tobacco: Never Used   . Alcohol Use: No  . Drug Use: No  . Sexual Activity: No   Other Topics Concern  . Not on file   Social History Narrative   Does not et regular exercise   Daily caffeine use           Physical Exam: BP 130/70 mmHg  Pulse 68  Ht 5' 2.5" (1.588 m)  Wt 174 lb 4 oz (79.039 kg)  BMI 31.34 kg/m2 Constitutional: generally well-appearing Psychiatric: alert and oriented x3 Abdomen: soft, nontender, nondistended, no obvious ascites, no peritoneal signs, normal bowel sounds   Assessment and plan: 75 y.o. female with Personal history of colon cancer  She had a T2 N0 M0 cecal cancer resected April 2014. Margins were clear. Colonoscopy 1 year later, May 2015, by Dr. Deatra Ina found 2 small adenomas, the anastomosis was normal. He recommended repeat colonoscopy at 3 year interval. The 3 year interval is general protocol that we follow after the first one year examination following colon cancer resection. Ms. Self was under the feeling that Dr. Barry Dienes wanted one sooner than the usual 3 year interval. I will can make it with Dr. Barry Dienes to see if that is indeed true. If so I'm happy to do this for her sooner than May 2018 which is one we are planning for her next colonoscopy previously.   Owens Loffler, MD [facility] 01/19/2016, 10:46 AM

## 2016-01-20 ENCOUNTER — Telehealth: Payer: Self-pay

## 2016-01-20 NOTE — Telephone Encounter (Signed)
Pt has been notified and will call prior to then with any problems

## 2016-01-20 NOTE — Telephone Encounter (Signed)
-----   Message from Milus Banister, MD sent at 01/20/2016  7:04 AM EDT ----- Chong Sicilian, Please let her know that I discussed with Dr. Barry Dienes. Next colonoscopy should be 01/2017.  She is probably already in recall system but would check to make sure.  Thanks   ----- Message -----    From: Stark Klein, MD    Sent: 01/19/2016  12:06 PM      To: Milus Banister, MD  As long as we are on track, I am OK with that. tx FB  ----- Message -----    From: Milus Banister, MD    Sent: 01/19/2016  11:00 AM      To: Stark Klein, MD  Dorris Fetch,  I think I canceled the previous message before sending it. If this is the second one you are receiving I apologize.    She had a T2 N0 M0 cecal cancer resected by you in 2014. Colonoscopy 1 year later in May 2015 by Dr. Deatra Ina was normal except for 2 small polyps. Generally we recommend next colonoscopy to be done at 3 year interval which would be May 2018. She tells me when she saw you last several months ago she was under the impression that he wanted it done sooner than that. I am happy to do it sooner if that is indeed the case but I want to clarify.  Please let me know. If you see no reason to do it sooner and it was just a miscommunication then we will go ahead with our current plan of repeat colonoscopy May 2018.  Thanks much

## 2016-01-20 NOTE — Telephone Encounter (Signed)
Recall in EPIC and Left message on machine to call back

## 2016-01-24 ENCOUNTER — Other Ambulatory Visit: Payer: Self-pay | Admitting: Internal Medicine

## 2016-01-24 NOTE — Telephone Encounter (Signed)
Rx(s) sent to pharmacy electronically.  

## 2016-01-29 ENCOUNTER — Other Ambulatory Visit: Payer: Self-pay | Admitting: Cardiology

## 2016-01-29 ENCOUNTER — Other Ambulatory Visit: Payer: Self-pay | Admitting: Internal Medicine

## 2016-02-09 ENCOUNTER — Encounter: Payer: Self-pay | Admitting: Cardiology

## 2016-02-11 ENCOUNTER — Ambulatory Visit (INDEPENDENT_AMBULATORY_CARE_PROVIDER_SITE_OTHER): Payer: Medicare Other | Admitting: General Practice

## 2016-02-11 DIAGNOSIS — Z5181 Encounter for therapeutic drug level monitoring: Secondary | ICD-10-CM | POA: Diagnosis not present

## 2016-02-11 DIAGNOSIS — I4891 Unspecified atrial fibrillation: Secondary | ICD-10-CM

## 2016-02-11 LAB — POCT INR: INR: 1.8

## 2016-02-11 NOTE — Progress Notes (Signed)
Pre visit review using our clinic review tool, if applicable. No additional management support is needed unless otherwise documented below in the visit note. 

## 2016-02-16 ENCOUNTER — Telehealth: Payer: Self-pay

## 2016-02-16 ENCOUNTER — Ambulatory Visit (INDEPENDENT_AMBULATORY_CARE_PROVIDER_SITE_OTHER): Payer: Medicare Other

## 2016-02-16 DIAGNOSIS — I509 Heart failure, unspecified: Secondary | ICD-10-CM | POA: Diagnosis not present

## 2016-02-16 DIAGNOSIS — Z950 Presence of cardiac pacemaker: Secondary | ICD-10-CM | POA: Diagnosis not present

## 2016-02-16 NOTE — Progress Notes (Signed)
EPIC Encounter for ICM Monitoring  Patient Name: Anita Duke is a 75 y.o. female Date: 02/16/2016 Primary Care Physican: Binnie Rail, MD Primary Cardiologist: Irish Lack Electrophysiologist: Allred Dry Weight: unknown   Bi-V Pacing 98%      In the past month, have you:  1. Gained more than 2 pounds in a day or more than 5 pounds in a week? N/A  2. Had changes in your medications (with verification of current medications)? N/A  3. Had more shortness of breath than is usual for you? N/A  4. Limited your activity because of shortness of breath? N/A  5. Not been able to sleep because of shortness of breath? N/A  6. Had increased swelling in your feet or ankles? N/A  7. Had symptoms of dehydration (dizziness, dry mouth, increased thirst, decreased urine output) N/A  8. Had changes in sodium restriction? N/A  9. Been compliant with medication? N/A   ICM trend: 3 month view for 02/16/2016   ICM trend: 1 year view for 02/16/2016   Follow-up plan: ICM clinic phone appointment on 04/11/2016 and office appointment with Chanetta Marshall, NP on 03/02/2016.  Attempted call to patient and unable to reach.  Transmission reviewed.  Thoracic impedance below reference line from 01/14/2016 to 01/22/2016 and 02/09/2016 to 02/14/2016 suggesting fluid accumulation and returned to baseline 02/14/2016 suggesting fluid levels are stablizing.     [name], RN, CCM 02/16/2016 12:20 PM

## 2016-02-16 NOTE — Telephone Encounter (Signed)
Remote ICM transmission received.  Attempted patient call and left message for return call.   

## 2016-02-17 LAB — CUP PACEART REMOTE DEVICE CHECK
Battery Remaining Longevity: 92 mo
Implantable Lead Implant Date: 20130913
Implantable Lead Implant Date: 20130913
Implantable Lead Implant Date: 20150303
Implantable Lead Model: 5092
Lead Channel Impedance Value: 480 Ohm
Lead Channel Pacing Threshold Amplitude: 0.625 V
Lead Channel Pacing Threshold Pulse Width: 0.4 ms
Lead Channel Sensing Intrinsic Amplitude: 10.8 mV
Lead Channel Setting Pacing Amplitude: 2 V
Lead Channel Setting Pacing Amplitude: 2.375
Lead Channel Setting Pacing Pulse Width: 0.4 ms
MDC IDC LEAD LOCATION: 753858
MDC IDC LEAD LOCATION: 753859
MDC IDC LEAD LOCATION: 753860
MDC IDC MSMT BATTERY REMAINING PERCENTAGE: 95.5 %
MDC IDC MSMT BATTERY VOLTAGE: 2.98 V
MDC IDC MSMT LEADCHNL LV IMPEDANCE VALUE: 450 Ohm
MDC IDC MSMT LEADCHNL LV PACING THRESHOLD AMPLITUDE: 1.375 V
MDC IDC MSMT LEADCHNL LV PACING THRESHOLD PULSEWIDTH: 0.6 ms
MDC IDC PG MODEL: 3242
MDC IDC PG SERIAL: 7548835
MDC IDC SESS DTM: 20170510060014
MDC IDC SET LEADCHNL LV PACING PULSEWIDTH: 0.6 ms
MDC IDC SET LEADCHNL RV SENSING SENSITIVITY: 3 mV

## 2016-02-25 ENCOUNTER — Encounter: Payer: Self-pay | Admitting: Cardiology

## 2016-02-26 ENCOUNTER — Other Ambulatory Visit: Payer: Self-pay | Admitting: Cardiology

## 2016-03-01 ENCOUNTER — Other Ambulatory Visit: Payer: Self-pay

## 2016-03-02 ENCOUNTER — Encounter: Payer: Self-pay | Admitting: Internal Medicine

## 2016-03-02 ENCOUNTER — Encounter: Payer: Self-pay | Admitting: Nurse Practitioner

## 2016-03-02 ENCOUNTER — Ambulatory Visit (INDEPENDENT_AMBULATORY_CARE_PROVIDER_SITE_OTHER): Payer: Medicare Other | Admitting: Nurse Practitioner

## 2016-03-02 VITALS — BP 138/74 | HR 72 | Ht 63.0 in | Wt 173.2 lb

## 2016-03-02 DIAGNOSIS — I482 Chronic atrial fibrillation: Secondary | ICD-10-CM | POA: Diagnosis not present

## 2016-03-02 DIAGNOSIS — I5022 Chronic systolic (congestive) heart failure: Secondary | ICD-10-CM | POA: Diagnosis not present

## 2016-03-02 DIAGNOSIS — I4821 Permanent atrial fibrillation: Secondary | ICD-10-CM

## 2016-03-02 LAB — CUP PACEART INCLINIC DEVICE CHECK
Date Time Interrogation Session: 20170629135234
Implantable Lead Implant Date: 20130913
Implantable Lead Implant Date: 20130913
Implantable Lead Implant Date: 20150303
Implantable Lead Location: 753860
Implantable Lead Model: 5076
MDC IDC LEAD LOCATION: 753858
MDC IDC LEAD LOCATION: 753859
MDC IDC PG MODEL: 3242
Pulse Gen Serial Number: 7548835

## 2016-03-02 NOTE — Patient Instructions (Signed)
Medication Instructions:   Your physician recommends that you continue on your current medications as directed. Please refer to the Current Medication list given to you today.   If you need a refill on your cardiac medications before your next appointment, please call your pharmacy.  Labwork: NONE ORDER TODAY    Testing/Procedures: NONE ORDER TODAY    Follow-Up:  Remote monitoring is used to monitor your Pacemaker of ICD from home. This monitoring reduces the number of office visits required to check your device to one time per year. It allows [name] to keep an eye on the functioning of your device to ensure it is working properly. You are scheduled for a device check from home on .06/01/2016..You may send your transmission at any time that day. If you have a wireless device, the transmission will be sent automatically. After your physician reviews your transmission, you will receive a postcard with your next transmission date.  Your physician wants you to follow-up in: [place] will receive a reminder letter in the mail two months in advance. If you don't receive a letter, please call our office to schedule the follow-up appointment.    Any Other Special Instructions Will Be Listed Below (If Applicable).

## 2016-03-02 NOTE — Progress Notes (Signed)
Electrophysiology Office Note Date: 03/02/2016  ID:  Anita Duke, Anita Duke 1941/01/12, MRN [id number]  PCP: Binnie Rail, MD Primary Cardiologist: [place] Electrophysiologist: Allred  CC: Pacemaker follow-up  Anita Duke is a 75 y.o. female seen today for Dr Rayann Heman.  She presents today for routine electrophysiology followup.  Since last being seen in our clinic, the patient reports doing very well.  She denies chest pain, palpitations, dyspnea, PND, orthopnea, nausea, vomiting, dizziness, syncope, edema, weight gain, or early satiety.  Device History: MDT dual chamber PPM implanted 2013 for complete heart block; upgrade to STJ CRTP 2015   Past Medical History  Diagnosis Date  . Mitral regurgitation     a. s/p repair with LAA ligation and MAZE at time of surgery  . Pulmonary HTN ([place])   . CAD (coronary artery disease)     LIMA to the LAD at time of mitral valve repair / LIMA atretic,, February, 2011  . Hemorrhoids, internal   . Diverticulosis of colon   . Colon polyp, hyperplastic   . High cholesterol   . Asthma   . COPD (chronic obstructive pulmonary disease) ([place])   . HTN (hypertension)   . CHF (congestive heart failure) ([place])   . Type II diabetes mellitus ()   . Cataracts, bilateral   . Depression   . Permanent atrial fibrillation (HCC)     a. s/p MAZE b. s/p AVN ablation  . QT prolongation     Tikosyn and Effexor. QT prolonged October 13, 2011, peak is in dose reduced from 500  to -250 twice a day  . Anemia     Hemoglobin 10.4, December, 2013  . GERD (gastroesophageal reflux disease)   . Arthritis     "not bad; little in my hands; some in my knees" (11/04/2013)  . 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 Mitral valve repair  2001    "anterior and posterior leaflets" (12/04/2012)  . Maze  2001    w/ MVR & CABG  . Cardioversion  02/08/2012    Procedure: CARDIOVERSION;  Surgeon: Carlena Bjornstad, MD;  Location: [facility];   Service: Cardiovascular;  Laterality: N/A;  . Cardioversion  02/29/2012    Procedure: CARDIOVERSION;  Surgeon: Carlena Bjornstad, MD;  Location: [facility];  Service: Cardiovascular;  Laterality: N/A;  . Cardioversion  05/15/2012    Procedure: CARDIOVERSION;  Surgeon: Carlena Bjornstad, MD;  Location: [facility] ENDOSCOPY;  Service: Cardiovascular;  Laterality: N/A;  . Tonsillectomy and adenoidectomy  ~ 1950  . Laparoscopic right hemi colectomy  12/03/2012    Procedure: LAPAROSCOPIC RIGHT HEMI COLECTOMY;  Surgeon: Stark Klein, MD;  Location: [facility];  Service: General;;  . Umbilical hernia repair N/A 12/03/2012    Procedure: HERNIA REPAIR UMBILICAL ADULT;  Surgeon: Stark Klein, MD;  Location: [facility];  Service: General;  Laterality: N/A;  . Diagnostic laparoscopic liver biopsy Left 12/03/2012    Procedure: DIAGNOSTIC LAPAROSCOPIC LIVER BIOPSY;  Surgeon: Stark Klein, MD;  Location: [facility];  Service: General;  Laterality: Left;  . Coronary artery bypass graft  2001    CABG X1 "at time of mitral valve repair" (12/04/2012  . Breast lumpectomy Bilateral 1990's  . Cholecystectomy  1995  . Abdominal hysterectomy  ?2002  . Permanent pacemaker insertion N/A 05/17/2012    MDT Adapta L implanted by Dr Rayann Heman for tachy/brady syndrome  . Av node ablation N/A 05/28/2012    Procedure: AV  NODE ABLATION;  Surgeon: Thompson Grayer, MD;  Location: [facility] CATH LAB;  Service: Cardiovascular;  Laterality: N/A;  . Bi-ventricular pacemaker upgrade N/A 11/04/2013    upgrade of previously implanted dual chamber pacemaker to STJ Quadra Allure CRTP by Dr Rayann Heman  . Cataract extraction, bilateral  2017    Current Outpatient Prescriptions  Medication Sig Dispense Refill  . atorvastatin (LIPITOR) 40 MG tablet Take 1 tablet by mouth  daily 90 tablet 1  . blood glucose meter kit and supplies KIT Dispense based on patient and insurance preference. Check sugars daily. NIDDM 1 each 0  . calcium carbonate (OS-CAL) 600 MG TABS Take 600 mg by mouth daily.     .  carvedilol (COREG) 6.25 MG tablet Take 1 tablet by mouth  twice a day with meals 180 tablet 3  . cholecalciferol (VITAMIN D) 1000 UNITS tablet Take 6,000 Units by mouth daily.     . diphenhydramine-acetaminophen (TYLENOL PM) 25-500 MG TABS tablet Take 1 tablet by mouth at bedtime as needed. As directed    . fish oil-omega-3 fatty acids 1000 MG capsule Take 1 g by mouth 2 (two) times daily.     . furosemide (LASIX) 40 MG tablet Take 0.5 tablets (20 mg total) by mouth daily. 90 tablet 1  . loperamide (IMODIUM) 2 MG capsule Take 1 capsule (2 mg total) by mouth daily as needed for diarrhea or loose stools. 30 capsule 10  . loratadine (CLARITIN) 10 MG tablet Take 10 mg by mouth daily.    . metFORMIN (GLUCOPHAGE XR) 500 MG 24 hr tablet Take 2 tablets (1,000 mg total) by mouth daily with breakfast. 180 tablet 3  . potassium chloride SA (K-DUR,KLOR-CON) 20 MEQ tablet Take 3 tablets by mouth  daily 270 tablet 2  . trandolapril (MAVIK) 4 MG tablet Take 1 tablet by mouth two  times daily for blood  pressure 180 tablet 3  . venlafaxine XR (EFFEXOR-XR) 37.5 MG 24 hr capsule Take 1 capsule by mouth  every evening 90 capsule 1  . warfarin (COUMADIN) 2.5 MG tablet Take as directed by Anticoagulation Clinic 100 tablet 1   No current facility-administered medications for this visit.    Allergies:   Demerol; Morphine; Januvia; and Tetanus toxoid   Social History: Social History   Social History  . Marital Status: Married    Spouse Name: N/A  . Number of Children: 0  . 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    [employer]     Social History Main Topics  . Smoking status: Never Smoker   . Smokeless tobacco: Never Used  . Alcohol Use: No  . Drug Use: No  . Sexual Activity: No   Other Topics Concern  . Not on file   Social History Narrative   Does not et regular exercise   Daily caffeine use          Family History: Family History  Problem Relation Age  of Onset  . Pancreatic cancer Maternal Grandfather   . Heart disease Father   . Diabetes Father   . Heart disease Sister     x 2  . Diabetes Mother   . Kidney disease Mother   . Colon cancer Neg Hx   . Colon polyps Neg Hx   . Diabetes Paternal Grandfather   . Diabetes Paternal Grandmother   . Heart disease Paternal Uncle     x 6  .  Prostate cancer Paternal Uncle      Review of Systems: All other systems reviewed and are otherwise negative except as noted above.   Physical Exam: VS:  BP 138/74 mmHg  Pulse 72  Ht _0  (1.6 m)  Wt 173 lb 3.2 oz (78.563 kg)  BMI 30.69 kg/m2  SpO2 96% , BMI Body mass index is 30.69 kg/(m^2).  GEN- The patient is elderly appearing, alert and oriented x 3 today.   HEENT: normocephalic, atraumatic; sclera clear, conjunctiva pink; hearing intact; oropharynx clear; neck supple  Lungs- Clear to ausculation bilaterally, normal work of breathing.  No wheezes, rales, rhonchi Heart- Regular rate and rhythm (paced) GI- soft, non-tender, non-distended, bowel sounds present  Extremities- no clubbing, cyanosis, or edema  MS- no significant deformity or atrophy Skin- warm and dry, no rash or lesion; PPM pocket well healed Psych- euthymic mood, full affect Neuro- strength and sensation are intact  PPM Interrogation- reviewed in detail today,  See PACEART report  EKG:  EKG is not ordered today.  Recent Labs: 06/11/2015: Magnesium 1.7; TSH 0.581 01/05/2016: ALT 17; BUN 10; Creatinine, Ser 0.60; Hemoglobin 14.6; Platelets 201.0; Potassium 4.3; Sodium 142   Wt Readings from Last 3 Encounters:  03/02/16 173 lb 3.2 oz (78.563 kg)  01/19/16 174 lb 4 oz (79.039 kg)  01/05/16 175 lb (79.379 kg)     Other studies Reviewed: Additional studies/ records that were reviewed today include: Dr Rayann Heman and Dr Hassell Done office notes  Assessment and Plan:  1.  Chronic systolic heart failure Euvolemic on exam Continue current therapy Followed in ICM clinic  2.   Permanent AF s/p AVN ablation Continue Warfarin for CHADS2VASC of 7 No bleeding issues Recent CBC stable  3.  Complete heart block Normal PPM function See Pace Art report No changes today  4.  CAD No recent ischemic symptoms Followed by Dr Irish Lack    Current medicines are reviewed at length with the patient today.   The patient does not have concerns regarding her medicines.  The following changes were made today:  none  Labs/ tests ordered today include: none  No orders of the defined types were placed in this encounter.     Disposition:   Follow up with Delilah Shan, ICM clinic, Dr Rayann Heman 1 year     Signed, Chanetta Marshall, NP 03/02/2016 1:50 PM  [place] [place] [address] [place] [address] [phone] (office) [phone] (fax)

## 2016-03-10 ENCOUNTER — Other Ambulatory Visit: Payer: Self-pay

## 2016-03-10 MED ORDER — FUROSEMIDE 40 MG PO TABS: 20.0000 mg | ORAL_TABLET | Freq: Every day | ORAL | Status: DC

## 2016-03-24 ENCOUNTER — Ambulatory Visit (INDEPENDENT_AMBULATORY_CARE_PROVIDER_SITE_OTHER): Payer: Medicare Other | Admitting: General Practice

## 2016-03-24 DIAGNOSIS — Z5181 Encounter for therapeutic drug level monitoring: Secondary | ICD-10-CM

## 2016-03-24 DIAGNOSIS — I4891 Unspecified atrial fibrillation: Secondary | ICD-10-CM | POA: Diagnosis not present

## 2016-03-24 LAB — POCT INR: INR: 2

## 2016-03-24 NOTE — Progress Notes (Signed)
I have reviewed and agree with the plan. 

## 2016-03-24 NOTE — Progress Notes (Signed)
Pre visit review using our clinic review tool, if applicable. No additional management support is needed unless otherwise documented below in the visit note. 

## 2016-03-29 ENCOUNTER — Ambulatory Visit: Payer: Medicare Other | Admitting: Podiatry

## 2016-04-05 NOTE — Progress Notes (Signed)
Subjective:    Patient ID: Anita Duke, female    DOB: 1941/04/14, 75 y.o.   MRN: [id number]  HPI Here for medicare wellness exam/physical exam.   I have personally reviewed and have noted 1.The patient's medical and social history 2.Their use of alcohol, tobacco or illicit drugs 3.Their current medications and supplements 4.The patient's functional ability including ADL's, fall risks, home safety risks and                 hearing or visual impairment. 5.Diet and physical activities 6.Evidence for depression or mood disorders 7.Care team reviewed and updated - Cardiology - Dr Irish Lack and Dr Rayann Heman   Are there smokers in your home (other than you)? No  Risk Factors Exercise:   walks dog Dietary issues discussed: has not done well with diet recently - husband was in rehab, starting to do better  Cardiac risk factors: advanced age, hypertension, hyperlipidemia, and obesity (BMI >= 30 kg/m2).  Depression Screen  Have you felt down, depressed or hopeless? Yes, feels depressed at times - on medication.  Her husband has cancer.   Feels her depression is fairly controlled.   Have you felt little interest or pleasure in doing things?  No  Activities of Daily Living In your present state of health, do you have any difficulty performing the following activities?:  Driving? No Managing money?  No Feeding yourself? No Getting from bed to chair? No Climbing a flight of stairs? No Preparing food and eating?: No Bathing or showering? No Getting dressed: No Getting to/using the toilet? No Moving around from place to place: No In the past year have you fallen or had a near fall?: Yes, tripped- says she's a Anita Duke    Are you sexually active?  No  Do you have more than one partner?  N/A  Hearing Difficulties:  Do you often ask people to speak up or repeat themselves? Yes - wears hearing aids Do you experience ringing  or noises in your ears? No Do you have difficulty understanding soft or whispered voices? yes Vision:              Any change in vision: No              Up to date with eye exam:   Up to date  Memory:  Do you feel that you have a problem with memory? No, just with recall of names and               words  Do you often misplace items? No  Do you feel safe at home?  Yes  Cognitive Testing  Alert, Orientated? Yes  Normal Appearance? Yes  Recall of three objects?  Yes  Can perform simple calculations? Yes  Displays appropriate judgment? Yes  Can read the correct time from a watch face? Yes   Advanced Directives have been discussed with the patient? Yes   Medications and allergies reviewed with patient and updated if appropriate.  Patient Active Problem List   Diagnosis Date Noted  . Osteopenia 01/10/2016  . Encounter for therapeutic drug monitoring 10/08/2015  . Heat intolerance 10/08/2015  . Obesity 12/02/2014  . CAD (coronary artery disease) 05/28/2014  . Vitamin D deficiency 02/20/2014  . Long term current use of anticoagulant therapy 11/24/2013  . Chronic systolic dysfunction of left ventricle 05/21/2013  . 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 Anemia   . Acquired complete AV block   .  Sick sinus syndrome ([place])   . Pacemaker-Medtronic 05/20/2012  . Allergic rhinitis 04/25/2012  . Mild intermittent asthma 02/16/2011  . Ejection fraction < 50%   . Atrial fibrillation ([place])   . Mitral regurgitation   . Right ventricular dysfunction   . Mitral stenosis   . S/P mitral valve repair   . Hx of CABG   . T2_NIDDM 03/02/2009  . Hyperlipidemia 07/07/2008  . Essential hypertension 07/07/2008  . Diverticulosis of large intestine 08/04/2002    Current Outpatient Prescriptions on File Prior to Visit  Medication Sig Dispense Refill  . atorvastatin (LIPITOR) 40 MG tablet Take 1 tablet by mouth  daily 90 tablet 1  . blood glucose meter kit and supplies KIT  Dispense based on patient and insurance preference. Check sugars daily. NIDDM 1 each 0  . calcium carbonate (OS-CAL) 600 MG TABS Take 600 mg by mouth daily.     . carvedilol (COREG) 6.25 MG tablet Take 1 tablet by mouth  twice a day with meals 180 tablet 3  . cholecalciferol (VITAMIN D) 1000 UNITS tablet Take 6,000 Units by mouth daily.     . diphenhydramine-acetaminophen (TYLENOL PM) 25-500 MG TABS tablet Take 1 tablet by mouth at bedtime as needed. As directed    . fish oil-omega-3 fatty acids 1000 MG capsule Take 1 g by mouth 2 (two) times daily.     . furosemide (LASIX) 40 MG tablet Take 0.5 tablets (20 mg total) by mouth daily. 90 tablet 0  . loperamide (IMODIUM) 2 MG capsule Take 1 capsule (2 mg total) by mouth daily as needed for diarrhea or loose stools. 30 capsule 10  . loratadine (CLARITIN) 10 MG tablet Take 10 mg by mouth daily.    . metFORMIN (GLUCOPHAGE XR) 500 MG 24 hr tablet Take 2 tablets (1,000 mg total) by mouth daily with breakfast. 180 tablet 3  . potassium chloride SA (K-DUR,KLOR-CON) 20 MEQ tablet Take 3 tablets by mouth  daily 270 tablet 2  . trandolapril (MAVIK) 4 MG tablet Take 1 tablet by mouth two  times daily for blood  pressure 180 tablet 3  . venlafaxine XR (EFFEXOR-XR) 37.5 MG 24 hr capsule Take 1 capsule by mouth  every evening 90 capsule 1  . warfarin (COUMADIN) 2.5 MG tablet Take as directed by Anticoagulation Clinic 100 tablet 1   No current facility-administered medications on file prior to visit.     Past Medical History:  Diagnosis Date  . Anemia    Hemoglobin 10.4, December, 2013  . Arthritis    "not bad; little in my hands; some in my knees" (11/04/2013)  . Asthma   . CAD (coronary artery disease)    LIMA to the LAD at time of mitral valve repair / LIMA atretic,, February, 2011  . Cataracts, bilateral   . 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 CHF (congestive heart failure) ([place])   . Colon polyp, hyperplastic   . COPD (chronic obstructive pulmonary disease)  ([place])   . Depression   . Diverticulosis of colon   . GERD (gastroesophageal reflux disease)   . Hemorrhoids, internal   . High cholesterol   . HTN (hypertension)   . Mitral regurgitation    a. s/p repair with LAA ligation and MAZE at time of surgery  . Permanent atrial fibrillation (HCC)    a. s/p MAZE b. s/p AVN ablation  . Pulmonary HTN ([place])   . QT prolongation    Tikosyn and Effexor. QT prolonged October 13, 2011, peak  is in dose reduced from 500  to -250 twice a day  . Type II diabetes mellitus ([place])     Past Surgical History:  Procedure Laterality Date  . ABDOMINAL HYSTERECTOMY  ?2002  . AV NODE ABLATION N/A 05/28/2012   Procedure: AV NODE ABLATION;  Surgeon: Thompson Grayer, MD;  [facility] C[facility] LAB;  Service: Cardiovascular;  Laterality: N/A;  . BI-VENTRICULAR PACEMAKER UPGRADE N/A 11/04/2013   upgrade of previously implanted dual chamber pacemaker to STJ Quadra Allure CRTP by Dr Rayann Heman  . BREAST LUMPECTOMY Bilateral 1990's  . CARDIOVERSION  02/08/2012   Procedure: CARDIOVERSION;  Surgeon: Carlena Bjornstad, MD;  Location: [facility];  Service: Cardiovascular;  Laterality: N/A;  . CARDIOVERSION  02/29/2012   Procedure: CARDIOVERSION;  Surgeon: Carlena Bjornstad, MD;  Location: [place];  Service: Cardiovascular;  Laterality: N/A;  . CARDIOVERSION  05/15/2012   Procedure: CARDIOVERSION;  Surgeon: Carlena Bjornstad, MD;  Location: [facility];  Service: Cardiovascular;  Laterality: N/A;  . CATARACT EXTRACTION, BILATERAL  2017  . CHOLECYSTECTOMY  1995  . CORONARY ARTERY BYPASS GRAFT  2001   CABG X1 "at time of mitral valve repair" (12/04/2012  . DIAGNOSTIC LAPAROSCOPIC LIVER BIOPSY Left 12/03/2012   Procedure: DIAGNOSTIC LAPAROSCOPIC LIVER BIOPSY;  Surgeon: Stark Klein, MD;  Location: [facility];  Service: General;  Laterality: Left;  . LAPAROSCOPIC RIGHT HEMI COLECTOMY  12/03/2012   Procedure: LAPAROSCOPIC RIGHT HEMI COLECTOMY;  Surgeon: Stark Klein, MD;  Location: [facility];  Service: General;;  . MAZE   2001   w/ MVR & CABG  . MITRAL VALVE REPAIR  2001   "anterior and posterior leaflets" (12/04/2012)  . 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 TONSILLECTOMY AND ADENOIDECTOMY  ~ 1950  . UMBILICAL HERNIA REPAIR N/A 12/03/2012   Procedure: HERNIA REPAIR UMBILICAL ADULT;  Surgeon: Stark Klein, MD;  Location: [facility];  Service: General;  Laterality: N/A;    Social History   Social History  . Marital status: Married    Spouse name: N/A  . Number of children: 0  . 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  [place] History Main Topics  . Smoking status: Never Smoker  . Smokeless tobacco: Never Used  . Alcohol use No  . Drug use: No  . Sexual activity: No   Other Topics Concern  . None   Social History Narrative   Does not et regular exercise   Daily caffeine use          Family History  Problem Relation Age of Onset  . Pancreatic cancer Maternal Grandfather   . Heart disease Father   . Diabetes Father   . Heart disease Sister     x 2  . Diabetes Mother   . Kidney disease Mother   . Colon cancer Neg Hx   . Colon polyps Neg Hx   . Diabetes Paternal Grandfather   . Diabetes Paternal Grandmother   . Heart disease Paternal Uncle     x 6  . Prostate cancer Paternal Uncle     Review of Systems  Constitutional: Negative for chills and fever.  HENT: Positive for hearing loss. Negative for tinnitus.   Eyes: Negative for visual disturbance.  Respiratory: Negative for cough, shortness of breath and wheezing.   Cardiovascular: Negative  for chest pain, palpitations and leg swelling.  Gastrointestinal: Positive for diarrhea. Negative for abdominal pain, blood in stool, constipation and nausea.       No gerd  Genitourinary: Negative for dysuria and hematuria.  Skin: Negative for color change and rash.  Neurological: Positive  for headaches (tension). Negative for light-headedness.  Psychiatric/Behavioral: Positive for dysphoric mood (controlled). The patient is not nervous/anxious.        Objective:   Vitals:   04/06/16 0807  BP: (!) 142/76  Pulse: 71  Temp: 98 F (36.7 C)   Filed Weights   04/06/16 0807  Weight: 169 lb 12 oz (77 kg)   Body mass index is 30.07 kg/m.   Physical Exam Constitutional: She appears well-developed and well-nourished. No distress.  HENT:  Head: Normocephalic and atraumatic.  Right Ear: External ear normal. Normal ear canal and TM Left Ear: External ear normal.  Normal ear canal and TM Mouth/Throat: Oropharynx is clear and moist.  Eyes: Conjunctivae and EOM are normal.  Neck: Neck supple. No tracheal deviation present. No thyromegaly present.  No carotid bruit  Cardiovascular: Normal rate, regular rhythm and normal heart sounds.   No murmur heard.  No edema. Pulmonary/Chest: Effort normal and breath sounds normal. No respiratory distress. She has no wheezes. She has no rales.  Breast: deferred  Abdominal: Soft. She exhibits no distension. There is no tenderness.  Lymphadenopathy: She has no cervical adenopathy.  Skin: Skin is warm and dry. She is not diaphoretic.  Psychiatric: She has a normal mood and affect. Her behavior is normal.       Assessment & Plan:   Wellness Exam: Immunizations  Up to date  Colonoscopy  Up to date  Mammogram  Up to date  Dexa  Up to date  Eye exam  Up to date  Hearing loss - mild hearing loss, wears hearing aids Memory concerns/difficulties - none, except for recall which is normal for her age 45 of ADLs - fully Stressed the importance of regular exercise   Patient received copy of preventative screening tests/immunizations recommended for the next 5-10 years.  Physical exam: Screening blood work ordered Immunizations  Up to date  Colonoscopy  Up to date  Mammogram   Up to date  Dexa  Done this year Eye exams   Up  to date  Exercise - walking dog - stressed trying to increase exercise Weight - advised to work on weight loss Skin - no concerns, has seen derm Substance abuse - none  See Problem List for Assessment and Plan of chronic medical problems.

## 2016-04-06 ENCOUNTER — Ambulatory Visit (INDEPENDENT_AMBULATORY_CARE_PROVIDER_SITE_OTHER): Payer: Medicare Other | Admitting: Internal Medicine

## 2016-04-06 ENCOUNTER — Encounter: Payer: Self-pay | Admitting: Internal Medicine

## 2016-04-06 VITALS — BP 142/76 | HR 71 | Temp 98.0°F | Ht 63.0 in | Wt 169.8 lb

## 2016-04-06 DIAGNOSIS — Z0001 Encounter for general adult medical examination with abnormal findings: Secondary | ICD-10-CM

## 2016-04-06 DIAGNOSIS — Z0000 Encounter for general adult medical examination without abnormal findings: Secondary | ICD-10-CM

## 2016-04-06 DIAGNOSIS — I2510 Atherosclerotic heart disease of native coronary artery without angina pectoris: Secondary | ICD-10-CM

## 2016-04-06 DIAGNOSIS — I10 Essential (primary) hypertension: Secondary | ICD-10-CM

## 2016-04-06 DIAGNOSIS — E782 Mixed hyperlipidemia: Secondary | ICD-10-CM | POA: Diagnosis not present

## 2016-04-06 DIAGNOSIS — E119 Type 2 diabetes mellitus without complications: Secondary | ICD-10-CM | POA: Diagnosis not present

## 2016-04-06 DIAGNOSIS — I4891 Unspecified atrial fibrillation: Secondary | ICD-10-CM

## 2016-04-06 MED ORDER — EMPAGLIFLOZIN 10 MG PO TABS: 10.0000 mg | ORAL_TABLET | Freq: Every day | ORAL | 5 refills | Status: DC

## 2016-04-06 MED FILL — JARDIANCE 10 MG TABLET: 10 | 30 days supply | Qty: 30 | Fill #0

## 2016-04-06 NOTE — Patient Instructions (Addendum)
  Anita Duke , Thank you for taking time to come for your Medicare Wellness Visit. I appreciate your ongoing commitment to your health goals. Please review the following plan we discussed and let me know if I can assist you in the future.   These are the goals we discussed: Goals    Keep walking, work on weight loss      This is a list of the screening recommended for you and due dates:  Health Maintenance  Topic Date Due  . Flu Shot  04/04/2016  . Tetanus Vaccine  07/26/2023*  . Hemoglobin A1C  07/07/2016  . Eye exam for diabetics  08/22/2016  . Complete foot exam   12/29/2016  . Colon Cancer Screening  01/06/2017  . DEXA scan (bone density measurement)  01/05/2019  . Shingles Vaccine  Completed  . Pneumonia vaccines  Completed  *Topic was postponed. The date shown is not the original due date.     All other Health Maintenance issues reviewed.   All recommended immunizations and age-appropriate screenings are up-to-date or discussed.  No immunizations administered today.   Medications reviewed and updated.  Changes include stopping metformin and starting jardiance 10 mg daily.  Your prescription(s) have been submitted to your pharmacy. Please take as directed and contact our office if you believe you are having problem(s) with the medication(s).  Please followup in 3 months

## 2016-04-06 NOTE — Assessment & Plan Note (Signed)
Asymptomatic On a statin, BB, warfarin Increase exercise, work on weight loss

## 2016-04-06 NOTE — Assessment & Plan Note (Signed)
Not controlled Not tolerating metformin and not taking it regularly - will d/c Start jardiance 10 mg daily Monitor for hypoglycemia Increase exercise, work on weight loss Recheck a1c in 3 months

## 2016-04-06 NOTE — Assessment & Plan Note (Signed)
Rate controlled, in sinus rhythm Following with cardio On warfarin

## 2016-04-06 NOTE — Assessment & Plan Note (Signed)
BP Readings from Last 3 Encounters:  04/06/16 (!) 142/76  03/02/16 138/74  01/19/16 130/70   Overall controlled Continue current medications

## 2016-04-06 NOTE — Progress Notes (Signed)
Pre visit review using our clinic review tool, if applicable. No additional management support is needed unless otherwise documented below in the visit note. 

## 2016-04-06 NOTE — Assessment & Plan Note (Signed)
Lipid well controlled Continue lipitor 40 mg daily

## 2016-04-11 ENCOUNTER — Ambulatory Visit (INDEPENDENT_AMBULATORY_CARE_PROVIDER_SITE_OTHER): Payer: Medicare Other | Admitting: *Deleted

## 2016-04-11 DIAGNOSIS — Z950 Presence of cardiac pacemaker: Secondary | ICD-10-CM | POA: Diagnosis not present

## 2016-04-11 DIAGNOSIS — I5022 Chronic systolic (congestive) heart failure: Secondary | ICD-10-CM | POA: Diagnosis not present

## 2016-04-11 DIAGNOSIS — I495 Sick sinus syndrome: Secondary | ICD-10-CM

## 2016-04-11 NOTE — Progress Notes (Signed)
Remote pacemaker transmission.   

## 2016-04-12 ENCOUNTER — Encounter: Payer: Self-pay | Admitting: Cardiology

## 2016-04-12 NOTE — Progress Notes (Signed)
EPIC Encounter for ICM Monitoring  Patient Name: Anita Duke is a 75 y.o. female Date: 04/12/2016 Primary Care Physican: Binnie Rail, MD Primary Cardiologist: Irish Lack Electrophysiologist: Allred Dry Weight: unknown Bi-V Pacing:  98%       Heart Failure questions reviewed, pt symptomatic with swollen ankles for the last few days  Thoracic impedance abnormal suggesting fluid accumulation 04/07/2016 to 04/11/2016.  LABS: 01/05/2016 Creatinine 0.60, BUN 10, Potassium 4.3, Sodium 142 11/30/2015 Creatinine 0.66, BUN 14, Potassium 3.8, Sodium 140 06/11/2015 Creatinine 0.49, BUN 12, Potassium 4.1, Sodium 141  Recommendations:  Recommended to increase Furosemide to 40 mg x 3 days and after 3rd day return to prescribed dosage of 1/2 (20 mg) tablet daily. Increase Potassium 20 meq to 4 tablets x 3 days and return to prescribed dosage of 3 tablets per day.   ICM trend: 04/10/2016     Follow-up plan: ICM clinic phone appointment on 04/20/2016.  Copy of ICM check sent to primary cardiologist and device physician.   [name], RN 04/12/2016 9:36 AM

## 2016-04-12 NOTE — Progress Notes (Signed)
I agree with recs from the device check.

## 2016-04-18 LAB — CUP PACEART REMOTE DEVICE CHECK
Battery Remaining Percentage: 95.5 %
Battery Voltage: 2.98 V
Implantable Lead Implant Date: 20130913
Implantable Lead Implant Date: 20130913
Implantable Lead Implant Date: 20150303
Implantable Lead Location: 753858
Implantable Lead Location: 753860
Implantable Lead Model: 5076
Lead Channel Impedance Value: 440 Ohm
Lead Channel Impedance Value: 490 Ohm
Lead Channel Pacing Threshold Amplitude: 1.5 V
Lead Channel Setting Pacing Pulse Width: 0.4 ms
MDC IDC LEAD LOCATION: 753859
MDC IDC MSMT BATTERY REMAINING LONGEVITY: 92 mo
MDC IDC MSMT LEADCHNL LV PACING THRESHOLD PULSEWIDTH: 0.6 ms
MDC IDC MSMT LEADCHNL RV PACING THRESHOLD AMPLITUDE: 0.625 V
MDC IDC MSMT LEADCHNL RV PACING THRESHOLD PULSEWIDTH: 0.4 ms
MDC IDC MSMT LEADCHNL RV SENSING INTR AMPL: 8.6 mV
MDC IDC PG SERIAL: 7548835
MDC IDC SESS DTM: 20170808060013
MDC IDC SET LEADCHNL LV PACING AMPLITUDE: 2.5 V
MDC IDC SET LEADCHNL LV PACING PULSEWIDTH: 0.6 ms
MDC IDC SET LEADCHNL RV PACING AMPLITUDE: 2 V
MDC IDC SET LEADCHNL RV SENSING SENSITIVITY: 3 mV
Pulse Gen Model: 3242

## 2016-04-20 ENCOUNTER — Ambulatory Visit (INDEPENDENT_AMBULATORY_CARE_PROVIDER_SITE_OTHER): Payer: Medicare Other

## 2016-04-20 ENCOUNTER — Telehealth: Payer: Self-pay

## 2016-04-20 ENCOUNTER — Telehealth: Payer: Self-pay | Admitting: Cardiology

## 2016-04-20 DIAGNOSIS — I5022 Chronic systolic (congestive) heart failure: Secondary | ICD-10-CM

## 2016-04-20 DIAGNOSIS — Z950 Presence of cardiac pacemaker: Secondary | ICD-10-CM

## 2016-04-20 NOTE — Telephone Encounter (Signed)
Spoke with pt and reminded pt of remote transmission that is due today. Pt verbalized understanding.   

## 2016-04-20 NOTE — Telephone Encounter (Signed)
Remote ICM transmission received.  Attempted patient call and left detailed message and to return call.   

## 2016-04-20 NOTE — Progress Notes (Signed)
EPIC Encounter for ICM Monitoring  Patient Name: Anita Duke is a 75 y.o. female Date: 04/20/2016 Primary Care Physican: Binnie Rail, MD Primary Cardiologist: Irish Lack Electrophysiologist: Allred Dry Weight: unknown Bi-V Pacing:  98%       Attempted patient call and unable to reach.  Transmission reviewed.   Since 04/11/2016 transmission and after increase of Furosemide 40 mg x 3 days, thoracic impedance returned to normal.  Patient adjusts Furosemide as needed based on symptoms  Follow-up plan: ICM clinic phone appointment on 06/01/2016.  Copy of ICM check sent to device physician.   ICM trend: 04/20/2016       [name], RN 04/20/2016 1:46 PM

## 2016-04-25 ENCOUNTER — Ambulatory Visit: Payer: Medicare Other

## 2016-04-25 ENCOUNTER — Ambulatory Visit (INDEPENDENT_AMBULATORY_CARE_PROVIDER_SITE_OTHER): Payer: Medicare Other | Admitting: General Practice

## 2016-04-25 DIAGNOSIS — I4891 Unspecified atrial fibrillation: Secondary | ICD-10-CM | POA: Diagnosis not present

## 2016-04-25 DIAGNOSIS — Z5181 Encounter for therapeutic drug level monitoring: Secondary | ICD-10-CM | POA: Diagnosis not present

## 2016-04-25 LAB — POCT INR: INR: 2.2

## 2016-04-25 NOTE — Progress Notes (Signed)
I have reviewed and agree with the plan. 

## 2016-04-25 NOTE — Progress Notes (Signed)
   Binnie Rail, MD

## 2016-04-28 ENCOUNTER — Ambulatory Visit: Payer: Medicare Other

## 2016-04-28 ENCOUNTER — Encounter: Payer: Self-pay | Admitting: Cardiology

## 2016-04-30 ENCOUNTER — Other Ambulatory Visit: Payer: Self-pay | Admitting: Internal Medicine

## 2016-05-24 ENCOUNTER — Telehealth: Payer: Self-pay | Admitting: *Deleted

## 2016-05-24 MED ORDER — ACCU-CHEK SOFTCLIX LANCETS MISC: 1.0000 | Freq: Two times a day (BID) | 3 refills | Status: DC

## 2016-05-24 MED ORDER — ACCU-CHEK AVIVA PLUS W/DEVICE KIT: PACK | 0 refills | Status: DC

## 2016-05-24 MED ORDER — GLUCOSE BLOOD VI STRP: 1.0000 | ORAL_STRIP | Freq: Two times a day (BID) | 3 refills | Status: DC

## 2016-05-24 NOTE — Telephone Encounter (Signed)
Left msg on triage stating [facility] has sent MD request for rx for Accu-chek monitor w/supplies, and they have not received back. Needing script sent to them...Anita Duke

## 2016-05-24 NOTE — Telephone Encounter (Signed)
Called pt spoke w/husband inform rx's has been sent to [facility]...Johny Chess

## 2016-06-01 ENCOUNTER — Telehealth: Payer: Self-pay

## 2016-06-01 ENCOUNTER — Encounter: Payer: Medicare Other | Admitting: *Deleted

## 2016-06-01 ENCOUNTER — Telehealth: Payer: Self-pay | Admitting: Cardiology

## 2016-06-01 NOTE — Telephone Encounter (Signed)
Attempted call to patient.  Left voice mail message to send remote transmission.

## 2016-06-01 NOTE — Telephone Encounter (Signed)
LMOVM reminding pt to send remote transmission.   

## 2016-06-02 ENCOUNTER — Encounter: Payer: Self-pay | Admitting: Cardiology

## 2016-06-07 ENCOUNTER — Ambulatory Visit: Payer: Medicare Other

## 2016-06-09 NOTE — Progress Notes (Signed)
No ICM remote transmission received for 05/31/2016 and next ICM transmission scheduled for 07/11/2016.

## 2016-06-14 ENCOUNTER — Ambulatory Visit (INDEPENDENT_AMBULATORY_CARE_PROVIDER_SITE_OTHER): Payer: Medicare Other | Admitting: General Practice

## 2016-06-14 DIAGNOSIS — Z5181 Encounter for therapeutic drug level monitoring: Secondary | ICD-10-CM

## 2016-06-14 DIAGNOSIS — I4891 Unspecified atrial fibrillation: Secondary | ICD-10-CM

## 2016-06-14 LAB — POCT INR: INR: 2.7

## 2016-06-14 NOTE — Progress Notes (Signed)
I have reviewed and agree with the plan. 

## 2016-06-25 ENCOUNTER — Other Ambulatory Visit: Payer: Self-pay | Admitting: Internal Medicine

## 2016-06-25 ENCOUNTER — Other Ambulatory Visit: Payer: Self-pay | Admitting: Interventional Cardiology

## 2016-06-26 ENCOUNTER — Other Ambulatory Visit: Payer: Self-pay | Admitting: General Practice

## 2016-06-26 ENCOUNTER — Other Ambulatory Visit: Payer: Self-pay | Admitting: *Deleted

## 2016-06-26 MED ORDER — WARFARIN SODIUM 2.5 MG PO TABS: ORAL_TABLET | ORAL | 1 refills | Status: DC

## 2016-06-26 MED ORDER — FUROSEMIDE 40 MG PO TABS: 20.0000 mg | ORAL_TABLET | Freq: Every day | ORAL | 1 refills | Status: DC

## 2016-06-26 MED ORDER — ATORVASTATIN CALCIUM 40 MG PO TABS: 40.0000 mg | ORAL_TABLET | Freq: Every day | ORAL | 3 refills | Status: DC

## 2016-07-04 ENCOUNTER — Ambulatory Visit (INDEPENDENT_AMBULATORY_CARE_PROVIDER_SITE_OTHER): Payer: Medicare Other | Admitting: General Practice

## 2016-07-04 DIAGNOSIS — Z5181 Encounter for therapeutic drug level monitoring: Secondary | ICD-10-CM

## 2016-07-04 LAB — POCT INR: INR: 2

## 2016-07-04 NOTE — Patient Instructions (Signed)
Pre visit review using our clinic review tool, if applicable. No additional management support is needed unless otherwise documented below in the visit note. 

## 2016-07-07 ENCOUNTER — Ambulatory Visit (INDEPENDENT_AMBULATORY_CARE_PROVIDER_SITE_OTHER): Payer: Medicare Other | Admitting: Internal Medicine

## 2016-07-07 ENCOUNTER — Other Ambulatory Visit (INDEPENDENT_AMBULATORY_CARE_PROVIDER_SITE_OTHER): Payer: Medicare Other

## 2016-07-07 ENCOUNTER — Encounter: Payer: Self-pay | Admitting: Internal Medicine

## 2016-07-07 VITALS — BP 138/78 | HR 77 | Temp 98.0°F | Resp 20 | Wt 172.5 lb

## 2016-07-07 DIAGNOSIS — E782 Mixed hyperlipidemia: Secondary | ICD-10-CM

## 2016-07-07 DIAGNOSIS — E119 Type 2 diabetes mellitus without complications: Secondary | ICD-10-CM

## 2016-07-07 DIAGNOSIS — I10 Essential (primary) hypertension: Secondary | ICD-10-CM

## 2016-07-07 DIAGNOSIS — R61 Generalized hyperhidrosis: Secondary | ICD-10-CM | POA: Insufficient documentation

## 2016-07-07 LAB — HEMOGLOBIN A1C: HEMOGLOBIN A1C: 8.2 % — AB (ref 4.6–6.5)

## 2016-07-07 LAB — COMPREHENSIVE METABOLIC PANEL
ALK PHOS: 90 U/L (ref 39–117)
ALT: 16 U/L (ref 0–35)
AST: 16 U/L (ref 0–37)
Albumin: 4.3 g/dL (ref 3.5–5.2)
BILIRUBIN TOTAL: 0.9 mg/dL (ref 0.2–1.2)
BUN: 15 mg/dL (ref 6–23)
CO2: 32 mEq/L (ref 19–32)
Calcium: 9.7 mg/dL (ref 8.4–10.5)
Chloride: 104 mEq/L (ref 96–112)
Creatinine, Ser: 0.57 mg/dL (ref 0.40–1.20)
GFR: 109.46 mL/min (ref >60.00)
GLUCOSE: 169 mg/dL — AB (ref 70–99)
Potassium: 3.8 mEq/L (ref 3.5–5.1)
SODIUM: 142 meq/L (ref 135–145)
TOTAL PROTEIN: 6.9 g/dL (ref 6.0–8.3)

## 2016-07-07 LAB — T3, FREE: T3, Free: 3.6 pg/mL (ref 2.3–4.2)

## 2016-07-07 LAB — T4, FREE: Free T4: 0.79 ng/dL (ref 0.60–1.60)

## 2016-07-07 LAB — TSH: TSH: 0.88 u[IU]/mL (ref 0.35–4.50)

## 2016-07-07 NOTE — Assessment & Plan Note (Signed)
Just started the Jardiance three weeks ago - she denies side effects Not compliant with a diabetic diet Walking dog Will check a1c, but it will likely be elevated  - will not change medication - give the medication time to work She will follow up in 6 months, sooner if needed

## 2016-07-07 NOTE — Assessment & Plan Note (Signed)
Lipid panel well controlled Continue lipitor 40 mg daily

## 2016-07-07 NOTE — Progress Notes (Signed)
Pre visit review using our clinic review tool, if applicable. No additional management support is needed unless otherwise documented below in the visit note. 

## 2016-07-07 NOTE — Progress Notes (Signed)
Subjective:    Patient ID: Anita Duke, female    DOB: 1941-08-14, 75 y.o.   MRN: [id number]  HPI The patient is here for follow up.  Her husband has metastatic lung cancer.  She has increased stress, but feels she is dealing with everything ok.    Diabetes: She is taking her medication daily as prescribed, but just started it three weeks ago. She is not compliant with a diabetic diet. She is exercising  - walks dog 4 times a day - only 1/2 a block. She monitors her sugars and they have been running 120's - 166. She checks her feet daily and denies foot lesions. She is up-to-date with an ophthalmology examination.   CAD, Afib, Hypertension: She is taking her medication daily. She is compliant with a low sodium diet.  She denies chest pain, palpitations, edema, shortness of breath and regular headaches. She is exercising regularly.  She does not monitor her blood pressure at home.    Hyperlipidemia: She is taking her medication daily. She is compliant with a low fat/cholesterol diet. She is exercising regularly - walking the dog. She denies myalgias.   Excessive sweating;  She continues to have excessive sweating.  She sweats in her head.  She has sweating 3-4 times a day.  It is very bothersome.  She stopped the effexor for three weeks and did not know a difference. She wonders if it is her thyroid or what else could be causing the problem.   Medications and allergies reviewed with patient and updated if appropriate.  Patient Active Problem List   Diagnosis Date Noted  . Osteopenia 01/10/2016  . Encounter for therapeutic drug monitoring 10/08/2015  . Heat intolerance 10/08/2015  . Obesity 12/02/2014  . CAD (coronary artery disease) 05/28/2014  . Vitamin D deficiency 02/20/2014  . Long term current use of anticoagulant therapy 11/24/2013  . Chronic systolic dysfunction of left ventricle 05/21/2013  . 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 Acquired complete AV  block   . Sick sinus syndrome ([place])   . Pacemaker-Medtronic 05/20/2012  . Allergic rhinitis 04/25/2012  . Mild intermittent asthma 02/16/2011  . Ejection fraction < 50%   . Atrial fibrillation ([place])   . Mitral regurgitation   . Right ventricular dysfunction   . Mitral stenosis   . S/P mitral valve repair   . Hx of CABG   . Diabetes type II 03/02/2009  . Hyperlipidemia 07/07/2008  . Essential hypertension 07/07/2008  . Diverticulosis of large intestine 08/04/2002    Current Outpatient Prescriptions on File Prior to Visit  Medication Sig Dispense Refill  . ACCU-CHEK SOFTCLIX LANCETS lancets 1 each by Other route 2 (two) times daily. Use to check blood sugars twice a day Dx E11.9 100 each 3  . atorvastatin (LIPITOR) 40 MG tablet Take 1 tablet (40 mg total) by mouth daily. 90 tablet 3  . blood glucose meter kit and supplies KIT Dispense based on patient and insurance preference. Check sugars daily. NIDDM 1 each 0  . Blood Glucose Monitoring Suppl (ACCU-CHEK AVIVA PLUS) w/Device KIT Use to check blood sugars daily Dx E11.9 1 kit 0  . calcium carbonate (OS-CAL) 600 MG TABS Take 600 mg by mouth daily.     . carvedilol (COREG) 6.25 MG tablet Take 1 tablet by mouth  twice a day with meals 180 tablet 3  . cholecalciferol (VITAMIN D) 1000 UNITS tablet Take 6,000 Units by mouth daily.     Marland Kitchen  diphenhydramine-acetaminophen (TYLENOL PM) 25-500 MG TABS tablet Take 1 tablet by mouth at bedtime as needed. As directed    . empagliflozin (JARDIANCE) 10 MG TABS tablet Take 10 mg by mouth daily. 30 tablet 5  . fish oil-omega-3 fatty acids 1000 MG capsule Take 1 g by mouth 2 (two) times daily.     . furosemide (LASIX) 40 MG tablet Take 0.5 tablets (20 mg total) by mouth daily. 45 tablet 1  . glucose blood (ACCU-CHEK AVIVA PLUS) test strip 1 each by Other route 2 (two) times daily. Use as instructed 100 each 3  . loperamide (IMODIUM) 2 MG capsule Take 1 capsule (2 mg total) by mouth daily as needed for  diarrhea or loose stools. 30 capsule 10  . loratadine (CLARITIN) 10 MG tablet Take 10 mg by mouth daily.    . potassium chloride SA (K-DUR,KLOR-CON) 20 MEQ tablet Take 3 tablets by mouth  daily 270 tablet 2  . trandolapril (MAVIK) 4 MG tablet Take 1 tablet by mouth two  times daily for blood  pressure 180 tablet 1  . venlafaxine XR (EFFEXOR-XR) 37.5 MG 24 hr capsule Take 1 capsule by mouth  every evening 90 capsule 1  . warfarin (COUMADIN) 2.5 MG tablet Take as directed by Anticoagulation Clinic 100 tablet 1   No current facility-administered medications on file prior to visit.     Past Medical History:  Diagnosis Date  . Anemia    Hemoglobin 10.4, December, 2013  . Arthritis    "not bad; little in my hands; some in my knees" (11/04/2013)  . Asthma   . CAD (coronary artery disease)    LIMA to the LAD at time of mitral valve repair / LIMA atretic,, February, 2011  . Cataracts, bilateral   . Cecal cancer ([place]) 2014  . CHF (congestive heart failure) ([place])   . Colon polyp, hyperplastic   . COPD (chronic obstructive pulmonary disease) ([place])   . Depression   . Diverticulosis of colon   . GERD (gastroesophageal reflux disease)   . Hemorrhoids, internal   . High cholesterol   . HTN (hypertension)   . Mitral regurgitation    a. s/p repair with LAA ligation and MAZE at time of surgery  . Permanent atrial fibrillation ([facility])    a. s/p MAZE b. s/p AVN ablation  . Pulmonary HTN   . QT prolongation    Tikosyn and Effexor. QT prolonged October 13, 2011, peak is in dose reduced from 500  to -250 twice a day  . Type II diabetes mellitus ([place])     Past Surgical History:  Procedure Laterality Date  . ABDOMINAL HYSTERECTOMY  ?2002  . AV NODE ABLATION N/A 05/28/2012   Procedure: AV NODE ABLATION;  Surgeon: Thompson Grayer, MD;  Location: [facility] CATH LAB;  Service: Cardiovascular;  Laterality: N/A;  . BI-VENTRICULAR PACEMAKER UPGRADE N/A 11/04/2013   upgrade of previously implanted dual chamber pacemaker  to STJ Quadra Allure CRTP by Dr Rayann Heman  . BREAST LUMPECTOMY Bilateral 1990's  . CARDIOVERSION  02/08/2012   Procedure: CARDIOVERSION;  Surgeon: Carlena Bjornstad, MD;  Location: [place];  Service: Cardiovascular;  Laterality: N/A;  . CARDIOVERSION  02/29/2012   Procedure: CARDIOVERSION;  Surgeon: Carlena Bjornstad, MD;  Location: [place];  Service: Cardiovascular;  Laterality: N/A;  . CARDIOVERSION  05/15/2012   Procedure: CARDIOVERSION;  Surgeon: Carlena Bjornstad, MD;  Location: [place];  Service: Cardiovascular;  Laterality: N/A;  . CATARACT EXTRACTION, BILATERAL  2017  .  CHOLECYSTECTOMY  1995  . CORONARY ARTERY BYPASS GRAFT  2001   CABG X1 "at time of mitral valve repair" (12/04/2012  . DIAGNOSTIC LAPAROSCOPIC LIVER BIOPSY Left 12/03/2012   Procedure: DIAGNOSTIC LAPAROSCOPIC LIVER BIOPSY;  Surgeon: Stark Klein, MD;  Location: [place];  Service: General;  Laterality: Left;  . LAPAROSCOPIC RIGHT HEMI COLECTOMY  12/03/2012   Procedure: LAPAROSCOPIC RIGHT HEMI COLECTOMY;  Surgeon: Stark Klein, MD;  Location: [place];  Service: General;;  . MAZE  2001   w/ MVR & CABG  . MITRAL VALVE REPAIR  2001   "anterior and posterior leaflets" (12/04/2012)  . 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 TONSILLECTOMY AND ADENOIDECTOMY  ~ 1950  . UMBILICAL HERNIA REPAIR N/A 12/03/2012   Procedure: HERNIA REPAIR UMBILICAL ADULT;  Surgeon: Stark Klein, MD;  Location: [place];  Service: General;  Laterality: N/A;    Social History   Social History  . Marital status: Married    Spouse name: N/A  . Number of children: 0  . 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  [place] History Main Topics  . Smoking status: Never Smoker  . Smokeless tobacco: Never Used  . Alcohol use No  . Drug use: No  . Sexual activity: No   Other Topics Concern  . None   Social History  Narrative   Does not et regular exercise   Daily caffeine use          Family History  Problem Relation Age of Onset  . Heart disease Father   . Diabetes Father   . Heart disease Sister     x 2  . Diabetes Mother   . Kidney disease Mother   . Pancreatic cancer Maternal Grandfather   . Diabetes Paternal Grandfather   . Diabetes Paternal Grandmother   . Heart disease Paternal Uncle     x 6  . Prostate cancer Paternal Uncle   . Colon cancer Neg Hx   . Colon polyps Neg Hx     Review of Systems  Constitutional: Positive for diaphoresis. Negative for chills and fever.  Respiratory: Negative for cough, shortness of breath and wheezing.   Cardiovascular: Negative for chest pain, palpitations and leg swelling.  Gastrointestinal: Positive for diarrhea (chronic). Negative for abdominal pain, blood in stool and constipation.       No gerd  Neurological: Positive for headaches (stress related). Negative for dizziness, light-headedness and numbness.       Objective:   Vitals:   07/07/16 0814  BP: 138/78  Pulse: 77  Resp: 20  Temp: 98 F (36.7 C)   Filed Weights   07/07/16 0814  Weight: 172 lb 8 oz (78.2 kg)   Body mass index is 30.56 kg/m.   Physical Exam    Constitutional: Appears well-developed and well-nourished. No distress.  HENT:  Head: Normocephalic and atraumatic.  Neck: Neck supple. No tracheal deviation present. No thyromegaly present.  No cervical lymphadenopathy Cardiovascular: Normal rate, regular rhythm and normal heart sounds.   No murmur heard. No carotid bruit .  No edema Pulmonary/Chest: Effort normal and breath sounds normal. No respiratory distress. No has no wheezes. No rales.  Skin: Skin is warm and dry. Not diaphoretic.  Psychiatric: Normal mood and affect. Behavior is normal.  Assessment & Plan:    See Problem List for Assessment and Plan of chronic medical problems.

## 2016-07-07 NOTE — Patient Instructions (Addendum)

## 2016-07-07 NOTE — Assessment & Plan Note (Addendum)
Sweats profusely 3-4 times a day, every day She stopped the effexor for three weeks and there was no change Check tfts If tfts normal - consider endocrine referral

## 2016-07-08 ENCOUNTER — Encounter: Payer: Self-pay | Admitting: Internal Medicine

## 2016-07-08 ENCOUNTER — Other Ambulatory Visit: Payer: Self-pay | Admitting: Internal Medicine

## 2016-07-08 DIAGNOSIS — R61 Generalized hyperhidrosis: Secondary | ICD-10-CM

## 2016-07-08 NOTE — Progress Notes (Signed)
Noted, agreed.  Binnie Rail, MD

## 2016-07-11 ENCOUNTER — Ambulatory Visit (INDEPENDENT_AMBULATORY_CARE_PROVIDER_SITE_OTHER): Payer: Medicare Other | Admitting: *Deleted

## 2016-07-11 ENCOUNTER — Telehealth: Payer: Self-pay | Admitting: Cardiology

## 2016-07-11 DIAGNOSIS — I495 Sick sinus syndrome: Secondary | ICD-10-CM | POA: Diagnosis not present

## 2016-07-11 DIAGNOSIS — Z950 Presence of cardiac pacemaker: Secondary | ICD-10-CM | POA: Diagnosis not present

## 2016-07-11 DIAGNOSIS — I5022 Chronic systolic (congestive) heart failure: Secondary | ICD-10-CM

## 2016-07-11 NOTE — Telephone Encounter (Signed)
LMOVM reminding pt to send remote transmission.   

## 2016-07-12 ENCOUNTER — Encounter: Payer: Self-pay | Admitting: Cardiology

## 2016-07-12 NOTE — Progress Notes (Signed)
Remote pacemaker transmission.   

## 2016-07-14 ENCOUNTER — Telehealth: Payer: Self-pay

## 2016-07-14 NOTE — Progress Notes (Signed)
EPIC Encounter for ICM Monitoring  Patient Name: Anita Duke is a 75 y.o. female Date: 07/14/2016 Primary Care Physican: Binnie Rail, MD Primary Cardiologist: Irish Lack Electrophysiologist: Allred Dry Weight:    unknown Bi-V Pacing:  98%               Attempted ICM call and unable to reach. Left detailed message regarding transmission.  Transmission reviewed.   Thoraci impedance normal since 07/10/2016.    Follow-up plan: ICM clinic phone appointment on 08/21/2016.  Copy of ICM check sent to device physician.   ICM trend: 07/12/2016       [name], RN 07/14/2016 9:15 AM

## 2016-07-14 NOTE — Telephone Encounter (Signed)
Remote ICM transmission received.  Attempted patient call and left detailed message regarding transmission and next ICM scheduled for 08/21/2016.  Advised to return call for any fluid symptoms or questions.

## 2016-07-25 ENCOUNTER — Ambulatory Visit: Payer: Medicare Other

## 2016-07-26 ENCOUNTER — Encounter: Payer: Self-pay | Admitting: Cardiology

## 2016-08-08 LAB — CUP PACEART REMOTE DEVICE CHECK
Battery Voltage: 2.98 V
Implantable Lead Implant Date: 20150303
Implantable Lead Location: 753859
Implantable Lead Location: 753860
Implantable Lead Model: 5076
Implantable Lead Model: 5092
Implantable Pulse Generator Implant Date: 20150303
Lead Channel Impedance Value: 480 Ohm
Lead Channel Pacing Threshold Amplitude: 0.625 V
Lead Channel Pacing Threshold Amplitude: 1.25 V
Lead Channel Pacing Threshold Pulse Width: 0.4 ms
Lead Channel Sensing Intrinsic Amplitude: 10.3 mV
Lead Channel Setting Pacing Amplitude: 2 V
Lead Channel Setting Pacing Amplitude: 2.25 V
Lead Channel Setting Pacing Pulse Width: 0.4 ms
Lead Channel Setting Pacing Pulse Width: 0.6 ms
MDC IDC LEAD IMPLANT DT: 20130913
MDC IDC LEAD IMPLANT DT: 20130913
MDC IDC LEAD LOCATION: 753858
MDC IDC MSMT BATTERY REMAINING LONGEVITY: 91 mo
MDC IDC MSMT BATTERY REMAINING PERCENTAGE: 95.5 %
MDC IDC MSMT LEADCHNL LV IMPEDANCE VALUE: 410 Ohm
MDC IDC MSMT LEADCHNL LV PACING THRESHOLD PULSEWIDTH: 0.6 ms
MDC IDC PG MODEL: 3242
MDC IDC SESS DTM: 20171108171728
MDC IDC SET LEADCHNL RV SENSING SENSITIVITY: 3 mV
Pulse Gen Serial Number: 7548835

## 2016-08-11 MED FILL — JARDIANCE 10 MG TABLET: 10 | 30 days supply | Qty: 30 | Fill #1

## 2016-08-13 ENCOUNTER — Other Ambulatory Visit: Payer: Self-pay | Admitting: Internal Medicine

## 2016-08-13 NOTE — Progress Notes (Signed)
Subjective:    Patient ID: Anita Duke, female    DOB: [birth year], 75 y.o.   MRN: [id number]  HPI Referred by Dr Quay Burow.  Pt reports 2-3 years of moderately excessive diaphoresis throughout the body, worst at the head.  She has assoc rhinorrhea.  HTN has been well-controlled.  She has no h/o migraine headache, MTC, hypoglycemia, alcohol withdrawal, brain aneurysm, cocaine use, severe high blood pressure, and thyroid disease.  She says 24-HR urine catechols was neg in approx 1997.   Past Medical History:  Diagnosis Date  . Anemia    Hemoglobin 10.4, December, 2013  . Arthritis    "not bad; little in my hands; some in my knees" (11/04/2013)  . Asthma   . CAD (coronary artery disease)    LIMA to the LAD at time of mitral valve repair / LIMA atretic,, February, 2011  . Cataracts, bilateral   . 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 CHF (congestive heart failure) ([place])   . Colon polyp, hyperplastic   . COPD (chronic obstructive pulmonary disease) ([place])   . Depression   . Diverticulosis of colon   . GERD (gastroesophageal reflux disease)   . Hemorrhoids, internal   . High cholesterol   . HTN (hypertension)   . Mitral regurgitation    a. s/p repair with LAA ligation and MAZE at time of surgery  . Permanent atrial fibrillation (HCC)    a. s/p MAZE b. s/p AVN ablation  . Pulmonary HTN   . QT prolongation    Tikosyn and Effexor. QT prolonged October 13, 2011, peak is in dose reduced from 500  to -250 twice a day  . Type II diabetes mellitus ([place])     Past Surgical History:  Procedure Laterality Date  . ABDOMINAL HYSTERECTOMY  ?2002  . AV NODE ABLATION N/A 05/28/2012   Procedure: AV NODE ABLATION;  Surgeon: Thompson Grayer, MD;  Location: [facility] CATH LAB;  Service: Cardiovascular;  Laterality: N/A;  . BI-VENTRICULAR PACEMAKER UPGRADE N/A 11/04/2013   upgrade of previously implanted dual chamber pacemaker to STJ Quadra Allure CRTP by Dr Rayann Heman  . BREAST LUMPECTOMY Bilateral 1990's  . CARDIOVERSION   02/08/2012   Procedure: CARDIOVERSION;  Surgeon: Carlena Bjornstad, MD;  Location: [place];  Service: Cardiovascular;  Laterality: N/A;  . CARDIOVERSION  02/29/2012   Procedure: CARDIOVERSION;  Surgeon: Carlena Bjornstad, MD;  Location: [place];  Service: Cardiovascular;  Laterality: N/A;  . CARDIOVERSION  05/15/2012   Procedure: CARDIOVERSION;  Surgeon: Carlena Bjornstad, MD;  Location: [place];  Service: Cardiovascular;  Laterality: N/A;  . CATARACT EXTRACTION, BILATERAL  2017  . CHOLECYSTECTOMY  1995  . CORONARY ARTERY BYPASS GRAFT  2001   CABG X1 "at time of mitral valve repair" (12/04/2012  . DIAGNOSTIC LAPAROSCOPIC LIVER BIOPSY Left 12/03/2012   Procedure: DIAGNOSTIC LAPAROSCOPIC LIVER BIOPSY;  Surgeon: Stark Klein, MD;  Location: [place];  Service: General;  Laterality: Left;  . LAPAROSCOPIC RIGHT HEMI COLECTOMY  12/03/2012   Procedure: LAPAROSCOPIC RIGHT HEMI COLECTOMY;  Surgeon: Stark Klein, MD;  Location: [place];  Service: General;;  . MAZE  2001   w/ MVR & CABG  . MITRAL VALVE REPAIR  2001   "anterior and posterior leaflets" (12/04/2012)  . 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 TONSILLECTOMY AND ADENOIDECTOMY  ~ 1950  . UMBILICAL HERNIA REPAIR N/A 12/03/2012   Procedure: HERNIA REPAIR UMBILICAL ADULT;  Surgeon: Dorris Fetch  Barry Dienes, MD;  Location: [place];  Service: General;  Laterality: N/A;    Social History   Social History  . Marital status: Married    Spouse name: N/A  . Number of children: 0  . 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  [place] History Main Topics  . Smoking status: Never Smoker  . Smokeless tobacco: Never Used  . Alcohol use No  . Drug use: No  . Sexual activity: No   Other Topics Concern  . Not on file   Social History Narrative   Does not et regular exercise   Daily caffeine use          Current  Outpatient Prescriptions on File Prior to Visit  Medication Sig Dispense Refill  . ACCU-CHEK SOFTCLIX LANCETS lancets 1 each by Other route 2 (two) times daily. Use to check blood sugars twice a day Dx E11.9 100 each 3  . atorvastatin (LIPITOR) 40 MG tablet Take 1 tablet (40 mg total) by mouth daily. 90 tablet 3  . blood glucose meter kit and supplies KIT Dispense based on patient and insurance preference. Check sugars daily. NIDDM 1 each 0  . Blood Glucose Monitoring Suppl (ACCU-CHEK AVIVA PLUS) w/Device KIT Use to check blood sugars daily Dx E11.9 1 kit 0  . calcium carbonate (OS-CAL) 600 MG TABS Take 600 mg by mouth daily.     . carvedilol (COREG) 6.25 MG tablet Take 1 tablet by mouth  twice a day with meals 180 tablet 3  . cholecalciferol (VITAMIN D) 1000 UNITS tablet Take 6,000 Units by mouth daily.     . empagliflozin (JARDIANCE) 10 MG TABS tablet Take 10 mg by mouth daily. 30 tablet 5  . fish oil-omega-3 fatty acids 1000 MG capsule Take 1 g by mouth 2 (two) times daily.     . furosemide (LASIX) 40 MG tablet Take 0.5 tablets (20 mg total) by mouth daily. 45 tablet 1  . glucose blood (ACCU-CHEK AVIVA PLUS) test strip 1 each by Other route 2 (two) times daily. Use as instructed 100 each 3  . loperamide (IMODIUM) 2 MG capsule Take 1 capsule (2 mg total) by mouth daily as needed for diarrhea or loose stools. 30 capsule 10  . loratadine (CLARITIN) 10 MG tablet Take 10 mg by mouth daily.    . potassium chloride SA (K-DUR,KLOR-CON) 20 MEQ tablet Take 3 tablets by mouth  daily 270 tablet 2  . trandolapril (MAVIK) 4 MG tablet Take 1 tablet by mouth two  times daily for blood  pressure 180 tablet 1  . warfarin (COUMADIN) 2.5 MG tablet Take as directed by Anticoagulation Clinic 100 tablet 1   No current facility-administered medications on file prior to visit.     Allergies  Allergen Reactions  . Demerol [Meperidine] Nausea And Vomiting  . Morphine Other (See Comments)    vomiting  . Januvia  [Sitagliptin]     Headaches, did not feel well  . Metformin And Related Other (See Comments)    diarrhea  . Tetanus Toxoid Rash    "years ago"    Family History  Problem Relation Age of Onset  . Heart disease Father   . Diabetes Father   . Heart disease Sister     x 2  . Diabetes Mother   . Kidney disease Mother   . Thyroid disease Mother   .  Pancreatic cancer Maternal Grandfather   . Diabetes Paternal Grandfather   . Diabetes Paternal Grandmother   . Heart disease Paternal Uncle     x 6  . Prostate cancer Paternal Uncle   . Colon cancer Neg Hx   . Colon polyps Neg Hx   . Adrenal disorder Neg Hx     BP 128/74   Pulse 69   Ht 5' 3"  (1.6 m)   Wt 168 lb (76.2 kg)   SpO2 97%   BMI 29.76 kg/m   Review of Systems denies headache, flushing, pallor, n/v, syncope, weight loss, chest pain, sob, visual loss, palpitations, fever, arthralgias, menopausal sxs, easy bruising..  She has intermittent diarrhea and anxiety.      Objective:   Physical Exam VS: see vs page GEN: no distress HEAD: head: no deformity eyes: no periorbital swelling, no proptosis external nose and ears are normal mouth: no lesion seen.  No neurofibromata.  NECK: supple, thyroid is not enlarged CHEST WALL: no deformity LUNGS: clear to auscultation CV: reg rate and rhythm, no murmur ABD: abdomen is soft, nontender.  no hepatosplenomegaly.  not distended.  no hernia MUSCULOSKELETAL: muscle bulk and strength are grossly normal.  no obvious joint swelling.  gait is normal and steady.   EXTEMITIES: no edema PULSES: no carotid bruit NEURO:  cn 2-12 grossly intact.   readily moves all 4's.  sensation is intact to touch on all 4's. SKIN:  Normal texture and temperature.  No rash or suspicious lesion is visible.  No cafe-au-lait spot NODES:  None palpable at the neck PSYCH: alert, well-oriented.  Does not appear anxious nor depressed.   i personally reviewed electrocardiogram tracing (11/30/15):   Indication:  CAD Impression: paced  Lab Results  Component Value Date   TSH 0.88 07/07/2016   CT (2014): adrenal glands are normal.      Assessment & Plan:  Excessive diaphoresis, new to me, usually functional.  Patient is advised the following: Patient Instructions  A blood test and 24-HR urine, are requested for you today.  We'll let you know about the results.  I would be happy to see you back here as needed.

## 2016-08-15 ENCOUNTER — Ambulatory Visit: Payer: Medicare Other | Admitting: Endocrinology

## 2016-08-16 ENCOUNTER — Ambulatory Visit (INDEPENDENT_AMBULATORY_CARE_PROVIDER_SITE_OTHER): Payer: Medicare Other | Admitting: Endocrinology

## 2016-08-16 ENCOUNTER — Ambulatory Visit (INDEPENDENT_AMBULATORY_CARE_PROVIDER_SITE_OTHER): Payer: Medicare Other | Admitting: General Practice

## 2016-08-16 ENCOUNTER — Encounter: Payer: Self-pay | Admitting: Endocrinology

## 2016-08-16 VITALS — BP 128/74 | HR 69 | Ht 63.0 in | Wt 168.0 lb

## 2016-08-16 DIAGNOSIS — I4891 Unspecified atrial fibrillation: Secondary | ICD-10-CM

## 2016-08-16 DIAGNOSIS — R61 Generalized hyperhidrosis: Secondary | ICD-10-CM

## 2016-08-16 DIAGNOSIS — Z5181 Encounter for therapeutic drug level monitoring: Secondary | ICD-10-CM

## 2016-08-16 LAB — POCT INR: INR: 1.9

## 2016-08-16 NOTE — Progress Notes (Signed)
I have reviewed and agree with the plan. 

## 2016-08-16 NOTE — Patient Instructions (Signed)
A blood test and 24-HR urine, are requested for you today.  We'll let you know about the results.  I would be happy to see you back here as needed.

## 2016-08-16 NOTE — Patient Instructions (Signed)
Pre visit review using our clinic review tool, if applicable. No additional management support is needed unless otherwise documented below in the visit note. 

## 2016-08-18 ENCOUNTER — Other Ambulatory Visit: Payer: Self-pay | Admitting: Internal Medicine

## 2016-08-21 ENCOUNTER — Other Ambulatory Visit: Payer: Medicare Other

## 2016-08-21 ENCOUNTER — Ambulatory Visit (INDEPENDENT_AMBULATORY_CARE_PROVIDER_SITE_OTHER): Payer: Medicare Other

## 2016-08-21 ENCOUNTER — Telehealth: Payer: Self-pay

## 2016-08-21 DIAGNOSIS — I5022 Chronic systolic (congestive) heart failure: Secondary | ICD-10-CM | POA: Diagnosis not present

## 2016-08-21 DIAGNOSIS — Z950 Presence of cardiac pacemaker: Secondary | ICD-10-CM

## 2016-08-21 DIAGNOSIS — R61 Generalized hyperhidrosis: Secondary | ICD-10-CM

## 2016-08-21 LAB — CALCITONIN: Calcitonin: <2 pg/mL (ref <=5)

## 2016-08-21 NOTE — Telephone Encounter (Signed)
Attempted ICM call and left voice mail message requesting to send remote transmission.  Left direct ICM number.

## 2016-08-21 NOTE — Progress Notes (Signed)
Patient ID: Anita Duke, female   DOB: May 14, 1941, 75 y.o.   MRN: [id number]     Cardiology Office Note   Date:  08/22/2016   ID:  Anita, Duke August 28, 1941, MRN [id number]  PCP:  Binnie Rail, MD    No chief complaint on file. f/u pacer, nonischemic cardiomyopathy, mitral valve disease   Wt Readings from Last 3 Encounters:  08/22/16 167 lb (75.8 kg)  08/16/16 168 lb (76.2 kg)  07/07/16 172 lb 8 oz (78.2 kg)       History of Present Illness: LEZLIE Duke is a 75 y.o. female  Who has had a mitral valve repair, Maze procedure at the [facility] clinic in 2001.  She had a LIMA graft also placed at that time but the LIMA to LAD was noted to be atretic, most recently in 2014 at the time of cath.  50-60% mid LAD lesion noted at that time.  She has had variable LVEF as well.  EF was 40% in April 2016.  She has had persistent AFib and is maintained on Coumadin.  She is checked every six weeks.    She walks her dog several times a day.    No recent swelling or SHOB.   No chest pain or SHOB.  Walking the dog is most strenuous activity.  No exertional SHOB or CP.   She has hot flashes with intermittent sweating- this has happened for the past 2 years.  Her biggest issue now is that her husband has stage IV squamous cell cancer that has metastasized to his lung. He is apparently receiving some type of palliative treatment. She does not think he'll be much longer before he passes away. This is a source of great stress to her. This also limits how much time she can spend taking care of herself.    Past Medical History:  Diagnosis Date  . Anemia    Hemoglobin 10.4, December, 2013  . Arthritis    "not bad; little in my hands; some in my knees" (11/04/2013)  . Asthma   . CAD (coronary artery disease)    LIMA to the LAD at time of mitral valve repair / LIMA atretic,, February, 2011  . Cataracts, bilateral   . 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 CHF (congestive heart failure) ([place])   . Colon  polyp, hyperplastic   . COPD (chronic obstructive pulmonary disease) ([place])   . Depression   . Diverticulosis of colon   . GERD (gastroesophageal reflux disease)   . Hemorrhoids, internal   . High cholesterol   . HTN (hypertension)   . Mitral regurgitation    a. s/p repair with LAA ligation and MAZE at time of surgery  . Permanent atrial fibrillation (HCC)    a. s/p MAZE b. s/p AVN ablation  . Pulmonary HTN   . QT prolongation    Tikosyn and Effexor. QT prolonged October 13, 2011, peak is in dose reduced from 500  to -250 twice a day  . Type II diabetes mellitus ([place])     Past Surgical History:  Procedure Laterality Date  . ABDOMINAL HYSTERECTOMY  ?2002  . AV NODE ABLATION N/A 05/28/2012   Procedure: AV NODE ABLATION;  Surgeon: Thompson Grayer, MD;  Location: [facility] CATH LAB;  Service: Cardiovascular;  Laterality: N/A;  . BI-VENTRICULAR PACEMAKER UPGRADE N/A 11/04/2013   upgrade of previously implanted dual chamber pacemaker to STJ Quadra Allure CRTP by Dr Rayann Heman  . BREAST LUMPECTOMY Bilateral 1990's  .  CARDIOVERSION  02/08/2012   Procedure: CARDIOVERSION;  Surgeon: Carlena Bjornstad, MD;  Location: [place];  Service: Cardiovascular;  Laterality: N/A;  . CARDIOVERSION  02/29/2012   Procedure: CARDIOVERSION;  Surgeon: Carlena Bjornstad, MD;  Location: [place];  Service: Cardiovascular;  Laterality: N/A;  . CARDIOVERSION  05/15/2012   Procedure: CARDIOVERSION;  Surgeon: Carlena Bjornstad, MD;  Location: [place];  Service: Cardiovascular;  Laterality: N/A;  . CATARACT EXTRACTION, BILATERAL  2017  . CHOLECYSTECTOMY  1995  . CORONARY ARTERY BYPASS GRAFT  2001   CABG X1 "at time of mitral valve repair" (12/04/2012  . DIAGNOSTIC LAPAROSCOPIC LIVER BIOPSY Left 12/03/2012   Procedure: DIAGNOSTIC LAPAROSCOPIC LIVER BIOPSY;  Surgeon: Stark Klein, MD;  Location: [place];  Service: General;  Laterality: Left;  . LAPAROSCOPIC RIGHT HEMI COLECTOMY  12/03/2012   Procedure: LAPAROSCOPIC RIGHT HEMI COLECTOMY;  Surgeon: Stark Klein, MD;  Location: [place];  Service: General;;  . MAZE  2001   w/ MVR & CABG  . MITRAL VALVE REPAIR  2001   "anterior and posterior leaflets" (12/04/2012)  . 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 TONSILLECTOMY AND ADENOIDECTOMY  ~ 1950  . UMBILICAL HERNIA REPAIR N/A 12/03/2012   Procedure: HERNIA REPAIR UMBILICAL ADULT;  Surgeon: Stark Klein, MD;  Location: [place] [facility];  Service: General;  Laterality: N/A;     Current Outpatient Prescriptions  Medication Sig Dispense Refill  . ACCU-CHEK SOFTCLIX LANCETS lancets 1 each by Other route 2 (two) times daily. Use to check blood sugars twice a day Dx E11.9 100 each 3  . atorvastatin (LIPITOR) 40 MG tablet Take 1 tablet (40 mg total) by mouth daily. 90 tablet 3  . blood glucose meter kit and supplies KIT Dispense based on patient and insurance preference. Check sugars daily. NIDDM 1 each 0  . Blood Glucose Monitoring Suppl (ACCU-CHEK AVIVA PLUS) w/Device KIT Use to check blood sugars daily Dx E11.9 1 kit 0  . calcium carbonate (OS-CAL) 600 MG TABS Take 600 mg by mouth daily.     . carvedilol (COREG) 6.25 MG tablet Take 1 tablet by mouth  twice a day with meals 180 tablet 3  . cholecalciferol (VITAMIN D) 1000 UNITS tablet Take 6,000 Units by mouth daily.     . empagliflozin (JARDIANCE) 10 MG TABS tablet Take 10 mg by mouth daily. 90 tablet 1  . fish oil-omega-3 fatty acids 1000 MG capsule Take 1 g by mouth 2 (two) times daily.     . furosemide (LASIX) 40 MG tablet Take 0.5 tablets (20 mg total) by mouth daily. 45 tablet 1  . glucose blood (ACCU-CHEK AVIVA PLUS) test strip 1 each by Other route 2 (two) times daily. Use as instructed 100 each 3  . loperamide (IMODIUM) 2 MG capsule Take 1 capsule (2 mg total) by mouth daily as needed for diarrhea or loose stools. 30 capsule 10  . loratadine (CLARITIN) 10 MG tablet Take 10 mg by mouth daily.    . potassium chloride SA (K-DUR,KLOR-CON)  20 MEQ tablet Take 3 tablets by mouth  daily 270 tablet 2  . trandolapril (MAVIK) 4 MG tablet Take 1 tablet by mouth two  times daily for blood  pressure 180 tablet 1  . venlafaxine XR (EFFEXOR-XR) 37.5 MG 24 hr capsule TAKE 1 CAPSULE BY MOUTH  EVERY EVENING 90 capsule 1  . warfarin (COUMADIN) 2.5 MG tablet Take as directed by Anticoagulation  Clinic 100 tablet 1   No current facility-administered medications for this visit.     Allergies:   Demerol [meperidine]; Morphine; Januvia [sitagliptin]; Metformin and related; and Tetanus toxoid    Social History:  The patient  reports that she has never smoked. She has never used smokeless tobacco. She reports that she does not drink alcohol or use drugs.   Family History:  The patient's family history includes Diabetes in her father, mother, paternal grandfather, and paternal grandmother; Heart disease in her father, paternal uncle, and sister; Kidney disease in her mother; Pancreatic cancer in her maternal grandfather; Prostate cancer in her paternal uncle; Thyroid disease in her mother.    ROS:  Please see the history of present illness.   Otherwise, review of systems are positive for forgetting Lasix some days.   All other systems are reviewed and negative.    PHYSICAL EXAM: VS:  BP 130/74   Pulse 67   Ht 5' 3"  (1.6 m)   Wt 167 lb (75.8 kg)   BMI 29.58 kg/m  , BMI Body mass index is 29.58 kg/m. GEN: Well nourished, well developed, in no acute distress  HEENT: normal  Neck: no JVD, carotid bruits, or masses Cardiac: RRR; no murmurs, rubs, or gallops,no edema  Respiratory:  clear to auscultation bilaterally, normal work of breathing GI: soft, nontender, nondistended, + BS MS: no deformity or atrophy  Skin: warm and dry, no rash Neuro:  Strength and sensation are intact Psych: euthymic mood, full affect    Recent Labs: 01/05/2016: Hemoglobin 14.6; Platelets 201.0 07/07/2016: ALT 16; BUN 15; Creatinine, Ser 0.57; Potassium 3.8; Sodium  142; TSH 0.88   Lipid Panel    Component Value Date/Time   CHOL 149 01/05/2016 0942   TRIG 217.0 (H) 01/05/2016 0942   HDL 51.60 01/05/2016 0942   CHOLHDL 3 01/05/2016 0942   VLDL 43.4 (H) 01/05/2016 0942   LDLCALC 52 06/11/2015 1101   LDLDIRECT 65.0 01/05/2016 0942     Other studies Reviewed: Additional studies/ records that were reviewed today with results demonstrating: 2016 echo, 2014 cath reviewed.  Prior transient RV dsfunction.   ASSESSMENT AND PLAN:  1. Nonischemic cardiac myopathy/chronic systolic dysfunction: Last ejection fraction noted was 40%. She is not having any symptoms of congestive heart failure.  She denies symptoms. She also notes that on some days, she skips her Lasix because she will not be at home and cannot really use the bathroom. I have asked her to weigh herself daily. This will also help guide her as to when it is imperative that she takes her Lasix. She should also look for shortness of breath and swelling. 2.  Mitral valve repair: She needs to continue with antibiotic prophylaxis for dental procedures. 3. Atrial fibrillation: Status post Maze procedure. Coumadin for stroke prevention. This has been well maintained. 4. Pacemaker: She follows up with Dr. Rayann Heman. 5. Hypertriglyceridemia/hyperlipidemia:  TG elevaed in 5/17 Diet control.  Continue atorvastatin. Continue aggressive blood sugar control.  She eats out a lot.  Her DM is not optimally controlled per her report.  Try to improve diet by eating more fresh foods.   Current medicines are reviewed at length with the patient today.  The patient concerns regarding her medicines were addressed.  The following changes have been made:  I have given her the flexibility of adjusting her Lasix dose based on her swelling and weights at home.  Labs/ tests ordered today include:  No orders of the defined types were placed  in this encounter.   Recommend 150 minutes/week of aerobic exercise Low fat, low carb,  high fiber diet recommended  Disposition:   FU in 9 months   Signed, Larae Grooms, MD  08/22/2016 1:28 PM    [facility] [place], [place], [place]  [address] Phone: [phone]; Fax: [phone]

## 2016-08-22 ENCOUNTER — Ambulatory Visit (INDEPENDENT_AMBULATORY_CARE_PROVIDER_SITE_OTHER): Payer: Medicare Other | Admitting: Interventional Cardiology

## 2016-08-22 ENCOUNTER — Other Ambulatory Visit: Payer: Self-pay | Admitting: Emergency Medicine

## 2016-08-22 ENCOUNTER — Encounter: Payer: Self-pay | Admitting: Interventional Cardiology

## 2016-08-22 VITALS — BP 130/74 | HR 67 | Ht 63.0 in | Wt 167.0 lb

## 2016-08-22 DIAGNOSIS — I4819 Other persistent atrial fibrillation: Secondary | ICD-10-CM

## 2016-08-22 DIAGNOSIS — I481 Persistent atrial fibrillation: Secondary | ICD-10-CM

## 2016-08-22 DIAGNOSIS — Z9889 Other specified postprocedural states: Secondary | ICD-10-CM

## 2016-08-22 DIAGNOSIS — Z950 Presence of cardiac pacemaker: Secondary | ICD-10-CM

## 2016-08-22 DIAGNOSIS — Z7901 Long term (current) use of anticoagulants: Secondary | ICD-10-CM

## 2016-08-22 DIAGNOSIS — E782 Mixed hyperlipidemia: Secondary | ICD-10-CM

## 2016-08-22 DIAGNOSIS — E1159 Type 2 diabetes mellitus with other circulatory complications: Secondary | ICD-10-CM

## 2016-08-22 DIAGNOSIS — Z951 Presence of aortocoronary bypass graft: Secondary | ICD-10-CM

## 2016-08-22 DIAGNOSIS — I2510 Atherosclerotic heart disease of native coronary artery without angina pectoris: Secondary | ICD-10-CM

## 2016-08-22 MED ORDER — EMPAGLIFLOZIN 10 MG PO TABS: 10.0000 mg | ORAL_TABLET | Freq: Every day | ORAL | 1 refills | Status: DC

## 2016-08-22 NOTE — Progress Notes (Signed)
EPIC Encounter for ICM Monitoring  Patient Name: Anita Duke is a 75 y.o. female Date: 08/22/2016 Primary Care Physican: Binnie Rail, MD Primary Cardiologist:[place] Electrophysiologist: Allred Dry Weight:unknown Bi-V Pacing: 98%      Heart Failure questions reviewed, pt asymptomatic   Thoracic impedance abnormal suggesting dryness.  Recommendations:  Encouraged to drink enough fluids to stay hydrated. No changes.  Reinforced to limit low salt food choices to 2000 mg day and limiting fluid intake to < 2 liters per day. Encouraged to call for fluid symptoms.    Follow-up plan: ICM clinic phone appointment on 09/22/2016.  Copy of ICM check sent to cardiologist and device physician.   ICM trend: 08/22/2016       [name], RN 08/22/2016 4:47 PM

## 2016-08-22 NOTE — Patient Instructions (Signed)
**Note De-identified Anita Duke Obfuscation** Medication Instructions:  Same-no changes  Labwork: None  Testing/Procedures: None  Follow-Up: Your physician wants you to follow-up in: 1 year. You will receive a reminder letter in the mail two months in advance. If you don't receive a letter, please call our office to schedule the follow-up appointment.      If you need a refill on your cardiac medications before your next appointment, please call your pharmacy.   

## 2016-08-24 ENCOUNTER — Telehealth: Payer: Self-pay | Admitting: Endocrinology

## 2016-08-24 NOTE — Telephone Encounter (Signed)
Pt requests that the results of the lab work and the results of the 24 hour urine be resulted to MyChart.

## 2016-08-25 LAB — CATECHOLAMINES, FRACTIONATED, URINE, 24 HOUR
CREATININE, URINE MG/DAY-CATEUR: 0.86 g/(24.h) (ref 0.63–2.50)
Calculated Total (E+NE): 98 mcg/24 h (ref 26–121)
DOPAMINE, 24 HR URINE: 207 ug/(24.h) (ref 52–480)
NOREPINEPHRINE, 24 HR UR: 98 ug/(24.h) (ref 15–100)
Total Volume - CF 24Hr U: 1900 mL

## 2016-08-27 LAB — METANEPHRINES, URINE, 24 HOUR
Metaneph Total, Ur: 486 mcg/24 h (ref 224–832)
Metanephrines, Ur: 60 mcg/24 h — ABNORMAL LOW (ref 90–315)
Normetanephrine, 24H Ur: 426 mcg/24 h (ref 122–676)

## 2016-08-29 NOTE — Telephone Encounter (Signed)
Results released on 08/28/2016.

## 2016-09-06 ENCOUNTER — Telehealth: Payer: Self-pay | Admitting: *Deleted

## 2016-09-06 MED ORDER — LORAZEPAM 0.5 MG PO TABS: 0.2500 mg | ORAL_TABLET | Freq: Two times a day (BID) | ORAL | 0 refills | Status: DC | PRN

## 2016-09-06 MED FILL — LORazepam 0.5 MG TABS: 0.5 | 8 days supply | Qty: 15 | Fill #0

## 2016-09-06 NOTE — Telephone Encounter (Signed)
Notified pt w/MD response. Faxed script to [facility]...Johny Chess

## 2016-09-06 NOTE — Telephone Encounter (Signed)
Please give my sympathies.   Please fax ativan to pharmacy.  Advise her this may make her drowsy -start with 1/2 of a pill - can take an additional 1/2 if needed.

## 2016-09-06 NOTE — Telephone Encounter (Signed)
Rec'd call pt states her husband passed away on 2023-10-01, and she is needing something for her nerves/anxiety. " She was crying when she call". She states they are having the viewing tonight and need something to help her get through it...Anita Duke

## 2016-09-25 NOTE — Progress Notes (Signed)
No ICM remote transmission received on 09/22/2016.   Next ICM transmission scheduled for 10/11/2016.

## 2016-09-26 ENCOUNTER — Ambulatory Visit (INDEPENDENT_AMBULATORY_CARE_PROVIDER_SITE_OTHER): Payer: Medicare Other | Admitting: General Practice

## 2016-09-26 DIAGNOSIS — Z5181 Encounter for therapeutic drug level monitoring: Secondary | ICD-10-CM | POA: Diagnosis not present

## 2016-09-26 DIAGNOSIS — I4891 Unspecified atrial fibrillation: Secondary | ICD-10-CM

## 2016-09-26 LAB — POCT INR: INR: 1.5

## 2016-09-26 NOTE — Patient Instructions (Signed)
Pre visit review using our clinic review tool, if applicable. No additional management support is needed unless otherwise documented below in the visit note. 

## 2016-10-02 ENCOUNTER — Other Ambulatory Visit: Payer: Self-pay | Admitting: Internal Medicine

## 2016-10-02 DIAGNOSIS — Z1231 Encounter for screening mammogram for malignant neoplasm of breast: Secondary | ICD-10-CM

## 2016-10-11 ENCOUNTER — Ambulatory Visit (INDEPENDENT_AMBULATORY_CARE_PROVIDER_SITE_OTHER): Payer: Medicare Other | Admitting: *Deleted

## 2016-10-11 DIAGNOSIS — Z950 Presence of cardiac pacemaker: Secondary | ICD-10-CM

## 2016-10-11 DIAGNOSIS — I495 Sick sinus syndrome: Secondary | ICD-10-CM

## 2016-10-11 DIAGNOSIS — I509 Heart failure, unspecified: Secondary | ICD-10-CM | POA: Diagnosis not present

## 2016-10-11 NOTE — Progress Notes (Signed)
Remote pacemaker transmission.   

## 2016-10-12 NOTE — Progress Notes (Signed)
EPIC Encounter for ICM Monitoring  Patient Name: Anita Duke is a 76 y.o. female Date: 10/12/2016 Primary Care Physican: Binnie Rail, MD Primary Cardiologist:[place] Electrophysiologist: Allred Dry Weight:unknown Bi-V Pacing: 98%    Attempted call to patient and unable to reach.  Left message to return call.  Transmission reviewed.   Thoracic impedance abnormal suggesting fluid accumulation but is almost back at basline.  Impedance was showing decreased from 09/04/2016 to 09/16/2016 and 10/05/2016 until today.    Recommendations: NONE - Unable to reach patient   Follow-up plan: ICM clinic phone appointment on 10/26/2016 to recheck fluid levels since there has been large valleys in daily impedance in the last month.  Copy of ICM check sent to primary cardiologist and device physician.   3 month ICM trend: 10/11/2016   1 Year ICM trend:      [name], RN 10/12/2016 3:11 PM

## 2016-10-13 ENCOUNTER — Encounter: Payer: Self-pay | Admitting: Cardiology

## 2016-10-14 LAB — CUP PACEART REMOTE DEVICE CHECK
Date Time Interrogation Session: 20180210185743
Implantable Lead Implant Date: 20130913
Implantable Lead Implant Date: 20130913
Implantable Lead Location: 753858
Implantable Lead Location: 753859
Implantable Pulse Generator Implant Date: 20150303
MDC IDC LEAD IMPLANT DT: 20150303
MDC IDC LEAD LOCATION: 753860
MDC IDC PG SERIAL: 7548835
Pulse Gen Model: 3242

## 2016-10-16 ENCOUNTER — Other Ambulatory Visit: Payer: Self-pay | Admitting: Interventional Cardiology

## 2016-10-16 ENCOUNTER — Other Ambulatory Visit: Payer: Self-pay | Admitting: Internal Medicine

## 2016-10-18 ENCOUNTER — Ambulatory Visit (INDEPENDENT_AMBULATORY_CARE_PROVIDER_SITE_OTHER): Payer: Medicare Other | Admitting: General Practice

## 2016-10-18 ENCOUNTER — Other Ambulatory Visit: Payer: Self-pay | Admitting: Internal Medicine

## 2016-10-18 DIAGNOSIS — Z5181 Encounter for therapeutic drug level monitoring: Secondary | ICD-10-CM

## 2016-10-18 DIAGNOSIS — I4891 Unspecified atrial fibrillation: Secondary | ICD-10-CM

## 2016-10-18 LAB — POCT INR: INR: 1.7

## 2016-10-18 NOTE — Patient Instructions (Signed)
Pre visit review using our clinic review tool, if applicable. No additional management support is needed unless otherwise documented below in the visit note. 

## 2016-10-18 NOTE — Progress Notes (Signed)
I have reviewed and agree with the plan. 

## 2016-10-26 ENCOUNTER — Telehealth: Payer: Self-pay | Admitting: Cardiology

## 2016-10-26 NOTE — Telephone Encounter (Signed)
LMOVM reminding pt to send remote transmission.   

## 2016-10-27 ENCOUNTER — Encounter: Payer: Self-pay | Admitting: Cardiology

## 2016-10-31 ENCOUNTER — Other Ambulatory Visit: Payer: Self-pay | Admitting: Internal Medicine

## 2016-11-01 NOTE — Telephone Encounter (Signed)
Last INR visit 10/18/16, please advise.

## 2016-11-06 ENCOUNTER — Telehealth: Payer: Self-pay

## 2016-11-06 DIAGNOSIS — C182 Malignant neoplasm of ascending colon: Secondary | ICD-10-CM | POA: Diagnosis not present

## 2016-11-06 NOTE — Telephone Encounter (Signed)
Happy to help.  She needs extender visit since she's on coumadin (or visit with me, whichever is first available).  Thanks

## 2016-11-06 NOTE — Telephone Encounter (Signed)
-----   Message from Stark Klein, MD sent at 11/06/2016 12:11 PM EST ----- Can you do repeat colonoscopy this may on this lady?  Prior colon cancer, had polyps on 1 year colonoscopy 2015 with kaplan.  Due 01/2017.  She saw you in the office once.    She is on coumadin in terms of planning.  St jude's valve. tx FB

## 2016-11-06 NOTE — Telephone Encounter (Signed)
Pt has been advised of the appt with Dr Ardis Hughs.

## 2016-11-07 ENCOUNTER — Ambulatory Visit (INDEPENDENT_AMBULATORY_CARE_PROVIDER_SITE_OTHER): Payer: Medicare Other | Admitting: Gastroenterology

## 2016-11-07 ENCOUNTER — Encounter: Payer: Self-pay | Admitting: Gastroenterology

## 2016-11-07 ENCOUNTER — Telehealth: Payer: Self-pay

## 2016-11-07 VITALS — BP 132/60 | HR 80 | Ht 63.0 in | Wt 161.0 lb

## 2016-11-07 DIAGNOSIS — Z85038 Personal history of other malignant neoplasm of large intestine: Secondary | ICD-10-CM

## 2016-11-07 MED ORDER — NA SULFATE-K SULFATE-MG SULF 17.5-3.13-1.6 GM/177ML PO SOLN: 1.0000 | Freq: Once | ORAL | 0 refills | Status: AC

## 2016-11-07 MED FILL — SUPREP BOWEL PREP KIT: 17.5-3.13-1 | 2 days supply | Qty: 354 | Fill #0

## 2016-11-07 NOTE — Patient Instructions (Addendum)
You will be set up for a colonoscopy this May. We will communicate with your cardiologist about the safety of holding your coumadin for 5 days prior to that apt (Dr. Lilia Argue).

## 2016-11-07 NOTE — Telephone Encounter (Signed)
11/07/2016   RE: Anita Duke DOB: [birth year] MRN: [id number]   Dear Dr Irish Lack,    We have scheduled the above patient for an endoscopic procedure. Our records show that she is on anticoagulation therapy.   Please advise as to how long the patient may come off her therapy of Coumadin  prior to the procedure, which is scheduled for 01/03/17.  Please fax back/ or route to Christian Mate RN at [phone].   Sincerely,    Christian Mate RN

## 2016-11-07 NOTE — Telephone Encounter (Signed)
Please advise regarding anticoagulation.

## 2016-11-07 NOTE — Progress Notes (Signed)
Review of pertinent gastrointestinal problems: 1. Colon cancer, T2 N0 M0 cecal cancer diagnosed 4 2014 during colonoscopy with Dr. Deatra Ina. She underwent laparoscopic "partial colectomy" 4 2014 with Dr. Barry Dienes. Colonoscopy 01/2014 Dr. Deatra Ina showed essentially normal anastomosis, 2 subCM adenomas were also removed.    HPI: This is a  very pleasant 76 year old woman whom I last saw about 1 year ago  Chief complaint is personal history of colon cancer  She is having no GI symptoms. No bleeding, no constipation or changes in her bowels.  She has been on Coumadin for 15-20 years for atrial fibrillation, mitral valve  ROS: complete GI ROS as described in HPI.  Constitutional:  No unintentional weight loss   Past Medical History:  Diagnosis Date  . Anemia    Hemoglobin 10.4, December, 2013  . Arthritis    "not bad; little in my hands; some in my knees" (11/04/2013)  . Asthma   . CAD (coronary artery disease)    LIMA to the LAD at time of mitral valve repair / LIMA atretic,, February, 2011  . Cataracts, bilateral   . Cecal cancer ([place]) 2014  . CHF (congestive heart failure) ([place])   . Colon polyp, hyperplastic   . COPD (chronic obstructive pulmonary disease) ([place])   . Depression   . Diverticulosis of colon   . GERD (gastroesophageal reflux disease)   . Hemorrhoids, internal   . High cholesterol   . HTN (hypertension)   . Mitral regurgitation    a. s/p repair with LAA ligation and MAZE at time of surgery  . Permanent atrial fibrillation (HCC)    a. s/p MAZE b. s/p AVN ablation  . Pulmonary HTN   . QT prolongation    Tikosyn and Effexor. QT prolonged October 13, 2011, peak is in dose reduced from 500  to -250 twice a day  . Type II diabetes mellitus ([place])     Past Surgical History:  Procedure Laterality Date  . ABDOMINAL HYSTERECTOMY  ?2002  . AV NODE ABLATION N/A 05/28/2012   Procedure: AV NODE ABLATION;  Surgeon: Thompson Grayer, MD;  Location: [facility] CATH [facility];  Service: Cardiovascular;   Laterality: N/A;  . BI-VENTRICULAR PACEMAKER UPGRADE N/A 11/04/2013   upgrade of previously implanted dual chamber pacemaker to STJ Quadra Allure CRTP by Dr Rayann Heman  . BREAST LUMPECTOMY Bilateral 1990's  . CARDIOVERSION  02/08/2012   Procedure: CARDIOVERSION;  Surgeon: Carlena Bjornstad, MD;  Location: [place];  Service: Cardiovascular;  Laterality: N/A;  . CARDIOVERSION  02/29/2012   Procedure: CARDIOVERSION;  Surgeon: Carlena Bjornstad, MD;  Location: [place];  Service: Cardiovascular;  Laterality: N/A;  . CARDIOVERSION  05/15/2012   Procedure: CARDIOVERSION;  Surgeon: Carlena Bjornstad, MD;  Location: [place];  Service: Cardiovascular;  Laterality: N/A;  . CATARACT EXTRACTION, BILATERAL  2017  . CHOLECYSTECTOMY  1995  . CORONARY ARTERY BYPASS GRAFT  2001   CABG X1 "at time of mitral valve repair" (12/04/2012  . DIAGNOSTIC LAPAROSCOPIC LIVER BIOPSY Left 12/03/2012   Procedure: DIAGNOSTIC LAPAROSCOPIC LIVER BIOPSY;  Surgeon: Stark Klein, MD;  Location: [place];  Service: General;  Laterality: Left;  . LAPAROSCOPIC RIGHT HEMI COLECTOMY  12/03/2012   Procedure: LAPAROSCOPIC RIGHT HEMI COLECTOMY;  Surgeon: Stark Klein, MD;  Location: [place];  Service: General;;  . MAZE  2001   w/ MVR & CABG  . MITRAL VALVE REPAIR  2001   "anterior and posterior leaflets" (12/04/2012)  . 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 TONSILLECTOMY AND ADENOIDECTOMY  ~ 1950  . UMBILICAL HERNIA REPAIR N/A 12/03/2012   Procedure: HERNIA REPAIR UMBILICAL ADULT;  Surgeon: Stark Klein, MD;  Location: [facility];  Service: General;  Laterality: N/A;    Current Outpatient Prescriptions  Medication Sig Dispense Refill  . ACCU-CHEK AVIVA PLUS test strip USE 2 TIMES DAILY AS  DIRECTED 200 each 2  . ACCU-CHEK SOFTCLIX LANCETS lancets USE TO CHECK BLOOD SUGARS  TWICE A DAY 200 each 2  . atorvastatin (LIPITOR) 40 MG tablet Take 1 tablet (40 mg total) by mouth daily. 90 tablet 3  .  blood glucose meter kit and supplies KIT Dispense based on patient and insurance preference. Check sugars daily. NIDDM 1 each 0  . Blood Glucose Monitoring Suppl (ACCU-CHEK AVIVA PLUS) w/Device KIT Use to check blood sugars daily Dx E11.9 1 kit 0  . calcium carbonate (OS-CAL) 600 MG TABS Take 600 mg by mouth daily.     . carvedilol (COREG) 6.25 MG tablet Take 1 tablet by mouth  twice a day with meals 180 tablet 3  . cholecalciferol (VITAMIN D) 1000 UNITS tablet Take 6,000 Units by mouth daily.     . empagliflozin (JARDIANCE) 10 MG TABS tablet Take 10 mg by mouth daily. 90 tablet 1  . fish oil-omega-3 fatty acids 1000 MG capsule Take 1 g by mouth 2 (two) times daily.     . furosemide (LASIX) 40 MG tablet TAKE ONE-HALF TABLET BY  MOUTH DAILY 45 tablet 2  . loperamide (IMODIUM) 2 MG capsule Take 1 capsule (2 mg total) by mouth daily as needed for diarrhea or loose stools. 30 capsule 10  . loratadine (CLARITIN) 10 MG tablet Take 10 mg by mouth daily.    . potassium chloride SA (K-DUR,KLOR-CON) 20 MEQ tablet Take 3 tablets by mouth  daily 270 tablet 2  . trandolapril (MAVIK) 4 MG tablet TAKE 1 TABLET BY MOUTH TWO  TIMES DAILY FOR BLOOD  PRESSURE 180 tablet 1  . venlafaxine XR (EFFEXOR-XR) 37.5 MG 24 hr capsule TAKE 1 CAPSULE BY MOUTH  EVERY EVENING 90 capsule 1  . warfarin (COUMADIN) 2.5 MG tablet TAKE AS DIRECTED BY  ANTICOAGULATION CLINIC 100 tablet 1   No current facility-administered medications for this visit.     Allergies as of 11/07/2016 - Review Complete 11/07/2016  Allergen Reaction Noted  . Demerol [meperidine] Nausea And Vomiting 10/31/2012  . Morphine Other (See Comments)   . Januvia [sitagliptin]  01/05/2016  . Metformin and related Other (See Comments) 10/08/2015  . Tetanus toxoid Rash     Family History  Problem Relation Age of Onset  . Heart disease Father   . Diabetes Father   . Heart disease Sister     x 2  . Diabetes Mother   . Kidney disease Mother   . Thyroid  disease Mother   . Pancreatic cancer Maternal Grandfather   . Diabetes Paternal Grandfather   . Diabetes Paternal Grandmother   . Heart disease Paternal Uncle     x 6  . Prostate cancer Paternal Uncle   . Colon cancer Neg Hx   . Colon polyps Neg Hx   . Adrenal disorder Neg Hx     Social History   Social History  . Marital status: Married    Spouse name: N/A  . Number of children: 0  . Years of education: N/A   Occupational History  . [employer]  Technical brewer  .  [place] History Main Topics  . Smoking status: Never Smoker  . Smokeless tobacco: Never Used  . Alcohol use No  . Drug use: No  . Sexual activity: No   Other Topics Concern  . Not on file   Social History Narrative   Does not et regular exercise   Daily caffeine use           Physical Exam: BP 132/60   Pulse 80   Ht 5' 3"  (1.6 m)   Wt 161 lb (73 kg)   BMI 28.52 kg/m  Constitutional: generally well-appearing Psychiatric: alert and oriented x3 Abdomen: soft, nontender, nondistended, no obvious ascites, no peritoneal signs, normal bowel sounds No peripheral edema noted in lower extremities  Assessment and plan: 76 y.o. female with Personal history of colon cancer, chronic use of blood thinners  We will schedule her for colonoscopy in May of this year. That will be 3 years from the time of her last colonoscopy. This is standard protocol following colon cancer surgery. She is on a blood thinner and understands that puts her at increased risk for bleeding complications during a colonoscopy. We've asked that she stop the Coumadin 5 days prior to the procedure. We will communicate with her cardiologist to make sure he agrees with that recommendation.  Please see the "Patient Instructions" section for addition details about the plan.  Owens Loffler, MD [facility] 11/07/2016, 3:48 PM

## 2016-11-08 NOTE — Telephone Encounter (Signed)
The pt has been notified and will call with any questions.  She verbalized 5 day hold of coumadin

## 2016-11-08 NOTE — Telephone Encounter (Signed)
Pt on Coumadin for afib with CHADS2 score of 4 (HTN, HF, DM, age) but no hx of stroke. Ok to hold Coumadin for 5 days prior to endoscopy. Clearance routed to Christian Mate, RN.

## 2016-11-08 NOTE — Telephone Encounter (Signed)
See alternate note  

## 2016-11-09 DIAGNOSIS — M1711 Unilateral primary osteoarthritis, right knee: Secondary | ICD-10-CM | POA: Diagnosis not present

## 2016-11-09 DIAGNOSIS — M25551 Pain in right hip: Secondary | ICD-10-CM | POA: Diagnosis not present

## 2016-11-09 DIAGNOSIS — M4310 Spondylolisthesis, site unspecified: Secondary | ICD-10-CM | POA: Diagnosis not present

## 2016-11-09 DIAGNOSIS — M25552 Pain in left hip: Secondary | ICD-10-CM | POA: Diagnosis not present

## 2016-11-09 DIAGNOSIS — M5136 Other intervertebral disc degeneration, lumbar region: Secondary | ICD-10-CM | POA: Diagnosis not present

## 2016-11-09 MED FILL — METHYLPREDNISOLONE 4 MG TAB: 4 | 6 days supply | Qty: 21 | Fill #0

## 2016-11-15 ENCOUNTER — Ambulatory Visit (INDEPENDENT_AMBULATORY_CARE_PROVIDER_SITE_OTHER): Payer: Medicare Other | Admitting: Podiatry

## 2016-11-15 ENCOUNTER — Ambulatory Visit (INDEPENDENT_AMBULATORY_CARE_PROVIDER_SITE_OTHER): Payer: Medicare Other | Admitting: General Practice

## 2016-11-15 DIAGNOSIS — E119 Type 2 diabetes mellitus without complications: Secondary | ICD-10-CM

## 2016-11-15 DIAGNOSIS — B351 Tinea unguium: Secondary | ICD-10-CM | POA: Diagnosis not present

## 2016-11-15 DIAGNOSIS — I4891 Unspecified atrial fibrillation: Secondary | ICD-10-CM

## 2016-11-15 DIAGNOSIS — R0989 Other specified symptoms and signs involving the circulatory and respiratory systems: Secondary | ICD-10-CM | POA: Diagnosis not present

## 2016-11-15 DIAGNOSIS — Z5181 Encounter for therapeutic drug level monitoring: Secondary | ICD-10-CM | POA: Diagnosis not present

## 2016-11-15 LAB — POCT INR: INR: 2.4

## 2016-11-15 NOTE — Progress Notes (Signed)
I have reviewed and agree with the plan. 

## 2016-11-15 NOTE — Progress Notes (Signed)
Patient ID: Anita Duke, female   DOB: 08-20-1941, 76 y.o.   MRN: [id number]  Subjective: This diabetic patient presents for follow-up care complaining of thickened and elongated toenails and walking wearing shoes and request toenail debridement Patient is a type II diabetic without history of skin ulceration, claudication or amputation  Objective Orientated 3  Vascular: DP and PT pulses 2/4 bilaterally Capillary reflex immediate bilaterally  Neurological: Sensation to 10 g monofilament wire intact 5/5 bilaterally Vibratory sensation reactive bilaterally Ankle reflex equal and reactive bilaterally  Dermatological: No open skin lesions bilaterally Varicosities ankle and dorsal feet bilaterally The toenails are elongated, incurvated, deformed with texture and color changes with maximum deformity and hallux toenails bilaterally  Musculoskeletal: Bunionette's bilaterally HAV deformity left There is no restriction or crepitus on range of motion of ankle, subtalar, midtarsal joints bilaterally   Assessment: Satisfactory neurovascular status Diabetic without foot complications Mycotic toenails  Plan: Debridement toenails 6-10 mechanically analytic without any bleeding  Reappoint 3 months

## 2016-11-15 NOTE — Patient Instructions (Signed)
Pre visit review using our clinic review tool, if applicable. No additional management support is needed unless otherwise documented below in the visit note. 

## 2016-11-15 NOTE — Patient Instructions (Signed)

## 2016-11-17 DIAGNOSIS — M5136 Other intervertebral disc degeneration, lumbar region: Secondary | ICD-10-CM | POA: Diagnosis not present

## 2016-11-20 ENCOUNTER — Ambulatory Visit (INDEPENDENT_AMBULATORY_CARE_PROVIDER_SITE_OTHER): Payer: Medicare Other

## 2016-11-20 ENCOUNTER — Telehealth: Payer: Self-pay

## 2016-11-20 DIAGNOSIS — I5022 Chronic systolic (congestive) heart failure: Secondary | ICD-10-CM

## 2016-11-20 DIAGNOSIS — Z950 Presence of cardiac pacemaker: Secondary | ICD-10-CM | POA: Diagnosis not present

## 2016-11-20 NOTE — Progress Notes (Signed)
EPIC Encounter for ICM Monitoring  Patient Name: Anita Duke is a 76 y.o. female Date: 11/20/2016 Primary Care Physican: Binnie Rail, MD Primary [place] Electrophysiologist: Allred Dry Weight:unknown Bi-V Pacing: 97%      Attempted call to patient and unable to reach.  Left message to return call.  Transmission reviewed. .   Thoracic impedance abnormal suggesting fluid accumulation x 13 days, starting 11/06/2016 but starting to trend toward baseline.  Prescribed dosage: Furosemide 40 mg 0.5 tablet (20 mg total) daily.  Potassium 20 mEq 3 tablets daily.  Labs: 07/07/2016 Creatinine 0.57, BUN 15, Potassium 3.8, Sodium 142, EGFR 109.46  01/05/2016 Creatinine 0.60, BUN 10, Potassium 4.3, Sodium 142, EGFR 103.31  11/30/2015 Creatinine 0.66, BUN 14, Potassium 3.8, Sodium 140   Recommendations:  NONE - Unable to reach patient   Follow-up plan: ICM clinic phone appointment on 11/30/2016 to recheck fluid levels.  Copy of ICM check sent to primary cardiologist and device physician.   3 month ICM trend: 11/20/2016   1 Year ICM trend:      [name], RN 11/20/2016 8:42 AM

## 2016-11-20 NOTE — Telephone Encounter (Signed)
Remote ICM transmission received.  Attempted patient call and left message to return call.   

## 2016-11-21 DIAGNOSIS — M5136 Other intervertebral disc degeneration, lumbar region: Secondary | ICD-10-CM | POA: Diagnosis not present

## 2016-11-23 DIAGNOSIS — M5136 Other intervertebral disc degeneration, lumbar region: Secondary | ICD-10-CM | POA: Diagnosis not present

## 2016-11-30 ENCOUNTER — Telehealth: Payer: Self-pay | Admitting: Cardiology

## 2016-11-30 ENCOUNTER — Encounter: Payer: Self-pay | Admitting: Gastroenterology

## 2016-11-30 NOTE — Telephone Encounter (Signed)
Spoke with pt and reminded pt of remote transmission that is due today. Pt verbalized understanding and stated that she is not home for the next 3 weeks. She will send a manual transmission at that time.

## 2016-12-07 NOTE — Progress Notes (Signed)
No ICM remote transmission received for 11/30/2016 and next ICM transmission scheduled for 01/10/2017 due to patient will be out of town for next 3 weeks.

## 2016-12-19 ENCOUNTER — Other Ambulatory Visit: Payer: Self-pay | Admitting: Internal Medicine

## 2016-12-20 ENCOUNTER — Ambulatory Visit (INDEPENDENT_AMBULATORY_CARE_PROVIDER_SITE_OTHER): Payer: Medicare Other | Admitting: General Practice

## 2016-12-20 ENCOUNTER — Encounter: Payer: Self-pay | Admitting: Gastroenterology

## 2016-12-20 DIAGNOSIS — Z5181 Encounter for therapeutic drug level monitoring: Secondary | ICD-10-CM | POA: Diagnosis not present

## 2016-12-20 DIAGNOSIS — I4891 Unspecified atrial fibrillation: Secondary | ICD-10-CM

## 2016-12-20 LAB — POCT INR: INR: 1.8

## 2016-12-20 NOTE — Progress Notes (Signed)
I have reviewed and agree with the plan. 

## 2016-12-22 ENCOUNTER — Other Ambulatory Visit: Payer: Self-pay | Admitting: Internal Medicine

## 2016-12-27 ENCOUNTER — Telehealth: Payer: Self-pay | Admitting: General Practice

## 2016-12-27 ENCOUNTER — Ambulatory Visit
Admission: RE | Admit: 2016-12-27 | Discharge: 2016-12-27 | Disposition: A | Discharge status: Home / Self Care | Payer: Medicare Other | Source: Ambulatory Visit | Attending: Internal Medicine | Admitting: Internal Medicine

## 2016-12-27 DIAGNOSIS — Z1231 Encounter for screening mammogram for malignant neoplasm of breast: Secondary | ICD-10-CM | POA: Diagnosis not present

## 2016-12-27 NOTE — Telephone Encounter (Signed)
Patient is having a colonoscopy on 5/2.  Please follow the instructions below.  4/27 - Last dose of coumadin until after procedure.  5/3 - take 7.5 mg of coumadin 5/4 and 5/5 take 5 mg of coumadin 5/6 - Resume current dosage of 2.5 mg daily and 5 mg on Wednesdays.  5/8 - Check INR

## 2017-01-03 ENCOUNTER — Encounter: Payer: Medicare Other | Admitting: Gastroenterology

## 2017-01-03 ENCOUNTER — Ambulatory Visit (AMBULATORY_SURGERY_CENTER): Payer: Medicare Other | Admitting: Gastroenterology

## 2017-01-03 ENCOUNTER — Encounter: Payer: Self-pay | Admitting: Gastroenterology

## 2017-01-03 VITALS — BP 157/72 | HR 70 | Temp 96.6°F | Resp 14 | Ht 63.0 in | Wt 161.0 lb

## 2017-01-03 DIAGNOSIS — D125 Benign neoplasm of sigmoid colon: Secondary | ICD-10-CM | POA: Diagnosis not present

## 2017-01-03 DIAGNOSIS — Z85038 Personal history of other malignant neoplasm of large intestine: Secondary | ICD-10-CM | POA: Diagnosis present

## 2017-01-03 DIAGNOSIS — D123 Benign neoplasm of transverse colon: Secondary | ICD-10-CM

## 2017-01-03 DIAGNOSIS — I2510 Atherosclerotic heart disease of native coronary artery without angina pectoris: Secondary | ICD-10-CM | POA: Diagnosis not present

## 2017-01-03 DIAGNOSIS — D126 Benign neoplasm of colon, unspecified: Secondary | ICD-10-CM | POA: Diagnosis not present

## 2017-01-03 DIAGNOSIS — D124 Benign neoplasm of descending colon: Secondary | ICD-10-CM

## 2017-01-03 DIAGNOSIS — K635 Polyp of colon: Secondary | ICD-10-CM

## 2017-01-03 MED ORDER — SODIUM CHLORIDE 0.9 % IV SOLN: 500.0000 mL | INTRAVENOUS | Status: DC

## 2017-01-03 NOTE — Patient Instructions (Signed)
YOU HAD AN ENDOSCOPIC PROCEDURE TODAY AT [facility]:   Refer to the procedure report that was given to you for any specific questions about what was found during the examination.  If the procedure report does not answer your questions, please call your gastroenterologist to clarify.  If you requested that your care partner not be given the details of your procedure findings, then the procedure report has been included in a sealed envelope for you to review at your convenience later.  YOU SHOULD EXPECT: Some feelings of bloating in the abdomen. Passage of more gas than usual.  Walking can help get rid of the air that was put into your GI tract during the procedure and reduce the bloating. If you had a lower endoscopy (such as a colonoscopy or flexible sigmoidoscopy) you may notice spotting of blood in your stool or on the toilet paper. If you underwent a bowel prep for your procedure, you may not have a normal bowel movement for a few days.  Please Note:  You might notice some irritation and congestion in your nose or some drainage.  This is from the oxygen used during your procedure.  There is no need for concern and it should clear up in a day or so.  SYMPTOMS TO REPORT IMMEDIATELY:   Following lower endoscopy (colonoscopy or flexible sigmoidoscopy):  Excessive amounts of blood in the stool  Significant tenderness or worsening of abdominal pains  Swelling of the abdomen that is new, acute  Fever of 100F or higher  Please read all handouts given to you by your recovery nurse. You may resume your coumadin tomorrow.  For urgent or emergent issues, a gastroenterologist can be reached at any hour by calling [phone].   DIET:  We do recommend a small meal at first, but then you may proceed to your regular diet.  Drink plenty of fluids but you should avoid alcoholic beverages for 24 hours.  ACTIVITY:  You should plan to take it easy for the rest of today and you should NOT  DRIVE or use heavy machinery until tomorrow (because of the sedation medicines used during the test).    FOLLOW UP: Our staff will call the number listed on your records the next business day following your procedure to check on you and address any questions or concerns that you may have regarding the information given to you following your procedure. If we do not reach you, we will leave a message.  However, if you are feeling well and you are not experiencing any problems, there is no need to return our call.  We will assume that you have returned to your regular daily activities without incident.  If any biopsies were taken you will be contacted by phone or by letter within the next 1-3 weeks.  Please call us at [phone] if you have not heard about the biopsies in 3 weeks.    SIGNATURES/CONFIDENTIALITY: You and/or your care partner have signed paperwork which will be entered into your electronic medical record.  These signatures attest to the fact that that the information above on your After Visit Summary has been reviewed and is understood.  Full responsibility of the confidentiality of this discharge information lies with you and/or your care-partner.  Thank you for letting us take care of your healthcare needs today.

## 2017-01-03 NOTE — Progress Notes (Signed)
To recovery, report to Monday, RN, [facility]

## 2017-01-03 NOTE — Progress Notes (Signed)
Pt's states no medical or surgical changes since previsit or office visit. 

## 2017-01-03 NOTE — Op Note (Signed)
[place] Patient Name: Anita Duke Procedure Date: 01/03/2017 8:33 AM MRN: [id number] Endoscopist: Milus Banister , MD Age: 76 Referring MD:  Date of Birth: 1941-04-23 Gender: Female Account #: [id number] Procedure:                Colonoscopy Indications:              Personal history of malignant neoplasm of the                            colon: 1. Colon cancer, T2 N0 M0 cecal                            cancerdiagnosed 4 2014 during colonoscopy with Dr.                            Deatra Ina. She underwent laparoscopic "partial                            colectomy" 4 2014 with Dr. Barry Dienes. Colonoscopy                            01/2014 Dr. Deatra Ina showed essentially normal                            anastomosis, 2 subCM adenomas were also removed. Medicines:                Monitored Anesthesia Care Procedure:                Pre-Anesthesia Assessment:                           - Prior to the procedure, a History and Physical                            was performed, and patient medications and                            allergies were reviewed. The patient's tolerance of                            previous anesthesia was also reviewed. The risks                            and benefits of the procedure and the sedation                            options and risks were discussed with the patient.                            All questions were answered, and informed consent                            was obtained. Prior Anticoagulants: The patient has  taken Coumadin (warfarin), last dose was 5 days                            prior to procedure. ASA Grade Assessment: II - A                            patient with mild systemic disease. After reviewing                            the risks and benefits, the patient was deemed in                            satisfactory condition to undergo the procedure.                           After obtaining informed  consent, the colonoscope                            was passed under direct vision. Throughout the                            procedure, the patient's blood pressure, pulse, and                            oxygen saturations were monitored continuously. The                            Colonoscope was introduced through the anus and                            advanced to the the ileocolonic anastomosis. The                            colonoscopy was performed without difficulty. The                            patient tolerated the procedure well. The quality                            of the bowel preparation was excellent. The rectum                            was photographed. Scope In: 8:36:36 AM Scope Out: 8:48:52 AM Scope Withdrawal Time: 0 hours 9 minutes 26 seconds  Total Procedure Duration: 0 hours 12 minutes 16 seconds  Findings:                 There was evidence of a prior end-to-end                            ileo-colonic anastomosis (right hemicolectomy)                           Six sessile polyps were found in the sigmoid colon,  descending colon and transverse colon. The polyps                            were 2 to 4 mm in size. These polyps were removed                            with a cold snare. Resection and retrieval were                            complete.                           The exam was otherwise without abnormality on                            direct and retroflexion views. Complications:            No immediate complications. Estimated blood loss:                            None. Estimated Blood Loss:     Estimated blood loss: none. Impression:               - End-to-end ileo-colonic anastomosis (right                            hemicolectomy).                           - Six 2 to 4 mm polyps in the sigmoid colon, in the                            descending colon and in the transverse colon,                            removed  with a cold snare. Resected and retrieved.                           - The examination was otherwise normal on direct                            and retroflexion views. Recommendation:           - Patient has a contact number available for                            emergencies. The signs and symptoms of potential                            delayed complications were discussed with the                            patient. Return to normal activities tomorrow.                            Written discharge instructions were  provided to the                            patient.                           - Resume previous diet.                           - Continue present medications.                           You will receive a letter within 2-3 weeks with the                            pathology results and my final recommendations.                           If the polyp(s) is proven to be 'pre-cancerous' on                            pathology, you will need repeat colonoscopy in 3-5                            years. Milus Banister, MD 01/03/2017 8:54:48 AM This report has been signed electronically.

## 2017-01-03 NOTE — Progress Notes (Signed)
Called to room to assist during endoscopic procedure.  Patient ID and intended procedure confirmed with present staff. Received instructions for my participation in the procedure from the performing physician.  

## 2017-01-04 ENCOUNTER — Telehealth: Payer: Self-pay

## 2017-01-04 NOTE — Telephone Encounter (Signed)
  Follow up Call-  Call back number 01/03/2017  Post procedure Call Back phone  # [phone]  Permission to leave phone message Yes  Some recent data might be hidden     Patient questions:  Do you have a fever, pain , or abdominal swelling? No. Pain Score  0 *  Have you tolerated food without any problems? Yes.    Have you been able to return to your normal activities? Yes.    Do you have any questions about your discharge instructions: Diet   No. Medications  No. Follow up visit  No.  Do you have questions or concerns about your Care? No.  Actions: * If pain score is 4 or above: No action needed, pain <4.

## 2017-01-09 ENCOUNTER — Encounter: Payer: Self-pay | Admitting: Internal Medicine

## 2017-01-09 ENCOUNTER — Encounter: Payer: Self-pay | Admitting: Gastroenterology

## 2017-01-09 ENCOUNTER — Ambulatory Visit (INDEPENDENT_AMBULATORY_CARE_PROVIDER_SITE_OTHER): Payer: Medicare Other | Admitting: General Practice

## 2017-01-09 ENCOUNTER — Ambulatory Visit (INDEPENDENT_AMBULATORY_CARE_PROVIDER_SITE_OTHER): Payer: Medicare Other | Admitting: Internal Medicine

## 2017-01-09 ENCOUNTER — Other Ambulatory Visit (INDEPENDENT_AMBULATORY_CARE_PROVIDER_SITE_OTHER): Payer: Medicare Other

## 2017-01-09 VITALS — BP 124/70 | HR 89 | Temp 97.9°F | Resp 16 | Wt 156.0 lb

## 2017-01-09 DIAGNOSIS — I4891 Unspecified atrial fibrillation: Secondary | ICD-10-CM

## 2017-01-09 DIAGNOSIS — I10 Essential (primary) hypertension: Secondary | ICD-10-CM

## 2017-01-09 DIAGNOSIS — E782 Mixed hyperlipidemia: Secondary | ICD-10-CM

## 2017-01-09 DIAGNOSIS — E1159 Type 2 diabetes mellitus with other circulatory complications: Secondary | ICD-10-CM

## 2017-01-09 DIAGNOSIS — Z5181 Encounter for therapeutic drug level monitoring: Secondary | ICD-10-CM | POA: Diagnosis not present

## 2017-01-09 LAB — LIPID PANEL
Cholesterol: 154 mg/dL (ref 0–200)
HDL: 60.9 mg/dL (ref >39.00)
LDL CALC: 71 mg/dL (ref 0–99)
NONHDL: 93.59
Total CHOL/HDL Ratio: 3
Triglycerides: 115 mg/dL (ref 0.0–149.0)
VLDL: 23 mg/dL (ref 0.0–40.0)

## 2017-01-09 LAB — COMPREHENSIVE METABOLIC PANEL
ALBUMIN: 4.7 g/dL (ref 3.5–5.2)
ALT: 14 U/L (ref 0–35)
AST: 17 U/L (ref 0–37)
Alkaline Phosphatase: 85 U/L (ref 39–117)
BUN: 19 mg/dL (ref 6–23)
CHLORIDE: 100 meq/L (ref 96–112)
CO2: 32 mEq/L (ref 19–32)
Calcium: 10 mg/dL (ref 8.4–10.5)
Creatinine, Ser: 0.62 mg/dL (ref 0.40–1.20)
GFR: 99.21 mL/min (ref >60.00)
GLUCOSE: 137 mg/dL — AB (ref 70–99)
POTASSIUM: 4.7 meq/L (ref 3.5–5.1)
SODIUM: 139 meq/L (ref 135–145)
Total Bilirubin: 0.8 mg/dL (ref 0.2–1.2)
Total Protein: 7.7 g/dL (ref 6.0–8.3)

## 2017-01-09 LAB — POCT INR: INR: 1.3

## 2017-01-09 LAB — HEMOGLOBIN A1C: HEMOGLOBIN A1C: 7.6 % — AB (ref 4.6–6.5)

## 2017-01-09 NOTE — Assessment & Plan Note (Signed)
Check a1c Low sugar / carb diet Stressed regular exercise, keeping weight down  

## 2017-01-09 NOTE — Patient Instructions (Addendum)
  Test(s) ordered today. Your results will be released to MyChart (or called to you) after review, usually within 72hours after test completion. If any changes need to be made, you will be notified at that same time.  Medications reviewed and updated.  No changes recommended at this time.    Please followup in 6 months   

## 2017-01-09 NOTE — Assessment & Plan Note (Signed)
Rate controlled, in sinus on exam Asymptomatic Continue current medications

## 2017-01-09 NOTE — Assessment & Plan Note (Signed)
Check lipid panel  Continue daily statin Regular exercise and healthy diet encouraged  

## 2017-01-09 NOTE — Patient Instructions (Signed)
Pre visit review using our clinic review tool, if applicable. No additional management support is needed unless otherwise documented below in the visit note. 

## 2017-01-09 NOTE — Progress Notes (Signed)
I have reviewed and agree with the plan. 

## 2017-01-09 NOTE — Progress Notes (Signed)
Subjective:    Patient ID: Anita Duke, female    DOB: [birth year], 76 y.o.   MRN: [id number]  HPI The patient is here for follow up.  Her husband died from lung cancer 09/18/2016.  She is not sleeping well.  Her appetite is better.  She did lose some weight.  She feels she is doing well overall.  She denies depression and anxiety, just grief.   CAD, Afib, Hypertension: She is taking her medication daily. She is compliant with a low sodium diet.  She denies chest pain, palpitations, edema, shortness of breath and regular headaches. She is exercising  - walking the dog some.    Hyperlipidemia: She is taking her medication daily. She is compliant with a low fat/cholesterol diet. She is exercising regularly. She denies myalgias.   Diabetes: She is taking her medication daily as prescribed. She is compliant with a diabetic diet. She is exercising  - walking the dog. She monitors her sugars and they have been running 156 at the highest    Medications and allergies reviewed with patient and updated if appropriate.  Patient Active Problem List   Diagnosis Date Noted  . Excessive sweating 07/07/2016  . Osteopenia 01/10/2016  . Encounter for therapeutic drug monitoring 10/08/2015  . Heat intolerance 10/08/2015  . Obesity 12/02/2014  . CAD (coronary artery disease) 05/28/2014  . Vitamin D deficiency 02/20/2014  . Long term current use of anticoagulant therapy 11/24/2013  . Chronic systolic dysfunction of left ventricle 05/21/2013  . 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 Acquired complete AV block   . Sick sinus syndrome ([place])   . Pacemaker-Medtronic 05/20/2012  . Allergic rhinitis 04/25/2012  . Mild intermittent asthma 02/16/2011  . Ejection fraction < 50%   . Atrial fibrillation ([place])   . Mitral regurgitation   . Right ventricular dysfunction   . Mitral stenosis   . S/P mitral valve repair   . Hx of CABG   . Diabetes type II 03/02/2009  . Hyperlipidemia  07/07/2008  . Essential hypertension 07/07/2008  . Diverticulosis of large intestine 08/04/2002    Current Outpatient Prescriptions on File Prior to Visit  Medication Sig Dispense Refill  . ACCU-CHEK AVIVA PLUS test strip USE 2 TIMES DAILY AS  DIRECTED 200 each 2  . ACCU-CHEK SOFTCLIX LANCETS lancets USE TO CHECK BLOOD SUGARS  TWICE A DAY 200 each 2  . atorvastatin (LIPITOR) 40 MG tablet Take 1 tablet (40 mg total) by mouth daily. 90 tablet 3  . blood glucose meter kit and supplies KIT Dispense based on patient and insurance preference. Check sugars daily. NIDDM 1 each 0  . Blood Glucose Monitoring Suppl (ACCU-CHEK AVIVA PLUS) w/Device KIT Use to check blood sugars daily Dx E11.9 1 kit 0  . calcium carbonate (OS-CAL) 600 MG TABS Take 600 mg by mouth daily.     . carvedilol (COREG) 6.25 MG tablet TAKE 1 TABLET BY MOUTH  TWICE A DAY WITH MEALS 180 tablet 1  . cholecalciferol (VITAMIN D) 1000 UNITS tablet Take 6,000 Units by mouth daily.     . fish oil-omega-3 fatty acids 1000 MG capsule Take 1 g by mouth 2 (two) times daily.     . furosemide (LASIX) 40 MG tablet TAKE ONE-HALF TABLET BY  MOUTH DAILY 45 tablet 2  . JARDIANCE 10 MG TABS tablet TAKE 1 TABLET BY MOUTH  DAILY 90 tablet 0  . loperamide (IMODIUM) 2 MG capsule Take 1 capsule (2 mg  total) by mouth daily as needed for diarrhea or loose stools. 30 capsule 10  . loratadine (CLARITIN) 10 MG tablet Take 10 mg by mouth daily.    . potassium chloride SA (K-DUR,KLOR-CON) 20 MEQ tablet Take 3 tablets by mouth  daily 270 tablet 2  . trandolapril (MAVIK) 4 MG tablet TAKE 1 TABLET BY MOUTH TWO  TIMES DAILY FOR BLOOD  PRESSURE 180 tablet 1  . venlafaxine XR (EFFEXOR-XR) 37.5 MG 24 hr capsule TAKE 1 CAPSULE BY MOUTH  EVERY EVENING 90 capsule 1  . warfarin (COUMADIN) 2.5 MG tablet TAKE AS DIRECTED BY  ANTICOAGULATION CLINIC 100 tablet 1   No current facility-administered medications on file prior to visit.     Past Medical History:  Diagnosis Date   . Anemia    Hemoglobin 10.4, December, 2013  . Arthritis    "not bad; little in my hands; some in my knees" (11/04/2013)  . Asthma   . CAD (coronary artery disease)    LIMA to the LAD at time of mitral valve repair / LIMA atretic,, February, 2011  . Cataracts, bilateral   . Cecal cancer ([place]) 2014  . CHF (congestive heart failure) ([place])   . Colon polyp, hyperplastic   . COPD (chronic obstructive pulmonary disease) ([place])   . Depression   . Diverticulosis of colon   . GERD (gastroesophageal reflux disease)   . Hemorrhoids, internal   . High cholesterol   . HTN (hypertension)   . Mitral regurgitation    a. s/p repair with LAA ligation and MAZE at time of surgery  . Permanent atrial fibrillation ([facility])    a. s/p MAZE b. s/p AVN ablation  . Pulmonary HTN ([place])   . QT prolongation    Tikosyn and Effexor. QT prolonged October 13, 2011, peak is in dose reduced from 500  to -250 twice a day  . Type II diabetes mellitus ([place])     Past Surgical History:  Procedure Laterality Date  . ABDOMINAL HYSTERECTOMY  ?2002  . AV NODE ABLATION N/A 05/28/2012   Procedure: AV NODE ABLATION;  Surgeon: Thompson Grayer, MD;  Location: [facility] CATH LAB;  Service: Cardiovascular;  Laterality: N/A;  . BI-VENTRICULAR PACEMAKER UPGRADE N/A 11/04/2013   upgrade of previously implanted dual chamber pacemaker to STJ Quadra Allure CRTP by Dr Rayann Heman  . BREAST LUMPECTOMY Bilateral 1990's  . CARDIOVERSION  02/08/2012   Procedure: CARDIOVERSION;  Surgeon: Carlena Bjornstad, MD;  Location: [place];  Service: Cardiovascular;  Laterality: N/A;  . CARDIOVERSION  02/29/2012   Procedure: CARDIOVERSION;  Surgeon: Carlena Bjornstad, MD;  Location: [place];  Service: Cardiovascular;  Laterality: N/A;  . CARDIOVERSION  05/15/2012   Procedure: CARDIOVERSION;  Surgeon: Carlena Bjornstad, MD;  Location: [place];  Service: Cardiovascular;  Laterality: N/A;  . CATARACT EXTRACTION, BILATERAL  2017  . CHOLECYSTECTOMY  1995  . CORONARY ARTERY BYPASS  GRAFT  2001   CABG X1 "at time of mitral valve repair" (12/04/2012  . DIAGNOSTIC LAPAROSCOPIC LIVER BIOPSY Left 12/03/2012   Procedure: DIAGNOSTIC LAPAROSCOPIC LIVER BIOPSY;  Surgeon: Stark Klein, MD;  Location: [place];  Service: General;  Laterality: Left;  . LAPAROSCOPIC RIGHT HEMI COLECTOMY  12/03/2012   Procedure: LAPAROSCOPIC RIGHT HEMI COLECTOMY;  Surgeon: Stark Klein, MD;  Location: [place];  Service: General;;  . MAZE  2001   w/ MVR & CABG  . MITRAL VALVE REPAIR  2001   "anterior and posterior leaflets" (12/04/2012)  . PERMANENT PACEMAKER INSERTION N/A 05/17/2012  MDT Adapta L implanted by Dr Rayann Heman for tachy/brady syndrome  . TONSILLECTOMY AND ADENOIDECTOMY  ~ 1950  . UMBILICAL HERNIA REPAIR N/A 12/03/2012   Procedure: HERNIA REPAIR UMBILICAL ADULT;  Surgeon: Stark Klein, MD;  Location: [place];  Service: General;  Laterality: N/A;    Social History   Social History  . Marital status: Married    Spouse name: N/A  . Number of children: 0  . 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  [place] History Main Topics  . Smoking status: Never Smoker  . Smokeless tobacco: Never Used  . Alcohol use No  . Drug use: No  . Sexual activity: No   Other Topics Concern  . None   Social History Narrative   Does not et regular exercise   Daily caffeine use          Family History  Problem Relation Age of Onset  . Heart disease Father   . Diabetes Father   . Heart disease Sister     x 2  . Diabetes Mother   . Kidney disease Mother   . Thyroid disease Mother   . Pancreatic cancer Maternal Grandfather   . Diabetes Paternal Grandfather   . Diabetes Paternal Grandmother   . Heart disease Paternal Uncle     x 6  . Prostate cancer Paternal Uncle   . Colon cancer Neg Hx   . Colon polyps Neg Hx   . Adrenal disorder Neg Hx     Review of Systems  Constitutional: Negative for fever.  Respiratory:  Negative for cough, shortness of breath and wheezing.   Cardiovascular: Negative for chest pain, palpitations and leg swelling.  Neurological: Negative for light-headedness and headaches.       Objective:   Vitals:   01/09/17 1106  BP: 124/70  Pulse: 89  Resp: 16  Temp: 97.9 F (36.6 C)   Wt Readings from Last 3 Encounters:  01/09/17 156 lb (70.8 kg)  01/03/17 161 lb (73 kg)  11/07/16 161 lb (73 kg)   Body mass index is 27.63 kg/m.   Physical Exam    Constitutional: Appears well-developed and well-nourished. No distress.  HENT:  Head: Normocephalic and atraumatic.  Neck: Neck supple. No tracheal deviation present. No thyromegaly present.  No cervical lymphadenopathy Cardiovascular: Normal rate, regular rhythm and normal heart sounds.   No murmur heard. No carotid bruit .  No edema Pulmonary/Chest: Effort normal and breath sounds normal. No respiratory distress. No has no wheezes. No rales.  Skin: Skin is warm and dry. Not diaphoretic.  Psychiatric: Normal mood and affect. Behavior is normal.      Assessment & Plan:    See Problem List for Assessment and Plan of chronic medical problems.

## 2017-01-09 NOTE — Assessment & Plan Note (Signed)
BP well controlled Current regimen effective and well tolerated Continue current medications at current doses cmp  

## 2017-01-09 NOTE — Progress Notes (Signed)
Pre visit review using our clinic review tool, if applicable. No additional management support is needed unless otherwise documented below in the visit note. 

## 2017-01-10 ENCOUNTER — Ambulatory Visit (INDEPENDENT_AMBULATORY_CARE_PROVIDER_SITE_OTHER): Payer: Medicare Other | Admitting: *Deleted

## 2017-01-10 ENCOUNTER — Encounter: Payer: Self-pay | Admitting: Internal Medicine

## 2017-01-10 DIAGNOSIS — Z950 Presence of cardiac pacemaker: Secondary | ICD-10-CM | POA: Diagnosis not present

## 2017-01-10 DIAGNOSIS — I495 Sick sinus syndrome: Secondary | ICD-10-CM | POA: Diagnosis not present

## 2017-01-10 DIAGNOSIS — I5022 Chronic systolic (congestive) heart failure: Secondary | ICD-10-CM | POA: Diagnosis not present

## 2017-01-10 NOTE — Progress Notes (Signed)
Remote pacemaker transmission.   

## 2017-01-11 ENCOUNTER — Telehealth: Payer: Self-pay

## 2017-01-11 ENCOUNTER — Encounter: Payer: Self-pay | Admitting: Internal Medicine

## 2017-01-11 NOTE — Telephone Encounter (Signed)
Remote ICM transmission received.  Attempted patient call and left detailed message regarding transmission and next ICM scheduled for 03/08/2017.  Advised to return call for any fluid symptoms or questions.

## 2017-01-11 NOTE — Progress Notes (Signed)
EPIC Encounter for ICM Monitoring  Patient Name: Anita Duke is a 76 y.o. female Date: 01/11/2017 Primary Care Physican: Binnie Rail, MD Primary [place] Electrophysiologist: Allred Dry Weight:unknown Bi-V Pacing: 97%      Attempted call to patient and unable to reach.  Left detailed message regarding transmission.  Transmission reviewed.    Thoracic impedance normal but was abnormal suggesting fluid accumulation from 12/16/2016 to 12/26/2016.   Prescribed dosage: Furosemide 40 mg 0.5 tablet (20 mg total) daily.  Potassium 20 mEq 3 tablets daily.  Labs: 01/09/2017 Creatinine 0.62, BUN 19, Potassium 4.7, Sodium 139 07/07/2016 Creatinine 0.57, BUN 15, Potassium 3.8, Sodium 142, EGFR 109.46  01/05/2016 Creatinine 0.60, BUN 10, Potassium 4.3, Sodium 142, EGFR 103.31  11/30/2015 Creatinine 0.66, BUN 14, Potassium 3.8, Sodium 140   Recommendations: Left voice mail with ICM number and encouraged to call for fluid symptoms.  Follow-up plan: ICM clinic phone appointment on 03/08/2017.  Office appointment scheduled on 02/05/2017 with Dr Rayann Heman.  Copy of ICM check sent to device physician.   3 month ICM trend: 01/10/2017   1 Year ICM trend:      [name], RN 01/11/2017 3:15 PM

## 2017-01-12 ENCOUNTER — Encounter: Payer: Self-pay | Admitting: Cardiology

## 2017-01-12 LAB — CUP PACEART REMOTE DEVICE CHECK
Battery Remaining Longevity: 95 mo
Battery Remaining Percentage: 95.5 %
Battery Voltage: 2.98 V
Implantable Lead Implant Date: 20130913
Implantable Lead Location: 753860
Implantable Lead Model: 5076
Implantable Lead Model: 5092
Lead Channel Impedance Value: 440 Ohm
Lead Channel Impedance Value: 490 Ohm
Lead Channel Pacing Threshold Amplitude: 1.25 V
Lead Channel Pacing Threshold Pulse Width: 0.4 ms
Lead Channel Setting Pacing Amplitude: 2.25 V
Lead Channel Setting Pacing Pulse Width: 0.4 ms
MDC IDC LEAD IMPLANT DT: 20130913
MDC IDC LEAD IMPLANT DT: 20150303
MDC IDC LEAD LOCATION: 753858
MDC IDC LEAD LOCATION: 753859
MDC IDC MSMT LEADCHNL LV PACING THRESHOLD PULSEWIDTH: 0.6 ms
MDC IDC MSMT LEADCHNL RV PACING THRESHOLD AMPLITUDE: 0.5 V
MDC IDC MSMT LEADCHNL RV SENSING INTR AMPL: 8.2 mV
MDC IDC PG IMPLANT DT: 20150303
MDC IDC PG SERIAL: 7548835
MDC IDC SESS DTM: 20180509060013
MDC IDC SET LEADCHNL LV PACING PULSEWIDTH: 0.6 ms
MDC IDC SET LEADCHNL RV PACING AMPLITUDE: 2 V
MDC IDC SET LEADCHNL RV SENSING SENSITIVITY: 3 mV

## 2017-01-12 NOTE — Progress Notes (Signed)
Agree with plan 

## 2017-01-17 ENCOUNTER — Ambulatory Visit: Payer: Medicare Other

## 2017-01-18 ENCOUNTER — Other Ambulatory Visit: Payer: Self-pay | Admitting: Emergency Medicine

## 2017-01-18 MED ORDER — VENLAFAXINE HCL ER 37.5 MG PO CP24: 37.5000 mg | ORAL_CAPSULE | Freq: Every evening | ORAL | 1 refills | Status: DC

## 2017-01-19 ENCOUNTER — Ambulatory Visit: Payer: Medicare Other

## 2017-01-19 ENCOUNTER — Ambulatory Visit (INDEPENDENT_AMBULATORY_CARE_PROVIDER_SITE_OTHER): Payer: Medicare Other | Admitting: General Practice

## 2017-01-19 DIAGNOSIS — Z5181 Encounter for therapeutic drug level monitoring: Secondary | ICD-10-CM | POA: Diagnosis not present

## 2017-01-19 DIAGNOSIS — I4891 Unspecified atrial fibrillation: Secondary | ICD-10-CM

## 2017-01-19 LAB — POCT INR: INR: 2.3

## 2017-01-19 NOTE — Progress Notes (Signed)
I have reviewed and agree with the plan. 

## 2017-01-19 NOTE — Patient Instructions (Signed)
Pre visit review using our clinic review tool, if applicable. No additional management support is needed unless otherwise documented below in the visit note. 

## 2017-01-26 ENCOUNTER — Encounter: Payer: Self-pay | Admitting: Cardiology

## 2017-02-05 ENCOUNTER — Ambulatory Visit (INDEPENDENT_AMBULATORY_CARE_PROVIDER_SITE_OTHER): Payer: Medicare Other | Admitting: Internal Medicine

## 2017-02-05 ENCOUNTER — Encounter: Payer: Self-pay | Admitting: Internal Medicine

## 2017-02-05 VITALS — BP 136/72 | HR 74 | Ht 63.0 in | Wt 160.0 lb

## 2017-02-05 DIAGNOSIS — I442 Atrioventricular block, complete: Secondary | ICD-10-CM

## 2017-02-05 DIAGNOSIS — Z950 Presence of cardiac pacemaker: Secondary | ICD-10-CM | POA: Diagnosis not present

## 2017-02-05 DIAGNOSIS — I5022 Chronic systolic (congestive) heart failure: Secondary | ICD-10-CM

## 2017-02-05 DIAGNOSIS — I482 Chronic atrial fibrillation: Secondary | ICD-10-CM

## 2017-02-05 DIAGNOSIS — I4821 Permanent atrial fibrillation: Secondary | ICD-10-CM

## 2017-02-05 NOTE — Progress Notes (Signed)
 PCP: Burns, Stacy J, MD Primary Cardiologist:  Dr Varanasi  Anita Duke is a 76 y.o. female who presents today for routine electrophysiology followup.  Since last being seen in our clinic, the patient reports doing reasonably well.  She is staying active and recently returned from the beach.  She lost her husband since I saw her last and has found this adjustment to be hard.  Today, she denies symptoms of palpitations, chest pain, shortness of breath,  lower extremity edema, dizziness, presyncope, or syncope.  The patient is otherwise without complaint today.   Past Medical History:  Diagnosis Date  . Anemia    Hemoglobin 10.4, December, 2013  . Arthritis    "not bad; little in my hands; some in my knees" (11/04/2013)  . Asthma   . CAD (coronary artery disease)    LIMA to the LAD at time of mitral valve repair / LIMA atretic,, February, 2011  . Cataracts, bilateral   . Cecal cancer (H[facility]) 2014  . CHF (congestive heart failure) (H[facility])   . Colon polyp, hyperplastic   . COPD (chronic obstructive pulmonary disease) (H[facility])   . Depression   . Diverticulosis of colon   . GERD (gastroesophageal reflux disease)   . Hemorrhoids, internal   . High cholesterol   . HTN (hypertension)   . Mitral regurgitation    a. s/p repair with LAA ligation and MAZE at time of surgery  . Permanent atrial fibrillation (H[facility])    a. s/p MAZE b. s/p AVN ablation  . Pulmonary HTN ([facility])   . QT prolongation    Tikosyn and Effexor. QT prolonged October 13, 2011, peak is in dose reduced from 500  to -250 twice a day  . Type II diabetes mellitus ([facility])    Past Surgical History:  Procedure Laterality Date  . ABDOMINAL HYSTERECTOMY  ?2002  . AV NODE ABLATION N/A 05/28/2012   Procedure: AV NODE ABLATION;  Surgeon: James Allred, MD;  Location: [facility] CATH LAB;  Service: Cardiovascular;  Laterality: N/A;  . BI-VENTRICULAR PACEMAKER UPGRADE N/A 11/04/2013   upgrade of previously implanted dual chamber pacemaker to STJ Quadra  Allure CRTP by Dr Allred  . BREAST LUMPECTOMY Bilateral 1990's  . CARDIOVERSION  02/08/2012   Procedure: CARDIOVERSION;  Surgeon: Jeffrey D Katz, MD;  Location: [facility] OR;  Service: Cardiovascular;  Laterality: N/A;  . CARDIOVERSION  02/29/2012   Procedure: CARDIOVERSION;  Surgeon: Jeffrey D Katz, MD;  Location: [facility] OR;  Service: Cardiovascular;  Laterality: N/A;  . CARDIOVERSION  05/15/2012   Procedure: CARDIOVERSION;  Surgeon: Jeffrey D Katz, MD;  Location: [facility] ENDOSCOPY;  Service: Cardiovascular;  Laterality: N/A;  . CATARACT EXTRACTION, BILATERAL  2017  . CHOLECYSTECTOMY  1995  . CORONARY ARTERY BYPASS GRAFT  2001   CABG X1 "at time of mitral valve repair" (12/04/2012  . DIAGNOSTIC LAPAROSCOPIC LIVER BIOPSY Left 12/03/2012   Procedure: DIAGNOSTIC LAPAROSCOPIC LIVER BIOPSY;  Surgeon: Faera Byerly, MD;  Location: [facility] OR;  Service: General;  Laterality: Left;  . LAPAROSCOPIC RIGHT HEMI COLECTOMY  12/03/2012   Procedure: LAPAROSCOPIC RIGHT HEMI COLECTOMY;  Surgeon: Faera Byerly, MD;  Location: [facility] OR;  Service: General;;  . MAZE  2001   w/ MVR & CABG  . MITRAL VALVE REPAIR  2001   "anterior and posterior leaflets" (12/04/2012)  . PERMANENT PACEMAKER INSERTION N/A 05/17/2012   MDT Adapta L implanted by Dr Allred for tachy/brady syndrome  . TONSILLECTOMY AND ADENOIDECTOMY  ~ 1950  . UMBILICAL HERNIA REPAIR N/A   12/03/2012   Procedure: HERNIA REPAIR UMBILICAL ADULT;  Surgeon: Stark Klein, MD;  Location: [place];  Service: General;  Laterality: N/A;    ROS- all systems are reviewed and negative except as per HPI above  Current Outpatient Prescriptions  Medication Sig Dispense Refill  . ACCU-CHEK AVIVA PLUS test strip USE 2 TIMES DAILY AS  DIRECTED 200 each 2  . ACCU-CHEK SOFTCLIX LANCETS lancets USE TO CHECK BLOOD SUGARS  TWICE A DAY 200 each 2  . atorvastatin (LIPITOR) 40 MG tablet Take 1 tablet (40 mg total) by mouth daily. 90 tablet 3  . blood glucose meter kit and supplies KIT Dispense based on patient and  insurance preference. Check sugars daily. NIDDM 1 each 0  . Blood Glucose Monitoring Suppl (ACCU-CHEK AVIVA PLUS) w/Device KIT Use to check blood sugars daily Dx E11.9 1 kit 0  . calcium carbonate (OS-CAL) 600 MG TABS Take 600 mg by mouth daily.     . carvedilol (COREG) 6.25 MG tablet TAKE 1 TABLET BY MOUTH  TWICE A DAY WITH MEALS 180 tablet 1  . cholecalciferol (VITAMIN D) 1000 UNITS tablet Take 6,000 Units by mouth daily.     . fish oil-omega-3 fatty acids 1000 MG capsule Take 1 g by mouth 2 (two) times daily.     . furosemide (LASIX) 40 MG tablet TAKE ONE-HALF TABLET BY  MOUTH DAILY 45 tablet 2  . JARDIANCE 10 MG TABS tablet TAKE 1 TABLET BY MOUTH  DAILY 90 tablet 0  . loperamide (IMODIUM) 2 MG capsule Take 1 capsule (2 mg total) by mouth daily as needed for diarrhea or loose stools. 30 capsule 10  . loratadine (CLARITIN) 10 MG tablet Take 10 mg by mouth daily.    . potassium chloride SA (K-DUR,KLOR-CON) 20 MEQ tablet Take 3 tablets by mouth  daily 270 tablet 2  . trandolapril (MAVIK) 4 MG tablet TAKE 1 TABLET BY MOUTH TWO  TIMES DAILY FOR BLOOD  PRESSURE 180 tablet 1  . venlafaxine XR (EFFEXOR-XR) 37.5 MG 24 hr capsule Take 1 capsule (37.5 mg total) by mouth every evening. 90 capsule 1  . warfarin (COUMADIN) 2.5 MG tablet TAKE AS DIRECTED BY  ANTICOAGULATION CLINIC 100 tablet 1   No current facility-administered medications for this visit.     Physical Exam: Vitals:   02/05/17 1427  BP: 136/72  Pulse: 74  SpO2: 97%  Weight: 160 lb (72.6 kg)  Height: 5' 3" (1.6 m)    GEN- The patient is well appearing, alert and oriented x 3 today.   Head- normocephalic, atraumatic Eyes-  Sclera clear, conjunctiva pink Ears- hearing intact Oropharynx- clear Lungs- Clear to ausculation bilaterally, normal work of breathing Chest- pacemaker pocket is well healed Heart- Regular rate and rhythm (paced) GI- soft, NT, ND, + BS Extremities- no clubbing, cyanosis, or edema  Pacemaker interrogation-  reviewed in detail today,  See PACEART report  Assessment and Plan:  1. Complete heart block Normal pacemaker function See Pace Art report No changes today  2. Chronic systolic dysfunction Stable No change required today euvolemic Followed by Sharman Cheek in [facility] clinic  3. Permanent afib Continue long term anticoagulation  4. CAD No ischemic symptoms No changes today  [place] Return to see EP NP in 1 year  Thompson Grayer MD, [facility] 02/05/2017 3:13 PM

## 2017-02-05 NOTE — Patient Instructions (Addendum)
Medication Instructions:  Your physician recommends that you continue on your current medications as directed. Please refer to the Current Medication list given to you today.    Labwork: None ordered   Testing/Procedures: None ordered   Follow-Up: Remote monitoring is used to monitor your Pacemaker from home. This monitoring reduces the number of office visits required to check your device to one time per year. It allows [name] to keep an eye on the functioning of your device to ensure it is working properly. You are scheduled for a device check from home on  05/07/17 You may send your transmission at any time that day. If you have a wireless device, the transmission will be sent automatically. After your physician reviews your transmission, you will receive a postcard with your next transmission date.   Your physician wants you to follow-up in: 12 months with Chanetta Marshall, NP You will receive a reminder letter in the mail two months in advance. If you don't receive a letter, please call our office to schedule the follow-up appointment.     Any Other Special Instructions Will Be Listed Below (If Applicable).     If you need a refill on your cardiac medications before your next appointment, please call your pharmacy.

## 2017-02-08 LAB — CUP PACEART INCLINIC DEVICE CHECK
Battery Remaining Longevity: 87 mo
Battery Voltage: 2.98 V
Brady Statistic RA Percent Paced: 0 %
Implantable Lead Implant Date: 20150303
Implantable Lead Location: 753859
Implantable Lead Location: 753860
Implantable Lead Model: 5076
Implantable Lead Model: 5092
Lead Channel Impedance Value: 475 Ohm
Lead Channel Pacing Threshold Pulse Width: 0.4 ms
Lead Channel Pacing Threshold Pulse Width: 0.6 ms
Lead Channel Setting Pacing Amplitude: 2.375
Lead Channel Setting Pacing Pulse Width: 0.4 ms
Lead Channel Setting Pacing Pulse Width: 0.6 ms
MDC IDC LEAD IMPLANT DT: 20130913
MDC IDC LEAD IMPLANT DT: 20130913
MDC IDC LEAD LOCATION: 753858
MDC IDC MSMT LEADCHNL LV IMPEDANCE VALUE: 412.5 Ohm
MDC IDC MSMT LEADCHNL LV PACING THRESHOLD AMPLITUDE: 1.5 V
MDC IDC MSMT LEADCHNL RV PACING THRESHOLD AMPLITUDE: 0.75 V
MDC IDC MSMT LEADCHNL RV SENSING INTR AMPL: 8.4 mV
MDC IDC PG IMPLANT DT: 20150303
MDC IDC SESS DTM: 20180604182848
MDC IDC SET LEADCHNL RV PACING AMPLITUDE: 2 V
MDC IDC SET LEADCHNL RV SENSING SENSITIVITY: 3 mV
MDC IDC STAT BRADY RV PERCENT PACED: 97 %
Pulse Gen Serial Number: 7548835

## 2017-02-16 ENCOUNTER — Ambulatory Visit (INDEPENDENT_AMBULATORY_CARE_PROVIDER_SITE_OTHER): Payer: Medicare Other | Admitting: General Practice

## 2017-02-16 DIAGNOSIS — I4891 Unspecified atrial fibrillation: Secondary | ICD-10-CM

## 2017-02-16 DIAGNOSIS — Z5181 Encounter for therapeutic drug level monitoring: Secondary | ICD-10-CM

## 2017-02-16 LAB — POCT INR: INR: 2.3

## 2017-02-16 NOTE — Patient Instructions (Signed)
Pre visit review using our clinic review tool, if applicable. No additional management support is needed unless otherwise documented below in the visit note. 

## 2017-02-16 NOTE — Progress Notes (Signed)
Agree with management.  [name], MD  

## 2017-02-20 ENCOUNTER — Other Ambulatory Visit: Payer: Self-pay | Admitting: Internal Medicine

## 2017-02-20 DIAGNOSIS — H26493 Other secondary cataract, bilateral: Secondary | ICD-10-CM | POA: Diagnosis not present

## 2017-02-20 DIAGNOSIS — E119 Type 2 diabetes mellitus without complications: Secondary | ICD-10-CM | POA: Diagnosis not present

## 2017-02-20 DIAGNOSIS — H5211 Myopia, right eye: Secondary | ICD-10-CM | POA: Diagnosis not present

## 2017-02-20 LAB — HM DIABETES EYE EXAM

## 2017-02-21 ENCOUNTER — Encounter: Payer: Self-pay | Admitting: Podiatry

## 2017-02-21 ENCOUNTER — Ambulatory Visit (INDEPENDENT_AMBULATORY_CARE_PROVIDER_SITE_OTHER): Payer: Medicare Other | Admitting: Podiatry

## 2017-02-21 DIAGNOSIS — B351 Tinea unguium: Secondary | ICD-10-CM | POA: Diagnosis not present

## 2017-02-21 DIAGNOSIS — E119 Type 2 diabetes mellitus without complications: Secondary | ICD-10-CM | POA: Diagnosis not present

## 2017-02-21 NOTE — Patient Instructions (Signed)

## 2017-02-22 ENCOUNTER — Encounter: Payer: Self-pay | Admitting: Internal Medicine

## 2017-02-22 NOTE — Progress Notes (Signed)
Patient ID: Anita Duke, female   DOB: 06/05/1941, 76 y.o.   MRN: [id number]     Subjective: This diabetic patient presents for follow-up care complaining of thickened and elongated toenails and walking wearing shoes and request toenail debridement Patient is a type II diabetic without history of skin ulceration, claudication or amputation  Objective Orientated 3  Vascular: DP and PT pulses 2/4 bilaterally Capillary reflex immediate bilaterally  Neurological: Sensation to 10 g monofilament wire intact 5/5 bilaterally Vibratory sensation reactive bilaterally Ankle reflex equal and reactive bilaterally  Dermatological: No open skin lesions bilaterally Absent hair growth bilaterally Dry small plantar callus left first MPJ Varicosities ankle and dorsal feet bilaterally The toenails are elongated, incurvated, deformed with texture and color changes with maximum deformity and hallux toenails bilaterally  Musculoskeletal: Bunionette's bilaterally HAV deformity left There is no restriction or crepitus on range of motion of ankle, subtalar, midtarsal joints bilaterally   Assessment: Satisfactory neurovascular status Diabetic without foot complications Mycotic toenails  Plan: Debridement toenails 6-10 mechanically and electrically without any bleeding  Reappoint 3 months

## 2017-02-23 ENCOUNTER — Other Ambulatory Visit: Payer: Self-pay | Admitting: Internal Medicine

## 2017-03-02 DIAGNOSIS — M7130 Other bursal cyst, unspecified site: Secondary | ICD-10-CM | POA: Diagnosis not present

## 2017-03-07 ENCOUNTER — Other Ambulatory Visit: Payer: Self-pay | Admitting: Interventional Cardiology

## 2017-03-08 ENCOUNTER — Ambulatory Visit (INDEPENDENT_AMBULATORY_CARE_PROVIDER_SITE_OTHER): Payer: Medicare Other

## 2017-03-08 ENCOUNTER — Telehealth: Payer: Self-pay

## 2017-03-08 DIAGNOSIS — Z950 Presence of cardiac pacemaker: Secondary | ICD-10-CM

## 2017-03-08 DIAGNOSIS — H26491 Other secondary cataract, right eye: Secondary | ICD-10-CM | POA: Diagnosis not present

## 2017-03-08 DIAGNOSIS — I5022 Chronic systolic (congestive) heart failure: Secondary | ICD-10-CM

## 2017-03-08 NOTE — Telephone Encounter (Signed)
Remote ICM transmission received.  Attempted patient call and left detailed message regarding transmission and next ICM scheduled for 04/11/2017.  Advised to return call for any fluid symptoms or questions.

## 2017-03-08 NOTE — Progress Notes (Signed)
EPIC Encounter for ICM Monitoring  Patient Name: Anita Duke is a 76 y.o. female Date: 03/08/2017 Primary Care Physican: Binnie Rail, MD Primary [place] Electrophysiologist: Allred Dry Weight:unknown Bi-V Pacing: 97%       Attempted call to patient and unable to reach.  Left detailed message regarding transmission.  Transmission reviewed.    Thoracic impedance normal.  Prescribed dosage: Furosemide 40 mg 0.5 tablet (20 mg total) daily. Potassium 20 mEq 3 tablets daily.  Labs: 01/09/2017 Creatinine 0.62, BUN 19, Potassium 4.7, Sodium 139 07/07/2016 Creatinine 0.57, BUN 15, Potassium 3.8, Sodium 142, EGFR 109.46  01/05/2016 Creatinine 0.60, BUN 10, Potassium 4.3, Sodium 142, EGFR 103.31  11/30/2015 Creatinine 0.66, BUN 14, Potassium 3.8, Sodium 140   Recommendations: Left voice mail with ICM number and encouraged to call for fluid symptoms.  Follow-up plan: ICM clinic phone appointment on 04/11/2017.   Copy of ICM check sent to device physician.   3 month ICM trend: 03/08/2017   1 Year ICM trend:      [name], RN 03/08/2017 9:03 AM

## 2017-03-16 ENCOUNTER — Ambulatory Visit (INDEPENDENT_AMBULATORY_CARE_PROVIDER_SITE_OTHER): Payer: Medicare Other | Admitting: General Practice

## 2017-03-16 DIAGNOSIS — Z5181 Encounter for therapeutic drug level monitoring: Secondary | ICD-10-CM | POA: Diagnosis not present

## 2017-03-16 DIAGNOSIS — I4891 Unspecified atrial fibrillation: Secondary | ICD-10-CM | POA: Diagnosis not present

## 2017-03-16 LAB — POCT INR: INR: 2.4

## 2017-03-16 NOTE — Patient Instructions (Signed)
Pre visit review using our clinic review tool, if applicable. No additional management support is needed unless otherwise documented below in the visit note. 

## 2017-03-19 ENCOUNTER — Telehealth: Payer: Self-pay | Admitting: Internal Medicine

## 2017-03-19 MED ORDER — AMOXICILLIN 500 MG PO CAPS: ORAL_CAPSULE | ORAL | 0 refills | Status: DC

## 2017-03-19 NOTE — Telephone Encounter (Signed)
Ok - rx pending - could not find [facility]

## 2017-03-19 NOTE — Telephone Encounter (Signed)
RX sent, LVM informing pt.

## 2017-03-19 NOTE — Telephone Encounter (Signed)
Patient states she has to get a root cannel, cleaning visit and a few other dentist visits for the year.  Patient states that b/c of heart condition she has to take 4 tablets amoxacillin 500 mg before each visit.  Patient would like to know if Dr. Quay Burow could send a 30 Qty script (b/c cost is same as Scientist, product/process development and patient states this would cover multiple visits) to [facility] on [address].

## 2017-03-29 DIAGNOSIS — H903 Sensorineural hearing loss, bilateral: Secondary | ICD-10-CM | POA: Diagnosis not present

## 2017-03-29 HISTORY — DX: Sensorineural hearing loss, bilateral: H90.3

## 2017-04-02 DIAGNOSIS — H02403 Unspecified ptosis of bilateral eyelids: Secondary | ICD-10-CM | POA: Diagnosis not present

## 2017-04-11 ENCOUNTER — Ambulatory Visit (INDEPENDENT_AMBULATORY_CARE_PROVIDER_SITE_OTHER): Payer: Medicare Other | Admitting: *Deleted

## 2017-04-11 DIAGNOSIS — I442 Atrioventricular block, complete: Secondary | ICD-10-CM | POA: Diagnosis not present

## 2017-04-11 DIAGNOSIS — Z950 Presence of cardiac pacemaker: Secondary | ICD-10-CM | POA: Diagnosis not present

## 2017-04-11 DIAGNOSIS — I5022 Chronic systolic (congestive) heart failure: Secondary | ICD-10-CM | POA: Diagnosis not present

## 2017-04-11 NOTE — Progress Notes (Signed)
Remote pacemaker transmission.   

## 2017-04-12 ENCOUNTER — Encounter: Payer: Self-pay | Admitting: Cardiology

## 2017-04-12 ENCOUNTER — Telehealth: Payer: Self-pay

## 2017-04-12 NOTE — Progress Notes (Signed)
EPIC Encounter for ICM Monitoring  Patient Name: TEMPIE GIBEAULT is a 76 y.o. female Date: 04/12/2017 Primary Care Physican: Binnie Rail, MD Primary [place] Electrophysiologist: Allred Dry Weight:unknown Bi-V Pacing: 97%                                                     Attempted call to patient and unable to reach.  Left detailed message regarding transmission.  Transmission reviewed.    Thoracic impedance normal.  Prescribed dosage: Furosemide 40 mg 0.5 tablet (20 mg total) daily. Potassium 20 mEq 3 tablets daily.  Labs: 01/09/2017 Creatinine 0.62, BUN 19, Potassium 4.7, Sodium 139 07/07/2016 Creatinine 0.57, BUN 15, Potassium 3.8, Sodium 142, EGFR 109.46  01/05/2016 Creatinine 0.60, BUN 10, Potassium 4.3, Sodium 142, EGFR 103.31  11/30/2015 Creatinine 0.66, BUN 14, Potassium 3.8, Sodium 140   Recommendations: Left voice mail with ICM number and encouraged to call for fluid symptoms.  Follow-up plan: ICM clinic phone appointment on 05/14/2017.   Copy of ICM check sent to device physician.   3 month ICM trend: 04/11/2017   1 Year ICM trend:      [name], RN 04/12/2017 3:42 PM

## 2017-04-12 NOTE — Telephone Encounter (Signed)
Remote ICM transmission received.  Attempted patient call and left detailed message regarding transmission and next ICM scheduled for 05/14/2017.  Advised to return call for any fluid symptoms or questions.

## 2017-04-17 LAB — CUP PACEART REMOTE DEVICE CHECK
Battery Remaining Longevity: 92 mo
Date Time Interrogation Session: 20180808102032
Implantable Lead Implant Date: 20130913
Implantable Lead Implant Date: 20130913
Implantable Lead Location: 753858
Implantable Lead Location: 753859
Lead Channel Impedance Value: 410 Ohm
Lead Channel Impedance Value: 460 Ohm
Lead Channel Pacing Threshold Amplitude: 0.75 V
Lead Channel Pacing Threshold Pulse Width: 0.6 ms
Lead Channel Sensing Intrinsic Amplitude: 9 mV
Lead Channel Setting Pacing Amplitude: 2 V
Lead Channel Setting Pacing Pulse Width: 0.4 ms
Lead Channel Setting Pacing Pulse Width: 0.6 ms
Lead Channel Setting Sensing Sensitivity: 3 mV
MDC IDC LEAD IMPLANT DT: 20150303
MDC IDC LEAD LOCATION: 753860
MDC IDC MSMT BATTERY REMAINING PERCENTAGE: 95.5 %
MDC IDC MSMT BATTERY VOLTAGE: 2.98 V
MDC IDC MSMT LEADCHNL LV PACING THRESHOLD AMPLITUDE: 1.75 V
MDC IDC MSMT LEADCHNL RV PACING THRESHOLD PULSEWIDTH: 0.4 ms
MDC IDC PG IMPLANT DT: 20150303
MDC IDC SET LEADCHNL LV PACING AMPLITUDE: 2.75 V
Pulse Gen Model: 3242
Pulse Gen Serial Number: 7548835

## 2017-04-20 ENCOUNTER — Other Ambulatory Visit: Payer: Self-pay | Admitting: Internal Medicine

## 2017-04-20 ENCOUNTER — Other Ambulatory Visit: Payer: Self-pay | Admitting: Interventional Cardiology

## 2017-04-20 ENCOUNTER — Ambulatory Visit (INDEPENDENT_AMBULATORY_CARE_PROVIDER_SITE_OTHER): Payer: Medicare Other | Admitting: General Practice

## 2017-04-20 DIAGNOSIS — Z5181 Encounter for therapeutic drug level monitoring: Secondary | ICD-10-CM | POA: Diagnosis not present

## 2017-04-20 LAB — POCT INR: INR: 2.3

## 2017-04-20 NOTE — Patient Instructions (Signed)
Pre visit review using our clinic review tool, if applicable. No additional management support is needed unless otherwise documented below in the visit note. 

## 2017-04-20 NOTE — Progress Notes (Signed)
I have reviewed and agree with the plan. 

## 2017-04-23 ENCOUNTER — Other Ambulatory Visit: Payer: Self-pay | Admitting: General Practice

## 2017-04-23 DIAGNOSIS — M7130 Other bursal cyst, unspecified site: Secondary | ICD-10-CM | POA: Diagnosis not present

## 2017-04-23 DIAGNOSIS — L821 Other seborrheic keratosis: Secondary | ICD-10-CM | POA: Diagnosis not present

## 2017-04-23 DIAGNOSIS — L720 Epidermal cyst: Secondary | ICD-10-CM | POA: Diagnosis not present

## 2017-04-23 DIAGNOSIS — Z85828 Personal history of other malignant neoplasm of skin: Secondary | ICD-10-CM | POA: Diagnosis not present

## 2017-04-23 DIAGNOSIS — L820 Inflamed seborrheic keratosis: Secondary | ICD-10-CM | POA: Diagnosis not present

## 2017-04-23 MED ORDER — WARFARIN SODIUM 2.5 MG PO TABS: ORAL_TABLET | ORAL | 1 refills | Status: DC

## 2017-04-30 ENCOUNTER — Encounter: Payer: Self-pay | Admitting: Cardiology

## 2017-05-14 ENCOUNTER — Ambulatory Visit (INDEPENDENT_AMBULATORY_CARE_PROVIDER_SITE_OTHER): Payer: Medicare Other

## 2017-05-14 DIAGNOSIS — Z950 Presence of cardiac pacemaker: Secondary | ICD-10-CM | POA: Diagnosis not present

## 2017-05-14 DIAGNOSIS — I5022 Chronic systolic (congestive) heart failure: Secondary | ICD-10-CM | POA: Diagnosis not present

## 2017-05-14 NOTE — Progress Notes (Signed)
EPIC Encounter for ICM Monitoring  Patient Name: Anita Duke is a 76 y.o. female Date: 05/14/2017 Primary Care Physican: Binnie Rail, MD Primary [place] Electrophysiologist: Allred Dry Weight:unknown Bi-V Pacing: 97%       Heart Failure questions reviewed, pt asymptomatic.   Thoracic impedance normal but was abnormal suggesting fluid accumulation 04/29/2017 to 05/10/2017 which correlates with her vacation time.  Prescribed dosage: Furosemide 40 mg 0.5 tablet (20 mg total) daily. Potassium 20 mEq 3 tablets daily.  Labs: 01/09/2017 Creatinine 0.62, BUN 19, Potassium 4.7, Sodium 139 07/07/2016 Creatinine 0.57, BUN 15, Potassium 3.8, Sodium 142, EGFR 109.46  01/05/2016 Creatinine 0.60, BUN 10, Potassium 4.3, Sodium 142, EGFR 103.31  11/30/2015 Creatinine 0.66, BUN 14, Potassium 3.8, Sodium 140   Recommendations: No changes.   Encouraged to call for fluid symptoms.  Follow-up plan: ICM clinic phone appointment on 06/14/2017.    Copy of ICM check sent to Dr. Rayann Heman.   3 month ICM trend: 05/14/2017   1 Year ICM trend:      [name], RN 05/14/2017 10:44 AM

## 2017-05-16 ENCOUNTER — Ambulatory Visit: Payer: Medicare Other | Admitting: Podiatry

## 2017-05-22 ENCOUNTER — Other Ambulatory Visit: Payer: Self-pay | Admitting: Internal Medicine

## 2017-06-01 ENCOUNTER — Ambulatory Visit (INDEPENDENT_AMBULATORY_CARE_PROVIDER_SITE_OTHER): Payer: Medicare Other | Admitting: General Practice

## 2017-06-01 DIAGNOSIS — Z7901 Long term (current) use of anticoagulants: Secondary | ICD-10-CM | POA: Diagnosis not present

## 2017-06-01 DIAGNOSIS — I4891 Unspecified atrial fibrillation: Secondary | ICD-10-CM

## 2017-06-01 DIAGNOSIS — Z5181 Encounter for therapeutic drug level monitoring: Secondary | ICD-10-CM

## 2017-06-01 LAB — POCT INR: INR: 3.5

## 2017-06-01 NOTE — Progress Notes (Signed)
I have reviewed and agree with the plan. 

## 2017-06-01 NOTE — Patient Instructions (Signed)
Pre visit review using our clinic review tool, if applicable. No additional management support is needed unless otherwise documented below in the visit note. 

## 2017-06-08 ENCOUNTER — Other Ambulatory Visit: Payer: Self-pay | Admitting: Internal Medicine

## 2017-06-14 ENCOUNTER — Ambulatory Visit (INDEPENDENT_AMBULATORY_CARE_PROVIDER_SITE_OTHER): Payer: Medicare Other

## 2017-06-14 DIAGNOSIS — I5022 Chronic systolic (congestive) heart failure: Secondary | ICD-10-CM

## 2017-06-14 DIAGNOSIS — Z950 Presence of cardiac pacemaker: Secondary | ICD-10-CM

## 2017-06-14 NOTE — Progress Notes (Signed)
EPIC Encounter for ICM Monitoring  Patient Name: Anita Duke is a 76 y.o. female Date: 06/14/2017 Primary Care Physican: Binnie Rail, MD Primary [place] Electrophysiologist: Allred Dry Weight:unknown Bi-V Pacing: 98%      Attempted call to patient and unable to reach.  Left detailed message regarding transmission.  Transmission reviewed.    Thoracic impedance normal bit was abnormal suggesting fluid accumulation from 05/26/2017 to 06/05/2017.  Prescribed dosage: Furosemide 40 mg 0.5 tablet (20 mg total) daily. Potassium 20 mEq 3 tablets daily.  Labs: 01/09/2017 Creatinine 0.62, BUN 19, Potassium 4.7, Sodium 139 07/07/2016 Creatinine 0.57, BUN 15, Potassium 3.8, Sodium 142, EGFR 109.46  01/05/2016 Creatinine 0.60, BUN 10, Potassium 4.3, Sodium 142, EGFR 103.31  11/30/2015 Creatinine 0.66, BUN 14, Potassium 3.8, Sodium 140   Recommendations: Left voice mail with ICM number and encouraged to call if experiencing any fluid symptoms.  Follow-up plan: ICM clinic phone appointment on 07/16/2017.  Office appointment scheduled 06/18/2017 with Dr. Aundra Dubin for 1st HF clinic visit.  Copy of ICM check sent to Dr. Rayann Heman.   3 month ICM trend: 06/14/2017   1 Year ICM trend:      [name], RN 06/14/2017 10:07 AM

## 2017-06-15 ENCOUNTER — Telehealth: Payer: Self-pay

## 2017-06-15 NOTE — Telephone Encounter (Signed)
Remote ICM transmission received.  Attempted call to patient and left detailed message per DPR regarding transmission and next ICM scheduled for 07/16/2017.  Advised to return call for any fluid symptoms or questions.    

## 2017-06-18 ENCOUNTER — Ambulatory Visit (HOSPITAL_COMMUNITY)
Admission: RE | Admit: 2017-06-18 | Discharge: 2017-06-18 | Disposition: A | Discharge status: Home / Self Care | Payer: Medicare Other | Source: Ambulatory Visit | Attending: Cardiology | Admitting: Cardiology

## 2017-06-18 ENCOUNTER — Encounter (HOSPITAL_COMMUNITY): Payer: Self-pay | Admitting: Cardiology

## 2017-06-18 VITALS — BP 133/65 | HR 69 | Wt 160.0 lb

## 2017-06-18 DIAGNOSIS — I10 Essential (primary) hypertension: Secondary | ICD-10-CM | POA: Diagnosis not present

## 2017-06-18 DIAGNOSIS — I4821 Permanent atrial fibrillation: Secondary | ICD-10-CM

## 2017-06-18 DIAGNOSIS — I272 Pulmonary hypertension, unspecified: Secondary | ICD-10-CM | POA: Insufficient documentation

## 2017-06-18 DIAGNOSIS — Z7901 Long term (current) use of anticoagulants: Secondary | ICD-10-CM | POA: Diagnosis not present

## 2017-06-18 DIAGNOSIS — J449 Chronic obstructive pulmonary disease, unspecified: Secondary | ICD-10-CM | POA: Diagnosis not present

## 2017-06-18 DIAGNOSIS — I482 Chronic atrial fibrillation: Secondary | ICD-10-CM | POA: Diagnosis not present

## 2017-06-18 DIAGNOSIS — Z952 Presence of prosthetic heart valve: Secondary | ICD-10-CM | POA: Insufficient documentation

## 2017-06-18 DIAGNOSIS — I429 Cardiomyopathy, unspecified: Secondary | ICD-10-CM | POA: Insufficient documentation

## 2017-06-18 DIAGNOSIS — Z79899 Other long term (current) drug therapy: Secondary | ICD-10-CM | POA: Diagnosis not present

## 2017-06-18 DIAGNOSIS — I5022 Chronic systolic (congestive) heart failure: Secondary | ICD-10-CM | POA: Diagnosis not present

## 2017-06-18 DIAGNOSIS — F329 Major depressive disorder, single episode, unspecified: Secondary | ICD-10-CM | POA: Insufficient documentation

## 2017-06-18 DIAGNOSIS — Z85038 Personal history of other malignant neoplasm of large intestine: Secondary | ICD-10-CM | POA: Insufficient documentation

## 2017-06-18 DIAGNOSIS — I340 Nonrheumatic mitral (valve) insufficiency: Secondary | ICD-10-CM | POA: Diagnosis not present

## 2017-06-18 DIAGNOSIS — I4891 Unspecified atrial fibrillation: Secondary | ICD-10-CM

## 2017-06-18 DIAGNOSIS — I2510 Atherosclerotic heart disease of native coronary artery without angina pectoris: Secondary | ICD-10-CM | POA: Insufficient documentation

## 2017-06-18 DIAGNOSIS — E119 Type 2 diabetes mellitus without complications: Secondary | ICD-10-CM | POA: Diagnosis not present

## 2017-06-18 DIAGNOSIS — I442 Atrioventricular block, complete: Secondary | ICD-10-CM | POA: Insufficient documentation

## 2017-06-18 DIAGNOSIS — Z950 Presence of cardiac pacemaker: Secondary | ICD-10-CM | POA: Diagnosis not present

## 2017-06-18 DIAGNOSIS — I519 Heart disease, unspecified: Secondary | ICD-10-CM | POA: Diagnosis not present

## 2017-06-18 LAB — CBC
HEMATOCRIT: 46.5 % — AB (ref 36.0–46.0)
HEMOGLOBIN: 15.3 g/dL — AB (ref 12.0–15.0)
MCH: 30.1 pg (ref 26.0–34.0)
MCHC: 32.9 g/dL (ref 30.0–36.0)
MCV: 91.5 fL (ref 78.0–100.0)
Platelets: 177 10*3/uL (ref 150–400)
RBC: 5.08 MIL/uL (ref 3.87–5.11)
RDW: 13.8 % (ref 11.5–15.5)
WBC: 5.6 10*3/uL (ref 4.0–10.5)

## 2017-06-18 LAB — BASIC METABOLIC PANEL
ANION GAP: 6 (ref 5–15)
BUN: 12 mg/dL (ref 6–20)
CHLORIDE: 104 mmol/L (ref 101–111)
CO2: 29 mmol/L (ref 22–32)
Calcium: 9.1 mg/dL (ref 8.9–10.3)
Creatinine, Ser: 0.55 mg/dL (ref 0.44–1.00)
GFR calc Af Amer: >60 mL/min (ref >60)
GFR calc non Af Amer: >60 mL/min (ref >60)
GLUCOSE: 129 mg/dL — AB (ref 65–99)
POTASSIUM: 3.6 mmol/L (ref 3.5–5.1)
Sodium: 139 mmol/L (ref 135–145)

## 2017-06-18 LAB — BRAIN NATRIURETIC PEPTIDE: B Natriuretic Peptide: 101.9 pg/mL — ABNORMAL HIGH (ref 0.0–100.0)

## 2017-06-18 MED ORDER — SPIRONOLACTONE 25 MG PO TABS: 12.5000 mg | ORAL_TABLET | Freq: Every day | ORAL | 3 refills | Status: DC

## 2017-06-18 MED FILL — SPIRONOLACTONE 25 MG TABLET: 25 | 30 days supply | Qty: 15 | Fill #0

## 2017-06-18 NOTE — Patient Instructions (Signed)
Start Spironolactone 12.5 mg (1/2 tab) daily  Labs today  Labs in 1 week  Your physician has requested that you have an echocardiogram. Echocardiography is a painless test that uses sound waves to create images of your heart. It provides your doctor with information about the size and shape of your heart and how well your heart's chambers and valves are working. This procedure takes approximately one hour. There are no restrictions for this procedure.  We will contact you in 3 months to schedule your next appointment.

## 2017-06-18 NOTE — Progress Notes (Signed)
Pre visit review using our clinic review tool, if applicable. No additional management support is needed unless otherwise documented below in the visit note. 

## 2017-06-18 NOTE — Progress Notes (Addendum)
Subjective:   Anita Duke is a 76 y.o. female who presents for Medicare Annual (Subsequent) preventive examination.  Review of Systems:  No ROS.  Medicare Wellness Visit. Additional risk factors are reflected in the social history.  Cardiac Risk Factors include: advanced age (>25mn, >>86women);diabetes mellitus;dyslipidemia;hypertension Sleep patterns: Has difficulty going to sleep, gets up 1 time to void, sleep 6 hours nightly. Patient reports insomnia issues, discussed recommended sleep tips and stress reduction tips.   Home Safety/Smoke Alarms: Feels safe in home. Smoke alarms in place.  Living environment; residence and Firearm Safety: 2-story house, no firearms Lives alone, no needs for DME, limited support system. Seat Belt Safety/Bike Helmet: Wears seat belt.    Objective:     Vitals: BP 138/70   Pulse 80   Resp 20   Ht _0  (1.6 m)   Wt 161 lb (73 kg)   SpO2 98%   BMI 28.52 kg/m   Body mass index is 28.52 kg/m.   Tobacco History  Smoking Status  . Never Smoker  Smokeless Tobacco  . Never Used     Counseling given: Not Answered   Past Medical History:  Diagnosis Date  . Anemia    Hemoglobin 10.4, December, 2013  . Arthritis    "not bad; little in my hands; some in my knees" (11/04/2013)  . Asthma   . CAD (coronary artery disease)    LIMA to the LAD at time of mitral valve repair / LIMA atretic,, February, 2011  . Cataracts, bilateral   . 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 CHF (congestive heart failure) ([facility]   . Colon polyp, hyperplastic   . COPD (chronic obstructive pulmonary disease) ([place]   . Depression   . Diverticulosis of colon   . GERD (gastroesophageal reflux disease)   . Hemorrhoids, internal   . High cholesterol   . HTN (hypertension)   . Mitral regurgitation    a. s/p repair with LAA ligation and MAZE at time of surgery  . Permanent atrial fibrillation (HCC)    a. s/p MAZE b. s/p AVN ablation  . Pulmonary HTN ([place]   . QT prolongation      Tikosyn and Effexor. QT prolonged October 13, 2011, peak is in dose reduced from 500  to -250 twice a day  . Type II diabetes mellitus ([place]    Past Surgical History:  Procedure Laterality Date  . ABDOMINAL HYSTERECTOMY  ?2002  . AV NODE ABLATION N/A 05/28/2012   Procedure: AV NODE ABLATION;  Surgeon: JThompson Grayer MD;  [facility];  Service: Cardiovascular;  Laterality: N/A;  . BI-VENTRICULAR PACEMAKER UPGRADE N/A 11/04/2013   upgrade of previously implanted dual chamber pacemaker to STJ Quadra Allure CRTP by Dr ARayann Heman . BREAST LUMPECTOMY Bilateral 1990's  . CARDIOVERSION  02/08/2012   Procedure: CARDIOVERSION;  Surgeon: JCarlena Bjornstad MD;  [place] [facility]  Service: Cardiovascular;  Laterality: N/A;  . CARDIOVERSION  02/29/2012   Procedure: CARDIOVERSION;  Surgeon: JCarlena Bjornstad MD;  [place]  Service: Cardiovascular;  Laterality: N/A;  . CARDIOVERSION  05/15/2012   Procedure: CARDIOVERSION;  Surgeon: JCarlena Bjornstad MD;  [place]  Service: Cardiovascular;  Laterality: N/A;  . CATARACT EXTRACTION, BILATERAL  2017  . CHOLECYSTECTOMY  1995  . CORONARY ARTERY BYPASS GRAFT  2001   CABG X1 "at time of mitral valve repair" (12/04/2012  . DIAGNOSTIC LAPAROSCOPIC LIVER BIOPSY Left 12/03/2012   Procedure: DIAGNOSTIC LAPAROSCOPIC LIVER BIOPSY;  Surgeon: Stark Klein, MD;  Location: [place];  Service: General;  Laterality: Left;  . LAPAROSCOPIC RIGHT HEMI COLECTOMY  12/03/2012   Procedure: LAPAROSCOPIC RIGHT HEMI COLECTOMY;  Surgeon: Stark Klein, MD;  Location: [place];  Service: General;;  . MAZE  2001   w/ MVR & CABG  . MITRAL VALVE REPAIR  2001   "anterior and posterior leaflets" (12/04/2012)  . 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 TONSILLECTOMY AND ADENOIDECTOMY  ~ 1950  . UMBILICAL HERNIA REPAIR N/A 12/03/2012   Procedure: HERNIA REPAIR UMBILICAL ADULT;  Surgeon: Stark Klein, MD;  [facility];  Service: General;  Laterality: N/A;   Family History  Problem Relation Age of Onset  . Heart disease Father   . Diabetes Father   . Heart disease Sister        x 2  . Diabetes Mother   . Kidney disease Mother   . Thyroid disease Mother   . Pancreatic cancer Maternal Grandfather   . Diabetes Paternal Grandfather   . Diabetes Paternal Grandmother   . Heart disease Paternal Uncle        x 6  . Prostate cancer Paternal Uncle   . Colon cancer Neg Hx   . Colon polyps Neg Hx   . Adrenal disorder Neg Hx    History  Sexual Activity  . Sexual activity: No    Outpatient Encounter Prescriptions as of 06/20/2017  Medication Sig  . ACCU-CHEK AVIVA PLUS test strip USE 2 TIMES DAILY AS  DIRECTED  . ACCU-CHEK SOFTCLIX LANCETS lancets USE TO CHECK BLOOD SUGARS  TWICE A DAY  . acetaminophen (TYLENOL) 325 MG tablet Take 650 mg by mouth every 6 (six) hours as needed.  Marland Kitchen amoxicillin (AMOXIL) 500 MG capsule Take 4 tabs prior to dental appointments  . atorvastatin (LIPITOR) 40 MG tablet TAKE 1 TABLET BY MOUTH  DAILY  . blood glucose meter kit and supplies KIT Dispense based on patient and insurance preference. Check sugars daily. NIDDM  . Blood Glucose Monitoring Suppl (ACCU-CHEK AVIVA PLUS) w/Device KIT Use to check blood sugars daily Dx E11.9  . calcium carbonate (OS-CAL) 600 MG TABS Take 600 mg by mouth daily.   . carvedilol (COREG) 6.25 MG tablet TAKE 1 TABLET BY MOUTH  TWICE A DAY WITH MEALS  . cholecalciferol (VITAMIN D) 1000 UNITS tablet Take 6,000 Units by mouth daily.   . fish oil-omega-3 fatty acids 1000 MG capsule Take 1 g by mouth 2 (two) times daily.   . furosemide (LASIX) 40 MG tablet TAKE ONE-HALF TABLET BY  MOUTH DAILY  . JARDIANCE 10 MG TABS tablet TAKE 1 TABLET BY MOUTH  DAILY  . loratadine (CLARITIN) 10 MG tablet Take 10 mg by mouth daily.  . potassium chloride SA (K-DUR,KLOR-CON) 20 MEQ tablet TAKE 3 TABLETS BY MOUTH  DAILY  . spironolactone (ALDACTONE) 25 MG tablet Take  0.5 tablets (12.5 mg total) by mouth daily.  . trandolapril (MAVIK) 4 MG tablet TAKE 1 TABLET BY MOUTH TWO  TIMES DAILY FOR BLOOD  PRESSURE  . venlafaxine XR (EFFEXOR-XR) 37.5 MG 24 hr capsule TAKE 1 CAPSULE BY MOUTH  EVERY EVENING  . warfarin (COUMADIN) 2.5 MG tablet TAKE AS DIRECTED BY  ANTICOAGULATION CLINIC  . loperamide (IMODIUM) 2 MG capsule Take 1 capsule (2 mg total) by mouth daily as needed for diarrhea or loose stools.   No facility-administered encounter medications on file as of  06/20/2017.     Activities of Daily Living In your present state of health, do you have any difficulty performing the following activities: 06/20/2017  Hearing? N  Vision? N  Difficulty concentrating or making decisions? N  Walking or climbing stairs? N  Dressing or bathing? N  Doing errands, shopping? N  Preparing Food and eating ? N  Using the Toilet? N  In the past six months, have you accidently leaked urine? N  Do you have problems with loss of bowel control? N  Managing your Medications? N  Managing your Finances? N  Housekeeping or managing your Housekeeping? N  Some recent data might be hidden    Patient Care Team: Binnie Rail, MD as PCP - General (Internal Medicine) Carlena Bjornstad, MD as Consulting Physician (Cardiology) Inda Castle, MD as Consulting Physician (Gastroenterology)    Assessment:    Physical assessment deferred to PCP.  Exercise Activities and Dietary recommendations Current Exercise Habits: Home exercise routine, Type of exercise: walking, Time (Minutes): 35, Frequency (Times/Week): 5, Weekly Exercise (Minutes/Week): 175, Intensity: Mild, Exercise limited by: cardiac condition(s)  Diet (meal preparation, eat out, water intake, caffeinated beverages, dairy products, fruits and vegetables): in general, a "healthy" diet     Reviewed heart healthy and diabetic diet, discussed reading food labels, encouraged patient to increase daily water intake.     Goals      . Stay as active and as independent as possible          Monitor my diet and read food labels.      Fall Risk Fall Risk  06/20/2017 12/02/2014 08/25/2014 11/20/2013 08/22/2013  Falls in the past year? No No No Yes No  Number falls in past yr: - - - 1 -  Injury with Fall? - - - No -  Risk for fall due to : - - - Impaired balance/gait;Medication side effect -   Depression Screen PHQ 2/9 Scores 06/20/2017 12/02/2014 08/25/2014 11/20/2013  PHQ - 2 Score 2 0 0 0  PHQ- 9 Score 2 - - -     Cognitive Function MMSE - Mini Mental State Exam 06/20/2017  Orientation to time 5  Orientation to Place 5  Registration 3  Attention/ Calculation 5  Recall 3  Language- name 2 objects 2  Language- repeat 1  Language- follow 3 step command 3  Language- read & follow direction 1  Write a sentence 1  Copy design 1  Total score 30        Immunization History  Administered Date(s) Administered  . Influenza Split 06/24/2011, 05/14/2017  . Influenza Whole 06/04/2012, 05/16/2013  . Pneumococcal Conjugate-13 08/25/2014  . Pneumococcal Polysaccharide-23 08/10/2011  . Zoster 05/23/2006   Screening Tests Health Maintenance  Topic Date Due  . TETANUS/TDAP  07/26/2023 (Originally 01/11/1960)  . OPHTHALMOLOGY EXAM  02/20/2018  . FOOT EXAM  02/21/2018  . DEXA SCAN  01/05/2019  . COLONOSCOPY  01/04/2020  . INFLUENZA VACCINE  Completed  . PNA vac Low Risk Adult  Completed      Plan:  Continue doing brain stimulating activities (puzzles, reading, adult coloring books, staying active) to keep memory sharp.   Continue to eat heart healthy diet (full of fruits, vegetables, whole grains, lean protein, water--limit salt, fat, and sugar intake) and increase physical activity as tolerated.  I have personally reviewed and noted the following in the patient's chart:   . Medical and social history . Use of alcohol, tobacco or illicit drugs  .  Current medications and supplements . Functional ability and  status . Nutritional status . Physical activity . Advanced directives . List of other physicians . Vitals . Screenings to include cognitive, depression, and falls . Referrals and appointments  In addition, I have reviewed and discussed with patient certain preventive protocols, quality metrics, and best practice recommendations. A written personalized care plan for preventive services as well as general preventive health recommendations were provided to patient.     Michiel Cowboy, RN  06/20/2017    Medical screening examination/treatment/procedure(s) were performed by non-physician practitioner and as supervising physician I was immediately available for consultation/collaboration. I agree with above. Binnie Rail, MD

## 2017-06-18 NOTE — Progress Notes (Signed)
Advanced Heart Failure Clinic Note    Primary Care: Dr. Billey Gosling Primary Cardiologist: New to Dr. Aundra Dubin, saw Dr. Ron Parker in the past EP: Dr. Rayann Heman   HPI: [name] is a 76 year old female with a past medical history of pulmonary HTN, mitral regurgitation s/p mitral valve repair, MAZE procedure at the [facility] in 2001, at that time she also had a LIMA to LAD graft placed. Also with history of permanent Afib on warfarin s/p AVN ablation. At one time she was on Tikosyn which was stopped for QT prolongation.  She has chronic systolic CHF (EF 62% in 04/3661).  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 Last cath in 2014 with atretic LIMA to LAD but nonobstructive disease in LAD.   Anita Duke says that she is SOB with stairs, has been this way for a few years. She recently went to [facility] and could not keep up with the group due to SOB. She walks her dog daily, gets SOB with that as well. SOB with carrying groceries. Uses 2 pillows for sleep, denies PND. Had an episode of chest pain about 3 weeks ago after cleaning up something in the house, sat down and felt better. Associated SOB, no diaphoresis. Weights at home 158-159 pounds. Taking all medications. Drinking more than 2L a day. Eats out a lot. Her symptoms have not changed significantly over the last year.   ECG (6/18, personally reviewed): atrial fibrillation with BiV pacing  Labs (5/18): K 4.7, creatinine 0.62, LDL 71, HDL 61  St Jude device interrogation: Corevue shows stable thoracic impedance.  98% BiV pacing.   ROS: All systems reviewed and negative except as per HPI.  PMH: 1. Type II diabetes. 2. COPD 3. Depression 4. H/o colon cancer 5. CAD: LIMA-LAD in 4/01 with MV repair.   - LHC (8/14): LIMA-LAD atretic, 50-60% mid LAD stenosis.  6. Mitral regurgitation: S/p MV repair at the [facility] in 4/01.  She also had LAA ligation and MAZE.  7. 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   - St Jude  CRT-P device.  - Echo (4/16): EF 40%, mild LV dilation, normal RV size with mild to moderately decreased systolic function, s/p mitral valve repair with mild MR, no significant stenosis.  8. Permanent atrial fibrillation: S/p MAZE in 4/01.  S/p AV nodal ablation in 9/13.     Current Outpatient Prescriptions  Medication Sig Dispense Refill  . ACCU-CHEK AVIVA PLUS test strip USE 2 TIMES DAILY AS  DIRECTED 200 each 2  . ACCU-CHEK SOFTCLIX LANCETS lancets USE TO CHECK BLOOD SUGARS  TWICE A DAY 200 each 2  . amoxicillin (AMOXIL) 500 MG capsule Take 4 tabs prior to dental appointments 30 capsule 0  . atorvastatin (LIPITOR) 40 MG tablet TAKE 1 TABLET BY MOUTH  DAILY 90 tablet 2  . blood glucose meter kit and supplies KIT Dispense based on patient and insurance preference. Check sugars daily. NIDDM 1 each 0  . Blood Glucose Monitoring Suppl (ACCU-CHEK AVIVA PLUS) w/Device KIT Use to check blood sugars daily Dx E11.9 1 kit 0  . calcium carbonate (OS-CAL) 600 MG TABS Take 600 mg by mouth daily.     . carvedilol (COREG) 6.25 MG tablet TAKE 1 TABLET BY MOUTH  TWICE A DAY WITH MEALS 180 tablet 2  . cholecalciferol (VITAMIN D) 1000 UNITS tablet Take 6,000 Units by mouth daily.     . fish oil-omega-3 fatty acids 1000 MG capsule Take 1 g by  mouth 2 (two) times daily.     . furosemide (LASIX) 40 MG tablet TAKE ONE-HALF TABLET BY  MOUTH DAILY 45 tablet 2  . JARDIANCE 10 MG TABS tablet TAKE 1 TABLET BY MOUTH  DAILY 90 tablet 1  . loperamide (IMODIUM) 2 MG capsule Take 1 capsule (2 mg total) by mouth daily as needed for diarrhea or loose stools. 30 capsule 10  . loratadine (CLARITIN) 10 MG tablet Take 10 mg by mouth daily.    . potassium chloride SA (K-DUR,KLOR-CON) 20 MEQ tablet TAKE 3 TABLETS BY MOUTH  DAILY 270 tablet 1  . trandolapril (MAVIK) 4 MG tablet TAKE 1 TABLET BY MOUTH TWO  TIMES DAILY FOR BLOOD  PRESSURE 180 tablet 1  . venlafaxine XR (EFFEXOR-XR) 37.5 MG 24 hr capsule TAKE 1 CAPSULE BY MOUTH  EVERY  EVENING 90 capsule 1  . warfarin (COUMADIN) 2.5 MG tablet TAKE AS DIRECTED BY  ANTICOAGULATION CLINIC 100 tablet 1   No current facility-administered medications for this encounter.     Allergies  Allergen Reactions  . Demerol [Meperidine] Nausea And Vomiting  . Morphine Other (See Comments)    vomiting  . Januvia [Sitagliptin]     Headaches, did not feel well  . Metformin And Related Other (See Comments)    diarrhea  . Tetanus Toxoid Rash    "years ago"      Social History   Social History  . Marital status: Married    Spouse name: N/A  . Number of children: 0  . 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  [place] History Main Topics  . Smoking status: Never Smoker  . Smokeless tobacco: Never Used  . Alcohol use No  . Drug use: No  . Sexual activity: No   Other Topics Concern  . Not on file   Social History Narrative   Does not et regular exercise   Daily caffeine use            Family History  Problem Relation Age of Onset  . Heart disease Father   . Diabetes Father   . Heart disease Sister        x 2  . Diabetes Mother   . Kidney disease Mother   . Thyroid disease Mother   . Pancreatic cancer Maternal Grandfather   . Diabetes Paternal Grandfather   . Diabetes Paternal Grandmother   . Heart disease Paternal Uncle        x 6  . Prostate cancer Paternal Uncle   . Colon cancer Neg Hx   . Colon polyps Neg Hx   . Adrenal disorder Neg Hx     Vitals:   06/18/17 1148  BP: 133/65  Pulse: 69  SpO2: 99%  Weight: 160 lb (72.6 kg)     PHYSICAL EXAM: General:  Well appearing. No respiratory difficulty HEENT: normal Neck: supple. no JVD. Carotids 2+ bilat; no bruits. No lymphadenopathy or thyromegaly appreciated. Cor: PMI nondisplaced. Regular rate & rhythm. No rubs, gallops or murmurs. Lungs: clear Abdomen: soft, nontender, nondistended. No hepatosplenomegaly.  No bruits or masses. Good bowel sounds. Extremities: no cyanosis, clubbing, rash, edema Neuro: alert & oriented x 3, cranial nerves grossly intact. moves all 4 extremities w/o difficulty. Affect pleasant.  ASSESSMENT & PLAN: 1. Chronic systolic CHF: Most recent echo was in 2016, at that time EF 40%. NICM most  likely.  She has LIMA-LAD that is atretic, but no flow limiting disease in the LAD.  Cardiomyopathy may be due to RV pacing after AV nodal ablation in 9/13. She now has [employer] CRT-P device.  NYHA class II-III symptoms.  She is not volume overloaded on exam.   - Need Echo to assess degree of LV dysfunction. Hard to tell if her degree of SOB is chronic from deconditioning/COPD or could be fall in EF (although less likely). If EF is 35% or less, would recommend cardiac rehab.  - Continue Coreg 6.25 mg BID.  - Continue trandolapril for now, if EF < 40%, would transition to [employer].  - Continue lasix 20 mg daily. Corevue and exam with stable volume status. BMET/BNP today.  - Start Spiro 12.5 mg daily, BMET in 10 days.  - She does eat a high sodium diet, drinks more than 2L a day. Encouraged her to watch her diet and keep fluids to less than 2L a day.  2. CAD: LIMA-LAD at time of MV surgery in 4/01, but LIMA-LAD atretic on 8/14 cath.  However, at that time her LAD had moderate stenosis that did not appear flow-limiting (50-60%).  - Continue atorvastatin 40 mg daily. Good LDL in 5/18. - No ASA given stable CAD with use of warfarin 3. Complete heart block: She had an AV nodal ablation.  Now s/p CRT-P.   4. Permanent atrial fibrillation: Now s/p AV nodal ablation and BiV pacing. - Continue warfarin for anticoagulation.  5. DM: Continue Jardiance.  6. S/p MV repair: Reassess MV with repeat echo.   Arbutus Leas, NP 06/18/17   Patient seen with NP, agree with the above note.  We need a repeat echo, it is possible that LV function may have improved with BiV pacing.  Her cardiomyopathy appears to  have developed after AV nodal ablation with RV pacing/dyssynchrony. If EF is < 40%, transition ACEI to [facility].  I will add spironolactone 12.5 mg daily.  BMET/BNP today and again in 10 days.  Followup in 3 months.   Loralie Champagne 06/18/2017

## 2017-06-19 ENCOUNTER — Ambulatory Visit (HOSPITAL_COMMUNITY): Payer: Medicare Other | Attending: Cardiology

## 2017-06-19 ENCOUNTER — Other Ambulatory Visit: Payer: Self-pay

## 2017-06-19 DIAGNOSIS — I420 Dilated cardiomyopathy: Secondary | ICD-10-CM | POA: Insufficient documentation

## 2017-06-19 DIAGNOSIS — I071 Rheumatic tricuspid insufficiency: Secondary | ICD-10-CM | POA: Insufficient documentation

## 2017-06-19 DIAGNOSIS — I519 Heart disease, unspecified: Secondary | ICD-10-CM

## 2017-06-19 LAB — ECHOCARDIOGRAM COMPLETE
Ao-asc: 29 cm
CHL CUP DOP CALC LVOT VTI: 15 cm
CHL CUP MV DEC (S): 296
CHL CUP RV SYS PRESS: 35 mmHg
CHL CUP TV REG PEAK VELOCITY: 282 cm/s
EERAT: 34.37
EWDT: 296 ms
FS: 17 % — AB (ref 28–44)
IVS/LV PW RATIO, ED: 1.08
LA ID, A-P, ES: 53 mm
LA diam end sys: 53 mm
LA vol: 108 mL
LADIAMINDEX: 3.01 cm/m2
LAVOLA4C: 113 mL
LAVOLIN: 61.4 mL/m2
LV E/e' medial: 34.37
LV PW d: 13 mm — AB (ref 0.6–1.1)
LV SIMPSON'S DISK: 40
LV TDI E'LATERAL: 4.19
LV TDI E'MEDIAL: 5.56
LV dias vol index: 60 mL/m2
LV dias vol: 105 mL (ref 46–106)
LV sys vol index: 36 mL/m2
LV sys vol: 63 mL — AB (ref 14–42)
LVEEAVG: 34.37
LVELAT: 4.19 cm/s
LVOT area: 3.46 cm2
LVOT diameter: 21 mm
LVOT peak grad rest: 2 mmHg
LVOTPV: 75.9 cm/s
LVOTSV: 52 mL
Lateral S' vel: 5.68 cm/s
MV Peak grad: 8 mmHg
MVPKEVEL: 144 m/s
Stroke v: 42 ml
TRMAXVEL: 282 cm/s

## 2017-06-20 ENCOUNTER — Ambulatory Visit (INDEPENDENT_AMBULATORY_CARE_PROVIDER_SITE_OTHER): Payer: Medicare Other | Admitting: *Deleted

## 2017-06-20 VITALS — BP 138/70 | HR 80 | Resp 20 | Ht 63.0 in | Wt 161.0 lb

## 2017-06-20 DIAGNOSIS — Z0000 Encounter for general adult medical examination without abnormal findings: Secondary | ICD-10-CM

## 2017-06-20 NOTE — Patient Instructions (Signed)
Continue doing brain stimulating activities (puzzles, reading, adult coloring books, staying active) to keep memory sharp.   Continue to eat heart healthy diet (full of fruits, vegetables, whole grains, lean protein, water--limit salt, fat, and sugar intake) and increase physical activity as tolerated.   [name] , Thank you for taking time to come for your Medicare Wellness Visit. I appreciate your ongoing commitment to your health goals. Please review the following plan we discussed and let me know if I can assist you in the future.   These are the goals we discussed: Goals    . Stay as active and as independent as possible          Monitor my diet and read food labels.       This is a list of the screening recommended for you and due dates:  Health Maintenance  Topic Date Due  . Tetanus Vaccine  07/26/2023*  . Eye exam for diabetics  02/20/2018  . Complete foot exam   02/21/2018  . DEXA scan (bone density measurement)  01/05/2019  . Colon Cancer Screening  01/04/2020  . Flu Shot  Completed  . Pneumonia vaccines  Completed  *Topic was postponed. The date shown is not the original due date.

## 2017-06-22 ENCOUNTER — Ambulatory Visit (INDEPENDENT_AMBULATORY_CARE_PROVIDER_SITE_OTHER): Payer: Medicare Other | Admitting: General Practice

## 2017-06-22 DIAGNOSIS — Z7901 Long term (current) use of anticoagulants: Secondary | ICD-10-CM | POA: Diagnosis not present

## 2017-06-22 DIAGNOSIS — I4891 Unspecified atrial fibrillation: Secondary | ICD-10-CM

## 2017-06-22 LAB — POCT INR: INR: 2.2

## 2017-06-22 NOTE — Patient Instructions (Signed)
Pre visit review using our clinic review tool, if applicable. No additional management support is needed unless otherwise documented below in the visit note. 

## 2017-06-24 NOTE — Progress Notes (Signed)
Agree with management.  Anita Yoo J Katara Griner, MD  

## 2017-06-26 ENCOUNTER — Other Ambulatory Visit: Payer: Self-pay | Admitting: Internal Medicine

## 2017-06-27 ENCOUNTER — Other Ambulatory Visit (HOSPITAL_COMMUNITY): Payer: Medicare Other

## 2017-06-27 ENCOUNTER — Ambulatory Visit: Payer: Medicare Other

## 2017-06-29 ENCOUNTER — Other Ambulatory Visit: Payer: Self-pay | Admitting: Internal Medicine

## 2017-07-04 ENCOUNTER — Ambulatory Visit (HOSPITAL_COMMUNITY)
Admission: RE | Admit: 2017-07-04 | Discharge: 2017-07-04 | Disposition: A | Discharge status: Home / Self Care | Payer: Medicare Other | Source: Ambulatory Visit | Attending: Internal Medicine | Admitting: Internal Medicine

## 2017-07-04 DIAGNOSIS — I519 Heart disease, unspecified: Secondary | ICD-10-CM | POA: Insufficient documentation

## 2017-07-04 LAB — BASIC METABOLIC PANEL
Anion gap: 8 (ref 5–15)
BUN: 15 mg/dL (ref 6–20)
CO2: 29 mmol/L (ref 22–32)
Calcium: 8.9 mg/dL (ref 8.9–10.3)
Chloride: 103 mmol/L (ref 101–111)
Creatinine, Ser: 0.59 mg/dL (ref 0.44–1.00)
Glucose, Bld: 190 mg/dL — ABNORMAL HIGH (ref 65–99)
POTASSIUM: 3.5 mmol/L (ref 3.5–5.1)
SODIUM: 140 mmol/L (ref 135–145)

## 2017-07-11 NOTE — Patient Instructions (Addendum)
  Have blood work done today.   All other Health Maintenance issues reviewed.   All recommended immunizations and age-appropriate screenings are up-to-date or discussed.  No immunizations administered today.   Medications reviewed and updated.  No changes recommended at this time.     Please followup in 6 months

## 2017-07-11 NOTE — Progress Notes (Signed)
Subjective:    Patient ID: Anita Duke, female    DOB: Jan 23, 1941, 76 y.o.   MRN: [id number]  HPI The patient is here for follow up.  CAD, Afib, Hypertension: She is taking her medication daily. She is compliant with a low sodium diet.  She denies chest pain, palpitations, edema, shortness of breath and regular headaches. She is exercising regularly - walking dog.  She does not monitor her blood pressure at home.    Hyperlipidemia: She is taking her medication daily. She is compliant with a low fat/cholesterol diet. She is exercising regularly. She denies myalgias.   Diabetes: She is taking her medication daily as prescribed. She is not compliant with a diabetic diet. She is exercising regularly - walks the dog. She does not monitor her sugars. She checks her feet daily and denies foot lesions. She is up-to-date with an ophthalmology examination.    Medications and allergies reviewed with patient and updated if appropriate.  Patient Active Problem List   Diagnosis Date Noted  . Long term (current) use of anticoagulants 06/01/2017  . Excessive sweating 07/07/2016  . Osteopenia 01/10/2016  . Encounter for therapeutic drug monitoring 10/08/2015  . Heat intolerance 10/08/2015  . Obesity 12/02/2014  . CAD (coronary artery disease) 05/28/2014  . Vitamin D deficiency 02/20/2014  . Long term current use of anticoagulant therapy 11/24/2013  . Chronic systolic dysfunction of left ventricle 05/21/2013  . 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 Acquired complete AV block   . Sick sinus syndrome ([place])   . Pacemaker-Medtronic 05/20/2012  . Allergic rhinitis 04/25/2012  . Mild intermittent asthma 02/16/2011  . Ejection fraction < 50%   . Atrial fibrillation ([place])   . Mitral regurgitation   . Right ventricular dysfunction   . Mitral stenosis   . S/P mitral valve repair   . Hx of CABG   . Diabetes type II 03/02/2009  . Hyperlipidemia 07/07/2008  . Essential  hypertension 07/07/2008  . Diverticulosis of large intestine 08/04/2002    Current Outpatient Medications on File Prior to Visit  Medication Sig Dispense Refill  . ACCU-CHEK AVIVA PLUS test strip USE 2 TIMES DAILY AS  DIRECTED 200 each 2  . ACCU-CHEK SOFTCLIX LANCETS lancets USE TO CHECK BLOOD SUGARS  TWICE A DAY 200 each 2  . acetaminophen (TYLENOL) 325 MG tablet Take 650 mg by mouth every 6 (six) hours as needed.    Marland Kitchen amoxicillin (AMOXIL) 500 MG capsule Take 4 tabs prior to dental appointments 30 capsule 0  . atorvastatin (LIPITOR) 40 MG tablet TAKE 1 TABLET BY MOUTH  DAILY 90 tablet 2  . blood glucose meter kit and supplies KIT Dispense based on patient and insurance preference. Check sugars daily. NIDDM 1 each 0  . Blood Glucose Monitoring Suppl (ACCU-CHEK AVIVA PLUS) w/Device KIT Use to check blood sugars daily Dx E11.9 1 kit 0  . calcium carbonate (OS-CAL) 600 MG TABS Take 600 mg by mouth daily.     . carvedilol (COREG) 6.25 MG tablet TAKE 1 TABLET BY MOUTH  TWICE A DAY WITH MEALS 180 tablet 2  . cholecalciferol (VITAMIN D) 1000 UNITS tablet Take 6,000 Units by mouth daily.     . fish oil-omega-3 fatty acids 1000 MG capsule Take 1 g by mouth 2 (two) times daily.     . furosemide (LASIX) 40 MG tablet TAKE ONE-HALF TABLET BY  MOUTH DAILY 45 tablet 2  . JARDIANCE 10 MG TABS tablet TAKE 1  TABLET BY MOUTH  DAILY 90 tablet 1  . loperamide (IMODIUM) 2 MG capsule Take 1 capsule (2 mg total) by mouth daily as needed for diarrhea or loose stools. 30 capsule 10  . loratadine (CLARITIN) 10 MG tablet Take 10 mg by mouth daily.    . potassium chloride SA (K-DUR,KLOR-CON) 20 MEQ tablet TAKE 3 TABLETS BY MOUTH  DAILY 270 tablet 1  . spironolactone (ALDACTONE) 25 MG tablet Take 0.5 tablets (12.5 mg total) by mouth daily. 15 tablet 3  . trandolapril (MAVIK) 4 MG tablet TAKE 1 TABLET BY MOUTH TWO  TIMES DAILY FOR BLOOD  PRESSURE 180 tablet 1  . venlafaxine XR (EFFEXOR-XR) 37.5 MG 24 hr capsule TAKE 1  CAPSULE BY MOUTH  EVERY EVENING 90 capsule 1  . warfarin (COUMADIN) 2.5 MG tablet TAKE AS DIRECTED BY  ANTICOAGULATION CLINIC 100 tablet 1   No current facility-administered medications on file prior to visit.     Past Medical History:  Diagnosis Date  . Anemia    Hemoglobin 10.4, December, 2013  . Arthritis    "not bad; little in my hands; some in my knees" (11/04/2013)  . Asthma   . CAD (coronary artery disease)    LIMA to the LAD at time of mitral valve repair / LIMA atretic,, February, 2011  . Cataracts, bilateral   . Cecal cancer ([place]) 2014  . CHF (congestive heart failure) ([place])   . Colon polyp, hyperplastic   . COPD (chronic obstructive pulmonary disease) ([place])   . Depression   . Diverticulosis of colon   . GERD (gastroesophageal reflux disease)   . Hemorrhoids, internal   . High cholesterol   . HTN (hypertension)   . Mitral regurgitation    a. s/p repair with LAA ligation and MAZE at time of surgery  . Permanent atrial fibrillation ([facility])    a. s/p MAZE b. s/p AVN ablation  . Pulmonary HTN ([place])   . QT prolongation    Tikosyn and Effexor. QT prolonged October 13, 2011, peak is in dose reduced from 500  to -250 twice a day  . Type II diabetes mellitus ([place])     Past Surgical History:  Procedure Laterality Date  . ABDOMINAL HYSTERECTOMY  ?2002  . BREAST LUMPECTOMY Bilateral 1990's  . CATARACT EXTRACTION, BILATERAL  2017  . CHOLECYSTECTOMY  1995  . CORONARY ARTERY BYPASS GRAFT  2001   CABG X1 "at time of mitral valve repair" (12/04/2012  . MAZE  2001   w/ MVR & CABG  . MITRAL VALVE REPAIR  2001   "anterior and posterior leaflets" (12/04/2012)  . TONSILLECTOMY AND ADENOIDECTOMY  ~ 1950    Social History   Socioeconomic History  . Marital status: Married    Spouse name: None  . Number of children: 0  . Years of education: None  . Highest education level: None  Social Needs  . Financial resource strain: None  . Food insecurity - worry: None  . Food insecurity  - inability: None  . Transportation needs - medical: None  . Transportation needs - non-medical: None  Occupational History  . 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 Smoking status: Never Smoker  . Smokeless tobacco: Never Used  Substance and Sexual Activity  . Alcohol use: No  . Drug use: No  . Sexual activity: No    Birth control/protection: Surgical  Other  Topics Concern  . None  Social History Narrative   Does not et regular exercise   Daily caffeine use          Family History  Problem Relation Age of Onset  . Heart disease Father   . Diabetes Father   . Heart disease Sister        x 2  . Diabetes Mother   . Kidney disease Mother   . Thyroid disease Mother   . Pancreatic cancer Maternal Grandfather   . Diabetes Paternal Grandfather   . Diabetes Paternal Grandmother   . Heart disease Paternal Uncle        x 6  . Prostate cancer Paternal Uncle   . Colon cancer Neg Hx   . Colon polyps Neg Hx   . Adrenal disorder Neg Hx     Review of Systems  Constitutional: Negative for chills and fever.  Respiratory: Negative for cough, shortness of breath and wheezing.   Cardiovascular: Negative for chest pain, palpitations and leg swelling.  Neurological: Negative for light-headedness and headaches.       Objective:   Vitals:   07/12/17 0847  BP: 132/74  Pulse: 97  Resp: 16  Temp: 98 F (36.7 C)  SpO2: 97%   Wt Readings from Last 3 Encounters:  07/12/17 159 lb (72.1 kg)  06/20/17 161 lb (73 kg)  06/18/17 160 lb (72.6 kg)   Body mass index is 28.17 kg/m.   Physical Exam    Constitutional: Appears well-developed and well-nourished. No distress.  HENT:  Head: Normocephalic and atraumatic.  Neck: Neck supple. No tracheal deviation present. No thyromegaly present.  No cervical lymphadenopathy Cardiovascular: Normal rate, regular rhythm and normal heart sounds.   No  murmur heard. No carotid bruit .  No edema Pulmonary/Chest: Effort normal and breath sounds normal. No respiratory distress. No has no wheezes. No rales.  Skin: Skin is warm and dry. Not diaphoretic.  Psychiatric: Normal mood and affect. Behavior is normal.      Assessment & Plan:    See Problem List for Assessment and Plan of chronic medical problems.

## 2017-07-12 ENCOUNTER — Encounter: Payer: Self-pay | Admitting: Internal Medicine

## 2017-07-12 ENCOUNTER — Other Ambulatory Visit (INDEPENDENT_AMBULATORY_CARE_PROVIDER_SITE_OTHER): Payer: Medicare Other

## 2017-07-12 ENCOUNTER — Ambulatory Visit: Payer: Medicare Other | Admitting: Internal Medicine

## 2017-07-12 VITALS — BP 132/74 | HR 97 | Temp 98.0°F | Resp 16 | Ht 63.0 in | Wt 159.0 lb

## 2017-07-12 DIAGNOSIS — E782 Mixed hyperlipidemia: Secondary | ICD-10-CM

## 2017-07-12 DIAGNOSIS — I4891 Unspecified atrial fibrillation: Secondary | ICD-10-CM | POA: Diagnosis not present

## 2017-07-12 DIAGNOSIS — E1159 Type 2 diabetes mellitus with other circulatory complications: Secondary | ICD-10-CM

## 2017-07-12 DIAGNOSIS — I2510 Atherosclerotic heart disease of native coronary artery without angina pectoris: Secondary | ICD-10-CM

## 2017-07-12 DIAGNOSIS — I10 Essential (primary) hypertension: Secondary | ICD-10-CM | POA: Diagnosis not present

## 2017-07-12 LAB — HEMOGLOBIN A1C: HEMOGLOBIN A1C: 7.3 % — AB (ref 4.6–6.5)

## 2017-07-12 LAB — TSH: TSH: 0.57 u[IU]/mL (ref 0.35–4.50)

## 2017-07-12 NOTE — Assessment & Plan Note (Signed)
Check a1c Low sugar / carb diet Stressed regular exercise   

## 2017-07-12 NOTE — Assessment & Plan Note (Signed)
Lipids well controlled  Continue statin 

## 2017-07-12 NOTE — Assessment & Plan Note (Signed)
Following with cardiology asymptomatic continue current medication

## 2017-07-12 NOTE — Assessment & Plan Note (Signed)
No chest pain, palpitations, sob Continue current medication

## 2017-07-12 NOTE — Assessment & Plan Note (Signed)
BP well controlled Current regimen effective and well tolerated Continue current medications at current doses cmp  

## 2017-07-16 ENCOUNTER — Ambulatory Visit (INDEPENDENT_AMBULATORY_CARE_PROVIDER_SITE_OTHER): Payer: Medicare Other | Admitting: *Deleted

## 2017-07-16 ENCOUNTER — Other Ambulatory Visit: Payer: Self-pay | Admitting: Internal Medicine

## 2017-07-16 DIAGNOSIS — I442 Atrioventricular block, complete: Secondary | ICD-10-CM

## 2017-07-16 DIAGNOSIS — Z950 Presence of cardiac pacemaker: Secondary | ICD-10-CM

## 2017-07-16 DIAGNOSIS — I5022 Chronic systolic (congestive) heart failure: Secondary | ICD-10-CM | POA: Diagnosis not present

## 2017-07-16 NOTE — Progress Notes (Signed)
Remote pacemaker transmission.   

## 2017-07-16 NOTE — Progress Notes (Signed)
EPIC Encounter for ICM Monitoring  Patient Name: Anita Duke is a 76 y.o. female Date: 07/16/2017 Primary Care Physican: Binnie Rail, MD Primary [place] Electrophysiologist: Allred Dry Weight:unknown Bi-V Pacing: 98%       Attempted call to patient and unable to reach.  Left detailed message regarding transmission.  Transmission reviewed.    Corvue: Thoracic impedance normal but was abnormal suggesting fluid accumulation from 06/26/2017 to 07/05/2017.  Prescribed dosage: Furosemide 40 mg 0.5 tablet (20 mg total) daily. Potassium 20 mEq 3 tablets daily.  Labs: 07/04/2017 Creatinine 0.59, BUN 15, Potassium 3.5, Sodium 140, EGFR >60 01/09/2017 Creatinine 0.62, BUN 19, Potassium 4.7, Sodium 139 07/07/2016 Creatinine 0.57, BUN 15, Potassium 3.8, Sodium 142, EGFR 109.46  01/05/2016 Creatinine 0.60, BUN 10, Potassium 4.3, Sodium 142, EGFR 103.31  11/30/2015 Creatinine 0.66, BUN 14, Potassium 3.8, Sodium 140   Recommendations: Left voice mail with ICM number and encouraged to call if experiencing any fluid symptoms.  Follow-up plan: ICM clinic phone appointment on 08/16/2017.   Copy of ICM check sent to Dr. Rayann Heman.   3 month ICM trend: 07/16/2017    1 Year ICM trend:       [name], RN 07/16/2017 4:25 PM

## 2017-07-18 ENCOUNTER — Ambulatory Visit: Payer: Medicare Other | Admitting: General Practice

## 2017-07-18 DIAGNOSIS — Z7901 Long term (current) use of anticoagulants: Secondary | ICD-10-CM

## 2017-07-18 DIAGNOSIS — I4891 Unspecified atrial fibrillation: Secondary | ICD-10-CM

## 2017-07-18 LAB — CUP PACEART REMOTE DEVICE CHECK
Battery Voltage: 2.96 V
Implantable Lead Implant Date: 20150303
Implantable Lead Location: 753858
Implantable Lead Location: 753860
Implantable Lead Model: 5076
Implantable Lead Model: 5092
Implantable Pulse Generator Implant Date: 20150303
Lead Channel Impedance Value: 460 Ohm
Lead Channel Pacing Threshold Amplitude: 1.625 V
Lead Channel Pacing Threshold Pulse Width: 0.6 ms
Lead Channel Sensing Intrinsic Amplitude: 6.7 mV
Lead Channel Setting Pacing Pulse Width: 0.4 ms
Lead Channel Setting Pacing Pulse Width: 0.6 ms
MDC IDC LEAD IMPLANT DT: 20130913
MDC IDC LEAD IMPLANT DT: 20130913
MDC IDC LEAD LOCATION: 753859
MDC IDC MSMT BATTERY REMAINING LONGEVITY: 86 mo
MDC IDC MSMT BATTERY REMAINING PERCENTAGE: 95.5 %
MDC IDC MSMT LEADCHNL LV IMPEDANCE VALUE: 400 Ohm
MDC IDC MSMT LEADCHNL RV PACING THRESHOLD AMPLITUDE: 0.875 V
MDC IDC MSMT LEADCHNL RV PACING THRESHOLD PULSEWIDTH: 0.4 ms
MDC IDC PG SERIAL: 7548835
MDC IDC SESS DTM: 20181112110319
MDC IDC SET LEADCHNL LV PACING AMPLITUDE: 2.625
MDC IDC SET LEADCHNL RV PACING AMPLITUDE: 2 V
MDC IDC SET LEADCHNL RV SENSING SENSITIVITY: 3 mV

## 2017-07-18 LAB — POCT INR: INR: 2.1

## 2017-07-18 MED FILL — SPIRONOLACTONE 25 MG TABLET: 25 | 30 days supply | Qty: 15 | Fill #1

## 2017-07-18 NOTE — Patient Instructions (Addendum)
Pre visit review using our clinic review tool, if applicable. No additional management support is needed unless otherwise documented below in the visit note.  Continue to take 1 tablet daily except 2 tablets on Wednesdays.  Re-check in 4 weeks.  

## 2017-07-20 ENCOUNTER — Encounter: Payer: Self-pay | Admitting: Cardiology

## 2017-07-20 NOTE — Progress Notes (Signed)
Agree with management.  Amadi Yoshino J Javin Nong, MD  

## 2017-08-17 ENCOUNTER — Ambulatory Visit (INDEPENDENT_AMBULATORY_CARE_PROVIDER_SITE_OTHER): Payer: Medicare Other

## 2017-08-17 DIAGNOSIS — I5022 Chronic systolic (congestive) heart failure: Secondary | ICD-10-CM

## 2017-08-17 DIAGNOSIS — Z950 Presence of cardiac pacemaker: Secondary | ICD-10-CM

## 2017-08-17 MED FILL — SPIRONOLACTONE 25 MG TABLET: 25 | 30 days supply | Qty: 15 | Fill #2

## 2017-08-17 NOTE — Progress Notes (Signed)
EPIC Encounter for ICM Monitoring  Patient Name: Anita Duke is a 76 y.o. female Date: 08/17/2017 Primary Care Physican: Binnie Rail, MD Primary [place] Electrophysiologist: Allred Dry Weight:unknown Bi-V Pacing: 98%          Attempted call to patient and unable to reach.  Left detailed message regarding transmission.  Transmission reviewed.    Corvue: Thoracic impedance abnormal suggesting fluid accumulation since 08/08/2017.  Prescribed dosage: Furosemide 40 mg 0.5 tablet (20 mg total) daily. Potassium 20 mEq 3 tablets daily.  Labs: 07/04/2017 Creatinine 0.59, BUN 15, Potassium 3.5, Sodium 140, EGFR >60 01/09/2017 Creatinine 0.62, BUN 19, Potassium 4.7, Sodium 139 07/07/2016 Creatinine 0.57, BUN 15, Potassium 3.8, Sodium 142, EGFR 109.46  01/05/2016 Creatinine 0.60, BUN 10, Potassium 4.3, Sodium 142, EGFR 103.31  11/30/2015 Creatinine 0.66, BUN 14, Potassium 3.8, Sodium 140  Recommendations: Left voice mail with ICM number and encouraged to call if experiencing any fluid symptoms.  Follow-up plan: ICM clinic phone appointment on 08/27/2017.   Copy of ICM check sent to Dr. Rayann Heman and Dr. Irish Lack.   3 month ICM trend: 08/16/2017    1 Year ICM trend:       [name], RN 08/17/2017 2:11 PM

## 2017-08-24 ENCOUNTER — Ambulatory Visit: Payer: Medicare Other | Admitting: General Practice

## 2017-08-24 DIAGNOSIS — I4891 Unspecified atrial fibrillation: Secondary | ICD-10-CM

## 2017-08-24 DIAGNOSIS — Z7901 Long term (current) use of anticoagulants: Secondary | ICD-10-CM | POA: Diagnosis not present

## 2017-08-24 LAB — POCT INR: INR: 2.1

## 2017-08-24 NOTE — Patient Instructions (Signed)
Pre visit review using our clinic review tool, if applicable. No additional management support is needed unless otherwise documented below in the visit note.  Continue to take 1 tablet daily except 2 tablets on Wednesdays.  Re-check in 4 weeks.  

## 2017-08-26 NOTE — Progress Notes (Signed)
Agree.  Anita Duke J Shyrl Obi, MD  

## 2017-08-27 ENCOUNTER — Ambulatory Visit (INDEPENDENT_AMBULATORY_CARE_PROVIDER_SITE_OTHER): Payer: Self-pay

## 2017-08-27 DIAGNOSIS — Z950 Presence of cardiac pacemaker: Secondary | ICD-10-CM

## 2017-08-27 DIAGNOSIS — I5022 Chronic systolic (congestive) heart failure: Secondary | ICD-10-CM

## 2017-08-27 NOTE — Progress Notes (Signed)
EPIC Encounter for ICM Monitoring  Patient Name: Anita Duke is a 76 y.o. female Date: 08/27/2017 Primary Care Physican: Binnie Rail, MD Primary [place] Electrophysiologist: Allred Dry Weight:unknown Bi-V Pacing: 98%        Transmission reviewed   Thoracic impedance returned to normal since last ICM transmission.  Prescribed dosage: Furosemide 40 mg 0.5 tablet (20 mg total) daily. Potassium 20 mEq 3 tablets daily.  Labs: 07/04/2017 Creatinine 0.59, BUN 15, Potassium 3.5, Sodium 140, EGFR >60 01/09/2017 Creatinine 0.62, BUN 19, Potassium 4.7, Sodium 139 07/07/2016 Creatinine 0.57, BUN 15, Potassium 3.8, Sodium 142, EGFR 109.46  01/05/2016 Creatinine 0.60, BUN 10, Potassium 4.3, Sodium 142, EGFR 103.31  11/30/2015 Creatinine 0.66, BUN 14, Potassium 3.8, Sodium 140  Recommendations: None  Follow-up plan: ICM clinic phone appointment on 09/18/2017.  Office appointment scheduled 09/18/2017 with Dr. Aundra Dubin.  Copy of ICM check sent to Dr. Rayann Heman.   3 month ICM trend: 08/27/2017    1 Year ICM trend:       [name], RN 08/27/2017 8:45 AM

## 2017-08-30 ENCOUNTER — Other Ambulatory Visit: Payer: Self-pay | Admitting: Emergency Medicine

## 2017-08-30 MED ORDER — TRANDOLAPRIL 4 MG PO TABS: ORAL_TABLET | ORAL | 1 refills | Status: DC

## 2017-09-14 MED FILL — SPIRONOLACTONE 25 MG TABLET: 25 | 30 days supply | Qty: 15 | Fill #3

## 2017-09-18 ENCOUNTER — Ambulatory Visit (HOSPITAL_COMMUNITY)
Admission: RE | Admit: 2017-09-18 | Discharge: 2017-09-18 | Disposition: A | Discharge status: Home / Self Care | Payer: Medicare Other | Source: Ambulatory Visit | Attending: Cardiology | Admitting: Cardiology

## 2017-09-18 ENCOUNTER — Other Ambulatory Visit: Payer: Self-pay

## 2017-09-18 ENCOUNTER — Encounter (HOSPITAL_COMMUNITY): Payer: Self-pay | Admitting: Cardiology

## 2017-09-18 VITALS — BP 145/61 | HR 83 | Wt 159.0 lb

## 2017-09-18 DIAGNOSIS — I482 Chronic atrial fibrillation: Secondary | ICD-10-CM | POA: Diagnosis not present

## 2017-09-18 DIAGNOSIS — R0602 Shortness of breath: Secondary | ICD-10-CM | POA: Diagnosis not present

## 2017-09-18 DIAGNOSIS — E119 Type 2 diabetes mellitus without complications: Secondary | ICD-10-CM | POA: Insufficient documentation

## 2017-09-18 DIAGNOSIS — Z9889 Other specified postprocedural states: Secondary | ICD-10-CM | POA: Insufficient documentation

## 2017-09-18 DIAGNOSIS — I2510 Atherosclerotic heart disease of native coronary artery without angina pectoris: Secondary | ICD-10-CM | POA: Diagnosis not present

## 2017-09-18 DIAGNOSIS — Z833 Family history of diabetes mellitus: Secondary | ICD-10-CM | POA: Diagnosis not present

## 2017-09-18 DIAGNOSIS — J449 Chronic obstructive pulmonary disease, unspecified: Secondary | ICD-10-CM | POA: Insufficient documentation

## 2017-09-18 DIAGNOSIS — Z952 Presence of prosthetic heart valve: Secondary | ICD-10-CM | POA: Insufficient documentation

## 2017-09-18 DIAGNOSIS — Z950 Presence of cardiac pacemaker: Secondary | ICD-10-CM | POA: Diagnosis not present

## 2017-09-18 DIAGNOSIS — I4821 Permanent atrial fibrillation: Secondary | ICD-10-CM

## 2017-09-18 DIAGNOSIS — Z808 Family history of malignant neoplasm of other organs or systems: Secondary | ICD-10-CM | POA: Diagnosis not present

## 2017-09-18 DIAGNOSIS — I442 Atrioventricular block, complete: Secondary | ICD-10-CM | POA: Diagnosis not present

## 2017-09-18 DIAGNOSIS — Z79899 Other long term (current) drug therapy: Secondary | ICD-10-CM | POA: Insufficient documentation

## 2017-09-18 DIAGNOSIS — Z85038 Personal history of other malignant neoplasm of large intestine: Secondary | ICD-10-CM | POA: Diagnosis not present

## 2017-09-18 DIAGNOSIS — I5022 Chronic systolic (congestive) heart failure: Secondary | ICD-10-CM | POA: Insufficient documentation

## 2017-09-18 DIAGNOSIS — Z8042 Family history of malignant neoplasm of prostate: Secondary | ICD-10-CM | POA: Insufficient documentation

## 2017-09-18 LAB — BASIC METABOLIC PANEL
ANION GAP: 10 (ref 5–15)
BUN: 16 mg/dL (ref 6–20)
CO2: 24 mmol/L (ref 22–32)
Calcium: 9.2 mg/dL (ref 8.9–10.3)
Chloride: 105 mmol/L (ref 101–111)
Creatinine, Ser: 0.64 mg/dL (ref 0.44–1.00)
Glucose, Bld: 120 mg/dL — ABNORMAL HIGH (ref 65–99)
POTASSIUM: 3.7 mmol/L (ref 3.5–5.1)
SODIUM: 139 mmol/L (ref 135–145)

## 2017-09-18 NOTE — Patient Instructions (Signed)
Labs drawn today (if we do not call you, then your lab work was stable)   Your physician has recommended that you have a pulmonary function test. Pulmonary Function Tests are a group of tests that measure how well air moves in and out of your lungs.  Your physician recommends that you schedule a follow-up appointment in: 4 months Dr. Aundra Dubin  (we will call you)

## 2017-09-19 NOTE — Progress Notes (Signed)
Advanced Heart Failure Clinic Note    Primary Care: Dr. Billey Gosling Primary Cardiologist: Dr. Aundra Dubin EP: Dr. Rayann Heman   HPI: Anita Duke is a 77 year old female with a past medical history of pulmonary HTN, mitral regurgitation s/p mitral valve repair, [facility], at that time she also had a LIMA to LAD graft placed. Also with history of permanent Afib on warfarin s/p AVN ablation. At one time she was on Tikosyn which was stopped for QT prolongation.  She has chronic systolic CHF (EF 57% in 0/1779).  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 Last cath in 2014 with atretic LIMA to LAD but nonobstructive disease in LAD.   Echo in 10/18 showed EF improved to 55-60% with mild to moderate RV dysfunction.   Patient returns for followup of CHF.  She is no longer taking Lasix.  She remains short of breath carrying a load or walking up stairs. No chest pain.  No BRBPR/melena on warfarin.  Weight is down 1 lb. Interestingly, she is said to have COPD but never smoked.   Labs (5/18): K 4.7, creatinine 0.62, LDL 71, HDL 61 Labs (10/18): K 3.5, creatinine 0.59, TSH normal  St Jude device interrogation (personally reviewed): Corevue shows stable thoracic impedance.  98% BiV pacing.   ROS: All systems reviewed and negative except as per HPI.  PMH: 1. Type II diabetes. 2. COPD 3. Depression 4. H/o colon cancer 5. CAD: LIMA-LAD in 4/01 with MV repair.   - LHC (8/14): LIMA-LAD atretic, 50-60% mid LAD stenosis.  6. Mitral regurgitation: S/p MV repair at the [facility] in 4/01.  She also had LAA ligation and MAZE.  7. 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   - St Jude CRT-P device.  - Echo (4/16): EF 40%, mild LV dilation, normal RV size with mild to moderately decreased systolic function, s/p mitral valve repair with mild MR, no significant stenosis.  - Echo (10/18): EF 55-60%, severe LAE, RV with mild to moderate systolic dysfunction,  mild-moderate TR, PASP 35 mmHg.  8. Permanent atrial fibrillation: S/p MAZE in 4/01.  S/p AV nodal ablation in 9/13.     Current Outpatient Medications  Medication Sig Dispense Refill  . ACCU-CHEK AVIVA PLUS test strip USE 2 TIMES DAILY AS  DIRECTED 200 each 2  . ACCU-CHEK SOFTCLIX LANCETS lancets USE TO CHECK BLOOD SUGARS  TWICE A DAY 200 each 2  . acetaminophen (TYLENOL) 325 MG tablet Take 650 mg by mouth every 6 (six) hours as needed.    Marland Kitchen amoxicillin (AMOXIL) 500 MG capsule Take 4 tabs prior to dental appointments 30 capsule 0  . atorvastatin (LIPITOR) 40 MG tablet TAKE 1 TABLET BY MOUTH  DAILY 90 tablet 2  . blood glucose meter kit and supplies KIT Dispense based on patient and insurance preference. Check sugars daily. NIDDM 1 each 0  . Blood Glucose Monitoring Suppl (ACCU-CHEK AVIVA PLUS) w/Device KIT Use to check blood sugars daily Dx E11.9 1 kit 0  . calcium carbonate (OS-CAL) 600 MG TABS Take 600 mg by mouth daily.     . carvedilol (COREG) 6.25 MG tablet TAKE 1 TABLET BY MOUTH  TWICE A DAY WITH MEALS 180 tablet 2  . cholecalciferol (VITAMIN D) 1000 UNITS tablet Take 6,000 Units by mouth daily.     . fish oil-omega-3 fatty acids 1000 MG capsule Take 1 g by mouth 2 (two) times daily.     Marland Kitchen JARDIANCE  10 MG TABS tablet TAKE 1 TABLET BY MOUTH  DAILY 90 tablet 1  . loperamide (IMODIUM) 2 MG capsule Take 1 capsule (2 mg total) by mouth daily as needed for diarrhea or loose stools. 30 capsule 10  . loratadine (CLARITIN) 10 MG tablet Take 10 mg by mouth daily.    . potassium chloride SA (K-DUR,KLOR-CON) 20 MEQ tablet TAKE 3 TABLETS BY MOUTH  DAILY 270 tablet 1  . spironolactone (ALDACTONE) 25 MG tablet Take 0.5 tablets (12.5 mg total) by mouth daily. 15 tablet 3  . trandolapril (MAVIK) 4 MG tablet TAKE 1 TABLET BY MOUTH TWO  TIMES DAILY FOR BLOOD  PRESSURE 180 tablet 1  . venlafaxine XR (EFFEXOR-XR) 37.5 MG 24 hr capsule TAKE 1 CAPSULE BY MOUTH  EVERY EVENING 90 capsule 1  . warfarin  (COUMADIN) 2.5 MG tablet TAKE AS DIRECTED BY  ANTICOAGULATION CLINIC 100 tablet 1   No current facility-administered medications for this encounter.     Allergies  Allergen Reactions  . Demerol [Meperidine] Nausea And Vomiting  . Morphine Other (See Comments)    vomiting  . Januvia [Sitagliptin]     Headaches, did not feel well  . Metformin And Related Other (See Comments)    diarrhea  . Tetanus Toxoid Rash    "years ago"      Social History   Socioeconomic History  . Marital status: Married    Spouse name: Not on file  . Number of children: 0  . Years of education: Not on file  . Highest education level: Not on file  Social Needs  . Financial resource strain: Not on file  . Food insecurity - worry: Not on file  . Food insecurity - inability: Not on file  . Transportation needs - medical: Not on file  . Transportation needs - non-medical: Not on file  Occupational History  . 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 Smoking status: Never Smoker  . Smokeless tobacco: Never Used  Substance and Sexual Activity  . Alcohol use: No  . Drug use: No  . Sexual activity: No    Birth control/protection: Surgical  Other Topics Concern  . Not on file  Social History Narrative   Does not et regular exercise   Daily caffeine use            Family History  Problem Relation Age of Onset  . Heart disease Father   . Diabetes Father   . Heart disease Sister        x 2  . Diabetes Mother   . Kidney disease Mother   . Thyroid disease Mother   . Pancreatic cancer Maternal Grandfather   . Diabetes Paternal Grandfather   . Diabetes Paternal Grandmother   . Heart disease Paternal Uncle        x 6  . Prostate cancer Paternal Uncle   . Colon cancer Neg Hx   . Colon polyps Neg Hx   . Adrenal disorder Neg Hx     Vitals:   09/18/17 1102  BP: (!) 145/61  Pulse: 83  SpO2: 99%    Weight: 159 lb (72.1 kg)     PHYSICAL EXAM: General: NAD Neck: No JVD, no thyromegaly or thyroid nodule.  Lungs: Clear to auscultation bilaterally with normal respiratory effort. CV: Nondisplaced PMI.  Heart regular S1/S2, no S3/S4, no murmur.  No peripheral edema.  No carotid bruit.  Normal pedal pulses.  Abdomen: Soft, nontender, no hepatosplenomegaly, no distention.  Skin: Intact without lesions or rashes.  Neurologic: Alert and oriented x 3.  Psych: Normal affect. Extremities: No clubbing or cyanosis.  HEENT: Normal.   ASSESSMENT & PLAN: 1. Chronic systolic CHF: EF 93% in 5701. NICM most likely.  She has LIMA-LAD that is atretic, but no flow limiting disease in the LAD.  Cardiomyopathy may be due to RV pacing after AV nodal ablation in 9/13. She now has St Jude CRT-P device and EF was up to 55-60% on 10/18 echo.  NYHA class II symptoms.  She is not volume overloaded on exam.    - Continue Coreg 6.25 mg BID.  - Continue trandolapril 4 mg bid.  - She is doing fine off Lasix, not volume overloaded.   - Continue spironolactone 12.5 mg daily.  - BMET today. - She still has exertional dyspnea.  She has been said to have COPD but never smoked.  I will order PFTs.    2. CAD: LIMA-LAD at time of MV surgery in 4/01, but LIMA-LAD atretic on 8/14 cath.  However, at that time her LAD had moderate stenosis that did not appear flow-limiting (50-60%).  - Continue atorvastatin 40 mg daily. Good LDL in 5/18. - No ASA given stable CAD with use of warfarin 3. Complete heart block: She had an AV nodal ablation.  Now s/p CRT-P.   4. Permanent atrial fibrillation: Now s/p AV nodal ablation and BiV pacing. - Continue warfarin for anticoagulation.  5. DM: Continue Jardiance.  6. S/p MV repair: Stable mitral valve repair on 10/18 echo.    Loralie Champagne, MD 09/19/17

## 2017-09-27 ENCOUNTER — Ambulatory Visit (INDEPENDENT_AMBULATORY_CARE_PROVIDER_SITE_OTHER): Payer: Medicare Other

## 2017-09-27 ENCOUNTER — Ambulatory Visit (HOSPITAL_COMMUNITY)
Admission: RE | Admit: 2017-09-27 | Discharge: 2017-09-27 | Disposition: A | Discharge status: Home / Self Care | Payer: Medicare Other | Source: Ambulatory Visit | Attending: Cardiology | Admitting: Cardiology

## 2017-09-27 DIAGNOSIS — I5022 Chronic systolic (congestive) heart failure: Secondary | ICD-10-CM | POA: Diagnosis not present

## 2017-09-27 DIAGNOSIS — J984 Other disorders of lung: Secondary | ICD-10-CM | POA: Insufficient documentation

## 2017-09-27 DIAGNOSIS — Z950 Presence of cardiac pacemaker: Secondary | ICD-10-CM | POA: Diagnosis not present

## 2017-09-27 DIAGNOSIS — R0602 Shortness of breath: Secondary | ICD-10-CM | POA: Diagnosis not present

## 2017-09-27 LAB — PULMONARY FUNCTION TEST
DL/VA % PRED: 88 %
DL/VA: 4 ml/min/mmHg/L
DLCO UNC: 13.67 ml/min/mmHg
DLCO unc % pred: 63 %
FEF 25-75 POST: 1.17 L/s
FEF 25-75 Pre: 1.37 L/sec
FEF2575-%Change-Post: -14 %
FEF2575-%PRED-PRE: 97 %
FEF2575-%Pred-Post: 83 %
FEV1-%Change-Post: -3 %
FEV1-%PRED-POST: 87 %
FEV1-%PRED-PRE: 90 %
FEV1-Post: 1.6 L
FEV1-Pre: 1.66 L
FEV1FVC-%Change-Post: -1 %
FEV1FVC-%Pred-Pre: 101 %
FEV6-%CHANGE-POST: -3 %
FEV6-%PRED-PRE: 95 %
FEV6-%Pred-Post: 91 %
FEV6-POST: 2.14 L
FEV6-Pre: 2.22 L
FEV6FVC-%PRED-POST: 105 %
FEV6FVC-%Pred-Pre: 105 %
FVC-%Change-Post: -1 %
FVC-%PRED-PRE: 90 %
FVC-%Pred-Post: 88 %
FVC-POST: 2.18 L
FVC-Pre: 2.22 L
POST FEV6/FVC RATIO: 100 %
Post FEV1/FVC ratio: 74 %
Pre FEV1/FVC ratio: 75 %
Pre FEV6/FVC Ratio: 100 %
RV % pred: 79 %
RV: 1.78 L
TLC % PRED: 82 %
TLC: 3.93 L

## 2017-09-27 MED ORDER — ALBUTEROL SULFATE (2.5 MG/3ML) 0.083% IN NEBU
2.5000 mg | INHALATION_SOLUTION | Freq: Once | RESPIRATORY_TRACT | Status: AC
  Administered 2017-09-27: 2.5 mg via RESPIRATORY_TRACT

## 2017-09-27 NOTE — Progress Notes (Signed)
EPIC Encounter for ICM Monitoring  Patient Name: Anita Duke is a 77 y.o. female Date: 09/27/2017 Primary Care Physican: Binnie Rail, MD Primary [place] Electrophysiologist: Allred Dry Weight:unknown Bi-V Pacing: 98%       Attempted call to patient and unable to reach.  Left detailed message regarding transmission.  Transmission reviewed.    Thoracic impedance normal but was abnormal suggesting fluid accumulation from 09/08/17 to 09/16/17.  Prescribed dosage: Furosemide 40 mg 0.5 tablet (20 mg total) PRN (per Dr Claris Gladden note 09/18/17 but not in med list). Potassium 20 mEq 3 tablets daily.  Recommendations: Left voice mail with ICM number and encouraged to call if experiencing any fluid symptoms.  Follow-up plan: ICM clinic phone appointment on 10/30/2017.   Copy of ICM check sent to Dr. Rayann Heman.   3 month ICM trend: 09/27/2017    1 Year ICM trend:       [name], RN 09/27/2017 1:34 PM

## 2017-10-05 ENCOUNTER — Ambulatory Visit: Payer: Medicare Other | Admitting: General Practice

## 2017-10-05 DIAGNOSIS — Z7901 Long term (current) use of anticoagulants: Secondary | ICD-10-CM

## 2017-10-05 DIAGNOSIS — I4891 Unspecified atrial fibrillation: Secondary | ICD-10-CM

## 2017-10-05 LAB — POCT INR: INR: 3.4

## 2017-10-05 NOTE — Patient Instructions (Addendum)
Pre visit review using our clinic review tool, if applicable. No additional management support is needed unless otherwise documented below in the visit note.  Skip coumadin today and then continue to take 1 tablet daily except 2 tablets on Wednesdays.  Re-check in 4 weeks.

## 2017-10-05 NOTE — Progress Notes (Signed)
Agree with management.  Larayne Baxley J Cheikh Bramble, MD  

## 2017-10-15 ENCOUNTER — Other Ambulatory Visit (HOSPITAL_COMMUNITY): Payer: Self-pay | Admitting: Cardiology

## 2017-10-15 ENCOUNTER — Ambulatory Visit (INDEPENDENT_AMBULATORY_CARE_PROVIDER_SITE_OTHER): Payer: Medicare Other | Admitting: *Deleted

## 2017-10-15 DIAGNOSIS — Z950 Presence of cardiac pacemaker: Secondary | ICD-10-CM

## 2017-10-15 DIAGNOSIS — I5022 Chronic systolic (congestive) heart failure: Secondary | ICD-10-CM | POA: Diagnosis not present

## 2017-10-15 DIAGNOSIS — I442 Atrioventricular block, complete: Secondary | ICD-10-CM

## 2017-10-15 MED FILL — SPIRONOLACTONE 25 MG TABLET: 25 | 30 days supply | Qty: 15 | Fill #0

## 2017-10-16 ENCOUNTER — Telehealth: Payer: Self-pay

## 2017-10-16 NOTE — Progress Notes (Signed)
Remote pacemaker transmission.   

## 2017-10-16 NOTE — Telephone Encounter (Signed)
Remote ICM transmission received.  Attempted call to patient and left detailed message per DPR regarding transmission and next ICM scheduled for 11/15/2017.  Advised to return call for any fluid symptoms or questions.

## 2017-10-16 NOTE — Progress Notes (Signed)
EPIC Encounter for ICM Monitoring  Patient Name: Anita Duke is a 77 y.o. female Date: 10/16/2017 Primary Care Physican: Binnie Rail, MD Primary [place] Electrophysiologist: Allred Dry Weight:unknown Bi-V Pacing: 98%       Attempted call to patient and unable to reach.  Left detailed message regarding transmission.  Transmission reviewed.    Thoracic impedance normal.  Prescribed dosage: Furosemide 40 mg 0.5 tablet (20 mg total) PRN (per Dr Claris Gladden note 09/18/17 but not in med list). Potassium 20 mEq 3 tablets daily.  Recommendations: Left voice mail with ICM number and encouraged to call if experiencing any fluid symptoms.  Follow-up plan: ICM clinic phone appointment on 11/15/2017.    Copy of ICM check sent to Dr. Rayann Heman.   3 month ICM trend: 10/15/2017    1 Year ICM trend:       [name], RN 10/16/2017 12:15 PM

## 2017-10-17 ENCOUNTER — Encounter: Payer: Self-pay | Admitting: Cardiology

## 2017-10-19 ENCOUNTER — Other Ambulatory Visit: Payer: Self-pay | Admitting: Internal Medicine

## 2017-10-19 DIAGNOSIS — Z1231 Encounter for screening mammogram for malignant neoplasm of breast: Secondary | ICD-10-CM

## 2017-10-23 LAB — CUP PACEART REMOTE DEVICE CHECK
Battery Remaining Longevity: 94 mo
Battery Remaining Percentage: 95.5 %
Date Time Interrogation Session: 20190211090012
Implantable Lead Implant Date: 20130913
Lead Channel Impedance Value: 410 Ohm
Lead Channel Impedance Value: 480 Ohm
Lead Channel Pacing Threshold Amplitude: 0.75 V
Lead Channel Pacing Threshold Pulse Width: 0.6 ms
Lead Channel Setting Pacing Pulse Width: 0.4 ms
Lead Channel Setting Pacing Pulse Width: 0.6 ms
Lead Channel Setting Sensing Sensitivity: 3 mV
MDC IDC LEAD IMPLANT DT: 20130913
MDC IDC LEAD IMPLANT DT: 20150303
MDC IDC LEAD LOCATION: 753858
MDC IDC LEAD LOCATION: 753859
MDC IDC LEAD LOCATION: 753860
MDC IDC MSMT BATTERY VOLTAGE: 2.98 V
MDC IDC MSMT LEADCHNL LV PACING THRESHOLD AMPLITUDE: 1.5 V
MDC IDC MSMT LEADCHNL RV PACING THRESHOLD PULSEWIDTH: 0.4 ms
MDC IDC MSMT LEADCHNL RV SENSING INTR AMPL: 6.9 mV
MDC IDC PG IMPLANT DT: 20150303
MDC IDC SET LEADCHNL LV PACING AMPLITUDE: 2.5 V
MDC IDC SET LEADCHNL RV PACING AMPLITUDE: 2 V
Pulse Gen Model: 3242
Pulse Gen Serial Number: 7548835

## 2017-10-24 DIAGNOSIS — M545 Low back pain: Secondary | ICD-10-CM | POA: Diagnosis not present

## 2017-10-24 DIAGNOSIS — M4310 Spondylolisthesis, site unspecified: Secondary | ICD-10-CM | POA: Diagnosis not present

## 2017-10-24 MED FILL — predniSONE 10 MG TABS: 10 | 12 days supply | Qty: 21 | Fill #0

## 2017-11-02 ENCOUNTER — Ambulatory Visit: Payer: Medicare Other | Admitting: General Practice

## 2017-11-02 DIAGNOSIS — I4891 Unspecified atrial fibrillation: Secondary | ICD-10-CM | POA: Diagnosis not present

## 2017-11-02 DIAGNOSIS — Z7901 Long term (current) use of anticoagulants: Secondary | ICD-10-CM

## 2017-11-02 LAB — POCT INR: INR: 3.3

## 2017-11-02 NOTE — Patient Instructions (Addendum)
Pre visit review using our clinic review tool, if applicable. No additional management support is needed unless otherwise documented below in the visit note.  Skip coumadin today and then take 1 tablet daily.  Re-check in 3 weeks.

## 2017-11-03 NOTE — Progress Notes (Signed)
Agree with management.  Kdyn Vonbehren J Ailey Wessling, MD  

## 2017-11-09 ENCOUNTER — Other Ambulatory Visit: Payer: Self-pay | Admitting: Internal Medicine

## 2017-11-13 ENCOUNTER — Other Ambulatory Visit: Payer: Self-pay | Admitting: Emergency Medicine

## 2017-11-13 MED ORDER — GLUCOSE BLOOD VI STRP: ORAL_STRIP | 2 refills | Status: DC

## 2017-11-15 ENCOUNTER — Telehealth: Payer: Self-pay

## 2017-11-15 ENCOUNTER — Ambulatory Visit (INDEPENDENT_AMBULATORY_CARE_PROVIDER_SITE_OTHER): Payer: Medicare Other

## 2017-11-15 DIAGNOSIS — I5022 Chronic systolic (congestive) heart failure: Secondary | ICD-10-CM

## 2017-11-15 DIAGNOSIS — Z950 Presence of cardiac pacemaker: Secondary | ICD-10-CM

## 2017-11-15 NOTE — Progress Notes (Signed)
EPIC Encounter for ICM Monitoring  Patient Name: Anita Duke is a 77 y.o. female Date: 11/15/2017 Primary Care Physican: Binnie Rail, MD Primary [facility] Electrophysiologist: Allred Dry Weight:unknown Bi-V Pacing: 98%                                                 Attempted call to patient and unable to reach.  Left detailed message regarding transmission.  Transmission reviewed.    Thoracic impedance normal but was abnormal suggesting fluid accumulation from 10/25/2017 to 11/04/2017 followed by impedance showing dryness on 11/12/2017.  Prescribed dosage: Furosemide 40 mg 0.5 tablet (20 mg total)PRN (per Dr Claris Gladden note 09/18/17 but not in med list). Potassium 20 mEq 3 tablets daily.  Recommendations: Left voice mail with ICM number and encouraged to call if experiencing any fluid symptoms.  Follow-up plan: ICM clinic phone appointment on 12/17/2017.    Copy of ICM check sent to Dr. Rayann Heman.   3 month ICM trend: 11/15/2017    1 Year ICM trend:       [name], RN 11/15/2017 2:05 PM

## 2017-11-15 NOTE — Telephone Encounter (Signed)
Remote ICM transmission received.  Attempted call to patient and left detailed message per DPR regarding transmission and next ICM scheduled for 12/17/2017.  Advised to return call for any fluid symptoms or questions.    

## 2017-11-23 ENCOUNTER — Ambulatory Visit: Payer: Medicare Other | Admitting: General Practice

## 2017-11-23 DIAGNOSIS — Z7901 Long term (current) use of anticoagulants: Secondary | ICD-10-CM | POA: Diagnosis not present

## 2017-11-23 DIAGNOSIS — I4891 Unspecified atrial fibrillation: Secondary | ICD-10-CM | POA: Diagnosis not present

## 2017-11-23 LAB — POCT INR: INR: 2.2

## 2017-11-23 MED FILL — SPIRONOLACTONE 25 MG TABLET: 25 | 30 days supply | Qty: 15 | Fill #1

## 2017-11-23 NOTE — Patient Instructions (Addendum)
Pre visit review using our clinic review tool, if applicable. No additional management support is needed unless otherwise documented below in the visit note.  Continue to take 1 tablet daily.  Re-check in 4 weeks.

## 2017-12-17 ENCOUNTER — Telehealth: Payer: Self-pay | Admitting: Cardiology

## 2017-12-17 NOTE — Telephone Encounter (Signed)
LMOVM reminding pt to send remote transmission.   

## 2017-12-21 NOTE — Progress Notes (Unsigned)
No ICM remote transmission received for 12/17/2017 and next ICM transmission scheduled for 01/14/2018.

## 2017-12-28 ENCOUNTER — Ambulatory Visit: Payer: Medicare Other

## 2017-12-31 ENCOUNTER — Ambulatory Visit: Payer: Medicare Other

## 2018-01-01 ENCOUNTER — Ambulatory Visit: Payer: Medicare Other

## 2018-01-01 ENCOUNTER — Ambulatory Visit: Payer: Medicare Other | Admitting: General Practice

## 2018-01-01 DIAGNOSIS — I4891 Unspecified atrial fibrillation: Secondary | ICD-10-CM | POA: Diagnosis not present

## 2018-01-01 DIAGNOSIS — Z7901 Long term (current) use of anticoagulants: Secondary | ICD-10-CM

## 2018-01-01 LAB — POCT INR: INR: 1.8

## 2018-01-01 NOTE — Patient Instructions (Addendum)
Pre visit review using our clinic review tool, if applicable. No additional management support is needed unless otherwise documented below in the visit note.  Take 2 tablets today (4/30) and then continue to take 1 tablet daily.  Re-check in 4 weeks.

## 2018-01-03 MED FILL — SPIRONOLACTONE 25 MG TABLET: 25 | 30 days supply | Qty: 15 | Fill #2

## 2018-01-03 NOTE — Progress Notes (Signed)
Agree with management.  Anita Duke J Anita Fullman, MD  

## 2018-01-04 ENCOUNTER — Other Ambulatory Visit: Payer: Self-pay | Admitting: Internal Medicine

## 2018-01-08 DIAGNOSIS — C182 Malignant neoplasm of ascending colon: Secondary | ICD-10-CM | POA: Diagnosis not present

## 2018-01-10 ENCOUNTER — Encounter: Payer: Self-pay | Admitting: Internal Medicine

## 2018-01-10 NOTE — Patient Instructions (Addendum)
Test(s) ordered today. Your results will be released to [facility] (or called to you) after review, usually within 72hours after test completion. If any changes need to be made, you will be notified at that same time.  All other Health Maintenance issues reviewed.   All recommended immunizations and age-appropriate screenings are up-to-date or discussed.  No immunizations administered today.   Medications reviewed and updated.  No changes recommended at this time.   Please followup in 6 months   Health Maintenance, Female Adopting a healthy lifestyle and getting preventive care can go a long way to promote health and wellness. Talk with your health care provider about what schedule of regular examinations is right for you. This is a good chance for you to check in with your provider about disease prevention and staying healthy. In between checkups, there are plenty of things you can do on your own. Experts have done a lot of research about which lifestyle changes and preventive measures are most likely to keep you healthy. Ask your health care provider for more information. Weight and diet Eat a healthy diet  Be sure to include plenty of vegetables, fruits, low-fat dairy products, and lean protein.  Do not eat a lot of foods high in solid fats, added sugars, or salt.  Get regular exercise. This is one of the most important things you can do for your health. ? Most adults should exercise for at least 150 minutes each week. The exercise should increase your heart rate and make you sweat (moderate-intensity exercise). ? Most adults should also do strengthening exercises at least twice a week. This is in addition to the moderate-intensity exercise.  Maintain a healthy weight  Body mass index (BMI) is a measurement that can be used to identify possible weight problems. It estimates body fat based on height and weight. Your health care provider can help determine your BMI and help you achieve or  maintain a healthy weight.  For females 40 years of age and older: ? A BMI below 18.5 is considered underweight. ? A BMI of 18.5 to 24.9 is normal. ? A BMI of 25 to 29.9 is considered overweight. ? A BMI of 30 and above is considered obese.  Watch levels of cholesterol and blood lipids  You should start having your blood tested for lipids and cholesterol at 78 years of age, then have this test every 5 years.  You may need to have your cholesterol levels checked more often if: ? Your lipid or cholesterol levels are high. ? You are older than 78 years of age. ? You are at high risk for heart disease.  Cancer screening Lung Cancer  Lung cancer screening is recommended for adults 59-64 years old who are at high risk for lung cancer because of a history of smoking.  A yearly low-dose CT scan of the lungs is recommended for people who: ? Currently smoke. ? Have quit within the past 15 years. ? Have at least a 30-pack-year history of smoking. A pack year is smoking an average of one pack of cigarettes a day for 1 year.  Yearly screening should continue until it has been 15 years since you quit.  Yearly screening should stop if you develop a health problem that would prevent you from having lung cancer treatment.  Breast Cancer  Practice breast self-awareness. This means understanding how your breasts normally appear and feel.  It also means doing regular breast self-exams. Let your health care provider know about any changes,  no matter how small.  If you are in your 20s or 30s, you should have a clinical breast exam (CBE) by a health care provider every 1-3 years as part of a regular health exam.  If you are 40 or older, have a CBE every year. Also consider having a breast X-ray (mammogram) every year.  If you have a family history of breast cancer, talk to your health care provider about genetic screening.  If you are at high risk for breast cancer, talk to your health care  provider about having an MRI and a mammogram every year.  Breast cancer gene (BRCA) assessment is recommended for women who have family members with BRCA-related cancers. BRCA-related cancers include: ? Breast. ? Ovarian. ? Tubal. ? Peritoneal cancers.  Results of the assessment will determine the need for genetic counseling and BRCA1 and BRCA2 testing.  Cervical Cancer Your health care provider may recommend that you be screened regularly for cancer of the pelvic organs (ovaries, uterus, and vagina). This screening involves a pelvic examination, including checking for microscopic changes to the surface of your cervix (Pap test). You may be encouraged to have this screening done every 3 years, beginning at age 21.  For women ages 30-65, health care providers may recommend pelvic exams and Pap testing every 3 years, or they may recommend the Pap and pelvic exam, combined with testing for human papilloma virus (HPV), every 5 years. Some types of HPV increase your risk of cervical cancer. Testing for HPV may also be done on women of any age with unclear Pap test results.  Other health care providers may not recommend any screening for nonpregnant women who are considered low risk for pelvic cancer and who do not have symptoms. Ask your health care provider if a screening pelvic exam is right for you.  If you have had past treatment for cervical cancer or a condition that could lead to cancer, you need Pap tests and screening for cancer for at least 20 years after your treatment. If Pap tests have been discontinued, your risk factors (such as having a new sexual partner) need to be reassessed to determine if screening should resume. Some women have medical problems that increase the chance of getting cervical cancer. In these cases, your health care provider may recommend more frequent screening and Pap tests.  Colorectal Cancer  This type of cancer can be detected and often prevented.  Routine  colorectal cancer screening usually begins at 78 years of age and continues through 78 years of age.  Your health care provider may recommend screening at an earlier age if you have risk factors for colon cancer.  Your health care provider may also recommend using home test kits to check for hidden blood in the stool.  A small camera at the end of a tube can be used to examine your colon directly (sigmoidoscopy or colonoscopy). This is done to check for the earliest forms of colorectal cancer.  Routine screening usually begins at age 50.  Direct examination of the colon should be repeated every 5-10 years through 78 years of age. However, you may need to be screened more often if early forms of precancerous polyps or small growths are found.  Skin Cancer  Check your skin from head to toe regularly.  Tell your health care provider about any new moles or changes in moles, especially if there is a change in a mole's shape or color.  Also tell your health care provider if you   have a mole that is larger than the size of a pencil eraser.  Always use sunscreen. Apply sunscreen liberally and repeatedly throughout the day.  Protect yourself by wearing long sleeves, pants, a wide-brimmed hat, and sunglasses whenever you are outside.  Heart disease, diabetes, and high blood pressure  High blood pressure causes heart disease and increases the risk of stroke. High blood pressure is more likely to develop in: ? People who have blood pressure in the high end of the normal range (130-139/85-89 mm Hg). ? People who are overweight or obese. ? People who are African American.  If you are 24-25 years of age, have your blood pressure checked every 3-5 years. If you are 2 years of age or older, have your blood pressure checked every year. You should have your blood pressure measured twice-once when you are at a hospital or clinic, and once when you are not at a hospital or clinic. Record the average of the  two measurements. To check your blood pressure when you are not at a hospital or clinic, you can use: ? An automated blood pressure machine at a pharmacy. ? A home blood pressure monitor.  If you are between 42 years and 59 years old, ask your health care provider if you should take aspirin to prevent strokes.  Have regular diabetes screenings. This involves taking a blood sample to check your fasting blood sugar level. ? If you are at a normal weight and have a low risk for diabetes, have this test once every three years after 78 years of age. ? If you are overweight and have a high risk for diabetes, consider being tested at a younger age or more often. Preventing infection Hepatitis B  If you have a higher risk for hepatitis B, you should be screened for this virus. You are considered at high risk for hepatitis B if: ? You were born in a country where hepatitis B is common. Ask your health care provider which countries are considered high risk. ? Your parents were born in a high-risk country, and you have not been immunized against hepatitis B (hepatitis B vaccine). ? You have HIV or AIDS. ? You use needles to inject street drugs. ? You live with someone who has hepatitis B. ? You have had sex with someone who has hepatitis B. ? You get hemodialysis treatment. ? You take certain medicines for conditions, including cancer, organ transplantation, and autoimmune conditions.  Hepatitis C  Blood testing is recommended for: ? Everyone born from [birth year] through 1966. ? Anyone with known risk factors for hepatitis C.  Sexually transmitted infections (STIs)  You should be screened for sexually transmitted infections (STIs) including gonorrhea and chlamydia if: ? You are sexually active and are younger than 79 years of age. ? You are older than 78 years of age and your health care provider tells you that you are at risk for this type of infection. ? Your sexual activity has changed since you  were last screened and you are at an increased risk for chlamydia or gonorrhea. Ask your health care provider if you are at risk.  If you do not have HIV, but are at risk, it may be recommended that you take a prescription medicine daily to prevent HIV infection. This is called pre-exposure prophylaxis (PrEP). You are considered at risk if: ? You are sexually active and do not regularly use condoms or know the HIV status of your partner(s). ? You take drugs by injection. ?  You are sexually active with a partner who has HIV.  Talk with your health care provider about whether you are at high risk of being infected with HIV. If you choose to begin PrEP, you should first be tested for HIV. You should then be tested every 3 months for as long as you are taking PrEP. Pregnancy  If you are premenopausal and you may become pregnant, ask your health care provider about preconception counseling.  If you may become pregnant, take 400 to 800 micrograms (mcg) of folic acid every day.  If you want to prevent pregnancy, talk to your health care provider about birth control (contraception). Osteoporosis and menopause  Osteoporosis is a disease in which the bones lose minerals and strength with aging. This can result in serious bone fractures. Your risk for osteoporosis can be identified using a bone density scan.  If you are 65 years of age or older, or if you are at risk for osteoporosis and fractures, ask your health care provider if you should be screened.  Ask your health care provider whether you should take a calcium or vitamin D supplement to lower your risk for osteoporosis.  Menopause may have certain physical symptoms and risks.  Hormone replacement therapy may reduce some of these symptoms and risks. Talk to your health care provider about whether hormone replacement therapy is right for you. Follow these instructions at home:  Schedule regular health, dental, and eye exams.  Stay current  with your immunizations.  Do not use any tobacco products including cigarettes, chewing tobacco, or electronic cigarettes.  If you are pregnant, do not drink alcohol.  If you are breastfeeding, limit how much and how often you drink alcohol.  Limit alcohol intake to no more than 1 drink per day for nonpregnant women. One drink equals 12 ounces of beer, 5 ounces of wine, or 1 ounces of hard liquor.  Do not use street drugs.  Do not share needles.  Ask your health care provider for help if you need support or information about quitting drugs.  Tell your health care provider if you often feel depressed.  Tell your health care provider if you have ever been abused or do not feel safe at home. This information is not intended to replace advice given to you by your health care provider. Make sure you discuss any questions you have with your health care provider. Document Released: 03/06/2011 Document Revised: 01/27/2016 Document Reviewed: 05/25/2015 [employer] Interactive Patient Education  2018 [employer]  

## 2018-01-10 NOTE — Telephone Encounter (Signed)
Can you close this please, Thank you.  °

## 2018-01-10 NOTE — Progress Notes (Signed)
Subjective:    Patient ID: Anita Duke, female    DOB: 11/01/1940, 77 y.o.   MRN: [id number]  HPI She is here for a physical exam.   She still has the excessive sweating and it does affect her quality of life.    Medications and allergies reviewed with patient and updated if appropriate.  Patient Active Problem List   Diagnosis Date Noted  . Long term (current) use of anticoagulants 06/01/2017  . 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 Excessive sweating 07/07/2016  . Osteopenia 01/10/2016  . Encounter for therapeutic drug monitoring 10/08/2015  . Heat intolerance 10/08/2015  . Obesity 12/02/2014  . CAD (coronary artery disease) 05/28/2014  . Vitamin D deficiency 02/20/2014  . Long term current use of anticoagulant therapy 11/24/2013  . Chronic systolic dysfunction of left ventricle 05/21/2013  . 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 Acquired complete AV block   . Sick sinus syndrome ([place])   . Pacemaker-Medtronic 05/20/2012  . Allergic rhinitis 04/25/2012  . Mild intermittent asthma 02/16/2011  . Ejection fraction < 50%   . Atrial fibrillation ([place])   . Mitral regurgitation   . Right ventricular dysfunction   . Mitral stenosis   . S/P mitral valve repair   . Hx of CABG   . Diabetes type II 03/02/2009  . Hyperlipidemia 07/07/2008  . Essential hypertension 07/07/2008  . Diverticulosis of large intestine 08/04/2002    Current Outpatient Medications on File Prior to Visit  Medication Sig Dispense Refill  . ACCU-CHEK SOFTCLIX LANCETS lancets USE TO CHECK BLOOD SUGARS  TWICE A DAY 200 each 2  . acetaminophen (TYLENOL) 325 MG tablet Take 650 mg by mouth every 6 (six) hours as needed.    Marland Kitchen amoxicillin (AMOXIL) 500 MG capsule Take 4 tabs prior to dental appointments 30 capsule 0  . atorvastatin (LIPITOR) 40 MG tablet TAKE 1 TABLET BY MOUTH  DAILY 90 tablet 2  . Blood Glucose Monitoring Suppl (ACCU-CHEK AVIVA PLUS) w/Device KIT Use to check  blood sugars daily Dx E11.9 1 kit 0  . calcium carbonate (OS-CAL) 600 MG TABS Take 600 mg by mouth daily.     . carvedilol (COREG) 6.25 MG tablet TAKE 1 TABLET BY MOUTH  TWICE A DAY WITH MEALS 180 tablet 2  . cholecalciferol (VITAMIN D) 1000 UNITS tablet Take 6,000 Units by mouth daily.     . fish oil-omega-3 fatty acids 1000 MG capsule Take 1 g by mouth 2 (two) times daily.     Marland Kitchen glucose blood (ACCU-CHEK AVIVA PLUS) test strip USE 2 TIMES DAILY AS  DIRECTED 200 each 2  . JARDIANCE 10 MG TABS tablet TAKE 1 TABLET BY MOUTH  DAILY 90 tablet 1  . loperamide (IMODIUM) 2 MG capsule Take 1 capsule (2 mg total) by mouth daily as needed for diarrhea or loose stools. 30 capsule 10  . loratadine (CLARITIN) 10 MG tablet Take 10 mg by mouth daily.    . potassium chloride SA (K-DUR,KLOR-CON) 20 MEQ tablet TAKE 3 TABLETS BY MOUTH  DAILY 270 tablet 1  . spironolactone (ALDACTONE) 25 MG tablet TAKE 1/2 TABLET BY MOUTH DAILY 15 tablet 3  . trandolapril (MAVIK) 4 MG tablet TAKE 1 TABLET BY MOUTH TWO  TIMES DAILY FOR BLOOD  PRESSURE 180 tablet 1  . venlafaxine XR (EFFEXOR-XR) 37.5 MG 24 hr capsule TAKE 1 CAPSULE BY MOUTH  EVERY EVENING 90 capsule 1  . warfarin (COUMADIN) 2.5 MG tablet TAKE AS  DIRECTED BY  ANTICOAGULATION CLINIC 100 tablet 1   No current facility-administered medications on file prior to visit.     Past Medical History:  Diagnosis Date  . Anemia    Hemoglobin 10.4, December, 2013  . Arthritis    "not bad; little in my hands; some in my knees" (11/04/2013)  . Asthma   . CAD (coronary artery disease)    LIMA to the LAD at time of mitral valve repair / LIMA atretic,, February, 2011  . Cataracts, bilateral   . Cecal cancer ([place]) 2014  . CHF (congestive heart failure) ([place])   . Colon polyp, hyperplastic   . COPD (chronic obstructive pulmonary disease) ([place])   . Depression   . Diverticulosis of colon   . GERD (gastroesophageal reflux disease)   . Hemorrhoids, internal   . High cholesterol     . HTN (hypertension)   . Mitral regurgitation    a. s/p repair with LAA ligation and MAZE at time of surgery  . Permanent atrial fibrillation ([facility])    a. s/p MAZE b. s/p AVN ablation  . Pulmonary HTN ([place])   . QT prolongation    Tikosyn and Effexor. QT prolonged October 13, 2011, peak is in dose reduced from 500  to -250 twice a day  . Type II diabetes mellitus ([place])     Past Surgical History:  Procedure Laterality Date  . ABDOMINAL HYSTERECTOMY  ?2002  . AV NODE ABLATION N/A 05/28/2012   Procedure: AV NODE ABLATION;  Surgeon: Thompson Grayer, MD;  [facility];  Service: Cardiovascular;  Laterality: N/A;  . BI-VENTRICULAR PACEMAKER UPGRADE N/A 11/04/2013   upgrade of previously implanted dual chamber pacemaker to STJ Quadra Allure CRTP by Dr Rayann Heman  . BREAST LUMPECTOMY Bilateral 1990's  . CARDIOVERSION  02/08/2012   Procedure: CARDIOVERSION;  Surgeon: Carlena Bjornstad, MD;  Location: [facility];  Service: Cardiovascular;  Laterality: N/A;  . CARDIOVERSION  02/29/2012   Procedure: CARDIOVERSION;  Surgeon: Carlena Bjornstad, MD;  Location: [facility];  Service: Cardiovascular;  Laterality: N/A;  . CARDIOVERSION  05/15/2012   Procedure: CARDIOVERSION;  Surgeon: Carlena Bjornstad, MD;  Location: [facility];  Service: Cardiovascular;  Laterality: N/A;  . CATARACT EXTRACTION, BILATERAL  2017  . CHOLECYSTECTOMY  1995  . CORONARY ARTERY BYPASS GRAFT  2001   CABG X1 "at time of mitral valve repair" (12/04/2012  . DIAGNOSTIC LAPAROSCOPIC LIVER BIOPSY Left 12/03/2012   Procedure: DIAGNOSTIC LAPAROSCOPIC LIVER BIOPSY;  Surgeon: Stark Klein, MD;  Location: [facility];  Service: General;  Laterality: Left;  . LAPAROSCOPIC RIGHT HEMI COLECTOMY  12/03/2012   Procedure: LAPAROSCOPIC RIGHT HEMI COLECTOMY;  Surgeon: Stark Klein, MD;  Location: [facility];  Service: General;;  . MAZE  2001   w/ MVR & CABG  . MITRAL VALVE REPAIR  2001   "anterior and posterior leaflets" (12/04/2012)  . 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 TONSILLECTOMY AND ADENOIDECTOMY  ~ 1950  . UMBILICAL HERNIA REPAIR N/A 12/03/2012   Procedure: HERNIA REPAIR UMBILICAL ADULT;  Surgeon: Stark Klein, MD;  Location: [facility];  Service: General;  Laterality: N/A;    Social History   Socioeconomic History  . Marital status: Married    Spouse name: Not on file  . Number of children: 0  . Years of education: Not on file  . Highest education level: Not on file  Occupational History  . 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 Financial resource strain: Not on file  . Food insecurity:    Worry: Not on file    Inability: Not on file  . Transportation needs:    Medical: Not on file    Non-medical: Not on file  Tobacco Use  . Smoking status: Never Smoker  . Smokeless tobacco: Never Used  Substance and Sexual Activity  . Alcohol use: No  . Drug use: No  . Sexual activity: Never    Birth control/protection: Surgical  Lifestyle  . Physical activity:    Days per week: Not on file    Minutes per session: Not on file  . Stress: Not on file  Relationships  . Social connections:    Talks on phone: Not on file    Gets together: Not on file    Attends religious service: Not on file    Active member of club or organization: Not on file    Attends meetings of clubs or organizations: Not on file    Relationship status: Not on file  Other Topics Concern  . Not on file  Social History Narrative   Does not et regular exercise   Daily caffeine use          Family History  Problem Relation Age of Onset  . Heart disease Father   . Diabetes Father   . Heart disease Sister        x 2  . Diabetes Mother   . Kidney disease Mother   . Thyroid disease Mother   . Pancreatic cancer Maternal Grandfather   . Diabetes Paternal Grandfather   . Diabetes Paternal Grandmother   . Heart disease  Paternal Uncle        x 6  . Prostate cancer Paternal Uncle   . Colon cancer Neg Hx   . Colon polyps Neg Hx   . Adrenal disorder Neg Hx     Review of Systems  Constitutional: Positive for diaphoresis. Negative for chills and fever.  Eyes: Negative for visual disturbance.  Respiratory: Negative for cough, shortness of breath and wheezing.   Cardiovascular: Negative for chest pain, palpitations and leg swelling.  Gastrointestinal: Positive for diarrhea. Negative for abdominal pain, blood in stool, constipation and nausea.       No gerd  Genitourinary: Negative for dysuria and hematuria.  Musculoskeletal: Positive for arthralgias and back pain.  Skin: Negative for color change and rash.  Neurological: Negative for dizziness, light-headedness and headaches.  Psychiatric/Behavioral: Negative for dysphoric mood. The patient is not nervous/anxious.        Objective:   Vitals:   01/11/18 0811  BP: 134/62  Pulse: 79  Resp: 16  Temp: 97.9 F (36.6 C)  SpO2: 97%   Filed Weights   01/11/18 0811  Weight: 152 lb (68.9 kg)   Body mass index is 26.93 kg/m.  Wt Readings from Last 3 Encounters:  01/11/18 152 lb (68.9 kg)  09/18/17 159 lb (72.1 kg)  07/12/17 159 lb (72.1 kg)     Physical Exam Constitutional: She appears well-developed and well-nourished. No distress.  HENT:  Head: Normocephalic and atraumatic.  Right Ear: External ear normal. Normal ear canal and TM Left Ear: External ear normal.  Normal ear canal and TM Mouth/Throat: Oropharynx is clear and moist.  Eyes: Conjunctivae and EOM are normal.  Neck: Neck supple. No tracheal deviation  present. No thyromegaly present.  No carotid bruit  Cardiovascular: Normal rate, regular rhythm and normal heart sounds.   No murmur heard.  No edema. Pulmonary/Chest: Effort normal and breath sounds normal. No respiratory distress. She has no wheezes. She has no rales.  Breast: deferred   Abdominal: Soft. She exhibits no distension.  There is no tenderness.  Lymphadenopathy: She has no cervical adenopathy.  Skin: Skin is warm and dry. She is not diaphoretic.  Psychiatric: She has a normal mood and affect. Her behavior is normal.        Assessment & Plan:   Physical exam: Screening blood work  ordered Immunizations   Discussed shingrix, others up to date Colonoscopy   Up to date  Mammogram    Up to date  Dexa     Up to date  Eye exams   Up to date  EKG       Done 02/2017 Exercise   Walking the dog Weight   Good for age Skin   No concerns Substance abuse   none  See Problem List for Assessment and Plan of chronic medical problems.   Fu in 6 months

## 2018-01-11 ENCOUNTER — Encounter: Payer: Self-pay | Admitting: Internal Medicine

## 2018-01-11 ENCOUNTER — Ambulatory Visit (INDEPENDENT_AMBULATORY_CARE_PROVIDER_SITE_OTHER): Payer: Medicare Other | Admitting: Internal Medicine

## 2018-01-11 ENCOUNTER — Other Ambulatory Visit: Payer: Self-pay | Admitting: Internal Medicine

## 2018-01-11 ENCOUNTER — Other Ambulatory Visit (INDEPENDENT_AMBULATORY_CARE_PROVIDER_SITE_OTHER): Payer: Medicare Other

## 2018-01-11 VITALS — BP 134/62 | HR 79 | Temp 97.9°F | Resp 16 | Ht 63.0 in | Wt 152.0 lb

## 2018-01-11 DIAGNOSIS — I2510 Atherosclerotic heart disease of native coronary artery without angina pectoris: Secondary | ICD-10-CM | POA: Diagnosis not present

## 2018-01-11 DIAGNOSIS — Z0000 Encounter for general adult medical examination without abnormal findings: Secondary | ICD-10-CM

## 2018-01-11 DIAGNOSIS — E1159 Type 2 diabetes mellitus with other circulatory complications: Secondary | ICD-10-CM | POA: Diagnosis not present

## 2018-01-11 DIAGNOSIS — E782 Mixed hyperlipidemia: Secondary | ICD-10-CM | POA: Diagnosis not present

## 2018-01-11 DIAGNOSIS — I10 Essential (primary) hypertension: Secondary | ICD-10-CM

## 2018-01-11 DIAGNOSIS — I4891 Unspecified atrial fibrillation: Secondary | ICD-10-CM | POA: Diagnosis not present

## 2018-01-11 DIAGNOSIS — R61 Generalized hyperhidrosis: Secondary | ICD-10-CM | POA: Diagnosis not present

## 2018-01-11 LAB — COMPREHENSIVE METABOLIC PANEL
ALT: 12 U/L (ref 0–35)
AST: 17 U/L (ref 0–37)
Albumin: 4 g/dL (ref 3.5–5.2)
Alkaline Phosphatase: 68 U/L (ref 39–117)
BILIRUBIN TOTAL: 1.1 mg/dL (ref 0.2–1.2)
BUN: 15 mg/dL (ref 6–23)
CALCIUM: 9.2 mg/dL (ref 8.4–10.5)
CHLORIDE: 105 meq/L (ref 96–112)
CO2: 28 meq/L (ref 19–32)
CREATININE: 0.51 mg/dL (ref 0.40–1.20)
GFR: 123.96 mL/min (ref >60.00)
GLUCOSE: 128 mg/dL — AB (ref 70–99)
Potassium: 3.5 mEq/L (ref 3.5–5.1)
SODIUM: 142 meq/L (ref 135–145)
Total Protein: 6.5 g/dL (ref 6.0–8.3)

## 2018-01-11 LAB — TSH: TSH: 0.62 u[IU]/mL (ref 0.35–4.50)

## 2018-01-11 LAB — CBC WITH DIFFERENTIAL/PLATELET
BASOS PCT: 0.9 % (ref 0.0–3.0)
Basophils Absolute: 0 10*3/uL (ref 0.0–0.1)
EOS ABS: 0.2 10*3/uL (ref 0.0–0.7)
Eosinophils Relative: 2.9 % (ref 0.0–5.0)
HEMATOCRIT: 42.2 % (ref 36.0–46.0)
Hemoglobin: 14.2 g/dL (ref 12.0–15.0)
LYMPHS ABS: 0.9 10*3/uL (ref 0.7–4.0)
LYMPHS PCT: 17.8 % (ref 12.0–46.0)
MCHC: 33.7 g/dL (ref 30.0–36.0)
MCV: 89.8 fl (ref 78.0–100.0)
Monocytes Absolute: 0.6 10*3/uL (ref 0.1–1.0)
Monocytes Relative: 11.6 % (ref 3.0–12.0)
NEUTROS ABS: 3.5 10*3/uL (ref 1.4–7.7)
NEUTROS PCT: 66.8 % (ref 43.0–77.0)
PLATELETS: 201 10*3/uL (ref 150.0–400.0)
RBC: 4.71 Mil/uL (ref 3.87–5.11)
RDW: 14.3 % (ref 11.5–15.5)
WBC: 5.2 10*3/uL (ref 4.0–10.5)

## 2018-01-11 LAB — LIPID PANEL
CHOL/HDL RATIO: 2
Cholesterol: 117 mg/dL (ref 0–200)
HDL: 52.9 mg/dL (ref >39.00)
LDL CALC: 49 mg/dL (ref 0–99)
NONHDL: 64.55
TRIGLYCERIDES: 79 mg/dL (ref 0.0–149.0)
VLDL: 15.8 mg/dL (ref 0.0–40.0)

## 2018-01-11 LAB — HEMOGLOBIN A1C: Hgb A1c MFr Bld: 7.4 % — ABNORMAL HIGH (ref 4.6–6.5)

## 2018-01-11 NOTE — Assessment & Plan Note (Signed)
Check a1c Low sugar / carb diet Stressed regular exercise   

## 2018-01-11 NOTE — Assessment & Plan Note (Signed)
Still having excessive sweating - it does affect the quality of her life Would be interested in seeing endocrine again - will refer again Basic blood work

## 2018-01-11 NOTE — Assessment & Plan Note (Signed)
Check lipid panel  Continue daily statin Regular exercise and healthy diet encouraged  

## 2018-01-11 NOTE — Assessment & Plan Note (Signed)
Rate controlled No symptoms Cbc, cmp

## 2018-01-11 NOTE — Assessment & Plan Note (Signed)
No chest pain, palpitations, sob Continue current medications Cbc, cmp, tsh

## 2018-01-11 NOTE — Assessment & Plan Note (Signed)
BP well controlled Current regimen effective and well tolerated Continue current medications at current doses Cmp, cbc 

## 2018-01-14 ENCOUNTER — Other Ambulatory Visit: Payer: Self-pay | Admitting: Emergency Medicine

## 2018-01-14 ENCOUNTER — Ambulatory Visit (INDEPENDENT_AMBULATORY_CARE_PROVIDER_SITE_OTHER): Payer: Medicare Other | Admitting: *Deleted

## 2018-01-14 DIAGNOSIS — Z950 Presence of cardiac pacemaker: Secondary | ICD-10-CM

## 2018-01-14 DIAGNOSIS — I5022 Chronic systolic (congestive) heart failure: Secondary | ICD-10-CM

## 2018-01-14 DIAGNOSIS — C182 Malignant neoplasm of ascending colon: Secondary | ICD-10-CM | POA: Diagnosis not present

## 2018-01-14 DIAGNOSIS — I495 Sick sinus syndrome: Secondary | ICD-10-CM

## 2018-01-14 MED ORDER — EMPAGLIFLOZIN 25 MG PO TABS: 25.0000 mg | ORAL_TABLET | Freq: Every day | ORAL | 0 refills | Status: DC

## 2018-01-14 NOTE — Progress Notes (Signed)
EPIC Encounter for ICM Monitoring  Patient Name: Anita Duke is a 77 y.o. female Date: 01/14/2018 Primary Care Physican: Binnie Rail, MD Primary Cardiologist:[facility]/[facility] Electrophysiologist: Allred Dry Weight:unknown Bi-V Pacing: 98%       Attempted call to patient and unable to reach.Left detailed message regarding transmission. Transmission reviewed.    Thoracic impedance abnormal suggesting fluid accumulation since 01/05/2018.  Prescribed dosage: Furosemide 40 mg 0.5 tablet (20 mg total)PRN (per Dr Claris Gladden note 09/18/17 but not in med list). Potassium 20 mEq 3 tablets daily.  Recommendations: Left voice mail with ICM number and encouraged to call if experiencing any fluid symptoms.  Follow-up plan: ICM clinic phone appointment on 01/22/2018.  Office appointment scheduled 01/16/2018 with Dr Aundra Dubin and 02/11/2018 with Chanetta Marshall, NP.  Copy of ICM check sent to Dr. Rayann Heman and Dr Aundra Dubin.   3 month ICM trend: 01/14/2018    1 Year ICM trend:       [name], RN 01/14/2018 11:01 AM

## 2018-01-15 ENCOUNTER — Encounter: Payer: Self-pay | Admitting: Nurse Practitioner

## 2018-01-15 NOTE — Progress Notes (Signed)
Remote pacemaker transmission.   

## 2018-01-16 ENCOUNTER — Encounter: Payer: Self-pay | Admitting: Cardiology

## 2018-01-16 ENCOUNTER — Ambulatory Visit (HOSPITAL_COMMUNITY)
Admission: RE | Admit: 2018-01-16 | Discharge: 2018-01-16 | Disposition: A | Discharge status: Home / Self Care | Payer: Medicare Other | Source: Ambulatory Visit | Attending: Cardiology | Admitting: Cardiology

## 2018-01-16 ENCOUNTER — Encounter (HOSPITAL_COMMUNITY): Payer: Self-pay | Admitting: Cardiology

## 2018-01-16 ENCOUNTER — Other Ambulatory Visit: Payer: Self-pay

## 2018-01-16 VITALS — BP 133/70 | HR 72 | Wt 152.8 lb

## 2018-01-16 DIAGNOSIS — Z85038 Personal history of other malignant neoplasm of large intestine: Secondary | ICD-10-CM | POA: Diagnosis not present

## 2018-01-16 DIAGNOSIS — Z7901 Long term (current) use of anticoagulants: Secondary | ICD-10-CM | POA: Insufficient documentation

## 2018-01-16 DIAGNOSIS — I2510 Atherosclerotic heart disease of native coronary artery without angina pectoris: Secondary | ICD-10-CM | POA: Insufficient documentation

## 2018-01-16 DIAGNOSIS — I442 Atrioventricular block, complete: Secondary | ICD-10-CM | POA: Insufficient documentation

## 2018-01-16 DIAGNOSIS — Z79899 Other long term (current) drug therapy: Secondary | ICD-10-CM | POA: Diagnosis not present

## 2018-01-16 DIAGNOSIS — J449 Chronic obstructive pulmonary disease, unspecified: Secondary | ICD-10-CM | POA: Insufficient documentation

## 2018-01-16 DIAGNOSIS — I5022 Chronic systolic (congestive) heart failure: Secondary | ICD-10-CM | POA: Diagnosis not present

## 2018-01-16 DIAGNOSIS — I481 Persistent atrial fibrillation: Secondary | ICD-10-CM

## 2018-01-16 DIAGNOSIS — Z951 Presence of aortocoronary bypass graft: Secondary | ICD-10-CM

## 2018-01-16 DIAGNOSIS — I4819 Other persistent atrial fibrillation: Secondary | ICD-10-CM

## 2018-01-16 DIAGNOSIS — E119 Type 2 diabetes mellitus without complications: Secondary | ICD-10-CM | POA: Insufficient documentation

## 2018-01-16 DIAGNOSIS — I482 Chronic atrial fibrillation: Secondary | ICD-10-CM | POA: Diagnosis not present

## 2018-01-16 DIAGNOSIS — F329 Major depressive disorder, single episode, unspecified: Secondary | ICD-10-CM | POA: Diagnosis not present

## 2018-01-16 DIAGNOSIS — I429 Cardiomyopathy, unspecified: Secondary | ICD-10-CM | POA: Insufficient documentation

## 2018-01-16 DIAGNOSIS — I519 Heart disease, unspecified: Secondary | ICD-10-CM | POA: Diagnosis not present

## 2018-01-16 LAB — CUP PACEART REMOTE DEVICE CHECK
Battery Remaining Longevity: 88 mo
Battery Remaining Percentage: 95.5 %
Date Time Interrogation Session: 20190513082248
Implantable Lead Implant Date: 20130913
Implantable Lead Implant Date: 20130913
Implantable Lead Location: 753858
Lead Channel Impedance Value: 410 Ohm
Lead Channel Impedance Value: 440 Ohm
Lead Channel Pacing Threshold Pulse Width: 0.6 ms
Lead Channel Setting Pacing Pulse Width: 0.4 ms
Lead Channel Setting Sensing Sensitivity: 3 mV
MDC IDC LEAD IMPLANT DT: 20150303
MDC IDC LEAD LOCATION: 753859
MDC IDC LEAD LOCATION: 753860
MDC IDC MSMT BATTERY VOLTAGE: 2.96 V
MDC IDC MSMT LEADCHNL LV PACING THRESHOLD AMPLITUDE: 1.125 V
MDC IDC MSMT LEADCHNL RV PACING THRESHOLD AMPLITUDE: 0.625 V
MDC IDC MSMT LEADCHNL RV PACING THRESHOLD PULSEWIDTH: 0.4 ms
MDC IDC MSMT LEADCHNL RV SENSING INTR AMPL: 7.8 mV
MDC IDC PG IMPLANT DT: 20150303
MDC IDC PG SERIAL: 7548835
MDC IDC SET LEADCHNL LV PACING AMPLITUDE: 2.125
MDC IDC SET LEADCHNL LV PACING PULSEWIDTH: 0.6 ms
MDC IDC SET LEADCHNL RV PACING AMPLITUDE: 2 V

## 2018-01-16 NOTE — Patient Instructions (Addendum)
Take Furosemide 20 mg (1 tab) daily for 2 days  -With an extra 20 meq (1 tab) extra for 2 days   Your physician has requested that you have an echocardiogram. Echocardiography is a painless test that uses sound waves to create images of your heart. It provides your doctor with information about the size and shape of your heart and how well your heart's chambers and valves are working. This procedure takes approximately one hour. There are no restrictions for this procedure.   Your physician recommends that you schedule a follow-up appointment in: 6 months with Dr. Aundra Dubin  an a echocardiogram

## 2018-01-16 NOTE — Progress Notes (Signed)
Advanced Heart Failure Clinic Note    Primary Care: Dr. Billey Gosling Primary Cardiologist: Dr. Aundra Dubin EP: Dr. Rayann Heman   HPI: Ms. Dicenso is a 77 year old female with a past medical history of pulmonary HTN, mitral regurgitation s/p mitral valve repair, MAZE procedure [facility] the [facility] in [facility], at that time she also had a LIMA to LAD graft placed. Also with history of permanent Afib on warfarin s/p AVN ablation. At one time she was on Tikosyn which was stopped for QT prolongation.  She has chronic systolic CHF (EF 62% in 10/6331).  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 Last cath in 2014 with atretic LIMA to LAD but nonobstructive disease in LAD.   Echo in 10/18 showed EF improved to 55-60% with mild to moderate RV dysfunction.   Today she returns for HF follow up. Overall feeling fine. Denies SOB/PND/Orthopnea. SOB carrying groceries.  Appetite ok. Eating high salt foods.No chest pain.  No fever or chills. Weight at home 148-150 pounds. Taking all medications.   Labs (5/18): K 4.7, creatinine 0.62, LDL 71, HDL 61 Labs (10/18): K 3.5, creatinine 0.59, TSH normal Lasb (01/11/2018): K 3.5 Creatinine 0.51  St Jude device interrogation (personally reviewed): Corvue thoracic impedance trending up. 98% BiV pacing.   ROS: All systems reviewed and negative except as per HPI.  PMH: 1. Type II diabetes. 2. COPD 3. Depression 4. H/o colon cancer 5. CAD: LIMA-LAD in 4/01 with MV repair.   - LHC (8/14): LIMA-LAD atretic, 50-60% mid LAD stenosis.  6. Mitral regurgitation: S/p MV repair at the [facility] in 4/01.  She also had LAA ligation and MAZE.  7. 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   - St Jude CRT-P device.  - Echo (4/16): EF 40%, mild LV dilation, normal RV size with mild to moderately decreased systolic function, s/p mitral valve repair with mild MR, no significant stenosis.  - Echo (10/18): EF 55-60%, severe LAE, RV with mild to moderate systolic  dysfunction, mild-moderate TR, PASP 35 mmHg.  8. Permanent atrial fibrillation: S/p MAZE in 4/01.  S/p AV nodal ablation in 9/13.     Current Outpatient Medications  Medication Sig Dispense Refill  . ACCU-CHEK SOFTCLIX LANCETS lancets USE TO CHECK BLOOD SUGARS  TWICE A DAY 200 each 2  . acetaminophen (TYLENOL) 325 MG tablet Take 650 mg by mouth every 6 (six) hours as needed.    Marland Kitchen amoxicillin (AMOXIL) 500 MG capsule Take 4 tabs prior to dental appointments 30 capsule 0  . atorvastatin (LIPITOR) 40 MG tablet TAKE 1 TABLET BY MOUTH  DAILY 90 tablet 2  . Blood Glucose Monitoring Suppl (ACCU-CHEK AVIVA PLUS) w/Device KIT Use to check blood sugars daily Dx E11.9 1 kit 0  . calcium carbonate (OS-CAL) 600 MG TABS Take 600 mg by mouth daily.     . carvedilol (COREG) 6.25 MG tablet TAKE 1 TABLET BY MOUTH  TWICE A DAY WITH MEALS 180 tablet 0  . cholecalciferol (VITAMIN D) 1000 UNITS tablet Take 6,000 Units by mouth daily.     . empagliflozin (JARDIANCE) 25 MG TABS tablet Take 25 mg by mouth daily. 90 mg 0  . fish oil-omega-3 fatty acids 1000 MG capsule Take 1 g by mouth 2 (two) times daily.     Marland Kitchen glucose blood (ACCU-CHEK AVIVA PLUS) test strip USE 2 TIMES DAILY AS  DIRECTED 200 each 2  . loperamide (IMODIUM) 2 MG capsule Take 1 capsule (2 mg total) by mouth  daily as needed for diarrhea or loose stools. 30 capsule 10  . loratadine (CLARITIN) 10 MG tablet Take 10 mg by mouth daily.    . potassium chloride SA (K-DUR,KLOR-CON) 20 MEQ tablet TAKE 3 TABLETS BY MOUTH  DAILY 270 tablet 1  . spironolactone (ALDACTONE) 25 MG tablet TAKE 1/2 TABLET BY MOUTH DAILY 15 tablet 3  . trandolapril (MAVIK) 4 MG tablet TAKE 1 TABLET BY MOUTH TWO  TIMES DAILY FOR BLOOD  PRESSURE 180 tablet 1  . venlafaxine XR (EFFEXOR-XR) 37.5 MG 24 hr capsule TAKE 1 CAPSULE BY MOUTH  EVERY EVENING 90 capsule 1  . warfarin (COUMADIN) 2.5 MG tablet TAKE AS DIRECTED BY  ANTICOAGULATION CLINIC 100 tablet 1   No current facility-administered  medications for this encounter.     Allergies  Allergen Reactions  . Demerol [Meperidine] Nausea And Vomiting  . Morphine Other (See Comments)    vomiting  . Januvia [Sitagliptin]     Headaches, did not feel well  . Metformin And Related Other (See Comments)    diarrhea  . Tetanus Toxoid Rash    "years ago"      Social History   Socioeconomic History  . Marital status: Married    Spouse name: Not on file  . Number of children: 0  . Years of education: Not on file  . Highest education level: Not on file  Occupational History  . 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 Financial resource strain: Not on file  . Food insecurity:    Worry: Not on file    Inability: Not on file  . Transportation needs:    Medical: Not on file    Non-medical: Not on file  Tobacco Use  . Smoking status: Never Smoker  . Smokeless tobacco: Never Used  Substance and Sexual Activity  . Alcohol use: No  . Drug use: No  . Sexual activity: Never    Birth control/protection: Surgical  Lifestyle  . Physical activity:    Days per week: Not on file    Minutes per session: Not on file  . Stress: Not on file  Relationships  . Social connections:    Talks on phone: Not on file    Gets together: Not on file    Attends religious service: Not on file    Active member of club or organization: Not on file    Attends meetings of clubs or organizations: Not on file    Relationship status: Not on file  . Intimate partner violence:    Fear of current or ex partner: Not on file    Emotionally abused: Not on file    Physically abused: Not on file    Forced sexual activity: Not on file  Other Topics Concern  . Not on file  Social History Narrative         Family History  Problem Relation Age of Onset  . Heart disease Father   . Diabetes Father   . Heart disease Sister        x 2  . Diabetes Mother   .  Kidney disease Mother   . Thyroid disease Mother   . Pancreatic cancer Maternal Grandfather   . Diabetes Paternal Grandfather   . Diabetes Paternal Grandmother   . Heart disease Paternal Uncle        x 6  . Prostate cancer Paternal  Uncle   . Colon cancer Neg Hx   . Colon polyps Neg Hx   . Adrenal disorder Neg Hx     Vitals:   01/16/18 0910  BP: 133/70  Pulse: 72  SpO2: 98%  Weight: 152 lb 12 oz (69.3 kg)     PHYSICAL EXAM: General:  Well appearing. No resp difficulty HEENT: normal Neck: supple. JVP 8-9. Carotids 2+ bilat; no bruits. No lymphadenopathy or thryomegaly appreciated. Cor: PMI nondisplaced. Irregular rate & rhythm. No rubs, gallops or murmurs. Lungs: clear Abdomen: soft, nontender, nondistended. No hepatosplenomegaly. No bruits or masses. Good bowel sounds. Extremities: no cyanosis, clubbing, rash, edema Neuro: alert & orientedx3, cranial nerves grossly intact. moves all 4 extremities w/o difficulty. Affect pleasant  ASSESSMENT & PLAN: 1. Chronic systolic CHF: EF 11% in 5520. NICM most likely.  She has LIMA-LAD that is atretic, but no flow limiting disease in the LAD.  Cardiomyopathy may be due to RV pacing after AV nodal ablation in 9/13. She now has St Jude CRT-P device and EF was up to 55-60% on 10/18 echo.   Corvue - elevated fluid >10 days. I discussed corvue.  NYHA II. Volume status mildly elevated.  Instructed to take 20 mg lasix + an extra 20 meq potassium for 2 days.     - Continue Coreg 6.25 mg BID.  - Continue trandolapril 4 mg bid.  - Continue spironolactone 12.5 mg daily.  - I reviewed BMET from 01/11/2018 --> Stable.  2. CAD: LIMA-LAD at time of MV surgery in 4/01, but LIMA-LAD atretic on 8/14 cath.  However, at that time her LAD had moderate stenosis that did not appear flow-limiting (50-60%). -No s/s ischemia  - Continue atorvastatin 40 mg daily. Good LDL in 5/18. - No ASA given stable CAD with use of warfarin 3. Complete heart block: She had an  AV nodal ablation.  Now s/p CRT-P.   4. Permanent atrial fibrillation: Now s/p AV nodal ablation and BiV pacing. - Continue warfarin for anticoagulation.  5. DM: Continue Jardiance.  6. S/p MV repair: Stable mitral valve repair on 10/18 echo.     Follow up in 6 months with an ECHO and Dr Aundra Dubin.  Darrick Grinder, NP 01/16/18

## 2018-01-17 ENCOUNTER — Ambulatory Visit
Admission: RE | Admit: 2018-01-17 | Discharge: 2018-01-17 | Disposition: A | Discharge status: Home / Self Care | Payer: Medicare Other | Source: Ambulatory Visit | Attending: Internal Medicine | Admitting: Internal Medicine

## 2018-01-17 DIAGNOSIS — Z1231 Encounter for screening mammogram for malignant neoplasm of breast: Secondary | ICD-10-CM

## 2018-01-21 ENCOUNTER — Telehealth: Payer: Self-pay

## 2018-01-21 NOTE — Telephone Encounter (Signed)
Late entry for 01/18/2018 Patient left voice mail message that Dr Haroldine Laws instructed her to take Lasix for 2 days because her fluid levels were increased.  She stated she is following his instructions but wanted to let me know.

## 2018-01-22 ENCOUNTER — Ambulatory Visit (INDEPENDENT_AMBULATORY_CARE_PROVIDER_SITE_OTHER): Payer: Self-pay

## 2018-01-22 ENCOUNTER — Telehealth: Payer: Self-pay

## 2018-01-22 DIAGNOSIS — Z950 Presence of cardiac pacemaker: Secondary | ICD-10-CM

## 2018-01-22 DIAGNOSIS — I5022 Chronic systolic (congestive) heart failure: Secondary | ICD-10-CM

## 2018-01-22 NOTE — Progress Notes (Signed)
EPIC Encounter for ICM Monitoring  Patient Name: GEMINI BUNTE is a 77 y.o. female Date: 01/22/2018 Primary Care Physican: Binnie Rail, MD Primary Cardiologist:Varanasi/[place] Electrophysiologist: Allred Dry Weight:unknown Bi-V Pacing: 98%       Attempted call to patient and unable to reach.  Left detailed message, per DPR, regarding transmission.  Transmission reviewed.    Thoracic impedance above baseline suggesting dryness which correlates with Dr Claris Gladden order to take Furosemide x 2 days at 01/16/2018 office visit.   Recommendations: Left voice mail with ICM number and encouraged to call if experiencing any fluid symptoms.  Follow-up plan: ICM clinic phone appointment on 03/14/2018.  Office appointment scheduled 02/11/2018 with Chanetta Marshall, NP.  Copy of ICM check sent to Dr. Rayann Heman and Dr. Aundra Dubin.   3 month ICM trend: 01/22/2018    1 Year ICM trend:       [name], RN 01/22/2018 11:04 AM

## 2018-01-22 NOTE — Telephone Encounter (Signed)
Remote ICM transmission received.  Attempted call to patient and left detailed message, per DPR, regarding transmission and next ICM scheduled for 03/14/2018.  Advised to return call for any fluid symptoms or questions.    

## 2018-02-04 ENCOUNTER — Telehealth: Payer: Self-pay | Admitting: Internal Medicine

## 2018-02-04 MED ORDER — AMOXICILLIN 500 MG PO CAPS: ORAL_CAPSULE | ORAL | 0 refills | Status: DC

## 2018-02-04 MED FILL — AMOXICILLIN 500 MG CAPSULE: 500 | 8 days supply | Qty: 30 | Fill #0

## 2018-02-04 NOTE — Telephone Encounter (Signed)
Copied from [place] #[id number]. Topic: Quick Communication - Rx Refill/Question >> Feb 04, 2018  2:13 PM Neva Seat wrote: amoxicillin (AMOXIL) 500 MG capsule  Has dentist app in the morning.  Needing a refill of 30 on the medication today.   [place], [place] - [address] [place] [address] Phone: [phone] Fax: [phone]

## 2018-02-04 NOTE — Telephone Encounter (Signed)
Spoke with pt to inform.  

## 2018-02-05 ENCOUNTER — Ambulatory Visit: Payer: Medicare Other

## 2018-02-07 MED FILL — SPIRONOLACTONE 25 MG TABLET: 25 | 30 days supply | Qty: 15 | Fill #3

## 2018-02-10 NOTE — Progress Notes (Signed)
Electrophysiology Office Note Date: 02/11/2018  ID:  Anita, Duke 11-04-1940, MRN [id number]  PCP: Binnie Rail, MD Primary Cardiologist: Aundra Dubin Electrophysiologist: Allred  CC: Pacemaker follow-up  Anita Duke is a 77 y.o. female seen today for Dr Rayann Heman.  She presents today for routine electrophysiology followup.  Since last being seen in our clinic, the patient reports doing very well. She is in the process of selling her home and moving to independent living at [facility]. She denies chest pain, palpitations, dyspnea, PND, orthopnea, nausea, vomiting, dizziness, syncope, edema, weight gain, or early satiety.  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 Anemia    Hemoglobin 10.4, December, 2013  . Arthritis    "not bad; little in my hands; some in my knees" (11/04/2013)  . Asthma   . CAD (coronary artery disease)    LIMA to the LAD at time of mitral valve repair / LIMA atretic,, February, 2011  . Cataracts, bilateral   . 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 CHF (congestive heart failure) ([place])   . Colon polyp, hyperplastic   . COPD (chronic obstructive pulmonary disease) ([place])   . Depression   . Diverticulosis of colon   . GERD (gastroesophageal reflux disease)   . Hemorrhoids, internal   . High cholesterol   . HTN (hypertension)   . Mitral regurgitation    a. s/p repair with LAA ligation and MAZE at time of surgery  . Permanent atrial fibrillation (HCC)    a. s/p MAZE b. s/p AVN ablation  . Pulmonary HTN ([place])   . QT prolongation    Tikosyn and Effexor. QT prolonged October 13, 2011, peak is in dose reduced from 500  to -250 twice a day  . Type II diabetes mellitus ([place])    Past Surgical History:  Procedure Laterality Date  . ABDOMINAL HYSTERECTOMY  ?2002  . AV NODE ABLATION N/A 05/28/2012   Procedure: AV NODE ABLATION;  Surgeon: Thompson Grayer, MD;  Location: [facility]  CATH LAB;  Service: Cardiovascular;  Laterality: N/A;  . BI-VENTRICULAR PACEMAKER UPGRADE N/A 11/04/2013   upgrade of previously implanted dual chamber pacemaker to STJ Quadra Allure CRTP by Dr Rayann Heman  . BREAST EXCISIONAL BIOPSY Left over 10 years ago   benign  . CARDIOVERSION  02/08/2012   Procedure: CARDIOVERSION;  Surgeon: Carlena Bjornstad, MD;  Location: [place];  Service: Cardiovascular;  Laterality: N/A;  . CARDIOVERSION  02/29/2012   Procedure: CARDIOVERSION;  Surgeon: Carlena Bjornstad, MD;  Location: [place];  Service: Cardiovascular;  Laterality: N/A;  . CARDIOVERSION  05/15/2012   Procedure: CARDIOVERSION;  Surgeon: Carlena Bjornstad, MD;  Location: [place];  Service: Cardiovascular;  Laterality: N/A;  . CATARACT EXTRACTION, BILATERAL  2017  . CHOLECYSTECTOMY  1995  . CORONARY ARTERY BYPASS GRAFT  2001   CABG X1 "at time of mitral valve repair" (12/04/2012  . DIAGNOSTIC LAPAROSCOPIC LIVER BIOPSY Left 12/03/2012   Procedure: DIAGNOSTIC LAPAROSCOPIC LIVER BIOPSY;  Surgeon: Stark Klein, MD;  Location: [place];  Service: General;  Laterality: Left;  . LAPAROSCOPIC RIGHT HEMI COLECTOMY  12/03/2012   Procedure: LAPAROSCOPIC RIGHT HEMI COLECTOMY;  Surgeon: Stark Klein, MD;  Location: [facility];  Service: General;;  . MAZE  2001   w/ MVR & CABG  . MITRAL VALVE REPAIR  2001   "anterior and posterior leaflets" (12/04/2012)  . PERMANENT PACEMAKER INSERTION  N/A 05/17/2012   MDT Adapta L implanted by Dr Rayann Heman for tachy/brady syndrome  . TONSILLECTOMY AND ADENOIDECTOMY  ~ 1950  . UMBILICAL HERNIA REPAIR N/A 12/03/2012   Procedure: HERNIA REPAIR UMBILICAL ADULT;  Surgeon: Stark Klein, MD;  Location: [facility];  Service: General;  Laterality: N/A;    Current Outpatient Medications  Medication Sig Dispense Refill  . ACCU-CHEK SOFTCLIX LANCETS lancets USE TO CHECK BLOOD SUGARS  TWICE A DAY 200 each 2  . acetaminophen (TYLENOL) 325 MG tablet Take 650 mg by mouth every 6 (six) hours as needed.    Marland Kitchen amoxicillin (AMOXIL)  500 MG capsule Take 4 tabs prior to dental appointments 30 capsule 0  . atorvastatin (LIPITOR) 40 MG tablet TAKE 1 TABLET BY MOUTH  DAILY 90 tablet 2  . Blood Glucose Monitoring Suppl (ACCU-CHEK AVIVA PLUS) w/Device KIT Use to check blood sugars daily Dx E11.9 1 kit 0  . calcium carbonate (OS-CAL) 600 MG TABS Take 600 mg by mouth daily.     . carvedilol (COREG) 6.25 MG tablet TAKE 1 TABLET BY MOUTH  TWICE A DAY WITH MEALS 180 tablet 0  . cholecalciferol (VITAMIN D) 1000 UNITS tablet Take 6,000 Units by mouth daily.     . empagliflozin (JARDIANCE) 25 MG TABS tablet Take 25 mg by mouth daily. 90 mg 0  . fish oil-omega-3 fatty acids 1000 MG capsule Take 1 g by mouth 2 (two) times daily.     Marland Kitchen glucose blood (ACCU-CHEK AVIVA PLUS) test strip USE 2 TIMES DAILY AS  DIRECTED 200 each 2  . loperamide (IMODIUM) 2 MG capsule Take 1 capsule (2 mg total) by mouth daily as needed for diarrhea or loose stools. 30 capsule 10  . loratadine (CLARITIN) 10 MG tablet Take 10 mg by mouth daily.    . potassium chloride SA (K-DUR,KLOR-CON) 20 MEQ tablet TAKE 3 TABLETS BY MOUTH  DAILY 270 tablet 1  . spironolactone (ALDACTONE) 25 MG tablet TAKE 1/2 TABLET BY MOUTH DAILY 15 tablet 3  . trandolapril (MAVIK) 4 MG tablet TAKE 1 TABLET BY MOUTH TWO  TIMES DAILY FOR BLOOD  PRESSURE 180 tablet 1  . venlafaxine XR (EFFEXOR-XR) 37.5 MG 24 hr capsule TAKE 1 CAPSULE BY MOUTH  EVERY EVENING 90 capsule 1  . warfarin (COUMADIN) 2.5 MG tablet TAKE AS DIRECTED BY  ANTICOAGULATION CLINIC 100 tablet 1   No current facility-administered medications for this visit.     Allergies:   Demerol [meperidine]; Morphine; Januvia [sitagliptin]; Metformin and related; and Tetanus toxoid   Social History: Social History   Socioeconomic History  . Marital status: Married    Spouse name: Not on file  . Number of children: 0  . Years of education: Not on file  . Highest education level: Not on file  Occupational History  . Occupation: credit  union    Comment: Nutritional therapist: Museum/gallery exhibitions officer: Anita Duke  . Financial resource strain: Not on file  . Food insecurity:    Worry: Not on file    Inability: Not on file  . Transportation needs:    Medical: Not on file    Non-medical: Not on file  Tobacco Use  . Smoking status: Never Smoker  . Smokeless tobacco: Never Used  Substance and Sexual Activity  . Alcohol use: No  . Drug use: No  . Sexual activity: Never    Birth control/protection: Surgical  Lifestyle  . Physical activity:  Days per week: Not on file    Minutes per session: Not on file  . Stress: Not on file  Relationships  . Social connections:    Talks on phone: Not on file    Gets together: Not on file    Attends religious service: Not on file    Active member of club or organization: Not on file    Attends meetings of clubs or organizations: Not on file    Relationship status: Not on file  . Intimate partner violence:    Fear of current or ex partner: Not on file    Emotionally abused: Not on file    Physically abused: Not on file    Forced sexual activity: Not on file  Other Topics Concern  . Not on file  Social History Narrative       Family History: Family History  Problem Relation Age of Onset  . Heart disease Father   . Diabetes Father   . Heart disease Sister        x 2  . Diabetes Mother   . Kidney disease Mother   . Thyroid disease Mother   . Pancreatic cancer Maternal Grandfather   . Diabetes Paternal Grandfather   . Diabetes Paternal Grandmother   . Heart disease Paternal Uncle        x 6  . Prostate cancer Paternal Uncle   . Colon cancer Neg Hx   . Colon polyps Neg Hx   . Adrenal disorder Neg Hx      Review of Systems: All other systems reviewed and are otherwise negative except as noted above.   Physical Exam: VS:  BP 130/66   Pulse 90   Ht 5' 3"  (1.6 m)   Wt 150 lb (68 kg)   SpO2 99%   BMI 26.57 kg/m  , BMI Body  mass index is 26.57 kg/m.  GEN- The patient is well appearing, alert and oriented x 3 today.   HEENT: normocephalic, atraumatic; sclera clear, conjunctiva pink; hearing intact; oropharynx clear; neck supple  Lungs- Clear to ausculation bilaterally, normal work of breathing.  No wheezes, rales, rhonchi Heart- Regular rate and rhythm (paced) GI- soft, non-tender, non-distended, bowel sounds present  Extremities- no clubbing, cyanosis, or edema  MS- no significant deformity or atrophy Skin- warm and dry, no rash or lesion; PPM pocket well healed Psych- euthymic mood, full affect Neuro- strength and sensation are intact  PPM Interrogation- reviewed in detail today,  See PACEART report  EKG:  EKG is not ordered today.  Recent Labs: 06/18/2017: B Natriuretic Peptide 101.9 01/11/2018: ALT 12; BUN 15; Creatinine, Ser 0.51; Hemoglobin 14.2; Platelets 201.0; Potassium 3.5; Sodium 142; TSH 0.62   Wt Readings from Last 3 Encounters:  02/11/18 150 lb (68 kg)  01/16/18 152 lb 12 oz (69.3 kg)  01/11/18 152 lb (68.9 kg)     Other studies Reviewed: Additional studies/ records that were reviewed today include: Dr Aundra Dubin and Dr Jackalyn Lombard office notes   Assessment and Plan:  1.  Complete heart block Normal PPM function See Pace Art report No changes today  2.  Chronic systolic heart failure EF normalized with CRT Continue current medical therapy Continue follow up in ICM clinic  3.  Permanent atrial fibrillation S/p AVN ablation  Continue Warfarin for CHADS2VASC of 5  4.  CAD No recent ischemic symptoms Continue current therapy    Current medicines are reviewed at length with the patient today.   The patient does not  have concerns regarding her medicines.  The following changes were made today:  none  Labs/ tests ordered today include: none Orders Placed This Encounter  Procedures  . CUP PACEART INCLINIC DEVICE CHECK     Disposition:   Follow up with Delilah Shan, ICM clinic, Dr  Aundra Dubin as scheduled, Dr Rayann Heman 1 year     Signed, Chanetta Marshall, NP 02/11/2018 12:24 PM  [facility] [address] [place] [address] [phone] (office) [phone] (fax)

## 2018-02-11 ENCOUNTER — Encounter: Payer: Self-pay | Admitting: Nurse Practitioner

## 2018-02-11 ENCOUNTER — Ambulatory Visit: Payer: Medicare Other | Admitting: Nurse Practitioner

## 2018-02-11 VITALS — BP 130/66 | HR 90 | Ht 63.0 in | Wt 150.0 lb

## 2018-02-11 DIAGNOSIS — I2510 Atherosclerotic heart disease of native coronary artery without angina pectoris: Secondary | ICD-10-CM | POA: Diagnosis not present

## 2018-02-11 DIAGNOSIS — I482 Chronic atrial fibrillation: Secondary | ICD-10-CM | POA: Diagnosis not present

## 2018-02-11 DIAGNOSIS — I4821 Permanent atrial fibrillation: Secondary | ICD-10-CM

## 2018-02-11 DIAGNOSIS — I442 Atrioventricular block, complete: Secondary | ICD-10-CM

## 2018-02-11 DIAGNOSIS — I5022 Chronic systolic (congestive) heart failure: Secondary | ICD-10-CM

## 2018-02-11 LAB — CUP PACEART INCLINIC DEVICE CHECK
Implantable Lead Implant Date: 20130913
Implantable Lead Implant Date: 20130913
Implantable Lead Location: 753858
Implantable Lead Location: 753859
Implantable Lead Location: 753860
Implantable Lead Model: 5076
MDC IDC LEAD IMPLANT DT: 20150303
MDC IDC PG IMPLANT DT: 20150303
MDC IDC PG SERIAL: 7548835
MDC IDC SESS DTM: 20190610114804
Pulse Gen Model: 3242

## 2018-02-11 NOTE — Patient Instructions (Addendum)
Medication Instructions:   Your physician recommends that you continue on your current medications as directed. Please refer to the Current Medication list given to you today.   If you need a refill on your cardiac medications before your next appointment, please call your pharmacy.  Labwork:  NONE ORDERED  TODAY    Testing/Procedures: NONE ORDERED  TODAY   Follow-Up: Your physician wants you to follow-up in: [place] will receive a reminder letter in the mail two months in advance. If you don't receive a letter, please call our office to schedule the follow-up appointment.   Remote monitoring is used to monitor your Pacemaker of ICD from home. This monitoring reduces the number of office visits required to check your device to one time per year. It allows [name] to keep an eye on the functioning of your device to ensure it is working properly. You are scheduled for a device check from home on .04-15-18 V You may send your transmission at any time that day. If you have a wireless device, the transmission will be sent automatically. After your physician reviews your transmission, you will receive a postcard with your next transmission date.     Any Other Special Instructions Will Be Listed Below (If Applicable).

## 2018-02-12 DIAGNOSIS — E1142 Type 2 diabetes mellitus with diabetic polyneuropathy: Secondary | ICD-10-CM | POA: Diagnosis not present

## 2018-02-15 ENCOUNTER — Encounter: Payer: Medicare Other | Admitting: Nurse Practitioner

## 2018-02-15 ENCOUNTER — Ambulatory Visit: Payer: Medicare Other | Admitting: General Practice

## 2018-02-15 DIAGNOSIS — Z7901 Long term (current) use of anticoagulants: Secondary | ICD-10-CM | POA: Diagnosis not present

## 2018-02-15 DIAGNOSIS — I4891 Unspecified atrial fibrillation: Secondary | ICD-10-CM

## 2018-02-15 LAB — POCT INR: INR: 2.4 (ref 2.0–3.0)

## 2018-02-15 NOTE — Progress Notes (Signed)
Agree with management.  Anita Semel J Lavine Hargrove, MD  

## 2018-02-15 NOTE — Patient Instructions (Addendum)
Pre visit review using our clinic review tool, if applicable. No additional management support is needed unless otherwise documented below in the visit note.  Continue to take 1 tablet daily.  Re-check in 4 weeks.

## 2018-02-21 ENCOUNTER — Encounter: Payer: Self-pay | Admitting: Internal Medicine

## 2018-02-21 DIAGNOSIS — E1140 Type 2 diabetes mellitus with diabetic neuropathy, unspecified: Secondary | ICD-10-CM

## 2018-02-21 HISTORY — DX: Type 2 diabetes mellitus with diabetic neuropathy, unspecified: E11.40

## 2018-02-25 DIAGNOSIS — H26492 Other secondary cataract, left eye: Secondary | ICD-10-CM | POA: Diagnosis not present

## 2018-02-25 DIAGNOSIS — H524 Presbyopia: Secondary | ICD-10-CM | POA: Diagnosis not present

## 2018-02-25 DIAGNOSIS — E119 Type 2 diabetes mellitus without complications: Secondary | ICD-10-CM | POA: Diagnosis not present

## 2018-02-25 LAB — HM DIABETES EYE EXAM

## 2018-02-26 ENCOUNTER — Encounter: Payer: Self-pay | Admitting: Internal Medicine

## 2018-03-04 ENCOUNTER — Other Ambulatory Visit: Payer: Self-pay | Admitting: Internal Medicine

## 2018-03-05 ENCOUNTER — Other Ambulatory Visit (HOSPITAL_COMMUNITY): Payer: Self-pay | Admitting: Cardiology

## 2018-03-06 ENCOUNTER — Other Ambulatory Visit: Payer: Self-pay | Admitting: Interventional Cardiology

## 2018-03-06 MED FILL — SPIRONOLACTONE 25 MG TABS: 25 | 30 days supply | Qty: 15 | Fill #0

## 2018-03-12 ENCOUNTER — Ambulatory Visit: Payer: Medicare Other

## 2018-03-13 DIAGNOSIS — M48061 Spinal stenosis, lumbar region without neurogenic claudication: Secondary | ICD-10-CM | POA: Diagnosis not present

## 2018-03-13 MED FILL — predniSONE 5 MG TABS: 5 | 12 days supply | Qty: 21 | Fill #0

## 2018-03-14 ENCOUNTER — Ambulatory Visit (INDEPENDENT_AMBULATORY_CARE_PROVIDER_SITE_OTHER): Payer: Medicare Other

## 2018-03-14 DIAGNOSIS — I5022 Chronic systolic (congestive) heart failure: Secondary | ICD-10-CM

## 2018-03-14 DIAGNOSIS — Z950 Presence of cardiac pacemaker: Secondary | ICD-10-CM | POA: Diagnosis not present

## 2018-03-15 ENCOUNTER — Telehealth: Payer: Self-pay

## 2018-03-15 ENCOUNTER — Ambulatory Visit: Payer: Medicare Other

## 2018-03-15 NOTE — Progress Notes (Signed)
EPIC Encounter for ICM Monitoring  Patient Name: TAMAR MIANO is a 77 y.o. female Date: 03/15/2018 Primary Care Physican: Binnie Rail, MD Primary Cardiologist:[place] Electrophysiologist: Allred Dry Weight:unknown Bi-V Pacing: 98%      Attempted call to patient and unable to reach.  Left detailed message, per DPR, regarding transmission.  Transmission reviewed.    Thoracic impedance normal but was abnormal suggesting fluid accumulation from 03/08/2018 - 03/13/2018.  Recommendations: Left voice mail with ICM number and encouraged to call if experiencing any fluid symptoms.  Follow-up plan: ICM clinic phone appointment on 04/15/2018.    Copy of ICM check sent to Dr. Rayann Heman.   3 month ICM trend: 03/15/2018    1 Year ICM trend:       [name], RN 03/15/2018 12:47 PM

## 2018-03-15 NOTE — Telephone Encounter (Signed)
Remote ICM transmission received.  Attempted call to patient and left detailed message, per DPR, regarding transmission and next ICM scheduled for 04/15/2018.  Advised to return call for any fluid symptoms or questions.

## 2018-03-21 ENCOUNTER — Telehealth: Payer: Self-pay

## 2018-03-21 NOTE — Telephone Encounter (Signed)
Attempted call back to patient as requested by voice mail message regarding [place] monitor connection.  Advised to send a manual transmission to check connection and provided instructions. Provided [employer] number if needed.

## 2018-03-22 ENCOUNTER — Other Ambulatory Visit: Payer: Self-pay | Admitting: Interventional Cardiology

## 2018-03-22 ENCOUNTER — Ambulatory Visit: Payer: Medicare Other | Admitting: General Practice

## 2018-03-22 ENCOUNTER — Other Ambulatory Visit: Payer: Self-pay | Admitting: Internal Medicine

## 2018-03-22 DIAGNOSIS — I4891 Unspecified atrial fibrillation: Secondary | ICD-10-CM

## 2018-03-22 DIAGNOSIS — Z7901 Long term (current) use of anticoagulants: Secondary | ICD-10-CM

## 2018-03-22 LAB — POCT INR: INR: 2.1 (ref 2.0–3.0)

## 2018-03-22 NOTE — Patient Instructions (Addendum)
Pre visit review using our clinic review tool, if applicable. No additional management support is needed unless otherwise documented below in the visit note.  Continue to take 1 tablet daily.  Re-check in 4 weeks.

## 2018-03-26 ENCOUNTER — Other Ambulatory Visit: Payer: Self-pay | Admitting: General Practice

## 2018-03-26 MED ORDER — WARFARIN SODIUM 2.5 MG PO TABS
ORAL_TABLET | ORAL | 1 refills | Status: DC
Start: 2018-03-26 — End: 2018-07-03

## 2018-03-27 DIAGNOSIS — M48061 Spinal stenosis, lumbar region without neurogenic claudication: Secondary | ICD-10-CM | POA: Insufficient documentation

## 2018-03-28 ENCOUNTER — Other Ambulatory Visit: Payer: Self-pay | Admitting: Orthopedic Surgery

## 2018-03-28 DIAGNOSIS — M48061 Spinal stenosis, lumbar region without neurogenic claudication: Secondary | ICD-10-CM

## 2018-03-29 ENCOUNTER — Telehealth: Payer: Self-pay | Admitting: Nurse Practitioner

## 2018-03-29 NOTE — Telephone Encounter (Signed)
Phone call to patient to verify medication list and allergies for myelogram procedure. Pt informed she will need to stop coumadin 4 days prior to myelogram procedure (pending cardiologist Dr. Claris Gladden approval).  Pt will also need to stop effexor 48 hrs prior to myelogram procedure. Pt verbalized understanding. [place] hold request form sent to Dr. Aundra Dubin, awaiting approval.

## 2018-04-04 ENCOUNTER — Telehealth (HOSPITAL_COMMUNITY): Payer: Self-pay | Admitting: *Deleted

## 2018-04-04 NOTE — Telephone Encounter (Signed)
Received clearance from [facility] for patient to have Myelogram.  Per Dr. Aundra Dubin patient can hold Coumadin 4 days prior to appointment.    Clearance faxed today to [phone].

## 2018-04-09 ENCOUNTER — Other Ambulatory Visit (HOSPITAL_COMMUNITY): Payer: Self-pay

## 2018-04-09 MED ORDER — SPIRONOLACTONE 25 MG PO TABS: 12.5000 mg | ORAL_TABLET | Freq: Every day | ORAL | 3 refills | Status: DC

## 2018-04-15 ENCOUNTER — Telehealth: Payer: Self-pay | Admitting: Cardiology

## 2018-04-15 ENCOUNTER — Encounter: Payer: Medicare Other | Admitting: *Deleted

## 2018-04-15 NOTE — Telephone Encounter (Signed)
LMOVM reminding pt to send remote transmission.   

## 2018-04-16 ENCOUNTER — Ambulatory Visit: Payer: Medicare Other | Admitting: General Practice

## 2018-04-16 ENCOUNTER — Ambulatory Visit: Payer: Medicare Other | Admitting: Endocrinology

## 2018-04-16 DIAGNOSIS — Z7901 Long term (current) use of anticoagulants: Secondary | ICD-10-CM

## 2018-04-16 DIAGNOSIS — I4891 Unspecified atrial fibrillation: Secondary | ICD-10-CM

## 2018-04-16 LAB — POCT INR: INR: 1.3 — AB (ref 2.0–3.0)

## 2018-04-16 NOTE — Patient Instructions (Addendum)
Pre visit review using our clinic review tool, if applicable. No additional management support is needed unless otherwise documented below in the visit note.  Hold coumadin today and tomorrow.  On Thursday and Friday take 2 tablets and then continue to take 1 tablet daily.  Re-check in 1 week.

## 2018-04-17 ENCOUNTER — Encounter: Payer: Self-pay | Admitting: Cardiology

## 2018-04-17 ENCOUNTER — Ambulatory Visit
Admission: RE | Admit: 2018-04-17 | Discharge: 2018-04-17 | Disposition: A | Discharge status: Home / Self Care | Payer: Medicare Other | Source: Ambulatory Visit | Attending: Orthopedic Surgery | Admitting: Orthopedic Surgery

## 2018-04-17 DIAGNOSIS — M48061 Spinal stenosis, lumbar region without neurogenic claudication: Secondary | ICD-10-CM | POA: Diagnosis not present

## 2018-04-17 MED ORDER — DIAZEPAM 5 MG PO TABS
5.0000 mg | ORAL_TABLET | Freq: Once | ORAL | Status: AC
  Administered 2018-04-17: 5 mg via ORAL

## 2018-04-17 MED ORDER — IOPAMIDOL (ISOVUE-M 200) INJECTION 41%
20.0000 mL | Freq: Once | INTRAMUSCULAR | Status: AC
  Administered 2018-04-17: 20 mL via INTRATHECAL

## 2018-04-17 NOTE — Progress Notes (Signed)
Agree with management.  Anita Encarnacion J Rianne Degraaf, MD  

## 2018-04-17 NOTE — Progress Notes (Addendum)
Patient states she has been off Effexor for the past two days and off Coumadin for the past four days. INR in epic from yesterday is 1.3.  Gypsy Lore, RN

## 2018-04-17 NOTE — Discharge Instructions (Signed)
Myelogram Discharge Instructions  1. Go home and rest quietly for the next 24 hours.  It is important to lie flat for the next 24 hours.  Get up only to go to the restroom.  You may lie in the bed or on a couch on your back, your stomach, your left side or your right side.  You may have one pillow under your head.  You may have pillows between your knees while you are on your side or under your knees while you are on your back.  2. DO NOT drive today.  Recline the seat as far back as it will go, while still wearing your seat belt, on the way home.  3. You may get up to go to the bathroom as needed.  You may sit up for 10 minutes to eat.  You may resume your normal diet and medications unless otherwise indicated.  Drink lots of extra fluids today and tomorrow.  4. The incidence of headache, nausea, or vomiting is about 5% (one in 20 patients).  If you develop a headache, lie flat and drink plenty of fluids until the headache goes away.  Caffeinated beverages may be helpful.  If you develop severe nausea and vomiting or a headache that does not go away with flat bed rest, call [phone].  5. You may resume normal activities after your 24 hours of bed rest is over; however, do not exert yourself strongly or do any heavy lifting tomorrow. If when you get up you have a headache when standing, go back to bed and force fluids for another 24 hours.  6. Call your physician for a follow-up appointment.  The results of your myelogram will be sent directly to your physician by the following day.  7. If you have any questions or if complications develop after you arrive home, please call [phone].  Discharge instructions have been explained to the patient.  The patient, or the person responsible for the patient, fully understands these instructions.  YOU MAY RESTART YOUR COUMADIN TODAY. YOU [facility] TOMORROW 04/18/2018 AT 10:30AM.

## 2018-04-18 DIAGNOSIS — H26492 Other secondary cataract, left eye: Secondary | ICD-10-CM | POA: Diagnosis not present

## 2018-04-23 ENCOUNTER — Ambulatory Visit: Payer: Medicare Other

## 2018-04-23 ENCOUNTER — Ambulatory Visit: Payer: Medicare Other | Admitting: General Practice

## 2018-04-23 DIAGNOSIS — I4891 Unspecified atrial fibrillation: Secondary | ICD-10-CM

## 2018-04-23 DIAGNOSIS — B078 Other viral warts: Secondary | ICD-10-CM | POA: Diagnosis not present

## 2018-04-23 DIAGNOSIS — Z7901 Long term (current) use of anticoagulants: Secondary | ICD-10-CM

## 2018-04-23 DIAGNOSIS — L720 Epidermal cyst: Secondary | ICD-10-CM | POA: Diagnosis not present

## 2018-04-23 DIAGNOSIS — M545 Low back pain: Secondary | ICD-10-CM | POA: Diagnosis not present

## 2018-04-23 DIAGNOSIS — D485 Neoplasm of uncertain behavior of skin: Secondary | ICD-10-CM | POA: Diagnosis not present

## 2018-04-23 DIAGNOSIS — L812 Freckles: Secondary | ICD-10-CM | POA: Diagnosis not present

## 2018-04-23 DIAGNOSIS — Z85828 Personal history of other malignant neoplasm of skin: Secondary | ICD-10-CM | POA: Diagnosis not present

## 2018-04-23 DIAGNOSIS — L821 Other seborrheic keratosis: Secondary | ICD-10-CM | POA: Diagnosis not present

## 2018-04-23 DIAGNOSIS — L820 Inflamed seborrheic keratosis: Secondary | ICD-10-CM | POA: Diagnosis not present

## 2018-04-23 LAB — POCT INR: INR: 2.1 (ref 2.0–3.0)

## 2018-04-23 MED FILL — predniSONE 10 MG TABS: 10 | 12 days supply | Qty: 21 | Fill #0

## 2018-04-23 MED FILL — GABAPENTIN 300 MG CAPSULE: 300 | 26 days supply | Qty: 60 | Fill #0

## 2018-04-23 NOTE — Patient Instructions (Addendum)
Pre visit review using our clinic review tool, if applicable. No additional management support is needed unless otherwise documented below in the visit note.  Continue to take 1 tablet daily.  Re-check in 4 weeks.

## 2018-04-24 ENCOUNTER — Ambulatory Visit: Payer: Medicare Other | Admitting: Endocrinology

## 2018-04-24 VITALS — BP 110/58 | HR 51 | Ht 63.0 in | Wt 140.8 lb

## 2018-04-24 DIAGNOSIS — L749 Eccrine sweat disorder, unspecified: Secondary | ICD-10-CM | POA: Diagnosis not present

## 2018-04-24 DIAGNOSIS — E1159 Type 2 diabetes mellitus with other circulatory complications: Secondary | ICD-10-CM | POA: Diagnosis not present

## 2018-04-24 NOTE — Progress Notes (Signed)
Agree with management.  Mariajose Mow J Precilla Purnell, MD  

## 2018-04-24 NOTE — Patient Instructions (Addendum)
Let's recheck the 24-HR urine collection.  A1c was checked here today in error.  Please see Dr Quay Burow to follow this up.   Please try going off the effexor, to see if that helps.  Please let me know if that does not help.  If that is the case, we can try another medication for the symptoms.

## 2018-04-24 NOTE — Progress Notes (Signed)
Subjective:    Patient ID: Anita Duke, female    DOB: Aug 11, 1941, 77 y.o.   MRN: [id number]  HPI  Pt returns for f/u of excessive diaphoresis (started 2014; TFT and urine catechols were normal; adrenals were normal on 2014 CT; no cause of sxs was found).  Pt states few years of severe diaphoresis throughout the body, and assoc slight flushing.  sxs can happen at any time of day.  She takes effexor for hot flashes.   Past Medical History:  Diagnosis Date  . Anemia    Hemoglobin 10.4, December, 2013  . Arthritis    "not bad; little in my hands; some in my knees" (11/04/2013)  . Asthma   . CAD (coronary artery disease)    LIMA to the LAD at time of mitral valve repair / LIMA atretic,, February, 2011  . Cataracts, bilateral   . 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 CHF (congestive heart failure) ([place])   . Colon polyp, hyperplastic   . COPD (chronic obstructive pulmonary disease) ([place])   . Depression   . Diverticulosis of colon   . GERD (gastroesophageal reflux disease)   . Hemorrhoids, internal   . High cholesterol   . HTN (hypertension)   . Mitral regurgitation    a. s/p repair with LAA ligation and MAZE at time of surgery  . Permanent atrial fibrillation (HCC)    a. s/p MAZE b. s/p AVN ablation  . Pulmonary HTN ([place])   . QT prolongation    Tikosyn and Effexor. QT prolonged October 13, 2011, peak is in dose reduced from 500  to -250 twice a day  . Type II diabetes mellitus ([place])     Past Surgical History:  Procedure Laterality Date  . ABDOMINAL HYSTERECTOMY  ?2002  . AV NODE ABLATION N/A 05/28/2012   Procedure: AV NODE ABLATION;  Surgeon: Thompson Grayer, MD;  Location: [facility] CATH LAB;  Service: Cardiovascular;  Laterality: N/A;  . BI-VENTRICULAR PACEMAKER UPGRADE N/A 11/04/2013   upgrade of previously implanted dual chamber pacemaker to STJ Quadra Allure CRTP by Dr Rayann Heman  . BREAST EXCISIONAL BIOPSY Left over 10 years ago   benign  . CARDIOVERSION  02/08/2012   Procedure: CARDIOVERSION;   Surgeon: Carlena Bjornstad, MD;  Location: [facility];  Service: Cardiovascular;  Laterality: N/A;  . CARDIOVERSION  02/29/2012   Procedure: CARDIOVERSION;  Surgeon: Carlena Bjornstad, MD;  Location: [place];  Service: Cardiovascular;  Laterality: N/A;  . CARDIOVERSION  05/15/2012   Procedure: CARDIOVERSION;  Surgeon: Carlena Bjornstad, MD;  Location: [place];  Service: Cardiovascular;  Laterality: N/A;  . CATARACT EXTRACTION, BILATERAL  2017  . CHOLECYSTECTOMY  1995  . CORONARY ARTERY BYPASS GRAFT  2001   CABG X1 "at time of mitral valve repair" (12/04/2012  . DIAGNOSTIC LAPAROSCOPIC LIVER BIOPSY Left 12/03/2012   Procedure: DIAGNOSTIC LAPAROSCOPIC LIVER BIOPSY;  Surgeon: Stark Klein, MD;  Location: [place];  Service: General;  Laterality: Left;  . LAPAROSCOPIC RIGHT HEMI COLECTOMY  12/03/2012   Procedure: LAPAROSCOPIC RIGHT HEMI COLECTOMY;  Surgeon: Stark Klein, MD;  Location: [place];  Service: General;;  . MAZE  2001   w/ MVR & CABG  . MITRAL VALVE REPAIR  2001   "anterior and posterior leaflets" (12/04/2012)  . 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 TONSILLECTOMY AND ADENOIDECTOMY  ~ 1950  . UMBILICAL HERNIA REPAIR N/A 12/03/2012   Procedure: HERNIA REPAIR UMBILICAL  ADULT;  Surgeon: Stark Klein, MD;  Location: [facility];  Service: General;  Laterality: N/A;    Social History   Socioeconomic History  . Marital status: Married    Spouse name: Not on file  . Number of children: 0  . Years of education: Not on file  . Highest education level: Not on file  Occupational History  . 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 Financial resource strain: Not on file  . Food insecurity:    Worry: Not on file    Inability: Not on file  . Transportation needs:    Medical: Not on file    Non-medical: Not on file  Tobacco Use  . Smoking status: Never  Smoker  . Smokeless tobacco: Never Used  Substance and Sexual Activity  . Alcohol use: No  . Drug use: No  . Sexual activity: Never    Birth control/protection: Surgical  Lifestyle  . Physical activity:    Days per week: Not on file    Minutes per session: Not on file  . Stress: Not on file  Relationships  . Social connections:    Talks on phone: Not on file    Gets together: Not on file    Attends religious service: Not on file    Active member of club or organization: Not on file    Attends meetings of clubs or organizations: Not on file    Relationship status: Not on file  . Intimate partner violence:    Fear of current or ex partner: Not on file    Emotionally abused: Not on file    Physically abused: Not on file    Forced sexual activity: Not on file  Other Topics Concern  . Not on file  Social History Narrative       Current Outpatient Medications on File Prior to Visit  Medication Sig Dispense Refill  . ACCU-CHEK SOFTCLIX LANCETS lancets USE TO CHECK BLOOD SUGARS  TWICE A DAY 200 each 2  . acetaminophen (TYLENOL) 325 MG tablet Take 650 mg by mouth every 6 (six) hours as needed.    Marland Kitchen amoxicillin (AMOXIL) 500 MG capsule Take 4 tabs prior to dental appointments 30 capsule 0  . atorvastatin (LIPITOR) 40 MG tablet TAKE 1 TABLET BY MOUTH  DAILY 90 tablet 3  . Blood Glucose Monitoring Suppl (ACCU-CHEK AVIVA PLUS) w/Device KIT Use to check blood sugars daily Dx E11.9 1 kit 0  . calcium carbonate (OS-CAL) 600 MG TABS Take 600 mg by mouth daily.     . carvedilol (COREG) 6.25 MG tablet TAKE 1 TABLET BY MOUTH  TWICE A DAY WITH MEALS 180 tablet 3  . cholecalciferol (VITAMIN D) 1000 UNITS tablet Take 6,000 Units by mouth daily.     . fish oil-omega-3 fatty acids 1000 MG capsule Take 1 g by mouth 2 (two) times daily.     Marland Kitchen gabapentin (NEURONTIN) 300 MG capsule gabapentin 300 mg capsule  Take 1 capsule every day by oral route at bedtime for 1 day.  After day 1, take one tab at  breakfast and one tab hs    . glucose blood (ACCU-CHEK AVIVA PLUS) test strip USE 2 TIMES DAILY AS  DIRECTED 200 each 2  . glucose blood test strip Accu-Chek Aviva Plus test strips    . JARDIANCE 25 MG TABS tablet TAKE 1 TABLET BY MOUTH  DAILY. 90 tablet 1  . loperamide (IMODIUM) 2 MG capsule Take 1 capsule (2 mg total) by mouth daily as needed for diarrhea or loose stools. 30 capsule 10  . loratadine (CLARITIN) 10 MG tablet Take 10 mg by mouth daily.    . potassium chloride SA (K-DUR,KLOR-CON) 20 MEQ tablet TAKE 3 TABLETS BY MOUTH  DAILY 270 tablet 3  . spironolactone (ALDACTONE) 25 MG tablet Take 0.5 tablets (12.5 mg total) by mouth daily. 15 tablet 3  . trandolapril (MAVIK) 4 MG tablet TAKE 1 TABLET BY MOUTH TWO  TIMES DAILY FOR BLOOD  PRESSURE 180 tablet 1  . warfarin (COUMADIN) 2.5 MG tablet TAKE AS DIRECTED BY  ANTICOAGULATION CLINIC 100 tablet 1   No current facility-administered medications on file prior to visit.     Allergies  Allergen Reactions  . Demerol [Meperidine] Nausea And Vomiting  . Morphine Other (See Comments)    vomiting  . Januvia [Sitagliptin]     Headaches, did not feel well  . Metformin And Related Other (See Comments)    diarrhea  . Tetanus Toxoid Rash    "years ago"    Family History  Problem Relation Age of Onset  . Heart disease Father   . Diabetes Father   . Heart disease Sister        x 2  . Diabetes Mother   . Kidney disease Mother   . Thyroid disease Mother   . Pancreatic cancer Maternal Grandfather   . Diabetes Paternal Grandfather   . Diabetes Paternal Grandmother   . Heart disease Paternal Uncle        x 6  . Prostate cancer Paternal Uncle   . Colon cancer Neg Hx   . Colon polyps Neg Hx   . Adrenal disorder Neg Hx     BP (!) 110/58 (BP Location: Right Arm, Patient Position: Sitting, Cuff Size: Normal)   Pulse (!) 51   Ht 5' 3"  (1.6 m)   Wt 140 lb 12.8 oz (63.9 kg)   SpO2 98%   BMI 24.94 kg/m    Review of Systems She has  heat intolerance.  Denies depression and insomnia.  Menopausal sxs are better recently    Objective:   Physical Exam VITAL SIGNS:  See vs page GENERAL: no distress NECK: There is no palpable thyroid enlargement.  No thyroid nodule is palpable.  No palpable lymphadenopathy at the anterior neck. LUNGS:  Clear to auscultation HEART:  Regular rate and rhythm without murmurs noted. Normal S1,S2.   Skin: not diaphoretic.   Lab Results  Component Value Date   HGBA1C 7.4 (H) 01/11/2018    Lab Results  Component Value Date   TSH 0.62 01/11/2018       Assessment & Plan:  Excessive diaphoresis, persistent Depression, better.   Menopausal sxs: She can have a trial off effexor.   Patient Instructions  Let's recheck the 24-HR urine collection.  A1c was checked here today in error.  Please see Dr Quay Burow to follow this up.   Please try going off the effexor, to see if that helps.  Please let me know if that does not help.  If that is the case, we can try another medication for the symptoms.

## 2018-04-25 NOTE — Progress Notes (Signed)
No ICM remote transmission received for 04/15/2018 and next ICM transmission scheduled for 05/13/2018.

## 2018-04-29 LAB — CATECHOLAMINES, FRACTIONATED, URINE, 24 HOUR
Calculated Total (E+NE): 103 mcg/24 h (ref 26–121)
Creatinine, Urine mg/day-CATEUR: 0.74 g/(24.h) (ref 0.50–2.15)
DOPAMINE, 24 HR URINE: 381 ug/(24.h) (ref 52–480)
NOREPINEPHRINE, 24 HR UR: 103 ug/(24.h) — AB (ref 15–100)
Volume, Urine-VMAUR: 2500 mL

## 2018-04-29 LAB — METANEPHRINES, URINE, 24 HOUR
METANEPH TOTAL UR: 486 ug/(24.h) (ref 224–832)
METANEPHRINES UR: 58 ug/(24.h) — AB (ref 90–315)
NORMETANEPHRINE 24H UR: 428 ug/(24.h) (ref 122–676)
Volume, Urine-VMAUR: 2500

## 2018-05-10 ENCOUNTER — Other Ambulatory Visit: Payer: Self-pay | Admitting: Internal Medicine

## 2018-05-13 ENCOUNTER — Encounter: Payer: Medicare Other | Admitting: *Deleted

## 2018-05-13 ENCOUNTER — Telehealth: Payer: Self-pay

## 2018-05-13 DIAGNOSIS — M48061 Spinal stenosis, lumbar region without neurogenic claudication: Secondary | ICD-10-CM | POA: Diagnosis not present

## 2018-05-13 MED FILL — GABAPENTIN 300 MG CAPSULE: 300 | 20 days supply | Qty: 60 | Fill #0

## 2018-05-13 NOTE — Telephone Encounter (Signed)
Spoke with pt and reminded pt of remote transmission that is due today. Pt verbalized understanding.   

## 2018-05-14 ENCOUNTER — Encounter: Payer: Self-pay | Admitting: Cardiology

## 2018-05-14 DIAGNOSIS — I739 Peripheral vascular disease, unspecified: Secondary | ICD-10-CM | POA: Diagnosis not present

## 2018-05-14 DIAGNOSIS — E1151 Type 2 diabetes mellitus with diabetic peripheral angiopathy without gangrene: Secondary | ICD-10-CM | POA: Diagnosis not present

## 2018-05-14 DIAGNOSIS — L603 Nail dystrophy: Secondary | ICD-10-CM | POA: Diagnosis not present

## 2018-05-21 ENCOUNTER — Ambulatory Visit: Payer: Medicare Other | Admitting: General Practice

## 2018-05-21 DIAGNOSIS — Z7901 Long term (current) use of anticoagulants: Secondary | ICD-10-CM | POA: Diagnosis not present

## 2018-05-21 DIAGNOSIS — I4891 Unspecified atrial fibrillation: Secondary | ICD-10-CM

## 2018-05-21 LAB — POCT INR: INR: 2.2 (ref 2.0–3.0)

## 2018-05-21 NOTE — Patient Instructions (Signed)
Pre visit review using our clinic review tool, if applicable. No additional management support is needed unless otherwise documented below in the visit note.  Continue to take 1 tablet daily.  Re-check in 6 weeks.

## 2018-05-21 NOTE — Progress Notes (Signed)
No ICM remote transmission received for 05/13/2018 and next ICM transmission scheduled for 05/30/2018.

## 2018-05-22 DIAGNOSIS — M4316 Spondylolisthesis, lumbar region: Secondary | ICD-10-CM | POA: Diagnosis not present

## 2018-05-22 DIAGNOSIS — M47816 Spondylosis without myelopathy or radiculopathy, lumbar region: Secondary | ICD-10-CM | POA: Diagnosis not present

## 2018-05-22 DIAGNOSIS — M5126 Other intervertebral disc displacement, lumbar region: Secondary | ICD-10-CM | POA: Diagnosis not present

## 2018-05-22 DIAGNOSIS — I10 Essential (primary) hypertension: Secondary | ICD-10-CM | POA: Diagnosis not present

## 2018-05-22 NOTE — Progress Notes (Signed)
Agree with management.  Kainoa Swoboda J Rocco Kerkhoff, MD  

## 2018-05-28 MED FILL — GABAPENTIN 300 MG CAPSULE: 300 | 30 days supply | Qty: 90 | Fill #0

## 2018-05-30 ENCOUNTER — Telehealth: Payer: Self-pay

## 2018-05-30 NOTE — Telephone Encounter (Signed)
Spoke with pt and reminded pt of remote transmission that is due today. Pt verbalized understanding.   

## 2018-05-31 ENCOUNTER — Telehealth (HOSPITAL_COMMUNITY): Payer: Self-pay

## 2018-05-31 ENCOUNTER — Other Ambulatory Visit: Payer: Self-pay | Admitting: Neurological Surgery

## 2018-05-31 NOTE — Telephone Encounter (Signed)
Medical clearance from Dr. Aundra Dubin for Anita Duke neuro-surgry and spine was completed for left L3-4 microdiscetomy. Pt may hold warfarin 5 days before surgery. Clearance faxed 05/31/18 to [phone]

## 2018-06-04 ENCOUNTER — Telehealth: Payer: Self-pay | Admitting: General Practice

## 2018-06-04 NOTE — Telephone Encounter (Signed)
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    SB ----- Message ----- From: Warden Fillers, RN Sent: 06/03/2018   4:17 PM EDT To: Binnie Rail, MD Subject: Lovenox bridge                                 Hi Dr. Quay Burow,  Patient is having back surgery on 10/14 and has been instructed to hold coumadin 5 days prior.  She has a-fib and has a pacemaker.  Do you see a need for a Lovenox bridge?  Please advise.  Thanks, Villa Herb, RN

## 2018-06-05 ENCOUNTER — Telehealth: Payer: Self-pay | Admitting: General Practice

## 2018-06-05 ENCOUNTER — Other Ambulatory Visit: Payer: Self-pay | Admitting: General Practice

## 2018-06-05 MED ORDER — ENOXAPARIN SODIUM 100 MG/ML ~~LOC~~ SOLN: 100.0000 mg | SUBCUTANEOUS | 0 refills | Status: DC

## 2018-06-05 MED FILL — ENOXAPARIN 100 MG/ML SYR: 100 | 7 days supply | Qty: 7 | Fill #0

## 2018-06-05 NOTE — Telephone Encounter (Signed)
Instructions for coumadin and Lovenox bridge for procedure on 10/14.   10/9 - Last dose of coumadin until after procedure 10/10 -Nothing (No coumadin or Lovenox) 10/11 - Lovenox in the AM 10/12 - Lovenox in the AM 10/13 - Lovenox in the AM 10/14 - Procedure (Do not take Lovenox today) 10/15 - Lovenox and 1 1/2 tablets of coumadin 10/16 - Lovenox and 1 1/2 tablets of coumadin 10/17 - Lovenox and 1 1/2 tablets of coumadin 10/18 - Check INR  Patient verbalizes understanding.

## 2018-06-05 NOTE — Telephone Encounter (Signed)
Opened in error

## 2018-06-07 ENCOUNTER — Ambulatory Visit: Payer: Medicare Other | Admitting: General Practice

## 2018-06-07 DIAGNOSIS — Z7901 Long term (current) use of anticoagulants: Secondary | ICD-10-CM

## 2018-06-07 DIAGNOSIS — I4891 Unspecified atrial fibrillation: Secondary | ICD-10-CM

## 2018-06-07 NOTE — Progress Notes (Signed)
Agree with management.  Patrecia Veiga J Breven Guidroz, MD  

## 2018-06-07 NOTE — Patient Instructions (Addendum)
Pre visit review using our clinic review tool, if applicable. No additional management support is needed unless otherwise documented below in the visit note.  Patient is here today for Lovenox bridge instructions.  Continue to take 1 tablet daily.  Re-check in 2 weeks.  Please follow patient instructions.   Instructions for coumadin and Lovenox bridge for procedure on 10/14.  10/9 - Last dose of coumadin until after procedure 10/10 -Nothing (No coumadin or Lovenox) 10/11 - Lovenox in the AM 10/12 - Lovenox in the AM 10/13 - Lovenox in the AM 10/14 - Procedure (Do not take Lovenox today) 10/15 - Lovenox and 1 1/2 tablets of coumadin 10/16 - Lovenox and 1 1/2 tablets of coumadin 10/17 - Lovenox and 1 1/2 tablets of coumadin 10/18 - Check INR  Patient verbalizes understanding.

## 2018-06-11 ENCOUNTER — Other Ambulatory Visit: Payer: Self-pay | Admitting: Internal Medicine

## 2018-06-11 NOTE — Pre-Procedure Instructions (Signed)
Anita Duke  06/11/2018      [facility] [place], [place] [facility] [address] [place] [address] Phone: [phone] Fax: [phone]  [id number], [place] [address] [place] [address] Phone: [phone] Fax: [place], [place] [address] [place] [address] Phone: [phone] Fax: [phone]    Your procedure is scheduled on Monday October 14th.  Report to [facility] Admitting at 0900 A.M.  Call this number if you have problems the morning of surgery:  [phone]   Remember:  Do not eat or drink after midnight.      Take these medicines the morning of surgery with A SIP OF WATER   Tylenol (if needed)  Atorvastatin  Coreg  Gabapentin  Imodium (if needed)   You last dose of Warfarin should be on 06/12/18.  7 days prior to surgery STOP taking any Aspirin(unless otherwise instructed by your surgeon), Aleve, Naproxen, Ibuprofen, Motrin, Advil, Goody's, BC's, all herbal medications, fish oil, and all vitamins   WHAT DO I DO ABOUT MY DIABETES MEDICATION?   Marland Kitchen Do not take oral diabetes medicines (pills) the morning of surgery: Jardiance.   How to Manage Your Diabetes Before and After Surgery  Why is it important to control my blood sugar before and after surgery? . Improving blood sugar levels before and after surgery helps healing and can limit problems. . A way of improving blood sugar control is eating a healthy diet by: o  Eating less sugar and carbohydrates o  Increasing activity/exercise o  Talking with your doctor about reaching your blood sugar goals . High blood sugars (greater than 180 mg/dL) can raise your risk of infections and slow your recovery, so you will need to focus on controlling your diabetes during the weeks before  surgery. . Make sure that the doctor who takes care of your diabetes knows about your planned surgery including the date and location.  How do I manage my blood sugar before surgery? . Check your blood sugar at least 4 times a day, starting 2 days before surgery, to make sure that the level is not too high or low. o Check your blood sugar the morning of your surgery when you wake up and every 2 hours until you get to the Short Stay unit. . If your blood sugar is less than 70 mg/dL, you will need to treat for low blood sugar: o Do not take insulin. o Treat a low blood sugar (less than 70 mg/dL) with  cup of clear juice (cranberry or apple), 4 glucose tablets, OR glucose gel. o Recheck blood sugar in 15 minutes after treatment (to make sure it is greater than 70 mg/dL). If your blood sugar is not greater than 70 mg/dL on recheck, call [phone] for further instructions. . Report your blood sugar to the short stay nurse when you get to Short Stay.  . If you are admitted to the hospital after surgery: o Your blood sugar will be checked by the staff and you will probably be given insulin after surgery (instead of oral diabetes medicines) to make sure you have good blood sugar levels. o The goal for blood sugar control after surgery is 80-180 mg/dL.     Do not wear jewelry, make-up or nail polish.  Do not wear lotions, powders, or perfumes,  or deodorant.  Do not shave 48 hours prior to surgery.  Men may shave face and neck.  Do not bring valuables to the hospital.  [facility] is not responsible for any belongings or valuables.  Contacts, dentures or bridgework may not be worn into surgery.  Leave your suitcase in the car.  After surgery it may be brought to your room.  For patients admitted to the hospital, discharge time will be determined by your treatment team.  Patients discharged the day of surgery will not be allowed to drive home.    [place]- Preparing For Surgery  Before  surgery, you can play an important role. Because skin is not sterile, your skin needs to be as free of germs as possible. You can reduce the number of germs on your skin by washing with CHG (chlorahexidine gluconate) Soap before surgery.  CHG is an antiseptic cleaner which kills germs and bonds with the skin to continue killing germs even after washing.    Oral Hygiene is also important to reduce your risk of infection.  Remember - BRUSH YOUR TEETH THE MORNING OF SURGERY WITH YOUR REGULAR TOOTHPASTE  Please do not use if you have an allergy to CHG or antibacterial soaps. If your skin becomes reddened/irritated stop using the CHG.  Do not shave (including legs and underarms) for at least 48 hours prior to first CHG shower. It is OK to shave your face.  Please follow these instructions carefully.   1. Shower the NIGHT BEFORE SURGERY and the MORNING OF SURGERY with CHG.   2. If you chose to wash your hair, wash your hair first as usual with your normal shampoo.  3. After you shampoo, rinse your hair and body thoroughly to remove the shampoo.  4. Use CHG as you would any other liquid soap. You can apply CHG directly to the skin and wash gently with a scrungie or a clean washcloth.   5. Apply the CHG Soap to your body ONLY FROM THE NECK DOWN.  Do not use on open wounds or open sores. Avoid contact with your eyes, ears, mouth and genitals (private parts). Wash Face and genitals (private parts)  with your normal soap.  6. Wash thoroughly, paying special attention to the area where your surgery will be performed.  7. Thoroughly rinse your body with warm water from the neck down.  8. DO NOT shower/wash with your normal soap after using and rinsing off the CHG Soap.  9. Pat yourself dry with a CLEAN TOWEL.  10. Wear CLEAN PAJAMAS to bed the night before surgery, wear comfortable clothes the morning of surgery  11. Place CLEAN SHEETS on your bed the night of your first shower and DO NOT SLEEP WITH  PETS.    Day of Surgery:  Do not apply any deodorants/lotions.  Please wear clean clothes to the hospital/surgery center.   Remember to brush your teeth WITH YOUR REGULAR TOOTHPASTE.    Please read over the following fact sheets that you were given.

## 2018-06-12 ENCOUNTER — Encounter (HOSPITAL_COMMUNITY): Payer: Self-pay

## 2018-06-12 ENCOUNTER — Encounter (HOSPITAL_COMMUNITY)
Admission: RE | Admit: 2018-06-12 | Discharge: 2018-06-12 | Disposition: A | Discharge status: Home / Self Care | Payer: Medicare Other | Source: Ambulatory Visit | Attending: Neurological Surgery | Admitting: Neurological Surgery

## 2018-06-12 ENCOUNTER — Other Ambulatory Visit: Payer: Self-pay

## 2018-06-12 DIAGNOSIS — Z01818 Encounter for other preprocedural examination: Secondary | ICD-10-CM | POA: Diagnosis not present

## 2018-06-12 DIAGNOSIS — R9431 Abnormal electrocardiogram [ECG] [EKG]: Secondary | ICD-10-CM | POA: Diagnosis not present

## 2018-06-12 HISTORY — DX: Presence of cardiac pacemaker: Z95.0

## 2018-06-12 HISTORY — DX: Adverse effect of unspecified anesthetic, initial encounter: T41.45XA

## 2018-06-12 LAB — CBC
HEMATOCRIT: 45.5 % (ref 36.0–46.0)
Hemoglobin: 14.1 g/dL (ref 12.0–15.0)
MCH: 30 pg (ref 26.0–34.0)
MCHC: 31 g/dL (ref 30.0–36.0)
MCV: 96.8 fL (ref 80.0–100.0)
PLATELETS: 174 10*3/uL (ref 150–400)
RBC: 4.7 MIL/uL (ref 3.87–5.11)
RDW: 13.2 % (ref 11.5–15.5)
WBC: 5.2 10*3/uL (ref 4.0–10.5)
nRBC: 0 % (ref 0.0–0.2)

## 2018-06-12 LAB — HEMOGLOBIN A1C
Hgb A1c MFr Bld: 6.9 % — ABNORMAL HIGH (ref 4.8–5.6)
Mean Plasma Glucose: 151.33 mg/dL

## 2018-06-12 LAB — BASIC METABOLIC PANEL
Anion gap: 9 (ref 5–15)
BUN: 17 mg/dL (ref 8–23)
CO2: 25 mmol/L (ref 22–32)
Calcium: 9.5 mg/dL (ref 8.9–10.3)
Chloride: 107 mmol/L (ref 98–111)
Creatinine, Ser: 0.52 mg/dL (ref 0.44–1.00)
GFR calc Af Amer: >60 mL/min (ref >60)
GLUCOSE: 108 mg/dL — AB (ref 70–99)
POTASSIUM: 4.2 mmol/L (ref 3.5–5.1)
Sodium: 141 mmol/L (ref 135–145)

## 2018-06-12 LAB — SURGICAL PCR SCREEN
MRSA, PCR: NEGATIVE
STAPHYLOCOCCUS AUREUS: NEGATIVE

## 2018-06-12 LAB — GLUCOSE, CAPILLARY: GLUCOSE-CAPILLARY: 99 mg/dL (ref 70–99)

## 2018-06-12 NOTE — Progress Notes (Signed)
Windle Guard with [facility] called and made aware of pt's surgery. He stated someone will be here at 1000 the day of pt's surgery.

## 2018-06-12 NOTE — Progress Notes (Signed)
PCP -  Billey Gosling MD Cardiologist -  Dr. Aundra Dubin  EKG - 06/12/18 Stress Test - 2014 ECHO - 2018  Fasting Blood Sugar - doesn't know Checks Blood Sugar - doesn't check her blood sugar  Blood Thinner Instructions: Last dose 06/12/18  Anesthesia review: yes, PPM and cardiac history review  Patient denies shortness of breath, fever, cough and chest pain at PAT appointment   Patient verbalized understanding of instructions that were given to them at the PAT appointment. Patient was also instructed that they will need to review over the PAT instructions again at home before surgery.

## 2018-06-13 NOTE — Progress Notes (Signed)
Anesthesia Chart Review:  Case:  [id number] Date/Time:  06/17/18 1045   Procedure:  left Lumbar 3-4 extraforaminal Microdiscectomy with Met-Rx (Left Back) - left Lumbar 3-4 Microdiscectomy with Met-Rx   Anesthesia type:  General   Pre-op diagnosis:  Lumbar herniated disc   Location:  [facility] OR ROOM 59 / [place] [facility]   Surgeon:  Kristeen Miss, MD      DISCUSSION: 77 year old female with a past medical history of pulmonary HTN, mitral regurgitation s/p mitral valve repair, MAZE procedure at the [facility] in 2001, at that time she also had a LIMA to LAD graft placed. Also with history of permanent Afib on warfarin s/p AVN ablation. She has chronic systolic CHF (EF 09-32% in 06/2017, mild-mod RV dysfx).  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 Last cath in 2014 with atretic LIMA to LAD but nonobstructive disease in LAD.   Per telephone encounter 05/31/2018 by Jake Seats the pt has preop clearance from Dr. Aundra Dubin. Advised to hold warfarin 5d preop.  Per patient instructions 06/07/2018 by Meriam Sprague the pt LD of warfarin 10/9 and will start lovenox bridge 10/11.  PPM device form on chart.  Anticipate she can proceed as planned barring acute status change.  VS: BP (!) 158/70   Pulse 97   Temp (!) 36.4 C (Oral)   Resp 18   Ht 5' 2" (1.575 m)   Wt 68.4 kg   SpO2 100%   BMI 27.58 kg/m   PROVIDERS: Binnie Rail, MD is PCP  Loralie Champagne, MD is Cardiologist  Thompson Grayer, MD is EP Cardiologist  LABS: Labs reviewed: Acceptable for surgery. (all labs ordered are listed, but only abnormal results are displayed)  Labs Reviewed  BASIC METABOLIC PANEL - Abnormal; Notable for the following components:      Result Value   Glucose, Bld 108 (*)    All other components within normal limits  HEMOGLOBIN A1C - Abnormal; Notable for the following components:   Hgb A1c MFr Bld 6.9 (*)    All other components within normal limits  SURGICAL PCR SCREEN  GLUCOSE, CAPILLARY  CBC      IMAGES: N/A   EKG: 06/12/2018: V paced rhythm rate 73  CV: TTE 06/19/2017: Study Conclusions  - Left ventricle: The cavity size was normal. Wall thickness was   increased in a pattern of moderate LVH. Systolic function was   normal. The estimated ejection fraction was in the range of 55%   to 60%. Wall motion was normal; there were no regional wall   motion abnormalities. - Mitral valve: Mildly to moderately calcified annulus. - Left atrium: The atrium was severely dilated. - Right ventricle: Systolic function was mildly to moderately   reduced. - Right atrium: The atrium was moderately dilated. - Tricuspid valve: There was mild-moderate regurgitation. - Pulmonary arteries: Systolic pressure was mildly increased. PA   peak pressure: 35 mm Hg (S).   Past Medical History:  Diagnosis Date  . Anemia    Hemoglobin 10.4, December, 2013  . Arthritis    "not bad; little in my hands; some in my knees" (11/04/2013)  . Asthma   . CAD (coronary artery disease)    LIMA to the LAD at time of mitral valve repair / LIMA atretic,, February, 2011  . Cataracts, bilateral   . 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 CHF (congestive heart failure) ([place])   . Colon polyp, hyperplastic   . Complication of anesthesia   .  Depression   . Diverticulosis of colon   . GERD (gastroesophageal reflux disease)   . Hemorrhoids, internal   . High cholesterol   . HTN (hypertension)   . Mitral regurgitation    a. s/p repair with LAA ligation and MAZE at time of surgery  . Permanent atrial fibrillation    a. s/p MAZE b. s/p AVN ablation  . PONV (postoperative nausea and vomiting)   . Presence of permanent cardiac pacemaker   . Pulmonary HTN ([place])   . QT prolongation    Tikosyn and Effexor. QT prolonged October 13, 2011, peak is in dose reduced from 500  to -250 twice a day  . Type II diabetes mellitus ([place])     Past Surgical History:  Procedure Laterality Date  . ABDOMINAL HYSTERECTOMY  ?2002   . AV NODE ABLATION N/A 05/28/2012   Procedure: AV NODE ABLATION;  Surgeon: Thompson Grayer, MD;  Location: [facility] CATH LAB;  Service: Cardiovascular;  Laterality: N/A;  . BI-VENTRICULAR PACEMAKER UPGRADE N/A 11/04/2013   upgrade of previously implanted dual chamber pacemaker to STJ Quadra Allure CRTP by Dr Rayann Heman  . BREAST EXCISIONAL BIOPSY Left over 10 years ago   benign  . BREAST SURGERY    . CARDIOVERSION  02/08/2012   Procedure: CARDIOVERSION;  Surgeon: Carlena Bjornstad, MD;  Location: [place];  Service: Cardiovascular;  Laterality: N/A;  . CARDIOVERSION  02/29/2012   Procedure: CARDIOVERSION;  Surgeon: Carlena Bjornstad, MD;  Location: [place];  Service: Cardiovascular;  Laterality: N/A;  . CARDIOVERSION  05/15/2012   Procedure: CARDIOVERSION;  Surgeon: Carlena Bjornstad, MD;  Location: [place];  Service: Cardiovascular;  Laterality: N/A;  . CATARACT EXTRACTION, BILATERAL  2017  . CHOLECYSTECTOMY  1995  . CORONARY ARTERY BYPASS GRAFT  2001   CABG X1 "at time of mitral valve repair" (12/04/2012  . DIAGNOSTIC LAPAROSCOPIC LIVER BIOPSY Left 12/03/2012   Procedure: DIAGNOSTIC LAPAROSCOPIC LIVER BIOPSY;  Surgeon: Stark Klein, MD;  Location: [place];  Service: General;  Laterality: Left;  . EYE SURGERY     bilateral cataract surgery  . LAPAROSCOPIC RIGHT HEMI COLECTOMY  12/03/2012   Procedure: LAPAROSCOPIC RIGHT HEMI COLECTOMY;  Surgeon: Stark Klein, MD;  Location: [place];  Service: General;;  . MAZE  2001   w/ MVR & CABG  . MITRAL VALVE REPAIR  2001   "anterior and posterior leaflets" (12/04/2012)  . 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 TONSILLECTOMY AND ADENOIDECTOMY  ~ 1950  . UMBILICAL HERNIA REPAIR N/A 12/03/2012   Procedure: HERNIA REPAIR UMBILICAL ADULT;  Surgeon: Stark Klein, MD;  Location: [place];  Service: General;  Laterality: N/A;    MEDICATIONS: . ACCU-CHEK SOFTCLIX LANCETS lancets  . acetaminophen (TYLENOL) 325 MG tablet  .  amoxicillin (AMOXIL) 500 MG capsule  . atorvastatin (LIPITOR) 40 MG tablet  . Blood Glucose Monitoring Suppl (ACCU-CHEK AVIVA PLUS) w/Device KIT  . carvedilol (COREG) 6.25 MG tablet  . cholecalciferol (VITAMIN D) 1000 UNITS tablet  . enoxaparin (LOVENOX) 100 MG/ML injection  . fish oil-omega-3 fatty acids 1000 MG capsule  . gabapentin (NEURONTIN) 300 MG capsule  . glucose blood (ACCU-CHEK AVIVA PLUS) test strip  . glucose blood test strip  . JARDIANCE 25 MG TABS tablet  . loperamide (IMODIUM) 2 MG capsule  . loratadine (CLARITIN) 10 MG tablet  . potassium chloride SA (K-DUR,KLOR-CON) 20 MEQ tablet  . spironolactone (ALDACTONE) 25 MG tablet  .  trandolapril (MAVIK) 4 MG tablet  . warfarin (COUMADIN) 2.5 MG tablet   No current facility-administered medications for this encounter.     Wynonia Musty [facility] Short Stay Center/Anesthesiology Phone [phone] 06/13/2018 11:07 AM

## 2018-06-17 ENCOUNTER — Other Ambulatory Visit: Payer: Self-pay

## 2018-06-17 ENCOUNTER — Encounter (HOSPITAL_COMMUNITY)
Admission: RE | Disposition: A | Discharge status: Skilled Nursing Facility | Payer: Self-pay | Source: Ambulatory Visit | Attending: Neurological Surgery

## 2018-06-17 ENCOUNTER — Ambulatory Visit (HOSPITAL_COMMUNITY): Payer: Medicare Other | Admitting: Certified Registered"

## 2018-06-17 ENCOUNTER — Observation Stay (HOSPITAL_COMMUNITY)
Admission: RE | Admit: 2018-06-17 | Discharge: 2018-06-18 | Disposition: A | Discharge status: Skilled Nursing Facility | Payer: Medicare Other | Source: Ambulatory Visit | Attending: Neurological Surgery | Admitting: Neurological Surgery

## 2018-06-17 ENCOUNTER — Encounter (HOSPITAL_COMMUNITY): Payer: Self-pay | Admitting: *Deleted

## 2018-06-17 ENCOUNTER — Ambulatory Visit (HOSPITAL_COMMUNITY): Payer: Medicare Other | Admitting: Physician Assistant

## 2018-06-17 ENCOUNTER — Ambulatory Visit (HOSPITAL_COMMUNITY): Payer: Medicare Other

## 2018-06-17 DIAGNOSIS — Z950 Presence of cardiac pacemaker: Secondary | ICD-10-CM | POA: Insufficient documentation

## 2018-06-17 DIAGNOSIS — Z7901 Long term (current) use of anticoagulants: Secondary | ICD-10-CM | POA: Insufficient documentation

## 2018-06-17 DIAGNOSIS — M5116 Intervertebral disc disorders with radiculopathy, lumbar region: Principal | ICD-10-CM | POA: Insufficient documentation

## 2018-06-17 DIAGNOSIS — I341 Nonrheumatic mitral (valve) prolapse: Secondary | ICD-10-CM | POA: Diagnosis not present

## 2018-06-17 DIAGNOSIS — M4726 Other spondylosis with radiculopathy, lumbar region: Secondary | ICD-10-CM | POA: Insufficient documentation

## 2018-06-17 DIAGNOSIS — I10 Essential (primary) hypertension: Secondary | ICD-10-CM | POA: Diagnosis not present

## 2018-06-17 DIAGNOSIS — M5416 Radiculopathy, lumbar region: Secondary | ICD-10-CM

## 2018-06-17 DIAGNOSIS — E119 Type 2 diabetes mellitus without complications: Secondary | ICD-10-CM | POA: Diagnosis not present

## 2018-06-17 DIAGNOSIS — Z79899 Other long term (current) drug therapy: Secondary | ICD-10-CM | POA: Insufficient documentation

## 2018-06-17 DIAGNOSIS — I2510 Atherosclerotic heart disease of native coronary artery without angina pectoris: Secondary | ICD-10-CM | POA: Diagnosis not present

## 2018-06-17 DIAGNOSIS — Z981 Arthrodesis status: Secondary | ICD-10-CM | POA: Diagnosis not present

## 2018-06-17 DIAGNOSIS — Z419 Encounter for procedure for purposes other than remedying health state, unspecified: Secondary | ICD-10-CM

## 2018-06-17 HISTORY — PX: LUMBAR LAMINECTOMY/ DECOMPRESSION WITH MET-RX: SHX5959

## 2018-06-17 HISTORY — DX: Radiculopathy, lumbar region: M54.16

## 2018-06-17 LAB — GLUCOSE, CAPILLARY
GLUCOSE-CAPILLARY: 122 mg/dL — AB (ref 70–99)
GLUCOSE-CAPILLARY: 220 mg/dL — AB (ref 70–99)
GLUCOSE-CAPILLARY: 167 mg/dL — AB (ref 70–99)
Glucose-Capillary: 115 mg/dL — ABNORMAL HIGH (ref 70–99)

## 2018-06-17 LAB — PROTIME-INR
INR: 1.09
Prothrombin Time: 14 seconds (ref 11.4–15.2)

## 2018-06-17 SURGERY — LUMBAR LAMINECTOMY/ DECOMPRESSION WITH MET-RX
Anesthesia: General | Site: Back | Laterality: Left

## 2018-06-17 MED ORDER — FENTANYL CITRATE (PF) 100 MCG/2ML IJ SOLN: 25.0000 ug | INTRAMUSCULAR | Status: DC | PRN

## 2018-06-17 MED ORDER — TRANDOLAPRIL 4 MG PO TABS
4.0000 mg | ORAL_TABLET | Freq: Two times a day (BID) | ORAL | Status: DC
  Administered 2018-06-17 – 2018-06-18 (×2): 4 mg via ORAL
  Filled 2018-06-17 (×3): qty 1

## 2018-06-17 MED ORDER — BUPIVACAINE HCL (PF) 0.5 % IJ SOLN
INTRAMUSCULAR | Status: DC | PRN
  Administered 2018-06-17 (×2): 10 mL

## 2018-06-17 MED ORDER — GABAPENTIN 300 MG PO CAPS
300.0000 mg | ORAL_CAPSULE | Freq: Every day | ORAL | Status: DC
  Administered 2018-06-18: 300 mg via ORAL
  Filled 2018-06-17: qty 1

## 2018-06-17 MED ORDER — PHENOL 1.4 % MT LIQD: 1.0000 | OROMUCOSAL | Status: DC | PRN

## 2018-06-17 MED ORDER — THROMBIN 5000 UNITS EX SOLR
OROMUCOSAL | Status: DC | PRN
  Administered 2018-06-17: 11:00:00 via TOPICAL

## 2018-06-17 MED ORDER — SENNA 8.6 MG PO TABS
1.0000 | ORAL_TABLET | Freq: Two times a day (BID) | ORAL | Status: DC
  Administered 2018-06-17 – 2018-06-18 (×2): 8.6 mg via ORAL
  Filled 2018-06-17 (×2): qty 1

## 2018-06-17 MED ORDER — DEXAMETHASONE SODIUM PHOSPHATE 10 MG/ML IJ SOLN
INTRAMUSCULAR | Status: DC | PRN
  Administered 2018-06-17: 4 mg via INTRAVENOUS

## 2018-06-17 MED ORDER — 0.9 % SODIUM CHLORIDE (POUR BTL) OPTIME
TOPICAL | Status: DC | PRN
  Administered 2018-06-17 (×2): 1000 mL

## 2018-06-17 MED ORDER — LIDOCAINE-EPINEPHRINE 1 %-1:100000 IJ SOLN
INTRAMUSCULAR | Status: DC | PRN
  Administered 2018-06-17: 10 mL

## 2018-06-17 MED ORDER — INSULIN ASPART 100 UNIT/ML ~~LOC~~ SOLN
0.0000 [IU] | Freq: Three times a day (TID) | SUBCUTANEOUS | Status: DC
  Administered 2018-06-18 (×2): 2 [IU] via SUBCUTANEOUS

## 2018-06-17 MED ORDER — LOPERAMIDE HCL 2 MG PO CAPS
2.0000 mg | ORAL_CAPSULE | Freq: Every day | ORAL | Status: DC | PRN
  Filled 2018-06-17: qty 1

## 2018-06-17 MED ORDER — SPIRONOLACTONE 12.5 MG HALF TABLET
12.5000 mg | ORAL_TABLET | Freq: Two times a day (BID) | ORAL | Status: DC
  Administered 2018-06-17 – 2018-06-18 (×2): 12.5 mg via ORAL
  Filled 2018-06-17 (×3): qty 1

## 2018-06-17 MED ORDER — THROMBIN 5000 UNITS EX SOLR
CUTANEOUS | Status: AC
  Filled 2018-06-17: qty 5000

## 2018-06-17 MED ORDER — CHLORHEXIDINE GLUCONATE CLOTH 2 % EX PADS: 6.0000 | MEDICATED_PAD | Freq: Once | CUTANEOUS | Status: DC

## 2018-06-17 MED ORDER — SODIUM CHLORIDE 0.9 % IV SOLN: 250.0000 mL | INTRAVENOUS | Status: DC

## 2018-06-17 MED ORDER — ACETAMINOPHEN 650 MG RE SUPP: 650.0000 mg | RECTAL | Status: DC | PRN

## 2018-06-17 MED ORDER — CEFAZOLIN SODIUM-DEXTROSE 2-4 GM/100ML-% IV SOLN
INTRAVENOUS | Status: AC
  Filled 2018-06-17: qty 100

## 2018-06-17 MED ORDER — FENTANYL CITRATE (PF) 100 MCG/2ML IJ SOLN
INTRAMUSCULAR | Status: DC | PRN
  Administered 2018-06-17 (×2): 50 ug via INTRAVENOUS
  Administered 2018-06-17: 150 ug via INTRAVENOUS

## 2018-06-17 MED ORDER — ALBUMIN HUMAN 5 % IV SOLN
INTRAVENOUS | Status: DC | PRN
  Administered 2018-06-17: 12:00:00 via INTRAVENOUS

## 2018-06-17 MED ORDER — FLEET ENEMA 7-19 GM/118ML RE ENEM: 1.0000 | ENEMA | Freq: Once | RECTAL | Status: DC | PRN

## 2018-06-17 MED ORDER — BUPIVACAINE HCL (PF) 0.5 % IJ SOLN
INTRAMUSCULAR | Status: AC
  Filled 2018-06-17: qty 30

## 2018-06-17 MED ORDER — PROPOFOL 10 MG/ML IV BOLUS
INTRAVENOUS | Status: AC
  Filled 2018-06-17: qty 20

## 2018-06-17 MED ORDER — GABAPENTIN 300 MG PO CAPS
600.0000 mg | ORAL_CAPSULE | Freq: Every day | ORAL | Status: DC
  Administered 2018-06-17: 600 mg via ORAL
  Filled 2018-06-17: qty 2

## 2018-06-17 MED ORDER — SODIUM CHLORIDE 0.9 % IV SOLN
INTRAVENOUS | Status: DC | PRN
  Administered 2018-06-17: 50 ug/min via INTRAVENOUS

## 2018-06-17 MED ORDER — SODIUM CHLORIDE 0.9% FLUSH: 3.0000 mL | INTRAVENOUS | Status: DC | PRN

## 2018-06-17 MED ORDER — SODIUM CHLORIDE 0.9 % IV SOLN
INTRAVENOUS | Status: DC | PRN
  Administered 2018-06-17: 11:00:00

## 2018-06-17 MED ORDER — GABAPENTIN 300 MG PO CAPS: 300.0000 mg | ORAL_CAPSULE | ORAL | Status: DC

## 2018-06-17 MED ORDER — METHOCARBAMOL 1000 MG/10ML IJ SOLN
500.0000 mg | Freq: Four times a day (QID) | INTRAVENOUS | Status: DC | PRN
  Filled 2018-06-17: qty 5

## 2018-06-17 MED ORDER — PROPOFOL 10 MG/ML IV BOLUS
INTRAVENOUS | Status: DC | PRN
  Administered 2018-06-17: 180 mg via INTRAVENOUS

## 2018-06-17 MED ORDER — MORPHINE SULFATE (PF) 2 MG/ML IV SOLN: 2.0000 mg | INTRAVENOUS | Status: DC | PRN

## 2018-06-17 MED ORDER — SUGAMMADEX SODIUM 200 MG/2ML IV SOLN
INTRAVENOUS | Status: DC | PRN
  Administered 2018-06-17: 200 mg via INTRAVENOUS

## 2018-06-17 MED ORDER — BUPIVACAINE LIPOSOME 1.3 % IJ SUSP
20.0000 mL | INTRAMUSCULAR | Status: DC
  Filled 2018-06-17: qty 20

## 2018-06-17 MED ORDER — ONDANSETRON HCL 4 MG/2ML IJ SOLN
INTRAMUSCULAR | Status: AC
  Filled 2018-06-17: qty 2

## 2018-06-17 MED ORDER — CANAGLIFLOZIN 100 MG PO TABS
100.0000 mg | ORAL_TABLET | Freq: Every day | ORAL | Status: DC
  Administered 2018-06-18: 100 mg via ORAL
  Filled 2018-06-17: qty 1

## 2018-06-17 MED ORDER — INSULIN ASPART 100 UNIT/ML ~~LOC~~ SOLN: 0.0000 [IU] | Freq: Every day | SUBCUTANEOUS | Status: DC

## 2018-06-17 MED ORDER — ENOXAPARIN SODIUM 100 MG/ML ~~LOC~~ SOLN: 100.0000 mg | SUBCUTANEOUS | Status: DC

## 2018-06-17 MED ORDER — ACETAMINOPHEN 325 MG PO TABS: 650.0000 mg | ORAL_TABLET | ORAL | Status: DC | PRN

## 2018-06-17 MED ORDER — ONDANSETRON HCL 4 MG/2ML IJ SOLN
INTRAMUSCULAR | Status: DC | PRN
  Administered 2018-06-17: 4 mg via INTRAVENOUS

## 2018-06-17 MED ORDER — ROCURONIUM BROMIDE 50 MG/5ML IV SOSY
PREFILLED_SYRINGE | INTRAVENOUS | Status: AC
  Filled 2018-06-17: qty 5

## 2018-06-17 MED ORDER — ONDANSETRON HCL 4 MG PO TABS: 4.0000 mg | ORAL_TABLET | Freq: Four times a day (QID) | ORAL | Status: DC | PRN

## 2018-06-17 MED ORDER — SUCCINYLCHOLINE CHLORIDE 200 MG/10ML IV SOSY
PREFILLED_SYRINGE | INTRAVENOUS | Status: AC
  Filled 2018-06-17: qty 10

## 2018-06-17 MED ORDER — ROCURONIUM BROMIDE 50 MG/5ML IV SOSY
PREFILLED_SYRINGE | INTRAVENOUS | Status: DC | PRN
  Administered 2018-06-17: 50 mg via INTRAVENOUS

## 2018-06-17 MED ORDER — LIDOCAINE 2% (20 MG/ML) 5 ML SYRINGE
INTRAMUSCULAR | Status: DC | PRN
  Administered 2018-06-17: 60 mg via INTRAVENOUS

## 2018-06-17 MED ORDER — SODIUM CHLORIDE 0.9% FLUSH
3.0000 mL | Freq: Two times a day (BID) | INTRAVENOUS | Status: DC
  Administered 2018-06-17: 3 mL via INTRAVENOUS

## 2018-06-17 MED ORDER — CEFAZOLIN SODIUM-DEXTROSE 2-4 GM/100ML-% IV SOLN
2.0000 g | Freq: Three times a day (TID) | INTRAVENOUS | Status: AC
  Administered 2018-06-17 – 2018-06-18 (×2): 2 g via INTRAVENOUS
  Filled 2018-06-17 (×2): qty 100

## 2018-06-17 MED ORDER — THROMBIN 5000 UNITS EX SOLR
CUTANEOUS | Status: AC
  Filled 2018-06-17: qty 10000

## 2018-06-17 MED ORDER — HYDROCODONE-ACETAMINOPHEN 5-325 MG PO TABS
1.0000 | ORAL_TABLET | ORAL | Status: DC | PRN
  Administered 2018-06-17 – 2018-06-18 (×3): 2 via ORAL
  Administered 2018-06-18: 1 via ORAL
  Filled 2018-06-17: qty 1
  Filled 2018-06-17 (×3): qty 2

## 2018-06-17 MED ORDER — METHOCARBAMOL 500 MG PO TABS
500.0000 mg | ORAL_TABLET | Freq: Four times a day (QID) | ORAL | Status: DC | PRN
  Administered 2018-06-17 – 2018-06-18 (×2): 500 mg via ORAL
  Filled 2018-06-17 (×2): qty 1

## 2018-06-17 MED ORDER — CEFAZOLIN SODIUM-DEXTROSE 2-4 GM/100ML-% IV SOLN
2.0000 g | INTRAVENOUS | Status: AC
  Administered 2018-06-17: 2 g via INTRAVENOUS

## 2018-06-17 MED ORDER — DOCUSATE SODIUM 100 MG PO CAPS
100.0000 mg | ORAL_CAPSULE | Freq: Two times a day (BID) | ORAL | Status: DC
  Administered 2018-06-17 – 2018-06-18 (×2): 100 mg via ORAL
  Filled 2018-06-17 (×2): qty 1

## 2018-06-17 MED ORDER — LACTATED RINGERS IV SOLN
INTRAVENOUS | Status: DC
  Administered 2018-06-17 (×2): via INTRAVENOUS

## 2018-06-17 MED ORDER — LIDOCAINE-EPINEPHRINE 1 %-1:100000 IJ SOLN
INTRAMUSCULAR | Status: AC
  Filled 2018-06-17: qty 1

## 2018-06-17 MED ORDER — LORATADINE 10 MG PO TABS
10.0000 mg | ORAL_TABLET | Freq: Every day | ORAL | Status: DC
  Administered 2018-06-18: 10 mg via ORAL
  Filled 2018-06-17: qty 1

## 2018-06-17 MED ORDER — MENTHOL 3 MG MT LOZG
1.0000 | LOZENGE | OROMUCOSAL | Status: DC | PRN
  Administered 2018-06-17: 3 mg via ORAL
  Filled 2018-06-17: qty 9

## 2018-06-17 MED ORDER — ONDANSETRON HCL 4 MG/2ML IJ SOLN: 4.0000 mg | Freq: Four times a day (QID) | INTRAMUSCULAR | Status: DC | PRN

## 2018-06-17 MED ORDER — ENOXAPARIN SODIUM 100 MG/ML ~~LOC~~ SOLN
100.0000 mg | SUBCUTANEOUS | Status: DC
  Administered 2018-06-18: 100 mg via SUBCUTANEOUS
  Filled 2018-06-17: qty 1

## 2018-06-17 MED ORDER — POLYETHYLENE GLYCOL 3350 17 G PO PACK: 17.0000 g | PACK | Freq: Every day | ORAL | Status: DC | PRN

## 2018-06-17 MED ORDER — HEMOSTATIC AGENTS (NO CHARGE) OPTIME
TOPICAL | Status: DC | PRN
  Administered 2018-06-17: 1 via TOPICAL

## 2018-06-17 MED ORDER — CARVEDILOL 6.25 MG PO TABS
6.2500 mg | ORAL_TABLET | Freq: Two times a day (BID) | ORAL | Status: DC
  Administered 2018-06-17 – 2018-06-18 (×2): 6.25 mg via ORAL
  Filled 2018-06-17 (×2): qty 1

## 2018-06-17 MED ORDER — POTASSIUM CHLORIDE CRYS ER 20 MEQ PO TBCR: 60.0000 meq | EXTENDED_RELEASE_TABLET | Freq: Every day | ORAL | Status: DC

## 2018-06-17 MED ORDER — THROMBIN 5000 UNITS EX SOLR
CUTANEOUS | Status: DC | PRN
  Administered 2018-06-17 (×2): 5000 [IU] via TOPICAL

## 2018-06-17 MED ORDER — BISACODYL 10 MG RE SUPP: 10.0000 mg | Freq: Every day | RECTAL | Status: DC | PRN

## 2018-06-17 MED ORDER — ATORVASTATIN CALCIUM 20 MG PO TABS
40.0000 mg | ORAL_TABLET | Freq: Every day | ORAL | Status: DC
  Administered 2018-06-17: 40 mg via ORAL
  Filled 2018-06-17: qty 2

## 2018-06-17 MED ORDER — FENTANYL CITRATE (PF) 250 MCG/5ML IJ SOLN
INTRAMUSCULAR | Status: AC
  Filled 2018-06-17: qty 5

## 2018-06-17 SURGICAL SUPPLY — 49 items
BAG DECANTER FOR FLEXI CONT (MISCELLANEOUS) ×3 IMPLANT
BLADE CLIPPER SURG (BLADE) IMPLANT
BLADE SURG 15 STRL LF DISP TIS (BLADE) ×1 IMPLANT
BLADE SURG 15 STRL SS (BLADE) ×2
BUR 2.5 MTCH HD 16 (BUR) ×4 IMPLANT
BUR 2.5MM MTCH HD 16CM (BUR) ×2
CANISTER SUCT 3000ML PPV (MISCELLANEOUS) ×3 IMPLANT
COVER WAND RF STERILE (DRAPES) ×3 IMPLANT
DECANTER SPIKE VIAL GLASS SM (MISCELLANEOUS) ×3 IMPLANT
DERMABOND ADVANCED (GAUZE/BANDAGES/DRESSINGS) ×2
DERMABOND ADVANCED .7 DNX12 (GAUZE/BANDAGES/DRESSINGS) ×1 IMPLANT
DRAPE C-ARM 42X72 X-RAY (DRAPES) ×6 IMPLANT
DRAPE LAPAROTOMY 100X72X124 (DRAPES) ×3 IMPLANT
DRAPE MICROSCOPE LEICA (MISCELLANEOUS) ×3 IMPLANT
DRAPE POUCH INSTRU U-SHP 10X18 (DRAPES) ×3 IMPLANT
DURAPREP 6ML APPLICATOR 50/CS (WOUND CARE) ×3 IMPLANT
ELECT BLADE 6.5 EXT (BLADE) ×3 IMPLANT
ELECT REM PT RETURN 9FT ADLT (ELECTROSURGICAL) ×3
ELECTRODE REM PT RTRN 9FT ADLT (ELECTROSURGICAL) ×1 IMPLANT
GAUZE 4X4 16PLY RFD (DISPOSABLE) IMPLANT
GLOVE BIOGEL PI IND STRL 7.5 (GLOVE) ×2 IMPLANT
GLOVE BIOGEL PI IND STRL 8.5 (GLOVE) ×1 IMPLANT
GLOVE BIOGEL PI INDICATOR 7.5 (GLOVE) ×4
GLOVE BIOGEL PI INDICATOR 8.5 (GLOVE) ×2
GLOVE ECLIPSE 8.5 STRL (GLOVE) ×3 IMPLANT
GLOVE EXAM NITRILE LRG STRL (GLOVE) IMPLANT
GLOVE EXAM NITRILE XL STR (GLOVE) IMPLANT
GLOVE EXAM NITRILE XS STR PU (GLOVE) IMPLANT
GOWN STRL REUS W/ TWL LRG LVL3 (GOWN DISPOSABLE) ×3 IMPLANT
GOWN STRL REUS W/ TWL XL LVL3 (GOWN DISPOSABLE) ×1 IMPLANT
GOWN STRL REUS W/TWL 2XL LVL3 (GOWN DISPOSABLE) ×3 IMPLANT
GOWN STRL REUS W/TWL LRG LVL3 (GOWN DISPOSABLE) ×6
GOWN STRL REUS W/TWL XL LVL3 (GOWN DISPOSABLE) ×2
HEMOSTAT POWDER SURGIFOAM 1G (HEMOSTASIS) ×3 IMPLANT
KIT BASIN OR (CUSTOM PROCEDURE TRAY) ×3 IMPLANT
KIT TURNOVER KIT B (KITS) ×3 IMPLANT
NEEDLE HYPO 18GX1.5 BLUNT FILL (NEEDLE) IMPLANT
NEEDLE HYPO 22GX1.5 SAFETY (NEEDLE) ×3 IMPLANT
NEEDLE SPNL 20GX3.5 QUINCKE YW (NEEDLE) ×3 IMPLANT
NS IRRIG 1000ML POUR BTL (IV SOLUTION) ×3 IMPLANT
PACK LAMINECTOMY NEURO (CUSTOM PROCEDURE TRAY) ×3 IMPLANT
PAD ARMBOARD 7.5X6 YLW CONV (MISCELLANEOUS) ×9 IMPLANT
RUBBERBAND STERILE (MISCELLANEOUS) ×6 IMPLANT
SPONGE SURGIFOAM ABS GEL SZ50 (HEMOSTASIS) ×3 IMPLANT
SUT VIC AB 3-0 SH 8-18 (SUTURE) ×3 IMPLANT
SYR 5ML LL (SYRINGE) IMPLANT
TOWEL GREEN STERILE (TOWEL DISPOSABLE) ×3 IMPLANT
TOWEL GREEN STERILE FF (TOWEL DISPOSABLE) ×3 IMPLANT
WATER STERILE IRR 1000ML POUR (IV SOLUTION) ×3 IMPLANT

## 2018-06-17 NOTE — Plan of Care (Signed)
  Problem: Education: Goal: Ability to verbalize activity precautions or restrictions will improve Outcome: Progressing Goal: Knowledge of the prescribed therapeutic regimen will improve Outcome: Progressing Goal: Understanding of discharge needs will improve Outcome: Progressing   

## 2018-06-17 NOTE — H&P (Signed)
CHIEF COMPLAINT: Back and left lower extremity pain down the anterior thigh and the shin, weakness in the left leg.  HISTORY OF PRESENT ILLNESS: [name] is a 77 year old right-handed individual who tells me that she has had some minor back issues in the past.  In July, though she was moving a refrigerator and she felt something pull and give in her back.  She subsequently developed significant pain radiating into that left lower extremity and this has been a persistent issue for her.  She was seen by Dr. Gladstone Lighter and ultimately a myelogram was ordered as she has a pacemaker present.  She has had open-heart surgery in the past.  She is on Coumadin for this process and the myelogram is reviewed in the office today.  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 On the postmyelogram CT, one can see the presence of a foraminal disc protrusion on the left side that elevates or causes compression for the exiting L3 nerve root superiorly.  The patient notes that she has had pain radiating down the anterior thigh.  It seems to center in and about the knee and causes a lot of knee pain, but also radiates down into the shin.  She feels that leg is weak and despite the passage of time now, she is having persistent symptoms.  PAST MEDICAL HISTORY: Reveals a history as noted.  She has had open-heart surgery and she has had mitral valve prolapse that has been repaired surgically.  CURRENT MEDICATIONS: Include Amoxicillin, Atorvastatin, Calcium Carbonate,  Carvedilil, Cholecalciferol, Fish Oil, Jardiance, Loperamide, Loratadine, Potassium Chloride Supplementation, Spironolactone,  Mavik, and Coumadin in addition to Gabapentin 300 mg which has been started 3 times a day.  SYSTEMS REVIEW: Notable for leg pain as the only singular complaint on a 14-point review sheet.  PHYSICAL EXAMINATION: She appears to demonstrate some weakness in the iliopsoas  and the quad on that left side.  Absent patellar reflex is noted on the left side.  Straight leg raising is positive at 15 degrees on the left side.  Patrick maneuver is negative.  IMPRESSION: The patient has evidence of significant L3 radiculopathy on the left side secondary to a foraminal disc protrusion at L3-4 on the left side.  I have advised that ultimately the patient needs to consider surgical decompression of the nerve because the size was rather prominent.  This surgery would be done via an extraforaminal approach using a METRx tube retractor.  I indicated that the surgery typically is done as an outpatient, but given her cardiac history, we want her to stay at least an overnight. Thereafter, the patient can resume her Coumadin and proceed with mobilization as she would tolerate as an outpatient.  I noted that we could consider an epidural steroid injection, but given the size of this disc and the fact that she has significant weakness in the leg, I do not believe that an epidural steroid injection is likely give her much relief.  She would have to be off the Coumadin for that, also.  In light of this process, I believe her best option would be with surgical decompression at the L3-4 level via an extraforaminal discectomy using a METRx retractor.  We will plan on scheduling this at the earliest convenience for her.

## 2018-06-17 NOTE — Progress Notes (Signed)
No ICM remote transmission received for 05/28/2018 and next ICM transmission scheduled for 06/27/2018.

## 2018-06-17 NOTE — Evaluation (Signed)
Physical Therapy Evaluation Patient Details Name: Anita Duke MRN: [id number] DOB: Mar 30, 1941 Today's Date: 06/17/2018   History of Present Illness  77 year old individual who has had severe back buttock and left lower extremity pain now s/p L3/L4 discectomy.  Clinical Impression  Patient seen for mobility assessment s/p spinal surgery. Mobilizing well. Educated patient on precautions, mobility expectations, safety and car transfers. Encouraged further ambulation with nursing. No further acute PT needs. Will sign off.    Follow Up Recommendations No PT follow up;Supervision - Intermittent    Equipment Recommendations  None recommended by PT    Recommendations for Other Services       Precautions / Restrictions Precautions Precautions: Back Precaution Booklet Issued: Yes (comment) Precaution Comments: verbally reviewed precautions Restrictions Weight Bearing Restrictions: No      Mobility  Bed Mobility Overal bed mobility: Needs Assistance Bed Mobility: Rolling;Sidelying to Sit;Sit to Sidelying Rolling: Supervision Sidelying to sit: Supervision     Sit to sidelying: Supervision General bed mobility comments: VCs for logroll and technique with spinal precautions. Increased effort to perform  Transfers Overall transfer level: Needs assistance Equipment used: 1 person hand held assist Transfers: Sit to/from Stand Sit to Stand: Min assist         General transfer comment: Min assist initially for stability when coming to upright, modest posterior list with LE resting against bedrail  Ambulation/Gait Ambulation/Gait assistance: Min assist;Min guard Gait Distance (Feet): 140 Feet Assistive device: 1 person hand held assist Gait Pattern/deviations: Step-through pattern;Decreased stride length;Antalgic;Drifts right/left Gait velocity: decreased Gait velocity interpretation: <1.8 ft/sec, indicate of risk for recurrent falls General Gait Details: Modest instability  during ambulation. HHA initially and VCs for increased cadence. Improved with distance  Stairs            Wheelchair Mobility    Modified Rankin (Stroke Patients Only)       Balance Overall balance assessment: Needs assistance   Sitting balance-Leahy Scale: Fair     Standing balance support: During functional activity Standing balance-Leahy Scale: Fair Standing balance comment: able to release single UE support and maintain standing without assist                             Pertinent Vitals/Pain Pain Assessment: Faces Faces Pain Scale: Hurts even more Pain Location: back Pain Descriptors / Indicators: Operative site guarding Pain Intervention(s): Monitored during session    Home Living Family/patient expects to be discharged to:: Private residence(independent living at [facility]) Living Arrangements: Alone Available Help at Discharge: Available PRN/intermittently Type of Home: Independent living facility Home Access: Level entry     Home Layout: One level Home Equipment: Shower seat - built in;Walker - 2 wheels;Cane - single point;Walker - 4 wheels;Grab bars - tub/shower      Prior Function Level of Independence: Independent               Hand Dominance        Extremity/Trunk Assessment   Upper Extremity Assessment Upper Extremity Assessment: Generalized weakness    Lower Extremity Assessment Lower Extremity Assessment: Generalized weakness;LLE deficits/detail LLE Deficits / Details: noted LLE antalgic gait upon functional movements LLE Coordination: WNL    Cervical / Trunk Assessment Cervical / Trunk Assessment: (s/p spinal surgery)  Communication   Communication: No difficulties  Cognition Arousal/Alertness: Awake/alert Behavior During Therapy: WFL for tasks assessed/performed Overall Cognitive Status: Within Functional Limits for tasks assessed Area of Impairment: Memory;Safety/judgement  Memory: Decreased recall of precautions   Safety/Judgement: Decreased awareness of safety            General Comments      Exercises     Assessment/Plan    PT Assessment Patent does not need any further PT services  PT Problem List         PT Treatment Interventions      PT Goals (Current goals can be found in the Care Plan section)  Acute Rehab PT Goals Patient Stated Goal: to go home PT Goal Formulation: All assessment and education complete, DC therapy    Frequency     Barriers to discharge        Co-evaluation               AM-PAC PT "6 Clicks" Daily Activity  Outcome Measure Difficulty turning over in bed (including adjusting bedclothes, sheets and blankets)?: A Little Difficulty moving from lying on back to sitting on the side of the bed? : A Little Difficulty sitting down on and standing up from a chair with arms (e.g., wheelchair, bedside commode, etc,.)?: A Little Help needed moving to and from a bed to chair (including a wheelchair)?: A Little Help needed walking in hospital room?: A Little Help needed climbing 3-5 steps with a railing? : A Little 6 Click Score: 18    End of Session   Activity Tolerance: Patient tolerated treatment well Patient left: in bed;with call bell/phone within reach;with family/visitor present;with SCD's reapplied Nurse Communication: Mobility status PT Visit Diagnosis: Difficulty in walking, not elsewhere classified (R26.2);Pain Pain - Right/Left: Left Pain - part of body: Leg    Time: 8032-1224 PT Time Calculation (min) (ACUTE ONLY): 18 min   Charges:   PT Evaluation $PT Eval Low Complexity: [place], PT DPT  Board Certified Neurologic Specialist Acute Rehabilitation Services Pager [phone] Office [place] 06/17/2018, 4:40 PM

## 2018-06-17 NOTE — Progress Notes (Signed)
Patient ID: Anita Duke, female   DOB: [birth year], 77 y.o.   MRN: [id number] Vital signs are stable Motor function appears good Still having left lower extremity pain We will see how she mobilizes.

## 2018-06-17 NOTE — Anesthesia Postprocedure Evaluation (Signed)
Anesthesia Post Note  Patient: Anita Duke  Procedure(s) Performed: left Lumbar three-fourextraforaminal Microdiscectomy with Met-Rx (Left Back)     Patient location during evaluation: PACU Anesthesia Type: General Level of consciousness: awake Pain management: pain level controlled Vital Signs Assessment: post-procedure vital signs reviewed and stable Respiratory status: spontaneous breathing Cardiovascular status: stable Postop Assessment: no headache Anesthetic complications: no    Last Vitals:  Vitals:   06/17/18 1431 06/17/18 1606  BP: (!) 167/70 (!) 155/60  Pulse: 69 68  Resp: 18 16  Temp: 36.4 C 36.8 C  SpO2: 96% 97%    Last Pain:  Vitals:   06/17/18 1700  TempSrc:   PainSc: 7                  CHARLENE GREEN     

## 2018-06-17 NOTE — Op Note (Signed)
  Date of surgery: 06/17/2018 Pre op Diagnosis: Lumbar herniated disc L3-L4 left with left lumbar radiculopathy  Post op Diagnosis:Lumbar herniated disc L3-L4 left with left lumbar radiculopathy  Procedure: Discectomy L3-4 left extraforaminal, using Metrix retractor and operating microscope  Surgeon:Karlo Goeden, Mallie Mussel, MD  Assistant: Earnie Larsson, MD  Anesthesia: General endotracheal  Indications: Anita Duke is a 77 year old individual who has had severe back buttock and left lower extremity pain.  She has advanced degenerative changes at multiple levels but L3-L4 on the left side in the foraminal zone there is a disc herniation in addition to significant spondylitic disease.  The disc herniation appears to be compressing the path of the exiting L3 nerve root.  She is been advised regarding the surgical decompression.  Procedure: The patient was brought to the operating room supine on a stretcher. After the smooth induction of general endotracheal anesthesia the patient was turned prone onto the operating table. The bony prominences were appropriately padded and protected. The back was prepped with alcohol and DuraPrep and draped sterilely. Fluoroscopic guidance was used to localize the appropriate level 3 for. The skin was infiltrated with 5 cc of lidocaine with epinephrine mixed 50-50 with half percent Marcaine. A small vertical incision an inch in length was created. A K wire was passed to the laminar arch of L4 and a wanding technique was used to create a space around the lamina and pars. Dilators were then passed to dilate to the 20 mm diameter. A 6 centimeter by 22 millimeter endoscopic cannula was fixed to the operating table with a clamp.  The operating microscope was brought into the field. Through this aperture the soft tissues were cleared from the lateral aspect of the pars and the superior articular process of the facet joint. The intertransverse space was cleared of soft tissues. A curette  was used to remove remnants of soft tissue and then a high-speed drill was used to remove the lateral aspect of the pars and the superior articular process. The undersurface of the lateral aspect was then examined. A 2 and 3 mm Kerrison punch was used to remove further bone from the lateral aspect of the pars to allow exploration of the exiting L3 nerve root. The nerve root was mobilized from above and below and a mass of the disc was identified. The disc space was incised with a #15 blade and  disc material was removed from this area. The nerve root was carefully protected and dissection about the nerve root was performed both from the inferior aspect of the nerve and the superior aspect of the nerve. A series of ball dissectors were used to facilitate removal and freeing of any loose disc material. In the end the nerve root was decompressed of the surrounding disc material and its path was free and clear into the extraforaminal zone. Hemostasis was obtained using small pledgets of Gelfoam and careful bipolar cautery.  When adequate hemostasis was achieved the endoscopic cannula was removed, the microscope was removed, and closure of the incision was undertaken with 3-0 Vicryl in the fascia and 3-0 Vicryl in the subcuticular skin. Dermabond was used to cover the skin.Blood loss was 20 mL. The patient tolerated the procedure and was returned to the recovery room in stable condition.

## 2018-06-17 NOTE — Anesthesia Procedure Notes (Signed)
Procedure Name: Intubation Date/Time: 06/17/2018 11:41 AM Performed by: Lavell Luster, CRNA Pre-anesthesia Checklist: Patient identified, Emergency Drugs available, Suction available, Patient being monitored and Timeout performed Patient Re-evaluated:Patient Re-evaluated prior to induction Oxygen Delivery Method: Circle system utilized Preoxygenation: Pre-oxygenation with 100% oxygen Induction Type: IV induction Ventilation: Mask ventilation without difficulty Laryngoscope Size: Mac and 3 Grade View: Grade I Tube type: Oral Tube size: 7.0 mm Number of attempts: 1 Airway Equipment and Method: Stylet Placement Confirmation: ETT inserted through vocal cords under direct vision,  positive ETCO2 and breath sounds checked- equal and bilateral Secured at: 21 cm Tube secured with: Tape Dental Injury: Teeth and Oropharynx as per pre-operative assessment

## 2018-06-17 NOTE — Anesthesia Preprocedure Evaluation (Addendum)
Anesthesia Evaluation  Patient identified by MRN, date of birth, ID band  History of Anesthesia Complications (+) PONV  Airway Mallampati: II  TM Distance: >3 FB     Dental  (+) Dental Advisory Given, Chipped, Teeth Intact   Pulmonary asthma ,    breath sounds clear to auscultation       Cardiovascular hypertension, + CAD and + DVT  + dysrhythmias + pacemaker  Rhythm:Regular Rate:Normal     Neuro/Psych    GI/Hepatic GERD  ,  Endo/Other  diabetes  Renal/GU      Musculoskeletal   Abdominal   Peds  Hematology   Anesthesia Other Findings   Reproductive/Obstetrics                            Anesthesia Physical Anesthesia Plan  ASA: III  Anesthesia Plan: General   Post-op Pain Management:    Induction: Intravenous  PONV Risk Score and Plan: Ondansetron, Dexamethasone, Midazolam and Treatment may vary due to age or medical condition  Airway Management Planned: Oral ETT  Additional Equipment:   Intra-op Plan:   Post-operative Plan: Possible Post-op intubation/ventilation  Informed Consent: I have reviewed the patients History and Physical, chart, labs and discussed the procedure including the risks, benefits and alternatives for the proposed anesthesia with the patient or authorized representative who has indicated his/her understanding and acceptance.   Dental advisory given  Plan Discussed with: CRNA  Anesthesia Plan Comments:         Anesthesia Quick Evaluation

## 2018-06-17 NOTE — Transfer of Care (Signed)
Immediate Anesthesia Transfer of Care Note  Patient: Anita Duke  Procedure(s) Performed: left Lumbar three-fourextraforaminal Microdiscectomy with Met-Rx (Left Back)  Patient Location: PACU  Anesthesia Type:General  Level of Consciousness: awake, alert  and oriented  Airway & Oxygen Therapy: Patient connected to face mask oxygen  Post-op Assessment: Post -op Vital signs reviewed and stable  Post vital signs: stable  Last Vitals:  Vitals Value Taken Time  BP 152/66 06/17/2018  1:06 PM  Temp    Pulse 70 06/17/2018  1:08 PM  Resp 14 06/17/2018  1:08 PM  SpO2 99 % 06/17/2018  1:08 PM  Vitals shown include unvalidated device data.  Last Pain:  Vitals:   06/17/18 0944  TempSrc:   PainSc: 0-No pain         Complications: No apparent anesthesia complications

## 2018-06-18 ENCOUNTER — Encounter (HOSPITAL_COMMUNITY): Payer: Self-pay | Admitting: Neurological Surgery

## 2018-06-18 DIAGNOSIS — Z79899 Other long term (current) drug therapy: Secondary | ICD-10-CM | POA: Diagnosis not present

## 2018-06-18 DIAGNOSIS — M5116 Intervertebral disc disorders with radiculopathy, lumbar region: Secondary | ICD-10-CM | POA: Diagnosis not present

## 2018-06-18 DIAGNOSIS — Z7901 Long term (current) use of anticoagulants: Secondary | ICD-10-CM | POA: Diagnosis not present

## 2018-06-18 DIAGNOSIS — M4726 Other spondylosis with radiculopathy, lumbar region: Secondary | ICD-10-CM | POA: Diagnosis not present

## 2018-06-18 DIAGNOSIS — I341 Nonrheumatic mitral (valve) prolapse: Secondary | ICD-10-CM | POA: Diagnosis not present

## 2018-06-18 DIAGNOSIS — Z950 Presence of cardiac pacemaker: Secondary | ICD-10-CM | POA: Diagnosis not present

## 2018-06-18 LAB — GLUCOSE, CAPILLARY
Glucose-Capillary: 132 mg/dL — ABNORMAL HIGH (ref 70–99)
Glucose-Capillary: 147 mg/dL — ABNORMAL HIGH (ref 70–99)

## 2018-06-18 MED ORDER — GABAPENTIN 300 MG PO CAPS: ORAL_CAPSULE | ORAL | 3 refills | Status: DC

## 2018-06-18 MED ORDER — ALUM & MAG HYDROXIDE-SIMETH 200-200-20 MG/5ML PO SUSP
30.0000 mL | ORAL | Status: DC | PRN
  Administered 2018-06-18: 30 mL via ORAL
  Filled 2018-06-18: qty 30

## 2018-06-18 MED ORDER — HYDROCODONE-ACETAMINOPHEN 5-325 MG PO TABS: 1.0000 | ORAL_TABLET | ORAL | 0 refills | Status: DC | PRN

## 2018-06-18 MED ORDER — METHOCARBAMOL 500 MG PO TABS: 500.0000 mg | ORAL_TABLET | Freq: Four times a day (QID) | ORAL | 3 refills | Status: DC | PRN

## 2018-06-18 MED FILL — METHOCARBAMOL 500 MG TABLET: 500 | 7 days supply | Qty: 30 | Fill #0

## 2018-06-18 MED FILL — HYDROCODON-APAP 5-325: 5-325 | 4 days supply | Qty: 40 | Fill #0

## 2018-06-18 NOTE — Evaluation (Signed)
Occupational Therapy Evaluation Patient Details Name: Anita Duke MRN: [id number] DOB: 11/11/1940 Today's Date: 06/18/2018    History of Present Illness 77 year old individual who has had severe back buttock and left lower extremity pain now s/p L3/L4 discectomy.   Clinical Impression   PTA, pt was living alone and was independent; resident at [facility]. Currently, pt performing ADLs and functional mobility at [place] level. Provided education on back precautions, brace management, bed mobility, LB ADLs, toileting, and shower transfer; pt demonstrated understanding. Pt reporting she may want to stay at [facility] "wellness center/rehab" at dc since she lives alone; discussed dc options. Answered all pt questions. Recommend dc home once medically stable per physician. All acute OT needs met and will sign off. Thank you.    Follow Up Recommendations  Follow surgeon's recommendation for DC plan and follow-up therapies;Supervision - Intermittent    Equipment Recommendations  None recommended by OT    Recommendations for Other Services       Precautions / Restrictions Precautions Precautions: Back Precaution Booklet Issued: Yes (comment) Precaution Comments: Pt able to recall 3/3 precautions.  Restrictions Weight Bearing Restrictions: No      Mobility Bed Mobility Overal bed mobility: Needs Assistance Bed Mobility: Rolling;Sidelying to Sit;Sit to Sidelying Rolling: Supervision Sidelying to sit: Supervision     Sit to sidelying: Supervision General bed mobility comments: Reviewed log roll technique  Transfers Overall transfer level: Needs assistance Equipment used: None Transfers: Sit to/from Stand Sit to Stand: Supervision         General transfer comment: supervision for safety    Balance Overall balance assessment: Needs assistance Sitting-balance support: No upper extremity supported;Feet supported Sitting balance-Leahy Scale: Good     Standing balance support: During functional activity Standing balance-Leahy Scale: Fair Standing balance comment: Able to perform shower transfer                           ADL either performed or assessed with clinical judgement   ADL Overall ADL's : Needs assistance/impaired                                       General ADL Comments: Pt performing ADLs and functional mobility at [facility] A level. Providing education on back precautions, brace managment, bed mobility, LB ADLs, toileting, and shower transfer. Pt demonstrating understanding.      Vision         Perception     Praxis      Pertinent Vitals/Pain Pain Assessment: Faces Faces Pain Scale: Hurts little more Pain Location: back Pain Descriptors / Indicators: Operative site guarding;Sore Pain Intervention(s): Monitored during session;Repositioned     Hand Dominance     Extremity/Trunk Assessment Upper Extremity Assessment Upper Extremity Assessment: Generalized weakness   Lower Extremity Assessment Lower Extremity Assessment: Defer to PT evaluation   Cervical / Trunk Assessment Cervical / Trunk Assessment: Other exceptions Cervical / Trunk Exceptions: s/p spinal surgery   Communication Communication Communication: No difficulties   Cognition Arousal/Alertness: Awake/alert Behavior During Therapy: WFL for tasks assessed/performed Overall Cognitive Status: Within Functional Limits for tasks assessed                                 General Comments: Pt nervious about breaking back precautions. Pt also stating she  feels "loopy" due to medication    General Comments  Pt reporting she wants to go to "wellness center" (SNF level rehab) for a coupel of days at dc since she lives alone. Discussed options and that she is performing ADLs well.     Exercises     Shoulder Instructions      Home Living Family/patient expects to be discharged to:: Private  residence(independent living at [facility]) Living Arrangements: Alone Available Help at Discharge: Available PRN/intermittently Type of Home: Independent living facility Home Access: Level entry     Home Layout: One level     Bathroom Shower/Tub: Occupational psychologist: Handicapped height     Home Equipment: Shower seat - built in;Walker - 2 wheels;Cane - single point;Walker - 4 wheels;Grab bars - tub/shower   Additional Comments: [facility]      Prior Functioning/Environment Level of Independence: Independent                 OT Problem List: Decreased activity tolerance;Decreased range of motion;Decreased knowledge of precautions;Decreased knowledge of use of DME or AE;Pain      OT Treatment/Interventions:      OT Goals(Current goals can be found in the care plan section) Acute Rehab OT Goals Patient Stated Goal: to go home OT Goal Formulation: All assessment and education complete, DC therapy  OT Frequency:     Barriers to D/C:            Co-evaluation              AM-PAC PT "6 Clicks" Daily Activity     Outcome Measure Help from another person eating meals?: None Help from another person taking care of personal grooming?: None Help from another person toileting, which includes using toliet, bedpan, or urinal?: A Little Help from another person bathing (including washing, rinsing, drying)?: None Help from another person to put on and taking off regular upper body clothing?: None Help from another person to put on and taking off regular lower body clothing?: A Little 6 Click Score: 22   End of Session Nurse Communication: Mobility status  Activity Tolerance: Patient tolerated treatment well Patient left: in bed;with call bell/phone within reach  OT Visit Diagnosis: Other abnormalities of gait and mobility (R26.89);Pain;Muscle weakness (generalized) (M62.81) Pain - part of body: (Back)                Time: 2458-0998 OT Time  Calculation (min): 16 min Charges:  OT General Charges $OT Visit: 1 Visit OT Evaluation $OT Eval Low Complexity: Rew, OTR/L Acute Rehab Pager: [phone] Office: [facility] [place] 06/18/2018, 8:43 AM

## 2018-06-18 NOTE — Clinical Social Work Note (Signed)
Clinical Social Work Assessment  Patient Details  Name: Anita Duke MRN: [id number] Date of Birth: 1940/10/28  Date of referral:  06/18/18               Reason for consult:  Facility Placement, Discharge Planning                Permission sought to share information with:  Facility Sport and exercise psychologist, Family Supports Permission granted to share information::  Yes, Verbal Permission Granted  Name::     [name]  Agency::  [facility]  Relationship::     Contact Information:  [phone]  Housing/Transportation Living arrangements for the past 2 months:  [place] of Information:  Patient Patient Interpreter Needed:  None Criminal Activity/Legal Involvement Pertinent to Current Situation/Hospitalization:  No - Comment as needed Significant Relationships:  Other Family Members, Friend Lives with:  Self Do you feel safe going back to the place where you live?  Yes Need for family participation in patient care:  No (Coment)  Care giving concerns: Patient from [place], planning to use respite days at [facility] prior to return to [place].   Social Worker assessment / plan: CSW met with patient and patient's relative, Pamala Hurry, at bedside. Patient alert and oriented. CSW introduced self and role and discussed disposition planning. Patient indicated she wants to go to [facility] for respite for a few days and has discussed it with Claiborne Billings in admissions at the facility. Patient indicated that Pamala Hurry can drive her to [facility] today.  CSW did speak to [facility] in admissions at the facility and confirmed patient's respite bed. No UHC authorization required. Facility is ready to accept patient this afternoon. CSW to support with discharge.   Employment status:  Retired Research officer, political party) PT Recommendations:  No Follow Up Information / Referral to community resources:  [place]  Patient/Family's Response  to care: Patient appreciative of care.  Patient/Family's Understanding of and Emotional Response to Diagnosis, Current Treatment, and Prognosis: Patient with understanding of her condition and planning to go to [facility] SNF for respite before returning to [place].  Emotional Assessment Appearance:  Appears stated age Attitude/Demeanor/Rapport:  Engaged Affect (typically observed):  Accepting, Calm, Appropriate, Pleasant Orientation:  Oriented to Self, Oriented to Place, Oriented to  Time, Oriented to Situation Alcohol / Substance use:  Not Applicable Psych involvement (Current and /or in the community):  No (Comment)  Discharge Needs  Concerns to be addressed:  Discharge Planning Concerns, Care Coordination Readmission within the last 30 days:  No Current discharge risk:  None Barriers to Discharge:  No Barriers Identified   Estanislado Emms, LCSW 06/18/2018, 11:15 AM

## 2018-06-18 NOTE — Clinical Social Work Placement (Signed)
   CLINICAL SOCIAL WORK PLACEMENT  NOTE  Date:  06/18/2018  Patient Details  Name: Anita Duke MRN: [id number] Date of Birth: 1940/09/24  Clinical Social Work is seeking post-discharge placement for this patient at the [place] level of care (*CSW will initial, date and re-position this form in  chart as items are completed):  Yes   Patient/family provided with [place] Work Department's list of facilities offering this level of care within the geographic area requested by the patient (or if unable, by the patient's family).  Yes   Patient/family informed of their freedom to choose among providers that offer the needed level of care, that participate in Medicare, Medicaid or managed care program needed by the patient, have an available bed and are willing to accept the patient.  Yes   Patient/family informed of [place]'s ownership interest in [facility] and [facility], as well as of the fact that they are under no obligation to receive care at these facilities.  PASRR submitted to EDS on 06/18/18     PASRR number received on 06/18/18     Existing PASRR number confirmed on       FL2 transmitted to all facilities in geographic area requested by pt/family on 06/18/18     FL2 transmitted to all facilities within larger geographic area on       Patient informed that his/her managed care company has contracts with or will negotiate with certain facilities, including the following:  [facility]     Yes   Patient/family informed of bed offers received.  Patient chooses bed at [facility]     Physician recommends and patient chooses bed at      Patient to be transferred to [facility] on 06/18/18.  Patient to be transferred to facility by friend will drive patient     Patient family notified on 06/18/18 of transfer.  Name of family member notified:  [name]     PHYSICIAN       Additional Comment:     _______________________________________________ Estanislado Emms, LCSW 06/18/2018, 11:20 AM

## 2018-06-18 NOTE — Progress Notes (Signed)
Patient will discharge to [facility]. Anticipated discharge date: 06/18/18 Family notified: [name]. Transportation by: Pamala Hurry will drive patient to the facility.  Nurse to call report to [phone].   CSW signing off.  Estanislado Emms, [place]  Clinical Social Worker

## 2018-06-18 NOTE — Progress Notes (Signed)
Patient is discharged from room 3C08 at this time. Alert and in stable condition. IV site d/c'd and instructions read to patient and cousin with understanding verbalized. Left unit via wheelchair with all belongings at side.

## 2018-06-18 NOTE — Discharge Instructions (Signed)
Wound Care Leave incision open to air. You may shower. Do not scrub directly on incision.  Do not put any creams, lotions, or ointments on incision. Activity Walk each and every day, increasing distance each day. No lifting greater than 5 lbs.  Avoid excessive neck motion. No driving for 2 weeks; may ride as a passenger locally. Diet Resume your normal diet.  Return to Work Will be discussed at you follow up appointment. Call Your Doctor If Any of These Occur Redness, drainage, or swelling at the wound.  Temperature greater than 101 degrees. Severe pain not relieved by pain medication. Increased difficulty swallowing. Incision starts to come apart. Follow Up Appt Call today for appointment in 2-4 weeks [phone]) or for problems.   

## 2018-06-18 NOTE — Progress Notes (Signed)
Pt complaints of left chest pressure radiating to her left arm and back; relating to her history of heart issues (A-fib and a permanent pacemaker), she requested if she can have an EKG done. VS taken BP 150/60, T-97.5, PR 69 and sat 99% on room air taken at that moment. NT loaned an EKG machine from another unit. Will continue to monitor.

## 2018-06-18 NOTE — NC FL2 (Signed)
[place] LEVEL OF CARE SCREENING TOOL     IDENTIFICATION  Patient Name: Anita Duke Birthdate: 06/21/1941 Sex: female Admission Date (Current Location): 06/17/2018  [facility] and [place] Number:  Herbalist and Address:  [facility] [place], [place] [address] [place], [place] [address]      Provider Number: [id number]  Attending Physician Name and Address:  Kristeen Miss, MD  Relative Name and Phone Number:       Current Level of Care: Hospital Recommended Level of Care: [place] Prior Approval Number:    Date Approved/Denied:   PASRR Number: [id number]  Discharge Plan: SNF    Current Diagnoses: Patient Active Problem List   Diagnosis Date Noted  . Lumbar radiculopathy, acute 06/17/2018  . Sweating abnormality 04/24/2018  . Diabetic neuropathy ([place]) 02/21/2018  . Long term (current) use of anticoagulants 06/01/2017  . 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 Excessive sweating 07/07/2016  . Osteopenia 01/10/2016  . Encounter for therapeutic drug monitoring 10/08/2015  . Heat intolerance 10/08/2015  . CAD (coronary artery disease) 05/28/2014  . Vitamin D deficiency 02/20/2014  . Long term current use of anticoagulant therapy 11/24/2013  . Chronic systolic dysfunction of left ventricle 05/21/2013  . 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 Acquired complete AV block   . Sick sinus syndrome ([place])   . Pacemaker-Medtronic 05/20/2012  . Allergic rhinitis 04/25/2012  . Mild intermittent asthma 02/16/2011  . Ejection fraction < 50%   . Atrial fibrillation ([place])   . Mitral regurgitation   . Right ventricular dysfunction   . Mitral stenosis   . S/P mitral valve repair   . Hx of CABG   . Diabetes type II 03/02/2009  . Hyperlipidemia 07/07/2008  . Essential hypertension 07/07/2008  . Diverticulosis of large intestine 08/04/2002    Orientation RESPIRATION BLADDER Height & Weight      Self, Time, Situation, Place  Normal Continent Weight: 150 lb 12.8 oz (68.4 kg) Height:  5\' 2"  (157.5 cm)  BEHAVIORAL SYMPTOMS/MOOD NEUROLOGICAL BOWEL NUTRITION STATUS      Continent Diet(please see DC summary)  AMBULATORY STATUS COMMUNICATION OF NEEDS Skin   Limited Assist Verbally Surgical wounds(closed incision on back)                       Personal Care Assistance Level of Assistance  Bathing, Feeding, Dressing Bathing Assistance: Limited assistance Feeding assistance: Independent Dressing Assistance: Limited assistance     Functional Limitations Info  Sight, Hearing, Speech Sight Info: Adequate Hearing Info: Adequate Speech Info: Adequate    SPECIAL CARE FACTORS FREQUENCY  PT (By licensed PT), OT (By licensed OT)     PT Frequency: 5x/week OT Frequency: 5x/week            Contractures Contractures Info: Not present    Additional Factors Info  Code Status, Allergies, Insulin Sliding Scale Code Status Info: Full Allergies Info: Demerol Meperidine, Morphine, Januvia Sitagliptin, Metformin And Related, Tetanus Toxoid   Insulin Sliding Scale Info: novolog 3x/day with meals and at bedtime       Current Medications (06/18/2018):  This is the current hospital active medication list Current Facility-Administered Medications  Medication Dose Route Frequency Provider Last Rate Last Dose  . 0.9 %  sodium chloride infusion  250 mL Intravenous Continuous Kristeen Miss, MD      . acetaminophen (TYLENOL) tablet 650 mg  650 mg Oral Q4H PRN Kristeen Miss, MD  Or  . acetaminophen (TYLENOL) suppository 650 mg  650 mg Rectal Q4H PRN Kristeen Miss, MD      . alum & mag hydroxide-simeth (MAALOX/MYLANTA) 200-200-20 MG/5ML suspension 30 mL  30 mL Oral Q4H PRN Kristeen Miss, MD   30 mL at 06/18/18 0355  . atorvastatin (LIPITOR) tablet 40 mg  40 mg Oral q1800 Kristeen Miss, MD   40 mg at 06/17/18 1700  . bisacodyl (DULCOLAX) suppository 10 mg  10 mg Rectal Daily PRN Kristeen Miss, MD      . canagliflozin [facility]) tablet 100 mg  100 mg Oral QAC breakfast Kristeen Miss, MD   100 mg at 06/18/18 0601  . carvedilol (COREG) tablet 6.25 mg  6.25 mg Oral BID WC Kristeen Miss, MD   6.25 mg at 06/18/18 0915  . docusate sodium (COLACE) capsule 100 mg  100 mg Oral BID Kristeen Miss, MD   100 mg at 06/18/18 0914  . enoxaparin (LOVENOX) injection 100 mg  100 mg Subcutaneous Q24H Kristeen Miss, MD      . gabapentin (NEURONTIN) capsule 300 mg  300 mg Oral Daily Kristeen Miss, MD   300 mg at 06/18/18 5956   And  . gabapentin (NEURONTIN) capsule 600 mg  600 mg Oral Antoine Poche, MD   600 mg at 06/17/18 2032  . HYDROcodone-acetaminophen (NORCO/VICODIN) 5-325 MG per tablet 1-2 tablet  1-2 tablet Oral Q4H PRN Kristeen Miss, MD   1 tablet at 06/18/18 0914  . insulin aspart (novoLOG) injection 0-15 Units  0-15 Units Subcutaneous TID WC Kristeen Miss, MD   2 Units at 06/18/18 0750  . insulin aspart (novoLOG) injection 0-5 Units  0-5 Units Subcutaneous QHS Kristeen Miss, MD      . loperamide (IMODIUM) capsule 2 mg  2 mg Oral Daily PRN Kristeen Miss, MD      . loratadine (CLARITIN) tablet 10 mg  10 mg Oral Daily Kristeen Miss, MD   10 mg at 06/18/18 0915  . menthol-cetylpyridinium (CEPACOL) lozenge 3 mg  1 lozenge Oral PRN Kristeen Miss, MD   3 mg at 06/17/18 1701   Or  . phenol (CHLORASEPTIC) mouth spray 1 spray  1 spray Mouth/Throat PRN Kristeen Miss, MD      . methocarbamol (ROBAXIN) tablet 500 mg  500 mg Oral Q6H PRN Kristeen Miss, MD   500 mg at 06/18/18 0915   Or  . methocarbamol (ROBAXIN) 500 mg in dextrose 5 % 50 mL IVPB  500 mg Intravenous Q6H PRN Kristeen Miss, MD      . morphine 2 MG/ML injection 2 mg  2 mg Intravenous Q2H PRN Kristeen Miss, MD      . ondansetron (ZOFRAN) tablet 4 mg  4 mg Oral Q6H PRN Kristeen Miss, MD       Or  . ondansetron (ZOFRAN) injection 4 mg  4 mg Intravenous Q6H PRN Kristeen Miss, MD      . polyethylene glycol (MIRALAX / GLYCOLAX) packet 17 g   17 g Oral Daily PRN Kristeen Miss, MD      . senna (SENOKOT) tablet 8.6 mg  1 tablet Oral BID Kristeen Miss, MD   8.6 mg at 06/18/18 0915  . sodium chloride flush (NS) 0.9 % injection 3 mL  3 mL Intravenous Austin Miles, MD   3 mL at 06/17/18 2055  . sodium chloride flush (NS) 0.9 % injection 3 mL  3 mL Intravenous PRN Kristeen Miss, MD      . sodium phosphate (FLEET)  7-19 GM/118ML enema 1 enema  1 enema Rectal Once PRN Kristeen Miss, MD      . spironolactone (ALDACTONE) tablet 12.5 mg  12.5 mg Oral BID Kristeen Miss, MD   12.5 mg at 06/18/18 0914  . trandolapril (MAVIK) tablet 4 mg  4 mg Oral BID Kristeen Miss, MD   4 mg at 06/18/18 6283     Discharge Medications: Please see discharge summary for a list of discharge medications.  Relevant Imaging Results:  Relevant Lab Results:   Additional Information SSN: [id number]  Estanislado Emms, LCSW

## 2018-06-18 NOTE — Discharge Summary (Signed)
Physician Discharge Summary  Patient ID: Anita Duke MRN: [id number] DOB/AGE: [birth year] 78 y.o.  Admit date: 06/17/2018 Discharge date: 06/18/2018  Admission Diagnoses: Herniated nucleus pulposus L3-4 left with left lumbar radiculopathy  Discharge Diagnoses: Needed nucleus pulposus L3-4 left with left lumbar radiculopathy Active Problems:   Lumbar radiculopathy, acute   Discharged Condition: good  Hospital Course: Patient was admitted to undergo surgical microdiscectomy at L3-4 left in the extraforaminal zone using a Metrix technique in the operating microscope.  She tolerated surgery well  Consults: None  Significant Diagnostic Studies: None  Treatments: surgery: Microdiscectomy L3-4 left extraforaminal with Metrix technique operating microscope.  Discharge Exam: Blood pressure 140/72, pulse 73, temperature 97.8 F (36.6 C), temperature source Oral, resp. rate 16, height _0  (1.575 m), weight 68.4 kg, SpO2 97 %. Incision is clean and dry Station and gait are intact  Disposition: Discharge disposition: 01-Home or Self Care       Discharge Instructions    Call MD for:  redness, tenderness, or signs of infection (pain, swelling, redness, odor or green/yellow discharge around incision site)   Complete by:  As directed    Call MD for:  severe uncontrolled pain   Complete by:  As directed    Call MD for:  temperature >100.4   Complete by:  As directed    Diet - low sodium heart healthy   Complete by:  As directed    Discharge instructions   Complete by:  As directed    Okay to shower. Do not apply salves or appointments to incision. No heavy lifting with the upper extremities greater than 15 pounds. May resume driving when not requiring pain medication and patient feels comfortable with doing so.  Sit straight walk straight stand straight major posture.   Incentive spirometry RT   Complete by:  As directed    Increase activity slowly   Complete by:  As directed      Allergies as of 06/18/2018      Reactions   Demerol [meperidine] Nausea And Vomiting   Morphine Nausea And Vomiting   Januvia [sitagliptin] Other (See Comments)   Headaches, did not feel well   Metformin And Related Diarrhea   Tetanus Toxoid Rash, Other (See Comments)   "years ago"      Medication List    TAKE these medications   ACCU-CHEK AVIVA PLUS w/Device Kit Use to check blood sugars daily Dx E11.9   ACCU-CHEK SOFTCLIX LANCETS lancets USE TO CHECK BLOOD SUGARS  TWICE A DAY   acetaminophen 325 MG tablet Commonly known as:  TYLENOL Take 650 mg by mouth every 6 (six) hours as needed (pain).   amoxicillin 500 MG capsule Commonly known as:  AMOXIL Take 4 tabs prior to dental appointments   atorvastatin 40 MG tablet Commonly known as:  LIPITOR TAKE 1 TABLET BY MOUTH  DAILY   carvedilol 6.25 MG tablet Commonly known as:  COREG TAKE 1 TABLET BY MOUTH  TWICE A DAY WITH MEALS   cholecalciferol 1000 units tablet Commonly known as:  VITAMIN D Take 6,000 Units by mouth daily.   enoxaparin 100 MG/ML injection Commonly known as:  LOVENOX Inject 1 mL (100 mg total) into the skin daily.   fish oil-omega-3 fatty acids 1000 MG capsule Take 1 g by mouth 2 (two) times daily.   gabapentin 300 MG capsule Commonly known as:  NEURONTIN 2 capsules at bedtime 1 in the morning.  Taper by 1 capsule every week for the next 3  weeks What changed:    how much to take  how to take this  when to take this  additional instructions   glucose blood test strip Accu-Chek Aviva Plus test strips   glucose blood test strip USE 2 TIMES DAILY AS  DIRECTED   HYDROcodone-acetaminophen 5-325 MG tablet Commonly known as:  NORCO/VICODIN Take 1-2 tablets by mouth every 4 (four) hours as needed for severe pain ((score 7 to 10)).   JARDIANCE 25 MG Tabs tablet Generic drug:  empagliflozin TAKE 1 TABLET BY MOUTH  DAILY.   loperamide 2 MG capsule Commonly known as:  IMODIUM Take 1  capsule (2 mg total) by mouth daily as needed for diarrhea or loose stools.   loratadine 10 MG tablet Commonly known as:  CLARITIN Take 10 mg by mouth daily.   methocarbamol 500 MG tablet Commonly known as:  ROBAXIN Take 1 tablet (500 mg total) by mouth every 6 (six) hours as needed for muscle spasms.   potassium chloride SA 20 MEQ tablet Commonly known as:  K-DUR,KLOR-CON TAKE 3 TABLETS BY MOUTH  DAILY   spironolactone 25 MG tablet Commonly known as:  ALDACTONE Take 0.5 tablets (12.5 mg total) by mouth daily. What changed:  when to take this   trandolapril 4 MG tablet Commonly known as:  MAVIK TAKE 1 TABLET BY MOUTH TWO  TIMES DAILY FOR BLOOD  PRESSURE What changed:  See the new instructions.   warfarin 2.5 MG tablet Commonly known as:  COUMADIN Take as directed. If you are unsure how to take this medication, talk to your nurse or doctor. Original instructions:  TAKE AS DIRECTED BY  ANTICOAGULATION CLINIC What changed:    how much to take  how to take this  when to take this        Signed: Suhey Radford J 06/18/2018, 10:06 AM

## 2018-06-18 NOTE — Progress Notes (Signed)
Pt's EKG result attached to pt's paper chart and transmitted electronically on E-chart for MD's interpretation in AM. Meanwhile, pt was offered Maalox to rule out heartburn or gas pain. Will continue to monitor pt's progress.

## 2018-06-18 NOTE — Progress Notes (Signed)
Just spoke with the patient in her room and asked about her pressure in her left chest and stated she felt relieved and that the pressure was gone. She just got back from ambulating down the hallway with the NT. Will continue monitoring patient.

## 2018-06-19 ENCOUNTER — Telehealth: Payer: Self-pay | Admitting: *Deleted

## 2018-06-19 NOTE — Telephone Encounter (Signed)
Pt was on TCM report admitted 06/17/18 to undergo surgical microdiscectomy at L3-4 left in the extraforaminal zone using a Metrix technique in the operating microscope.  She tolerated surgery well, and was D/c 06/18/18 pt will f/u w/surgeon in 2 weeks.Marland KitchenJohny Chess

## 2018-06-20 ENCOUNTER — Telehealth: Payer: Self-pay | Admitting: Internal Medicine

## 2018-06-20 NOTE — Telephone Encounter (Signed)
Copied from [place] [id number]. Topic: Quick Communication - See Telephone Encounter >> Jun 20, 2018  9:48 AM Margot Ables wrote: CRM for notification. See Telephone encounter for: 06/20/18. Pt called stating that she has been sick vomitting and not feeling well. She cancelled appt 10/18 for coumadin check. She said that she had a surgery and had medicine schedule thru today. The [facility]st living [facility]irement facility she lives in has checked her protime before and thinks they can check it if Jenny Reichmann can give her an update on how to take her medicine. Pt requesting call back to advise.

## 2018-06-21 ENCOUNTER — Ambulatory Visit: Payer: Medicare Other

## 2018-06-21 DIAGNOSIS — Z79899 Other long term (current) drug therapy: Secondary | ICD-10-CM | POA: Diagnosis not present

## 2018-06-21 DIAGNOSIS — D649 Anemia, unspecified: Secondary | ICD-10-CM | POA: Diagnosis not present

## 2018-06-21 DIAGNOSIS — E088 Diabetes mellitus due to underlying condition with unspecified complications: Secondary | ICD-10-CM | POA: Diagnosis not present

## 2018-06-21 DIAGNOSIS — I10 Essential (primary) hypertension: Secondary | ICD-10-CM | POA: Diagnosis not present

## 2018-06-21 DIAGNOSIS — I501 Left ventricular failure: Secondary | ICD-10-CM | POA: Diagnosis not present

## 2018-06-21 DIAGNOSIS — Z7901 Long term (current) use of anticoagulants: Secondary | ICD-10-CM | POA: Diagnosis not present

## 2018-06-21 DIAGNOSIS — M6281 Muscle weakness (generalized): Secondary | ICD-10-CM | POA: Diagnosis not present

## 2018-06-21 DIAGNOSIS — I4891 Unspecified atrial fibrillation: Secondary | ICD-10-CM | POA: Diagnosis not present

## 2018-06-21 DIAGNOSIS — E1140 Type 2 diabetes mellitus with diabetic neuropathy, unspecified: Secondary | ICD-10-CM | POA: Diagnosis not present

## 2018-06-21 NOTE — Telephone Encounter (Signed)
Noted.  LMOVM for patient to return call to Villa Herb, RN @ [phone]

## 2018-06-22 DIAGNOSIS — I509 Heart failure, unspecified: Secondary | ICD-10-CM | POA: Diagnosis not present

## 2018-06-22 DIAGNOSIS — I4891 Unspecified atrial fibrillation: Secondary | ICD-10-CM | POA: Diagnosis not present

## 2018-06-22 DIAGNOSIS — I10 Essential (primary) hypertension: Secondary | ICD-10-CM | POA: Diagnosis not present

## 2018-06-22 DIAGNOSIS — D649 Anemia, unspecified: Secondary | ICD-10-CM | POA: Diagnosis not present

## 2018-06-24 DIAGNOSIS — E1140 Type 2 diabetes mellitus with diabetic neuropathy, unspecified: Secondary | ICD-10-CM | POA: Diagnosis not present

## 2018-06-24 DIAGNOSIS — E088 Diabetes mellitus due to underlying condition with unspecified complications: Secondary | ICD-10-CM | POA: Diagnosis not present

## 2018-06-24 DIAGNOSIS — I10 Essential (primary) hypertension: Secondary | ICD-10-CM | POA: Diagnosis not present

## 2018-06-24 DIAGNOSIS — M6281 Muscle weakness (generalized): Secondary | ICD-10-CM | POA: Diagnosis not present

## 2018-06-24 DIAGNOSIS — I501 Left ventricular failure: Secondary | ICD-10-CM | POA: Diagnosis not present

## 2018-06-25 DIAGNOSIS — M5416 Radiculopathy, lumbar region: Secondary | ICD-10-CM | POA: Diagnosis not present

## 2018-06-25 DIAGNOSIS — E1140 Type 2 diabetes mellitus with diabetic neuropathy, unspecified: Secondary | ICD-10-CM | POA: Diagnosis not present

## 2018-06-25 DIAGNOSIS — R262 Difficulty in walking, not elsewhere classified: Secondary | ICD-10-CM | POA: Diagnosis not present

## 2018-06-26 DIAGNOSIS — M6281 Muscle weakness (generalized): Secondary | ICD-10-CM | POA: Diagnosis not present

## 2018-06-26 DIAGNOSIS — E088 Diabetes mellitus due to underlying condition with unspecified complications: Secondary | ICD-10-CM | POA: Diagnosis not present

## 2018-06-26 DIAGNOSIS — R262 Difficulty in walking, not elsewhere classified: Secondary | ICD-10-CM | POA: Diagnosis not present

## 2018-06-26 DIAGNOSIS — E1140 Type 2 diabetes mellitus with diabetic neuropathy, unspecified: Secondary | ICD-10-CM | POA: Diagnosis not present

## 2018-06-26 DIAGNOSIS — M5416 Radiculopathy, lumbar region: Secondary | ICD-10-CM | POA: Diagnosis not present

## 2018-06-26 DIAGNOSIS — I501 Left ventricular failure: Secondary | ICD-10-CM | POA: Diagnosis not present

## 2018-06-26 DIAGNOSIS — I10 Essential (primary) hypertension: Secondary | ICD-10-CM | POA: Diagnosis not present

## 2018-06-27 ENCOUNTER — Ambulatory Visit: Payer: Medicare Other

## 2018-06-27 ENCOUNTER — Ambulatory Visit (INDEPENDENT_AMBULATORY_CARE_PROVIDER_SITE_OTHER): Payer: Medicare Other | Admitting: *Deleted

## 2018-06-27 ENCOUNTER — Encounter: Payer: Medicare Other | Admitting: Internal Medicine

## 2018-06-27 ENCOUNTER — Telehealth: Payer: Self-pay

## 2018-06-27 ENCOUNTER — Ambulatory Visit (INDEPENDENT_AMBULATORY_CARE_PROVIDER_SITE_OTHER): Payer: Medicare Other

## 2018-06-27 DIAGNOSIS — I5022 Chronic systolic (congestive) heart failure: Secondary | ICD-10-CM

## 2018-06-27 DIAGNOSIS — I442 Atrioventricular block, complete: Secondary | ICD-10-CM

## 2018-06-27 DIAGNOSIS — Z950 Presence of cardiac pacemaker: Secondary | ICD-10-CM | POA: Diagnosis not present

## 2018-06-27 NOTE — Telephone Encounter (Signed)
LMOVM reminding pt to send remote transmission.   

## 2018-06-28 ENCOUNTER — Encounter: Payer: Self-pay | Admitting: Cardiology

## 2018-06-28 ENCOUNTER — Ambulatory Visit: Payer: Medicare Other

## 2018-06-28 ENCOUNTER — Ambulatory Visit: Payer: Medicare Other | Admitting: General Practice

## 2018-06-28 DIAGNOSIS — Z7901 Long term (current) use of anticoagulants: Secondary | ICD-10-CM

## 2018-06-28 DIAGNOSIS — I4891 Unspecified atrial fibrillation: Secondary | ICD-10-CM | POA: Diagnosis not present

## 2018-06-28 LAB — POCT INR: INR: 1.9 — AB (ref 2.0–3.0)

## 2018-06-28 NOTE — Patient Instructions (Addendum)
Pre visit review using our clinic review tool, if applicable. No additional management support is needed unless otherwise documented below in the visit note.  Take 2 tablets today (10/25) and then continue to take 1 tablet daily.  Re-check in 4 weeks.  Please follow patient instructions.

## 2018-06-28 NOTE — Progress Notes (Signed)
Remote pacemaker transmission.   

## 2018-06-28 NOTE — Progress Notes (Signed)
Agree with management.  Stacy J Burns, MD  

## 2018-06-28 NOTE — Progress Notes (Signed)
EPIC Encounter for ICM Monitoring  Patient Name: Anita Duke is a 77 y.o. female Date: 06/28/2018 Primary Care Physican: Burns, Stacy J, MD Primary Cardiologist:Varanasi/McLean Electrophysiologist: Allred Dry Weight:unknown Bi-V Pacing: 98%      Heart Failure questions reviewed, pt asymptomatic for fluid accumulation.  She said she had back surgery this month and recovering at this time.    Thoracic impedance trending close to baseline normal.   Takes Spironolactone 12.5 mg take 1 tablet daily   Labs: 06/12/2018 Creatinine 0.52, BUN 17, Potassium 4.2, Sodium 141 01/11/2018 Creatinine 0.51, BUN 15, Potassium 3.5, Sodium 142, eGFR 123-096  A complete set of results can be found in Results Review.  Recommendations: No changes.   Encouraged to call for fluid symptoms.  Follow-up plan: ICM clinic phone appointment on 07/29/2018.   Office appointment scheduled 07/23/2018 with Dr. McLean.    Copy of ICM check sent to Dr. Allred.   3 month ICM trend: 06/27/2018    1 Year ICM trend:       Laurie S Short, RN 06/28/2018 9:00 AM   

## 2018-07-02 ENCOUNTER — Ambulatory Visit: Payer: Medicare Other

## 2018-07-03 ENCOUNTER — Other Ambulatory Visit (HOSPITAL_COMMUNITY): Payer: Self-pay | Admitting: Cardiology

## 2018-07-03 ENCOUNTER — Other Ambulatory Visit: Payer: Self-pay | Admitting: Internal Medicine

## 2018-07-18 NOTE — Patient Instructions (Addendum)
  Medications reviewed and updated.  Changes include :   Start wellbutrin 150 mg daily in the morning.  Your prescription(s) have been submitted to your pharmacy. Please take as directed and contact our office if you believe you are having problem(s) with the medication(s).   Please followup in 6 months for a physical

## 2018-07-18 NOTE — Progress Notes (Signed)
Subjective:    Patient ID: Anita Duke, female    DOB: Dec 07, 1940, 77 y.o.   MRN: [id number]  HPI The patient is here for follow up.  CAD, Afib, Hypertension: She is taking her medication daily. She is compliant with a low sodium diet.  She has had a couple episodes of left-sided chest pain, but it is transient.  She has not been able to determine if it is her heart, chest wall or breast.  She does see cardiology soon.  She denies palpitations, edema, shortness of breath and regular headaches. She is exercising regularly.  She does not monitor her blood pressure at home.    Hyperlipidemia: She is taking her medication daily. She is compliant with a low fat/cholesterol diet. She is exercising regularly - walking dog. She denies myalgias.   Diabetes: She is taking her medication daily as prescribed. She is compliant with a diabetic diet. She is exercising regularly - walking dog.  She is up-to-date with an ophthalmology examination.   Mild depression: She does feel mildly depressed.  She was on Effexor in the past and she tapered herself off of it because he thought it was causing excessive diaphoresis.  She does still have some sweating, but it is not as bad.  She is a hot natured person.  She does not feel major depression, but there is some mild depression.  She has difficulty being alone-she misses her husband who died a couple of years ago.  She moved over the summer to a retirement community-independent living, but it is a big adjustment.  She is wondering if she should try a different medication.  Medications and allergies reviewed with patient and updated if appropriate.  Patient Active Problem List   Diagnosis Date Noted  . Depression 07/19/2018  . Lumbar radiculopathy, acute 06/17/2018  . Sweating abnormality 04/24/2018  . Diabetic neuropathy ([place]) 02/21/2018  . Long term (current) use of anticoagulants 06/01/2017  . 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 Excessive  sweating 07/07/2016  . Osteopenia 01/10/2016  . Encounter for therapeutic drug monitoring 10/08/2015  . Heat intolerance 10/08/2015  . CAD (coronary artery disease) 05/28/2014  . Vitamin D deficiency 02/20/2014  . Long term current use of anticoagulant therapy 11/24/2013  . Chronic systolic dysfunction of left ventricle 05/21/2013  . 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 Acquired complete AV block   . Sick sinus syndrome ([place])   . Pacemaker-Medtronic 05/20/2012  . Allergic rhinitis 04/25/2012  . Mild intermittent asthma 02/16/2011  . Ejection fraction < 50%   . Atrial fibrillation ([place])   . Mitral regurgitation   . Right ventricular dysfunction   . Mitral stenosis   . S/P mitral valve repair   . Hx of CABG   . Diabetes type II 03/02/2009  . Hyperlipidemia 07/07/2008  . Essential hypertension 07/07/2008  . Diverticulosis of large intestine 08/04/2002    Current Outpatient Medications on File Prior to Visit  Medication Sig Dispense Refill  . ACCU-CHEK SOFTCLIX LANCETS lancets USE TO CHECK BLOOD SUGARS  TWICE A DAY 200 each 2  . acetaminophen (TYLENOL) 325 MG tablet Take 650 mg by mouth every 6 (six) hours as needed (pain).     Marland Kitchen amoxicillin (AMOXIL) 500 MG capsule Take 4 tabs prior to dental appointments 30 capsule 0  . atorvastatin (LIPITOR) 40 MG tablet TAKE 1 TABLET BY MOUTH  DAILY (Patient taking differently: Take 40 mg by mouth daily. ) 90 tablet 3  .  Blood Glucose Monitoring Suppl (ACCU-CHEK AVIVA PLUS) w/Device KIT Use to check blood sugars daily Dx E11.9 1 kit 0  . carvedilol (COREG) 6.25 MG tablet TAKE 1 TABLET BY MOUTH  TWICE A DAY WITH MEALS 180 tablet 3  . cholecalciferol (VITAMIN D) 1000 UNITS tablet Take 6,000 Units by mouth daily.     . fish oil-omega-3 fatty acids 1000 MG capsule Take 1 g by mouth 2 (two) times daily.     Marland Kitchen glucose blood (ACCU-CHEK AVIVA PLUS) test strip USE 2 TIMES DAILY AS  DIRECTED 200 each 2  . glucose blood test strip  Accu-Chek Aviva Plus test strips    . JARDIANCE 25 MG TABS tablet TAKE 1 TABLET BY MOUTH  DAILY. 90 tablet 1  . loratadine (CLARITIN) 10 MG tablet Take 10 mg by mouth daily.    . potassium chloride SA (K-DUR,KLOR-CON) 20 MEQ tablet TAKE 3 TABLETS BY MOUTH  DAILY 270 tablet 3  . spironolactone (ALDACTONE) 25 MG tablet TAKE ONE-HALF TABLET BY  MOUTH DAILY 15 tablet 3  . trandolapril (MAVIK) 4 MG tablet TAKE 1 TABLET BY MOUTH TWO  TIMES DAILY FOR BLOOD  PRESSURE (Patient taking differently: Take 4 mg by mouth 2 (two) times daily. TAKE 1 TABLET BY MOUTH TWO  TIMES DAILY FOR BLOOD  PRESSURE) 180 tablet 1  . warfarin (COUMADIN) 2.5 MG tablet Take 1 tablet daily or TAKE AS DIRECTED BY  ANTICOAGULATION CLINIC 90 day 100 tablet 1   No current facility-administered medications on file prior to visit.     Past Medical History:  Diagnosis Date  . Anemia    Hemoglobin 10.4, December, 2013  . Arthritis    "not bad; little in my hands; some in my knees" (11/04/2013)  . Asthma   . CAD (coronary artery disease)    LIMA to the LAD at time of mitral valve repair / LIMA atretic,, February, 2011  . Cataracts, bilateral   . Cecal cancer ([place]) 2014   colon  . CHF (congestive heart failure) ([place])   . Colon polyp, hyperplastic   . Complication of anesthesia   . Depression   . Diverticulosis of colon   . GERD (gastroesophageal reflux disease)   . Hemorrhoids, internal   . High cholesterol   . HTN (hypertension)   . Mitral regurgitation    a. s/p repair with LAA ligation and MAZE at time of surgery  . Permanent atrial fibrillation    a. s/p MAZE b. s/p AVN ablation  . PONV (postoperative nausea and vomiting)   . Presence of permanent cardiac pacemaker   . Pulmonary HTN ([place])   . QT prolongation    Tikosyn and Effexor. QT prolonged October 13, 2011, peak is in dose reduced from 500  to -250 twice a day  . Type II diabetes mellitus ([place])     Past Surgical History:  Procedure Laterality Date  .  ABDOMINAL HYSTERECTOMY  ?2002  . AV NODE ABLATION N/A 05/28/2012   Procedure: AV NODE ABLATION;  Surgeon: Thompson Grayer, MD;  Location[facility] CATH LAB;  Service: Cardiovascular;  Laterality: N/A;  . BI-VENTRICULAR PACEMAKER UPGRADE N/A 11/04/2013   upgrade of previously implanted dual chamber pacemaker to STJ Quadra Allure CRTP by Dr Rayann Heman  . BREAST EXCISIONAL BIOPSY Left over 10 years ago   benign  . BREAST SURGERY    . CARDIOVERSION  02/08/2012   Procedure: CARDIOVERSION;  Surgeon: Carlena Bjornstad, MD;  Location: [place];  Service: Cardiovascular;  Laterality: N/A;  .  CARDIOVERSION  02/29/2012   Procedure: CARDIOVERSION;  Surgeon: Carlena Bjornstad, MD;  Location: [place];  Service: Cardiovascular;  Laterality: N/A;  . CARDIOVERSION  05/15/2012   Procedure: CARDIOVERSION;  Surgeon: Carlena Bjornstad, MD;  Location: [place];  Service: Cardiovascular;  Laterality: N/A;  . CATARACT EXTRACTION, BILATERAL  2017  . CHOLECYSTECTOMY  1995  . CORONARY ARTERY BYPASS GRAFT  2001   CABG X1 "at time of mitral valve repair" (12/04/2012  . DIAGNOSTIC LAPAROSCOPIC LIVER BIOPSY Left 12/03/2012   Procedure: DIAGNOSTIC LAPAROSCOPIC LIVER BIOPSY;  Surgeon: Stark Klein, MD;  Location: [facility];  Service: General;  Laterality: Left;  . EYE SURGERY     bilateral cataract surgery  . LAPAROSCOPIC RIGHT HEMI COLECTOMY  12/03/2012   Procedure: LAPAROSCOPIC RIGHT HEMI COLECTOMY;  Surgeon: Stark Klein, MD;  Location: [place];  Service: General;;  . LUMBAR LAMINECTOMY/ DECOMPRESSION WITH MET-RX Left 06/17/2018   Procedure: left Lumbar three-fourextraforaminal Microdiscectomy with Met-Rx;  Surgeon: Kristeen Miss, MD;  Location: [facility];  Service: Neurosurgery;  Laterality: Left;  Marland Kitchen MAZE  2001   w/ MVR & CABG  . MITRAL VALVE REPAIR  2001   "anterior and posterior leaflets" (12/04/2012)  . 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 TONSILLECTOMY AND ADENOIDECTOMY  ~ 1950  .  UMBILICAL HERNIA REPAIR N/A 12/03/2012   Procedure: HERNIA REPAIR UMBILICAL ADULT;  Surgeon: Stark Klein, MD;  Location: [facility];  Service: General;  Laterality: N/A;    Social History   Socioeconomic History  . Marital status: Widowed    Spouse name: Not on file  . Number of children: 0  . Years of education: Not on file  . Highest education level: Not on file  Occupational History  . 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 Financial resource strain: Not on file  . Food insecurity:    Worry: Not on file    Inability: Not on file  . Transportation needs:    Medical: Not on file    Non-medical: Not on file  Tobacco Use  . Smoking status: Never Smoker  . Smokeless tobacco: Never Used  Substance and Sexual Activity  . Alcohol use: No  . Drug use: No  . Sexual activity: Never    Birth control/protection: Surgical  Lifestyle  . Physical activity:    Days per week: Not on file    Minutes per session: Not on file  . Stress: Not on file  Relationships  . Social connections:    Talks on phone: Not on file    Gets together: Not on file    Attends religious service: Not on file    Active member of club or organization: Not on file    Attends meetings of clubs or organizations: Not on file    Relationship status: Not on file  Other Topics Concern  . Not on file  Social History Narrative       Family History  Problem Relation Age of Onset  . Heart disease Father   . Diabetes Father   . Heart disease Sister        x 2  . Diabetes Mother   . Kidney disease Mother   . Thyroid disease Mother   . Pancreatic cancer Maternal Grandfather   . Diabetes Paternal Grandfather   . Diabetes  Paternal Grandmother   . Heart disease Paternal Uncle        x 6  . Prostate cancer Paternal Uncle   . Colon cancer Neg Hx   . Colon polyps Neg Hx   . Adrenal disorder Neg Hx     Review of Systems    Constitutional: Negative for chills and fever.  Respiratory: Negative for cough, shortness of breath and wheezing.   Cardiovascular: Positive for chest pain (a couple of episodes - ? breast or chest). Negative for palpitations and leg swelling.  Neurological: Positive for headaches (Occasional). Negative for light-headedness.       Objective:   Vitals:   07/19/18 0830  BP: (!) 148/72  Pulse: 77  Resp: 16  Temp: 97.7 F (36.5 C)  SpO2: 98%   BP Readings from Last 3 Encounters:  07/19/18 (!) 148/72  06/18/18 (!) 142/69  06/12/18 (!) 158/70   Wt Readings from Last 3 Encounters:  07/19/18 144 lb (65.3 kg)  06/17/18 150 lb 12.8 oz (68.4 kg)  06/12/18 150 lb 12.8 oz (68.4 kg)   Body mass index is 26.34 kg/m.   Physical Exam    Constitutional: Appears well-developed and well-nourished. No distress.  HENT:  Head: Normocephalic and atraumatic.  Neck: Neck supple. No tracheal deviation present. No thyromegaly present.  No cervical lymphadenopathy Cardiovascular: Normal rate, irregular rhythm and normal heart sounds.   2/6 systolic murmur heard. No carotid bruit .  No edema Pulmonary/Chest: Effort normal and breath sounds normal. No respiratory distress. No has no wheezes. No rales.  Skin: Skin is warm and dry. Not diaphoretic.  Psychiatric: Normal mood and affect. Behavior is normal.      Assessment & Plan:    See Problem List for Assessment and Plan of chronic medical problems.

## 2018-07-19 ENCOUNTER — Ambulatory Visit: Payer: Medicare Other | Admitting: Internal Medicine

## 2018-07-19 ENCOUNTER — Encounter: Payer: Self-pay | Admitting: Internal Medicine

## 2018-07-19 VITALS — BP 148/72 | HR 77 | Temp 97.7°F | Resp 16 | Ht 62.0 in | Wt 144.0 lb

## 2018-07-19 DIAGNOSIS — I10 Essential (primary) hypertension: Secondary | ICD-10-CM

## 2018-07-19 DIAGNOSIS — I2510 Atherosclerotic heart disease of native coronary artery without angina pectoris: Secondary | ICD-10-CM

## 2018-07-19 DIAGNOSIS — F329 Major depressive disorder, single episode, unspecified: Secondary | ICD-10-CM | POA: Insufficient documentation

## 2018-07-19 DIAGNOSIS — F32A Depression, unspecified: Secondary | ICD-10-CM | POA: Insufficient documentation

## 2018-07-19 DIAGNOSIS — I4891 Unspecified atrial fibrillation: Secondary | ICD-10-CM

## 2018-07-19 DIAGNOSIS — E782 Mixed hyperlipidemia: Secondary | ICD-10-CM

## 2018-07-19 DIAGNOSIS — E1159 Type 2 diabetes mellitus with other circulatory complications: Secondary | ICD-10-CM

## 2018-07-19 DIAGNOSIS — F3289 Other specified depressive episodes: Secondary | ICD-10-CM

## 2018-07-19 HISTORY — DX: Major depressive disorder, single episode, unspecified: F32.9

## 2018-07-19 MED ORDER — BUPROPION HCL ER (XL) 150 MG PO TB24: 150.0000 mg | ORAL_TABLET | Freq: Every day | ORAL | 5 refills | Status: DC

## 2018-07-19 MED FILL — buPROPion HCL ER (XL) 150 M: 150 | 30 days supply | Qty: 30 | Fill #0

## 2018-07-19 NOTE — Assessment & Plan Note (Signed)
She is experiencing mild depression-mostly related to being alone and she just moved into an independent living facility, which is a big adjustment Tapered off Effexor secondary to possible increased sweating that was excessive in nature We will try Wellbutrin 150 mg daily

## 2018-07-19 NOTE — Assessment & Plan Note (Signed)
Lab Results  Component Value Date   HGBA1C 6.9 (H) 06/12/2018   Last A1c 6.9%-sugars controlled Continue current medications Follow-up in 6 months

## 2018-07-19 NOTE — Assessment & Plan Note (Signed)
Blood pressure slightly elevated here today Does not monitor blood pressure at home She states she just took her blood pressure medications before coming here Has cardiology appointment next week and he can be rechecked that No change in medication

## 2018-07-19 NOTE — Assessment & Plan Note (Signed)
Rate controlled, asymptomatic Following with cardiology Continue current medications

## 2018-07-19 NOTE — Assessment & Plan Note (Signed)
Continue statin Not eating a very healthy diet-encouraged heart healthy diet Continue regular exercise-increase if possible

## 2018-07-19 NOTE — Assessment & Plan Note (Signed)
Has appointment with cardiology next week Has had a couple of episodes of left-sided chest pain, but sounds atypical in nature-she will discuss with cardiology Continue current medications

## 2018-07-20 LAB — CUP PACEART REMOTE DEVICE CHECK
Battery Remaining Percentage: 95.5 %
Battery Voltage: 2.96 V
Date Time Interrogation Session: 20191024201123
Implantable Lead Implant Date: 20130913
Implantable Lead Implant Date: 20150303
Implantable Lead Location: 753859
Implantable Lead Model: 5092
Lead Channel Impedance Value: 440 Ohm
Lead Channel Impedance Value: 440 Ohm
Lead Channel Pacing Threshold Amplitude: 0.625 V
Lead Channel Pacing Threshold Pulse Width: 0.4 ms
Lead Channel Sensing Intrinsic Amplitude: 6.5 mV
Lead Channel Setting Pacing Amplitude: 2 V
Lead Channel Setting Pacing Amplitude: 2 V
Lead Channel Setting Pacing Pulse Width: 0.4 ms
Lead Channel Setting Pacing Pulse Width: 0.6 ms
MDC IDC LEAD IMPLANT DT: 20130913
MDC IDC LEAD LOCATION: 753858
MDC IDC LEAD LOCATION: 753860
MDC IDC MSMT BATTERY REMAINING LONGEVITY: 92 mo
MDC IDC MSMT LEADCHNL LV PACING THRESHOLD AMPLITUDE: 0.875 V
MDC IDC MSMT LEADCHNL LV PACING THRESHOLD PULSEWIDTH: 0.6 ms
MDC IDC PG IMPLANT DT: 20150303
MDC IDC PG SERIAL: 7548835
MDC IDC SET LEADCHNL RV SENSING SENSITIVITY: 3 mV

## 2018-07-23 ENCOUNTER — Encounter (HOSPITAL_COMMUNITY): Payer: Self-pay | Admitting: Cardiology

## 2018-07-23 ENCOUNTER — Ambulatory Visit (HOSPITAL_COMMUNITY)
Admission: RE | Admit: 2018-07-23 | Discharge: 2018-07-23 | Disposition: A | Discharge status: Home / Self Care | Payer: Medicare Other | Source: Ambulatory Visit | Attending: Cardiology | Admitting: Cardiology

## 2018-07-23 VITALS — BP 149/70 | HR 72 | Wt 143.8 lb

## 2018-07-23 DIAGNOSIS — Z8249 Family history of ischemic heart disease and other diseases of the circulatory system: Secondary | ICD-10-CM | POA: Diagnosis not present

## 2018-07-23 DIAGNOSIS — I442 Atrioventricular block, complete: Secondary | ICD-10-CM | POA: Diagnosis not present

## 2018-07-23 DIAGNOSIS — I4821 Permanent atrial fibrillation: Secondary | ICD-10-CM | POA: Diagnosis not present

## 2018-07-23 DIAGNOSIS — Z8349 Family history of other endocrine, nutritional and metabolic diseases: Secondary | ICD-10-CM | POA: Diagnosis not present

## 2018-07-23 DIAGNOSIS — E119 Type 2 diabetes mellitus without complications: Secondary | ICD-10-CM | POA: Diagnosis not present

## 2018-07-23 DIAGNOSIS — Z79899 Other long term (current) drug therapy: Secondary | ICD-10-CM | POA: Diagnosis not present

## 2018-07-23 DIAGNOSIS — I5022 Chronic systolic (congestive) heart failure: Secondary | ICD-10-CM | POA: Insufficient documentation

## 2018-07-23 DIAGNOSIS — Z885 Allergy status to narcotic agent status: Secondary | ICD-10-CM | POA: Insufficient documentation

## 2018-07-23 DIAGNOSIS — Z7901 Long term (current) use of anticoagulants: Secondary | ICD-10-CM | POA: Insufficient documentation

## 2018-07-23 DIAGNOSIS — I272 Pulmonary hypertension, unspecified: Secondary | ICD-10-CM | POA: Diagnosis not present

## 2018-07-23 DIAGNOSIS — I4819 Other persistent atrial fibrillation: Secondary | ICD-10-CM | POA: Diagnosis not present

## 2018-07-23 DIAGNOSIS — Z833 Family history of diabetes mellitus: Secondary | ICD-10-CM | POA: Insufficient documentation

## 2018-07-23 DIAGNOSIS — Z950 Presence of cardiac pacemaker: Secondary | ICD-10-CM | POA: Insufficient documentation

## 2018-07-23 DIAGNOSIS — F329 Major depressive disorder, single episode, unspecified: Secondary | ICD-10-CM | POA: Insufficient documentation

## 2018-07-23 DIAGNOSIS — Z887 Allergy status to serum and vaccine status: Secondary | ICD-10-CM | POA: Diagnosis not present

## 2018-07-23 DIAGNOSIS — I519 Heart disease, unspecified: Secondary | ICD-10-CM

## 2018-07-23 DIAGNOSIS — Z7984 Long term (current) use of oral hypoglycemic drugs: Secondary | ICD-10-CM | POA: Diagnosis not present

## 2018-07-23 DIAGNOSIS — Z85038 Personal history of other malignant neoplasm of large intestine: Secondary | ICD-10-CM | POA: Insufficient documentation

## 2018-07-23 DIAGNOSIS — Z841 Family history of disorders of kidney and ureter: Secondary | ICD-10-CM | POA: Diagnosis not present

## 2018-07-23 DIAGNOSIS — Z888 Allergy status to other drugs, medicaments and biological substances status: Secondary | ICD-10-CM | POA: Insufficient documentation

## 2018-07-23 DIAGNOSIS — J449 Chronic obstructive pulmonary disease, unspecified: Secondary | ICD-10-CM | POA: Diagnosis not present

## 2018-07-23 DIAGNOSIS — I2510 Atherosclerotic heart disease of native coronary artery without angina pectoris: Secondary | ICD-10-CM | POA: Diagnosis not present

## 2018-07-23 LAB — BASIC METABOLIC PANEL
ANION GAP: 7 (ref 5–15)
BUN: 13 mg/dL (ref 8–23)
CALCIUM: 9.4 mg/dL (ref 8.9–10.3)
CO2: 25 mmol/L (ref 22–32)
Chloride: 108 mmol/L (ref 98–111)
Creatinine, Ser: 0.61 mg/dL (ref 0.44–1.00)
GFR calc Af Amer: >60 mL/min (ref >60)
GFR calc non Af Amer: >60 mL/min (ref >60)
GLUCOSE: 137 mg/dL — AB (ref 70–99)
POTASSIUM: 3.9 mmol/L (ref 3.5–5.1)
SODIUM: 140 mmol/L (ref 135–145)

## 2018-07-23 MED ORDER — SPIRONOLACTONE 25 MG PO TABS: 25.0000 mg | ORAL_TABLET | Freq: Every day | ORAL | 3 refills | Status: DC

## 2018-07-23 NOTE — Patient Instructions (Signed)
INCREASE Spironolactone to 25mg  (1 tab) daily  STOP Potassium  Labs today We will only contact you if something comes back abnormal or we need to make some changes. Otherwise no news is good news!  Repeat labs in 7 days.   Your physician recommends that you schedule a follow-up appointment in: 6 months with Dr Aundra Dubin and an Echo. CALL IN MARCH TO SCHEDULE APPOINTMENTS  Your physician has requested that you have an echocardiogram. Echocardiography is a painless test that uses sound waves to create images of your heart. It provides your doctor with information about the size and shape of your heart and how well your heart's chambers and valves are working. This procedure takes approximately one hour. There are no restrictions for this procedure.

## 2018-07-23 NOTE — Progress Notes (Signed)
Advanced Heart Failure Clinic Note    Primary Care: Dr. Stacy Burns Primary Cardiologist: Dr. McLean EP: Dr. Allred   HPI: Anita Duke is a 77 year old female with a past medical history of pulmonary HTN, mitral regurgitation s/p mitral valve repair, MAZE procedure at the [facility] in 2001, at that time she also had a LIMA to LAD graft placed. Also with history of permanent Afib on warfarin s/p AVN ablation. At one time she was on Tikosyn which was stopped for QT prolongation.  She has chronic systolic CHF (EF 40% in 12/2014).  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 Last cath in 2014 with atretic LIMA to LAD but nonobstructive disease in LAD.   Echo in 10/18 showed EF improved to 55-60% with mild to moderate RV dysfunction.   Today she returns for HF follow up. Since the last visit she had back surgery. Says she feels much better.  Overall feeling fine. Denies SOB/PND/Orthopnea. Appetite ok. No fever or chills. No bleeding problems. Weight at home 143-145  pounds. Taking all medications. [facility]   Labs (5/18): K 4.7, creatinine 0.62, LDL 71, HDL 61 Labs (10/18): K 3.5, creatinine 0.59, TSH normal Lasb (01/11/2018): K 3.5 Creatinine 0.51 Labs (06/12/2018): K 4.2 Creatinine 0/52   St Jude device interrogation (personally reviewed): 98% BP and impedance down.    ROS: All systems reviewed and negative except as per HPI.  PMH: 1. Type II diabetes. 2. COPD 3. Depression 4. H/o colon cancer 5. CAD: LIMA-LAD in 4/01 with MV repair.   - LHC (8/14): LIMA-LAD atretic, 50-60% mid LAD stenosis.  6. Mitral regurgitation: S/p MV repair at the [facility] in 4/01.  She also had LAA ligation and MAZE.  7. 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   - St Jude CRT-P device.  - Echo (4/16): EF 40%, mild LV dilation, normal RV size with mild to moderately decreased systolic function, s/p mitral valve repair with mild MR, no significant stenosis.  -  Echo (10/18): EF 55-60%, severe LAE, RV with mild to moderate systolic dysfunction, mild-moderate TR, PASP 35 mmHg.  8. Permanent atrial fibrillation: S/p MAZE in 4/01.  S/p AV nodal ablation in 9/13.     Current Outpatient Medications  Medication Sig Dispense Refill  . ACCU-CHEK SOFTCLIX LANCETS lancets USE TO CHECK BLOOD SUGARS  TWICE A DAY 200 each 2  . acetaminophen (TYLENOL) 325 MG tablet Take 650 mg by mouth every 6 (six) hours as needed (pain).     . amoxicillin (AMOXIL) 500 MG capsule Take 4 tabs prior to dental appointments 30 capsule 0  . atorvastatin (LIPITOR) 40 MG tablet TAKE 1 TABLET BY MOUTH  DAILY (Patient taking differently: Take 40 mg by mouth daily. ) 90 tablet 3  . Blood Glucose Monitoring Suppl (ACCU-CHEK AVIVA PLUS) w/Device KIT Use to check blood sugars daily Dx E11.9 1 kit 0  . buPROPion (WELLBUTRIN XL) 150 MG 24 hr tablet Take 1 tablet (150 mg total) by mouth daily. 30 tablet 5  . carvedilol (COREG) 6.25 MG tablet TAKE 1 TABLET BY MOUTH  TWICE A DAY WITH MEALS 180 tablet 3  . cholecalciferol (VITAMIN D) 1000 UNITS tablet Take 6,000 Units by mouth daily.     . fish oil-omega-3 fatty acids 1000 MG capsule Take 1 g by mouth 2 (two) times daily.     . glucose blood (ACCU-CHEK AVIVA PLUS) test strip USE 2 TIMES DAILY AS  DIRECTED 200   each 2  . JARDIANCE 25 MG TABS tablet TAKE 1 TABLET BY MOUTH  DAILY. 90 tablet 1  . loratadine (CLARITIN) 10 MG tablet Take 10 mg by mouth daily.    . potassium chloride SA (K-DUR,KLOR-CON) 20 MEQ tablet TAKE 3 TABLETS BY MOUTH  DAILY 270 tablet 3  . spironolactone (ALDACTONE) 25 MG tablet TAKE ONE-HALF TABLET BY  MOUTH DAILY 15 tablet 3  . trandolapril (MAVIK) 4 MG tablet TAKE 1 TABLET BY MOUTH TWO  TIMES DAILY FOR BLOOD  PRESSURE (Patient taking differently: Take 4 mg by mouth 2 (two) times daily. TAKE 1 TABLET BY MOUTH TWO  TIMES DAILY FOR BLOOD  PRESSURE) 180 tablet 1  . warfarin (COUMADIN) 2.5 MG tablet Take 1 tablet daily or TAKE AS  DIRECTED BY  ANTICOAGULATION CLINIC 90 day 100 tablet 1   No current facility-administered medications for this encounter.     Allergies  Allergen Reactions  . Demerol [Meperidine] Nausea And Vomiting  . Morphine Nausea And Vomiting  . Januvia [Sitagliptin] Other (See Comments)    Headaches, did not feel well  . Metformin And Related Diarrhea  . Tetanus Toxoid Rash and Other (See Comments)    "years ago"      Social History   Socioeconomic History  . Marital status: Widowed    Spouse name: Not on file  . Number of children: 0  . Years of education: Not on file  . Highest education level: Not on file  Occupational History  . 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 Financial resource strain: Not on file  . Food insecurity:    Worry: Not on file    Inability: Not on file  . Transportation needs:    Medical: Not on file    Non-medical: Not on file  Tobacco Use  . Smoking status: Never Smoker  . Smokeless tobacco: Never Used  Substance and Sexual Activity  . Alcohol use: No  . Drug use: No  . Sexual activity: Never    Birth control/protection: Surgical  Lifestyle  . Physical activity:    Days per week: Not on file    Minutes per session: Not on file  . Stress: Not on file  Relationships  . Social connections:    Talks on phone: Not on file    Gets together: Not on file    Attends religious service: Not on file    Active member of club or organization: Not on file    Attends meetings of clubs or organizations: Not on file    Relationship status: Not on file  . Intimate partner violence:    Fear of current or ex partner: Not on file    Emotionally abused: Not on file    Physically abused: Not on file    Forced sexual activity: Not on file  Other Topics Concern  . Not on file  Social History Narrative         Family History  Problem Relation Age of Onset  . Heart  disease Father   . Diabetes Father   . Heart disease Sister        x 2  . Diabetes Mother   . Kidney disease Mother   . Thyroid disease Mother   . Pancreatic cancer Maternal Grandfather   . Diabetes Paternal Grandfather   . Diabetes Paternal Grandmother   . Heart disease  Paternal Uncle        x 6  . Prostate cancer Paternal Uncle   . Colon cancer Neg Hx   . Colon polyps Neg Hx   . Adrenal disorder Neg Hx     Vitals:   07/23/18 0844  BP: (!) 149/70  Pulse: 72  SpO2: 97%  Weight: 65.2 kg (143 lb 12.8 oz)   Wt Readings from Last 3 Encounters:  07/23/18 65.2 kg (143 lb 12.8 oz)  07/19/18 65.3 kg (144 lb)  06/17/18 68.4 kg (150 lb 12.8 oz)     PHYSICAL EXAM: General:  Well appearing. No resp difficulty HEENT: normal Neck: supple. no JVD. Carotids 2+ bilat; no bruits. No lymphadenopathy or thryomegaly appreciated. Cor: PMI nondisplaced. Regular rate & rhythm. No rubs, gallops or murmurs. Lungs: clear Abdomen: soft, nontender, nondistended. No hepatosplenomegaly. No bruits or masses. Good bowel sounds. Extremities: no cyanosis, clubbing, rash, edema Neuro: alert & orientedx3, cranial nerves grossly intact. moves all 4 extremities w/o difficulty. Affect pleasant  ASSESSMENT & PLAN: 1. Chronic systolic CHF: EF 40% in 9735. NICM most likely.  She has LIMA-LAD that is atretic, but no flow limiting disease in the LAD.  Cardiomyopathy may be due to RV pacing after AV nodal ablation in 9/13. She now has St Jude CRT-P device and EF was up to 55-60% on 10/18 echo.  Repeat ECHO  Corvue  Ok. NYHA II. Volume status stable.  - Continue Coreg 6.25 mg BID.  - Continue trandolapril 4 mg bid.  - Increase spiro to 25 mg daily. Check BMET today and in 7 days. ironolactone 12.5 mg daily.   - Check ECHO  2. CAD: LIMA-LAD at time of MV surgery in 4/01, but LIMA-LAD atretic on 8/14 cath.  However, at that time her LAD had moderate stenosis that did not appear flow-limiting (50-60%). - No s/s  ischemia  - Continue atorvastatin 40 mg daily. Good LDL in 5/18. - No ASA given stable CAD with use of warfarin 3. Complete heart block: She had an AV nodal ablation.  Now s/p CRT-P.   4. Permanent atrial fibrillation: S/P AV nodal ablation and BiV pacing. - Continue warfarin for anticoagulation.  5. DM: Continue Jardiance.  6. S/p MV repair: Stable mitral valve repair on 10/18 echo.     Repeat ECHO. Check BMET today and in 7 days. Follow up in 6 months.   Anita Grinder, NP 07/23/18

## 2018-07-26 ENCOUNTER — Ambulatory Visit: Payer: Medicare Other | Admitting: General Practice

## 2018-07-26 DIAGNOSIS — Z7901 Long term (current) use of anticoagulants: Secondary | ICD-10-CM

## 2018-07-26 DIAGNOSIS — I4891 Unspecified atrial fibrillation: Secondary | ICD-10-CM

## 2018-07-26 LAB — POCT INR: INR: 2.8 (ref 2.0–3.0)

## 2018-07-26 NOTE — Patient Instructions (Addendum)
Pre visit review using our clinic review tool, if applicable. No additional management support is needed unless otherwise documented below in the visit note.  Continue to take 1 tablet daily.  Re-check in 4 weeks.  

## 2018-07-27 NOTE — Progress Notes (Signed)
Agree with management.  Chelsi Warr J Denni France, MD  

## 2018-07-29 ENCOUNTER — Ambulatory Visit (INDEPENDENT_AMBULATORY_CARE_PROVIDER_SITE_OTHER): Payer: Medicare Other

## 2018-07-29 DIAGNOSIS — Z950 Presence of cardiac pacemaker: Secondary | ICD-10-CM

## 2018-07-29 DIAGNOSIS — I5022 Chronic systolic (congestive) heart failure: Secondary | ICD-10-CM

## 2018-07-30 ENCOUNTER — Ambulatory Visit (HOSPITAL_COMMUNITY)
Admission: RE | Admit: 2018-07-30 | Discharge: 2018-07-30 | Disposition: A | Discharge status: Home / Self Care | Payer: Medicare Other | Source: Ambulatory Visit | Attending: Internal Medicine | Admitting: Internal Medicine

## 2018-07-30 DIAGNOSIS — I519 Heart disease, unspecified: Secondary | ICD-10-CM | POA: Insufficient documentation

## 2018-07-30 LAB — BASIC METABOLIC PANEL
Anion gap: 7 (ref 5–15)
BUN: 9 mg/dL (ref 8–23)
CALCIUM: 9.6 mg/dL (ref 8.9–10.3)
CO2: 28 mmol/L (ref 22–32)
Chloride: 106 mmol/L (ref 98–111)
Creatinine, Ser: 0.7 mg/dL (ref 0.44–1.00)
GFR calc Af Amer: >60 mL/min (ref >60)
GFR calc non Af Amer: >60 mL/min (ref >60)
GLUCOSE: 157 mg/dL — AB (ref 70–99)
Potassium: 3.8 mmol/L (ref 3.5–5.1)
Sodium: 141 mmol/L (ref 135–145)

## 2018-07-30 NOTE — Progress Notes (Signed)
EPIC Encounter for ICM Monitoring  Patient Name: Anita Duke is a 77 y.o. female Date: 07/30/2018 Primary Care Physican: Binnie Rail, MD Primary Cardiologist:Varanasi/McLean Electrophysiologist: Allred Dry Weight:unknown Bi-V Pacing: 98%                                            Heart Failure questions reviewed, pt asymptomatic for fluid accumulation.   She said she feels fine.    Thoracic impedance trending close to baseline normal.   Takes Spironolactone 25 mg take 1 tablet daily   Labs: 07/29/2018 Creatinine 0.70, BUN 9,   Potassium 3.8, Sodium 141, eGFR >60 07/23/2018 Creatinine 0.61, BUN 13, Potassium 3.9, Sodium 140, eGFR >60 06/12/2018 Creatinine 0.52, BUN 17, Potassium 4.2, Sodium 141 01/11/2018 Creatinine 0.51, BUN 15, Potassium 3.5, Sodium 142, eGFR 123-096  A complete set of results can be found in Results Review.  Recommendations: No changes. Advised to limit salt intake.   Encouraged to call for fluid symptoms.  Follow-up plan: ICM clinic phone appointment on 08/29/2018.      Copy of ICM check sent to Dr. Rayann Heman.   3 month ICM trend: 07/30/2018    1 Year ICM trend:       [name], RN 07/30/2018 4:00 PM

## 2018-08-12 MED FILL — buPROPion HCL ER (XL) 150 M: 150 | 30 days supply | Qty: 30 | Fill #1

## 2018-08-23 ENCOUNTER — Ambulatory Visit: Payer: Medicare Other

## 2018-08-29 ENCOUNTER — Ambulatory Visit (INDEPENDENT_AMBULATORY_CARE_PROVIDER_SITE_OTHER): Payer: Medicare Other

## 2018-08-29 DIAGNOSIS — I5022 Chronic systolic (congestive) heart failure: Secondary | ICD-10-CM

## 2018-08-29 DIAGNOSIS — Z950 Presence of cardiac pacemaker: Secondary | ICD-10-CM

## 2018-08-29 NOTE — Progress Notes (Signed)
EPIC Encounter for ICM Monitoring  Patient Name: Anita Duke is a 77 y.o. female Date: 08/29/2018 Primary Care Physican: Binnie Rail, MD Primary Cardiologist:Varanasi/McLean Electrophysiologist: Allred Dry Weight:unknown Bi-V Pacing: 98%  Transmission reviewed.  Thoracic impedancenormal.  Takes Spironolactone 25 mg take 1 tablet daily  Labs: 07/29/2018 Creatinine 0.70, BUN 9,   Potassium 3.8, Sodium 141, eGFR >60 07/23/2018 Creatinine 0.61, BUN 13, Potassium 3.9, Sodium 140, eGFR >60 06/12/2018 Creatinine0.52, BUN17, Potassium4.2, Sodium141 01/11/2018 Creatinine0.51, BUN15, Potassium3.5, Sodium142, [id number] A complete set of results can be found in Results Review.  Recommendations:None  Follow-up plan: ICM clinic phone appointment on1/27/2020.   Copy of ICM check sent to Dr.Allred.   3 month ICM trend: 08/29/2018    1 Year ICM trend:       [name], RN 08/29/2018 2:52 PM

## 2018-08-30 ENCOUNTER — Ambulatory Visit: Payer: Medicare Other | Admitting: General Practice

## 2018-08-30 DIAGNOSIS — I4891 Unspecified atrial fibrillation: Secondary | ICD-10-CM

## 2018-08-30 DIAGNOSIS — Z7901 Long term (current) use of anticoagulants: Secondary | ICD-10-CM

## 2018-08-30 LAB — POCT INR: INR: 2.3 (ref 2.0–3.0)

## 2018-08-30 NOTE — Progress Notes (Signed)
Medical treatment/procedure(s) were performed by non-physician practitioner and as supervising physician I was immediately available for consultation/collaboration. I agree with above. Exie Chrismer A [place], MD  

## 2018-08-30 NOTE — Patient Instructions (Addendum)
Pre visit review using our clinic review tool, if applicable. No additional management support is needed unless otherwise documented below in the visit note.  Continue to take 1 tablet daily.  Re-check in 6 weeks.  

## 2018-09-05 ENCOUNTER — Other Ambulatory Visit (HOSPITAL_COMMUNITY): Payer: Self-pay

## 2018-09-05 MED ORDER — SPIRONOLACTONE 25 MG PO TABS: 25.0000 mg | ORAL_TABLET | Freq: Every day | ORAL | 3 refills | Status: DC

## 2018-09-05 MED FILL — buPROPion HCL ER (XL) 150 M: 150 | 30 days supply | Qty: 30 | Fill #2

## 2018-09-30 ENCOUNTER — Ambulatory Visit (INDEPENDENT_AMBULATORY_CARE_PROVIDER_SITE_OTHER): Payer: Medicare Other

## 2018-09-30 DIAGNOSIS — I442 Atrioventricular block, complete: Secondary | ICD-10-CM

## 2018-09-30 MED FILL — buPROPion HCL ER (XL) 150 M: 150 | 30 days supply | Qty: 30 | Fill #3

## 2018-10-01 ENCOUNTER — Ambulatory Visit (INDEPENDENT_AMBULATORY_CARE_PROVIDER_SITE_OTHER): Payer: Medicare Other

## 2018-10-01 DIAGNOSIS — Z950 Presence of cardiac pacemaker: Secondary | ICD-10-CM | POA: Diagnosis not present

## 2018-10-01 DIAGNOSIS — I5022 Chronic systolic (congestive) heart failure: Secondary | ICD-10-CM

## 2018-10-01 NOTE — Progress Notes (Signed)
Remote pacemaker transmission.   

## 2018-10-03 ENCOUNTER — Telehealth: Payer: Self-pay

## 2018-10-03 LAB — CUP PACEART REMOTE DEVICE CHECK
Battery Remaining Percentage: 95.5 %
Battery Voltage: 2.96 V
Date Time Interrogation Session: 20200127102407
Implantable Lead Implant Date: 20130913
Implantable Lead Implant Date: 20150303
Implantable Lead Model: 5092
Implantable Pulse Generator Implant Date: 20150303
Lead Channel Impedance Value: 410 Ohm
Lead Channel Impedance Value: 430 Ohm
Lead Channel Pacing Threshold Amplitude: 0.75 V
Lead Channel Pacing Threshold Pulse Width: 0.6 ms
Lead Channel Sensing Intrinsic Amplitude: 7.5 mV
Lead Channel Setting Pacing Amplitude: 2 V
Lead Channel Setting Pacing Pulse Width: 0.4 ms
Lead Channel Setting Pacing Pulse Width: 0.6 ms
MDC IDC LEAD IMPLANT DT: 20130913
MDC IDC LEAD LOCATION: 753858
MDC IDC LEAD LOCATION: 753859
MDC IDC LEAD LOCATION: 753860
MDC IDC MSMT BATTERY REMAINING LONGEVITY: 88 mo
MDC IDC MSMT LEADCHNL LV PACING THRESHOLD AMPLITUDE: 1.125 V
MDC IDC MSMT LEADCHNL RV PACING THRESHOLD PULSEWIDTH: 0.4 ms
MDC IDC SET LEADCHNL LV PACING AMPLITUDE: 2.125
MDC IDC SET LEADCHNL RV SENSING SENSITIVITY: 3 mV
Pulse Gen Model: 3242
Pulse Gen Serial Number: 7548835

## 2018-10-03 NOTE — Progress Notes (Signed)
EPIC Encounter for ICM Monitoring  Patient Name: Anita Duke is a 78 y.o. female Date: 10/03/2018 Primary Care Physican: Binnie Rail, MD Primary Cardiologist:Varanasi/McLean Electrophysiologist: Allred Dry Weight:unknown Bi-V Pacing: 98%  Attempted call to patient and unable to reach.  Left detailed message per DPR regarding transmission. Transmission reviewed.   Thoracic impedancenormal.  Takes Spironolactone60m take 1 tablet daily  Labs: 07/29/2018 Creatinine 0.70, BUN 9, Potassium 3.8, Sodium 141, eGFR >60 07/23/2018 Creatinine 0.61, BUN 13, Potassium 3.9, Sodium 140, eGFR >60 06/12/2018 Creatinine0.52, BUN17, Potassium4.2, Sodium141 01/11/2018 Creatinine0.51, BUN15, Potassium3.5, Sodium142, [id number] complete set of results can be found in Results Review.  Recommendations:Left voice mail with ICM number and encouraged to call if experiencing any fluid symptoms.  Follow-up plan: ICM clinic phone appointment on3/10/2018.   Copy of ICM check sent to Dr.Allred.  3 month ICM trend: 09/30/2018    1 Year ICM trend:       LRosalene Billings RN 10/03/2018 5:25 PM

## 2018-10-03 NOTE — Telephone Encounter (Signed)
Remote ICM transmission received.  Attempted call to patient regarding ICM remote transmission and left detailed message, per DPR, with next ICM remote transmission date of 11/04/2018.  Advised to return call for any fluid symptoms or questions.    

## 2018-10-10 ENCOUNTER — Other Ambulatory Visit: Payer: Self-pay | Admitting: Internal Medicine

## 2018-10-11 ENCOUNTER — Ambulatory Visit: Payer: Medicare Other | Admitting: General Practice

## 2018-10-11 ENCOUNTER — Ambulatory Visit: Payer: Medicare Other

## 2018-10-11 DIAGNOSIS — Z7901 Long term (current) use of anticoagulants: Secondary | ICD-10-CM

## 2018-10-11 DIAGNOSIS — I4891 Unspecified atrial fibrillation: Secondary | ICD-10-CM

## 2018-10-11 LAB — POCT INR: INR: 2.2 (ref 2.0–3.0)

## 2018-10-11 NOTE — Progress Notes (Signed)
Agree with management.  Anita Duke Anita Koreena Joost, MD  

## 2018-10-11 NOTE — Patient Instructions (Addendum)
Pre visit review using our clinic review tool, if applicable. No additional management support is needed unless otherwise documented below in the visit note.  Continue to take 1 tablet daily.  Re-check in 6 weeks.  

## 2018-10-28 MED FILL — buPROPion HCL ER (XL) 150 M: 150 | 30 days supply | Qty: 30 | Fill #4 | Status: TO

## 2018-10-30 ENCOUNTER — Other Ambulatory Visit: Payer: Self-pay | Admitting: Internal Medicine

## 2018-10-30 DIAGNOSIS — Z1231 Encounter for screening mammogram for malignant neoplasm of breast: Secondary | ICD-10-CM

## 2018-11-04 ENCOUNTER — Ambulatory Visit (INDEPENDENT_AMBULATORY_CARE_PROVIDER_SITE_OTHER): Payer: Medicare Other

## 2018-11-04 DIAGNOSIS — Z950 Presence of cardiac pacemaker: Secondary | ICD-10-CM

## 2018-11-04 DIAGNOSIS — I5022 Chronic systolic (congestive) heart failure: Secondary | ICD-10-CM

## 2018-11-05 ENCOUNTER — Telehealth: Payer: Self-pay

## 2018-11-05 NOTE — Progress Notes (Signed)
EPIC Encounter for ICM Monitoring  Patient Name: Anita Duke is a 78 y.o. female Date: 11/05/2018 Primary Care Physican: Binnie Rail, MD Primary Cardiologist:Varanasi/McLean Electrophysiologist: Allred Dry Weight:unknown Bi-V Pacing: 98%  Attempted call to patient and unable to reach.  Left detailed message per DPR regarding transmission. Transmission reviewed.   Thoracic impedancenormal but was abnormal suggesting fluid accumulation from 10/16/2018 - 11/03/2018.  Takes Spironolactone50m take 1 tablet daily  Labs: 07/29/2018 Creatinine 0.70, BUN 9, Potassium 3.8, Sodium 141, eGFR >60 07/23/2018 Creatinine 0.61, BUN 13, Potassium 3.9, Sodium 140, eGFR >60 06/12/2018 Creatinine0.52, BUN17, Potassium4.2, Sodium141 01/11/2018 Creatinine0.51, BUN15, Potassium3.5, Sodium142, [id number] complete set of results can be found in Results Review.  Recommendations:Unable to reach.  Follow-up plan: ICM clinic phone appointment on4/02/2019.   3 month ICM trend: 11/04/2018    1 Year ICM trend:       LRosalene Billings RN 11/05/2018 12:07 PM

## 2018-11-05 NOTE — Telephone Encounter (Signed)
Remote ICM transmission received.  Attempted call to patient regarding ICM remote transmission and no answer.  

## 2018-11-08 ENCOUNTER — Telehealth (HOSPITAL_COMMUNITY): Payer: Self-pay | Admitting: *Deleted

## 2018-11-08 DIAGNOSIS — I5022 Chronic systolic (congestive) heart failure: Secondary | ICD-10-CM

## 2018-11-08 NOTE — Telephone Encounter (Signed)
Pt called and stated she is sch for f/u on 5/12 but thinks she needs an echo as well.  Per chart pt does need echo, echo sch for 5/12 at 11 am, pt aware

## 2018-11-22 ENCOUNTER — Other Ambulatory Visit: Payer: Self-pay

## 2018-11-22 ENCOUNTER — Ambulatory Visit: Payer: Medicare Other | Admitting: General Practice

## 2018-11-22 ENCOUNTER — Ambulatory Visit: Payer: Medicare Other

## 2018-11-22 DIAGNOSIS — Z7901 Long term (current) use of anticoagulants: Secondary | ICD-10-CM | POA: Diagnosis not present

## 2018-11-22 DIAGNOSIS — I4891 Unspecified atrial fibrillation: Secondary | ICD-10-CM

## 2018-11-22 LAB — POCT INR: INR: 1.6 — AB (ref 2.0–3.0)

## 2018-11-22 NOTE — Patient Instructions (Addendum)
Pre visit review using our clinic review tool, if applicable. No additional management support is needed unless otherwise documented below in the visit note.  Take 2 tablets today (3/20) and then change dosage and take 1 tablet daily except 1 1/2 tablets on Monday and Fridays.  Re-check in 6 weeks.

## 2018-11-22 NOTE — Progress Notes (Signed)
Agree with management.  Niang Mitcheltree J Charyl Minervini, MD  

## 2018-12-07 ENCOUNTER — Other Ambulatory Visit: Payer: Self-pay | Admitting: Internal Medicine

## 2018-12-09 ENCOUNTER — Ambulatory Visit (INDEPENDENT_AMBULATORY_CARE_PROVIDER_SITE_OTHER): Payer: Medicare Other

## 2018-12-09 ENCOUNTER — Other Ambulatory Visit: Payer: Self-pay

## 2018-12-09 ENCOUNTER — Other Ambulatory Visit: Payer: Self-pay | Admitting: Internal Medicine

## 2018-12-09 DIAGNOSIS — I5022 Chronic systolic (congestive) heart failure: Secondary | ICD-10-CM

## 2018-12-09 DIAGNOSIS — Z950 Presence of cardiac pacemaker: Secondary | ICD-10-CM

## 2018-12-11 NOTE — Progress Notes (Signed)
EPIC Encounter for ICM Monitoring  Patient Name: Anita Duke is a 78 y.o. female Date: 12/11/2018 Primary Care Physican: Binnie Rail, MD Primary Cardiologist:Varanasi/McLean Electrophysiologist: Allred Bi-V Pacing: 98% Weight:unknown  Heart failure questions reviewed and pt asymptomatic.   Thoracic impedancenormal but was abnormal suggesting fluid accumulation from 10/16/2018 - 11/03/2018 & 11/04/2018 - 11/09/2018.  Takes Spironolactone25mg  take 1 tablet daily  Labs: 07/29/2018 Creatinine 0.70, BUN 9, Potassium 3.8, Sodium 141,GFR >60 07/23/2018 Creatinine 0.61, BUN 13, Potassium 3.9, Sodium 140, GFR >60 06/12/2018 Creatinine0.52, BUN17, Potassium4.2, Sodium141 01/11/2018 Creatinine0.51, BUN15, Potassium3.5, Sodium142, [id number] A complete set of results can be found in Results Review.  Recommendations: No changes and encouraged to call for any fluid symptoms.  Follow-up plan: ICM clinic phone appointment on5/07/2019.    3 month ICM trend: 12/09/2018    1 Year ICM trend:       [name], RN 12/11/2018 8:33 AM

## 2018-12-30 ENCOUNTER — Ambulatory Visit (INDEPENDENT_AMBULATORY_CARE_PROVIDER_SITE_OTHER): Payer: Medicare Other | Admitting: *Deleted

## 2018-12-30 ENCOUNTER — Other Ambulatory Visit: Payer: Self-pay

## 2018-12-30 DIAGNOSIS — I5022 Chronic systolic (congestive) heart failure: Secondary | ICD-10-CM

## 2018-12-30 DIAGNOSIS — I442 Atrioventricular block, complete: Secondary | ICD-10-CM

## 2018-12-31 ENCOUNTER — Ambulatory Visit (INDEPENDENT_AMBULATORY_CARE_PROVIDER_SITE_OTHER): Payer: Medicare Other | Admitting: General Practice

## 2018-12-31 DIAGNOSIS — Z7901 Long term (current) use of anticoagulants: Secondary | ICD-10-CM

## 2018-12-31 DIAGNOSIS — I4891 Unspecified atrial fibrillation: Secondary | ICD-10-CM

## 2018-12-31 LAB — CUP PACEART REMOTE DEVICE CHECK
Battery Remaining Longevity: 82 mo
Battery Remaining Percentage: 89 %
Battery Voltage: 2.95 V
Date Time Interrogation Session: 20200427080012
Implantable Lead Implant Date: 20130913
Implantable Lead Implant Date: 20130913
Implantable Lead Implant Date: 20150303
Implantable Lead Location: 753858
Implantable Lead Location: 753859
Implantable Lead Location: 753860
Implantable Lead Model: 5076
Implantable Lead Model: 5092
Implantable Pulse Generator Implant Date: 20150303
Lead Channel Impedance Value: 400 Ohm
Lead Channel Impedance Value: 440 Ohm
Lead Channel Pacing Threshold Amplitude: 0.75 V
Lead Channel Pacing Threshold Amplitude: 1 V
Lead Channel Pacing Threshold Pulse Width: 0.4 ms
Lead Channel Pacing Threshold Pulse Width: 0.6 ms
Lead Channel Sensing Intrinsic Amplitude: 9.4 mV
Lead Channel Setting Pacing Amplitude: 2 V
Lead Channel Setting Pacing Amplitude: 2 V
Lead Channel Setting Pacing Pulse Width: 0.4 ms
Lead Channel Setting Pacing Pulse Width: 0.6 ms
Lead Channel Setting Sensing Sensitivity: 3 mV
Pulse Gen Model: 3242
Pulse Gen Serial Number: 7548835

## 2018-12-31 LAB — POCT INR: INR: 2.2 (ref 2.0–3.0)

## 2018-12-31 NOTE — Patient Instructions (Addendum)
Pre visit review using our clinic review tool, if applicable. No additional management support is needed unless otherwise documented below in the visit note.  Continue to take 1 tablet daily except 1 1/2 tablets on Monday and Fridays.  Re-check in 6 weeks.

## 2018-12-31 NOTE — Progress Notes (Signed)
Agree with management.  Haron Beilke J Esley Brooking, MD  

## 2019-01-03 ENCOUNTER — Ambulatory Visit: Payer: Medicare Other

## 2019-01-08 NOTE — Progress Notes (Signed)
Remote pacemaker transmission.   

## 2019-01-09 ENCOUNTER — Telehealth (HOSPITAL_COMMUNITY): Payer: Self-pay | Admitting: Cardiology

## 2019-01-09 NOTE — Telephone Encounter (Signed)
Called patient and left message that we had rescheduled Echo & f/u with Dr. Aundra Dubin d/t Covid restrictions.  I asked pt to call our office to confirm the new appt date and times.

## 2019-01-10 ENCOUNTER — Other Ambulatory Visit (HOSPITAL_COMMUNITY): Payer: Self-pay | Admitting: Internal Medicine

## 2019-01-13 ENCOUNTER — Ambulatory Visit (INDEPENDENT_AMBULATORY_CARE_PROVIDER_SITE_OTHER): Payer: Medicare Other

## 2019-01-13 ENCOUNTER — Encounter: Payer: Medicare Other | Admitting: Internal Medicine

## 2019-01-13 ENCOUNTER — Other Ambulatory Visit: Payer: Self-pay

## 2019-01-13 DIAGNOSIS — Z950 Presence of cardiac pacemaker: Secondary | ICD-10-CM

## 2019-01-13 DIAGNOSIS — I5022 Chronic systolic (congestive) heart failure: Secondary | ICD-10-CM | POA: Diagnosis not present

## 2019-01-14 ENCOUNTER — Encounter (HOSPITAL_COMMUNITY): Payer: Medicare Other | Admitting: Cardiology

## 2019-01-14 ENCOUNTER — Ambulatory Visit (HOSPITAL_COMMUNITY): Payer: Medicare Other

## 2019-01-17 NOTE — Progress Notes (Signed)
EPIC Encounter for ICM Monitoring  Patient Name: Anita Duke is a 78 y.o. female Date: 01/17/2019 Primary Care Physican: Binnie Rail, MD Primary Cardiologist:Varanasi/McLean Electrophysiologist: Allred Bi-V Pacing: 98% Weight:unknown  Transmission reviewed and results sent via mychart  Thoracic impedancenormalbut was abnormal suggesting fluid accumulation from 12/30/2018 - 01/07/2019.  Takes Spironolactone25mg  take 1 tablet daily  Labs: 07/29/2018 Creatinine 0.70, BUN 9, Potassium 3.8, Sodium 141,GFR >60 07/23/2018 Creatinine 0.61, BUN 13, Potassium 3.9, Sodium 140, GFR >60 06/12/2018 Creatinine0.52, BUN17, Potassium4.2, Sodium141 01/11/2018 Creatinine0.51, BUN15, Potassium3.5, Sodium142, [id number] A complete set of results can be found in Results Review.  Recommendations: Advised to limit salt and fluid intake  Follow-up plan: ICM clinic phone appointment on6/15/2020.   3 month ICM trend: 01/13/2019    1 Year ICM trend:       [name], RN 01/17/2019 8:43 AM

## 2019-01-20 ENCOUNTER — Ambulatory Visit: Payer: Medicare Other

## 2019-02-11 ENCOUNTER — Ambulatory Visit (INDEPENDENT_AMBULATORY_CARE_PROVIDER_SITE_OTHER): Payer: Medicare Other | Admitting: General Practice

## 2019-02-11 ENCOUNTER — Other Ambulatory Visit: Payer: Self-pay

## 2019-02-11 DIAGNOSIS — I4891 Unspecified atrial fibrillation: Secondary | ICD-10-CM

## 2019-02-11 DIAGNOSIS — Z7901 Long term (current) use of anticoagulants: Secondary | ICD-10-CM | POA: Diagnosis not present

## 2019-02-11 LAB — POCT INR: INR: 1.9 — AB (ref 2.0–3.0)

## 2019-02-11 NOTE — Patient Instructions (Addendum)
Pre visit review using our clinic review tool, if applicable. No additional management support is needed unless otherwise documented below in the visit note.  Take 1 1/2 tablets today (6/9) and then change dosage and take 1 tablet daily except 1 1/2 tablets on Monday/Wednesday and Fridays.  Re-check in 4 weeks.

## 2019-02-11 NOTE — Progress Notes (Signed)
Agree with management.  Anita Badour J Lowery Paullin, MD  

## 2019-02-17 ENCOUNTER — Ambulatory Visit (INDEPENDENT_AMBULATORY_CARE_PROVIDER_SITE_OTHER): Payer: Medicare Other

## 2019-02-17 DIAGNOSIS — I5022 Chronic systolic (congestive) heart failure: Secondary | ICD-10-CM

## 2019-02-17 DIAGNOSIS — Z950 Presence of cardiac pacemaker: Secondary | ICD-10-CM | POA: Diagnosis not present

## 2019-02-19 NOTE — Progress Notes (Signed)
EPIC Encounter for ICM Monitoring  Patient Name: Anita Duke is a 78 y.o. female Date: 02/19/2019 Primary Care Physican: Binnie Rail, MD Primary Cardiologist:Varanasi/McLean Electrophysiologist: Allred Bi-V Pacing: 98% Weight:unknown  Transmission reviewed and results sent via [facility] Thoracic impedancenormalbut was abnormal suggesting possible fluid accumulation from 02/01/2019 - 02/08/2019.  Takes Spironolactone25mg  take 1 tablet daily  Labs: 07/29/2018 Creatinine 0.70, BUN 9, Potassium 3.8, Sodium 141,GFR >60 07/23/2018 Creatinine 0.61, BUN 13, Potassium 3.9, Sodium 140, GFR >60 06/12/2018 Creatinine0.52, BUN17, Potassium4.2, Sodium141 01/11/2018 Creatinine0.51, BUN15, Potassium3.5, Sodium142, [id number] A complete set of results can be found in Results Review.  Recommendations: Advised to limit salt and fluid intake  Follow-up plan: ICM clinic phone appointment on 04/01/2019.   Copy of ICM check sent to Dr. Rayann Heman.   3 month ICM trend: 02/17/2019    1 Year ICM trend:       [name], RN 02/19/2019 7:44 AM

## 2019-03-03 ENCOUNTER — Telehealth: Payer: Self-pay

## 2019-03-03 NOTE — Telephone Encounter (Signed)
Returned patient call as requested by voice mail message.  She stated she had called the office to make a yearly office appointment to the device checked but the person answering phone told her that Dr Aundra Dubin would set that up.  Advised that Dr Claris Gladden office does not set up appointments for device checks with Dr Rayann Heman and will send a message to Dr Jackalyn Lombard scheduler to call her to make an appointment.  She said that will be fine.

## 2019-03-04 NOTE — Patient Instructions (Addendum)
Tests ordered today. Your results will be released to [facility] (or called to you) after review, usually within 72hours after test completion. If any changes need to be made, you will be notified at that same time.  All other Health Maintenance issues reviewed.   All recommended immunizations and age-appropriate screenings are up-to-date or discussed.  No immunizations administered today.   Medications reviewed and updated.  Changes include :   Increase Wellbutrin to 300 mg daily.   Your prescription(s) have been submitted to your pharmacy. Please take as directed and contact our office if you believe you are having problem(s) with the medication(s).    Please followup in 6 months    Health Maintenance, Female Adopting a healthy lifestyle and getting preventive care are important in promoting health and wellness. Ask your health care provider about:  The right schedule for you to have regular tests and exams.  Things you can do on your own to prevent diseases and keep yourself healthy. What should I know about diet, weight, and exercise? Eat a healthy diet   Eat a diet that includes plenty of vegetables, fruits, low-fat dairy products, and lean protein.  Do not eat a lot of foods that are high in solid fats, added sugars, or sodium. Maintain a healthy weight Body mass index (BMI) is used to identify weight problems. It estimates body fat based on height and weight. Your health care provider can help determine your BMI and help you achieve or maintain a healthy weight. Get regular exercise Get regular exercise. This is one of the most important things you can do for your health. Most adults should:  Exercise for at least 150 minutes each week. The exercise should increase your heart rate and make you sweat (moderate-intensity exercise).  Do strengthening exercises at least twice a week. This is in addition to the moderate-intensity exercise.  Spend less time sitting. Even light  physical activity can be beneficial. Watch cholesterol and blood lipids Have your blood tested for lipids and cholesterol at 79 years of age, then have this test every 5 years. Have your cholesterol levels checked more often if:  Your lipid or cholesterol levels are high.  You are older than 79 years of age.  You are at high risk for heart disease. What should I know about cancer screening? Depending on your health history and family history, you may need to have cancer screening at various ages. This may include screening for:  Breast cancer.  Cervical cancer.  Colorectal cancer.  Skin cancer.  Lung cancer. What should I know about heart disease, diabetes, and high blood pressure? Blood pressure and heart disease  High blood pressure causes heart disease and increases the risk of stroke. This is more likely to develop in people who have high blood pressure readings, are of African descent, or are overweight.  Have your blood pressure checked: ? Every 3-5 years if you are 105-43 years of age. ? Every year if you are 36 years old or older. Diabetes Have regular diabetes screenings. This checks your fasting blood sugar level. Have the screening done:  Once every three years after age 75 if you are at a normal weight and have a low risk for diabetes.  More often and at a younger age if you are overweight or have a high risk for diabetes. What should I know about preventing infection? Hepatitis B If you have a higher risk for hepatitis B, you should be screened for this virus. Talk with  your health care provider to find out if you are at risk for hepatitis B infection. Hepatitis C Testing is recommended for:  Everyone born from [birth year] through 1966.  Anyone with known risk factors for hepatitis C. Sexually transmitted infections (STIs)  Get screened for STIs, including gonorrhea and chlamydia, if: ? You are sexually active and are younger than 79 years of age. ? You are older  than 79 years of age and your health care provider tells you that you are at risk for this type of infection. ? Your sexual activity has changed since you were last screened, and you are at increased risk for chlamydia or gonorrhea. Ask your health care provider if you are at risk.  Ask your health care provider about whether you are at high risk for HIV. Your health care provider may recommend a prescription medicine to help prevent HIV infection. If you choose to take medicine to prevent HIV, you should first get tested for HIV. You should then be tested every 3 months for as long as you are taking the medicine. Pregnancy  If you are about to stop having your period (premenopausal) and you may become pregnant, seek counseling before you get pregnant.  Take 400 to 800 micrograms (mcg) of folic acid every day if you become pregnant.  Ask for birth control (contraception) if you want to prevent pregnancy. Osteoporosis and menopause Osteoporosis is a disease in which the bones lose minerals and strength with aging. This can result in bone fractures. If you are 48 years old or older, or if you are at risk for osteoporosis and fractures, ask your health care provider if you should:  Be screened for bone loss.  Take a calcium or vitamin D supplement to lower your risk of fractures.  Be given hormone replacement therapy (HRT) to treat symptoms of menopause. Follow these instructions at home: Lifestyle  Do not use any products that contain nicotine or tobacco, such as cigarettes, e-cigarettes, and chewing tobacco. If you need help quitting, ask your health care provider.  Do not use street drugs.  Do not share needles.  Ask your health care provider for help if you need support or information about quitting drugs. Alcohol use  Do not drink alcohol if: ? Your health care provider tells you not to drink. ? You are pregnant, may be pregnant, or are planning to become pregnant.  If you drink  alcohol: ? Limit how much you use to 0-1 drink a day. ? Limit intake if you are breastfeeding.  Be aware of how much alcohol is in your drink. In the [place] one drink equals one 12 oz bottle of beer (355 mL), one 5 oz glass of wine (148 mL), or one 1 oz glass of hard liquor (44 mL). General instructions  Schedule regular health, dental, and eye exams.  Stay current with your vaccines.  Tell your health care provider if: ? You often feel depressed. ? You have ever been abused or do not feel safe at home. Summary  Adopting a healthy lifestyle and getting preventive care are important in promoting health and wellness.  Follow your health care provider's instructions about healthy diet, exercising, and getting tested or screened for diseases.  Follow your health care provider's instructions on monitoring your cholesterol and blood pressure. This information is not intended to replace advice given to you by your health care provider. Make sure you discuss any questions you have with your health care provider. Document Released: 03/06/2011 Document  Revised: 08/14/2018 Document Reviewed: 08/14/2018 Elsevier Patient Education  [place].

## 2019-03-04 NOTE — Progress Notes (Signed)
Subjective:    Patient ID: Anita Duke, female    DOB: 1941-02-10, 78 y.o.   MRN: [id number]  HPI She is here for a physical exam.    She has a mole on her neck that is irritated.  She noticed it about 2 weeks ago and has grown in size.  She sees dermatology in September.  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  Medications and allergies reviewed with patient and updated if appropriate.  Patient Active Problem List   Diagnosis Date Noted  . Depression 07/19/2018  . Lumbar radiculopathy, acute 06/17/2018  . Sweating abnormality 04/24/2018  . Diabetic neuropathy ([place]) 02/21/2018  . Long term (current) use of anticoagulants 06/01/2017  . 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 Excessive sweating 07/07/2016  . Osteopenia 01/10/2016  . Encounter for therapeutic drug monitoring 10/08/2015  . Heat intolerance 10/08/2015  . CAD (coronary artery disease) 05/28/2014  . Vitamin D deficiency 02/20/2014  . Long term current use of anticoagulant therapy 11/24/2013  . Chronic systolic dysfunction of left ventricle 05/21/2013  . 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 Acquired complete AV block   . Sick sinus syndrome ([place])   . Pacemaker-Medtronic 05/20/2012  . Allergic rhinitis 04/25/2012  . Mild intermittent asthma 02/16/2011  . Ejection fraction < 50%   . Atrial fibrillation ([place])   . Mitral regurgitation   . Right ventricular dysfunction   . Mitral stenosis   . S/P mitral valve repair   . Hx of CABG   . Diabetes type II 03/02/2009  . Hyperlipidemia 07/07/2008  . Essential hypertension 07/07/2008  . Diverticulosis of large intestine 08/04/2002    Current Outpatient Medications on File Prior to Visit  Medication Sig Dispense Refill  . ACCU-CHEK SOFTCLIX LANCETS lancets USE TO CHECK BLOOD SUGARS  TWICE A DAY 200 each 2  . acetaminophen (TYLENOL) 325 MG tablet Take 650 mg  by mouth every 6 (six) hours as needed (pain).     Marland Kitchen amoxicillin (AMOXIL) 500 MG capsule Take 4 tabs prior to dental appointments 30 capsule 0  . atorvastatin (LIPITOR) 40 MG tablet TAKE 1 TABLET BY MOUTH  DAILY (Patient taking differently: Take 40 mg by mouth daily. ) 90 tablet 3  . Blood Glucose Monitoring Suppl (ACCU-CHEK AVIVA PLUS) w/Device KIT Use to check blood sugars daily Dx E11.9 1 kit 0  . buPROPion (WELLBUTRIN XL) 150 MG 24 hr tablet TAKE 1 TABLET BY MOUTH  DAILY 90 tablet 1  . carvedilol (COREG) 6.25 MG tablet TAKE 1 TABLET BY MOUTH  TWICE A DAY WITH MEALS 180 tablet 3  . cholecalciferol (VITAMIN D) 1000 UNITS tablet Take 6,000 Units by mouth daily.     . fish oil-omega-3 fatty acids 1000 MG capsule Take 1 g by mouth 2 (two) times daily.     Marland Kitchen glucose blood (ACCU-CHEK AVIVA PLUS) test strip USE 2 TIMES DAILY AS  DIRECTED 200 each 2  . JARDIANCE 25 MG TABS tablet TAKE 1 TABLET BY MOUTH  DAILY. 90 tablet 1  . loratadine (CLARITIN) 10 MG tablet Take 10 mg by mouth daily.    Marland Kitchen spironolactone (ALDACTONE) 25 MG tablet TAKE 1 TABLET BY MOUTH  DAILY 90 tablet 1  . trandolapril (MAVIK) 4 MG tablet TAKE 1 TABLET BY MOUTH TWO  TIMES DAILY FOR BLOOD  PRESSURE 180 tablet 1  . warfarin (COUMADIN) 2.5 MG  tablet Take 1 tablet daily or TAKE AS DIRECTED BY  ANTICOAGULATION CLINIC 90 day 100 tablet 1   No current facility-administered medications on file prior to visit.     Past Medical History:  Diagnosis Date  . Anemia    Hemoglobin 10.4, December, 2013  . Arthritis    "not bad; little in my hands; some in my knees" (11/04/2013)  . Asthma   . CAD (coronary artery disease)    LIMA to the LAD at time of mitral valve repair / LIMA atretic,, February, 2011  . Cataracts, bilateral   . Cecal cancer ([place]) 2014   colon  . CHF (congestive heart failure) ([place])   . Colon polyp, hyperplastic   . Complication of anesthesia   . Depression   . Diverticulosis of colon   . GERD (gastroesophageal reflux  disease)   . Hemorrhoids, internal   . High cholesterol   . HTN (hypertension)   . Mitral regurgitation    a. s/p repair with LAA ligation and MAZE at time of surgery  . Permanent atrial fibrillation    a. s/p MAZE b. s/p AVN ablation  . PONV (postoperative nausea and vomiting)   . Presence of permanent cardiac pacemaker   . Pulmonary HTN ([place])   . QT prolongation    Tikosyn and Effexor. QT prolonged October 13, 2011, peak is in dose reduced from 500  to -250 twice a day  . Type II diabetes mellitus ([place])     Past Surgical History:  Procedure Laterality Date  . ABDOMINAL HYSTERECTOMY  ?2002  . AV NODE ABLATION N/A 05/28/2012   Procedure: AV NODE ABLATION;  Surgeon: Thompson Grayer, MD;  Location: [facility] CATH LAB;  Service: Cardiovascular;  Laterality: N/A;  . BI-VENTRICULAR PACEMAKER UPGRADE N/A 11/04/2013   upgrade of previously implanted dual chamber pacemaker to STJ Quadra Allure CRTP by Dr Rayann Heman  . BREAST EXCISIONAL BIOPSY Left over 10 years ago   benign  . BREAST SURGERY    . CARDIOVERSION  02/08/2012   Procedure: CARDIOVERSION;  Surgeon: Carlena Bjornstad, MD;  Location: [facility];  Service: Cardiovascular;  Laterality: N/A;  . CARDIOVERSION  02/29/2012   Procedure: CARDIOVERSION;  Surgeon: Carlena Bjornstad, MD;  Location: [facility];  Service: Cardiovascular;  Laterality: N/A;  . CARDIOVERSION  05/15/2012   Procedure: CARDIOVERSION;  Surgeon: Carlena Bjornstad, MD;  Location: [facility];  Service: Cardiovascular;  Laterality: N/A;  . CATARACT EXTRACTION, BILATERAL  2017  . CHOLECYSTECTOMY  1995  . CORONARY ARTERY BYPASS GRAFT  2001   CABG X1 "at time of mitral valve repair" (12/04/2012  . DIAGNOSTIC LAPAROSCOPIC LIVER BIOPSY Left 12/03/2012   Procedure: DIAGNOSTIC LAPAROSCOPIC LIVER BIOPSY;  Surgeon: Stark Klein, MD;  Location: [facility];  Service: General;  Laterality: Left;  . EYE SURGERY     bilateral cataract surgery  . LAPAROSCOPIC RIGHT HEMI COLECTOMY  12/03/2012   Procedure: LAPAROSCOPIC RIGHT HEMI  COLECTOMY;  Surgeon: Stark Klein, MD;  Location: [facility];  Service: General;;  . LUMBAR LAMINECTOMY/ DECOMPRESSION WITH MET-RX Left 06/17/2018   Procedure: left Lumbar three-fourextraforaminal Microdiscectomy with Met-Rx;  Surgeon: Kristeen Miss, MD;  Location: [facility];  Service: Neurosurgery;  Laterality: Left;  Marland Kitchen MAZE  2001   w/ MVR & CABG  . MITRAL VALVE REPAIR  2001   "anterior and posterior leaflets" (12/04/2012)  . 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 TONSILLECTOMY AND ADENOIDECTOMY  ~  8341  . UMBILICAL HERNIA REPAIR N/A 12/03/2012   Procedure: HERNIA REPAIR UMBILICAL ADULT;  Surgeon: Stark Klein, MD;  Location: [facility];  Service: General;  Laterality: N/A;    Social History   Socioeconomic History  . Marital status: Widowed    Spouse name: Not on file  . Number of children: 0  . Years of education: Not on file  . Highest education level: Not on file  Occupational History  . 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 Financial resource strain: Not on file  . Food insecurity    Worry: Not on file    Inability: Not on file  . Transportation needs    Medical: Not on file    Non-medical: Not on file  Tobacco Use  . Smoking status: Never Smoker  . Smokeless tobacco: Never Used  Substance and Sexual Activity  . Alcohol use: No  . Drug use: No  . Sexual activity: Never    Birth control/protection: Surgical  Lifestyle  . Physical activity    Days per week: Not on file    Minutes per session: Not on file  . Stress: Not on file  Relationships  . Social Herbalist on phone: Not on file    Gets together: Not on file    Attends religious service: Not on file    Active member of club or organization: Not on file    Attends meetings of clubs or organizations: Not on file    Relationship status: Not on file   Other Topics Concern  . Not on file  Social History Narrative       Family History  Problem Relation Age of Onset  . Heart disease Father   . Diabetes Father   . Heart disease Sister        x 2  . Diabetes Mother   . Kidney disease Mother   . Thyroid disease Mother   . Pancreatic cancer Maternal Grandfather   . Diabetes Paternal Grandfather   . Diabetes Paternal Grandmother   . Heart disease Paternal Uncle        x 6  . Prostate cancer Paternal Uncle   . Colon cancer Neg Hx   . Colon polyps Neg Hx   . Adrenal disorder Neg Hx     Review of Systems  Constitutional: Negative for chills and fever.  Eyes: Negative for visual disturbance.  Respiratory: Negative for cough, shortness of breath and wheezing.   Cardiovascular: Negative for chest pain, palpitations and leg swelling.  Gastrointestinal: Negative for abdominal pain, blood in stool, constipation, diarrhea and nausea.       No gerd  Genitourinary: Negative for dysuria and hematuria.  Musculoskeletal: Positive for back pain. Negative for arthralgias.  Skin: Positive for color change (redness over a mole). Negative for rash.  Neurological: Negative for light-headedness and headaches.  Psychiatric/Behavioral: Positive for dysphoric mood. The patient is not nervous/anxious.        Objective:   Vitals:   03/05/19 1441  BP: (!) 142/70  Pulse: 69  Resp: 16  Temp: 97.9 F (36.6 C)  SpO2: 97%   Filed Weights   03/05/19 1441  Weight: 143 lb 12.8 oz (65.2 kg)   Body mass index is 26.3 kg/m.  BP Readings from Last 3 Encounters:  03/05/19 (!) 142/70  07/23/18 (!) 149/70  07/19/18 (!) 148/72  Wt Readings from Last 3 Encounters:  03/05/19 143 lb 12.8 oz (65.2 kg)  07/23/18 143 lb 12.8 oz (65.2 kg)  07/19/18 144 lb (65.3 kg)     Physical Exam Constitutional: She appears well-developed and well-nourished. No distress.  HENT:  Head: Normocephalic and atraumatic.  Right Ear: External ear normal. Normal ear  canal and TM Left Ear: External ear normal.  Normal ear canal and TM Mouth/Throat: Oropharynx is clear and moist.  Eyes: Conjunctivae and EOM are normal.  Neck: Neck supple. No tracheal deviation present. No thyromegaly present.  No carotid bruit  Cardiovascular: Normal rate, regular rhythm and normal heart sounds.   No murmur heard.  No edema. Pulmonary/Chest: Effort normal and breath sounds normal. No respiratory distress. She has no wheezes. She has no rales.  Breast: deferred   Abdominal: Soft. She exhibits no distension. There is no tenderness.  Lymphadenopathy: She has no cervical adenopathy.  Skin: Skin is warm and dry. She is not diaphoretic.  Large tic on the left side of neck where she thought the mole was.  This was successfully removed with tweezers.  The entire tick was removed.  There is an area of erythema and a slight scab where the tick was attached.  No open wound or discharge.  No bleeding. Psychiatric: She has a normal mood and affect. Her behavior is normal.   Diabetic Foot Exam - Simple   Simple Foot Form Diabetic Foot exam was performed with the following findings: Yes 03/05/2019  3:53 PM  Visual Inspection No deformities, no ulcerations, no other skin breakdown bilaterally: Yes See comments: Yes Sensation Testing Intact to touch and monofilament testing bilaterally: Yes Pulse Check Posterior Tibialis and Dorsalis pulse intact bilaterally: Yes Comments Mild bunions         Assessment & Plan:   Physical exam: Screening blood work ordered Immunizations   All up to date Colonoscopy   Up to date  Mammogram   Scheduled for 7/2 Dexa   Due - ordered Eye exams   Due  -  Will schedule Exercise   Walking  Weight -   Weight is good for age Skin  Mole-actually an embedded tick that was removed.  Sees dermatology for full skin check in September Substance abuse  none  See Problem List for Assessment and Plan of chronic medical problems.   Fu in 6 months

## 2019-03-05 ENCOUNTER — Ambulatory Visit: Payer: Medicare Other

## 2019-03-05 ENCOUNTER — Encounter: Payer: Medicare Other | Admitting: Internal Medicine

## 2019-03-05 ENCOUNTER — Encounter: Payer: Self-pay | Admitting: Internal Medicine

## 2019-03-05 ENCOUNTER — Other Ambulatory Visit (INDEPENDENT_AMBULATORY_CARE_PROVIDER_SITE_OTHER): Payer: Medicare Other

## 2019-03-05 ENCOUNTER — Ambulatory Visit (INDEPENDENT_AMBULATORY_CARE_PROVIDER_SITE_OTHER): Payer: Medicare Other | Admitting: Internal Medicine

## 2019-03-05 ENCOUNTER — Other Ambulatory Visit: Payer: Self-pay

## 2019-03-05 VITALS — BP 142/70 | HR 69 | Temp 97.9°F | Resp 16 | Ht 62.0 in | Wt 143.8 lb

## 2019-03-05 DIAGNOSIS — S1095XA Superficial foreign body of unspecified part of neck, initial encounter: Secondary | ICD-10-CM

## 2019-03-05 DIAGNOSIS — E1159 Type 2 diabetes mellitus with other circulatory complications: Secondary | ICD-10-CM

## 2019-03-05 DIAGNOSIS — I4891 Unspecified atrial fibrillation: Secondary | ICD-10-CM | POA: Diagnosis not present

## 2019-03-05 DIAGNOSIS — I10 Essential (primary) hypertension: Secondary | ICD-10-CM

## 2019-03-05 DIAGNOSIS — I2510 Atherosclerotic heart disease of native coronary artery without angina pectoris: Secondary | ICD-10-CM | POA: Diagnosis not present

## 2019-03-05 DIAGNOSIS — F3289 Other specified depressive episodes: Secondary | ICD-10-CM

## 2019-03-05 DIAGNOSIS — Z1839 Other retained organic fragments: Secondary | ICD-10-CM

## 2019-03-05 DIAGNOSIS — E782 Mixed hyperlipidemia: Secondary | ICD-10-CM

## 2019-03-05 DIAGNOSIS — Z0000 Encounter for general adult medical examination without abnormal findings: Secondary | ICD-10-CM | POA: Diagnosis not present

## 2019-03-05 DIAGNOSIS — M85852 Other specified disorders of bone density and structure, left thigh: Secondary | ICD-10-CM

## 2019-03-05 LAB — CBC WITH DIFFERENTIAL/PLATELET
Basophils Absolute: 0 10*3/uL (ref 0.0–0.1)
Basophils Relative: 0.5 % (ref 0.0–3.0)
Eosinophils Absolute: 0.1 10*3/uL (ref 0.0–0.7)
Eosinophils Relative: 1.1 % (ref 0.0–5.0)
HCT: 45.2 % (ref 36.0–46.0)
Hemoglobin: 15.1 g/dL — ABNORMAL HIGH (ref 12.0–15.0)
Lymphocytes Relative: 23.3 % (ref 12.0–46.0)
Lymphs Abs: 1.5 10*3/uL (ref 0.7–4.0)
MCHC: 33.3 g/dL (ref 30.0–36.0)
MCV: 90.8 fl (ref 78.0–100.0)
Monocytes Absolute: 0.7 10*3/uL (ref 0.1–1.0)
Monocytes Relative: 11.5 % (ref 3.0–12.0)
Neutro Abs: 4.1 10*3/uL (ref 1.4–7.7)
Neutrophils Relative %: 63.6 % (ref 43.0–77.0)
Platelets: 190 10*3/uL (ref 150.0–400.0)
RBC: 4.98 Mil/uL (ref 3.87–5.11)
RDW: 14 % (ref 11.5–15.5)
WBC: 6.4 10*3/uL (ref 4.0–10.5)

## 2019-03-05 LAB — COMPREHENSIVE METABOLIC PANEL
ALT: 16 U/L (ref 0–35)
AST: 16 U/L (ref 0–37)
Albumin: 4.6 g/dL (ref 3.5–5.2)
Alkaline Phosphatase: 68 U/L (ref 39–117)
BUN: 17 mg/dL (ref 6–23)
CO2: 30 mEq/L (ref 19–32)
Calcium: 9.5 mg/dL (ref 8.4–10.5)
Chloride: 103 mEq/L (ref 96–112)
Creatinine, Ser: 0.64 mg/dL (ref 0.40–1.20)
GFR: 89.48 mL/min (ref >60.00)
Glucose, Bld: 117 mg/dL — ABNORMAL HIGH (ref 70–99)
Potassium: 3.9 mEq/L (ref 3.5–5.1)
Sodium: 141 mEq/L (ref 135–145)
Total Bilirubin: 0.9 mg/dL (ref 0.2–1.2)
Total Protein: 7.3 g/dL (ref 6.0–8.3)

## 2019-03-05 LAB — HEMOGLOBIN A1C: Hgb A1c MFr Bld: 7 % — ABNORMAL HIGH (ref 4.6–6.5)

## 2019-03-05 LAB — TSH: TSH: 0.82 u[IU]/mL (ref 0.35–4.50)

## 2019-03-05 MED ORDER — AMOXICILLIN 500 MG PO CAPS: ORAL_CAPSULE | ORAL | 0 refills | Status: DC

## 2019-03-05 MED ORDER — BUPROPION HCL ER (XL) 300 MG PO TB24: 300.0000 mg | ORAL_TABLET | Freq: Every day | ORAL | 1 refills | Status: DC

## 2019-03-05 MED FILL — AMOXICILLIN 500 MG CAPSULE: 500 | 7 days supply | Qty: 30 | Fill #0

## 2019-03-05 MED FILL — buPROPion HCL ER (XL) 300 M: 300 | 90 days supply | Qty: 90 | Fill #0

## 2019-03-05 NOTE — Assessment & Plan Note (Signed)
BP well controlled Current regimen effective and well tolerated Continue current medications at current doses cmp  

## 2019-03-05 NOTE — Assessment & Plan Note (Signed)
Continue statin Check lipid panel, CMP, TSH Continue regular exercise and heart healthy diet

## 2019-03-05 NOTE — Assessment & Plan Note (Signed)
Asymptomatic  On warfarin, coreg Cmp, cbc

## 2019-03-05 NOTE — Assessment & Plan Note (Signed)
We will check A1c Encouraged her to continue walking-increase if possible Low sugar/carbohydrate diet Continue current medications We will schedule eye exam when able

## 2019-03-05 NOTE — Assessment & Plan Note (Signed)
wellbutrin has helped - she would like to increase it Will increase to 300 mg daily

## 2019-03-05 NOTE — Assessment & Plan Note (Signed)
DEXA due-ordered She is doing some walking-encouraged her to continue walking and increase if possible Continue vitamin D

## 2019-03-05 NOTE — Assessment & Plan Note (Signed)
No chest pain Continue current medications Cbc, cmp, tsh

## 2019-03-05 NOTE — Assessment & Plan Note (Signed)
Large tick on left side of neck. With verbal consent tick was successfully removed with tweezers-entire tick was removed Area where tick was attached is slightly erythematous and there is a slight scab-erythema looks from irritation, not infection It appears to be a dog tick and was likely attached for at least 2 weeks She is not having any concerning symptoms Discussed symptoms to monitor for

## 2019-03-06 ENCOUNTER — Encounter: Payer: Self-pay | Admitting: Internal Medicine

## 2019-03-06 ENCOUNTER — Ambulatory Visit
Admission: RE | Admit: 2019-03-06 | Discharge: 2019-03-06 | Disposition: A | Discharge status: Home / Self Care | Payer: Medicare Other | Source: Ambulatory Visit | Attending: Internal Medicine | Admitting: Internal Medicine

## 2019-03-06 DIAGNOSIS — Z1231 Encounter for screening mammogram for malignant neoplasm of breast: Secondary | ICD-10-CM

## 2019-03-06 LAB — LIPID PANEL
Cholesterol: 128 mg/dL (ref 0–200)
HDL: 60.7 mg/dL (ref >39.00)
LDL Cholesterol: 37 mg/dL (ref 0–99)
NonHDL: 67.69
Total CHOL/HDL Ratio: 2
Triglycerides: 152 mg/dL — ABNORMAL HIGH (ref 0.0–149.0)
VLDL: 30.4 mg/dL (ref 0.0–40.0)

## 2019-03-11 ENCOUNTER — Other Ambulatory Visit: Payer: Self-pay | Admitting: Internal Medicine

## 2019-03-11 ENCOUNTER — Ambulatory Visit (INDEPENDENT_AMBULATORY_CARE_PROVIDER_SITE_OTHER)
Admission: RE | Admit: 2019-03-11 | Discharge: 2019-03-11 | Disposition: A | Discharge status: Home / Self Care | Payer: Medicare Other | Source: Ambulatory Visit | Attending: Internal Medicine | Admitting: Internal Medicine

## 2019-03-11 ENCOUNTER — Ambulatory Visit (INDEPENDENT_AMBULATORY_CARE_PROVIDER_SITE_OTHER): Payer: Medicare Other | Admitting: General Practice

## 2019-03-11 ENCOUNTER — Other Ambulatory Visit: Payer: Self-pay

## 2019-03-11 DIAGNOSIS — Z7901 Long term (current) use of anticoagulants: Secondary | ICD-10-CM | POA: Diagnosis not present

## 2019-03-11 DIAGNOSIS — M85852 Other specified disorders of bone density and structure, left thigh: Secondary | ICD-10-CM | POA: Diagnosis not present

## 2019-03-11 DIAGNOSIS — R921 Mammographic calcification found on diagnostic imaging of breast: Secondary | ICD-10-CM

## 2019-03-11 DIAGNOSIS — I4891 Unspecified atrial fibrillation: Secondary | ICD-10-CM

## 2019-03-11 LAB — POCT INR: INR: 2.8 (ref 2.0–3.0)

## 2019-03-11 NOTE — Patient Instructions (Addendum)
Pre visit review using our clinic review tool, if applicable. No additional management support is needed unless otherwise documented below in the visit note.  Continue to take 1 tablet daily except 1 1/2 tablets on Monday/Wednesday and Fridays.  Re-check in 4 weeks.

## 2019-03-13 ENCOUNTER — Ambulatory Visit
Admission: RE | Admit: 2019-03-13 | Discharge: 2019-03-13 | Disposition: A | Discharge status: Home / Self Care | Payer: Medicare Other | Source: Ambulatory Visit | Attending: Internal Medicine | Admitting: Internal Medicine

## 2019-03-13 ENCOUNTER — Other Ambulatory Visit: Payer: Self-pay

## 2019-03-13 ENCOUNTER — Other Ambulatory Visit: Payer: Self-pay | Admitting: Internal Medicine

## 2019-03-13 DIAGNOSIS — R921 Mammographic calcification found on diagnostic imaging of breast: Secondary | ICD-10-CM | POA: Diagnosis not present

## 2019-03-14 ENCOUNTER — Telehealth: Payer: Self-pay | Admitting: Internal Medicine

## 2019-03-14 ENCOUNTER — Telehealth (HOSPITAL_COMMUNITY): Payer: Self-pay

## 2019-03-14 NOTE — Telephone Encounter (Signed)
Patient called stating she was getting a breast cancer biopsy next week and needs guidance on how she can stop/taper coumadin, will send to [facility] for further instructions.

## 2019-03-14 NOTE — Telephone Encounter (Signed)
Called the [facility] to see about the bleed risk of the needle biopsy and if they want patient to hold coumadin. Per [facility] patient does NOT need to hold coumadin. Patient made aware. Appreciative of the call.

## 2019-03-14 NOTE — Telephone Encounter (Signed)
Patient is calling for advice from [facility], Patient has to have a procedure, bioposy next Thursday 03/20/2019 and the patient would like to know how to come off of her coumadin or even if she needs to? Please advise Cb- [phone]

## 2019-03-17 ENCOUNTER — Encounter: Payer: Self-pay | Admitting: Internal Medicine

## 2019-03-17 ENCOUNTER — Telehealth: Payer: Self-pay | Admitting: General Practice

## 2019-03-17 ENCOUNTER — Other Ambulatory Visit: Payer: Self-pay | Admitting: General Practice

## 2019-03-17 DIAGNOSIS — Z7901 Long term (current) use of anticoagulants: Secondary | ICD-10-CM

## 2019-03-17 MED ORDER — ENOXAPARIN SODIUM 100 MG/ML ~~LOC~~ SOLN: 100.0000 mg | SUBCUTANEOUS | 0 refills | Status: DC

## 2019-03-17 MED FILL — ENOXAPARIN 100 MG/ML SYR: 100 | 5 days supply | Qty: 5 | Fill #0

## 2019-03-17 NOTE — Telephone Encounter (Signed)
-----   Message from Binnie Rail, MD sent at 03/17/2019 11:07 AM EDT ----- Yes, to be on the safe side.  You know that better than I do - do you mind prescribing it?  ----- Message ----- From: Warden Fillers, RN Sent: 03/17/2019  10:55 AM EDT To: Binnie Rail, MD  Yes, I do agree.  Lovenox bridge? ----- Message ----- From: Binnie Rail, MD Sent: 03/17/2019  10:49 AM EDT To: Warden Fillers, RN  Jenny Reichmann,   Since you are the expert I wanted to confirm with you -- Anita Duke is having a biopsy of her breast on Friday - I think she should stop the warfarin for 5 days  - do you agree?  You probably remember but she has afib with mitral valve repair ( not replacement).     Marzetta Board

## 2019-03-17 NOTE — Telephone Encounter (Signed)
LMOVM for patient to call the provider that will be doing the biopsy to find out if stopping coumadin is necessary.  I asked her to call me, Villa Herb, RN after she gets that information at [phone].

## 2019-03-17 NOTE — Telephone Encounter (Signed)
Instructions for coumadin and Lovenox pre and post procedure.  Tuesday, 7/14 - Last dose of coumadin until after procedure Wednesday, 7/15 - Nothing (No coumadin and No Lovenox) Thursday, 7/16 - Lovenox in the AM (Please take by 7 am) Friday, 7/17 - Procedure (NO LOVENOX TODAY) Saturday, 7/18 - Lovenox in the AM and 2 tablets of coumadin Sunday, 7/19 - Lovenox and 1 1/2 tablets of coumadin Monday, 7/20 - Lovenox and 1 1/2 tablets of coumadin Tuesday - Re-check INR

## 2019-03-18 ENCOUNTER — Other Ambulatory Visit: Payer: Self-pay

## 2019-03-18 ENCOUNTER — Ambulatory Visit (INDEPENDENT_AMBULATORY_CARE_PROVIDER_SITE_OTHER): Payer: Medicare Other | Admitting: General Practice

## 2019-03-18 DIAGNOSIS — Z7901 Long term (current) use of anticoagulants: Secondary | ICD-10-CM | POA: Diagnosis not present

## 2019-03-18 LAB — POCT INR: INR: 3.1 — AB (ref 2.0–3.0)

## 2019-03-18 NOTE — Patient Instructions (Addendum)
Pre visit review using our clinic review tool, if applicable. No additional management support is needed unless otherwise documented below in the visit note.  Hold today and then follow patient instructions.  Re-check on 7/21.  Instructions for coumadin and Lovenox pre and post procedure.  Tuesday, 7/14 - Last dose of coumadin until after procedure Wednesday, 7/15 - Nothing (No coumadin and No Lovenox) Thursday, 7/16 - Lovenox in the AM (Please take by 7 am) Friday, 7/17 - Procedure (NO LOVENOX TODAY) Saturday, 7/18 - Lovenox in the AM and 2 tablets of coumadin Sunday, 7/19 - Lovenox and 1 1/2 tablets of coumadin Monday, 7/20 - Lovenox and 1 1/2 tablets of coumadin Tuesday - Re-check INR

## 2019-03-19 ENCOUNTER — Encounter (HOSPITAL_COMMUNITY): Payer: Self-pay | Admitting: Cardiology

## 2019-03-19 ENCOUNTER — Ambulatory Visit (HOSPITAL_COMMUNITY)
Admission: RE | Admit: 2019-03-19 | Discharge: 2019-03-19 | Disposition: A | Discharge status: Home / Self Care | Payer: Medicare Other | Source: Ambulatory Visit | Attending: Cardiology | Admitting: Cardiology

## 2019-03-19 ENCOUNTER — Ambulatory Visit (HOSPITAL_BASED_OUTPATIENT_CLINIC_OR_DEPARTMENT_OTHER)
Admission: RE | Admit: 2019-03-19 | Discharge: 2019-03-19 | Disposition: A | Discharge status: Home / Self Care | Payer: Medicare Other | Source: Ambulatory Visit | Attending: Cardiology | Admitting: Cardiology

## 2019-03-19 VITALS — BP 173/84 | HR 73 | Wt 144.4 lb

## 2019-03-19 DIAGNOSIS — Z952 Presence of prosthetic heart valve: Secondary | ICD-10-CM | POA: Diagnosis not present

## 2019-03-19 DIAGNOSIS — I428 Other cardiomyopathies: Secondary | ICD-10-CM | POA: Diagnosis not present

## 2019-03-19 DIAGNOSIS — I2510 Atherosclerotic heart disease of native coronary artery without angina pectoris: Secondary | ICD-10-CM

## 2019-03-19 DIAGNOSIS — I340 Nonrheumatic mitral (valve) insufficiency: Secondary | ICD-10-CM | POA: Insufficient documentation

## 2019-03-19 DIAGNOSIS — I4891 Unspecified atrial fibrillation: Secondary | ICD-10-CM | POA: Insufficient documentation

## 2019-03-19 DIAGNOSIS — Z794 Long term (current) use of insulin: Secondary | ICD-10-CM | POA: Diagnosis not present

## 2019-03-19 DIAGNOSIS — I272 Pulmonary hypertension, unspecified: Secondary | ICD-10-CM | POA: Insufficient documentation

## 2019-03-19 DIAGNOSIS — J449 Chronic obstructive pulmonary disease, unspecified: Secondary | ICD-10-CM | POA: Diagnosis not present

## 2019-03-19 DIAGNOSIS — Z7901 Long term (current) use of anticoagulants: Secondary | ICD-10-CM | POA: Diagnosis not present

## 2019-03-19 DIAGNOSIS — I110 Hypertensive heart disease with heart failure: Secondary | ICD-10-CM | POA: Diagnosis not present

## 2019-03-19 DIAGNOSIS — I5022 Chronic systolic (congestive) heart failure: Secondary | ICD-10-CM | POA: Insufficient documentation

## 2019-03-19 DIAGNOSIS — I4821 Permanent atrial fibrillation: Secondary | ICD-10-CM | POA: Diagnosis not present

## 2019-03-19 DIAGNOSIS — Z950 Presence of cardiac pacemaker: Secondary | ICD-10-CM | POA: Insufficient documentation

## 2019-03-19 DIAGNOSIS — Z79899 Other long term (current) drug therapy: Secondary | ICD-10-CM | POA: Diagnosis not present

## 2019-03-19 DIAGNOSIS — F329 Major depressive disorder, single episode, unspecified: Secondary | ICD-10-CM | POA: Insufficient documentation

## 2019-03-19 DIAGNOSIS — I442 Atrioventricular block, complete: Secondary | ICD-10-CM | POA: Diagnosis not present

## 2019-03-19 DIAGNOSIS — Z8249 Family history of ischemic heart disease and other diseases of the circulatory system: Secondary | ICD-10-CM | POA: Insufficient documentation

## 2019-03-19 DIAGNOSIS — E119 Type 2 diabetes mellitus without complications: Secondary | ICD-10-CM | POA: Insufficient documentation

## 2019-03-19 MED ORDER — CARVEDILOL 12.5 MG PO TABS: 12.5000 mg | ORAL_TABLET | Freq: Two times a day (BID) | ORAL | 3 refills | Status: DC

## 2019-03-19 NOTE — Progress Notes (Signed)
Advanced Heart Failure Clinic Note    Primary Care: Dr. Billey Gosling Primary Cardiologist: Dr. Aundra Dubin EP: Dr. Rayann Heman   HPI: Anita Duke is a 78 y.o. with a past medical history of pulmonary HTN, mitral regurgitation s/p mitral valve repair, MAZE procedure at the [facility] in 2001, at that time she also had a LIMA to LAD graft placed. Also with history of permanent Afib on warfarin s/p AVN ablation. At one time she was on Tikosyn which was stopped for QT prolongation.  She has chronic systolic CHF (EF 03% in 01/4655).  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 Last cath in 2014 with atretic LIMA to LAD but nonobstructive disease in LAD.   Echo in 10/18 showed EF improved to 55-60% with mild to moderate RV dysfunction.   Echo was done today and showed EF 50% with basal inferior and inferolateral akinesis, mildly decreased RV systolic function, s/p MV repair with trivial MR, no MS, normal IVC.   Patient returns for followup of CHF.  She is no longer taking Lasix.  BP is high today but she has not had her morning meds.  SBP generally is in the 140s.  She is short of breath only when carrying a load like groceries. No dyspnea walking her dog or walking around stores.  No chest pain.  No orthopnea/PND.   Labs (5/18): K 4.7, creatinine 0.62, LDL 71, HDL 61 Labs (10/18): K 3.5, creatinine 0.59, TSH normal Labs (7/20): K 3.9, creatinine 0.69, hgb 15.1, LDL 37, TSH normal  St Jude device interrogation (personally reviewed): Corevue shows stable thoracic impedance.  98% BiV pacing.   ROS: All systems reviewed and negative except as per HPI.  PMH: 1. Type II diabetes. 2. COPD 3. Depression 4. H/o colon cancer 5. CAD: LIMA-LAD in 4/01 with MV repair.   - LHC (8/14): LIMA-LAD atretic, 50-60% mid LAD stenosis.  6. Mitral regurgitation: S/p MV repair at the [facility] in 4/01.  She also had LAA ligation and MAZE. Echo in 7/20 with trivial MR and no MS.  7. Chronic systolic CHF: Suspect  nonischemic cardiomyopathy.   - St Jude CRT-P device.  - Echo (4/16): EF 40%, mild LV dilation, normal RV size with mild to moderately decreased systolic function, s/p mitral valve repair with mild MR, no significant stenosis.  - Echo (10/18): EF 55-60%, severe LAE, RV with mild to moderate systolic dysfunction, mild-moderate TR, PASP 35 mmHg.  - Echo (7/20): EF 50% with basal inferior and inferolateral akinesis, mildly decreased RV systolic function, s/p MV repair with trivial MR, no MS, normal IVC.  8. Permanent atrial fibrillation: S/p MAZE in 4/01.  S/p AV nodal ablation in 9/13.   9. PFTs (1/19): No significant obstruction or restriction in the lungs.    Current Outpatient Medications  Medication Sig Dispense Refill  . ACCU-CHEK SOFTCLIX LANCETS lancets USE TO CHECK BLOOD SUGARS  TWICE A DAY 200 each 2  . acetaminophen (TYLENOL) 325 MG tablet Take 650 mg by mouth every 6 (six) hours as needed (pain).     Marland Kitchen amoxicillin (AMOXIL) 500 MG capsule Take 4 tabs prior to dental appointments 30 capsule 0  . atorvastatin (LIPITOR) 40 MG tablet TAKE 1 TABLET BY MOUTH  DAILY (Patient taking differently: Take 40 mg by mouth daily. ) 90 tablet 3  . Blood Glucose Monitoring Suppl (ACCU-CHEK AVIVA PLUS) w/Device KIT Use to check blood sugars daily Dx E11.9 1 kit 0  . buPROPion (WELLBUTRIN XL) 300  MG 24 hr tablet Take 1 tablet (300 mg total) by mouth daily. 90 tablet 1  . carvedilol (COREG) 12.5 MG tablet Take 1 tablet (12.5 mg total) by mouth 2 (two) times daily with a meal. 180 tablet 3  . cholecalciferol (VITAMIN D) 1000 UNITS tablet Take 6,000 Units by mouth daily.     Marland Kitchen enoxaparin (LOVENOX) 100 MG/ML injection Inject 1 mL (100 mg total) into the skin daily. 5 mL 0  . fish oil-omega-3 fatty acids 1000 MG capsule Take 1 g by mouth 2 (two) times daily.     Marland Kitchen glucose blood (ACCU-CHEK AVIVA PLUS) test strip USE 2 TIMES DAILY AS  DIRECTED 200 each 2  . JARDIANCE 25 MG TABS tablet TAKE 1 TABLET BY MOUTH   DAILY. 90 tablet 1  . loratadine (CLARITIN) 10 MG tablet Take 10 mg by mouth daily.    Marland Kitchen spironolactone (ALDACTONE) 25 MG tablet TAKE 1 TABLET BY MOUTH  DAILY 90 tablet 1  . trandolapril (MAVIK) 4 MG tablet TAKE 1 TABLET BY MOUTH TWO  TIMES DAILY FOR BLOOD  PRESSURE 180 tablet 1  . warfarin (COUMADIN) 2.5 MG tablet Take 1 tablet daily or TAKE AS DIRECTED BY  ANTICOAGULATION CLINIC 90 day 100 tablet 1   No current facility-administered medications for this encounter.     Allergies  Allergen Reactions  . Demerol [Meperidine] Nausea And Vomiting  . Morphine Nausea And Vomiting  . Januvia [Sitagliptin] Other (See Comments)    Headaches, did not feel well  . Metformin And Related Diarrhea  . Tetanus Toxoid Rash and Other (See Comments)    "years ago"      Social History   Socioeconomic History  . Marital status: Widowed    Spouse name: Not on file  . Number of children: 0  . Years of education: Not on file  . Highest education level: Not on file  Occupational History  . 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 Financial resource strain: Not on file  . Food insecurity    Worry: Not on file    Inability: Not on file  . Transportation needs    Medical: Not on file    Non-medical: Not on file  Tobacco Use  . Smoking status: Never Smoker  . Smokeless tobacco: Never Used  Substance and Sexual Activity  . Alcohol use: No  . Drug use: No  . Sexual activity: Never    Birth control/protection: Surgical  Lifestyle  . Physical activity    Days per week: Not on file    Minutes per session: Not on file  . Stress: Not on file  Relationships  . Social Herbalist on phone: Not on file    Gets together: Not on file    Attends religious service: Not on file    Active member of club or organization: Not on file    Attends meetings of clubs or organizations: Not on file     Relationship status: Not on file  . Intimate partner violence    Fear of current or ex partner: Not on file    Emotionally abused: Not on file    Physically abused: Not on file    Forced sexual activity: Not on file  Other Topics Concern  . Not on file  Social History Narrative         Family  History  Problem Relation Age of Onset  . Heart disease Father   . Diabetes Father   . Heart disease Sister        x 2  . Diabetes Mother   . Kidney disease Mother   . Thyroid disease Mother   . Pancreatic cancer Maternal Grandfather   . Diabetes Paternal Grandfather   . Diabetes Paternal Grandmother   . Heart disease Paternal Uncle        x 6  . Prostate cancer Paternal Uncle   . Colon cancer Neg Hx   . Colon polyps Neg Hx   . Adrenal disorder Neg Hx     Vitals:   03/19/19 1212  BP: (!) 173/84  Pulse: 73  SpO2: 100%  Weight: 65.5 kg (144 lb 6.4 oz)     PHYSICAL EXAM: General: NAD Neck: No JVD, no thyromegaly or thyroid nodule.  Lungs: Clear to auscultation bilaterally with normal respiratory effort. CV: Nondisplaced PMI.  Heart regular S1/S2, no S3/S4, no murmur.  No peripheral edema.  No carotid bruit.  Normal pedal pulses.  Abdomen: Soft, nontender, no hepatosplenomegaly, no distention.  Skin: Intact without lesions or rashes.  Neurologic: Alert and oriented x 3.  Psych: Normal affect. Extremities: No clubbing or cyanosis.  HEENT: Normal.   ASSESSMENT & PLAN: 1. Chronic systolic CHF: EF 61% in 4709. NICM most likely.  She has LIMA-LAD that is atretic, but no flow limiting disease in the LAD.  Cardiomyopathy may be due to RV pacing after AV nodal ablation in 9/13. She now has St Jude CRT-P device and EF was up to 55-60% on 10/18 echo. Echo was done today and reviewed, EF about 50%.  NYHA class II symptoms.  She is not volume overloaded on exam or by Corevue.    - Increase Coreg to 12.5 mg bid with elevated BP.  - Continue trandolapril 4 mg bid.  - She is doing fine off  Lasix, not volume overloaded.   - Continue spironolactone 25 mg daily.    2. CAD: LIMA-LAD at time of MV surgery in 4/01, but LIMA-LAD atretic on 8/14 cath.  However, at that time her LAD had moderate stenosis that did not appear flow-limiting (50-60%).  No chest pain.  - Continue atorvastatin 40 mg daily. Good LDL in 7/20. - No ASA given stable CAD with use of warfarin 3. Complete heart block: She had an AV nodal ablation.  Now s/p CRT-P.   4. Permanent atrial fibrillation: Now s/p AV nodal ablation and BiV pacing. - Continue warfarin for anticoagulation.  5. DM: Continue Jardiance.  6. S/p MV repair: Stable mitral valve repair on 7/20 echo.    Followup in 6 months but will need BMET in 3 months.   Loralie Champagne, MD 03/19/19

## 2019-03-19 NOTE — Patient Instructions (Signed)
INCREASE Coreg to 12.5 mg (1 tab) twice a day.  Your physician wants you to follow-up in: 6 months. You will receive a reminder letter in the mail two months in advance. If you don't receive a letter, please call our office to schedule the follow-up appointment.  At the [facility], you and your health needs are our priority. As part of our continuing mission to provide you with exceptional heart care, we have created designated Provider Care Teams. These Care Teams include your primary Cardiologist (physician) and Advanced Practice Providers (APPs- Physician Assistants and Nurse Practitioners) who all work together to provide you with the care you need, when you need it.   You may see any of the following providers on your designated Care Team at your next follow up: Marland Kitchen Dr Glori Bickers . Dr Loralie Champagne . Darrick Grinder, NP

## 2019-03-19 NOTE — Progress Notes (Signed)
  Echocardiogram 2D Echocardiogram has been performed.  Dailynn Nancarrow L Androw 03/19/2019, 11:46 AM

## 2019-03-20 ENCOUNTER — Encounter: Payer: Self-pay | Admitting: Internal Medicine

## 2019-03-20 ENCOUNTER — Other Ambulatory Visit: Payer: Self-pay

## 2019-03-20 NOTE — Patient Outreach (Signed)
[facility]) Care Management  03/20/2019  Anita Duke 27-Feb-1941 [id number]   Medication Adherence call to Anita Duke HIPPA Compliant Voice message left with a call back number. Anita Duke is showing past due on Trandolapril 4 mg under [place].   [place] Management Direct Dial [phone]  Fax [phone] [email or web address]

## 2019-03-21 ENCOUNTER — Ambulatory Visit
Admission: RE | Admit: 2019-03-21 | Discharge: 2019-03-21 | Disposition: A | Discharge status: Home / Self Care | Payer: Medicare Other | Source: Ambulatory Visit | Attending: Internal Medicine | Admitting: Internal Medicine

## 2019-03-21 ENCOUNTER — Other Ambulatory Visit: Payer: Self-pay

## 2019-03-21 DIAGNOSIS — N6012 Diffuse cystic mastopathy of left breast: Secondary | ICD-10-CM | POA: Diagnosis not present

## 2019-03-21 DIAGNOSIS — R921 Mammographic calcification found on diagnostic imaging of breast: Secondary | ICD-10-CM

## 2019-03-25 ENCOUNTER — Ambulatory Visit (INDEPENDENT_AMBULATORY_CARE_PROVIDER_SITE_OTHER): Payer: Medicare Other | Admitting: General Practice

## 2019-03-25 ENCOUNTER — Other Ambulatory Visit: Payer: Self-pay

## 2019-03-25 DIAGNOSIS — Z7901 Long term (current) use of anticoagulants: Secondary | ICD-10-CM

## 2019-03-25 DIAGNOSIS — I4891 Unspecified atrial fibrillation: Secondary | ICD-10-CM

## 2019-03-25 LAB — POCT INR: INR: 1.5 — AB (ref 2.0–3.0)

## 2019-03-25 NOTE — Patient Instructions (Addendum)
Pre visit review using our clinic review tool, if applicable. No additional management support is needed unless otherwise documented below in the visit note.  Take 2 tablets today (7/21) and tomorrow (7/22) and then resume taking 1 tablet daily except 1 1/2 tablets on Mon Wed and Fridays.  Re-check in 2 weeks.  Take the last Lovenox dosage today.

## 2019-03-31 ENCOUNTER — Ambulatory Visit (INDEPENDENT_AMBULATORY_CARE_PROVIDER_SITE_OTHER): Payer: Medicare Other | Admitting: *Deleted

## 2019-03-31 DIAGNOSIS — I495 Sick sinus syndrome: Secondary | ICD-10-CM

## 2019-04-01 ENCOUNTER — Ambulatory Visit (INDEPENDENT_AMBULATORY_CARE_PROVIDER_SITE_OTHER): Payer: Medicare Other

## 2019-04-01 DIAGNOSIS — I5022 Chronic systolic (congestive) heart failure: Secondary | ICD-10-CM | POA: Diagnosis not present

## 2019-04-01 DIAGNOSIS — Z950 Presence of cardiac pacemaker: Secondary | ICD-10-CM | POA: Diagnosis not present

## 2019-04-01 LAB — CUP PACEART REMOTE DEVICE CHECK
Battery Remaining Longevity: 85 mo
Battery Remaining Percentage: 89 %
Battery Voltage: 2.95 V
Date Time Interrogation Session: 20200727094811
Implantable Lead Implant Date: 20130913
Implantable Lead Implant Date: 20130913
Implantable Lead Implant Date: 20150303
Implantable Lead Location: 753858
Implantable Lead Location: 753859
Implantable Lead Location: 753860
Implantable Lead Model: 5076
Implantable Lead Model: 5092
Implantable Pulse Generator Implant Date: 20150303
Lead Channel Impedance Value: 460 Ohm
Lead Channel Impedance Value: 460 Ohm
Lead Channel Pacing Threshold Amplitude: 0.75 V
Lead Channel Pacing Threshold Amplitude: 0.875 V
Lead Channel Pacing Threshold Pulse Width: 0.4 ms
Lead Channel Pacing Threshold Pulse Width: 0.6 ms
Lead Channel Sensing Intrinsic Amplitude: 8.6 mV
Lead Channel Setting Pacing Amplitude: 2 V
Lead Channel Setting Pacing Amplitude: 2 V
Lead Channel Setting Pacing Pulse Width: 0.4 ms
Lead Channel Setting Pacing Pulse Width: 0.6 ms
Lead Channel Setting Sensing Sensitivity: 3 mV
Pulse Gen Model: 3242
Pulse Gen Serial Number: 7548835

## 2019-04-04 ENCOUNTER — Telehealth: Payer: Self-pay | Admitting: Internal Medicine

## 2019-04-04 ENCOUNTER — Telehealth: Payer: Self-pay

## 2019-04-04 DIAGNOSIS — N6320 Unspecified lump in the left breast, unspecified quadrant: Secondary | ICD-10-CM

## 2019-04-04 NOTE — Telephone Encounter (Signed)
Patient returned call--accidentally hung up when answering Laurie's call earlier. Requesting a call back.

## 2019-04-04 NOTE — Progress Notes (Signed)
EPIC Encounter for ICM Monitoring  Patient Name: Anita Duke is a 78 y.o. female Date: 04/04/2019 Primary Care Physican: Binnie Rail, MD Primary Care Physican: Binnie Rail, MD Primary Cardiologist:Varanasi/McLean Electrophysiologist: Allred Bi-V Pacing: 99% Weight:unknown  Attempted call to patient and unable to reach.  Transmission reviewed.   Corvue Thoracic impedancenormal.  Takes Spironolactone25mg  take 1 tablet daily  Labs: 03-05-2019 Creatinine 0.64, BUN 17, Potassium 3.9, Sodium 141, GFR 89.48 A complete set of results can be found in Results Review.  Recommendations:Unable to reach.    Follow-up plan: ICM clinic phone appointment on 05/13/2019.  Copy of ICM check sent to Dr. Rayann Heman.   3 month ICM trend: 03/31/2019    1 Year ICM trend:       [name], RN 04/04/2019 3:58 PM

## 2019-04-04 NOTE — Telephone Encounter (Signed)
Between 6-7

## 2019-04-04 NOTE — Telephone Encounter (Signed)
ordered

## 2019-04-04 NOTE — Telephone Encounter (Signed)
Remote ICM transmission received.  Attempted call to patient regarding ICM remote transmission and phone disconnected after picked up.

## 2019-04-04 NOTE — Telephone Encounter (Signed)
Pt went to breast center on 03/21/2019 and had left breast needle  biopsy. Pt has develop a lump on left breast and breast center is recommending diagnostic mammogram and ultrasound

## 2019-04-04 NOTE — Telephone Encounter (Signed)
Where is the lump?

## 2019-04-07 ENCOUNTER — Other Ambulatory Visit: Payer: Self-pay | Admitting: Internal Medicine

## 2019-04-08 ENCOUNTER — Ambulatory Visit: Payer: Medicare Other | Admitting: General Practice

## 2019-04-08 ENCOUNTER — Other Ambulatory Visit: Payer: Self-pay

## 2019-04-08 DIAGNOSIS — I4821 Permanent atrial fibrillation: Secondary | ICD-10-CM

## 2019-04-08 DIAGNOSIS — Z7901 Long term (current) use of anticoagulants: Secondary | ICD-10-CM

## 2019-04-08 LAB — POCT INR: INR: 2.3 (ref 2.0–3.0)

## 2019-04-08 NOTE — Patient Instructions (Signed)
Pre visit review using our clinic review tool, if applicable. No additional management support is needed unless otherwise documented below in the visit note.  Continue to take 1 tablet daily except 1 1/2 tablets on Mon Wed and Fridays.  Re-check in 4 weeks.

## 2019-04-09 ENCOUNTER — Ambulatory Visit
Admission: RE | Admit: 2019-04-09 | Discharge: 2019-04-09 | Disposition: A | Discharge status: Home / Self Care | Payer: Medicare Other | Source: Ambulatory Visit | Attending: Internal Medicine | Admitting: Internal Medicine

## 2019-04-09 ENCOUNTER — Other Ambulatory Visit (HOSPITAL_COMMUNITY): Payer: Self-pay

## 2019-04-09 DIAGNOSIS — S2002XA Contusion of left breast, initial encounter: Secondary | ICD-10-CM | POA: Diagnosis not present

## 2019-04-09 DIAGNOSIS — R928 Other abnormal and inconclusive findings on diagnostic imaging of breast: Secondary | ICD-10-CM | POA: Diagnosis not present

## 2019-04-09 DIAGNOSIS — N6320 Unspecified lump in the left breast, unspecified quadrant: Secondary | ICD-10-CM

## 2019-04-09 DIAGNOSIS — N6489 Other specified disorders of breast: Secondary | ICD-10-CM | POA: Diagnosis not present

## 2019-04-09 MED ORDER — ATORVASTATIN CALCIUM 40 MG PO TABS: 40.0000 mg | ORAL_TABLET | Freq: Every day | ORAL | 3 refills | Status: DC

## 2019-04-10 ENCOUNTER — Other Ambulatory Visit: Payer: Medicare Other

## 2019-04-18 ENCOUNTER — Encounter: Payer: Self-pay | Admitting: Cardiology

## 2019-04-18 NOTE — Progress Notes (Signed)
Remote pacemaker transmission.   

## 2019-04-28 ENCOUNTER — Telehealth: Payer: Self-pay

## 2019-04-30 NOTE — Telephone Encounter (Signed)
Spoke with pt regarding appt on 05/01/19. Pt stated she is not sure if she can access her MyChart, but will try when she gets home. Pt was advised to read the message with instructions for her MyChart Video Visit. Pt agreed and stated she will call me if she has any questions.

## 2019-05-01 ENCOUNTER — Telehealth: Payer: Self-pay | Admitting: Internal Medicine

## 2019-05-01 ENCOUNTER — Telehealth (INDEPENDENT_AMBULATORY_CARE_PROVIDER_SITE_OTHER): Payer: Medicare Other | Admitting: Internal Medicine

## 2019-05-01 ENCOUNTER — Encounter: Payer: Self-pay | Admitting: Internal Medicine

## 2019-05-01 VITALS — Ht 62.0 in | Wt 140.0 lb

## 2019-05-01 DIAGNOSIS — I5022 Chronic systolic (congestive) heart failure: Secondary | ICD-10-CM | POA: Diagnosis not present

## 2019-05-01 DIAGNOSIS — I442 Atrioventricular block, complete: Secondary | ICD-10-CM

## 2019-05-01 DIAGNOSIS — I4821 Permanent atrial fibrillation: Secondary | ICD-10-CM

## 2019-05-01 NOTE — Telephone Encounter (Signed)
Anita Duke with [facility] is calling to ask some billing questions- She was advised to contact billing and stated that she was referred back to the practice.  States the patient had a mammogram 03/13/19 and was called to a diagnotic mamogram 03/21/19.  Patient charged $12.48.  Wanted to know why it changed from Regular Breast Screening mammogram to a diagnotic mammogram?  Please advise with the Patient CB- [phone]

## 2019-05-01 NOTE — Progress Notes (Signed)
Electrophysiology TeleHealth Note  Due to national recommendations of social distancing due to [place] 19, an audio telehealth visit is felt to be most appropriate for this patient at this time.  Verbal consent was obtained by me for the telehealth visit today.  The patient does not have capability for a virtual visit.  A phone visit is therefore required today.   Date:  05/01/2019   ID:  Anita Duke, DOB 1940/10/17, MRN [id number]  Location: patient's home  Provider location:  [facility]  Evaluation Performed: Follow-up visit  PCP:  Binnie Rail, MD   Electrophysiologist:  Dr Rayann Heman  Chief Complaint:  palpitations  History of Present Illness:    Anita Duke is a 78 y.o. female who presents via telehealth conferencing today.  Since last being seen in our clinic, the patient reports doing very well.  Today, she denies symptoms of palpitations, chest pain, shortness of breath,  lower extremity edema, dizziness, presyncope, or syncope.  She now lives in a retirement facility.  She walks her dog several times per day. The patient is otherwise without complaint today.  The patient denies symptoms of fevers, chills, cough, or new SOB worrisome for COVID 19.  Past Medical History:  Diagnosis Date  . Anemia    Hemoglobin 10.4, December, 2013  . Arthritis    "not bad; little in my hands; some in my knees" (11/04/2013)  . Asthma   . CAD (coronary artery disease)    LIMA to the LAD at time of mitral valve repair / LIMA atretic,, February, 2011  . Cataracts, bilateral   . 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 CHF (congestive heart failure) ([place])   . Colon polyp, hyperplastic   . Complication of anesthesia   . Depression   . Diverticulosis of colon   . GERD (gastroesophageal reflux disease)   . Hemorrhoids, internal   . High cholesterol   . HTN (hypertension)   . Mitral regurgitation    a. s/p repair with LAA ligation and MAZE at time of surgery  . Permanent  atrial fibrillation    a. s/p MAZE b. s/p AVN ablation  . PONV (postoperative nausea and vomiting)   . Presence of permanent cardiac pacemaker   . Pulmonary HTN ([place])   . QT prolongation    Tikosyn and Effexor. QT prolonged October 13, 2011, peak is in dose reduced from 500  to -250 twice a day  . Type II diabetes mellitus ([place])     Past Surgical History:  Procedure Laterality Date  . ABDOMINAL HYSTERECTOMY  ?2002  . AV NODE ABLATION N/A 05/28/2012   Procedure: AV NODE ABLATION;  Surgeon: Thompson Grayer, MD;  Location: [facility] CATH LAB;  Service: Cardiovascular;  Laterality: N/A;  . BI-VENTRICULAR PACEMAKER UPGRADE N/A 11/04/2013   upgrade of previously implanted dual chamber pacemaker to STJ Quadra Allure CRTP by Dr Rayann Heman  . BREAST EXCISIONAL BIOPSY Left over 10 years ago   benign  . BREAST SURGERY    . CARDIOVERSION  02/08/2012   Procedure: CARDIOVERSION;  Surgeon: Carlena Bjornstad, MD;  Location: [place];  Service: Cardiovascular;  Laterality: N/A;  . CARDIOVERSION  02/29/2012   Procedure: CARDIOVERSION;  Surgeon: Carlena Bjornstad, MD;  Location: [place];  Service: Cardiovascular;  Laterality: N/A;  . CARDIOVERSION  05/15/2012   Procedure: CARDIOVERSION;  Surgeon: Carlena Bjornstad, MD;  Location: [place];  Service: Cardiovascular;  Laterality: N/A;  . CATARACT EXTRACTION, BILATERAL  2017  . CHOLECYSTECTOMY  1995  . CORONARY ARTERY BYPASS GRAFT  2001   CABG X1 "at time of mitral valve repair" (12/04/2012  . DIAGNOSTIC LAPAROSCOPIC LIVER BIOPSY Left 12/03/2012   Procedure: DIAGNOSTIC LAPAROSCOPIC LIVER BIOPSY;  Surgeon: Stark Klein, MD;  Location: [place];  Service: General;  Laterality: Left;  . EYE SURGERY     bilateral cataract surgery  . LAPAROSCOPIC RIGHT HEMI COLECTOMY  12/03/2012   Procedure: LAPAROSCOPIC RIGHT HEMI COLECTOMY;  Surgeon: Stark Klein, MD;  Location: [place];  Service: General;;  . LUMBAR LAMINECTOMY/ DECOMPRESSION WITH MET-RX Left 06/17/2018   Procedure: left Lumbar  three-fourextraforaminal Microdiscectomy with Met-Rx;  Surgeon: Kristeen Miss, MD;  Location: [place];  Service: Neurosurgery;  Laterality: Left;  Marland Kitchen MAZE  2001   w/ MVR & CABG  . MITRAL VALVE REPAIR  2001   "anterior and posterior leaflets" (12/04/2012)  . 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 TONSILLECTOMY AND ADENOIDECTOMY  ~ 1950  . UMBILICAL HERNIA REPAIR N/A 12/03/2012   Procedure: HERNIA REPAIR UMBILICAL ADULT;  Surgeon: Stark Klein, MD;  Location: [facility];  Service: General;  Laterality: N/A;    Current Outpatient Medications  Medication Sig Dispense Refill  . ACCU-CHEK SOFTCLIX LANCETS lancets USE TO CHECK BLOOD SUGARS  TWICE A DAY 200 each 2  . acetaminophen (TYLENOL) 325 MG tablet Take 650 mg by mouth every 6 (six) hours as needed (pain).     Marland Kitchen amoxicillin (AMOXIL) 500 MG capsule Take 4 tabs prior to dental appointments 30 capsule 0  . atorvastatin (LIPITOR) 40 MG tablet Take 1 tablet (40 mg total) by mouth daily. 90 tablet 3  . Blood Glucose Monitoring Suppl (ACCU-CHEK AVIVA PLUS) w/Device KIT Use to check blood sugars daily Dx E11.9 1 kit 0  . buPROPion (WELLBUTRIN XL) 300 MG 24 hr tablet Take 1 tablet (300 mg total) by mouth daily. 90 tablet 1  . carvedilol (COREG) 12.5 MG tablet Take 1 tablet (12.5 mg total) by mouth 2 (two) times daily with a meal. 180 tablet 3  . cholecalciferol (VITAMIN D) 1000 UNITS tablet Take 6,000 Units by mouth daily.     . fish oil-omega-3 fatty acids 1000 MG capsule Take 1 g by mouth 2 (two) times daily.     Marland Kitchen glucose blood (ACCU-CHEK AVIVA PLUS) test strip USE 2 TIMES DAILY AS  DIRECTED 200 each 2  . JARDIANCE 25 MG TABS tablet TAKE 1 TABLET BY MOUTH  DAILY. 90 tablet 1  . loratadine (CLARITIN) 10 MG tablet Take 10 mg by mouth daily.    Marland Kitchen spironolactone (ALDACTONE) 25 MG tablet TAKE 1 TABLET BY MOUTH  DAILY 90 tablet 1  . trandolapril (MAVIK) 4 MG tablet TAKE 1 TABLET BY MOUTH TWO   TIMES DAILY FOR BLOOD  PRESSURE 180 tablet 1  . warfarin (COUMADIN) 2.5 MG tablet Take 1 tablet daily or TAKE AS DIRECTED BY  ANTICOAGULATION CLINIC 90 day 100 tablet 1   No current facility-administered medications for this visit.     Allergies:   Demerol [meperidine], Morphine, Januvia [sitagliptin], Metformin and related, and Tetanus toxoid   Social History:  The patient  reports that she has never smoked. She has never used smokeless tobacco. She reports that she does not drink alcohol or use drugs.   Family History:  The patient's  family history includes Diabetes in her father, mother, paternal grandfather, and paternal grandmother; Heart disease in  her father, paternal uncle, and sister; Kidney disease in her mother; Pancreatic cancer in her maternal grandfather; Prostate cancer in her paternal uncle; Thyroid disease in her mother.   ROS:  Please see the history of present illness.   All other systems are personally reviewed and negative.    Exam:    Vital Signs:  Ht 5' 2"  (1.575 m)   Wt 140 lb (63.5 kg)   BMI 25.61 kg/m   Well sounding    Labs/Other Tests and Data Reviewed:    Recent Labs: 03/05/2019: ALT 16; BUN 17; Creatinine, Ser 0.64; Hemoglobin 15.1; Platelets 190.0; Potassium 3.9; Sodium 141; TSH 0.82   Wt Readings from Last 3 Encounters:  05/01/19 140 lb (63.5 kg)  03/19/19 144 lb 6.4 oz (65.5 kg)  03/05/19 143 lb 12.8 oz (65.2 kg)    Echo 03/19/2019- EF 50%, severe biatrial enlargent  Last device remote is reviewed from [facility] PDF which reveals normal device function, no arrhythmias    ASSESSMENT & PLAN:    1.  Chronic systolic dysfunction Doing well EF has recovered with CRT Followed by Sharman Cheek in [facility] clinic  2. Complete heart block Device remotes are uptodate Continue to follow remotely  3. Permanent afib Rate controlled s/p AV nodal ablation On coumadin for stroke prevention (chads2vasc score is 5)  4. HTN Stable No change required today   5. CAD No ischemic symptoms   Follow-up:  Remote monitoring Return to see EP NP In a year  Patient Risk:  after full review of this patients clinical status, I feel that they are at moderate risk at this time.  Today, I have spent 15 minutes with the patient with telehealth technology discussing arrhythmia management .    Army Fossa, MD  05/01/2019 12:01 PM     [address] [place] [address] [phone] (office) [phone] (fax)

## 2019-05-02 NOTE — Telephone Encounter (Signed)
Spoke with patient.  It was not a good time for her to speak.  She is going to call back in regard.

## 2019-05-02 NOTE — Telephone Encounter (Signed)
[facility].

## 2019-05-06 ENCOUNTER — Ambulatory Visit (INDEPENDENT_AMBULATORY_CARE_PROVIDER_SITE_OTHER): Payer: Medicare Other | Admitting: General Practice

## 2019-05-06 ENCOUNTER — Other Ambulatory Visit: Payer: Self-pay

## 2019-05-06 DIAGNOSIS — Z7901 Long term (current) use of anticoagulants: Secondary | ICD-10-CM | POA: Diagnosis not present

## 2019-05-06 DIAGNOSIS — I4891 Unspecified atrial fibrillation: Secondary | ICD-10-CM

## 2019-05-06 LAB — POCT INR: INR: 2 (ref 2.0–3.0)

## 2019-05-06 NOTE — Progress Notes (Signed)
Agree with management.  Winfred Iiams J Mirian Casco, MD  

## 2019-05-06 NOTE — Patient Instructions (Addendum)
Pre visit review using our clinic review tool, if applicable. No additional management support is needed unless otherwise documented below in the visit note.  Continue to take 1 tablet daily except 1 1/2 tablets on Mon Wed and Fridays. Re-check in 4 weeks. 

## 2019-05-13 ENCOUNTER — Ambulatory Visit (INDEPENDENT_AMBULATORY_CARE_PROVIDER_SITE_OTHER): Payer: Medicare Other

## 2019-05-13 DIAGNOSIS — Z950 Presence of cardiac pacemaker: Secondary | ICD-10-CM | POA: Diagnosis not present

## 2019-05-13 DIAGNOSIS — I5022 Chronic systolic (congestive) heart failure: Secondary | ICD-10-CM | POA: Diagnosis not present

## 2019-05-14 ENCOUNTER — Other Ambulatory Visit (HOSPITAL_COMMUNITY): Payer: Self-pay | Admitting: Internal Medicine

## 2019-05-14 NOTE — Progress Notes (Signed)
EPIC Encounter for ICM Monitoring  Patient Name: Anita Duke is a 78 y.o. female Date: 05/14/2019 Primary Care Physican: Binnie Rail, MD Primary Cardiologist:Varanasi/McLean Electrophysiologist: Allred Bi-V Pacing: 99% Weight:unknown  Spoke with patient and she said she is doing well.  CorvueThoracic impedancenormal.  Takes Spironolactone25mg  take 1 tablet daily  Labs: 03/05/2019 Creatinine 0.64, BUN 17, Potassium 3.9, Sodium 141, GFR 89.48 A complete set of results can be found in Results Review.  Recommendations: No changes and encouraged to call if experiencing any fluid symptoms.  Follow-up plan: ICM clinic phone appointment on 07/02/2019.   91 day device clinic remote transmission 07/01/2019.     Copy of ICM check sent to Dr. Rayann Heman.   3 month ICM trend: 05/13/2019    1 Year ICM trend:       [name], RN 05/14/2019 4:54 PM

## 2019-05-19 ENCOUNTER — Other Ambulatory Visit: Payer: Self-pay | Admitting: Internal Medicine

## 2019-05-20 DIAGNOSIS — Z85828 Personal history of other malignant neoplasm of skin: Secondary | ICD-10-CM | POA: Diagnosis not present

## 2019-05-20 DIAGNOSIS — L821 Other seborrheic keratosis: Secondary | ICD-10-CM | POA: Diagnosis not present

## 2019-05-20 DIAGNOSIS — L812 Freckles: Secondary | ICD-10-CM | POA: Diagnosis not present

## 2019-05-20 DIAGNOSIS — L720 Epidermal cyst: Secondary | ICD-10-CM | POA: Diagnosis not present

## 2019-05-20 DIAGNOSIS — D1801 Hemangioma of skin and subcutaneous tissue: Secondary | ICD-10-CM | POA: Diagnosis not present

## 2019-05-21 ENCOUNTER — Other Ambulatory Visit: Payer: Self-pay

## 2019-05-21 MED ORDER — BUPROPION HCL ER (XL) 300 MG PO TB24: 300.0000 mg | ORAL_TABLET | Freq: Every day | ORAL | 1 refills | Status: DC

## 2019-05-28 DIAGNOSIS — M79674 Pain in right toe(s): Secondary | ICD-10-CM | POA: Diagnosis not present

## 2019-05-28 DIAGNOSIS — M79675 Pain in left toe(s): Secondary | ICD-10-CM | POA: Diagnosis not present

## 2019-05-28 DIAGNOSIS — L600 Ingrowing nail: Secondary | ICD-10-CM | POA: Diagnosis not present

## 2019-05-28 DIAGNOSIS — B351 Tinea unguium: Secondary | ICD-10-CM | POA: Diagnosis not present

## 2019-05-28 MED FILL — TERBINAFINE HCL 250 MG TAB: 250 | 30 days supply | Qty: 30 | Fill #0

## 2019-06-03 ENCOUNTER — Ambulatory Visit (INDEPENDENT_AMBULATORY_CARE_PROVIDER_SITE_OTHER): Payer: Medicare Other | Admitting: General Practice

## 2019-06-03 ENCOUNTER — Other Ambulatory Visit: Payer: Self-pay

## 2019-06-03 DIAGNOSIS — Z7901 Long term (current) use of anticoagulants: Secondary | ICD-10-CM | POA: Diagnosis not present

## 2019-06-03 DIAGNOSIS — I4891 Unspecified atrial fibrillation: Secondary | ICD-10-CM

## 2019-06-03 LAB — POCT INR: INR: 3.1 — AB (ref 2.0–3.0)

## 2019-06-03 NOTE — Patient Instructions (Addendum)
Pre visit review using our clinic review tool, if applicable. No additional management support is needed unless otherwise documented below in the visit note.  Skip coumadin today and then continue to take 1 tablet daily except 1 1/2 tablets on Mon Wed and Fridays.  Re-check in 4 weeks.

## 2019-06-03 NOTE — Progress Notes (Signed)
Agree with management.  Stacy J Burns, MD  

## 2019-06-11 DIAGNOSIS — E119 Type 2 diabetes mellitus without complications: Secondary | ICD-10-CM | POA: Diagnosis not present

## 2019-06-11 DIAGNOSIS — Z961 Presence of intraocular lens: Secondary | ICD-10-CM | POA: Diagnosis not present

## 2019-06-11 DIAGNOSIS — H52203 Unspecified astigmatism, bilateral: Secondary | ICD-10-CM | POA: Diagnosis not present

## 2019-06-11 LAB — HM DIABETES EYE EXAM

## 2019-06-24 ENCOUNTER — Other Ambulatory Visit: Payer: Self-pay | Admitting: Internal Medicine

## 2019-06-24 DIAGNOSIS — Z7901 Long term (current) use of anticoagulants: Secondary | ICD-10-CM

## 2019-06-26 ENCOUNTER — Telehealth (HOSPITAL_COMMUNITY): Payer: Self-pay | Admitting: Pharmacist

## 2019-06-26 ENCOUNTER — Telehealth (HOSPITAL_COMMUNITY): Payer: Self-pay

## 2019-06-26 NOTE — Telephone Encounter (Signed)
Spoke with patient about her new Lamisil medication. Informed patient that there were no interactions with her current medications and that she can start therapy. Her PCP will monitor response to therapy.   Audry Riles, PharmD, BCPS, CPP Heart Failure Clinic Pharmacist [phone]

## 2019-06-26 NOTE — Telephone Encounter (Signed)
Correction medication is Lamisil 250mg  daily.  Spoke with patient to clarify. Message sent to pharmacist to se if it is ok for patient to take.  Pt aware of same and will wait for a call back before starting medicine.

## 2019-06-26 NOTE — Telephone Encounter (Signed)
Sherene Sires D states she will give patient a call to discuss

## 2019-06-26 NOTE — Telephone Encounter (Signed)
Pt left message on vm stating that she was prescribed a medicine for a toe fungus called lamifil 250mg  daily.  She states she remembered being told she should not take this medicine because of her heart condition.  She wants to know if its ok to take?

## 2019-06-26 NOTE — Telephone Encounter (Signed)
Suspect really Lamisil? I do not see cardiac side effects listed, but check with Lauren.  Probably ok.

## 2019-07-01 ENCOUNTER — Ambulatory Visit (INDEPENDENT_AMBULATORY_CARE_PROVIDER_SITE_OTHER): Payer: Medicare Other | Admitting: *Deleted

## 2019-07-01 DIAGNOSIS — I495 Sick sinus syndrome: Secondary | ICD-10-CM

## 2019-07-01 DIAGNOSIS — I4891 Unspecified atrial fibrillation: Secondary | ICD-10-CM

## 2019-07-01 LAB — CUP PACEART REMOTE DEVICE CHECK
Battery Remaining Longevity: 82 mo
Battery Remaining Percentage: 89 %
Battery Voltage: 2.95 V
Date Time Interrogation Session: 20201027060012
Implantable Lead Implant Date: 20130913
Implantable Lead Implant Date: 20130913
Implantable Lead Implant Date: 20150303
Implantable Lead Location: 753858
Implantable Lead Location: 753859
Implantable Lead Location: 753860
Implantable Lead Model: 5076
Implantable Lead Model: 5092
Implantable Pulse Generator Implant Date: 20150303
Lead Channel Impedance Value: 390 Ohm
Lead Channel Impedance Value: 460 Ohm
Lead Channel Pacing Threshold Amplitude: 0.875 V
Lead Channel Pacing Threshold Amplitude: 0.875 V
Lead Channel Pacing Threshold Pulse Width: 0.4 ms
Lead Channel Pacing Threshold Pulse Width: 0.6 ms
Lead Channel Sensing Intrinsic Amplitude: 8.5 mV
Lead Channel Setting Pacing Amplitude: 2 V
Lead Channel Setting Pacing Amplitude: 2 V
Lead Channel Setting Pacing Pulse Width: 0.4 ms
Lead Channel Setting Pacing Pulse Width: 0.6 ms
Lead Channel Setting Sensing Sensitivity: 3 mV
Pulse Gen Model: 3242
Pulse Gen Serial Number: 7548835

## 2019-07-02 ENCOUNTER — Ambulatory Visit (INDEPENDENT_AMBULATORY_CARE_PROVIDER_SITE_OTHER): Payer: Medicare Other

## 2019-07-02 DIAGNOSIS — Z950 Presence of cardiac pacemaker: Secondary | ICD-10-CM | POA: Diagnosis not present

## 2019-07-02 DIAGNOSIS — I5022 Chronic systolic (congestive) heart failure: Secondary | ICD-10-CM | POA: Diagnosis not present

## 2019-07-04 ENCOUNTER — Ambulatory Visit (INDEPENDENT_AMBULATORY_CARE_PROVIDER_SITE_OTHER): Payer: Medicare Other | Admitting: General Practice

## 2019-07-04 ENCOUNTER — Other Ambulatory Visit: Payer: Self-pay

## 2019-07-04 DIAGNOSIS — Z7901 Long term (current) use of anticoagulants: Secondary | ICD-10-CM | POA: Diagnosis not present

## 2019-07-04 DIAGNOSIS — I4891 Unspecified atrial fibrillation: Secondary | ICD-10-CM

## 2019-07-04 LAB — POCT INR: INR: 2.7 (ref 2.0–3.0)

## 2019-07-04 NOTE — Progress Notes (Signed)
EPIC Encounter for ICM Monitoring  Patient Name: Anita Duke is a 78 y.o. female Date: 07/04/2019 Primary Care Physican: Binnie Rail, MD Primary Cardiologist:Varanasi/McLean Electrophysiologist: Allred Bi-V Pacing: 99% Weight:unknown  Spoke with patient and she said she is doing well.  CorvueThoracic impedancenormal.  Takes Spironolactone25mg  take 1 tablet daily  Labs: 03/05/2019 Creatinine 0.64, BUN 17, Potassium 3.9, Sodium 141, GFR 89.48 A complete set of results can be found in Results Review.  Recommendations:  Reinforced limiting salt intake to < 2000 mg daily and fluid intake to 64 oz daily.  Encouraged to call if experiencing fluid symptoms.  Follow-up plan: ICM clinic phone appointment on 08/11/2019.   91 day device clinic remote transmission 09/30/2019.    Copy of ICM check sent to Dr. Rayann Heman.   3 month ICM trend: 07/01/2019    1 Year ICM trend:       [name], RN 07/04/2019 11:19 AM

## 2019-07-04 NOTE — Progress Notes (Signed)
Agree with management.  Adama Ferber J Zyler Hyson, MD  

## 2019-07-04 NOTE — Patient Instructions (Addendum)
Pre visit review using our clinic review tool, if applicable. No additional management support is needed unless otherwise documented below in the visit note.  Continue to take 1 tablet daily except 1 1/2 tablets on Mon Wed and Fridays. Re-check in 4 weeks. 

## 2019-07-23 NOTE — Progress Notes (Signed)
Remote pacemaker transmission.   

## 2019-08-08 ENCOUNTER — Ambulatory Visit (INDEPENDENT_AMBULATORY_CARE_PROVIDER_SITE_OTHER): Payer: Medicare Other | Admitting: General Practice

## 2019-08-08 ENCOUNTER — Other Ambulatory Visit: Payer: Self-pay

## 2019-08-08 DIAGNOSIS — Z7901 Long term (current) use of anticoagulants: Secondary | ICD-10-CM | POA: Diagnosis not present

## 2019-08-08 DIAGNOSIS — I4891 Unspecified atrial fibrillation: Secondary | ICD-10-CM

## 2019-08-08 LAB — POCT INR: INR: 3.4 — AB (ref 2.0–3.0)

## 2019-08-08 NOTE — Progress Notes (Signed)
Agree with management.  Devin Foskey J Jadynn Epping, MD  

## 2019-08-08 NOTE — Patient Instructions (Addendum)
Pre visit review using our clinic review tool, if applicable. No additional management support is needed unless otherwise documented below in the visit note.  Skip dosage today (12/4) and then change dosage and take 1 tablet daily except 1 1/2 tablets on Mon and Fridays.  Re-check in 4 weeks.

## 2019-08-11 ENCOUNTER — Ambulatory Visit (INDEPENDENT_AMBULATORY_CARE_PROVIDER_SITE_OTHER): Payer: Medicare Other

## 2019-08-11 DIAGNOSIS — I5022 Chronic systolic (congestive) heart failure: Secondary | ICD-10-CM | POA: Diagnosis not present

## 2019-08-11 DIAGNOSIS — Z950 Presence of cardiac pacemaker: Secondary | ICD-10-CM | POA: Diagnosis not present

## 2019-08-13 NOTE — Progress Notes (Signed)
EPIC Encounter for ICM Monitoring  Patient Name: Anita Duke is a 78 y.o. female Date: 08/13/2019 Primary Care Physican: Binnie Rail, MD Primary Cardiologist:Varanasi/McLean Electrophysiologist: Allred Bi-V Pacing: 99% 08/13/2019 Weight:145 lbs  Spoke with patient and she said she is doing well.  CorvueThoracic impedancenormal.  Takes Spironolactone25mg  take 1 tablet daily  Labs: 03/05/2019 Creatinine 0.64, BUN 17, Potassium 3.9, Sodium 141, GFR 89.48 A complete set of results can be found in Results Review.  Recommendations:  Reinforced limiting salt intake to < 2000 mg daily and fluid intake to 64 oz daily.  Encouraged to call if experiencing fluid symptoms.  Follow-up plan: ICM clinic phone appointment on 09/15/2019.   91 day device clinic remote transmission 09/30/2019. Office visit with Dr Aundra Dubin on 09/09/2019.    Copy of ICM check sent to Dr. Rayann Heman.   3 month ICM trend: 08/11/2019    1 Year ICM trend:       [name], RN 08/13/2019 8:44 AM

## 2019-08-14 ENCOUNTER — Encounter: Payer: Self-pay | Admitting: Internal Medicine

## 2019-08-14 MED ORDER — TRAZODONE HCL 50 MG PO TABS: 25.0000 mg | ORAL_TABLET | Freq: Every evening | ORAL | 3 refills | Status: DC | PRN

## 2019-08-15 MED FILL — traZODone HCL 50 MG TABS: 50 | 30 days supply | Qty: 30 | Fill #0

## 2019-08-22 ENCOUNTER — Telehealth: Payer: Self-pay

## 2019-08-22 NOTE — Telephone Encounter (Signed)
Attempted return call to patient as requested by voice mail message regarding scheduled remote transmissions for January.  Per DPR left message explaining 2 remote transmissions for January.  Advised if she needs the 1/11 transmission to be rescheduled to call back. Left call back number.

## 2019-09-09 ENCOUNTER — Other Ambulatory Visit: Payer: Self-pay

## 2019-09-09 ENCOUNTER — Encounter (HOSPITAL_COMMUNITY): Payer: Self-pay | Admitting: Cardiology

## 2019-09-09 ENCOUNTER — Ambulatory Visit (HOSPITAL_COMMUNITY)
Admission: RE | Admit: 2019-09-09 | Discharge: 2019-09-09 | Disposition: A | Discharge status: Home / Self Care | Payer: Medicare Other | Source: Ambulatory Visit | Attending: Cardiology | Admitting: Cardiology

## 2019-09-09 VITALS — BP 104/52 | HR 70 | Wt 148.0 lb

## 2019-09-09 DIAGNOSIS — I519 Heart disease, unspecified: Secondary | ICD-10-CM

## 2019-09-09 DIAGNOSIS — I5022 Chronic systolic (congestive) heart failure: Secondary | ICD-10-CM | POA: Insufficient documentation

## 2019-09-09 DIAGNOSIS — F329 Major depressive disorder, single episode, unspecified: Secondary | ICD-10-CM | POA: Diagnosis not present

## 2019-09-09 DIAGNOSIS — Z950 Presence of cardiac pacemaker: Secondary | ICD-10-CM | POA: Insufficient documentation

## 2019-09-09 DIAGNOSIS — Z800 Family history of malignant neoplasm of digestive organs: Secondary | ICD-10-CM | POA: Diagnosis not present

## 2019-09-09 DIAGNOSIS — I4821 Permanent atrial fibrillation: Secondary | ICD-10-CM | POA: Diagnosis not present

## 2019-09-09 DIAGNOSIS — Z885 Allergy status to narcotic agent status: Secondary | ICD-10-CM | POA: Diagnosis not present

## 2019-09-09 DIAGNOSIS — Z7901 Long term (current) use of anticoagulants: Secondary | ICD-10-CM | POA: Diagnosis not present

## 2019-09-09 DIAGNOSIS — Z888 Allergy status to other drugs, medicaments and biological substances status: Secondary | ICD-10-CM | POA: Diagnosis not present

## 2019-09-09 DIAGNOSIS — Z792 Long term (current) use of antibiotics: Secondary | ICD-10-CM | POA: Diagnosis not present

## 2019-09-09 DIAGNOSIS — Z887 Allergy status to serum and vaccine status: Secondary | ICD-10-CM | POA: Insufficient documentation

## 2019-09-09 DIAGNOSIS — Z8249 Family history of ischemic heart disease and other diseases of the circulatory system: Secondary | ICD-10-CM | POA: Diagnosis not present

## 2019-09-09 DIAGNOSIS — I442 Atrioventricular block, complete: Secondary | ICD-10-CM | POA: Diagnosis not present

## 2019-09-09 DIAGNOSIS — J449 Chronic obstructive pulmonary disease, unspecified: Secondary | ICD-10-CM | POA: Diagnosis not present

## 2019-09-09 DIAGNOSIS — Z8042 Family history of malignant neoplasm of prostate: Secondary | ICD-10-CM | POA: Diagnosis not present

## 2019-09-09 DIAGNOSIS — Z952 Presence of prosthetic heart valve: Secondary | ICD-10-CM | POA: Diagnosis not present

## 2019-09-09 DIAGNOSIS — Z79899 Other long term (current) drug therapy: Secondary | ICD-10-CM | POA: Diagnosis not present

## 2019-09-09 DIAGNOSIS — I340 Nonrheumatic mitral (valve) insufficiency: Secondary | ICD-10-CM | POA: Insufficient documentation

## 2019-09-09 DIAGNOSIS — Z9889 Other specified postprocedural states: Secondary | ICD-10-CM | POA: Insufficient documentation

## 2019-09-09 DIAGNOSIS — Z833 Family history of diabetes mellitus: Secondary | ICD-10-CM | POA: Insufficient documentation

## 2019-09-09 DIAGNOSIS — Z85038 Personal history of other malignant neoplasm of large intestine: Secondary | ICD-10-CM | POA: Insufficient documentation

## 2019-09-09 DIAGNOSIS — I2510 Atherosclerotic heart disease of native coronary artery without angina pectoris: Secondary | ICD-10-CM | POA: Diagnosis not present

## 2019-09-09 DIAGNOSIS — E119 Type 2 diabetes mellitus without complications: Secondary | ICD-10-CM | POA: Diagnosis not present

## 2019-09-09 LAB — BASIC METABOLIC PANEL
Anion gap: 9 (ref 5–15)
BUN: 18 mg/dL (ref 8–23)
CO2: 25 mmol/L (ref 22–32)
Calcium: 9.2 mg/dL (ref 8.9–10.3)
Chloride: 105 mmol/L (ref 98–111)
Creatinine, Ser: 0.66 mg/dL (ref 0.44–1.00)
GFR calc Af Amer: >60 mL/min (ref >60)
GFR calc non Af Amer: >60 mL/min (ref >60)
Glucose, Bld: 117 mg/dL — ABNORMAL HIGH (ref 70–99)
Potassium: 4 mmol/L (ref 3.5–5.1)
Sodium: 139 mmol/L (ref 135–145)

## 2019-09-09 LAB — CBC
HCT: 44.5 % (ref 36.0–46.0)
Hemoglobin: 14.3 g/dL (ref 12.0–15.0)
MCH: 30.2 pg (ref 26.0–34.0)
MCHC: 32.1 g/dL (ref 30.0–36.0)
MCV: 94.1 fL (ref 80.0–100.0)
Platelets: 157 10*3/uL (ref 150–400)
RBC: 4.73 MIL/uL (ref 3.87–5.11)
RDW: 13.2 % (ref 11.5–15.5)
WBC: 4.7 10*3/uL (ref 4.0–10.5)
nRBC: 0 % (ref 0.0–0.2)

## 2019-09-09 NOTE — Progress Notes (Signed)
Advanced Heart Failure Clinic Note    Primary Care: Dr. Billey Gosling Primary Cardiologist: Dr. Aundra Dubin EP: Dr. Rayann Heman   HPI: Anita Duke is a 79 y.o. with a past medical history of pulmonary HTN, mitral regurgitation s/p mitral valve repair, MAZE procedure at the [facility] in 2001, at that time she also had a LIMA to LAD graft placed. Also with history of permanent Afib on warfarin s/p AVN ablation. At one time she was on Tikosyn which was stopped for QT prolongation.  She has chronic systolic CHF (EF 76% in 04/1156).  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 Last cath in 2014 with atretic LIMA to LAD but nonobstructive disease in LAD.   Echo in 10/18 showed EF improved to 55-60% with mild to moderate RV dysfunction.   Echo in 7/20 showed EF 50% with basal inferior and inferolateral akinesis, mildly decreased RV systolic function, s/p MV repair with trivial MR, no MS, normal IVC.   Patient returns for followup of CHF.  She seems to be doing well.  No dyspnea walking on flat ground.  She walks her dog several times a day.  She only notes dyspnea with lifting, carrying heavy loads.  No chest pain, no lightheadedness.  Weight is up about 4 lbs.    Labs (5/18): K 4.7, creatinine 0.62, LDL 71, HDL 61 Labs (10/18): K 3.5, creatinine 0.59, TSH normal Labs (7/20): K 3.9, creatinine 0.69, hgb 15.1, LDL 37, TSH normal  St Jude device interrogation (personally reviewed): Corevue shows stable thoracic impedance.  99% BiV pacing.   ECG (personally reviewed): atrial fibrillation, BiV pacing  ROS: All systems reviewed and negative except as per HPI.  PMH: 1. Type II diabetes. 2. COPD 3. Depression 4. H/o colon cancer 5. CAD: LIMA-LAD in 4/01 with MV repair.   - LHC (8/14): LIMA-LAD atretic, 50-60% mid LAD stenosis.  6. Mitral regurgitation: S/p MV repair at the [facility] in 4/01.  She also had LAA ligation and MAZE. Echo in 7/20 with trivial MR and no MS.  7. Chronic systolic CHF:  Suspect nonischemic cardiomyopathy.   - St Jude CRT-P device.  - Echo (4/16): EF 40%, mild LV dilation, normal RV size with mild to moderately decreased systolic function, s/p mitral valve repair with mild MR, no significant stenosis.  - Echo (10/18): EF 55-60%, severe LAE, RV with mild to moderate systolic dysfunction, mild-moderate TR, PASP 35 mmHg.  - Echo (7/20): EF 50% with basal inferior and inferolateral akinesis, mildly decreased RV systolic function, s/p MV repair with trivial MR, no MS, normal IVC.  8. Permanent atrial fibrillation: S/p MAZE in 4/01.  S/p AV nodal ablation in 9/13.   9. PFTs (1/19): No significant obstruction or restriction in the lungs.    Current Outpatient Medications  Medication Sig Dispense Refill  . ACCU-CHEK SOFTCLIX LANCETS lancets USE TO CHECK BLOOD SUGARS  TWICE A DAY 200 each 2  . acetaminophen (TYLENOL) 325 MG tablet Take 650 mg by mouth every 6 (six) hours as needed (pain).     Marland Kitchen amoxicillin (AMOXIL) 500 MG capsule Take 4 tabs prior to dental appointments 30 capsule 0  . atorvastatin (LIPITOR) 40 MG tablet Take 1 tablet (40 mg total) by mouth daily. 90 tablet 3  . Blood Glucose Monitoring Suppl (ACCU-CHEK AVIVA PLUS) w/Device KIT Use to check blood sugars daily Dx E11.9 1 kit 0  . buPROPion (WELLBUTRIN XL) 300 MG 24 hr tablet Take 1 tablet (300 mg total) by  mouth daily. 90 tablet 1  . carvedilol (COREG) 12.5 MG tablet Take 1 tablet (12.5 mg total) by mouth 2 (two) times daily with a meal. 180 tablet 3  . cholecalciferol (VITAMIN D) 1000 UNITS tablet Take 6,000 Units by mouth daily.     . fish oil-omega-3 fatty acids 1000 MG capsule Take 1 g by mouth 2 (two) times daily.     Marland Kitchen glucose blood (ACCU-CHEK AVIVA PLUS) test strip USE 2 TIMES DAILY AS  DIRECTED 200 each 2  . JARDIANCE 25 MG TABS tablet TAKE 1 TABLET BY MOUTH  DAILY 90 tablet 1  . loratadine (CLARITIN) 10 MG tablet Take 10 mg by mouth daily.    Marland Kitchen spironolactone (ALDACTONE) 25 MG tablet TAKE 1  TABLET BY MOUTH  DAILY 90 tablet 3  . trandolapril (MAVIK) 4 MG tablet TAKE 1 TABLET BY MOUTH TWO  TIMES DAILY FOR BLOOD  PRESSURE 180 tablet 1  . traZODone (DESYREL) 50 MG tablet Take 0.5-1 tablets (25-50 mg total) by mouth at bedtime as needed for sleep. 30 tablet 3  . warfarin (COUMADIN) 2.5 MG tablet Take 1 tablet daily except take 1 1/2 tablets on Mon Wed and Fri or TAKE AS DIRECTED BY ANTICOAGULATION CLINIC 120 tablet 1   No current facility-administered medications for this encounter.    Allergies  Allergen Reactions  . Demerol [Meperidine] Nausea And Vomiting  . Morphine Nausea And Vomiting  . Januvia [Sitagliptin] Other (See Comments)    Headaches, did not feel well  . Metformin And Related Diarrhea  . Tetanus Toxoid Rash and Other (See Comments)    "years ago"      Social History   Socioeconomic History  . Marital status: Widowed    Spouse name: Not on file  . Number of children: 0  . Years of education: Not on file  . Highest education level: Not on file  Occupational History  . 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 Smoking status: Never Smoker  . Smokeless tobacco: Never Used  Substance and Sexual Activity  . Alcohol use: No  . Drug use: No  . Sexual activity: Never    Birth control/protection: Surgical  Other Topics Concern  . Not on file  Social History Narrative      Social Determinants of Health   Financial Resource Strain:   . Difficulty of Paying Living Expenses: Not on file  Food Insecurity:   . Worried About Charity fundraiser in the Last Year: Not on file  . Ran Out of Food in the Last Year: Not on file  Transportation Needs:   . Lack of Transportation (Medical): Not on file  . Lack of Transportation (Non-Medical): Not on file  Physical Activity:   . Days of Exercise per Week: Not on file  . Minutes of Exercise per Session: Not on file  Stress:     . Feeling of Stress : Not on file  Social Connections:   . Frequency of Communication with Friends and Family: Not on file  . Frequency of Social Gatherings with Friends and Family: Not on file  . Attends Religious Services: Not on file  . Active Member of Clubs or Organizations: Not on file  . Attends Archivist Meetings: Not on file  . Marital Status: Not on file  Intimate Partner Violence:   . Fear of Current or Ex-Partner:  Not on file  . Emotionally Abused: Not on file  . Physically Abused: Not on file  . Sexually Abused: Not on file      Family History  Problem Relation Age of Onset  . Heart disease Father   . Diabetes Father   . Heart disease Sister        x 2  . Diabetes Mother   . Kidney disease Mother   . Thyroid disease Mother   . Pancreatic cancer Maternal Grandfather   . Diabetes Paternal Grandfather   . Diabetes Paternal Grandmother   . Heart disease Paternal Uncle        x 6  . Prostate cancer Paternal Uncle   . Colon cancer Neg Hx   . Colon polyps Neg Hx   . Adrenal disorder Neg Hx     Vitals:   09/09/19 0942  BP: (!) 104/52  Pulse: 70  SpO2: 99%  Weight: 67.1 kg (148 lb)     PHYSICAL EXAM: General: NAD Neck: No JVD, no thyromegaly or thyroid nodule.  Lungs: Clear to auscultation bilaterally with normal respiratory effort. CV: Nondisplaced PMI.  Heart regular S1/S2, no S3/S4, no murmur.  No peripheral edema.  No carotid bruit.  Normal pedal pulses.  Abdomen: Soft, nontender, no hepatosplenomegaly, no distention.  Skin: Intact without lesions or rashes.  Neurologic: Alert and oriented x 3.  Psych: Normal affect. Extremities: No clubbing or cyanosis.  HEENT: Normal.   ASSESSMENT & PLAN: 1. Chronic systolic CHF: EF 02% in 5427. NICM most likely.  She has LIMA-LAD that is atretic, but no flow limiting disease in the LAD.  Cardiomyopathy may be due to RV pacing after AV nodal ablation in 9/13. She now has St Jude CRT-P device and EF was  up to 55-60% on 10/18 echo. Echo in 7/20 showed EF about 50%.  NYHA class II symptoms.  She is not volume overloaded on exam or by Corevue.    - Continue Coreg 12.5 mg bid.  - Continue trandolapril 4 mg bid.  - She is doing fine off Lasix, not volume overloaded.   - Continue spironolactone 25 mg daily.    2. CAD: LIMA-LAD at time of MV surgery in 4/01, but LIMA-LAD atretic on 8/14 cath.  However, at that time her LAD had moderate stenosis that did not appear flow-limiting (50-60%).  No chest pain.  - Continue atorvastatin 40 mg daily. Good LDL in 7/20. - No ASA given stable CAD with use of warfarin 3. Complete heart block: She had an AV nodal ablation.  Now s/p CRT-P.   4. Permanent atrial fibrillation: Now s/p AV nodal ablation and BiV pacing. - Continue warfarin for anticoagulation. CBC today.   - We discussed transition to Eliquis, which I think would be ok (data suggests ok to use DOAC with valve repair, bioprosthetic valve).  However, she wants to continue warfarin as she has been on it stably x years.  5. DM: Continue Jardiance.  6. S/p MV repair: Stable mitral valve repair on 7/20 echo.    Followup in 6 months but will need BMET in 3 months.   Loralie Champagne, MD 09/09/19

## 2019-09-09 NOTE — Patient Instructions (Signed)
Labs today We will only contact you if something comes back abnormal or we need to make some changes. Otherwise no news is good news!  Your physician recommends that you schedule a follow-up appointment in: 6 months with Dr Aundra Dubin. You will receive a call in the spring to schedule this appointment. Please call us  If you do not receive this call.  Please call office at [phone] option [phone] if you have any questions or concerns.   At the [facility], you and your health needs are our priority. As part of our continuing mission to provide you with exceptional heart care, we have created designated Provider Care Teams. These Care Teams include your primary Cardiologist (physician) and Advanced Practice Providers (APPs- Physician Assistants and Nurse Practitioners) who all work together to provide you with the care you need, when you need it.   You may see any of the following providers on your designated Care Team at your next follow up: Marland Kitchen Dr Glori Bickers . Dr Loralie Champagne . Darrick Grinder, NP . Lyda Jester, PA . Audry Riles, PharmD   Please be sure to bring in all your medications bottles to every appointment.

## 2019-09-12 ENCOUNTER — Ambulatory Visit: Payer: Medicare Other

## 2019-09-13 DIAGNOSIS — G479 Sleep disorder, unspecified: Secondary | ICD-10-CM

## 2019-09-13 HISTORY — DX: Sleep disorder, unspecified: G47.9

## 2019-09-13 NOTE — Patient Instructions (Addendum)
  Blood work was ordered.     Medications reviewed and updated.  Changes include :  Try taking trazodone 50 mg at night   Your prescription(s) have been submitted to your pharmacy. Please take as directed and contact our office if you believe you are having problem(s) with the medication(s).   A referral was ordered for Neurology.    Please followup in 6 months

## 2019-09-13 NOTE — Progress Notes (Signed)
Subjective:    Patient ID: Anita Duke, female    DOB: 20-Mar-1941, 79 y.o.   MRN: [id number]  HPI The patient is here for follow up of their chronic medical problems, including CAD, Afib, hypertension, hyperlipidemia, diabetes, depression.   She is exercising regularly - walks the dog 3/day.    She is taking all of her medications as prescribed.     She is taking 25 mg of the trazodone - it sometimes works and sometimes does not.  She figured she should take one pill, but ideally wanted to keep it to 1/2 of a pill.    Her depression is fairly controlled.  COVID is making it difficult.    Medications and allergies reviewed with patient and updated if appropriate.  Patient Active Problem List   Diagnosis Date Noted  . Memory difficulties 09/15/2019  . Sleep difficulties 09/13/2019  . Embedded tick of neck 03/05/2019  . Depression 07/19/2018  . Lumbar radiculopathy, acute 06/17/2018  . Sweating abnormality 04/24/2018  . Diabetic neuropathy ([place]) 02/21/2018  . Long term (current) use of anticoagulants 06/01/2017  . 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 Excessive sweating 07/07/2016  . Osteopenia 01/10/2016  . Encounter for therapeutic drug monitoring 10/08/2015  . Heat intolerance 10/08/2015  . CAD (coronary artery disease) 05/28/2014  . Vitamin D deficiency 02/20/2014  . Long term current use of anticoagulant therapy 11/24/2013  . Chronic systolic dysfunction of left ventricle 05/21/2013  . 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 Acquired complete AV block   . Sick sinus syndrome ([place])   . Pacemaker-Medtronic 05/20/2012  . Allergic rhinitis 04/25/2012  . Mild intermittent asthma 02/16/2011  . Ejection fraction < 50%   . Atrial fibrillation ([place])   . Mitral regurgitation   . Right ventricular dysfunction   . Mitral stenosis   . S/P mitral valve repair   . Hx of CABG   . Diabetes type II 03/02/2009  . Hyperlipidemia 07/07/2008    . Essential hypertension 07/07/2008  . Diverticulosis of large intestine 08/04/2002    Current Outpatient Medications on File Prior to Visit  Medication Sig Dispense Refill  . ACCU-CHEK SOFTCLIX LANCETS lancets USE TO CHECK BLOOD SUGARS  TWICE A DAY 200 each 2  . acetaminophen (TYLENOL) 325 MG tablet Take 650 mg by mouth every 6 (six) hours as needed (pain).     Marland Kitchen amoxicillin (AMOXIL) 500 MG capsule Take 4 tabs prior to dental appointments 30 capsule 0  . atorvastatin (LIPITOR) 40 MG tablet Take 1 tablet (40 mg total) by mouth daily. 90 tablet 3  . Blood Glucose Monitoring Suppl (ACCU-CHEK AVIVA PLUS) w/Device KIT Use to check blood sugars daily Dx E11.9 1 kit 0  . buPROPion (WELLBUTRIN XL) 300 MG 24 hr tablet Take 1 tablet (300 mg total) by mouth daily. 90 tablet 1  . carvedilol (COREG) 12.5 MG tablet Take 1 tablet (12.5 mg total) by mouth 2 (two) times daily with a meal. 180 tablet 3  . cholecalciferol (VITAMIN D) 1000 UNITS tablet Take 6,000 Units by mouth daily.     . fish oil-omega-3 fatty acids 1000 MG capsule Take 1 g by mouth 2 (two) times daily.     Marland Kitchen glucose blood (ACCU-CHEK AVIVA PLUS) test strip USE 2 TIMES DAILY AS  DIRECTED 200 each 2  . JARDIANCE 25 MG TABS tablet TAKE 1 TABLET BY MOUTH  DAILY 90 tablet 1  . loratadine (CLARITIN) 10 MG tablet Take 10  mg by mouth daily.    Marland Kitchen spironolactone (ALDACTONE) 25 MG tablet TAKE 1 TABLET BY MOUTH  DAILY 90 tablet 3  . trandolapril (MAVIK) 4 MG tablet TAKE 1 TABLET BY MOUTH TWO  TIMES DAILY FOR BLOOD  PRESSURE 180 tablet 1  . traZODone (DESYREL) 50 MG tablet Take 0.5-1 tablets (25-50 mg total) by mouth at bedtime as needed for sleep. 30 tablet 3  . warfarin (COUMADIN) 2.5 MG tablet Take 1 tablet daily except take 1 1/2 tablets on Mon Wed and Fri or TAKE AS DIRECTED BY ANTICOAGULATION CLINIC 120 tablet 1   No current facility-administered medications on file prior to visit.    Past Medical History:  Diagnosis Date  . Anemia     Hemoglobin 10.4, December, 2013  . Arthritis    "not bad; little in my hands; some in my knees" (11/04/2013)  . Asthma   . CAD (coronary artery disease)    LIMA to the LAD at time of mitral valve repair / LIMA atretic,, February, 2011  . Cataracts, bilateral   . Cecal cancer ([place]) 2014   colon  . CHF (congestive heart failure) ([place])   . Colon polyp, hyperplastic   . Complication of anesthesia   . Depression   . Diverticulosis of colon   . GERD (gastroesophageal reflux disease)   . Hemorrhoids, internal   . High cholesterol   . HTN (hypertension)   . Mitral regurgitation    a. s/p repair with LAA ligation and MAZE at time of surgery  . Permanent atrial fibrillation ([facility])    a. s/p MAZE b. s/p AVN ablation  . PONV (postoperative nausea and vomiting)   . Presence of permanent cardiac pacemaker   . Pulmonary HTN ([place])   . QT prolongation    Tikosyn and Effexor. QT prolonged October 13, 2011, peak is in dose reduced from 500  to -250 twice a day  . Type II diabetes mellitus ([place])     Past Surgical History:  Procedure Laterality Date  . ABDOMINAL HYSTERECTOMY  ?2002  . AV NODE ABLATION N/A 05/28/2012   Procedure: AV NODE ABLATION;  Surgeon: Thompson Grayer, MD;  Location: [facility] CATH LAB;  Service: Cardiovascular;  Laterality: N/A;  . BI-VENTRICULAR PACEMAKER UPGRADE N/A 11/04/2013   upgrade of previously implanted dual chamber pacemaker to STJ Quadra Allure CRTP by Dr Rayann Heman  . BREAST EXCISIONAL BIOPSY Left over 10 years ago   benign  . BREAST SURGERY    . CARDIOVERSION  02/08/2012   Procedure: CARDIOVERSION;  Surgeon: Carlena Bjornstad, MD;  Location: [place];  Service: Cardiovascular;  Laterality: N/A;  . CARDIOVERSION  02/29/2012   Procedure: CARDIOVERSION;  Surgeon: Carlena Bjornstad, MD;  Location: [place];  Service: Cardiovascular;  Laterality: N/A;  . CARDIOVERSION  05/15/2012   Procedure: CARDIOVERSION;  Surgeon: Carlena Bjornstad, MD;  Location: [place];  Service: Cardiovascular;   Laterality: N/A;  . CATARACT EXTRACTION, BILATERAL  2017  . CHOLECYSTECTOMY  1995  . CORONARY ARTERY BYPASS GRAFT  2001   CABG X1 "at time of mitral valve repair" (12/04/2012  . DIAGNOSTIC LAPAROSCOPIC LIVER BIOPSY Left 12/03/2012   Procedure: DIAGNOSTIC LAPAROSCOPIC LIVER BIOPSY;  Surgeon: Stark Klein, MD;  Location: [place];  Service: General;  Laterality: Left;  . EYE SURGERY     bilateral cataract surgery  . LAPAROSCOPIC RIGHT HEMI COLECTOMY  12/03/2012   Procedure: LAPAROSCOPIC RIGHT HEMI COLECTOMY;  Surgeon: Stark Klein, MD;  Location: [place];  Service: General;;  .  LUMBAR LAMINECTOMY/ DECOMPRESSION WITH MET-RX Left 06/17/2018   Procedure: left Lumbar three-fourextraforaminal Microdiscectomy with Met-Rx;  Surgeon: Kristeen Miss, MD;  Location: [place];  Service: Neurosurgery;  Laterality: Left;  Marland Kitchen MAZE  2001   w/ MVR & CABG  . MITRAL VALVE REPAIR  2001   "anterior and posterior leaflets" (12/04/2012)  . 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 TONSILLECTOMY AND ADENOIDECTOMY  ~ 1950  . UMBILICAL HERNIA REPAIR N/A 12/03/2012   Procedure: HERNIA REPAIR UMBILICAL ADULT;  Surgeon: Stark Klein, MD;  Location: [place] [facility];  Service: General;  Laterality: N/A;    Social History   Socioeconomic History  . Marital status: Widowed    Spouse name: Not on file  . Number of children: 0  . Years of education: Not on file  . Highest education level: Not on file  Occupational History  . 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 Smoking status: Never Smoker  . Smokeless tobacco: Never Used  Substance and Sexual Activity  . Alcohol use: No  . Drug use: No  . Sexual activity: Never    Birth control/protection: Surgical  Other Topics Concern  . Not on file  Social History Narrative      Social Determinants of Health   Financial Resource  Strain:   . Difficulty of Paying Living Expenses: Not on file  Food Insecurity:   . Worried About Charity fundraiser in the Last Year: Not on file  . Ran Out of Food in the Last Year: Not on file  Transportation Needs:   . Lack of Transportation (Medical): Not on file  . Lack of Transportation (Non-Medical): Not on file  Physical Activity:   . Days of Exercise per Week: Not on file  . Minutes of Exercise per Session: Not on file  Stress:   . Feeling of Stress : Not on file  Social Connections:   . Frequency of Communication with Friends and Family: Not on file  . Frequency of Social Gatherings with Friends and Family: Not on file  . Attends Religious Services: Not on file  . Active Member of Clubs or Organizations: Not on file  . Attends Archivist Meetings: Not on file  . Marital Status: Not on file    Family History  Problem Relation Age of Onset  . Heart disease Father   . Diabetes Father   . Heart disease Sister        x 2  . Diabetes Mother   . Kidney disease Mother   . Thyroid disease Mother   . Pancreatic cancer Maternal Grandfather   . Diabetes Paternal Grandfather   . Diabetes Paternal Grandmother   . Heart disease Paternal Uncle        x 6  . Prostate cancer Paternal Uncle   . Colon cancer Neg Hx   . Colon polyps Neg Hx   . Adrenal disorder Neg Hx     Review of Systems  Constitutional: Negative for chills and fever.  Respiratory: Negative for cough, shortness of breath and wheezing.   Cardiovascular: Negative for chest pain, palpitations and leg swelling.  Gastrointestinal: Negative for abdominal pain.       No gerd  Neurological: Negative for light-headedness and headaches.       Objective:   Vitals:  09/15/19 0803  BP: 140/72  Pulse: 69  Resp: 16  Temp: 98.4 F (36.9 C)  SpO2: 99%   BP Readings from Last 3 Encounters:  09/15/19 140/72  09/09/19 (!) 104/52  03/19/19 (!) 173/84   Wt Readings from Last 3 Encounters:  09/15/19  147 lb 3.2 oz (66.8 kg)  09/09/19 148 lb (67.1 kg)  05/01/19 140 lb (63.5 kg)   Body mass index is 26.92 kg/m.   Physical Exam    Constitutional: Appears well-developed and well-nourished. No distress.  HENT:  Head: Normocephalic and atraumatic.  Neck: Neck supple. No tracheal deviation present. No thyromegaly present.  No cervical lymphadenopathy Cardiovascular: Normal rate, regular rhythm and normal heart sounds.  No murmur heard. No carotid bruit .  No edema Pulmonary/Chest: Effort normal and breath sounds normal. No respiratory distress. No has no wheezes. No rales.  Skin: Skin is warm and dry. Not diaphoretic.  Psychiatric: Normal mood and affect. Behavior is normal.      Assessment & Plan:    See Problem List for Assessment and Plan of chronic medical problems.    This visit occurred during the SARS-CoV-2 public health emergency.  Safety protocols were in place, including screening questions prior to the visit, additional usage of staff PPE, and extensive cleaning of exam room while observing appropriate contact time as indicated for disinfecting solutions.

## 2019-09-15 ENCOUNTER — Other Ambulatory Visit: Payer: Self-pay

## 2019-09-15 ENCOUNTER — Encounter: Payer: Self-pay | Admitting: Internal Medicine

## 2019-09-15 ENCOUNTER — Ambulatory Visit (INDEPENDENT_AMBULATORY_CARE_PROVIDER_SITE_OTHER): Payer: Medicare Other | Admitting: Internal Medicine

## 2019-09-15 ENCOUNTER — Ambulatory Visit (INDEPENDENT_AMBULATORY_CARE_PROVIDER_SITE_OTHER): Payer: Medicare Other

## 2019-09-15 VITALS — BP 140/72 | HR 69 | Temp 98.4°F | Resp 16 | Ht 62.0 in | Wt 147.2 lb

## 2019-09-15 DIAGNOSIS — I4891 Unspecified atrial fibrillation: Secondary | ICD-10-CM | POA: Diagnosis not present

## 2019-09-15 DIAGNOSIS — F3289 Other specified depressive episodes: Secondary | ICD-10-CM

## 2019-09-15 DIAGNOSIS — E1159 Type 2 diabetes mellitus with other circulatory complications: Secondary | ICD-10-CM | POA: Diagnosis not present

## 2019-09-15 DIAGNOSIS — R413 Other amnesia: Secondary | ICD-10-CM | POA: Diagnosis not present

## 2019-09-15 DIAGNOSIS — Z950 Presence of cardiac pacemaker: Secondary | ICD-10-CM | POA: Diagnosis not present

## 2019-09-15 DIAGNOSIS — E782 Mixed hyperlipidemia: Secondary | ICD-10-CM

## 2019-09-15 DIAGNOSIS — I2510 Atherosclerotic heart disease of native coronary artery without angina pectoris: Secondary | ICD-10-CM

## 2019-09-15 DIAGNOSIS — I5022 Chronic systolic (congestive) heart failure: Secondary | ICD-10-CM | POA: Diagnosis not present

## 2019-09-15 DIAGNOSIS — G479 Sleep disorder, unspecified: Secondary | ICD-10-CM

## 2019-09-15 DIAGNOSIS — I10 Essential (primary) hypertension: Secondary | ICD-10-CM

## 2019-09-15 LAB — VITAMIN B12: Vitamin B-12: 313 pg/mL (ref 211–911)

## 2019-09-15 LAB — PROTIME-INR
INR: 2.1 ratio — ABNORMAL HIGH (ref 0.8–1.0)
Prothrombin Time: 24.5 s — ABNORMAL HIGH (ref 9.6–13.1)

## 2019-09-15 LAB — TSH: TSH: 1.12 u[IU]/mL (ref 0.35–4.50)

## 2019-09-15 LAB — HEMOGLOBIN A1C: Hgb A1c MFr Bld: 7 % — ABNORMAL HIGH (ref 4.6–6.5)

## 2019-09-15 NOTE — Assessment & Plan Note (Addendum)
Chronic Fairly controlled, stable Continue current dose of medication - bupropion 300 mg daily

## 2019-09-15 NOTE — Assessment & Plan Note (Signed)
Chronic Lipids well controlled Continue daily statin Regular exercise and healthy diet encouraged  

## 2019-09-15 NOTE — Assessment & Plan Note (Signed)
Chronic Follows with cardiology No symptoms concerning for angina Continue current medications

## 2019-09-15 NOTE — Assessment & Plan Note (Signed)
Chronic a1c today Will adjust medication if needed Encouraged diabetic diet and regular exercise Follow up in 6 months

## 2019-09-15 NOTE — Assessment & Plan Note (Signed)
She feels her memory is getting worse Her mom had alzheimers Will refer to neuro Check tsh, b12

## 2019-09-15 NOTE — Assessment & Plan Note (Signed)
Chronic BP well controlled Current regimen effective and well tolerated Continue current medications at current doses Recent cmp reviewed  

## 2019-09-15 NOTE — Assessment & Plan Note (Signed)
Chronic Asymptomatic On warfarin, coreg continue

## 2019-09-15 NOTE — Assessment & Plan Note (Signed)
chronic Trazodone 25 mg nightly - not always effective Try 50 mg nightly

## 2019-09-16 ENCOUNTER — Telehealth: Payer: Self-pay

## 2019-09-16 ENCOUNTER — Ambulatory Visit (INDEPENDENT_AMBULATORY_CARE_PROVIDER_SITE_OTHER): Payer: Medicare Other | Admitting: General Practice

## 2019-09-16 ENCOUNTER — Ambulatory Visit: Payer: Medicare Other

## 2019-09-16 DIAGNOSIS — Z7901 Long term (current) use of anticoagulants: Secondary | ICD-10-CM | POA: Diagnosis not present

## 2019-09-16 DIAGNOSIS — I4891 Unspecified atrial fibrillation: Secondary | ICD-10-CM | POA: Diagnosis not present

## 2019-09-16 NOTE — Telephone Encounter (Signed)
See other lab result note.

## 2019-09-16 NOTE — Patient Instructions (Signed)
.  [id number]  Continue to take 1 tablet daily except 1 1/2 tablets on Mon and Fridays.  Re-check in 4 weeks.

## 2019-09-16 NOTE — Progress Notes (Signed)
Agree with management.  Abhiram Criado J Surabhi Gadea, MD  

## 2019-09-19 NOTE — Progress Notes (Signed)
EPIC Encounter for ICM Monitoring  Patient Name: Anita Duke is a 79 y.o. female Date: 09/19/2019 Primary Care Physican: Binnie Rail, MD Primary Cardiologist:Varanasi/McLean Electrophysiologist: Allred Bi-V Pacing: 99% 08/13/2019 Weight:145 lbs  Transmission reviewed.   CorvueThoracic impedancenormal.  Takes Spironolactone25mg  take 1 tablet daily  Labs: 09/09/2019 Creatinine 0.66, BUN 18, Potassium 4.0, Sodium 139, GFR >60 A complete set of results can be found in Results Review.  Recommendations:None  Follow-up plan: ICM clinic phone appointment on2/15/2021. 91 day device clinic remote transmission 09/30/2019.    Copy of ICM check sent to Dr. Rayann Heman.   3 month ICM trend: 09/15/2019    1 Year ICM trend:       [name], RN 09/19/2019 9:34 AM

## 2019-09-24 ENCOUNTER — Other Ambulatory Visit: Payer: Self-pay | Admitting: Internal Medicine

## 2019-09-25 ENCOUNTER — Other Ambulatory Visit: Payer: Self-pay

## 2019-09-25 MED FILL — traZODone HCL 50 MG TABS: 50 | 30 days supply | Qty: 30 | Fill #1

## 2019-09-30 ENCOUNTER — Ambulatory Visit (INDEPENDENT_AMBULATORY_CARE_PROVIDER_SITE_OTHER): Payer: Medicare Other | Admitting: *Deleted

## 2019-09-30 ENCOUNTER — Encounter: Payer: Self-pay | Admitting: Internal Medicine

## 2019-09-30 DIAGNOSIS — I495 Sick sinus syndrome: Secondary | ICD-10-CM

## 2019-10-01 LAB — CUP PACEART REMOTE DEVICE CHECK
Battery Remaining Longevity: 73 mo
Battery Remaining Percentage: 80 %
Battery Voltage: 2.93 V
Date Time Interrogation Session: 20210126041311
Implantable Lead Implant Date: 20130913
Implantable Lead Implant Date: 20130913
Implantable Lead Implant Date: 20150303
Implantable Lead Location: 753858
Implantable Lead Location: 753859
Implantable Lead Location: 753860
Implantable Lead Model: 5076
Implantable Lead Model: 5092
Implantable Pulse Generator Implant Date: 20150303
Lead Channel Impedance Value: 380 Ohm
Lead Channel Impedance Value: 460 Ohm
Lead Channel Pacing Threshold Amplitude: 0.875 V
Lead Channel Pacing Threshold Amplitude: 1 V
Lead Channel Pacing Threshold Pulse Width: 0.4 ms
Lead Channel Pacing Threshold Pulse Width: 0.6 ms
Lead Channel Sensing Intrinsic Amplitude: 7.2 mV
Lead Channel Setting Pacing Amplitude: 2 V
Lead Channel Setting Pacing Amplitude: 2 V
Lead Channel Setting Pacing Pulse Width: 0.4 ms
Lead Channel Setting Pacing Pulse Width: 0.6 ms
Lead Channel Setting Sensing Sensitivity: 3 mV
Pulse Gen Model: 3242
Pulse Gen Serial Number: 7548835

## 2019-10-08 IMAGING — CT CT L SPINE W/ CM
1 of 7 series · 6 of 14 positions shown, 8 images · non-contrast
Comparison: CT abdomen pelvis dated November 19, 2012.

CLINICAL DATA: Lumbosacral spondylosis without myelopathy. Chronic
low back pain predominantly radiating into the left leg.
TECHNIQUE: Contiguous axial images were obtained through the lumbar spine after
the intrathecal infusion of contrast. Coronal and sagittal
reconstructions were obtained of the axial image sets.

[Series 3: l spine soft · axial · 0.26mm/px · z∈[-279,-120]mm · 6 of 75 slices shown, 8 images]
[im 11/75  soft-tissue]
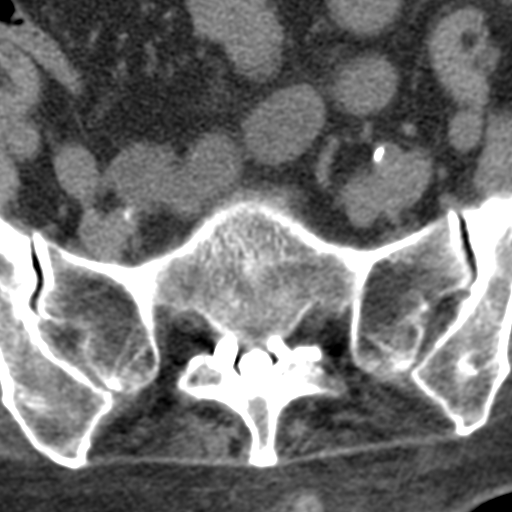
[im 11/75  bone]
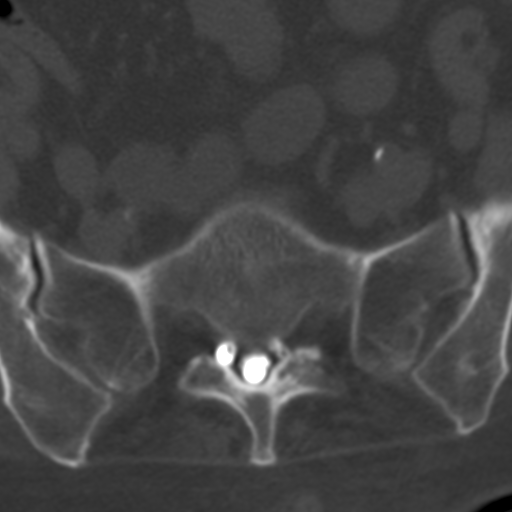
[im 22/75  bone]
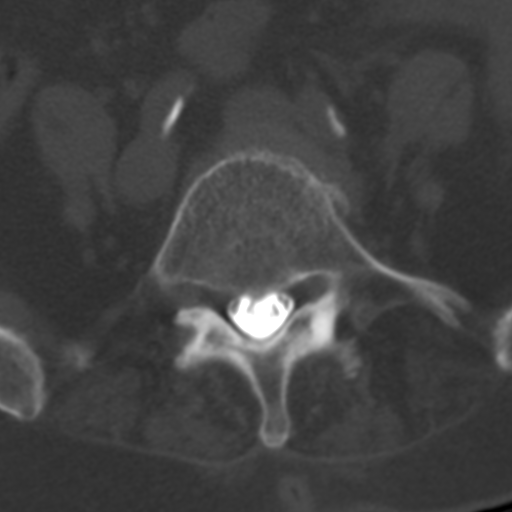
[im 32/75  bone]
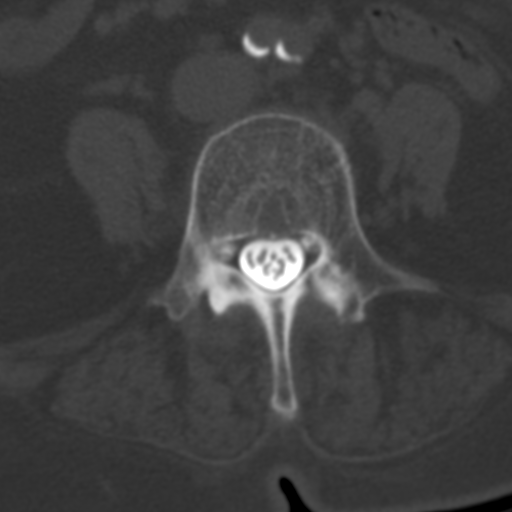
[im 43/75  bone]
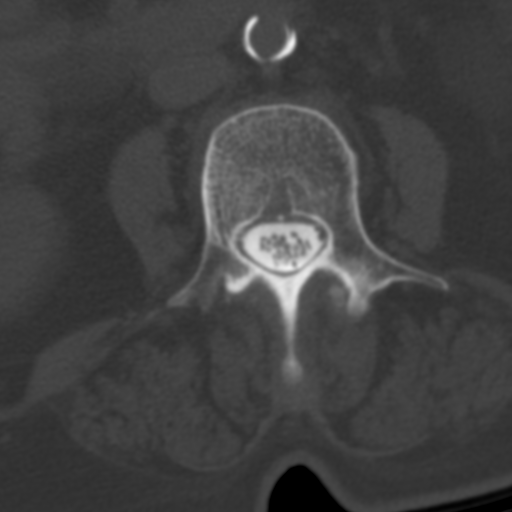
[im 53/75  soft-tissue]
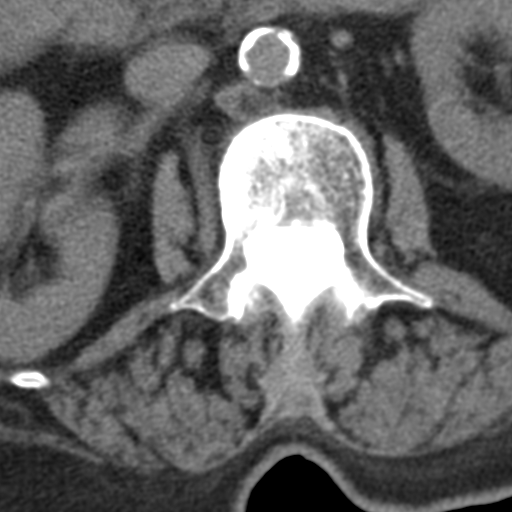
[im 53/75  bone]
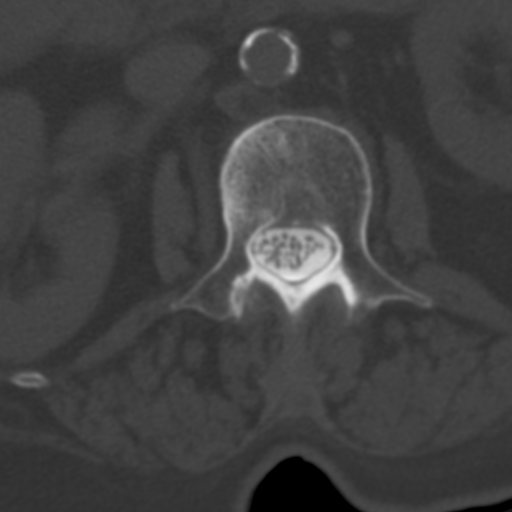
[im 64/75  bone]
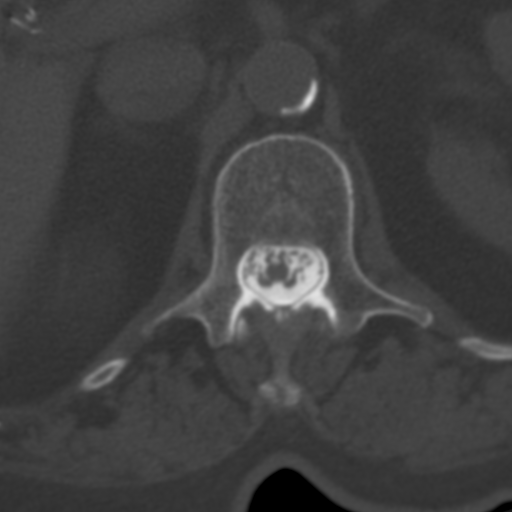

[6 of 14 positions shown; findings below may reference images not displayed]

EXAM:
LUMBAR MYELOGRAM

CT LUMBAR MYELOGRAM

FLUOROSCOPY TIME:  Radiation Exposure Index (as provided by the
fluoroscopic device): 176.52 Gy*m2

Fluoroscopy Time:  21 seconds

Number of Acquired Images:  15

PROCEDURE:
After thorough discussion of risks and benefits of the procedure
including bleeding, infection, injury to nerves, blood vessels,
adjacent structures as well as headache and CSF leak, written and
oral informed consent was obtained. Consent was obtained by Dr.
Mahnush Inka. Time out form was completed.

Patient was positioned prone on the fluoroscopy table. Local
anesthesia was provided with 1% lidocaine without epinephrine after
prepped and draped in the usual sterile fashion. Puncture was
performed at L3-L4 using a 3 1/2 inch 22-gauge spinal needle via
left paramedian approach. Using a single pass through the dura, the
needle was placed within the thecal sac, with return of clear CSF.
15 mL of Isovue [id number] was injected into the thecal sac, with normal
opacification of the nerve roots and cauda equina consistent with
free flow within the subarachnoid space.

I personally performed the lumbar puncture and administered the
intrathecal contrast. I also personally supervised acquisition of
the myelogram images.
FINDINGS: LUMBAR MYELOGRAM FINDINGS:

Five lumbar type vertebral bodies. No acute fracture or subluxation.
Vertebral body heights are preserved. 4 mm anterolisthesis at L3-L4
and 8 mm anterolisthesis at L4-L5, unchanged with standing, flexion,
or extension. Small ventral extradural defect at L1-L2, L2-L3, and
L3-L4. Mild central spinal canal stenosis and lateral recess
effacement from L2-L3 through L4-L5. Effacement of the exiting left
L3 nerve root. Findings are not significantly changed with standing,
flexion, or extension.

CT LUMBAR MYELOGRAM FINDINGS:

Segmentation: 5 lumbar type vertebral bodies.

Alignment: 4 mm anterolisthesis at L3-L4. 6 mm anterolisthesis at
L4-L5.

Vertebrae: No acute fracture or other focal pathologic process.
Prominent endplate sclerosis along the right aspect of the L2-L3
disc space.

Conus medullaris and cauda equina: Conus extends to the L1-L2 level.
Conus and cauda equina appear normal.

Paraspinal and other soft tissues: Aortic atherosclerosis. Mild
degenerative changes of the bilateral sacroiliac joints.

Disc levels:

T12-L1:  Negative.

L1-L2:  Small diffuse disc bulge.  No stenosis.

L2-L3: Diffuse disc bulge eccentric to the right. Mild bilateral
facet arthropathy with prominent ligamentum flavum hypertrophy. Mild
to moderate central spinal canal stenosis. Moderate right and mild
left lateral recess stenosis. Moderate right neuroforaminal
stenosis.

L3-L4: Disc uncovering and small diffuse disc bulge. Superimposed
left foraminal disc extrusion migrating superiorly. Mild bilateral
facet arthropathy with ligamentum flavum hypertrophy. Severe left
neuroforaminal stenosis with impingement on the exiting left L3
nerve root. Mild to moderate central spinal canal stenosis and mild
bilateral lateral recess stenosis.

L4-L5: Disc uncovering. Trace diffuse disc bulge eccentric to the
right. Mild bilateral facet arthropathy. Mild to moderate central
spinal canal and bilateral lateral recess stenosis. Mild bilateral
neuroforaminal stenosis.

L5-S1: Moderate disc height loss without significant disc bulge.
Moderate bilateral facet arthropathy. Mild left neuroforaminal
stenosis. No spinal canal or right neuroforaminal stenosis.
IMPRESSION: 1. Multilevel degenerative changes of the lumbar spine as described
above. Large left foraminal disc extrusion at L3-L4 with severe left
neuroforaminal stenosis and impingement on the exiting left L3 nerve
root.
2. Mild to moderate spinal canal stenosis from L2-L3 through L4-L5.
Moderate right neuroforaminal stenosis at L2-L3.
3. Grade 1 anterolisthesis at L3-L4 and L4-L5. No dynamic
instability.
4.  Aortic atherosclerosis ([id number]).

## 2019-10-14 ENCOUNTER — Other Ambulatory Visit: Payer: Self-pay

## 2019-10-14 ENCOUNTER — Ambulatory Visit (INDEPENDENT_AMBULATORY_CARE_PROVIDER_SITE_OTHER): Payer: Medicare Other | Admitting: General Practice

## 2019-10-14 DIAGNOSIS — I4891 Unspecified atrial fibrillation: Secondary | ICD-10-CM

## 2019-10-14 DIAGNOSIS — Z7901 Long term (current) use of anticoagulants: Secondary | ICD-10-CM | POA: Diagnosis not present

## 2019-10-14 LAB — POCT INR: INR: 2.5 (ref 2.0–3.0)

## 2019-10-14 NOTE — Patient Instructions (Addendum)
Pre visit review using our clinic review tool, if applicable. No additional management support is needed unless otherwise documented below in the visit note.  Continue to take 1 tablet daily except 1 1/2 tablets on Mon and Fridays.  Re-check in 4 weeks.  

## 2019-10-14 NOTE — Progress Notes (Signed)
Agree with management.  Gibson Telleria J Sanya Kobrin, MD  

## 2019-10-20 ENCOUNTER — Ambulatory Visit (INDEPENDENT_AMBULATORY_CARE_PROVIDER_SITE_OTHER): Payer: Medicare Other

## 2019-10-20 DIAGNOSIS — Z950 Presence of cardiac pacemaker: Secondary | ICD-10-CM

## 2019-10-20 DIAGNOSIS — I5022 Chronic systolic (congestive) heart failure: Secondary | ICD-10-CM

## 2019-10-20 MED FILL — AMOXICILLIN 500 MG CAPSULE: 500 | 7 days supply | Qty: 21 | Fill #0

## 2019-10-21 ENCOUNTER — Other Ambulatory Visit: Payer: Self-pay | Admitting: Internal Medicine

## 2019-10-21 DIAGNOSIS — Z1231 Encounter for screening mammogram for malignant neoplasm of breast: Secondary | ICD-10-CM

## 2019-10-24 ENCOUNTER — Telehealth: Payer: Self-pay

## 2019-10-24 NOTE — Telephone Encounter (Signed)
Remote ICM transmission received.  Attempted call to patient regarding ICM remote transmission and left detailed message per DPR.  Advised to return call for any fluid symptoms or questions.  

## 2019-10-24 NOTE — Progress Notes (Signed)
EPIC Encounter for ICM Monitoring  Patient Name: Anita Duke is a 79 y.o. female Date: 10/24/2019 Primary Care Physican: Binnie Rail, MD Primary Cardiologist:Varanasi/McLean Electrophysiologist: Allred Bi-V Pacing: 99% 12/9/2020Weight:145 lbs  Attempted call to patient and unable to reach.  Left detailed message per DPR regarding transmission. Transmission reviewed.   CorvueThoracic impedancenormal.  Takes Spironolactone25mg  take 1 tablet daily  Labs: 09/09/2019 Creatinine 0.66, BUN 18, Potassium 4.0, Sodium 139, GFR >60 A complete set of results can be found in Results Review.  Recommendations:Left voice mail with ICM number and encouraged to call if experiencing any fluid symptoms.  Follow-up plan: ICM clinic phone appointment on 11/24/2019. 91 day device clinic remote transmission 12/30/2019.    Copy of ICM check sent to Dr. Rayann Heman.   3 month ICM trend: 10/20/2019    1 Year ICM trend:       [name], RN 10/24/2019 4:18 PM

## 2019-10-28 MED FILL — traZODone HCL 50 MG TABS: 50 | 30 days supply | Qty: 30 | Fill #2

## 2019-11-11 ENCOUNTER — Ambulatory Visit: Payer: Medicare Other | Admitting: General Practice

## 2019-11-11 ENCOUNTER — Other Ambulatory Visit: Payer: Self-pay

## 2019-11-11 DIAGNOSIS — Z7901 Long term (current) use of anticoagulants: Secondary | ICD-10-CM | POA: Diagnosis not present

## 2019-11-11 DIAGNOSIS — I4891 Unspecified atrial fibrillation: Secondary | ICD-10-CM

## 2019-11-11 LAB — POCT INR: INR: 1.6 — AB (ref 2.0–3.0)

## 2019-11-11 NOTE — Patient Instructions (Addendum)
Pre visit review using our clinic review tool, if applicable. No additional management support is needed unless otherwise documented below in the visit note. Take 2 tablets today and tomorrow and then continue to take 1 tablet daily except 1 1/2 tablets on Mon and Fridays.  Re-check in 3 weeks.

## 2019-11-11 NOTE — Progress Notes (Signed)
Agree with management.  Drishti Pepperman J Ozie Dimaria, MD  

## 2019-11-14 ENCOUNTER — Encounter: Payer: Self-pay | Admitting: Neurology

## 2019-11-19 ENCOUNTER — Encounter: Payer: Self-pay | Admitting: Gastroenterology

## 2019-11-24 ENCOUNTER — Ambulatory Visit (INDEPENDENT_AMBULATORY_CARE_PROVIDER_SITE_OTHER): Payer: Medicare Other

## 2019-11-24 DIAGNOSIS — I5022 Chronic systolic (congestive) heart failure: Secondary | ICD-10-CM

## 2019-11-24 DIAGNOSIS — Z950 Presence of cardiac pacemaker: Secondary | ICD-10-CM

## 2019-11-26 NOTE — Progress Notes (Signed)
EPIC Encounter for ICM Monitoring  Patient Name: Sariya Randol is a 79 y.o. female Date: 11/26/2019 Primary Care Physican: Binnie Rail, MD Electrophysiologist: Allred Bi-V Pacing: 99% Last Weight:145 lbs  Transmission reviewed.   CorvueThoracic impedancenormal.  Takes Spironolactone25mg  take 1 tablet daily  Labs: 09/09/2019 Creatinine 0.66, BUN 18, Potassium 4.0, Sodium 139, GFR >60 A complete set of results can be found in Results Review.  Recommendations: None  Follow-up plan: ICM clinic phone appointment on 12/31/2019. 91 day device clinic remote transmission 12/30/2019.  Copy of ICM check sent to Dr.Allred.   3 month ICM trend: 11/24/2019    1 Year ICM trend:       [name], RN 11/26/2019 12:09 PM

## 2019-12-02 ENCOUNTER — Ambulatory Visit: Payer: Medicare Other

## 2019-12-02 ENCOUNTER — Ambulatory Visit: Payer: Medicare Other | Admitting: Internal Medicine

## 2019-12-02 ENCOUNTER — Other Ambulatory Visit: Payer: Self-pay

## 2019-12-02 DIAGNOSIS — I4891 Unspecified atrial fibrillation: Secondary | ICD-10-CM

## 2019-12-02 DIAGNOSIS — Z7901 Long term (current) use of anticoagulants: Secondary | ICD-10-CM

## 2019-12-02 LAB — POCT INR: INR: 2.4 (ref 2.0–3.0)

## 2019-12-02 NOTE — Progress Notes (Signed)
Agree with management.  Anita Duke J Peterson Mathey, MD  

## 2019-12-02 NOTE — Patient Instructions (Addendum)
Pre visit review using our clinic review tool, if applicable. No additional management support is needed unless otherwise documented below in the visit note.  Continue to take 1 tablet daily except 1 1/2 tablets on Mon and Fridays.  Re-check in 4 weeks.  

## 2019-12-20 ENCOUNTER — Other Ambulatory Visit: Payer: Self-pay | Admitting: Internal Medicine

## 2019-12-30 ENCOUNTER — Ambulatory Visit (INDEPENDENT_AMBULATORY_CARE_PROVIDER_SITE_OTHER): Payer: Medicare Other | Admitting: *Deleted

## 2019-12-30 ENCOUNTER — Ambulatory Visit: Payer: Medicare Other

## 2019-12-30 ENCOUNTER — Other Ambulatory Visit (HOSPITAL_COMMUNITY): Payer: Self-pay | Admitting: Cardiology

## 2019-12-30 ENCOUNTER — Ambulatory Visit: Payer: Medicare Other | Admitting: General Practice

## 2019-12-30 ENCOUNTER — Other Ambulatory Visit: Payer: Self-pay

## 2019-12-30 DIAGNOSIS — M25512 Pain in left shoulder: Secondary | ICD-10-CM | POA: Insufficient documentation

## 2019-12-30 DIAGNOSIS — Z7901 Long term (current) use of anticoagulants: Secondary | ICD-10-CM

## 2019-12-30 DIAGNOSIS — I495 Sick sinus syndrome: Secondary | ICD-10-CM | POA: Diagnosis not present

## 2019-12-30 LAB — CUP PACEART REMOTE DEVICE CHECK
Battery Remaining Longevity: 67 mo
Battery Remaining Percentage: 71 %
Battery Voltage: 2.92 V
Date Time Interrogation Session: 20210427020014
Implantable Lead Implant Date: 20130913
Implantable Lead Implant Date: 20130913
Implantable Lead Implant Date: 20150303
Implantable Lead Location: 753858
Implantable Lead Location: 753859
Implantable Lead Location: 753860
Implantable Lead Model: 5076
Implantable Lead Model: 5092
Implantable Pulse Generator Implant Date: 20150303
Lead Channel Impedance Value: 410 Ohm
Lead Channel Impedance Value: 460 Ohm
Lead Channel Pacing Threshold Amplitude: 0.875 V
Lead Channel Pacing Threshold Amplitude: 1 V
Lead Channel Pacing Threshold Pulse Width: 0.4 ms
Lead Channel Pacing Threshold Pulse Width: 0.6 ms
Lead Channel Sensing Intrinsic Amplitude: 7.4 mV
Lead Channel Setting Pacing Amplitude: 2 V
Lead Channel Setting Pacing Amplitude: 2 V
Lead Channel Setting Pacing Pulse Width: 0.4 ms
Lead Channel Setting Pacing Pulse Width: 0.6 ms
Lead Channel Setting Sensing Sensitivity: 3 mV
Pulse Gen Model: 3242
Pulse Gen Serial Number: 7548835

## 2019-12-30 LAB — POCT INR: INR: 2 (ref 2.0–3.0)

## 2019-12-30 NOTE — Patient Instructions (Addendum)
Pre visit review using our clinic review tool, if applicable. No additional management support is needed unless otherwise documented below in the visit note.  Take 1 1/2 tablets today and then continue to take 1 tablet daily except 1 1/2 tablets on Mon and Fridays.  Re-check in 6 weeks.

## 2019-12-31 ENCOUNTER — Ambulatory Visit (INDEPENDENT_AMBULATORY_CARE_PROVIDER_SITE_OTHER): Payer: Medicare Other

## 2019-12-31 DIAGNOSIS — Z950 Presence of cardiac pacemaker: Secondary | ICD-10-CM

## 2019-12-31 DIAGNOSIS — I5022 Chronic systolic (congestive) heart failure: Secondary | ICD-10-CM | POA: Diagnosis not present

## 2019-12-31 NOTE — Progress Notes (Signed)
PPM Remote  

## 2020-01-01 NOTE — Progress Notes (Signed)
Agree with management.  Randel Hargens J Priti Consoli, MD  

## 2020-01-02 ENCOUNTER — Other Ambulatory Visit: Payer: Self-pay | Admitting: Internal Medicine

## 2020-01-02 DIAGNOSIS — Z7901 Long term (current) use of anticoagulants: Secondary | ICD-10-CM

## 2020-01-05 NOTE — Progress Notes (Signed)
EPIC Encounter for ICM Monitoring  Patient Name: Anita Duke is a 79 y.o. female Date: 01/05/2020 Primary Care Physican: Binnie Rail, MD Electrophysiologist: Allred Bi-V Pacing: 99% Last Weight:145 lbs  Spoke with patient.  [place] the [place] during [place] impedance.     CorvueThoracic impedancenormal but was suggesting possible fluid accumulation from 4/3 - 4/16  Takes Spironolactone25mg  take 1 tablet daily  Labs: 09/09/2019 Creatinine 0.66, BUN 18, Potassium 4.0, Sodium 139, GFR >60 A complete set of results can be found in Results Review.  Recommendations:  No changes and encouraged to call if experiencing any fluid symptoms.  Follow-up plan: ICM clinic phone appointment on6/09/2019. 91 day device clinic remote transmission7/27/2021.  Copy of ICM check sent to Dr.Allred.  3 month ICM trend: 12/31/2019    1 Year ICM trend:       [name], RN 01/05/2020 4:41 PM

## 2020-01-06 ENCOUNTER — Encounter: Payer: Self-pay | Admitting: Gastroenterology

## 2020-01-06 ENCOUNTER — Other Ambulatory Visit: Payer: Self-pay

## 2020-01-06 ENCOUNTER — Ambulatory Visit: Payer: Medicare Other | Admitting: Gastroenterology

## 2020-01-06 VITALS — BP 94/54 | HR 72 | Temp 97.4°F | Ht 60.75 in | Wt 141.4 lb

## 2020-01-06 DIAGNOSIS — C180 Malignant neoplasm of cecum: Secondary | ICD-10-CM

## 2020-01-06 NOTE — Progress Notes (Signed)
Review of pertinent gastrointestinal problems: 1. Colon cancer, T2 N0 M0 cecal cancerdiagnosed 4 2014 during colonoscopy with Dr. Deatra Ina. She underwent laparoscopic "partial colectomy" 4 2014 with Dr. Barry Dienes. Colonoscopy 01/2014 Dr. Deatra Ina showed essentially normal anastomosis, 2 subCM adenomas were also removed.   Colonoscopy 01/2017 Dr. Ardis Hughs found normal ileocolonic anastomosis.  6 subcentimeter polyps were removed and 5 of them were adenomatous.  HPI: This is a very pleasant 79 year old woman whom I last saw about 3 years ago the time of a colonoscopy.  See those results summarized above  She has some significant cardiac issues but is generally in overall good health.  She gets around on her own.  She manages her household.  I believe she gets exercise.  She is in very good spirits as well.  She has had no overt GI bleeding.  Her weight is overall stable.  No significant changes in her bowels.  Echocardiogram July 2020 showed LV ejection fraction 50%.  She is on blood thinner for permanent atrial fibrillation, Coumadin.  CBC January 2021 was normal  ROS: complete GI ROS as described in HPI, all other review negative.  Constitutional:  No unintentional weight loss   Past Medical History:  Diagnosis Date  . Anemia    Hemoglobin 10.4, December, 2013  . Arthritis    "not bad; little in my hands; some in my knees" (11/04/2013)  . Asthma   . CAD (coronary artery disease)    LIMA to the LAD at time of mitral valve repair / LIMA atretic,, February, 2011  . Cataracts, bilateral   . Cecal cancer ([place]) 2014   colon  . CHF (congestive heart failure) ([place])   . Colon polyp, hyperplastic   . Complication of anesthesia   . Depression   . Diverticulosis of colon   . GERD (gastroesophageal reflux disease)   . Hemorrhoids, internal   . High cholesterol   . HTN (hypertension)   . Mitral regurgitation    a. s/p repair with LAA ligation and MAZE at time of surgery  . Permanent atrial  fibrillation (HCC)    a. s/p MAZE b. s/p AVN ablation  . PONV (postoperative nausea and vomiting)   . Presence of permanent cardiac pacemaker   . Pulmonary HTN ([place])   . QT prolongation    Tikosyn and Effexor. QT prolonged October 13, 2011, peak is in dose reduced from 500  to -250 twice a day  . Type II diabetes mellitus ([place])     Past Surgical History:  Procedure Laterality Date  . ABDOMINAL HYSTERECTOMY  ?2002  . AV NODE ABLATION N/A 05/28/2012   Procedure: AV NODE ABLATION;  Surgeon: Thompson Grayer, MD;  Location: [facility] CATH LAB;  Service: Cardiovascular;  Laterality: N/A;  . BI-VENTRICULAR PACEMAKER UPGRADE N/A 11/04/2013   upgrade of previously implanted dual chamber pacemaker to STJ Quadra Allure CRTP by Dr Rayann Heman  . BREAST EXCISIONAL BIOPSY Left over 10 years ago   benign  . BREAST SURGERY    . CARDIOVERSION  02/08/2012   Procedure: CARDIOVERSION;  Surgeon: Carlena Bjornstad, MD;  Location: [place];  Service: Cardiovascular;  Laterality: N/A;  . CARDIOVERSION  02/29/2012   Procedure: CARDIOVERSION;  Surgeon: Carlena Bjornstad, MD;  Location: [place];  Service: Cardiovascular;  Laterality: N/A;  . CARDIOVERSION  05/15/2012   Procedure: CARDIOVERSION;  Surgeon: Carlena Bjornstad, MD;  Location: [place];  Service: Cardiovascular;  Laterality: N/A;  . CATARACT EXTRACTION, BILATERAL  2017  . CHOLECYSTECTOMY  1995  .  CORONARY ARTERY BYPASS GRAFT  2001   CABG X1 "at time of mitral valve repair" (12/04/2012  . DIAGNOSTIC LAPAROSCOPIC LIVER BIOPSY Left 12/03/2012   Procedure: DIAGNOSTIC LAPAROSCOPIC LIVER BIOPSY;  Surgeon: Stark Klein, MD;  Location: [place];  Service: General;  Laterality: Left;  . EYE SURGERY     bilateral cataract surgery  . LAPAROSCOPIC RIGHT HEMI COLECTOMY  12/03/2012   Procedure: LAPAROSCOPIC RIGHT HEMI COLECTOMY;  Surgeon: Stark Klein, MD;  Location: [place];  Service: General;;  . LUMBAR LAMINECTOMY/ DECOMPRESSION WITH MET-RX Left 06/17/2018   Procedure: left Lumbar  three-fourextraforaminal Microdiscectomy with Met-Rx;  Surgeon: Kristeen Miss, MD;  Location: [place];  Service: Neurosurgery;  Laterality: Left;  Marland Kitchen MAZE  2001   w/ MVR & CABG  . MITRAL VALVE REPAIR  2001   "anterior and posterior leaflets" (12/04/2012)  . 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 TONSILLECTOMY AND ADENOIDECTOMY  ~ 1950  . UMBILICAL HERNIA REPAIR N/A 12/03/2012   Procedure: HERNIA REPAIR UMBILICAL ADULT;  Surgeon: Stark Klein, MD;  Location: [facility] [facility];  Service: General;  Laterality: N/A;    Current Outpatient Medications  Medication Sig Dispense Refill  . ACCU-CHEK SOFTCLIX LANCETS lancets USE TO CHECK BLOOD SUGARS  TWICE A DAY 200 each 2  . acetaminophen (TYLENOL) 325 MG tablet Take 650 mg by mouth every 6 (six) hours as needed (pain).     Marland Kitchen amoxicillin (AMOXIL) 500 MG capsule Take 4 tabs prior to dental appointments 30 capsule 0  . atorvastatin (LIPITOR) 40 MG tablet Take 1 tablet (40 mg total) by mouth daily. 90 tablet 3  . Blood Glucose Monitoring Suppl (ACCU-CHEK AVIVA PLUS) w/Device KIT Use to check blood sugars daily Dx E11.9 1 kit 0  . buPROPion (WELLBUTRIN XL) 300 MG 24 hr tablet TAKE 1 TABLET BY MOUTH  DAILY 90 tablet 1  . carvedilol (COREG) 12.5 MG tablet TAKE 1 TABLET BY MOUTH  TWICE DAILY WITH A MEAL 180 tablet 3  . cholecalciferol (VITAMIN D) 1000 UNITS tablet Take 6,000 Units by mouth daily.     . fish oil-omega-3 fatty acids 1000 MG capsule Take 1 g by mouth 2 (two) times daily.     Marland Kitchen glucose blood (ACCU-CHEK AVIVA PLUS) test strip USE 2 TIMES DAILY AS  DIRECTED 200 each 2  . JARDIANCE 25 MG TABS tablet TAKE 1 TABLET BY MOUTH  DAILY 90 tablet 1  . loratadine (CLARITIN) 10 MG tablet Take 10 mg by mouth daily.    Marland Kitchen spironolactone (ALDACTONE) 25 MG tablet TAKE 1 TABLET BY MOUTH  DAILY 90 tablet 3  . trandolapril (MAVIK) 4 MG tablet TAKE 1 TABLET BY MOUTH  TWICE DAILY FOR BLOOD  PRESSURE 180 tablet 0   . traZODone (DESYREL) 50 MG tablet Take 0.5-1 tablets (25-50 mg total) by mouth at bedtime as needed for sleep. 30 tablet 3  . warfarin (COUMADIN) 2.5 MG tablet TAKE 1 TABLET BY MOUTH  DAILY EXCEPT TAKE 1.5  TABLETS ON MONDAY,  WEDNESDAY, AND FRIDAY OR AS DIRECTED BY CLINIC 111 tablet 1   No current facility-administered medications for this visit.    Allergies as of 01/06/2020 - Review Complete 01/06/2020  Allergen Reaction Noted  . Demerol [meperidine] Nausea And Vomiting 10/31/2012  . Morphine Nausea And Vomiting   . Januvia [sitagliptin] Other (See Comments) 01/05/2016  . Metformin and related Diarrhea 10/08/2015  . Tetanus toxoid Rash and Other (See Comments)  Family History  Problem Relation Age of Onset  . Heart disease Father   . Diabetes Father   . Heart disease Sister        x 2  . Diabetes Mother   . Kidney disease Mother   . Thyroid disease Mother   . Pancreatic cancer Maternal Grandfather   . Diabetes Paternal Grandfather   . Diabetes Paternal Grandmother   . Heart disease Paternal Uncle        x 6  . Prostate cancer Paternal Uncle   . Colon cancer Neg Hx   . Colon polyps Neg Hx   . Adrenal disorder Neg Hx     Social History   Socioeconomic History  . Marital status: Widowed    Spouse name: Not on file  . Number of children: 0  . Years of education: Not on file  . Highest education level: Not on file  Occupational History  . 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 Smoking status: Never Smoker  . Smokeless tobacco: Never Used  Substance and Sexual Activity  . Alcohol use: No  . Drug use: No  . Sexual activity: Never    Birth control/protection: Surgical  Other Topics Concern  . Not on file  Social History Narrative      Social Determinants of Health   Financial Resource Strain:   . Difficulty of Paying Living Expenses:   Food Insecurity:   .  Worried About Charity fundraiser in the Last Year:   . Arboriculturist in the Last Year:   Transportation Needs:   . Film/video editor (Medical):   Marland Kitchen Lack of Transportation (Non-Medical):   Physical Activity:   . Days of Exercise per Week:   . Minutes of Exercise per Session:   Stress:   . Feeling of Stress :   Social Connections:   . Frequency of Communication with Friends and Family:   . Frequency of Social Gatherings with Friends and Family:   . Attends Religious Services:   . Active Member of Clubs or Organizations:   . Attends Archivist Meetings:   Marland Kitchen Marital Status:   Intimate Partner Violence:   . Fear of Current or Ex-Partner:   . Emotionally Abused:   Marland Kitchen Physically Abused:   . Sexually Abused:      Physical Exam: BP (!) 94/54 (BP Location: Left Arm, Patient Position: Sitting, Cuff Size: Normal)   Pulse 72   Temp (!) 97.4 F (36.3 C)   Ht 5' 0.75" (1.543 m) Comment: height measured without shoes  Wt 141 lb 6 oz (64.1 kg)   BMI 26.93 kg/m  Constitutional: generally well-appearing Psychiatric: alert and oriented x3 Abdomen: soft, nontender, nondistended, no obvious ascites, no peritoneal signs, normal bowel sounds No peripheral edema noted in lower extremities  Assessment and plan: 79 y.o. female with personal history of colon cancer, several adenomatous polyps removed 2018  I had a nice discussion with her.  She does understand that there are risks to colonoscopies including perforation, bleeding, missing a cancer.  They are all unlikely and we will certainly take care of her if any complications occur.  She is on Coumadin for atrial fibrillation and knows that she will need to hold that for 5 days prior to the colonoscopy.  She has had times where she has had to bridge with Lovenox.  We will reach out to her cardiologist for his advice on that.  She seems generally to be in good health.  She is worried about the possibility of recurrent cancer.  She  said several times in the visits that she definitely "does not want to die from colon cancer".  Please see the "Patient Instructions" section for addition details about the plan.  Owens Loffler, MD [facility] 01/06/2020, 10:04 AM   Total time on date of encounter was 30 minutes (this included time spent preparing to see the patient reviewing records; obtaining and/or reviewing separately obtained history; performing a medically appropriate exam and/or evaluation; counseling and educating the patient and family if present; ordering medications, tests or procedures if applicable; and documenting clinical information in the health record).

## 2020-01-06 NOTE — Patient Instructions (Signed)
If you are age 80 or older, your body mass index should be between 23-30. Your Body mass index is 26.93 kg/m. If this is out of the aforementioned range listed, please consider follow up with your Primary Care Provider.  If you are age 53 or younger, your body mass index should be between 19-25. Your Body mass index is 26.93 kg/m. If this is out of the aformentioned range listed, please consider follow up with your Primary Care Provider.   It has been recommended to you by your physician that you have a colonoscopy completed. Per your request, we did not schedule the procedure(s) today. Please contact our office at [phone] should you decide to have the procedure completed. You will be scheduled for a pre-visit and procedure at that time.  You will be contacted by our office prior to your procedure for directions on holding your Warfarin.  If you do not hear from our office 1 week prior to your scheduled procedure, please call [phone] to discuss. It will also need to be decided by your cardiologist if you will need Lovenox bridging.  Thank you for entrusting me with your care and choosing [facility].  Dr Ardis Hughs

## 2020-01-07 ENCOUNTER — Encounter: Payer: Self-pay | Admitting: Gastroenterology

## 2020-01-23 ENCOUNTER — Telehealth: Payer: Self-pay | Admitting: *Deleted

## 2020-01-23 NOTE — Telephone Encounter (Signed)
Anita Duke,  Pt was seen in the office 01-06-20 by dr Ardis Hughs- she is scheduled for a colon 6-23 WED- she is on Coumadin  We don't have a coumadin hold on this pt--can you please get her hold for her colon 6-23?  Thanks so much, [name] PV

## 2020-01-27 ENCOUNTER — Telehealth: Payer: Self-pay

## 2020-01-27 NOTE — Telephone Encounter (Signed)
Ok to hold warfarin for 5 days.  No need to lovenox bridge.

## 2020-01-27 NOTE — Telephone Encounter (Signed)
Anita Duke have you received any information on the coumadin hold.

## 2020-01-27 NOTE — Telephone Encounter (Signed)
[facility] Pre-operative Risk Assessment     Request for surgical clearance:     Endoscopy Procedure  What type of surgery is being performed?     Colonoscopy   When is this surgery scheduled?     02-25-20  What type of clearance is required ?   Pharmacy  Are there any medications that need to be held prior to surgery and how long? Coumadin x 5 days.  Will she also needs Lovenox bridge?  Practice name and name of physician performing surgery?      [facility]  What is your office phone and fax number?      Phone- [phone]  [phone]  Anesthesia type (None, local, MAC, general) ?       MAC

## 2020-01-27 NOTE — Telephone Encounter (Signed)
Left message for patient to return call.  Will continue efforts.  

## 2020-01-27 NOTE — Telephone Encounter (Signed)
Patient returned call and was given instructions on how to hold Coumadin/warfarin prior to colonoscopy.  Patient prefers to get approval from Cardiologist as she states cardiology manages INR.

## 2020-01-28 NOTE — Telephone Encounter (Signed)
Per Dr. Aundra Dubin. Ok to hold warfarin 5 days prior to procedure. Bridge with lovenox. Aim for lower side w/ lovenox Will send to [facility] as she manages coumadin

## 2020-01-28 NOTE — Telephone Encounter (Signed)
Anita Duke,  I see we have Coumadin hold from [place] but pt asked about Cardio doing hold- Will you send that to Cardio or is the Pt going to do that?  Please advise, Thanks so much, [name] Pre Visit

## 2020-01-28 NOTE — Telephone Encounter (Signed)
Anita Duke,  I have sent message to cardiology yesterday, but they have not responded yet.  Hopefully, I will get a response soon.  Thank you

## 2020-01-28 NOTE — Telephone Encounter (Signed)
Patient aware that per Dr Aundra Dubin it is OK to hold warfarin 5 days prior to colonoscopy with lovenox bridge.  Patient will contact [facility] at Coumadin clinic for further instructions closer to procedure date.  Patient agreed to plan and verbalized understanding.  No further questions.

## 2020-01-28 NOTE — Telephone Encounter (Signed)
Patient with diagnosis of afib on warfarin for anticoagulation.    Procedure: Colonoscopy    Date of procedure: 02/25/20  CHADS2-VASc score of  7 (CHF, HTN, AGE, DM2, CAD, AGE, female)  Protocol would say patient is in need of a lovenox bridge due to [place]. However due to patients age, I will ask defer to Dr. Aundra Dubin.

## 2020-01-28 NOTE — Telephone Encounter (Signed)
Think ok to bridge, aim on the lower side with Lovenox.

## 2020-01-28 NOTE — Telephone Encounter (Signed)
Also dr Ardis Hughs note from 01-06-20 states we need hold from Cardiology. Thanks

## 2020-01-28 NOTE — Telephone Encounter (Signed)
   Primary Cardiologist: Loralie Champagne, MD  Chart reviewed as part of pre-operative protocol coverage. Given past medical history and time since last visit, based on ACC/AHA guidelines, [place] would be at acceptable risk for the planned procedure without further cardiovascular testing.  Patient with diagnosis of afib on warfarin for anticoagulation.    Procedure: Colonoscopy Date of procedure: 02/25/20  CHADS2-VASc score of  7 (CHF, HTN, AGE, DM2, CAD, AGE, female)  Per Dr. Aundra Dubin. Ok to hold warfarin 5 days prior to procedure. Bridge with lovenox. Aim for lower side w/ lovenox Will send to [facility] as she manages coumadin   I will route this recommendation to the requesting party via Epic fax function and remove from pre-op pool.  Please call with questions.  Jossie Ng. Josemaria Brining NP-C    01/28/2020, 10:59 AM [place] [address] Office [phone] Fax [phone]

## 2020-01-28 NOTE — Telephone Encounter (Signed)
error 

## 2020-02-03 ENCOUNTER — Telehealth: Payer: Self-pay | Admitting: General Practice

## 2020-02-03 NOTE — Telephone Encounter (Signed)
Instructions for warfarin and Lovenox pre and post procedure on 6/23.  6/18 - Last dose of warfarin until after procedure 6/19 - Nothing (No warfarin and no Lovenox) 6/20 - Lovenox in the AM 6/21 - Lovenox in the AM 6/22 - Lovenox in the AM (Take by 7 am) 6/23 - Procedure (Do not take Lovenox today) 6/24 - Lovenox in the AM and take 2 tablets of warfarin 6/25 - Lovenox in the AM and 2 tablets of warfarin 6/26 - Lovenox in the AM and 2 tablets of warfarin 6/27 - Lovenox in the AM and 1 1/2 tablets of warfarin 6/28 - Stop Lovenox and continue to take current dosage of warfarin. 6/29 - Check INR

## 2020-02-10 ENCOUNTER — Other Ambulatory Visit: Payer: Self-pay | Admitting: General Practice

## 2020-02-10 ENCOUNTER — Ambulatory Visit: Payer: Medicare Other | Admitting: General Practice

## 2020-02-10 ENCOUNTER — Other Ambulatory Visit: Payer: Self-pay

## 2020-02-10 DIAGNOSIS — Z7901 Long term (current) use of anticoagulants: Secondary | ICD-10-CM

## 2020-02-10 DIAGNOSIS — I4891 Unspecified atrial fibrillation: Secondary | ICD-10-CM | POA: Diagnosis not present

## 2020-02-10 LAB — POCT INR: INR: 2.1 (ref 2.0–3.0)

## 2020-02-10 MED ORDER — ENOXAPARIN SODIUM 60 MG/0.6ML ~~LOC~~ SOLN: 60.0000 mg | SUBCUTANEOUS | 0 refills | Status: DC

## 2020-02-10 NOTE — Progress Notes (Signed)
Agree with management.  Fatma Rutten J Deidra Spease, MD  

## 2020-02-10 NOTE — Patient Instructions (Addendum)
Pre visit review using our clinic review tool, if applicable. No additional management support is needed unless otherwise documented below in the visit note.  Continue to take 1 tablet daily except 1 1/2 tablets on Mon and Fridays.  Re-check on 6/29.  Please follow patient instructions.  Instructions for warfarin and Lovenox pre and post procedure on 6/23.  6/18 - Last dose of warfarin until after procedure 6/19 - Nothing (No warfarin and no Lovenox) 6/20 - Lovenox in the AM 6/21 - Lovenox in the AM 6/22 - Lovenox in the AM (Take by 7 am) 6/23 - Procedure (Do not take Lovenox today) 6/24 - Lovenox in the AM and take 2 tablets of warfarin 6/25 - Lovenox in the AM and 2 tablets of warfarin 6/26 - Lovenox in the AM and 2 tablets of warfarin 6/27 - Lovenox in the AM and 1 1/2 tablets of warfarin 6/28 - Stop Lovenox and continue to take current dosage of warfarin. 6/29 - Check INR

## 2020-02-11 ENCOUNTER — Other Ambulatory Visit: Payer: Self-pay

## 2020-02-11 ENCOUNTER — Ambulatory Visit (AMBULATORY_SURGERY_CENTER): Payer: Self-pay | Admitting: *Deleted

## 2020-02-11 VITALS — Ht 60.75 in | Wt 145.0 lb

## 2020-02-11 DIAGNOSIS — C180 Malignant neoplasm of cecum: Secondary | ICD-10-CM

## 2020-02-11 DIAGNOSIS — Z86010 Personal history of colonic polyps: Secondary | ICD-10-CM

## 2020-02-11 NOTE — Progress Notes (Signed)
No egg or soy allergy known to patient   issues with past sedation with any surgeries  or procedures of PONV   no intubation problems  No diet pills per patient No home 02 use per patient   +  blood thinners per patient - pt aware to stop coumadin 6-18 and she has her lovenox bridge instructions from [name] and the coumadin Clinic   Pt denies issues with constipation  No A fib or A flutter  EMMI video sent to pt's e mail   10-09-19 comp covid vaccines x 2   Due to the COVID-19 pandemic we are asking patients to follow these guidelines. Please only bring one care partner. Please be aware that your care partner may wait in the car in the parking lot or if they feel like they will be too hot to wait in the car, they may wait in the lobby on the 4th floor. All care partners are required to wear a mask the entire time (we do not have any that we can provide them), they need to practice social distancing, and we will do a Covid check for all patient's and care partners when you arrive. Also we will check their temperature and your temperature. If the care partner waits in their car they need to stay in the parking lot the entire time and we will call them on their cell phone when the patient is ready for discharge so they can bring the car to the front of the building. Also all patient's will need to wear a mask into building.

## 2020-02-13 ENCOUNTER — Encounter: Payer: Self-pay | Admitting: Neurology

## 2020-02-13 ENCOUNTER — Other Ambulatory Visit: Payer: Self-pay

## 2020-02-13 ENCOUNTER — Ambulatory Visit: Payer: Medicare Other | Admitting: Neurology

## 2020-02-13 VITALS — BP 164/72 | HR 94 | Ht 60.0 in | Wt 147.0 lb

## 2020-02-13 DIAGNOSIS — R413 Other amnesia: Secondary | ICD-10-CM | POA: Diagnosis not present

## 2020-02-13 NOTE — Progress Notes (Signed)
No ICM remote transmission received for 02/03/2020 and next ICM transmission scheduled for 03/01/2020.

## 2020-02-13 NOTE — Patient Instructions (Signed)
Good to meet you!  1. Schedule head CT without contrast  2. Schedule Neurocognitive testing   RECOMMENDATIONS FOR ALL PATIENTS WITH MEMORY PROBLEMS: 1. Continue to exercise (Recommend 30 minutes of walking everyday, or 3 hours every week) 2. Increase social interactions - continue going to [place] and enjoy social gatherings with friends and family 3. Eat healthy, avoid fried foods and eat more fruits and vegetables 4. Maintain adequate blood pressure, blood sugar, and blood cholesterol level. Reducing the risk of stroke and cardiovascular disease also helps promoting better memory. 5. Avoid stressful situations. Live a simple life and avoid aggravations. Organize your time and prepare for the next day in anticipation. 6. Sleep well, avoid any interruptions of sleep and avoid any distractions in the bedroom that may interfere with adequate sleep quality 7. Avoid sugar, avoid sweets as there is a strong link between excessive sugar intake, diabetes, and cognitive impairment We discussed the Mediterranean diet, which has been shown to help patients reduce the risk of progressive memory disorders and reduces cardiovascular risk. This includes eating fish, eat fruits and green leafy vegetables, nuts like almonds and hazelnuts, walnuts, and also use olive oil. Avoid fast foods and fried foods as much as possible. Avoid sweets and sugar as sugar use has been linked to worsening of memory function.

## 2020-02-13 NOTE — Progress Notes (Signed)
NEUROLOGY CONSULTATION NOTE  Anita Duke MRN: [id number] DOB: [birth year]  Referring provider: Dr. Billey Gosling Primary care provider: Dr. Billey Gosling  Reason for consult:  Memory loss  Dear Dr Quay Burow:  Thank you for your kind referral of Anita Duke for consultation of the above symptoms. Although her history is well known to you, please allow me to reiterate it for the purpose of our medical record. She is alone in the office today. Records and images were personally reviewed where available.   HISTORY OF PRESENT ILLNESS: This is an 79 year old right-handed woman with a history of hypertension, hyperlipidemia, pulmonary HTN, atrial fibrillation on Coumadin, s/p PPM, QT prolongation, colon cancer in 2014, mitral regurgitation s/p repair, depression, presenting for evaluation of memory loss. She feels her memory is consistent with age, however a friend that she sees daily is the one who is concerned. They eat dinner out every night and go to the same restaurant, then one night her friend called her to switch to a different restaurant but she forgot and went to the other one. Her friend then told her that she has been doing little things like that and should be evaluated. She lives alone with no family close by. She lives in independent living at [facility]. She drives and denies getting lost driving. She denies missing medications or bill payments. She occasionally misplaces things. She has not left the stove on. Her mother was diagnosed with Alzheimer's disease (by autopsy). She denies any significant head injuries or alcohol use.   She denies any headaches, dizziness, diplopia, dysarthria, neck/back pain, focal numbness/tingling/weakness, bowel/bladder dysfunction, anosmia, or tremors. All her life she has been prone to choking, but this has not been happening recently. She does not sleep well at all, Trazodone caused dizziness. She feels melatonin is helping her, she recently  increased to 43m qhs and slept better the past 2 days. She denies any falls. When asked about mood, she states "I'm just a cranky old lady." She gets depressed and lonely sometimes and became tearful in the office talking of her deceased husband. She likes to garden and keep busy. 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   Laboratory Data: Lab Results  Component Value Date   TSH 1.12 09/15/2019   Lab Results  Component Value Date   [id number] 56301/07/2020   Lab Results  Component Value Date   HGBA1C 7.0 (H) 09/15/2019     PAST MEDICAL HISTORY: Past Medical History:  Diagnosis Date  . Allergy    mild- uses claritin   . Anemia    Hemoglobin 10.4, December, 2013  . Arthritis    "not bad; little in my hands; some in my knees" (11/04/2013)  . Asthma   . CAD (coronary artery disease)    LIMA to the LAD at time of mitral valve repair / LIMA atretic,, February, 2011  . Cataracts, bilateral    removed bilat   . 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 CHF (congestive heart failure) ([facility]   . Clotting disorder ([facility]    21 yrs ago blood clot in heart   . Colon polyp, hyperplastic   . Complication of anesthesia   . Depression   . Diverticulosis of colon   . GERD (gastroesophageal reflux disease)   . Hemorrhoids, internal   . High cholesterol   . HTN (hypertension)   . Mitral regurgitation    a. s/p repair with LAA ligation and MAZE  at time of surgery  . Osteopenia    left hip   . Permanent atrial fibrillation (HCC)    a. s/p MAZE b. s/p AVN ablation  . PONV (postoperative nausea and vomiting)   . Presence of permanent cardiac pacemaker   . Pulmonary HTN ([place])   . QT prolongation    Tikosyn and Effexor. QT prolonged October 13, 2011, peak is in dose reduced from 500  to -250 twice a day  . Type II diabetes mellitus ([place])     PAST SURGICAL HISTORY: Past Surgical History:  Procedure Laterality Date  . ABDOMINAL HYSTERECTOMY  ?2002  . AV NODE ABLATION N/A 05/28/2012    Procedure: AV NODE ABLATION;  Surgeon: Thompson Grayer, MD;  Location: [facility] CATH LAB;  Service: Cardiovascular;  Laterality: N/A;  . BI-VENTRICULAR PACEMAKER UPGRADE N/A 11/04/2013   upgrade of previously implanted dual chamber pacemaker to STJ Quadra Allure CRTP by Dr Rayann Heman  . BREAST EXCISIONAL BIOPSY Left over 10 years ago   benign  . BREAST SURGERY    . CARDIOVERSION  02/08/2012   Procedure: CARDIOVERSION;  Surgeon: Carlena Bjornstad, MD;  Location: [place];  Service: Cardiovascular;  Laterality: N/A;  . CARDIOVERSION  02/29/2012   Procedure: CARDIOVERSION;  Surgeon: Carlena Bjornstad, MD;  Location: [place];  Service: Cardiovascular;  Laterality: N/A;  . CARDIOVERSION  05/15/2012   Procedure: CARDIOVERSION;  Surgeon: Carlena Bjornstad, MD;  Location: [place];  Service: Cardiovascular;  Laterality: N/A;  . CATARACT EXTRACTION, BILATERAL  2017  . CHOLECYSTECTOMY  1995  . COLON SURGERY  2014   right hemicolectomy   . COLONOSCOPY    . CORONARY ARTERY BYPASS GRAFT  2001   CABG X1 "at time of mitral valve repair" (12/04/2012  . DIAGNOSTIC LAPAROSCOPIC LIVER BIOPSY Left 12/03/2012   Procedure: DIAGNOSTIC LAPAROSCOPIC LIVER BIOPSY;  Surgeon: Stark Klein, MD;  Location: [place];  Service: General;  Laterality: Left;  . EYE SURGERY     bilateral cataract surgery  . LAPAROSCOPIC RIGHT HEMI COLECTOMY  12/03/2012   Procedure: LAPAROSCOPIC RIGHT HEMI COLECTOMY;  Surgeon: Stark Klein, MD;  Location: [place];  Service: General;;  . LUMBAR LAMINECTOMY/ DECOMPRESSION WITH MET-RX Left 06/17/2018   Procedure: left Lumbar three-fourextraforaminal Microdiscectomy with Met-Rx;  Surgeon: Kristeen Miss, MD;  Location: [place];  Service: Neurosurgery;  Laterality: Left;  Marland Kitchen MAZE  2001   w/ MVR & CABG  . MITRAL VALVE REPAIR  2001   "anterior and posterior leaflets" (12/04/2012)  . 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 POLYPECTOMY    . TONSILLECTOMY AND  ADENOIDECTOMY  ~ 1950  . UMBILICAL HERNIA REPAIR N/A 12/03/2012   Procedure: HERNIA REPAIR UMBILICAL ADULT;  Surgeon: Stark Klein, MD;  Location: [facility];  Service: General;  Laterality: N/A;    MEDICATIONS: Current Outpatient Medications on File Prior to Visit  Medication Sig Dispense Refill  . ACCU-CHEK SOFTCLIX LANCETS lancets USE TO CHECK BLOOD SUGARS  TWICE A DAY 200 each 2  . acetaminophen (TYLENOL) 325 MG tablet Take 650 mg by mouth every 6 (six) hours as needed (pain).     Marland Kitchen atorvastatin (LIPITOR) 40 MG tablet Take 1 tablet (40 mg total) by mouth daily. 90 tablet 3  . Blood Glucose Monitoring Suppl (ACCU-CHEK AVIVA PLUS) w/Device KIT Use to check blood sugars daily Dx E11.9 1 kit 0  . buPROPion (WELLBUTRIN XL) 300 MG 24 hr tablet TAKE 1 TABLET  BY MOUTH  DAILY 90 tablet 1  . carvedilol (COREG) 12.5 MG tablet TAKE 1 TABLET BY MOUTH  TWICE DAILY WITH A MEAL 180 tablet 3  . cholecalciferol (VITAMIN D) 1000 UNITS tablet Take 1,000 Units by mouth daily.     . fish oil-omega-3 fatty acids 1000 MG capsule Take 1 g by mouth 2 (two) times daily.     Marland Kitchen glucose blood (ACCU-CHEK AVIVA PLUS) test strip USE 2 TIMES DAILY AS  DIRECTED 200 each 2  . JARDIANCE 25 MG TABS tablet TAKE 1 TABLET BY MOUTH  DAILY 90 tablet 1  . loratadine (CLARITIN) 10 MG tablet Take 10 mg by mouth daily.    . Melatonin 5 MG CAPS Take by mouth at bedtime.     Marland Kitchen spironolactone (ALDACTONE) 25 MG tablet TAKE 1 TABLET BY MOUTH  DAILY 90 tablet 3  . trandolapril (MAVIK) 4 MG tablet TAKE 1 TABLET BY MOUTH  TWICE DAILY FOR BLOOD  PRESSURE 180 tablet 0  . warfarin (COUMADIN) 2.5 MG tablet TAKE 1 TABLET BY MOUTH  DAILY EXCEPT TAKE 1.5  TABLETS ON MONDAY,  WEDNESDAY, AND FRIDAY OR AS DIRECTED BY CLINIC 111 tablet 1  . amoxicillin (AMOXIL) 500 MG capsule Take 4 tabs prior to dental appointments (Patient not taking: Reported on 02/11/2020) 30 capsule 0  . enoxaparin (LOVENOX) 60 MG/0.6ML injection Inject 0.6 mLs (60 mg total) into the skin  daily for 7 doses. (Patient not taking: Reported on 02/11/2020) 4.2 mL 0   No current facility-administered medications on file prior to visit.    ALLERGIES: Allergies  Allergen Reactions  . Demerol [Meperidine] Nausea And Vomiting  . Morphine Nausea And Vomiting  . Januvia [Sitagliptin] Other (See Comments)    Headaches, did not feel well  . Metformin And Related Diarrhea  . Tetanus Toxoid Rash and Other (See Comments)    "years ago"    FAMILY HISTORY: Family History  Problem Relation Age of Onset  . Heart disease Father   . Diabetes Father   . Heart disease Sister        x 2  . Diabetes Mother   . Kidney disease Mother   . Thyroid disease Mother   . Pancreatic cancer Maternal Grandfather   . Diabetes Paternal Grandfather   . Diabetes Paternal Grandmother   . Heart disease Paternal Uncle        x 6  . Prostate cancer Paternal Uncle   . Colon cancer Maternal Aunt   . Colon cancer Maternal Aunt   . Colon cancer Cousin   . Colon polyps Neg Hx   . Adrenal disorder Neg Hx   . Esophageal cancer Neg Hx   . Rectal cancer Neg Hx   . Stomach cancer Neg Hx     SOCIAL HISTORY: Social History   Socioeconomic History  . Marital status: Widowed    Spouse name: Not on file  . Number of children: 0  . Years of education: Not on file  . Highest education level: Not on file  Occupational History  . 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 Smoking status: Never Smoker  . Smokeless tobacco: Never Used  Vaping Use  . Vaping Use: Never used  Substance and Sexual Activity  . Alcohol use: No  . Drug use: No  . Sexual activity: Never    Birth control/protection: Surgical  Other Topics  Concern  . Not on file  Social History Narrative      Right handed    Lives alone    Social Determinants of Health   Financial Resource Strain:   . Difficulty of Paying Living Expenses:   Food  Insecurity:   . Worried About Charity fundraiser in the Last Year:   . Arboriculturist in the Last Year:   Transportation Needs:   . Film/video editor (Medical):   Marland Kitchen Lack of Transportation (Non-Medical):   Physical Activity:   . Days of Exercise per Week:   . Minutes of Exercise per Session:   Stress:   . Feeling of Stress :   Social Connections:   . Frequency of Communication with Friends and Family:   . Frequency of Social Gatherings with Friends and Family:   . Attends Religious Services:   . Active Member of Clubs or Organizations:   . Attends Archivist Meetings:   Marland Kitchen Marital Status:   Intimate Partner Violence:   . Fear of Current or Ex-Partner:   . Emotionally Abused:   Marland Kitchen Physically Abused:   . Sexually Abused:     REVIEW OF SYSTEMS: Constitutional: No fevers, chills, or sweats, no generalized fatigue, change in appetite Eyes: No visual changes, double vision, eye pain Ear, nose and throat: No hearing loss, ear pain, nasal congestion, sore throat Cardiovascular: No chest pain, palpitations Respiratory:  No shortness of breath at rest or with exertion, wheezes GastrointestinaI: No nausea, vomiting, diarrhea, abdominal pain, fecal incontinence Genitourinary:  No dysuria, urinary retention or frequency Musculoskeletal:  No neck pain, back pain Integumentary: No rash, pruritus, skin lesions Neurological: as above Psychiatric: + depression, insomnia, no anxiety Endocrine: No palpitations, fatigue, diaphoresis, mood swings, change in appetite, change in weight, increased thirst Hematologic/Lymphatic:  No anemia, purpura, petechiae. Allergic/Immunologic: no itchy/runny eyes, nasal congestion, recent allergic reactions, rashes  PHYSICAL EXAM: Vitals:   02/13/20 0844  BP: (!) 164/72  Pulse: 94  SpO2: 100%   General: No acute distress Head:  Normocephalic/atraumatic Skin/Extremities: No rash, no edema Neurological Exam: Mental status: alert and oriented  to person, place, and time, no dysarthria or aphasia, Fund of knowledge is appropriate.  Recent and remote memory are intact.  Attention and concentration are normal.    SLUMS score 24/30.  [place] Mental Exam 02/13/2020  Weekday Correct 1  Current year 1  What state are we in? 1  Amount spent 1  Amount left 0  # of Animals 3  5 objects recall 4  Number series 2  Hour markers 2  Time correct 1  Placed X in triangle correctly 1  Largest Figure 1  Name of female 2  Date back to work 0  Type of work 2  State she lived in 2  Total score 24   Cranial nerves: CN I: not tested CN II: pupils equal, round and reactive to light, visual fields intact CN III, IV, VI:  full range of motion, no nystagmus, no ptosis CN V: facial sensation intact CN VII: upper and lower face symmetric CN VIII: hearing intact to conversation Bulk & Tone: normal, no fasciculations. Motor: 5/5 throughout with no pronator drift. Sensation: intact to light touch, cold, pin, vibration and joint position sense.  No extinction to double simultaneous stimulation.  Romberg test negative Deep Tendon Reflexes: +2 on left UE and LE, +1 on right UE and LE Cerebellar: no incoordination on finger to nose testing Gait: narrow-based  and steady, able to tandem walk adequately. Tremor: none  IMPRESSION: This is a pleasant 79 year old right-handed woman with a history of hypertension, hyperlipidemia, pulmonary HTN, atrial fibrillation on Coumadin, s/p PPM, QT prolongation, colon cancer in 2014, mitral regurgitation s/p repair, depression, presenting for evaluation of memory loss. She feels she is doing fine, however her friend has expressed concern. She is alone in the office today with no family/friend to corroborate history. Her neurological exam is largely non-focal except for slight asymmetry of reflexes on the left. SLUMS score 24/30. She denies any difficulties with complex tasks. She does have vascular risk factors,  head CT will be ordered to assess for underlying structural abnormality and assess vascular load (unable to do MRI due to PPM). Neurocognitive testing will be ordered to further evaluate cognitive concerns. At this time, no clear indication to start cholinesterase inhibitors. We discussed the importance of control of vascular risk factors, physical exercise, and brain stimulation exercises for brain health. Follow-up in 6 months, she knows to call for any changes.   Thank you for allowing me to participate in the care of this patient. Please do not hesitate to call for any questions or concerns.   Ellouise Newer, M.D.  CC: Dr. Quay Burow

## 2020-02-18 ENCOUNTER — Encounter: Payer: Self-pay | Admitting: Gastroenterology

## 2020-02-25 ENCOUNTER — Ambulatory Visit (AMBULATORY_SURGERY_CENTER): Payer: Medicare Other | Admitting: Gastroenterology

## 2020-02-25 ENCOUNTER — Encounter: Payer: Self-pay | Admitting: Gastroenterology

## 2020-02-25 ENCOUNTER — Other Ambulatory Visit: Payer: Self-pay

## 2020-02-25 VITALS — BP 87/54 | HR 70 | Temp 97.7°F | Resp 14 | Ht 60.75 in | Wt 145.0 lb

## 2020-02-25 DIAGNOSIS — Z85038 Personal history of other malignant neoplasm of large intestine: Secondary | ICD-10-CM | POA: Diagnosis not present

## 2020-02-25 DIAGNOSIS — Z86010 Personal history of colonic polyps: Secondary | ICD-10-CM | POA: Diagnosis not present

## 2020-02-25 DIAGNOSIS — D123 Benign neoplasm of transverse colon: Secondary | ICD-10-CM

## 2020-02-25 DIAGNOSIS — D125 Benign neoplasm of sigmoid colon: Secondary | ICD-10-CM

## 2020-02-25 MED ORDER — SODIUM CHLORIDE 0.9 % IV SOLN: 500.0000 mL | INTRAVENOUS | Status: DC

## 2020-02-25 NOTE — Op Note (Signed)
[facility] Patient Name: Anita Duke Procedure Date: 02/25/2020 8:27 AM MRN: [id number] Endoscopist: Milus Banister , MD Age: 79 Referring MD:  Date of Birth: May 18, 1941 Gender: Female Account #: [id number] Procedure:                Colonoscopy Indications:              High risk colon cancer surveillance: Personal                            history of colon cancer; Colon cancer, T2 N0 M0                            cecal cancerdiagnosed 4 2014 during colonoscopy                            with Dr. Deatra Ina. She underwent laparoscopic                            "partial colectomy" 4 2014 with Dr. Barry Dienes.                            Colonoscopy 01/2014 Dr. Deatra Ina showed essentially                            normal anastomosis, 2 subCM adenomas were also                            removed. Colonoscopy 01/2017 Dr. Ardis Hughs found                            normal ileocolonic anastomosis. 6 subcentimeter                            polyps were removed and 5 of them were adenomatous. Medicines:                Monitored Anesthesia Care Procedure:                Pre-Anesthesia Assessment:                           - Prior to the procedure, a History and Physical                            was performed, and patient medications and                            allergies were reviewed. The patient's tolerance of                            previous anesthesia was also reviewed. The risks                            and benefits of the procedure and the sedation  options and risks were discussed with the patient.                            All questions were answered, and informed consent                            was obtained. Prior Anticoagulants: The patient has                            taken Coumadin (warfarin), last dose was 5 days                            prior to procedure. ASA Grade Assessment: III - A                            patient with severe  systemic disease. After                            reviewing the risks and benefits, the patient was                            deemed in satisfactory condition to undergo the                            procedure.                           After obtaining informed consent, the colonoscope                            was passed under direct vision. Throughout the                            procedure, the patient's blood pressure, pulse, and                            oxygen saturations were monitored continuously. The                            Colonoscope was introduced through the anus and                            advanced to the the ileocolonic anastomosis. The                            colonoscopy was performed without difficulty. The                            patient tolerated the procedure well. The quality                            of the bowel preparation was good. The rectum was  photographed. Scope In: 8:34:29 AM Scope Out: 8:49:07 AM Scope Withdrawal Time: 0 hours 10 minutes 47 seconds  Total Procedure Duration: 0 hours 14 minutes 38 seconds  Findings:                 Normal IC anastosis.                           Four sessile polyps were found in the sigmoid colon                            and transverse colon. The polyps were 2 to 6 mm in                            size. These polyps were removed with a cold snare.                            Resection and retrieval were complete.                           The exam was otherwise without abnormality on                            direct and retroflexion views. Complications:            No immediate complications. Estimated blood loss:                            None. Estimated Blood Loss:     Estimated blood loss: none. Impression:               - Four 2 to 6 mm polyps in the sigmoid colon and in                            the transverse colon, removed with a cold snare.                             Resected and retrieved.                           - Normal IC anastomosis.                           - The examination was otherwise normal on direct                            and retroflexion views. Recommendation:           - Patient has a contact number available for                            emergencies. The signs and symptoms of potential                            delayed complications were discussed with the  patient. Return to normal activities tomorrow.                            Written discharge instructions were provided to the                            patient.                           - Resume previous diet.                           - Continue present medications. OK to resume your                            coumadin today and lovenox shots tonight (and then                            per coumadin clinic recs)                           - Await pathology results. Milus Banister, MD 02/25/2020 8:54:20 AM This report has been signed electronically.

## 2020-02-25 NOTE — Progress Notes (Signed)
Vitals by CW  Patient has 2 bruises to left upper arm.  She states that she " ran into something".

## 2020-02-25 NOTE — Patient Instructions (Signed)
Handouts given: Polyps Resume previous diet Continue present medications Await pathology results Resume lovenox this afternoon and coumadin tonight  YOU HAD AN ENDOSCOPIC PROCEDURE TODAY AT [facility]:   Refer to the procedure report that was given to you for any specific questions about what was found during the examination.  If the procedure report does not answer your questions, please call your gastroenterologist to clarify.  If you requested that your care partner not be given the details of your procedure findings, then the procedure report has been included in a sealed envelope for you to review at your convenience later.  YOU SHOULD EXPECT: Some feelings of bloating in the abdomen. Passage of more gas than usual.  Walking can help get rid of the air that was put into your GI tract during the procedure and reduce the bloating. If you had a lower endoscopy (such as a colonoscopy or flexible sigmoidoscopy) you may notice spotting of blood in your stool or on the toilet paper. If you underwent a bowel prep for your procedure, you may not have a normal bowel movement for a few days.  Please Note:  You might notice some irritation and congestion in your nose or some drainage.  This is from the oxygen used during your procedure.  There is no need for concern and it should clear up in a day or so.  SYMPTOMS TO REPORT IMMEDIATELY:   Following lower endoscopy (colonoscopy or flexible sigmoidoscopy):  Excessive amounts of blood in the stool  Significant tenderness or worsening of abdominal pains  Swelling of the abdomen that is new, acute  Fever of 100F or higher  For urgent or emergent issues, a gastroenterologist can be reached at any hour by calling [phone]. Do not use MyChart messaging for urgent concerns.    DIET:  We do recommend a small meal at first, but then you may proceed to your regular diet.  Drink plenty of fluids but you should avoid alcoholic beverages  for 24 hours.  ACTIVITY:  You should plan to take it easy for the rest of today and you should NOT DRIVE or use heavy machinery until tomorrow (because of the sedation medicines used during the test).    FOLLOW UP: Our staff will call the number listed on your records 48-72 hours following your procedure to check on you and address any questions or concerns that you may have regarding the information given to you following your procedure. If we do not reach you, we will leave a message.  We will attempt to reach you two times.  During this call, we will ask if you have developed any symptoms of COVID 19. If you develop any symptoms (ie: fever, flu-like symptoms, shortness of breath, cough etc.) before then, please call [phone].  If you test positive for Covid 19 in the 2 weeks post procedure, please call and report this information to [name].    If any biopsies were taken you will be contacted by phone or by letter within the next 1-3 weeks.  Please call us at [phone] if you have not heard about the biopsies in 3 weeks.    SIGNATURES/CONFIDENTIALITY: You and/or your care partner have signed paperwork which will be entered into your electronic medical record.  These signatures attest to the fact that that the information above on your After Visit Summary has been reviewed and is understood.  Full responsibility of the confidentiality of this discharge information lies with you and/or your  care-partner.

## 2020-02-25 NOTE — Progress Notes (Signed)
To PACU, VSS. Report to [email or web address] 

## 2020-02-25 NOTE — Progress Notes (Signed)
Called to room to assist during endoscopic procedure.  Patient ID and intended procedure confirmed with present staff. Received instructions for my participation in the procedure from the performing physician.  

## 2020-02-27 ENCOUNTER — Telehealth: Payer: Self-pay

## 2020-02-27 NOTE — Telephone Encounter (Signed)
  Follow up Call-  Call back number 02/25/2020  Post procedure Call Back phone  # [phone]  Permission to leave phone message Yes  Some recent data might be hidden     Patient questions:  Do you have a fever, pain , or abdominal swelling? No. Pain Score  0 *  Have you tolerated food without any problems? Yes.    Have you been able to return to your normal activities? Yes.    Do you have any questions about your discharge instructions: Diet   No. Medications  No. Follow up visit  No.  Do you have questions or concerns about your Care? No.  Actions: * If pain score is 4 or above: No action needed, pain <4.  1. Have you developed a fever since your procedure? no  2.   Have you had an respiratory symptoms (SOB or cough) since your procedure? no  3.   Have you tested positive for COVID 19 since your procedure no  4.   Have you had any family members/close contacts diagnosed with the COVID 19 since your procedure?  no   If yes to any of these questions please route to Joylene John, RN and Erenest Rasher, RN

## 2020-03-01 ENCOUNTER — Ambulatory Visit (INDEPENDENT_AMBULATORY_CARE_PROVIDER_SITE_OTHER): Payer: Medicare Other

## 2020-03-01 ENCOUNTER — Encounter: Payer: Self-pay | Admitting: Gastroenterology

## 2020-03-01 ENCOUNTER — Other Ambulatory Visit: Payer: Medicare Other

## 2020-03-01 DIAGNOSIS — I5022 Chronic systolic (congestive) heart failure: Secondary | ICD-10-CM

## 2020-03-01 DIAGNOSIS — Z950 Presence of cardiac pacemaker: Secondary | ICD-10-CM

## 2020-03-01 NOTE — Progress Notes (Signed)
EPIC Encounter for ICM Monitoring  Patient Name: Anita Duke is a 79 y.o. female Date: 03/01/2020 Primary Care Physican: Binnie Rail, MD Primary Cardiologist:Varanasi/McLean Electrophysiologist: Allred Bi-V Pacing: 99% 03/01/2020 Weight:147 lbs  Spoke with patient.  She was at the [employer] day weekend.  She denies fluid symptoms.  CorvueThoracic impedancesuggesting possible fluid accumulation starting 02/28/2020.  Impedance also suggesting possible fluid accumulation from 5/11 - 5/19 and 5/25 - 6/11.  Takes Spironolactone25mg  take 1 tablet daily  Labs: 09/09/2019 Creatinine 0.66, BUN 18, Potassium 4.0, Sodium 139, GFR >60 A complete set of results can be found in Results Review.  Recommendations: No changes and encouraged to call if experiencing any fluid symptoms.  Follow-up plan: ICM clinic phone appointment on7/02/2020 to recheck fluid levels. 91 day device clinic remote transmission7/27/2021.  Next office visit with Dr Aundra Dubin 03/30/2020.  Copy of ICM check sent to Dr.Allred and Dr Aundra Dubin for review and recommendations if needed.  3 month ICM trend: 03/01/2020    1 Year ICM trend:       Anita Billings, RN 03/01/2020 2:15 PM

## 2020-03-01 NOTE — Progress Notes (Signed)
Would have her take Lasix 20 mg daily x 2 days along with KCl 10 daily.  After that, have her keep Lasix to use prn (2 lb weight gain in 24 hrs or 3-4 lbs in a week).

## 2020-03-02 ENCOUNTER — Telehealth: Payer: Self-pay

## 2020-03-02 ENCOUNTER — Ambulatory Visit
Admission: RE | Admit: 2020-03-02 | Discharge: 2020-03-02 | Disposition: A | Discharge status: Home / Self Care | Payer: Medicare Other | Source: Ambulatory Visit | Attending: Neurology | Admitting: Neurology

## 2020-03-02 ENCOUNTER — Other Ambulatory Visit: Payer: Self-pay

## 2020-03-02 ENCOUNTER — Ambulatory Visit: Payer: Medicare Other | Admitting: General Practice

## 2020-03-02 ENCOUNTER — Other Ambulatory Visit: Payer: Self-pay | Admitting: Internal Medicine

## 2020-03-02 ENCOUNTER — Ambulatory Visit: Payer: Medicare Other

## 2020-03-02 DIAGNOSIS — Z7901 Long term (current) use of anticoagulants: Secondary | ICD-10-CM

## 2020-03-02 DIAGNOSIS — I4891 Unspecified atrial fibrillation: Secondary | ICD-10-CM

## 2020-03-02 DIAGNOSIS — R413 Other amnesia: Secondary | ICD-10-CM

## 2020-03-02 LAB — POCT INR: INR: 1.9 — AB (ref 2.0–3.0)

## 2020-03-02 NOTE — Telephone Encounter (Signed)
Called pt to notify of CT results per Dr Delice Lesch which shows age related changes but no tumor, stroke or bleed. She knows to cont with scheduled neuropsych testing.

## 2020-03-02 NOTE — Patient Instructions (Signed)
Pre visit review using our clinic review tool, if applicable. No additional management support is needed unless otherwise documented below in the visit note.  Take 1 1/2 tablets today and then continue to take 1 tablet daily except 1 1/2 tablets on Mon and Fridays.  Re-check in 4 weeks.

## 2020-03-03 MED ORDER — POTASSIUM CHLORIDE ER 10 MEQ PO TBCR
EXTENDED_RELEASE_TABLET | ORAL | 0 refills | Status: DC
Start: 2020-03-03 — End: 2020-03-30

## 2020-03-03 MED ORDER — FUROSEMIDE 20 MG PO TABS
ORAL_TABLET | ORAL | 1 refills | Status: DC
Start: 2020-03-03 — End: 2020-03-16

## 2020-03-03 MED FILL — FUROSEMIDE 20 MG TABS: 20 | 29 days supply | Qty: 30 | Fill #0

## 2020-03-03 MED FILL — POTASSIUM CHLORIDE CRYS ER: 10 | 2 days supply | Qty: 2 | Fill #0

## 2020-03-03 NOTE — Progress Notes (Signed)
Agree with management.  Shateka Petrea J Indonesia Mckeough, MD  

## 2020-03-03 NOTE — Progress Notes (Signed)
Spoke with patient.  Advised Dr Aundra Dubin ordered Lasix 20 mg daily x 2 days along with KCl 10 daily x 2 days.  After that, she can take Lasix to use prn (2 lb weight gain in 24 hrs or 3-4 lbs in a week).  She verbalized understanding and repeated instructions back correctly.

## 2020-03-03 NOTE — Progress Notes (Signed)
Requested Potassium clarification of recommendation:  Received: Judithe Modest, MD  Merleen Picazo Panda, RN Only the 2 days she is on Lasix

## 2020-03-03 NOTE — Progress Notes (Signed)
Attempted home and cell number.  Left message for patient to return call regarding recommendations.

## 2020-03-09 ENCOUNTER — Ambulatory Visit (INDEPENDENT_AMBULATORY_CARE_PROVIDER_SITE_OTHER): Payer: Medicare Other

## 2020-03-09 DIAGNOSIS — Z950 Presence of cardiac pacemaker: Secondary | ICD-10-CM

## 2020-03-09 DIAGNOSIS — I5022 Chronic systolic (congestive) heart failure: Secondary | ICD-10-CM

## 2020-03-10 NOTE — Progress Notes (Signed)
EPIC Encounter for ICM Monitoring  Patient Name: Anita Duke is a 79 y.o. female Date: 03/10/2020 Primary Care Physican: Binnie Rail, MD Primary Cardiologist:Varanasi/McLean Electrophysiologist: Allred Bi-V Pacing: 99% 03/01/2020 Weight:147 lbs  Spoke with patient and reports feeling well at this time.  Denies fluid symptoms.    CorvueThoracic impedancereturned to normal after taking 2 days of PRN Furosemide.  Prescribed:  Furosemide 20 mg take 1 tablet as needed for 2 lb weight gain in 24 hours or 3-4   Spironolactone25mg  take 1 tablet daily  Labs: 09/09/2019 Creatinine 0.66, BUN 18, Potassium 4.0, Sodium 139, GFR >60 A complete set of results can be found in Results Review.  Recommendations:No changes and encouraged to call if experiencing any fluid symptoms.  Advised to take PRN Furosemide for symptoms as prescribed.  Follow-up plan: ICM clinic phone appointment on8/10/2019. 91 day device clinic remote transmission7/27/2021.  EP/Cardiology Office Visits: 03/30/2020 with Dr. Aundra Dubin.    Copy of ICM check sent to Dr. Rayann Heman.   3 month ICM trend: 03/09/2020    1 Year ICM trend:       [name], RN 03/10/2020 10:00 AM

## 2020-03-15 NOTE — Progress Notes (Signed)
Subjective:    Patient ID: Anita Duke, female    DOB: 1941/06/27, 79 y.o.   MRN: [id number]  HPI The patient is here for follow up of their chronic medical problems, including CAD, Afib, htn, hyperlipidemia, DM, depression.   She is exercising regularly.   She walks her dog 3/day. She strolls but is moving.  Her sleep is still good.  She does not feel tired during the day.  She takes melatonin but not regularly.     Medications and allergies reviewed with patient and updated if appropriate.  Patient Active Problem List   Diagnosis Date Noted  . Memory difficulties 09/15/2019  . Sleep difficulties 09/13/2019  . Embedded tick of neck 03/05/2019  . Depression 07/19/2018  . Lumbar radiculopathy, acute 06/17/2018  . Sweating abnormality 04/24/2018  . Diabetic neuropathy ([place]) 02/21/2018  . Long term (current) use of anticoagulants 06/01/2017  . 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 Excessive sweating 07/07/2016  . Osteopenia 01/10/2016  . Encounter for therapeutic drug monitoring 10/08/2015  . Heat intolerance 10/08/2015  . CAD (coronary artery disease) 05/28/2014  . Vitamin D deficiency 02/20/2014  . Long term current use of anticoagulant therapy 11/24/2013  . Chronic systolic dysfunction of left ventricle 05/21/2013  . 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 Acquired complete AV block   . Sick sinus syndrome ([place])   . Pacemaker-Medtronic 05/20/2012  . Allergic rhinitis 04/25/2012  . Mild intermittent asthma 02/16/2011  . Ejection fraction < 50%   . Atrial fibrillation ([place])   . Mitral regurgitation   . Right ventricular dysfunction   . Mitral stenosis   . S/P mitral valve repair   . Hx of CABG   . Diabetes type II 03/02/2009  . Hyperlipidemia 07/07/2008  . Essential hypertension 07/07/2008  . Diverticulosis of large intestine 08/04/2002    Current Outpatient Medications on File Prior to Visit  Medication Sig Dispense Refill    . ACCU-CHEK SOFTCLIX LANCETS lancets USE TO CHECK BLOOD SUGARS  TWICE A DAY 200 each 2  . acetaminophen (TYLENOL) 325 MG tablet Take 650 mg by mouth every 6 (six) hours as needed (pain).     Marland Kitchen atorvastatin (LIPITOR) 40 MG tablet Take 1 tablet (40 mg total) by mouth daily. 90 tablet 3  . Blood Glucose Monitoring Suppl (ACCU-CHEK AVIVA PLUS) w/Device KIT Use to check blood sugars daily Dx E11.9 1 kit 0  . buPROPion (WELLBUTRIN XL) 300 MG 24 hr tablet TAKE 1 TABLET BY MOUTH  DAILY 90 tablet 3  . carvedilol (COREG) 12.5 MG tablet TAKE 1 TABLET BY MOUTH  TWICE DAILY WITH A MEAL 180 tablet 3  . cholecalciferol (VITAMIN D) 1000 UNITS tablet Take 1,000 Units by mouth daily.     . fish oil-omega-3 fatty acids 1000 MG capsule Take 1 g by mouth 2 (two) times daily.     Marland Kitchen glucose blood (ACCU-CHEK AVIVA PLUS) test strip USE 2 TIMES DAILY AS  DIRECTED 200 each 2  . JARDIANCE 25 MG TABS tablet TAKE 1 TABLET BY MOUTH  DAILY 90 tablet 1  . loratadine (CLARITIN) 10 MG tablet Take 10 mg by mouth daily.    . Melatonin 5 MG CAPS Take by mouth at bedtime.     . potassium chloride (KLOR-CON) 10 MEQ tablet Take 1 tablet (10 mEq total) daily, along with Lasix, for 2 days only. 2 tablet 0  . spironolactone (ALDACTONE) 25 MG tablet TAKE 1 TABLET BY MOUTH  DAILY 90 tablet 3  . trandolapril (MAVIK) 4 MG tablet TAKE 1 TABLET BY MOUTH  TWICE DAILY FOR BLOOD  PRESSURE 180 tablet 0  . warfarin (COUMADIN) 2.5 MG tablet TAKE 1 TABLET BY MOUTH  DAILY EXCEPT TAKE 1.5  TABLETS ON MONDAY,  WEDNESDAY, AND FRIDAY OR AS DIRECTED BY CLINIC 111 tablet 1  . amoxicillin (AMOXIL) 500 MG capsule Take 4 tabs prior to dental appointments (Patient not taking: Reported on 02/11/2020) 30 capsule 0   No current facility-administered medications on file prior to visit.    Past Medical History:  Diagnosis Date  . Allergy    mild- uses claritin   . Anemia    Hemoglobin 10.4, December, 2013  . Arthritis    "not bad; little in my hands; some in  my knees" (11/04/2013)  . Asthma   . CAD (coronary artery disease)    LIMA to the LAD at time of mitral valve repair / LIMA atretic,, February, 2011  . Cataracts, bilateral    removed bilat   . Cecal cancer ([place]) 2014   colon  . CHF (congestive heart failure) ([place])   . Clotting disorder ([place])    21 yrs ago blood clot in heart   . Colon polyp, hyperplastic   . Complication of anesthesia   . Depression   . Diverticulosis of colon   . GERD (gastroesophageal reflux disease)   . Hemorrhoids, internal   . High cholesterol   . HTN (hypertension)   . Mitral regurgitation    a. s/p repair with LAA ligation and MAZE at time of surgery  . Osteopenia    left hip   . Permanent atrial fibrillation ([facility])    a. s/p MAZE b. s/p AVN ablation  . PONV (postoperative nausea and vomiting)   . Presence of permanent cardiac pacemaker   . Pulmonary HTN ([place])   . QT prolongation    Tikosyn and Effexor. QT prolonged October 13, 2011, peak is in dose reduced from 500  to -250 twice a day  . Type II diabetes mellitus ([place])     Past Surgical History:  Procedure Laterality Date  . ABDOMINAL HYSTERECTOMY  ?2002  . AV NODE ABLATION N/A 05/28/2012   Procedure: AV NODE ABLATION;  Surgeon: Thompson Grayer, MD;  Location: [facility] CATH LAB;  Service: Cardiovascular;  Laterality: N/A;  . BI-VENTRICULAR PACEMAKER UPGRADE N/A 11/04/2013   upgrade of previously implanted dual chamber pacemaker to STJ Quadra Allure CRTP by Dr Rayann Heman  . BREAST EXCISIONAL BIOPSY Left over 10 years ago   benign  . BREAST SURGERY    . CARDIOVERSION  02/08/2012   Procedure: CARDIOVERSION;  Surgeon: Carlena Bjornstad, MD;  Location: [place];  Service: Cardiovascular;  Laterality: N/A;  . CARDIOVERSION  02/29/2012   Procedure: CARDIOVERSION;  Surgeon: Carlena Bjornstad, MD;  Location: [place];  Service: Cardiovascular;  Laterality: N/A;  . CARDIOVERSION  05/15/2012   Procedure: CARDIOVERSION;  Surgeon: Carlena Bjornstad, MD;  Location: [place];  Service:  Cardiovascular;  Laterality: N/A;  . CATARACT EXTRACTION, BILATERAL  2017  . CHOLECYSTECTOMY  1995  . COLON SURGERY  2014   right hemicolectomy   . COLONOSCOPY    . CORONARY ARTERY BYPASS GRAFT  2001   CABG X1 "at time of mitral valve repair" (12/04/2012  . DIAGNOSTIC LAPAROSCOPIC LIVER BIOPSY Left 12/03/2012   Procedure: DIAGNOSTIC LAPAROSCOPIC LIVER BIOPSY;  Surgeon: Stark Klein, MD;  Location: [place];  Service: General;  Laterality: Left;  .  EYE SURGERY     bilateral cataract surgery  . LAPAROSCOPIC RIGHT HEMI COLECTOMY  12/03/2012   Procedure: LAPAROSCOPIC RIGHT HEMI COLECTOMY;  Surgeon: Stark Klein, MD;  Location: [place];  Service: General;;  . LUMBAR LAMINECTOMY/ DECOMPRESSION WITH MET-RX Left 06/17/2018   Procedure: left Lumbar three-fourextraforaminal Microdiscectomy with Met-Rx;  Surgeon: Kristeen Miss, MD;  Location: [place];  Service: Neurosurgery;  Laterality: Left;  Marland Kitchen MAZE  2001   w/ MVR & CABG  . MITRAL VALVE REPAIR  2001   "anterior and posterior leaflets" (12/04/2012)  . 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 POLYPECTOMY    . TONSILLECTOMY AND ADENOIDECTOMY  ~ 1950  . UMBILICAL HERNIA REPAIR N/A 12/03/2012   Procedure: HERNIA REPAIR UMBILICAL ADULT;  Surgeon: Stark Klein, MD;  Location: [place] [facility];  Service: General;  Laterality: N/A;    Social History   Socioeconomic History  . Marital status: Widowed    Spouse name: Not on file  . Number of children: 0  . Years of education: Not on file  . Highest education level: Not on file  Occupational History  . 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 Smoking status: Never Smoker  . Smokeless tobacco: Never Used  Vaping Use  . Vaping Use: Never used  Substance and Sexual Activity  . Alcohol use: No  . Drug use: No  . Sexual activity: Never    Birth control/protection:  Surgical  Other Topics Concern  . Not on file  Social History Narrative      Right handed    Lives alone    Social Determinants of Health   Financial Resource Strain:   . Difficulty of Paying Living Expenses:   Food Insecurity:   . Worried About Charity fundraiser in the Last Year:   . Arboriculturist in the Last Year:   Transportation Needs:   . Film/video editor (Medical):   Marland Kitchen Lack of Transportation (Non-Medical):   Physical Activity:   . Days of Exercise per Week:   . Minutes of Exercise per Session:   Stress:   . Feeling of Stress :   Social Connections:   . Frequency of Communication with Friends and Family:   . Frequency of Social Gatherings with Friends and Family:   . Attends Religious Services:   . Active Member of Clubs or Organizations:   . Attends Archivist Meetings:   Marland Kitchen Marital Status:     Family History  Problem Relation Age of Onset  . Heart disease Father   . Diabetes Father   . Heart disease Sister        x 2  . Diabetes Mother   . Kidney disease Mother   . Thyroid disease Mother   . Pancreatic cancer Maternal Grandfather   . Diabetes Paternal Grandfather   . Diabetes Paternal Grandmother   . Heart disease Paternal Uncle        x 6  . Prostate cancer Paternal Uncle   . Colon cancer Maternal Aunt   . Colon cancer Maternal Aunt   . Colon cancer Cousin   . Colon polyps Neg Hx   . Adrenal disorder Neg Hx   . Esophageal cancer Neg Hx   . Rectal cancer Neg Hx   . Stomach cancer Neg Hx  Review of Systems  Constitutional: Negative for fever.  Respiratory: Negative for cough, shortness of breath and wheezing.   Cardiovascular: Negative for chest pain, palpitations and leg swelling.  Neurological: Negative for light-headedness and headaches.  Psychiatric/Behavioral: Positive for dysphoric mood (controlled) and sleep disturbance.       Objective:   Vitals:   03/16/20 0857  BP: 132/78  Pulse: 71  Temp: 98.1 F (36.7 C)    SpO2: 98%   BP Readings from Last 3 Encounters:  03/16/20 132/78  02/25/20 (!) 87/54  02/13/20 (!) 164/72   Wt Readings from Last 3 Encounters:  03/16/20 145 lb (65.8 kg)  02/25/20 145 lb (65.8 kg)  02/13/20 147 lb (66.7 kg)   Body mass index is 27.62 kg/m.   Physical Exam    Constitutional: Appears well-developed and well-nourished. No distress.  HENT:  Head: Normocephalic and atraumatic.  Neck: Neck supple. No tracheal deviation present. No thyromegaly present.  No cervical lymphadenopathy Cardiovascular: Normal rate, regular rhythm and normal heart sounds.   No murmur heard. No carotid bruit .  No edema Pulmonary/Chest: Effort normal and breath sounds normal. No respiratory distress. No has no wheezes. No rales.  Skin: Skin is warm and dry. Not diaphoretic.  Psychiatric: Normal mood and affect. Behavior is normal.      Assessment & Plan:    See Problem List for Assessment and Plan of chronic medical problems.    This visit occurred during the SARS-CoV-2 public health emergency.  Safety protocols were in place, including screening questions prior to the visit, additional usage of staff PPE, and extensive cleaning of exam room while observing appropriate contact time as indicated for disinfecting solutions.

## 2020-03-15 NOTE — Patient Instructions (Addendum)
  Blood work was ordered.   ° ° °Medications reviewed and updated.  Changes include :   none ° ° ° °Please followup in 6 months ° ° °

## 2020-03-16 ENCOUNTER — Encounter: Payer: Self-pay | Admitting: Internal Medicine

## 2020-03-16 ENCOUNTER — Ambulatory Visit: Payer: Medicare Other | Admitting: Internal Medicine

## 2020-03-16 ENCOUNTER — Other Ambulatory Visit: Payer: Self-pay

## 2020-03-16 VITALS — BP 132/78 | HR 71 | Temp 98.1°F | Ht 60.75 in | Wt 145.0 lb

## 2020-03-16 DIAGNOSIS — I2510 Atherosclerotic heart disease of native coronary artery without angina pectoris: Secondary | ICD-10-CM | POA: Diagnosis not present

## 2020-03-16 DIAGNOSIS — I10 Essential (primary) hypertension: Secondary | ICD-10-CM

## 2020-03-16 DIAGNOSIS — I4891 Unspecified atrial fibrillation: Secondary | ICD-10-CM | POA: Diagnosis not present

## 2020-03-16 DIAGNOSIS — E1159 Type 2 diabetes mellitus with other circulatory complications: Secondary | ICD-10-CM | POA: Diagnosis not present

## 2020-03-16 DIAGNOSIS — E538 Deficiency of other specified B group vitamins: Secondary | ICD-10-CM

## 2020-03-16 DIAGNOSIS — E782 Mixed hyperlipidemia: Secondary | ICD-10-CM

## 2020-03-16 DIAGNOSIS — F3289 Other specified depressive episodes: Secondary | ICD-10-CM | POA: Diagnosis not present

## 2020-03-16 DIAGNOSIS — G479 Sleep disorder, unspecified: Secondary | ICD-10-CM

## 2020-03-16 HISTORY — DX: Deficiency of other specified B group vitamins: E53.8

## 2020-03-16 NOTE — Assessment & Plan Note (Signed)
Taking daily  Check level

## 2020-03-16 NOTE — Assessment & Plan Note (Signed)
Chronic Check lipid panel  Continue daily statin Regular exercise and healthy diet encouraged  

## 2020-03-16 NOTE — Assessment & Plan Note (Signed)
Chronic still have difficulty sleeping Took melatonin for a while but not recently -- advised to restart nightly melatonin

## 2020-03-16 NOTE — Addendum Note (Signed)
Addended by: Binnie Rail on: 03/16/2020 09:45 AM   Modules accepted: Orders

## 2020-03-16 NOTE — Assessment & Plan Note (Signed)
Chronic No concerning symptoms Follows with cardio Continue current medication

## 2020-03-16 NOTE — Assessment & Plan Note (Signed)
Chronic BP well controlled Current regimen effective and well tolerated Continue current medications at current doses cmp  

## 2020-03-16 NOTE — Assessment & Plan Note (Signed)
Chronic Continue current medications Check a1c Low sugar / carb diet Stressed regular exercise   

## 2020-03-16 NOTE — Assessment & Plan Note (Signed)
Chronic Follows with cardio On warfarin, coreg Cbc, cmp

## 2020-03-16 NOTE — Assessment & Plan Note (Addendum)
Chronic Controlled, still has some depression Continue current dose of medication Bupropion 300 mg daily

## 2020-03-17 ENCOUNTER — Other Ambulatory Visit: Payer: Self-pay | Admitting: Internal Medicine

## 2020-03-17 ENCOUNTER — Ambulatory Visit
Admission: RE | Admit: 2020-03-17 | Discharge: 2020-03-17 | Disposition: A | Discharge status: Home / Self Care | Payer: Medicare Other | Source: Ambulatory Visit | Attending: Internal Medicine | Admitting: Internal Medicine

## 2020-03-17 DIAGNOSIS — Z1231 Encounter for screening mammogram for malignant neoplasm of breast: Secondary | ICD-10-CM

## 2020-03-17 LAB — COMPREHENSIVE METABOLIC PANEL
AG Ratio: 2.2 (calc) (ref 1.0–2.5)
ALT: 18 U/L (ref 6–29)
AST: 18 U/L (ref 10–35)
Albumin: 4.6 g/dL (ref 3.6–5.1)
Alkaline phosphatase (APISO): 61 U/L (ref 37–153)
BUN: 18 mg/dL (ref 7–25)
CO2: 30 mmol/L (ref 20–32)
Calcium: 10 mg/dL (ref 8.6–10.4)
Chloride: 104 mmol/L (ref 98–110)
Creat: 0.64 mg/dL (ref 0.60–0.88)
Globulin: 2.1 g/dL (calc) (ref 1.9–3.7)
Glucose, Bld: 116 mg/dL — ABNORMAL HIGH (ref 65–99)
Potassium: 3.8 mmol/L (ref 3.5–5.3)
Sodium: 142 mmol/L (ref 135–146)
Total Bilirubin: 0.9 mg/dL (ref 0.2–1.2)
Total Protein: 6.7 g/dL (ref 6.1–8.1)

## 2020-03-17 LAB — LIPID PANEL
Cholesterol: 138 mg/dL (ref <200)
HDL: 67 mg/dL (ref >=50)
LDL Cholesterol (Calc): 52 mg/dL (calc)
Non-HDL Cholesterol (Calc): 71 mg/dL (calc) (ref <130)
Total CHOL/HDL Ratio: 2.1 (calc) (ref <5.0)
Triglycerides: 104 mg/dL (ref <150)

## 2020-03-17 LAB — CBC WITH DIFFERENTIAL/PLATELET
Absolute Monocytes: 561 cells/uL (ref 200–950)
Basophils Absolute: 73 cells/uL (ref 0–200)
Basophils Relative: 1.1 %
Eosinophils Absolute: 79 cells/uL (ref 15–500)
Eosinophils Relative: 1.2 %
HCT: 44 % (ref 35.0–45.0)
Hemoglobin: 14.5 g/dL (ref 11.7–15.5)
Lymphs Abs: 1373 cells/uL (ref 850–3900)
MCH: 30.5 pg (ref 27.0–33.0)
MCHC: 33 g/dL (ref 32.0–36.0)
MCV: 92.6 fL (ref 80.0–100.0)
MPV: 10.1 fL (ref 7.5–12.5)
Monocytes Relative: 8.5 %
Neutro Abs: 4514 cells/uL (ref 1500–7800)
Neutrophils Relative %: 68.4 %
Platelets: 193 10*3/uL (ref 140–400)
RBC: 4.75 10*6/uL (ref 3.80–5.10)
RDW: 12.6 % (ref 11.0–15.0)
Total Lymphocyte: 20.8 %
WBC: 6.6 10*3/uL (ref 3.8–10.8)

## 2020-03-17 LAB — VITAMIN B12: Vitamin B-12: 1280 pg/mL — ABNORMAL HIGH (ref 200–1100)

## 2020-03-17 LAB — HEMOGLOBIN A1C
Hgb A1c MFr Bld: 6.6 % of total Hgb — ABNORMAL HIGH (ref <5.7)
Mean Plasma Glucose: 143 (calc)
eAG (mmol/L): 7.9 (calc)

## 2020-03-22 ENCOUNTER — Encounter: Payer: Self-pay | Admitting: Psychology

## 2020-03-22 ENCOUNTER — Ambulatory Visit (INDEPENDENT_AMBULATORY_CARE_PROVIDER_SITE_OTHER): Payer: Medicare Other | Admitting: Psychology

## 2020-03-22 ENCOUNTER — Ambulatory Visit: Payer: Medicare Other | Admitting: Psychology

## 2020-03-22 ENCOUNTER — Other Ambulatory Visit: Payer: Self-pay

## 2020-03-22 DIAGNOSIS — F331 Major depressive disorder, recurrent, moderate: Secondary | ICD-10-CM | POA: Diagnosis not present

## 2020-03-22 DIAGNOSIS — I4891 Unspecified atrial fibrillation: Secondary | ICD-10-CM | POA: Diagnosis not present

## 2020-03-22 DIAGNOSIS — E1159 Type 2 diabetes mellitus with other circulatory complications: Secondary | ICD-10-CM | POA: Diagnosis not present

## 2020-03-22 DIAGNOSIS — R4189 Other symptoms and signs involving cognitive functions and awareness: Secondary | ICD-10-CM

## 2020-03-22 NOTE — Patient Instructions (Signed)
Clinical Impression(s): Anita Duke pattern of performance is suggestive of neuropsychological functioning largely within normal limits. Somewhat isolated relative weaknesses (i.e., below average normative range) were exhibited across phonemic fluency and both encoding (i.e., learning) and retrieval aspects of memory. Regarding the latter, relative weaknesses were chiefly seen across a list learning task. Performance learning and later retrieving contextualized (i.e., story-based) information was consistently strong. Despite the presence of relative weaknesses, scores in the below average range are still within expectation given estimated premorbid intellectual abilities. Performance was appropriate across all other assessed cognitive domains with scores ranging from the average to exceptionally high normative ranges. Additionally, performance on a task assessing safety and judgment was exceptionally high. Anita Duke denied difficulties completing instrumental activities of daily living (ADLs) independently.  Across mood-related questionnaires, Anita Duke reported mild symptoms of anxiety and severe symptoms of depression. She also reported moderate sleep dysfunction. Ongoing mood and sleep concerns can certainly create and/or exacerbate ongoing cognitive dysfunction, especially across domains of processing speed, attention/concentration, executive functioning, and learning and memory. It is quite possible that these factors represent the primary etiology for experienced day-to-day cognitive difficulties. Anita Duke also has a large number of cardiovascular health conditions, which would increase the risk for a significant vascular component to her presentation. A vascular etiology would largely affect the same domains described above. A brain MRI to better assess the extent of small vessel ischemic changes likely cannot be completed due to her history of a permanent pacemaker. Despite some variability  across memory tasks and poor retention percentages at times, performance was consistently strong across contextualized tasks. This, coupled with strong performances across other cognitive domains does not strongly suggest Alzheimer's disease at the present time. Likewise, her cognitive and behavioral profile is not suggestive of any other form of neurodegenerative illness presently. Continued medical monitoring will be important moving forward.

## 2020-03-22 NOTE — Progress Notes (Signed)
   Psychometrician Note   Cognitive testing was administered to Anita Duke by Milana Kidney, B.S. (psychometrist) under the supervision of Dr. Christia Reading, Ph.D., licensed psychologist on 03/22/20. Ms. Doubrava did not appear overtly distressed by the testing session per behavioral observation or responses across self-report questionnaires. Dr. Christia Reading, Ph.D. checked in with Ms. Mccune as needed to manage any distress related to testing procedures (if applicable). Rest breaks were offered.    The battery of tests administered was selected by Dr. Christia Reading, Ph.D. with consideration to Ms. Wickizer's current level of functioning, the nature of her symptoms, emotional and behavioral responses during interview, level of literacy, observed level of motivation/effort, and the nature of the referral question. This battery was communicated to the psychometrist. Communication between Dr. Christia Reading, Ph.D. and the psychometrist was ongoing throughout the evaluation and Dr. Christia Reading, Ph.D. was immediately accessible at all times. Dr. Christia Reading, Ph.D. provided supervision to the psychometrist on the date of this service to the extent necessary to assure the quality of all services provided.    Helem Reesor will return within approximately 1-2 weeks for an interactive feedback session with Dr. Melvyn Novas at which time her test performances, clinical impressions, and treatment recommendations will be reviewed in detail. Ms. Schwan understands she can contact our office should she require our assistance before this time.  A total of 115 minutes of billable time were spent face-to-face with Ms. Marschner by the psychometrist. This includes both test administration and scoring time. Billing for these services is reflected in the clinical report generated by Dr. Christia Reading, Ph.D..  This note reflects time spent with the psychometrician and does not include test scores  or any clinical interpretations made by Dr. Melvyn Novas. The full report will follow in a separate note.

## 2020-03-22 NOTE — Progress Notes (Signed)
NEUROPSYCHOLOGICAL EVALUATION [facility] Department of Neurology  Date of Evaluation: March 22, 2020  Reason for Referral:   Anita Duke is a 79 y.o. right-handed Caucasian female referred by Ellouise Newer, M.D., to characterize her current cognitive functioning and assist with diagnostic clarity and treatment planning in the context of subjective cognitive decline.   Assessment and Plan:   Clinical Impression(s): Anita Duke pattern of performance is suggestive of neuropsychological functioning largely within normal limits. Somewhat isolated relative weaknesses (i.e., below average normative range) were exhibited across phonemic fluency and both encoding (i.e., learning) and retrieval aspects of memory. Regarding the latter, relative weaknesses were chiefly seen across a list learning task. Performance learning and later retrieving contextualized (i.e., story-based) information was consistently strong. Despite the presence of relative weaknesses, scores in the below average range are still within expectation given estimated premorbid intellectual abilities. Performance was appropriate across all other assessed cognitive domains with scores ranging from the average to exceptionally high normative ranges. Additionally, performance on a task assessing safety and judgment was exceptionally high. Anita Duke denied difficulties completing instrumental activities of daily living (ADLs) independently.  Across mood-related questionnaires, Anita Duke reported mild symptoms of anxiety and severe symptoms of depression. She also reported moderate sleep dysfunction. Ongoing mood and sleep concerns can certainly create and/or exacerbate ongoing cognitive dysfunction, especially across domains of processing speed, attention/concentration, executive functioning, and learning and memory. It is quite possible that these factors represent the primary etiology for  experienced day-to-day cognitive difficulties. Anita Duke also has a large number of cardiovascular health conditions, which would increase the risk for a significant vascular component to her presentation. A vascular etiology would largely affect the same domains described above. A brain MRI to better assess the extent of small vessel ischemic changes likely cannot be completed due to her history of a permanent pacemaker. Despite some variability across memory tasks and poor retention percentages at times, performance was consistently strong across contextualized tasks. This, coupled with strong performances across other cognitive domains does not strongly suggest Alzheimer's disease at the present time. Likewise, her cognitive and behavioral profile is not suggestive of any other form of neurodegenerative illness presently. Continued medical monitoring will be important moving forward.  Recommendations: A repeat neuropsychological evaluation in 24 months (or sooner if functional decline is noted) is recommended to assess the trajectory of future cognitive decline should it occur. This will also aid in future efforts towards improved diagnostic clarity.  A combination of medication and psychotherapy has been shown to be most effective at treating symptoms of anxiety and depression. As such, Anita Duke is encouraged to speak with her prescribing physician regarding medication adjustments to optimally manage these symptoms. Likewise, Anita Duke is encouraged to consider engaging in short-term psychotherapy to address symptoms of psychiatric distress. She would benefit from an active and collaborative therapeutic environment, rather than one purely supportive in nature. Recommended treatment modalities include Cognitive Behavioral Therapy (CBT) or Acceptance and Commitment Therapy (ACT).  She should also discuss options for improving ongoing sleep dysfunction with her PCP. While Trazodone has created  negative side effects in the past, there are likely several other medications that Anita Duke could use in an attempt to improve these symptoms.   Anita Duke is encouraged to attend to lifestyle factors for brain health (e.g., regular physical exercise, good nutrition habits, regular participation in cognitively-stimulating activities, and general stress management techniques), which are likely to have benefits for both emotional adjustment and  cognition. In fact, in addition to promoting good general health, regular exercise incorporating aerobic activities (e.g., brisk walking, jogging, cycling, etc.) has been demonstrated to be a very effective treatment for depression and stress, with similar efficacy rates to both antidepressant medication and psychotherapy. Optimal control of vascular risk factors (including safe cardiovascular exercise and adherence to dietary recommendations) is encouraged.  If interested, there are some activities which have therapeutic value and can be useful in keeping her cognitively stimulated. For suggestions, Anita Duke is encouraged to go to the following website: https://www.[facility].org/get-to-know-barrow/centers-programs/neurorehabilitation-center/neuro-rehab-apps-and-games/ which has options, categorized by level of difficulty. It should be noted that these activities should not be viewed as a substitute for therapy.  Because she shows better recall for structured information, she will likely understand and retain new information better if it is presented to her in a meaningful or well-organized manner at the outset, such as grouping items into meaningful categories or presenting information in an outlined, bulleted, or story format.   When learning new information, she would benefit from information being broken up into small, manageable pieces. She may also find it helpful to articulate the material in her own words and in a context to promote encoding at the  onset of a new task. This material may need to be repeated multiple times to promote encoding.  Memory can be improved using internal strategies such as rehearsal, repetition, chunking, mnemonics, association, and imagery. External strategies such as written notes in a consistently used memory journal, visual and nonverbal auditory cues such as a calendar on the refrigerator or appointments with alarm, such as on a cell phone, can also help maximize recall.    To address problems with attention in day-to-day interactions, she may wish to consider:   -Avoiding external distractions when needing to concentrate   -Limiting exposure to fast paced environments with multiple sensory demands   -Writing down complicated information and using checklists   -Attempting and completing one task at a time (i.e., no multi-tasking)   -Verbalizing aloud each step of a task to maintain focus   -Reducing the amount of information considered at one time  Review of Records:   Anita Duke was seen by [facility]rology Marland KitchenEllouise Newer, M.D.) on 02/13/2020 for an evaluation of memory loss. When meeting with Dr. Delice Lesch, Anita Duke reporting feeling that her memory is age-appropriate. However, a friend that she sees daily has expressed concerns. For example, they eat dinner out every night and go to the same restaurant. However, one night her friend called her to switch to a different restaurant. Anita Duke forgot this and went to the original restaurant. Her friend then told her that she has been doing little things like that and should be evaluated. Anita Duke lives alone (independent living at [place]) with no family close by. She drives and denied getting lost. She also denied missing medications or bill payments. She did acknowledge occasionally misplacing things. Her mother was diagnosed with Alzheimer's disease (by autopsy). She denied any significant head injuries or alcohol use. She also denied headaches, dizziness,  diplopia, dysarthria, neck/back pain, focal numbness/tingling/weakness, bowel/bladder dysfunction, anosmia, or tremors. She does not sleep well. Prior medication interventions (i.e., Trazodone) caused dizziness. When asked about mood, she stated "I'm just a cranky old lady." She acknowledged getting depressed and lonely sometimes and became tearful in the office talking of her deceased husband. Performance on a brief cognitive screening instrument (SLUMS) was 24/30. Ultimately, Anita Duke was referred for a comprehensive neuropsychological evaluation to characterize  her cognitive abilities and to assist with diagnostic clarity and treatment planning.  Head CT on 03/02/2020 revealed age-related volume loss and mild chronic small vessel ischemic changes.   Past Medical History:  Diagnosis Date  . Acquired complete AV block    AV node ablation May 28, 2012   . Allergy    mild- uses claritin   . Anemia    Hemoglobin 10.4, December, 2013  . Arthritis    "not bad; little in my hands; some in my knees" (11/04/2013)  . Asthma   . B12 deficiency 03/16/2020  . 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 CAD (coronary artery disease)    LIMA to the LAD at time of mitral valve repair / LIMA atretic,, February, 2011  . Cataracts, bilateral    removed bilat   . 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 CHF (congestive heart failure)   . Chronic systolic dysfunction of left ventricle 05/21/2013  . Clotting disorder    21 yrs ago blood clot in heart   . Colon polyp, hyperplastic   . Diabetes mellitus type II, controlled 03/02/2009  . Diabetic neuropathy 02/21/2018   [facility] - [facility]  . Diverticulosis of colon   . Diverticulosis of large intestine 08/04/2002  . Ejection fraction < 50%    EF 30%, New diagnosis   february, 2011, etiology not clear, consider rate related tachycardia, and not use carvedilol, catheterization no constriction, /    EF 55-60% echo, June, 2011 /     Beta blocker stopped  May, 2012 with reactive airway disease.  Patient does not tolerate metoprolol.  Carvedilol stopped  /   EF 55%, echo, February, 2012  //   EF 40-45%, septal dyssynergy, hypokinesis of the a  . Essential hypertension 07/07/2008  . GERD (gastroesophageal reflux disease)   . Hemorrhoids, internal   . High cholesterol   . History of CABG   . Hyperlipidemia 07/07/2008  . Lumbar radiculopathy, acute 06/17/2018  . Major depressive disorder 07/19/2018  . Mitral regurgitation    a. s/p repair with LAA ligation and MAZE at time of surgery  . Mitral stenosis    Mild, February, 2011, post mitral valve repair / mild, echo, June, 2011  //  Mild functional mitral stenosis, echo, February, 2012  //   mild stenosis, echo, December, 2013  //  mild, echo, August, 2014   //   Mild, echo, April, 2016   . Osteopenia    left hip   . Pacemaker-Medtronic 05/20/2012   Pacemaker placed September, 2013   //   upgraded to biventricular CRT pacemaker November 04, 2013   . Permanent atrial fibrillation    a. s/p MAZE b. s/p AVN ablation  . PONV (postoperative nausea and vomiting)   . Pulmonary HTN   . QT prolongation    Tikosyn and Effexor. QT prolonged October 13, 2011, peak is in dose reduced from 500  to -250 twice a day  . Right ventricular dysfunction    Mild to moderate, echo, February, 2011 / normalized echo, June, 2011 /  RV normal, echo, February, 2012  //   right ventricle reported as good echo, December, 2013   . S/P mitral valve repair    [facility] / Maze procedure/ atrial appendage removed were tied off   . Sick sinus syndrome    s/p Medtronic dual chamber PPM 05/17/12   . Sleep difficulties 09/13/2019  . Vitamin D deficiency 02/20/2014    Past  Surgical History:  Procedure Laterality Date  . ABDOMINAL HYSTERECTOMY  ?2002  . AV NODE ABLATION N/A 05/28/2012   Procedure: AV NODE ABLATION;  Surgeon: Thompson Grayer, MD;  Location: [facility] CATH LAB;  Service: Cardiovascular;  Laterality: N/A;  . BI-VENTRICULAR PACEMAKER  UPGRADE N/A 11/04/2013   upgrade of previously implanted dual chamber pacemaker to STJ Quadra Allure CRTP by Dr Rayann Heman  . BREAST EXCISIONAL BIOPSY Left over 10 years ago   benign  . BREAST SURGERY    . CARDIOVERSION  02/08/2012   Procedure: CARDIOVERSION;  Surgeon: Carlena Bjornstad, MD;  Location: [place];  Service: Cardiovascular;  Laterality: N/A;  . CARDIOVERSION  02/29/2012   Procedure: CARDIOVERSION;  Surgeon: Carlena Bjornstad, MD;  Location: [place];  Service: Cardiovascular;  Laterality: N/A;  . CARDIOVERSION  05/15/2012   Procedure: CARDIOVERSION;  Surgeon: Carlena Bjornstad, MD;  Location: [place];  Service: Cardiovascular;  Laterality: N/A;  . CATARACT EXTRACTION, BILATERAL  2017  . CHOLECYSTECTOMY  1995  . COLON SURGERY  2014   right hemicolectomy   . COLONOSCOPY    . CORONARY ARTERY BYPASS GRAFT  2001   CABG X1 "at time of mitral valve repair" (12/04/2012  . DIAGNOSTIC LAPAROSCOPIC LIVER BIOPSY Left 12/03/2012   Procedure: DIAGNOSTIC LAPAROSCOPIC LIVER BIOPSY;  Surgeon: Stark Klein, MD;  Location: [place];  Service: General;  Laterality: Left;  . EYE SURGERY     bilateral cataract surgery  . LAPAROSCOPIC RIGHT HEMI COLECTOMY  12/03/2012   Procedure: LAPAROSCOPIC RIGHT HEMI COLECTOMY;  Surgeon: Stark Klein, MD;  Location: [place];  Service: General;;  . LUMBAR LAMINECTOMY/ DECOMPRESSION WITH MET-RX Left 06/17/2018   Procedure: left Lumbar three-fourextraforaminal Microdiscectomy with Met-Rx;  Surgeon: Kristeen Miss, MD;  Location: [place];  Service: Neurosurgery;  Laterality: Left;  Marland Kitchen MAZE  2001   w/ MVR & CABG  . MITRAL VALVE REPAIR  2001   "anterior and posterior leaflets" (12/04/2012)  . 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 POLYPECTOMY    . TONSILLECTOMY AND ADENOIDECTOMY  ~ 1950  . UMBILICAL HERNIA REPAIR N/A 12/03/2012   Procedure: HERNIA REPAIR UMBILICAL ADULT;  Surgeon: Stark Klein, MD;  Location: [place];  Service:  General;  Laterality: N/A;    Current Outpatient Medications:  .  ACCU-CHEK SOFTCLIX LANCETS lancets, USE TO CHECK BLOOD SUGARS  TWICE A DAY, Disp: 200 each, Rfl: 2 .  acetaminophen (TYLENOL) 325 MG tablet, Take 650 mg by mouth every 6 (six) hours as needed (pain). , Disp: , Rfl:  .  amoxicillin (AMOXIL) 500 MG capsule, Take 4 tabs prior to dental appointments (Patient not taking: Reported on 02/11/2020), Disp: 30 capsule, Rfl: 0 .  atorvastatin (LIPITOR) 40 MG tablet, Take 1 tablet (40 mg total) by mouth daily., Disp: 90 tablet, Rfl: 3 .  Blood Glucose Monitoring Suppl (ACCU-CHEK AVIVA PLUS) w/Device KIT, Use to check blood sugars daily Dx E11.9, Disp: 1 kit, Rfl: 0 .  buPROPion (WELLBUTRIN XL) 300 MG 24 hr tablet, TAKE 1 TABLET BY MOUTH  DAILY, Disp: 90 tablet, Rfl: 3 .  carvedilol (COREG) 12.5 MG tablet, TAKE 1 TABLET BY MOUTH  TWICE DAILY WITH A MEAL, Disp: 180 tablet, Rfl: 3 .  cholecalciferol (VITAMIN D) 1000 UNITS tablet, Take 1,000 Units by mouth daily. , Disp: , Rfl:  .  fish oil-omega-3 fatty acids 1000 MG capsule, Take 1 g by mouth 2 (two) times daily. , Disp: ,  Rfl:  .  glucose blood (ACCU-CHEK AVIVA PLUS) test strip, USE 2 TIMES DAILY AS  DIRECTED, Disp: 200 each, Rfl: 2 .  JARDIANCE 25 MG TABS tablet, TAKE 1 TABLET BY MOUTH  DAILY, Disp: 90 tablet, Rfl: 1 .  loratadine (CLARITIN) 10 MG tablet, Take 10 mg by mouth daily., Disp: , Rfl:  .  Melatonin 5 MG CAPS, Take by mouth at bedtime. , Disp: , Rfl:  .  potassium chloride (KLOR-CON) 10 MEQ tablet, Take 1 tablet (10 mEq total) daily, along with Lasix, for 2 days only., Disp: 2 tablet, Rfl: 0 .  spironolactone (ALDACTONE) 25 MG tablet, TAKE 1 TABLET BY MOUTH  DAILY, Disp: 90 tablet, Rfl: 3 .  trandolapril (MAVIK) 4 MG tablet, TAKE 1 TABLET BY MOUTH  TWICE DAILY FOR BLOOD  PRESSURE, Disp: 180 tablet, Rfl: 0 .  warfarin (COUMADIN) 2.5 MG tablet, TAKE 1 TABLET BY MOUTH  DAILY EXCEPT TAKE 1.5  TABLETS ON MONDAY,  WEDNESDAY, AND FRIDAY OR AS  DIRECTED BY CLINIC, Disp: 111 tablet, Rfl: 1  Clinical Interview:   Cognitive Symptoms: Decreased short-term memory: Endorsed. She reported a longstanding history of misplacing objects around her home. She also reported two more recent instances where she forgot dinner plans with a good friend of hers and went to the wrong restaurant. She additionally acknowledged trouble remembering the details of previous conversations; however, this was largely attributed to her mind-wandering and not paying well enough attention. Deficits were said to be present for the past one year. She was unsure if they had declined or seemed stable, noting that she is more aware of them now because she is looking for them.  Decreased long-term memory: Denied. Decreased attention/concentration: Endorsed. As mentioned above, she described longstanding weaknesses in sustained attention. It was unclear if these have declined. She reported that since her retirement several years ago, she has not had the same opportunities to test these abilities.  Reduced processing speed: Denied. Difficulties with executive functions: Endorsed. She reported a longstanding weakness with organization, but did acknowledge that these abilities may be "a bit" worse. She denied difficulties with impulsivity or poor judgment. Personality changes were also denied.  Difficulties with emotion regulation: Denied. Difficulties with receptive language: Denied. Difficulties with word finding: Denied. Decreased visuoperceptual ability: Denied.  Difficulties completing ADLs: Denied.  Additional Medical History: History of traumatic brain injury/concussion: Denied. History of stroke: Denied. History of seizure activity: Denied. History of known exposure to toxins: Denied. Symptoms of chronic pain: Denied. Experience of frequent headaches/migraines: Denied. Frequent instances of dizziness/vertigo: Denied.  Sensory changes: She wears glasses with  positive effect. No other sensory changes/difficulties (i.e., hearing, taste, or smell) were reported. Records do suggest a history of bilateral sensorineural hearing loss.  Balance/coordination difficulties: Denied. She also denied a history of falls.  Other motor difficulties: Denied.  Sleep History: Estimated hours obtained each night: Over the past few months, she estimated obtaining 4 hours per night. She reported a longstanding pattern of not requiring 7-8 hours of sleep per night throughout her lifetime.  Difficulties falling asleep: Denied. Difficulties staying asleep: Endorsed. She described waking up around 2-3:00am for largely unknown reasons and then may exhibit difficulties falling back asleep. She was previously taking melatonin supplements; however, these were discontinued due to her feeling that they were ineffective. Trazodone was previously discontinued due to her feeling like her head was "heavy" upon awakening and feeling dizzy.  Feels rested and refreshed upon awakening: Endorsed.  History of snoring: Endorsed. She  also reported often waking with a dry mouth.  History of waking up gasping for air: Denied. Witnessed breath cessation while asleep: Denied.  History of vivid dreaming: Denied. Excessive movement while asleep: Denied. Instances of acting out her dreams: Denied.  Psychiatric/Behavioral Health History: Depression: Endorsed. She reported mild depressive symptoms dating back several years, largely attributed to the passing of her husband. She reported that medication efforts were somewhat helpful at managing these symptoms. Acutely, she described her mood as "awful" and noted increased irritability. She noted that she often feels "mad" at everybody and will catch herself thinking "why do you even care?" Current or remote suicidal ideation, intent, or plan was denied.  Anxiety: Denied. She did acknowledge sporadic periods of increased anxiety. However, these were never  consistently reported to where they would represent a diagnosable condition.  Mania: Denied. Trauma History: Denied. Visual/auditory hallucinations: Denied. Delusional thoughts: Denied.  Tobacco: Denied. Alcohol: She denied current alcohol consumption as well as a history of problematic alcohol abuse or dependence.  Recreational drugs: Denied. Caffeine: Denied. She reported consuming caffeine free sodas on a daily basis.  Family History: Problem Relation Age of Onset  . Heart disease Father   . Diabetes Father   . Heart disease Sister        x 2  . Diabetes Mother   . Kidney disease Mother   . Thyroid disease Mother   . Alzheimer's disease Mother        Symptom onset in late 28s; confirmed via autopsy  . Depression Mother   . Pancreatic cancer Maternal Grandfather   . Diabetes Paternal Grandfather   . Diabetes Paternal Grandmother   . Heart disease Paternal Uncle        x 6  . Prostate cancer Paternal Uncle   . Colon cancer Maternal Aunt   . Colon cancer Maternal Aunt   . Colon cancer Cousin   . Colon polyps Neg Hx   . Adrenal disorder Neg Hx   . Esophageal cancer Neg Hx   . Rectal cancer Neg Hx   . Stomach cancer Neg Hx    This information was confirmed by Anita Duke.  Academic/Vocational History: Highest level of educational attainment: 12 years. She graduated from high school and described herself as an average (B/C) student in academic settings. Math was noted as a relative weakness.  History of developmental delay: Denied. History of grade repetition: Denied. Enrollment in special education courses: Denied. History of LD/ADHD: Denied.  Employment: Retired. 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   Evaluation Results:   Behavioral Observations: Anita Duke was unaccompanied, arrived to her appointment on time, and was appropriately dressed and groomed. She appeared alert and oriented. Observed gait and station were  within normal limits. Gross motor functioning appeared intact upon informal observation and no abnormal movements (e.g., tremors) were noted. Her affect was generally relaxed and positive, but did range appropriately given the subject being discussed during the clinical interview or the task at hand during testing procedures. Spontaneous speech was fluent and word finding difficulties were not observed during the clinical interview. Thought processes were coherent, organized, and normal in content. Insight into her cognitive difficulties appeared adequate. During testing, sustained attention was adequate. However, she was easily distracted at times. Task engagement was adequate and she persisted when challenged. Overall, Anita Duke was cooperative with the clinical interview and subsequent testing procedures.   Adequacy of Effort: The validity of neuropsychological testing is limited  by the extent to which the individual being tested may be assumed to have exerted adequate effort during testing. Ms. Hopple expressed her intention to perform to the best of her abilities and exhibited adequate task engagement and persistence. Scores across stand-alone and embedded performance validity measures were within expectation. As such, the results of the current evaluation are believed to be a valid representation of Ms. Towson's current cognitive functioning.  Test Results: Ms. Ringle was fully oriented at the time of the current evaluation.  Intellectual abilities based upon educational and vocational attainment were estimated to be in the average range. Premorbid abilities were estimated to be within the average range based upon a single-word reading test.   Processing speed was average to above average. Basic attention was average to above average. More complex attention (e.g., working memory) was average. Executive functioning was average to above average. Performance on a task assessing safety and judgment  was exceptionally high.  Assessed receptive language abilities were above average. Likewise, Ms. Parkinson did not exhibit any difficulties comprehending task instructions and answered all questions asked of her appropriately. Sentence repetition was average, while performance on a semantic knowledge screening test was within expectation. Assessed expressive language was mildly variable. Phonemic fluency was below average while semantic fluency was above average and confrontation naming was well above average.     Assessed visuospatial/visuoconstructional abilities were below average to average. Points were lost on her drawing of a clock due to the omission of the number 12, as well as no size difference between clock hands.    Learning (i.e., encoding) of novel verbal information was below average to average. Spontaneous delayed recall (i.e., retrieval) of previously learned information was below average to above average. Retention rates were 100% across a story learning task, 40% across a list learning task, and 29% across a complex figure drawing task. Performance across recognition tasks was below average to above average, suggesting evidence for information consolidation.   Results of emotional screening instruments suggested that recent symptoms of generalized anxiety were in the mild range, while symptoms of depression were within the severe range. A screening instrument assessing recent sleep quality suggested the presence of moderate sleep dysfunction.  Tables of Scores:   Note: This summary of test scores accompanies the interpretive report and should not be considered in isolation without reference to the appropriate sections in the text. Descriptors are based on appropriate normative data and may be adjusted based on clinical judgment. The terms "impaired" and "within normal limits (WNL)" are used when a more specific level of functioning cannot be determined.       Effort Testing:    DESCRIPTOR       Dot Counting Test: --- --- Within Expectation  RBANS Effort Index: --- --- Within Expectation  WAIS-IV Reliable Digit Span: --- --- Within Expectation  D-KEFS Color Word Effort Index: --- --- Within Expectation       Orientation:      Raw Score Percentile   NAB Orientation, Form 1 29/29 --- ---       Cognitive Screening:           Raw Score Percentile   SLUMS: 22/30 --- ---       RBANS, Form A: Standard Score/ Scaled Score Percentile   Total Score 92 30 Average  Immediate Memory 90 25 Average    List Learning 6 9 Below Average    Story Memory 11 63 Average  Visuospatial/Constructional 89 23 Below Average    Figure  Copy 10 50 Average    Line Orientation 14/20 17-25 Below Average to Average  Language 103 58 Average    Picture Naming 10/10 >75 Above Average    Semantic Fluency 10 50 Average  Attention 109 73 Average    Digit Span 12 75 Above Average    Coding 11 63 Average  Delayed Memory 81 10 Below Average    List Recall 2/10 17-25 Below Average to Average    List Recognition 17/20 10-16 Below Average    Story Recall 12 75 Above Average    Story Recognition 12/12 87+ Above Average    Figure Recall 6 9 Below Average    Figure Recognition 4/8 21-38 Average       Intellectual Functioning:           Standard Score Percentile   Test of Premorbid Functioning: 97 42 Average       Attention/Executive Function:          Trail Making Test (TMT): Raw Score (T Score) Percentile     Part A 49 secs.,  0 errors (45) 31 Average    Part B 94 secs.,  1 error (55) 69 Average         Scaled Score Percentile   WAIS-IV Digit Span: 10 50 Average    Forward 11 63 Average    Backward 10 50 Average    Sequencing 9 37 Average       D-KEFS Color-Word Interference Test: Raw Score (Scaled Score) Percentile     Color Naming 27 secs. (14) 91 Above Average    Word Reading 21 secs. (13) 84 Above Average    Inhibition 90 secs. (10) 50 Average      Total Errors 6 errors  (8) 25 Average    Inhibition/Switching 74 secs. (13) 84 Above Average      Total Errors 3 errors (11) 63 Average       D-KEFS 20 Questions Test: Scaled Score Percentile     Total Weighted Achievement Score 14 91 Above Average    Initial Abstraction Score 9 37 Average       NAB Executive Functions Module, Form 1: T Score Percentile     Judgment 81 >99 Exceptionally High       Language:           Raw Score Percentile   PPVT Screening Instrument: 15/16 --- Within Expectation  Sentence Repetition: 14/22 27 Average       Verbal Fluency Test: Raw Score (Scaled Score) Percentile     Phonemic Fluency (CFL) 22 (7) 16 Below Average    Category Fluency 48 (13) 84 Above Average  *Based on [employer] Older Normative Studies (MOANS)          NAB Language Module, Form 1: T Score Percentile     Auditory Comprehension 59 82 Above Average    Naming 31/31 (64) 92 Well Above Average       Visuospatial/Visuoconstruction:      Raw Score Percentile   Clock Drawing: 7/10 --- Within Normal Limits       Mood and Personality:      Raw Score Percentile   Geriatric Depression Scale: 22 --- Severe  Geriatric Anxiety Scale: 19 --- Mild    Somatic 6 --- Mild    Cognitive 6 --- Mild    Affective 7 --- Moderate       Additional Questionnaires:      Raw Score Percentile   PROMIS Sleep Disturbance Questionnaire: 32 ---  Moderate   Informed Consent and Coding/Compliance:   Ms. Lerner was provided with a verbal description of the nature and purpose of the present neuropsychological evaluation. Also reviewed were the foreseeable risks and/or discomforts and benefits of the procedure, limits of confidentiality, and mandatory reporting requirements of this provider. The patient was given the opportunity to ask questions and receive answers about the evaluation. Oral consent to participate was provided by the patient.   This evaluation was conducted by Christia Reading, Ph.D., licensed clinical neuropsychologist. Ms.  Duffy completed a comprehensive clinical interview with Dr. Melvyn Novas, billed as one unit [phone], and 115 minutes of cognitive testing and scoring, billed as one unit [phone] and three additional units [phone]. Psychometrist Milana Kidney, B.S., assisted Dr. Melvyn Novas with test administration and scoring procedures. As a separate and discrete service, Dr. Melvyn Novas spent a total of 120 minutes in interpretation and report writing billed as one unit [phone] and one unit [phone].

## 2020-03-29 ENCOUNTER — Ambulatory Visit (INDEPENDENT_AMBULATORY_CARE_PROVIDER_SITE_OTHER): Payer: Medicare Other | Admitting: Psychology

## 2020-03-29 ENCOUNTER — Other Ambulatory Visit: Payer: Self-pay

## 2020-03-29 DIAGNOSIS — F331 Major depressive disorder, recurrent, moderate: Secondary | ICD-10-CM

## 2020-03-29 NOTE — Patient Instructions (Signed)
Recommendations: A repeat neuropsychological evaluation in 24 months (or sooner if functional decline is noted) is recommended to assess the trajectory of future cognitive decline should it occur. This will also aid in future efforts towards improved diagnostic clarity.  A combination of medication and psychotherapy has been shown to be most effective at treating symptoms of anxiety and depression. As such, Ms. Amster is encouraged to speak with her prescribing physician regarding medication adjustments to optimally manage these symptoms. Likewise, Ms. Burback is encouraged to consider engaging in short-term psychotherapy to address symptoms of psychiatric distress. She would benefit from an active and collaborative therapeutic environment, rather than one purely supportive in nature. Recommended treatment modalities include Cognitive Behavioral Therapy (CBT) or Acceptance and Commitment Therapy (ACT).  She should also discuss options for improving ongoing sleep dysfunction with her PCP. While Trazodone has created negative side effects in the past, there are likely several other medications that Ms. Corti could use in an attempt to improve these symptoms.   Ms. Skalla is encouraged to attend to lifestyle factors for brain health (e.g., regular physical exercise, good nutrition habits, regular participation in cognitively-stimulating activities, and general stress management techniques), which are likely to have benefits for both emotional adjustment and cognition. In fact, in addition to promoting good general health, regular exercise incorporating aerobic activities (e.g., brisk walking, jogging, cycling, etc.) has been demonstrated to be a very effective treatment for depression and stress, with similar efficacy rates to both antidepressant medication and psychotherapy. Optimal control of vascular risk factors (including safe cardiovascular exercise and adherence to dietary recommendations) is  encouraged.  If interested, there are some activities which have therapeutic value and can be useful in keeping her cognitively stimulated. For suggestions, Ms. Engelstad is encouraged to go to the following website: https://www.[facility].org/get-to-know-barrow/centers-programs/neurorehabilitation-center/neuro-rehab-apps-and-games/ which has options, categorized by level of difficulty. It should be noted that these activities should not be viewed as a substitute for therapy.  Because she shows better recall for structured information, she will likely understand and retain new information better if it is presented to her in a meaningful or well-organized manner at the outset, such as grouping items into meaningful categories or presenting information in an outlined, bulleted, or story format.   When learning new information, she would benefit from information being broken up into small, manageable pieces. She may also find it helpful to articulate the material in her own words and in a context to promote encoding at the onset of a new task. This material may need to be repeated multiple times to promote encoding.  Memory can be improved using internal strategies such as rehearsal, repetition, chunking, mnemonics, association, and imagery. External strategies such as written notes in a consistently used memory journal, visual and nonverbal auditory cues such as a calendar on the refrigerator or appointments with alarm, such as on a cell phone, can also help maximize recall.    To address problems with attention in day-to-day interactions, she may wish to consider:   -Avoiding external distractions when needing to concentrate   -Limiting exposure to fast paced environments with multiple sensory demands   -Writing down complicated information and using checklists   -Attempting and completing one task at a time (i.e., no multi-tasking)   -Verbalizing aloud each step of a task to maintain focus    -Reducing the amount of information considered at one time

## 2020-03-29 NOTE — Progress Notes (Signed)
   Neuropsychology Feedback Session Tillie Rung. [facility] of Neurology  Reason for Referral:   Anita Angell Stewartis a 79 y.o. right-handed Caucasian female referred by Ellouise Newer, M.D.,to characterize hercurrent cognitive functioning and assist with diagnostic clarity and treatment planning in the context of subjective cognitive decline.   Feedback:   [name] completed a comprehensive neuropsychological evaluation on 03/22/2020. Please refer to that encounter for the full report and recommendations. Briefly, results suggested neuropsychological functioning largely within normal limits. Somewhat isolated relative weaknesses (i.e., below average normative range) were exhibited across phonemic fluency and both encoding (i.e., learning) and retrieval aspects of memory. Regarding the latter, relative weaknesses were chiefly seen across a list learning task. Performance learning and later retrieving contextualized (i.e., story-based) information was consistently strong. Despite the presence of relative weaknesses, scores in the below average range are still within expectation given estimated premorbid intellectual abilities. Performance was appropriate across all other assessed cognitive domains with scores ranging from the average to exceptionally high normative ranges. Across mood-related questionnaires, [name] reported mild symptoms of anxiety and severe symptoms of depression. She also reported moderate sleep dysfunction. Ongoing mood and sleep concerns can certainly create and/or exacerbate ongoing cognitive dysfunction, especially across domains of processing speed, attention/concentration, executive functioning, and learning and memory. It is quite possible that these factors represent the primary etiology for experienced day-to-day cognitive difficulties.  [name] was unaccompanied. Content of the current session focused on the results of her  neuropsychological evaluation. [name] was given the opportunity to ask questions and her questions were answered. She was encouraged to reach out should additional questions arise. A copy of her report was provided at the conclusion of the visit.      26 minutes were spent conducting the current feedback session with [name], billed as one unit [phone].

## 2020-03-30 ENCOUNTER — Ambulatory Visit (INDEPENDENT_AMBULATORY_CARE_PROVIDER_SITE_OTHER): Payer: Medicare Other | Admitting: *Deleted

## 2020-03-30 ENCOUNTER — Ambulatory Visit (HOSPITAL_COMMUNITY)
Admission: RE | Admit: 2020-03-30 | Discharge: 2020-03-30 | Disposition: A | Discharge status: Home / Self Care | Payer: Medicare Other | Source: Ambulatory Visit | Attending: Cardiology | Admitting: Cardiology

## 2020-03-30 ENCOUNTER — Ambulatory Visit: Payer: Medicare Other | Admitting: General Practice

## 2020-03-30 ENCOUNTER — Encounter (HOSPITAL_COMMUNITY): Payer: Self-pay | Admitting: Cardiology

## 2020-03-30 VITALS — BP 136/70 | HR 70 | Wt 145.4 lb

## 2020-03-30 DIAGNOSIS — Z7984 Long term (current) use of oral hypoglycemic drugs: Secondary | ICD-10-CM | POA: Diagnosis not present

## 2020-03-30 DIAGNOSIS — I4821 Permanent atrial fibrillation: Secondary | ICD-10-CM | POA: Insufficient documentation

## 2020-03-30 DIAGNOSIS — Z950 Presence of cardiac pacemaker: Secondary | ICD-10-CM | POA: Diagnosis not present

## 2020-03-30 DIAGNOSIS — Z8249 Family history of ischemic heart disease and other diseases of the circulatory system: Secondary | ICD-10-CM | POA: Insufficient documentation

## 2020-03-30 DIAGNOSIS — E119 Type 2 diabetes mellitus without complications: Secondary | ICD-10-CM | POA: Diagnosis not present

## 2020-03-30 DIAGNOSIS — Z952 Presence of prosthetic heart valve: Secondary | ICD-10-CM | POA: Insufficient documentation

## 2020-03-30 DIAGNOSIS — I4891 Unspecified atrial fibrillation: Secondary | ICD-10-CM

## 2020-03-30 DIAGNOSIS — Z7901 Long term (current) use of anticoagulants: Secondary | ICD-10-CM | POA: Diagnosis not present

## 2020-03-30 DIAGNOSIS — I2510 Atherosclerotic heart disease of native coronary artery without angina pectoris: Secondary | ICD-10-CM | POA: Diagnosis not present

## 2020-03-30 DIAGNOSIS — Z79899 Other long term (current) drug therapy: Secondary | ICD-10-CM | POA: Insufficient documentation

## 2020-03-30 DIAGNOSIS — I495 Sick sinus syndrome: Secondary | ICD-10-CM

## 2020-03-30 DIAGNOSIS — J449 Chronic obstructive pulmonary disease, unspecified: Secondary | ICD-10-CM | POA: Diagnosis not present

## 2020-03-30 DIAGNOSIS — I340 Nonrheumatic mitral (valve) insufficiency: Secondary | ICD-10-CM | POA: Insufficient documentation

## 2020-03-30 DIAGNOSIS — I5022 Chronic systolic (congestive) heart failure: Secondary | ICD-10-CM | POA: Diagnosis present

## 2020-03-30 DIAGNOSIS — Z85038 Personal history of other malignant neoplasm of large intestine: Secondary | ICD-10-CM | POA: Diagnosis not present

## 2020-03-30 DIAGNOSIS — I519 Heart disease, unspecified: Secondary | ICD-10-CM | POA: Diagnosis not present

## 2020-03-30 DIAGNOSIS — F329 Major depressive disorder, single episode, unspecified: Secondary | ICD-10-CM | POA: Diagnosis not present

## 2020-03-30 DIAGNOSIS — Z8349 Family history of other endocrine, nutritional and metabolic diseases: Secondary | ICD-10-CM | POA: Insufficient documentation

## 2020-03-30 DIAGNOSIS — I272 Pulmonary hypertension, unspecified: Secondary | ICD-10-CM | POA: Insufficient documentation

## 2020-03-30 LAB — POCT INR: INR: 2.1 (ref 2.0–3.0)

## 2020-03-30 NOTE — Progress Notes (Signed)
Advanced Heart Failure Clinic Note    Primary Care: Dr. Billey Gosling Primary Cardiologist: Dr. Aundra Dubin EP: Dr. Rayann Heman   HPI: Anita Duke is a 79 y.o. with a past medical history of pulmonary HTN, mitral regurgitation s/p mitral valve repair, MAZE procedure at the [facility] in 2001, at that time she also had a LIMA to LAD graft placed. Also with history of permanent Afib on warfarin s/p AVN ablation. At one time she was on Tikosyn which was stopped for QT prolongation.  She has chronic systolic CHF (EF 77% in 09/1655).  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 Last cath in 2014 with atretic LIMA to LAD but nonobstructive disease in LAD.   Echo in 10/18 showed EF improved to 55-60% with mild to moderate RV dysfunction.   Echo in 7/20 showed EF 50% with basal inferior and inferolateral akinesis, mildly decreased RV systolic function, s/p MV repair with trivial MR, no MS, normal IVC.   Patient returns for followup of CHF.  Weight is down 3 lbs.  No dyspnea walking on flat ground and no problems with 1 flight of stairs.  No chest pain.  No lightheadedness. No orthopnea/PND.      Labs (5/18): K 4.7, creatinine 0.62, LDL 71, HDL 61 Labs (10/18): K 3.5, creatinine 0.59, TSH normal Labs (7/20): K 3.9, creatinine 0.69, hgb 15.1, LDL 37, TSH normal Labs (7/21): K 3.8, creatinine 0.64, LDL 52, HDL 64  ECG (personally reviewed): atrial fibrillation, BiV pacing  ROS: All systems reviewed and negative except as per HPI.  PMH: 1. Type II diabetes. 2. COPD 3. Depression 4. H/o colon cancer 5. CAD: LIMA-LAD in 4/01 with MV repair.   - LHC (8/14): LIMA-LAD atretic, 50-60% mid LAD stenosis.  6. Mitral regurgitation: S/p MV repair at the [facility] in 4/01.  She also had LAA ligation and MAZE. Echo in 7/20 with trivial MR and no MS.  7. 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   - St Jude CRT-P device.  - Echo (4/16): EF 40%, mild LV dilation, normal RV size with mild to  moderately decreased systolic function, s/p mitral valve repair with mild MR, no significant stenosis.  - Echo (10/18): EF 55-60%, severe LAE, RV with mild to moderate systolic dysfunction, mild-moderate TR, PASP 35 mmHg.  - Echo (7/20): EF 50% with basal inferior and inferolateral akinesis, mildly decreased RV systolic function, s/p MV repair with trivial MR, no MS, normal IVC.  8. Permanent atrial fibrillation: S/p MAZE in 4/01.  S/p AV nodal ablation in 9/13.   9. PFTs (1/19): No significant obstruction or restriction in the lungs.    Current Outpatient Medications  Medication Sig Dispense Refill  . ACCU-CHEK SOFTCLIX LANCETS lancets USE TO CHECK BLOOD SUGARS  TWICE A DAY 200 each 2  . acetaminophen (TYLENOL) 325 MG tablet Take 650 mg by mouth every 6 (six) hours as needed (pain).     Marland Kitchen amoxicillin (AMOXIL) 500 MG capsule Take 4 tabs prior to dental appointments 30 capsule 0  . atorvastatin (LIPITOR) 40 MG tablet Take 1 tablet (40 mg total) by mouth daily. 90 tablet 3  . Blood Glucose Monitoring Suppl (ACCU-CHEK AVIVA PLUS) w/Device KIT Use to check blood sugars daily Dx E11.9 1 kit 0  . buPROPion (WELLBUTRIN XL) 300 MG 24 hr tablet TAKE 1 TABLET BY MOUTH  DAILY 90 tablet 3  . carvedilol (COREG) 12.5 MG tablet TAKE 1 TABLET BY MOUTH  TWICE DAILY WITH A MEAL  180 tablet 3  . cholecalciferol (VITAMIN D) 1000 UNITS tablet Take 1,000 Units by mouth daily.     . fish oil-omega-3 fatty acids 1000 MG capsule Take 1 g by mouth 2 (two) times daily.     Marland Kitchen glucose blood (ACCU-CHEK AVIVA PLUS) test strip USE 2 TIMES DAILY AS  DIRECTED 200 each 2  . JARDIANCE 25 MG TABS tablet TAKE 1 TABLET BY MOUTH  DAILY 90 tablet 1  . loratadine (CLARITIN) 10 MG tablet Take 10 mg by mouth daily.    . Melatonin 5 MG CAPS Take by mouth at bedtime.     Marland Kitchen spironolactone (ALDACTONE) 25 MG tablet TAKE 1 TABLET BY MOUTH  DAILY 90 tablet 3  . trandolapril (MAVIK) 4 MG tablet TAKE 1 TABLET BY MOUTH  TWICE DAILY FOR BLOOD   PRESSURE 180 tablet 0  . warfarin (COUMADIN) 2.5 MG tablet TAKE 1 TABLET BY MOUTH  DAILY EXCEPT TAKE 1.5  TABLETS ON MONDAY,  WEDNESDAY, AND FRIDAY OR AS DIRECTED BY CLINIC 111 tablet 1   No current facility-administered medications for this encounter.    Allergies  Allergen Reactions  . Demerol [Meperidine] Nausea And Vomiting  . Morphine Nausea And Vomiting  . Januvia [Sitagliptin] Other (See Comments)    Headaches, did not feel well  . Metformin And Related Diarrhea  . Tetanus Toxoid Rash and Other (See Comments)    "years ago"      Social History   Socioeconomic History  . Marital status: Widowed    Spouse name: Not on file  . Number of children: 0  . Years of education: 20  . Highest education level: High school graduate  Occupational History  . 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 Smoking status: Never Smoker  . Smokeless tobacco: Never Used  Vaping Use  . Vaping Use: Never used  Substance and Sexual Activity  . Alcohol use: No  . Drug use: No  . Sexual activity: Not Currently    Birth control/protection: Surgical  Other Topics Concern  . Not on file  Social History Narrative      Right handed    Lives alone    Social Determinants of Health   Financial Resource Strain:   . Difficulty of Paying Living Expenses:   Food Insecurity:   . Worried About Charity fundraiser in the Last Year:   . Arboriculturist in the Last Year:   Transportation Needs:   . Film/video editor (Medical):   Marland Kitchen Lack of Transportation (Non-Medical):   Physical Activity:   . Days of Exercise per Week:   . Minutes of Exercise per Session:   Stress:   . Feeling of Stress :   Social Connections:   . Frequency of Communication with Friends and Family:   . Frequency of Social Gatherings with Friends and Family:   . Attends Religious Services:   . Active Member of Clubs or  Organizations:   . Attends Archivist Meetings:   Marland Kitchen Marital Status:   Intimate Partner Violence:   . Fear of Current or Ex-Partner:   . Emotionally Abused:   Marland Kitchen Physically Abused:   . Sexually Abused:       Family History  Problem Relation Age of Onset  . Heart disease Father   . Diabetes Father   . Heart disease Sister  x 2  . Diabetes Mother   . Kidney disease Mother   . Thyroid disease Mother   . Alzheimer's disease Mother        Symptom onset in late 21s; confirmed via autopsy  . Depression Mother   . Pancreatic cancer Maternal Grandfather   . Diabetes Paternal Grandfather   . Diabetes Paternal Grandmother   . Heart disease Paternal Uncle        x 6  . Prostate cancer Paternal Uncle   . Colon cancer Maternal Aunt   . Colon cancer Maternal Aunt   . Colon cancer Cousin   . Colon polyps Neg Hx   . Adrenal disorder Neg Hx   . Esophageal cancer Neg Hx   . Rectal cancer Neg Hx   . Stomach cancer Neg Hx     Vitals:   03/30/20 1003  BP: (!) 136/70  Pulse: 70  SpO2: 97%  Weight: 66 kg (145 lb 6.4 oz)     PHYSICAL EXAM: General: NAD Neck: No JVD, no thyromegaly or thyroid nodule.  Lungs: Clear to auscultation bilaterally with normal respiratory effort. CV: Nondisplaced PMI.  Heart regular S1/S2, no S3/S4, no murmur.  No peripheral edema.  No carotid bruit.  Normal pedal pulses.  Abdomen: Soft, nontender, no hepatosplenomegaly, no distention.  Skin: Intact without lesions or rashes.  Neurologic: Alert and oriented x 3.  Psych: Normal affect. Extremities: No clubbing or cyanosis.  HEENT: Normal.   ASSESSMENT & PLAN: 1. Chronic systolic CHF: EF 35% in 4562. NICM most likely.  She has LIMA-LAD that is atretic, but no flow limiting disease in the LAD.  Cardiomyopathy may be due to RV pacing after AV nodal ablation in 9/13. She now has St Jude CRT-P device and EF was up to 55-60% on 10/18 echo. Echo in 7/20 showed EF about 50%.  NYHA class II symptoms.   She is not volume overloaded on exam or by Corevue.    - Continue Coreg 12.5 mg bid.  - Continue trandolapril 4 mg bid.  - She is doing fine off Lasix, not volume overloaded.   - Continue spironolactone 25 mg daily.   - Repeat echo at followup in 6 months.   2. CAD: LIMA-LAD at time of MV surgery in 4/01, but LIMA-LAD atretic on 8/14 cath.  However, at that time her LAD had moderate stenosis that did not appear flow-limiting (50-60%).  No chest pain.  - Continue atorvastatin 40 mg daily. Good LDL in 7/21. - No ASA given stable CAD with use of warfarin 3. Complete heart block: She had an AV nodal ablation.  Now s/p CRT-P.   4. Permanent atrial fibrillation: Now s/p AV nodal ablation and BiV pacing. - Continue warfarin for anticoagulation.  - We discussed transition to Eliquis, which I think would be ok (data suggests ok to use DOAC with valve repair, bioprosthetic valve).  However, she wants to continue warfarin as she has been on it stably x years.  5. DM: Continue Jardiance.  6. S/p MV repair: Stable mitral valve repair on 7/20 echo.    Followup in 6 months with echo.   Loralie Champagne, MD 03/30/20

## 2020-03-30 NOTE — Patient Instructions (Signed)
Your physician has requested that you have an echocardiogram. Echocardiography is a painless test that uses sound waves to create images of your heart. It provides your doctor with information about the size and shape of your heart and how well your heart's chambers and valves are working. This procedure takes approximately one hour. There are no restrictions for this procedure.   Your physician recommends that you schedule a follow-up appointment in: 6 months with Dr Aundra Dubin with an echo.   Please call office at [phone] option 2 if you have any questions or concerns.    At the [facility], you and your health needs are our priority. As part of our continuing mission to provide you with exceptional heart care, we have created designated Provider Care Teams. These Care Teams include your primary Cardiologist (physician) and Advanced Practice Providers (APPs- Physician Assistants and Nurse Practitioners) who all work together to provide you with the care you need, when you need it.   You may see any of the following providers on your designated Care Team at your next follow up: Marland Kitchen Dr Glori Bickers . Dr Loralie Champagne . Darrick Grinder, NP . Lyda Jester, PA . Audry Riles, PharmD   Please be sure to bring in all your medications bottles to every appointment.

## 2020-03-30 NOTE — Patient Instructions (Addendum)
Pre visit review using our clinic review tool, if applicable. No additional management support is needed unless otherwise documented below in the visit note.  Continue to take 1 tablet daily except 1 1/2 tablets on Mon and Fridays.  Re-check in 4 weeks.  

## 2020-03-30 NOTE — Progress Notes (Signed)
Agree with management.  Rudolph Daoust J Johnnetta Holstine, MD  

## 2020-03-31 LAB — CUP PACEART REMOTE DEVICE CHECK
Battery Remaining Longevity: 65 mo
Battery Remaining Percentage: 71 %
Battery Voltage: 2.92 V
Date Time Interrogation Session: 20210727020015
Implantable Lead Implant Date: 20130913
Implantable Lead Implant Date: 20130913
Implantable Lead Implant Date: 20150303
Implantable Lead Location: 753858
Implantable Lead Location: 753859
Implantable Lead Location: 753860
Implantable Lead Model: 5076
Implantable Lead Model: 5092
Implantable Pulse Generator Implant Date: 20150303
Lead Channel Impedance Value: 410 Ohm
Lead Channel Impedance Value: 440 Ohm
Lead Channel Pacing Threshold Amplitude: 0.75 V
Lead Channel Pacing Threshold Amplitude: 0.875 V
Lead Channel Pacing Threshold Pulse Width: 0.4 ms
Lead Channel Pacing Threshold Pulse Width: 0.6 ms
Lead Channel Sensing Intrinsic Amplitude: 7.6 mV
Lead Channel Setting Pacing Amplitude: 2 V
Lead Channel Setting Pacing Amplitude: 2 V
Lead Channel Setting Pacing Pulse Width: 0.4 ms
Lead Channel Setting Pacing Pulse Width: 0.6 ms
Lead Channel Setting Sensing Sensitivity: 3 mV
Pulse Gen Model: 3242
Pulse Gen Serial Number: 7548835

## 2020-04-05 NOTE — Progress Notes (Signed)
Remote pacemaker transmission.   

## 2020-04-06 ENCOUNTER — Ambulatory Visit (INDEPENDENT_AMBULATORY_CARE_PROVIDER_SITE_OTHER): Payer: Medicare Other

## 2020-04-06 DIAGNOSIS — I519 Heart disease, unspecified: Secondary | ICD-10-CM

## 2020-04-06 DIAGNOSIS — Z950 Presence of cardiac pacemaker: Secondary | ICD-10-CM

## 2020-04-09 NOTE — Progress Notes (Signed)
EPIC Encounter for ICM Monitoring  Patient Name: Anita Duke is a 79 y.o. female Date: 04/09/2020 Primary Care Physican: Binnie Rail, MD Primary Cardiologist:[place] Electrophysiologist: [facility]red Bi-V Pacing: 99% 6/28/2021Weight:147lbs  Transmission reviewed.    CorvueThoracic impedancereturned to normal after taking 2 days of PRN Furosemide.  Prescribed: Off Lasix  Labs: 03/16/2020 Creatinine 0.64, BUN 18, Potassium 3.8, Sodium 142 A complete set of results can be found in Results Review.  Recommendations:No changes.  Follow-up plan: ICM clinic phone appointment on9/13/2021. 91 day device clinic remote transmission10/26/2021.  EP/Cardiology Office Visits: 05/20/2020 with Chanetta Marshall, NP.    Copy of ICM check sent to Dr. Rayann Heman.   3 month ICM trend: 04/05/2020    1 Year ICM trend:       [name], RN 04/09/2020 4:15 PM

## 2020-04-12 ENCOUNTER — Other Ambulatory Visit: Payer: Self-pay | Admitting: Internal Medicine

## 2020-04-20 ENCOUNTER — Other Ambulatory Visit: Payer: Medicare Other

## 2020-04-20 ENCOUNTER — Other Ambulatory Visit: Payer: Self-pay

## 2020-04-20 DIAGNOSIS — Z20822 Contact with and (suspected) exposure to covid-19: Secondary | ICD-10-CM

## 2020-04-21 LAB — SARS-COV-2, NAA 2 DAY TAT

## 2020-04-21 LAB — NOVEL CORONAVIRUS, NAA: SARS-CoV-2, NAA: NOT DETECTED

## 2020-04-24 ENCOUNTER — Other Ambulatory Visit (HOSPITAL_COMMUNITY): Payer: Self-pay | Admitting: Cardiology

## 2020-04-24 ENCOUNTER — Other Ambulatory Visit (HOSPITAL_COMMUNITY): Payer: Self-pay | Admitting: Internal Medicine

## 2020-04-27 ENCOUNTER — Other Ambulatory Visit: Payer: Medicare Other

## 2020-04-29 ENCOUNTER — Ambulatory Visit: Payer: Medicare Other | Admitting: General Practice

## 2020-04-29 ENCOUNTER — Other Ambulatory Visit: Payer: Self-pay

## 2020-04-29 DIAGNOSIS — Z7901 Long term (current) use of anticoagulants: Secondary | ICD-10-CM | POA: Diagnosis not present

## 2020-04-29 DIAGNOSIS — I4891 Unspecified atrial fibrillation: Secondary | ICD-10-CM | POA: Diagnosis not present

## 2020-04-29 LAB — POCT INR: INR: 2.1 (ref 2.0–3.0)

## 2020-04-29 NOTE — Patient Instructions (Addendum)
Pre visit review using our clinic review tool, if applicable. No additional management support is needed unless otherwise documented below in the visit note.  Continue to take 1 tablet daily except 1 1/2 tablets on Mon and Fridays.  Re-check in 4 weeks.

## 2020-04-30 ENCOUNTER — Encounter: Payer: Medicare Other | Admitting: Nurse Practitioner

## 2020-05-01 NOTE — Progress Notes (Signed)
Agree with management.  Shandell Giovanni J Stepahnie Campo, MD  

## 2020-05-09 ENCOUNTER — Other Ambulatory Visit: Payer: Self-pay | Admitting: Internal Medicine

## 2020-05-09 DIAGNOSIS — Z7901 Long term (current) use of anticoagulants: Secondary | ICD-10-CM

## 2020-05-17 ENCOUNTER — Ambulatory Visit (INDEPENDENT_AMBULATORY_CARE_PROVIDER_SITE_OTHER): Payer: Medicare Other

## 2020-05-17 DIAGNOSIS — Z950 Presence of cardiac pacemaker: Secondary | ICD-10-CM | POA: Diagnosis not present

## 2020-05-17 DIAGNOSIS — I519 Heart disease, unspecified: Secondary | ICD-10-CM | POA: Diagnosis not present

## 2020-05-20 ENCOUNTER — Encounter: Payer: Medicare Other | Admitting: Nurse Practitioner

## 2020-05-21 ENCOUNTER — Telehealth: Payer: Self-pay

## 2020-05-21 NOTE — Progress Notes (Signed)
EPIC Encounter for ICM Monitoring  Patient Name: Anita Duke is a 79 y.o. female Date: 05/21/2020 Primary Care Physican: Binnie Rail, MD Primary Cardiologist:Varanasi/McLean Electrophysiologist: Allred Bi-V Pacing: 99% 6/28/2021Weight:147lbs  Attempted call to patient and unable to reach.  Left detailed message per DPR regarding transmission. Transmission reviewed.   CorvueThoracic impedancesuggesting normal fluid levels.  Prescribed: Off Lasix  Labs: 03/16/2020 Creatinine 0.64, BUN 18, Potassium 3.8, Sodium 142 A complete set of results can be found in Results Review.  Recommendations:Left voice mail with ICM number and encouraged to call if experiencing any fluid symptoms.  Follow-up plan: ICM clinic phone appointment on10/18/2021. 91 day device clinic remote transmission10/26/2021.  EP/Cardiology Office Visits:06/17/2020 with Chanetta Marshall, NP.   Copy of ICM check sent to Dr.Allred.    3 month ICM trend: 05/17/2020    1 Year ICM trend:       [name], RN 05/21/2020 3:45 PM

## 2020-05-21 NOTE — Telephone Encounter (Signed)
Remote ICM transmission received.  Attempted call to patient regarding ICM remote transmission and left detailed message per DPR.  Advised to return call for any fluid symptoms or questions. Next ICM remote transmission scheduled 06/21/2020.

## 2020-06-08 ENCOUNTER — Ambulatory Visit (INDEPENDENT_AMBULATORY_CARE_PROVIDER_SITE_OTHER): Payer: Medicare Other | Admitting: General Practice

## 2020-06-08 ENCOUNTER — Other Ambulatory Visit: Payer: Self-pay

## 2020-06-08 DIAGNOSIS — I4891 Unspecified atrial fibrillation: Secondary | ICD-10-CM | POA: Diagnosis not present

## 2020-06-08 DIAGNOSIS — Z7901 Long term (current) use of anticoagulants: Secondary | ICD-10-CM

## 2020-06-08 LAB — POCT INR: INR: 1.5 — AB (ref 2.0–3.0)

## 2020-06-08 NOTE — Patient Instructions (Addendum)
Pre visit review using our clinic review tool, if applicable. No additional management support is needed unless otherwise documented below in the visit note.  Take 2 tablets today (10/5) and take 1 1/2 tablets tomorrow (10/6).  On Thursday continue to take 1 tablet daily except 1 1/2 tablets on Mon and Fridays.  Re-check in 3 weeks.

## 2020-06-10 NOTE — Progress Notes (Signed)
Agree with management.  Amali Uhls J Tejah Brekke, MD  

## 2020-06-11 ENCOUNTER — Telehealth: Payer: Self-pay | Admitting: Internal Medicine

## 2020-06-11 NOTE — Telephone Encounter (Signed)
The Cologuard kit will likely be positive.  That test for 2 things-blood and is a DNA test for cancer.  Having had a colonoscopy recently we know she does not have a recurrence of cancer.  It could be hemorrhoids or something else that the bleeding, especially being on the blood thinner.  We can order the Cologuard-not sure if it would be covered by insurance because she just had a colonoscopy, but I do not think this test is necessarily can help.

## 2020-06-11 NOTE — Telephone Encounter (Signed)
   Patient calling to request order for cologuard kit She has seen some blood in stool on various occasions, recent colonoscopy 02/25/20. Patient declined appointment with GI  Requesting cologuard kit

## 2020-06-15 NOTE — Telephone Encounter (Signed)
Left message for patient to return call to clinic to discuss Dr. Eilleen Kempf response.

## 2020-06-16 NOTE — Progress Notes (Signed)
Electrophysiology Office Note Date: 06/17/2020  ID:  Anita Duke, Arnall 06-Dec-1940, MRN [id number]  PCP: Binnie Rail, MD Primary Cardiologist: Aundra Dubin Electrophysiologist: Allred  CC: Pacemaker follow-up  Anita Duke is a 79 y.o. female seen today for Dr Rayann Heman.  She presents today for routine electrophysiology followup.  Since last being seen in our clinic, the patient reports doing very well.  She denies chest pain, palpitations, dyspnea, PND, orthopnea, nausea, vomiting, dizziness, syncope, edema, weight gain, or early satiety.     Past Medical History:  Diagnosis Date  . Acquired complete AV block    AV node ablation May 28, 2012   . Allergy    mild- uses claritin   . Anemia    Hemoglobin 10.4, December, 2013  . Arthritis    "not bad; little in my hands; some in my knees" (11/04/2013)  . Asthma   . B12 deficiency 03/16/2020  . 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 CAD (coronary artery disease)    LIMA to the LAD at time of mitral valve repair / LIMA atretic,, February, 2011  . Cataracts, bilateral    removed bilat   . 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 CHF (congestive heart failure)   . Chronic systolic dysfunction of left ventricle 05/21/2013  . Clotting disorder    21 yrs ago blood clot in heart   . Colon polyp, hyperplastic   . Diabetes mellitus type II, controlled 03/02/2009  . Diabetic neuropathy 02/21/2018   [facility] - [facility]   . Diverticulosis of colon   . Diverticulosis of large intestine 08/04/2002  . Ejection fraction < 50%    EF 30%, New diagnosis   february, 2011, etiology not clear, consider rate related tachycardia, and not use carvedilol, catheterization no constriction, /    EF 55-60% echo, June, 2011 /     Beta blocker stopped May, 2012 with reactive airway disease.  Patient does not tolerate metoprolol.  Carvedilol stopped  /   EF 55%, echo, February, 2012  //   EF 40-45%, septal dyssynergy, hypokinesis of the a   . Essential hypertension 07/07/2008  . GERD (gastroesophageal reflux disease)   . Hemorrhoids, internal   . High cholesterol   . History of CABG   . Hyperlipidemia 07/07/2008  . Lumbar radiculopathy, acute 06/17/2018  . Major depressive disorder 07/19/2018  . Mitral regurgitation    a. s/p repair with LAA ligation and MAZE at time of surgery  . Mitral stenosis    Mild, February, 2011, post mitral valve repair / mild, echo, June, 2011  //  Mild functional mitral stenosis, echo, February, 2012  //   mild stenosis, echo, December, 2013  //  mild, echo, August, 2014   //   Mild, echo, April, 2016   . Osteopenia    left hip   . Pacemaker-Medtronic 05/20/2012   Pacemaker placed September, 2013   //   upgraded to biventricular CRT pacemaker November 04, 2013   . Permanent atrial fibrillation    a. s/p MAZE b. s/p AVN ablation  . PONV (postoperative nausea and vomiting)   . Pulmonary HTN   . QT prolongation    Tikosyn and Effexor. QT prolonged October 13, 2011, peak is in dose reduced from 500  to -250 twice a day  . Right ventricular dysfunction    Mild to moderate, echo, February, 2011 / normalized echo, June, 2011 /  RV normal, echo, February, 2012  //  right ventricle reported as good echo, December, 2013   . S/P mitral valve repair    [facility] / Maze procedure/ atrial appendage removed were tied off   . Sick sinus syndrome    s/p Medtronic dual chamber PPM 05/17/12   . Sleep difficulties 09/13/2019  . Vitamin D deficiency 02/20/2014   Past Surgical History:  Procedure Laterality Date  . ABDOMINAL HYSTERECTOMY  ?2002  . AV NODE ABLATION N/A 05/28/2012   Procedure: AV NODE ABLATION;  Surgeon: Thompson Grayer, MD;  Location: [facility] CATH LAB;  Service: Cardiovascular;  Laterality: N/A;  . BI-VENTRICULAR PACEMAKER UPGRADE N/A 11/04/2013   upgrade of previously implanted dual chamber pacemaker to STJ Quadra Allure CRTP by Dr Rayann Heman  . BREAST EXCISIONAL BIOPSY Left over 10 years ago   benign  . BREAST  SURGERY    . CARDIOVERSION  02/08/2012   Procedure: CARDIOVERSION;  Surgeon: Carlena Bjornstad, MD;  Location: [place];  Service: Cardiovascular;  Laterality: N/A;  . CARDIOVERSION  02/29/2012   Procedure: CARDIOVERSION;  Surgeon: Carlena Bjornstad, MD;  Location: [place];  Service: Cardiovascular;  Laterality: N/A;  . CARDIOVERSION  05/15/2012   Procedure: CARDIOVERSION;  Surgeon: Carlena Bjornstad, MD;  Location: [place];  Service: Cardiovascular;  Laterality: N/A;  . CATARACT EXTRACTION, BILATERAL  2017  . CHOLECYSTECTOMY  1995  . COLON SURGERY  2014   right hemicolectomy   . COLONOSCOPY    . CORONARY ARTERY BYPASS GRAFT  2001   CABG X1 "at time of mitral valve repair" (12/04/2012  . DIAGNOSTIC LAPAROSCOPIC LIVER BIOPSY Left 12/03/2012   Procedure: DIAGNOSTIC LAPAROSCOPIC LIVER BIOPSY;  Surgeon: Stark Klein, MD;  Location: [place];  Service: General;  Laterality: Left;  . EYE SURGERY     bilateral cataract surgery  . LAPAROSCOPIC RIGHT HEMI COLECTOMY  12/03/2012   Procedure: LAPAROSCOPIC RIGHT HEMI COLECTOMY;  Surgeon: Stark Klein, MD;  Location: [place];  Service: General;;  . LUMBAR LAMINECTOMY/ DECOMPRESSION WITH MET-RX Left 06/17/2018   Procedure: left Lumbar three-fourextraforaminal Microdiscectomy with Met-Rx;  Surgeon: Kristeen Miss, MD;  Location: [place];  Service: Neurosurgery;  Laterality: Left;  Marland Kitchen MAZE  2001   w/ MVR & CABG  . MITRAL VALVE REPAIR  2001   "anterior and posterior leaflets" (12/04/2012)  . 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 POLYPECTOMY    . TONSILLECTOMY AND ADENOIDECTOMY  ~ 1950  . UMBILICAL HERNIA REPAIR N/A 12/03/2012   Procedure: HERNIA REPAIR UMBILICAL ADULT;  Surgeon: Stark Klein, MD;  Location: [place] [facility];  Service: General;  Laterality: N/A;    Current Outpatient Medications  Medication Sig Dispense Refill  . ACCU-CHEK SOFTCLIX LANCETS lancets USE TO CHECK BLOOD SUGARS  TWICE A DAY 200 each 2  .  acetaminophen (TYLENOL) 325 MG tablet Take 650 mg by mouth every 6 (six) hours as needed (pain).     Marland Kitchen amoxicillin (AMOXIL) 500 MG capsule Take 4 tabs prior to dental appointments 30 capsule 0  . atorvastatin (LIPITOR) 40 MG tablet TAKE 1 TABLET BY MOUTH  DAILY 90 tablet 3  . Blood Glucose Monitoring Suppl (ACCU-CHEK AVIVA PLUS) w/Device KIT Use to check blood sugars daily Dx E11.9 1 kit 0  . buPROPion (WELLBUTRIN XL) 300 MG 24 hr tablet TAKE 1 TABLET BY MOUTH  DAILY 90 tablet 3  . carvedilol (COREG) 12.5 MG tablet TAKE 1 TABLET BY MOUTH  TWICE DAILY WITH A MEAL 180  tablet 3  . cholecalciferol (VITAMIN D) 1000 UNITS tablet Take 1,000 Units by mouth daily.     . fish oil-omega-3 fatty acids 1000 MG capsule Take 1 g by mouth 2 (two) times daily.     Marland Kitchen glucose blood (ACCU-CHEK AVIVA PLUS) test strip USE 2 TIMES DAILY AS  DIRECTED 200 each 2  . JARDIANCE 25 MG TABS tablet TAKE 1 TABLET BY MOUTH  DAILY 90 tablet 1  . loratadine (CLARITIN) 10 MG tablet Take 10 mg by mouth daily.    . Melatonin 5 MG CAPS Take by mouth at bedtime.     Marland Kitchen spironolactone (ALDACTONE) 25 MG tablet TAKE 1 TABLET BY MOUTH  DAILY 90 tablet 3  . trandolapril (MAVIK) 4 MG tablet TAKE 1 TABLET BY MOUTH  TWICE DAILY FOR BLOOD  PRESSURE 180 tablet 3  . warfarin (COUMADIN) 2.5 MG tablet Take 1 tablet daily except take 1 1/2 tablets on Mon and Fri or Take as directed by anticoagulation clinic 110 tablet 1   No current facility-administered medications for this visit.    Allergies:   Demerol [meperidine], Morphine, Januvia [sitagliptin], Metformin and related, and Tetanus toxoid   Social History: Social History   Socioeconomic History  . Marital status: Widowed    Spouse name: Not on file  . Number of children: 0  . Years of education: 69  . Highest education level: High school graduate  Occupational History  . 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: [employer]   Tobacco Use  . Smoking status: Never Smoker  . Smokeless tobacco: Never Used  Vaping Use  . Vaping Use: Never used  Substance and Sexual Activity  . Alcohol use: No  . Drug use: No  . Sexual activity: Not Currently    Birth control/protection: Surgical  Other Topics Concern  . Not on file  Social History Narrative      Right handed    Lives alone    Social Determinants of Health   Financial Resource Strain:   . Difficulty of Paying Living Expenses: Not on file  Food Insecurity:   . Worried About Charity fundraiser in the Last Year: Not on file  . Ran Out of Food in the Last Year: Not on file  Transportation Needs:   . Lack of Transportation (Medical): Not on file  . Lack of Transportation (Non-Medical): Not on file  Physical Activity:   . Days of Exercise per Week: Not on file  . Minutes of Exercise per Session: Not on file  Stress:   . Feeling of Stress : Not on file  Social Connections:   . Frequency of Communication with Friends and Family: Not on file  . Frequency of Social Gatherings with Friends and Family: Not on file  . Attends Religious Services: Not on file  . Active Member of Clubs or Organizations: Not on file  . Attends Archivist Meetings: Not on file  . Marital Status: Not on file  Intimate Partner Violence:   . Fear of Current or Ex-Partner: Not on file  . Emotionally Abused: Not on file  . Physically Abused: Not on file  . Sexually Abused: Not on file    Family History: Family History  Problem Relation Age of Onset  . Heart disease Father   . Diabetes Father   . Heart disease Sister        x  2  . Diabetes Mother   . Kidney disease Mother   . Thyroid disease Mother   . Alzheimer's disease Mother        Symptom onset in late 72s; confirmed via autopsy  . Depression Mother   . Pancreatic cancer Maternal Grandfather   . Diabetes Paternal Grandfather   . Diabetes Paternal Grandmother   . Heart disease Paternal Uncle         x 6  . Prostate cancer Paternal Uncle   . Colon cancer Maternal Aunt   . Colon cancer Maternal Aunt   . Colon cancer Cousin   . Colon polyps Neg Hx   . Adrenal disorder Neg Hx   . Esophageal cancer Neg Hx   . Rectal cancer Neg Hx   . Stomach cancer Neg Hx      Review of Systems: All other systems reviewed and are otherwise negative except as noted above.   Physical Exam: VS:  BP 124/68   Pulse 77   Ht 5' 1"  (1.549 m)   Wt 148 lb (67.1 kg)   SpO2 98%   BMI 27.96 kg/m  , BMI Body mass index is 27.96 kg/m.  GEN- The patient is well appearing, alert and oriented x 3 today.   HEENT: normocephalic, atraumatic; sclera clear, conjunctiva pink; hearing intact; oropharynx clear; neck supple  Lungs- Clear to ausculation bilaterally, normal work of breathing.  No wheezes, rales, rhonchi Heart- Regular rate and rhythm (paced) GI- soft, non-tender, non-distended, bowel sounds present  Extremities- no clubbing, cyanosis, or edema  MS- no significant deformity or atrophy Skin- warm and dry, no rash or lesion; PPM pocket well healed Psych- euthymic mood, full affect Neuro- strength and sensation are intact  PPM Interrogation- reviewed in detail today,  See PACEART report  EKG:  EKG is not ordered today.  Recent Labs: 09/15/2019: TSH 1.12 03/16/2020: ALT 18; BUN 18; Creat 0.64; Hemoglobin 14.5; Platelets 193; Potassium 3.8; Sodium 142   Wt Readings from Last 3 Encounters:  06/17/20 148 lb (67.1 kg)  03/30/20 145 lb 6.4 oz (66 kg)  03/16/20 145 lb (65.8 kg)     Other studies Reviewed: Additional studies/ records that were reviewed today include: Dr Rayann Heman and Dr Claris Gladden office notes     Assessment and Plan:  1.  Complete heart block Normal PPM function See Pace Art report No changes today She is pacemaker dependent today   2.  Chronic systolic heart failure EF normalized with CRT Stable No change required today  3.  HTN Stable No change required today  4.   Permanent AF S/p AVN ablation Continue Warfarin for CHADS2VASC of 5     Current medicines are reviewed at length with the patient today.   The patient does not have concerns regarding her medicines.  The following changes were made today:  none  Labs/ tests ordered today include: none Orders Placed This Encounter  Procedures  . CUP PACEART INCLINIC DEVICE CHECK     Disposition:   Follow up with remotes, Dr Rayann Heman 1 year    Signed, Chanetta Marshall, NP 06/17/2020 10:51 AM  [place] [id number] [phone] (office) [phone] (fax)

## 2020-06-17 ENCOUNTER — Encounter: Payer: Self-pay | Admitting: Nurse Practitioner

## 2020-06-17 ENCOUNTER — Ambulatory Visit: Payer: Medicare Other | Admitting: Nurse Practitioner

## 2020-06-17 ENCOUNTER — Other Ambulatory Visit: Payer: Self-pay

## 2020-06-17 VITALS — BP 124/68 | HR 77 | Ht 61.0 in | Wt 148.0 lb

## 2020-06-17 DIAGNOSIS — I519 Heart disease, unspecified: Secondary | ICD-10-CM | POA: Diagnosis not present

## 2020-06-17 DIAGNOSIS — I442 Atrioventricular block, complete: Secondary | ICD-10-CM

## 2020-06-17 DIAGNOSIS — I4821 Permanent atrial fibrillation: Secondary | ICD-10-CM | POA: Diagnosis not present

## 2020-06-17 LAB — CUP PACEART INCLINIC DEVICE CHECK
Date Time Interrogation Session: 20211014104303
Implantable Lead Implant Date: 20130913
Implantable Lead Implant Date: 20130913
Implantable Lead Implant Date: 20150303
Implantable Lead Location: 753858
Implantable Lead Location: 753859
Implantable Lead Location: 753860
Implantable Lead Model: 5076
Implantable Lead Model: 5092
Implantable Pulse Generator Implant Date: 20150303
Pulse Gen Model: 3242
Pulse Gen Serial Number: 7548835

## 2020-06-17 NOTE — Patient Instructions (Signed)
Medication Instructions:  *If you need a refill on your cardiac medications before your next appointment, please call your pharmacy*  Follow-Up: At [facility], you and your health needs are our priority.  As part of our continuing mission to provide you with exceptional heart care, we have created designated Provider Care Teams.  These Care Teams include your primary Cardiologist (physician) and Advanced Practice Providers (APPs -  Physician Assistants and Nurse Practitioners) who all work together to provide you with the care you need, when you need it.  We recommend signing up for the patient portal called "MyChart".  Sign up information is provided on this After Visit Summary.  MyChart is used to connect with patients for Virtual Visits (Telemedicine).  Patients are able to view lab/test results, encounter notes, upcoming appointments, etc.  Non-urgent messages can be sent to your provider as well.   To learn more about what you can do with MyChart, go to [email or web address].    Your next appointment:   Your physician wants you to follow-up in: 1 YEAR with Dr. Rayann Heman.   Remote monitoring is used to monitor your Pacemaker from home. This monitoring reduces the number of office visits required to check your device to one time per year. It allows [name] to keep an eye on the functioning of your device to ensure it is working properly. You are scheduled for a device check from home on 06/21/20. You may send your transmission at any time that day. If you have a wireless device, the transmission will be sent automatically. After your physician reviews your transmission, you will receive a postcard with your next transmission date.  The format for your next appointment:   In Person with Thompson Grayer, MD

## 2020-06-21 ENCOUNTER — Ambulatory Visit (INDEPENDENT_AMBULATORY_CARE_PROVIDER_SITE_OTHER): Payer: Medicare Other

## 2020-06-21 DIAGNOSIS — I5022 Chronic systolic (congestive) heart failure: Secondary | ICD-10-CM

## 2020-06-21 DIAGNOSIS — Z950 Presence of cardiac pacemaker: Secondary | ICD-10-CM

## 2020-06-22 NOTE — Progress Notes (Signed)
EPIC Encounter for ICM Monitoring  Patient Name: Anita Duke is a 79 y.o. female Date: 06/22/2020 Primary Care Physican: Binnie Rail, MD Primary Cardiologist:Varanasi/McLean Electrophysiologist: Allred Bi-V Pacing: 99% 10/14/2021Office Weight:148lbs  Transmission reviewed.   CorvueThoracic impedancesuggesting normal fluid levels.  Prescribed:Off Lasix  Labs: 03/16/2020 Creatinine 0.64, BUN 18, Potassium 3.8, Sodium 142 A complete set of results can be found in Results Review.  Recommendations:No changes.  Follow-up plan: ICM clinic phone appointment on11/22/2021. 91 day device clinic remote transmission10/26/2021.  EP/Cardiology Office Visits:06/20/2021 withDr Allred.   Copy of ICM check sent to Dr.Allred.  3 month ICM trend: 06/21/2020    1 Year ICM trend:       [name], RN 06/22/2020 1:26 PM

## 2020-06-29 ENCOUNTER — Ambulatory Visit (INDEPENDENT_AMBULATORY_CARE_PROVIDER_SITE_OTHER): Payer: Medicare Other

## 2020-06-29 ENCOUNTER — Ambulatory Visit: Payer: Medicare Other

## 2020-06-29 DIAGNOSIS — I495 Sick sinus syndrome: Secondary | ICD-10-CM | POA: Diagnosis not present

## 2020-06-29 LAB — CUP PACEART REMOTE DEVICE CHECK
Battery Remaining Longevity: 59 mo
Battery Remaining Percentage: 63 %
Battery Voltage: 2.9 V
Date Time Interrogation Session: 20211026031632
Implantable Lead Implant Date: 20130913
Implantable Lead Implant Date: 20130913
Implantable Lead Implant Date: 20150303
Implantable Lead Location: 753858
Implantable Lead Location: 753859
Implantable Lead Location: 753860
Implantable Lead Model: 5076
Implantable Lead Model: 5092
Implantable Pulse Generator Implant Date: 20150303
Lead Channel Impedance Value: 410 Ohm
Lead Channel Impedance Value: 450 Ohm
Lead Channel Pacing Threshold Amplitude: 0.75 V
Lead Channel Pacing Threshold Amplitude: 1 V
Lead Channel Pacing Threshold Pulse Width: 0.4 ms
Lead Channel Pacing Threshold Pulse Width: 0.6 ms
Lead Channel Sensing Intrinsic Amplitude: 9.5 mV
Lead Channel Setting Pacing Amplitude: 2 V
Lead Channel Setting Pacing Amplitude: 2 V
Lead Channel Setting Pacing Pulse Width: 0.4 ms
Lead Channel Setting Pacing Pulse Width: 0.6 ms
Lead Channel Setting Sensing Sensitivity: 3 mV
Pulse Gen Model: 3242
Pulse Gen Serial Number: 7548835

## 2020-06-29 LAB — HM DIABETES EYE EXAM

## 2020-06-30 ENCOUNTER — Other Ambulatory Visit: Payer: Self-pay | Admitting: Internal Medicine

## 2020-07-01 ENCOUNTER — Other Ambulatory Visit: Payer: Self-pay

## 2020-07-01 ENCOUNTER — Ambulatory Visit: Payer: Medicare Other | Admitting: General Practice

## 2020-07-01 DIAGNOSIS — Z7901 Long term (current) use of anticoagulants: Secondary | ICD-10-CM | POA: Diagnosis not present

## 2020-07-01 DIAGNOSIS — I4891 Unspecified atrial fibrillation: Secondary | ICD-10-CM | POA: Diagnosis not present

## 2020-07-01 LAB — POCT INR: INR: 2.6 (ref 2.0–3.0)

## 2020-07-01 NOTE — Patient Instructions (Addendum)
Pre visit review using our clinic review tool, if applicable. No additional management support is needed unless otherwise documented below in the visit note.  Continue to take 1 tablet daily except 1 1/2 tablets on Mon and Fridays.  Re-check in 4 to 5 weeks.

## 2020-07-05 NOTE — Progress Notes (Signed)
Remote pacemaker transmission.   

## 2020-07-11 NOTE — Progress Notes (Signed)
Agree with management.  Bosco Paparella J Adan Baehr, MD  

## 2020-07-26 ENCOUNTER — Ambulatory Visit (INDEPENDENT_AMBULATORY_CARE_PROVIDER_SITE_OTHER): Payer: Medicare Other

## 2020-07-26 DIAGNOSIS — I5022 Chronic systolic (congestive) heart failure: Secondary | ICD-10-CM

## 2020-07-26 DIAGNOSIS — Z950 Presence of cardiac pacemaker: Secondary | ICD-10-CM

## 2020-07-28 NOTE — Progress Notes (Signed)
EPIC Encounter for ICM Monitoring  Patient Name: Anita Duke is a 79 y.o. female Date: 07/28/2020 Primary Care Physican: Binnie Rail, MD Primary Cardiologist:Varanasi/McLean Electrophysiologist: Allred Bi-V Pacing: 99% 11/24/2021Office Weight:150lbs  Spoke with patient and reports feeling well at this time.  Denies fluid symptoms.   She is not limiting salt intake and uses salt shaker regularly at meals.    CorvueThoracic impedancesuggesting normal fluid levels but was possible fluid accumulation from 11/14-11/21.  No diuretic  Labs: 03/16/2020 Creatinine 0.64, BUN 18, Potassium 3.8, Sodium 142 A complete set of results can be found in Results Review.  Recommendations:No changes.  Follow-up plan: ICM clinic phone appointment on1/11/2020. 91 day device clinic remote transmission1/25/2022.  EP/Cardiology Office Visits: Recall for 09/26/2020 with Dr Aundra Dubin.  06/20/2021 withDr Allred.   Copy of ICM check sent to Dr.Allred.  3 month ICM trend: 07/26/2020    1 Year ICM trend:       [name], RN 07/28/2020 8:43 AM

## 2020-08-03 ENCOUNTER — Ambulatory Visit: Payer: Medicare Other | Admitting: General Practice

## 2020-08-03 ENCOUNTER — Other Ambulatory Visit: Payer: Self-pay

## 2020-08-03 DIAGNOSIS — I4891 Unspecified atrial fibrillation: Secondary | ICD-10-CM | POA: Diagnosis not present

## 2020-08-03 DIAGNOSIS — Z7901 Long term (current) use of anticoagulants: Secondary | ICD-10-CM

## 2020-08-03 LAB — POCT INR: INR: 2.6 (ref 2.0–3.0)

## 2020-08-03 NOTE — Patient Instructions (Addendum)
Pre visit review using our clinic review tool, if applicable. No additional management support is needed unless otherwise documented below in the visit note.  Continue to take 1 tablet daily except 1 1/2 tablets on Mon and Fridays.  Re-check in 4 to 5 weeks.

## 2020-08-05 NOTE — Progress Notes (Signed)
Agree with management.  Anita Duke Anita Kacee Sukhu, MD  

## 2020-09-06 NOTE — Progress Notes (Signed)
No ICM remote transmission received for 09/06/2020 and next ICM transmission scheduled for 09/14/2020.   

## 2020-09-14 ENCOUNTER — Ambulatory Visit (INDEPENDENT_AMBULATORY_CARE_PROVIDER_SITE_OTHER): Payer: Medicare Other

## 2020-09-14 ENCOUNTER — Ambulatory Visit: Payer: Medicare Other

## 2020-09-14 DIAGNOSIS — Z950 Presence of cardiac pacemaker: Secondary | ICD-10-CM

## 2020-09-14 DIAGNOSIS — I5022 Chronic systolic (congestive) heart failure: Secondary | ICD-10-CM | POA: Diagnosis not present

## 2020-09-15 NOTE — Patient Instructions (Addendum)
Blood work was ordered.     No immunization administered today.    Medications changes include :   none    Please followup in 6months    Health Maintenance, Female Adopting a healthy lifestyle and getting preventive care are important in promoting health and wellness. Ask your health care provider about:  The right schedule for you to have regular tests and exams.  Things you can do on your own to prevent diseases and keep yourself healthy. What should I know about diet, weight, and exercise? Eat a healthy diet  Eat a diet that includes plenty of vegetables, fruits, low-fat dairy products, and lean protein.  Do not eat a lot of foods that are high in solid fats, added sugars, or sodium.   Maintain a healthy weight Body mass index (BMI) is used to identify weight problems. It estimates body fat based on height and weight. Your health care provider can help determine your BMI and help you achieve or maintain a healthy weight. Get regular exercise Get regular exercise. This is one of the most important things you can do for your health. Most adults should:  Exercise for at least 150 minutes each week. The exercise should increase your heart rate and make you sweat (moderate-intensity exercise).  Do strengthening exercises at least twice a week. This is in addition to the moderate-intensity exercise.  Spend less time sitting. Even light physical activity can be beneficial. Watch cholesterol and blood lipids Have your blood tested for lipids and cholesterol at 81 years of age, then have this test every 5 years. Have your cholesterol levels checked more often if:  Your lipid or cholesterol levels are high.  You are older than 81 years of age.  You are at high risk for heart disease. What should I know about cancer screening? Depending on your health history and family history, you may need to have cancer screening at various ages. This may include screening for:  Breast  cancer.  Cervical cancer.  Colorectal cancer.  Skin cancer.  Lung cancer. What should I know about heart disease, diabetes, and high blood pressure? Blood pressure and heart disease  High blood pressure causes heart disease and increases the risk of stroke. This is more likely to develop in people who have high blood pressure readings, are of African descent, or are overweight.  Have your blood pressure checked: ? Every 3-5 years if you are 18-39 years of age. ? Every year if you are 40 years old or older. Diabetes Have regular diabetes screenings. This checks your fasting blood sugar level. Have the screening done:  Once every three years after age 40 if you are at a normal weight and have a low risk for diabetes.  More often and at a younger age if you are overweight or have a high risk for diabetes. What should I know about preventing infection? Hepatitis B If you have a higher risk for hepatitis B, you should be screened for this virus. Talk with your health care provider to find out if you are at risk for hepatitis B infection. Hepatitis C Testing is recommended for:  Everyone born from 1945 through 1965.  Anyone with known risk factors for hepatitis C. Sexually transmitted infections (STIs)  Get screened for STIs, including gonorrhea and chlamydia, if: ? You are sexually active and are younger than 81 years of age. ? You are older than 81 years of age and your health care provider tells you that you are at   at risk for this type of infection. ? Your sexual activity has changed since you were last screened, and you are at increased risk for chlamydia or gonorrhea. Ask your health care provider if you are at risk.  Ask your health care provider about whether you are at high risk for HIV. Your health care provider may recommend a prescription medicine to help prevent HIV infection. If you choose to take medicine to prevent HIV, you should first get tested for HIV. You should  then be tested every 3 months for as long as you are taking the medicine. Pregnancy  If you are about to stop having your period (premenopausal) and you may become pregnant, seek counseling before you get pregnant.  Take 400 to 800 micrograms (mcg) of folic acid every day if you become pregnant.  Ask for birth control (contraception) if you want to prevent pregnancy. Osteoporosis and menopause Osteoporosis is a disease in which the bones lose minerals and strength with aging. This can result in bone fractures. If you are 84 years old or older, or if you are at risk for osteoporosis and fractures, ask your health care provider if you should:  Be screened for bone loss.  Take a calcium or vitamin D supplement to lower your risk of fractures.  Be given hormone replacement therapy (HRT) to treat symptoms of menopause. Follow these instructions at home: Lifestyle  Do not use any products that contain nicotine or tobacco, such as cigarettes, e-cigarettes, and chewing tobacco. If you need help quitting, ask your health care provider.  Do not use street drugs.  Do not share needles.  Ask your health care provider for help if you need support or information about quitting drugs. Alcohol use  Do not drink alcohol if: ? Your health care provider tells you not to drink. ? You are pregnant, may be pregnant, or are planning to become pregnant.  If you drink alcohol: ? Limit how much you use to 0-1 drink a day. ? Limit intake if you are breastfeeding.  Be aware of how much alcohol is in your drink. In the [place] one drink equals one 12 oz bottle of beer (355 mL), one 5 oz glass of wine (148 mL), or one 1 oz glass of hard liquor (44 mL). General instructions  Schedule regular health, dental, and eye exams.  Stay current with your vaccines.  Tell your health care provider if: ? You often feel depressed. ? You have ever been abused or do not feel safe at home. Summary  Adopting a  healthy lifestyle and getting preventive care are important in promoting health and wellness.  Follow your health care provider's instructions about healthy diet, exercising, and getting tested or screened for diseases.  Follow your health care provider's instructions on monitoring your cholesterol and blood pressure. This information is not intended to replace advice given to you by your health care provider. Make sure you discuss any questions you have with your health care provider. Document Revised: 08/14/2018 Document Reviewed: 08/14/2018 Elsevier Patient Education  2021 [employer].

## 2020-09-15 NOTE — Progress Notes (Signed)
Subjective:    Patient ID: Anita Duke, female    DOB: 1941/05/31, 80 y.o.   MRN: [id number]   This visit occurred during the SARS-CoV-2 public health emergency.  Safety protocols were in place, including screening questions prior to the visit, additional usage of staff PPE, and extensive cleaning of exam room while observing appropriate contact time as indicated for disinfecting solutions.    HPI She is here for a physical exam.     Medications and allergies reviewed with patient and updated if appropriate.  Patient Active Problem List   Diagnosis Date Noted  . B12 deficiency 03/16/2020  . Pain in joint of left shoulder 12/30/2019  . Sleep difficulties 09/13/2019  . Depression 07/19/2018  . Lumbar radiculopathy, acute 06/17/2018  . Spinal stenosis of lumbar region 03/27/2018  . Diabetic neuropathy 02/21/2018  . Long term (current) use of anticoagulants 06/01/2017  . 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 Excessive sweating 07/07/2016  . Osteopenia 01/10/2016  . Heat intolerance 10/08/2015  . CAD (coronary artery disease) 05/28/2014  . Vitamin D deficiency 02/20/2014  . Long term current use of anticoagulant therapy 11/24/2013  . Chronic systolic dysfunction of left ventricle 05/21/2013  . Cecal cancer, s/p lap r colectomy 11/20/2012  . Acquired complete AV block   . Sick sinus syndrome ([place])   . Pacemaker-Medtronic 05/20/2012  . Allergic rhinitis 04/25/2012  . Mild intermittent asthma 02/16/2011  . Ejection fraction < 50%   . Atrial fibrillation   . Mitral regurgitation   . Right ventricular dysfunction   . Mitral stenosis   . History of CABG   . Diabetes mellitus type II, controlled 03/02/2009  . Hyperlipidemia 07/07/2008  . Essential hypertension 07/07/2008  . Diverticulosis of large intestine 08/04/2002    Current Outpatient Medications on File Prior to Visit  Medication Sig Dispense Refill  . ACCU-CHEK SOFTCLIX LANCETS lancets USE TO  CHECK BLOOD SUGARS  TWICE A DAY 200 each 2  . acetaminophen (TYLENOL) 325 MG tablet Take 650 mg by mouth every 6 (six) hours as needed (pain).     Marland Kitchen amoxicillin (AMOXIL) 500 MG capsule Take 4 tabs prior to dental appointments 30 capsule 0  . atorvastatin (LIPITOR) 40 MG tablet TAKE 1 TABLET BY MOUTH  DAILY 90 tablet 3  . Blood Glucose Monitoring Suppl (ACCU-CHEK AVIVA PLUS) w/Device KIT Use to check blood sugars daily Dx E11.9 1 kit 0  . buPROPion (WELLBUTRIN XL) 300 MG 24 hr tablet TAKE 1 TABLET BY MOUTH  DAILY 90 tablet 3  . carvedilol (COREG) 12.5 MG tablet TAKE 1 TABLET BY MOUTH  TWICE DAILY WITH A MEAL 180 tablet 3  . cholecalciferol (VITAMIN D) 1000 UNITS tablet Take 1,000 Units by mouth daily.     . fish oil-omega-3 fatty acids 1000 MG capsule Take 1 g by mouth 2 (two) times daily.    Marland Kitchen glucose blood (ACCU-CHEK AVIVA PLUS) test strip USE 2 TIMES DAILY AS  DIRECTED 200 each 2  . JARDIANCE 25 MG TABS tablet TAKE 1 TABLET BY MOUTH  DAILY 90 tablet 3  . loratadine (CLARITIN) 10 MG tablet Take 10 mg by mouth daily.    . Melatonin 5 MG CAPS Take by mouth at bedtime.     Marland Kitchen spironolactone (ALDACTONE) 25 MG tablet TAKE 1 TABLET BY MOUTH  DAILY 90 tablet 3  . trandolapril (MAVIK) 4 MG tablet TAKE 1 TABLET BY MOUTH  TWICE DAILY FOR BLOOD  PRESSURE 180 tablet 3  .  warfarin (COUMADIN) 2.5 MG tablet Take 1 tablet daily except take 1 1/2 tablets on Mon and Fri or Take as directed by anticoagulation clinic 110 tablet 1   No current facility-administered medications on file prior to visit.    Past Medical History:  Diagnosis Date  . Acquired complete AV block    AV node ablation May 28, 2012   . Allergy    mild- uses claritin   . Anemia    Hemoglobin 10.4, December, 2013  . Arthritis    "not bad; little in my hands; some in my knees" (11/04/2013)  . Asthma   . B12 deficiency 03/16/2020  . 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 CAD (coronary artery disease)    LIMA to the LAD at  time of mitral valve repair / LIMA atretic,, February, 2011  . Cataracts, bilateral    removed bilat   . 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 CHF (congestive heart failure)   . Chronic systolic dysfunction of left ventricle 05/21/2013  . Clotting disorder    21 yrs ago blood clot in heart   . Colon polyp, hyperplastic   . Diabetes mellitus type II, controlled 03/02/2009  . Diabetic neuropathy 02/21/2018   [facility] - [facility]  . Diverticulosis of colon   . Diverticulosis of large intestine 08/04/2002  . Ejection fraction < 50%    EF 30%, New diagnosis   february, 2011, etiology not clear, consider rate related tachycardia, and not use carvedilol, catheterization no constriction, /    EF 55-60% echo, June, 2011 /     Beta blocker stopped May, 2012 with reactive airway disease.  Patient does not tolerate metoprolol.  Carvedilol stopped  /   EF 55%, echo, February, 2012  //   EF 40-45%, septal dyssynergy, hypokinesis of the a  . Essential hypertension 07/07/2008  . GERD (gastroesophageal reflux disease)   . Hemorrhoids, internal   . High cholesterol   . History of CABG   . Hyperlipidemia 07/07/2008  . Lumbar radiculopathy, acute 06/17/2018  . Major depressive disorder 07/19/2018  . Mitral regurgitation    a. s/p repair with LAA ligation and MAZE at time of surgery  . Mitral stenosis    Mild, February, 2011, post mitral valve repair / mild, echo, June, 2011  //  Mild functional mitral stenosis, echo, February, 2012  //   mild stenosis, echo, December, 2013  //  mild, echo, August, 2014   //   Mild, echo, April, 2016   . Osteopenia    left hip   . Pacemaker-Medtronic 05/20/2012   Pacemaker placed September, 2013   //   upgraded to biventricular CRT pacemaker November 04, 2013   . Permanent atrial fibrillation    a. s/p MAZE b. s/p AVN ablation  . PONV (postoperative nausea and vomiting)   . Pulmonary HTN   . QT prolongation    Tikosyn and Effexor. QT prolonged October 13, 2011, peak is in dose  reduced from 500  to -250 twice a day  . Right ventricular dysfunction    Mild to moderate, echo, February, 2011 / normalized echo, June, 2011 /  RV normal, echo, February, 2012  //   right ventricle reported as good echo, December, 2013   . S/P mitral valve repair    [facility] / Maze procedure/ atrial appendage removed were tied off   . Sick sinus syndrome    s/p Medtronic dual chamber PPM 05/17/12   .  Sleep difficulties 09/13/2019  . Vitamin D deficiency 02/20/2014    Past Surgical History:  Procedure Laterality Date  . ABDOMINAL HYSTERECTOMY  ?2002  . AV NODE ABLATION N/A 05/28/2012   Procedure: AV NODE ABLATION;  Surgeon: Thompson Grayer, MD;  Location: [facility] CATH LAB;  Service: Cardiovascular;  Laterality: N/A;  . BI-VENTRICULAR PACEMAKER UPGRADE N/A 11/04/2013   upgrade of previously implanted dual chamber pacemaker to STJ Quadra Allure CRTP by Dr Rayann Heman  . BREAST EXCISIONAL BIOPSY Left over 10 years ago   benign  . BREAST SURGERY    . CARDIOVERSION  02/08/2012   Procedure: CARDIOVERSION;  Surgeon: Carlena Bjornstad, MD;  Location: [place];  Service: Cardiovascular;  Laterality: N/A;  . CARDIOVERSION  02/29/2012   Procedure: CARDIOVERSION;  Surgeon: Carlena Bjornstad, MD;  Location: [place];  Service: Cardiovascular;  Laterality: N/A;  . CARDIOVERSION  05/15/2012   Procedure: CARDIOVERSION;  Surgeon: Carlena Bjornstad, MD;  Location: [place];  Service: Cardiovascular;  Laterality: N/A;  . CATARACT EXTRACTION, BILATERAL  2017  . CHOLECYSTECTOMY  1995  . COLON SURGERY  2014   right hemicolectomy   . COLONOSCOPY    . CORONARY ARTERY BYPASS GRAFT  2001   CABG X1 "at time of mitral valve repair" (12/04/2012  . DIAGNOSTIC LAPAROSCOPIC LIVER BIOPSY Left 12/03/2012   Procedure: DIAGNOSTIC LAPAROSCOPIC LIVER BIOPSY;  Surgeon: Stark Klein, MD;  Location: [place];  Service: General;  Laterality: Left;  . EYE SURGERY     bilateral cataract surgery  . LAPAROSCOPIC RIGHT HEMI COLECTOMY  12/03/2012   Procedure:  LAPAROSCOPIC RIGHT HEMI COLECTOMY;  Surgeon: Stark Klein, MD;  Location: [place];  Service: General;;  . LUMBAR LAMINECTOMY/ DECOMPRESSION WITH MET-RX Left 06/17/2018   Procedure: left Lumbar three-fourextraforaminal Microdiscectomy with Met-Rx;  Surgeon: Kristeen Miss, MD;  Location: [place];  Service: Neurosurgery;  Laterality: Left;  Marland Kitchen MAZE  2001   w/ MVR & CABG  . MITRAL VALVE REPAIR  2001   "anterior and posterior leaflets" (12/04/2012)  . 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 POLYPECTOMY    . TONSILLECTOMY AND ADENOIDECTOMY  ~ 1950  . UMBILICAL HERNIA REPAIR N/A 12/03/2012   Procedure: HERNIA REPAIR UMBILICAL ADULT;  Surgeon: Stark Klein, MD;  Location: [place] [facility];  Service: General;  Laterality: N/A;    Social History   Socioeconomic History  . Marital status: Widowed    Spouse name: Not on file  . Number of children: 0  . Years of education: 67  . Highest education level: High school graduate  Occupational History  . 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 Smoking status: Never Smoker  . Smokeless tobacco: Never Used  Vaping Use  . Vaping Use: Never used  Substance and Sexual Activity  . Alcohol use: No  . Drug use: No  . Sexual activity: Not Currently    Birth control/protection: Surgical  Other Topics Concern  . Not on file  Social History Narrative      Right handed    Lives alone    Social Determinants of Health   Financial Resource Strain: Not on file  Food Insecurity: Not on file  Transportation Needs: Not on file  Physical Activity: Not on file  Stress: Not on file  Social Connections: Not on file  Family History  Problem Relation Age of Onset  . Heart disease Father   . Diabetes Father   . Heart disease Sister        x 2  . Diabetes Mother   . Kidney disease Mother   . Thyroid  disease Mother   . Alzheimer's disease Mother        Symptom onset in late 37s; confirmed via autopsy  . Depression Mother   . Pancreatic cancer Maternal Grandfather   . Diabetes Paternal Grandfather   . Diabetes Paternal Grandmother   . Heart disease Paternal Uncle        x 6  . Prostate cancer Paternal Uncle   . Colon cancer Maternal Aunt   . Colon cancer Maternal Aunt   . Colon cancer Cousin   . Colon polyps Neg Hx   . Adrenal disorder Neg Hx   . Esophageal cancer Neg Hx   . Rectal cancer Neg Hx   . Stomach cancer Neg Hx     Review of Systems  Constitutional: Negative for chills and fever.  Eyes: Negative for visual disturbance.  Respiratory: Negative for cough, shortness of breath and wheezing.   Cardiovascular: Negative for chest pain, palpitations and leg swelling.  Gastrointestinal: Negative for abdominal pain, blood in stool, constipation, diarrhea and nausea.       No gerd  Genitourinary: Negative for dysuria and hematuria.  Musculoskeletal: Positive for arthralgias (left shoulder, hips).  Skin: Negative for rash.  Neurological: Negative for light-headedness and headaches.  Psychiatric/Behavioral: Positive for dysphoric mood. The patient is not nervous/anxious.        Objective:   Vitals:   09/16/20 0841  BP: 126/80  Pulse: 60  Temp: 98 F (36.7 C)  SpO2: 95%   Filed Weights   09/16/20 0841  Weight: 152 lb 9.6 oz (69.2 kg)   Body mass index is 28.83 kg/m.  BP Readings from Last 3 Encounters:  09/16/20 126/80  06/17/20 124/68  03/30/20 (!) 136/70    Wt Readings from Last 3 Encounters:  09/16/20 152 lb 9.6 oz (69.2 kg)  06/17/20 148 lb (67.1 kg)  03/30/20 145 lb 6.4 oz (66 kg)     Physical Exam Constitutional: She appears well-developed and well-nourished. No distress.  HENT:  Head: Normocephalic and atraumatic.  Right Ear: External ear normal. Normal ear canal and TM Left Ear: External ear normal.  Normal ear canal and TM Mouth/Throat:  Oropharynx is clear and moist.  Eyes: Conjunctivae and EOM are normal.  Neck: Neck supple. No tracheal deviation present. No thyromegaly present.  No carotid bruit  Cardiovascular: Normal rate, regular rhythm and normal heart sounds.   No murmur heard.  No edema. Pulmonary/Chest: Effort normal and breath sounds normal. No respiratory distress. She has no wheezes. She has no rales.  Breast: deferred   Abdominal: Soft. She exhibits no distension. There is no tenderness.  Lymphadenopathy: She has no cervical adenopathy.  Skin: Skin is warm and dry. She is not diaphoretic.  Psychiatric: She has a normal mood and affect. Her behavior is normal.        Assessment & Plan:   Physical exam: Screening blood work    ordered Immunizations  Had covid x 3, others up to date Colonoscopy   Up to date  Mammogram  Up to date  Gyn  n/a Dexa  Up to date  Eye exams   Up to date  Exercise  Walks dog Weight  [place] for age Substance abuse  none Sees derm     See Problem List for Assessment and Plan of chronic medical problems.

## 2020-09-16 ENCOUNTER — Ambulatory Visit: Payer: Medicare Other | Admitting: General Practice

## 2020-09-16 ENCOUNTER — Ambulatory Visit (INDEPENDENT_AMBULATORY_CARE_PROVIDER_SITE_OTHER): Payer: Medicare Other | Admitting: Internal Medicine

## 2020-09-16 ENCOUNTER — Encounter: Payer: Self-pay | Admitting: Internal Medicine

## 2020-09-16 ENCOUNTER — Other Ambulatory Visit: Payer: Self-pay

## 2020-09-16 VITALS — BP 126/80 | HR 60 | Temp 98.0°F | Ht 61.0 in | Wt 152.6 lb

## 2020-09-16 DIAGNOSIS — Z0000 Encounter for general adult medical examination without abnormal findings: Secondary | ICD-10-CM

## 2020-09-16 DIAGNOSIS — Z7901 Long term (current) use of anticoagulants: Secondary | ICD-10-CM | POA: Diagnosis not present

## 2020-09-16 DIAGNOSIS — E1159 Type 2 diabetes mellitus with other circulatory complications: Secondary | ICD-10-CM

## 2020-09-16 DIAGNOSIS — E559 Vitamin D deficiency, unspecified: Secondary | ICD-10-CM

## 2020-09-16 DIAGNOSIS — M85852 Other specified disorders of bone density and structure, left thigh: Secondary | ICD-10-CM

## 2020-09-16 DIAGNOSIS — I4891 Unspecified atrial fibrillation: Secondary | ICD-10-CM

## 2020-09-16 DIAGNOSIS — E782 Mixed hyperlipidemia: Secondary | ICD-10-CM | POA: Diagnosis not present

## 2020-09-16 DIAGNOSIS — F3289 Other specified depressive episodes: Secondary | ICD-10-CM

## 2020-09-16 DIAGNOSIS — I10 Essential (primary) hypertension: Secondary | ICD-10-CM | POA: Diagnosis not present

## 2020-09-16 DIAGNOSIS — I2510 Atherosclerotic heart disease of native coronary artery without angina pectoris: Secondary | ICD-10-CM

## 2020-09-16 DIAGNOSIS — E1142 Type 2 diabetes mellitus with diabetic polyneuropathy: Secondary | ICD-10-CM

## 2020-09-16 LAB — CBC WITH DIFFERENTIAL/PLATELET
Basophils Absolute: 0.1 10*3/uL (ref 0.0–0.1)
Basophils Relative: 1 % (ref 0.0–3.0)
Eosinophils Absolute: 0.1 10*3/uL (ref 0.0–0.7)
Eosinophils Relative: 1.4 % (ref 0.0–5.0)
HCT: 43.4 % (ref 36.0–46.0)
Hemoglobin: 14.5 g/dL (ref 12.0–15.0)
Lymphocytes Relative: 18.6 % (ref 12.0–46.0)
Lymphs Abs: 1.1 10*3/uL (ref 0.7–4.0)
MCHC: 33.5 g/dL (ref 30.0–36.0)
MCV: 90.6 fl (ref 78.0–100.0)
Monocytes Absolute: 0.7 10*3/uL (ref 0.1–1.0)
Monocytes Relative: 11.3 % (ref 3.0–12.0)
Neutro Abs: 4.2 10*3/uL (ref 1.4–7.7)
Neutrophils Relative %: 67.7 % (ref 43.0–77.0)
Platelets: 191 10*3/uL (ref 150.0–400.0)
RBC: 4.79 Mil/uL (ref 3.87–5.11)
RDW: 14 % (ref 11.5–15.5)
WBC: 6.1 10*3/uL (ref 4.0–10.5)

## 2020-09-16 LAB — COMPREHENSIVE METABOLIC PANEL
ALT: 19 U/L (ref 0–35)
AST: 20 U/L (ref 0–37)
Albumin: 4.6 g/dL (ref 3.5–5.2)
Alkaline Phosphatase: 56 U/L (ref 39–117)
BUN: 19 mg/dL (ref 6–23)
CO2: 30 mEq/L (ref 19–32)
Calcium: 9.8 mg/dL (ref 8.4–10.5)
Chloride: 105 mEq/L (ref 96–112)
Creatinine, Ser: 0.68 mg/dL (ref 0.40–1.20)
GFR: 82.1 mL/min (ref >60.00)
Glucose, Bld: 115 mg/dL — ABNORMAL HIGH (ref 70–99)
Potassium: 4.1 mEq/L (ref 3.5–5.1)
Sodium: 141 mEq/L (ref 135–145)
Total Bilirubin: 1.3 mg/dL — ABNORMAL HIGH (ref 0.2–1.2)
Total Protein: 7 g/dL (ref 6.0–8.3)

## 2020-09-16 LAB — VITAMIN D 25 HYDROXY (VIT D DEFICIENCY, FRACTURES): VITD: 71.58 ng/mL (ref 30.00–100.00)

## 2020-09-16 LAB — POCT INR: INR: 2.4 (ref 2.0–3.0)

## 2020-09-16 LAB — LIPID PANEL
Cholesterol: 122 mg/dL (ref 0–200)
HDL: 60.6 mg/dL (ref >39.00)
LDL Cholesterol: 40 mg/dL (ref 0–99)
NonHDL: 61.56
Total CHOL/HDL Ratio: 2
Triglycerides: 106 mg/dL (ref 0.0–149.0)
VLDL: 21.2 mg/dL (ref 0.0–40.0)

## 2020-09-16 LAB — HEMOGLOBIN A1C: Hgb A1c MFr Bld: 6.8 % — ABNORMAL HIGH (ref 4.6–6.5)

## 2020-09-16 LAB — TSH: TSH: 0.77 u[IU]/mL (ref 0.35–4.50)

## 2020-09-16 NOTE — Assessment & Plan Note (Signed)
Chronic Taking vitamin D daily Check vitamin D level  

## 2020-09-16 NOTE — Assessment & Plan Note (Signed)
Chronic Following with cardiology Asymptomatic Continue carvedilol 12.5 mg twice daily and warfarin per Coumadin clinic CBC, CMP

## 2020-09-16 NOTE — Addendum Note (Signed)
Addended by: Boris Lown B on: 09/16/2020 09:52 AM   Modules accepted: Orders

## 2020-09-16 NOTE — Assessment & Plan Note (Signed)
Chronic Sugars well controlled No complaints

## 2020-09-16 NOTE — Assessment & Plan Note (Signed)
Chronic Has been well controlled Continue Jardiance 25 mg daily Check A1c Continue regular exercise and diabetic diet

## 2020-09-16 NOTE — Assessment & Plan Note (Signed)
Chronic DEXA up-to-date Continue regular exercise Continue vitamin D

## 2020-09-16 NOTE — Assessment & Plan Note (Addendum)
Chronic BP well controlled Continue trandolapril 4 mg twice daily, carvedilol 12.5 mg twice daily cmp

## 2020-09-16 NOTE — Assessment & Plan Note (Signed)
Chronic Controlled, stable Continue bupropion 300 mg daily

## 2020-09-16 NOTE — Assessment & Plan Note (Signed)
Chronic No symptoms consistent with angina Continue regular exercise, healthy diet

## 2020-09-16 NOTE — Patient Instructions (Addendum)
Pre visit review using our clinic review tool, if applicable. No additional management support is needed unless otherwise documented below in the visit note. Continue to take 1 tablet daily except 1 1/2 tablets on Mon and Fridays.  Re-check in 6 weeks.   

## 2020-09-16 NOTE — Assessment & Plan Note (Signed)
Chronic Check lipid panel  Continue atorvastatin 40 mg daily Regular exercise and healthy diet encouraged  

## 2020-09-17 ENCOUNTER — Telehealth: Payer: Self-pay

## 2020-09-17 ENCOUNTER — Ambulatory Visit: Payer: Medicare Other | Admitting: Neurology

## 2020-09-17 ENCOUNTER — Encounter: Payer: Self-pay | Admitting: Neurology

## 2020-09-17 VITALS — BP 159/76 | HR 111 | Resp 20 | Ht 61.0 in | Wt 154.0 lb

## 2020-09-17 DIAGNOSIS — R413 Other amnesia: Secondary | ICD-10-CM | POA: Diagnosis not present

## 2020-09-17 DIAGNOSIS — G479 Sleep disorder, unspecified: Secondary | ICD-10-CM

## 2020-09-17 NOTE — Telephone Encounter (Signed)
Remote ICM transmission received.  Attempted call to patient regarding ICM remote transmission and left detailed message per DPR.  Advised to return call for any fluid symptoms or questions. Next ICM remote transmission scheduled 10/25/2020.

## 2020-09-17 NOTE — Progress Notes (Signed)
EPIC Encounter for ICM Monitoring  Patient Name: Anita Duke is a 80 y.o. female Date: 09/17/2020 Primary Care Physican: Binnie Rail, MD Primary Cardiologist:[facility] Electrophysiologist: Allred Bi-V Pacing: 99% 11/24/2021OfficeWeight:150lbs  Spoke with patient and reports feeling well at this time.  Denies fluid symptoms.    CorvueThoracic impedancesuggesting normal fluid levels.  No diuretic  Labs: 03/16/2020 Creatinine 0.64, BUN 18, Potassium 3.8, Sodium 142 A complete set of results can be found in Results Review.  Recommendations:Left voice mail with ICM number and encouraged to call if experiencing any fluid symptoms..  Follow-up plan: ICM clinic phone appointment on2/21/2022. 91 day device clinic remote transmission1/25/2022.  EP/Cardiology Office Visits: 09/22/2020 with Dr Aundra Dubin.  10/17/2022withDr Allred.   Copy of ICM check sent to Dr.Allred.  3 month ICM trend: 09/17/2020.    1 Year ICM trend:       [name], RN 09/17/2020 10:58 AM

## 2020-09-17 NOTE — Progress Notes (Signed)
Agree with management.  Taetum Flewellen J Umar Patmon, MD  

## 2020-09-17 NOTE — Progress Notes (Signed)
NEUROLOGY FOLLOW UP OFFICE NOTE  Anita Duke [id number] Jan 12, 1941  HISTORY OF PRESENT ILLNESS: I had the pleasure of seeing Anita Duke in follow-up in the neurology clinic on 09/17/2020.  The patient was last seen 7 months ago for memory loss. She is alone in the office today. Records and images were personally reviewed where available.  I personally reviewed head CT without contrast done 02/2020 which did not show any acute changes. There was diffuse volume loss and mild chronic microvascular disease. She underwent Neuropsychological testing in 03/2020 which was largely within normal limits. Her cognitive profile was not suggestive of a neurodegenerative illness. It was noted that she had mild anxiety, severe depression, moderate sleep dysfunction, and that these could affect cognition. Vascular etiology could also be involved.   Since her last visit, she states she does not think her memory is wonderful, but has not worsened. She thinks it is because she is not paying attention. She lives alone in [place] at [place]. She states she has no family left, which worries her about the future. She denies getting lost driving, denies missing medications or bills. She forgets what she went to get in a room. She writes things down. She reports 7 hours of sleep at best, but feels she does not sleep well. She denies daytime drowsiness. She endorses mood issues, she cannot tell the difference with increased dose of Bupropion. She denies any headaches, dizziness, focal numbness/tingling/weakness, no falls.    HIstory on Initial Assessment 02/13/2020: This is an 80 year old right-handed woman with a history of hypertension, hyperlipidemia, pulmonary HTN, atrial fibrillation on Coumadin, s/p PPM, QT prolongation, colon cancer in 2014, mitral regurgitation s/p repair, depression, presenting for evaluation of memory loss. She feels her memory is consistent with age, however a friend that she  sees daily is the one who is concerned. They eat dinner out every night and go to the same restaurant, then one night her friend called her to switch to a different restaurant but she forgot and went to the other one. Her friend then told her that she has been doing little things like that and should be evaluated. She lives alone with no family close by. She lives in independent living at [place]. She drives and denies getting lost driving. She denies missing medications or bill payments. She occasionally misplaces things. She has not left the stove on. Her mother was diagnosed with Alzheimer's disease (by autopsy). She denies any significant head injuries or alcohol use.   She denies any headaches, dizziness, diplopia, dysarthria, neck/back pain, focal numbness/tingling/weakness, bowel/bladder dysfunction, anosmia, or tremors. All her life she has been prone to choking, but this has not been happening recently. She does not sleep well at all, Trazodone caused dizziness. She feels melatonin is helping her, she recently increased to 46m qhs and slept better the past 2 days. She denies any falls. When asked about mood, she states "I'm just a cranky old lady." She gets depressed and lonely sometimes and became tearful in the office talking of her deceased husband. She likes to garden and keep busy. 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   Laboratory Data: Lab Results  Component Value Date   TSH 0.77 09/16/2020   Lab Results  Component Value Date   VVCBSWHQP59 1,638(H) 03/16/2020   Lab Results  Component Value Date   HGBA1C 6.8 (H) 09/16/2020    PAST MEDICAL HISTORY: Past Medical History:  Diagnosis Date  .  Acquired complete AV block    AV node ablation May 28, 2012   . Allergy    mild- uses claritin   . Anemia    Hemoglobin 10.4, December, 2013  . Arthritis    "not bad; little in my hands; some in my knees" (11/04/2013)  . Asthma   . B12 deficiency 03/16/2020  .  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 CAD (coronary artery disease)    LIMA to the LAD at time of mitral valve repair / LIMA atretic,, February, 2011  . Cataracts, bilateral    removed bilat   . 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 CHF (congestive heart failure)   . Chronic systolic dysfunction of left ventricle 05/21/2013  . Clotting disorder    21 yrs ago blood clot in heart   . Colon polyp, hyperplastic   . Diabetes mellitus type II, controlled 03/02/2009  . Diabetic neuropathy 02/21/2018   [facility] - [facility]  . Diverticulosis of colon   . Diverticulosis of large intestine 08/04/2002  . Ejection fraction < 50%    EF 30%, New diagnosis   february, 2011, etiology not clear, consider rate related tachycardia, and not use carvedilol, catheterization no constriction, /    EF 55-60% echo, June, 2011 /     Beta blocker stopped May, 2012 with reactive airway disease.  Patient does not tolerate metoprolol.  Carvedilol stopped  /   EF 55%, echo, February, 2012  //   EF 40-45%, septal dyssynergy, hypokinesis of the a  . Essential hypertension 07/07/2008  . GERD (gastroesophageal reflux disease)   . Hemorrhoids, internal   . High cholesterol   . History of CABG   . Hyperlipidemia 07/07/2008  . Lumbar radiculopathy, acute 06/17/2018  . Major depressive disorder 07/19/2018  . Mitral regurgitation    a. s/p repair with LAA ligation and MAZE at time of surgery  . Mitral stenosis    Mild, February, 2011, post mitral valve repair / mild, echo, June, 2011  //  Mild functional mitral stenosis, echo, February, 2012  //   mild stenosis, echo, December, 2013  //  mild, echo, August, 2014   //   Mild, echo, April, 2016   . Osteopenia    left hip   . Pacemaker-Medtronic 05/20/2012   Pacemaker placed September, 2013   //   upgraded to biventricular CRT pacemaker November 04, 2013   . Permanent atrial fibrillation    a. s/p MAZE b. s/p AVN ablation  . PONV (postoperative nausea and vomiting)   .  Pulmonary HTN   . QT prolongation    Tikosyn and Effexor. QT prolonged October 13, 2011, peak is in dose reduced from 500  to -250 twice a day  . Right ventricular dysfunction    Mild to moderate, echo, February, 2011 / normalized echo, June, 2011 /  RV normal, echo, February, 2012  //   right ventricle reported as good echo, December, 2013   . S/P mitral valve repair    [facility] / Maze procedure/ atrial appendage removed were tied off   . Sick sinus syndrome    s/p Medtronic dual chamber PPM 05/17/12   . Sleep difficulties 09/13/2019  . Vitamin D deficiency 02/20/2014    MEDICATIONS: Current Outpatient Medications on File Prior to Visit  Medication Sig Dispense Refill  . ACCU-CHEK SOFTCLIX LANCETS lancets USE TO CHECK BLOOD SUGARS  TWICE A DAY 200 each 2  . acetaminophen (TYLENOL) 325 MG  tablet Take 650 mg by mouth every 6 (six) hours as needed (pain).     Marland Kitchen atorvastatin (LIPITOR) 40 MG tablet TAKE 1 TABLET BY MOUTH  DAILY 90 tablet 3  . Blood Glucose Monitoring Suppl (ACCU-CHEK AVIVA PLUS) w/Device KIT Use to check blood sugars daily Dx E11.9 1 kit 0  . buPROPion (WELLBUTRIN XL) 300 MG 24 hr tablet TAKE 1 TABLET BY MOUTH  DAILY 90 tablet 3  . carvedilol (COREG) 12.5 MG tablet TAKE 1 TABLET BY MOUTH  TWICE DAILY WITH A MEAL 180 tablet 3  . cholecalciferol (VITAMIN D) 1000 UNITS tablet Take 1,000 Units by mouth daily.     . fish oil-omega-3 fatty acids 1000 MG capsule Take 1 g by mouth 2 (two) times daily.    Marland Kitchen glucose blood (ACCU-CHEK AVIVA PLUS) test strip USE 2 TIMES DAILY AS  DIRECTED 200 each 2  . JARDIANCE 25 MG TABS tablet TAKE 1 TABLET BY MOUTH  DAILY 90 tablet 3  . loratadine (CLARITIN) 10 MG tablet Take 10 mg by mouth daily.    . Melatonin 5 MG CAPS Take by mouth at bedtime.     Marland Kitchen spironolactone (ALDACTONE) 25 MG tablet TAKE 1 TABLET BY MOUTH  DAILY 90 tablet 3  . trandolapril (MAVIK) 4 MG tablet TAKE 1 TABLET BY MOUTH  TWICE DAILY FOR BLOOD  PRESSURE 180 tablet 3  . warfarin  (COUMADIN) 2.5 MG tablet Take 1 tablet daily except take 1 1/2 tablets on Mon and Fri or Take as directed by anticoagulation clinic 110 tablet 1  . amoxicillin (AMOXIL) 500 MG capsule Take 4 tabs prior to dental appointments (Patient not taking: Reported on 09/17/2020) 30 capsule 0   No current facility-administered medications on file prior to visit.    ALLERGIES: Allergies  Allergen Reactions  . Demerol [Meperidine] Nausea And Vomiting  . Morphine Nausea And Vomiting  . Januvia [Sitagliptin] Other (See Comments)    Headaches, did not feel well  . Metformin And Related Diarrhea  . Tetanus Toxoid Rash and Other (See Comments)    "years ago"    FAMILY HISTORY: Family History  Problem Relation Age of Onset  . Heart disease Father   . Diabetes Father   . Heart disease Sister        x 2  . Diabetes Mother   . Kidney disease Mother   . Thyroid disease Mother   . Alzheimer's disease Mother        Symptom onset in late 39s; confirmed via autopsy  . Depression Mother   . Pancreatic cancer Maternal Grandfather   . Diabetes Paternal Grandfather   . Diabetes Paternal Grandmother   . Heart disease Paternal Uncle        x 6  . Prostate cancer Paternal Uncle   . Colon cancer Maternal Aunt   . Colon cancer Maternal Aunt   . Colon cancer Cousin   . Colon polyps Neg Hx   . Adrenal disorder Neg Hx   . Esophageal cancer Neg Hx   . Rectal cancer Neg Hx   . Stomach cancer Neg Hx     SOCIAL HISTORY: Social History   Socioeconomic History  . Marital status: Widowed    Spouse name: Not on file  . Number of children: 0  . Years of education: 70  . Highest education level: High school graduate  Occupational History  . 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 Smoking status: Never Smoker  . Smokeless tobacco: Never Used  Vaping Use  . Vaping Use: Never used  Substance and Sexual  Activity  . Alcohol use: No  . Drug use: No  . Sexual activity: Not Currently    Birth control/protection: Surgical  Other Topics Concern  . Not on file  Social History Narrative   No caffeine   Right handed    Lives alone    Social Determinants of Health   Financial Resource Strain: Not on file  Food Insecurity: Not on file  Transportation Needs: Not on file  Physical Activity: Not on file  Stress: Not on file  Social Connections: Not on file  Intimate Partner Violence: Not on file     PHYSICAL EXAM: Vitals:   09/17/20 0932  BP: (!) 159/76  Pulse: (!) 111  Resp: 20  SpO2: 98%   General: No acute distress Head:  Normocephalic/atraumatic Skin/Extremities: No rash, no edema Neurological Exam: alert and awake.  No aphasia or dysarthria. Fund of knowledge is appropriate.  Attention and concentration are normal.   Cranial nerves: Pupils equal, round. Extraocular movements intact with no nystagmus. Visual fields full.  No facial asymmetry.  Motor: Bulk and tone normal, muscle strength 5/5 throughout with no pronator drift.   Finger to nose testing intact.  Gait narrow-based and steady, no ataxia   IMPRESSION: This is a pleasant 80 yo RH woman with a history of hypertension, hyperlipidemia, pulmonary HTN, atrial fibrillation on Coumadin, s/p PPM, QT prolongation, colon cancer in 2014, mitral regurgitation s/p repair, depression, with memory loss. Head CT no acute changes (unable to do MRI due to PPM). Neuropsychological evaluation in July 2021 was largely within normal limits, we discussed that there is no evidence of dementia at this time. She is worried due to her mother's history and that she is alone with no family any more. We discussed repeating Neuropsychological testing in 2023 as recommended. We discussed the importance of control of vascular risk factors, physical exercise, and brain stimulation exercises for brain health. We discussed how mood and sleep dysfunction can also  affect cognition, she will be scheduled for a home sleep study and instructed to monitor mood and discuss with PCP. Follow-up in 6-8 months, she knows to call for any changes.    Thank you for allowing me to participate in her care.  Please do not hesitate to call for any questions or concerns.   Ellouise Newer, M.D.   CC: Dr. Quay Burow

## 2020-09-17 NOTE — Patient Instructions (Signed)
Good to see you!  1. Schedule home sleep study  2. We will plan for repeat Neurocognitive testing in July 2023  3. Follow-up in 6-8 months, call for any changes.   RECOMMENDATIONS FOR ALL PATIENTS WITH MEMORY PROBLEMS: 1. Continue to exercise (Recommend 30 minutes of walking everyday, or 3 hours every week) 2. Increase social interactions - continue going to [place] and enjoy social gatherings with friends and family 3. Eat healthy, avoid fried foods and eat more fruits and vegetables 4. Maintain adequate blood pressure, blood sugar, and blood cholesterol level. Reducing the risk of stroke and cardiovascular disease also helps promoting better memory. 5. Avoid stressful situations. Live a simple life and avoid aggravations. Organize your time and prepare for the next day in anticipation. 6. Sleep well, avoid any interruptions of sleep and avoid any distractions in the bedroom that may interfere with adequate sleep quality 7. Avoid sugar, avoid sweets as there is a strong link between excessive sugar intake, diabetes, and cognitive impairment We discussed the Mediterranean diet, which has been shown to help patients reduce the risk of progressive memory disorders and reduces cardiovascular risk. This includes eating fish, eat fruits and green leafy vegetables, nuts like almonds and hazelnuts, walnuts, and also use olive oil. Avoid fast foods and fried foods as much as possible. Avoid sweets and sugar as sugar use has been linked to worsening of memory function.

## 2020-09-22 ENCOUNTER — Other Ambulatory Visit: Payer: Self-pay

## 2020-09-22 ENCOUNTER — Encounter (HOSPITAL_COMMUNITY): Payer: Self-pay | Admitting: Cardiology

## 2020-09-22 ENCOUNTER — Ambulatory Visit (HOSPITAL_BASED_OUTPATIENT_CLINIC_OR_DEPARTMENT_OTHER)
Admission: RE | Admit: 2020-09-22 | Discharge: 2020-09-22 | Disposition: A | Discharge status: Home / Self Care | Payer: Medicare Other | Source: Ambulatory Visit | Attending: Cardiology | Admitting: Cardiology

## 2020-09-22 ENCOUNTER — Ambulatory Visit (HOSPITAL_COMMUNITY)
Admission: RE | Admit: 2020-09-22 | Discharge: 2020-09-22 | Disposition: A | Discharge status: Home / Self Care | Payer: Medicare Other | Source: Ambulatory Visit | Attending: Cardiology | Admitting: Cardiology

## 2020-09-22 VITALS — BP 144/80 | HR 69 | Wt 151.6 lb

## 2020-09-22 DIAGNOSIS — I340 Nonrheumatic mitral (valve) insufficiency: Secondary | ICD-10-CM | POA: Insufficient documentation

## 2020-09-22 DIAGNOSIS — I5022 Chronic systolic (congestive) heart failure: Secondary | ICD-10-CM

## 2020-09-22 DIAGNOSIS — Z7984 Long term (current) use of oral hypoglycemic drugs: Secondary | ICD-10-CM | POA: Diagnosis not present

## 2020-09-22 DIAGNOSIS — Z79899 Other long term (current) drug therapy: Secondary | ICD-10-CM | POA: Diagnosis not present

## 2020-09-22 DIAGNOSIS — E119 Type 2 diabetes mellitus without complications: Secondary | ICD-10-CM | POA: Diagnosis not present

## 2020-09-22 DIAGNOSIS — Z950 Presence of cardiac pacemaker: Secondary | ICD-10-CM | POA: Insufficient documentation

## 2020-09-22 DIAGNOSIS — I4821 Permanent atrial fibrillation: Secondary | ICD-10-CM | POA: Diagnosis not present

## 2020-09-22 DIAGNOSIS — Z7901 Long term (current) use of anticoagulants: Secondary | ICD-10-CM | POA: Insufficient documentation

## 2020-09-22 DIAGNOSIS — Z952 Presence of prosthetic heart valve: Secondary | ICD-10-CM | POA: Insufficient documentation

## 2020-09-22 DIAGNOSIS — I442 Atrioventricular block, complete: Secondary | ICD-10-CM | POA: Insufficient documentation

## 2020-09-22 DIAGNOSIS — I272 Pulmonary hypertension, unspecified: Secondary | ICD-10-CM | POA: Diagnosis not present

## 2020-09-22 DIAGNOSIS — I519 Heart disease, unspecified: Secondary | ICD-10-CM

## 2020-09-22 DIAGNOSIS — Z8249 Family history of ischemic heart disease and other diseases of the circulatory system: Secondary | ICD-10-CM | POA: Diagnosis not present

## 2020-09-22 DIAGNOSIS — I2510 Atherosclerotic heart disease of native coronary artery without angina pectoris: Secondary | ICD-10-CM | POA: Insufficient documentation

## 2020-09-22 LAB — ECHOCARDIOGRAM COMPLETE
Calc EF: 41.5 %
MV VTI: 1.36 cm2
S' Lateral: 4 cm
Single Plane A2C EF: 46.3 %
Single Plane A4C EF: 36.1 %

## 2020-09-22 NOTE — Progress Notes (Signed)
Advanced Heart Failure Clinic Note    Primary Care: Dr. Billey Gosling Primary Cardiologist: Dr. Aundra Dubin EP: Dr. Rayann Heman   HPI: Anita Duke is a 80 y.o. with a past medical history of pulmonary HTN, mitral regurgitation s/p mitral valve repair, MAZE procedure at the [facility] in [facility], at that time she also had a LIMA to LAD graft placed. Also with history of permanent Afib on warfarin s/p AVN ablation. At one time she was on Tikosyn which was stopped for QT prolongation.  She has chronic systolic CHF (EF 60% in 09/930).  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 Last cath in 2014 with atretic LIMA to LAD but nonobstructive disease in LAD.   Echo in 10/18 showed EF improved to 55-60% with mild to moderate RV dysfunction.   Echo in 7/20 showed EF 50% with basal inferior and inferolateral akinesis, mildly decreased RV systolic function, s/p MV repair with trivial MR, no MS, normal IVC.  Echo was done today and reviewed, EF 45-50% with basal inferior and basal inferolateral akinesis, mildly decreased RV systolic function, s/p MV repair with trivial MR and mean gradient 4 mmHg.   Patient returns for followup of CHF.  Weight is up about 6 lbs.   She has not been exercising as much as in the past.  She is short of breath if she carries a heavy load.  No dyspnea walking on flat ground.  No chest pain.  BP is elevated but she has not taken her BP meds yet today.    Labs (5/18): K 4.7, creatinine 0.62, LDL 71, HDL 61 Labs (10/18): K 3.5, creatinine 0.59, TSH normal Labs (7/20): K 3.9, creatinine 0.69, hgb 15.1, LDL 37, TSH normal Labs (7/21): K 3.8, creatinine 0.64, LDL 52, HDL 64 Labs (1/22): K 4.1, creatinine 0.68, LDL 40  ECG (personally reviewed): Atrial fibrillation, BiV pacing  ROS: All systems reviewed and negative except as per HPI.  PMH: 1. Type II diabetes. 2. COPD 3. Depression 4. H/o colon cancer 5. CAD: LIMA-LAD in 4/01 with MV repair.   - LHC (8/14): LIMA-LAD atretic,  50-60% mid LAD stenosis.  6. Mitral regurgitation: S/p MV repair at the [facility] in 4/01.  She also had LAA ligation and MAZE. Echo in 7/20 with trivial MR and no MS.  7. 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   - St Jude CRT-P device.  - Echo (4/16): EF 40%, mild LV dilation, normal RV size with mild to moderately decreased systolic function, s/p mitral valve repair with mild MR, no significant stenosis.  - Echo (10/18): EF 55-60%, severe LAE, RV with mild to moderate systolic dysfunction, mild-moderate TR, PASP 35 mmHg.  - Echo (7/20): EF 50% with basal inferior and inferolateral akinesis, mildly decreased RV systolic function, s/p MV repair with trivial MR, no MS, normal IVC.  - Echo (1/22): EF 45-50% with basal inferior and basal inferolateral akinesis, mildly decreased RV systolic function, s/p MV repair with trivial MR and mean gradient 4 mmHg. 8. Permanent atrial fibrillation: S/p MAZE in 4/01.  S/p AV nodal ablation in 9/13.   9. PFTs (1/19): No significant obstruction or restriction in the lungs.    Current Outpatient Medications  Medication Sig Dispense Refill  . ACCU-CHEK SOFTCLIX LANCETS lancets USE TO CHECK BLOOD SUGARS  TWICE A DAY 200 each 2  . acetaminophen (TYLENOL) 325 MG tablet Take 650 mg by mouth every 6 (six) hours as needed (pain).     Marland Kitchen amoxicillin (  AMOXIL) 500 MG capsule Take 4 tabs prior to dental appointments 30 capsule 0  . atorvastatin (LIPITOR) 40 MG tablet TAKE 1 TABLET BY MOUTH  DAILY 90 tablet 3  . Blood Glucose Monitoring Suppl (ACCU-CHEK AVIVA PLUS) w/Device KIT Use to check blood sugars daily Dx E11.9 1 kit 0  . buPROPion (WELLBUTRIN XL) 300 MG 24 hr tablet TAKE 1 TABLET BY MOUTH  DAILY 90 tablet 3  . carvedilol (COREG) 12.5 MG tablet TAKE 1 TABLET BY MOUTH  TWICE DAILY WITH A MEAL 180 tablet 3  . cholecalciferol (VITAMIN D) 1000 UNITS tablet Take 1,000 Units by mouth daily.     . fish oil-omega-3 fatty acids 1000 MG capsule Take 1 g by  mouth 2 (two) times daily.    Marland Kitchen glucose blood (ACCU-CHEK AVIVA PLUS) test strip USE 2 TIMES DAILY AS  DIRECTED 200 each 2  . JARDIANCE 25 MG TABS tablet TAKE 1 TABLET BY MOUTH  DAILY 90 tablet 3  . loratadine (CLARITIN) 10 MG tablet Take 10 mg by mouth daily.    . Melatonin 5 MG CAPS Take by mouth at bedtime.     Marland Kitchen spironolactone (ALDACTONE) 25 MG tablet TAKE 1 TABLET BY MOUTH  DAILY 90 tablet 3  . trandolapril (MAVIK) 4 MG tablet TAKE 1 TABLET BY MOUTH  TWICE DAILY FOR BLOOD  PRESSURE 180 tablet 3  . warfarin (COUMADIN) 2.5 MG tablet Take 1 tablet daily except take 1 1/2 tablets on Mon and Fri or Take as directed by anticoagulation clinic 110 tablet 1   No current facility-administered medications for this encounter.    Allergies  Allergen Reactions  . Demerol [Meperidine] Nausea And Vomiting  . Morphine Nausea And Vomiting  . Januvia [Sitagliptin] Other (See Comments)    Headaches, did not feel well  . Metformin And Related Diarrhea  . Tetanus Toxoid Rash and Other (See Comments)    "years ago"      Social History   Socioeconomic History  . Marital status: Widowed    Spouse name: Not on file  . Number of children: 0  . Years of education: 27  . Highest education level: High school graduate  Occupational History  . 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 Smoking status: Never Smoker  . Smokeless tobacco: Never Used  Vaping Use  . Vaping Use: Never used  Substance and Sexual Activity  . Alcohol use: No  . Drug use: No  . Sexual activity: Not Currently    Birth control/protection: Surgical  Other Topics Concern  . Not on file  Social History Narrative   No caffeine   Right handed    Lives alone    Social Determinants of Health   Financial Resource Strain: Not on file  Food Insecurity: Not on file  Transportation Needs: Not on file  Physical Activity: Not on file   Stress: Not on file  Social Connections: Not on file  Intimate Partner Violence: Not on file      Family History  Problem Relation Age of Onset  . Heart disease Father   . Diabetes Father   . Heart disease Sister        x 2  . Diabetes Mother   . Kidney disease Mother   . Thyroid disease Mother   . Alzheimer's disease Mother        Symptom  onset in late 71s; confirmed via autopsy  . Depression Mother   . Pancreatic cancer Maternal Grandfather   . Diabetes Paternal Grandfather   . Diabetes Paternal Grandmother   . Heart disease Paternal Uncle        x 6  . Prostate cancer Paternal Uncle   . Colon cancer Maternal Aunt   . Colon cancer Maternal Aunt   . Colon cancer Cousin   . Colon polyps Neg Hx   . Adrenal disorder Neg Hx   . Esophageal cancer Neg Hx   . Rectal cancer Neg Hx   . Stomach cancer Neg Hx     Vitals:   09/22/20 0912  BP: (!) 144/80  Pulse: 69  SpO2: 98%  Weight: 68.8 kg (151 lb 9.6 oz)     PHYSICAL EXAM: General: NAD Neck: No JVD, no thyromegaly or thyroid nodule.  Lungs: Clear to auscultation bilaterally with normal respiratory effort. CV: Nondisplaced PMI.  Heart regular S1/S2, no S3/S4, no murmur.  No peripheral edema.  No carotid bruit.  Normal pedal pulses.  Abdomen: Soft, nontender, no hepatosplenomegaly, no distention.  Skin: Intact without lesions or rashes.  Neurologic: Alert and oriented x 3.  Psych: Normal affect. Extremities: No clubbing or cyanosis.  HEENT: Normal.   ASSESSMENT & PLAN: 1. Chronic systolic CHF: EF 03% in 8882. NICM most likely.  She has LIMA-LAD that is atretic, but no flow limiting disease in the LAD.  Cardiomyopathy may be due to RV pacing after AV nodal ablation in 9/13. She now has St Jude CRT-P device and EF was up to 55-60% on 10/18 echo. Echo in 7/20 showed EF about 50%.  Echo done today and reviewed showed stable EF 45-50%.  NYHA class II symptoms.  She is not volume overloaded on exam.  - Continue Coreg 12.5  mg bid.  - Continue Jardiance.  - Continue trandolapril 4 mg bid.  - She is doing fine off Lasix, not volume overloaded.   - Continue spironolactone 25 mg daily.  Recent BMET was stable.  2. CAD: LIMA-LAD at time of MV surgery in 4/01, but LIMA-LAD atretic on 8/14 cath.  However, at that time her LAD had moderate stenosis that did not appear flow-limiting (50-60%).  No chest pain.  - Continue atorvastatin 40 mg daily. Good LDL in 1/22.  - No ASA given stable CAD with use of warfarin 3. Complete heart block: She had an AV nodal ablation.  Now s/p CRT-P.   4. Permanent atrial fibrillation: Now s/p AV nodal ablation and BiV pacing. - Continue warfarin for anticoagulation.  - We have discussed transition to Eliquis, which I think would be ok (data suggests ok to use DOAC with valve repair, bioprosthetic valve).  However, she wants to continue warfarin as she has been on it stably x years.  5. DM: Continue Jardiance.  6. S/p MV repair: Stable mitral valve repair on today's echo.    Followup in 6 months.   Loralie Champagne, MD 09/22/20

## 2020-09-22 NOTE — Patient Instructions (Addendum)
Please call our office in June 2022 to schedule your follow up appointment  If you have any questions or concerns before your next appointment please send us a message through mychart or call our office at [phone].    TO LEAVE A MESSAGE FOR THE NURSE SELECT OPTION 2, PLEASE LEAVE A MESSAGE INCLUDING: . YOUR NAME . DATE OF BIRTH . CALL BACK NUMBER . REASON FOR CALL**this is important as we prioritize the call backs  YOU WILL RECEIVE A CALL BACK THE SAME DAY AS LONG AS YOU CALL BEFORE 4:00 PM  

## 2020-09-22 NOTE — Progress Notes (Signed)
  Echocardiogram 2D Echocardiogram has been performed.  Anita Duke 09/22/2020, 9:04 AM

## 2020-09-28 ENCOUNTER — Ambulatory Visit (INDEPENDENT_AMBULATORY_CARE_PROVIDER_SITE_OTHER): Payer: Medicare Other

## 2020-09-28 DIAGNOSIS — I495 Sick sinus syndrome: Secondary | ICD-10-CM

## 2020-09-28 LAB — CUP PACEART REMOTE DEVICE CHECK
Battery Remaining Longevity: 47 mo
Battery Remaining Percentage: 50 %
Battery Voltage: 2.87 V
Date Time Interrogation Session: 20220125020025
Implantable Lead Implant Date: 20130913
Implantable Lead Implant Date: 20130913
Implantable Lead Implant Date: 20150303
Implantable Lead Location: 753858
Implantable Lead Location: 753859
Implantable Lead Location: 753860
Implantable Lead Model: 5076
Implantable Lead Model: 5092
Implantable Pulse Generator Implant Date: 20150303
Lead Channel Impedance Value: 410 Ohm
Lead Channel Impedance Value: 460 Ohm
Lead Channel Pacing Threshold Amplitude: 0.875 V
Lead Channel Pacing Threshold Amplitude: 0.875 V
Lead Channel Pacing Threshold Pulse Width: 0.4 ms
Lead Channel Pacing Threshold Pulse Width: 0.6 ms
Lead Channel Sensing Intrinsic Amplitude: 8.5 mV
Lead Channel Setting Pacing Amplitude: 2 V
Lead Channel Setting Pacing Amplitude: 2 V
Lead Channel Setting Pacing Pulse Width: 0.4 ms
Lead Channel Setting Pacing Pulse Width: 0.6 ms
Lead Channel Setting Sensing Sensitivity: 3 mV
Pulse Gen Model: 3242
Pulse Gen Serial Number: 7548835

## 2020-10-05 ENCOUNTER — Other Ambulatory Visit: Payer: Self-pay | Admitting: Internal Medicine

## 2020-10-05 DIAGNOSIS — Z7901 Long term (current) use of anticoagulants: Secondary | ICD-10-CM

## 2020-10-09 NOTE — Progress Notes (Signed)
Remote pacemaker transmission.   

## 2020-10-15 ENCOUNTER — Encounter (HOSPITAL_BASED_OUTPATIENT_CLINIC_OR_DEPARTMENT_OTHER): Payer: Medicare Other | Admitting: Internal Medicine

## 2020-10-25 ENCOUNTER — Ambulatory Visit (INDEPENDENT_AMBULATORY_CARE_PROVIDER_SITE_OTHER): Payer: Medicare Other

## 2020-10-25 DIAGNOSIS — I5022 Chronic systolic (congestive) heart failure: Secondary | ICD-10-CM

## 2020-10-25 DIAGNOSIS — Z950 Presence of cardiac pacemaker: Secondary | ICD-10-CM

## 2020-10-28 ENCOUNTER — Ambulatory Visit: Payer: Medicare Other

## 2020-11-02 ENCOUNTER — Other Ambulatory Visit: Payer: Self-pay

## 2020-11-02 ENCOUNTER — Ambulatory Visit: Payer: Medicare Other | Admitting: General Practice

## 2020-11-02 DIAGNOSIS — Z7901 Long term (current) use of anticoagulants: Secondary | ICD-10-CM | POA: Diagnosis not present

## 2020-11-02 DIAGNOSIS — I4891 Unspecified atrial fibrillation: Secondary | ICD-10-CM

## 2020-11-02 LAB — POCT INR: INR: 1.9 — AB (ref 2.0–3.0)

## 2020-11-02 NOTE — Progress Notes (Signed)
Agree with management.  Totiana Everson J Marsha Hillman, MD  

## 2020-11-02 NOTE — Progress Notes (Signed)
EPIC Encounter for ICM Monitoring  Patient Name: Anita Duke is a 80 y.o. female Date: 11/02/2020 Primary Care Physican: Binnie Rail, MD Primary Cardiologist:[facility] Electrophysiologist: Allred Bi-V Pacing: 98% 09/21/2020 OfficeWeight:151lbs  Transmission reviewed.  CorvueThoracic impedancesuggesting normal fluid levels.  No diuretic  Labs: 03/16/2020 Creatinine 0.64, BUN 18, Potassium 3.8, Sodium 142 A complete set of results can be found in Results Review.  Recommendations:No changes.  Follow-up plan: ICM clinic phone appointment on4/12/2020. 91 day device clinic remote transmission4/26/2022.  EP/Cardiology Office Visits: Recall 03/21/2021 with Dr Aundra Dubin.10/17/2022withDr Allred.   Copy of ICM check sent to Dr.Allred.  3 month ICM trend: 10/25/2020.    1 Year ICM trend:       [name], RN 11/02/2020 1:55 PM

## 2020-11-02 NOTE — Patient Instructions (Addendum)
Pre visit review using our clinic review tool, if applicable. No additional management support is needed unless otherwise documented below in the visit note.  Take 1 1/2 tablets today (3/1) and then continue to take 1 tablet daily except 1 1/2 tablets on Mon and Fridays.  Re-check in 4 weeks.

## 2020-11-24 DIAGNOSIS — M79642 Pain in left hand: Secondary | ICD-10-CM | POA: Insufficient documentation

## 2020-11-24 DIAGNOSIS — M79641 Pain in right hand: Secondary | ICD-10-CM | POA: Insufficient documentation

## 2020-11-30 ENCOUNTER — Other Ambulatory Visit: Payer: Self-pay

## 2020-11-30 ENCOUNTER — Ambulatory Visit: Payer: Medicare Other | Admitting: General Practice

## 2020-11-30 DIAGNOSIS — Z7901 Long term (current) use of anticoagulants: Secondary | ICD-10-CM

## 2020-11-30 DIAGNOSIS — I4891 Unspecified atrial fibrillation: Secondary | ICD-10-CM | POA: Diagnosis not present

## 2020-11-30 LAB — POCT INR: INR: 2.4 (ref 2.0–3.0)

## 2020-11-30 NOTE — Patient Instructions (Addendum)
Pre visit review using our clinic review tool, if applicable. No additional management support is needed unless otherwise documented below in the visit note. Continue to take 1 tablet daily except 1 1/2 tablets on Mon and Fridays.  Re-check in 6 weeks.

## 2020-11-30 NOTE — Progress Notes (Signed)
Agree with management.  Karis Rilling J Vicenta Olds, MD  

## 2020-12-02 ENCOUNTER — Telehealth: Payer: Self-pay | Admitting: Internal Medicine

## 2020-12-02 ENCOUNTER — Other Ambulatory Visit: Payer: Self-pay | Admitting: Internal Medicine

## 2020-12-02 MED ORDER — AMOXICILLIN 500 MG PO CAPS: ORAL_CAPSULE | ORAL | 0 refills | Status: DC

## 2020-12-02 NOTE — Addendum Note (Signed)
Addended by: Binnie Rail on: 12/02/2020 04:18 PM   Modules accepted: Orders

## 2020-12-02 NOTE — Telephone Encounter (Signed)
amoxicillin (AMOXIL) 500 MG capsule [place], [place] Phone:  [phone]  Fax:  [phone]     Requesting refill Last seen- 01.13.22 Next apt- 07.13.22

## 2020-12-04 ENCOUNTER — Other Ambulatory Visit (HOSPITAL_COMMUNITY): Payer: Self-pay

## 2020-12-04 MED FILL — Amoxicillin (Trihydrate) Cap 500 MG: ORAL | 10 days supply | Qty: 40 | Fill #0 | Status: AC

## 2020-12-06 ENCOUNTER — Ambulatory Visit (INDEPENDENT_AMBULATORY_CARE_PROVIDER_SITE_OTHER): Payer: Medicare Other

## 2020-12-06 ENCOUNTER — Other Ambulatory Visit (HOSPITAL_COMMUNITY): Payer: Self-pay

## 2020-12-06 DIAGNOSIS — Z950 Presence of cardiac pacemaker: Secondary | ICD-10-CM

## 2020-12-06 DIAGNOSIS — I5022 Chronic systolic (congestive) heart failure: Secondary | ICD-10-CM

## 2020-12-07 ENCOUNTER — Other Ambulatory Visit (HOSPITAL_COMMUNITY): Payer: Self-pay

## 2020-12-07 NOTE — Progress Notes (Signed)
EPIC Encounter for ICM Monitoring  Patient Name: Anita Duke is a 80 y.o. female Date: 12/07/2020 Primary Care Physican: Binnie Rail, MD Primary Cardiologist:Varanasi/McLean Electrophysiologist: Allred Bi-V Pacing: 97% 12/07/2020 Weight:151lbs  Spoke with patient and reports feeling well at this time.  Denies fluid symptoms.  [place] and knows the fluid retention was related to foods that were higher in salt.   CorvueThoracic impedancenormal but was suggesting possible fluid accumulation 3/26-4/2 and 3/9-3/18.  Prescribed: Spironolactone 25 mg take 1 tablet daily  Labs: 03/16/2020 Creatinine 0.64, BUN 18, Potassium 3.8, Sodium 142 A complete set of results can be found in Results Review.  Recommendations:  Recommendation to limit salt intake to 2000 mg daily and fluid intake to 64 oz daily.  Encouraged to call if experiencing any fluid symptoms.   Follow-up plan: ICM clinic phone appointment on5/01/2021. 91 day device clinic remote transmission4/26/2022.  EP/Cardiology Office Visits: Recall 03/21/2021 with Dr Aundra Dubin.10/17/2022withDr Allred.   Copy of ICM check sent to Dr.Allred.  3 month ICM trend: 12/06/2020.    1 Year ICM trend:       [name], RN 12/07/2020 9:21 AM

## 2020-12-28 ENCOUNTER — Other Ambulatory Visit: Payer: Self-pay | Admitting: Internal Medicine

## 2020-12-28 ENCOUNTER — Ambulatory Visit (INDEPENDENT_AMBULATORY_CARE_PROVIDER_SITE_OTHER): Payer: Medicare Other

## 2020-12-28 DIAGNOSIS — I495 Sick sinus syndrome: Secondary | ICD-10-CM | POA: Diagnosis not present

## 2020-12-28 DIAGNOSIS — Z1231 Encounter for screening mammogram for malignant neoplasm of breast: Secondary | ICD-10-CM

## 2020-12-28 LAB — CUP PACEART REMOTE DEVICE CHECK
Battery Remaining Longevity: 41 mo
Battery Remaining Percentage: 44 %
Battery Voltage: 2.86 V
Date Time Interrogation Session: 20220426020014
Implantable Lead Implant Date: 20130913
Implantable Lead Implant Date: 20130913
Implantable Lead Implant Date: 20150303
Implantable Lead Location: 753858
Implantable Lead Location: 753859
Implantable Lead Location: 753860
Implantable Lead Model: 5076
Implantable Lead Model: 5092
Implantable Pulse Generator Implant Date: 20150303
Lead Channel Impedance Value: 410 Ohm
Lead Channel Impedance Value: 450 Ohm
Lead Channel Pacing Threshold Amplitude: 0.75 V
Lead Channel Pacing Threshold Amplitude: 0.875 V
Lead Channel Pacing Threshold Pulse Width: 0.4 ms
Lead Channel Pacing Threshold Pulse Width: 0.6 ms
Lead Channel Sensing Intrinsic Amplitude: 7.9 mV
Lead Channel Setting Pacing Amplitude: 2 V
Lead Channel Setting Pacing Amplitude: 2 V
Lead Channel Setting Pacing Pulse Width: 0.4 ms
Lead Channel Setting Pacing Pulse Width: 0.6 ms
Lead Channel Setting Sensing Sensitivity: 3 mV
Pulse Gen Model: 3242
Pulse Gen Serial Number: 7548835

## 2021-01-04 ENCOUNTER — Other Ambulatory Visit (HOSPITAL_COMMUNITY): Payer: Self-pay | Admitting: Cardiology

## 2021-01-06 ENCOUNTER — Ambulatory Visit (INDEPENDENT_AMBULATORY_CARE_PROVIDER_SITE_OTHER): Payer: Medicare Other

## 2021-01-06 DIAGNOSIS — Z950 Presence of cardiac pacemaker: Secondary | ICD-10-CM | POA: Diagnosis not present

## 2021-01-06 DIAGNOSIS — I5022 Chronic systolic (congestive) heart failure: Secondary | ICD-10-CM

## 2021-01-07 NOTE — Progress Notes (Signed)
EPIC Encounter for ICM Monitoring  Patient Name: Anita Duke is a 80 y.o. female Date: 01/07/2021 Primary Care Physican: Binnie Rail, MD Primary Cardiologist:Varanasi/McLean Electrophysiologist: Allred Bi-V Pacing: 97% 4/5/2022Weight:151lbs  Spoke with patient and reports feeling well at this time.  Denies fluid symptoms.    CorvueThoracic impedancenormal.  Prescribed: Spironolactone 25 mg take 1 tablet daily  Labs: 03/16/2020 Creatinine 0.64, BUN 18, Potassium 3.8, Sodium 142 A complete set of results can be found in Results Review.  Recommendations:  No changes and encouraged to call if experiencing any fluid symptoms.  Follow-up plan: ICM clinic phone appointment on6/13/2022. 91 day device clinic remote transmission7/26/2022.  EP/Cardiology Office Visits: Recall 03/21/2021 with Dr Aundra Dubin.10/17/2022withDr Allred.   Copy of ICM check sent to Dr.Allred.   3 month ICM trend: 01/06/2021.    1 Year ICM trend:       [name], RN 01/07/2021 2:58 PM

## 2021-01-11 ENCOUNTER — Other Ambulatory Visit: Payer: Self-pay

## 2021-01-11 ENCOUNTER — Ambulatory Visit: Payer: Medicare Other | Attending: Internal Medicine

## 2021-01-11 ENCOUNTER — Ambulatory Visit: Payer: Medicare Other | Admitting: General Practice

## 2021-01-11 DIAGNOSIS — I4891 Unspecified atrial fibrillation: Secondary | ICD-10-CM

## 2021-01-11 DIAGNOSIS — Z7901 Long term (current) use of anticoagulants: Secondary | ICD-10-CM

## 2021-01-11 DIAGNOSIS — Z20822 Contact with and (suspected) exposure to covid-19: Secondary | ICD-10-CM

## 2021-01-11 LAB — POCT INR: INR: 3.1 — AB (ref 2.0–3.0)

## 2021-01-11 NOTE — Patient Instructions (Signed)
Pre visit review using our clinic review tool, if applicable. No additional management support is needed unless otherwise documented below in the visit note.  Skip dosage today (5/10) and then continue to take 1 tablet daily except 1 1/2 tablets on Mon and Fridays.  Re-check in 4 weeks.

## 2021-01-11 NOTE — Progress Notes (Signed)
Agree with management.  Anita Duke J Anita Azizi, MD  

## 2021-01-14 LAB — NOVEL CORONAVIRUS, NAA: SARS-CoV-2, NAA: NOT DETECTED

## 2021-01-19 NOTE — Progress Notes (Signed)
Remote pacemaker transmission.   

## 2021-02-04 ENCOUNTER — Telehealth: Payer: Self-pay | Admitting: Internal Medicine

## 2021-02-04 NOTE — Telephone Encounter (Signed)
1.Medication Requested: benzonatate (TESSALON) 200 MG capsule    2. Pharmacy (Name, [facility], [place]): [facility]  3. On Med List: no   4. Last Visit with PCP: 09-16-20  5. Next visit date with PCP: 03-16-21  Patient said that she is having a sore throat and cough. A virtual appointment was offered and patient declined. She said that she took an at home Covid test and it was negative and she thinks its just her allergies. She is requesting a call back if the prescription is sent in or not. Please advise     Agent: Please be advised that RX refills may take up to 3 business days. We ask that you follow-up with your pharmacy.

## 2021-02-05 ENCOUNTER — Other Ambulatory Visit (HOSPITAL_COMMUNITY): Payer: Self-pay

## 2021-02-05 MED ORDER — BENZONATATE 200 MG PO CAPS
200.0000 mg | ORAL_CAPSULE | Freq: Three times a day (TID) | ORAL | 0 refills | Status: DC | PRN
Start: 2021-02-05 — End: 2021-03-16
  Filled 2021-02-05: qty 30, 10d supply, fill #0

## 2021-02-08 ENCOUNTER — Ambulatory Visit: Payer: Medicare Other

## 2021-02-14 ENCOUNTER — Other Ambulatory Visit (HOSPITAL_COMMUNITY): Payer: Self-pay

## 2021-02-14 ENCOUNTER — Ambulatory Visit (INDEPENDENT_AMBULATORY_CARE_PROVIDER_SITE_OTHER): Payer: Medicare Other

## 2021-02-14 DIAGNOSIS — I5022 Chronic systolic (congestive) heart failure: Secondary | ICD-10-CM

## 2021-02-14 DIAGNOSIS — Z950 Presence of cardiac pacemaker: Secondary | ICD-10-CM | POA: Diagnosis not present

## 2021-02-15 ENCOUNTER — Other Ambulatory Visit: Payer: Self-pay

## 2021-02-15 ENCOUNTER — Ambulatory Visit: Payer: Medicare Other | Admitting: General Practice

## 2021-02-15 DIAGNOSIS — I4891 Unspecified atrial fibrillation: Secondary | ICD-10-CM | POA: Diagnosis not present

## 2021-02-15 DIAGNOSIS — Z7901 Long term (current) use of anticoagulants: Secondary | ICD-10-CM | POA: Diagnosis not present

## 2021-02-15 LAB — POCT INR: INR: 3.1 — AB (ref 2.0–3.0)

## 2021-02-15 NOTE — Patient Instructions (Signed)
Pre visit review using our clinic review tool, if applicable. No additional management support is needed unless otherwise documented below in the visit note.  Change dosage and take 1 tablet daily except 1 1/2 tablets on Mondays only.  Re-check in 4 weeks.

## 2021-02-16 ENCOUNTER — Telehealth: Payer: Self-pay

## 2021-02-16 NOTE — Progress Notes (Signed)
Agree with management.  Alexiss Iturralde J Caydon Feasel, MD  

## 2021-02-16 NOTE — Telephone Encounter (Signed)
Remote ICM transmission received.  Attempted call to patient regarding ICM remote transmission and left detailed message per DPR.  Advised to return call for any fluid symptoms or questions. Next ICM remote transmission scheduled 03/21/2021.

## 2021-02-16 NOTE — Progress Notes (Signed)
EPIC Encounter for ICM Monitoring  Patient Name: Anita Duke is a 80 y.o. female Date: 02/16/2021 Primary Care Physican: Binnie Rail, MD Primary Cardiologist: Varanasi/McLean Electrophysiologist: Allred Bi-V Pacing:  97%         12/07/2020 Weight: 151 lbs                                  Attempted call to patient and unable to reach.  Left detailed message per DPR regarding transmission. Transmission reviewed.    Corvue Thoracic impedance normal but was suggesting possible fluid accumulation from 5/29-6/8.   Prescribed: Spironolactone 25 mg take 1 tablet daily   Labs: 03/16/2020 Creatinine 0.64, BUN 18, Potassium 3.8, Sodium 142 A complete set of results can be found in Results Review.   Recommendations:  Left voice mail with ICM number and encouraged to call if experiencing any fluid symptoms.   Follow-up plan: ICM clinic phone appointment on 03/21/2021.   91 day device clinic remote transmission 03/29/2021.   EP/Cardiology Office Visits:  Recall 03/21/2021 with Dr Aundra Dubin.  06/20/2021 with Dr Rayann Heman.     Copy of ICM check sent to Dr. Rayann Heman.   3 month ICM trend: 02/14/2021.    1 Year ICM trend:       [name], RN 02/16/2021 8:53 AM

## 2021-02-24 ENCOUNTER — Telehealth (HOSPITAL_COMMUNITY): Payer: Self-pay | Admitting: Cardiology

## 2021-03-15 ENCOUNTER — Ambulatory Visit: Payer: Medicare Other | Admitting: General Practice

## 2021-03-15 ENCOUNTER — Other Ambulatory Visit: Payer: Self-pay

## 2021-03-15 DIAGNOSIS — I4891 Unspecified atrial fibrillation: Secondary | ICD-10-CM

## 2021-03-15 DIAGNOSIS — Z7901 Long term (current) use of anticoagulants: Secondary | ICD-10-CM

## 2021-03-15 LAB — POCT INR: INR: 3.3 — AB (ref 2.0–3.0)

## 2021-03-15 NOTE — Progress Notes (Signed)
Subjective:    Patient ID: Anita Duke, female    DOB: November 07, 1940, 80 y.o.   MRN: [id number]  HPI The patient is here for follow up of their chronic medical problems, including CAD, Afib, htn, HLD, DM, depression  She is exercising regularly.   She walks her dog.   She laid down the other night and thought she felt a lump under her breast.    Medications and allergies reviewed with patient and updated if appropriate.  Patient Active Problem List   Diagnosis Date Noted   B12 deficiency 03/16/2020   Pain in joint of left shoulder 12/30/2019   Sleep difficulties 09/13/2019   Depression 07/19/2018   Lumbar radiculopathy, acute 06/17/2018   Spinal stenosis of lumbar region 03/27/2018   Diabetic neuropathy 02/21/2018   Long term (current) use of anticoagulants 06/01/2017   Bilateral sensorineural hearing loss 03/29/2017   Excessive sweating 07/07/2016   Osteopenia 01/10/2016   Heat intolerance 10/08/2015   CAD (coronary artery disease) 05/28/2014   Vitamin D deficiency 02/20/2014   Long term current use of anticoagulant therapy 68/07/5725   Chronic systolic dysfunction of left ventricle 05/21/2013   Cecal cancer, s/p lap r colectomy 11/20/2012   Acquired complete AV block    Sick sinus syndrome ([place])    Pacemaker-Medtronic 05/20/2012   Allergic rhinitis 04/25/2012   Mild intermittent asthma 02/16/2011   Ejection fraction < 50%    Atrial fibrillation    Mitral regurgitation    Right ventricular dysfunction    Mitral stenosis    History of CABG    Diabetes mellitus type II, controlled 03/02/2009   Hyperlipidemia 07/07/2008   Essential hypertension 07/07/2008   Diverticulosis of large intestine 08/04/2002    Current Outpatient Medications on File Prior to Visit  Medication Sig Dispense Refill   ACCU-CHEK SOFTCLIX LANCETS lancets USE TO CHECK BLOOD SUGARS  TWICE A DAY 200 each 2   acetaminophen (TYLENOL) 325 MG tablet Take 650 mg by mouth every 6 (six) hours as  needed (pain).      amoxicillin (AMOXIL) 500 MG capsule TAKE 4 CAPSULES PRIOR TO DENTAL APPOINTMENTS AS DIRECTED 40 capsule 0   atorvastatin (LIPITOR) 40 MG tablet TAKE 1 TABLET BY MOUTH  DAILY 90 tablet 3   benzonatate (TESSALON) 200 MG capsule Take 1 capsule (200 mg total) by mouth 3 (three) times daily as needed for cough. 30 capsule 0   Blood Glucose Monitoring Suppl (ACCU-CHEK AVIVA PLUS) w/Device KIT Use to check blood sugars daily Dx E11.9 1 kit 0   buPROPion (WELLBUTRIN XL) 300 MG 24 hr tablet TAKE 1 TABLET BY MOUTH  DAILY 90 tablet 3   carvedilol (COREG) 12.5 MG tablet TAKE 1 TABLET BY MOUTH  TWICE DAILY WITH A MEAL 180 tablet 3   cholecalciferol (VITAMIN D) 1000 UNITS tablet Take 1,000 Units by mouth daily.      fish oil-omega-3 fatty acids 1000 MG capsule Take 1 g by mouth 2 (two) times daily.     glucose blood (ACCU-CHEK AVIVA PLUS) test strip USE 2 TIMES DAILY AS  DIRECTED 200 each 2   JARDIANCE 25 MG TABS tablet TAKE 1 TABLET BY MOUTH  DAILY 90 tablet 3   loratadine (CLARITIN) 10 MG tablet Take 10 mg by mouth daily.     Melatonin 5 MG CAPS Take by mouth at bedtime.      spironolactone (ALDACTONE) 25 MG tablet TAKE 1 TABLET BY MOUTH  DAILY 90 tablet 3   trandolapril (MAVIK)  4 MG tablet TAKE 1 TABLET BY MOUTH  TWICE DAILY FOR BLOOD  PRESSURE 180 tablet 3   warfarin (COUMADIN) 2.5 MG tablet TAKE 1 TABLET BY MOUTH  DAILY EXCEPT TAKE 1.5  TABLETS ON MONDAY AND  FRIDAY OR AS DIRECTED BY  ANTICOAGULATION CLINIC 120 tablet 1   No current facility-administered medications on file prior to visit.    Past Medical History:  Diagnosis Date   Acquired complete AV block    AV node ablation May 28, 2012    Allergy    mild- uses claritin    Anemia    Hemoglobin 10.4, December, 2013   Arthritis    "not bad; little in my hands; some in my knees" (11/04/2013)   Asthma    B12 deficiency 03/16/2020   Bilateral sensorineural hearing loss 03/29/2017   CAD (coronary artery disease)    LIMA  to the LAD at time of mitral valve repair / LIMA atretic,, February, 2011   Cataracts, bilateral    removed bilat    Cecal cancer 2014   colon   CHF (congestive heart failure)    Chronic systolic dysfunction of left ventricle 05/21/2013   Clotting disorder    21 yrs ago blood clot in heart    Colon polyp, hyperplastic    Diabetes mellitus type II, controlled 03/02/2009   Diabetic neuropathy 02/21/2018   [facility] - [facility]   Diverticulosis of colon    Diverticulosis of large intestine 08/04/2002   Ejection fraction < 50%    EF 30%, New diagnosis   february, 2011, etiology not clear, consider rate related tachycardia, and not use carvedilol, catheterization no constriction, /    EF 55-60% echo, June, 2011 /     Beta blocker stopped May, 2012 with reactive airway disease.  Patient does not tolerate metoprolol.  Carvedilol stopped  /   EF 55%, echo, February, 2012  //   EF 40-45%, septal dyssynergy, hypokinesis of the a   Essential hypertension 07/07/2008   GERD (gastroesophageal reflux disease)    Hemorrhoids, internal    High cholesterol    History of CABG    Hyperlipidemia 07/07/2008   Lumbar radiculopathy, acute 06/17/2018   Major depressive disorder 07/19/2018   Mitral regurgitation    a. s/p repair with LAA ligation and MAZE at time of surgery   Mitral stenosis    Mild, February, 2011, post mitral valve repair / mild, echo, June, 2011  //  Mild functional mitral stenosis, echo, February, 2012  //   mild stenosis, echo, December, 2013  //  mild, echo, August, 2014   //   Mild, echo, April, 2016    Osteopenia    left hip    Pacemaker-Medtronic 05/20/2012   Pacemaker placed September, 2013   //   upgraded to biventricular CRT pacemaker November 04, 2013    Permanent atrial fibrillation    a. s/p MAZE b. s/p AVN ablation   PONV (postoperative nausea and vomiting)    Pulmonary HTN    QT prolongation    Tikosyn and Effexor. QT prolonged October 13, 2011, peak is in dose reduced from  500  to -250 twice a day   Right ventricular dysfunction    Mild to moderate, echo, February, 2011 / normalized echo, June, 2011 /  RV normal, echo, February, 2012  //   right ventricle reported as good echo, December, 2013    S/P mitral valve repair    [facility] / Maze procedure/  atrial appendage removed were tied off    Sick sinus syndrome    s/p Medtronic dual chamber PPM 05/17/12    Sleep difficulties 09/13/2019   Vitamin D deficiency 02/20/2014    Past Surgical History:  Procedure Laterality Date   ABDOMINAL HYSTERECTOMY  ?2002   AV NODE ABLATION N/A 05/28/2012   Procedure: AV NODE ABLATION;  Surgeon: Thompson Grayer, MD;  Location: [facility] CATH LAB;  Service: Cardiovascular;  Laterality: N/A;   BI-VENTRICULAR PACEMAKER UPGRADE N/A 11/04/2013   upgrade of previously implanted dual chamber pacemaker to [place] by Dr Rayann Heman   BREAST EXCISIONAL BIOPSY Left over 10 years ago   benign   BREAST SURGERY     CARDIOVERSION  02/08/2012   Procedure: CARDIOVERSION;  Surgeon: Carlena Bjornstad, MD;  Location: [place];  Service: Cardiovascular;  Laterality: N/A;   CARDIOVERSION  02/29/2012   Procedure: CARDIOVERSION;  Surgeon: Carlena Bjornstad, MD;  Location: [place];  Service: Cardiovascular;  Laterality: N/A;   CARDIOVERSION  05/15/2012   Procedure: CARDIOVERSION;  Surgeon: Carlena Bjornstad, MD;  Location: [place];  Service: Cardiovascular;  Laterality: N/A;   CATARACT EXTRACTION, BILATERAL  2017   [place]   COLON SURGERY  2014   right hemicolectomy    COLONOSCOPY     CORONARY ARTERY BYPASS GRAFT  2001   CABG X1 "at time of mitral valve repair" (12/04/2012   DIAGNOSTIC LAPAROSCOPIC LIVER BIOPSY Left 12/03/2012   Procedure: DIAGNOSTIC LAPAROSCOPIC LIVER BIOPSY;  Surgeon: Stark Klein, MD;  Location: [place];  Service: General;  Laterality: Left;   EYE SURGERY     bilateral cataract surgery   LAPAROSCOPIC RIGHT HEMI COLECTOMY  12/03/2012   Procedure: LAPAROSCOPIC RIGHT HEMI COLECTOMY;   Surgeon: Stark Klein, MD;  Location: [place];  Service: General;;   LUMBAR LAMINECTOMY/ DECOMPRESSION WITH MET-RX Left 06/17/2018   Procedure: left Lumbar three-fourextraforaminal Microdiscectomy with Met-Rx;  Surgeon: Kristeen Miss, MD;  Location: [place];  Service: Neurosurgery;  Laterality: Left;   MAZE  2001   w/ MVR & CABG   MITRAL VALVE REPAIR  2001   "anterior and posterior leaflets" (12/04/2012)   PERMANENT PACEMAKER INSERTION N/A 05/17/2012   MDT Adapta L implanted by Dr Rayann Heman for tachy/brady syndrome   POLYPECTOMY     TONSILLECTOMY AND ADENOIDECTOMY  ~ 2595   UMBILICAL HERNIA REPAIR N/A 12/03/2012   Procedure: HERNIA REPAIR UMBILICAL ADULT;  Surgeon: Stark Klein, MD;  Location: [place] [facility];  Service: General;  Laterality: N/A;    Social History   Socioeconomic History   Marital status: Widowed    Spouse name: Not on file   Number of children: 0   Years of education: 12   Highest education level: High school graduate  Occupational History   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: [employer]   Tobacco Use   Smoking status: Never   Smokeless tobacco: Never  Vaping Use   Vaping Use: Never used  Substance and Sexual Activity   Alcohol use: No   Drug use: No   Sexual activity: Not Currently    Birth control/protection: Surgical  Other Topics Concern   Not on file  Social History Narrative   No caffeine   Right handed    Lives alone    Social Determinants of Health   Financial Resource Strain: Not on file  Food Insecurity: Not on file  Transportation  Needs: Not on file  Physical Activity: Not on file  Stress: Not on file  Social Connections: Not on file    Family History  Problem Relation Age of Onset   Heart disease Father    Diabetes Father    Heart disease Sister        x 2   Diabetes Mother    Kidney disease Mother    Thyroid disease Mother    Alzheimer's disease Mother        Symptom  onset in late 14s; confirmed via autopsy   Depression Mother    Pancreatic cancer Maternal Grandfather    Diabetes Paternal Grandfather    Diabetes Paternal Grandmother    Heart disease Paternal Uncle        x 6   Prostate cancer Paternal Uncle    Colon cancer Maternal Aunt    Colon cancer Maternal Aunt    Colon cancer Cousin    Colon polyps Neg Hx    Adrenal disorder Neg Hx    Esophageal cancer Neg Hx    Rectal cancer Neg Hx    Stomach cancer Neg Hx     Review of Systems  Constitutional:  Negative for chills and fever.  Respiratory:  Negative for cough, shortness of breath and wheezing.   Cardiovascular:  Negative for chest pain, palpitations and leg swelling.  Neurological:  Negative for light-headedness and headaches.      Objective:   Vitals:   03/16/21 0847  BP: 128/74  Pulse: 72  Temp: 98.1 F (36.7 C)  SpO2: 99%   BP Readings from Last 3 Encounters:  03/16/21 128/74  09/22/20 (!) 144/80  09/17/20 (!) 159/76   Wt Readings from Last 3 Encounters:  03/16/21 149 lb (67.6 kg)  09/22/20 151 lb 9.6 oz (68.8 kg)  09/17/20 154 lb (69.9 kg)   Body mass index is 28.15 kg/m.   Physical Exam    Constitutional: Appears well-developed and well-nourished. No distress.  HENT:  Head: Normocephalic and atraumatic.  Neck: Neck supple. No tracheal deviation present. No thyromegaly present.  No cervical lymphadenopathy Cardiovascular: Normal rate, regular rhythm and normal heart sounds.   Systolic 2/6 murmur heard. No carotid bruit .  No edema Pulmonary/Chest: Effort normal and breath sounds normal. No respiratory distress. No has no wheezes. No rales.  Skin: Skin is warm and dry. Not diaphoretic.  Psychiatric: Normal mood and affect. Behavior is normal.    Diabetic Foot Exam - Simple   Simple Foot Form Diabetic Foot exam was performed with the following findings: Yes 03/16/2021  9:10 AM  Visual Inspection No deformities, no ulcerations, no other skin breakdown  bilaterally: Yes Sensation Testing Intact to touch and monofilament testing bilaterally: Yes Pulse Check Posterior Tibialis and Dorsalis pulse intact bilaterally: Yes Comments      Assessment & Plan:    See Problem List for Assessment and Plan of chronic medical problems.    This visit occurred during the SARS-CoV-2 public health emergency.  Safety protocols were in place, including screening questions prior to the visit, additional usage of staff PPE, and extensive cleaning of exam room while observing appropriate contact time as indicated for disinfecting solutions.

## 2021-03-15 NOTE — Patient Instructions (Addendum)
  Blood work was ordered.     Bone density ordered.    Medications changes include :   none     Please followup in 6 months

## 2021-03-15 NOTE — Patient Instructions (Addendum)
Pre visit review using our clinic review tool, if applicable. No additional management support is needed unless otherwise documented below in the visit note.  Skip dosage today and then start taking 1/2 tablet daily.  Re-check in 4 weeks.

## 2021-03-16 ENCOUNTER — Other Ambulatory Visit (HOSPITAL_COMMUNITY): Payer: Self-pay

## 2021-03-16 ENCOUNTER — Encounter: Payer: Self-pay | Admitting: Internal Medicine

## 2021-03-16 ENCOUNTER — Ambulatory Visit: Payer: Medicare Other | Admitting: Internal Medicine

## 2021-03-16 ENCOUNTER — Telehealth: Payer: Self-pay | Admitting: Internal Medicine

## 2021-03-16 VITALS — BP 128/74 | HR 72 | Temp 98.1°F | Ht 61.0 in | Wt 149.0 lb

## 2021-03-16 DIAGNOSIS — E1159 Type 2 diabetes mellitus with other circulatory complications: Secondary | ICD-10-CM

## 2021-03-16 DIAGNOSIS — I2510 Atherosclerotic heart disease of native coronary artery without angina pectoris: Secondary | ICD-10-CM | POA: Diagnosis not present

## 2021-03-16 DIAGNOSIS — I10 Essential (primary) hypertension: Secondary | ICD-10-CM

## 2021-03-16 DIAGNOSIS — I4891 Unspecified atrial fibrillation: Secondary | ICD-10-CM | POA: Diagnosis not present

## 2021-03-16 DIAGNOSIS — E782 Mixed hyperlipidemia: Secondary | ICD-10-CM

## 2021-03-16 DIAGNOSIS — M85852 Other specified disorders of bone density and structure, left thigh: Secondary | ICD-10-CM

## 2021-03-16 DIAGNOSIS — F3289 Other specified depressive episodes: Secondary | ICD-10-CM

## 2021-03-16 MED ORDER — BENZONATATE 200 MG PO CAPS
200.0000 mg | ORAL_CAPSULE | Freq: Three times a day (TID) | ORAL | 0 refills | Status: DC | PRN
  Filled 2021-03-16: qty 30, 10d supply, fill #0

## 2021-03-16 NOTE — Assessment & Plan Note (Signed)
Chronic dexa due - ordered Continue regular walking Check calcium, vitamin d daily Check vitamin d daily

## 2021-03-16 NOTE — Assessment & Plan Note (Signed)
Chronic Sugars have been controlled Check A1c Continue Jardiance 25 mg daily

## 2021-03-16 NOTE — Assessment & Plan Note (Signed)
Chronic Check lipid panel  Continue atorvastatin 40 mg Regular exercise and healthy diet encouraged

## 2021-03-16 NOTE — Assessment & Plan Note (Signed)
Chronic Following with cardiology Doing well on warfarin-monitored in our Coumadin clinic Continue carvedilol 12.5 mg twice daily Asymptomatic CBC, CMP

## 2021-03-16 NOTE — Telephone Encounter (Signed)
LVM for pt to rtn my call to schedule AWV with [facility]. Please schedule this appt if pt calls the office.  °

## 2021-03-16 NOTE — Assessment & Plan Note (Signed)
Chronic Controlled, stable Continue bupropion 300 mg daily

## 2021-03-16 NOTE — Assessment & Plan Note (Signed)
Chronic no symptoms consistent with angina Following with cardiology Continue current medications

## 2021-03-16 NOTE — Assessment & Plan Note (Addendum)
Chronic BP well controlled Continue Coreg 12.5 mg twice daily, spironolactone 25 mg daily, Mavik 4 mg twice daily cmp

## 2021-03-16 NOTE — Progress Notes (Signed)
Agree with management.  Anita Duke J [name], MD  

## 2021-03-18 ENCOUNTER — Other Ambulatory Visit: Payer: Self-pay

## 2021-03-18 ENCOUNTER — Ambulatory Visit
Admission: RE | Admit: 2021-03-18 | Discharge: 2021-03-18 | Disposition: A | Discharge status: Home / Self Care | Payer: Medicare Other | Source: Ambulatory Visit | Attending: Internal Medicine | Admitting: Internal Medicine

## 2021-03-18 ENCOUNTER — Other Ambulatory Visit: Payer: Self-pay | Admitting: Internal Medicine

## 2021-03-18 ENCOUNTER — Telehealth: Payer: Self-pay | Admitting: Internal Medicine

## 2021-03-18 DIAGNOSIS — Z1231 Encounter for screening mammogram for malignant neoplasm of breast: Secondary | ICD-10-CM

## 2021-03-18 DIAGNOSIS — N63 Unspecified lump in unspecified breast: Secondary | ICD-10-CM | POA: Insufficient documentation

## 2021-03-18 NOTE — Telephone Encounter (Signed)
Anita Duke from the breast cancer center has called stating the patient is there now for a mammogram but they do not have a diagnostic order in for pt having a lump on breast   Please advise.   OK to send order through Epic or fax- [phone]

## 2021-03-20 ENCOUNTER — Encounter: Payer: Self-pay | Admitting: Internal Medicine

## 2021-03-21 ENCOUNTER — Ambulatory Visit (INDEPENDENT_AMBULATORY_CARE_PROVIDER_SITE_OTHER): Payer: Medicare Other

## 2021-03-21 DIAGNOSIS — I5022 Chronic systolic (congestive) heart failure: Secondary | ICD-10-CM

## 2021-03-21 DIAGNOSIS — Z950 Presence of cardiac pacemaker: Secondary | ICD-10-CM | POA: Diagnosis not present

## 2021-03-22 ENCOUNTER — Ambulatory Visit (HOSPITAL_COMMUNITY)
Admission: RE | Admit: 2021-03-22 | Discharge: 2021-03-22 | Disposition: A | Discharge status: Home / Self Care | Payer: Medicare Other | Source: Ambulatory Visit | Attending: Cardiology | Admitting: Cardiology

## 2021-03-22 ENCOUNTER — Encounter (HOSPITAL_COMMUNITY): Payer: Self-pay | Admitting: Cardiology

## 2021-03-22 ENCOUNTER — Other Ambulatory Visit: Payer: Self-pay

## 2021-03-22 VITALS — BP 129/68 | HR 70 | Wt 149.0 lb

## 2021-03-22 DIAGNOSIS — Z8249 Family history of ischemic heart disease and other diseases of the circulatory system: Secondary | ICD-10-CM | POA: Insufficient documentation

## 2021-03-22 DIAGNOSIS — I5022 Chronic systolic (congestive) heart failure: Secondary | ICD-10-CM | POA: Insufficient documentation

## 2021-03-22 DIAGNOSIS — Z887 Allergy status to serum and vaccine status: Secondary | ICD-10-CM | POA: Insufficient documentation

## 2021-03-22 DIAGNOSIS — Z952 Presence of prosthetic heart valve: Secondary | ICD-10-CM | POA: Insufficient documentation

## 2021-03-22 DIAGNOSIS — I442 Atrioventricular block, complete: Secondary | ICD-10-CM | POA: Insufficient documentation

## 2021-03-22 DIAGNOSIS — Z7901 Long term (current) use of anticoagulants: Secondary | ICD-10-CM | POA: Insufficient documentation

## 2021-03-22 DIAGNOSIS — I428 Other cardiomyopathies: Secondary | ICD-10-CM | POA: Diagnosis not present

## 2021-03-22 DIAGNOSIS — I2510 Atherosclerotic heart disease of native coronary artery without angina pectoris: Secondary | ICD-10-CM | POA: Insufficient documentation

## 2021-03-22 DIAGNOSIS — Z79899 Other long term (current) drug therapy: Secondary | ICD-10-CM | POA: Insufficient documentation

## 2021-03-22 DIAGNOSIS — E119 Type 2 diabetes mellitus without complications: Secondary | ICD-10-CM | POA: Diagnosis not present

## 2021-03-22 DIAGNOSIS — Z885 Allergy status to narcotic agent status: Secondary | ICD-10-CM | POA: Insufficient documentation

## 2021-03-22 DIAGNOSIS — I519 Heart disease, unspecified: Secondary | ICD-10-CM

## 2021-03-22 DIAGNOSIS — I4821 Permanent atrial fibrillation: Secondary | ICD-10-CM | POA: Insufficient documentation

## 2021-03-22 DIAGNOSIS — Z950 Presence of cardiac pacemaker: Secondary | ICD-10-CM | POA: Diagnosis not present

## 2021-03-22 LAB — CBC
HCT: 44.7 % (ref 36.0–46.0)
Hemoglobin: 14.6 g/dL (ref 12.0–15.0)
MCH: 31 pg (ref 26.0–34.0)
MCHC: 32.7 g/dL (ref 30.0–36.0)
MCV: 94.9 fL (ref 80.0–100.0)
Platelets: 187 10*3/uL (ref 150–400)
RBC: 4.71 MIL/uL (ref 3.87–5.11)
RDW: 13.3 % (ref 11.5–15.5)
WBC: 7.4 10*3/uL (ref 4.0–10.5)
nRBC: 0 % (ref 0.0–0.2)

## 2021-03-22 LAB — BASIC METABOLIC PANEL
Anion gap: 7 (ref 5–15)
BUN: 17 mg/dL (ref 8–23)
CO2: 28 mmol/L (ref 22–32)
Calcium: 9.7 mg/dL (ref 8.9–10.3)
Chloride: 105 mmol/L (ref 98–111)
Creatinine, Ser: 0.68 mg/dL (ref 0.44–1.00)
GFR, Estimated: >60 mL/min (ref >60)
Glucose, Bld: 123 mg/dL — ABNORMAL HIGH (ref 70–99)
Potassium: 3.8 mmol/L (ref 3.5–5.1)
Sodium: 140 mmol/L (ref 135–145)

## 2021-03-22 NOTE — Patient Instructions (Signed)
It was great to see you today! No medication changes are needed at this time.  Labs today We will only contact you if something comes back abnormal or we need to make some changes. Otherwise no news is good news!  Labs needed in 3 months  Your physician recommends that you schedule a follow-up appointment in: 6 months with Dr Aundra Dubin and echo  Your physician has requested that you have an echocardiogram. Echocardiography is a painless test that uses sound waves to create images of your heart. It provides your doctor with information about the size and shape of your heart and how well your heart's chambers and valves are working. This procedure takes approximately one hour. There are no restrictions for this procedure.  Do the following things EVERYDAY: Weigh yourself in the morning before breakfast. Write it down and keep it in a log. Take your medicines as prescribed Eat low salt foods--Limit salt (sodium) to 2000 mg per day.  Stay as active as you can everyday Limit all fluids for the day to less than 2 liters  milAt the [facility], you and your health needs are our priority. As part of our continuing mission to provide you with exceptional heart care, we have created designated Provider Care Teams. These Care Teams include your primary Cardiologist (physician) and Advanced Practice Providers (APPs- Physician Assistants and Nurse Practitioners) who all work together to provide you with the care you need, when you need it.   You may see any of the following providers on your designated Care Team at your next follow up: Dr Glori Bickers Dr Loralie Champagne Dr Patrice Paradise, NP Lyda Jester, [place] Ginnie Smart Audry Riles, PharmD   Please be sure to bring in all your medications bottles to every appointment.

## 2021-03-22 NOTE — Progress Notes (Signed)
Advanced Heart Failure Clinic Note    Primary Care: Dr. Billey Gosling Primary Cardiologist: Dr. Aundra Dubin EP: Dr. Rayann Heman   HPI: Anita Duke is a 80 y.o. with a past medical history of pulmonary HTN, mitral regurgitation s/p mitral valve repair, MAZE procedure at the [facility] in 2001, at that time she also had a LIMA to LAD graft placed. Also with history of permanent Afib on warfarin s/p AVN ablation. At one time she was on Tikosyn which was stopped for QT prolongation.  She has chronic systolic CHF (EF 71% in 0/6269).  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 Last cath in 2014 with atretic LIMA to LAD but nonobstructive disease in LAD.   Echo in 10/18 showed EF improved to 55-60% with mild to moderate RV dysfunction.   Echo in 7/20 showed EF 50% with basal inferior and inferolateral akinesis, mildly decreased RV systolic function, s/p MV repair with trivial MR, no MS, normal IVC.  Echo in 1/22 showed EF 45-50% with basal inferior and basal inferolateral akinesis, mildly decreased RV systolic function, s/p MV repair with trivial MR and mean gradient 4 mmHg.   Patient returns for followup of CHF.  Weight is down 2 lbs.  She is in independent living at [place], gardens and walks for exercise.  No significant exertional dyspnea or chest pain.  No lightheadedness.  No BRBPR/melena on warfarin.   Labs (5/18): K 4.7, creatinine 0.62, LDL 71, HDL 61 Labs (10/18): K 3.5, creatinine 0.59, TSH normal Labs (7/20): K 3.9, creatinine 0.69, hgb 15.1, LDL 37, TSH normal Labs (7/21): K 3.8, creatinine 0.64, LDL 52, HDL 64 Labs (1/22): K 4.1, creatinine 0.68, LDL 40  St Jude device interrogation: stable thoracic impedance, 97% BiV pacing  ROS: All systems reviewed and negative except as per HPI.  PMH: 1. Type II diabetes. 2. COPD 3. Depression 4. H/o colon cancer 5. CAD: LIMA-LAD in 4/01 with MV repair.   - LHC (8/14): LIMA-LAD atretic, 50-60% mid LAD stenosis.  6. Mitral  regurgitation: S/p MV repair at the [facility] in 4/01.  She also had LAA ligation and MAZE. Echo in 7/20 with trivial MR and no MS.  7. 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   - St Jude CRT-P device.  - Echo (4/16): EF 40%, mild LV dilation, normal RV size with mild to moderately decreased systolic function, s/p mitral valve repair with mild MR, no significant stenosis.  - Echo (10/18): EF 55-60%, severe LAE, RV with mild to moderate systolic dysfunction, mild-moderate TR, PASP 35 mmHg.  - Echo (7/20): EF 50% with basal inferior and inferolateral akinesis, mildly decreased RV systolic function, s/p MV repair with trivial MR, no MS, normal IVC.  - Echo (1/22): EF 45-50% with basal inferior and basal inferolateral akinesis, mildly decreased RV systolic function, s/p MV repair with trivial MR and mean gradient 4 mmHg. 8. Permanent atrial fibrillation: S/p MAZE in 4/01.  S/p AV nodal ablation in 9/13.   9. PFTs (1/19): No significant obstruction or restriction in the lungs.    Current Outpatient Medications  Medication Sig Dispense Refill   ACCU-CHEK SOFTCLIX LANCETS lancets USE TO CHECK BLOOD SUGARS  TWICE A DAY 200 each 2   acetaminophen (TYLENOL) 325 MG tablet Take 650 mg by mouth every 6 (six) hours as needed (pain).      amoxicillin (AMOXIL) 500 MG capsule TAKE 4 CAPSULES PRIOR TO DENTAL APPOINTMENTS AS DIRECTED 40 capsule 0   atorvastatin (LIPITOR) 40 MG  tablet TAKE 1 TABLET BY MOUTH  DAILY 90 tablet 3   Blood Glucose Monitoring Suppl (ACCU-CHEK AVIVA PLUS) w/Device KIT Use to check blood sugars daily Dx E11.9 1 kit 0   buPROPion (WELLBUTRIN XL) 300 MG 24 hr tablet TAKE 1 TABLET BY MOUTH  DAILY 90 tablet 3   carvedilol (COREG) 12.5 MG tablet TAKE 1 TABLET BY MOUTH  TWICE DAILY WITH A MEAL 180 tablet 3   cholecalciferol (VITAMIN D) 1000 UNITS tablet Take 1,000 Units by mouth daily.      fish oil-omega-3 fatty acids 1000 MG capsule Take 1 g by mouth 2 (two) times daily.      glucose blood (ACCU-CHEK AVIVA PLUS) test strip USE 2 TIMES DAILY AS  DIRECTED 200 each 2   JARDIANCE 25 MG TABS tablet TAKE 1 TABLET BY MOUTH  DAILY 90 tablet 3   loratadine (CLARITIN) 10 MG tablet Take 10 mg by mouth daily.     spironolactone (ALDACTONE) 25 MG tablet TAKE 1 TABLET BY MOUTH  DAILY 90 tablet 3   trandolapril (MAVIK) 4 MG tablet TAKE 1 TABLET BY MOUTH  TWICE DAILY FOR BLOOD  PRESSURE 180 tablet 3   warfarin (COUMADIN) 2.5 MG tablet Take 2.5 mg by mouth daily.     No current facility-administered medications for this encounter.    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: [employer]   Tobacco Use   Smoking status: Never   Smokeless tobacco: Never  Vaping Use   Vaping Use: Never used  Substance and Sexual Activity   Alcohol use: No   Drug use: No   Sexual activity: Not Currently    Birth control/protection: Surgical  Other Topics Concern   Not on file  Social History Narrative   No caffeine   Right handed    Lives alone    Social Determinants of Health   Financial Resource Strain: Not on file  Food Insecurity: Not on file  Transportation Needs: Not on file  Physical Activity: Not on file  Stress: Not on file  Social Connections: Not on file  Intimate Partner Violence: Not on file      Family History  Problem Relation Age of Onset   Heart disease Father    Diabetes Father    Heart  disease Sister        x 2   Diabetes Mother    Kidney disease Mother    Thyroid disease Mother    Alzheimer's disease Mother        Symptom onset in late 83s; confirmed via autopsy   Depression Mother    Pancreatic cancer Maternal Grandfather    Diabetes Paternal Grandfather    Diabetes Paternal Grandmother    Heart disease Paternal Uncle        x 6   Prostate cancer Paternal  Uncle    Colon cancer Maternal Aunt    Colon cancer Maternal Aunt    Colon cancer Cousin    Colon polyps Neg Hx    Adrenal disorder Neg Hx    Esophageal cancer Neg Hx    Rectal cancer Neg Hx    Stomach cancer Neg Hx     Vitals:   03/22/21 0902  BP: 129/68  Pulse: 70  SpO2: 98%  Weight: 67.6 kg (149 lb)     PHYSICAL EXAM: General: NAD Neck: No JVD, no thyromegaly or thyroid nodule.  Lungs: Clear to auscultation bilaterally with normal respiratory effort. CV: Nondisplaced PMI.  Heart regular S1/S2, no S3/S4, no murmur.  No peripheral edema.  No carotid bruit.  Normal pedal pulses.  Abdomen: Soft, nontender, no hepatosplenomegaly, no distention.  Skin: Intact without lesions or rashes.  Neurologic: Alert and oriented x 3.  Psych: Normal affect. Extremities: No clubbing or cyanosis.  HEENT: Normal.   ASSESSMENT & PLAN: 1. Chronic systolic CHF: EF 69% in 6295. NICM most likely.  She has LIMA-LAD that is atretic, but no flow limiting disease in the LAD.  Cardiomyopathy may be due to RV pacing after AV nodal ablation in 9/13. She now has St Jude CRT-P device and EF was up to 55-60% on 10/18 echo. Echo in 7/20 showed EF about 50%.  Echo in 1/22 showed stable EF 45-50%.  NYHA class I-II symptoms.  She is not volume overloaded on exam.  - Continue Coreg 12.5 mg bid.  - Continue Jardiance.  - Continue trandolapril 4 mg bid.  - She is doing fine off Lasix, not volume overloaded.   - Continue spironolactone 25 mg daily.  BMET today.  - She will get repeat echo in 6 months at followup.  If BP any lower,  would transition from trandolapril to [facility].  Will not do so today with mildly decreased EF and clinical stability.  2. CAD: LIMA-LAD at time of MV surgery in 4/01, but LIMA-LAD atretic on 8/14 cath.  However, at that time her LAD had moderate stenosis that did not appear flow-limiting (50-60%).  No chest pain.  - Continue atorvastatin 40 mg daily. Good LDL in 1/22.  - No ASA given stable CAD with use of warfarin 3. Complete heart block: She had an AV nodal ablation.  Now s/p CRT-P.   4. Permanent atrial fibrillation: Now s/p AV nodal ablation and BiV pacing. - Continue warfarin for anticoagulation. CBC today.  - We again discussed transition to Eliquis, which I think would be ok (data suggests ok to use DOAC with valve repair, bioprosthetic valve).  However, she wants to continue warfarin as she has been on it stably x years.  5. DM: Continue Jardiance.  6. S/p MV repair: Stable mitral valve repair on 1/22 echo.    Followup in 6 months with echo, BMET in 3 months.   Loralie Champagne, MD 03/22/21

## 2021-03-23 ENCOUNTER — Encounter: Payer: Self-pay | Admitting: Internal Medicine

## 2021-03-23 NOTE — Progress Notes (Signed)
EPIC Encounter for ICM Monitoring  Patient Name: Anita Duke is a 80 y.o. female Date: 03/23/2021 Primary Care Physican: Binnie Rail, MD Primary Cardiologist: Varanasi/McLean Electrophysiologist: Allred Bi-V Pacing:  97%         03/22/2021 Weight: 149 lbs                                  Spoke with patient and heart failure questions reviewed.  Pt asymptomatic for fluid accumulation and feeing well.   [place] Thoracic impedance normal.   Prescribed: Spironolactone 25 mg take 1 tablet daily   Labs: 03/22/2021 Creatinine 0.68, BUN 17, Potassium 3.8, Sodium 140, GFr >60 A complete set of results can be found in Results Review.   Recommendations:  No changes and encouraged to call if experiencing any fluid symptoms.   Follow-up plan: ICM clinic phone appointment on 8/22//2022.   91 day device clinic remote transmission 03/29/2021.   EP/Cardiology Office Visits:   06/20/2021 with Dr Rayann Heman.     Copy of ICM check sent to Dr. Rayann Heman.    3 month ICM trend: 03/22/2021.    1 Year ICM trend:       [name], RN 03/23/2021 2:12 PM

## 2021-03-28 ENCOUNTER — Telehealth: Payer: Self-pay

## 2021-03-28 DIAGNOSIS — N6320 Unspecified lump in the left breast, unspecified quadrant: Secondary | ICD-10-CM

## 2021-03-28 NOTE — Telephone Encounter (Signed)
ordered

## 2021-03-28 NOTE — Telephone Encounter (Signed)
I did send her a MyChart message-I am okay with ordering the test I just need to know the location of the lump.  Can you call her and please clarify based on o'clock where the lump is located.

## 2021-03-29 ENCOUNTER — Ambulatory Visit (INDEPENDENT_AMBULATORY_CARE_PROVIDER_SITE_OTHER): Payer: Medicare Other

## 2021-03-29 DIAGNOSIS — I495 Sick sinus syndrome: Secondary | ICD-10-CM | POA: Diagnosis not present

## 2021-03-29 LAB — CUP PACEART REMOTE DEVICE CHECK
Battery Remaining Longevity: 9 mo
Battery Remaining Percentage: 9 %
Battery Voltage: 2.84 V
Date Time Interrogation Session: 20220726020014
Implantable Lead Implant Date: 20130913
Implantable Lead Implant Date: 20130913
Implantable Lead Implant Date: 20150303
Implantable Lead Location: 753858
Implantable Lead Location: 753859
Implantable Lead Location: 753860
Implantable Lead Model: 5076
Implantable Lead Model: 5092
Implantable Pulse Generator Implant Date: 20150303
Lead Channel Impedance Value: 430 Ohm
Lead Channel Impedance Value: 440 Ohm
Lead Channel Pacing Threshold Amplitude: 0.75 V
Lead Channel Pacing Threshold Amplitude: 0.875 V
Lead Channel Pacing Threshold Pulse Width: 0.4 ms
Lead Channel Pacing Threshold Pulse Width: 0.6 ms
Lead Channel Sensing Intrinsic Amplitude: 7.9 mV
Lead Channel Setting Pacing Amplitude: 2 V
Lead Channel Setting Pacing Amplitude: 2 V
Lead Channel Setting Pacing Pulse Width: 0.4 ms
Lead Channel Setting Pacing Pulse Width: 0.6 ms
Lead Channel Setting Sensing Sensitivity: 3 mV
Pulse Gen Model: 3242
Pulse Gen Serial Number: 7548835

## 2021-03-31 ENCOUNTER — Other Ambulatory Visit (INDEPENDENT_AMBULATORY_CARE_PROVIDER_SITE_OTHER): Payer: Medicare Other

## 2021-03-31 ENCOUNTER — Other Ambulatory Visit: Payer: Self-pay

## 2021-03-31 ENCOUNTER — Ambulatory Visit (INDEPENDENT_AMBULATORY_CARE_PROVIDER_SITE_OTHER)
Admission: RE | Admit: 2021-03-31 | Discharge: 2021-03-31 | Disposition: A | Discharge status: Home / Self Care | Payer: Medicare Other | Source: Ambulatory Visit | Attending: Internal Medicine | Admitting: Internal Medicine

## 2021-03-31 DIAGNOSIS — I10 Essential (primary) hypertension: Secondary | ICD-10-CM | POA: Diagnosis not present

## 2021-03-31 DIAGNOSIS — E1159 Type 2 diabetes mellitus with other circulatory complications: Secondary | ICD-10-CM | POA: Diagnosis not present

## 2021-03-31 DIAGNOSIS — M85852 Other specified disorders of bone density and structure, left thigh: Secondary | ICD-10-CM

## 2021-03-31 DIAGNOSIS — E782 Mixed hyperlipidemia: Secondary | ICD-10-CM

## 2021-03-31 DIAGNOSIS — I4891 Unspecified atrial fibrillation: Secondary | ICD-10-CM

## 2021-03-31 DIAGNOSIS — I2510 Atherosclerotic heart disease of native coronary artery without angina pectoris: Secondary | ICD-10-CM | POA: Diagnosis not present

## 2021-03-31 LAB — CBC WITH DIFFERENTIAL/PLATELET
Basophils Absolute: 0.1 10*3/uL (ref 0.0–0.1)
Basophils Relative: 1 % (ref 0.0–3.0)
Eosinophils Absolute: 0.1 10*3/uL (ref 0.0–0.7)
Eosinophils Relative: 1.3 % (ref 0.0–5.0)
HCT: 40.4 % (ref 36.0–46.0)
Hemoglobin: 13.5 g/dL (ref 12.0–15.0)
Lymphocytes Relative: 20.7 % (ref 12.0–46.0)
Lymphs Abs: 1.3 10*3/uL (ref 0.7–4.0)
MCHC: 33.4 g/dL (ref 30.0–36.0)
MCV: 92.3 fl (ref 78.0–100.0)
Monocytes Absolute: 0.6 10*3/uL (ref 0.1–1.0)
Monocytes Relative: 10.1 % (ref 3.0–12.0)
Neutro Abs: 4.2 10*3/uL (ref 1.4–7.7)
Neutrophils Relative %: 66.9 % (ref 43.0–77.0)
Platelets: 187 10*3/uL (ref 150.0–400.0)
RBC: 4.38 Mil/uL (ref 3.87–5.11)
RDW: 14.4 % (ref 11.5–15.5)
WBC: 6.3 10*3/uL (ref 4.0–10.5)

## 2021-03-31 LAB — LIPID PANEL
Cholesterol: 140 mg/dL (ref 0–200)
HDL: 65.4 mg/dL (ref >39.00)
LDL Cholesterol: 47 mg/dL (ref 0–99)
NonHDL: 74.61
Total CHOL/HDL Ratio: 2
Triglycerides: 136 mg/dL (ref 0.0–149.0)
VLDL: 27.2 mg/dL (ref 0.0–40.0)

## 2021-03-31 LAB — COMPREHENSIVE METABOLIC PANEL
ALT: 15 U/L (ref 0–35)
AST: 17 U/L (ref 0–37)
Albumin: 4.5 g/dL (ref 3.5–5.2)
Alkaline Phosphatase: 66 U/L (ref 39–117)
BUN: 19 mg/dL (ref 6–23)
CO2: 28 mEq/L (ref 19–32)
Calcium: 9.5 mg/dL (ref 8.4–10.5)
Chloride: 105 mEq/L (ref 96–112)
Creatinine, Ser: 0.68 mg/dL (ref 0.40–1.20)
GFR: 81.79 mL/min (ref >60.00)
Glucose, Bld: 133 mg/dL — ABNORMAL HIGH (ref 70–99)
Potassium: 3.8 mEq/L (ref 3.5–5.1)
Sodium: 142 mEq/L (ref 135–145)
Total Bilirubin: 1.3 mg/dL — ABNORMAL HIGH (ref 0.2–1.2)
Total Protein: 7.1 g/dL (ref 6.0–8.3)

## 2021-03-31 LAB — VITAMIN D 25 HYDROXY (VIT D DEFICIENCY, FRACTURES): VITD: 51.13 ng/mL (ref 30.00–100.00)

## 2021-03-31 LAB — HEMOGLOBIN A1C: Hgb A1c MFr Bld: 7.1 % — ABNORMAL HIGH (ref 4.6–6.5)

## 2021-04-06 ENCOUNTER — Ambulatory Visit: Payer: Medicare Other | Admitting: Internal Medicine

## 2021-04-06 ENCOUNTER — Other Ambulatory Visit: Payer: Self-pay

## 2021-04-06 ENCOUNTER — Encounter: Payer: Self-pay | Admitting: Internal Medicine

## 2021-04-06 ENCOUNTER — Other Ambulatory Visit: Payer: Self-pay | Admitting: Internal Medicine

## 2021-04-06 DIAGNOSIS — S40021A Contusion of right upper arm, initial encounter: Secondary | ICD-10-CM

## 2021-04-06 NOTE — Patient Instructions (Signed)
  Apply heat to your arm.    Preventing Falls and Fractures  Falls can be very serious, especially for older adults or people with osteoporosis  Falls can be caused by: Tripping or slipping Slow reflexes Balance problems Reduced muscle strength Poor vision or a recent change in prescription Illness and some medications (especially blood pressure pills, diuretics, heart medicines, muscle relaxants and sleep medications) Drinking alcohol  To prevent falls outdoors: Use a can or walker if needed Wear rubber-soled shoes so you don't slip DO NOT buy "shape up" shoes with rocker bottom soles if you have balance problems.  The thick soles and shape make it more difficult to keep your balance. Put kitty litter or salt on icy sidewalks Walk on the grass if the sidewalks are slick Avoid walking on uneven ground whenever possible  T prevent falls indoors: Keep rooms clutter-free, especially hallways, stairs and paths to light switches Remove throw rugs Install night lights, especially to and in the bathroom Turn on lights before going downstairs Keep a flashlight next to your bed Buy a cordless phone to keep with you instead of jumping up to answer the phone Install grab bars in the bathroom near the shower and toilet Install rails on both sides of the stairs.  Make sure the stairs are well lit Wear slippers with non-skid soles.  Do not walk around in stockings or socks  Balance problems and dizziness are not a normal part of growing older.  If you begin having balance problems or dizziness see your doctor.  Physical Therapy can help you with many balance problems, strengthening hip and leg muscles and with gait training.  To keep your bones healthy make sure you are getting enough calcium and Vitamin D each day.  Ask your doctor or pharmacist about supplements.  Regular weight-bearing exercise like walking, lifting weights or dancing can help strengthen bones and prevent  osteoporosis.

## 2021-04-06 NOTE — Progress Notes (Signed)
Subjective:    Patient ID: Anita Duke, female    DOB: 1941-04-01, 80 y.o.   MRN: [id number]  HPI The patient is here for an acute visit.   She fell recently when walking.  She did not pick her foot up completely and tripped on the edge of the sidewalk.  She had multiple little bruises and cuts.  She hit her right upper arm on a pole and has right UEs swelling and bruising from her upper arm to her fingers.  She denies pain but has some discomfort in the middle of the night after laying in one position.  No N/T.  No fevers. She did not hit her head.     On warfarin.   Medications and allergies reviewed with patient and updated if appropriate.  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   Excessive sweating 07/07/2016   Osteopenia 01/10/2016   Heat intolerance 10/08/2015   CAD (coronary artery disease) 05/28/2014   Vitamin D deficiency 02/20/2014   Long term current use of anticoagulant therapy 75/17/0017   Chronic systolic dysfunction of left ventricle 05/21/2013   Cecal cancer, s/p lap r colectomy 11/20/2012   Acquired complete AV block    Sick sinus syndrome ([place])    Pacemaker-Medtronic 05/20/2012   Allergic rhinitis 04/25/2012   Mild intermittent asthma 02/16/2011   Ejection fraction < 50%    Atrial fibrillation    Mitral regurgitation    Right ventricular dysfunction    Mitral stenosis    History of CABG    Diabetes mellitus type II, controlled 03/02/2009   Hyperlipidemia 07/07/2008   Essential hypertension 07/07/2008   Diverticulosis of large intestine 08/04/2002     Current Outpatient Medications on File Prior to Visit  Medication Sig Dispense Refill   ACCU-CHEK SOFTCLIX LANCETS lancets USE TO CHECK BLOOD SUGARS  TWICE A DAY 200 each 2   acetaminophen (TYLENOL) 325 MG tablet Take 650 mg by mouth every 6 (six) hours as needed (pain).      amoxicillin (AMOXIL) 500 MG capsule TAKE 4 CAPSULES PRIOR TO DENTAL APPOINTMENTS AS DIRECTED 40 capsule 0   atorvastatin (LIPITOR) 40 MG tablet TAKE 1 TABLET BY MOUTH  DAILY 90 tablet 3   Blood Glucose Monitoring Suppl (ACCU-CHEK AVIVA PLUS) w/Device KIT Use to check blood sugars daily Dx E11.9 1 kit 0   buPROPion (WELLBUTRIN XL) 300 MG 24 hr tablet TAKE 1 TABLET BY MOUTH  DAILY 90 tablet 3   carvedilol (COREG) 12.5 MG tablet TAKE 1 TABLET BY MOUTH  TWICE DAILY WITH A MEAL 180 tablet 3   cholecalciferol (VITAMIN D) 1000 UNITS tablet Take 1,000 Units by mouth daily.      fish oil-omega-3 fatty acids 1000 MG capsule Take 1 g by mouth 2 (two) times daily.     glucose blood (ACCU-CHEK AVIVA PLUS) test strip USE 2 TIMES DAILY AS  DIRECTED 200 each 2   JARDIANCE 25 MG TABS tablet TAKE 1 TABLET BY MOUTH  DAILY 90 tablet 3   loratadine (CLARITIN) 10 MG tablet Take 10 mg by mouth daily.  spironolactone (ALDACTONE) 25 MG tablet TAKE 1 TABLET BY MOUTH  DAILY 90 tablet 3   trandolapril (MAVIK) 4 MG tablet TAKE 1 TABLET BY MOUTH  TWICE DAILY FOR BLOOD  PRESSURE 180 tablet 1   warfarin (COUMADIN) 2.5 MG tablet Take 2.5 mg by mouth daily.     No current facility-administered medications on file prior to visit.    Past Medical History:  Diagnosis Date   Acquired complete AV block    AV node ablation May 28, 2012    Allergy    mild- uses claritin    Anemia    Hemoglobin 10.4, December, 2013   Arthritis    "not bad; little in my hands; some in my knees" (11/04/2013)   Asthma    B12 deficiency 03/16/2020   Bilateral sensorineural hearing loss 03/29/2017   CAD (coronary artery disease)    LIMA to the LAD at time of  mitral valve repair / LIMA atretic,, February, 2011   Cataracts, bilateral    removed bilat    Cecal cancer 2014   colon   CHF (congestive heart failure)    Chronic systolic dysfunction of left ventricle 05/21/2013   Clotting disorder    21 yrs ago blood clot in heart    Colon polyp, hyperplastic    Diabetes mellitus type II, controlled 03/02/2009   Diabetic neuropathy 02/21/2018   [facility]iat[facility] - [facility]   Diverticulosis of colon    Diverticulosis of large intestine 08/04/2002   Ejection fraction < 50%    EF 30%, New diagnosis   february, 2011, etiology not clear, consider rate related tachycardia, and not use carvedilol, catheterization no constriction, /    EF 55-60% echo, June, 2011 /     Beta blocker stopped May, 2012 with reactive airway disease.  Patient does not tolerate metoprolol.  Carvedilol stopped  /   EF 55%, echo, February, 2012  //   EF 40-45%, septal dyssynergy, hypokinesis of the a   Essential hypertension 07/07/2008   GERD (gastroesophageal reflux disease)    Hemorrhoids, internal    High cholesterol    History of CABG    Hyperlipidemia 07/07/2008   Lumbar radiculopathy, acute 06/17/2018   Major depressive disorder 07/19/2018   Mitral regurgitation    a. s/p repair with LAA ligation and MAZE at time of surgery   Mitral stenosis    Mild, February, 2011, post mitral valve repair / mild, echo, June, 2011  //  Mild functional mitral stenosis, echo, February, 2012  //   mild stenosis, echo, December, 2013  //  mild, echo, August, 2014   //   Mild, echo, April, 2016    Osteopenia    left hip    Pacemaker-Medtronic 05/20/2012   Pacemaker placed September, 2013   //   upgraded to biventricular CRT pacemaker November 04, 2013    Permanent atrial fibrillation    a. s/p MAZE b. s/p AVN ablation   PONV (postoperative nausea and vomiting)    Pulmonary HTN    QT prolongation    Tikosyn and Effexor. QT prolonged October 13, 2011, peak is in dose reduced from 500  to -250 twice a  day   Right ventricular dysfunction    Mild to moderate, echo, February, 2011 / normalized echo, June, 2011 /  RV normal, echo, February, 2012  //   right ventricle reported as good echo, December, 2013    S/P mitral valve repair    [facility] / Maze procedure/  atrial appendage removed were tied off    Sick sinus syndrome    s/p Medtronic dual chamber PPM 05/17/12    Sleep difficulties 09/13/2019   Vitamin D deficiency 02/20/2014    Past Surgical History:  Procedure Laterality Date   ABDOMINAL HYSTERECTOMY  ?2002   AV NODE ABLATION N/A 05/28/2012   Procedure: AV NODE ABLATION;  Surgeon: Thompson Grayer, MD;  Location: [facility] CATH LAB;  Service: Cardiovascular;  Laterality: N/A;   BI-VENTRICULAR PACEMAKER UPGRADE N/A 11/04/2013   upgrade of previously implanted dual chamber pacemaker to [place] by Dr Rayann Heman   BREAST EXCISIONAL BIOPSY Left over 10 years ago   benign   BREAST SURGERY     CARDIOVERSION  02/08/2012   Procedure: CARDIOVERSION;  Surgeon: Carlena Bjornstad, MD;  Location: [place];  Service: Cardiovascular;  Laterality: N/A;   CARDIOVERSION  02/29/2012   Procedure: CARDIOVERSION;  Surgeon: Carlena Bjornstad, MD;  Location: [place];  Service: Cardiovascular;  Laterality: N/A;   CARDIOVERSION  05/15/2012   Procedure: CARDIOVERSION;  Surgeon: Carlena Bjornstad, MD;  Location: [place];  Service: Cardiovascular;  Laterality: N/A;   CATARACT EXTRACTION, BILATERAL  2017   [place]   COLON SURGERY  2014   right hemicolectomy    COLONOSCOPY     CORONARY ARTERY BYPASS GRAFT  2001   CABG X1 "at time of mitral valve repair" (12/04/2012   DIAGNOSTIC LAPAROSCOPIC LIVER BIOPSY Left 12/03/2012   Procedure: DIAGNOSTIC LAPAROSCOPIC LIVER BIOPSY;  Surgeon: Stark Klein, MD;  Location: [place];  Service: General;  Laterality: Left;   EYE SURGERY     bilateral cataract surgery   LAPAROSCOPIC RIGHT HEMI COLECTOMY  12/03/2012   Procedure: LAPAROSCOPIC RIGHT HEMI COLECTOMY;  Surgeon: Stark Klein,  MD;  Location: [place];  Service: General;;   LUMBAR LAMINECTOMY/ DECOMPRESSION WITH MET-RX Left 06/17/2018   Procedure: left Lumbar three-fourextraforaminal Microdiscectomy with Met-Rx;  Surgeon: Kristeen Miss, MD;  Location: [place];  Service: Neurosurgery;  Laterality: Left;   MAZE  2001   w/ MVR & CABG   MITRAL VALVE REPAIR  2001   "anterior and posterior leaflets" (12/04/2012)   PERMANENT PACEMAKER INSERTION N/A 05/17/2012   MDT Adapta L implanted by Dr Rayann Heman for tachy/brady syndrome   POLYPECTOMY     TONSILLECTOMY AND ADENOIDECTOMY  ~ 6295   UMBILICAL HERNIA REPAIR N/A 12/03/2012   Procedure: HERNIA REPAIR UMBILICAL ADULT;  Surgeon: Stark Klein, MD;  Location: [facility];  Service: General;  Laterality: N/A;    Social History   Socioeconomic History   Marital status: Widowed    Spouse name: Not on file   Number of children: 0   Years of education: 12   Highest education level: High school graduate  Occupational History   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: [employer]   Tobacco Use   Smoking status: Never   Smokeless tobacco: Never  Vaping Use   Vaping Use: Never used  Substance and Sexual Activity   Alcohol use: No   Drug use: No   Sexual activity: Not Currently    Birth control/protection: Surgical  Other Topics Concern   Not on file  Social History Narrative   No caffeine   Right handed    Lives alone    Social Determinants of Health   Financial Resource Strain: Not on file  Food Insecurity: Not on file  Transportation  Needs: Not on file  Physical Activity: Not on file  Stress: Not on file  Social Connections: Not on file    Family History  Problem Relation Age of Onset   Heart disease Father    Diabetes Father    Heart disease Sister        x 2   Diabetes Mother    Kidney disease Mother    Thyroid disease Mother    Alzheimer's disease Mother        Symptom onset in late 23s;  confirmed via autopsy   Depression Mother    Pancreatic cancer Maternal Grandfather    Diabetes Paternal Grandfather    Diabetes Paternal Grandmother    Heart disease Paternal Uncle        x 6   Prostate cancer Paternal Uncle    Colon cancer Maternal Aunt    Colon cancer Maternal Aunt    Colon cancer Cousin    Colon polyps Neg Hx    Adrenal disorder Neg Hx    Esophageal cancer Neg Hx    Rectal cancer Neg Hx    Stomach cancer Neg Hx     Review of Systems     Objective:   Vitals:   04/06/21 1042  BP: 124/72  Pulse: 74  Temp: 98 F (36.7 C)  SpO2: 99%   BP Readings from Last 3 Encounters:  04/06/21 124/72  03/22/21 129/68  03/16/21 128/74   Wt Readings from Last 3 Encounters:  04/06/21 149 lb (67.6 kg)  03/22/21 149 lb (67.6 kg)  03/16/21 149 lb (67.6 kg)   Body mass index is 28.15 kg/m.   Physical Exam Constitutional:      General: She is not in acute distress.    Appearance: Normal appearance. She is not ill-appearing.  HENT:     Head: Normocephalic and atraumatic.  Cardiovascular:     Comments: Normal right radial and ulnar pulses Skin:    General: Skin is warm and dry.     Findings: Bruising (orange sized hematoma right mediall mid upper arm.  eccymosis from hematoma to DIP finger joints with mild swelling.  no fluctuance or tenderness) present.  Neurological:     Mental Status: She is alert.     Sensory: No sensory deficit.           Assessment & Plan:    See Problem List for Assessment and Plan of chronic medical problems.    This visit occurred during the SARS-CoV-2 public health emergency.  Safety protocols were in place, including screening questions prior to the visit, additional usage of staff PPE, and extensive cleaning of exam room while observing appropriate contact time as indicated for disinfecting solutions.

## 2021-04-06 NOTE — Assessment & Plan Note (Signed)
Acute Right medial mid upper arm Due to recent fall Associated with ecchymosis and swelling of right arm  No compartment syndrome reassured - will take weeks/ months to resolve Can apply heat   Discussed fall prevention - advised her to all if she does fall - will consider holding warfarin for a day or two in future

## 2021-04-09 ENCOUNTER — Ambulatory Visit
Admission: RE | Admit: 2021-04-09 | Discharge: 2021-04-09 | Disposition: A | Discharge status: Home / Self Care | Payer: Medicare Other | Source: Ambulatory Visit | Attending: Internal Medicine | Admitting: Internal Medicine

## 2021-04-09 ENCOUNTER — Other Ambulatory Visit: Payer: Self-pay

## 2021-04-09 ENCOUNTER — Other Ambulatory Visit: Payer: Self-pay | Admitting: Internal Medicine

## 2021-04-09 DIAGNOSIS — N6320 Unspecified lump in the left breast, unspecified quadrant: Secondary | ICD-10-CM

## 2021-04-11 ENCOUNTER — Ambulatory Visit
Admission: RE | Admit: 2021-04-11 | Discharge: 2021-04-11 | Disposition: A | Discharge status: Home / Self Care | Payer: Medicare Other | Source: Ambulatory Visit | Attending: Internal Medicine | Admitting: Internal Medicine

## 2021-04-11 ENCOUNTER — Other Ambulatory Visit: Payer: Self-pay

## 2021-04-11 DIAGNOSIS — N6320 Unspecified lump in the left breast, unspecified quadrant: Secondary | ICD-10-CM

## 2021-04-12 ENCOUNTER — Ambulatory Visit: Payer: Medicare Other | Admitting: General Practice

## 2021-04-12 DIAGNOSIS — I4891 Unspecified atrial fibrillation: Secondary | ICD-10-CM | POA: Diagnosis not present

## 2021-04-12 DIAGNOSIS — Z7901 Long term (current) use of anticoagulants: Secondary | ICD-10-CM

## 2021-04-12 LAB — POCT INR: INR: 2.5 (ref 2.0–3.0)

## 2021-04-12 NOTE — Patient Instructions (Addendum)
Pre visit review using our clinic review tool, if applicable. No additional management support is needed unless otherwise documented below in the visit note.  Continue to take 1 tablet daily.  Re-check in 4 weeks.

## 2021-04-14 NOTE — Progress Notes (Signed)
Agree with management.  Sylvan Sookdeo J Jacquilyn Seldon, MD  

## 2021-04-15 ENCOUNTER — Encounter: Payer: Self-pay | Admitting: Internal Medicine

## 2021-04-18 ENCOUNTER — Telehealth: Payer: Self-pay

## 2021-04-18 ENCOUNTER — Other Ambulatory Visit: Payer: Self-pay | Admitting: General Surgery

## 2021-04-18 DIAGNOSIS — C50412 Malignant neoplasm of upper-outer quadrant of left female breast: Secondary | ICD-10-CM | POA: Insufficient documentation

## 2021-04-18 DIAGNOSIS — I4821 Permanent atrial fibrillation: Secondary | ICD-10-CM

## 2021-04-18 DIAGNOSIS — Z7901 Long term (current) use of anticoagulants: Secondary | ICD-10-CM | POA: Insufficient documentation

## 2021-04-18 NOTE — Telephone Encounter (Signed)
   [facility] Pre-operative Risk Assessment    Patient Name: Anita Duke  DOB: 1940-09-26 MRN: [id number]  HEARTCARE STAFF:  - IMPORTANT!!!!!! Under Visit Info/Reason for Call, type in Other and utilize the format Clearance MM/DD/YY or Clearance TBD. Do not use dashes or single digits. - Please review there is not already an duplicate clearance open for this procedure. - If request is for dental extraction, please clarify the # of teeth to be extracted. - If the patient is currently at the dentist's office, call Pre-Op Callback Staff (MA/nurse) to input urgent request.  - If the patient is not currently in the dentist office, please route to the Pre-Op pool.  Request for surgical clearance:  What type of surgery is being performed? LEFT BREAST LUMPECTOMY   When is this surgery scheduled? TBD  What type of clearance is required (medical clearance vs. Pharmacy clearance to hold med vs. Both)? PHARMACY  Are there any medications that need to be held prior to surgery and how long? WARFARIN NEEDS INSTRUCTIONS  Practice name and name of physician performing surgery? [facility]; Stark Klein, MD  What is the office phone number? [phone]   7.   What is the office fax number? [phone]  8.   Anesthesia type (None, local, MAC, general) ? GENERAL   Jacinta Shoe 04/18/2021, 5:21 PM  _________________________________________________________________   (provider comments below)

## 2021-04-19 NOTE — Telephone Encounter (Signed)
Will await procedure date to be able to provide dosing/bridging calendar for pt.  This nurse has taken over [name]'s coumadin pts and will f/u with calendar when date is provided.

## 2021-04-19 NOTE — Telephone Encounter (Signed)
    Patient Name: Anita Duke  DOB: [birth year] MRN: [id number]  Primary Cardiologist: Loralie Champagne, MD  Chart reviewed as part of pre-operative protocol coverage.   Patient's coumadin is managed by her PCP. Per our pharmacy recommendations, patient can hold coumadin 5 days prior to her upcoming procedure, though WILL require lovenox bridging. Bridging should be coordinated by patients PCP. Please ensure patient notifies PCP's office once procedure date is scheduled so this can be planned accordingly.  I will route this recommendation to the requesting party via Epic fax function and remove from pre-op pool.  Please call with questions.  Abigail Butts, PA-C 04/19/2021, 10:35 AM

## 2021-04-19 NOTE — Telephone Encounter (Signed)
Patient with diagnosis of afib on warfarin for anticoagulation.    Procedure: left breast lumpectomy Date of procedure: TBD  CHA2DS2-VASc Score = 7  This indicates a 11.2% annual risk of stroke. The patient's score is based upon: CHF History: Yes HTN History: Yes Diabetes History: Yes Stroke History: No Vascular Disease History: Yes Age Score: 2 Gender Score: 1   CrCl 61m/min using adjusted body weight Platelet count 187K  Per office protocol, patient can hold warfarin for 5 days prior to procedure. Patient will need bridging with Lovenox (enoxaparin) around procedure. She has been bridged in the past for procedures. This should be coordinated by PCP who manages patient's warfarin. Will forward to Coumadin clinic at PCP to help arrange. Would recommend dosing at 1.'5mg'$ /kg once daily (enoxaparin '100mg'$  daily).

## 2021-04-20 ENCOUNTER — Telehealth: Payer: Self-pay | Admitting: Hematology

## 2021-04-20 NOTE — Telephone Encounter (Signed)
Received medonc and genetics referrals from Dr. Barry Dienes for breast cancer. Anita Duke has been cld and scheduled to see Dr. Burr Medico on 8/25 at 3pm and genetics at 4pm.

## 2021-04-21 ENCOUNTER — Telehealth: Payer: Self-pay | Admitting: Internal Medicine

## 2021-04-21 NOTE — Telephone Encounter (Signed)
Contacted pt, who wanted information concerning dosing coumadin for her upcoming surgery. Documented in encounter already started from 8/15 from cardiology office.

## 2021-04-21 NOTE — Telephone Encounter (Signed)
Sent to the wrong person. Please see below

## 2021-04-21 NOTE — Telephone Encounter (Signed)
She is not a pt at [facility]. Looks like [facility] has been doing her coumadin. Or did yo mean to send this to Randall An, RN?

## 2021-04-21 NOTE — Telephone Encounter (Signed)
Pt contacted office today requesting this office advise on dosing coumadin and lovenox if necessary for upcoming surgery, breast lumpectomy. She is comfortable with PCP office taking care of coumadin/lovenox dosing. Advised pt this nurse would provide a dosing calendar and forward it to Dr. Quay Burow for her approval and then f/u with her to get her scheduled for her next INR check and to go over dosing calendar. Pt verbalized understanding.   Creatinine clearance is 69.24 mL/min using labs dated 03/31/21. Pt weight is 67.6kg.  Lovenox should be 1.'5mg'$ /kg and be dosed at 100 mg every AM. Currently coumadin dosing is 2.'5mg'$  daily with a therapeutic range of 2-3. Last INR 2.5 on 04/12/21. TTR is 65.4% Proposed dosing calendar below  9/17: Last dose of coumadin before surger, 2.'5mg'$  9/18: No coumadin, no lovenox 9/19: No coumadin, Lovenox AM 9/20: No coumadin, Lovenox AM 9/21: No coumadin, Lovenox AM (before 7am)  9/22: Surgery Day: NO COUMADIN AND NO LOVENOX  9/23: Take '5mg'$  (2 tablets) coumadin, Lovenox AM  9/24: Take '5mg'$  (2 tablets) coumadin, Lovenox AM  9/25: Take '5mg'$  (2 tablets) coumadin, Lovenox AM  9/26: Take 3.'75mg'$  (1 & 1/2 tablets) coumadin, Lovenox AM  9/27: Resume normal dosing of coumadin, No lovenox 9/28: Normal coumadin dosing, No lovenox 9/29: Recheck INR

## 2021-04-21 NOTE — Telephone Encounter (Signed)
Pt is scheduled for an upcoming surgery and she has some questions regarding her coumadin. Requesting a callback.

## 2021-04-22 ENCOUNTER — Other Ambulatory Visit: Payer: Self-pay | Admitting: Internal Medicine

## 2021-04-23 NOTE — Telephone Encounter (Signed)
Looks perfect.  Let me know if you need me to do anything.

## 2021-04-25 ENCOUNTER — Other Ambulatory Visit: Payer: Self-pay | Admitting: Internal Medicine

## 2021-04-25 ENCOUNTER — Ambulatory Visit (INDEPENDENT_AMBULATORY_CARE_PROVIDER_SITE_OTHER): Payer: Medicare Other

## 2021-04-25 DIAGNOSIS — Z950 Presence of cardiac pacemaker: Secondary | ICD-10-CM

## 2021-04-25 DIAGNOSIS — I5022 Chronic systolic (congestive) heart failure: Secondary | ICD-10-CM

## 2021-04-25 NOTE — Progress Notes (Signed)
Remote pacemaker transmission.   

## 2021-04-26 NOTE — Telephone Encounter (Signed)
Pt called back to discuss the coumadin and lovenox schedule. Also she wanted to see if she should still come in before 05/26/2021.   Best callback #: [phone]

## 2021-04-27 ENCOUNTER — Other Ambulatory Visit (HOSPITAL_COMMUNITY): Payer: Self-pay

## 2021-04-27 DIAGNOSIS — C50112 Malignant neoplasm of central portion of left female breast: Secondary | ICD-10-CM | POA: Insufficient documentation

## 2021-04-27 MED ORDER — ENOXAPARIN SODIUM 100 MG/ML IJ SOSY
PREFILLED_SYRINGE | INTRAMUSCULAR | 0 refills | Status: DC
  Filled 2021-04-27: qty 7, 7d supply, fill #0

## 2021-04-27 NOTE — Telephone Encounter (Signed)
Advised pt of lovenox, she would like sent to [facility]. Made apt at [facility] for coumadin clinic for 9/13 before procedure and for 9/28 at [facility] for after the procedure. Advised if anything is needed before apt to contact the office. Pt appreciative and verbalized understanding.   Sent in script. All apts have been scheduled.

## 2021-04-27 NOTE — Addendum Note (Signed)
Addended by: Randall An on: 04/27/2021 03:32 PM   Modules accepted: Orders

## 2021-04-27 NOTE — Progress Notes (Signed)
EPIC Encounter for ICM Monitoring  Patient Name: Lucielle Ito is a 80 y.o. female Date: 04/27/2021 Primary Care Physican: Binnie Rail, MD Primary Cardiologist: Varanasi/McLean Electrophysiologist: Allred Bi-V Pacing:  97%         03/22/2021 Weight: 149 lbs                                  Spoke with patient and heart failure questions reviewed.  Pt asymptomatic for fluid accumulation and feeling well.  She has dx breast cancer and scheduled for lumpectomy.     [place] Thoracic impedance normal.   Prescribed: Spironolactone 25 mg take 1 tablet daily   Labs: 03/31/2021 Creatinine 0.68, BUN 19, Potassium 3.8, Sodium 142, GFR 81.79 03/22/2021 Creatinine 0.68, BUN 17, Potassium 3.8, Sodium 140, GFR >60 A complete set of results can be found in Results Review.   Recommendations:  No changes and encouraged to call if experiencing any fluid symptoms.   Follow-up plan: ICM clinic phone appointment on 06/06/2021.   91 day device clinic remote transmission 06/28/2021.   EP/Cardiology Office Visits:   06/20/2021 with Dr Rayann Heman.     Copy of ICM check sent to Dr. Rayann Heman.     3 month ICM trend: 04/25/2021.    1 Year ICM trend:       [name], RN 04/27/2021 3:54 PM

## 2021-04-27 NOTE — Telephone Encounter (Signed)
Contacted pt concerning lovenox bridge for upcoming surgery. She asked that this nurse call back this afternoon because she is out and does not have her calendar with her. Advised this nurse will call her this afternoon. Pt appreciative and verbalized understanding.   Pt will need 7 lovenox syringes, '100mg'$  sent to pharmacy. Need to schedule recheck before her surgery because it will be over 7 wks since her last INR. Advised pt to look at dates 9/6 and 9/13.  Need to inquire where pt want lovenox sent.  Need to schedule next INR check at [facility] before surgery.

## 2021-04-28 ENCOUNTER — Encounter: Payer: Self-pay | Admitting: Hematology

## 2021-04-28 ENCOUNTER — Inpatient Hospital Stay: Payer: Medicare Other

## 2021-04-28 ENCOUNTER — Other Ambulatory Visit: Payer: Self-pay | Admitting: Genetic Counselor

## 2021-04-28 ENCOUNTER — Other Ambulatory Visit: Payer: Self-pay

## 2021-04-28 ENCOUNTER — Inpatient Hospital Stay: Payer: Medicare Other | Attending: Hematology | Admitting: Hematology

## 2021-04-28 ENCOUNTER — Inpatient Hospital Stay (HOSPITAL_BASED_OUTPATIENT_CLINIC_OR_DEPARTMENT_OTHER): Payer: Medicare Other | Admitting: Genetic Counselor

## 2021-04-28 VITALS — BP 162/58 | HR 68 | Temp 98.0°F | Resp 19 | Ht 61.0 in | Wt 151.0 lb

## 2021-04-28 DIAGNOSIS — Z170 Estrogen receptor positive status [ER+]: Secondary | ICD-10-CM | POA: Insufficient documentation

## 2021-04-28 DIAGNOSIS — C50112 Malignant neoplasm of central portion of left female breast: Secondary | ICD-10-CM | POA: Insufficient documentation

## 2021-04-28 DIAGNOSIS — Z85038 Personal history of other malignant neoplasm of large intestine: Secondary | ICD-10-CM | POA: Diagnosis not present

## 2021-04-28 DIAGNOSIS — C180 Malignant neoplasm of cecum: Secondary | ICD-10-CM

## 2021-04-28 DIAGNOSIS — Z800 Family history of malignant neoplasm of digestive organs: Secondary | ICD-10-CM | POA: Diagnosis not present

## 2021-04-28 DIAGNOSIS — Z8042 Family history of malignant neoplasm of prostate: Secondary | ICD-10-CM | POA: Diagnosis not present

## 2021-04-28 DIAGNOSIS — M8580 Other specified disorders of bone density and structure, unspecified site: Secondary | ICD-10-CM | POA: Diagnosis not present

## 2021-04-28 DIAGNOSIS — Z950 Presence of cardiac pacemaker: Secondary | ICD-10-CM | POA: Insufficient documentation

## 2021-04-28 DIAGNOSIS — Z86010 Personal history of colonic polyps: Secondary | ICD-10-CM

## 2021-04-28 LAB — CMP (CANCER CENTER ONLY)
ALT: 15 U/L (ref 0–44)
AST: 20 U/L (ref 15–41)
Albumin: 4.5 g/dL (ref 3.5–5.0)
Alkaline Phosphatase: 70 U/L (ref 38–126)
Anion gap: 7 (ref 5–15)
BUN: 14 mg/dL (ref 8–23)
CO2: 29 mmol/L (ref 22–32)
Calcium: 9.7 mg/dL (ref 8.9–10.3)
Chloride: 108 mmol/L (ref 98–111)
Creatinine: 0.56 mg/dL (ref 0.44–1.00)
GFR, Estimated: >60 mL/min (ref >60)
Glucose, Bld: 152 mg/dL — ABNORMAL HIGH (ref 70–99)
Potassium: 3.4 mmol/L — ABNORMAL LOW (ref 3.5–5.1)
Sodium: 144 mmol/L (ref 135–145)
Total Bilirubin: 1 mg/dL (ref 0.3–1.2)
Total Protein: 7.3 g/dL (ref 6.5–8.1)

## 2021-04-28 LAB — GENETIC SCREENING ORDER

## 2021-04-28 NOTE — Progress Notes (Signed)
[place]   [phone]) [phone] Fax:([phone]) [place] Note   Patient Care Team: Binnie Rail, MD as PCP - General (Internal Medicine) Larey Dresser, MD as PCP - Cardiology (Cardiology) Carlena Bjornstad, MD as Consulting Physician (Cardiology) Inda Castle, MD (Inactive) as Consulting Physician (Gastroenterology) Cameron Sprang, MD as Consulting Physician (Neurology)  Date of Service:  04/28/2021   CHIEF COMPLAINTS/PURPOSE OF CONSULTATION:  Left Breast Cancer, ER+  REFERRING PHYSICIAN:  Dr. Stark Klein  ASSESSMENT & PLAN:  Anita Duke is a 80 y.o.  female with   1. 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  ER and PR strongly positive, with low Ki-67. -I discussed her breast imaging and needle biopsy results with patient in great detail. -She is a candidate for breast conservation surgery. She has been seen by breast surgeon Dr. Barry Dienes, who recommends lumpectomy.  Her surgery is scheduled for next months. -Given her strong family history of cancer, we recommend her to undergo genetic testing to ruled out inheritable breast cancer. She is scheduled to meet our genetic counselor today. -Given her advanced age, small tumor, and strongly ER/PR positivity, this is likely low risk disease, I do not recommend Oncotype. -Given her strongly positive ER and PR, I recommend antiestrogen therapy with tamoxifen, which decrease her risk of future breast cancer by ~40%.  --The potential side effects, which includes but not limited to, hot flash, skin and vaginal dryness, slightly increased risk of cardiovascular disease and cataract, small risk of thrombosis and endometrial cancer, were discussed with her in great details. Preventive strategies for  thrombosis, such as being physically active, using compression stocks, avoid cigarette smoking, etc., were reviewed with her.  She has had a hysterectomy, no risk of endometrial cancer.  She voiced good understanding, and agrees to proceed. Will start after she completes adjuvant breast radiation. -Given her history of heart problems and pacemaker, as well as her age and small tumor size, I think it's reasonable to forego adjuvant breast radiation, will review in tumor board. -We discussed breast cancer surveillance after she completes treatment, Including annual mammogram, breast exam every 6-12 months.  2. Osteopenia -Her most recent DEXA was 03/31/21 showing -1.4. -We discussed tamoxifen can strengthen the bones.  3. H/o Cecal Adenocarcinoma, stage I p(T2, N0) -found on screening colonoscopy -s/p partial colectomy on 12/03/12 by Dr. Barry Dienes -evaluated by Dr. Jana Hakim, no further treatment needed.   PLAN:  -proceed to genetics this afternoon -proceed with lumpectomy on 05/26/21 with Dr. Barry Dienes -I'll see her 2-3 weeks after her surgery to review her pathology result and finalize her antiestrogen therapy.     Oncology History  Malignant neoplasm of central portion of left breast ([place])  04/09/2021 Mammogram   Bilateral Diagnostic, and Left Breast US  FINDINGS: Spiculated mass in the left breast containing calcifications at 3 o'clock, 6 cm from the nipple measuring 11 x 9 x 9 mm. No axillary adenopathy.     04/11/2021 Cancer Staging   Staging form: Breast, AJCC 8th Edition - Clinical stage from 04/11/2021: Stage IA (cT1c, cN0, cM0, G2, ER+, PR+, HER2-) - Signed by Truitt Merle, MD on 04/27/2021 Stage prefix: Initial diagnosis Histologic grading system: 3 grade system   04/11/2021 Pathology Results   Diagnosis Breast, left, needle core biopsy, 3:00 -  INVASIVE DUCTAL CARCINOMA, SEE COMMENT. - DUCTAL CARCINOMA IN SITU. Microscopic Comment The carcinoma appears grade 2.  PROGNOSTIC  INDICATORS Results: IMMUNOHISTOCHEMICAL AND MORPHOMETRIC ANALYSIS PERFORMED MANUALLY The tumor cells are NEGATIVE for Her2 (1+). Estrogen Receptor: 100%, POSITIVE, STRONG STAINING INTENSITY Progesterone Receptor: 100%, POSITIVE, STRONG STAINING INTENSITY Proliferation Marker Ki67: 5%   04/27/2021 Initial Diagnosis   Malignant neoplasm of central portion of left breast ([place])      HISTORY OF PRESENTING ILLNESS:  Anita Duke 80 y.o. female is a here because of breast cancer. The patient was referred by [facility]. The patient presents to the clinic today alone.   She presented for routine screening mammography with a palpable left breast lump. She underwent bilateral diagnostic mammography and left breast ultrasonography on 04/09/21 showing: suspicious 11 mm mass containing calcifications in left breast at 3 o'clock.  Biopsy on 04/11/21 showed: invasive ductal carcinoma, grade 2; DCIS. Prognostic indicators significant for: estrogen receptor, 100% positive and progesterone receptor, 100% positive. Proliferation marker Ki67 at 5%. HER2 negative.    Today the patient notes they felt/feeling prior/after... -she has hot flashes   She has a PMHx of.... -DM, HTN -heart disease and Afib, s/p open heart surgery, mitral valve procedure, pacemaker placement -arthritis in her hands -diverticulosis -s/p cholecystectomy   Socially... -She lost her husband about 4 years ago to a form of head and neck cancer. He was also treated here, so she is familiar with the building. -she reports colon cancer in two maternal aunts, prostate cancer in a paternal uncle, and pancreatic cancer in [place] -She notes she has outlived all of her family members-- one sister died from overdose, one from heart problems   GYN HISTORY  Menarchal: xx LMP: xx Contraceptive: HRT:  GP: 0 S/p hysterectomy   REVIEW OF SYSTEMS:    Constitutional: Denies fevers, chills or abnormal night sweats Eyes: Denies  blurriness of vision, double vision or watery eyes Ears, nose, mouth, throat, and face: Denies mucositis or sore throat Respiratory: Denies cough, dyspnea or wheezes Cardiovascular: Denies palpitation, chest discomfort or lower extremity swelling Gastrointestinal:  Denies nausea, heartburn or change in bowel habits Skin: Denies abnormal skin rashes Lymphatics: Denies new lymphadenopathy or easy bruising Neurological:Denies numbness, tingling or new weaknesses Behavioral/Psych: Mood is stable, no new changes  All other systems were reviewed with the patient and are negative.   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   Breast cancer ([place])    CAD (coronary artery disease)    LIMA to the LAD at time of mitral valve repair / LIMA atretic,, February, 2011   Cataracts, bilateral    removed bilat    Cecal cancer 2014   colon   CHF (congestive heart failure)    Chronic systolic dysfunction of left ventricle 05/21/2013   Clotting disorder    21 yrs ago blood clot in heart    Colon cancer ([place])    Colon polyp, hyperplastic    Diabetes mellitus type II, controlled 03/02/2009   Diabetic neuropathy 02/21/2018   [facility] - [facility]   Diverticulosis of  colon    Diverticulosis of large intestine 08/04/2002   Ejection fraction < 50%    EF 30%, New diagnosis   february, 2011, etiology not clear, consider rate related tachycardia, and not use carvedilol, catheterization no constriction, /    EF 55-60% echo, June, 2011 /     Beta blocker stopped May, 2012 with reactive airway disease.  Patient does not tolerate metoprolol.  Carvedilol stopped  /   EF 55%, echo,  February, 2012  //   EF 40-45%, septal dyssynergy, hypokinesis of the a   Essential hypertension 07/07/2008   GERD (gastroesophageal reflux disease)    Hemorrhoids, internal    High cholesterol    History of CABG    Hyperlipidemia 07/07/2008   Lumbar radiculopathy, acute 06/17/2018   Major depressive disorder 07/19/2018   Mitral regurgitation    a. s/p repair with LAA ligation and MAZE at time of surgery   Mitral stenosis    Mild, February, 2011, post mitral valve repair / mild, echo, June, 2011  //  Mild functional mitral stenosis, echo, February, 2012  //   mild stenosis, echo, December, 2013  //  mild, echo, August, 2014   //   Mild, echo, April, 2016    Osteopenia    left hip    Pacemaker-Medtronic 05/20/2012   Pacemaker placed September, 2013   //   upgraded to biventricular CRT pacemaker November 04, 2013    Permanent atrial fibrillation    a. s/p MAZE b. s/p AVN ablation   PONV (postoperative nausea and vomiting)    Pulmonary HTN    QT prolongation    Tikosyn and Effexor. QT prolonged October 13, 2011, peak is in dose reduced from 500  to -250 twice a day   Right ventricular dysfunction    Mild to moderate, echo, February, 2011 / normalized echo, June, 2011 /  RV normal, echo, February, 2012  //   right ventricle reported as good echo, December, 2013    S/P mitral valve repair    [facility] / Maze procedure/ atrial appendage removed were tied off    Sick sinus syndrome    s/p Medtronic dual chamber PPM 05/17/12    Sleep difficulties 09/13/2019   Vitamin D deficiency 02/20/2014    SURGICAL HISTORY: Past Surgical History:  Procedure Laterality Date   ABDOMINAL HYSTERECTOMY  ?2002   AV NODE ABLATION N/A 05/28/2012   Procedure: AV NODE ABLATION;  Surgeon: Thompson Grayer, MD;  Location: [facility] CATH LAB;  Service: Cardiovascular;  Laterality: N/A;   BI-VENTRICULAR PACEMAKER UPGRADE N/A 11/04/2013   upgrade of previously implanted dual chamber pacemaker to [place] by Dr  Rayann Heman   BREAST EXCISIONAL BIOPSY Left over 10 years ago   benign   BREAST SURGERY     CARDIOVERSION  02/08/2012   Procedure: CARDIOVERSION;  Surgeon: Carlena Bjornstad, MD;  Location: [place];  Service: Cardiovascular;  Laterality: N/A;   CARDIOVERSION  02/29/2012   Procedure: CARDIOVERSION;  Surgeon: Carlena Bjornstad, MD;  Location: [place];  Service: Cardiovascular;  Laterality: N/A;   CARDIOVERSION  05/15/2012   Procedure: CARDIOVERSION;  Surgeon: Carlena Bjornstad, MD;  Location: [facility] ENDOSCOPY;  Service: Cardiovascular;  Laterality: N/A;   CATARACT EXTRACTION, BILATERAL  2017   [place]   COLON SURGERY  2014   right hemicolectomy    COLONOSCOPY     CORONARY ARTERY BYPASS GRAFT  2001   CABG X1 "at time of mitral valve repair" (12/04/2012  DIAGNOSTIC LAPAROSCOPIC LIVER BIOPSY Left 12/03/2012   Procedure: DIAGNOSTIC LAPAROSCOPIC LIVER BIOPSY;  Surgeon: Stark Klein, MD;  Location: [place];  Service: General;  Laterality: Left;   EYE SURGERY     bilateral cataract surgery   LAPAROSCOPIC RIGHT HEMI COLECTOMY  12/03/2012   Procedure: LAPAROSCOPIC RIGHT HEMI COLECTOMY;  Surgeon: Stark Klein, MD;  Location: [place];  Service: General;;   LUMBAR LAMINECTOMY/ DECOMPRESSION WITH MET-RX Left 06/17/2018   Procedure: left Lumbar three-fourextraforaminal Microdiscectomy with Met-Rx;  Surgeon: Kristeen Miss, MD;  Location: [place];  Service: Neurosurgery;  Laterality: Left;   MAZE  2001   w/ MVR & CABG   MITRAL VALVE REPAIR  2001   "anterior and posterior leaflets" (12/04/2012)   PERMANENT PACEMAKER INSERTION N/A 05/17/2012   MDT Adapta L implanted by Dr Rayann Heman for tachy/brady syndrome   POLYPECTOMY     TONSILLECTOMY AND ADENOIDECTOMY  ~ 1610   UMBILICAL HERNIA REPAIR N/A 12/03/2012   Procedure: HERNIA REPAIR UMBILICAL ADULT;  Surgeon: Stark Klein, MD;  Location: [place];  Service: General;  Laterality: N/A;    SOCIAL HISTORY: Social History   Socioeconomic History   Marital status: Widowed    Spouse  name: Not on file   Number of children: 0   Years of education: 12   Highest education level: High school graduate  Occupational History   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: [employer]   Tobacco Use   Smoking status: Never   Smokeless tobacco: Never  Vaping Use   Vaping Use: Never used  Substance and Sexual Activity   Alcohol use: No   Drug use: No   Sexual activity: Not Currently    Birth control/protection: Surgical  Other Topics Concern   Not on file  Social History Narrative   No caffeine   Right handed    Lives alone    Social Determinants of Health   Financial Resource Strain: Not on file  Food Insecurity: Not on file  Transportation Needs: Not on file  Physical Activity: Not on file  Stress: Not on file  Social Connections: Not on file  Intimate Partner Violence: Not on file    FAMILY HISTORY: Family History  Problem Relation Age of Onset   Heart disease Father    Diabetes Father    Heart disease Sister        x 2   Diabetes Mother    Kidney disease Mother    Thyroid disease Mother    Alzheimer's disease Mother        Symptom onset in late 47s; confirmed via autopsy   Depression Mother    Pancreatic cancer Maternal Grandfather    Diabetes Paternal Grandfather    Diabetes Paternal Grandmother    Heart disease Paternal Uncle        x 6   Prostate cancer Paternal Uncle    Colon cancer Maternal Aunt    Colon cancer Maternal Aunt    Colon cancer Cousin    Colon polyps Neg Hx    Adrenal disorder Neg Hx    Esophageal cancer Neg Hx    Rectal cancer Neg Hx    Stomach cancer Neg Hx     ALLERGIES:  is allergic to demerol [meperidine], morphine, januvia [sitagliptin], metformin and related, and tetanus toxoid.  MEDICATIONS:  Current Outpatient Medications  Medication Sig Dispense Refill   ACCU-CHEK SOFTCLIX LANCETS lancets USE TO CHECK  BLOOD SUGARS  TWICE A DAY 200 each 2    acetaminophen (TYLENOL) 325 MG tablet Take 650 mg by mouth every 6 (six) hours as needed (pain).      amoxicillin (AMOXIL) 500 MG capsule TAKE 4 CAPSULES PRIOR TO DENTAL APPOINTMENTS AS DIRECTED 40 capsule 0   atorvastatin (LIPITOR) 40 MG tablet TAKE 1 TABLET BY MOUTH  DAILY 90 tablet 3   Blood Glucose Monitoring Suppl (ACCU-CHEK AVIVA PLUS) w/Device KIT Use to check blood sugars daily Dx E11.9 1 kit 0   buPROPion (WELLBUTRIN XL) 300 MG 24 hr tablet TAKE 1 TABLET BY MOUTH  DAILY 90 tablet 3   carvedilol (COREG) 12.5 MG tablet TAKE 1 TABLET BY MOUTH  TWICE DAILY WITH A MEAL 180 tablet 3   cholecalciferol (VITAMIN D) 1000 UNITS tablet Take 1,000 Units by mouth daily.      enoxaparin (LOVENOX) 100 MG/ML injection Inject 1 mL (100 mg total) into the skin as directed by anticoagulation clinic. 7 mL 0   fish oil-omega-3 fatty acids 1000 MG capsule Take 1 g by mouth 2 (two) times daily.     glucose blood (ACCU-CHEK AVIVA PLUS) test strip USE 2 TIMES DAILY AS  DIRECTED 200 each 2   JARDIANCE 25 MG TABS tablet TAKE 1 TABLET BY MOUTH  DAILY 90 tablet 3   loratadine (CLARITIN) 10 MG tablet Take 10 mg by mouth daily.     spironolactone (ALDACTONE) 25 MG tablet TAKE 1 TABLET BY MOUTH  DAILY 90 tablet 3   trandolapril (MAVIK) 4 MG tablet TAKE 1 TABLET BY MOUTH  TWICE DAILY FOR BLOOD  PRESSURE 180 tablet 1   warfarin (COUMADIN) 2.5 MG tablet Take 2.5 mg by mouth daily.     No current facility-administered medications for this visit.    PHYSICAL EXAMINATION: ECOG PERFORMANCE STATUS: 0 - Asymptomatic  Vitals:   04/28/21 1440  BP: (!) 162/58  Pulse: 68  Resp: 19  Temp: 98 F (36.7 C)  SpO2: 100%   Filed Weights   04/28/21 1440  Weight: 151 lb (68.5 kg)    GENERAL:alert, no distress and comfortable SKIN: skin color, texture, turgor are normal, no rashes or significant lesions EYES: normal, Conjunctiva are pink and non-injected, sclera clear  NECK: supple, thyroid normal size, non-tender, without  nodularity LYMPH:  no palpable lymphadenopathy in the cervical, axillary  LUNGS: clear to auscultation and percussion with normal breathing effort HEART: regular rate & rhythm and no murmurs and no lower extremity edema ABDOMEN:abdomen soft, non-tender and normal bowel sounds Musculoskeletal:no cyanosis of digits and no clubbing  NEURO: alert & oriented x 3 with fluent speech, no focal motor/sensory deficits BREAST: 4 x 2 cm mass in left breast at 3 o'clock, with associated diffuse ecchymosis; No palpable adenopathy bilaterally.   LABORATORY DATA:  I have reviewed the data as listed CBC Latest Ref Rng & Units 03/31/2021 03/22/2021 09/16/2020  WBC 4.0 - 10.5 K/uL 6.3 7.4 6.1  Hemoglobin 12.0 - 15.0 g/dL 13.5 14.6 14.5  Hematocrit 36.0 - 46.0 % 40.4 44.7 43.4  Platelets 150.0 - 400.0 K/uL 187.0 187 191.0    CMP Latest Ref Rng & Units 04/28/2021 03/31/2021 03/22/2021  Glucose 70 - 99 mg/dL 152(H) 133(H) 123(H)  BUN 8 - 23 mg/dL _0 Creatinine 0.44 - 1.00 mg/dL 0.56 0.68 0.68  Sodium 135 - 145 mmol/L 144 142 140  Potassium 3.5 - 5.1 mmol/L 3.4(L) 3.8 3.8  Chloride 98 - 111 mmol/L 108 105 105  CO2 22 - 32 mmol/L _0 Calcium 8.9 - 10.3 mg/dL 9.7 9.5 9.7  Total Protein 6.5 - 8.1 g/dL 7.3 7.1 -  Total Bilirubin 0.3 - 1.2 mg/dL 1.0 1.3(H) -  Alkaline Phos 38 - 126 U/L 70 66 -  AST 15 - 41 U/L 20 17 -  ALT 0 - 44 U/L 15 15 -     RADIOGRAPHIC STUDIES: I have personally reviewed the radiological images as listed and agreed with the findings in the report. DG Bone Density  Result Date: 03/31/2021 Formatting of this result is different from the original. Date of study: 03/31/2021 Exam: DUAL X-RAY ABSORPTIOMETRY (DXA) FOR BONE MINERAL DENSITY (BMD) Instrument: [employer] Requesting Provider: PCP Indication: follow up for low BMD Comparison: 2020 Clinical data: Pt is a 80 y.o. female without previous history of fracture. Results:  Lumbar spine L1-L4(L2)(L4) Femoral neck (FN)  T-score 1.5 RFN: - 0.9 LFN: -1.4 Change in BMD from previous DXA test (%) Up 18.1% Up 1.7% (*) statistically significant Assessment: By the [facility] Criteria for diagnosis based on bone density, this patient has Low Bone Density Z Score compares the patients bone density to age, sex, and race matched controls. FRAX 10-year fracture risk calculator: 12.8 % for any major fracture and 3.0 % for hip fracture.  Pharmacologic therapy is recommended if 10 year fracture risk is >20% for any major osteoporotic fracture or >3% for hip fracture.   L2, L3 vertebrae had to be excluded from analysis due to prior laminectomy Comments: the technical quality of the study is good. WHO criteria for diagnosis of osteoporosis in postmenopausal women and in men 46 y/o or older: - normal: T-score -1.0 to + 1.0 - osteopenia/low bone density: T-score between -2.5 and -1.0 - osteoporosis: T-score below -2.5 - severe osteoporosis: T-score below -2.5 with history of fragility fracture Note: although not part of the WHO classification, the presence of a fragility fracture, regardless of the T-score, should be considered diagnostic of osteoporosis, provided other causes for the fracture have been excluded. RECOMMENDATION: 1. All patients should optimize calcium and vitamin D intake. 2. Consider FDA-approved medical therapies in postmenopausal women and men aged 61 years and older, based on the following: a. A hip or vertebral(clinical or morphometric) fracture. b. T-Score of  -2.5 or less at the femoral neck , total hip or spine after appropriate evaluation to exclude secondary causes c. Low bone mass (T-score between -1.0 and -2.5 at the femoral neck or spine) and a 10 year probability of a hip fracture >3% or a 10 year probability of major osteoporosis-related fracture > 20% based on the [place]-adapted WHO algorithm d. Clinical judgement and/or patient preferences may indicate treatment for people with 10-year fracture probabilities above or below  these levels Follow up BMD is recommended: 2 years. Interpreted by : Mack Guise, MD [facility]   US BREAST LTD UNI LEFT INC AXILLA  Result Date: 04/09/2021 CLINICAL DATA:  Left breast lump EXAM: DIGITAL DIAGNOSTIC BILATERAL MAMMOGRAM WITH TOMOSYNTHESIS AND CAD; ULTRASOUND LEFT BREAST LIMITED TECHNIQUE: Bilateral digital diagnostic mammography and breast tomosynthesis was performed. The images were evaluated with computer-aided detection.; Targeted ultrasound examination of the left breast was performed. COMPARISON:  Previous exam(s). ACR Breast Density Category c: The breast tissue is heterogeneously dense, which may obscure small masses. FINDINGS: There is a spiculated mass in the region of the patient's left breast lump. No other suspicious findings are identified in either breast. On physical exam, there is a  palpable lump in the lateral left breast. Targeted ultrasound is performed, showing a spiculated mass in the left breast containing calcifications at 3 o'clock, 6 cm from the nipple measuring 11 x 9 x 9 mm. No axillary adenopathy. IMPRESSION: Suspicious left breast mass.  No other suspicious findings. RECOMMENDATION: Recommend ultrasound-guided biopsy of the suspicious left breast mass. The mass is at the site of a previous biopsy for calcifications which demonstrated usual ductal hyperplasia and fibrocystic change. I have discussed the findings and recommendations with the patient. If applicable, a reminder letter will be sent to the patient regarding the next appointment. BI-RADS CATEGORY  4: Suspicious. Electronically Signed   By: Dorise Bullion III M.D   On: 04/09/2021 10:34  MM DIAG BREAST TOMO BILATERAL  Result Date: 04/09/2021 CLINICAL DATA:  Left breast lump EXAM: DIGITAL DIAGNOSTIC BILATERAL MAMMOGRAM WITH TOMOSYNTHESIS AND CAD; ULTRASOUND LEFT BREAST LIMITED TECHNIQUE: Bilateral digital diagnostic mammography and breast tomosynthesis was performed. The images were  evaluated with computer-aided detection.; Targeted ultrasound examination of the left breast was performed. COMPARISON:  Previous exam(s). ACR Breast Density Category c: The breast tissue is heterogeneously dense, which may obscure small masses. FINDINGS: There is a spiculated mass in the region of the patient's left breast lump. No other suspicious findings are identified in either breast. On physical exam, there is a palpable lump in the lateral left breast. Targeted ultrasound is performed, showing a spiculated mass in the left breast containing calcifications at 3 o'clock, 6 cm from the nipple measuring 11 x 9 x 9 mm. No axillary adenopathy. IMPRESSION: Suspicious left breast mass.  No other suspicious findings. RECOMMENDATION: Recommend ultrasound-guided biopsy of the suspicious left breast mass. The mass is at the site of a previous biopsy for calcifications which demonstrated usual ductal hyperplasia and fibrocystic change. I have discussed the findings and recommendations with the patient. If applicable, a reminder letter will be sent to the patient regarding the next appointment. BI-RADS CATEGORY  4: Suspicious. Electronically Signed   By: Dorise Bullion III M.D   On: 04/09/2021 10:34  MM CLIP PLACEMENT LEFT  Result Date: 04/11/2021 CLINICAL DATA: Evaluate biopsy marker. EXAM: 3D DIAGNOSTIC LEFT MAMMOGRAM POST ULTRASOUND BIOPSY COMPARISON:  Previous exam(s). FINDINGS: 3D Mammographic images were obtained following ultrasound guided biopsy of a left breast mass. The biopsy marking clip is in expected position at the site of biopsy. IMPRESSION: Appropriate positioning of the ribbon shaped biopsy marking clip at the site of biopsy in the biopsied left breast mass. Final Assessment: Post Procedure Mammograms for Marker Placement Electronically Signed   By: Dorise Bullion III M.D   On: 04/11/2021 09:30  [name] LT BREAST BX W LOC DEV 1ST LESION IMG BX SPEC US GUIDE  Addendum Date: 04/13/2021   ADDENDUM  REPORT: 04/12/2021 14:26 ADDENDUM: Pathology revealed GRADE II INVASIVE DUCTAL CARCINOMA, DUCTAL CARCINOMA IN SITU of the Left breast, 3:00 o'clock, (ribbon clip). This was found to be concordant by Dr. Dorise Bullion. Pathology results were discussed with the patient by telephone. The patient reported doing well after the biopsy with tenderness at the site. Post biopsy instructions and care were reviewed and questions were answered. The patient was encouraged to call [facility] for any additional concerns. My direct phone number was provided. Surgical consultation has been arranged with Dr. Stark Klein, per patient request, at [facility] on April 18, 2021. Pathology results reported by Terie Purser, RN on 04/12/2021. Electronically Signed   By: Dorise Bullion  III M.D   On: 04/12/2021 14:26   Result Date: 04/13/2021 CLINICAL DATA:  Biopsy of a left breast mass at 3 o'clock, 6 cm from the nipple. EXAM: ULTRASOUND GUIDED LEFT BREAST CORE NEEDLE BIOPSY COMPARISON:  Previous exam(s). PROCEDURE: I met with the patient and we discussed the procedure of ultrasound-guided biopsy, including benefits and alternatives. We discussed the high likelihood of a successful procedure. We discussed the risks of the procedure, including infection, bleeding, tissue injury, clip migration, and inadequate sampling. Informed written consent was given. The usual time-out protocol was performed immediately prior to the procedure. Lesion quadrant: 3 o'clock, 6 cm from the nipple, left breast Using sterile technique and 1% Lidocaine as local anesthetic, under direct ultrasound visualization, a 12 gauge spring-loaded device was used to perform biopsy of a mass in the left breast at 3 o'clock, 6 cm from the nipple using a lateral approach. At the conclusion of the procedure a ribbon shaped tissue marker clip was deployed into the biopsy cavity. Follow up 2 view mammogram was performed and dictated  separately. IMPRESSION: Ultrasound guided biopsy of the left breast mass at 3 o'clock. No apparent complications. Electronically Signed: By: Dorise Bullion III M.D On: 04/11/2021 09:18    Orders Placed This Encounter  Procedures   CMP ([place] only)    Standing Status:   Standing    Number of Occurrences:   5    Standing Expiration Date:   04/28/2022     All questions were answered. The patient knows to call the clinic with any problems, questions or concerns. The total time spent in the appointment was 55 minutes.     Truitt Merle, MD 04/28/2021   I, Wilburn Mylar, am acting as scribe for Truitt Merle, MD.   I have reviewed the above documentation for accuracy and completeness, and I agree with the above.

## 2021-04-29 ENCOUNTER — Encounter: Payer: Self-pay | Admitting: Genetic Counselor

## 2021-04-29 ENCOUNTER — Encounter: Payer: Self-pay | Admitting: *Deleted

## 2021-04-29 ENCOUNTER — Telehealth: Payer: Self-pay | Admitting: Hematology

## 2021-04-29 DIAGNOSIS — Z860100 Personal history of colon polyps, unspecified: Secondary | ICD-10-CM | POA: Insufficient documentation

## 2021-04-29 DIAGNOSIS — Z800 Family history of malignant neoplasm of digestive organs: Secondary | ICD-10-CM

## 2021-04-29 HISTORY — DX: Family history of malignant neoplasm of digestive organs: Z80.0

## 2021-04-29 HISTORY — DX: Personal history of colonic polyps: Z86.010

## 2021-04-29 HISTORY — DX: Personal history of colon polyps, unspecified: Z86.0100

## 2021-04-29 NOTE — Progress Notes (Signed)
REFERRING PROVIDER: Truitt Merle, MD Anita Duke,  Anita Duke [id number]  PRIMARY PROVIDER:  Binnie Rail, MD  PRIMARY REASON FOR VISIT:  1. 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 Cecal cancer, s/p lap r colectomy   3. Personal history of colonic polyps   4. Family history of colon cancer     HISTORY OF PRESENT ILLNESS:   Anita Duke, a 80 y.o. female, was seen for a [place] cancer genetics consultation at the request of Dr. Burr Medico due to a personal and family history of cancer.  Anita Duke presents to clinic today to discuss the possibility of a hereditary predisposition to cancer, to discuss genetic testing, and to further clarify her future cancer risks, as well as potential cancer risks for family members.   In August 2022, at the age of 64, Anita Duke was diagnosed with invasive ductal carcinoma of the left breast. The treatment plan includes lumpectomy and anti-estrogens.  At the age of 54, she was diagnosed with adenocarcinoma of the cecum (microsatellite stable).  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 At the age of 8, she was was diagnosed with basal cell carcinoma.   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 No axillary adenopathy.     04/11/2021 Cancer Staging   Staging form: Breast, AJCC 8th Edition - Clinical stage from 04/11/2021: Stage IA (cT1c, cN0, cM0, G2, ER+, PR+, HER2-) - Signed by Truitt Merle, MD on 04/27/2021 Stage prefix: Initial diagnosis Histologic grading system: 3 grade system   04/11/2021 Pathology Results   Diagnosis Breast, left, needle core biopsy, 3:00 - INVASIVE DUCTAL CARCINOMA, SEE COMMENT. - DUCTAL CARCINOMA IN SITU. Microscopic Comment The  carcinoma appears grade 2.  PROGNOSTIC INDICATORS Results: IMMUNOHISTOCHEMICAL AND MORPHOMETRIC ANALYSIS PERFORMED MANUALLY The tumor cells are NEGATIVE for Her2 (1+). 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  Nulliparous.  OCP use for approximately 0 years.  Ovaries intact: no per pt; removed around age 63 Hysterectomy: yes; removed around age 1 Menopausal status: postmenopausal.  HRT use: 10 years. Colonoscopy: yes;  most recent in 2021; see hx noted above .   Past Medical History:  Diagnosis Date   Acquired complete AV block    AV node ablation May 28, 2012    Allergy    mild- uses claritin    Anemia    Hemoglobin 10.4, December, 2013   Arthritis    "not bad; little in my hands; some in my knees" (11/04/2013)   Asthma    B12 deficiency 03/16/2020   Bilateral sensorineural hearing loss 03/29/2017   Breast cancer ([place])    CAD (coronary artery disease)    LIMA to the LAD at time of mitral valve repair / LIMA atretic,, February, 2011   Cataracts, bilateral    removed bilat    Cecal cancer 2014   colon   CHF (congestive heart failure)    Chronic systolic dysfunction of left ventricle 05/21/2013   Clotting disorder  21 yrs ago blood clot in heart    Colon cancer (HCC)    Colon polyp, hyperplastic    Diabetes mellitus type II, controlled 03/02/2009   Diabetic neuropathy 02/21/2018   [facility]    Diverticulosis of colon    Diverticulosis of large intestine 08/04/2002   Ejection fraction < 50%    EF 30%, New diagnosis   february, 2011, etiology not clear, consider rate related tachycardia, and not use carvedilol, catheterization no constriction, /    EF 55-60% echo, June, 2011 /     Beta blocker  stopped May, 2012 with reactive airway disease.  Patient does not tolerate metoprolol.  Carvedilol stopped  /   EF 55%, echo, February, 2012  //   EF 40-45%, septal dyssynergy, hypokinesis of the a   Essential hypertension 07/07/2008   Family history of colon cancer 04/29/2021   GERD (gastroesophageal reflux disease)    Hemorrhoids, internal    High cholesterol    History of CABG    Hyperlipidemia 07/07/2008   Lumbar radiculopathy, acute 06/17/2018   Major depressive disorder 07/19/2018   Mitral regurgitation    a. s/p repair with LAA ligation and MAZE at time of surgery   Mitral stenosis    Mild, February, 2011, post mitral valve repair / mild, echo, June, 2011  //  Mild functional mitral stenosis, echo, February, 2012  //   mild stenosis, echo, December, 2013  //  mild, echo, August, 2014   //   Mild, echo, April, 2016    Osteopenia    left hip    Pacemaker-Medtronic 05/20/2012   Pacemaker placed September, 2013   //   upgraded to biventricular CRT pacemaker November 04, 2013    Permanent atrial fibrillation    a. s/p MAZE b. s/p AVN ablation   Personal history of colonic polyps 04/29/2021   PONV (postoperative nausea and vomiting)    Pulmonary HTN    QT prolongation    Tikosyn and Effexor. QT prolonged October 13, 2011, peak is in dose reduced from 500  to -250 twice a day   Right ventricular dysfunction    Mild to moderate, echo, February, 2011 / normalized echo, June, 2011 /  RV normal, echo, February, 2012  //   right ventricle reported as good echo, December, 2013    S/P mitral valve repair    [facility] / Maze procedure/ atrial appendage removed were tied off    Sick sinus syndrome    s/p Medtronic dual chamber PPM 05/17/12    Sleep difficulties 09/13/2019   Vitamin D deficiency 02/20/2014    Past Surgical History:  Procedure Laterality Date   ABDOMINAL HYSTERECTOMY  ?2002   AV NODE ABLATION N/A 05/28/2012   Procedure: AV NODE ABLATION;  Surgeon: Thompson Grayer, MD;  Location:  [facility] CATH LAB;  Service: Cardiovascular;  Laterality: N/A;   BI-VENTRICULAR PACEMAKER UPGRADE N/A 11/04/2013   upgrade of previously implanted dual chamber pacemaker to [place] by Dr Rayann Heman   BREAST EXCISIONAL BIOPSY Left over 10 years ago   benign   BREAST SURGERY     CARDIOVERSION  02/08/2012   Procedure: CARDIOVERSION;  Surgeon: Carlena Bjornstad, MD;  Location: [place];  Service: Cardiovascular;  Laterality: N/A;   CARDIOVERSION  02/29/2012   Procedure: CARDIOVERSION;  Surgeon: Carlena Bjornstad, MD;  Location: [place];  Service: Cardiovascular;  Laterality: N/A;   CARDIOVERSION  05/15/2012   Procedure: CARDIOVERSION;  Surgeon: Leonie Douglas  Ron Parker, MD;  Location: [facility] ENDOSCOPY;  Service: Cardiovascular;  Laterality: N/A;   CATARACT EXTRACTION, BILATERAL  2017   [place] SURGERY  2014   right hemicolectomy    COLONOSCOPY     CORONARY ARTERY BYPASS GRAFT  2001   CABG X1 "at time of mitral valve repair" (12/04/2012   DIAGNOSTIC LAPAROSCOPIC LIVER BIOPSY Left 12/03/2012   Procedure: DIAGNOSTIC LAPAROSCOPIC LIVER BIOPSY;  Surgeon: Stark Klein, MD;  Location: [facility] [place];  Service: General;  Laterality: Left;   EYE SURGERY     bilateral cataract surgery   LAPAROSCOPIC RIGHT HEMI COLECTOMY  12/03/2012   Procedure: LAPAROSCOPIC RIGHT HEMI COLECTOMY;  Surgeon: Stark Klein, MD;  Location: [facility];  Service: General;;   LUMBAR LAMINECTOMY/ DECOMPRESSION WITH MET-RX Left 06/17/2018   Procedure: left Lumbar three-fourextraforaminal Microdiscectomy with Met-Rx;  Surgeon: Kristeen Miss, MD;  Location: [place];  Service: Neurosurgery;  Laterality: Left;   MAZE  2001   w/ MVR & CABG   MITRAL VALVE REPAIR  2001   "anterior and posterior leaflets" (12/04/2012)   PERMANENT PACEMAKER INSERTION N/A 05/17/2012   MDT Adapta L implanted by Dr Rayann Heman for tachy/brady syndrome   POLYPECTOMY     TONSILLECTOMY AND ADENOIDECTOMY  ~ 6301   UMBILICAL HERNIA REPAIR N/A 12/03/2012   Procedure: HERNIA REPAIR UMBILICAL  ADULT;  Surgeon: Stark Klein, MD;  Location: [facility];  Service: General;  Laterality: N/A;    Social History   Socioeconomic History   Marital status: Widowed    Spouse name: Not on file   Number of children: 0   Years of education: 12   Highest education level: High school graduate  Occupational History   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: [employer]   Tobacco Use   Smoking status: Never   Smokeless tobacco: Never  Vaping Use   Vaping Use: Never used  Substance and Sexual Activity   Alcohol use: No   Drug use: No   Sexual activity: Not Currently    Birth control/protection: Surgical  Other Topics Concern   Not on file  Social History Narrative   No caffeine   Right handed    Lives alone    Social Determinants of Health   Financial Resource Strain: Not on file  Food Insecurity: Not on file  Transportation Needs: Not on file  Physical Activity: Not on file  Stress: Not on file  Social Connections: Not on file     FAMILY HISTORY:  We obtained a detailed, 4-generation family history.  Significant diagnoses are listed below: Family History  Problem Relation Age of Onset   Colon cancer Maternal Aunt        dx 77s; mother's paternal half sister   Colon cancer Maternal Aunt        dx late 90s   Cancer Maternal Grandfather        unknown type; dx unknown age   Diabetes Paternal Grandmother    Diabetes Paternal Grandfather    Brain cancer Nephew 34    Ms. Ollis is unaware of previous family history of genetic testing for hereditary cancer risks. There is no reported Ashkenazi Jewish ancestry. There is no known consanguinity.  GENETIC COUNSELING ASSESSMENT: Ms. Colegrove is a 80 y.o. female with a personal and family history of cancer which is somewhat suggestive of a hereditary cancer syndrome and predisposition to cancer given the presence  of related cancers in her family. We, therefore,  discussed and recommended the following at today's visit.   DISCUSSION: We discussed that 5 - 10% of colon is hereditary, with most cases of hereditary colon cancer associated with mutations in the Lynch syndrome genes.  There are also genes associated with hereditary breast cancer, including but not limited to BRCA1/2 and hereditary polyposis syndromes, including but not limited to APC and MUTYH.  We discussed that testing is beneficial for several reasons including knowing how to follow individuals after completing their treatment and understanding if other family members could be at risk for cancer and allowing them to undergo genetic testing.   We reviewed the characteristics, features and inheritance patterns of hereditary cancer syndromes. We also discussed genetic testing, including the appropriate family members to test, the process of testing, insurance coverage and turn-around-time for results. We discussed the implications of a negative, positive, carrier and/or variant of uncertain significant result. We recommended Anita Duke pursue genetic testing for a panel that includes genes associated with breast and colon cancer and colon polyps.   Anita Duke  was offered a common hereditary cancer panel (47 genes) and an expanded pan-cancer panel (77 genes). Anita Duke was informed of the benefits and limitations of each panel, including that expanded pan-cancer panels contain genes that do not have clear management guidelines at this point in time.  We also discussed that as the number of genes included on a panel increases, the chances of variants of uncertain significance increases.  After considering the benefits and limitations of each gene panel, Anita Duke  elected to have expanded pan-cancer panel through [place].  The CancerNext-Expanded gene panel offered by [facility] and includes sequencing, rearrangement, and RNA analysis for the following 77 genes: AIP, ALK, APC, ATM, AXIN2, BAP1,  BARD1, BLM, BMPR1A, BRCA1, BRCA2, BRIP1, CDC73, CDH1, CDK4, CDKN1B, CDKN2A, CHEK2, CTNNA1, DICER1, FANCC, FH, FLCN, GALNT12, KIF1B, LZTR1, MAX, MEN1, MET, MLH1, MSH2, MSH3, MSH6, MUTYH, NBN, NF1, NF2, NTHL1, PALB2, PHOX2B, PMS2, POT1, PRKAR1A, PTCH1, PTEN, RAD51C, RAD51D, RB1, RECQL, RET, SDHA, SDHAF2, SDHB, SDHC, SDHD, SMAD4, SMARCA4, SMARCB1, SMARCE1, STK11, SUFU, TMEM127, TP53, TSC1, TSC2, VHL and XRCC2 (sequencing and deletion/duplication); EGFR, EGLN1, HOXB13, KIT, MITF, PDGFRA, POLD1, and POLE (sequencing only); EPCAM and GREM1 (deletion/duplication only).   Based on Ms. Gohman's personal and family history of Lynch-related cancers and her personal history of more than 10 tubular adenomas, she meets medical criteria for genetic testing. Despite that she meets criteria, she may still have an out of pocket cost. We discussed that if her out of pocket cost for testing is over $100, the laboratory will contact her to discuss self-pay prices and patient pay assistance programs.   PLAN: After considering the risks, benefits, and limitations, Ms. Heggs provided informed consent to pursue genetic testing and the blood sample was sent to [facility] for analysis of the CancerNext-Expanded +RNAinsight Panel. Results should be available within approximately 3 weeks' time, at which point they will be disclosed by telephone to Ms. Angelica, as will any additional recommendations warranted by these results. Ms. Gasner will receive a summary of her genetic counseling visit and a copy of her results once available. This information will also be available in Epic.   Lastly, we encouraged Ms. Walczyk to remain in contact with cancer genetics annually so that we can continuously update the family history and inform her of any changes in cancer genetics and testing that may be of benefit for this family.  Ms. Poppen questions were answered to her satisfaction today. Our contact information was provided  should additional questions or concerns arise. Thank you for the referral and allowing [name] to share in the care of your patient.   Jamieson Lisa M. Joette Catching, [place], [facility] Genetic Counselor [email or web address] (P) [phone]  The patient was seen for a total of 35 minutes in face-to-face genetic counseling. The patient was seen alone.  Drs. Magrinat, Lindi Adie and/or Burr Medico were available to discuss this case as needed.    _______________________________________________________________________ For Office Staff:  Number of people involved in session: 1 Was an Intern/ student involved with case: no

## 2021-04-29 NOTE — Telephone Encounter (Signed)
Scheduled follow-up appointment per 8/25 los. Patient is aware. Mailed calendar.

## 2021-04-30 ENCOUNTER — Other Ambulatory Visit (HOSPITAL_COMMUNITY): Payer: Self-pay | Admitting: Internal Medicine

## 2021-05-02 ENCOUNTER — Other Ambulatory Visit (HOSPITAL_COMMUNITY): Payer: Self-pay

## 2021-05-02 MED ORDER — BUPROPION HCL ER (XL) 150 MG PO TB24
150.0000 mg | ORAL_TABLET | Freq: Every day | ORAL | 0 refills | Status: DC
  Filled 2021-05-02: qty 14, 14d supply, fill #0

## 2021-05-03 ENCOUNTER — Other Ambulatory Visit (HOSPITAL_COMMUNITY): Payer: Self-pay

## 2021-05-03 MED ORDER — ENOXAPARIN SODIUM 60 MG/0.6ML IJ SOSY
PREFILLED_SYRINGE | INTRAMUSCULAR | 0 refills | Status: DC
  Filled 2021-05-03: qty 6, 5d supply, fill #0

## 2021-05-04 ENCOUNTER — Other Ambulatory Visit (HOSPITAL_COMMUNITY): Payer: Self-pay

## 2021-05-12 ENCOUNTER — Other Ambulatory Visit (HOSPITAL_COMMUNITY): Payer: Self-pay

## 2021-05-17 ENCOUNTER — Ambulatory Visit: Payer: Medicare Other

## 2021-05-17 ENCOUNTER — Other Ambulatory Visit: Payer: Self-pay

## 2021-05-17 ENCOUNTER — Other Ambulatory Visit (HOSPITAL_COMMUNITY): Payer: Self-pay | Admitting: Internal Medicine

## 2021-05-17 DIAGNOSIS — Z7901 Long term (current) use of anticoagulants: Secondary | ICD-10-CM

## 2021-05-17 LAB — POCT INR: INR: 3 (ref 2.0–3.0)

## 2021-05-17 NOTE — Patient Instructions (Addendum)
Pre visit review using our clinic review tool, if applicable. No additional management support is needed unless otherwise documented below in the visit note.  Please follow directions below, call if any questions.  9/17: Last dose of coumadin before surgery, 2.'5mg'$  9/18: No coumadin, no lovenox 9/19: No coumadin, Lovenox AM 9/20: No coumadin, Lovenox AM 9/21: No coumadin, Lovenox AM (before 7am)   9/22: Surgery Day: NO COUMADIN AND NO LOVENOX   9/23: Take '5mg'$  (2 tablets) coumadin, Lovenox AM  9/24: Take '5mg'$  (2 tablets) coumadin, Lovenox AM  9/25: Take '5mg'$  (2 tablets) coumadin, Lovenox AM  9/26: Take 3.'75mg'$  (1 & 1/2 tablets) coumadin, Lovenox AM  9/27: Resume normal dosing of coumadin, No lovenox 9/28: Recheck INR at 2 pm at [place] location, [place]

## 2021-05-18 ENCOUNTER — Encounter: Payer: Self-pay | Admitting: Internal Medicine

## 2021-05-18 ENCOUNTER — Ambulatory Visit: Payer: Medicare Other | Admitting: Physician Assistant

## 2021-05-18 NOTE — Progress Notes (Signed)
Sent message to [facility] requesting device orders. Also called rep,Brian Small, to notify him of patient's surgery. Left message. Aaron Edelman to call back to confirm receipt of message.

## 2021-05-18 NOTE — Progress Notes (Addendum)
Surgical Instructions    Your procedure is scheduled on Thursday September 22,2022.  Report to [facility] Main Entrance "A" at 8:30 A.M., then check in with the Admitting office.  Call this number if you have problems the morning of surgery:  [phone]   If you have any questions prior to your surgery date call [phone]: Open Monday-Friday 8am-4pm    Remember:  Do not eat after midnight the night before your surgery  You may drink clear liquids until 7:30 am the morning of your surgery.   Clear liquids allowed are: Water, Non-Citrus Juices (without pulp), Carbonated Beverages, Clear Tea, Black Coffee ONLY (NO MILK, CREAM OR POWDERED CREAMER of any kind), and Gatorade    Take these medicines the morning of surgery with A SIP OF WATER:  atorvastatin (LIPITOR) buPROPion (WELLBUTRIN XL)  carvedilol (COREG) loratadine (CLARITIN) Polyethyl Glycol-Propyl Glycol (SYSTANE) if needed sodium chloride (OCEAN) 0.65 % SOLN nasal spray if needed  As of today, STOP taking any Aspirin (unless otherwise instructed by your surgeon) Aleve, Naproxen, Ibuprofen, Motrin, Advil, Goody's, BC's, all herbal medications, fish oil, and all vitamins.  Follow your surgeon's instructions for warfarin (COUMADIN) and enoxaparin (LOVENOX) . Please follow directions below, call if any questions.  9/17: Last dose of coumadin before surgery, 2.'5mg'$   9/18: No coumadin, no lovenox  9/19: No coumadin, Lovenox AM  9/20: No coumadin, Lovenox AM  9/21: No coumadin, Lovenox AM (before 7am)     9/22: Surgery Day: NO COUMADIN AND NO LOVENOX     WHAT DO I DO ABOUT MY DIABETES MEDICATION?  Do not take oral diabetes medicines (pills)-JARDIANCE  the morning of surgery.  DO NOT take JARDIANCE the day before surgery      HOW TO MANAGE YOUR DIABETES BEFORE AND AFTER SURGERY  Why is it important to control my blood sugar before and after surgery? Improving blood sugar levels before and after surgery helps healing  and can limit problems. A way of improving blood sugar control is eating a healthy diet by:  Eating less sugar and carbohydrates  Increasing activity/exercise  Talking with your doctor about reaching your blood sugar goals High blood sugars (greater than 180 mg/dL) can raise your risk of infections and slow your recovery, so you will need to focus on controlling your diabetes during the weeks before surgery. Make sure that the doctor who takes care of your diabetes knows about your planned surgery including the date and location.  How do I manage my blood sugar before surgery? Check your blood sugar at least 4 times a day, starting 2 days before surgery, to make sure that the level is not too high or low.  Check your blood sugar the morning of your surgery when you wake up and every 2 hours until you get to the Short Stay unit.  If your blood sugar is less than 70 mg/dL, you will need to treat for low blood sugar: Do not take insulin. Treat a low blood sugar (less than 70 mg/dL) with  cup of clear juice (cranberry or apple), 4 glucose tablets, OR glucose gel. Recheck blood sugar in 15 minutes after treatment (to make sure it is greater than 70 mg/dL). If your blood sugar is not greater than 70 mg/dL on recheck, call [phone] for further instructions. Report your blood sugar to the short stay nurse when you get to Short Stay.  If you are admitted to the hospital after surgery: Your blood sugar will be checked by the staff and  you will probably be given insulin after surgery (instead of oral diabetes medicines) to make sure you have good blood sugar levels. The goal for blood sugar control after surgery is 80-180 mg/dL.         Do not wear jewelry or makeup Do not wear lotions, powders, perfumes/colognes, or deodorant. Do not shave 48 hours prior to surgery.  Men may shave face and neck. Do not bring valuables to the hospital. DO Not wear nail polish, gel polish, artificial nails, or any  other type of covering on natural nails including finger and toenails. If patients have artificial nails, gel coating, etc. that need to be removed by a nail salon please have this removed prior to surgery or surgery may need to be canceled/delayed if the surgeon/ anesthesia feels like the patient is unable to be adequately monitored.             [facility] is not responsible for any belongings or valuables.  Do NOT Smoke (Tobacco/Vaping)  24 hours prior to your procedure If you use a CPAP at night, you may bring your mask for your overnight stay.   Contacts, glasses, dentures or bridgework may not be worn into surgery, please bring cases for these belongings   For patients admitted to the hospital, discharge time will be determined by your treatment team.   Patients discharged the day of surgery will not be allowed to drive home, and someone needs to stay with them for 24 hours.  NO VISITORS WILL BE ALLOWED IN PRE-OP WHERE PATIENTS GET READY FOR SURGERY.  ONLY 1 SUPPORT PERSON MAY BE PRESENT WHILE YOU ARE IN SURGERY.  IF YOU ARE TO BE ADMITTED, ONCE YOU ARE IN YOUR ROOM YOU WILL BE ALLOWED TWO (2) VISITORS.  Minor children may have two parents present. Special consideration for safety and communication needs will be reviewed on a case by case basis.  Special instructions:    Oral Hygiene is also important to reduce your risk of infection.  Remember - BRUSH YOUR TEETH THE MORNING OF SURGERY WITH YOUR REGULAR TOOTHPASTE   [facility]- Preparing For Surgery  Before surgery, you can play an important role. Because skin is not sterile, your skin needs to be as free of germs as possible. You can reduce the number of germs on your skin by washing with CHG (chlorahexidine gluconate) Soap before surgery.  CHG is an antiseptic cleaner which kills germs and bonds with the skin to continue killing germs even after washing.     Please do not use if you have an allergy to CHG or antibacterial soaps. If  your skin becomes reddened/irritated stop using the CHG.  Do not shave (including legs and underarms) for at least 48 hours prior to first CHG shower. It is OK to shave your face.  Please follow these instructions carefully.     Shower the NIGHT BEFORE SURGERY and the MORNING OF SURGERY with CHG Soap.   If you chose to wash your hair, wash your hair first as usual with your normal shampoo. After you shampoo, rinse your hair and body thoroughly to remove the shampoo.  Then [employer] and genitals (private parts) with your normal soap and rinse thoroughly to remove soap.  After that Use CHG Soap as you would any other liquid soap. You can apply CHG directly to the skin and wash gently with a scrungie or a clean washcloth.   Apply the CHG Soap to your body ONLY FROM THE NECK  DOWN.  Do not use on open wounds or open sores. Avoid contact with your eyes, ears, mouth and genitals (private parts). Wash Face and genitals (private parts)  with your normal soap.   Wash thoroughly, paying special attention to the area where your surgery will be performed.  Thoroughly rinse your body with warm water from the neck down.  DO NOT shower/wash with your normal soap after using and rinsing off the CHG Soap.  Pat yourself dry with a CLEAN TOWEL.  Wear CLEAN PAJAMAS to bed the night before surgery  Place CLEAN SHEETS on your bed the night before your surgery  DO NOT SLEEP WITH PETS.   Day of Surgery:  Take a shower with CHG soap. Wear Clean/Comfortable clothing the morning of surgery Do not apply any deodorants/lotions.   Remember to brush your teeth WITH YOUR REGULAR TOOTHPASTE.   Please read over the following fact sheets that you were given.

## 2021-05-18 NOTE — Progress Notes (Signed)
Anita Duke returned call for Anita Duke to confirm pt's surgery on 05/26/21 at 1030. Informed her that we are awaiting orders from device clinic.

## 2021-05-18 NOTE — Progress Notes (Signed)
Agree with management.  Amelita Risinger J Errol Ala, MD  

## 2021-05-18 NOTE — Progress Notes (Signed)
[facility] DEVICE PROGRAMMING  Patient Information: Name:  Anita Duke  DOB:  08-02-1941  MRN:  [id number]    Planned Procedure:  left breast lumpectomy  Surgeon:  Stark Klein  Date of Procedure:  05/26/21  Cautery will be used.  Position during surgery:  supine   Please send documentation back to:  [facility] (Fax # [phone])  Device Information:  Clinic EP Physician:  Thompson Grayer, MD   Device Type:  Pacemaker Manufacturer and Phone #:  [employer]: [phone] Pacemaker Dependent?:  Unknown Date of Last Device Check:  03/29/21 (Remote) Normal Device Function?:  Yes.    Electrophysiologist's Recommendations:  Have magnet available. Provide continuous ECG monitoring when magnet is used or reprogramming is to be performed.  Procedure will likely interfere with device function.  Device should be programmed:  Asynchronous pacing during procedure and returned to normal programming after procedure  Per Device Clinic Standing Orders, Simone Curia, RN  11:02 AM 05/18/2021

## 2021-05-19 ENCOUNTER — Other Ambulatory Visit: Payer: Self-pay

## 2021-05-19 ENCOUNTER — Encounter (HOSPITAL_COMMUNITY): Payer: Self-pay

## 2021-05-19 ENCOUNTER — Encounter (HOSPITAL_COMMUNITY)
Admission: RE | Admit: 2021-05-19 | Discharge: 2021-05-19 | Disposition: A | Discharge status: Home / Self Care | Payer: Medicare Other | Source: Ambulatory Visit | Attending: General Surgery | Admitting: General Surgery

## 2021-05-19 DIAGNOSIS — J449 Chronic obstructive pulmonary disease, unspecified: Secondary | ICD-10-CM | POA: Diagnosis not present

## 2021-05-19 DIAGNOSIS — D0512 Intraductal carcinoma in situ of left breast: Secondary | ICD-10-CM | POA: Diagnosis not present

## 2021-05-19 DIAGNOSIS — I5022 Chronic systolic (congestive) heart failure: Secondary | ICD-10-CM | POA: Diagnosis not present

## 2021-05-19 DIAGNOSIS — I2510 Atherosclerotic heart disease of native coronary artery without angina pectoris: Secondary | ICD-10-CM | POA: Diagnosis not present

## 2021-05-19 DIAGNOSIS — Z950 Presence of cardiac pacemaker: Secondary | ICD-10-CM | POA: Diagnosis not present

## 2021-05-19 DIAGNOSIS — Z79899 Other long term (current) drug therapy: Secondary | ICD-10-CM | POA: Diagnosis not present

## 2021-05-19 DIAGNOSIS — I110 Hypertensive heart disease with heart failure: Secondary | ICD-10-CM | POA: Insufficient documentation

## 2021-05-19 DIAGNOSIS — I4891 Unspecified atrial fibrillation: Secondary | ICD-10-CM | POA: Diagnosis not present

## 2021-05-19 DIAGNOSIS — Z951 Presence of aortocoronary bypass graft: Secondary | ICD-10-CM | POA: Diagnosis not present

## 2021-05-19 DIAGNOSIS — Z01812 Encounter for preprocedural laboratory examination: Secondary | ICD-10-CM | POA: Diagnosis not present

## 2021-05-19 DIAGNOSIS — Z7901 Long term (current) use of anticoagulants: Secondary | ICD-10-CM | POA: Insufficient documentation

## 2021-05-19 LAB — CBC
HCT: 44.5 % (ref 36.0–46.0)
Hemoglobin: 14.1 g/dL (ref 12.0–15.0)
MCH: 30.4 pg (ref 26.0–34.0)
MCHC: 31.7 g/dL (ref 30.0–36.0)
MCV: 95.9 fL (ref 80.0–100.0)
Platelets: 193 10*3/uL (ref 150–400)
RBC: 4.64 MIL/uL (ref 3.87–5.11)
RDW: 13.2 % (ref 11.5–15.5)
WBC: 6.4 10*3/uL (ref 4.0–10.5)
nRBC: 0 % (ref 0.0–0.2)

## 2021-05-19 LAB — GLUCOSE, CAPILLARY: Glucose-Capillary: 135 mg/dL — ABNORMAL HIGH (ref 70–99)

## 2021-05-19 NOTE — Progress Notes (Signed)
Mickel Baas with [employer] notified on patient's upcoming surgery on 05/26/21 at 10:30 am  and device programming recommendations. Mickel Baas verbalized understanding and stated a representative will arrive on the day of surgery at 10 am.

## 2021-05-19 NOTE — Progress Notes (Signed)
PCP - Dr. Billey Gosling Cardiologist - Dr. Loralie Champagne  PPM/ICD - [employer] Pacemaker Device Orders - Have magnet available. Provide continuous ECG monitoring when magnet is used or reprogramming is to be performed.  Procedure will likely interfere with device function.  Device should be programmed:  Asynchronous pacing during procedure and returned to normal programming after procedure Rep Notified - Mickel Baas with [employer]. Notified 05/19/21 at 0923.  Chest x-ray - n/a EKG - 09/22/20 Stress Test - 2014 ECHO - 09/22/20 Cardiac Cath -   Sleep Study - denies CPAP - n/a  Fasting Blood Sugar - 135 Checks Blood Sugar- Patient states she rarely checks her blood sugar. Usually only when she goes to the doctor.   Blood Thinner Instructions: Per [facility] 9/17: Last dose of coumadin before surger, 2.'5mg'$  9/18: No coumadin, no lovenox 9/19: No coumadin, Lovenox AM 9/20: No coumadin, Lovenox AM 9/21: No coumadin, Lovenox AM (before 7am)  9/22: Surgery Day: NO COUMADIN AND NO LOVENOX Aspirin Instructions: n/a  ERAS Protcol - Clear liquids until 0730 am.  PRE-SURGERY Ensure or G2- n/a  COVID TEST- Ambulatory Surgery   Anesthesia review: Yes  Patient denies shortness of breath, fever, cough and chest pain at PAT appointment   All instructions explained to the patient, with a verbal understanding of the material. Patient agrees to go over the instructions while at home for a better understanding. Patient also instructed to self quarantine after being tested for COVID-19. The opportunity to ask questions was provided.

## 2021-05-20 ENCOUNTER — Telehealth: Payer: Self-pay | Admitting: Genetic Counselor

## 2021-05-20 ENCOUNTER — Encounter: Payer: Self-pay | Admitting: Genetic Counselor

## 2021-05-20 DIAGNOSIS — Z1379 Encounter for other screening for genetic and chromosomal anomalies: Secondary | ICD-10-CM | POA: Insufficient documentation

## 2021-05-20 NOTE — Telephone Encounter (Signed)
Revealed results of genetic testing.  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 No other pathogenic variants detected in [facility]Expanded +RNAinsight Panel.  Patient was not at home at time of call and was not able to talk in detail about the results.  Patient said she will call back next week to discuss results further.

## 2021-05-20 NOTE — Anesthesia Preprocedure Evaluation (Addendum)
Anesthesia Evaluation  Patient identified by MRN, date of birth, ID band Patient awake  General Assessment Comment:Had severe PONV after last anesthetic for back surgery  Reviewed: Allergy & Precautions, NPO status , Patient's Chart, lab work & pertinent test results, reviewed documented beta blocker date and time   History of Anesthesia Complications (+) PONV, Family history of anesthesia reaction and history of anesthetic complications  Airway Mallampati: II  TM Distance: >3 FB Neck ROM: Full    Dental  (+) Caps, Dental Advisory Given, Implants   Pulmonary asthma ,    Pulmonary exam normal breath sounds clear to auscultation       Cardiovascular hypertension, Pt. on medications and Pt. 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  Pulmonary HTN   Neuro/Psych PSYCHIATRIC DISORDERS Depression  Neuromuscular disease    GI/Hepatic Neg liver ROS, GERD  Medicated and Controlled,  Endo/Other  diabetes, Well Controlled, Type 2, Oral Hypoglycemic AgentsLeft Breast Ca  Hyperlipidemia  Renal/GU negative Renal ROS  negative genitourinary   Musculoskeletal  (+) Arthritis , Osteoarthritis,    Abdominal   Peds  Hematology  (+) anemia , Coumadin therapy stopped 4 days ago, on lovenox bridge last dose of lovenox yesterday am   Anesthesia Other Findings   Reproductive/Obstetrics                           Anesthesia Physical Anesthesia Plan  ASA: 3  Anesthesia Plan: General   Post-op Pain Management:    Induction: Intravenous  PONV Risk Score and Plan: 4 or greater and Treatment may vary due to age or medical condition, Ondansetron, TIVA, Propofol infusion and Aprepitant  Airway Management Planned: LMA  Additional Equipment:   Intra-op  Plan:   Post-operative Plan:   Informed Consent: I have reviewed the patients History and Physical, chart, labs and discussed the procedure including the risks, benefits and alternatives for the proposed anesthesia with the patient or authorized representative who has indicated his/her understanding and acceptance.     Dental advisory given  Plan Discussed with: CRNA and Anesthesiologist  Anesthesia Plan Comments: (PAT note written 05/20/2021 by Myra Gianotti, PA-C. )      Anesthesia Quick Evaluation

## 2021-05-20 NOTE — Progress Notes (Signed)
Anesthesia Chart Review:  Case: [id number] Date/Time: 05/26/21 1015   Procedure: LEFT BREAST LUMPECTOMY (Left: Breast)   Anesthesia type: General   Pre-op diagnosis: LEFT BREAST CANCER   Location: [place] OR ROOM 02 / [place] OR   Surgeons: Stark Klein, MD       DISCUSSION: Patient is an 80 year old female scheduled for the above procedure.  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 Jude [employer] Allure Quardra RF BiV PPM 11/04/13), HTN, DM2, anemia, COPD, pulmonary HTN (no PAH on 2014 [place]; RVSP 30.7 mmHG 09/22/20 echo), asthma, spinal surgery (L3-4 microdiscectomy 06/17/18).   Last HF cardiology visit with Dr. Aundra Dubin on 03/22/21. No chest pain. Volume status okay. St. Jude device interrogated, stable thoracic impedance, 97% BiV pacing. Six month follow-up with echo planned. [facility]CS reached out to cardiology regarding surgery plans and anticoagulation instructions with input given, but deferred to PCP who manages. (Lovenox bridging instruction while warfarin on hold is outlined in 04/21/21 note by Randall An, RN.) She had ICM Monitoring on 04/25/21. [place] Thoracic impedance was normal, and patient was asymptomatic for fluid symptoms.   Preoperative PPM Rx: Device Information: Clinic EP Physician:  Thompson Grayer, MD    Device Type:  Pacemaker Manufacturer and Phone #:  St. Jude[employer]ott: [phone] Pacemaker Dependent?:   Unknown Date of Last Device Check:  03/29/21 (Remote)       Normal Device Function?:  Yes.     Electrophysiologist's Recommendations: Have magnet available. Provide continuous ECG monitoring when magnet is used or reprogramming is to be performed.  Procedure will likely interfere with device function.  Device should be programmed:  Asynchronous pacing during procedure and returned to normal programming after procedure - Rep Notified - Mickel Baas with [employer]. Notified 05/19/21 at 0923 by PAT RN.  She is for PT/PTT on arrival. Anesthesia team to evaluate on the day of surgery.   VS: BP (!) 161/72   Pulse 97   Temp 36.5 C   Resp 17   Ht 5' 1"  (1.549 m)   Wt 68 kg   SpO2 99%   BMI 28.34 kg/m    PROVIDERS: Binnie Rail, MD is PCP. Last visit 04/06/21. Loralie Champagne, MD is Cardiologist. Last 03/22/21. Thompson Grayer, MD is EP Cardiologist. Last visit 05/01/19. Truitt Merle, MD is HEM-ONC. Lasta visit 04/28/21. Ellouise Newer, MD is neurologist. Last visit 09/17/20.  Chesley Mires, MD is pulmonologist. Last visit 04/25/12.   LABS: Preoperative labs reviewed including from 04/28/21-05/19/21. Results include: Lab Results  Component Value Date   WBC 6.4 05/19/2021   HGB 14.1 05/19/2021   HCT 44.5 05/19/2021   PLT 193 05/19/2021   GLUCOSE 152 (H) 04/28/2021   ALT 15 04/28/2021   AST 20 04/28/2021   NA 144 04/28/2021   K 3.4 (L) 04/28/2021   CL 108 04/28/2021   CREATININE 0.56 04/28/2021   BUN 14 04/28/2021   CO2 29 04/28/2021   TSH 0.77 09/16/2020   INR 3.0 05/17/2021   HGBA1C 7.1 (H) 03/31/2021  PFTs 09/27/17: FVC 2.22 (90%), post 2.18 (88%), FEV1 1.66 (90%), post 1.60 (87%), DLCO unc 13.67 (63%).   EKG: 09/22/20: Ventricular-paced rhythm with occasional Premature ventricular complexes Abnormal ECG No significant change since last tracing Confirmed by Carlyle Dolly [phone]) on 09/23/2020 8:19:18 PM   CV: Echo 09/22/20: IMPRESSIONS   1. Left ventricular ejection  fraction, by estimation, is 45 to 50%. The  left ventricle has mildly decreased function. The left ventricle  demonstrates regional wall motion abnormalities with basal inferior and  basal inferolateral akinesis. There is mild  left ventricular hypertrophy. Left ventricular diastolic parameters are  indeterminate.   2. Right ventricular systolic function is mildly reduced. The right  ventricular size is normal. There is normal pulmonary artery systolic  pressure. The estimated right ventricular systolic pressure is 78.4 mmHg.   3. Left atrial size was severely dilated.   4. Right atrial size was moderately dilated.   5. Status post mitral valve repair. Trivial mitral regurgitation. Mean  gradient 4 mmHg, no significant regurgitation.   6. Tricuspid valve regurgitation is moderate.   7. The aortic valve is tricuspid. Aortic valve regurgitation is not  visualized. No aortic stenosis is present.   8. The inferior vena cava is normal in size with greater than 50%  respiratory variability, suggesting right atrial pressure of 3 mmHg.   9. The patient is in atrial fibrillation  (Comparison 03/19/19: EF 50%, basal inferior and inferolateral akinesis, mildly reduced RVSF, mild TR, PASP 32 mmHg; 06/19/18 LVEF 55-60%, no regional wall motion abnormalities, RV mildly-moderately reduced, mild-moderate TR, PA peak pressure 35 mmHg)     RHC/LHC 04/30/13: Findings: RA = 4 RV =  31/2/5 PA =  33/9 (19) PCW = 8 Fick cardiac output/index = 4.8/2.6 Thermo CO/CI = 3.3/1.8 PVR = 1.5 [place] SVR = 1,330 FA sat = 92% PA sat = 66%, 67% MV gradient = 7.5 mm Hg MVA = 1.9 cm2   Ao Pressure: 108/46 (69) LV Pressure: 109/4/7 There was no signficant gradient across the aortic valve on pullback.   Left main: Normal   LAD: Large vessel gives off large first diagonal. In proximal to mid LAD at bifurcation with the diagonal there is 50-60% lesion. Otherwise normal   LCX: Large vessel. normal   RCA: Large  dominant vessel. normal   LV-gram done in the RAO projection: Ejection fraction = 25% with significant dyssynchrony. No obvious MR   LIMA to LAD: atretic   Assessment: 1. Moderate 1v CAD 2. Severe LV dysfunction with evidence of dyssynchrony 3. Normal filling pressures without PAH 4. Mild to moderate mitral stenosis   Plan/Discussion:   I think main issue is heart failure due to low EF and ventricular dyssynchrony. She is s/p AV node ablation and is currently RV pacing. Would recommend consideration of CRT upgrade. If remains symptomatic can consider trial of inotropic support. D/w Dr. Ron Parker. Glori Bickers, MD)    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   Breast cancer ([place])    CAD (coronary artery disease)    LIMA to the LAD at time of mitral valve repair / LIMA atretic,,  February, 2011   Cataracts, bilateral    removed bilat    Cecal cancer 2014   colon   CHF (congestive heart failure)    Chronic systolic dysfunction of left ventricle 05/21/2013   Clotting disorder    21 yrs ago blood clot in heart    Colon cancer (HCC)    Colon polyp, hyperplastic    Diabetes mellitus type II, controlled 03/02/2009   Diabetic neuropathy 02/21/2018   [facility] - [facility]   Diverticulosis of colon    Diverticulosis of large intestine 08/04/2002   Ejection fraction < 50%    EF 30%, New diagnosis   february, 2011, etiology not clear, consider rate related tachycardia, and not use carvedilol, catheterization no constriction, /    EF 55-60% echo, June, 2011 /     Beta blocker stopped May, 2012 with reactive airway disease.  Patient does not tolerate metoprolol.  Carvedilol stopped  /    EF 55%, echo, February, 2012  //   EF 40-45%, septal dyssynergy, hypokinesis of the a   Essential hypertension 07/07/2008   Family history of colon cancer 04/29/2021   GERD (gastroesophageal reflux disease)    Hemorrhoids, internal    High cholesterol    History of CABG    Hyperlipidemia 07/07/2008   Lumbar radiculopathy, acute 06/17/2018   Major depressive disorder 07/19/2018   Mitral regurgitation    a. s/p repair with LAA ligation and MAZE at time of surgery   Mitral stenosis    Mild, February, 2011, post mitral valve repair / mild, echo, June, 2011  //  Mild functional mitral stenosis, echo, February, 2012  //   mild stenosis, echo, December, 2013  //  mild, echo, August, 2014   //   Mild, echo, April, 2016    Osteopenia    left hip    Pacemaker-Medtronic 05/20/2012   Pacemaker placed September, 2013   //   upgraded to biventricular CRT pacemaker November 04, 2013    Permanent atrial fibrillation    a. s/p MAZE b. s/p AVN ablation   Personal history of colonic polyps 04/29/2021   PONV (postoperative nausea and vomiting)    Pulmonary HTN    QT prolongation    Tikosyn and Effexor. QT prolonged October 13, 2011, peak is in dose reduced from 500  to -250 twice a day   Right ventricular dysfunction    Mild to moderate, echo, February, 2011 / normalized echo, June, 2011 /  RV normal, echo, February, 2012  //   right ventricle reported as good echo, December, 2013    S/P mitral valve repair    [facility] / Maze procedure/ atrial appendage removed were tied off    Sick sinus syndrome    s/p Medtronic dual chamber PPM 05/17/12    Sleep difficulties 09/13/2019   Vitamin D deficiency 02/20/2014    Past Surgical History:  Procedure Laterality Date   ABDOMINAL HYSTERECTOMY  ?2002   AV NODE ABLATION N/A 05/28/2012   Procedure: AV NODE ABLATION;  Surgeon: Thompson Grayer, MD;  Location: [facility] C[facility] LAB;  Service: Cardiovascular;  Laterality: N/A;   BI-VENTRICULAR PACEMAKER UPGRADE N/A 11/04/2013    upgrade of previously implanted dual chamber pacemaker to STJ Quadra Allure CRTP by Dr Rayann Heman   BREAST EXCISIONAL BIOPSY Left over 10 years ago   benign   BREAST SURGERY     CARDIOVERSION  02/08/2012   Procedure: CARDIOVERSION;  Surgeon: Carlena Bjornstad, MD;  Location: [place];  Service: Cardiovascular;  Laterality: N/A;   CARDIOVERSION  02/29/2012   Procedure: CARDIOVERSION;  Surgeon: Carlena Bjornstad, MD;  Location: [place];  Service: Cardiovascular;  Laterality: N/A;   CARDIOVERSION  05/15/2012   Procedure: CARDIOVERSION;  Surgeon: Carlena Bjornstad, MD;  Location: [place];  Service: Cardiovascular;  Laterality: N/A;   CATARACT EXTRACTION, BILATERAL  2017   [place]   COLON SURGERY  2014   right hemicolectomy    COLONOSCOPY     CORONARY ARTERY BYPASS GRAFT  2001   CABG X1 "at time of mitral valve repair" (12/04/2012   DIAGNOSTIC LAPAROSCOPIC LIVER BIOPSY Left 12/03/2012   Procedure: DIAGNOSTIC LAPAROSCOPIC LIVER BIOPSY;  Surgeon: Stark Klein, MD;  Location: [place];  Service: General;  Laterality: Left;   EYE SURGERY     bilateral cataract surgery   LAPAROSCOPIC RIGHT HEMI COLECTOMY  12/03/2012   Procedure: LAPAROSCOPIC RIGHT HEMI COLECTOMY;  Surgeon: Stark Klein, MD;  Location: [place];  Service: General;;   LUMBAR LAMINECTOMY/ DECOMPRESSION WITH MET-RX Left 06/17/2018   Procedure: left Lumbar three-fourextraforaminal Microdiscectomy with Met-Rx;  Surgeon: Kristeen Miss, MD;  Location: [place];  Service: Neurosurgery;  Laterality: Left;   MAZE  2001   w/ MVR & CABG   MITRAL VALVE REPAIR  2001   "anterior and posterior leaflets" (12/04/2012)   PERMANENT PACEMAKER INSERTION N/A 05/17/2012   MDT Adapta L implanted by Dr Rayann Heman for tachy/brady syndrome   POLYPECTOMY     TONSILLECTOMY AND ADENOIDECTOMY  ~ 2263   UMBILICAL HERNIA REPAIR N/A 12/03/2012   Procedure: HERNIA REPAIR UMBILICAL ADULT;  Surgeon: Stark Klein, MD;  Location: [place];  Service: General;  Laterality: N/A;     MEDICATIONS:  ACCU-CHEK SOFTCLIX LANCETS lancets   acetaminophen (TYLENOL) 500 MG tablet   amoxicillin (AMOXIL) 500 MG capsule   atorvastatin (LIPITOR) 40 MG tablet   Blood Glucose Monitoring Suppl (ACCU-CHEK AVIVA PLUS) w/Device KIT   buPROPion (WELLBUTRIN XL) 150 MG 24 hr tablet   carvedilol (COREG) 12.5 MG tablet   cholecalciferol (VITAMIN D) 1000 UNITS tablet   enoxaparin (LOVENOX) 100 MG/ML injection   enoxaparin (LOVENOX) 60 MG/0.6ML injection   fish oil-omega-3 fatty acids 1000 MG capsule   glucose blood (ACCU-CHEK AVIVA PLUS) test strip   JARDIANCE 25 MG TABS tablet   loratadine (CLARITIN) 10 MG tablet   Polyethyl Glycol-Propyl Glycol (SYSTANE) 0.4-0.3 % SOLN   sodium chloride (OCEAN) 0.65 % SOLN nasal spray   spironolactone (ALDACTONE) 25 MG tablet   trandolapril (MAVIK) 4 MG tablet   warfarin (COUMADIN) 2.5 MG tablet   No current facility-administered medications for this encounter.    Myra Gianotti, PA-C Surgical Short Stay/Anesthesiology [facility] Phone [phone] [facility] Phone [phone] 05/20/2021 6:22 PM

## 2021-05-24 ENCOUNTER — Ambulatory Visit: Payer: Medicare Other

## 2021-05-26 ENCOUNTER — Other Ambulatory Visit: Payer: Self-pay

## 2021-05-26 ENCOUNTER — Ambulatory Visit (HOSPITAL_COMMUNITY): Payer: Medicare Other | Admitting: Certified Registered Nurse Anesthetist

## 2021-05-26 ENCOUNTER — Encounter (HOSPITAL_COMMUNITY): Payer: Self-pay | Admitting: General Surgery

## 2021-05-26 ENCOUNTER — Encounter (HOSPITAL_COMMUNITY)
Admission: RE | Disposition: A | Discharge status: Home / Self Care | Payer: Self-pay | Source: Home / Self Care | Attending: General Surgery

## 2021-05-26 ENCOUNTER — Ambulatory Visit (HOSPITAL_COMMUNITY): Payer: Medicare Other | Admitting: Vascular Surgery

## 2021-05-26 ENCOUNTER — Other Ambulatory Visit (HOSPITAL_COMMUNITY): Payer: Self-pay

## 2021-05-26 ENCOUNTER — Ambulatory Visit (HOSPITAL_COMMUNITY)
Admission: RE | Admit: 2021-05-26 | Discharge: 2021-05-26 | Disposition: A | Discharge status: Home / Self Care | Payer: Medicare Other | Attending: General Surgery | Admitting: General Surgery

## 2021-05-26 DIAGNOSIS — Z9049 Acquired absence of other specified parts of digestive tract: Secondary | ICD-10-CM | POA: Diagnosis not present

## 2021-05-26 DIAGNOSIS — Z885 Allergy status to narcotic agent status: Secondary | ICD-10-CM | POA: Diagnosis not present

## 2021-05-26 DIAGNOSIS — Z887 Allergy status to serum and vaccine status: Secondary | ICD-10-CM | POA: Insufficient documentation

## 2021-05-26 DIAGNOSIS — Z800 Family history of malignant neoplasm of digestive organs: Secondary | ICD-10-CM | POA: Insufficient documentation

## 2021-05-26 DIAGNOSIS — Z8249 Family history of ischemic heart disease and other diseases of the circulatory system: Secondary | ICD-10-CM | POA: Insufficient documentation

## 2021-05-26 DIAGNOSIS — I4891 Unspecified atrial fibrillation: Secondary | ICD-10-CM | POA: Diagnosis not present

## 2021-05-26 DIAGNOSIS — Z833 Family history of diabetes mellitus: Secondary | ICD-10-CM | POA: Insufficient documentation

## 2021-05-26 DIAGNOSIS — Z79899 Other long term (current) drug therapy: Secondary | ICD-10-CM | POA: Insufficient documentation

## 2021-05-26 DIAGNOSIS — Z7901 Long term (current) use of anticoagulants: Secondary | ICD-10-CM | POA: Diagnosis not present

## 2021-05-26 DIAGNOSIS — I2510 Atherosclerotic heart disease of native coronary artery without angina pectoris: Secondary | ICD-10-CM | POA: Diagnosis not present

## 2021-05-26 DIAGNOSIS — I110 Hypertensive heart disease with heart failure: Secondary | ICD-10-CM | POA: Diagnosis not present

## 2021-05-26 DIAGNOSIS — I509 Heart failure, unspecified: Secondary | ICD-10-CM | POA: Diagnosis not present

## 2021-05-26 DIAGNOSIS — I442 Atrioventricular block, complete: Secondary | ICD-10-CM | POA: Insufficient documentation

## 2021-05-26 DIAGNOSIS — Z950 Presence of cardiac pacemaker: Secondary | ICD-10-CM | POA: Insufficient documentation

## 2021-05-26 DIAGNOSIS — E1140 Type 2 diabetes mellitus with diabetic neuropathy, unspecified: Secondary | ICD-10-CM | POA: Insufficient documentation

## 2021-05-26 DIAGNOSIS — Z170 Estrogen receptor positive status [ER+]: Secondary | ICD-10-CM | POA: Diagnosis not present

## 2021-05-26 DIAGNOSIS — C50512 Malignant neoplasm of lower-outer quadrant of left female breast: Secondary | ICD-10-CM | POA: Insufficient documentation

## 2021-05-26 DIAGNOSIS — N6092 Unspecified benign mammary dysplasia of left breast: Secondary | ICD-10-CM | POA: Insufficient documentation

## 2021-05-26 HISTORY — PX: BREAST LUMPECTOMY: SHX2

## 2021-05-26 HISTORY — DX: Family history of other specified conditions: Z84.89

## 2021-05-26 LAB — GLUCOSE, CAPILLARY
Glucose-Capillary: 162 mg/dL — ABNORMAL HIGH (ref 70–99)
Glucose-Capillary: 137 mg/dL — ABNORMAL HIGH (ref 70–99)

## 2021-05-26 LAB — APTT: aPTT: 33 seconds (ref 24–36)

## 2021-05-26 LAB — PROTIME-INR
INR: 1.2 (ref 0.8–1.2)
Prothrombin Time: 14.9 seconds (ref 11.4–15.2)

## 2021-05-26 SURGERY — BREAST LUMPECTOMY
Anesthesia: General | Site: Breast | Laterality: Left

## 2021-05-26 MED ORDER — APREPITANT 40 MG PO CAPS: 40.0000 mg | ORAL_CAPSULE | Freq: Once | ORAL | Status: AC

## 2021-05-26 MED ORDER — ORAL CARE MOUTH RINSE
15.0000 mL | Freq: Once | OROMUCOSAL | Status: AC
Start: 2021-05-26 — End: 2021-05-26

## 2021-05-26 MED ORDER — LIDOCAINE 2% (20 MG/ML) 5 ML SYRINGE
INTRAMUSCULAR | Status: DC | PRN
  Administered 2021-05-26: 80 mg via INTRAVENOUS

## 2021-05-26 MED ORDER — OXYCODONE HCL 5 MG PO TABS: 5.0000 mg | ORAL_TABLET | Freq: Once | ORAL | Status: DC | PRN

## 2021-05-26 MED ORDER — ONDANSETRON HCL 4 MG/2ML IJ SOLN
INTRAMUSCULAR | Status: AC
  Filled 2021-05-26: qty 2

## 2021-05-26 MED ORDER — DEXAMETHASONE SODIUM PHOSPHATE 10 MG/ML IJ SOLN
INTRAMUSCULAR | Status: DC | PRN
  Administered 2021-05-26: 4 mg via INTRAVENOUS

## 2021-05-26 MED ORDER — OXYCODONE HCL 5 MG/5ML PO SOLN
5.0000 mg | Freq: Once | ORAL | Status: DC | PRN
Start: 2021-05-26 — End: 2021-05-26

## 2021-05-26 MED ORDER — CHLORHEXIDINE GLUCONATE CLOTH 2 % EX PADS: 6.0000 | MEDICATED_PAD | Freq: Once | CUTANEOUS | Status: DC

## 2021-05-26 MED ORDER — BUPIVACAINE-EPINEPHRINE (PF) 0.25% -1:200000 IJ SOLN
INTRAMUSCULAR | Status: AC
  Filled 2021-05-26: qty 30

## 2021-05-26 MED ORDER — LIDOCAINE HCL 1 % IJ SOLN
INTRAMUSCULAR | Status: DC | PRN
  Administered 2021-05-26: 24 mL

## 2021-05-26 MED ORDER — ONDANSETRON HCL 4 MG/2ML IJ SOLN
INTRAMUSCULAR | Status: DC | PRN
  Administered 2021-05-26: 4 mg via INTRAVENOUS

## 2021-05-26 MED ORDER — FENTANYL CITRATE (PF) 250 MCG/5ML IJ SOLN
INTRAMUSCULAR | Status: AC
  Filled 2021-05-26: qty 5

## 2021-05-26 MED ORDER — DEXAMETHASONE SODIUM PHOSPHATE 10 MG/ML IJ SOLN
INTRAMUSCULAR | Status: AC
  Filled 2021-05-26: qty 1

## 2021-05-26 MED ORDER — OXYCODONE HCL 5 MG PO TABS
2.5000 mg | ORAL_TABLET | Freq: Four times a day (QID) | ORAL | 0 refills | Status: DC | PRN
  Filled 2021-05-26: qty 5, 2d supply, fill #0

## 2021-05-26 MED ORDER — PROPOFOL 10 MG/ML IV BOLUS
INTRAVENOUS | Status: DC | PRN
  Administered 2021-05-26: 150 mg via INTRAVENOUS

## 2021-05-26 MED ORDER — FENTANYL CITRATE (PF) 100 MCG/2ML IJ SOLN: 25.0000 ug | INTRAMUSCULAR | Status: DC | PRN

## 2021-05-26 MED ORDER — 0.9 % SODIUM CHLORIDE (POUR BTL) OPTIME
TOPICAL | Status: DC | PRN
  Administered 2021-05-26: 1000 mL

## 2021-05-26 MED ORDER — ACETAMINOPHEN 500 MG PO TABS
1000.0000 mg | ORAL_TABLET | ORAL | Status: AC
Start: 2021-05-26 — End: 2021-05-26
  Administered 2021-05-26: 1000 mg via ORAL
  Filled 2021-05-26: qty 2

## 2021-05-26 MED ORDER — CHLORHEXIDINE GLUCONATE 0.12 % MT SOLN
15.0000 mL | Freq: Once | OROMUCOSAL | Status: AC
  Administered 2021-05-26: 15 mL via OROMUCOSAL
  Filled 2021-05-26: qty 15

## 2021-05-26 MED ORDER — LIDOCAINE HCL 1 % IJ SOLN
INTRAMUSCULAR | Status: AC
  Filled 2021-05-26: qty 20

## 2021-05-26 MED ORDER — PROPOFOL 500 MG/50ML IV EMUL
INTRAVENOUS | Status: DC | PRN
  Administered 2021-05-26: 125 ug/kg/min via INTRAVENOUS

## 2021-05-26 MED ORDER — LACTATED RINGERS IV SOLN: INTRAVENOUS | Status: DC

## 2021-05-26 MED ORDER — LIDOCAINE HCL (PF) 2 % IJ SOLN
INTRAMUSCULAR | Status: AC
  Filled 2021-05-26: qty 5

## 2021-05-26 MED ORDER — FENTANYL CITRATE (PF) 100 MCG/2ML IJ SOLN
INTRAMUSCULAR | Status: DC | PRN
  Administered 2021-05-26 (×2): 25 ug via INTRAVENOUS

## 2021-05-26 MED ORDER — CEFAZOLIN SODIUM-DEXTROSE 2-4 GM/100ML-% IV SOLN
2.0000 g | INTRAVENOUS | Status: AC
  Administered 2021-05-26: 2 g via INTRAVENOUS
  Filled 2021-05-26: qty 100

## 2021-05-26 MED ORDER — APREPITANT 40 MG PO CAPS
ORAL_CAPSULE | ORAL | Status: AC
  Administered 2021-05-26: 40 mg via ORAL
  Filled 2021-05-26: qty 1

## 2021-05-26 SURGICAL SUPPLY — 39 items
BAG COUNTER SPONGE SURGICOUNT (BAG) ×2 IMPLANT
BINDER BREAST LRG (GAUZE/BANDAGES/DRESSINGS) IMPLANT
BINDER BREAST XLRG (GAUZE/BANDAGES/DRESSINGS) IMPLANT
BLADE SURG 10 STRL SS (BLADE) ×2 IMPLANT
CANISTER SUCT 3000ML PPV (MISCELLANEOUS) IMPLANT
CHLORAPREP W/TINT 26 (MISCELLANEOUS) ×2 IMPLANT
CLIP VESOCCLUDE LG 6/CT (CLIP) ×2 IMPLANT
COVER PROBE W GEL 5X96 (DRAPES) ×2 IMPLANT
COVER SURGICAL LIGHT HANDLE (MISCELLANEOUS) ×2 IMPLANT
DERMABOND ADVANCED (GAUZE/BANDAGES/DRESSINGS) ×1
DERMABOND ADVANCED .7 DNX12 (GAUZE/BANDAGES/DRESSINGS) ×1 IMPLANT
DEVICE DUBIN SPECIMEN MAMMOGRA (MISCELLANEOUS) ×2 IMPLANT
DRAPE CHEST BREAST 15X10 FENES (DRAPES) ×2 IMPLANT
DRSG PAD ABDOMINAL 8X10 ST (GAUZE/BANDAGES/DRESSINGS) ×4 IMPLANT
ELECT COATED BLADE 2.86 ST (ELECTRODE) ×2 IMPLANT
ELECT REM PT RETURN 9FT ADLT (ELECTROSURGICAL) ×2
ELECTRODE REM PT RTRN 9FT ADLT (ELECTROSURGICAL) ×1 IMPLANT
GAUZE SPONGE 4X4 12PLY STRL LF (GAUZE/BANDAGES/DRESSINGS) ×2 IMPLANT
GLOVE SURG ENC MOIS LTX SZ6 (GLOVE) ×2 IMPLANT
GLOVE SURG UNDER LTX SZ6.5 (GLOVE) ×2 IMPLANT
GOWN STRL REUS W/ TWL LRG LVL3 (GOWN DISPOSABLE) ×1 IMPLANT
GOWN STRL REUS W/TWL 2XL LVL3 (GOWN DISPOSABLE) ×2 IMPLANT
GOWN STRL REUS W/TWL LRG LVL3 (GOWN DISPOSABLE) ×2
KIT BASIN OR (CUSTOM PROCEDURE TRAY) ×2 IMPLANT
KIT MARKER MARGIN INK (KITS) ×2 IMPLANT
LIGHT WAVEGUIDE WIDE FLAT (MISCELLANEOUS) IMPLANT
NEEDLE HYPO 25GX1X1/2 BEV (NEEDLE) ×2 IMPLANT
NS IRRIG 1000ML POUR BTL (IV SOLUTION) IMPLANT
PACK GENERAL/GYN (CUSTOM PROCEDURE TRAY) ×2 IMPLANT
STRIP CLOSURE SKIN 1/2X4 (GAUZE/BANDAGES/DRESSINGS) ×2 IMPLANT
SUT MNCRL AB 4-0 PS2 18 (SUTURE) ×2 IMPLANT
SUT SILK 2 0 SH (SUTURE) IMPLANT
SUT VIC AB 2-0 SH 27 (SUTURE) ×2
SUT VIC AB 2-0 SH 27XBRD (SUTURE) ×1 IMPLANT
SUT VIC AB 3-0 SH 27 (SUTURE) ×2
SUT VIC AB 3-0 SH 27X BRD (SUTURE) ×1 IMPLANT
SYR CONTROL 10ML LL (SYRINGE) ×2 IMPLANT
TOWEL GREEN STERILE (TOWEL DISPOSABLE) ×2 IMPLANT
TOWEL GREEN STERILE FF (TOWEL DISPOSABLE) ×2 IMPLANT

## 2021-05-26 NOTE — H&P (Signed)
Subjective   Chief Complaint: Breast Cancer   History of Present Illness: Anita Duke is a 80 y.o. female who is seen today as an office consultation at the request of Dr. Quay Burow for evaluation of Breast Cancer .  The patient is an 80 year old female who has a new diagnosis of left breast cancer. She presented with a palpable mass in the left breast. She has always had pretty dense breasts and has had 2 surgeries on her left breast by Dr. Annamaria Boots and Dr. Mendel Ryder. This time, she was found to have a 1.1 cm mass at 3 o'clock that is spiculated. No LAD was seen. Core needle biopsy was performed and showed a grade 2 invasive ductal carcinoma, ER/PR positive, her 2 negative, Ki 67 5%.   I previously treated her for colon cancer.  She presented with adenocarcinoma found in a large polyp on colonoscopy 2014.  She underwent laparoscopic right hemicolectomy 12/2012.  Pathology was pT2N0 with 0/12 nodes positive.  Liver biopsy was negative for malignancy.  Her tumor was microsatellite stable.    She had her follow up colonoscopy in May 2018 by Dr. Ardis Hughs that was negative for cancer, but she had six polyps that were tubular adenomas.  CEA was redrawn and was normal.    Of note, she is on coumadin for atrial fibrillation at the direction of Dr. Aundra Dubin.   Diagnostic mammogram: 04/09/21 [facility] DIGITAL DIAGNOSTIC BILATERAL MAMMOGRAM WITH TOMOSYNTHESIS AND CAD; ULTRASOUND LEFT BREAST LIMITED   TECHNIQUE: Bilateral digital diagnostic mammography and breast tomosynthesis was performed. The images were evaluated with computer-aided detection.; Targeted ultrasound examination of the left breast was performed.   COMPARISON: Previous exam(s).   ACR Breast Density Category c: The breast tissue is heterogeneously dense, which may obscure small masses.   FINDINGS: There is a spiculated mass in the region of the patient's left breast lump. No other suspicious findings are identified in either breast.   On  physical exam, there is a palpable lump in the lateral left breast.   Targeted ultrasound is performed, showing a spiculated mass in the left breast containing calcifications at 3 o'clock, 6 cm from the nipple measuring 11 x 9 x 9 mm. No axillary adenopathy.   IMPRESSION: Suspicious left breast mass. No other suspicious findings.   RECOMMENDATION: Recommend ultrasound-guided biopsy of the suspicious left breast mass. The mass is at the site of a previous biopsy for calcifications which demonstrated usual ductal hyperplasia and fibrocystic change.   I have discussed the findings and recommendations with the patient. If applicable, a reminder letter will be sent to the patient regarding the next appointment.   BI-RADS CATEGORY 4: Suspicious.  Pathology core needle biopsy: 04/11/21 Breast, left, needle core biopsy, 3:00 - INVASIVE DUCTAL CARCINOMA, SEE COMMENT. - DUCTAL CARCINOMA IN SITU. Microscopic Comment The carcinoma appears grade 2 and measures 7 mm in greatest linear extent.  Receptors: The tumor cells are NEGATIVE for Her2 (1+). Estrogen Receptor: 100%, POSITIVE, STRONG STAINING INTENSITY Progesterone Receptor: 100%, POSITIVE, STRONG STAINING INTENSITY Proliferation Marker Ki67: 5%  Review of Systems: A complete review of systems was obtained from the patient. I have reviewed this information and discussed as appropriate with the patient. See HPI as well for other ROS.  Review of Systems  All other systems reviewed and are negative.   Medical History: Past Medical History:  Diagnosis Date   Arthritis   CHF (congestive heart failure) (CMS-HCC)   Diabetes mellitus without complication (CMS-HCC)   Heart valve  disease   History of cancer   Patient Active Problem List  Diagnosis   Acquired complete AV block (CMS-HCC)   Atrial fibrillation (CMS-HCC)   CAD (coronary artery disease)   Cecal cancer (CMS-HCC)   Diabetes mellitus type II, controlled (CMS-HCC)    Chronic systolic dysfunction of left ventricle   Diabetic neuropathy (CMS-HCC)   Essential hypertension   Pacemaker   Spinal stenosis of lumbar region   Malignant neoplasm of upper-outer quadrant of left breast in female, estrogen receptor positive (CMS-HCC)   Anticoagulated on Coumadin   Past Surgical History:  Procedure Laterality Date   breast surgery   heart surgery   LAPAROSCOPIC CHOLECYSTECTOMY    Allergies  Allergen Reactions   Morphine Nausea And Vomiting and Other (See Comments)  vomiting   Metformin Other (See Comments)  diarrhea   Sitagliptin Other (See Comments)  Headaches, did not feel well Headaches, did not feel well   Tetanus Toxoid Fluid Other (See Comments) and Rash  "years ago" "years ago"   Current Outpatient Medications on File Prior to Visit  Medication Sig Dispense Refill   atorvastatin (LIPITOR) 40 MG tablet atorvastatin 40 mg tablet   buPROPion (WELLBUTRIN XL) 300 MG XL tablet bupropion HCl XL 300 mg 24 hr tablet, extended release   carvediloL (COREG) 12.5 MG tablet carvedilol 12.5 mg tablet   empagliflozin (JARDIANCE) 25 mg tablet Jardiance 25 mg tablet   spironolactone (ALDACTONE) 25 MG tablet spironolactone 25 mg tablet   trandolapriL (MAVIK) 4 MG tablet trandolapril 4 mg tablet   acetaminophen (TYLENOL) 325 MG tablet Take by mouth   blood glucose diagnostic (ACCU-CHEK AVIVA PLUS TEST STRP) test strip Accu-Chek Aviva Plus test strips   cholecalciferol (VITAMIN D3) 1000 unit tablet Take by mouth   docosahexaenoic acid-epa 120-180 mg Cap Take 1 g by mouth 2 (two) times daily   enoxaparin (LOVENOX) 60 mg/0.6 mL injection syringe enoxaparin 60 mg/0.6 mL subcutaneous syringe INJECT 1 SYRINGE INTO THE SKIN DAILY FOR 7 DAYS   FUROsemide (LASIX) 20 MG tablet furosemide 20 mg tablet TAKE 1 TABLET BY MOUTH ONCE DAILY FOR 2 DAYS THEN MAY TAKE 1 TABLET AS NEEDED FOR 2 LB WEIGHT GAIN IN 24 HOURS OR 3 TO 4 POUNDS IN A WEEK   loratadine (CLARITIN) 10 mg  tablet Take 10 mg by mouth once daily   omega-3 acid ethyl esters (LOVAZA) 1 gram capsule Take by mouth   potassium chloride (KLOR-CON) 10 mEq ER tablet potassium chloride ER 10 mEq tablet,extended release(part/cryst) TAKE 1 TABLET BY MOUTH ONCE DAILY ALONG WITH LASIX FOR 2 DAYS ONLY   traZODone (DESYREL) 50 MG tablet trazodone 50 mg tablet   venlafaxine (EFFEXOR-XR) 37.5 MG XR capsule venlafaxine ER 37.5 mg capsule,extended release 24 hr   warfarin (COUMADIN) 2.5 MG tablet warfarin 2.5 mg tablet   No current facility-administered medications on file prior to visit.   Family History  Problem Relation Age of Onset   High blood pressure (Hypertension) Mother   Hyperlipidemia (Elevated cholesterol) Mother   Diabetes Mother   High blood pressure (Hypertension) Father   Coronary Artery Disease (Blocked arteries around heart) Father   Colon cancer Maternal Aunt   Colon cancer Paternal Aunt    Social History   Tobacco Use  Smoking Status Never Smoker  Smokeless Tobacco Never Used    Social History   Socioeconomic History   Marital status: Widowed  Tobacco Use   Smoking status: Never Smoker   Smokeless tobacco: Never Used  Substance and  Sexual Activity   Alcohol use: Never   Drug use: Never   Objective:   Vitals:  04/18/21 0953  BP: (!) 160/86  Pulse: 103  Temp: 36.2 C (97.1 F)  Weight: 67.4 kg (148 lb 9.6 oz)  Height: 152.4 cm (5')   Body mass index is 29.02 kg/m.  Gen: No acute distress. Well nourished and well groomed.  Neurological: Alert and oriented to person, place, and time. Coordination normal.  Head: Normocephalic and atraumatic.  Eyes: Conjunctivae are normal. Pupils are equal, round, and reactive to light. No scleral icterus.  Neck: Normal range of motion. Neck supple. No tracheal deviation or thyromegaly present.  Cardiovascular: Normal rate, regular rhythm, normal heart sounds and intact distal pulses. Exam reveals no gallop and no friction rub. No  murmur heard. Breast: bruising present on left laterally. Still there is a palpable mass at 3 o'clock. Mobile. No LAD. Left breast smaller than right. Some ptosis. No nipple retraction or nipple discharge.  Respiratory: Effort normal. No respiratory distress. No chest wall tenderness. Breath sounds normal. No wheezes, rales or rhonchi.  GI: Soft. Bowel sounds are normal. The abdomen is soft and nontender. There is no rebound and no guarding.  Musculoskeletal: Normal range of motion. Extremities are nontender.  Lymphadenopathy: No cervical, preauricular, postauricular or axillary adenopathy is present Skin: Skin is warm and dry. No rash noted. No diaphoresis. No erythema. No pallor. No clubbing, cyanosis, or edema.  Psychiatric: Normal mood and affect. Behavior is normal. Judgment and thought content normal.   Labs 03/31/21  CBC, CMET near normal other than glucose 133 and slightly elevated T bili at 1.3, but this is stable over time.   Assessment and Plan:   Malignant neoplasm of upper-outer quadrant of left breast in female, estrogen receptor positive (CMS-HCC) Will plan left lumpectomy. Given that mass is palpable, I will not plan seed. Also, given age, size of tumor and favorable prognostic profile, I will not plan sentinel node.   I will refer to oncology to discuss antiestrogen tx.   I will also send to genetics as this is her second cancer and she has had many polyps in her colon.   The surgical procedure was described to the patient. I discussed the incision type and location and that we would need radiology involved on with a wire or seed marker and/or sentinel node.   The risks and benefits of the procedure were described to the patient and she wishes to proceed.   We discussed the risks bleeding, infection, damage to other structures, need for further procedures/surgeries. We discussed the risk of seroma. The patient was advised if the area in the breast in cancer, we may need to  go back to surgery for additional tissue to obtain negative margins or for a lymph node biopsy. The patient was advised that these are the most common complications, but that others can occur as well. They were advised against taking aspirin or other anti-inflammatory agents/blood thinners the week before surgery.   Anticoagulated on Coumadin Will send Dr. Aundra Dubin a message regarding holding coumadin.   No follow-ups on file.  Milus Height, MD FACS Surgical Oncology, General Surgery, Trauma and [place] [facility] [place]

## 2021-05-26 NOTE — Transfer of Care (Signed)
Immediate Anesthesia Transfer of Care Note  Patient: Anita Duke  Procedure(s) Performed: LEFT BREAST LUMPECTOMY (Left: Breast)  Patient Location: PACU  Anesthesia Type:General  Level of Consciousness: awake, alert  and oriented  Airway & Oxygen Therapy: Patient Spontanous Breathing and Patient connected to face mask oxygen  Post-op Assessment: Report given to RN and Post -op Vital signs reviewed and stable  Post vital signs: Reviewed and stable  Last Vitals:  Vitals Value Taken Time  BP 126/56 05/26/21 1145  Temp 36.4 C 05/26/21 1145  Pulse 69 05/26/21 1146  Resp 18 05/26/21 1146  SpO2 99 % 05/26/21 1146  Vitals shown include unvalidated device data.  Last Pain:  Vitals:   05/26/21 0915  TempSrc:   PainSc: 0-No pain         Complications: No notable events documented.

## 2021-05-26 NOTE — Discharge Instructions (Addendum)
[facility] Office Phone Number [phone]  BREAST BIOPSY/ PARTIAL MASTECTOMY: POST OP INSTRUCTIONS  Always review your discharge instruction sheet given to you by the facility where your surgery was performed.  IF YOU HAVE DISABILITY OR FAMILY LEAVE FORMS, YOU MUST BRING THEM TO THE OFFICE FOR PROCESSING.  DO NOT GIVE THEM TO YOUR DOCTOR.  A prescription for pain medication may be given to you upon discharge.  Take your pain medication as prescribed, if needed.  If narcotic pain medicine is not needed, then you may take acetaminophen (Tylenol) or ibuprofen (Advil) as needed. Take your usually prescribed medications unless otherwise directed If you need a refill on your pain medication, please contact your pharmacy.  They will contact our office to request authorization.  Prescriptions will not be filled after 5pm or on week-ends. You should eat very light the first 24 hours after surgery, such as soup, crackers, pudding, etc.  Resume your normal diet the day after surgery. Most patients will experience some swelling and bruising in the breast.  Ice packs and a good support bra will help.  Swelling and bruising can take several days to resolve.  It is common to experience some constipation if taking pain medication after surgery.  Increasing fluid intake and taking a stool softener will usually help or prevent this problem from occurring.  A mild laxative (Milk of Magnesia or Miralax) should be taken according to package directions if there are no bowel movements after 48 hours. Unless discharge instructions indicate otherwise, you may remove your bandages 48 hours after surgery, and you may shower at that time.  You may have steri-strips (small skin tapes) in place directly over the incision.  These strips should be left on the skin for 7-10 days.   Any sutures or staples will be removed at the office during your follow-up visit. ACTIVITIES:  You may resume regular daily activities  (gradually increasing) beginning the next day.  Wearing a good support bra or sports bra (or the breast binder) minimizes pain and swelling.  You may have sexual intercourse when it is comfortable. You may drive when you no longer are taking prescription pain medication, you can comfortably wear a seatbelt, and you can safely maneuver your car and apply brakes. RETURN TO WORK:  __________1 week_______________ You should see your doctor in the office for a follow-up appointment approximately two weeks after your surgery.  Your doctor's nurse will typically make your follow-up appointment when she calls you with your pathology report.  Expect your pathology report 2-3 business days after your surgery.  You may call to check if you do not hear from us after three days.   WHEN TO CALL YOUR DOCTOR: Fever over 101.0 Nausea and/or vomiting. Extreme swelling or bruising. Continued bleeding from incision. Increased pain, redness, or drainage from the incision.  The clinic staff is available to answer your questions during regular business hours.  Please don't hesitate to call and ask to speak to one of the nurses for clinical concerns.  If you have a medical emergency, go to the nearest emergency room or call 911.  A surgeon from [facility] is always on call at the hospital.  For further questions, please visit [email or web address]   

## 2021-05-26 NOTE — Anesthesia Procedure Notes (Signed)
Procedure Name: LMA Insertion Date/Time: 05/26/2021 10:43 AM Performed by: Genelle Bal, CRNA Pre-anesthesia Checklist: Patient identified, Emergency Drugs available, Suction available and Patient being monitored Patient Re-evaluated:Patient Re-evaluated prior to induction Oxygen Delivery Method: Circle system utilized Preoxygenation: Pre-oxygenation with 100% oxygen Induction Type: IV induction Ventilation: Mask ventilation without difficulty LMA: LMA inserted LMA Size: 4.0 Number of attempts: 1 Airway Equipment and Method: Bite block Placement Confirmation: positive ETCO2 Tube secured with: Tape Dental Injury: Teeth and Oropharynx as per pre-operative assessment

## 2021-05-26 NOTE — Progress Notes (Signed)
Device Rep, Dionne Milo at bedside to reset pacemaker prior tp discharge.

## 2021-05-26 NOTE — Interval H&P Note (Signed)
History and Physical Interval Note:  05/26/2021 9:46 AM  Anita Duke  has presented today for surgery, with the diagnosis of LEFT BREAST CANCER.  The various methods of treatment have been discussed with the patient and family. After consideration of risks, benefits and other options for treatment, the patient has consented to  Procedure(s): LEFT BREAST LUMPECTOMY (Left) as a surgical intervention.  The patient's history has been reviewed, patient examined, no change in status, stable for surgery.  I have reviewed the patient's chart and labs.  Questions were answered to the patient's satisfaction.     Stark Klein

## 2021-05-26 NOTE — Anesthesia Postprocedure Evaluation (Signed)
Anesthesia Post Note  Patient: Anita Duke  Procedure(s) Performed: LEFT BREAST LUMPECTOMY (Left: Breast)     Patient location during evaluation: PACU Anesthesia Type: General Level of consciousness: awake and alert and oriented Pain management: pain level controlled Vital Signs Assessment: post-procedure vital signs reviewed and stable Respiratory status: spontaneous breathing, nonlabored ventilation and respiratory function stable Cardiovascular status: blood pressure returned to baseline and stable Postop Assessment: no apparent nausea or vomiting Anesthetic complications: no   No notable events documented.  Last Vitals:  Vitals:   05/26/21 1200 05/26/21 1215  BP: 132/61 134/61  Pulse: 70 71  Resp: 16 19  Temp:  (!) 36.4 C  SpO2: 98% 96%    Last Pain:  Vitals:   05/26/21 1215  TempSrc:   PainSc: 0-No pain                 Lavonya Hoerner A.

## 2021-05-26 NOTE — Progress Notes (Signed)
Spoke with rep from [place] who advised they would page local representative Shearon Stalls to get ETA. Awaiting phone call.

## 2021-05-26 NOTE — Op Note (Signed)
Left Breast Lumpectomy   Indications: This patient presents with history of left breast cancer with clinically negative axillary lymph node exam.  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 The risks, benefits, complications, treatment options, and expected outcomes were discussed with the patient. The possibilities of reaction to medication, pulmonary aspiration, bleeding, infection, the need for additional procedures, failure to diagnose a condition, and creating a complication requiring transfusion or operation were discussed with the patient. The patient concurred with the proposed plan, giving informed consent.  The site of surgery properly noted/marked. The patient was taken to Operating Room # 1, identified as Anita Duke and the procedure verified as left Breast Lumpectomy. A Time Out was held and the above information confirmed.   After induction of anesthesia, the left breast and chest were prepped and draped in standard fashion.  The lumpectomy was performed by creating a transverse incision near the mass.  Skin hooks were used to elevate the skin and the cautery was used to dissect around the mass.  Dissection was carried down to the pectoral fascia.  The specimen was marked with the margin marker paint kit.  The specimen was placed in the faxatron to confirm presence on the biopsy clip.  Hemostasis was achieved with cautery.  Large clips were placed on the specimen cavity edges for radiation.   The wound was irrigated and closed with a 3-0 Vicryl deep dermal interrupted and a 4-0 Monocryl subcuticular closure in layers.      Sterile dressings were applied. At the end of the  operation, all sponge, instrument, and needle counts were correct.  Findings: grossly clear surgical margins. Anterior and inferior margin is skin, posterior margin is pectoralis  Estimated Blood Loss:  Minimal         Specimens: left breast lumpectomy                Complications:  None; patient tolerated the procedure well.         Disposition: PACU - hemodynamically stable.         Condition: stable      

## 2021-05-27 ENCOUNTER — Encounter (HOSPITAL_COMMUNITY): Payer: Self-pay | Admitting: General Surgery

## 2021-05-30 ENCOUNTER — Telehealth: Payer: Self-pay | Admitting: Genetic Counselor

## 2021-05-30 ENCOUNTER — Encounter: Payer: Self-pay | Admitting: *Deleted

## 2021-05-30 ENCOUNTER — Ambulatory Visit: Payer: Self-pay | Admitting: Genetic Counselor

## 2021-05-30 ENCOUNTER — Telehealth: Payer: Self-pay

## 2021-05-30 DIAGNOSIS — Z800 Family history of malignant neoplasm of digestive organs: Secondary | ICD-10-CM

## 2021-05-30 DIAGNOSIS — Z86010 Personal history of colonic polyps: Secondary | ICD-10-CM

## 2021-05-30 DIAGNOSIS — Z1379 Encounter for other screening for genetic and chromosomal anomalies: Secondary | ICD-10-CM

## 2021-05-30 DIAGNOSIS — C50112 Malignant neoplasm of central portion of left female breast: Secondary | ICD-10-CM

## 2021-05-30 DIAGNOSIS — C180 Malignant neoplasm of cecum: Secondary | ICD-10-CM

## 2021-05-30 LAB — SURGICAL PATHOLOGY

## 2021-05-30 NOTE — Telephone Encounter (Signed)
Patient called requesting a call from [facility] and would like an appt as well.

## 2021-05-31 ENCOUNTER — Ambulatory Visit: Payer: Medicare Other

## 2021-05-31 ENCOUNTER — Other Ambulatory Visit: Payer: Self-pay

## 2021-05-31 DIAGNOSIS — Z7901 Long term (current) use of anticoagulants: Secondary | ICD-10-CM | POA: Diagnosis not present

## 2021-05-31 LAB — POCT INR: INR: 1.7 — AB (ref 2.0–3.0)

## 2021-05-31 NOTE — Telephone Encounter (Signed)
noted 

## 2021-05-31 NOTE — Telephone Encounter (Signed)
INR today is 1.7. Pt report the apt in her chart with the surgeon on 9/30 was for a surgery f/u and not for another surgery for her hematoma. She has an apt today with the surgeon at [place] and she will contact this office after that to let the coumadin clinic know how the surgeon wants to proceed.

## 2021-05-31 NOTE — Patient Instructions (Addendum)
Pre visit review using our clinic review tool, if applicable. No additional management support is needed unless otherwise documented below in the visit note.  Follow current directions from surgeon and hold dose today and f/u with coumadin clinic tomorrow to advise of surgeons care plan for hematoma surgery.

## 2021-05-31 NOTE — Telephone Encounter (Signed)
Pt called to report she has had a hematoma on her breast since 9/24 and has not been able to contact her surgeon until yesterday. They advised her to stop her coumadin and she had already finished her lovenox so she has not taken anymore of it. She reports the surgeon said she will need another surgery tomorrow to address the hematoma. But it looks like she is actually scheduled for surgery on 9/30 and not tomorrow. Pt requested to come to the coumadin clinic today at [facility] to check INR. Made apt for today at 10am.

## 2021-05-31 NOTE — Telephone Encounter (Signed)
Patient called again to ask for a call from [facility]. I let her know she was not in office and to try again tomorrow       Good callback number is [phone]

## 2021-06-01 ENCOUNTER — Ambulatory Visit: Payer: Medicare Other

## 2021-06-01 ENCOUNTER — Encounter: Payer: Self-pay | Admitting: Internal Medicine

## 2021-06-01 ENCOUNTER — Telehealth: Payer: Self-pay | Admitting: Internal Medicine

## 2021-06-01 NOTE — Telephone Encounter (Signed)
Patient need to cancel appt. The phone hung up.

## 2021-06-01 NOTE — Telephone Encounter (Signed)
Pt reports she had OV with surgeon today that reported they do not want to bring her back to surgery. They drew fluid from the hematoma in the office. They advised for her to hold her coumadin today and tomorrow and restart her normal dose on Friday if ok with PCP and coumadin clinic.  Advised to follow surgeon's directions and a msg would be sent to PCP to make them aware. She wanted to thank PCP for all she has done and let her know she appreciates her very much. Advised a msg would be sent to let her know. Made a coumadin clinic apt for 10/4 and advised to continue normal dosing after Friday if ok with surgeon and let this office know if anything changes. Pt verbalized understanding.

## 2021-06-01 NOTE — Telephone Encounter (Signed)
° ° °  Please return call to patient °

## 2021-06-02 NOTE — Progress Notes (Signed)
Agree with management.  Mayzee Reichenbach J Kinaya Hilliker, MD  

## 2021-06-03 ENCOUNTER — Ambulatory Visit: Payer: Medicare Other | Admitting: Neurology

## 2021-06-06 ENCOUNTER — Ambulatory Visit (INDEPENDENT_AMBULATORY_CARE_PROVIDER_SITE_OTHER): Payer: Medicare Other

## 2021-06-06 DIAGNOSIS — Z950 Presence of cardiac pacemaker: Secondary | ICD-10-CM | POA: Diagnosis not present

## 2021-06-06 DIAGNOSIS — I5022 Chronic systolic (congestive) heart failure: Secondary | ICD-10-CM

## 2021-06-06 NOTE — Progress Notes (Signed)
EPIC Encounter for ICM Monitoring  Patient Name: Anita Duke is a 80 y.o. female Date: 06/06/2021 Primary Care Physican: Binnie Rail, MD Primary Cardiologist: Varanasi/McLean Electrophysiologist: Allred Bi-V Pacing:  96%         06/07/2021 Weight: 152 lbs        Battery Longevity: 8.7 months                          Spoke with patient and heart failure questions reviewed.  Pt reports fluid around the breast has been drained 3 times since 9/22 breast lumpectomy for breast cancer.   She will not have radiation due to it may damage her heart.  She has not had any fluid symptoms such as swelling of ankles/legs or shortness of breath.   Corvue Thoracic impedance suggesting possible fluid accumulation starting 9/24 and returned closse to baseline transmission date, 10/3.   Prescribed:  Jardiance 25 mg take 1 tablet daily Spironolactone 25 mg take 1 tablet daily   Labs: 04/28/2021 Creatinine 0.56, BUN 14, Potassium 3.4, Sodium 144, GFR >60 03/31/2021 Creatinine 0.68, BUN 19, Potassium 3.8, Sodium 142, GFR 81.79 03/22/2021 Creatinine 0.68, BUN 17, Potassium 3.8, Sodium 140, GFR >60 A complete set of results can be found in Results Review.   Recommendations:   Advised to continue to monitor for fluid symptoms and call if symptoms develop.   Follow-up plan: ICM clinic phone appointment on 06/14/2021 to recheck fluid levels.   91 day device clinic remote transmission 06/28/2021.   EP/Cardiology Office Visits:   07/11/2021 with Dr Rayann Heman.     Copy of ICM check sent to Dr. Rayann Heman.     3 month ICM trend: 06/06/2021.    1 Year ICM trend:       [name], RN 06/06/2021 1:16 PM

## 2021-06-07 ENCOUNTER — Ambulatory Visit: Payer: Medicare Other

## 2021-06-07 ENCOUNTER — Other Ambulatory Visit: Payer: Self-pay | Admitting: Internal Medicine

## 2021-06-07 ENCOUNTER — Other Ambulatory Visit: Payer: Self-pay

## 2021-06-07 DIAGNOSIS — I4891 Unspecified atrial fibrillation: Secondary | ICD-10-CM | POA: Diagnosis not present

## 2021-06-07 DIAGNOSIS — Z7901 Long term (current) use of anticoagulants: Secondary | ICD-10-CM

## 2021-06-07 LAB — POCT INR: INR: 1.1 — AB (ref 2.0–3.0)

## 2021-06-07 NOTE — Patient Instructions (Addendum)
Pre visit review using our clinic review tool, if applicable. No additional management support is needed unless otherwise documented below in the visit note.  Take 1 1/2 tablets Tues, Wed, Thurs, and Fri and then resume normal dosing of 1 tablet daily until recheck on 06/14/21.

## 2021-06-07 NOTE — Progress Notes (Signed)
Agree with management.  Anita Thaker J Shaquinta Peruski, MD  

## 2021-06-08 ENCOUNTER — Encounter: Payer: Self-pay | Admitting: Internal Medicine

## 2021-06-08 NOTE — Telephone Encounter (Signed)
Pt has been compliant with coumadin management and provider apts.  Sent in refill

## 2021-06-09 NOTE — Telephone Encounter (Signed)
Contacted patient in attempt to discuss results of genetic testing in greater detail.  LVM with contact information requesting a call back.

## 2021-06-10 ENCOUNTER — Other Ambulatory Visit: Payer: Self-pay | Admitting: Internal Medicine

## 2021-06-13 ENCOUNTER — Encounter (HOSPITAL_COMMUNITY): Payer: Self-pay

## 2021-06-14 ENCOUNTER — Ambulatory Visit: Payer: Medicare Other

## 2021-06-14 ENCOUNTER — Other Ambulatory Visit: Payer: Self-pay

## 2021-06-14 ENCOUNTER — Ambulatory Visit (INDEPENDENT_AMBULATORY_CARE_PROVIDER_SITE_OTHER): Payer: Medicare Other

## 2021-06-14 DIAGNOSIS — I4891 Unspecified atrial fibrillation: Secondary | ICD-10-CM | POA: Diagnosis not present

## 2021-06-14 DIAGNOSIS — I5022 Chronic systolic (congestive) heart failure: Secondary | ICD-10-CM

## 2021-06-14 DIAGNOSIS — Z950 Presence of cardiac pacemaker: Secondary | ICD-10-CM

## 2021-06-14 DIAGNOSIS — Z7901 Long term (current) use of anticoagulants: Secondary | ICD-10-CM

## 2021-06-14 LAB — POCT INR: INR: 2 (ref 2.0–3.0)

## 2021-06-14 NOTE — Patient Instructions (Addendum)
Pre visit review using our clinic review tool, if applicable. No additional management support is needed unless otherwise documented below in the visit note.  Continue taking 1 tablet daily. Recheck in 4 wks.

## 2021-06-15 NOTE — Progress Notes (Signed)
EPIC Encounter for ICM Monitoring  Patient Name: Anita Duke is a 80 y.o. female Date: 06/15/2021 Primary Care Physican: Binnie Rail, MD Primary Cardiologist: Varanasi/McLean Electrophysiologist: Allred Bi-V Pacing:  96%         06/07/2021 Weight: 152 lbs         Battery Longevity: 8.8 months                          Spoke with patient and heart failure questions reviewed.  Pt asymptomatic for fluid accumulation and feeling well.   Corvue Thoracic impedance suggesting fluid levels returned to normal.   Prescribed:  Jardiance 25 mg take 1 tablet daily Spironolactone 25 mg take 1 tablet daily   Labs: 04/28/2021 Creatinine 0.56, BUN 14, Potassium 3.4, Sodium 144, GFR >60 03/31/2021 Creatinine 0.68, BUN 19, Potassium 3.8, Sodium 142, GFR 81.79 03/22/2021 Creatinine 0.68, BUN 17, Potassium 3.8, Sodium 140, GFR >60 A complete set of results can be found in Results Review.   Recommendations:   No changes and encouraged to call if experiencing any fluid symptoms.   Follow-up plan: ICM clinic phone appointment on 07/11/2021.   91 day device clinic remote transmission 06/28/2021.   EP/Cardiology Office Visits:   07/11/2021 with Dr Rayann Heman.     Copy of ICM check sent to Dr. Rayann Heman.      3 month ICM trend: 06/14/2021.    1 Year ICM trend:       [name], RN 06/15/2021 1:14 PM

## 2021-06-16 NOTE — Progress Notes (Signed)
Agree with management.  Shavar Gorka J Jakwan Sally, MD  

## 2021-06-20 ENCOUNTER — Encounter: Payer: Medicare Other | Admitting: Internal Medicine

## 2021-06-22 ENCOUNTER — Other Ambulatory Visit (HOSPITAL_COMMUNITY): Payer: Medicare Other

## 2021-06-24 ENCOUNTER — Encounter: Payer: Medicare Other | Admitting: Internal Medicine

## 2021-06-27 ENCOUNTER — Other Ambulatory Visit: Payer: Medicare Other

## 2021-06-27 ENCOUNTER — Encounter: Payer: Self-pay | Admitting: *Deleted

## 2021-06-27 ENCOUNTER — Ambulatory Visit: Payer: Medicare Other | Admitting: Hematology

## 2021-06-28 ENCOUNTER — Ambulatory Visit (INDEPENDENT_AMBULATORY_CARE_PROVIDER_SITE_OTHER): Payer: Medicare Other

## 2021-06-28 DIAGNOSIS — I495 Sick sinus syndrome: Secondary | ICD-10-CM | POA: Diagnosis not present

## 2021-06-28 LAB — CUP PACEART REMOTE DEVICE CHECK
Battery Remaining Longevity: 9 mo
Battery Remaining Percentage: 9 %
Battery Voltage: 2.84 V
Date Time Interrogation Session: 20221025020014
Implantable Lead Implant Date: 20130913
Implantable Lead Implant Date: 20130913
Implantable Lead Implant Date: 20150303
Implantable Lead Location: 753858
Implantable Lead Location: 753859
Implantable Lead Location: 753860
Implantable Lead Model: 5076
Implantable Lead Model: 5092
Implantable Pulse Generator Implant Date: 20150303
Lead Channel Impedance Value: 380 Ohm
Lead Channel Impedance Value: 410 Ohm
Lead Channel Pacing Threshold Amplitude: 0.75 V
Lead Channel Pacing Threshold Amplitude: 0.875 V
Lead Channel Pacing Threshold Pulse Width: 0.4 ms
Lead Channel Pacing Threshold Pulse Width: 0.6 ms
Lead Channel Sensing Intrinsic Amplitude: 8.6 mV
Lead Channel Setting Pacing Amplitude: 2 V
Lead Channel Setting Pacing Amplitude: 2 V
Lead Channel Setting Pacing Pulse Width: 0.4 ms
Lead Channel Setting Pacing Pulse Width: 0.6 ms
Lead Channel Setting Sensing Sensitivity: 3 mV
Pulse Gen Model: 3242
Pulse Gen Serial Number: 7548835

## 2021-06-29 ENCOUNTER — Other Ambulatory Visit: Payer: Self-pay

## 2021-06-29 ENCOUNTER — Other Ambulatory Visit (HOSPITAL_COMMUNITY): Payer: Self-pay | Admitting: Cardiology

## 2021-06-29 ENCOUNTER — Ambulatory Visit (HOSPITAL_COMMUNITY)
Admission: RE | Admit: 2021-06-29 | Discharge: 2021-06-29 | Disposition: A | Discharge status: Home / Self Care | Payer: Medicare Other | Source: Ambulatory Visit | Attending: Internal Medicine | Admitting: Internal Medicine

## 2021-06-29 DIAGNOSIS — I519 Heart disease, unspecified: Secondary | ICD-10-CM | POA: Diagnosis not present

## 2021-06-29 LAB — BASIC METABOLIC PANEL
Anion gap: 8 (ref 5–15)
BUN: 13 mg/dL (ref 8–23)
CO2: 28 mmol/L (ref 22–32)
Calcium: 9.3 mg/dL (ref 8.9–10.3)
Chloride: 102 mmol/L (ref 98–111)
Creatinine, Ser: 0.67 mg/dL (ref 0.44–1.00)
GFR, Estimated: >60 mL/min (ref >60)
Glucose, Bld: 185 mg/dL — ABNORMAL HIGH (ref 70–99)
Potassium: 3.9 mmol/L (ref 3.5–5.1)
Sodium: 138 mmol/L (ref 135–145)

## 2021-06-29 NOTE — Progress Notes (Signed)
Bmet order placed °

## 2021-06-30 ENCOUNTER — Encounter: Payer: Self-pay | Admitting: Hematology

## 2021-06-30 ENCOUNTER — Other Ambulatory Visit (HOSPITAL_COMMUNITY): Payer: Self-pay

## 2021-06-30 ENCOUNTER — Inpatient Hospital Stay: Payer: Medicare Other | Attending: Hematology

## 2021-06-30 ENCOUNTER — Inpatient Hospital Stay: Payer: Medicare Other | Admitting: Hematology

## 2021-06-30 VITALS — BP 141/58 | HR 71 | Temp 98.1°F | Resp 18 | Ht 62.0 in | Wt 148.8 lb

## 2021-06-30 DIAGNOSIS — Z85038 Personal history of other malignant neoplasm of large intestine: Secondary | ICD-10-CM | POA: Insufficient documentation

## 2021-06-30 DIAGNOSIS — M8580 Other specified disorders of bone density and structure, unspecified site: Secondary | ICD-10-CM | POA: Insufficient documentation

## 2021-06-30 DIAGNOSIS — C50112 Malignant neoplasm of central portion of left female breast: Secondary | ICD-10-CM | POA: Insufficient documentation

## 2021-06-30 DIAGNOSIS — Z170 Estrogen receptor positive status [ER+]: Secondary | ICD-10-CM | POA: Insufficient documentation

## 2021-06-30 LAB — CMP (CANCER CENTER ONLY)
ALT: 12 U/L (ref 0–44)
AST: 14 U/L — ABNORMAL LOW (ref 15–41)
Albumin: 3.9 g/dL (ref 3.5–5.0)
Alkaline Phosphatase: 87 U/L (ref 38–126)
Anion gap: 10 (ref 5–15)
BUN: 13 mg/dL (ref 8–23)
CO2: 26 mmol/L (ref 22–32)
Calcium: 9.4 mg/dL (ref 8.9–10.3)
Chloride: 106 mmol/L (ref 98–111)
Creatinine: 0.71 mg/dL (ref 0.44–1.00)
GFR, Estimated: >60 mL/min (ref >60)
Glucose, Bld: 135 mg/dL — ABNORMAL HIGH (ref 70–99)
Potassium: 3.6 mmol/L (ref 3.5–5.1)
Sodium: 142 mmol/L (ref 135–145)
Total Bilirubin: 1.1 mg/dL (ref 0.3–1.2)
Total Protein: 6.9 g/dL (ref 6.5–8.1)

## 2021-06-30 MED ORDER — TAMOXIFEN CITRATE 20 MG PO TABS
20.0000 mg | ORAL_TABLET | Freq: Every day | ORAL | 3 refills | Status: DC
  Filled 2021-06-30: qty 30, 30d supply, fill #0
  Filled 2021-09-27: qty 30, 30d supply, fill #1
  Filled 2021-11-15: qty 30, 30d supply, fill #2
  Filled 2021-12-14: qty 30, 30d supply, fill #3

## 2021-06-30 NOTE — Progress Notes (Signed)
Anita Duke   [phone] [phone]   Clinic Follow up Note   Patient Care Team: Binnie Rail, MD as PCP - General (Internal Medicine) Larey Dresser, MD as PCP - Cardiology (Cardiology) Carlena Bjornstad, MD as Consulting Physician (Cardiology) Inda Castle, MD (Inactive) as Consulting Physician (Gastroenterology) Cameron Sprang, MD as Consulting Physician (Neurology) Truitt Merle, MD as Consulting Physician (Hematology) Stark Klein, MD as Consulting Physician (General Surgery) Mauro Kaufmann, RN as Oncology Nurse Navigator Rockwell Germany, RN as Oncology Nurse Navigator  Date of Service:  06/30/2021  CHIEF COMPLAINT: f/u of left breast cancer  CURRENT THERAPY:  Pending adjuvant Tamoxifen  ASSESSMENT & PLAN:  Anita Duke is a 80 y.o. female with   1. 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  ER and PR strongly positive, with low Ki-67. 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 Otherwise, no mutations. -she underwent lumpectomy on 05/26/21 with Dr. Barry Dienes showing: IDC, 1.5 cm, grade 2; DCIS, intermediate grade; resection margins negative. -we reviewed tamoxifen again today. She notes she was previously on Welbutrin but has weaned off of it since our initial discussion. -Despite Dr. Barry Dienes giving the okay to proceed with antiestrogens, I am comfortable waiting to start tamoxifen until after Christmas to allow her breast wound to heal.   2. Osteopenia -Her most recent DEXA was 03/31/21 showing -1.4. -We discussed tamoxifen can strengthen the bones.   3. H/o Cecal Adenocarcinoma, stage I p(T2, N0) -found on screening colonoscopy -s/p partial colectomy on 12/03/12 by Dr. Barry Dienes -evaluated by Dr. Jana Hakim, no further  treatment needed.     PLAN:  -start tamoxifen after Christmas. I called in today  -survivorship in 3 months -lab and f/u in 6 months   No problem-specific Assessment & Plan notes found for this encounter.   SUMMARY OF ONCOLOGIC HISTORY: Oncology History Overview Note  Cancer Staging Malignant neoplasm of central portion of left breast [facility]) Staging form: Breast, AJCC 8th Edition - Clinical stage from 04/11/2021: Stage IA (cT1c, cN0, cM0, G2, ER+, PR+, HER2-) - Signed by Truitt Merle, MD on 04/27/2021    Malignant neoplasm of central portion of left breast ([place])  04/09/2021 Mammogram   Bilateral Diagnostic, and Left Breast US  FINDINGS: Spiculated mass in the left breast containing calcifications at 3 o'clock, 6 cm from the nipple measuring 11 x 9 x 9 mm. No axillary adenopathy.     04/11/2021 Cancer Staging   Staging form: Breast, AJCC 8th Edition - Clinical stage from 04/11/2021: Stage IA (cT1c, cN0, cM0, G2, ER+, PR+, HER2-) - Signed by Truitt Merle, MD on 04/27/2021 Stage prefix: Initial diagnosis Histologic grading system: 3 grade system    04/11/2021 Pathology Results   Diagnosis Breast, left, needle core biopsy, 3:00 - INVASIVE DUCTAL CARCINOMA, SEE COMMENT. - DUCTAL CARCINOMA IN SITU. Microscopic Comment The carcinoma appears grade 2.  PROGNOSTIC INDICATORS Results: IMMUNOHISTOCHEMICAL AND MORPHOMETRIC ANALYSIS PERFORMED MANUALLY The tumor cells are NEGATIVE for Her2 (1+). Estrogen Receptor: 100%, POSITIVE, STRONG STAINING INTENSITY Progesterone Receptor: 100%, POSITIVE, STRONG STAINING INTENSITY Proliferation Marker Ki67: 5%   04/27/2021 Initial Diagnosis   Malignant neoplasm of central portion of left breast ([place])   05/17/2021 Genetic Testing   Hereditary cancer genetic testing: carrier result.  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  No other pathogenic variants  detected in [place] CancerNext-Expanded +RNAinsight Panel. The report date is May 17, 2021.    The CancerNext-Expanded gene panel offered by [facility] and includes sequencing, rearrangement, and RNA analysis for the following 77 genes: AIP, ALK, APC, ATM, AXIN2, BAP1, BARD1, BLM, BMPR1A, BRCA1, BRCA2, BRIP1, CDC73, CDH1, CDK4, CDKN1B, CDKN2A, CHEK2, CTNNA1, DICER1, FANCC, FH, FLCN, GALNT12, KIF1B, LZTR1, MAX, MEN1, MET, MLH1, MSH2, MSH3, MSH6, MUTYH, NBN, NF1, NF2, NTHL1, PALB2, PHOX2B, PMS2, POT1, PRKAR1A, PTCH1, PTEN, RAD51C, RAD51D, RB1, RECQL, RET, SDHA, SDHAF2, SDHB, SDHC, SDHD, SMAD4, SMARCA4, SMARCB1, SMARCE1, STK11, SUFU, TMEM127, TP53, TSC1, TSC2, VHL and XRCC2 (sequencing and deletion/duplication); EGFR, EGLN1, HOXB13, KIT, MITF, PDGFRA, POLD1, and POLE (sequencing only); EPCAM and GREM1 (deletion/duplication only).    05/26/2021 Definitive Surgery   FINAL MICROSCOPIC DIAGNOSIS:   A. BREAST, LEFT, LUMPECTOMY:  - Invasive ductal carcinoma, 1.5 cm, grade 2  - Ductal carcinoma in situ, intermediate grade  - Resection margins are negative for carcinoma; closest is the posterior margin at 0.3 cm  - Patchy atypical ductal hyperplasia  - Fibrocystic change with usual ductal hyperplasia, apocrine metaplasia and calcifications  - Biopsy site changes  - See oncology table       INTERVAL HISTORY:  Anita Duke is here for a follow up of breast cancer. She was last seen by me on 04/28/21. She presents to the clinic alone. She reports she is having drainage from her surgical site due to being on anticoagulants.   All other systems were reviewed with the patient and are negative.  MEDICAL HISTORY:  Past Medical History:  Diagnosis Date   Acquired complete AV block    AV node ablation May 28, 2012    Allergy    mild- uses claritin    Anemia    Hemoglobin 10.4, December, 2013   Arthritis    "not bad; little in my hands; some in my knees" (11/04/2013)   B12 deficiency  03/16/2020   Bilateral sensorineural hearing loss 03/29/2017   Breast cancer ([place])    CAD (coronary artery disease)    LIMA to the LAD at time of mitral valve repair / LIMA atretic,, February, 2011   Cataracts, bilateral    removed bilat    Cecal cancer 2014   colon   CHF (congestive heart failure)    Chronic systolic dysfunction of left ventricle 05/21/2013   Clotting disorder    21 yrs ago blood clot in heart    Colon cancer ([place])    Colon polyp, hyperplastic    Diabetes mellitus type II, controlled 03/02/2009   Diabetic neuropathy 02/21/2018   [facility] [place]   Diverticulosis of colon    Diverticulosis of large intestine 08/04/2002   Ejection fraction < 50%    EF 30%, New diagnosis   february, 2011, etiology not clear, consider rate related tachycardia, and not use carvedilol, catheterization no constriction, /    EF 55-60% echo, June, 2011 /     Beta blocker stopped May, 2012 with reactive airway disease.  Patient does not tolerate metoprolol.  Carvedilol stopped  /   EF 55%, echo, February, 2012  //   EF 40-45%, septal dyssynergy, hypokinesis of the a   Essential hypertension 07/07/2008   Family history of adverse reaction to anesthesia    sister had a hard time waking up after anesthesia   Family history of colon  cancer 04/29/2021   GERD (gastroesophageal reflux disease)    Hemorrhoids, internal    High cholesterol    History of CABG    Hyperlipidemia 07/07/2008   Lumbar radiculopathy, acute 06/17/2018   Major depressive disorder 07/19/2018   Mitral regurgitation    a. s/p repair with LAA ligation and MAZE at time of surgery   Mitral stenosis    Mild, February, 2011, post mitral valve repair / mild, echo, June, 2011  //  Mild functional mitral stenosis, echo, February, 2012  //   mild stenosis, echo, December, 2013  //  mild, echo, August, 2014   //   Mild, echo, April, 2016    Osteopenia    left hip    Pacemaker-Medtronic 05/20/2012   Pacemaker placed  September, 2013   //   upgraded to biventricular CRT pacemaker November 04, 2013    Permanent atrial fibrillation    a. s/p MAZE b. s/p AVN ablation   Personal history of colonic polyps 04/29/2021   PONV (postoperative nausea and vomiting)    Pulmonary HTN    QT prolongation    Tikosyn and Effexor. QT prolonged October 13, 2011, peak is in dose reduced from 500  to -250 twice a day   Right ventricular dysfunction    Mild to moderate, echo, February, 2011 / normalized echo, June, 2011 /  RV normal, echo, February, 2012  //   right ventricle reported as good echo, December, 2013    S/P mitral valve repair    [facility] / Maze procedure/ atrial appendage removed were tied off    Sick sinus syndrome    s/p Medtronic dual chamber PPM 05/17/12    Sleep difficulties 09/13/2019   Vitamin D deficiency 02/20/2014    SURGICAL HISTORY: Past Surgical History:  Procedure Laterality Date   ABDOMINAL HYSTERECTOMY  ?2002   AV NODE ABLATION N/A 05/28/2012   Procedure: AV NODE ABLATION;  Surgeon: Thompson Grayer, MD;  Location[facility] CATH LAB;  Service: Cardiovascular;  Laterality: N/A;   BI-VENTRICULAR PACEMAKER UPGRADE N/A 11/04/2013   upgrade of previously implanted dual chamber pacemaker to Violet by Dr Rayann Heman   BREAST EXCISIONAL BIOPSY Left over 10 years ago   benign   BREAST LUMPECTOMY Left 05/26/2021   Procedure: LEFT BREAST LUMPECTOMY;  Surgeon: Stark Klein, MD;  Location: [place];  Service: General;  Laterality: Left;   BREAST SURGERY     CARDIOVERSION  02/08/2012   Procedure: CARDIOVERSION;  Surgeon: Carlena Bjornstad, MD;  Location: [place];  Service: Cardiovascular;  Laterality: N/A;   CARDIOVERSION  02/29/2012   Procedure: CARDIOVERSION;  Surgeon: Carlena Bjornstad, MD;  Location: [place];  Service: Cardiovascular;  Laterality: N/A;   CARDIOVERSION  05/15/2012   Procedure: CARDIOVERSION;  Surgeon: Carlena Bjornstad, MD;  Location: [place];  Service: Cardiovascular;  Laterality: N/A;   CATARACT  EXTRACTION, BILATERAL  2017   [place]   COLON SURGERY  2014   right hemicolectomy    COLONOSCOPY     CORONARY ARTERY BYPASS GRAFT  2001   CABG X1 "at time of mitral valve repair" (12/04/2012   DIAGNOSTIC LAPAROSCOPIC LIVER BIOPSY Left 12/03/2012   Procedure: DIAGNOSTIC LAPAROSCOPIC LIVER BIOPSY;  Surgeon: Stark Klein, MD;  Location: [place];  Service: General;  Laterality: Left;   EYE SURGERY     bilateral cataract surgery   LAPAROSCOPIC RIGHT HEMI COLECTOMY  12/03/2012   Procedure: LAPAROSCOPIC RIGHT HEMI COLECTOMY;  Surgeon: Stark Klein, MD;  Location: [facility] OR;  Service: General;;   LUMBAR LAMINECTOMY/ DECOMPRESSION WITH MET-RX Left 06/17/2018   Procedure: left Lumbar three-fourextraforaminal Microdiscectomy with Met-Rx;  Surgeon: Kristeen Miss, MD;  Location: [place];  Service: Neurosurgery;  Laterality: Left;   MAZE  2001   w/ MVR & CABG   MITRAL VALVE REPAIR  2001   "anterior and posterior leaflets" (12/04/2012)   PERMANENT PACEMAKER INSERTION N/A 05/17/2012   MDT Adapta L implanted by Dr Rayann Heman for tachy/brady syndrome   POLYPECTOMY     TONSILLECTOMY AND ADENOIDECTOMY  ~ 2951   UMBILICAL HERNIA REPAIR N/A 12/03/2012   Procedure: HERNIA REPAIR UMBILICAL ADULT;  Surgeon: Stark Klein, MD;  Location: [place];  Service: General;  Laterality: N/A;    I have reviewed the social history and family history with the patient and they are unchanged from previous note.  ALLERGIES:  is allergic to demerol [meperidine], morphine, januvia [sitagliptin], metformin and related, and tetanus toxoid.  MEDICATIONS:  Current Outpatient Medications  Medication Sig Dispense Refill   tamoxifen (NOLVADEX) 20 MG tablet Take 1 tablet (20 mg total) by mouth daily. 30 tablet 3   ACCU-CHEK SOFTCLIX LANCETS lancets USE TO CHECK BLOOD SUGARS  TWICE A DAY 200 each 2   acetaminophen (TYLENOL) 500 MG tablet Take 500 mg by mouth at bedtime.     amoxicillin (AMOXIL) 500 MG capsule TAKE 4 CAPSULES PRIOR TO DENTAL  APPOINTMENTS AS DIRECTED 40 capsule 0   atorvastatin (LIPITOR) 40 MG tablet TAKE 1 TABLET BY MOUTH  DAILY 90 tablet 3   Blood Glucose Monitoring Suppl (ACCU-CHEK AVIVA PLUS) w/Device KIT Use to check blood sugars daily Dx E11.9 1 kit 0   carvedilol (COREG) 12.5 MG tablet TAKE 1 TABLET BY MOUTH  TWICE DAILY WITH A MEAL 180 tablet 3   cholecalciferol (VITAMIN D) 1000 UNITS tablet Take 1,000 Units by mouth daily.      fish oil-omega-3 fatty acids 1000 MG capsule Take 1 g by mouth 2 (two) times daily.     glucose blood (ACCU-CHEK AVIVA PLUS) test strip USE 2 TIMES DAILY AS  DIRECTED 200 each 2   JARDIANCE 25 MG TABS tablet TAKE 1 TABLET BY MOUTH  DAILY 90 tablet 3   loratadine (CLARITIN) 10 MG tablet Take 10 mg by mouth daily.     Polyethyl Glycol-Propyl Glycol (SYSTANE) 0.4-0.3 % SOLN Place 1 drop into both eyes every 6 (six) hours as needed (dry eyes).     sodium chloride (OCEAN) 0.65 % SOLN nasal spray Place 1 spray into both nostrils 2 (two) times daily as needed for congestion.     spironolactone (ALDACTONE) 25 MG tablet TAKE 1 TABLET BY MOUTH  DAILY 90 tablet 3   trandolapril (MAVIK) 4 MG tablet TAKE 1 TABLET BY MOUTH  TWICE DAILY FOR BLOOD  PRESSURE 180 tablet 1   warfarin (COUMADIN) 2.5 MG tablet TAKE 1 TABLET BY MOUTH DAILY OR AS DIRECTED BY ANTICOAGULATION  CLINIC 104 tablet 3   No current facility-administered medications for this visit.    PHYSICAL EXAMINATION: ECOG PERFORMANCE STATUS: 1 - Symptomatic but completely ambulatory  Vitals:   06/30/21 1323  BP: (!) 141/58  Pulse: 71  Resp: 18  Temp: 98.1 F (36.7 C)  SpO2: 98%   Wt Readings from Last 3 Encounters:  06/30/21 67.5 kg  05/26/21 68 kg  05/19/21 68 kg     GENERAL:alert, no distress and comfortable SKIN: skin color normal, no rashes or significant lesions EYES: normal, Conjunctiva are pink  and non-injected, sclera clear  NEURO: alert & oriented x 3 with fluent speech BREAST: left breast incision is open with  yellownish discharge, no skin erythema    LABORATORY DATA:  I have reviewed the data as listed CBC Latest Ref Rng & Units 05/19/2021 03/31/2021 03/22/2021  WBC 4.0 - 10.5 K/uL 6.4 6.3 7.4  Hemoglobin 12.0 - 15.0 g/dL 14.1 13.5 14.6  Hematocrit 36.0 - 46.0 % 44.5 40.4 44.7  Platelets 150 - 400 K/uL 193 187.0 187     CMP Latest Ref Rng & Units 06/30/2021 06/29/2021 04/28/2021  Glucose 70 - 99 mg/dL 135(H) 185(H) 152(H)  BUN 8 - 23 mg/dL _0 Creatinine 0.44 - 1.00 mg/dL 0.71 0.67 0.56  Sodium 135 - 145 mmol/L 142 138 144  Potassium 3.5 - 5.1 mmol/L 3.6 3.9 3.4(L)  Chloride 98 - 111 mmol/L 106 102 108  CO2 22 - 32 mmol/L _1 Calcium 8.9 - 10.3 mg/dL 9.4 9.3 9.7  Total Protein 6.5 - 8.1 g/dL 6.9 - 7.3  Total Bilirubin 0.3 - 1.2 mg/dL 1.1 - 1.0  Alkaline Phos 38 - 126 U/L 87 - 70  AST 15 - 41 U/L 14(L) - 20  ALT 0 - 44 U/L 12 - 15      RADIOGRAPHIC STUDIES: I have personally reviewed the radiological images as listed and agreed with the findings in the report. No results found.    No orders of the defined types were placed in this encounter.  All questions were answered. The patient knows to call the clinic with any problems, questions or concerns. No barriers to learning was detected. The total time spent in the appointment was 30 minutes.     Truitt Merle, MD 06/30/2021   I, Wilburn Mylar, am acting as scribe for Truitt Merle, MD.   I have reviewed the above documentation for accuracy and completeness, and I agree with the above.

## 2021-07-01 ENCOUNTER — Encounter: Payer: Self-pay | Admitting: Hematology

## 2021-07-04 ENCOUNTER — Encounter: Payer: Self-pay | Admitting: *Deleted

## 2021-07-04 ENCOUNTER — Other Ambulatory Visit (HOSPITAL_COMMUNITY): Payer: Self-pay

## 2021-07-05 ENCOUNTER — Telehealth: Payer: Self-pay | Admitting: Hematology

## 2021-07-05 NOTE — Telephone Encounter (Signed)
Scheduled follow-up appointments per 10/27 los. Patient is aware. 

## 2021-07-06 LAB — HM DIABETES EYE EXAM

## 2021-07-06 NOTE — Progress Notes (Signed)
Remote pacemaker transmission.   

## 2021-07-06 NOTE — Progress Notes (Signed)
  Anita Duke tested positive for a single pathogenic variant in the [place] gene through the [place] +RNAinsight Panel. Specifically, this variant is [id number].   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   The CancerNext-Expanded gene panel offered by [facility] and includes sequencing, rearrangement, and RNA analysis for the following 77 genes: AIP, ALK, APC, ATM, AXIN2, BAP1, BARD1, BLM, BMPR1A, BRCA1, BRCA2, BRIP1, CDC73, CDH1, CDK4, CDKN1B, CDKN2A, CHEK2, CTNNA1, DICER1, FANCC, FH, FLCN, GALNT12, KIF1B, LZTR1, MAX, MEN1, MET, MLH1, MSH2, MSH3, MSH6, MUTYH, NBN, NF1, NF2, NTHL1, PALB2, PHOX2B, PMS2, POT1, PRKAR1A, PTCH1, PTEN, RAD51C, RAD51D, RB1, RECQL, RET, SDHA, SDHAF2, SDHB, SDHC, SDHD, SMAD4, SMARCA4, SMARCB1, SMARCE1, STK11, SUFU, TMEM127, TP53, TSC1, TSC2, VHL and XRCC2 (sequencing and deletion/duplication); EGFR, EGLN1, HOXB13, KIT, MITF, PDGFRA, POLD1, and POLE (sequencing only); EPCAM and GREM1 (deletion/duplication only). The test report has been scanned into EPIC and is located under the Molecular Pathology section of the Results Review tab.  A portion of the result report is included below for reference. Genetic testing reported out on May 17, 2021.     FH Gene: The FH gene is associated with Hereditary Leiomyomatosis and Renal Cell Cancer (HLRCC). HLRCC is a rare, adult-onset tumor-predisposition syndrome that is characterized by cutaneous leiomyomas, uterine leiomyomas, and renal tumors.  However, Anita Duke particular likely pathogenic variant in the FH gene ([id number]) is not expected to confer risk for autosomal dominant HLRCC. 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 This result does not impact her screening  recommendations at this time.    Implications for Family Members: Pathogenic variants in the [place] gene that are associated with HLRCC have autosomal dominant inheritance. This means that an individual with a pathogenic variant has a 50% chance of passing the condition on to their offspring. 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 FH deficiency is an inborn error of metabolism that is characterized by rapidly progressive neurologic impairment, including hypotonia, seizures, and cerebral atrophy. Most survive only for a few months after birth. For there to be a risk to offspring, both the patient and their partner would have to have a single pathogenic variant in the [place] gene; in such a case, the risk to offspring is 25%.   Anita Duke first degree relatives are at 50% risk for also having inherited the pathogenic variant in [place] and being carriers of FH deficiency. Therefore, single site testing may be considered for all at risk relatives. They may contact our office at [phone] for more information or to schedule an appointment. Complimentary testing for the familial variant is available for 90 days. Family members who live outside of the area are encouraged to find a genetic counselor in their area by visiting: [email or web address].    Additional Information: As we have not identified the cause of the cancer in the family, it is vital that Anita Duke and her family members continue to screen for cancer based on recommendations from their physicians. We encourage Anita Duke to have in depth discussions with her healthcare providers regarding appropriate cancer screenings.

## 2021-07-11 ENCOUNTER — Ambulatory Visit (INDEPENDENT_AMBULATORY_CARE_PROVIDER_SITE_OTHER): Payer: Medicare Other

## 2021-07-11 ENCOUNTER — Encounter: Payer: Self-pay | Admitting: Internal Medicine

## 2021-07-11 ENCOUNTER — Ambulatory Visit: Payer: Medicare Other | Admitting: Internal Medicine

## 2021-07-11 ENCOUNTER — Other Ambulatory Visit: Payer: Self-pay

## 2021-07-11 VITALS — BP 140/82 | HR 72 | Ht 62.0 in | Wt 150.0 lb

## 2021-07-11 DIAGNOSIS — I442 Atrioventricular block, complete: Secondary | ICD-10-CM

## 2021-07-11 DIAGNOSIS — I4821 Permanent atrial fibrillation: Secondary | ICD-10-CM | POA: Diagnosis not present

## 2021-07-11 DIAGNOSIS — I5022 Chronic systolic (congestive) heart failure: Secondary | ICD-10-CM | POA: Diagnosis not present

## 2021-07-11 DIAGNOSIS — Z950 Presence of cardiac pacemaker: Secondary | ICD-10-CM

## 2021-07-11 DIAGNOSIS — I10 Essential (primary) hypertension: Secondary | ICD-10-CM | POA: Diagnosis not present

## 2021-07-11 DIAGNOSIS — I2510 Atherosclerotic heart disease of native coronary artery without angina pectoris: Secondary | ICD-10-CM

## 2021-07-11 LAB — CUP PACEART INCLINIC DEVICE CHECK
Battery Remaining Longevity: 7 mo
Battery Voltage: 2.83 V
Brady Statistic RA Percent Paced: 0 %
Brady Statistic RV Percent Paced: 96 %
Date Time Interrogation Session: 20221107163022
Implantable Lead Implant Date: 20130913
Implantable Lead Implant Date: 20130913
Implantable Lead Implant Date: 20150303
Implantable Lead Location: 753858
Implantable Lead Location: 753859
Implantable Lead Location: 753860
Implantable Lead Model: 5076
Implantable Lead Model: 5092
Implantable Pulse Generator Implant Date: 20150303
Lead Channel Impedance Value: 375 Ohm
Lead Channel Impedance Value: 412.5 Ohm
Lead Channel Pacing Threshold Amplitude: 1 V
Lead Channel Pacing Threshold Amplitude: 1 V
Lead Channel Pacing Threshold Pulse Width: 0.4 ms
Lead Channel Pacing Threshold Pulse Width: 0.6 ms
Lead Channel Sensing Intrinsic Amplitude: 7.2 mV
Lead Channel Setting Pacing Amplitude: 2 V
Lead Channel Setting Pacing Amplitude: 2 V
Lead Channel Setting Pacing Pulse Width: 0.4 ms
Lead Channel Setting Pacing Pulse Width: 0.6 ms
Lead Channel Setting Sensing Sensitivity: 3 mV
Pulse Gen Model: 3242
Pulse Gen Serial Number: 7548835

## 2021-07-11 NOTE — Progress Notes (Signed)
PCP: Binnie Rail, MD Primary Cardiologist: Dr Aundra Dubin Primary EP:  Dr Meryl Crutch Anita Duke is a 80 y.o. female who presents today for routine electrophysiology followup.  Since last being seen in our clinic, the patient reports doing reasonably well.  She was recently diagnosed with breast cancer and underwent lumpectomy.  This was complicated by hematoma and subsequent infection.  She is healing now from secondary intention.  She will likely start hormonal therapy in January.  She does not plan to receive XRT at this point.  Today, she denies symptoms of palpitations, chest pain, shortness of breath,  lower extremity edema, dizziness, presyncope, or syncope.  The patient is otherwise without complaint today.   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   Breast cancer ([place])    CAD (coronary artery disease)    LIMA to the LAD at time of mitral valve repair / LIMA atretic,, February, 2011   Cataracts, bilateral    removed bilat    Cecal cancer 2014   colon   CHF (congestive heart failure)    Chronic systolic dysfunction of left ventricle 05/21/2013   Clotting disorder    21 yrs ago blood clot in heart    Colon cancer ([place])    Colon polyp, hyperplastic    Diabetes mellitus type II, controlled 03/02/2009   Diabetic neuropathy 02/21/2018   [facility] - [facility]   Diverticulosis of colon    Diverticulosis of large intestine 08/04/2002   Ejection fraction < 50%    EF 30%, New diagnosis   february, 2011, etiology not clear, consider rate related tachycardia, and not use carvedilol, catheterization no constriction, /    EF 55-60% echo, June, 2011 /     Beta blocker stopped  May, 2012 with reactive airway disease.  Patient does not tolerate metoprolol.  Carvedilol stopped  /   EF 55%, echo, February, 2012  //   EF 40-45%, septal dyssynergy, hypokinesis of the a   Essential hypertension 07/07/2008   Family history of adverse reaction to anesthesia    sister had a hard time waking up after anesthesia   Family history of colon cancer 04/29/2021   GERD (gastroesophageal reflux disease)    Hemorrhoids, internal    High cholesterol    History of CABG    Hyperlipidemia 07/07/2008   Lumbar radiculopathy, acute 06/17/2018   Major depressive disorder 07/19/2018   Mitral regurgitation    a. s/p repair with LAA ligation and MAZE at time of surgery   Mitral stenosis    Mild, February, 2011, post mitral valve repair / mild, echo, June, 2011  //  Mild functional mitral stenosis, echo, February, 2012  //   mild stenosis, echo, December, 2013  //  mild, echo, August, 2014   //   Mild, echo, April, 2016    Osteopenia    left hip    Pacemaker-Medtronic 05/20/2012   Pacemaker placed September, 2013   //   upgraded to biventricular CRT pacemaker November 04, 2013    Permanent atrial fibrillation    a. s/p MAZE b. s/p AVN ablation  Personal history of colonic polyps 04/29/2021   PONV (postoperative nausea and vomiting)    Pulmonary HTN    QT prolongation    Tikosyn and Effexor. QT prolonged October 13, 2011, peak is in dose reduced from 500  to -250 twice a day   Right ventricular dysfunction    Mild to moderate, echo, February, 2011 / normalized echo, June, 2011 /  RV normal, echo, February, 2012  //   right ventricle reported as good echo, December, 2013    S/P mitral valve repair    [facility] / Maze procedure/ atrial appendage removed were tied off    Sick sinus syndrome    s/p Medtronic dual chamber PPM 05/17/12    Sleep difficulties 09/13/2019   Vitamin D deficiency 02/20/2014   Past Surgical History:  Procedure Laterality Date   ABDOMINAL HYSTERECTOMY  ?2002   AV  NODE ABLATION N/A 05/28/2012   Procedure: AV NODE ABLATION;  Surgeon: Thompson Grayer, MD;  Location: [facility] C[facility] LAB;  Service: Cardiovascular;  Laterality: N/A;   BI-VENTRICULAR PACEMAKER UPGRADE N/A 11/04/2013   upgrade of previously implanted dual chamber pacemaker to [place] by Dr Rayann Heman   BREAST EXCISIONAL BIOPSY Left over 10 years ago   benign   BREAST LUMPECTOMY Left 05/26/2021   Procedure: LEFT BREAST LUMPECTOMY;  Surgeon: Stark Klein, MD;  Location: [place];  Service: General;  Laterality: Left;   BREAST SURGERY     CARDIOVERSION  02/08/2012   Procedure: CARDIOVERSION;  Surgeon: Carlena Bjornstad, MD;  Location: [place];  Service: Cardiovascular;  Laterality: N/A;   CARDIOVERSION  02/29/2012   Procedure: CARDIOVERSION;  Surgeon: Carlena Bjornstad, MD;  Location: [place];  Service: Cardiovascular;  Laterality: N/A;   CARDIOVERSION  05/15/2012   Procedure: CARDIOVERSION;  Surgeon: Carlena Bjornstad, MD;  Location: [place];  Service: Cardiovascular;  Laterality: N/A;   CATARACT EXTRACTION, BILATERAL  2017   [place]   COLON SURGERY  2014   right hemicolectomy    COLONOSCOPY     CORONARY ARTERY BYPASS GRAFT  2001   CABG X1 "at time of mitral valve repair" (12/04/2012   DIAGNOSTIC LAPAROSCOPIC LIVER BIOPSY Left 12/03/2012   Procedure: DIAGNOSTIC LAPAROSCOPIC LIVER BIOPSY;  Surgeon: Stark Klein, MD;  Location: [place];  Service: General;  Laterality: Left;   EYE SURGERY     bilateral cataract surgery   LAPAROSCOPIC RIGHT HEMI COLECTOMY  12/03/2012   Procedure: LAPAROSCOPIC RIGHT HEMI COLECTOMY;  Surgeon: Stark Klein, MD;  Location: [place];  Service: General;;   LUMBAR LAMINECTOMY/ DECOMPRESSION WITH MET-RX Left 06/17/2018   Procedure: left Lumbar three-fourextraforaminal Microdiscectomy with Met-Rx;  Surgeon: Kristeen Miss, MD;  Location: [place];  Service: Neurosurgery;  Laterality: Left;   MAZE  2001   w/ MVR & CABG   MITRAL VALVE REPAIR  2001   "anterior and posterior leaflets"  (12/04/2012)   PERMANENT PACEMAKER INSERTION N/A 05/17/2012   MDT Adapta L implanted by Dr Rayann Heman for tachy/brady syndrome   POLYPECTOMY     TONSILLECTOMY AND ADENOIDECTOMY  ~ 8341   UMBILICAL HERNIA REPAIR N/A 12/03/2012   Procedure: HERNIA REPAIR UMBILICAL ADULT;  Surgeon: Stark Klein, MD;  Location: [place];  Service: General;  Laterality: N/A;    ROS- all systems are reviewed and negative except as per HPI above  Current Outpatient Medications  Medication Sig Dispense Refill   ACCU-CHEK SOFTCLIX LANCETS lancets USE TO CHECK BLOOD SUGARS  TWICE A DAY 200 each 2  acetaminophen (TYLENOL) 500 MG tablet Take 500 mg by mouth at bedtime.     amoxicillin (AMOXIL) 500 MG capsule TAKE 4 CAPSULES PRIOR TO DENTAL APPOINTMENTS AS DIRECTED 40 capsule 0   atorvastatin (LIPITOR) 40 MG tablet TAKE 1 TABLET BY MOUTH  DAILY 90 tablet 3   Blood Glucose Monitoring Suppl (ACCU-CHEK AVIVA PLUS) w/Device KIT Use to check blood sugars daily Dx E11.9 1 kit 0   carvedilol (COREG) 12.5 MG tablet TAKE 1 TABLET BY MOUTH  TWICE DAILY WITH A MEAL 180 tablet 3   cholecalciferol (VITAMIN D) 1000 UNITS tablet Take 1,000 Units by mouth daily.      fish oil-omega-3 fatty acids 1000 MG capsule Take 1 g by mouth 2 (two) times daily.     glucose blood (ACCU-CHEK AVIVA PLUS) test strip USE 2 TIMES DAILY AS  DIRECTED 200 each 2   JARDIANCE 25 MG TABS tablet TAKE 1 TABLET BY MOUTH  DAILY 90 tablet 3   loratadine (CLARITIN) 10 MG tablet Take 10 mg by mouth daily.     Polyethyl Glycol-Propyl Glycol (SYSTANE) 0.4-0.3 % SOLN Place 1 drop into both eyes every 6 (six) hours as needed (dry eyes).     sodium chloride (OCEAN) 0.65 % SOLN nasal spray Place 1 spray into both nostrils 2 (two) times daily as needed for congestion.     spironolactone (ALDACTONE) 25 MG tablet TAKE 1 TABLET BY MOUTH  DAILY 90 tablet 3   tamoxifen (NOLVADEX) 20 MG tablet Take 1 tablet (20 mg total) by mouth daily. 30 tablet 3   trandolapril (MAVIK) 4 MG tablet TAKE  1 TABLET BY MOUTH  TWICE DAILY FOR BLOOD  PRESSURE 180 tablet 1   warfarin (COUMADIN) 2.5 MG tablet TAKE 1 TABLET BY MOUTH DAILY OR AS DIRECTED BY ANTICOAGULATION  CLINIC 104 tablet 3   No current facility-administered medications for this visit.    Physical Exam: There were no vitals filed for this visit.  GEN- The patient is well appearing, alert and oriented x 3 today.   Head- normocephalic, atraumatic Eyes-  Sclera clear, conjunctiva pink Ears- hearing intact Oropharynx- clear Lungs- Clear to ausculation bilaterally, normal work of breathing Chest- pacemaker pocket is well healed Heart- Regular rate and rhythm (paced) GI- soft, NT, ND, + BS Extremities- no clubbing, cyanosis, or edema  Pacemaker interrogation- reviewed in detail today,  See PACEART report  ekg tracing ordered today is personally reviewed and shows afib, BiV paced  Assessment and Plan:  1. Symptomatic complete heart block Normal biv pacemaker function See Pace Art report No changes today she is device dependant today Follows with Sharman Cheek  2. Chronic systolic dysfunction Ef has improved with CRT Echo 09/22/20 is reviewed and reveals EF 45% Follows with Dr Aundra Dubin  3. HTN Stable No change required today Labs 06/30/21 reviewed  4. Permanent afib Rate controlled s/p AV nodal ablation Chads2vasc score is at least 5.  She is on coumadin Given recent hematoma with her lumpectomy and consideration of hormonal breast cancer therapy, she is considering switching to eliquis in January.  She will reach out to [name] or Dr Aundra Dubin at that time.  5. CAD s/p LIMA to LAD at time of MV surgery 2001  Return to see EP APP annually  Thompson Grayer MD, [facility] 07/11/2021 3:45 PM

## 2021-07-11 NOTE — Patient Instructions (Addendum)
Medication Instructions:  Your physician recommends that you continue on your current medications as directed. Please refer to the Current Medication list given to you today. *If you need a refill on your cardiac medications before your next appointment, please call your pharmacy*  Lab Work: None. If you have labs (blood work) drawn today and your tests are completely normal, you will receive your results only by: [place] (if you have MyChart) OR A paper copy in the mail If you have any lab test that is abnormal or we need to change your treatment, we will call you to review the results.  Testing/Procedures: None.  Follow-Up: At [facility], you and your health needs are our priority.  As part of our continuing mission to provide you with exceptional heart care, we have created designated Provider Care Teams.  These Care Teams include your primary Cardiologist (physician) and Advanced Practice Providers (APPs -  Physician Assistants and Nurse Practitioners) who all work together to provide you with the care you need, when you need it.  Your physician wants you to follow-up in: 12 months with  one of the following Advanced Practice Providers on your designated Care Team:    Legrand Como "Jonni Sanger" Chalmers Cater, [place]   You will receive a reminder letter in the mail two months in advance. If you don't receive a letter, please call our office to schedule the follow-up appointment.  Remote monitoring is used to monitor your Pacemaker from home. This monitoring reduces the number of office visits required to check your device to one time per year. It allows [name] to keep an eye on the functioning of your device to ensure it is working properly. You are scheduled for a device check from home on 09/27/20. You may send your transmission at any time that day. If you have a wireless device, the transmission will be sent automatically. After your physician reviews your transmission, you will receive a postcard  with your next transmission date.  We recommend signing up for the patient portal called "MyChart".  Sign up information is provided on this After Visit Summary.  MyChart is used to connect with patients for Virtual Visits (Telemedicine).  Patients are able to view lab/test results, encounter notes, upcoming appointments, etc.  Non-urgent messages can be sent to your provider as well.   To learn more about what you can do with MyChart, go to [email or web address].    Any Other Special Instructions Will Be Listed Below (If Applicable).

## 2021-07-12 ENCOUNTER — Ambulatory Visit: Payer: Medicare Other

## 2021-07-12 DIAGNOSIS — Z7901 Long term (current) use of anticoagulants: Secondary | ICD-10-CM

## 2021-07-12 LAB — POCT INR: INR: 1.6 — AB (ref 2.0–3.0)

## 2021-07-12 NOTE — Progress Notes (Signed)
Take 1 1/2 tablets today and take 1 1/2 tablets and then continue taking 1 tablet daily. Recheck in 1 wks.

## 2021-07-12 NOTE — Patient Instructions (Addendum)
Pre visit review using our clinic review tool, if applicable. No additional management support is needed unless otherwise documented below in the visit note.  Take 1 1/2 tablets today and take 1 1/2 tablets and then continue taking 1 tablet daily. Recheck in 1 wks.

## 2021-07-12 NOTE — Progress Notes (Signed)
EPIC Encounter for ICM Monitoring  Patient Name: Anita Duke is a 80 y.o. female Date: 07/12/2021 Primary Care Physican: Binnie Rail, MD Primary Cardiologist: [place] Electrophysiologist: [place] Bi-V Pacing:  96%         06/07/2021 Weight: 152 lbs         Battery Longevity: 7.6 months                          Spoke with patient and heart failure questions reviewed.  Pt asymptomatic for fluid accumulation and feeling well.   [place] Thoracic impedance suggesting normal fluid levels but starting downward trend on transmission date 11/7.   Prescribed:  Jardiance 25 mg take 1 tablet daily Spironolactone 25 mg take 1 tablet daily   Labs: 04/28/2021 Creatinine 0.56, BUN 14, Potassium 3.4, Sodium 144, GFR >60 03/31/2021 Creatinine 0.68, BUN 19, Potassium 3.8, Sodium 142, GFR 81.79 03/22/2021 Creatinine 0.68, BUN 17, Potassium 3.8, Sodium 140, GFR >60 A complete set of results can be found in Results Review.   Recommendations:   Advised to limit ssalt intake.  No changes and encouraged to call if experiencing any fluid symptoms.   Follow-up plan: ICM clinic phone appointment on 08/15/2021.   91 day device clinic remote transmission 09/27/2021.   EP/Cardiology Office Visits:   09/02/2021 with Dr Aundra Dubin.   Copy of ICM check sent to Dr. Rayann Heman.       3 month ICM trend: 07/11/2021.    1 Year ICM trend:       [name], RN 07/12/2021 2:36 PM

## 2021-07-15 NOTE — Progress Notes (Signed)
Agree with management.  Conor Filsaime J Parisha Beaulac, MD  

## 2021-07-18 NOTE — Progress Notes (Signed)
Assessment/Plan:   Mild cognitive impairment MoCA today is 23/30, with poor effort, abandoning task quite frequently.  The score may not be accurate and she may have scored higher than this with effort.   Recommendations:  Discussed safety both in and out of the home.  Discussed the importance of regular daily schedule with inclusion of crossword puzzles to maintain brain function.  Continue to monitor mood by PCP Stay active at least 30 minutes at least 3 times a week.  Naps should be scheduled and should be no longer than 60 minutes and should not occur after 2 PM.  Sleep study strongly recommended, patient politely declines at this time. Mediterranean diet is recommended  Repeat neuropsychological evaluation prior to the next visit in 2023, for clarity of diagnosis. Handout for CBT has been given to her, to find counselors to discuss her emotional issues. No indication for antidementia medication for now Follow up in 6  months.    Subjective:    Anita Duke is a very pleasant 80 y.o. female right-handed woman with a history of hypertension, hyperlipidemia, pulmonary HTN, atrial fibrillation on Coumadin, s/p PPM, QT prolongation, colon cancer in 2014, mitral regurgitation s/p repair, depression, with memory loss. Head CT no acute changes (unable to do MRI due to PPM). Neuropsychological evaluation in July 2021 was largely within normal limits, we discussed that there is no evidence of dementia at this time. She is worried due to her mother's history and that she is alone with no family any more.she is seen today in follow up for memory loss and likely is due for neuropsychological testing in 2023.  She was last seen in April 2022.  She is here alone.  Previous records as well as any outside records available were reviewed prior to todays visit.  She is not on antidementia medications.   Since her last visit, she states she does not think her memory is wonderful, but has not  worsened. She thinks it is because she is not paying attention. She lives alone in [place] at [place]. She states she has no family left, which worries her about the future.  She admits to feeling very lonely and she is very tearful during the visit.  She denies getting lost driving, denies missing medications or bills. She forgets what she went to get in a room.  She denies leaving objects in unusual places.  She continues to write things down, and is bothered by her friends who are "making fun of her for doing so ". She reports 7 hours of sleep at best, but feels she does not sleep well.  Of note, sleep study was recommended, and which she did not pursue.  She denies daytime drowsiness, and does not take naps. She endorses mood issues, she cannot tell the difference with increased dose of Bupropion. She denies any headaches, dizziness, focal numbness/tingling/weakness, no falls.  She states that during this time, she was diagnosed with left breast cancer, and cannot initiate treatment until her breast is healed.  These "does not help much my depression".  She enjoys doing word puzzles and word finding and watching TV.  She tries to walk on a daily basis. She denies hallucinations or paranoia.  She is independent of bathing and dressing.  She places the medications in a pillbox.  She is independent in her finances, denies forgetting to pay any bills.  Her appetite is good, denies trouble swallowing.  She cooks and denies leaving the stove on.  She drives and denies getting lost.   HIstory on Initial Assessment 02/13/2020: This is an 80 year old right-handed woman with a history of hypertension, hyperlipidemia, pulmonary HTN, atrial fibrillation on Coumadin, s/p PPM, QT prolongation, colon cancer in 2014, mitral regurgitation s/p repair, depression, presenting for evaluation of memory loss. She feels her memory is consistent with age, however a friend that she sees daily is the one who is concerned.  They eat dinner out every night and go to the same restaurant, then one night her friend called her to switch to a different restaurant but she forgot and went to the other one. Her friend then told her that she has been doing little things like that and should be evaluated. She lives alone with no family close by. She lives in independent living at [place]. She drives and denies getting lost driving. She denies missing medications or bill payments. She occasionally misplaces things. She has not left the stove on. Her mother was diagnosed with Alzheimer's disease (by autopsy). She denies any significant head injuries or alcohol use.    She denies any headaches, dizziness, diplopia, dysarthria, neck/back pain, focal numbness/tingling/weakness, bowel/bladder dysfunction, anosmia, or tremors. All her life she has been prone to choking, but this has not been happening recently. She does not sleep well at all, Trazodone caused dizziness. She feels melatonin is helping her, she recently increased to 2m qhs and slept better the past 2 days. She denies any falls. When asked about mood, she states "I'm just a cranky old lady." She gets depressed and lonely sometimes and became tearful in the office talking of her deceased husband. She likes to garden and keep busy. She retired at age [age]working at the [employer].    Laboratory Data:      Lab Results  Component Value Date    TSH 0.77 09/16/2020         Lab Results  Component Value Date    VITAMINB12 1,280 (H) 03/16/2020         Lab Results  Component Value Date    HGBA1C 6.8 (H) 09/16/2020      PREVIOUS MEDICATIONS:   CURRENT MEDICATIONS:  Outpatient Encounter Medications as of 07/19/2021  Medication Sig   ACCU-CHEK SOFTCLIX LANCETS lancets USE TO CHECK BLOOD SUGARS  TWICE A DAY   acetaminophen (TYLENOL) 500 MG tablet Take 500 mg by mouth at bedtime.   atorvastatin (LIPITOR) 40 MG tablet TAKE 1 TABLET BY MOUTH  DAILY   Blood Glucose  Monitoring Suppl (ACCU-CHEK AVIVA PLUS) w/Device KIT Use to check blood sugars daily Dx E11.9   carvedilol (COREG) 12.5 MG tablet TAKE 1 TABLET BY MOUTH  TWICE DAILY WITH A MEAL   fish oil-omega-3 fatty acids 1000 MG capsule Take 1 g by mouth 2 (two) times daily.   glucose blood (ACCU-CHEK AVIVA PLUS) test strip USE 2 TIMES DAILY AS  DIRECTED   JARDIANCE 25 MG TABS tablet TAKE 1 TABLET BY MOUTH  DAILY   loratadine (CLARITIN) 10 MG tablet Take 10 mg by mouth daily.   Polyethyl Glycol-Propyl Glycol (SYSTANE) 0.4-0.3 % SOLN Place 1 drop into both eyes every 6 (six) hours as needed (dry eyes).   sodium chloride (OCEAN) 0.65 % SOLN nasal spray Place 1 spray into both nostrils 2 (two) times daily as needed for congestion.   spironolactone (ALDACTONE) 25 MG tablet TAKE 1 TABLET BY MOUTH  DAILY   tamoxifen (NOLVADEX) 20 MG tablet Take 1 tablet (20 mg total) by  mouth daily.   trandolapril (MAVIK) 4 MG tablet TAKE 1 TABLET BY MOUTH  TWICE DAILY FOR BLOOD  PRESSURE   warfarin (COUMADIN) 2.5 MG tablet TAKE 1 TABLET BY MOUTH DAILY OR AS DIRECTED BY ANTICOAGULATION  CLINIC   amoxicillin (AMOXIL) 500 MG capsule TAKE 4 CAPSULES PRIOR TO DENTAL APPOINTMENTS AS DIRECTED (Patient not taking: Reported on 07/19/2021)   cholecalciferol (VITAMIN D) 1000 UNITS tablet Take 1,000 Units by mouth daily.    No facility-administered encounter medications on file as of 07/19/2021.     Objective:     PHYSICAL EXAMINATION:    VITALS:   Vitals:   07/19/21 0849  BP: (!) 159/68  Pulse: 76  Resp: 18  SpO2: 95%  Weight: 147 lb (66.7 kg)  Height: _0  (1.575 m)    GEN:  The patient appears stated age and is in NAD. HEENT:  Normocephalic, atraumatic.   Neurological examination:  General: NAD, well-groomed, appears stated age.  Very tearful throughout the visit Orientation: The patient is alert. Oriented to person, place and date Cranial nerves: There is good facial symmetry.The speech is fluent and clear. No  aphasia or dysarthria. Fund of knowledge is appropriate. Recent memory is impaired, remote memory is intact.  Attention and concentration are reduced, in view of poor effort from her part  Able to name objects and repeat phrases.  Hearing is intact to conversational tone.    Sensation: Sensation is intact to light touch throughout Motor: Strength is at least antigravity x4. Tremors: none  DTR's 2/4 in [place] Cognitive Assessment  07/19/2021  Visuospatial/ Executive (0/5) 2  Naming (0/3) 3  Attention: Read list of digits (0/2) 1  Attention: Read list of letters (0/1) 1  Attention: Serial 7 subtraction starting at 100 (0/3) 2  Language: Repeat phrase (0/2) 2  Language : Fluency (0/1) 0  Abstraction (0/2) 2  Delayed Recall (0/5) 4  Orientation (0/6) 6  Total 23  Adjusted Score (based on education) 23   MMSE - Mini Mental State Exam 06/20/2017  Orientation to time 5  Orientation to Place 5  Registration 3  Attention/ Calculation 5  Recall 3  Language- name 2 objects 2  Language- repeat 1  Language- follow 3 step command 3  Language- read & follow direction 1  Write a sentence 1  Copy design 1  Total score [place] Mental Exam 02/13/2020  Weekday Correct 1  Current year 1  What state are we in? 1  Amount spent 1  Amount left 0  # of Animals 3  5 objects recall 4  Number series 2  Hour markers 2  Time correct 1  Placed X in triangle correctly 1  Largest Figure 1  Name of female 2  Date back to work 0  Type of work 2  State she lived in 2  Total score 24       Movement examination: Tone: There is normal tone in the UE/LE Abnormal movements:  no tremor.  No myoclonus.  No asterixis.   Coordination:  There is no decremation with RAM's. Normal finger to nose  Gait and Station: The patient has no difficulty arising out of a deep-seated chair without the use of the hands. The patient's stride length is good.  Gait is cautious and narrow.         Total time spent on today's visit was 30 minutes, including both face-to-face time and nonface-to-face time. Time  included that spent on review of records (prior notes available to me/labs/imaging if pertinent), discussing treatment and goals, answering patient's questions and coordinating care.  Cc:  Binnie Rail, MD Sharene Butters, PA-C

## 2021-07-19 ENCOUNTER — Ambulatory Visit: Payer: Medicare Other | Admitting: Physician Assistant

## 2021-07-19 ENCOUNTER — Encounter: Payer: Self-pay | Admitting: Physician Assistant

## 2021-07-19 ENCOUNTER — Other Ambulatory Visit: Payer: Self-pay

## 2021-07-19 VITALS — BP 159/68 | HR 76 | Resp 18 | Ht 62.0 in | Wt 147.0 lb

## 2021-07-19 DIAGNOSIS — R413 Other amnesia: Secondary | ICD-10-CM | POA: Diagnosis not present

## 2021-07-19 NOTE — Patient Instructions (Addendum)
Good to see you!   1. We will plan for repeat Neurocognitive testing     2. Follow-up in 6 month  3. A list of Cognitive Behavioral Therapist was given     RECOMMENDATIONS FOR ALL PATIENTS WITH MEMORY PROBLEMS: 1. Continue to exercise (Recommend 30 minutes of walking everyday, or 3 hours every week) 2. Increase social interactions - continue going to [place] and enjoy social gatherings with friends and family 3. Eat healthy, avoid fried foods and eat more fruits and vegetables 4. Maintain adequate blood pressure, blood sugar, and blood cholesterol level. Reducing the risk of stroke and cardiovascular disease also helps promoting better memory. 5. Avoid stressful situations. Live a simple life and avoid aggravations. Organize your time and prepare for the next day in anticipation. 6. Sleep well, avoid any interruptions of sleep and avoid any distractions in the bedroom that may interfere with adequate sleep quality 7. Avoid sugar, avoid sweets as there is a strong link between excessive sugar intake, diabetes, and cognitive impairment We discussed the Mediterranean diet, which has been shown to help patients reduce the risk of progressive memory disorders and reduces cardiovascular risk. This includes eating fish, eat fruits and green leafy vegetables, nuts like almonds and hazelnuts, walnuts, and also use olive oil. Avoid fast foods and fried foods as much as possible. Avoid sweets and sugar as sugar use has been linked to worsening of memory function.

## 2021-07-21 ENCOUNTER — Ambulatory Visit: Payer: Medicare Other

## 2021-07-21 ENCOUNTER — Other Ambulatory Visit: Payer: Self-pay

## 2021-07-21 DIAGNOSIS — Z7901 Long term (current) use of anticoagulants: Secondary | ICD-10-CM

## 2021-07-21 LAB — POCT INR: INR: 2.3 (ref 2.0–3.0)

## 2021-07-21 NOTE — Progress Notes (Signed)
Continue taking 1 tablet daily. Recheck in 4 wks.

## 2021-07-21 NOTE — Patient Instructions (Addendum)
Pre visit review using our clinic review tool, if applicable. No additional management support is needed unless otherwise documented below in the visit note.  Continue taking 1 tablet daily. Recheck in 4 wks.

## 2021-08-11 ENCOUNTER — Telehealth: Payer: Self-pay | Admitting: Internal Medicine

## 2021-08-11 ENCOUNTER — Encounter (HOSPITAL_COMMUNITY): Payer: Self-pay | Admitting: Cardiology

## 2021-08-11 NOTE — Telephone Encounter (Signed)
Successful telephone call to patient to follow up on her concern for device battery life per phone note. Patient actually wanted to discuss who would replace her device once she reached RRT if Dr. Rayann Heman is no longer with [facility]. Provided reassurance that patient would be assigned a provider and she would continue to be monitored monthly via remote monitor. All patient questions answered. Patient appreciative of follow p and reassurance that she would indeed have an EP Cardiologist after the first of the year.

## 2021-08-11 NOTE — Telephone Encounter (Signed)
  1. Has your device fired? NO  2. Is you device beeping? NO  3. Are you experiencing draining or swelling at device site? NO  4. Are you calling to see if we received your device transmission? NO  5. Have you passed out? PT IS CALLING TO FIND OUT HOW MUCH BATTERY LIFE SHE HAS ON HER PACEMAKER    Please route to [place]

## 2021-08-12 ENCOUNTER — Telehealth: Payer: Self-pay

## 2021-08-12 NOTE — Telephone Encounter (Signed)
Returned call to patient to answer question regarding who will be replacing Dr Rayann Heman for her device care. Advised she will be transitioned to one of the other partners that manages devices.  Advised Dr Aundra Dubin also answered her question about the physicians and she can look at this reply in [place].  She was appreciative of call back.

## 2021-08-15 ENCOUNTER — Ambulatory Visit (INDEPENDENT_AMBULATORY_CARE_PROVIDER_SITE_OTHER): Payer: Medicare Other

## 2021-08-15 DIAGNOSIS — I5022 Chronic systolic (congestive) heart failure: Secondary | ICD-10-CM

## 2021-08-15 DIAGNOSIS — Z950 Presence of cardiac pacemaker: Secondary | ICD-10-CM | POA: Diagnosis not present

## 2021-08-18 ENCOUNTER — Other Ambulatory Visit: Payer: Self-pay | Admitting: Internal Medicine

## 2021-08-18 ENCOUNTER — Other Ambulatory Visit: Payer: Self-pay

## 2021-08-18 ENCOUNTER — Ambulatory Visit: Payer: Medicare Other

## 2021-08-18 DIAGNOSIS — Z7901 Long term (current) use of anticoagulants: Secondary | ICD-10-CM

## 2021-08-18 LAB — POCT INR: INR: 1.6 — AB (ref 2.0–3.0)

## 2021-08-18 NOTE — Progress Notes (Signed)
Increase dose today to 1 1/2 tablets and increase dose tomorrow to 1 1/2 tablets and then continue taking 1 tablet daily. Recheck in 2 wks.

## 2021-08-18 NOTE — Patient Instructions (Addendum)
Pre visit review using our clinic review tool, if applicable. No additional management support is needed unless otherwise documented below in the visit note.  Increase dose today to 1 1/2 tablets and increase dose tomorrow to 1 1/2 tablets and then continue taking 1 tablet daily. Recheck in 2 wks.

## 2021-08-19 NOTE — Progress Notes (Signed)
EPIC Encounter for ICM Monitoring  Patient Name: Anita Duke is a 80 y.o. female Date: 08/19/2021 Primary Care Physican: Binnie Rail, MD Primary Cardiologist: Varanasi/McLean Electrophysiologist: Allred Bi-V Pacing:  96%         06/07/2021 Weight: 152 lbs         Battery Longevity: 7.7 months                          Spoke with patient and heart failure questions reviewed.  Pt asymptomatic for fluid accumulation and feeling well.   Corvue Thoracic impedance suggesting normal fluid levels.   Prescribed:  Jardiance 25 mg take 1 tablet daily Spironolactone 25 mg take 1 tablet daily   Labs: 06/30/2021 Creatinine 0.71, BUN 13, Potassium 3.6, Sodium 142 06/29/2021 Creatinine 0.67, BUN 13, Potassium 3.9, Sodium 138, GFR >60 04/28/2021 Creatinine 0.56, BUN 14, Potassium 3.4, Sodium 144, GFR >60 03/31/2021 Creatinine 0.68, BUN 19, Potassium 3.8, Sodium 142, GFR 81.79 03/22/2021 Creatinine 0.68, BUN 17, Potassium 3.8, Sodium 140, GFR >60 A complete set of results can be found in Results Review.   Recommendations:   No changes and encouraged to call if experiencing any fluid symptoms.   Follow-up plan: ICM clinic phone appointment on 09/19/2021.   91 day device clinic remote transmission 09/27/2021.   EP/Cardiology Office Visits:   09/02/2021 with Dr Aundra Dubin.   Copy of ICM check sent to Dr. Rayann Heman.       3 month ICM trend: 08/15/2021.    12-14 Month ICM trend:       [name], RN 08/19/2021 3:19 PM

## 2021-09-01 ENCOUNTER — Ambulatory Visit: Payer: Medicare Other

## 2021-09-01 ENCOUNTER — Other Ambulatory Visit: Payer: Self-pay

## 2021-09-01 DIAGNOSIS — Z7901 Long term (current) use of anticoagulants: Secondary | ICD-10-CM

## 2021-09-01 LAB — POCT INR: INR: 1.5 — AB (ref 2.0–3.0)

## 2021-09-01 NOTE — Progress Notes (Signed)
Patient ID: Anita Duke, female   DOB: Feb 15, 1941, 80 y.o.   MRN: [id number]  Medical screening examination/treatment/procedure(s) were performed by non-physician practitioner and as supervising physician I was immediately available for consultation/collaboration.  I agree with above. Cathlean Cower, MD

## 2021-09-01 NOTE — Patient Instructions (Addendum)
Pre visit review using our clinic review tool, if applicable. No additional management support is needed unless otherwise documented below in the visit note.  Increase dose today to 1 1/2 tablets and increase dose tomorrow to 1 1/2 tablets and then change weekly dose to take 1 tablet daily except take 1 1/2 tablets on Mondays and Thursdays. Recheck in 1 wks.

## 2021-09-01 NOTE — Progress Notes (Addendum)
Increase dose today to 1 1/2 tablets and increase dose tomorrow to 1 1/2 tablets and then change weekly dose to take 1 tablet daily except take 1 1/2 tablets on Mondays and Thursdays. Recheck in 1 wks.

## 2021-09-02 ENCOUNTER — Ambulatory Visit (HOSPITAL_BASED_OUTPATIENT_CLINIC_OR_DEPARTMENT_OTHER)
Admission: RE | Admit: 2021-09-02 | Discharge: 2021-09-02 | Disposition: A | Discharge status: Home / Self Care | Payer: Medicare Other | Source: Ambulatory Visit | Attending: Internal Medicine | Admitting: Internal Medicine

## 2021-09-02 ENCOUNTER — Encounter (HOSPITAL_COMMUNITY): Payer: Medicare Other | Admitting: Cardiology

## 2021-09-02 ENCOUNTER — Encounter (HOSPITAL_COMMUNITY): Payer: Self-pay | Admitting: Cardiology

## 2021-09-02 ENCOUNTER — Ambulatory Visit (HOSPITAL_COMMUNITY)
Admission: RE | Admit: 2021-09-02 | Discharge: 2021-09-02 | Disposition: A | Discharge status: Home / Self Care | Payer: Medicare Other | Source: Ambulatory Visit | Attending: Cardiology | Admitting: Cardiology

## 2021-09-02 ENCOUNTER — Other Ambulatory Visit (HOSPITAL_COMMUNITY): Payer: Self-pay

## 2021-09-02 VITALS — BP 120/60 | HR 70 | Wt 143.8 lb

## 2021-09-02 DIAGNOSIS — I5022 Chronic systolic (congestive) heart failure: Secondary | ICD-10-CM

## 2021-09-02 DIAGNOSIS — Z950 Presence of cardiac pacemaker: Secondary | ICD-10-CM | POA: Insufficient documentation

## 2021-09-02 DIAGNOSIS — IMO0002 Reserved for concepts with insufficient information to code with codable children: Secondary | ICD-10-CM

## 2021-09-02 DIAGNOSIS — I428 Other cardiomyopathies: Secondary | ICD-10-CM | POA: Insufficient documentation

## 2021-09-02 DIAGNOSIS — Z952 Presence of prosthetic heart valve: Secondary | ICD-10-CM | POA: Insufficient documentation

## 2021-09-02 DIAGNOSIS — I2510 Atherosclerotic heart disease of native coronary artery without angina pectoris: Secondary | ICD-10-CM | POA: Diagnosis not present

## 2021-09-02 DIAGNOSIS — I519 Heart disease, unspecified: Secondary | ICD-10-CM | POA: Diagnosis not present

## 2021-09-02 DIAGNOSIS — I272 Pulmonary hypertension, unspecified: Secondary | ICD-10-CM | POA: Diagnosis not present

## 2021-09-02 DIAGNOSIS — Z7901 Long term (current) use of anticoagulants: Secondary | ICD-10-CM | POA: Diagnosis not present

## 2021-09-02 DIAGNOSIS — E119 Type 2 diabetes mellitus without complications: Secondary | ICD-10-CM | POA: Insufficient documentation

## 2021-09-02 DIAGNOSIS — I442 Atrioventricular block, complete: Secondary | ICD-10-CM | POA: Diagnosis not present

## 2021-09-02 DIAGNOSIS — Z79899 Other long term (current) drug therapy: Secondary | ICD-10-CM | POA: Insufficient documentation

## 2021-09-02 DIAGNOSIS — Z853 Personal history of malignant neoplasm of breast: Secondary | ICD-10-CM | POA: Diagnosis not present

## 2021-09-02 DIAGNOSIS — R9430 Abnormal result of cardiovascular function study, unspecified: Secondary | ICD-10-CM | POA: Insufficient documentation

## 2021-09-02 DIAGNOSIS — I4821 Permanent atrial fibrillation: Secondary | ICD-10-CM | POA: Diagnosis not present

## 2021-09-02 DIAGNOSIS — Z7984 Long term (current) use of oral hypoglycemic drugs: Secondary | ICD-10-CM | POA: Diagnosis not present

## 2021-09-02 LAB — ECHOCARDIOGRAM COMPLETE
AR max vel: 1.94 cm2
AV Area VTI: 1.76 cm2
AV Area mean vel: 1.83 cm2
AV Mean grad: 2 mmHg
AV Peak grad: 3.8 mmHg
Ao pk vel: 0.98 m/s
Area-P 1/2: 3.85 cm2
MV VTI: 1.37 cm2
S' Lateral: 4 cm

## 2021-09-02 LAB — BASIC METABOLIC PANEL
Anion gap: 9 (ref 5–15)
BUN: 13 mg/dL (ref 8–23)
CO2: 26 mmol/L (ref 22–32)
Calcium: 9.2 mg/dL (ref 8.9–10.3)
Chloride: 104 mmol/L (ref 98–111)
Creatinine, Ser: 0.52 mg/dL (ref 0.44–1.00)
GFR, Estimated: >60 mL/min (ref >60)
Glucose, Bld: 123 mg/dL — ABNORMAL HIGH (ref 70–99)
Potassium: 4 mmol/L (ref 3.5–5.1)
Sodium: 139 mmol/L (ref 135–145)

## 2021-09-02 MED ORDER — ENTRESTO 24-26 MG PO TABS
1.0000 | ORAL_TABLET | Freq: Two times a day (BID) | ORAL | 6 refills | Status: DC
  Filled 2021-09-02: qty 60, 30d supply, fill #0
  Filled 2021-09-27: qty 60, 30d supply, fill #1
  Filled 2021-10-24: qty 60, 30d supply, fill #2
  Filled 2021-11-18: qty 60, 30d supply, fill #3
  Filled 2021-12-14: qty 60, 30d supply, fill #4
  Filled 2022-01-16: qty 60, 30d supply, fill #5
  Filled 2022-02-19: qty 60, 30d supply, fill #6

## 2021-09-02 NOTE — Patient Instructions (Signed)
Medication Changes:  Stop [employer] 24/26 Twice daily 36 hours after stopping trandolpril   Lab Work:  Labs done today, your results will be available in MyChart, we will contact you for abnormal readings.   Testing/Procedures:  Repeat labs in 10 days  Referrals:  none  Special Instructions // Education:  none  Follow-Up in: 6 months (June 2023)  ** Call office in May for appointment**  At the [facility], you and your health needs are our priority. We have a designated team specialized in the treatment of Heart Failure. This Care Team includes your primary Heart Failure Specialized Cardiologist (physician), Advanced Practice Providers (APPs- Physician Assistants and Nurse Practitioners), and Pharmacist who all work together to provide you with the care you need, when you need it.   You may see any of the following providers on your designated Care Team at your next follow up:  Dr Glori Bickers Dr Haynes Kerns, NP Lyda Jester, [place] [facility] [place], [place] Audry Riles, PharmD   Please be sure to bring in all your medications bottles to every appointment.   Need to Contact us:  If you have any questions or concerns before your next appointment please send [name] a message through [place] or call our office at [phone].    TO LEAVE A MESSAGE FOR THE NURSE SELECT OPTION 2, PLEASE LEAVE A MESSAGE INCLUDING: YOUR NAME DATE OF BIRTH CALL BACK NUMBER REASON FOR CALL**this is important as we prioritize the call backs  YOU WILL RECEIVE A CALL BACK THE SAME DAY AS LONG AS YOU CALL BEFORE 4:00 PM

## 2021-09-02 NOTE — Progress Notes (Signed)
°  Echocardiogram 2D Echocardiogram has been performed.  Anita Duke 09/02/2021, 10:49 AM

## 2021-09-04 NOTE — Progress Notes (Signed)
Advanced Heart Failure Clinic Note    Primary Care: Dr. Billey Gosling Primary Cardiologist: Dr. Aundra Dubin EP: Dr. Rayann Heman   HPI: Anita Duke is a 81 y.o. with a past medical history of pulmonary HTN, mitral regurgitation s/p mitral valve repair, MAZE procedure at the [facility] in 2001, at that time she also had a LIMA to LAD graft placed. Also with history of permanent Afib on warfarin s/p AVN ablation. At one time she was on Tikosyn which was stopped for QT prolongation.  She has chronic systolic CHF (EF 26% in 04/3418).  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 Last cath in 2014 with atretic LIMA to LAD but nonobstructive disease in LAD.   Echo in 10/18 showed EF improved to 55-60% with mild to moderate RV dysfunction.   Echo in 7/20 showed EF 50% with basal inferior and inferolateral akinesis, mildly decreased RV systolic function, s/p MV repair with trivial MR, no MS, normal IVC.  Echo in 1/22 showed EF 45-50% with basal inferior and basal inferolateral akinesis, mildly decreased RV systolic function, s/p MV repair with trivial MR and mean gradient 4 mmHg.   Patient developed breast cancer in 2022 and is status post lumpectomy.  Echo was done today and reviewed, EF 45-50%, diffuse hypokinesis, mildly decreased RV function, repaired mitral valve with trivial MR no MS, normal IVC.   Patient returns for followup of CHF.  No significant exertional dyspnea, no orthopnea/PND.  No chest pain. Overall, seems to be doing well.   Labs (5/18): K 4.7, creatinine 0.62, LDL 71, HDL 61 Labs (10/18): K 3.5, creatinine 0.59, TSH normal Labs (7/20): K 3.9, creatinine 0.69, hgb 15.1, LDL 37, TSH normal Labs (7/21): K 3.8, creatinine 0.64, LDL 52, HDL 64 Labs (1/22): K 4.1, creatinine 0.68, LDL 40 Labs (7/22): LDL 47 Labs (10/22): K 3.6, creatinine 0.71  St Jude device interrogation: stable thoracic impedance, 93% BiV pacing  ROS: All systems reviewed and negative except as per  HPI.  PMH: 1. Type II diabetes. 2. COPD 3. Depression 4. H/o colon cancer 5. CAD: LIMA-LAD in 4/01 with MV repair.   - LHC (8/14): LIMA-LAD atretic, 50-60% mid LAD stenosis.  6. Mitral regurgitation: S/p MV repair at the [facility] in 4/01.  She also had LAA ligation and MAZE. Echo in 7/20 with trivial MR and no MS.  7. 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   - St Jude CRT-P device.  - Echo (4/16): EF 40%, mild LV dilation, normal RV size with mild to moderately decreased systolic function, s/p mitral valve repair with mild MR, no significant stenosis.  - Echo (10/18): EF 55-60%, severe LAE, RV with mild to moderate systolic dysfunction, mild-moderate TR, PASP 35 mmHg.  - Echo (7/20): EF 50% with basal inferior and inferolateral akinesis, mildly decreased RV systolic function, s/p MV repair with trivial MR, no MS, normal IVC.  - Echo (1/22): EF 45-50% with basal inferior and basal inferolateral akinesis, mildly decreased RV systolic function, s/p MV repair with trivial MR and mean gradient 4 mmHg. - Echo (12/22): EF 45-50%, diffuse hypokinesis, mildly decreased RV function, repaired mitral valve with trivial MR no MS, normal IVC.  8. Permanent atrial fibrillation: S/p MAZE in 4/01.  S/p AV nodal ablation in 9/13.   9. PFTs (1/19): No significant obstruction or restriction in the lungs.  10. Breast cancer: S/p lumpectomy in 2022.    Current Outpatient Medications  Medication Sig Dispense Refill   ACCU-CHEK SOFTCLIX LANCETS  lancets USE TO CHECK BLOOD SUGARS  TWICE A DAY 200 each 2   acetaminophen (TYLENOL) 500 MG tablet Take 500 mg by mouth at bedtime.     atorvastatin (LIPITOR) 40 MG tablet TAKE 1 TABLET BY MOUTH  DAILY 90 tablet 3   Blood Glucose Monitoring Suppl (ACCU-CHEK AVIVA PLUS) w/Device KIT Use to check blood sugars daily Dx E11.9 1 kit 0   carvedilol (COREG) 12.5 MG tablet TAKE 1 TABLET BY MOUTH  TWICE DAILY WITH A MEAL 180 tablet 3   cholecalciferol  (VITAMIN D) 1000 UNITS tablet Take 1,000 Units by mouth daily.      fish oil-omega-3 fatty acids 1000 MG capsule Take 1 g by mouth 2 (two) times daily.     glucose blood (ACCU-CHEK AVIVA PLUS) test strip USE 2 TIMES DAILY AS  DIRECTED 200 each 2   JARDIANCE 25 MG TABS tablet TAKE 1 TABLET BY MOUTH  DAILY 90 tablet 3   loratadine (CLARITIN) 10 MG tablet Take 10 mg by mouth daily.     Polyethyl Glycol-Propyl Glycol (SYSTANE) 0.4-0.3 % SOLN Place 1 drop into both eyes every 6 (six) hours as needed (dry eyes).     sacubitril-valsartan (ENTRESTO) 24-26 MG Take 1 tablet by mouth 2 (two) times daily. 60 tablet 6   sodium chloride (OCEAN) 0.65 % SOLN nasal spray Place 1 spray into both nostrils 2 (two) times daily as needed for congestion.     spironolactone (ALDACTONE) 25 MG tablet TAKE 1 TABLET BY MOUTH  DAILY 90 tablet 3   warfarin (COUMADIN) 2.5 MG tablet TAKE 1 TABLET BY MOUTH DAILY OR AS DIRECTED BY ANTICOAGULATION  CLINIC 104 tablet 3   amoxicillin (AMOXIL) 500 MG capsule TAKE 4 CAPSULES PRIOR TO DENTAL APPOINTMENTS AS DIRECTED (Patient not taking: Reported on 07/19/2021) 40 capsule 0   tamoxifen (NOLVADEX) 20 MG tablet Take 1 tablet (20 mg total) by mouth daily. (Patient not taking: Reported on 09/02/2021) 30 tablet 3   No current facility-administered medications for this encounter.    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:  [employer]   Tobacco Use   Smoking status: Never   Smokeless tobacco: Never  Vaping Use   Vaping Use: Never used  Substance and Sexual Activity   Alcohol use: No   Drug use: No   Sexual activity: Not Currently    Birth control/protection: Surgical  Other Topics Concern   Not on file  Social History Narrative   No caffeine   Right handed    Lives alone    Social Determinants of Health   Financial Resource Strain: Not on file  Food Insecurity: Not on file  Transportation Needs: Not on file  Physical Activity: Not on file  Stress: Not on file  Social Connections: Not on file  Intimate Partner Violence: Not on  file      Family History  Problem Relation Age of Onset   Diabetes Mother    Kidney disease Mother    Thyroid disease Mother    Alzheimer's disease Mother        Symptom onset in late 49s; confirmed via autopsy   Depression Mother    Heart disease Father    Diabetes Father    Heart disease Sister        x 2   Colon cancer Maternal Aunt        dx 81s; mother's paternal half sister   Colon cancer Maternal Aunt        dx late 41s   Heart disease Paternal Uncle        x 6   Cancer Maternal Grandfather        unknown type; dx unknown age   Diabetes Paternal Grandmother    Diabetes Paternal Grandfather    Colon cancer Cousin    Brain cancer Nephew 52   Colon polyps Neg Hx    Adrenal disorder Neg Hx    Esophageal cancer Neg Hx    Rectal cancer Neg Hx    Stomach cancer Neg Hx     Vitals:   09/02/21 1123  BP: 120/60  Pulse: 70  SpO2: 96%  Weight: 65.2 kg (143 lb 12.8 oz)    PHYSICAL EXAM: General: NAD Neck: No JVD, no thyromegaly or thyroid nodule.  Lungs: Clear to auscultation bilaterally with normal respiratory effort. CV: Nondisplaced PMI.  Heart regular S1/S2, no S3/S4, no murmur.  No peripheral edema.  No carotid bruit.  Normal pedal pulses.  Abdomen: Soft, nontender, no hepatosplenomegaly, no distention.  Skin: Intact without lesions or  rashes.  Neurologic: Alert and oriented x 3.  Psych: Normal affect. Extremities: No clubbing or cyanosis.  HEENT: Normal.   ASSESSMENT & PLAN: 1. Chronic systolic CHF: EF 09% in 6283. NICM most likely.  She has LIMA-LAD that is atretic, but no flow limiting disease in the LAD.  Cardiomyopathy may be due to RV pacing after AV nodal ablation in 9/13. She now has St Jude CRT-P device and EF was up to 55-60% on 10/18 echo. Echo in 7/20 showed EF about 50%.  Echo in 1/22 and in 12/22 showed stable EF 45-50%.  NYHA class I-II symptoms.  She is not volume overloaded on exam or by Corevue.  - Continue Coreg 12.5 mg bid.  - Continue Jardiance.  - Stop trandolapril, after 36 hrs, start Entresto 24/26 bid. BMET today and in 10 days.   - She is doing fine off Lasix, not volume overloaded.   - Continue spironolactone 25 mg daily.   2. CAD: LIMA-LAD at time of MV surgery in 4/01, but LIMA-LAD atretic on 8/14 cath.  However, at that time her LAD had moderate stenosis that did not appear flow-limiting (50-60%).  No chest pain.  - Continue atorvastatin 40 mg daily. Good LDL in 7/22.  - No ASA given stable CAD with use of warfarin 3. Complete heart block: She had an AV nodal ablation.  Now s/p CRT-P.   4. Permanent atrial fibrillation: Now s/p AV nodal ablation and BiV pacing. - Continue warfarin for anticoagulation. CBC today.  - She could swich anticoagulation to Eliquis, data suggests ok to use DOAC with valve repair, bioprosthetic valve.  However, she wants to continue warfarin as she has been on it stably x years.  5. DM: Continue Jardiance.  6. S/p MV repair: Stable mitral  valve repair on 12/22 echo.    Followup in 6 months with APP.   Anita Champagne, MD 09/04/21

## 2021-09-08 ENCOUNTER — Ambulatory Visit: Payer: Medicare Other

## 2021-09-08 ENCOUNTER — Ambulatory Visit (INDEPENDENT_AMBULATORY_CARE_PROVIDER_SITE_OTHER): Payer: Medicare HMO

## 2021-09-08 ENCOUNTER — Other Ambulatory Visit: Payer: Self-pay

## 2021-09-08 DIAGNOSIS — Z7901 Long term (current) use of anticoagulants: Secondary | ICD-10-CM

## 2021-09-08 LAB — POCT INR: INR: 3.4 — AB (ref 2.0–3.0)

## 2021-09-08 NOTE — Progress Notes (Signed)
Hold dose today and then change weekly dose to take 1 tablet daily except take 1 1/2 tablets on Mondays. Recheck in 2 wks.

## 2021-09-08 NOTE — Patient Instructions (Addendum)
Pre visit review using our clinic review tool, if applicable. No additional management support is needed unless otherwise documented below in the visit note.  Hold dose today and then change weekly dose to take 1 tablet daily except take 1 1/2 tablets on Mondays. Recheck in 2 wks.

## 2021-09-10 ENCOUNTER — Other Ambulatory Visit (HOSPITAL_COMMUNITY): Payer: Self-pay

## 2021-09-13 ENCOUNTER — Ambulatory Visit (HOSPITAL_COMMUNITY)
Admission: RE | Admit: 2021-09-13 | Discharge: 2021-09-13 | Disposition: A | Discharge status: Home / Self Care | Payer: Medicare HMO | Source: Ambulatory Visit | Attending: Cardiology | Admitting: Cardiology

## 2021-09-13 ENCOUNTER — Other Ambulatory Visit: Payer: Self-pay

## 2021-09-13 DIAGNOSIS — R9430 Abnormal result of cardiovascular function study, unspecified: Secondary | ICD-10-CM

## 2021-09-13 LAB — BASIC METABOLIC PANEL
Anion gap: 7 (ref 5–15)
BUN: 12 mg/dL (ref 8–23)
CO2: 27 mmol/L (ref 22–32)
Calcium: 9.1 mg/dL (ref 8.9–10.3)
Chloride: 106 mmol/L (ref 98–111)
Creatinine, Ser: 0.62 mg/dL (ref 0.44–1.00)
GFR, Estimated: >60 mL/min (ref >60)
Glucose, Bld: 144 mg/dL — ABNORMAL HIGH (ref 70–99)
Potassium: 3.8 mmol/L (ref 3.5–5.1)
Sodium: 140 mmol/L (ref 135–145)

## 2021-09-18 ENCOUNTER — Encounter: Payer: Self-pay | Admitting: Internal Medicine

## 2021-09-18 NOTE — Patient Instructions (Addendum)
Blood work was ordered.      Medications changes include :   none    Please followup in 6 months   Health Maintenance, Female Adopting a healthy lifestyle and getting preventive care are important in promoting health and wellness. Ask your health care provider about: The right schedule for you to have regular tests and exams. Things you can do on your own to prevent diseases and keep yourself healthy. What should I know about diet, weight, and exercise? Eat a healthy diet  Eat a diet that includes plenty of vegetables, fruits, low-fat dairy products, and lean protein. Do not eat a lot of foods that are high in solid fats, added sugars, or sodium. Maintain a healthy weight Body mass index (BMI) is used to identify weight problems. It estimates body fat based on height and weight. Your health care provider can help determine your BMI and help you achieve or maintain a healthy weight. Get regular exercise Get regular exercise. This is one of the most important things you can do for your health. Most adults should: Exercise for at least 150 minutes each week. The exercise should increase your heart rate and make you sweat (moderate-intensity exercise). Do strengthening exercises at least twice a week. This is in addition to the moderate-intensity exercise. Spend less time sitting. Even light physical activity can be beneficial. Watch cholesterol and blood lipids Have your blood tested for lipids and cholesterol at 82 years of age, then have this test every 5 years. Have your cholesterol levels checked more often if: Your lipid or cholesterol levels are high. You are older than 82 years of age. You are at high risk for heart disease. What should I know about cancer screening? Depending on your health history and family history, you may need to have cancer screening at various ages. This may include screening for: Breast cancer. Cervical cancer. Colorectal cancer. Skin  cancer. Lung cancer. What should I know about heart disease, diabetes, and high blood pressure? Blood pressure and heart disease High blood pressure causes heart disease and increases the risk of stroke. This is more likely to develop in people who have high blood pressure readings or are overweight. Have your blood pressure checked: Every 3-5 years if you are 58-61 years of age. Every year if you are 74 years old or older. Diabetes Have regular diabetes screenings. This checks your fasting blood sugar level. Have the screening done: Once every three years after age 56 if you are at a normal weight and have a low risk for diabetes. More often and at a younger age if you are overweight or have a high risk for diabetes. What should I know about preventing infection? Hepatitis B If you have a higher risk for hepatitis B, you should be screened for this virus. Talk with your health care provider to find out if you are at risk for hepatitis B infection. Hepatitis C Testing is recommended for: Everyone born from [birth year] through 1966. Anyone with known risk factors for hepatitis C. Sexually transmitted infections (STIs) Get screened for STIs, including gonorrhea and chlamydia, if: You are sexually active and are younger than 82 years of age. You are older than 82 years of age and your health care provider tells you that you are at risk for this type of infection. Your sexual activity has changed since you were last screened, and you are at increased risk for chlamydia or gonorrhea. Ask your health care provider if you are at risk.  Ask your health care provider about whether you are at high risk for HIV. Your health care provider may recommend a prescription medicine to help prevent HIV infection. If you choose to take medicine to prevent HIV, you should first get tested for HIV. You should then be tested every 3 months for as long as you are taking the medicine. Pregnancy If you are about to stop  having your period (premenopausal) and you may become pregnant, seek counseling before you get pregnant. Take 400 to 800 micrograms (mcg) of folic acid every day if you become pregnant. Ask for birth control (contraception) if you want to prevent pregnancy. Osteoporosis and menopause Osteoporosis is a disease in which the bones lose minerals and strength with aging. This can result in bone fractures. If you are 19 years old or older, or if you are at risk for osteoporosis and fractures, ask your health care provider if you should: Be screened for bone loss. Take a calcium or vitamin D supplement to lower your risk of fractures. Be given hormone replacement therapy (HRT) to treat symptoms of menopause. Follow these instructions at home: Alcohol use Do not drink alcohol if: Your health care provider tells you not to drink. You are pregnant, may be pregnant, or are planning to become pregnant. If you drink alcohol: Limit how much you have to: 0-1 drink a day. Know how much alcohol is in your drink. In the [place] one drink equals one 12 oz bottle of beer (355 mL), one 5 oz glass of wine (148 mL), or one 1 oz glass of hard liquor (44 mL). Lifestyle Do not use any products that contain nicotine or tobacco. These products include cigarettes, chewing tobacco, and vaping devices, such as e-cigarettes. If you need help quitting, ask your health care provider. Do not use street drugs. Do not share needles. Ask your health care provider for help if you need support or information about quitting drugs. General instructions Schedule regular health, dental, and eye exams. Stay current with your vaccines. Tell your health care provider if: You often feel depressed. You have ever been abused or do not feel safe at home. Summary Adopting a healthy lifestyle and getting preventive care are important in promoting health and wellness. Follow your health care provider's instructions about healthy diet,  exercising, and getting tested or screened for diseases. Follow your health care provider's instructions on monitoring your cholesterol and blood pressure. This information is not intended to replace advice given to you by your health care provider. Make sure you discuss any questions you have with your health care provider. Document Revised: 01/10/2021 Document Reviewed: 01/10/2021 Elsevier Patient Education  [place].

## 2021-09-18 NOTE — Progress Notes (Signed)
Subjective:    Patient ID: Anita Duke, female    DOB: 1941-04-30, 81 y.o.   MRN: [id number]   This visit occurred during the SARS-CoV-2 public health emergency.  Safety protocols were in place, including screening questions prior to the visit, additional usage of staff PPE, and extensive cleaning of exam room while observing appropriate contact time as indicated for disinfecting solutions.    HPI She is here for a physical exam.   She has no concerns - she is lonely.    Medications and allergies reviewed with patient and updated if appropriate.  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 ([place]) 04/27/2021   Hematoma of arm, right, initial encounter 04/06/2021   B12 deficiency 03/16/2020   Pain in joint of left shoulder 12/30/2019   Sleep difficulties 09/13/2019   Depression 07/19/2018   Lumbar radiculopathy, acute 06/17/2018   Spinal stenosis of lumbar region 03/27/2018   Diabetic neuropathy 02/21/2018   Long term (current) use of anticoagulants 06/01/2017   Bilateral sensorineural hearing loss 03/29/2017   Excessive sweating 07/07/2016   Osteopenia 01/10/2016   Heat intolerance 10/08/2015   CAD (coronary artery disease) 05/28/2014   Vitamin D deficiency 02/20/2014   Long term current use of anticoagulant therapy 70/35/0093   Chronic systolic dysfunction of left ventricle 05/21/2013   Cecal cancer, s/p lap r colectomy 11/20/2012   Acquired complete AV block    Sick sinus syndrome ([place])    Pacemaker-Medtronic 05/20/2012   Allergic rhinitis 04/25/2012   Mild intermittent asthma 02/16/2011   Ejection fraction < 50%    Atrial fibrillation    Mitral regurgitation    Right ventricular dysfunction    Mitral stenosis    History of CABG    Diabetes mellitus type II, controlled  03/02/2009   Hyperlipidemia 07/07/2008   Essential hypertension 07/07/2008   Diverticulosis of large intestine 08/04/2002    Current Outpatient Medications on File Prior to Visit  Medication Sig Dispense Refill   ACCU-CHEK SOFTCLIX LANCETS lancets USE TO CHECK BLOOD SUGARS  TWICE A DAY 200 each 2   acetaminophen (TYLENOL) 500 MG tablet Take 500 mg by mouth at bedtime.     atorvastatin (LIPITOR) 40 MG tablet TAKE 1 TABLET BY MOUTH  DAILY 90 tablet 3   Blood Glucose Monitoring Suppl (ACCU-CHEK AVIVA PLUS) w/Device KIT Use to check blood sugars daily Dx E11.9 1 kit 0   carvedilol (COREG) 12.5 MG tablet TAKE 1 TABLET BY MOUTH  TWICE DAILY WITH A MEAL 180 tablet 3   cholecalciferol (VITAMIN D) 1000 UNITS tablet Take 1,000 Units by mouth daily.      fish oil-omega-3 fatty acids 1000 MG capsule Take 1 g by mouth 2 (two) times daily.     glucose blood (ACCU-CHEK AVIVA PLUS) test strip USE 2 TIMES DAILY AS  DIRECTED 200 each 2   JARDIANCE 25 MG TABS tablet TAKE 1 TABLET BY MOUTH  DAILY 90 tablet 3   loratadine (CLARITIN) 10 MG tablet Take 10 mg by mouth daily.     Polyethyl Glycol-Propyl Glycol (SYSTANE) 0.4-0.3 % SOLN Place 1 drop into both eyes every 6 (six) hours as needed (dry eyes).     sacubitril-valsartan (ENTRESTO) 24-26 MG Take 1 tablet by mouth 2 (two) times daily. 60 tablet 6   sodium chloride (  OCEAN) 0.65 % SOLN nasal spray Place 1 spray into both nostrils 2 (two) times daily as needed for congestion.     spironolactone (ALDACTONE) 25 MG tablet TAKE 1 TABLET BY MOUTH  DAILY 90 tablet 3   tamoxifen (NOLVADEX) 20 MG tablet Take 1 tablet (20 mg total) by mouth daily. 30 tablet 3   warfarin (COUMADIN) 2.5 MG tablet TAKE 1 TABLET BY MOUTH DAILY OR AS DIRECTED BY ANTICOAGULATION  CLINIC 104 tablet 3   amoxicillin (AMOXIL) 500 MG capsule TAKE 4 CAPSULES PRIOR TO DENTAL APPOINTMENTS AS DIRECTED (Patient not taking: Reported on 07/19/2021) 40 capsule 0   No current facility-administered  medications on file prior to visit.    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   Breast cancer ([place])    CAD (coronary artery disease)    LIMA to the LAD at time of mitral valve repair / LIMA atretic,, February, 2011   Cataracts, bilateral    removed bilat    Cecal cancer 2014   colon   CHF (congestive heart failure)    Chronic systolic dysfunction of left ventricle 05/21/2013   Clotting disorder    21 yrs ago blood clot in heart    Colon cancer ([place])    Colon polyp, hyperplastic    Diabetes mellitus type II, controlled 03/02/2009   Diabetic neuropathy 02/21/2018   [facility] - [facility] [place]   Diverticulosis of colon    Diverticulosis of large intestine 08/04/2002   Ejection fraction < 50%    EF 30%, New diagnosis   february, 2011, etiology not clear, consider rate related tachycardia, and not use carvedilol, catheterization no constriction, /    EF 55-60% echo, June, 2011 /     Beta blocker stopped May, 2012 with reactive airway disease.  Patient does not tolerate metoprolol.  Carvedilol stopped  /   EF 55%, echo, February, 2012  //   EF 40-45%, septal dyssynergy, hypokinesis of the a   Essential hypertension 07/07/2008   Family history of adverse reaction to anesthesia    sister had a hard time waking up after anesthesia   Family history of colon cancer 04/29/2021   GERD (gastroesophageal reflux disease)    Hemorrhoids, internal    High cholesterol    History of CABG    Hyperlipidemia 07/07/2008   Lumbar radiculopathy, acute 06/17/2018   Major depressive disorder 07/19/2018   Mitral regurgitation    a. s/p repair with LAA ligation and MAZE at time of surgery   Mitral  stenosis    Mild, February, 2011, post mitral valve repair / mild, echo, June, 2011  //  Mild functional mitral stenosis, echo, February, 2012  //   mild stenosis, echo, December, 2013  //  mild, echo, August, 2014   //   Mild, echo, April, 2016    Osteopenia    left hip    Pacemaker-Medtronic 05/20/2012   Pacemaker placed September, 2013   //   upgraded to biventricular CRT pacemaker November 04, 2013    Permanent atrial fibrillation    a. s/p MAZE b. s/p AVN ablation   Personal  history of colonic polyps 04/29/2021   PONV (postoperative nausea and vomiting)    Pulmonary HTN    QT prolongation    Tikosyn and Effexor. QT prolonged October 13, 2011, peak is in dose reduced from 500  to -250 twice a day   Right ventricular dysfunction    Mild to moderate, echo, February, 2011 / normalized echo, June, 2011 /  RV normal, echo, February, 2012  //   right ventricle reported as good echo, December, 2013    S/P mitral valve repair    [facility] / Maze procedure/ atrial appendage removed were tied off    Sick sinus syndrome    s/p Medtronic dual chamber PPM 05/17/12    Sleep difficulties 09/13/2019   Vitamin D deficiency 02/20/2014    Past Surgical History:  Procedure Laterality Date   ABDOMINAL HYSTERECTOMY  ?2002   AV NODE ABLATION N/A 05/28/2012   Procedure: AV NODE ABLATION;  Surgeon: Thompson Grayer, MD;  Location: [facility] C[facility];  Service: Cardiovascular;  Laterality: N/A;   BI-VENTRICULAR PACEMAKER UPGRADE N/A 11/04/2013   upgrade of previously implanted dual chamber pacemaker to [place] by Dr Rayann Heman   BREAST EXCISIONAL BIOPSY Left over 10 years ago   benign   BREAST LUMPECTOMY Left 05/26/2021   Procedure: LEFT BREAST LUMPECTOMY;  Surgeon: Stark Klein, MD;  Location: [place];  Service: General;  Laterality: Left;   BREAST SURGERY     CARDIOVERSION  02/08/2012   Procedure: CARDIOVERSION;  Surgeon: Carlena Bjornstad, MD;  Location: [place];  Service: Cardiovascular;  Laterality: N/A;    CARDIOVERSION  02/29/2012   Procedure: CARDIOVERSION;  Surgeon: Carlena Bjornstad, MD;  Location: [place];  Service: Cardiovascular;  Laterality: N/A;   CARDIOVERSION  05/15/2012   Procedure: CARDIOVERSION;  Surgeon: Carlena Bjornstad, MD;  Location: [place];  Service: Cardiovascular;  Laterality: N/A;   CATARACT EXTRACTION, BILATERAL  2017   [place]   COLON SURGERY  2014   right hemicolectomy    COLONOSCOPY     CORONARY ARTERY BYPASS GRAFT  2001   CABG X1 "at time of mitral valve repair" (12/04/2012   DIAGNOSTIC LAPAROSCOPIC LIVER BIOPSY Left 12/03/2012   Procedure: DIAGNOSTIC LAPAROSCOPIC LIVER BIOPSY;  Surgeon: Stark Klein, MD;  Location: [place];  Service: General;  Laterality: Left;   EYE SURGERY     bilateral cataract surgery   LAPAROSCOPIC RIGHT HEMI COLECTOMY  12/03/2012   Procedure: LAPAROSCOPIC RIGHT HEMI COLECTOMY;  Surgeon: Stark Klein, MD;  Location: [place];  Service: General;;   LUMBAR LAMINECTOMY/ DECOMPRESSION WITH MET-RX Left 06/17/2018   Procedure: left Lumbar three-fourextraforaminal Microdiscectomy with Met-Rx;  Surgeon: Kristeen Miss, MD;  Location: [place];  Service: Neurosurgery;  Laterality: Left;   MAZE  2001   w/ MVR & CABG   MITRAL VALVE REPAIR  2001   "anterior and posterior leaflets" (12/04/2012)   PERMANENT PACEMAKER INSERTION N/A 05/17/2012   MDT Adapta L implanted by Dr Rayann Heman for tachy/brady syndrome   POLYPECTOMY     TONSILLECTOMY AND ADENOIDECTOMY  ~ 0092   UMBILICAL HERNIA REPAIR N/A 12/03/2012   Procedure: HERNIA REPAIR UMBILICAL ADULT;  Surgeon: Stark Klein, MD;  Location: [place] [facility];  Service: General;  Laterality: N/A;    Social History   Socioeconomic History   Marital status: Widowed    Spouse name: Not on file   Number of children: 0   Years of education: 12   Highest education level: High school graduate  Occupational History   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: [employer]   Tobacco Use   Smoking status: Never   Smokeless tobacco: Never  Vaping Use   Vaping Use: Never used  Substance and Sexual Activity   Alcohol use: No   Drug use: No   Sexual activity: Not Currently    Birth control/protection: Surgical  Other Topics Concern   Not on file  Social History Narrative   No caffeine   Right handed    Lives alone    Social Determinants of Health   Financial Resource Strain: Not on file  Food Insecurity: Not on file  Transportation Needs: Not on file  Physical Activity: Not on file  Stress: Not on file  Social Connections: Not on file    Family History  Problem Relation Age of Onset   Diabetes Mother    Kidney disease Mother    Thyroid disease Mother    Alzheimer's disease Mother        Symptom onset in late 71s; confirmed via autopsy   Depression Mother    Heart disease Father    Diabetes Father    Heart disease Sister        x 2   Colon cancer Maternal Aunt        dx 90s; mother's paternal half sister   Colon cancer Maternal Aunt        dx late 50s   Heart disease Paternal Uncle        x 6   Cancer Maternal Grandfather        unknown type; dx unknown age   Diabetes Paternal Grandmother    Diabetes Paternal Grandfather    Colon cancer Cousin    Brain cancer Nephew 52   Colon polyps Neg Hx    Adrenal disorder Neg Hx    Esophageal cancer Neg Hx    Rectal cancer Neg Hx    Stomach cancer Neg Hx     Review of Systems  Constitutional:  Negative for fever.  Eyes:  Negative for visual disturbance.  Respiratory:  Negative for cough, shortness of breath and wheezing.   Cardiovascular:  Negative for chest pain, palpitations and leg swelling.  Gastrointestinal:  Positive for diarrhea. Negative for abdominal pain, blood in stool, constipation and nausea.       No gerd  Genitourinary:  Negative for dysuria.  Musculoskeletal:  Positive for arthralgias (hand arthritis).  Skin:  Negative for rash.  Neurological:  Negative for  dizziness, light-headedness and headaches.  Psychiatric/Behavioral:  Positive for dysphoric mood and sleep disturbance. The patient is not nervous/anxious.       Objective:   Vitals:   09/20/21 0941  BP: 140/70  Pulse: 73  Temp: 97.9 F (36.6 C)  SpO2: 98%   Filed Weights   09/20/21 0941  Weight: 143 lb (64.9 kg)   Body mass index is 26.16 kg/m.  BP Readings from Last 3 Encounters:  09/20/21 140/70  09/02/21 120/60  07/19/21 (!) 159/68    Wt Readings from Last 3 Encounters:  09/20/21 143 lb (64.9 kg)  09/02/21 143 lb 12.8 oz (65.2 kg)  07/19/21 147 lb (66.7 kg)    Depression screen [facility] 2/9 09/20/2021 03/16/2021 09/16/2020 03/05/2019 03/05/2019  Decreased Interest 0 0 0 0 0  Down, Depressed, Hopeless 0 0 0 0 0  PHQ - 2 Score 0 0 0 0 0  Altered sleeping 0 0 0 0 -  Tired, decreased energy 0 0 0 0 -  Change in appetite 0 0 0 0 -  Feeling bad or failure about yourself  0 0 0 0 -  Trouble concentrating 0 0 0 0 -  Moving slowly or fidgety/restless 0 0 0 0 -  Suicidal thoughts 0 0 0 0 -  PHQ-9 Score 0 0 0 0 -  Difficult doing work/chores - Not difficult at all Not difficult at all - -  Some recent data might be hidden     GAD 7 : Generalized Anxiety Score 09/20/2021  Nervous, Anxious, on Edge 0  Control/stop worrying 0  Worry too much - different things 0  Trouble relaxing 0  Restless 0  Easily annoyed or irritable 0  Afraid - awful might happen 0  Total GAD 7 Score 0       Physical Exam Constitutional: She appears well-developed and well-nourished. No distress.  HENT:  Head: Normocephalic and atraumatic.  Right Ear: External ear normal. Normal ear canal and TM Left Ear: External ear normal.  Normal ear canal and TM Mouth/Throat: Oropharynx is clear and moist.  Eyes: Conjunctivae and EOM are normal.  Neck: Neck supple. No tracheal deviation present. No thyromegaly present.  No carotid bruit  Cardiovascular: Normal rate, regular rhythm and normal heart sounds.    No murmur heard.  No edema. Pulmonary/Chest: Effort normal and breath sounds normal. No respiratory distress. She has no wheezes. She has no rales.  Breast: deferred   Abdominal: Soft. She exhibits no distension. There is no tenderness.  Lymphadenopathy: She has no cervical adenopathy.  Skin: Skin is warm and dry. She is not diaphoretic.  Psychiatric: She has a normal mood and affect. Her behavior is normal.     Lab Results  Component Value Date   WBC 6.4 05/19/2021   HGB 14.1 05/19/2021   HCT 44.5 05/19/2021   PLT 193 05/19/2021   GLUCOSE 144 (H) 09/13/2021   CHOL 140 03/31/2021   TRIG 136.0 03/31/2021   HDL 65.40 03/31/2021   LDLDIRECT 65.0 01/05/2016   LDLCALC 47 03/31/2021   ALT 12 06/30/2021   AST 14 (L) 06/30/2021   NA 140 09/13/2021   K 3.8 09/13/2021   CL 106 09/13/2021   CREATININE 0.62 09/13/2021   BUN 12 09/13/2021   CO2 27 09/13/2021   TSH 0.77 09/16/2020   INR 3.4 (A) 09/20/2021   HGBA1C 7.1 (H) 03/31/2021   MICROALBUR 1.5 01/05/2016         Assessment & Plan:   Physical exam: Screening blood work  ordered Exercise  walking - stressed regular exercise Weight  good for age Substance abuse  none   Screened for depression using the PHQ 9 scale.  No evidence of depression.   Screened for anxiety using GAD7 Scale.  No evidence of anxiety.   Reviewed recommended immunizations.   Health Maintenance  Topic Date Due   COVID-19 Vaccine (4 - Booster for Pfizer series) 08/06/2020   OPHTHALMOLOGY EXAM  06/29/2021   HEMOGLOBIN A1C  10/01/2021   FOOT EXAM  03/16/2022   COLONOSCOPY (Pts 45-50yr Insurance coverage will need to be confirmed)  02/25/2023   DEXA SCAN  04/01/2023   Pneumonia Vaccine 81 Years old  Completed   INFLUENZA VACCINE  Completed   Zoster Vaccines- Shingrix  Completed   HPV VACCINES  Aged Out          See Problem List for Assessment and Plan  of chronic medical problems.

## 2021-09-19 ENCOUNTER — Ambulatory Visit (INDEPENDENT_AMBULATORY_CARE_PROVIDER_SITE_OTHER): Payer: Medicare HMO

## 2021-09-19 DIAGNOSIS — Z950 Presence of cardiac pacemaker: Secondary | ICD-10-CM | POA: Diagnosis not present

## 2021-09-19 DIAGNOSIS — I5022 Chronic systolic (congestive) heart failure: Secondary | ICD-10-CM

## 2021-09-20 ENCOUNTER — Ambulatory Visit (INDEPENDENT_AMBULATORY_CARE_PROVIDER_SITE_OTHER): Payer: Medicare HMO | Admitting: Internal Medicine

## 2021-09-20 ENCOUNTER — Other Ambulatory Visit: Payer: Self-pay

## 2021-09-20 ENCOUNTER — Telehealth: Payer: Self-pay

## 2021-09-20 ENCOUNTER — Ambulatory Visit (INDEPENDENT_AMBULATORY_CARE_PROVIDER_SITE_OTHER): Payer: Medicare HMO

## 2021-09-20 VITALS — BP 140/70 | HR 73 | Temp 97.9°F | Ht 62.0 in | Wt 143.0 lb

## 2021-09-20 DIAGNOSIS — Z0000 Encounter for general adult medical examination without abnormal findings: Secondary | ICD-10-CM | POA: Diagnosis not present

## 2021-09-20 DIAGNOSIS — I4891 Unspecified atrial fibrillation: Secondary | ICD-10-CM

## 2021-09-20 DIAGNOSIS — I10 Essential (primary) hypertension: Secondary | ICD-10-CM

## 2021-09-20 DIAGNOSIS — Z7901 Long term (current) use of anticoagulants: Secondary | ICD-10-CM

## 2021-09-20 DIAGNOSIS — E1142 Type 2 diabetes mellitus with diabetic polyneuropathy: Secondary | ICD-10-CM | POA: Diagnosis not present

## 2021-09-20 DIAGNOSIS — E782 Mixed hyperlipidemia: Secondary | ICD-10-CM

## 2021-09-20 DIAGNOSIS — M85852 Other specified disorders of bone density and structure, left thigh: Secondary | ICD-10-CM | POA: Diagnosis not present

## 2021-09-20 DIAGNOSIS — E1159 Type 2 diabetes mellitus with other circulatory complications: Secondary | ICD-10-CM

## 2021-09-20 DIAGNOSIS — R69 Illness, unspecified: Secondary | ICD-10-CM | POA: Diagnosis not present

## 2021-09-20 DIAGNOSIS — F3289 Other specified depressive episodes: Secondary | ICD-10-CM

## 2021-09-20 LAB — CBC WITH DIFFERENTIAL/PLATELET
Basophils Absolute: 0 10*3/uL (ref 0.0–0.1)
Basophils Relative: 0.6 % (ref 0.0–3.0)
Eosinophils Absolute: 0.1 10*3/uL (ref 0.0–0.7)
Eosinophils Relative: 1.3 % (ref 0.0–5.0)
HCT: 45.4 % (ref 36.0–46.0)
Hemoglobin: 14.6 g/dL (ref 12.0–15.0)
Lymphocytes Relative: 26.6 % (ref 12.0–46.0)
Lymphs Abs: 1.4 10*3/uL (ref 0.7–4.0)
MCHC: 32.1 g/dL (ref 30.0–36.0)
MCV: 87.9 fl (ref 78.0–100.0)
Monocytes Absolute: 0.5 10*3/uL (ref 0.1–1.0)
Monocytes Relative: 10.4 % (ref 3.0–12.0)
Neutro Abs: 3.1 10*3/uL (ref 1.4–7.7)
Neutrophils Relative %: 61.1 % (ref 43.0–77.0)
Platelets: 175 10*3/uL (ref 150.0–400.0)
RBC: 5.16 Mil/uL — ABNORMAL HIGH (ref 3.87–5.11)
RDW: 15.2 % (ref 11.5–15.5)
WBC: 5.1 10*3/uL (ref 4.0–10.5)

## 2021-09-20 LAB — LIPID PANEL
Cholesterol: 138 mg/dL (ref 0–200)
HDL: 59.4 mg/dL (ref >39.00)
LDL Cholesterol: 60 mg/dL (ref 0–99)
NonHDL: 78.99
Total CHOL/HDL Ratio: 2
Triglycerides: 95 mg/dL (ref 0.0–149.0)
VLDL: 19 mg/dL (ref 0.0–40.0)

## 2021-09-20 LAB — COMPREHENSIVE METABOLIC PANEL
ALT: 13 U/L (ref 0–35)
AST: 17 U/L (ref 0–37)
Albumin: 4.4 g/dL (ref 3.5–5.2)
Alkaline Phosphatase: 61 U/L (ref 39–117)
BUN: 19 mg/dL (ref 6–23)
CO2: 28 mEq/L (ref 19–32)
Calcium: 9.4 mg/dL (ref 8.4–10.5)
Chloride: 105 mEq/L (ref 96–112)
Creatinine, Ser: 0.58 mg/dL (ref 0.40–1.20)
GFR: 84.71 mL/min (ref >60.00)
Glucose, Bld: 115 mg/dL — ABNORMAL HIGH (ref 70–99)
Potassium: 3.9 mEq/L (ref 3.5–5.1)
Sodium: 141 mEq/L (ref 135–145)
Total Bilirubin: 1.2 mg/dL (ref 0.2–1.2)
Total Protein: 7 g/dL (ref 6.0–8.3)

## 2021-09-20 LAB — TSH: TSH: 0.56 u[IU]/mL (ref 0.35–5.50)

## 2021-09-20 LAB — POCT INR: INR: 3.4 — AB (ref 2.0–3.0)

## 2021-09-20 LAB — HEMOGLOBIN A1C: Hgb A1c MFr Bld: 7.7 % — ABNORMAL HIGH (ref 4.6–6.5)

## 2021-09-20 NOTE — Assessment & Plan Note (Signed)
Chronic Check lipid panel  Continue atorvastatin 40 mg daily Regular exercise and healthy diet encouraged  

## 2021-09-20 NOTE — Progress Notes (Signed)
Hold dose today and then change weekly dose to take 1 tablet daily. Recheck in 1 wks.

## 2021-09-20 NOTE — Assessment & Plan Note (Signed)
Chronic Lab Results  Component Value Date   HGBA1C 7.1 (H) 03/31/2021   Sugars fairly controlled Check A1c Encouraged diabetic diet, regular exercise Continue Jardiance 25 mg daily

## 2021-09-20 NOTE — Assessment & Plan Note (Addendum)
Chronic Bupropion stopped due to her starting tamoxifen She may feel depressed at times, but mostly she feels lonely. Discussed that we can always consider seeing oncology what SSRI she is able to take with the tamoxifen once she starts and is doing okay with that She does have friends and is very active with them and where she lives Continue regular exercise-walking

## 2021-09-20 NOTE — Assessment & Plan Note (Signed)
Chronic Blood pressure well controlled CMP Continue spironolactone 25 mg daily, Entresto 24-26 mg twice daily, carvedilol 12.5 mg daily

## 2021-09-20 NOTE — Assessment & Plan Note (Signed)
Chronic dexa up to date Walking  Taking calcium and vitamin d Will be starting tamoxifen

## 2021-09-20 NOTE — Assessment & Plan Note (Signed)
Chronic Following with podiatry

## 2021-09-20 NOTE — Telephone Encounter (Signed)
Remote ICM transmission received.  Attempted call to patient regarding ICM remote transmission and no answer and voice mail box full.

## 2021-09-20 NOTE — Progress Notes (Signed)
EPIC Encounter for ICM Monitoring  Patient Name: Anita Duke is a 80 y.o. female Date: 09/20/2021 Primary Care Physican: Binnie Rail, MD Primary Cardiologist: Varanasi/[facility] Electrophysiologist: Allred Bi-V Pacing:  94%         09/02/2021 Office Weight: 143 lbs         Battery Longevity: 6.7 months                          Attempted call to patient and unable to reach.  Transmission reviewed.    Corvue Thoracic impedance suggesting possible fluid accumulation starting 1/9 but trending close to baseline normal.   Prescribed:  Jardiance 25 mg take 1 tablet daily Spironolactone 25 mg take 1 tablet daily   Labs: 09/13/2021 Creatinine 0.62, BUN 12, Potassium 3.8, Sodium 140, GFR >60 09/02/2021 Creatinine 0.52, BUN 13, Potassium 4.0, Sodium 139, GFR >60 06/30/2021 Creatinine 0.71, BUN 13, Potassium 3.6, Sodium 142 06/29/2021 Creatinine 0.67, BUN 13, Potassium 3.9, Sodium 138, GFR >60 04/28/2021 Creatinine 0.56, BUN 14, Potassium 3.4, Sodium 144, GFR >60 03/31/2021 Creatinine 0.68, BUN 19, Potassium 3.8, Sodium 142, GFR 81.79 03/22/2021 Creatinine 0.68, BUN 17, Potassium 3.8, Sodium 140, GFR >60 A complete set of results can be found in Results Review.   Recommendations:   Unable to reach.     Follow-up plan: ICM clinic phone appointment on 09/27/2021 to recheck fluid levels.   91 day device clinic remote transmission 09/27/2021.   EP/Cardiology Office Visits:   Recall 03/01/2022 with [facility] NP/PA. Recall 07/06/2022 with Oda Kilts, PA   Copy of ICM check sent to Dr. Rayann Heman.       3 month ICM trend: 09/19/2021.    12-14 Month ICM trend:     [name], RN 09/20/2021 1:04 PM

## 2021-09-20 NOTE — Patient Instructions (Addendum)
Pre visit review using our clinic review tool, if applicable. No additional management support is needed unless otherwise documented below in the visit note.  Hold dose today and then change weekly dose to take 1 tablet daily. Recheck in 1 wks.

## 2021-09-20 NOTE — Assessment & Plan Note (Signed)
Chronic Following with cardiology Check CBC, CMP, TSH On warfarin, which is monitored by her Coumadin clinic On carvedilol 12.5 mg twice daily

## 2021-09-22 ENCOUNTER — Encounter: Payer: Medicare Other | Admitting: Nurse Practitioner

## 2021-09-27 ENCOUNTER — Other Ambulatory Visit (HOSPITAL_COMMUNITY): Payer: Self-pay

## 2021-09-27 ENCOUNTER — Telehealth: Payer: Self-pay

## 2021-09-27 ENCOUNTER — Ambulatory Visit (INDEPENDENT_AMBULATORY_CARE_PROVIDER_SITE_OTHER): Payer: Medicare HMO

## 2021-09-27 ENCOUNTER — Other Ambulatory Visit: Payer: Self-pay

## 2021-09-27 DIAGNOSIS — Z7901 Long term (current) use of anticoagulants: Secondary | ICD-10-CM | POA: Diagnosis not present

## 2021-09-27 DIAGNOSIS — I5022 Chronic systolic (congestive) heart failure: Secondary | ICD-10-CM

## 2021-09-27 LAB — CUP PACEART REMOTE DEVICE CHECK
Battery Remaining Longevity: 7 mo
Battery Remaining Percentage: 7 %
Battery Voltage: 2.81 V
Date Time Interrogation Session: 20230124020015
Implantable Lead Implant Date: 20130913
Implantable Lead Implant Date: 20130913
Implantable Lead Implant Date: 20150303
Implantable Lead Location: 753858
Implantable Lead Location: 753859
Implantable Lead Location: 753860
Implantable Lead Model: 5076
Implantable Lead Model: 5092
Implantable Pulse Generator Implant Date: 20150303
Lead Channel Impedance Value: 400 Ohm
Lead Channel Impedance Value: 460 Ohm
Lead Channel Pacing Threshold Amplitude: 0.75 V
Lead Channel Pacing Threshold Amplitude: 1.125 V
Lead Channel Pacing Threshold Pulse Width: 0.4 ms
Lead Channel Pacing Threshold Pulse Width: 0.6 ms
Lead Channel Sensing Intrinsic Amplitude: 9.9 mV
Lead Channel Setting Pacing Amplitude: 2 V
Lead Channel Setting Pacing Amplitude: 2.125
Lead Channel Setting Pacing Pulse Width: 0.4 ms
Lead Channel Setting Pacing Pulse Width: 0.6 ms
Lead Channel Setting Sensing Sensitivity: 3 mV
Pulse Gen Model: 3242
Pulse Gen Serial Number: 7548835

## 2021-09-27 LAB — POCT INR: INR: 3 (ref 2.0–3.0)

## 2021-09-27 NOTE — Patient Instructions (Addendum)
Pre visit review using our clinic review tool, if applicable. No additional management support is needed unless otherwise documented below in the visit note.  Continue 1 tablet daily. Recheck in 1 wks. Advised to watch for signs and symptoms of bleeding and abnormal bruising and if this occurs to contact the office or go to the ER.  Pt verbalized understanding.

## 2021-09-27 NOTE — Progress Notes (Signed)
Continue 1 tablet daily. Recheck in 1 wk due to chemotherapy treatment.  Pt reported starting chemotherapy, tamoxifen, which she is aware will interact with warfarin. Advised pt to watch for signs and symptoms of bleeding and abnormal bruising and if this occurs to contact the office or go to the ER.  Pt verbalized understanding.

## 2021-09-27 NOTE — Telephone Encounter (Signed)
[place] remtoe transmission received- Device has 6.9 months remain until ERI.  Establishing monthly battery checks in Epic/ [facility].  Next scheduled check 10/31/21.    Attempted to contact patient to advise.  No answer.  LVM with device clinic # and hours to return phone call.

## 2021-09-28 ENCOUNTER — Ambulatory Visit (INDEPENDENT_AMBULATORY_CARE_PROVIDER_SITE_OTHER): Payer: Medicare HMO

## 2021-09-28 DIAGNOSIS — Z950 Presence of cardiac pacemaker: Secondary | ICD-10-CM

## 2021-09-28 DIAGNOSIS — I5022 Chronic systolic (congestive) heart failure: Secondary | ICD-10-CM

## 2021-09-28 NOTE — Telephone Encounter (Signed)
Patient called and updated about increase in monthly battery check. Patient appreciative of call.

## 2021-09-28 NOTE — Progress Notes (Signed)
EPIC Encounter for ICM Monitoring  Patient Name: Anita Duke is a 81 y.o. female Date: 09/28/2021 Primary Care Physican: Binnie Rail, MD Primary Cardiologist: [facility] Electrophysiologist: Allred Bi-V Pacing:  94%         09/02/2021 Office Weight: 143 lbs         Battery Longevity: 6.9 months (next battery check 2/27)                    Transmission reviewed.    Corvue Thoracic impedance suggesting fluid levels returned to normal.   Prescribed:  Jardiance 25 mg take 1 tablet daily Spironolactone 25 mg take 1 tablet daily   Labs: 09/20/2021 Creatinine 0.58, BUN 19, Potassium 3.9, Sodium 141, GFR 84.71 09/13/2021 Creatinine 0.62, BUN 12, Potassium 3.8, Sodium 140, GFR >60 09/02/2021 Creatinine 0.52, BUN 13, Potassium 4.0, Sodium 139, GFR >60 06/30/2021 Creatinine 0.71, BUN 13, Potassium 3.6, Sodium 142 A complete set of results can be found in Results Review.   Recommendations:   No changes.   Follow-up plan: ICM clinic phone appointment on 10/31/2021.   91 day device clinic remote transmission 12/27/2021.   EP/Cardiology Office Visits:   Recall 03/01/2022 with [facility] NP/PA. Recall 07/06/2022 with Oda Kilts, PA   Copy of ICM check sent to Dr. Rayann Heman.    3 month ICM trend: 09/27/2021.    12-14 Month ICM trend:     [name], RN 09/28/2021 10:23 AM

## 2021-09-29 ENCOUNTER — Other Ambulatory Visit (HOSPITAL_COMMUNITY): Payer: Self-pay

## 2021-10-04 ENCOUNTER — Other Ambulatory Visit: Payer: Self-pay

## 2021-10-04 ENCOUNTER — Inpatient Hospital Stay: Payer: Medicare HMO | Attending: Nurse Practitioner | Admitting: Nurse Practitioner

## 2021-10-04 ENCOUNTER — Encounter: Payer: Self-pay | Admitting: Nurse Practitioner

## 2021-10-04 ENCOUNTER — Other Ambulatory Visit (HOSPITAL_COMMUNITY): Payer: Self-pay

## 2021-10-04 VITALS — BP 152/60 | HR 84 | Temp 97.8°F | Resp 18 | Ht 62.0 in | Wt 145.6 lb

## 2021-10-04 DIAGNOSIS — C180 Malignant neoplasm of cecum: Secondary | ICD-10-CM | POA: Diagnosis not present

## 2021-10-04 DIAGNOSIS — Z170 Estrogen receptor positive status [ER+]: Secondary | ICD-10-CM | POA: Diagnosis not present

## 2021-10-04 DIAGNOSIS — R69 Illness, unspecified: Secondary | ICD-10-CM | POA: Diagnosis not present

## 2021-10-04 DIAGNOSIS — M8580 Other specified disorders of bone density and structure, unspecified site: Secondary | ICD-10-CM | POA: Diagnosis not present

## 2021-10-04 DIAGNOSIS — N951 Menopausal and female climacteric states: Secondary | ICD-10-CM | POA: Diagnosis not present

## 2021-10-04 DIAGNOSIS — C50112 Malignant neoplasm of central portion of left female breast: Secondary | ICD-10-CM

## 2021-10-04 DIAGNOSIS — F32A Depression, unspecified: Secondary | ICD-10-CM | POA: Diagnosis not present

## 2021-10-04 MED ORDER — VENLAFAXINE HCL 37.5 MG PO TABS
37.5000 mg | ORAL_TABLET | Freq: Two times a day (BID) | ORAL | 3 refills | Status: DC
  Filled 2021-10-04: qty 30, 15d supply, fill #0

## 2021-10-04 NOTE — Progress Notes (Signed)
° °CLINIC:  °Survivorship  ° ° °Patient Care Team: °Burns, Stacy J, MD as PCP - General (Internal Medicine) °McLean, Dalton S, MD as PCP - Cardiology (Cardiology) °Katz, Jeffrey D, MD as Consulting Physician (Cardiology) °Kaplan, Robert D, MD (Inactive) as Consulting Physician (Gastroenterology) °Aquino, Karen M, MD as Consulting Physician (Neurology) °Feng, Yan, MD as Consulting Physician (Hematology) °Byerly, Faera, MD as Consulting Physician (General Surgery) °Stuart, Dawn C, RN as Oncology Nurse Navigator °Martini, Keisha N, RN as Oncology Nurse Navigator °Burton, Lacie K, NP as Nurse Practitioner (Nurse Practitioner)  °  ° °REASON FOR VISIT:  °Routine follow-up post-treatment for a recent history of breast cancer. 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 No axillary adenopathy. °  °  °04/11/2021 Cancer Staging  ° Staging form: Breast, AJCC 8th Edition °- Clinical stage from 04/11/2021: Stage IA (cT1c, cN0, cM0, G2, ER+, PR+, HER2-) - Signed by Feng, Yan, MD on 04/27/2021 °Stage prefix: Initial diagnosis °Histologic grading system: 3 grade system ° °  °04/11/2021 Pathology Results  ° Diagnosis °Breast, left, needle core biopsy, 3:00 °- INVASIVE DUCTAL CARCINOMA, SEE COMMENT. °- DUCTAL CARCINOMA IN SITU. °Microscopic Comment °The carcinoma appears grade 2. ° °PROGNOSTIC INDICATORS °Results: °IMMUNOHISTOCHEMICAL AND MORPHOMETRIC ANALYSIS PERFORMED MANUALLY °The tumor cells are NEGATIVE for Her2  (1+). 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  No other pathogenic variants detected in [facility] Cancer[facility]-Expanded +RNAinsight Panel. The report date is May 17, 2021.   ° °The CancerNext-Expanded gene panel offered by [facility] and includes sequencing, rearrangement, and RNA analysis for the following 77 genes: AIP, ALK, APC, ATM, AXIN2, BAP1, BARD1, BLM, BMPR1A, BRCA1, BRCA2, BRIP1, CDC73, CDH1, CDK4, CDKN1B, CDKN2A, CHEK2, CTNNA1, DICER1, FANCC, FH, FLCN, GALNT12, KIF1B, LZTR1, MAX, MEN1, MET, MLH1, MSH2, MSH3, MSH6, MUTYH, NBN, NF1, NF2, NTHL1, PALB2, PHOX2B, PMS2, POT1, PRKAR1A, PTCH1, PTEN, RAD51C, RAD51D, RB1, RECQL, RET, SDHA, SDHAF2, SDHB, SDHC, SDHD, SMAD4, SMARCA4, SMARCB1, SMARCE1, STK11, SUFU, TMEM127, TP53, TSC1, TSC2, VHL and XRCC2 (sequencing and deletion/duplication); EGFR, EGLN1, HOXB13, KIT, MITF, PDGFRA, POLD1, and POLE (sequencing only); EPCAM and GREM1 (deletion/duplication only).  °  °05/26/2021 Definitive Surgery  ° FINAL MICROSCOPIC DIAGNOSIS:  ° °A. BREAST, LEFT, LUMPECTOMY:  °- Invasive ductal carcinoma, 1.5 cm, grade 2  °- Ductal carcinoma in situ, intermediate grade  °- Resection margins are negative for carcinoma; closest is the posterior margin at 0.3 cm  °- Patchy atypical ductal hyperplasia  °- Fibrocystic change with usual ductal hyperplasia, apocrine metaplasia and calcifications  °- Biopsy site changes  °- See oncology table  °  °05/26/2021 Cancer Staging  ° Staging form: Breast, AJCC 8th Edition °- Pathologic   Pathologic stage from 05/26/2021:  Stage Unknown (pT1c, pNX, cM0, G2, ER+, PR+, HER2-) - Signed by Truitt Merle, MD on 06/30/2021 Stage prefix: Initial diagnosis Multigene prognostic tests performed: None Histologic grading system: 3 grade system Residual tumor (R): R0 - None    10/04/2021 Survivorship   SCP delivered in person by Cira Rue, NP     INTERVAL HISTORY:  Anita Duke presents to the Survivorship Clinic today for our initial meeting to review her survivorship care plan detailing her treatment course for breast cancer, as well as monitoring long-term side effects of that treatment, education regarding health maintenance, screening, and overall wellness and health promotion.     Overall, Anita Duke is doing well, left breast wound/incision finally healed from surgery.  Her BP meds were adjusted late December so she did not start tamoxifen until 09/13/2021, she feels "hot" but otherwise does not notice any new side effects.  Denies bone or joint pain.  Denies bleeding/bruising or vaginal bleeding.  Coumadin is being adjusted by cardiology.  She takes vitamin D but not calcium.  She does walk often at [facility] where [facility].  Her depression is worse since weaning off Wellbutrin, she is somewhat tearful today.  She notes she is the only one left in her family and feels lonely.  Shee is interested in starting medication.   REVIEW OF SYSTEMS:  Review of Systems - Oncology Breast: Denies any new nodularity, masses, tenderness, nipple changes, or nipple discharge.    ONCOLOGY TREATMENT TEAM:  1. Surgeon:  Dr. Barry Dienes at [facility] 2. Medical Oncologist: Dr. 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   Breast cancer ([place])    CAD (coronary artery disease)    LIMA to the LAD at time of mitral valve repair / LIMA atretic,, February, 2011   Cataracts, bilateral    removed bilat    Cecal cancer 2014   colon   CHF (congestive heart failure)    Chronic systolic dysfunction of left ventricle 05/21/2013   Clotting disorder    21 yrs ago blood clot in heart    Colon cancer ([place])    Colon polyp, hyperplastic    Diabetes mellitus type II, controlled 03/02/2009   Diabetic neuropathy 02/21/2018   [facility] - [facility] [place] [facility]   Diverticulosis of colon    Diverticulosis of large intestine 08/04/2002   Ejection fraction < 50%    EF 30%, New diagnosis   february, 2011, etiology not clear, consider rate related tachycardia, and not use carvedilol, catheterization no constriction, /    EF 55-60% echo, June, 2011 /     Beta blocker stopped May, 2012 with reactive airway disease.  Patient does not tolerate metoprolol.  Carvedilol stopped  /   EF 55%, echo, February, 2012  //   EF 40-45%, septal dyssynergy, hypokinesis of the a   Essential hypertension 07/07/2008   Family history of adverse reaction to anesthesia    sister had a hard time waking up after anesthesia   Family history of colon cancer 04/29/2021   GERD (gastroesophageal reflux  disease)    Hemorrhoids, internal    High cholesterol    History of CABG    Hyperlipidemia 07/07/2008   Lumbar radiculopathy, acute 06/17/2018   Major depressive disorder 07/19/2018   Mitral regurgitation    a. s/p repair with LAA ligation and MAZE at time of surgery   Mitral stenosis    Mild, February, 2011, post mitral valve repair / mild, echo, June, 2011  //  Mild functional mitral stenosis, echo, February, 2012  //   mild stenosis, echo, December, 2013  //  mild, echo, August, 2014   //   Mild, echo, April, 2016    Osteopenia    left hip    Pacemaker-Medtronic 05/20/2012    Pacemaker placed September, 2013   //   upgraded to biventricular CRT pacemaker November 04, 2013    Permanent atrial fibrillation    a. s/p MAZE b. s/p AVN ablation   Personal history of colonic polyps 04/29/2021   PONV (postoperative nausea and vomiting)    Pulmonary HTN    QT prolongation    Tikosyn and Effexor. QT prolonged October 13, 2011, peak is in dose reduced from 500  to -250 twice a day   Right ventricular dysfunction    Mild to moderate, echo, February, 2011 / normalized echo, June, 2011 /  RV normal, echo, February, 2012  //   right ventricle reported as good echo, December, 2013    S/P mitral valve repair    [facility] / Maze procedure/ atrial appendage removed were tied off    Sick sinus syndrome    s/p Medtronic dual chamber PPM 05/17/12    Sleep difficulties 09/13/2019   Vitamin D deficiency 02/20/2014   Past Surgical History:  Procedure Laterality Date   ABDOMINAL HYSTERECTOMY  ?2002   AV NODE ABLATION N/A 05/28/2012   Procedure: AV NODE ABLATION;  Surgeon: Thompson Grayer, MD;  Location: [facility] CATH LAB;  Service: Cardiovascular;  Laterality: N/A;   BI-VENTRICULAR PACEMAKER UPGRADE N/A 11/04/2013   upgrade of previously implanted dual chamber pacemaker to [place] by Dr Rayann Heman   BREAST EXCISIONAL BIOPSY Left over 10 years ago   benign   BREAST LUMPECTOMY Left 05/26/2021   Procedure: LEFT BREAST LUMPECTOMY;  Surgeon: Stark Klein, MD;  Location: [place];  Service: General;  Laterality: Left;   BREAST SURGERY     CARDIOVERSION  02/08/2012   Procedure: CARDIOVERSION;  Surgeon: Carlena Bjornstad, MD;  Location: [place];  Service: Cardiovascular;  Laterality: N/A;   CARDIOVERSION  02/29/2012   Procedure: CARDIOVERSION;  Surgeon: Carlena Bjornstad, MD;  Location: [place];  Service: Cardiovascular;  Laterality: N/A;   CARDIOVERSION  05/15/2012   Procedure: CARDIOVERSION;  Surgeon: Carlena Bjornstad, MD;  Location: [place];  Service: Cardiovascular;  Laterality:  N/A;   CATARACT EXTRACTION, BILATERAL  2017   [place]   COLON SURGERY  2014   right hemicolectomy    COLONOSCOPY     CORONARY ARTERY BYPASS GRAFT  2001   CABG X1 "at time of mitral valve repair" (12/04/2012   DIAGNOSTIC LAPAROSCOPIC LIVER BIOPSY Left 12/03/2012   Procedure: DIAGNOSTIC LAPAROSCOPIC LIVER BIOPSY;  Surgeon: Stark Klein, MD;  Location: [place];  Service: General;  Laterality: Left;   EYE SURGERY     bilateral cataract surgery   LAPAROSCOPIC RIGHT HEMI COLECTOMY  12/03/2012   Procedure: LAPAROSCOPIC RIGHT HEMI COLECTOMY;  Surgeon: Stark Klein, MD;  Location: [place];  Service: General;;   LUMBAR  Left 06/17/2018  ° Procedure: left Lumbar three-fourextraforaminal Microdiscectomy with Met-Rx;  Surgeon: Elsner, Henry, MD;  Location: [facility] OR;  Service: Neurosurgery;  Laterality: Left;  °• MAZE  2001  ° w/ MVR & CABG  °• MITRAL VALVE REPAIR  2001  ° "anterior and posterior leaflets" (12/04/2012)  °• PERMANENT PACEMAKER INSERTION N/A 05/17/2012  ° MDT Adapta L implanted by Dr Allred for tachy/brady syndrome  °• POLYPECTOMY    °• TONSILLECTOMY AND ADENOIDECTOMY  ~ 1950  °• UMBILICAL HERNIA REPAIR N/A 12/03/2012  ° Procedure: HERNIA REPAIR UMBILICAL ADULT;  Surgeon: Faera Byerly, MD;  Location: [facility] OR;  Service: General;  Laterality: N/A;  ° ° ° °ALLERGIES:  °Allergies  °Allergen Reactions  °• Demerol [Meperidine] Nausea And Vomiting  °• Morphine Nausea And Vomiting  °• Januvia [Sitagliptin] Other (See Comments)  °  Headaches, did not feel well  °• Metformin And Related Diarrhea  °• Tetanus Toxoid Rash and Other (See Comments)  °  "years ago"  ° ° ° °CURRENT MEDICATIONS:  °Outpatient Encounter Medications as of 10/04/2021  °Medication Sig  °• ACCU-CHEK SOFTCLIX LANCETS lancets USE TO CHECK BLOOD SUGARS  TWICE A DAY  °• acetaminophen (TYLENOL) 500 MG tablet Take 500 mg by mouth at bedtime.  °• amoxicillin (AMOXIL) 500 MG capsule TAKE 4 CAPSULES PRIOR TO  DENTAL APPOINTMENTS AS DIRECTED  °• atorvastatin (LIPITOR) 40 MG tablet TAKE 1 TABLET BY MOUTH  DAILY  °• Blood Glucose Monitoring Suppl (ACCU-CHEK AVIVA PLUS) w/Device KIT Use to check blood sugars daily Dx E11.9  °• carvedilol (COREG) 12.5 MG tablet TAKE 1 TABLET BY MOUTH  TWICE DAILY WITH A MEAL  °• cholecalciferol (VITAMIN D) 1000 UNITS tablet Take 1,000 Units by mouth daily.   °• fish oil-omega-3 fatty acids 1000 MG capsule Take 1 g by mouth 2 (two) times daily.  °• glucose blood (ACCU-CHEK AVIVA PLUS) test strip USE 2 TIMES DAILY AS  DIRECTED  °• JARDIANCE 25 MG TABS tablet TAKE 1 TABLET BY MOUTH  DAILY  °• loratadine (CLARITIN) 10 MG tablet Take 10 mg by mouth daily.  °• Polyethyl Glycol-Propyl Glycol (SYSTANE) 0.4-0.3 % SOLN Place 1 drop into both eyes every 6 (six) hours as needed (dry eyes).  °• sacubitril-valsartan (ENTRESTO) 24-26 MG Take 1 tablet by mouth 2 (two) times daily.  °• sodium chloride (OCEAN) 0.65 % SOLN nasal spray Place 1 spray into both nostrils 2 (two) times daily as needed for congestion.  °• spironolactone (ALDACTONE) 25 MG tablet TAKE 1 TABLET BY MOUTH  DAILY  °• tamoxifen (NOLVADEX) 20 MG tablet Take 1 tablet (20 mg total) by mouth daily.  °• warfarin (COUMADIN) 2.5 MG tablet TAKE 1 TABLET BY MOUTH DAILY OR AS DIRECTED BY ANTICOAGULATION  CLINIC  ° °No facility-administered encounter medications on file as of 10/04/2021.  ° ° ° °ONCOLOGIC FAMILY HISTORY:  °Family History  °Problem Relation Age of Onset  °• Diabetes Mother   °• Kidney disease Mother   °• Thyroid disease Mother   °• Alzheimer's disease Mother   °     Symptom onset in late 60s; confirmed via autopsy  °• Depression Mother   °• Heart disease Father   °• Diabetes Father   °• Heart disease Sister   °     x 2  °• Colon cancer Maternal Aunt   °     dx 90s; mother's paternal half sister  °• Colon cancer Maternal Aunt   °     dx late 70s  °•   Heart disease Paternal Uncle   °     x 6  °• Cancer Maternal Grandfather   °     unknown  type; dx unknown age  °• Diabetes Paternal Grandmother   °• Diabetes Paternal Grandfather   °• Colon cancer Cousin   °• Brain cancer Nephew 52  °• Colon polyps Neg Hx   °• Adrenal disorder Neg Hx   °• Esophageal cancer Neg Hx   °• Rectal cancer Neg Hx   °• Stomach cancer Neg Hx   ° ° ° °GENETIC COUNSELING/TESTING: °Yes, FH carrier ° °SOCIAL HISTORY:  °Anita Duke is widowed and lives alone at [place]. She has no children.  Anita Duke is not currently working but remains active at her facility.  She denies any current or history of tobacco, alcohol, or illicit drug use.   ° ° °PHYSICAL EXAMINATION:  °Vital Signs: °  °Vitals:  ° 10/04/21 0855  °BP: (!) 152/60  °Pulse: 84  °Resp: 18  °Temp: 97.8 °F (36.6 °C)  °SpO2: 99%  ° °Filed Weights  ° 10/04/21 0855  °Weight: 145 lb 9.6 oz (66 kg)  ° °General: Well-nourished, well-appearing female in no acute distress.   °HEENT: Sclerae anicteric.  °Lymph: No cervical, supraclavicular, or infraclavicular lymphadenopathy noted on palpation.  °Cardiovascular: Regular rate and rhythm. °Respiratory: Clear; breathing non-labored.  °Neuro: No focal deficits. Steady gait.  °Psych: Mood and affect normal and appropriate for situation. Tearful at times. °Extremities: No edema. °MSK: No focal spinal tenderness to palpation.  Full range of motion in bilateral upper extremities °Skin: Warm and dry. °Breast exam: Left breast distortion secondary to lumpectomy, incisions completely healed with scar tissue.  Some lumpy breast tissue in the right.  No palpable mass or nodularity in either breast or axilla that I could appreciate. ° °LABORATORY DATA:  °None for this visit. ° °DIAGNOSTIC IMAGING:  °None for this visit.  ° °  ° °ASSESSMENT AND PLAN:  °Ms.. Duke is a pleasant 80 y.o. female with Stage 1A left breast invasive ductal carcinoma, ER+/PR+/HER2-, diagnosed in 04/2021, treated with lumpectomy and anti-estrogen therapy with tamoxifen beginning in 09/2021.  She presents to the  Survivorship Clinic for our initial meeting and routine follow-up post-completion of treatment for breast cancer.  ° ° °1. Stage IA left breast cancer:  Anita Duke has eventually recovered well from definitive treatment for breast cancer after long healing process.  Breast exam shows no concern for recurrence.  She will follow-up with her medical oncologist, Dr. Feng in 12/2021 with history and physical exam per surveillance protocol.  She will continue her anti-estrogen therapy with tamoxifen. Thus far, she is tolerating well except warmth/hot flashes. She was instructed to make Dr. Feng or myself aware if she begins to experience any worsening side effects of the medication and I could see her back in clinic to help manage those side effects, as needed. Side effects of Tamoxifen were again reviewed with her as well. Today, a comprehensive survivorship care plan and treatment summary was reviewed with the patient today detailing her breast cancer diagnosis, treatment course, potential late/long-term effects of treatment, appropriate follow-up care with recommendations for the future, and patient education resources.  A copy of this summary, along with a letter will be sent to the patient’s primary care provider via In Basket message after today’s visit.   ° °2.  Depression: She weaned off Wellbutrin to start tamoxifen, depression worsened.  She is somewhat tearful today.  Her main issue   is loneliness.  She is interested in trying medication, she was on Effexor many years ago but not sure why she stopped.  Given her hot flashes from tamoxifen, plus depression, this would be my first choice.  I have reached out to Dr. McLean for his input on Effexor given that she is on Coumadin. ° °3. Bone health:  Given Anita Duke's age/history of breast cancer and her history of osteopenia, her bone density should be monitored.  We reviewed the bone strengthening qualities of tamoxifen.  She is not on calcium.  I encouraged her  to continue daily vitamin D and weightbearing exercise such as walking.  Her last DEXA scan was 03/2021 which showed lowest T score -1.4. ° °4. Cancer screening:  Due to Anita Duke's history and her age, she should receive screening for skin cancers and colon cancer.  She has a history of cecal cancer, last colonoscopy 02/2020 with 3-year recall if still appropriate at that time.  The information and recommendations are listed on the patient's comprehensive care plan/treatment summary and were reviewed in detail with the patient.   ° °5. Health maintenance and wellness promotion: Anita Duke was encouraged to consume 5-7 servings of fruits and vegetables per day. We reviewed the "Nutrition Rainbow". She was also encouraged to engage in moderate exercise for 30 minutes per day most days of the week. We discussed the [employer] fitness program, which is designed for cancer survivors to help them become more physically fit after cancer treatments.  She was instructed to limit her alcohol consumption and continue to abstain from tobacco use.   °  °6. Support services/counseling: It is not uncommon for this period of the patient's cancer care trajectory to be one of many emotions and stressors.  We discussed an opportunity for her to participate in the next session of FY[facility] ("Finding Your New Normal") support group series designed for patients after they have completed treatment.   Anita Duke was encouraged to take advantage of our many other support services programs, support groups, and/or counseling in coping with her new life as a cancer survivor after completing anti-cancer treatment.  She was offered support today through active listening and expressive supportive counseling.  She was given information regarding our available services and encouraged to contact me with any questions or for help enrolling in any of our support group/programs.  ° ° °Dispo:   °-Return to cancer center 12/2021 °-Mammogram due in  04/2022 °-Start Effexor for depression and hot flashes pending response from cardiology °-Follow up with surgery as scheduled, patient thinks next week °-She is welcome to return back to the Survivorship Clinic at any time; no additional follow-up needed at this time.  °-Consider referral back to survivorship as a long-term survivor for continued surveillance ° °A total of (40) minutes of face-to-face time was spent with this patient with greater than 50% of that time in counseling and care-coordination. ° ° °Lacie Burton, NP °Survivorship Program °[facility] °[phone] ° ° °Note: PRIMARY CARE PROVIDER °Burns, Stacy J, MD °[phone] °[phone] °  °

## 2021-10-06 ENCOUNTER — Other Ambulatory Visit: Payer: Self-pay

## 2021-10-06 ENCOUNTER — Ambulatory Visit (INDEPENDENT_AMBULATORY_CARE_PROVIDER_SITE_OTHER): Payer: Medicare HMO

## 2021-10-06 DIAGNOSIS — Z7901 Long term (current) use of anticoagulants: Secondary | ICD-10-CM

## 2021-10-06 LAB — POCT INR: INR: 2 (ref 2.0–3.0)

## 2021-10-06 NOTE — Progress Notes (Addendum)
Continue 1 tablet daily. Recheck in 1 wk due to chemotherapy treatment.  Pt reported starting chemotherapy, tamoxifen, which she is aware will interact with warfarin. Pt reported her oncology provider advised her she is at increased risk of stroke while taking tamoxifen. Advised pt to watch for signs and symptoms of bleeding and abnormal bruising and if this occurs to contact the office or go to the ER. Educated pt on signs and symptoms of stroke and DVT and PE and provided written educational material.  Pt verbalized understanding.

## 2021-10-06 NOTE — Patient Instructions (Addendum)
Pre visit review using our clinic review tool, if applicable. No additional management support is needed unless otherwise documented below in the visit note.  Continue 1 tablet daily. Recheck in 1 wk due to chemotherapy treatment. Advised pt to watch for signs and symptoms of bleeding and abnormal bruising and if this occurs to contact the office or go to the ER.

## 2021-10-07 NOTE — Progress Notes (Signed)
Remote pacemaker transmission.   

## 2021-10-13 ENCOUNTER — Other Ambulatory Visit: Payer: Self-pay

## 2021-10-13 ENCOUNTER — Ambulatory Visit (INDEPENDENT_AMBULATORY_CARE_PROVIDER_SITE_OTHER): Payer: Medicare HMO

## 2021-10-13 DIAGNOSIS — Z7901 Long term (current) use of anticoagulants: Secondary | ICD-10-CM

## 2021-10-13 LAB — POCT INR: INR: 2.1 (ref 2.0–3.0)

## 2021-10-13 NOTE — Patient Instructions (Addendum)
Pre visit review using our clinic review tool, if applicable. No additional management support is needed unless otherwise documented below in the visit note.  Continue 1 tablet daily. Recheck in 1 wk due to chemotherapy treatment.

## 2021-10-13 NOTE — Progress Notes (Signed)
Continue 1 tablet daily. Recheck in 1 wk due to chemotherapy treatment.  Pt reported starting chemotherapy, tamoxifen, which she is aware will interact with warfarin. Pt reported her oncology provider advised her she is at increased risk of stroke while taking tamoxifen. Advised pt to watch for signs and symptoms of bleeding and abnormal bruising and if this occurs to contact the office or go to the ER. Educated pt on signs and symptoms of stroke and DVT and PE and provided written educational material.  Pt verbalized understanding.

## 2021-10-14 ENCOUNTER — Other Ambulatory Visit: Payer: Self-pay | Admitting: Hematology

## 2021-10-14 DIAGNOSIS — C180 Malignant neoplasm of cecum: Secondary | ICD-10-CM

## 2021-10-20 ENCOUNTER — Other Ambulatory Visit: Payer: Self-pay

## 2021-10-20 ENCOUNTER — Ambulatory Visit (INDEPENDENT_AMBULATORY_CARE_PROVIDER_SITE_OTHER): Payer: Medicare HMO

## 2021-10-20 DIAGNOSIS — Z7901 Long term (current) use of anticoagulants: Secondary | ICD-10-CM

## 2021-10-20 LAB — POCT INR: INR: 2 (ref 2.0–3.0)

## 2021-10-20 NOTE — Progress Notes (Signed)
Pt reported starting chemotherapy at the end of January, tamoxifen, which she is aware will interact with warfarin. Pt reported her oncology provider advised her she is at increased risk of stroke while taking tamoxifen. Pt has been consistently on the lowest end of her INR range for the last 3 weeks and reports not eating any greens. Advised pt it would be best for her to be closer to 2.5 or 3 for her INR due to the risk of stroke with the chemotherapy. Pt agreed to change her weekly dose slightly to try to increase her INR closer to midrange. She will recheck in 1 week.  Change weekly dosing to take 1 tablet daily except take 1 1/2 on Mondays. Recheck in 1 wk due to chemotherapy treatment.   Advised pt to watch for signs and symptoms of bleeding and abnormal bruising and if this occurs to contact the office or go to the ER. Educated pt on signs and symptoms of stroke and DVT and PE and provided written educational material.  Pt verbalized understanding.

## 2021-10-20 NOTE — Patient Instructions (Addendum)
Pre visit review using our clinic review tool, if applicable. No additional management support is needed unless otherwise documented below in the visit note.  Change weekly dosing to take 1 tablet daily except take 1 1/2 on Mondays. Recheck in 1 wk

## 2021-10-25 ENCOUNTER — Other Ambulatory Visit (HOSPITAL_COMMUNITY): Payer: Self-pay

## 2021-10-27 ENCOUNTER — Other Ambulatory Visit: Payer: Self-pay

## 2021-10-27 ENCOUNTER — Ambulatory Visit (INDEPENDENT_AMBULATORY_CARE_PROVIDER_SITE_OTHER): Payer: Medicare HMO

## 2021-10-27 DIAGNOSIS — Z7901 Long term (current) use of anticoagulants: Secondary | ICD-10-CM | POA: Diagnosis not present

## 2021-10-27 LAB — POCT INR: INR: 2.3 (ref 2.0–3.0)

## 2021-10-27 NOTE — Progress Notes (Signed)
Pt reported starting chemotherapy at the end of January, tamoxifen, which she is aware will interact with warfarin. Pt reported her oncology provider advised her she is at increased risk of stroke while taking tamoxifen. Pt has been consistently on the lowest range of her INR range for the last 3 weeks and reports not eating any greens. Advised pt it would be best for her to be closer to 2.5 or 3 for her INR due to the risk with the chemotherapy. Pt agreed to change her weekly dose slightly to try to increase her INR closer to midrange.  Today pt reported she is still not eating greens but would like to. Her INR today is 2.3 but with her adding greens to her diet her INR will likely return to 2.0 or become subtherapeutic. Pt is concerned due to her oncologist told her the chemotherapy she is taking can increase risk of stroke. Agreed to increase one more day in her dosing by 1/2 tablet and to recheck in 1 week.  Change weekly dosing to take 1 tablet daily except take 1 1/2 on Mondays and Thursday. Recheck in 1 wk due to chemotherapy treatment.   Advised pt to watch for signs and symptoms of bleeding and abnormal bruising and if this occurs to contact the office or go to the ER. Educated pt on signs and symptoms of stroke and DVT and PE and provided written educational material.  Pt verbalized understanding.

## 2021-10-27 NOTE — Patient Instructions (Addendum)
Pre visit review using our clinic review tool, if applicable. No additional management support is needed unless otherwise documented below in the visit note.  Change weekly dosing to take 1 tablet daily except take 1 1/2 on Mondays and Thursday. Recheck in 1 wk

## 2021-10-31 ENCOUNTER — Ambulatory Visit (INDEPENDENT_AMBULATORY_CARE_PROVIDER_SITE_OTHER): Payer: Medicare HMO

## 2021-10-31 DIAGNOSIS — I495 Sick sinus syndrome: Secondary | ICD-10-CM

## 2021-11-01 ENCOUNTER — Telehealth: Payer: Self-pay

## 2021-11-01 ENCOUNTER — Ambulatory Visit (INDEPENDENT_AMBULATORY_CARE_PROVIDER_SITE_OTHER): Payer: Medicare HMO

## 2021-11-01 DIAGNOSIS — Z950 Presence of cardiac pacemaker: Secondary | ICD-10-CM | POA: Diagnosis not present

## 2021-11-01 DIAGNOSIS — I5022 Chronic systolic (congestive) heart failure: Secondary | ICD-10-CM | POA: Diagnosis not present

## 2021-11-01 LAB — CUP PACEART REMOTE DEVICE CHECK
Battery Remaining Longevity: 6 mo
Battery Remaining Percentage: 6 %
Battery Voltage: 2.8 V
Date Time Interrogation Session: 20230227040012
Implantable Lead Implant Date: 20130913
Implantable Lead Implant Date: 20130913
Implantable Lead Implant Date: 20150303
Implantable Lead Location: 753858
Implantable Lead Location: 753859
Implantable Lead Location: 753860
Implantable Lead Model: 5076
Implantable Lead Model: 5092
Implantable Pulse Generator Implant Date: 20150303
Lead Channel Impedance Value: 390 Ohm
Lead Channel Impedance Value: 460 Ohm
Lead Channel Pacing Threshold Amplitude: 0.875 V
Lead Channel Pacing Threshold Amplitude: 1.125 V
Lead Channel Pacing Threshold Pulse Width: 0.4 ms
Lead Channel Pacing Threshold Pulse Width: 0.6 ms
Lead Channel Sensing Intrinsic Amplitude: 6.1 mV
Lead Channel Setting Pacing Amplitude: 2 V
Lead Channel Setting Pacing Amplitude: 2.125
Lead Channel Setting Pacing Pulse Width: 0.4 ms
Lead Channel Setting Pacing Pulse Width: 0.6 ms
Lead Channel Setting Sensing Sensitivity: 3 mV
Pulse Gen Model: 3242
Pulse Gen Serial Number: 7548835

## 2021-11-01 NOTE — Telephone Encounter (Signed)
Remote ICM transmission received.  Attempted call to patient regarding ICM remote transmission and left detailed message per DPR.  Advised to return call for any fluid symptoms or questions. Next ICM remote transmission scheduled 12/05/2021.   ? ?

## 2021-11-01 NOTE — Progress Notes (Signed)
EPIC Encounter for ICM Monitoring  Patient Name: Anita Duke is a 81 y.o. female Date: 11/01/2021 Primary Care Physican: Binnie Rail, MD Primary Cardiologist: Varanasi/McLean Electrophysiologist: Allred Bi-V Pacing:  54%         09/02/2021 Office Weight: 143 lbs         Battery Longevity: 6.0 months                  Attempted call to patient and unable to reach.  Left detailed message per DPR regarding transmission. Transmission reviewed.    Corvue Thoracic impedance suggesting fluid levels returned to normal.   Prescribed:  Jardiance 25 mg take 1 tablet daily Spironolactone 25 mg take 1 tablet daily   Labs: 09/20/2021 Creatinine 0.58, BUN 19, Potassium 3.9, Sodium 141, GFR 84.71 09/13/2021 Creatinine 0.62, BUN 12, Potassium 3.8, Sodium 140, GFR >60 09/02/2021 Creatinine 0.52, BUN 13, Potassium 4.0, Sodium 139, GFR >60 06/30/2021 Creatinine 0.71, BUN 13, Potassium 3.6, Sodium 142 A complete set of results can be found in Results Review.   Recommendations:   Left voice mail with ICM number and encouraged to call if experiencing any fluid symptoms.   Follow-up plan: ICM clinic phone appointment on 12/05/2021.   91 day device clinic remote transmission 12/27/2021.   EP/Cardiology Office Visits:   Recall 03/01/2022 with [facility] NP/PA. Recall 07/06/2022 with Oda Kilts, PA   Copy of ICM check sent to Dr. Rayann Heman.    3 month ICM trend: 10/31/2021.    12-14 Month ICM trend:     [name], RN 11/01/2021 3:57 PM

## 2021-11-04 ENCOUNTER — Ambulatory Visit (INDEPENDENT_AMBULATORY_CARE_PROVIDER_SITE_OTHER): Payer: Medicare HMO

## 2021-11-04 ENCOUNTER — Other Ambulatory Visit: Payer: Self-pay

## 2021-11-04 DIAGNOSIS — Z7901 Long term (current) use of anticoagulants: Secondary | ICD-10-CM

## 2021-11-04 LAB — POCT INR: INR: 1.8 — AB (ref 2.0–3.0)

## 2021-11-04 NOTE — Patient Instructions (Addendum)
Pre visit review using our clinic review tool, if applicable. No additional management support is needed unless otherwise documented below in the visit note. ? ?Increase dose to take 1 1/2  tablets today and then change weekly dosing to take 1 tablet daily except take 1 1/2 on Mondays, Wednesday, and Friday. Recheck in 1 wk due to chemotherapy treatment.  ?

## 2021-11-04 NOTE — Progress Notes (Addendum)
Spoke with patient and discussed remote transmission.  She is feeling fine.  Also discussed what to expect when battery alert occurs and the timeframe estimated at 6 months. No changes and encouraged to call if experiencing any fluid symptoms.

## 2021-11-04 NOTE — Progress Notes (Signed)
Increase dose to take 1 1/2  tablets today and then change weekly dosing to take 1 tablet daily except take 1 1/2 on Mondays, Wednesday, and Friday. Recheck in 1 wk due to chemotherapy treatment.  ?

## 2021-11-07 NOTE — Addendum Note (Signed)
Addended by: Cheri Kearns A on: 11/07/2021 01:24 PM   Modules accepted: Level of Service

## 2021-11-07 NOTE — Progress Notes (Signed)
Remote pacemaker transmission.   

## 2021-11-11 ENCOUNTER — Ambulatory Visit (INDEPENDENT_AMBULATORY_CARE_PROVIDER_SITE_OTHER): Payer: Medicare HMO

## 2021-11-11 ENCOUNTER — Other Ambulatory Visit: Payer: Self-pay

## 2021-11-11 ENCOUNTER — Ambulatory Visit: Payer: Medicare HMO

## 2021-11-11 DIAGNOSIS — Z7901 Long term (current) use of anticoagulants: Secondary | ICD-10-CM

## 2021-11-11 LAB — POCT INR: INR: 2.3 (ref 2.0–3.0)

## 2021-11-11 NOTE — Progress Notes (Addendum)
Continue 1 tablet daily except take 1 1/2 on Mondays, Wednesday, and Friday. Recheck in 2 weeks, on 3/28. ? ?Advised pt to watch for signs and symptoms of bleeding and abnormal bruising and if this occurs to contact the office or go to the ER. Educated pt on signs and symptoms of stroke and DVT and PE and provided written educational material.  ?Pt verbalized understanding.  ?

## 2021-11-11 NOTE — Patient Instructions (Addendum)
Pre visit review using our clinic review tool, if applicable. No additional management support is needed unless otherwise documented below in the visit note. ? ?Continue 1 tablet daily except take 1 1/2 on Mondays, Wednesday, and Friday. Recheck in 2 weeks, on 3/28. ?

## 2021-11-11 NOTE — Progress Notes (Signed)
Patient ID: Anita Duke, female   DOB: 05-26-1941, 81 y.o.   MRN: [id number] ? ?Medical screening examination/treatment/procedure(s) were performed by non-physician practitioner and as supervising physician I was immediately available for consultation/collaboration.  I agree with above. Cathlean Cower, MD ? ?

## 2021-11-16 ENCOUNTER — Other Ambulatory Visit (HOSPITAL_COMMUNITY): Payer: Self-pay

## 2021-11-18 ENCOUNTER — Other Ambulatory Visit (HOSPITAL_COMMUNITY): Payer: Self-pay

## 2021-11-29 ENCOUNTER — Ambulatory Visit (INDEPENDENT_AMBULATORY_CARE_PROVIDER_SITE_OTHER): Payer: Medicare HMO

## 2021-11-29 DIAGNOSIS — Z7901 Long term (current) use of anticoagulants: Secondary | ICD-10-CM | POA: Diagnosis not present

## 2021-11-29 LAB — POCT INR: INR: 2.8 (ref 2.0–3.0)

## 2021-11-29 NOTE — Progress Notes (Signed)
Continue 1 tablet daily except take 1 1/2 on Mondays, Wednesday, and Friday. Recheck in 3 weeks. ?

## 2021-11-29 NOTE — Patient Instructions (Addendum)
Pre visit review using our clinic review tool, if applicable. No additional management support is needed unless otherwise documented below in the visit note. ? ?Continue 1 tablet daily except take 1 1/2 on Mondays, Wednesday, and Friday. Recheck in 3 weeks. ?

## 2021-12-01 ENCOUNTER — Encounter (HOSPITAL_COMMUNITY): Payer: Self-pay

## 2021-12-01 ENCOUNTER — Ambulatory Visit (HOSPITAL_COMMUNITY): Admission: RE | Admit: 2021-12-01 | Payer: Medicare HMO | Source: Ambulatory Visit

## 2021-12-05 ENCOUNTER — Ambulatory Visit (INDEPENDENT_AMBULATORY_CARE_PROVIDER_SITE_OTHER): Payer: Medicare HMO

## 2021-12-05 DIAGNOSIS — Z950 Presence of cardiac pacemaker: Secondary | ICD-10-CM | POA: Diagnosis not present

## 2021-12-05 DIAGNOSIS — I5022 Chronic systolic (congestive) heart failure: Secondary | ICD-10-CM

## 2021-12-05 NOTE — Progress Notes (Signed)
EPIC Encounter for ICM Monitoring ? ?Patient Name: Anita Duke is a 81 y.o. female ?Date: 12/05/2021 ?Primary Care Physican: Binnie Rail, MD ?Primary Cardiologist: Varanasi/McLean ?Electrophysiologist: Allred ?Bi-V Pacing:  95%         ?09/02/2021 Office Weight: 143 lbs       ?  ?Battery Longevity: 4.8 months                ?  ?Attempted call to patient and unable to reach.  Left detailed message per DPR regarding transmission. Transmission reviewed.  ?  ?Corvue Thoracic impedance suggesting possible fluid accumulation starting 3/29 and also 3/16-3/26. ?  ?Prescribed:  ?Jardiance 25 mg take 1 tablet daily ?Spironolactone 25 mg take 1 tablet daily ?  ?Labs: ?09/20/2021 Creatinine 0.58, BUN 19, Potassium 3.9, Sodium 141, GFR 84.71 ?09/13/2021 Creatinine 0.62, BUN 12, Potassium 3.8, Sodium 140, GFR >60 ?09/02/2021 Creatinine 0.52, BUN 13, Potassium 4.0, Sodium 139, GFR >60 ?06/30/2021 Creatinine 0.71, BUN 13, Potassium 3.6, Sodium 142 ?A complete set of results can be found in Results Review. ?  ?Recommendations:   Left voice mail with ICM number and encouraged to call if experiencing any fluid symptoms. ?  ?Follow-up plan: ICM clinic phone appointment on 12/13/2021 to recheck fluid levels.   91 day device clinic remote transmission 12/27/2021. ?  ?EP/Cardiology Office Visits:   03/03/2022 with [facility] NP/PA.    Recall 07/06/2022 with Oda Kilts, PA ?  ?Copy of ICM check sent to Dr. Rayann Heman.   Will send copy to Dr Aundra Dubin for review if patient is reached.   ? ?3 month ICM trend: 12/05/2021. ? ? ? ?12-14 Month ICM trend:  ? ? ? ?[name], RN ?12/05/2021 ?8:38 AM ? ?

## 2021-12-06 ENCOUNTER — Telehealth: Payer: Self-pay

## 2021-12-06 NOTE — Progress Notes (Signed)
Attempted return call to patient and unable to reach.  Left message was returning call. ? ?

## 2021-12-06 NOTE — Telephone Encounter (Signed)
Remote ICM transmission received.  Attempted call to patient regarding ICM remote transmission and left detailed message per DPR.  Advised to return call for any fluid symptoms or questions. Next ICM remote transmission scheduled 12/13/2021.   ? ?

## 2021-12-06 NOTE — Progress Notes (Signed)
Attempted return call to patient as requested by voice mail message.  Tried cell and home phone and unable to reach.  Left message to return call. ?

## 2021-12-07 NOTE — Progress Notes (Signed)
Spoke with patient and heart failure questions reviewed.  Pt asymptomatic for fluid accumulation.  She eats lunch at restaurants daily and [facility]  There is no limitation to salt intake.  Pt no longer prescribed diuretic other than Spironolactone.  Weight stable at 142-143 lbs.  Discussed battery ERI and what to expect when it alerts.  ? ?Copy sent to Dr Aundra Dubin for review and recommendations if needed.   ?

## 2021-12-09 ENCOUNTER — Other Ambulatory Visit (HOSPITAL_COMMUNITY): Payer: Self-pay

## 2021-12-09 MED ORDER — FUROSEMIDE 20 MG PO TABS
ORAL_TABLET | ORAL | 3 refills | Status: DC
  Filled 2021-12-09: qty 30, 30d supply, fill #0

## 2021-12-09 NOTE — Progress Notes (Signed)
Spoke with patient and she continues to be asymptomatic for fluid accumulation.  Advised Dr Aundra Dubin ordered Furosemide 20 mg take 1 tablet daily x 3 days and then take as needed.  Pt is currently out of town and will start prescription on 4/13 as recommended.  Will recheck fluid levels on 4/17 to allow time to take med as prescribed when she returns home.   She verbalized understanding.  Prescription sent to preferred pharmacy with note to pharmacy she will pick up on 4/13. ?

## 2021-12-09 NOTE — Addendum Note (Signed)
Addended by: [name] on: 12/09/2021 10:36 AM ? ? Modules accepted: Orders ? ?

## 2021-12-09 NOTE — Progress Notes (Signed)
Anita Dresser, MD  Aneesha Holloran Panda, RN ?Can take Lasix 20 mg daily x 3 days then use prn.  ?

## 2021-12-15 ENCOUNTER — Other Ambulatory Visit (HOSPITAL_COMMUNITY): Payer: Self-pay

## 2021-12-19 ENCOUNTER — Ambulatory Visit (INDEPENDENT_AMBULATORY_CARE_PROVIDER_SITE_OTHER): Payer: Medicare HMO

## 2021-12-19 DIAGNOSIS — I5022 Chronic systolic (congestive) heart failure: Secondary | ICD-10-CM | POA: Diagnosis not present

## 2021-12-19 DIAGNOSIS — Z950 Presence of cardiac pacemaker: Secondary | ICD-10-CM | POA: Diagnosis not present

## 2021-12-20 ENCOUNTER — Ambulatory Visit (INDEPENDENT_AMBULATORY_CARE_PROVIDER_SITE_OTHER): Payer: Medicare HMO

## 2021-12-20 DIAGNOSIS — Z7901 Long term (current) use of anticoagulants: Secondary | ICD-10-CM | POA: Diagnosis not present

## 2021-12-20 LAB — POCT INR: INR: 3.3 — AB (ref 2.0–3.0)

## 2021-12-20 NOTE — Progress Notes (Signed)
EPIC Encounter for ICM Monitoring ? ?Patient Name: Golda Zavalza is a 81 y.o. female ?Date: 12/20/2021 ?Primary Care Physican: Binnie Rail, MD ?Primary Cardiologist: Varanasi/McLean ?Electrophysiologist: Allred ?Bi-V Pacing:  95%         ?12/05/2021 Weight: 142-143 lbs       ?  ?Battery Longevity: 4.7 months                ?  ?Spoke with patient and heart failure questions reviewed.  Pt reports feeling well at this time and voices no complaints.  ?  ?Corvue Thoracic impedance suggesting fluid levels returned to normal after taking PRN Furosemide.  Impedance suggesting possible fluid accumulation from 3/16-4/12. ?  ?Prescribed:  ?Furosemide 20 mg take 1 tablet as needed. ?Jardiance 25 mg take 1 tablet daily ?Spironolactone 25 mg take 1 tablet daily ?  ?Labs: ?09/20/2021 Creatinine 0.58, BUN 19, Potassium 3.9, Sodium 141, GFR 84.71 ?09/13/2021 Creatinine 0.62, BUN 12, Potassium 3.8, Sodium 140, GFR >60 ?09/02/2021 Creatinine 0.52, BUN 13, Potassium 4.0, Sodium 139, GFR >60 ?06/30/2021 Creatinine 0.71, BUN 13, Potassium 3.6, Sodium 142 ?A complete set of results can be found in Results Review. ?  ?Recommendations:  No changes and encouraged to call if experiencing any fluid symptoms. ?  ?Follow-up plan: ICM clinic phone appointment on 12/13/2021 to recheck fluid levels.   91 day device clinic remote transmission 12/27/2021. ?  ?EP/Cardiology Office Visits:   03/03/2022 with [facility] NP/PA.    Recall 07/06/2022 with Oda Kilts, PA ?  ?Copy of ICM check sent to Dr. Rayann Heman.   ? ?3 month ICM trend: 12/19/2021. ? ? ? ?12-14 Month ICM trend:  ? ? ? ?[name], RN ?12/20/2021 ?2:30 PM ? ?

## 2021-12-20 NOTE — Patient Instructions (Addendum)
Pre visit review using our clinic review tool, if applicable. No additional management support is needed unless otherwise documented below in the visit note. ? ?Hold dose today and then continue 1 tablet daily except take 1 1/2 on Mondays, Wednesday, and Friday. Recheck in 2 weeks. ?

## 2021-12-20 NOTE — Progress Notes (Addendum)
Hold dose today and then continue 1 tablet daily except take 1 1/2 on Mondays, Wednesday, and Friday. Recheck in 2 weeks. ?

## 2021-12-27 ENCOUNTER — Ambulatory Visit (INDEPENDENT_AMBULATORY_CARE_PROVIDER_SITE_OTHER): Payer: Medicare HMO

## 2021-12-27 DIAGNOSIS — I5022 Chronic systolic (congestive) heart failure: Secondary | ICD-10-CM

## 2021-12-27 DIAGNOSIS — I495 Sick sinus syndrome: Secondary | ICD-10-CM

## 2021-12-28 LAB — CUP PACEART REMOTE DEVICE CHECK
Battery Remaining Longevity: 4 mo
Battery Remaining Percentage: 4 %
Battery Voltage: 2.75 V
Date Time Interrogation Session: 20230424040013
Implantable Lead Implant Date: 20130913
Implantable Lead Implant Date: 20130913
Implantable Lead Implant Date: 20150303
Implantable Lead Location: 753858
Implantable Lead Location: 753859
Implantable Lead Location: 753860
Implantable Lead Model: 5076
Implantable Lead Model: 5092
Implantable Pulse Generator Implant Date: 20150303
Lead Channel Impedance Value: 390 Ohm
Lead Channel Impedance Value: 410 Ohm
Lead Channel Pacing Threshold Amplitude: 0.75 V
Lead Channel Pacing Threshold Amplitude: 0.875 V
Lead Channel Pacing Threshold Pulse Width: 0.4 ms
Lead Channel Pacing Threshold Pulse Width: 0.6 ms
Lead Channel Sensing Intrinsic Amplitude: 6 mV
Lead Channel Setting Pacing Amplitude: 2 V
Lead Channel Setting Pacing Amplitude: 2 V
Lead Channel Setting Pacing Pulse Width: 0.4 ms
Lead Channel Setting Pacing Pulse Width: 0.6 ms
Lead Channel Setting Sensing Sensitivity: 3 mV
Pulse Gen Model: 3242
Pulse Gen Serial Number: 7548835

## 2021-12-29 ENCOUNTER — Inpatient Hospital Stay: Payer: Medicare HMO | Attending: Nurse Practitioner | Admitting: Hematology

## 2021-12-29 ENCOUNTER — Other Ambulatory Visit (HOSPITAL_COMMUNITY): Payer: Self-pay

## 2021-12-29 ENCOUNTER — Inpatient Hospital Stay: Payer: Medicare HMO

## 2021-12-29 ENCOUNTER — Encounter: Payer: Self-pay | Admitting: Hematology

## 2021-12-29 VITALS — BP 158/67 | HR 70 | Temp 98.0°F | Resp 17 | Wt 153.2 lb

## 2021-12-29 DIAGNOSIS — Z79899 Other long term (current) drug therapy: Secondary | ICD-10-CM | POA: Diagnosis not present

## 2021-12-29 DIAGNOSIS — F32A Depression, unspecified: Secondary | ICD-10-CM | POA: Diagnosis not present

## 2021-12-29 DIAGNOSIS — Z85038 Personal history of other malignant neoplasm of large intestine: Secondary | ICD-10-CM | POA: Diagnosis not present

## 2021-12-29 DIAGNOSIS — M8580 Other specified disorders of bone density and structure, unspecified site: Secondary | ICD-10-CM | POA: Diagnosis not present

## 2021-12-29 DIAGNOSIS — C50112 Malignant neoplasm of central portion of left female breast: Secondary | ICD-10-CM

## 2021-12-29 DIAGNOSIS — C180 Malignant neoplasm of cecum: Secondary | ICD-10-CM

## 2021-12-29 DIAGNOSIS — Z170 Estrogen receptor positive status [ER+]: Secondary | ICD-10-CM | POA: Diagnosis not present

## 2021-12-29 DIAGNOSIS — Z79810 Long term (current) use of selective estrogen receptor modulators (SERMs): Secondary | ICD-10-CM | POA: Insufficient documentation

## 2021-12-29 DIAGNOSIS — R69 Illness, unspecified: Secondary | ICD-10-CM | POA: Diagnosis not present

## 2021-12-29 LAB — CBC WITH DIFFERENTIAL/PLATELET
Abs Immature Granulocytes: 0 10*3/uL (ref 0.00–0.07)
Basophils Absolute: 0.1 10*3/uL (ref 0.0–0.1)
Basophils Relative: 1 %
Eosinophils Absolute: 0.1 10*3/uL (ref 0.0–0.5)
Eosinophils Relative: 1 %
HCT: 41.2 % (ref 36.0–46.0)
Hemoglobin: 13.2 g/dL (ref 12.0–15.0)
Immature Granulocytes: 0 %
Lymphocytes Relative: 29 %
Lymphs Abs: 1.4 10*3/uL (ref 0.7–4.0)
MCH: 29.9 pg (ref 26.0–34.0)
MCHC: 32 g/dL (ref 30.0–36.0)
MCV: 93.2 fL (ref 80.0–100.0)
Monocytes Absolute: 0.6 10*3/uL (ref 0.1–1.0)
Monocytes Relative: 13 %
Neutro Abs: 2.7 10*3/uL (ref 1.7–7.7)
Neutrophils Relative %: 56 %
Platelets: 161 10*3/uL (ref 150–400)
RBC: 4.42 MIL/uL (ref 3.87–5.11)
RDW: 13.5 % (ref 11.5–15.5)
WBC: 4.9 10*3/uL (ref 4.0–10.5)
nRBC: 0 % (ref 0.0–0.2)

## 2021-12-29 LAB — CMP (CANCER CENTER ONLY)
ALT: 10 U/L (ref 0–44)
AST: 14 U/L — ABNORMAL LOW (ref 15–41)
Albumin: 3.9 g/dL (ref 3.5–5.0)
Alkaline Phosphatase: 56 U/L (ref 38–126)
Anion gap: 3 — ABNORMAL LOW (ref 5–15)
BUN: 15 mg/dL (ref 8–23)
CO2: 29 mmol/L (ref 22–32)
Calcium: 8.9 mg/dL (ref 8.9–10.3)
Chloride: 109 mmol/L (ref 98–111)
Creatinine: 0.61 mg/dL (ref 0.44–1.00)
GFR, Estimated: >60 mL/min (ref >60)
Glucose, Bld: 169 mg/dL — ABNORMAL HIGH (ref 70–99)
Potassium: 4 mmol/L (ref 3.5–5.1)
Sodium: 141 mmol/L (ref 135–145)
Total Bilirubin: 0.7 mg/dL (ref 0.3–1.2)
Total Protein: 6.3 g/dL — ABNORMAL LOW (ref 6.5–8.1)

## 2021-12-29 LAB — CEA (IN HOUSE-CHCC): CEA (CHCC-In House): 3.45 ng/mL (ref 0.00–5.00)

## 2021-12-29 MED ORDER — TAMOXIFEN CITRATE 20 MG PO TABS
20.0000 mg | ORAL_TABLET | Freq: Every day | ORAL | 1 refills | Status: DC
  Filled 2021-12-29 – 2022-01-16 (×2): qty 90, 90d supply, fill #0
  Filled 2022-04-11: qty 90, 90d supply, fill #1

## 2021-12-29 NOTE — Progress Notes (Signed)
?[place]   ?Telephone:(336) [phone] [phone]   ?Clinic Follow up Note  ? ?Patient Care Team: ?Binnie Rail, MD as PCP - General (Internal Medicine) ?Larey Dresser, MD as PCP - Cardiology (Cardiology) ?Carlena Bjornstad, MD as Consulting Physician (Cardiology) ?Inda Castle, MD (Inactive) as Consulting Physician (Gastroenterology) ?Cameron Sprang, MD as Consulting Physician (Neurology) ?Truitt Merle, MD as Consulting Physician (Hematology) ?Stark Klein, MD as Consulting Physician (General Surgery) ?Mauro Kaufmann, RN as Oncology Nurse Navigator ?Rockwell Germany, RN as Oncology Nurse Navigator ?Alla Feeling, NP as Nurse Practitioner (Nurse Practitioner) ? ?Date of Service:  12/29/2021 ? ?CHIEF COMPLAINT: f/u of left breast cancer ? ?CURRENT THERAPY:  ?Tamoxifen, started 09/13/21 ? ?ASSESSMENT & PLAN:  ?Anita Duke is a 81 y.o. post-hysterectomy female with  ? ?1. 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 Otherwise, no mutations. ?-she underwent lumpectomy on 05/26/21 with Dr. Barry Dienes showing: IDC, 1.5 cm, grade 2; DCIS, intermediate grade; resection margins negative. ?-she began tamoxifen on 09/13/21. She reports weight gain and hot flashes. ?-she is clinically doing well from a breast cancer standpoint. Labs reviewed, overall no concern. Physical exam was unremarkable. There is no clinical concern for recurrence. ?-she will be due for annual mammogram in 03/2022; I ordered for her today. ?-Continue tamoxifen, we discussed healthy diet and exercise, and weight watch. ?  ?2. Osteopenia ?-Her most recent DEXA was 03/31/21 showing -1.4. ?-We discussed tamoxifen can strengthen the bones. ? ?3. Depression ?-she previously  weaned off Wellbutrin in order to start tamoxifen. However, she did not start a different medication, thus her depression worsened. ?-NP Lacie started her on Effexor on 10/04/21. I advised she can increase her dose if needed. ?  ?4. H/o Cecal Adenocarcinoma, stage I p(T2, N0) ?-found on screening colonoscopy ?-s/p partial colectomy on 12/03/12 by Dr. Barry Dienes ?-evaluated by Dr. Jana Hakim, no further treatment needed. ?  ?  ?PLAN:  ?-continue tamoxifen ?-mammogram due 03/2022 ?-lab and f/u with NP Lacie in 6 months ? ? ? ?No problem-specific Assessment & Plan notes found for this encounter. ? ? ?SUMMARY OF ONCOLOGIC HISTORY: ?Oncology History Overview Note  ?Cancer Staging ?Malignant neoplasm of central portion of left breast ([place]) ?Staging form: Breast, AJCC 8th Edition ?- Clinical stage from 04/11/2021: Stage IA (cT1c, cN0, cM0, G2, ER+, PR+, HER2-) - Signed by Truitt Merle, MD on 04/27/2021 ? ?  ?Malignant neoplasm of central portion of left breast [facility])  ?04/09/2021 Mammogram  ? Bilateral Diagnostic, and Left Breast US ? ?FINDINGS: ?Spiculated mass in the left breast containing calcifications at 3 o'clock, 6 cm from the nipple measuring 11 x 9 x 9 mm. No axillary adenopathy. ?  ?  ?04/11/2021 Cancer Staging  ? Staging form: Breast, AJCC 8th Edition ?- Clinical stage from 04/11/2021: Stage IA (cT1c, cN0, cM0, G2, ER+, PR+, HER2-) - Signed by Truitt Merle, MD on 04/27/2021 ?Stage prefix: Initial diagnosis ?Histologic grading system: 3 grade system ? ?  ?04/11/2021 Pathology Results  ? Diagnosis ?Breast, left, needle core biopsy, 3:00 ?- INVASIVE DUCTAL CARCINOMA, SEE COMMENT. ?- DUCTAL CARCINOMA IN SITU. ?Microscopic Comment ?The carcinoma appears grade 2. ? ?PROGNOSTIC INDICATORS ?Results: ?IMMUNOHISTOCHEMICAL AND MORPHOMETRIC ANALYSIS PERFORMED MANUALLY ?The tumor cells are NEGATIVE for Her2 (1+). ?Estrogen  Receptor: 100%, POSITIVE, STRONG STAINING INTENSITY ?Progesterone Receptor: 100%, POSITIVE, STRONG STAINING INTENSITY ?Proliferation  Marker Ki67: 5% ?  ?04/27/2021 Initial Diagnosis  ? Malignant neoplasm of central portion of left breast ([place]) ? ?  ?05/17/2021 Genetic Testing  ? Hereditary cancer genetic testing: carrier result.  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  No other pathogenic variants detected in [facility] CancerNext-Expanded +RNAinsight Panel. The report date is May 17, 2021.   ? ?The CancerNext-Expanded gene panel offered by [facility] and includes sequencing, rearrangement, and RNA analysis for the following 77 genes: AIP, ALK, APC, ATM, AXIN2, BAP1, BARD1, BLM, BMPR1A, BRCA1, BRCA2, BRIP1, CDC73, CDH1, CDK4, CDKN1B, CDKN2A, CHEK2, CTNNA1, DICER1, FANCC, FH, FLCN, GALNT12, KIF1B, LZTR1, MAX, MEN1, MET, MLH1, MSH2, MSH3, MSH6, MUTYH, NBN, NF1, NF2, NTHL1, PALB2, PHOX2B, PMS2, POT1, PRKAR1A, PTCH1, PTEN, RAD51C, RAD51D, RB1, RECQL, RET, SDHA, SDHAF2, SDHB, SDHC, SDHD, SMAD4, SMARCA4, SMARCB1, SMARCE1, STK11, SUFU, TMEM127, TP53, TSC1, TSC2, VHL and XRCC2 (sequencing and deletion/duplication); EGFR, EGLN1, HOXB13, KIT, MITF, PDGFRA, POLD1, and POLE (sequencing only); EPCAM and GREM1 (deletion/duplication only).  ?  ?05/26/2021 Definitive Surgery  ? FINAL MICROSCOPIC DIAGNOSIS:  ? ?A. BREAST, LEFT, LUMPECTOMY:  ?- Invasive ductal carcinoma, 1.5 cm, grade 2  ?- Ductal carcinoma in situ, intermediate grade  ?- Resection margins are negative for carcinoma; closest is the posterior margin at 0.3 cm  ?- Patchy atypical ductal hyperplasia  ?- Fibrocystic change with usual ductal hyperplasia, apocrine metaplasia and calcifications  ?- Biopsy site changes  ?- See oncology table  ?  ?05/26/2021 Cancer Staging  ? Staging form: Breast, AJCC 8th Edition ?- Pathologic stage from 05/26/2021: Stage Unknown (pT1c, pNX, cM0, G2, ER+, PR+, HER2-) - Signed by Truitt Merle, MD on 06/30/2021 ?Stage prefix: Initial diagnosis ?Multigene prognostic  tests performed: None ?Histologic grading system: 3 grade system ?Residual tumor (R): R0 - None ? ?  ?10/04/2021 Survivorship  ? SCP delivered in person by Cira Rue, NP ?  ? ? ? ?INTERVAL HISTORY:  ?Shaleen Talamantez is here for a follow up of breast cancer. She was last seen by me on 06/30/21 with survivorship in the interim. She presents to the clinic alone. ?She reports she is doing well overall. She has gained 7 lbs since January. She notes she is not walking as much recently. She also reports hot flashes from tamoxifen. She notes she doesn't sleep well, which is unchanged from her baseline. ?  ?All other systems were reviewed with the patient and are negative. ? ?MEDICAL HISTORY:  ?Past Medical History:  ?Diagnosis Date  ? Acquired complete AV block   ? AV node ablation May 28, 2012   ? Allergy   ? mild- uses claritin   ? Anemia   ? Hemoglobin 10.4, December, 2013  ? Arthritis   ? "not bad; little in my hands; some in my knees" (11/04/2013)  ? B12 deficiency 03/16/2020  ? Bilateral sensorineural hearing loss 03/29/2017  ? Breast cancer ([place])   ? CAD (coronary artery disease)   ? LIMA to the LAD at time of mitral valve repair / LIMA atretic,, February, 2011  ? Cataracts, bilateral   ? removed bilat   ? Cecal cancer 2014  ? colon  ? CHF (congestive heart failure)   ? 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 Clotting disorder   ? 21 yrs ago blood clot in heart   ? Colon cancer ([place])   ?  Colon polyp, hyperplastic   ? Diabetes mellitus type II, controlled 03/02/2009  ? Diabetic neuropathy 02/21/2018  ? [facility] - [facility]  ? Diverticulosis of colon   ? Diverticulosis of large intestine 08/04/2002  ? Ejection fraction < 50%   ? EF 30%, New diagnosis   february, 2011, etiology not clear, consider rate related tachycardia, and not use carvedilol, catheterization no constriction, /    EF 55-60% echo, June, 2011 /     Beta blocker stopped May, 2012 with reactive airway disease.   Patient does not tolerate metoprolol.  Carvedilol stopped  /   EF 55%, echo, February, 2012  //   EF 40-45%, septal dyssynergy, hypokinesis of the a  ? Essential hypertension 07/07/2008  ? Family history

## 2022-01-02 ENCOUNTER — Telehealth: Payer: Self-pay | Admitting: Hematology

## 2022-01-02 NOTE — Telephone Encounter (Signed)
Left message with follow-up appointment per 4/27 los. ?

## 2022-01-03 ENCOUNTER — Ambulatory Visit (INDEPENDENT_AMBULATORY_CARE_PROVIDER_SITE_OTHER): Payer: Medicare HMO

## 2022-01-03 DIAGNOSIS — Z7901 Long term (current) use of anticoagulants: Secondary | ICD-10-CM | POA: Diagnosis not present

## 2022-01-03 LAB — POCT INR: INR: 2.2 (ref 2.0–3.0)

## 2022-01-03 NOTE — Progress Notes (Signed)
Continue 1 tablet daily except take 1 1/2 on Mondays, Wednesday, and Friday. Recheck in 2 weeks. ?

## 2022-01-03 NOTE — Patient Instructions (Addendum)
Pre visit review using our clinic review tool, if applicable. No additional management support is needed unless otherwise documented below in the visit note. ? ?Continue 1 tablet daily except take 1 1/2 on Mondays, Wednesday, and Friday. Recheck in 2 weeks. ?

## 2022-01-04 DIAGNOSIS — C50912 Malignant neoplasm of unspecified site of left female breast: Secondary | ICD-10-CM | POA: Diagnosis not present

## 2022-01-05 ENCOUNTER — Ambulatory Visit (INDEPENDENT_AMBULATORY_CARE_PROVIDER_SITE_OTHER): Payer: Medicare HMO

## 2022-01-05 DIAGNOSIS — I495 Sick sinus syndrome: Secondary | ICD-10-CM

## 2022-01-06 LAB — CUP PACEART REMOTE DEVICE CHECK
Battery Remaining Longevity: 4 mo
Battery Remaining Percentage: 4 %
Battery Voltage: 2.75 V
Date Time Interrogation Session: 20230505094137
Implantable Lead Implant Date: 20130913
Implantable Lead Implant Date: 20130913
Implantable Lead Implant Date: 20150303
Implantable Lead Location: 753858
Implantable Lead Location: 753859
Implantable Lead Location: 753860
Implantable Lead Model: 5076
Implantable Lead Model: 5092
Implantable Pulse Generator Implant Date: 20150303
Lead Channel Impedance Value: 430 Ohm
Lead Channel Impedance Value: 440 Ohm
Lead Channel Pacing Threshold Amplitude: 0.875 V
Lead Channel Pacing Threshold Amplitude: 1.25 V
Lead Channel Pacing Threshold Pulse Width: 0.4 ms
Lead Channel Pacing Threshold Pulse Width: 0.6 ms
Lead Channel Sensing Intrinsic Amplitude: 5.6 mV
Lead Channel Setting Pacing Amplitude: 2 V
Lead Channel Setting Pacing Amplitude: 2.25 V
Lead Channel Setting Pacing Pulse Width: 0.4 ms
Lead Channel Setting Pacing Pulse Width: 0.6 ms
Lead Channel Setting Sensing Sensitivity: 3 mV
Pulse Gen Model: 3242
Pulse Gen Serial Number: 7548835

## 2022-01-09 ENCOUNTER — Ambulatory Visit (INDEPENDENT_AMBULATORY_CARE_PROVIDER_SITE_OTHER): Payer: Medicare HMO

## 2022-01-09 DIAGNOSIS — Z950 Presence of cardiac pacemaker: Secondary | ICD-10-CM | POA: Diagnosis not present

## 2022-01-09 DIAGNOSIS — I5022 Chronic systolic (congestive) heart failure: Secondary | ICD-10-CM | POA: Diagnosis not present

## 2022-01-12 ENCOUNTER — Telehealth: Payer: Self-pay

## 2022-01-12 NOTE — Progress Notes (Signed)
Remote pacemaker transmission.   

## 2022-01-12 NOTE — Telephone Encounter (Signed)
Remote ICM transmission received.  Attempted call to patient regarding ICM remote transmission and left detailed message per DPR.  Advised to return call for any fluid symptoms or questions. Next ICM remote transmission scheduled 02/13/2022.   ? ?

## 2022-01-12 NOTE — Progress Notes (Signed)
EPIC Encounter for ICM Monitoring ? ?Patient Name: Anita Duke is a 81 y.o. female ?Date: 01/12/2022 ?Primary Care Physican: Binnie Rail, MD ?Primary Cardiologist: Varanasi/McLean ?Electrophysiologist: Allred ?Bi-V Pacing:  96%         ?12/05/2021 Weight: 142-143 lbs       ?  ?Battery Longevity: 3.8 months                ?  ?Attempted call to patient and unable to reach.  Left detailed message per DPR regarding transmission. Transmission reviewed.   ?  ?Corvue Thoracic impedance suggesting normal but was suggesting possible fluid accumulation from 4/19-4/26. ?  ?Prescribed:  ?Furosemide 20 mg take 1 tablet as needed. ?Jardiance 25 mg take 1 tablet daily ?Spironolactone 25 mg take 1 tablet daily ?  ?Labs: ?12/29/2021 Creatinine 0.61, BUN 15, Potassium 4.0, Sodium 141, GFR >60 ?09/20/2021 Creatinine 0.58, BUN 19, Potassium 3.9, Sodium 141, GFR 84.71 ?09/13/2021 Creatinine 0.62, BUN 12, Potassium 3.8, Sodium 140, GFR >60 ?A complete set of results can be found in Results Review. ?  ?Recommendations:  Left voice mail with ICM number and encouraged to call if experiencing any fluid symptoms. ?  ?Follow-up plan: ICM clinic phone appointment on 02/13/2022.   91 day device clinic remote transmission 03/28/2022. ?  ?EP/Cardiology Office Visits:   03/03/2022 with [facility] NP/PA.    Recall 07/06/2022 with Oda Kilts, PA ?  ?Copy of ICM check sent to Dr. Rayann Heman.   ? ?3 month ICM trend: 01/09/2022. ? ? ? ?12-14 Month ICM trend:  ? ? ? ?[name], RN ?01/12/2022 ?3:25 PM ? ?

## 2022-01-17 ENCOUNTER — Ambulatory Visit (INDEPENDENT_AMBULATORY_CARE_PROVIDER_SITE_OTHER): Payer: Medicare HMO

## 2022-01-17 ENCOUNTER — Other Ambulatory Visit (HOSPITAL_COMMUNITY): Payer: Self-pay

## 2022-01-17 DIAGNOSIS — I4891 Unspecified atrial fibrillation: Secondary | ICD-10-CM

## 2022-01-17 DIAGNOSIS — Z7901 Long term (current) use of anticoagulants: Secondary | ICD-10-CM

## 2022-01-17 LAB — POCT INR: INR: 3.7 — AB (ref 2.0–3.0)

## 2022-01-17 NOTE — Progress Notes (Signed)
Hold dose today and then change weekly dose to take 1 tablet daily except take 1 1/2 on Mondays and Friday. Recheck in 3 weeks. ? ?Advised pt to watch for signs and symptoms of bleeding and abnormal bruising and if this occurs to contact the office or go to the ER. Educated pt on signs and symptoms of stroke and DVT and PE and provided written educational material.  ?

## 2022-01-17 NOTE — Patient Instructions (Addendum)
Pre visit review using our clinic review tool, if applicable. No additional management support is needed unless otherwise documented below in the visit note. ? ?Hold dose today and then change weekly dose to take 1 tablet daily except take 1 1/2 on Mondays and Friday. Recheck in 3 weeks ?

## 2022-01-18 NOTE — Addendum Note (Signed)
Addended by: Douglass Rivers D on: 01/18/2022 11:41 AM ? ? Modules accepted: Level of Service ? ?

## 2022-01-18 NOTE — Progress Notes (Signed)
Agree with management.  Sumaiya Arruda J Londan Coplen, MD  

## 2022-01-18 NOTE — Progress Notes (Signed)
Remote pacemaker transmission.   

## 2022-02-07 ENCOUNTER — Ambulatory Visit (INDEPENDENT_AMBULATORY_CARE_PROVIDER_SITE_OTHER): Payer: Medicare HMO

## 2022-02-07 DIAGNOSIS — I495 Sick sinus syndrome: Secondary | ICD-10-CM

## 2022-02-07 DIAGNOSIS — Z7901 Long term (current) use of anticoagulants: Secondary | ICD-10-CM | POA: Diagnosis not present

## 2022-02-07 LAB — POCT INR: INR: 3.3 — AB (ref 2.0–3.0)

## 2022-02-07 NOTE — Progress Notes (Signed)
Hold dose today and then change weekly dose to take 1 tablet daily except take 1 1/2 on Mondays. Recheck in 3 weeks.

## 2022-02-07 NOTE — Patient Instructions (Addendum)
Pre visit review using our clinic review tool, if applicable. No additional management support is needed unless otherwise documented below in the visit note.  Hold dose today and then change weekly dose to take 1 tablet daily except take 1 1/2 on Mondays. Recheck in 3 weeks.

## 2022-02-09 LAB — CUP PACEART REMOTE DEVICE CHECK
Battery Remaining Longevity: 3 mo
Battery Remaining Percentage: 3 %
Battery Voltage: 2.71 V
Date Time Interrogation Session: 20230608000517
Implantable Lead Implant Date: 20130913
Implantable Lead Implant Date: 20130913
Implantable Lead Implant Date: 20150303
Implantable Lead Location: 753858
Implantable Lead Location: 753859
Implantable Lead Location: 753860
Implantable Lead Model: 5076
Implantable Lead Model: 5092
Implantable Pulse Generator Implant Date: 20150303
Lead Channel Impedance Value: 400 Ohm
Lead Channel Impedance Value: 440 Ohm
Lead Channel Pacing Threshold Amplitude: 0.875 V
Lead Channel Pacing Threshold Amplitude: 0.875 V
Lead Channel Pacing Threshold Pulse Width: 0.4 ms
Lead Channel Pacing Threshold Pulse Width: 0.6 ms
Lead Channel Sensing Intrinsic Amplitude: 5.8 mV
Lead Channel Setting Pacing Amplitude: 2 V
Lead Channel Setting Pacing Amplitude: 2 V
Lead Channel Setting Pacing Pulse Width: 0.4 ms
Lead Channel Setting Pacing Pulse Width: 0.6 ms
Lead Channel Setting Sensing Sensitivity: 3 mV
Pulse Gen Model: 3242
Pulse Gen Serial Number: 7548835

## 2022-02-13 ENCOUNTER — Ambulatory Visit (INDEPENDENT_AMBULATORY_CARE_PROVIDER_SITE_OTHER): Payer: Medicare HMO

## 2022-02-13 DIAGNOSIS — Z950 Presence of cardiac pacemaker: Secondary | ICD-10-CM | POA: Diagnosis not present

## 2022-02-13 DIAGNOSIS — I5022 Chronic systolic (congestive) heart failure: Secondary | ICD-10-CM | POA: Diagnosis not present

## 2022-02-14 NOTE — Progress Notes (Signed)
EPIC Encounter for ICM Monitoring  Patient Name: Anita Duke is a 81 y.o. female Date: 02/14/2022 Primary Care Physican: Binnie Rail, MD Primary Cardiologist: Varanasi/McLean Electrophysiologist: Allred Bi-V Pacing:  96%         12/05/2021 Weight: 142-143 lbs 02/14/2022 Weight: Not weighing at home         Battery Longevity: 2.8 months                  Spoke with patient and heart failure questions reviewed.  Pt asymptomatic for fluid accumulation.  Reports feeling well at this time and voices no complaints.     Corvue Thoracic impedance suggesting possible fluid accumulation starting 6/7.   Prescribed:  Furosemide 20 mg take 1 tablet as needed. Jardiance 25 mg take 1 tablet daily Spironolactone 25 mg take 1 tablet daily   Labs: 12/29/2021 Creatinine 0.61, BUN 15, Potassium 4.0, Sodium 141, GFR >60 09/20/2021 Creatinine 0.58, BUN 19, Potassium 3.9, Sodium 141, GFR 84.71 09/13/2021 Creatinine 0.62, BUN 12, Potassium 3.8, Sodium 140, GFR >60 A complete set of results can be found in Results Review.   Recommendations:  Advised to take PRN Furosemide 20 mg x 2 days and then return to PRN.     Follow-up plan: ICM clinic phone appointment on 02/20/2022 to recheck fluid levels.   91 day device clinic remote transmission 03/28/2022.   EP/Cardiology Office Visits:   03/03/2022 with [facility] NP/PA.    Recall 07/06/2022 with Oda Kilts, PA   Copy of ICM check sent to Dr. Rayann Heman.    3 month ICM trend: 02/13/2022.    12-14 Month ICM trend:     [name], RN 02/14/2022 10:00 AM

## 2022-02-20 ENCOUNTER — Ambulatory Visit (INDEPENDENT_AMBULATORY_CARE_PROVIDER_SITE_OTHER): Payer: Medicare HMO

## 2022-02-20 ENCOUNTER — Other Ambulatory Visit (HOSPITAL_COMMUNITY): Payer: Self-pay

## 2022-02-20 DIAGNOSIS — Z950 Presence of cardiac pacemaker: Secondary | ICD-10-CM

## 2022-02-20 DIAGNOSIS — I5022 Chronic systolic (congestive) heart failure: Secondary | ICD-10-CM

## 2022-02-21 ENCOUNTER — Telehealth: Payer: Self-pay

## 2022-02-21 NOTE — Telephone Encounter (Signed)
Remote ICM transmission received.  Attempted call to patient regarding ICM remote transmission and left detailed message per DPR.  Advised to return call for any fluid symptoms or questions. Next ICM remote transmission scheduled 03/20/2022.    

## 2022-02-21 NOTE — Progress Notes (Signed)
EPIC Encounter for ICM Monitoring  Patient Name: Anita Duke is a 81 y.o. female Date: 02/21/2022 Primary Care Physican: Binnie Rail, MD Primary Cardiologist: Varanasi/McLean Electrophysiologist: Allred Bi-V Pacing:  96%         12/05/2021 Weight: 142-143 lbs 02/14/2022 Weight: Not weighing at home         Battery Longevity: 2.8 months                  Attempted call to patient and unable to reach.  Left detailed message per DPR regarding transmission. Transmission reviewed.      Corvue Thoracic impedance suggesting fluid levels returned to normal after taking PRN Furosemide x 2 days.   Prescribed:  Furosemide 20 mg take 1 tablet as needed. Jardiance 25 mg take 1 tablet daily Spironolactone 25 mg take 1 tablet daily   Labs: 12/29/2021 Creatinine 0.61, BUN 15, Potassium 4.0, Sodium 141, GFR >60 09/20/2021 Creatinine 0.58, BUN 19, Potassium 3.9, Sodium 141, GFR 84.71 09/13/2021 Creatinine 0.62, BUN 12, Potassium 3.8, Sodium 140, GFR >60 A complete set of results can be found in Results Review.   Recommendations:  Left voice mail with ICM number and encouraged to call if experiencing any fluid symptoms.    Follow-up plan: ICM clinic phone appointment on 03/20/2022.   91 day device clinic remote transmission 03/28/2022.   EP/Cardiology Office Visits:   03/03/2022 with [facility] NP/PA.    Recall 07/06/2022 with Oda Kilts, PA   Copy of ICM check sent to Dr. Rayann Heman.     3 month ICM trend: 02/20/2022.    12-14 Month ICM trend:     [name], RN 02/21/2022 4:46 PM

## 2022-02-22 NOTE — Addendum Note (Signed)
Addended by: Cheri Kearns A on: 02/22/2022 10:11 AM   Modules accepted: Level of Service

## 2022-02-22 NOTE — Progress Notes (Signed)
Remote pacemaker transmission.   

## 2022-02-24 ENCOUNTER — Encounter: Payer: Self-pay | Admitting: Internal Medicine

## 2022-02-28 ENCOUNTER — Ambulatory Visit (INDEPENDENT_AMBULATORY_CARE_PROVIDER_SITE_OTHER): Payer: Medicare HMO

## 2022-02-28 DIAGNOSIS — Z7901 Long term (current) use of anticoagulants: Secondary | ICD-10-CM

## 2022-02-28 LAB — POCT INR: INR: 2.4 (ref 2.0–3.0)

## 2022-03-01 NOTE — Progress Notes (Signed)
Advanced Heart Failure Clinic Note    Primary Care: Dr. Billey Duke HF Cardiologist: Dr. Aundra Duke EP: Dr. Rayann Duke   HPI: Anita Duke is a 81 y.o. with a past medical history of pulmonary HTN, mitral regurgitation s/p mitral valve repair, MAZE procedure at the [facility] in 2001, at that time she also had a LIMA to LAD graft placed. Also with history of permanent Afib on warfarin s/p AVN ablation. At one time she was on Tikosyn which was stopped for QT prolongation.  She has chronic systolic CHF (EF 01% in 0/2725).  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 Last cath in 2014 with atretic LIMA to LAD but nonobstructive disease in LAD.   Echo in 10/18 showed EF improved to 55-60% with mild to moderate RV dysfunction.   Echo in 7/20 showed EF 50% with basal inferior and inferolateral akinesis, mildly decreased RV systolic function, s/p MV repair with trivial MR, no MS, normal IVC.  Echo in 1/22 showed EF 45-50% with basal inferior and basal inferolateral akinesis, mildly decreased RV systolic function, s/p MV repair with trivial MR and mean gradient 4 mmHg.   Patient developed breast cancer in 2022 and is status post lumpectomy.  Echo 12/22 showed EF 45-50%, diffuse hypokinesis, mildly decreased RV function, repaired mitral valve with trivial MR no MS, normal IVC.   Today she returns for HF follow up. Overall feeling fine. She has mild dyspnea with carrying heavy bags. Currently living in an assisted living facility. She works in her flower garden without significant dyspnea. Denies palpitations, abnormal bleeding, CP, dizziness, edema, or PND/Orthopnea. Appetite ok. No fever or chills. She does not weigh regularly at home. Taking all medications. Took lasix last week after Anita Duke notified her of fluid on device.  ECG (personally reviewed): v-paced 75 bpm  Labs (5/18): K 4.7, creatinine 0.62, LDL 71, HDL 61 Labs (10/18): K 3.5, creatinine 0.59, TSH normal Labs (7/20): K 3.9,  creatinine 0.69, hgb 15.1, LDL 37, TSH normal Labs (7/21): K 3.8, creatinine 0.64, LDL 52, HDL 64 Labs (1/22): K 4.1, creatinine 0.68, LDL 40 Labs (7/22): LDL 47 Labs (10/22): K 3.6, creatinine 0.71 Labs (1/23): LDL 60, HDL 59 Labs (4/23): K 4.0, creatinine 0.61, hgb 13.2  St Jude device interrogation (personally reviewed): stable thoracic impedance, 96% BiV pacing  ROS: All systems reviewed and negative except as per HPI.  PMH: 1. Type II diabetes. 2. COPD 3. Depression 4. H/o colon cancer 5. CAD: LIMA-LAD in 4/01 with MV repair.   - LHC (8/14): LIMA-LAD atretic, 50-60% mid LAD stenosis.  6. Mitral regurgitation: S/p MV repair at the [facility] in 4/01.  She also had LAA ligation and MAZE. Echo in 7/20 with trivial MR and no MS.  7. 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   - St Jude CRT-P device.  - Echo (4/16): EF 40%, mild LV dilation, normal RV size with mild to moderately decreased systolic function, s/p mitral valve repair with mild MR, no significant stenosis.  - Echo (10/18): EF 55-60%, severe LAE, RV with mild to moderate systolic dysfunction, mild-moderate TR, PASP 35 mmHg.  - Echo (7/20): EF 50% with basal inferior and inferolateral akinesis, mildly decreased RV systolic function, s/p MV repair with trivial MR, no MS, normal IVC.  - Echo (1/22): EF 45-50% with basal inferior and basal inferolateral akinesis, mildly decreased RV systolic function, s/p MV repair with trivial MR and mean gradient 4 mmHg. - Echo (12/22): EF 45-50%, diffuse  hypokinesis, mildly decreased RV function, repaired mitral valve with trivial MR no MS, normal IVC.  8. Permanent atrial fibrillation: S/p MAZE in 4/01.  S/p AV nodal ablation in 9/13.   9. PFTs (1/19): No significant obstruction or restriction in the lungs.  10. Breast cancer: S/p lumpectomy in 2022.   ROS: All systems reviewed and negative except as per HPI.   Current Outpatient Medications  Medication Sig Dispense  Refill   ACCU-CHEK SOFTCLIX LANCETS lancets USE TO CHECK BLOOD SUGARS  TWICE A DAY 200 each 2   atorvastatin (LIPITOR) 40 MG tablet TAKE 1 TABLET BY MOUTH  DAILY 90 tablet 3   Blood Glucose Monitoring Suppl (ACCU-CHEK AVIVA PLUS) w/Device KIT Use to check blood sugars daily Dx E11.9 1 kit 0   carvedilol (COREG) 12.5 MG tablet TAKE 1 TABLET BY MOUTH  TWICE DAILY WITH A MEAL 180 tablet 3   cholecalciferol (VITAMIN D) 1000 UNITS tablet Take 1,000 Units by mouth daily.      fish oil-omega-3 fatty acids 1000 MG capsule Take 1 g by mouth 2 (two) times daily.     furosemide (LASIX) 20 MG tablet Take 20 mg by mouth as needed for fluid.     glucose blood (ACCU-CHEK AVIVA PLUS) test strip USE 2 TIMES DAILY AS  DIRECTED 200 each 2   JARDIANCE 25 MG TABS tablet TAKE 1 TABLET BY MOUTH  DAILY 90 tablet 3   loratadine (CLARITIN) 10 MG tablet Take 10 mg by mouth daily.     Polyethyl Glycol-Propyl Glycol (SYSTANE) 0.4-0.3 % SOLN Place 1 drop into both eyes every 6 (six) hours as needed (dry eyes).     sacubitril-valsartan (ENTRESTO) 24-26 MG Take 1 tablet by mouth 2 (two) times daily. 60 tablet 6   sodium chloride (OCEAN) 0.65 % SOLN nasal spray Place 1 spray into both nostrils 2 (two) times daily as needed for congestion.     spironolactone (ALDACTONE) 25 MG tablet TAKE 1 TABLET BY MOUTH  DAILY 90 tablet 3   tamoxifen (NOLVADEX) 20 MG tablet Take 1 tablet by mouth daily. 90 tablet 1   venlafaxine (EFFEXOR) 37.5 MG tablet Take 1 tablet by mouth 2 times daily. 30 tablet 3   warfarin (COUMADIN) 2.5 MG tablet TAKE 1 TABLET BY MOUTH DAILY OR AS DIRECTED BY ANTICOAGULATION  CLINIC 104 tablet 3   No current facility-administered medications for this encounter.   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: [employer]   Tobacco Use   Smoking status: Never   Smokeless tobacco: Never  Vaping Use   Vaping Use: Never used  Substance and Sexual Activity   Alcohol use: No   Drug use: No   Sexual activity: Not Currently    Birth control/protection: Surgical  Other Topics Concern   Not on file  Social History Narrative  No caffeine   Right handed    Lives alone    Social Determinants of Health   Financial Resource Strain: Not on file  Food Insecurity: Not on file  Transportation Needs: Not on file  Physical Activity: Not on file  Stress: Not on file  Social Connections: Not on file  Intimate Partner Violence: Not on file   Family History  Problem Relation Age of Onset   Diabetes Mother    Kidney disease Mother    Thyroid disease Mother    Alzheimer's disease Mother        Symptom onset in late 36s; confirmed via autopsy   Depression Mother    Heart disease Father    Diabetes Father    Heart disease Sister        x 2   Colon cancer Maternal Aunt        dx 63s; mother's paternal half sister   Colon cancer Maternal Aunt        dx late 44s   Heart disease Paternal Uncle        x 6   Cancer Maternal Grandfather        unknown type; dx unknown age   Diabetes Paternal Grandmother    Diabetes Paternal Grandfather    Colon cancer Cousin    Brain cancer Nephew 21   Colon polyps Neg Hx    Adrenal disorder Neg Hx    Esophageal cancer Neg Hx    Rectal cancer Neg Hx    Stomach cancer Neg Hx    BP 100/60   Pulse 70   Wt 66.1 kg (145 lb 12.8 oz)   SpO2 95%   BMI 26.67 kg/m   Wt Readings from Last 3 Encounters:   03/03/22 66.1 kg (145 lb 12.8 oz)  12/29/21 69.5 kg (153 lb 3 oz)  10/04/21 66 kg (145 lb 9.6 oz)   PHYSICAL EXAM: General:  NAD. No resp difficulty HEENT: Normal Neck: Supple. No JVD. Carotids 2+ bilat; no bruits. No lymphadenopathy or thryomegaly appreciated. Cor: PMI nondisplaced. Regular rate & rhythm. No rubs, gallops or murmurs. Lungs: Clear Abdomen: Soft, nontender, nondistended. No hepatosplenomegaly. No bruits or masses. Good bowel sounds. Extremities: No cyanosis, clubbing, rash, edema Neuro: Alert & oriented x 3, cranial nerves grossly intact. Moves all 4 extremities w/o difficulty. Affect pleasant.  ASSESSMENT & PLAN: 1. Chronic systolic CHF: EF 60% in 1093. NICM most likely.  She has LIMA-LAD that is atretic, but no flow limiting disease in the LAD.  Cardiomyopathy may be due to RV pacing after AV nodal ablation in 9/13. She now has St Jude CRT-P device and EF was up to 55-60% on 10/18 echo. Echo in 7/20 showed EF about 50%.  Echo in 1/22 and in 12/22 showed stable EF 45-50%.  NYHA class I-II symptoms.  She is not volume overloaded on exam or by Corevue.  - Continue Coreg 12.5 mg bid.  - Continue Jardiance.  - Continue Entresto 24/26 bid. Recent labs reviewed and stable, SCr 0.61, K 4.0 - She is doing fine off Lasix, not volume overloaded.   - Continue spironolactone 25 mg daily.   2. CAD: LIMA-LAD at time of MV surgery in 4/01, but LIMA-LAD atretic on 8/14 cath.  However, at that time her LAD had moderate stenosis that did not appear flow-limiting (50-60%).  No chest pain.  - Continue atorvastatin 40 mg daily. Good LDL in 1/23.  - No ASA given stable CAD with use of warfarin 3.  Complete heart block: She had an AV nodal ablation.  Now s/p CRT-P.   4. Permanent atrial fibrillation: Now s/p AV nodal ablation and BiV pacing. - Continue warfarin for anticoagulation. No bleeding issues. INR followed by Coumadin Clinic. - She could swich anticoagulation to Eliquis, data suggests  ok to use DOAC with valve repair, bioprosthetic valve.  However, she wants to continue warfarin as she has been on it stably x years.  5. DM: Continue Jardiance.  6. S/p MV repair: Stable mitral valve repair on 12/22 echo.    Follow up in 6 months with Dr. Aundra Duke.   [place], FNP 03/03/22

## 2022-03-03 ENCOUNTER — Encounter (HOSPITAL_COMMUNITY): Payer: Self-pay

## 2022-03-03 ENCOUNTER — Ambulatory Visit (HOSPITAL_COMMUNITY)
Admission: RE | Admit: 2022-03-03 | Discharge: 2022-03-03 | Disposition: A | Discharge status: Home / Self Care | Payer: Medicare HMO | Source: Ambulatory Visit | Attending: Family Medicine | Admitting: Family Medicine

## 2022-03-03 VITALS — BP 100/60 | HR 70 | Wt 145.8 lb

## 2022-03-03 DIAGNOSIS — R9431 Abnormal electrocardiogram [ECG] [EKG]: Secondary | ICD-10-CM | POA: Insufficient documentation

## 2022-03-03 DIAGNOSIS — I4821 Permanent atrial fibrillation: Secondary | ICD-10-CM | POA: Diagnosis not present

## 2022-03-03 DIAGNOSIS — E119 Type 2 diabetes mellitus without complications: Secondary | ICD-10-CM | POA: Diagnosis not present

## 2022-03-03 DIAGNOSIS — I2510 Atherosclerotic heart disease of native coronary artery without angina pectoris: Secondary | ICD-10-CM | POA: Diagnosis not present

## 2022-03-03 DIAGNOSIS — R0600 Dyspnea, unspecified: Secondary | ICD-10-CM | POA: Diagnosis not present

## 2022-03-03 DIAGNOSIS — Z952 Presence of prosthetic heart valve: Secondary | ICD-10-CM | POA: Insufficient documentation

## 2022-03-03 DIAGNOSIS — Z7984 Long term (current) use of oral hypoglycemic drugs: Secondary | ICD-10-CM | POA: Diagnosis not present

## 2022-03-03 DIAGNOSIS — I4891 Unspecified atrial fibrillation: Secondary | ICD-10-CM

## 2022-03-03 DIAGNOSIS — Z950 Presence of cardiac pacemaker: Secondary | ICD-10-CM | POA: Diagnosis not present

## 2022-03-03 DIAGNOSIS — I5022 Chronic systolic (congestive) heart failure: Secondary | ICD-10-CM | POA: Diagnosis not present

## 2022-03-03 DIAGNOSIS — Z9889 Other specified postprocedural states: Secondary | ICD-10-CM

## 2022-03-03 DIAGNOSIS — Z79899 Other long term (current) drug therapy: Secondary | ICD-10-CM | POA: Insufficient documentation

## 2022-03-03 DIAGNOSIS — I442 Atrioventricular block, complete: Secondary | ICD-10-CM | POA: Diagnosis not present

## 2022-03-03 DIAGNOSIS — Z7901 Long term (current) use of anticoagulants: Secondary | ICD-10-CM | POA: Diagnosis not present

## 2022-03-03 DIAGNOSIS — Z853 Personal history of malignant neoplasm of breast: Secondary | ICD-10-CM | POA: Insufficient documentation

## 2022-03-03 DIAGNOSIS — I272 Pulmonary hypertension, unspecified: Secondary | ICD-10-CM | POA: Insufficient documentation

## 2022-03-03 DIAGNOSIS — Z7902 Long term (current) use of antithrombotics/antiplatelets: Secondary | ICD-10-CM | POA: Insufficient documentation

## 2022-03-03 NOTE — Patient Instructions (Signed)
Thank you for coming in today  Your physician recommends that you schedule a follow-up appointment in:  6  months with Dr. Aundra Dubin    At the [facility], you and your health needs are our priority. As part of our continuing mission to provide you with exceptional heart care, we have created designated Provider Care Teams. These Care Teams include your primary Cardiologist (physician) and Advanced Practice Providers (APPs- Physician Assistants and Nurse Practitioners) who all work together to provide you with the care you need, when you need it.   You may see any of the following providers on your designated Care Team at your next follow up: Dr Glori Bickers Dr Haynes Kerns, NP Lyda Jester, [place] [facility] [place], [place] Audry Riles, PharmD   Please be sure to bring in all your medications bottles to every appointment.   If you have any questions or concerns before your next appointment please send [name] a message through [place] or call our office at [phone].    TO LEAVE A MESSAGE FOR THE NURSE SELECT OPTION 2, PLEASE LEAVE A MESSAGE INCLUDING: YOUR NAME DATE OF BIRTH CALL BACK NUMBER REASON FOR CALL**this is important as we prioritize the call backs  YOU WILL RECEIVE A CALL BACK THE SAME DAY AS LONG AS YOU CALL BEFORE 4:00 PM

## 2022-03-08 ENCOUNTER — Encounter: Payer: Medicare Other | Admitting: Psychology

## 2022-03-14 ENCOUNTER — Encounter: Payer: Medicare Other | Admitting: Psychology

## 2022-03-14 ENCOUNTER — Other Ambulatory Visit (HOSPITAL_COMMUNITY): Payer: Self-pay | Admitting: *Deleted

## 2022-03-14 NOTE — Telephone Encounter (Signed)
Pt called requesting meds be sent to a new pharmacy. I called pt back to find out which meds needed to be filled and which pharmacy they needed to be sent to. No answer/left vm for pt to return call.

## 2022-03-15 ENCOUNTER — Other Ambulatory Visit (HOSPITAL_COMMUNITY): Payer: Self-pay | Admitting: *Deleted

## 2022-03-16 ENCOUNTER — Other Ambulatory Visit (HOSPITAL_COMMUNITY): Payer: Self-pay | Admitting: *Deleted

## 2022-03-16 MED ORDER — SPIRONOLACTONE 25 MG PO TABS: 25.0000 mg | ORAL_TABLET | Freq: Every day | ORAL | 3 refills | Status: DC

## 2022-03-16 MED ORDER — CARVEDILOL 12.5 MG PO TABS: 12.5000 mg | ORAL_TABLET | Freq: Two times a day (BID) | ORAL | 3 refills | Status: DC

## 2022-03-16 MED ORDER — ATORVASTATIN CALCIUM 40 MG PO TABS: 40.0000 mg | ORAL_TABLET | Freq: Every day | ORAL | 3 refills | Status: DC

## 2022-03-20 ENCOUNTER — Ambulatory Visit (INDEPENDENT_AMBULATORY_CARE_PROVIDER_SITE_OTHER): Payer: Medicare HMO

## 2022-03-20 ENCOUNTER — Other Ambulatory Visit: Payer: Self-pay | Admitting: Internal Medicine

## 2022-03-20 DIAGNOSIS — Z950 Presence of cardiac pacemaker: Secondary | ICD-10-CM | POA: Diagnosis not present

## 2022-03-20 DIAGNOSIS — I5022 Chronic systolic (congestive) heart failure: Secondary | ICD-10-CM | POA: Diagnosis not present

## 2022-03-22 ENCOUNTER — Telehealth: Payer: Self-pay

## 2022-03-22 NOTE — Telephone Encounter (Signed)
Remote ICM transmission received.  Attempted call to patient regarding ICM remote transmission and left detailed message per DPR.  Advised to return call for any fluid symptoms or questions. Next ICM remote transmission scheduled 04/24/2022.

## 2022-03-22 NOTE — Progress Notes (Signed)
EPIC Encounter for ICM Monitoring  Patient Name: Anita Duke is a 81 y.o. female Date: 03/22/2022 Primary Care Physican: Binnie Rail, MD Primary Cardiologist: Varanasi/McLean Electrophysiologist: Allred Bi-V Pacing:  96%         12/05/2021 Weight: 142-143 lbs 02/14/2022 Weight: Not weighing at home         Battery Longevity: <3 months                  Attempted call to patient and unable to reach.  Left detailed message per DPR regarding transmission. Transmission reviewed.      Corvue Thoracic impedance suggesting normal fluid levels.   Prescribed:  Furosemide 20 mg take 1 tablet as needed. Jardiance 25 mg take 1 tablet daily Spironolactone 25 mg take 1 tablet daily   Labs: 12/29/2021 Creatinine 0.61, BUN 15, Potassium 4.0, Sodium 141, GFR >60 09/20/2021 Creatinine 0.58, BUN 19, Potassium 3.9, Sodium 141, GFR 84.71 09/13/2021 Creatinine 0.62, BUN 12, Potassium 3.8, Sodium 140, GFR >60 A complete set of results can be found in Results Review.   Recommendations:  Left voice mail with ICM number and encouraged to call if experiencing any fluid symptoms.    Follow-up plan: ICM clinic phone appointment on 04/24/2022.   91 day device clinic remote transmission 03/28/2022.   EP/Cardiology Office Visits:   08/30/2022 with Dr Aundra Dubin.    Recall 07/06/2022 with Oda Kilts, PA   Copy of ICM check sent to Dr. Rayann Heman.   3 month ICM trend: 03/20/2022.    12-14 Month ICM trend:     [name], RN 03/22/2022 1:12 PM

## 2022-03-27 ENCOUNTER — Other Ambulatory Visit (HOSPITAL_COMMUNITY): Payer: Self-pay | Admitting: *Deleted

## 2022-03-27 MED ORDER — ENTRESTO 24-26 MG PO TABS: 1.0000 | ORAL_TABLET | Freq: Two times a day (BID) | ORAL | 6 refills | Status: DC

## 2022-03-28 ENCOUNTER — Ambulatory Visit (INDEPENDENT_AMBULATORY_CARE_PROVIDER_SITE_OTHER): Payer: Medicare HMO

## 2022-03-28 ENCOUNTER — Encounter: Payer: Self-pay | Admitting: Internal Medicine

## 2022-03-28 DIAGNOSIS — I442 Atrioventricular block, complete: Secondary | ICD-10-CM

## 2022-03-28 LAB — CUP PACEART REMOTE DEVICE CHECK
Battery Remaining Longevity: 1 mo
Battery Remaining Percentage: 1 %
Battery Voltage: 2.66 V
Date Time Interrogation Session: 20230724040013
Implantable Lead Implant Date: 20130913
Implantable Lead Implant Date: 20130913
Implantable Lead Implant Date: 20150303
Implantable Lead Location: 753858
Implantable Lead Location: 753859
Implantable Lead Location: 753860
Implantable Lead Model: 5076
Implantable Lead Model: 5092
Implantable Pulse Generator Implant Date: 20150303
Lead Channel Impedance Value: 400 Ohm
Lead Channel Impedance Value: 430 Ohm
Lead Channel Pacing Threshold Amplitude: 0.75 V
Lead Channel Pacing Threshold Amplitude: 1 V
Lead Channel Pacing Threshold Pulse Width: 0.4 ms
Lead Channel Pacing Threshold Pulse Width: 0.6 ms
Lead Channel Sensing Intrinsic Amplitude: 6.1 mV
Lead Channel Setting Pacing Amplitude: 2 V
Lead Channel Setting Pacing Amplitude: 2 V
Lead Channel Setting Pacing Pulse Width: 0.4 ms
Lead Channel Setting Pacing Pulse Width: 0.6 ms
Lead Channel Setting Sensing Sensitivity: 3 mV
Pulse Gen Model: 3242
Pulse Gen Serial Number: 7548835

## 2022-03-28 NOTE — Patient Instructions (Addendum)
     Monitor your BP at home daily.    Blood work was ordered.     Medications changes include :   None    Return in about 6 months (around 09/29/2022) for Physical Exam.

## 2022-03-28 NOTE — Progress Notes (Unsigned)
Subjective:    Patient ID: Anita Duke, female    DOB: [birth year], 81 y.o.   MRN: [id number]     HPI Anita Duke is here for follow up of her chronic medical problems, including CAD, HFrEF,  afib, htn, hld, DM depression  She walks her dog.      Medications and allergies reviewed with patient and updated if appropriate.  Current Outpatient Medications on File Prior to Visit  Medication Sig Dispense Refill   ACCU-CHEK SOFTCLIX LANCETS lancets USE TO CHECK BLOOD SUGARS  TWICE A DAY 200 each 2   atorvastatin (LIPITOR) 40 MG tablet Take 1 tablet (40 mg total) by mouth daily. 90 tablet 3   Blood Glucose Monitoring Suppl (ACCU-CHEK AVIVA PLUS) w/Device KIT Use to check blood sugars daily Dx E11.9 1 kit 0   carvedilol (COREG) 12.5 MG tablet Take 1 tablet (12.5 mg total) by mouth 2 (two) times daily with a meal. 180 tablet 3   cholecalciferol (VITAMIN D) 1000 UNITS tablet Take 1,000 Units by mouth daily.      fish oil-omega-3 fatty acids 1000 MG capsule Take 1 g by mouth 2 (two) times daily.     furosemide (LASIX) 20 MG tablet Take 20 mg by mouth as needed for fluid.     glucose blood (ACCU-CHEK AVIVA PLUS) test strip USE 2 TIMES DAILY AS  DIRECTED 200 each 2   JARDIANCE 25 MG TABS tablet TAKE 1 TABLET BY MOUTH  DAILY 90 tablet 3   loratadine (CLARITIN) 10 MG tablet Take 10 mg by mouth daily.     Polyethyl Glycol-Propyl Glycol (SYSTANE) 0.4-0.3 % SOLN Place 1 drop into both eyes every 6 (six) hours as needed (dry eyes).     sacubitril-valsartan (ENTRESTO) 24-26 MG Take 1 tablet by mouth 2 (two) times daily. 60 tablet 6   sodium chloride (OCEAN) 0.65 % SOLN nasal spray Place 1 spray into both nostrils 2 (two) times daily as needed for congestion.     spironolactone (ALDACTONE) 25 MG tablet Take 1 tablet (25 mg total) by mouth daily. 90 tablet 3   tamoxifen (NOLVADEX) 20 MG tablet Take 1 tablet by mouth daily. 90 tablet 1   venlafaxine (EFFEXOR) 37.5 MG tablet Take 1 tablet by mouth  2 times daily. 30 tablet 3   warfarin (COUMADIN) 2.5 MG tablet TAKE 1 TABLET BY MOUTH DAILY OR AS DIRECTED BY ANTICOAGULATION  CLINIC 104 tablet 3   No current facility-administered medications on file prior to visit.     Review of Systems     Objective:  There were no vitals filed for this visit. BP Readings from Last 3 Encounters:  03/03/22 100/60  12/29/21 (!) 158/67  10/04/21 (!) 152/60   Wt Readings from Last 3 Encounters:  03/03/22 145 lb 12.8 oz (66.1 kg)  12/29/21 153 lb 3 oz (69.5 kg)  10/04/21 145 lb 9.6 oz (66 kg)   There is no height or weight on file to calculate BMI.    Physical Exam     Lab Results  Component Value Date   WBC 4.9 12/29/2021   HGB 13.2 12/29/2021   HCT 41.2 12/29/2021   PLT 161 12/29/2021   GLUCOSE 169 (H) 12/29/2021   CHOL 138 09/20/2021   TRIG 95.0 09/20/2021   HDL 59.40 09/20/2021   LDLDIRECT 65.0 01/05/2016   LDLCALC 60 09/20/2021   ALT 10 12/29/2021   AST 14 (L) 12/29/2021   NA 141 12/29/2021   K 4.0  12/29/2021   CL 109 12/29/2021   CREATININE 0.61 12/29/2021   BUN 15 12/29/2021   CO2 29 12/29/2021   TSH 0.56 09/20/2021   INR 2.4 02/28/2022   HGBA1C 7.7 (H) 09/20/2021   MICROALBUR 1.5 01/05/2016     Assessment & Plan:    See Problem List for Assessment and Plan of chronic medical problems.    

## 2022-03-29 ENCOUNTER — Ambulatory Visit (INDEPENDENT_AMBULATORY_CARE_PROVIDER_SITE_OTHER): Payer: Medicare HMO | Admitting: Internal Medicine

## 2022-03-29 VITALS — BP 100/50 | HR 69 | Temp 98.0°F | Ht 62.0 in | Wt 148.0 lb

## 2022-03-29 DIAGNOSIS — I519 Heart disease, unspecified: Secondary | ICD-10-CM

## 2022-03-29 DIAGNOSIS — R69 Illness, unspecified: Secondary | ICD-10-CM | POA: Diagnosis not present

## 2022-03-29 DIAGNOSIS — I4891 Unspecified atrial fibrillation: Secondary | ICD-10-CM | POA: Diagnosis not present

## 2022-03-29 DIAGNOSIS — E1142 Type 2 diabetes mellitus with diabetic polyneuropathy: Secondary | ICD-10-CM | POA: Diagnosis not present

## 2022-03-29 DIAGNOSIS — I2510 Atherosclerotic heart disease of native coronary artery without angina pectoris: Secondary | ICD-10-CM

## 2022-03-29 DIAGNOSIS — I10 Essential (primary) hypertension: Secondary | ICD-10-CM | POA: Diagnosis not present

## 2022-03-29 DIAGNOSIS — E1159 Type 2 diabetes mellitus with other circulatory complications: Secondary | ICD-10-CM

## 2022-03-29 DIAGNOSIS — F3289 Other specified depressive episodes: Secondary | ICD-10-CM

## 2022-03-29 DIAGNOSIS — E559 Vitamin D deficiency, unspecified: Secondary | ICD-10-CM | POA: Diagnosis not present

## 2022-03-29 DIAGNOSIS — E538 Deficiency of other specified B group vitamins: Secondary | ICD-10-CM

## 2022-03-29 DIAGNOSIS — E782 Mixed hyperlipidemia: Secondary | ICD-10-CM | POA: Diagnosis not present

## 2022-03-29 MED ORDER — WARFARIN SODIUM 2.5 MG PO TABS: ORAL_TABLET | ORAL | 3 refills | Status: DC

## 2022-03-29 NOTE — Assessment & Plan Note (Signed)
Chronic Follows with podiatry

## 2022-03-29 NOTE — Assessment & Plan Note (Addendum)
Chronic Blood pressure controlled, but on the low side today.  She is asymptomatic.  Not sure why it is on the low side, but was also low 1 month ago when she saw her heart failure doctor No change in medications since she is asymptomatic She will start monitoring her BP daily for the next week and let me know the readings CMP Continue carvedilol 12.5 mg twice daily, Entresto 24-26 mg twice daily, spironolactone 25 mg daily

## 2022-03-29 NOTE — Assessment & Plan Note (Signed)
Chronic Following with cardiology On warfarin which is monitored at our Coumadin clinic And carvedilol 12.5 mg twice daily Check CBC, CMP

## 2022-03-29 NOTE — Assessment & Plan Note (Signed)
Chronic  Lab Results  Component Value Date   HGBA1C 7.7 (H) 09/20/2021   Sugars not ideally controlled 6 months ago Check A1c, urine microalbumin today Has not tolerated Januvia or metformin Continue Jardiance 25 mg daily Stressed regular exercise, diabetic diet

## 2022-03-29 NOTE — Assessment & Plan Note (Signed)
Chronic Controlled, stable Continue venlafaxine 37.5 mg twice daily

## 2022-03-29 NOTE — Assessment & Plan Note (Signed)
Chronic Taking vitamin D daily Check vitamin D level  

## 2022-03-29 NOTE — Assessment & Plan Note (Signed)
Chronic Taking B12 Check B12 level 

## 2022-03-29 NOTE — Assessment & Plan Note (Signed)
Chronic Appears euvolemic on exam Following with heart failure clinic Entresto 24-26 mg twice daily, Jardiance 25 mg daily, Lasix 20 mg daily as needed and spironolactone 25 mg daily

## 2022-03-29 NOTE — Assessment & Plan Note (Signed)
Chronic Regular exercise and healthy diet encouraged Check lipid panel  Continue atorvastatin 40 mg daily 

## 2022-03-29 NOTE — Assessment & Plan Note (Signed)
Chronic No symptoms consistent with angina Following with cardiology Continue current medications

## 2022-03-30 ENCOUNTER — Other Ambulatory Visit (HOSPITAL_COMMUNITY): Payer: Self-pay

## 2022-03-31 ENCOUNTER — Ambulatory Visit (INDEPENDENT_AMBULATORY_CARE_PROVIDER_SITE_OTHER): Payer: Medicare HMO

## 2022-03-31 DIAGNOSIS — Z7901 Long term (current) use of anticoagulants: Secondary | ICD-10-CM

## 2022-03-31 LAB — POCT INR: INR: 2.1 (ref 2.0–3.0)

## 2022-03-31 NOTE — Patient Instructions (Addendum)
Pre visit review using our clinic review tool, if applicable. No additional management support is needed unless otherwise documented below in the visit note.  Continue 1 tablet daily except take 1 1/2 on Mondays. Recheck in 4 weeks.

## 2022-03-31 NOTE — Progress Notes (Signed)
Continue 1 tablet daily except take 1 1/2 on Mondays. Recheck in 4 weeks.  Advised pt to watch for signs and symptoms of bleeding and abnormal bruising and if this occurs to contact the office or go to the ER. Educated pt on signs and symptoms of stroke and DVT and PE and provided written educational material.  Pt verbalized understanding.

## 2022-04-04 ENCOUNTER — Telehealth: Payer: Self-pay | Admitting: Internal Medicine

## 2022-04-04 NOTE — Telephone Encounter (Signed)
LVM for pt to rtn my call to schedule AWV with [facility] call back # [phone] 

## 2022-04-06 ENCOUNTER — Ambulatory Visit: Payer: Medicare Other | Admitting: Physician Assistant

## 2022-04-12 ENCOUNTER — Other Ambulatory Visit (HOSPITAL_COMMUNITY): Payer: Self-pay

## 2022-04-13 ENCOUNTER — Telehealth: Payer: Self-pay | Admitting: Internal Medicine

## 2022-04-13 ENCOUNTER — Other Ambulatory Visit (HOSPITAL_COMMUNITY): Payer: Self-pay

## 2022-04-13 MED ORDER — AMOXICILLIN 500 MG PO CAPS
ORAL_CAPSULE | ORAL | 0 refills | Status: DC
  Filled 2022-04-13: qty 12, 7d supply, fill #0

## 2022-04-13 NOTE — Telephone Encounter (Signed)
Pt is requesting an rx for Amoxicillin (AMOXIL) 500 MG capsule. She stated she has a dentist appt coming up and Dr. Quay Burow always fills this rx for her dentist appointments. She would like rx sent to  [place] Phone:  [phone]  Fax:  [phone]      Please advise

## 2022-04-13 NOTE — Telephone Encounter (Signed)
Rx sent 

## 2022-04-24 ENCOUNTER — Ambulatory Visit (INDEPENDENT_AMBULATORY_CARE_PROVIDER_SITE_OTHER): Payer: Medicare HMO

## 2022-04-24 DIAGNOSIS — Z950 Presence of cardiac pacemaker: Secondary | ICD-10-CM

## 2022-04-24 DIAGNOSIS — I5022 Chronic systolic (congestive) heart failure: Secondary | ICD-10-CM | POA: Diagnosis not present

## 2022-04-24 NOTE — Progress Notes (Signed)
EPIC Encounter for ICM Monitoring  Patient Name: Anita Duke is a 81 y.o. female Date: 04/24/2022 Primary Care Physican: Binnie Rail, MD Primary Cardiologist: Varanasi/McLean Electrophysiologist: Allred Bi-V Pacing:  96%         12/05/2021 Weight: 142-143 lbs 02/14/2022 Weight: Not weighing at home         Battery Longevity: <3 months                  Spoke with patient and heart failure questions reviewed.  Pt asymptomatic for fluid accumulation.  Reports feeling well at this time and voices no complaints.    [place]vue Thoracic impedance suggesting normal fluid levels.   Prescribed:  Furosemide 20 mg take 1 tablet as needed. Jardiance 25 mg take 1 tablet daily Spironolactone 25 mg take 1 tablet daily   Labs: 12/29/2021 Creatinine 0.61, BUN 15, Potassium 4.0, Sodium 141, GFR >60 09/20/2021 Creatinine 0.58, BUN 19, Potassium 3.9, Sodium 141, GFR 84.71 09/13/2021 Creatinine 0.62, BUN 12, Potassium 3.8, Sodium 140, GFR >60 A complete set of results can be found in Results Review.   Recommendations: No changes and encouraged to call if experiencing any fluid symptoms.   Follow-up plan: ICM clinic phone appointment on 05/29/2022.   91 day device clinic remote transmission 06/27/2022.   EP/Cardiology Office Visits:   Recall 08/30/2022 with Dr Aundra Dubin.    Recall 07/06/2022 with Oda Kilts, PA   Copy of ICM check sent to Dr. Rayann Heman.   3 month ICM trend: 04/24/2022.    12-14 Month ICM trend:     [name], RN 04/24/2022 2:15 PM

## 2022-04-25 NOTE — Progress Notes (Signed)
Remote pacemaker transmission.   

## 2022-04-28 ENCOUNTER — Ambulatory Visit (INDEPENDENT_AMBULATORY_CARE_PROVIDER_SITE_OTHER): Payer: Medicare HMO

## 2022-04-28 DIAGNOSIS — Z7901 Long term (current) use of anticoagulants: Secondary | ICD-10-CM | POA: Diagnosis not present

## 2022-04-28 LAB — POCT INR: INR: 2.3 (ref 2.0–3.0)

## 2022-04-28 NOTE — Progress Notes (Signed)
Continue 1 tablet daily except take 1 1/2 on Mondays. Recheck in 4 weeks.  Advised pt to watch for signs and symptoms of bleeding and abnormal bruising and if this occurs to contact the office or go to the ER. Educated pt on signs and symptoms of stroke and DVT and PE and provided written educational material.  Pt verbalized understanding.

## 2022-04-28 NOTE — Patient Instructions (Addendum)
Pre visit review using our clinic review tool, if applicable. No additional management support is needed unless otherwise documented below in the visit note.  Continue 1 tablet daily except take 1 1/2 on Mondays. Recheck in 4 weeks.

## 2022-05-09 ENCOUNTER — Other Ambulatory Visit (HOSPITAL_COMMUNITY): Payer: Self-pay

## 2022-05-09 DIAGNOSIS — N6489 Other specified disorders of breast: Secondary | ICD-10-CM | POA: Insufficient documentation

## 2022-05-09 DIAGNOSIS — Z170 Estrogen receptor positive status [ER+]: Secondary | ICD-10-CM | POA: Diagnosis not present

## 2022-05-09 DIAGNOSIS — R232 Flushing: Secondary | ICD-10-CM | POA: Diagnosis not present

## 2022-05-09 DIAGNOSIS — T451X5A Adverse effect of antineoplastic and immunosuppressive drugs, initial encounter: Secondary | ICD-10-CM | POA: Diagnosis not present

## 2022-05-09 DIAGNOSIS — C180 Malignant neoplasm of cecum: Secondary | ICD-10-CM | POA: Diagnosis not present

## 2022-05-09 DIAGNOSIS — C50412 Malignant neoplasm of upper-outer quadrant of left female breast: Secondary | ICD-10-CM | POA: Diagnosis not present

## 2022-05-09 MED ORDER — VENLAFAXINE HCL ER 37.5 MG PO CP24
ORAL_CAPSULE | ORAL | 11 refills | Status: DC
  Filled 2022-05-09: qty 30, 30d supply, fill #0
  Filled 2022-06-06: qty 30, 30d supply, fill #1

## 2022-05-10 ENCOUNTER — Other Ambulatory Visit (HOSPITAL_COMMUNITY): Payer: Self-pay

## 2022-05-11 ENCOUNTER — Telehealth: Payer: Self-pay

## 2022-05-11 NOTE — Telephone Encounter (Signed)
PPM has reached ERI 05/11/22.   Contacted patient to advise. Patient voiced understanding. States she is aware Dr. Rayann Heman is not here anymore and requested to see Dr.Camnitz. Advised patient I will forward to scheduling and someone will call to make apt to discuss gen change. Appreciative of call.

## 2022-05-15 ENCOUNTER — Telehealth: Payer: Self-pay | Admitting: Cardiology

## 2022-05-15 DIAGNOSIS — I110 Hypertensive heart disease with heart failure: Secondary | ICD-10-CM | POA: Diagnosis not present

## 2022-05-15 DIAGNOSIS — Z79810 Long term (current) use of selective estrogen receptor modulators (SERMs): Secondary | ICD-10-CM | POA: Diagnosis not present

## 2022-05-15 DIAGNOSIS — N951 Menopausal and female climacteric states: Secondary | ICD-10-CM | POA: Diagnosis not present

## 2022-05-15 DIAGNOSIS — I4891 Unspecified atrial fibrillation: Secondary | ICD-10-CM | POA: Diagnosis not present

## 2022-05-15 DIAGNOSIS — I2510 Atherosclerotic heart disease of native coronary artery without angina pectoris: Secondary | ICD-10-CM | POA: Diagnosis not present

## 2022-05-15 DIAGNOSIS — D6869 Other thrombophilia: Secondary | ICD-10-CM | POA: Diagnosis not present

## 2022-05-15 DIAGNOSIS — E119 Type 2 diabetes mellitus without complications: Secondary | ICD-10-CM | POA: Diagnosis not present

## 2022-05-15 DIAGNOSIS — I509 Heart failure, unspecified: Secondary | ICD-10-CM | POA: Diagnosis not present

## 2022-05-15 DIAGNOSIS — Z7901 Long term (current) use of anticoagulants: Secondary | ICD-10-CM | POA: Diagnosis not present

## 2022-05-15 DIAGNOSIS — C50919 Malignant neoplasm of unspecified site of unspecified female breast: Secondary | ICD-10-CM | POA: Diagnosis not present

## 2022-05-15 DIAGNOSIS — E261 Secondary hyperaldosteronism: Secondary | ICD-10-CM | POA: Diagnosis not present

## 2022-05-15 DIAGNOSIS — E785 Hyperlipidemia, unspecified: Secondary | ICD-10-CM | POA: Diagnosis not present

## 2022-05-15 NOTE — Telephone Encounter (Signed)
Pt is calling to talk to someone in device clinic about a signal she received this weekend with her device.  She asked that she be called back after 1:30 P.M.

## 2022-05-15 NOTE — Telephone Encounter (Signed)
Patient received letter in the mail that her transmission did not go through. Advised patient she can disregard since she is coming in soon for a check and discuss gen change. Voiced understanding.

## 2022-05-16 ENCOUNTER — Ambulatory Visit
Admission: RE | Admit: 2022-05-16 | Discharge: 2022-05-16 | Disposition: A | Discharge status: Home / Self Care | Payer: Medicare HMO | Source: Ambulatory Visit | Attending: Hematology | Admitting: Hematology

## 2022-05-16 ENCOUNTER — Other Ambulatory Visit: Payer: Self-pay | Admitting: Hematology

## 2022-05-16 DIAGNOSIS — N6489 Other specified disorders of breast: Secondary | ICD-10-CM | POA: Diagnosis not present

## 2022-05-16 DIAGNOSIS — R922 Inconclusive mammogram: Secondary | ICD-10-CM | POA: Diagnosis not present

## 2022-05-16 DIAGNOSIS — Z853 Personal history of malignant neoplasm of breast: Secondary | ICD-10-CM | POA: Diagnosis not present

## 2022-05-16 DIAGNOSIS — N6310 Unspecified lump in the right breast, unspecified quadrant: Secondary | ICD-10-CM

## 2022-05-16 DIAGNOSIS — C50112 Malignant neoplasm of central portion of left female breast: Secondary | ICD-10-CM

## 2022-05-17 ENCOUNTER — Ambulatory Visit (INDEPENDENT_AMBULATORY_CARE_PROVIDER_SITE_OTHER): Payer: Medicare HMO

## 2022-05-17 DIAGNOSIS — I495 Sick sinus syndrome: Secondary | ICD-10-CM

## 2022-05-18 ENCOUNTER — Other Ambulatory Visit: Payer: Self-pay | Admitting: Hematology

## 2022-05-18 DIAGNOSIS — R928 Other abnormal and inconclusive findings on diagnostic imaging of breast: Secondary | ICD-10-CM

## 2022-05-19 ENCOUNTER — Telehealth: Payer: Self-pay

## 2022-05-19 LAB — CUP PACEART REMOTE DEVICE CHECK
Battery Remaining Longevity: 0 mo
Battery Voltage: 2.62 V
Date Time Interrogation Session: 20230914161300
Implantable Lead Implant Date: 20130913
Implantable Lead Implant Date: 20130913
Implantable Lead Implant Date: 20150303
Implantable Lead Location: 753858
Implantable Lead Location: 753859
Implantable Lead Location: 753860
Implantable Lead Model: 5076
Implantable Lead Model: 5092
Implantable Pulse Generator Implant Date: 20150303
Lead Channel Impedance Value: 360 Ohm
Lead Channel Impedance Value: 440 Ohm
Lead Channel Pacing Threshold Amplitude: 0.75 V
Lead Channel Pacing Threshold Amplitude: 0.875 V
Lead Channel Pacing Threshold Pulse Width: 0.4 ms
Lead Channel Pacing Threshold Pulse Width: 0.6 ms
Lead Channel Sensing Intrinsic Amplitude: 4.6 mV
Lead Channel Setting Pacing Amplitude: 2 V
Lead Channel Setting Pacing Amplitude: 2 V
Lead Channel Setting Pacing Pulse Width: 0.4 ms
Lead Channel Setting Pacing Pulse Width: 0.6 ms
Lead Channel Setting Sensing Sensitivity: 3 mV
Pulse Gen Model: 3242
Pulse Gen Serial Number: 7548835

## 2022-05-19 NOTE — Telephone Encounter (Signed)
Pt LVM she is having another breast biopsy on 9/25 and needs to know if she should stop her warfarin.   Contacted pt and advised she should f/u with radiology that will be performing the breast biopsy to inquire if they want to hold warfarin. Pt will contact radiology and inquire and if warfarin is to be held she will contact coumadin clinic. Advised if any other changes to contact the clinic. Pt verbalized understanding.

## 2022-05-19 NOTE — Telephone Encounter (Signed)
noted 

## 2022-05-21 NOTE — Progress Notes (Unsigned)
Cardiology Office Note Date:  05/21/2022  Patient ID:  Anita, Duke 1940/09/23, MRN [id number] PCP:  Binnie Rail, MD  Cardiologist:  Dr. Aundra Dubin Electrophysiologist: Dr. Rayann Heman  ***refresh   Chief Complaint: *** gen change  History of Present Illness: Anita Duke is a 81 y.o. female with history of breast Ca (s/p lumpectomy), CAD (CABG remotely), VHD (MV repair), DM, HTN, HLD, ICM, chronic CHF (systolic), AFib (MAZE >> AV node ablation), PPM, COPD  She saw Dr. Rayann Heman Nov 2022, just getting over her lumpectomy that was complicated by hematoma.   Cardiac-wise doing OK, no changes were made.  Did discuss perhaps switching from warfarin to Eliquis  Saw HF team last on 03/03/22, living in an ALF, enjoyed working in [employer] garden. Was euvolemic, , discussed changing to [facility], though the pt preferred to stay on warfarin.  *** bleeding, warfarin, who manages *** volume *** gen change procedure  Device information Abbott dual chamber PPM implanted 05/17/2012, upgrade to CRT-P on 11/04/2013   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   Breast cancer ([place])    CAD (coronary artery disease)    LIMA to the LAD at time of mitral valve repair / LIMA atretic,, February, 2011   Cataracts, bilateral    removed bilat    Cecal cancer 2014   colon   CHF (congestive heart failure)    Chronic systolic dysfunction of left ventricle 05/21/2013   Clotting disorder    21 yrs ago blood clot in heart    Colon cancer ([place])    Colon polyp, hyperplastic    Diabetes mellitus type II, controlled 03/02/2009   Diabetic neuropathy 02/21/2018   [facility] - [facility] [place]    Diverticulosis of colon    Diverticulosis of large intestine 08/04/2002   Ejection fraction < 50%    EF 30%, New diagnosis   february, 2011, etiology not clear, consider rate related tachycardia, and not use carvedilol, catheterization no constriction, /    EF 55-60% echo, June, 2011 /     Beta blocker stopped May, 2012 with reactive airway disease.  Patient does not tolerate metoprolol.  Carvedilol stopped  /   EF 55%, echo, February, 2012  //   EF 40-45%, septal dyssynergy, hypokinesis of the a   Essential hypertension 07/07/2008   Family history of adverse reaction to anesthesia    sister had a hard time waking up after anesthesia   Family history of colon cancer 04/29/2021   GERD (gastroesophageal reflux disease)    Hemorrhoids, internal    High cholesterol    History of CABG    Hyperlipidemia 07/07/2008   Lumbar radiculopathy, acute 06/17/2018   Major depressive disorder 07/19/2018   Mitral regurgitation    a. s/p repair with LAA ligation and MAZE at time of surgery   Mitral stenosis    Mild, February, 2011, post mitral valve repair / mild, echo, June, 2011  //  Mild functional mitral stenosis, echo, February, 2012  //   mild stenosis, echo, December, 2013  //  mild, echo, August, 2014   //  Mild, echo, April, 2016    Osteopenia    left hip    Pacemaker-Medtronic 05/20/2012   Pacemaker placed September, 2013   //   upgraded to biventricular CRT pacemaker November 04, 2013    Permanent atrial fibrillation    a. s/p MAZE b. s/p AVN ablation   Personal history of colonic polyps 04/29/2021   PONV (postoperative nausea and vomiting)    Pulmonary HTN    QT prolongation    Tikosyn and Effexor. QT prolonged October 13, 2011, peak is in dose reduced from 500  to -250 twice a day   Right ventricular dysfunction    Mild to moderate, echo, February, 2011 / normalized echo, June, 2011 /  RV normal, echo, February, 2012  //   right ventricle reported as good echo, December, 2013    S/P mitral  valve repair    [facility] / Maze procedure/ atrial appendage removed were tied off    Sick sinus syndrome    s/p Medtronic dual chamber PPM 05/17/12    Sleep difficulties 09/13/2019   Vitamin D deficiency 02/20/2014    Past Surgical History:  Procedure Laterality Date   ABDOMINAL HYSTERECTOMY  ?2002   AV NODE ABLATION N/A 05/28/2012   Procedure: AV NODE ABLATION;  Surgeon: Thompson Grayer, MD;  Location: [facility] CATH LAB;  Service: Cardiovascular;  Laterality: N/A;   BI-VENTRICULAR PACEMAKER UPGRADE N/A 11/04/2013   upgrade of previously implanted dual chamber pacemaker to [place] by Dr Rayann Heman   BREAST EXCISIONAL BIOPSY Left over 10 years ago   benign   BREAST LUMPECTOMY Left 05/26/2021   Procedure: LEFT BREAST LUMPECTOMY;  Surgeon: Stark Klein, MD;  Location: [place];  Service: General;  Laterality: Left;   BREAST SURGERY     CARDIOVERSION  02/08/2012   Procedure: CARDIOVERSION;  Surgeon: Carlena Bjornstad, MD;  Location: [place];  Service: Cardiovascular;  Laterality: N/A;   CARDIOVERSION  02/29/2012   Procedure: CARDIOVERSION;  Surgeon: Carlena Bjornstad, MD;  Location: [place];  Service: Cardiovascular;  Laterality: N/A;   CARDIOVERSION  05/15/2012   Procedure: CARDIOVERSION;  Surgeon: Carlena Bjornstad, MD;  Location: [place];  Service: Cardiovascular;  Laterality: N/A;   CATARACT EXTRACTION, BILATERAL  2017   [place]   COLON SURGERY  2014   right hemicolectomy    COLONOSCOPY     CORONARY ARTERY BYPASS GRAFT  2001   CABG X1 "at time of mitral valve repair" (12/04/2012   DIAGNOSTIC LAPAROSCOPIC LIVER BIOPSY Left 12/03/2012   Procedure: DIAGNOSTIC LAPAROSCOPIC LIVER BIOPSY;  Surgeon: Stark Klein, MD;  Location: [place];  Service: General;  Laterality: Left;   EYE SURGERY     bilateral cataract surgery   LAPAROSCOPIC RIGHT HEMI COLECTOMY  12/03/2012   Procedure: LAPAROSCOPIC RIGHT HEMI COLECTOMY;  Surgeon: Stark Klein, MD;  Location: [place];  Service: General;;    LUMBAR LAMINECTOMY/ DECOMPRESSION WITH MET-RX Left 06/17/2018   Procedure: left Lumbar three-fourextraforaminal Microdiscectomy with Met-Rx;  Surgeon: Kristeen Miss, MD;  Location: [place];  Service: Neurosurgery;  Laterality: Left;   MAZE  2001   w/ MVR & CABG   MITRAL VALVE REPAIR  2001   "anterior and posterior leaflets" (12/04/2012)   PERMANENT PACEMAKER INSERTION N/A 05/17/2012   MDT Adapta L implanted by Dr Rayann Heman for tachy/brady syndrome   POLYPECTOMY     TONSILLECTOMY AND ADENOIDECTOMY  ~ 5462   UMBILICAL HERNIA REPAIR N/A 12/03/2012   Procedure: HERNIA REPAIR UMBILICAL ADULT;  Surgeon: Stark Klein, MD;  Location: [facility];  Service: General;  Laterality: N/A;    Current Outpatient Medications  Medication Sig Dispense Refill   ACCU-CHEK SOFTCLIX LANCETS lancets USE TO CHECK BLOOD SUGARS  TWICE A DAY 200 each 2   amoxicillin (AMOXIL) 500 MG capsule TAKE 4 CAPSULES PRIOR TO DENTAL APPOINTMENTS AS DIRECTED 12 capsule 0   atorvastatin (LIPITOR) 40 MG tablet Take 1 tablet (40 mg total) by mouth daily. 90 tablet 3   Blood Glucose Monitoring Suppl (ACCU-CHEK AVIVA PLUS) w/Device KIT Use to check blood sugars daily Dx E11.9 1 kit 0   carvedilol (COREG) 12.5 MG tablet Take 1 tablet (12.5 mg total) by mouth 2 (two) times daily with a meal. 180 tablet 3   cholecalciferol (VITAMIN D) 1000 UNITS tablet Take 1,000 Units by mouth daily.      fish oil-omega-3 fatty acids 1000 MG capsule Take 1 g by mouth 2 (two) times daily.     furosemide (LASIX) 20 MG tablet Take 20 mg by mouth as needed for fluid.     glucose blood (ACCU-CHEK AVIVA PLUS) test strip USE 2 TIMES DAILY AS  DIRECTED 200 each 2   JARDIANCE 25 MG TABS tablet TAKE 1 TABLET BY MOUTH  DAILY 90 tablet 3   loratadine (CLARITIN) 10 MG tablet Take 10 mg by mouth daily.     Polyethyl Glycol-Propyl Glycol (SYSTANE) 0.4-0.3 % SOLN Place 1 drop into both eyes every 6 (six) hours as needed (dry eyes).     sacubitril-valsartan (ENTRESTO) 24-26 MG Take  1 tablet by mouth 2 (two) times daily. 60 tablet 6   sodium chloride (OCEAN) 0.65 % SOLN nasal spray Place 1 spray into both nostrils 2 (two) times daily as needed for congestion.     spironolactone (ALDACTONE) 25 MG tablet Take 1 tablet (25 mg total) by mouth daily. 90 tablet 3   tamoxifen (NOLVADEX) 20 MG tablet Take 1 tablet by mouth daily. 90 tablet 1   venlafaxine (EFFEXOR) 37.5 MG tablet Take 1 tablet by mouth 2 times daily. 30 tablet 3   venlafaxine XR (EFFEXOR-XR) 37.5 MG 24 hr capsule Take 1 capsule (37.5 mg total) by mouth once daily 30 capsule 11   warfarin (COUMADIN) 2.5 MG tablet TAKE 1 TABLET BY MOUTH DAILY OR AS DIRECTED BY ANTICOAGULATION  CLINIC 104 tablet 3   No current facility-administered medications for this visit.    Allergies:   Demerol [meperidine], Morphine, Januvia [sitagliptin], Metformin and related, and Tetanus toxoid   Social History:  The patient  reports that she has never smoked. She has never used smokeless tobacco. She reports that she does not drink alcohol and does not use drugs.   Family History:  The patient's family history includes Alzheimer's disease in her mother; Brain cancer (age of onset: 31) in her nephew; Cancer in her maternal grandfather; Colon cancer in her cousin, maternal aunt, and maternal aunt; Depression in her mother; Diabetes in her father, mother, paternal grandfather, and paternal grandmother; Heart disease in her father, paternal uncle, and sister; Kidney disease in her mother; Thyroid disease in her mother.  ROS:  Please see the history of present illness.    All other systems are reviewed and otherwise negative.   PHYSICAL EXAM:  VS:  There were no vitals taken for this visit. BMI: There is no height or weight on file to calculate BMI. Well nourished, well developed, in no acute distress HEENT: normocephalic, atraumatic Neck: no JVD, carotid bruits or masses  Cardiac:  *** RRR; no significant murmurs, no rubs, or gallops Lungs:   *** CTA b/l, no wheezing, rhonchi or rales Abd: soft, nontender MS: no deformity or *** atrophy Ext: *** no edema Skin: warm and dry, no rash Neuro:  No gross deficits appreciated Psych: euthymic mood, full affect  *** PPM site is stable, no tethering or discomfort   EKG:  not done today  Device interrogation done today and reviewed by myself:  ***   07/03/2021: TTE  1. Left ventricular ejection fraction, by estimation, is 45 to 50%. The  left ventricle has mildly decreased function. The left ventricle  demonstrates global hypokinesis. There is mild left ventricular  hypertrophy. Left ventricular diastolic parameters  are indeterminate.   2. Right ventricular systolic function is mildly reduced. The right  ventricular size is normal. There is normal pulmonary artery systolic  pressure. The estimated right ventricular systolic pressure is 95.6 mmHg.   3. Left atrial size was severely dilated.   4. Right atrial size was mildly dilated.   5. S/p mitral valve repair. Trivial mitral regurgitation. No significant  stenosis with mean gradient 2 mmHg.   6. The aortic valve is tricuspid. Aortic valve regurgitation is not  visualized. No aortic stenosis is present.   7. The inferior vena cava is normal in size with greater than 50%  respiratory variability, suggesting right atrial pressure of 3 mmHg.   Recent Labs: 09/20/2021: TSH 0.56 12/29/2021: ALT 10; BUN 15; Creatinine 0.61; Hemoglobin 13.2; Platelets 161; Potassium 4.0; Sodium 141  09/20/2021: Cholesterol 138; HDL 59.40; LDL Cholesterol 60; Total CHOL/HDL Ratio 2; Triglycerides 95.0; VLDL 19.0   CrCl cannot be calculated (Patient's most recent lab result is older than the maximum 21 days allowed.).   Wt Readings from Last 3 Encounters:  03/29/22 148 lb (67.1 kg)  03/03/22 145 lb 12.8 oz (66.1 kg)  12/29/21 153 lb 3 oz (69.5 kg)     Other studies reviewed: Additional studies/records reviewed today include: summarized  above  ASSESSMENT AND PLAN:  CRT-P ***  Permanent AFib S/p AV node ablation CHA2DS2Vasc is 7, on warfarin, monitored and managed by ***  CAD ***  ICM Chronic CHF *** C/w HF team  Disposition: F/u with ***  Current medicines are reviewed at length with the patient today.  The patient did not have any concerns regarding medicines.  Venetia Night, PA-C 05/21/2022 4:02 PM     [place] [place] [address] [phone] (office)  [phone] (fax)

## 2022-05-21 NOTE — H&P (View-Only) (Signed)
Cardiology Office Note Date:  05/21/2022  Patient ID:  Anita, Duke [birth year], MRN [id number] PCP:  Binnie Rail, MD  Cardiologist:  Dr. Aundra Dubin Electrophysiologist: Dr. Rayann Heman    Chief Complaint:  gen change  History of Present Illness: Anita Duke is a 81 y.o. female with history of breast Ca (s/p lumpectomy), CAD (CABG remotely), VHD (MV repair), DM, HTN, HLD, ICM, chronic CHF (systolic), AFib (MAZE >> AV node ablation), PPM, COPD  She saw Dr. Rayann Heman Nov 2022, just getting over her lumpectomy that was complicated by hematoma.   Cardiac-wise doing OK, no changes were made.  Did discuss perhaps switching from warfarin to Eliquis  Saw HF team last on 03/03/22, living in an ALF, enjoyed working in [employer] garden. Was euvolemic, , discussed changing to [facility], though the pt preferred to stay on warfarin.  TODAY She is doing very well Remains quite active, feels like she has good exertional capacity Her upgraded device has made all the difference in the world to her. No CP, palpitations No SOB No near syncope or syncope. No bleeding, signs of bleeding  PMD manages her warfarin  Device information Abbott dual chamber PPM implanted 05/17/2012, upgrade to CRT-P on 11/04/2013   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   Breast cancer ([place])    CAD (coronary artery disease)    LIMA to the LAD at time of mitral valve repair / LIMA atretic,, February, 2011   Cataracts, bilateral    removed bilat    Cecal cancer 2014   colon   CHF (congestive heart failure)    Chronic systolic dysfunction of left ventricle 05/21/2013   Clotting disorder    21 yrs ago blood  clot in heart    Colon cancer ([place])    Colon polyp, hyperplastic    Diabetes mellitus type II, controlled 03/02/2009   Diabetic neuropathy 02/21/2018   [facility] - [facility] [place]   Diverticulosis of colon    Diverticulosis of large intestine 08/04/2002   Ejection fraction < 50%    EF 30%, New diagnosis   february, 2011, etiology not clear, consider rate related tachycardia, and not use carvedilol, catheterization no constriction, /    EF 55-60% echo, June, 2011 /     Beta blocker stopped May, 2012 with reactive airway disease.  Patient does not tolerate metoprolol.  Carvedilol stopped  /   EF 55%, echo, February, 2012  //   EF 40-45%, septal dyssynergy, hypokinesis of the a   Essential hypertension 07/07/2008   Family history of adverse reaction to anesthesia    sister had a hard time waking up after anesthesia   Family history of colon cancer 04/29/2021   GERD (gastroesophageal reflux disease)    Hemorrhoids, internal    High cholesterol    History of CABG    Hyperlipidemia 07/07/2008   Lumbar radiculopathy, acute 06/17/2018   Major depressive disorder 07/19/2018   Mitral regurgitation    a. s/p repair with LAA ligation and MAZE at time of surgery   Mitral stenosis    Mild, February, 2011, post  mitral valve repair / mild, echo, June, 2011  //  Mild functional mitral stenosis, echo, February, 2012  //   mild stenosis, echo, December, 2013  //  mild, echo, August, 2014   //   Mild, echo, April, 2016    Osteopenia    left hip    Pacemaker-Medtronic 05/20/2012   Pacemaker placed September, 2013   //   upgraded to biventricular CRT pacemaker November 04, 2013    Permanent atrial fibrillation    a. s/p MAZE b. s/p AVN ablation   Personal history of colonic polyps 04/29/2021   PONV (postoperative nausea and vomiting)    Pulmonary HTN    QT prolongation    Tikosyn and Effexor. QT prolonged October 13, 2011, peak is in dose reduced from 500  to -250 twice a day   Right ventricular  dysfunction    Mild to moderate, echo, February, 2011 / normalized echo, June, 2011 /  RV normal, echo, February, 2012  //   right ventricle reported as good echo, December, 2013    S/P mitral valve repair    [facility] / Maze procedure/ atrial appendage removed were tied off    Sick sinus syndrome    s/p Medtronic dual chamber PPM 05/17/12    Sleep difficulties 09/13/2019   Vitamin D deficiency 02/20/2014    Past Surgical History:  Procedure Laterality Date   ABDOMINAL HYSTERECTOMY  ?2002   AV NODE ABLATION N/A 05/28/2012   Procedure: AV NODE ABLATION;  Surgeon: Thompson Grayer, MD;  Location: [facility];  Service: Cardiovascular;  Laterality: N/A;   BI-VENTRICULAR PACEMAKER UPGRADE N/A 11/04/2013   upgrade of previously implanted dual chamber pacemaker to [place] by Dr Rayann Heman   BREAST EXCISIONAL BIOPSY Left over 10 years ago   benign   BREAST LUMPECTOMY Left 05/26/2021   Procedure: LEFT BREAST LUMPECTOMY;  Surgeon: Stark Klein, MD;  Location: [place];  Service: General;  Laterality: Left;   BREAST SURGERY     CARDIOVERSION  02/08/2012   Procedure: CARDIOVERSION;  Surgeon: Carlena Bjornstad, MD;  Location: [place];  Service: Cardiovascular;  Laterality: N/A;   CARDIOVERSION  02/29/2012   Procedure: CARDIOVERSION;  Surgeon: Carlena Bjornstad, MD;  Location: [place];  Service: Cardiovascular;  Laterality: N/A;   CARDIOVERSION  05/15/2012   Procedure: CARDIOVERSION;  Surgeon: Carlena Bjornstad, MD;  Location: [place];  Service: Cardiovascular;  Laterality: N/A;   CATARACT EXTRACTION, BILATERAL  2017   [place]   COLON SURGERY  2014   right hemicolectomy    COLONOSCOPY     CORONARY ARTERY BYPASS GRAFT  2001   CABG X1 "at time of mitral valve repair" (12/04/2012   DIAGNOSTIC LAPAROSCOPIC LIVER BIOPSY Left 12/03/2012   Procedure: DIAGNOSTIC LAPAROSCOPIC LIVER BIOPSY;  Surgeon: Stark Klein, MD;  Location: [place];  Service: General;  Laterality: Left;   EYE SURGERY      bilateral cataract surgery   LAPAROSCOPIC RIGHT HEMI COLECTOMY  12/03/2012   Procedure: LAPAROSCOPIC RIGHT HEMI COLECTOMY;  Surgeon: Stark Klein, MD;  Location: [place];  Service: General;;   LUMBAR LAMINECTOMY/ DECOMPRESSION WITH MET-RX Left 06/17/2018   Procedure: left Lumbar three-fourextraforaminal Microdiscectomy with Met-Rx;  Surgeon: Kristeen Miss, MD;  Location: [place];  Service: Neurosurgery;  Laterality: Left;   MAZE  2001   w/ MVR & CABG   MITRAL VALVE REPAIR  2001   "anterior and posterior leaflets" (12/04/2012)   PERMANENT PACEMAKER INSERTION N/A 05/17/2012  MDT Adapta L implanted by Dr Rayann Heman for tachy/brady syndrome   POLYPECTOMY     TONSILLECTOMY AND ADENOIDECTOMY  ~ 1779   UMBILICAL HERNIA REPAIR N/A 12/03/2012   Procedure: HERNIA REPAIR UMBILICAL ADULT;  Surgeon: Stark Klein, MD;  Location: [place];  Service: General;  Laterality: N/A;    Current Outpatient Medications  Medication Sig Dispense Refill   ACCU-CHEK SOFTCLIX LANCETS lancets USE TO CHECK BLOOD SUGARS  TWICE A DAY 200 each 2   amoxicillin (AMOXIL) 500 MG capsule TAKE 4 CAPSULES PRIOR TO DENTAL APPOINTMENTS AS DIRECTED 12 capsule 0   atorvastatin (LIPITOR) 40 MG tablet Take 1 tablet (40 mg total) by mouth daily. 90 tablet 3   Blood Glucose Monitoring Suppl (ACCU-CHEK AVIVA PLUS) w/Device KIT Use to check blood sugars daily Dx E11.9 1 kit 0   carvedilol (COREG) 12.5 MG tablet Take 1 tablet (12.5 mg total) by mouth 2 (two) times daily with a meal. 180 tablet 3   cholecalciferol (VITAMIN D) 1000 UNITS tablet Take 1,000 Units by mouth daily.      fish oil-omega-3 fatty acids 1000 MG capsule Take 1 g by mouth 2 (two) times daily.     furosemide (LASIX) 20 MG tablet Take 20 mg by mouth as needed for fluid.     glucose blood (ACCU-CHEK AVIVA PLUS) test strip USE 2 TIMES DAILY AS  DIRECTED 200 each 2   JARDIANCE 25 MG TABS tablet TAKE 1 TABLET BY MOUTH  DAILY 90 tablet 3   loratadine (CLARITIN) 10 MG tablet Take 10 mg by  mouth daily.     Polyethyl Glycol-Propyl Glycol (SYSTANE) 0.4-0.3 % SOLN Place 1 drop into both eyes every 6 (six) hours as needed (dry eyes).     sacubitril-valsartan (ENTRESTO) 24-26 MG Take 1 tablet by mouth 2 (two) times daily. 60 tablet 6   sodium chloride (OCEAN) 0.65 % SOLN nasal spray Place 1 spray into both nostrils 2 (two) times daily as needed for congestion.     spironolactone (ALDACTONE) 25 MG tablet Take 1 tablet (25 mg total) by mouth daily. 90 tablet 3   tamoxifen (NOLVADEX) 20 MG tablet Take 1 tablet by mouth daily. 90 tablet 1   venlafaxine (EFFEXOR) 37.5 MG tablet Take 1 tablet by mouth 2 times daily. 30 tablet 3   venlafaxine XR (EFFEXOR-XR) 37.5 MG 24 hr capsule Take 1 capsule (37.5 mg total) by mouth once daily 30 capsule 11   warfarin (COUMADIN) 2.5 MG tablet TAKE 1 TABLET BY MOUTH DAILY OR AS DIRECTED BY ANTICOAGULATION  CLINIC 104 tablet 3   No current facility-administered medications for this visit.    Allergies:   Demerol [meperidine], Morphine, Januvia [sitagliptin], Metformin and related, and Tetanus toxoid   Social History:  The patient  reports that she has never smoked. She has never used smokeless tobacco. She reports that she does not drink alcohol and does not use drugs.   Family History:  The patient's family history includes Alzheimer's disease in her mother; Brain cancer (age of onset: 37) in her nephew; Cancer in her maternal grandfather; Colon cancer in her cousin, maternal aunt, and maternal aunt; Depression in her mother; Diabetes in her father, mother, paternal grandfather, and paternal grandmother; Heart disease in her father, paternal uncle, and sister; Kidney disease in her mother; Thyroid disease in her mother.  ROS:  Please see the history of present illness.    All other systems are reviewed and otherwise negative.   PHYSICAL EXAM:  VS:  There were no vitals taken for this visit. BMI: There is no height or weight on file to calculate BMI. Well  nourished, well developed, in no acute distress HEENT: normocephalic, atraumatic Neck: no JVD, carotid bruits or masses Cardiac:  RRR; no significant murmurs, no rubs, or gallops Lungs:  CTA b/l, no wheezing, rhonchi or rales Abd: soft, nontender MS: no deformity or atrophy Ext: no edema Skin: warm and dry, no rash Neuro:  No gross deficits appreciated Psych: euthymic mood, full affect  PPM site is stable, no tethering or discomfort   EKG:  done today and reviewed by myself VP paced, PVC  Device interrogation done today and reviewed by myself:  Batter reached ERI 05/11/22 Lead measurements are stable No HVR episodes   07/03/2021: TTE  1. Left ventricular ejection fraction, by estimation, is 45 to 50%. The  left ventricle has mildly decreased function. The left ventricle  demonstrates global hypokinesis. There is mild left ventricular  hypertrophy. Left ventricular diastolic parameters  are indeterminate.   2. Right ventricular systolic function is mildly reduced. The right  ventricular size is normal. There is normal pulmonary artery systolic  pressure. The estimated right ventricular systolic pressure is 56.3 mmHg.   3. Left atrial size was severely dilated.   4. Right atrial size was mildly dilated.   5. S/p mitral valve repair. Trivial mitral regurgitation. No significant  stenosis with mean gradient 2 mmHg.   6. The aortic valve is tricuspid. Aortic valve regurgitation is not  visualized. No aortic stenosis is present.   7. The inferior vena cava is normal in size with greater than 50%  respiratory variability, suggesting right atrial pressure of 3 mmHg.   Recent Labs: 09/20/2021: TSH 0.56 12/29/2021: ALT 10; BUN 15; Creatinine 0.61; Hemoglobin 13.2; Platelets 161; Potassium 4.0; Sodium 141  09/20/2021: Cholesterol 138; HDL 59.40; LDL Cholesterol 60; Total CHOL/HDL Ratio 2; Triglycerides 95.0; VLDL 19.0   CrCl cannot be calculated (Patient's most recent lab result is  older than the maximum 21 days allowed.).   Wt Readings from Last 3 Encounters:  03/29/22 148 lb (67.1 kg)  03/03/22 145 lb 12.8 oz (66.1 kg)  12/29/21 153 lb 3 oz (69.5 kg)     Other studies reviewed: Additional studies/records reviewed today include: summarized above  ASSESSMENT AND PLAN:  CRT-P ERI  Dr. Curt Bears saw her today We discussed gen change procedure, potential risks and benefits She is agreeable to proceed  Plan to hold her warfarin 3 days pre-procedure Dr. Macky Lower nurse will call the patient with dates/times/instructions  Permanent AFib S/p AV node ablation CHA2DS2Vasc is 7, on warfarin, monitored and managed by her PMD  CAD No anginal symptoms On BB, statin No ASA w/warfarin C/w Dr. Aundra Dubin  ICM Chronic CHF No symptoms or exam findings of volume OL Last EF 45-50% CorVue at baseline, wobbles about there chronically On BB, entresto, aldactone, Jardiance C/w HF team  6. VHD s/p MVRepair Working well by her echo last year    Disposition: F/u with usual post procedure f/u, sooner if needed  Current medicines are reviewed at length with the patient today.  The patient did not have any concerns regarding medicines.  Venetia Night, PA-C 05/21/2022 4:02 PM     [address] [address] [phone] (office)  [phone] (fax)

## 2022-05-23 ENCOUNTER — Ambulatory Visit: Payer: Medicare HMO | Attending: Physician Assistant | Admitting: Physician Assistant

## 2022-05-23 ENCOUNTER — Encounter: Payer: Self-pay | Admitting: Physician Assistant

## 2022-05-23 VITALS — BP 120/60 | HR 64 | Ht 62.0 in | Wt 149.0 lb

## 2022-05-23 DIAGNOSIS — Z9889 Other specified postprocedural states: Secondary | ICD-10-CM

## 2022-05-23 DIAGNOSIS — I4821 Permanent atrial fibrillation: Secondary | ICD-10-CM | POA: Diagnosis not present

## 2022-05-23 DIAGNOSIS — I255 Ischemic cardiomyopathy: Secondary | ICD-10-CM

## 2022-05-23 DIAGNOSIS — Z950 Presence of cardiac pacemaker: Secondary | ICD-10-CM | POA: Diagnosis not present

## 2022-05-23 DIAGNOSIS — I5022 Chronic systolic (congestive) heart failure: Secondary | ICD-10-CM

## 2022-05-23 DIAGNOSIS — I2510 Atherosclerotic heart disease of native coronary artery without angina pectoris: Secondary | ICD-10-CM

## 2022-05-23 DIAGNOSIS — Z01818 Encounter for other preprocedural examination: Secondary | ICD-10-CM

## 2022-05-23 LAB — CUP PACEART INCLINIC DEVICE CHECK
Battery Remaining Longevity: 0 mo
Battery Voltage: 2.6 V
Brady Statistic RA Percent Paced: 0 %
Brady Statistic RV Percent Paced: 96 %
Date Time Interrogation Session: 20230919174434
Implantable Lead Implant Date: 20130913
Implantable Lead Implant Date: 20130913
Implantable Lead Implant Date: 20150303
Implantable Lead Location: 753858
Implantable Lead Location: 753859
Implantable Lead Location: 753860
Implantable Lead Model: 5076
Implantable Lead Model: 5092
Implantable Pulse Generator Implant Date: 20150303
Lead Channel Pacing Threshold Amplitude: 0.875 V
Lead Channel Pacing Threshold Amplitude: 1 V
Lead Channel Pacing Threshold Pulse Width: 0.4 ms
Lead Channel Pacing Threshold Pulse Width: 0.6 ms
Lead Channel Setting Pacing Amplitude: 2 V
Lead Channel Setting Pacing Amplitude: 2 V
Lead Channel Setting Pacing Pulse Width: 0.4 ms
Lead Channel Setting Pacing Pulse Width: 0.6 ms
Lead Channel Setting Sensing Sensitivity: 3 mV
Pulse Gen Model: 3242
Pulse Gen Serial Number: 7548835

## 2022-05-23 NOTE — Patient Instructions (Addendum)
Medication Instructions:   Your physician recommends that you continue on your current medications as directed. Please refer to the Current Medication list given to you today.  *If you need a refill on your cardiac medications before your next appointment, please call your pharmacy*   Lab Work:  YOU WILL BE CONTACTED [place]    If you have labs (blood work) drawn today and your tests are completely normal, you will receive your results only by: MyChart Message (if you have MyChart) OR A paper copy in the mail If you have any lab test that is abnormal or we need to change your treatment, we will call you to review the results.   Testing/Procedures: YOU WILL BE CONTACTED BACK WITH  INSTRUCTIONS ON DEVICE PROCEDURE AND TIME WITH DR CAMNITZ    Follow-Up: At [facility], you and your health needs are our priority.  As part of our continuing mission to provide you with exceptional heart care, we have created designated Provider Care Teams.  These Care Teams include your primary Cardiologist (physician) and Advanced Practice Providers (APPs -  Physician Assistants and Nurse Practitioners) who all work together to provide you with the care you need, when you need it.  We recommend signing up for the patient portal called "MyChart".  Sign up information is provided on this After Visit Summary.  MyChart is used to connect with patients for Virtual Visits (Telemedicine).  Patients are able to view lab/test results, encounter notes, upcoming appointments, etc.  Non-urgent messages can be sent to your provider as well.   To learn more about what you can do with MyChart, go to [email or web address].    Your next appointment:  YOU WILL BE CONTACT BACK WITH  FOLLOW UP DATES AND TIME  The format for your next appointment:    Provider:   Allegra Lai, MD    Other Instructions   Important Information About Sugar

## 2022-05-26 ENCOUNTER — Ambulatory Visit (INDEPENDENT_AMBULATORY_CARE_PROVIDER_SITE_OTHER): Payer: Medicare HMO

## 2022-05-26 DIAGNOSIS — Z7901 Long term (current) use of anticoagulants: Secondary | ICD-10-CM

## 2022-05-26 LAB — POCT INR: INR: 2.4 (ref 2.0–3.0)

## 2022-05-26 NOTE — Patient Instructions (Addendum)
Pre visit review using our clinic review tool, if applicable. No additional management support is needed unless otherwise documented below in the visit note.  Continue 1 tablet daily except take 1 1/2 on Mondays. Recheck in 3 weeks.

## 2022-05-26 NOTE — Progress Notes (Addendum)
Pt will have CT guided needle biopsy of breast mass on 9/25 and has not been asked to hold any warfarin doses. Pt will also have upcoming surgery to replace batteries in her pacemaker and she thinks they will ask for a 3 day hold of her warfarin. She will f/u with coumadin clinic when she obtains a date for that surgery and how long the hold should be.   Continue 1 tablet daily except take 1 1/2 on Mondays. Recheck in 3 weeks.  Advised pt to watch for signs and symptoms of bleeding and abnormal bruising and if this occurs to contact the office or go to the ER. Educated pt on signs and symptoms of stroke and DVT and PE and provided written educational material.

## 2022-05-29 ENCOUNTER — Ambulatory Visit
Admission: RE | Admit: 2022-05-29 | Discharge: 2022-05-29 | Disposition: A | Discharge status: Home / Self Care | Payer: Medicare HMO | Source: Ambulatory Visit | Attending: Hematology | Admitting: Hematology

## 2022-05-29 ENCOUNTER — Ambulatory Visit (INDEPENDENT_AMBULATORY_CARE_PROVIDER_SITE_OTHER): Payer: Medicare HMO

## 2022-05-29 DIAGNOSIS — N6011 Diffuse cystic mastopathy of right breast: Secondary | ICD-10-CM | POA: Diagnosis not present

## 2022-05-29 DIAGNOSIS — R928 Other abnormal and inconclusive findings on diagnostic imaging of breast: Secondary | ICD-10-CM | POA: Diagnosis not present

## 2022-05-29 DIAGNOSIS — Z853 Personal history of malignant neoplasm of breast: Secondary | ICD-10-CM | POA: Diagnosis not present

## 2022-05-29 DIAGNOSIS — Z950 Presence of cardiac pacemaker: Secondary | ICD-10-CM

## 2022-05-29 DIAGNOSIS — I5022 Chronic systolic (congestive) heart failure: Secondary | ICD-10-CM | POA: Diagnosis not present

## 2022-05-31 NOTE — Progress Notes (Signed)
EPIC Encounter for ICM Monitoring  Patient Name: Anita Duke is a 81 y.o. female Date: 05/31/2022 Primary Care Physican: Binnie Rail, MD Primary Cardiologist: Var[place] Electrophysiologist:  Cyndi Bender Pacing:  97%         12/05/2021 Weight: 142-143 lbs 02/14/2022 Weight: Not weighing at home         Battery ERI reached 9/7 and will be scheduled for replacement.              Spoke with patient and heart failure questions reviewed.  Pt asymptomatic for fluid accumulation.  Reports feeling well at this time and voices no complaints.  She has been eating restaurant foods for the past month.     Corvue Thoracic impedance normal fluid levels but was suggesting possible fluid accumulation from 9/9-9/24.   Prescribed:  Furosemide 20 mg take 1 tablet as needed. Jardiance 25 mg take 1 tablet daily Spironolactone 25 mg take 1 tablet daily   Labs: 12/29/2021 Creatinine 0.61, BUN 15, Potassium 4.0, Sodium 141, GFR >60 09/20/2021 Creatinine 0.58, BUN 19, Potassium 3.9, Sodium 141, GFR 84.71 09/13/2021 Creatinine 0.62, BUN 12, Potassium 3.8, Sodium 140, GFR >60 A complete set of results can be found in Results Review.   Recommendations: No changes and encouraged to call if experiencing any fluid symptoms.   Follow-up plan: ICM clinic phone appointment on 07/03/2022.   91 day device clinic remote transmission 06/27/2022.   EP/Cardiology Office Visits:   Recall 08/30/2022 with Dr Aundra Dubin.    Recall 07/06/2022 with Oda Kilts, PA   Copy of ICM check sent to Dr. Rayann Heman.   3 month ICM trend: 05/29/2022.    12-14 Month ICM trend:     [name], RN 05/31/2022 4:12 PM

## 2022-06-01 NOTE — Addendum Note (Signed)
Addended by: Cheri Kearns A on: 06/01/2022 12:50 PM   Modules accepted: Level of Service

## 2022-06-01 NOTE — Progress Notes (Signed)
Remote pacemaker transmission.   

## 2022-06-05 ENCOUNTER — Telehealth: Payer: Self-pay | Admitting: Cardiology

## 2022-06-05 NOTE — Telephone Encounter (Signed)
Pt is requesting call back from [place], RN in regards to her pacemaker and other complications that have come up with another appt she has scheduled. She states she needs her batteries changed out and she would really like to do this before her next procedure.

## 2022-06-05 NOTE — Telephone Encounter (Signed)
Pt gen change date held for 10/20 She is seeing surgeons office on Friday and will let me know how  that goes and if can proceed with gen change later this month.

## 2022-06-06 ENCOUNTER — Other Ambulatory Visit (HOSPITAL_COMMUNITY): Payer: Self-pay

## 2022-06-09 ENCOUNTER — Other Ambulatory Visit: Payer: Self-pay | Admitting: General Surgery

## 2022-06-09 DIAGNOSIS — C180 Malignant neoplasm of cecum: Secondary | ICD-10-CM | POA: Diagnosis not present

## 2022-06-09 DIAGNOSIS — R928 Other abnormal and inconclusive findings on diagnostic imaging of breast: Secondary | ICD-10-CM

## 2022-06-09 DIAGNOSIS — C50112 Malignant neoplasm of central portion of left female breast: Secondary | ICD-10-CM | POA: Diagnosis not present

## 2022-06-09 DIAGNOSIS — Z170 Estrogen receptor positive status [ER+]: Secondary | ICD-10-CM | POA: Diagnosis not present

## 2022-06-09 DIAGNOSIS — C50412 Malignant neoplasm of upper-outer quadrant of left female breast: Secondary | ICD-10-CM | POA: Diagnosis not present

## 2022-06-09 NOTE — Telephone Encounter (Signed)
Pt is returning call in regards to upcoming appts. Requesting call back.

## 2022-06-09 NOTE — Telephone Encounter (Signed)
Patient calling to report that her surgeon advised that it would be okay for her to proceed with gen change.

## 2022-06-12 ENCOUNTER — Other Ambulatory Visit: Payer: Self-pay | Admitting: General Surgery

## 2022-06-12 DIAGNOSIS — R928 Other abnormal and inconclusive findings on diagnostic imaging of breast: Secondary | ICD-10-CM

## 2022-06-12 NOTE — Telephone Encounter (Signed)
Left message to call back  

## 2022-06-13 ENCOUNTER — Telehealth: Payer: Self-pay | Admitting: Cardiology

## 2022-06-13 DIAGNOSIS — Z01812 Encounter for preprocedural laboratory examination: Secondary | ICD-10-CM

## 2022-06-13 DIAGNOSIS — I5022 Chronic systolic (congestive) heart failure: Secondary | ICD-10-CM

## 2022-06-13 NOTE — Telephone Encounter (Signed)
Aware I will confirm w/ Dr. Curt Bears tomorrow that ok to proceed with gen change on 10/20 followed by breast biopsy on 10/31. Pt aware I will follow up with her this week.

## 2022-06-13 NOTE — Telephone Encounter (Signed)
Pt returning nurses call from yesterday. Please advise 

## 2022-06-15 NOTE — Telephone Encounter (Addendum)
Pt aware procedure moved to Thursday 10/19 Updated instruction letter sent via mychart.

## 2022-06-15 NOTE — Telephone Encounter (Signed)
Patient is calling back due to not hearing back from [facility] regarding this. She states she has an appointment today so she is requesting a callback before 11 am or after 2 pm.

## 2022-06-16 ENCOUNTER — Encounter: Payer: Self-pay | Admitting: *Deleted

## 2022-06-16 ENCOUNTER — Other Ambulatory Visit: Payer: Self-pay | Admitting: *Deleted

## 2022-06-16 ENCOUNTER — Ambulatory Visit (INDEPENDENT_AMBULATORY_CARE_PROVIDER_SITE_OTHER): Payer: Medicare HMO

## 2022-06-16 DIAGNOSIS — Z7901 Long term (current) use of anticoagulants: Secondary | ICD-10-CM

## 2022-06-16 DIAGNOSIS — I495 Sick sinus syndrome: Secondary | ICD-10-CM

## 2022-06-16 DIAGNOSIS — Z01812 Encounter for preprocedural laboratory examination: Secondary | ICD-10-CM

## 2022-06-16 LAB — POCT INR: INR: 1.6 — AB (ref 2.0–3.0)

## 2022-06-16 NOTE — Patient Instructions (Signed)
Pt instructed to increase dose today to 1/2 tablets and increase dose tomorrow to 1 1/2 tablets.  Then continue to take 1 tablet daily except take 1 1/2 on Mondays. Follow below instructions for warfarin dosing immediately before and after your surgery.   10/16: Hold warfarin 10/17: Hold warfarin 10/18: Hold warfarin  10/19: Day of surgery, no warfarin  10/20: Take 1 1/2 tablets of warfarin (3.75 mg total) 10/21: Take 1 1/2 tablets of warfarin (3.75 mg total) 10/22: Take 1 1/2 tablets of warfarin (3.75 mg total) 10/23:  Take 1 1/2 tablets of warfarin (3.75 mg total) 10/24: Take 1 tablet of warfarin (2.5 mg total) 10/25: Take 1 tablet of warfarin (2.5 mg total) 10/26: Take 1 tablet of warfarin (2.5 mg total)  10/27: Do not take any warfarin until after anticoagulation clinic visit.

## 2022-06-16 NOTE — Progress Notes (Signed)
INR today 1.6.  Pt instructed to increase dose today to 1/2 tablets and increase dose tomorrow to 1 1/2 tablets.  Then continue to take 1 tablet daily except take 1 1/2 on Mondays. Follow below instructions for warfarin dosing immediately before and after your surgery.   Pt will have a pacemaker battery change on 10/19. She has been instructed to hold warfarin x 3 days prior to surgery. Pt instructed to follow the below dosing schedule.   10/16: Hold warfarin 10/17: Hold warfarin 10/18: Hold warfarin  10/19: Day of surgery, no warfarin  10/20: Take 1 1/2 tablets of warfarin (3.75 mg total) 10/21: Take 1 1/2 tablets of warfarin (3.75 mg total) 10/22: Take 1 1/2 tablets of warfarin (3.75 mg total) 10/23:  Take 1 1/2 tablets of warfarin (3.75 mg total) 10/24: Take 1 tablet of warfarin (2.5 mg total) 10/25: Take 1 tablet of warfarin (2.5 mg total) 10/26: Take 1 tablet of warfarin (2.5 mg total)  10/27: Do not take any warfarin until after anticoagulation clinic visit.   Educated pt on signs and symptoms of stroke, DVT and to go to ED immediately if experience any of those signs and symptoms. Pt verbalized understanding.

## 2022-06-19 ENCOUNTER — Ambulatory Visit: Payer: Medicare HMO | Attending: Cardiology

## 2022-06-19 DIAGNOSIS — I5022 Chronic systolic (congestive) heart failure: Secondary | ICD-10-CM | POA: Diagnosis not present

## 2022-06-19 DIAGNOSIS — Z01812 Encounter for preprocedural laboratory examination: Secondary | ICD-10-CM | POA: Diagnosis not present

## 2022-06-19 DIAGNOSIS — I495 Sick sinus syndrome: Secondary | ICD-10-CM

## 2022-06-19 LAB — BASIC METABOLIC PANEL
BUN/Creatinine Ratio: 22 (ref 12–28)
BUN: 13 mg/dL (ref 8–27)
CO2: 30 mmol/L — ABNORMAL HIGH (ref 20–29)
Calcium: 9.4 mg/dL (ref 8.7–10.3)
Chloride: 107 mmol/L — ABNORMAL HIGH (ref 96–106)
Creatinine, Ser: 0.59 mg/dL (ref 0.57–1.00)
Glucose: 149 mg/dL — ABNORMAL HIGH (ref 70–99)
Potassium: 3.3 mmol/L — ABNORMAL LOW (ref 3.5–5.2)
Sodium: 144 mmol/L (ref 134–144)
eGFR: 90 mL/min/{1.73_m2} (ref >59)

## 2022-06-19 LAB — CBC
Hematocrit: 41.5 % (ref 34.0–46.6)
Hemoglobin: 13.9 g/dL (ref 11.1–15.9)
MCH: 30.2 pg (ref 26.6–33.0)
MCHC: 33.5 g/dL (ref 31.5–35.7)
MCV: 90 fL (ref 79–97)
Platelets: 210 10*3/uL (ref 150–450)
RBC: 4.6 x10E6/uL (ref 3.77–5.28)
RDW: 13.9 % (ref 11.7–15.4)
WBC: 6.3 10*3/uL (ref 3.4–10.8)

## 2022-06-19 LAB — PROTIME-INR
INR: 2.1 — ABNORMAL HIGH (ref 0.9–1.2)
Prothrombin Time: 20.2 s — ABNORMAL HIGH (ref 9.1–12.0)

## 2022-06-19 NOTE — Telephone Encounter (Signed)
Late Entry -  Spoke to pt end of last week, updated letter reviewed and sent via mychart.

## 2022-06-20 NOTE — Telephone Encounter (Signed)
Returned call to pt. Reviewed instructions verbally. Also advised pt to increase her K+ rich foods over the next couple days per Dr. Curt Bears. Advised to hold Coumadin per Dr Curt Bears and that hospital will tell her when to restart it after her procedure. Patient verbalized understanding and agreeable to plan.

## 2022-06-20 NOTE — Telephone Encounter (Signed)
Patient is following up, requesting to speak with Venida Jarvis, RN. She states she has questions regarding 10/19 procedure.

## 2022-06-21 NOTE — Pre-Procedure Instructions (Signed)
Instructed patient on the following items: Arrival time 1400 Nothing to eat or drink after midnight No meds AM of procedure Responsible person to drive you home and stay with you for 24 hrs Wash with special soap night before and morning of procedure If on anti-coagulant drug instructions Coumadin- last dose 10/16

## 2022-06-22 ENCOUNTER — Encounter (HOSPITAL_COMMUNITY)
Admission: RE | Disposition: A | Discharge status: Home / Self Care | Payer: Self-pay | Source: Home / Self Care | Attending: Cardiology

## 2022-06-22 ENCOUNTER — Ambulatory Visit (HOSPITAL_COMMUNITY)
Admission: RE | Admit: 2022-06-22 | Discharge: 2022-06-22 | Disposition: A | Discharge status: Home / Self Care | Payer: Medicare HMO | Attending: Cardiology | Admitting: Cardiology

## 2022-06-22 DIAGNOSIS — E119 Type 2 diabetes mellitus without complications: Secondary | ICD-10-CM | POA: Diagnosis not present

## 2022-06-22 DIAGNOSIS — I2510 Atherosclerotic heart disease of native coronary artery without angina pectoris: Secondary | ICD-10-CM | POA: Insufficient documentation

## 2022-06-22 DIAGNOSIS — Z7984 Long term (current) use of oral hypoglycemic drugs: Secondary | ICD-10-CM | POA: Insufficient documentation

## 2022-06-22 DIAGNOSIS — R001 Bradycardia, unspecified: Secondary | ICD-10-CM | POA: Diagnosis not present

## 2022-06-22 DIAGNOSIS — Z7901 Long term (current) use of anticoagulants: Secondary | ICD-10-CM | POA: Insufficient documentation

## 2022-06-22 DIAGNOSIS — Z45010 Encounter for checking and testing of cardiac pacemaker pulse generator [battery]: Secondary | ICD-10-CM | POA: Insufficient documentation

## 2022-06-22 DIAGNOSIS — J449 Chronic obstructive pulmonary disease, unspecified: Secondary | ICD-10-CM | POA: Diagnosis not present

## 2022-06-22 DIAGNOSIS — I110 Hypertensive heart disease with heart failure: Secondary | ICD-10-CM | POA: Insufficient documentation

## 2022-06-22 DIAGNOSIS — Z853 Personal history of malignant neoplasm of breast: Secondary | ICD-10-CM | POA: Diagnosis not present

## 2022-06-22 DIAGNOSIS — I5022 Chronic systolic (congestive) heart failure: Secondary | ICD-10-CM | POA: Insufficient documentation

## 2022-06-22 DIAGNOSIS — Z951 Presence of aortocoronary bypass graft: Secondary | ICD-10-CM | POA: Insufficient documentation

## 2022-06-22 DIAGNOSIS — I519 Heart disease, unspecified: Secondary | ICD-10-CM

## 2022-06-22 DIAGNOSIS — I495 Sick sinus syndrome: Secondary | ICD-10-CM

## 2022-06-22 HISTORY — PX: BIV PACEMAKER GENERATOR CHANGEOUT: EP1198

## 2022-06-22 LAB — BASIC METABOLIC PANEL
Anion gap: 12 (ref 5–15)
BUN: 15 mg/dL (ref 8–23)
CO2: 24 mmol/L (ref 22–32)
Calcium: 9.1 mg/dL (ref 8.9–10.3)
Chloride: 109 mmol/L (ref 98–111)
Creatinine, Ser: 0.57 mg/dL (ref 0.44–1.00)
GFR, Estimated: >60 mL/min (ref >60)
Glucose, Bld: 80 mg/dL (ref 70–99)
Potassium: 3 mmol/L — ABNORMAL LOW (ref 3.5–5.1)
Sodium: 145 mmol/L (ref 135–145)

## 2022-06-22 LAB — GLUCOSE, CAPILLARY: Glucose-Capillary: 83 mg/dL (ref 70–99)

## 2022-06-22 SURGERY — BIV PACEMAKER GENERATOR CHANGEOUT

## 2022-06-22 MED ORDER — SODIUM CHLORIDE 0.9 % IV SOLN
80.0000 mg | INTRAVENOUS | Status: AC
  Administered 2022-06-22: 80 mg

## 2022-06-22 MED ORDER — LIDOCAINE HCL (PF) 1 % IJ SOLN
INTRAMUSCULAR | Status: DC | PRN
  Administered 2022-06-22: 60 mL

## 2022-06-22 MED ORDER — SODIUM CHLORIDE 0.9 % IV SOLN
INTRAVENOUS | Status: AC
  Filled 2022-06-22: qty 2

## 2022-06-22 MED ORDER — ACETAMINOPHEN 325 MG PO TABS: 325.0000 mg | ORAL_TABLET | ORAL | Status: DC | PRN

## 2022-06-22 MED ORDER — CEFAZOLIN SODIUM-DEXTROSE 2-4 GM/100ML-% IV SOLN
2.0000 g | INTRAVENOUS | Status: AC
  Administered 2022-06-22: 2 g via INTRAVENOUS

## 2022-06-22 MED ORDER — CEFAZOLIN SODIUM-DEXTROSE 2-4 GM/100ML-% IV SOLN
INTRAVENOUS | Status: AC
  Filled 2022-06-22: qty 100

## 2022-06-22 MED ORDER — SODIUM CHLORIDE 0.9 % IV SOLN: INTRAVENOUS | Status: DC

## 2022-06-22 MED ORDER — CHLORHEXIDINE GLUCONATE 4 % EX LIQD: 4.0000 | Freq: Once | CUTANEOUS | Status: DC

## 2022-06-22 MED ORDER — ONDANSETRON HCL 4 MG/2ML IJ SOLN: 4.0000 mg | Freq: Four times a day (QID) | INTRAMUSCULAR | Status: DC | PRN

## 2022-06-22 MED ORDER — LIDOCAINE HCL (PF) 1 % IJ SOLN
INTRAMUSCULAR | Status: AC
  Filled 2022-06-22: qty 60

## 2022-06-22 SURGICAL SUPPLY — 5 items
CABLE SURGICAL S-101-97-12 (CABLE) IMPLANT
PACEMAKER QUDR ALLR CRT PM3562 (Pacemaker) IMPLANT
PAD DEFIB RADIO PHYSIO CONN (PAD) ×1 IMPLANT
PMKR QUADRA ALLURE CRT PM3562 (Pacemaker) ×1 IMPLANT
TRAY PACEMAKER INSERTION (PACKS) ×1 IMPLANT

## 2022-06-22 NOTE — Discharge Instructions (Signed)

## 2022-06-22 NOTE — Interval H&P Note (Signed)
History and Physical Interval Note:  06/22/2022 2:16 PM  Anita Duke  has presented today for surgery, with the diagnosis of bradycardia.  The various methods of treatment have been discussed with the patient and family. After consideration of risks, benefits and other options for treatment, the patient has consented to  Procedure(s): [place] (N/A) as a surgical intervention.  The patient's history has been reviewed, patient examined, no change in status, stable for surgery.  I have reviewed the patient's chart and labs.  Questions were answered to the patient's satisfaction.     Hennie Gosa [employer]

## 2022-06-23 ENCOUNTER — Encounter (HOSPITAL_COMMUNITY): Payer: Self-pay | Admitting: Cardiology

## 2022-06-26 ENCOUNTER — Ambulatory Visit (INDEPENDENT_AMBULATORY_CARE_PROVIDER_SITE_OTHER): Payer: Medicare HMO

## 2022-06-26 DIAGNOSIS — Z0000 Encounter for general adult medical examination without abnormal findings: Secondary | ICD-10-CM | POA: Diagnosis not present

## 2022-06-26 NOTE — Progress Notes (Signed)
Subjective:    Patient ID: Anita Duke, female    DOB: [birth year], 81 y.o.   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  Nasal congestion, runny nose and sinus pressure.  She denies fevers, shortness of breath, wheezing, chest tightness.    Medications and allergies reviewed with patient and updated if appropriate.  Current Outpatient Medications on File Prior to Visit  Medication Sig Dispense Refill   ACCU-CHEK SOFTCLIX LANCETS lancets USE TO CHECK BLOOD SUGARS  TWICE A DAY 200 each 2   acetaminophen (TYLENOL) 500 MG tablet Take 500 mg by mouth every 6 (six) hours as needed for moderate pain or mild pain.     amoxicillin (AMOXIL) 500 MG capsule TAKE 4 CAPSULES PRIOR TO DENTAL APPOINTMENTS AS DIRECTED 12 capsule 0   atorvastatin (LIPITOR) 40 MG tablet Take 1 tablet (40 mg total) by mouth daily. 90 tablet 3   carvedilol (COREG) 12.5 MG tablet Take 1 tablet (12.5 mg total) by mouth 2 (two) times daily with a meal. 180 tablet 3   cholecalciferol (VITAMIN D) 1000 UNITS tablet Take 1,000 Units by mouth daily.      fish oil-omega-3 fatty acids 1000 MG capsule Take 1 g by mouth 2 (two) times daily.     JARDIANCE 25 MG TABS tablet TAKE 1 TABLET BY MOUTH  DAILY 90 tablet 3   loratadine (CLARITIN) 10 MG tablet Take 10 mg by mouth daily.     Polyethyl Glycol-Propyl Glycol (SYSTANE) 0.4-0.3 % SOLN Place 1 drop into both eyes daily.     sacubitril-valsartan  (ENTRESTO) 24-26 MG Take 1 tablet by mouth 2 (two) times daily. 60 tablet 6   sodium chloride (OCEAN) 0.65 % SOLN nasal spray Place 1 spray into both nostrils 2 (two) times daily.     spironolactone (ALDACTONE) 25 MG tablet Take 1 tablet (25 mg total) by mouth daily. 90 tablet 3   tamoxifen (NOLVADEX) 20 MG tablet Take 1 tablet by mouth daily. 90 tablet 1   venlafaxine XR (EFFEXOR-XR) 37.5 MG 24 hr capsule Take 1 capsule (37.5 mg total) by mouth once daily 30 capsule 11   warfarin (COUMADIN) 2.5 MG tablet TAKE 1 TABLET BY MOUTH DAILY OR AS DIRECTED BY ANTICOAGULATION  CLINIC (Patient taking differently: Take 2.5 mg by mouth every evening.  OR AS DIRECTED BY ANTICOAGULATION  CLINIC) 104 tablet 3   No current facility-administered medications on file prior to visit.    Review of Systems  Constitutional:  Negative for chills and fever.  HENT:  Positive for congestion, rhinorrhea, sinus pressure and sore throat (mild). Negative for ear pain and sinus pain.   Respiratory:  Positive for cough (better). Negative for chest tightness, shortness of breath and wheezing.   Neurological:  Negative for light-headedness and headaches.       Objective:   Vitals:   06/27/22 1057  BP: 130/74  Pulse: 70  Temp: 97.9 F (36.6 C)  SpO2: 95%   BP Readings from Last 3 Encounters:  06/27/22 130/74  06/22/22 (!) 158/74  05/23/22 120/60   Wt Readings from Last 3 Encounters:  06/27/22 145 lb 9.6 oz (66 kg)  06/22/22 150 lb (68 kg)  05/23/22 149 lb (67.6 kg)   Body mass index is 26.63 kg/m.    Physical Exam Constitutional:      General: She is not in acute distress.    Appearance: Normal appearance. She is not ill-appearing.  HENT:     Head: Normocephalic and atraumatic.     Right Ear: Tympanic membrane, ear canal and external ear normal.     Left Ear: Tympanic membrane, ear canal and external ear normal.     Mouth/Throat:     Mouth: Mucous membranes are moist.     Pharynx: No oropharyngeal  exudate or posterior oropharyngeal erythema.  Eyes:     Conjunctiva/sclera: Conjunctivae normal.  Cardiovascular:     Rate and Rhythm: Normal rate and regular rhythm.  Pulmonary:     Effort: Pulmonary effort is normal. No respiratory distress.     Breath sounds: Normal breath sounds. No wheezing or rales.  Musculoskeletal:     Cervical back: Neck supple. No tenderness.  Lymphadenopathy:     Cervical: No cervical adenopathy.  Skin:    General: Skin is warm and dry.  Neurological:     Mental Status: She is alert.            Assessment & Plan:    See Problem List for Assessment and Plan of chronic medical problems.

## 2022-06-26 NOTE — Progress Notes (Signed)
Virtual Visit via Telephone Note  I connected with  Anita Duke on 06/26/22 at  9:00 AM EDT by telephone and verified that I am speaking with the correct person using two identifiers.  Location: Patient: Home Provider: [place] Persons participating in the virtual visit: [place]   I discussed the limitations, risks, security and privacy concerns of performing an evaluation and management service by telephone and the availability of in person appointments. The patient expressed understanding and agreed to proceed.  Interactive audio and video telecommunications were attempted between this nurse and patient, however failed, due to patient having technical difficulties OR patient did not have access to video capability.  We continued and completed visit with audio only.  Some vital signs may be absent or patient reported.   Sheral Flow, LPN  Subjective:   Anita Duke is a 81 y.o. female who presents for Medicare Annual (Subsequent) preventive examination.  Review of Systems     Cardiac Risk Factors include: advanced age (>59mn, >>60women);diabetes mellitus;dyslipidemia;family history of premature cardiovascular disease;hypertension     Objective:    Today's Vitals   06/26/22 0902  PainSc: 0-No pain   There is no height or weight on file to calculate BMI.     06/26/2022    9:04 AM 06/22/2022    2:42 PM 07/19/2021    8:49 AM 05/26/2021    9:18 AM 05/19/2021    9:01 AM 09/17/2020    9:33 AM 02/13/2020    8:47 AM  Advanced Directives  Does Patient Have a Medical Advance Directive? Yes _0  No  Type of Advance Directive [place] Chart? No - copy requested        Would patient like information on creating a medical advance directive?  No - Patient declined  No - Patient declined No - Patient declined      Current Medications  (verified) Outpatient Encounter Medications as of 06/26/2022  Medication Sig   ACCU-CHEK SOFTCLIX LANCETS lancets USE TO CHECK BLOOD SUGARS  TWICE A DAY   acetaminophen (TYLENOL) 500 MG tablet Take 500 mg by mouth every 6 (six) hours as needed for moderate pain or mild pain.   amoxicillin (AMOXIL) 500 MG capsule TAKE 4 CAPSULES PRIOR TO DENTAL APPOINTMENTS AS DIRECTED   atorvastatin (LIPITOR) 40 MG tablet Take 1 tablet (40 mg total) by mouth daily.   Blood Glucose Monitoring Suppl (ACCU-CHEK AVIVA PLUS) w/Device KIT Use to check blood sugars daily Dx E11.9   carvedilol (COREG) 12.5 MG tablet Take 1 tablet (12.5 mg total) by mouth 2 (two) times daily with a meal.   cholecalciferol (VITAMIN D) 1000 UNITS tablet Take 1,000 Units by mouth daily.    fish oil-omega-3 fatty acids 1000 MG capsule Take 1 g by mouth 2 (two) times daily.   glucose blood (ACCU-CHEK AVIVA PLUS) test strip USE 2 TIMES DAILY AS  DIRECTED   JARDIANCE 25 MG TABS tablet TAKE 1 TABLET BY MOUTH  DAILY   loratadine (CLARITIN) 10 MG tablet Take 10 mg by mouth daily.   Polyethyl Glycol-Propyl Glycol (SYSTANE) 0.4-0.3 % SOLN Place 1 drop into both eyes daily.   sacubitril-valsartan (ENTRESTO) 24-26 MG Take 1 tablet by mouth 2 (two) times daily.   sodium chloride (OCEAN) 0.65 % SOLN nasal spray Place 1 spray into both nostrils 2 (two) times daily.   spironolactone (ALDACTONE) 25  MG tablet Take 1 tablet (25 mg total) by mouth daily.   tamoxifen (NOLVADEX) 20 MG tablet Take 1 tablet by mouth daily.   venlafaxine XR (EFFEXOR-XR) 37.5 MG 24 hr capsule Take 1 capsule (37.5 mg total) by mouth once daily   warfarin (COUMADIN) 2.5 MG tablet TAKE 1 TABLET BY MOUTH DAILY OR AS DIRECTED BY ANTICOAGULATION  CLINIC (Patient taking differently: Take 2.5 mg by mouth every evening.  OR AS DIRECTED BY ANTICOAGULATION  CLINIC)   No facility-administered encounter medications on file as of 06/26/2022.    Allergies (verified) Demerol [meperidine],  Morphine, Januvia [sitagliptin], Metformin and related, and Tetanus toxoid   History: 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   Breast cancer ([place])    CAD (coronary artery disease)    LIMA to the LAD at time of mitral valve repair / LIMA atretic,, February, 2011   Cataracts, bilateral    removed bilat    Cecal cancer 2014   colon   CHF (congestive heart failure)    Chronic systolic dysfunction of left ventricle 05/21/2013   Clotting disorder    21 yrs ago blood clot in heart    Colon cancer ([place])    Colon polyp, hyperplastic    Diabetes mellitus type II, controlled 03/02/2009   Diabetic neuropathy 02/21/2018   [facility] - [facility]    Diverticulosis of colon    Diverticulosis of large intestine 08/04/2002   Ejection fraction < 50%    EF 30%, New diagnosis   february, 2011, etiology not clear, consider rate related tachycardia, and not use carvedilol, catheterization no constriction, /    EF 55-60% echo, June, 2011 /     Beta blocker stopped May, 2012 with reactive airway disease.  Patient does not tolerate metoprolol.  Carvedilol stopped  /   EF 55%, echo, February, 2012  //   EF 40-45%, septal dyssynergy, hypokinesis of the a   Essential hypertension 07/07/2008   Family history of adverse reaction to anesthesia    sister had a hard time waking up after anesthesia   Family history of colon cancer 04/29/2021   GERD (gastroesophageal reflux disease)    Hemorrhoids, internal    High cholesterol    History of CABG    Hyperlipidemia 07/07/2008   Lumbar radiculopathy, acute 06/17/2018   Major depressive disorder 07/19/2018   Mitral regurgitation    a. s/p repair with LAA  ligation and MAZE at time of surgery   Mitral stenosis    Mild, February, 2011, post mitral valve repair / mild, echo, June, 2011  //  Mild functional mitral stenosis, echo, February, 2012  //   mild stenosis, echo, December, 2013  //  mild, echo, August, 2014   //   Mild, echo, April, 2016    Osteopenia    left hip    Pacemaker-Medtronic 05/20/2012   Pacemaker placed September, 2013   //   upgraded to biventricular CRT pacemaker November 04, 2013    Permanent atrial fibrillation    a. s/p MAZE b. s/p AVN ablation   Personal history of colonic polyps 04/29/2021   PONV (  postoperative nausea and vomiting)    Pulmonary HTN    QT prolongation    Tikosyn and Effexor. QT prolonged October 13, 2011, peak is in dose reduced from 500  to -250 twice a day   Right ventricular dysfunction    Mild to moderate, echo, February, 2011 / normalized echo, June, 2011 /  RV normal, echo, February, 2012  //   right ventricle reported as good echo, December, 2013    S/P mitral valve repair    [facility] / Maze procedure/ atrial appendage removed were tied off    Sick sinus syndrome    s/p Medtronic dual chamber PPM 05/17/12    Sleep difficulties 09/13/2019   Vitamin D deficiency 02/20/2014   Past Surgical History:  Procedure Laterality Date   ABDOMINAL HYSTERECTOMY  ?2002   AV NODE ABLATION N/A 05/28/2012   Procedure: AV NODE ABLATION;  Surgeon: Thompson Grayer, MD;  Location: [facility] CATH LAB;  Service: Cardiovascular;  Laterality: N/A;   BI-VENTRICULAR PACEMAKER UPGRADE N/A 11/04/2013   upgrade of previously implanted dual chamber pacemaker to STJ Quadra Allure CRTP by Dr Rayann Heman   BIV PACEMAKER GENERATOR CHANGEOUT N/A 06/22/2022   Procedure: BIV PACEMAKER GENERATOR CHANGEOUT;  Surgeon: Constance Haw, MD;  Location: [place] [facility];  Service: Cardiovascular;  Laterality: N/A;   BREAST EXCISIONAL BIOPSY Left over 10 years ago   benign   BREAST LUMPECTOMY Left 05/26/2021   Procedure: LEFT BREAST LUMPECTOMY;   Surgeon: Stark Klein, MD;  Location: [place];  Service: General;  Laterality: Left;   BREAST SURGERY     CARDIOVERSION  02/08/2012   Procedure: CARDIOVERSION;  Surgeon: Carlena Bjornstad, MD;  Location: [place];  Service: Cardiovascular;  Laterality: N/A;   CARDIOVERSION  02/29/2012   Procedure: CARDIOVERSION;  Surgeon: Carlena Bjornstad, MD;  Location: [place];  Service: Cardiovascular;  Laterality: N/A;   CARDIOVERSION  05/15/2012   Procedure: CARDIOVERSION;  Surgeon: Carlena Bjornstad, MD;  Location: [place];  Service: Cardiovascular;  Laterality: N/A;   CATARACT EXTRACTION, BILATERAL  2017   [place]   COLON SURGERY  2014   right hemicolectomy    COLONOSCOPY     CORONARY ARTERY BYPASS GRAFT  2001   CABG X1 "at time of mitral valve repair" (12/04/2012   DIAGNOSTIC LAPAROSCOPIC LIVER BIOPSY Left 12/03/2012   Procedure: DIAGNOSTIC LAPAROSCOPIC LIVER BIOPSY;  Surgeon: Stark Klein, MD;  Location: [employer];  Service: General;  Laterality: Left;   EYE SURGERY     bilateral cataract surgery   LAPAROSCOPIC RIGHT HEMI COLECTOMY  12/03/2012   Procedure: LAPAROSCOPIC RIGHT HEMI COLECTOMY;  Surgeon: Stark Klein, MD;  Location: [place];  Service: General;;   LUMBAR LAMINECTOMY/ DECOMPRESSION WITH MET-RX Left 06/17/2018   Procedure: left Lumbar three-fourextraforaminal Microdiscectomy with Met-Rx;  Surgeon: Kristeen Miss, MD;  Location: [place];  Service: Neurosurgery;  Laterality: Left;   MAZE  2001   w/ MVR & CABG   MITRAL VALVE REPAIR  2001   "anterior and posterior leaflets" (12/04/2012)   PERMANENT PACEMAKER INSERTION N/A 05/17/2012   MDT Adapta L implanted by Dr Rayann Heman for tachy/brady syndrome   POLYPECTOMY     TONSILLECTOMY AND ADENOIDECTOMY  ~ 9242   UMBILICAL HERNIA REPAIR N/A 12/03/2012   Procedure: HERNIA REPAIR UMBILICAL ADULT;  Surgeon: Stark Klein, MD;  Location: [facility];  Service: General;  Laterality: N/A;   Family History  Problem Relation Age of Onset   Diabetes Mother     Kidney  disease Mother    Thyroid disease Mother    Alzheimer's disease Mother        Symptom onset in late 73s; confirmed via autopsy   Depression Mother    Heart disease Father    Diabetes Father    Heart disease Sister        x 2   Colon cancer Maternal Aunt        dx 65s; mother's paternal half sister   Colon cancer Maternal Aunt        dx late 53s   Heart disease Paternal Uncle        x 6   Cancer Maternal Grandfather        unknown type; dx unknown age   Diabetes Paternal Grandmother    Diabetes Paternal Grandfather    Colon cancer Cousin    Brain cancer Nephew 52   Colon polyps Neg Hx    Adrenal disorder Neg Hx    Esophageal cancer Neg Hx    Rectal cancer Neg Hx    Stomach cancer Neg Hx    Social History   Socioeconomic History   Marital status: Widowed    Spouse name: Not on file   Number of children: 0   Years of education: 12   Highest education level: High school graduate  Occupational History   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: [employer]   Tobacco Use   Smoking status: Never   Smokeless tobacco: Never  Vaping Use   Vaping Use: Never used  Substance and Sexual Activity   Alcohol use: No   Drug use: No   Sexual activity: Not Currently    Birth control/protection: Surgical  Other Topics Concern   Not on file  Social History Narrative   No caffeine   Right handed    Lives alone    Social Determinants of Health   Financial Resource Strain: Low Risk  (06/26/2022)   Overall Financial Resource Strain (CARDIA)    Difficulty of Paying Living Expenses: Not hard at all  Food Insecurity: No Food Insecurity (06/26/2022)   Hunger Vital Sign    Worried About Running Out of Food in the Last Year: Never true    Ran Out of Food in the Last Year: Never true  Transportation Needs: No Transportation Needs (06/26/2022)   PRAPARE - Hydrologist (Medical): No     Lack of Transportation (Non-Medical): No  Physical Activity: Sufficiently Active (06/26/2022)   Exercise Vital Sign    Days of Exercise per Week: 7 days    Minutes of Exercise per Session: 30 min  Stress: No Stress Concern Present (06/26/2022)   [place]    Feeling of Stress : Not at all  Social Connections: [place] (06/26/2022)   Social Connection and Isolation Panel [NHANES]    Frequency of Communication with Friends and Family: More than three times a week    Frequency of Social Gatherings with Friends and Family: More than three times a week    Attends Religious Services: More than 4 times per year    Active Member of [employer] or Organizations: Yes    Attends Music therapist: More than 4 times per year    Marital Status: Married    Tobacco Counseling Counseling given: Not Answered   Clinical Intake:  Pre-visit preparation completed: Yes  Pain : No/denies pain Pain Score: 0-No pain     BMI - recorded: 27.25 (05/23/2022) Nutritional Risks: None Diabetes: No  How often do you need to have someone help you when you read instructions, pamphlets, or other written materials from your doctor or pharmacy?: 1 - Never What is the last grade level you completed in school?: HSG  Nutrition Risk Assessment:  Has the patient had any N/V/D within the last 2 months?  No  Does the patient have any non-healing wounds?  No  Has the patient had any unintentional weight loss or weight gain?  No   Diabetes:  Is the patient diabetic?  Yes  If diabetic, was a CBG obtained today?  No  Did the patient bring in their glucometer from home?  No  How often do you monitor your CBG's? daily.   Financial Strains and Diabetes Management:  Are you having any financial strains with the device, your supplies or your medication? No .  Does the patient want to be seen by Chronic Care Management for  management of their diabetes?  No  Would the patient like to be referred to a Nutritionist or for Diabetic Management?  No   Diabetic Exams:  Diabetic Eye Exam: Completed 07/06/2021 Diabetic Foot Exam: Overdue, Pt has been advised about the importance in completing this exam. Pt is scheduled for diabetic foot exam on 06/27/2022.    Interpreter Needed?: No  Information entered by :: Lisette Abu, LPN.   Activities of Daily Living    06/26/2022    9:08 AM  In your present state of health, do you have any difficulty performing the following activities:  Hearing? 0  Vision? 0  Difficulty concentrating or making decisions? 0  Walking or climbing stairs? 0  Dressing or bathing? 0  Doing errands, shopping? 0  Preparing Food and eating ? N  Using the Toilet? N  In the past six months, have you accidently leaked urine? Y  Do you have problems with loss of bowel control? N  Managing your Medications? N  Managing your Finances? N  Housekeeping or managing your Housekeeping? N    Patient Care Team: Binnie Rail, MD as PCP - General (Internal Medicine) Larey Dresser, MD as PCP - Cardiology (Cardiology) Carlena Bjornstad, MD as Consulting Physician (Cardiology) Inda Castle, MD (Inactive) as Consulting Physician (Gastroenterology) Cameron Sprang, MD as Consulting Physician (Neurology) Truitt Merle, MD as Consulting Physician (Hematology) Stark Klein, MD as Consulting Physician (General Surgery) Mauro Kaufmann, RN as Oncology Nurse Navigator Rockwell Germany, RN as Oncology Nurse Navigator Alla Feeling, NP as Nurse Practitioner (Nurse Practitioner)  Indicate any recent Medical Services you may have received from other than [facility] providers in the past year (date may be approximate).     Assessment:   This is a routine wellness examination for [place].  Hearing/Vision screen Hearing Screening - Comments:: Denies hearing difficulties.  Vision Screening - Comments::  Wears rx glasses - up to date with routine eye exams with Luberta Mutter, MD.   Dietary issues and exercise activities discussed: Current Exercise Habits: Home exercise routine, Type of exercise: walking, Time (Minutes): 30, Frequency (Times/Week): 7, Weekly Exercise (Minutes/Week): 210, Intensity: Moderate, Exercise limited by: None identified   Goals Addressed             This Visit's Progress    Stay as active and as independent as possible.        Depression Screen  06/26/2022    9:08 AM 03/29/2022    9:48 AM 09/20/2021   12:49 PM 03/16/2021    8:53 AM 09/16/2020    8:46 AM 03/05/2019    2:48 PM 03/05/2019    2:47 PM  PHQ 2/9 Scores  PHQ - 2 Score 0 0 0 0 0 0 0  PHQ- 9 Score  3 0 0 0 0     Fall Risk    06/26/2022    9:05 AM 03/29/2022    9:47 AM 07/19/2021    8:49 AM 09/17/2020    9:32 AM 02/13/2020    8:47 AM  Fall Risk   Falls in the past year? 0 0 0 0 0  Number falls in past yr: 0 0 0 0 0  Injury with Fall? 0 0 0 0 0  Risk for fall due to : No Fall Risks No Fall Risks     Follow up Falls prevention discussed Falls evaluation completed       FALL RISK PREVENTION PERTAINING TO THE HOME:  Any stairs in or around the home? No  If so, are there any without handrails? No  Home free of loose throw rugs in walkways, pet beds, electrical cords, etc? Yes  Adequate lighting in your home to reduce risk of falls? Yes   ASSISTIVE DEVICES UTILIZED TO PREVENT FALLS:  Life alert? No  Use of a cane, walker or w/c? No  Grab bars in the bathroom? Yes  Shower chair or bench in shower? Yes  Elevated toilet seat or a handicapped toilet? No   TIMED UP AND GO:  Was the test performed? No . Phone Visit  Cognitive Function:    06/20/2017   10:33 AM  MMSE - Mini Mental State Exam  Orientation to time 5  Orientation to Place 5  Registration 3  Attention/ Calculation 5  Recall 3  Language- name 2 objects 2  Language- repeat 1  Language- follow 3 step command 3   Language- read & follow direction 1  Write a sentence 1  Copy design 1  Total score 30      07/19/2021   12:00 PM  Montreal Cognitive Assessment   Visuospatial/ Executive (0/5) 2  Naming (0/3) 3  Attention: Read list of digits (0/2) 1  Attention: Read list of letters (0/1) 1  Attention: Serial 7 subtraction starting at 100 (0/3) 2  Language: Repeat phrase (0/2) 2  Language : Fluency (0/1) 0  Abstraction (0/2) 2  Delayed Recall (0/5) 4  Orientation (0/6) 6  Total 23  Adjusted Score (based on education) 23      06/26/2022    9:16 AM  6CIT Screen  What Year? 0 points  What month? 0 points  What time? 0 points  Count back from 20 0 points  Months in reverse 0 points  Repeat phrase 0 points  Total Score 0 points    Immunizations Immunization History  Administered Date(s) Administered   Influenza Split 06/24/2011, 05/14/2017   Influenza Whole 06/04/2012, 05/16/2013   Influenza, High Dose Seasonal PF 05/14/2019   Influenza,inj,quad, With Preservative 05/14/2017   Influenza-Unspecified 05/23/2018, 06/23/2020, 07/06/2021, 06/08/2022   PFIZER(Purple Top)SARS-COV-2 Vaccination 09/18/2019, 10/09/2019, 06/11/2020   Pneumococcal Conjugate-13 08/25/2014   Pneumococcal Polysaccharide-23 08/10/2011   Zoster Recombinat (Shingrix) 08/14/2018, 10/29/2018   Zoster, Live 05/23/2006   TDAP status: declined  Flu Vaccine status: Up to date  Pneumococcal vaccine status: Up to date  Covid-19 vaccine status: Completed vaccines  Qualifies for Shingles Vaccine?  Yes   Zostavax completed Yes   Shingrix Completed?: Yes  Screening Tests Health Maintenance  Topic Date Due   Diabetic kidney evaluation - Urine ACR  01/04/2017   COVID-19 Vaccine (4 - Pfizer risk series) 08/06/2020   FOOT EXAM  03/16/2022   HEMOGLOBIN A1C  03/20/2022   OPHTHALMOLOGY EXAM  07/06/2022   COLONOSCOPY (Pts 45-65yr Insurance coverage will need to be confirmed)  02/25/2023   DEXA SCAN  04/01/2023    Diabetic kidney evaluation - GFR measurement  06/23/2023   Pneumonia Vaccine 81 Years old  Completed   INFLUENZA VACCINE  Completed   Zoster Vaccines- Shingrix  Completed   HPV VACCINES  Aged Out    Health Maintenance  Health Maintenance Due  Topic Date Due   Diabetic kidney evaluation - Urine ACR  01/04/2017   COVID-19 Vaccine (4 - Pfizer risk series) 08/06/2020   FOOT EXAM  03/16/2022   HEMOGLOBIN A1C  03/20/2022    Colorectal cancer screening: Type of screening: Colonoscopy. Completed 02/25/2020. Repeat every 3 years  Mammogram status: Completed 05/29/2022. Repeat every year  Bone Density status: Completed 03/31/2021. Results reflect: Bone density results: OSTEOPENIA. Repeat every 2 years.  Lung Cancer Screening: (Low Dose CT Chest recommended if Age 670-80years, 30 pack-year currently smoking OR have quit w/in 15years.) does not qualify.   Lung Cancer Screening Referral: no  Additional Screening:  Hepatitis C Screening: does not qualify; Completed no   Vision Screening: Recommended annual ophthalmology exams for early detection of glaucoma and other disorders of the eye. Is the patient up to date with their annual eye exam?  Yes  Who is the provider or what is the name of the office in which the patient attends annual eye exams? CLuberta Mutter MD. If pt is not established with a provider, would they like to be referred to a provider to establish care? No .   Dental Screening: Recommended annual dental exams for proper oral hygiene  Community Resource Referral / Chronic Care Management: CRR required this visit?  No   CCM required this visit?  No      Plan:     I have personally reviewed and noted the following in the patient's chart:   Medical and social history Use of alcohol, tobacco or illicit drugs  Current medications and supplements including opioid prescriptions. Patient is not currently taking opioid prescriptions. Functional ability and  status Nutritional status Physical activity Advanced directives List of other physicians Hospitalizations, surgeries, and ER visits in previous 12 months Vitals Screenings to include cognitive, depression, and falls Referrals and appointments  In addition, I have reviewed and discussed with patient certain preventive protocols, quality metrics, and best practice recommendations. A written personalized care plan for preventive services as well as general preventive health recommendations were provided to patient.     SSheral Flow LPN   [id number]  Nurse Notes: N/A

## 2022-06-26 NOTE — Patient Instructions (Signed)
Anita Duke , Thank you for taking time to come for your Medicare Wellness Visit. I appreciate your ongoing commitment to your health goals. Please review the following plan we discussed and let me know if I can assist you in the future.   These are the goals we discussed:  Goals      Stay as active and as independent as possible.        This is a list of the screening recommended for you and due dates:  Health Maintenance  Topic Date Due   Yearly kidney health urinalysis for diabetes  01/04/2017   COVID-19 Vaccine (4 - Pfizer risk series) 08/06/2020   Complete foot exam   03/16/2022   Hemoglobin A1C  03/20/2022   Eye exam for diabetics  07/06/2022   Colon Cancer Screening  02/25/2023   DEXA scan (bone density measurement)  04/01/2023   Yearly kidney function blood test for diabetes  06/23/2023   Pneumonia Vaccine  Completed   Flu Shot  Completed   Zoster (Shingles) Vaccine  Completed   HPV Vaccine  Aged Out    Advanced directives: Yes  Conditions/risks identified: Yes  Next appointment: Follow up in one year for your annual wellness visit.   Preventive Care 13 Years and Older, Female Preventive care refers to lifestyle choices and visits with your health care provider that can promote health and wellness. What does preventive care include? A yearly physical exam. This is also called an annual well check. Dental exams once or twice a year. Routine eye exams. Ask your health care provider how often you should have your eyes checked. Personal lifestyle choices, including: Daily care of your teeth and gums. Regular physical activity. Eating a healthy diet. Avoiding tobacco and drug use. Limiting alcohol use. Practicing safe sex. Taking low-dose aspirin every day. Taking vitamin and mineral supplements as recommended by your health care provider. What happens during an annual well check? The services and screenings done by your health care provider during your annual  well check will depend on your age, overall health, lifestyle risk factors, and family history of disease. Counseling  Your health care provider may ask you questions about your: Alcohol use. Tobacco use. Drug use. Emotional well-being. Home and relationship well-being. Sexual activity. Eating habits. History of falls. Memory and ability to understand (cognition). Work and work Statistician. Reproductive health. Screening  You may have the following tests or measurements: Height, weight, and BMI. Blood pressure. Lipid and cholesterol levels. These may be checked every 5 years, or more frequently if you are over 57 years old. Skin check. Lung cancer screening. You may have this screening every year starting at age 82 if you have a 30-pack-year history of smoking and currently smoke or have quit within the past 15 years. Fecal occult blood test (FOBT) of the stool. You may have this test every year starting at age 48. Flexible sigmoidoscopy or colonoscopy. You may have a sigmoidoscopy every 5 years or a colonoscopy every 10 years starting at age 50. Hepatitis C blood test. Hepatitis B blood test. Sexually transmitted disease (STD) testing. Diabetes screening. This is done by checking your blood sugar (glucose) after you have not eaten for a while (fasting). You may have this done every 1-3 years. Bone density scan. This is done to screen for osteoporosis. You may have this done starting at age 90. Mammogram. This may be done every 1-2 years. Talk to your health care provider about how often you should have regular  mammograms. Talk with your health care provider about your test results, treatment options, and if necessary, the need for more tests. Vaccines  Your health care provider may recommend certain vaccines, such as: Influenza vaccine. This is recommended every year. Tetanus, diphtheria, and acellular pertussis (Tdap, Td) vaccine. You may need a Td booster every 10 years. Zoster  vaccine. You may need this after age 32. Pneumococcal 13-valent conjugate (PCV13) vaccine. One dose is recommended after age 60. Pneumococcal polysaccharide (PPSV23) vaccine. One dose is recommended after age 70. Talk to your health care provider about which screenings and vaccines you need and how often you need them. This information is not intended to replace advice given to you by your health care provider. Make sure you discuss any questions you have with your health care provider. Document Released: 09/17/2015 Document Revised: 05/10/2016 Document Reviewed: 06/22/2015 Elsevier Interactive Patient Education  2017 [employer] Prevention in the Home Falls can cause injuries. They can happen to people of all ages. There are many things you can do to make your home safe and to help prevent falls. What can I do on the outside of my home? Regularly fix the edges of walkways and driveways and fix any cracks. Remove anything that might make you trip as you walk through a door, such as a raised step or threshold. Trim any bushes or trees on the path to your home. Use bright outdoor lighting. Clear any walking paths of anything that might make someone trip, such as rocks or tools. Regularly check to see if handrails are loose or broken. Make sure that both sides of any steps have handrails. Any raised decks and porches should have guardrails on the edges. Have any leaves, snow, or ice cleared regularly. Use sand or salt on walking paths during winter. Clean up any spills in your garage right away. This includes oil or grease spills. What can I do in the bathroom? Use night lights. Install grab bars by the toilet and in the tub and shower. Do not use towel bars as grab bars. Use non-skid mats or decals in the tub or shower. If you need to sit down in the shower, use a plastic, non-slip stool. Keep the floor dry. Clean up any water that spills on the floor as soon as it happens. Remove  soap buildup in the tub or shower regularly. Attach bath mats securely with double-sided non-slip rug tape. Do not have throw rugs and other things on the floor that can make you trip. What can I do in the bedroom? Use night lights. Make sure that you have a light by your bed that is easy to reach. Do not use any sheets or blankets that are too big for your bed. They should not hang down onto the floor. Have a firm chair that has side arms. You can use this for support while you get dressed. Do not have throw rugs and other things on the floor that can make you trip. What can I do in the kitchen? Clean up any spills right away. Avoid walking on wet floors. Keep items that you use a lot in easy-to-reach places. If you need to reach something above you, use a strong step stool that has a grab bar. Keep electrical cords out of the way. Do not use floor polish or wax that makes floors slippery. If you must use wax, use non-skid floor wax. Do not have throw rugs and other things on the floor that can  make you trip. What can I do with my stairs? Do not leave any items on the stairs. Make sure that there are handrails on both sides of the stairs and use them. Fix handrails that are broken or loose. Make sure that handrails are as long as the stairways. Check any carpeting to make sure that it is firmly attached to the stairs. Fix any carpet that is loose or worn. Avoid having throw rugs at the top or bottom of the stairs. If you do have throw rugs, attach them to the floor with carpet tape. Make sure that you have a light switch at the top of the stairs and the bottom of the stairs. If you do not have them, ask someone to add them for you. What else can I do to help prevent falls? Wear shoes that: Do not have high heels. Have rubber bottoms. Are comfortable and fit you well. Are closed at the toe. Do not wear sandals. If you use a stepladder: Make sure that it is fully opened. Do not climb a  closed stepladder. Make sure that both sides of the stepladder are locked into place. Ask someone to hold it for you, if possible. Clearly mark and make sure that you can see: Any grab bars or handrails. First and last steps. Where the edge of each step is. Use tools that help you move around (mobility aids) if they are needed. These include: Canes. Walkers. Scooters. Crutches. Turn on the lights when you go into a dark area. Replace any light bulbs as soon as they burn out. Set up your furniture so you have a clear path. Avoid moving your furniture around. If any of your floors are uneven, fix them. If there are any pets around you, be aware of where they are. Review your medicines with your doctor. Some medicines can make you feel dizzy. This can increase your chance of falling. Ask your doctor what other things that you can do to help prevent falls. This information is not intended to replace advice given to you by your health care provider. Make sure you discuss any questions you have with your health care provider. Document Released: 06/17/2009 Document Revised: 01/27/2016 Document Reviewed: 09/25/2014 Elsevier Interactive Patient Education  2017 [employer].

## 2022-06-27 ENCOUNTER — Encounter: Payer: Self-pay | Admitting: Internal Medicine

## 2022-06-27 ENCOUNTER — Ambulatory Visit (INDEPENDENT_AMBULATORY_CARE_PROVIDER_SITE_OTHER): Payer: Medicare HMO | Admitting: Internal Medicine

## 2022-06-27 DIAGNOSIS — J069 Acute upper respiratory infection, unspecified: Secondary | ICD-10-CM

## 2022-06-27 DIAGNOSIS — I10 Essential (primary) hypertension: Secondary | ICD-10-CM

## 2022-06-27 NOTE — Patient Instructions (Signed)

## 2022-06-27 NOTE — Assessment & Plan Note (Signed)
Chronic Blood pressure well controlled Continue carvedilol 12.5 mg twice daily, Entresto 24-26 mg twice daily and spironolactone 25 mg daily

## 2022-06-27 NOTE — Progress Notes (Signed)
See staff message conversation below regarding perioperative device orders.   Simone Curia, RN  Tami Lin, RN I have her scheduled 07/03/22. After the apt if all goes well, we will send over the clearance.       ----- Message -----  From: Tami Lin, RN  Sent: 06/27/2022   1:44 PM EDT  To: Simone Curia, RN  Subject: RE: Periop Device Orders                       Her surgery is on 10/31. Can she be seen prior to then?  ----- Message -----  From: Simone Curia, RN  Sent: 06/27/2022   1:28 PM EDT  To: Tami Lin, RN  Subject: RE: Periop Device Orders                       Hi,   I have spoken to Dr. Curt Bears and we can clear her after her wound check due to a generator change on on 06/22/22. Her wound check with the provider is 07/06/22. We can clear her after this date pending how her incision looks.   Thanks,  Marliss Czar, RN  ----- Message -----  From: Tami Lin, RN  Sent: 06/27/2022   9:55 AM EDT  To: [facility]  Subject: Periop Device Orders                           Planned Procedure:  Right Breast Seed Localized Excisional Biospy  Surgeon:  Dr. Stark Klein  Date of Procedure:  07/04/22  Cautery will be used.  Position during surgery:  Supine   Please send documentation back to:  [facility] (Fax # [phone])

## 2022-06-27 NOTE — Assessment & Plan Note (Signed)
Acute Likely viral in nature Symptoms improving and much better Symptoms mild at this time Continue symptomatic treatment No need for antibiotics or steroids She will call if her symptoms do not continue to improve or worsen

## 2022-06-27 NOTE — Progress Notes (Addendum)
Request sent for perioperative device programming orders. Patient has a Corporate investment banker. Rep not notified yet. Awaiting electrophysiologist recommendations.

## 2022-06-27 NOTE — Pre-Procedure Instructions (Signed)
Surgical Instructions    Your procedure is scheduled on Tuesday 07/04/22.   Report to [facility] Main Entrance "A" at 09:30 A.M., then check in with the Admitting office.  Call this number if you have problems the morning of surgery:  [phone]   If you have any questions prior to your surgery date call [phone]: Open Monday-Friday 8am-4pm If you experience any cold or flu symptoms such as cough, fever, chills, shortness of breath, etc. between now and your scheduled surgery, please notify us at the above number     Remember:  Do not eat after midnight the night before your surgery  You may drink clear liquids until 08:30 A.M. the morning of your surgery.   Clear liquids allowed are: Water, Non-Citrus Juices (without pulp), Carbonated Beverages, Clear Tea, Black Coffee ONLY (NO MILK, CREAM OR POWDERED CREAMER of any kind), and Gatorade    Take these medicines the morning of surgery with A SIP OF WATER:   atorvastatin (LIPITOR)  carvedilol (COREG)  loratadine (CLARITIN)  Polyethyl Glycol-Propyl Glycol (SYSTANE)   tamoxifen (NOLVADEX)  venlafaxine XR (EFFEXOR-XR)    Take these medicines if needed:   acetaminophen (TYLENOL)   sodium chloride (OCEAN) 0.65 % SOLN nasal spray  Please follow your surgeon's instructions regarding warfarin (COUMADIN). If you have not received instructions then please contact your surgeon's office for instructions.  As of today, STOP taking any Aspirin (unless otherwise instructed by your surgeon) Aleve, Naproxen, Ibuprofen, Motrin, Advil, Goody's, BC's, all herbal medications, fish oil, and all vitamins.  WHAT DO I DO ABOUT MY DIABETES MEDICATION?   Do not take oral diabetes medicines (pills) the morning of surgery.  DO NOT TAKE JARDIANCE three days prior to surgery. Take last dose on Friday 06/30/22.  The day of surgery, do not take other diabetes injectables, including Byetta (exenatide), Bydureon (exenatide ER), Victoza (liraglutide), or  Trulicity (dulaglutide).  HOW TO MANAGE YOUR DIABETES BEFORE AND AFTER SURGERY  Why is it important to control my blood sugar before and after surgery? Improving blood sugar levels before and after surgery helps healing and can limit problems. A way of improving blood sugar control is eating a healthy diet by:  Eating less sugar and carbohydrates  Increasing activity/exercise  Talking with your doctor about reaching your blood sugar goals High blood sugars (greater than 180 mg/dL) can raise your risk of infections and slow your recovery, so you will need to focus on controlling your diabetes during the weeks before surgery. Make sure that the doctor who takes care of your diabetes knows about your planned surgery including the date and location.  How do I manage my blood sugar before surgery? Check your blood sugar at least 4 times a day, starting 2 days before surgery, to make sure that the level is not too high or low.  Check your blood sugar the morning of your surgery when you wake up and every 2 hours until you get to the Short Stay unit.  If your blood sugar is less than 70 mg/dL, you will need to treat for low blood sugar: Do not take insulin. Treat a low blood sugar (less than 70 mg/dL) with  cup of clear juice (cranberry or apple), 4 glucose tablets, OR glucose gel. Recheck blood sugar in 15 minutes after treatment (to make sure it is greater than 70 mg/dL). If your blood sugar is not greater than 70 mg/dL on recheck, call [phone] for further instructions. Report your blood sugar to the short stay  nurse when you get to Short Stay.  If you are admitted to the hospital after surgery: Your blood sugar will be checked by the staff and you will probably be given insulin after surgery (instead of oral diabetes medicines) to make sure you have good blood sugar levels. The goal for blood sugar control after surgery is 80-180 mg/dL.            Do not wear jewelry or makeup. Do  not wear lotions, powders, perfumes/cologne or deodorant. Do not shave 48 hours prior to surgery.  Men may shave face and neck. Do not bring valuables to the hospital. Do not wear nail polish, gel polish, artificial nails, or any other type of covering on natural nails (fingers and toes) If you have artificial nails or gel coating that need to be removed by a nail salon, please have this removed prior to surgery. Artificial nails or gel coating may interfere with anesthesia's ability to adequately monitor your vital signs.  [facility] is not responsible for any belongings or valuables.    Do NOT Smoke (Tobacco/Vaping)  24 hours prior to your procedure  If you use a CPAP at night, you may bring your mask for your overnight stay.   Contacts, glasses, hearing aids, dentures or partials may not be worn into surgery, please bring cases for these belongings   For patients admitted to the hospital, discharge time will be determined by your treatment team.   Patients discharged the day of surgery will not be allowed to drive home, and someone needs to stay with them for 24 hours.   SURGICAL WAITING ROOM VISITATION Patients having surgery or a procedure may have no more than 2 support people in the waiting area - these visitors may rotate.   Children under the age of 30 must have an adult with them who is not the patient. If the patient needs to stay at the hospital during part of their recovery, the visitor guidelines for inpatient rooms apply. Pre-op nurse will coordinate an appropriate time for 1 support person to accompany patient in pre-op.  This support person may not rotate.   Please refer to [email or web address] for the visitor guidelines for Inpatients (after your surgery is over and you are in a regular room).    Special instructions:    Oral Hygiene is also important to reduce your risk of infection.  Remember - BRUSH YOUR TEETH THE  MORNING OF SURGERY WITH YOUR REGULAR TOOTHPASTE   [facility]- Preparing For Surgery  Before surgery, you can play an important role. Because skin is not sterile, your skin needs to be as free of germs as possible. You can reduce the number of germs on your skin by washing with CHG (chlorahexidine gluconate) Soap before surgery.  CHG is an antiseptic cleaner which kills germs and bonds with the skin to continue killing germs even after washing.     Please do not use if you have an allergy to CHG or antibacterial soaps. If your skin becomes reddened/irritated stop using the CHG.  Do not shave (including legs and underarms) for at least 48 hours prior to first CHG shower. It is OK to shave your face.  Please follow these instructions carefully.     Shower the NIGHT BEFORE SURGERY and the MORNING OF SURGERY with CHG Soap.   If you chose to wash your hair, wash your hair first as usual with your normal shampoo. After you shampoo, rinse your hair and body thoroughly to  remove the shampoo.  Then [employer] and genitals (private parts) with your normal soap and rinse thoroughly to remove soap.  After that Use CHG Soap as you would any other liquid soap. You can apply CHG directly to the skin and wash gently with a scrungie or a clean washcloth.   Apply the CHG Soap to your body ONLY FROM THE NECK DOWN.  Do not use on open wounds or open sores. Avoid contact with your eyes, ears, mouth and genitals (private parts). Wash Face and genitals (private parts)  with your normal soap.   Wash thoroughly, paying special attention to the area where your surgery will be performed.  Thoroughly rinse your body with warm water from the neck down.  DO NOT shower/wash with your normal soap after using and rinsing off the CHG Soap.  Pat yourself dry with a CLEAN TOWEL.  Wear CLEAN PAJAMAS to bed the night before surgery  Place CLEAN SHEETS on your bed the night before your surgery  DO NOT SLEEP WITH  PETS.   Day of Surgery:  Take a shower with CHG soap. Wear Clean/Comfortable clothing the morning of surgery Do not apply any deodorants/lotions.   Remember to brush your teeth WITH YOUR REGULAR TOOTHPASTE.    If you received a COVID test during your pre-op visit, it is requested that you wear a mask when out in public, stay away from anyone that may not be feeling well, and notify your surgeon if you develop symptoms. If you have been in contact with anyone that has tested positive in the last 10 days, please notify your surgeon.    Please read over the following fact sheets that you were given.

## 2022-06-28 ENCOUNTER — Encounter (HOSPITAL_COMMUNITY)
Admission: RE | Admit: 2022-06-28 | Discharge: 2022-06-28 | Disposition: A | Discharge status: Home / Self Care | Payer: Medicare HMO | Source: Ambulatory Visit | Attending: General Surgery | Admitting: General Surgery

## 2022-06-28 ENCOUNTER — Other Ambulatory Visit: Payer: Self-pay

## 2022-06-28 ENCOUNTER — Encounter (HOSPITAL_COMMUNITY): Payer: Self-pay

## 2022-06-28 VITALS — BP 129/54 | HR 68 | Temp 97.8°F | Resp 16 | Ht 62.0 in | Wt 145.9 lb

## 2022-06-28 DIAGNOSIS — E119 Type 2 diabetes mellitus without complications: Secondary | ICD-10-CM | POA: Diagnosis not present

## 2022-06-28 DIAGNOSIS — Z01810 Encounter for preprocedural cardiovascular examination: Secondary | ICD-10-CM | POA: Insufficient documentation

## 2022-06-28 HISTORY — DX: Cardiac murmur, unspecified: R01.1

## 2022-06-28 LAB — GLUCOSE, CAPILLARY: Glucose-Capillary: 185 mg/dL — ABNORMAL HIGH (ref 70–99)

## 2022-06-28 LAB — HEMOGLOBIN A1C
Hgb A1c MFr Bld: 7.2 % — ABNORMAL HIGH (ref 4.8–5.6)
Mean Plasma Glucose: 159.94 mg/dL

## 2022-06-28 NOTE — Progress Notes (Signed)
[place], [employer] Rep returned page at [phone]. Rep given time, date, and procedure being done. Rep informed that pt is having EP clearance visit the day prior to surgery so we won't have any device orders until then. Jordy said she will still schedule the surgery for their end. Rep requested that they be notified once device orders are received. RN did explain that they need to be here at least 15 minutes prior to surgery if needed at the bedside, which in this patients case will be 1115. Rep verified instructions.

## 2022-06-28 NOTE — Progress Notes (Signed)
PCP - Dr. Billey Gosling Cardiologist - Dr. Aundra Dubin and Dr. Curt Bears  PPM/ICD - [employer] PPM Device Orders - pending. Pt is having EP clearance visit 07/03/22. We will receive device instructions once pt is cleared. Rep Notified - Rep paged 10/25. Awaiting call back.  Chest x-ray - n/a EKG - 05/23/2022 Stress Test - 2014 ECHO - 09/02/2021 Cardiac Cath - 2013  Sleep Study - Denies CPAP - n/a  Pt is DM2. She rarely checks her blood sugar at home. When she does, the result is around 180. Her CBG at PAT visit today was 185 and she has only had a diet soda today and has not taken her Vania Rea today (usually takes it at lunch time).  Last dose of GLP1 agonist- n/a GLP1 instructions: n/a  Blood Thinner Instructions: Pt received instructions to hold Coumadin three days prior to surgery. Her last dose will be 06/30/22. PT-INR will be drawn DOS since pt has not stopped medication yet. Aspirin Instructions: n/a  ERAS Protcol - Yes. Clear liquids until 0830 morning of surgery PRE-SURGERY Ensure or G2- none ordered  COVID TEST- n/a   Anesthesia review: Yes. Pending cardiac clearance from Dr. Curt Bears. Discussed with Karoline Caldwell, PA-C recent potassium result of 3.0. Pt was told to "eat more bananas." Per Jeneen Rinks, no repeat testing needed.  Patient denies shortness of breath, fever, cough and chest pain at PAT appointment   All instructions explained to the patient, with a verbal understanding of the material. Patient agrees to go over the instructions while at home for a better understanding. Patient also instructed to self quarantine after being tested for COVID-19. The opportunity to ask questions was provided.

## 2022-06-29 NOTE — Progress Notes (Signed)
Anesthesia Chart Review:  Case: [id number] Date/Time: 07/04/22 1115   Procedure: RADIOACTIVE SEED GUIDED EXCISIONAL RIGHT BREAST BIOPSY (Right: Breast)   Anesthesia type: General   Pre-op diagnosis: ABNORMAL RIGHT MAMMOGRAM   Location: [facility] OR ROOM 02 / [facility]   Surgeons: Stark Klein, MD       DISCUSSION: Patient is an 81 year old female scheduled for the above procedure. S/p left breast lumpectomy for T1cNx (due to age) breast cancer on 05/26/21. Recent mammogram showed an area of distortion inf the right beast. Core needle biopsy on 05/29/22 showed fibrocystic changes but felt discordant and above procedure recommended.   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 Jude Medical Allure Quardra RF BiV PPM 11/04/13; generator replacement 06/22/22), HTN, DM2, anemia, COPD, pulmonary HTN (no PAH on 2014 RHC; RVSP 23.8 mmHg 09/02/21 echo), asthma, spinal surgery (L3-4 microdiscectomy 06/17/18).    She underwent underwent generator replacement of her PPM 06/22/22 (Abbott Quadra Allure MP [id number] CRT-P) and has follow-up in the device clinic on 07/03/22. Her last HF office visit was on 03/03/22 with Allena Katz, [place] on 03/03/22. Volume status ok. Six month follow-up recommended.   She reported instructions to hold warfarin for 3 days prior to surgery and last dose is scheduled for 06/30/22.  RSL is scheduled for 07/03/22 at 1:30 PM.    Anesthesia team to evaluate on the day of surgery. PT/INR on arrival.    VS: BP (!) 129/54   Pulse 68   Temp 36.6 C (Oral)   Resp 16   Ht _0  (1.575 m)   Wt 66.2 kg   SpO2 99%   BMI 26.69 kg/m    PROVIDERS: Binnie Rail, MD is PCP. Evaluation on 06/27/33. Reported improving cough, runny nose x 2 weeks and wanted to make sure nothing needed prior to breast surgery. No wheezing, rales, or SOB. No antibiotics or steroids indicated as symptoms mild and improving. Follow-up if worsening symptoms.  Loralie Champagne, MD is Cardiologist - Curt Bears, Will, MD is EP (previously Thompson Grayer, MD) - Truitt Merle, MD is HEM-ONC - Ellouise Newer, MD is neurologist - Chesley Mires, MD is pulmonologist   LABS: Most recent lab results in [facility] include: Lab Results  Component Value Date   WBC 6.3 06/19/2022   HGB 13.9 06/19/2022   HCT 41.5 06/19/2022   PLT 210 06/19/2022   GLUCOSE 80 06/22/2022   ALT 10 12/29/2021   AST 14 (L) 12/29/2021   NA 145 06/22/2022   K 3.0 (L) 06/22/2022   CL 109 06/22/2022   CREATININE 0.57 06/22/2022   BUN 15 06/22/2022   CO2 24 06/22/2022   TSH 0.56 09/20/2021   INR 2.1 (H) 06/19/2022   HGBA1C 7.2 (H) 06/28/2022    PFTs 09/27/17: FVC 2.22 (90%), post 2.18 (88%), FEV1 1.66 (90%), post 1.60 (87%), DLCO unc 13.67 (63%).     EKG:  EKG 05/23/22: Electronic ventricular pacemaker.     CV: Echo 09/02/21: IMPRESSIONS   1. Left ventricular ejection fraction, by  estimation, is 45 to 50%. The  left ventricle has mildly decreased function. The left ventricle  demonstrates global hypokinesis. There is mild left ventricular  hypertrophy. Left ventricular diastolic parameters  are indeterminate.   2. Right ventricular systolic function is mildly reduced. The right  ventricular size is normal. There is normal pulmonary artery systolic  pressure. The estimated right ventricular systolic pressure is 89.1 mmHg.   3. Left atrial size was severely dilated.   4.  Right atrial size was mildly dilated.   5. S/p mitral valve repair. Trivial mitral regurgitation. No significant  stenosis with mean gradient 2 mmHg.   6. The aortic valve is tricuspid. Aortic valve regurgitation is not  visualized. No aortic stenosis is present.   7. The inferior vena cava is normal in size with greater than 50%  respiratory variability, suggesting right atrial pressure of 3 mmHg.  (Comparison 09/22/20 EF 45-50%, basal inferior and basal inferolateral akinesis, mildly reduced RVSF, RVSP 30.7 mmHg; 03/19/19: EF 50%, basal inferior and inferolateral akinesis, mildly reduced RVSF, mild TR, PASP 32 mmHg; 06/19/18 LVEF 55-60%, no regional wall motion abnormalities, RV mildly-moderately reduced, mild-moderate TR, PA peak pressure 35 mmHg)      RHC/LHC 04/30/13: Findings: RA = 4 RV =  31/2/5 PA =  33/9 (19) PCW = 8 Fick cardiac output/index = 4.8/2.6 Thermo CO/CI = 3.3/1.8 PVR = 1.5 [place] SVR = 1,330 FA sat = 92% PA sat = 66%, 67% MV gradient = 7.5 mm Hg MVA = 1.9 cm2   Ao Pressure: 108/46 (69) LV Pressure: 109/4/7 There was no signficant gradient across the aortic valve on pullback.   Left main: Normal   LAD: Large vessel gives off large first diagonal. In proximal to mid LAD at bifurcation with the diagonal there is 50-60% lesion. Otherwise normal   LCX: Large vessel. normal   RCA: Large dominant vessel. normal   LV-gram done in the RAO projection: Ejection fraction = 25% with significant dyssynchrony. No obvious MR   LIMA to LAD: atretic   Assessment: 1. Moderate 1v CAD 2. Severe LV dysfunction with evidence of dyssynchrony 3. Normal filling pressures without PAH 4. Mild to moderate mitral stenosis   Plan/Discussion: I think main issue is heart failure due to low EF and ventricular dyssynchrony. She is s/p AV node ablation and is currently RV pacing. Would recommend consideration of CRT upgrade. If remains symptomatic can consider trial of inotropic support.  D/w Dr. Ron Parker. Glori Bickers, MD)    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   Breast cancer ([place])    CAD (coronary artery disease)    LIMA to the LAD at time of mitral valve repair / LIMA atretic,, February, 2011   Cataracts, bilateral    removed bilat    Cecal cancer 2014   colon   CHF (congestive heart failure)    Chronic systolic dysfunction of left ventricle 05/21/2013   Clotting disorder    21 yrs ago blood clot in heart    Colon cancer (HCC)    Colon polyp, hyperplastic    Diabetes mellitus type II, controlled 03/02/2009  Diabetic neuropathy 02/21/2018   [facility] [place]   Diverticulosis of colon    Diverticulosis of large intestine 08/04/2002   Ejection fraction < 50%    EF 30%, New diagnosis   february, 2011, etiology not clear, consider rate related tachycardia, and not use carvedilol, catheterization no constriction, /    EF 55-60% echo, June, 2011 /     Beta blocker stopped May, 2012 with reactive airway disease.  Patient does not tolerate metoprolol.  Carvedilol stopped  /   EF 55%, echo, February, 2012  //   EF 40-45%, septal dyssynergy, hypokinesis of the a   Essential hypertension 07/07/2008   Family history of adverse reaction to anesthesia    sister had a hard time waking up after anesthesia   Family history of colon cancer 04/29/2021   GERD (gastroesophageal reflux disease)    Heart murmur    Hemorrhoids, internal    High cholesterol    History of CABG    Hyperlipidemia 07/07/2008   Lumbar radiculopathy, acute 06/17/2018   Major depressive disorder 07/19/2018   Mitral regurgitation    a. s/p repair with LAA ligation and MAZE at time of  surgery   Mitral stenosis    Mild, February, 2011, post mitral valve repair / mild, echo, June, 2011  //  Mild functional mitral stenosis, echo, February, 2012  //   mild stenosis, echo, December, 2013  //  mild, echo, August, 2014   //   Mild, echo, April, 2016    Osteopenia    left hip    Pacemaker-Medtronic 05/20/2012   Pacemaker placed September, 2013   //   upgraded to biventricular CRT pacemaker November 04, 2013    Permanent atrial fibrillation    a. s/p MAZE b. s/p AVN ablation   Personal history of colonic polyps 04/29/2021   PONV (postoperative nausea and vomiting)    Pulmonary HTN    QT prolongation    Tikosyn and Effexor. QT prolonged October 13, 2011, peak is in dose reduced from 500  to -250 twice a day   Right ventricular dysfunction    Mild to moderate, echo, February, 2011 / normalized echo, June, 2011 /  RV normal, echo, February, 2012  //   right ventricle reported as good echo, December, 2013    S/P mitral valve repair    [facility] / Maze procedure/ atrial appendage removed were tied off    Sick sinus syndrome    s/p Medtronic dual chamber PPM 05/17/12    Sleep difficulties 09/13/2019   Vitamin D deficiency 02/20/2014    Past Surgical History:  Procedure Laterality Date   ABDOMINAL HYSTERECTOMY  ?2002   AV NODE ABLATION N/A 05/28/2012   Procedure: AV NODE ABLATION;  Surgeon: Thompson Grayer, MD;  Location: [facility] CATH LAB;  Service: Cardiovascular;  Laterality: N/A;   BI-VENTRICULAR PACEMAKER UPGRADE N/A 11/04/2013   upgrade of previously implanted dual chamber pacemaker to STJ Quadra Allure CRTP by Dr Rayann Heman   BIV PACEMAKER GENERATOR CHANGEOUT N/A 06/22/2022   Procedure: BIV PACEMAKER GENERATOR CHANGEOUT;  Surgeon: Constance Haw, MD;  Location: [place] CV LAB;  Service: Cardiovascular;  Laterality: N/A;   BREAST EXCISIONAL BIOPSY Left over 10 years ago   benign   BREAST LUMPECTOMY Left 05/26/2021   Procedure: LEFT BREAST LUMPECTOMY;  Surgeon: Stark Klein,  MD;  Location: [place];  Service: General;  Laterality: Left;   BREAST SURGERY     CARDIOVERSION  02/08/2012  Procedure: CARDIOVERSION;  Surgeon: Carlena Bjornstad, MD;  Location: [place];  Service: Cardiovascular;  Laterality: N/A;   CARDIOVERSION  02/29/2012   Procedure: CARDIOVERSION;  Surgeon: Carlena Bjornstad, MD;  Location: [place];  Service: Cardiovascular;  Laterality: N/A;   CARDIOVERSION  05/15/2012   Procedure: CARDIOVERSION;  Surgeon: Carlena Bjornstad, MD;  Location: [place];  Service: Cardiovascular;  Laterality: N/A;   CATARACT EXTRACTION, BILATERAL  2017   [place]   COLON SURGERY  2014   right hemicolectomy    COLONOSCOPY     CORONARY ARTERY BYPASS GRAFT  2001   CABG X1 "at time of mitral valve repair" (12/04/2012   DIAGNOSTIC LAPAROSCOPIC LIVER BIOPSY Left 12/03/2012   Procedure: DIAGNOSTIC LAPAROSCOPIC LIVER BIOPSY;  Surgeon: Stark Klein, MD;  Location: [place];  Service: General;  Laterality: Left;   EYE SURGERY     bilateral cataract surgery   LAPAROSCOPIC RIGHT HEMI COLECTOMY  12/03/2012   Procedure: LAPAROSCOPIC RIGHT HEMI COLECTOMY;  Surgeon: Stark Klein, MD;  Location: [place];  Service: General;;   LUMBAR LAMINECTOMY/ DECOMPRESSION WITH MET-RX Left 06/17/2018   Procedure: left Lumbar three-fourextraforaminal Microdiscectomy with Met-Rx;  Surgeon: Kristeen Miss, MD;  Location: [place];  Service: Neurosurgery;  Laterality: Left;   MAZE  2001   w/ MVR & CABG   MITRAL VALVE REPAIR  2001   "anterior and posterior leaflets" (12/04/2012)   PERMANENT PACEMAKER INSERTION N/A 05/17/2012   MDT Adapta L implanted by Dr Rayann Heman for tachy/brady syndrome   POLYPECTOMY     TONSILLECTOMY AND ADENOIDECTOMY  ~ 9741   UMBILICAL HERNIA REPAIR N/A 12/03/2012   Procedure: HERNIA REPAIR UMBILICAL ADULT;  Surgeon: Stark Klein, MD;  Location: [place];  Service: General;  Laterality: N/A;    MEDICATIONS:  ACCU-CHEK SOFTCLIX LANCETS lancets   acetaminophen (TYLENOL) 500 MG tablet    amoxicillin (AMOXIL) 500 MG capsule   atorvastatin (LIPITOR) 40 MG tablet   carvedilol (COREG) 12.5 MG tablet   cholecalciferol (VITAMIN D) 1000 UNITS tablet   fish oil-omega-3 fatty acids 1000 MG capsule   JARDIANCE 25 MG TABS tablet   loratadine (CLARITIN) 10 MG tablet   Polyethyl Glycol-Propyl Glycol (SYSTANE) 0.4-0.3 % SOLN   sacubitril-valsartan (ENTRESTO) 24-26 MG   sodium chloride (OCEAN) 0.65 % SOLN nasal spray   spironolactone (ALDACTONE) 25 MG tablet   tamoxifen (NOLVADEX) 20 MG tablet   venlafaxine XR (EFFEXOR-XR) 37.5 MG 24 hr capsule   warfarin (COUMADIN) 2.5 MG tablet   No current facility-administered medications for this encounter.  Amoxicillin is PRN dental procedures.    Myra Gianotti, PA-C Surgical Short Stay/Anesthesiology [facility] Phone [phone] [facility] Phone [phone] 06/29/2022 12:49 PM

## 2022-06-29 NOTE — Anesthesia Preprocedure Evaluation (Addendum)
Anesthesia Evaluation  Patient identified by MRN, date of birth, ID band Patient awake    Reviewed: Allergy & Precautions, H&P , NPO status , Patient's Chart, lab work & pertinent test results  History of Anesthesia Complications (+) PONV and history of anesthetic complications  Airway Mallampati: II   Neck ROM: full    Dental   Pulmonary asthma ,    breath sounds clear to auscultation       Cardiovascular hypertension, + CAD, + CABG and +CHF  + dysrhythmias + pacemaker + Valvular Problems/Murmurs  Rhythm:regular Rate:Normal  S/p CABG and mitral valve repair.    Neuro/Psych PSYCHIATRIC DISORDERS Depression  Neuromuscular disease    GI/Hepatic GERD  ,  Endo/Other  diabetes, Type 2  Renal/GU      Musculoskeletal  (+) Arthritis ,   Abdominal   Peds  Hematology   Anesthesia Other Findings   Reproductive/Obstetrics                            Anesthesia Physical Anesthesia Plan  ASA: 3  Anesthesia Plan: General   Post-op Pain Management:    Induction: Intravenous  PONV Risk Score and Plan: 4 or greater and Ondansetron, Dexamethasone and Treatment may vary due to age or medical condition  Airway Management Planned: LMA  Additional Equipment:   Intra-op Plan:   Post-operative Plan: Extubation in OR  Informed Consent: I have reviewed the patients History and Physical, chart, labs and discussed the procedure including the risks, benefits and alternatives for the proposed anesthesia with the patient or authorized representative who has indicated his/her understanding and acceptance.     Dental advisory given  Plan Discussed with: CRNA, Anesthesiologist and Surgeon  Anesthesia Plan Comments: (PAT note written 06/29/2022 by Myra Gianotti, PA-C. )       Anesthesia Quick Evaluation

## 2022-06-30 ENCOUNTER — Ambulatory Visit (INDEPENDENT_AMBULATORY_CARE_PROVIDER_SITE_OTHER): Payer: Medicare HMO

## 2022-06-30 ENCOUNTER — Ambulatory Visit: Payer: Medicare HMO

## 2022-06-30 ENCOUNTER — Telehealth: Payer: Self-pay | Admitting: Internal Medicine

## 2022-06-30 DIAGNOSIS — Z7901 Long term (current) use of anticoagulants: Secondary | ICD-10-CM

## 2022-06-30 LAB — POCT INR: INR: 1.6 — AB (ref 2.0–3.0)

## 2022-06-30 NOTE — Progress Notes (Signed)
Increase dose to 1 1/2 tablets today. Then follow the below instructions:  10/28: Hold warfarin  10/29: Hold warfarin  10/30: Hold warfarin   10/31: Day of surgery, no warfarin   11/1: Take 1 1/2 tablets of warfarin (3.75 mg total)  11/2: Take 1 1/2 tablets of warfarin (3.75 mg total)  11/3: Take 1 1/2 tablets of warfarin (3.75 mg total)  11/4:  Take 1 1/2 tablets of warfarin (3.75 mg total)  11/5: Take 1 tablet of warfarin (2.5 mg total)  11/6: Take 1 1/2 tablets of warfarin (3.75 mg total)    11/7: Day of next INR check. Do not take any warfarin until after anticoagulation clinic visit.

## 2022-06-30 NOTE — Patient Instructions (Signed)
10/28: Hold warfarin  10/29: Hold warfarin  10/30: Hold warfarin   10/31: Day of surgery, no warfarin   11/1: Take 1 1/2 tablets of warfarin (3.75 mg total)  11/2: Take 1 1/2 tablets of warfarin (3.75 mg total)  11/3: Take 1 1/2 tablets of warfarin (3.75 mg total)  11/4:  Take 1 1/2 tablets of warfarin (3.75 mg total)  11/5: Take 1 tablet of warfarin (2.5 mg total)  11/6: Take 1 1/2 tablets of warfarin (3.75 mg total)    11/7: Day of next INR check. Do not take any warfarin until after anticoagulation clinic visit.

## 2022-06-30 NOTE — Telephone Encounter (Signed)
An FL2 form was faxed yesterday evening - patient is having surgery on the 31st.  [facility] needs this form completed and faxed by Monday.  Fax# [phone]

## 2022-07-03 ENCOUNTER — Telehealth: Payer: Self-pay | Admitting: Internal Medicine

## 2022-07-03 ENCOUNTER — Encounter: Payer: Self-pay | Admitting: Cardiology

## 2022-07-03 ENCOUNTER — Other Ambulatory Visit: Payer: Medicare HMO

## 2022-07-03 ENCOUNTER — Ambulatory Visit: Payer: Medicare HMO | Attending: Cardiovascular Disease

## 2022-07-03 ENCOUNTER — Ambulatory Visit
Admission: RE | Admit: 2022-07-03 | Discharge: 2022-07-03 | Disposition: A | Discharge status: Home / Self Care | Payer: Medicare HMO | Source: Ambulatory Visit | Attending: General Surgery | Admitting: General Surgery

## 2022-07-03 ENCOUNTER — Ambulatory Visit: Payer: Medicare HMO | Admitting: Nurse Practitioner

## 2022-07-03 DIAGNOSIS — I495 Sick sinus syndrome: Secondary | ICD-10-CM

## 2022-07-03 DIAGNOSIS — R928 Other abnormal and inconclusive findings on diagnostic imaging of breast: Secondary | ICD-10-CM | POA: Diagnosis not present

## 2022-07-03 LAB — CUP PACEART INCLINIC DEVICE CHECK
Battery Remaining Longevity: 90 mo
Battery Voltage: 3.13 V
Brady Statistic RA Percent Paced: 0 %
Brady Statistic RV Percent Paced: 94 %
Date Time Interrogation Session: 20231030172441
Implantable Lead Connection Status: 753985
Implantable Lead Connection Status: 753985
Implantable Lead Connection Status: 753985
Implantable Lead Implant Date: 20130913
Implantable Lead Implant Date: 20130913
Implantable Lead Implant Date: 20150303
Implantable Lead Location: 753858
Implantable Lead Location: 753859
Implantable Lead Location: 753860
Implantable Lead Model: 5076
Implantable Lead Model: 5092
Implantable Pulse Generator Implant Date: 20231019
Lead Channel Impedance Value: 375 Ohm
Lead Channel Impedance Value: 450 Ohm
Lead Channel Pacing Threshold Amplitude: 1 V
Lead Channel Pacing Threshold Amplitude: 1 V
Lead Channel Pacing Threshold Amplitude: 1.25 V
Lead Channel Pacing Threshold Amplitude: 1.25 V
Lead Channel Pacing Threshold Pulse Width: 0.5 ms
Lead Channel Pacing Threshold Pulse Width: 0.5 ms
Lead Channel Pacing Threshold Pulse Width: 0.5 ms
Lead Channel Pacing Threshold Pulse Width: 0.5 ms
Lead Channel Sensing Intrinsic Amplitude: 6.8 mV
Lead Channel Setting Pacing Amplitude: 2 V
Lead Channel Setting Pacing Amplitude: 2 V
Lead Channel Setting Pacing Pulse Width: 0.5 ms
Lead Channel Setting Pacing Pulse Width: 0.5 ms
Lead Channel Setting Sensing Sensitivity: 2 mV
Pulse Gen Model: 3562
Pulse Gen Serial Number: 8122237

## 2022-07-03 NOTE — Progress Notes (Signed)
PERIOPERATIVE PRESCRIPTION FOR IMPLANTED CARDIAC DEVICE PROGRAMMING  Patient Information: Name:  Anita Duke  DOB:  03/25/1941  MRN:  [id number]   Planned Procedure:  Right Breast Seed Localized Excisional Biospy  Surgeon:  Dr. Stark Klein  Date of Procedure:  07/04/22  Cautery will be used.  Position during surgery:  Supine    Device Information:  Clinic EP Physician:  Allegra Lai, MD   Device Type:  Pacemaker Manufacturer and Phone #:  St. Jude/Abbott: [phone] Pacemaker Dependent?:  Yes.   Date of Last Device Check:  07/03/2022 Normal Device Function?:  Yes.    Electrophysiologist's Recommendations:  Have magnet available. Provide continuous ECG monitoring when magnet is used or reprogramming is to be performed.  Procedure may interfere with device function.  Magnet should be placed over device during procedure.  Per Device Clinic Standing Orders, Damian Leavell, RN  11:21 AM 07/03/2022

## 2022-07-03 NOTE — H&P (Signed)
PROVIDER:  Georgianne Fick, MD Patient Care Team: Binnie Rail, MD as PCP - General (Internal Medicine) Georgianne Fick, MD as Consulting Provider (Surgical Oncology) Truitt Merle, MD (Hematology and Oncology) Larey Dresser, MD (Cardiovascular Disease)   MRN: [id number] DOB: 12/01/1940 DATE OF ENCOUNTER: 06/09/2022 History:      The patient is an 81 year old female who had a diagnosis of left breast cancer 04/2021.  She presented with a palpable mass in the left breast.  She always had pretty dense breasts and had 2 surgeries on her left breast by Dr. Annamaria Boots and Dr. Mendel Ryder.  This time, she was found to have a 1.1 cm mass at 3 o'clock that is spiculated.  No LAD was seen.  Core needle biopsy was performed and showed a grade 2 invasive ductal carcinoma, ER/PR positive, her 2 negative, Ki 67 5%.     I previously treated her for colon cancer.   She presented with adenocarcinoma found in a large polyp on colonoscopy 2014.  She underwent laparoscopic right hemicolectomy 12/2012.  Pathology was pT2N0 with 0/12 nodes positive.  Liver biopsy was negative for malignancy.  Her tumor was microsatellite stable.  She had her follow up colonoscopy in May 2018 by Dr. Ardis Hughs that was negative for cancer, but she had six polyps that were tubular adenomas.  CEA was redrawn and was normal.     Of note, she is on coumadin for atrial fibrillation at the direction of Dr. Aundra Dubin.     She underwent left breast lumpectomy on 05/26/21 for left breast cancer. Final path was pT1cNx (due to age).  Margins were negative.  She restarted coumadin and had a lovenox bridge.  Several days after surgery she developed significant swelling and bruising.  She called the on call doc  who set her up to be seen (05/30/21). I saw her 9/27 and aspirated around 150-160 mL old bloody drainage. I saw her again 9/30 but was only to aspirate about 15 mL.  I reattemted aspiration but wasn't able to get much out again.     The wound  completely opened up and started draining foul smelling material. She had to pack the wound initially. I applied silver nitrate and it eventually closed.     She started tamoxifen 09/2021.   Interval history: She has been doing well. At her last visit I added back effexor for hot flashes.  This is sl better.  She had her annual mammogram and the left side was ok, but the right side showed an area of distortion in the upper outer breast.  There was no ultrasound correlate.  The core needle biopsy showed fibrocystic change and was discordant.     Dx mammogram/us 05/16/2022 ACR Breast Density Category c: The breast tissue is heterogeneously dense, which may obscure small masses.   FINDINGS: Interval postlumpectomy changes left breast.   Within the anterior superior right breast there is a persistent focal area of architectural distortion.   No additional findings within either breast.   Targeted ultrasound is performed, showing no definite sonographic correlate within the anterior right breast to correspond with the mammographically identified distortion. No right axillary adenopathy.   IMPRESSION: Persistent focal distortion anterior superior right breast.   Interval lumpectomy changes left breast.   RECOMMENDATION: Stereotactic guided core needle biopsy right breast distortion.   I have discussed the findings and recommendations with the patient. If applicable, a reminder letter will be sent to the patient regarding the next appointment.  BI-RADS CATEGORY  4: Suspicious.   Review of Systems:    Review of Systems  All other systems reviewed and are negative.     Medications:          Current Outpatient Medications on File Prior to Visit  Medication Sig Dispense Refill   acetaminophen (TYLENOL) 325 MG tablet Take by mouth       atorvastatin (LIPITOR) 40 MG tablet atorvastatin 40 mg tablet       blood glucose diagnostic (ACCU-CHEK AVIVA PLUS TEST STRP) test strip  Accu-Chek Aviva Plus test strips       buPROPion (WELLBUTRIN XL) 300 MG XL tablet bupropion HCl XL 300 mg 24 hr tablet, extended release (Patient not taking: Reported on 06/28/2021)       carvediloL (COREG) 12.5 MG tablet carvedilol 12.5 mg tablet       cholecalciferol (VITAMIN D3) 1000 unit tablet Take by mouth       docosahexaenoic acid-epa 120-180 mg Cap Take 1 g by mouth 2 (two) times daily (Patient not taking: Reported on 07/14/2021)       empagliflozin (JARDIANCE) 25 mg tablet Jardiance 25 mg tablet       enoxaparin (LOVENOX) 60 mg/0.6 mL injection syringe enoxaparin 60 mg/0.6 mL subcutaneous syringe  INJECT 1 SYRINGE INTO THE SKIN DAILY FOR 7 DAYS (Patient not taking: Reported on 07/14/2021)       ENTRESTO 24-26 mg tablet Take 1 tablet by mouth 2 (two) times daily       FUROsemide (LASIX) 20 MG tablet furosemide 20 mg tablet  TAKE 1 TABLET BY MOUTH ONCE DAILY FOR 2 DAYS THEN MAY TAKE 1 TABLET AS NEEDED FOR 2 LB WEIGHT GAIN IN 24 HOURS OR 3 TO 4 POUNDS IN A WEEK (Patient not taking: Reported on 07/14/2021)       loratadine (CLARITIN) 10 mg tablet Take 10 mg by mouth once daily       omega-3 acid ethyl esters (LOVAZA) 1 gram capsule Take by mouth (Patient not taking: Reported on 07/14/2021)       potassium chloride (KLOR-CON) 10 mEq ER tablet potassium chloride ER 10 mEq tablet,extended release(part/cryst)  TAKE 1 TABLET BY MOUTH ONCE DAILY ALONG WITH LASIX FOR 2 DAYS ONLY (Patient not taking: Reported on 07/14/2021)       spironolactone (ALDACTONE) 25 MG tablet spironolactone 25 mg tablet       trandolapriL (MAVIK) 4 MG tablet trandolapril 4 mg tablet       traZODone (DESYREL) 50 MG tablet trazodone 50 mg tablet (Patient not taking: Reported on 07/14/2021)       venlafaxine (EFFEXOR-XR) 37.5 MG XR capsule Take 1 capsule (37.5 mg total) by mouth once daily 30 capsule 11   warfarin (COUMADIN) 2.5 MG tablet warfarin 2.5 mg tablet        No current facility-administered medications on file  prior to visit.      Physical Examination:    Head:   Normocephalic and atraumatic.  Eyes:    Conjunctivae are normal. Pupils are equal, round, and reactive to light. No scleral icterus.  Resp:   No respiratory distress, normal effort. Breast: left breast much smaller than right and lateral aspect skin is stuck down to chest wall.  No masses.  No LAD. No nipple change.  Right breast with small amount of old bruising.  No palpable masses.  No nipple retraction or nipple discharge.   Neurological: Alert and oriented to person, place, and time. Coordination normal.  Skin:  Skin is warm and dry. No rash noted. No diaphoretic. No erythema. No pallor. Numerous seborrheic keratoses Psychiatric: Normal mood and affect. Normal behavior. Judgment and thought content normal.      Assessment and Plan:    Abnormality of right breast on screening mammogram  (primary encounter diagnosis)   Cecal cancer (CMS-HCC)   Malignant neoplasm of upper-outer quadrant of left breast in female, estrogen receptor positive    Malignant neoplasm of central portion of left breast (CMS-HCC)     Will do seed localized excisional biopsy. Would normally get MRI on a cancer patient with these findings, but her pacer is not MR compatible.     Reviewed surgery with patient.     Pt has appt 06/2022 with Lacie at oncology Continue tamoxifen.    Tentatively plans colonoscopy next year as she had adenomatous polyps on last scope.     Milus Height, MD FACS Surgical Oncology, General Surgery, Trauma and [place] [facility], [place] [facility] [place] [facility].

## 2022-07-03 NOTE — Patient Instructions (Signed)

## 2022-07-03 NOTE — Telephone Encounter (Signed)
Form received and faxed back today.

## 2022-07-03 NOTE — Telephone Encounter (Signed)
The nurse navigator Angela Nevin from [facility] called and wanted to know if Dr. Quay Burow has signed and faxed back the patient's FL2 form for her to stay at their care center after her surgery tomorrow . The best callback number is [phone]. Angela Nevin stated that forms were faxed last Thursday to office.

## 2022-07-03 NOTE — Progress Notes (Signed)
Wound check and surgical clearance appointment. Steri-strips removed. Wound without redness or edema. Incision edges approximated, wound well healed. Normal device function. Thresholds, sensing, and impedances consistent with implant measurements. Device programmed with auto capture programmed on for extra safety margin until 3 month visit. Histogram distribution appropriate for patient and level of activity. No mode switches or high ventricular rates noted. Patient educated about wound care, arm mobility, lifting restrictions. ROV in 3 months with implanting physician.  Device function normal today and patient given release for procedure tomorrow and was communicated to oncology.

## 2022-07-03 NOTE — Telephone Encounter (Signed)
Spoke with Sharyn Lull and she is refaxing form.

## 2022-07-04 ENCOUNTER — Ambulatory Visit
Admission: RE | Admit: 2022-07-04 | Discharge: 2022-07-04 | Disposition: A | Discharge status: Home / Self Care | Payer: Medicare HMO | Source: Ambulatory Visit | Attending: General Surgery | Admitting: General Surgery

## 2022-07-04 ENCOUNTER — Encounter (HOSPITAL_COMMUNITY)
Admission: RE | Disposition: A | Discharge status: Home / Self Care | Payer: Self-pay | Source: Home / Self Care | Attending: General Surgery

## 2022-07-04 ENCOUNTER — Encounter (HOSPITAL_COMMUNITY): Payer: Self-pay | Admitting: General Surgery

## 2022-07-04 ENCOUNTER — Other Ambulatory Visit: Payer: Self-pay

## 2022-07-04 ENCOUNTER — Other Ambulatory Visit (HOSPITAL_COMMUNITY): Payer: Self-pay

## 2022-07-04 ENCOUNTER — Ambulatory Visit (HOSPITAL_COMMUNITY)
Admission: RE | Admit: 2022-07-04 | Discharge: 2022-07-04 | Disposition: A | Discharge status: Home / Self Care | Payer: Medicare HMO | Attending: General Surgery | Admitting: General Surgery

## 2022-07-04 ENCOUNTER — Ambulatory Visit (HOSPITAL_COMMUNITY): Payer: Medicare HMO | Admitting: Physician Assistant

## 2022-07-04 ENCOUNTER — Ambulatory Visit (HOSPITAL_BASED_OUTPATIENT_CLINIC_OR_DEPARTMENT_OTHER): Payer: Medicare HMO | Admitting: Physician Assistant

## 2022-07-04 DIAGNOSIS — D241 Benign neoplasm of right breast: Secondary | ICD-10-CM | POA: Insufficient documentation

## 2022-07-04 DIAGNOSIS — Z7984 Long term (current) use of oral hypoglycemic drugs: Secondary | ICD-10-CM | POA: Insufficient documentation

## 2022-07-04 DIAGNOSIS — R928 Other abnormal and inconclusive findings on diagnostic imaging of breast: Secondary | ICD-10-CM

## 2022-07-04 DIAGNOSIS — Z853 Personal history of malignant neoplasm of breast: Secondary | ICD-10-CM | POA: Diagnosis not present

## 2022-07-04 DIAGNOSIS — Z7901 Long term (current) use of anticoagulants: Secondary | ICD-10-CM | POA: Diagnosis not present

## 2022-07-04 DIAGNOSIS — Z85038 Personal history of other malignant neoplasm of large intestine: Secondary | ICD-10-CM | POA: Diagnosis not present

## 2022-07-04 DIAGNOSIS — I110 Hypertensive heart disease with heart failure: Secondary | ICD-10-CM

## 2022-07-04 DIAGNOSIS — I509 Heart failure, unspecified: Secondary | ICD-10-CM | POA: Diagnosis not present

## 2022-07-04 DIAGNOSIS — R69 Illness, unspecified: Secondary | ICD-10-CM | POA: Diagnosis not present

## 2022-07-04 DIAGNOSIS — E119 Type 2 diabetes mellitus without complications: Secondary | ICD-10-CM | POA: Diagnosis not present

## 2022-07-04 DIAGNOSIS — Z951 Presence of aortocoronary bypass graft: Secondary | ICD-10-CM | POA: Insufficient documentation

## 2022-07-04 DIAGNOSIS — Z950 Presence of cardiac pacemaker: Secondary | ICD-10-CM | POA: Diagnosis not present

## 2022-07-04 DIAGNOSIS — F32A Depression, unspecified: Secondary | ICD-10-CM | POA: Diagnosis not present

## 2022-07-04 DIAGNOSIS — Z79899 Other long term (current) drug therapy: Secondary | ICD-10-CM | POA: Insufficient documentation

## 2022-07-04 DIAGNOSIS — N62 Hypertrophy of breast: Secondary | ICD-10-CM | POA: Diagnosis not present

## 2022-07-04 DIAGNOSIS — N6489 Other specified disorders of breast: Secondary | ICD-10-CM | POA: Diagnosis not present

## 2022-07-04 DIAGNOSIS — I4891 Unspecified atrial fibrillation: Secondary | ICD-10-CM | POA: Insufficient documentation

## 2022-07-04 DIAGNOSIS — N6011 Diffuse cystic mastopathy of right breast: Secondary | ICD-10-CM | POA: Diagnosis not present

## 2022-07-04 DIAGNOSIS — I2510 Atherosclerotic heart disease of native coronary artery without angina pectoris: Secondary | ICD-10-CM | POA: Diagnosis not present

## 2022-07-04 HISTORY — PX: RADIOACTIVE SEED GUIDED EXCISIONAL BREAST BIOPSY: SHX6490

## 2022-07-04 LAB — GLUCOSE, CAPILLARY
Glucose-Capillary: 93 mg/dL (ref 70–99)
Glucose-Capillary: 91 mg/dL (ref 70–99)
Glucose-Capillary: 101 mg/dL — ABNORMAL HIGH (ref 70–99)

## 2022-07-04 LAB — APTT: aPTT: 31 seconds (ref 24–36)

## 2022-07-04 LAB — PROTIME-INR
INR: 1.5 — ABNORMAL HIGH (ref 0.8–1.2)
Prothrombin Time: 17.8 seconds — ABNORMAL HIGH (ref 11.4–15.2)

## 2022-07-04 SURGERY — RADIOACTIVE SEED GUIDED BREAST BIOPSY
Anesthesia: General | Site: Breast | Laterality: Right

## 2022-07-04 MED ORDER — OXYCODONE HCL 5 MG/5ML PO SOLN: 5.0000 mg | Freq: Once | ORAL | Status: DC | PRN

## 2022-07-04 MED ORDER — CHLORHEXIDINE GLUCONATE CLOTH 2 % EX PADS: 6.0000 | MEDICATED_PAD | Freq: Once | CUTANEOUS | Status: DC

## 2022-07-04 MED ORDER — 0.9 % SODIUM CHLORIDE (POUR BTL) OPTIME
TOPICAL | Status: DC | PRN
  Administered 2022-07-04: 1000 mL

## 2022-07-04 MED ORDER — INSULIN ASPART 100 UNIT/ML IJ SOLN: 0.0000 [IU] | INTRAMUSCULAR | Status: DC | PRN

## 2022-07-04 MED ORDER — ARTIFICIAL TEARS OPHTHALMIC OINT
TOPICAL_OINTMENT | OPHTHALMIC | Status: AC
  Filled 2022-07-04: qty 3.5

## 2022-07-04 MED ORDER — PROPOFOL 10 MG/ML IV BOLUS
INTRAVENOUS | Status: AC
  Filled 2022-07-04: qty 20

## 2022-07-04 MED ORDER — DEXAMETHASONE SODIUM PHOSPHATE 10 MG/ML IJ SOLN
INTRAMUSCULAR | Status: DC | PRN
  Administered 2022-07-04: 10 mg via INTRAVENOUS

## 2022-07-04 MED ORDER — LIDOCAINE HCL (PF) 1 % IJ SOLN
INTRAMUSCULAR | Status: AC
  Filled 2022-07-04: qty 30

## 2022-07-04 MED ORDER — FENTANYL CITRATE (PF) 250 MCG/5ML IJ SOLN
INTRAMUSCULAR | Status: AC
  Filled 2022-07-04: qty 5

## 2022-07-04 MED ORDER — PROPOFOL 500 MG/50ML IV EMUL
INTRAVENOUS | Status: DC | PRN
  Administered 2022-07-04: 100 ug/kg/min via INTRAVENOUS
  Administered 2022-07-04: 30 mg via INTRAVENOUS
  Administered 2022-07-04: 140 mg via INTRAVENOUS

## 2022-07-04 MED ORDER — OXYCODONE HCL 5 MG PO TABS
2.5000 mg | ORAL_TABLET | Freq: Four times a day (QID) | ORAL | 0 refills | Status: DC | PRN
  Filled 2022-07-04: qty 8, 2d supply, fill #0

## 2022-07-04 MED ORDER — ORAL CARE MOUTH RINSE: 15.0000 mL | Freq: Once | OROMUCOSAL | Status: AC

## 2022-07-04 MED ORDER — PROPOFOL 1000 MG/100ML IV EMUL
INTRAVENOUS | Status: AC
  Filled 2022-07-04: qty 300

## 2022-07-04 MED ORDER — FENTANYL CITRATE (PF) 250 MCG/5ML IJ SOLN
INTRAMUSCULAR | Status: DC | PRN
  Administered 2022-07-04: 50 ug via INTRAVENOUS
  Administered 2022-07-04: 25 ug via INTRAVENOUS

## 2022-07-04 MED ORDER — LIDOCAINE 2% (20 MG/ML) 5 ML SYRINGE
INTRAMUSCULAR | Status: AC
  Filled 2022-07-04: qty 5

## 2022-07-04 MED ORDER — OXYCODONE HCL 5 MG PO TABS: 5.0000 mg | ORAL_TABLET | Freq: Once | ORAL | Status: DC | PRN

## 2022-07-04 MED ORDER — BUPIVACAINE-EPINEPHRINE (PF) 0.25% -1:200000 IJ SOLN
INTRAMUSCULAR | Status: AC
  Filled 2022-07-04: qty 30

## 2022-07-04 MED ORDER — ONDANSETRON HCL 4 MG/2ML IJ SOLN
INTRAMUSCULAR | Status: DC | PRN
  Administered 2022-07-04: 4 mg via INTRAVENOUS

## 2022-07-04 MED ORDER — CHLORHEXIDINE GLUCONATE 0.12 % MT SOLN
15.0000 mL | Freq: Once | OROMUCOSAL | Status: AC
  Administered 2022-07-04: 15 mL via OROMUCOSAL
  Filled 2022-07-04: qty 15

## 2022-07-04 MED ORDER — LIDOCAINE HCL 1 % IJ SOLN
INTRAMUSCULAR | Status: DC | PRN
  Administered 2022-07-04: 40 mL via INTRAMUSCULAR

## 2022-07-04 MED ORDER — CEFAZOLIN SODIUM-DEXTROSE 2-4 GM/100ML-% IV SOLN
2.0000 g | INTRAVENOUS | Status: AC
  Administered 2022-07-04: 2 g via INTRAVENOUS
  Filled 2022-07-04: qty 100

## 2022-07-04 MED ORDER — FENTANYL CITRATE (PF) 100 MCG/2ML IJ SOLN: 25.0000 ug | INTRAMUSCULAR | Status: DC | PRN

## 2022-07-04 MED ORDER — LACTATED RINGERS IV SOLN: INTRAVENOUS | Status: DC

## 2022-07-04 MED ORDER — ACETAMINOPHEN 500 MG PO TABS
1000.0000 mg | ORAL_TABLET | ORAL | Status: AC
  Administered 2022-07-04: 1000 mg via ORAL
  Filled 2022-07-04: qty 2

## 2022-07-04 MED ORDER — ONDANSETRON HCL 4 MG/2ML IJ SOLN: 4.0000 mg | Freq: Four times a day (QID) | INTRAMUSCULAR | Status: DC | PRN

## 2022-07-04 MED ORDER — LIDOCAINE 2% (20 MG/ML) 5 ML SYRINGE
INTRAMUSCULAR | Status: DC | PRN
  Administered 2022-07-04: 60 mg via INTRAVENOUS

## 2022-07-04 MED ORDER — DEXAMETHASONE SODIUM PHOSPHATE 10 MG/ML IJ SOLN
INTRAMUSCULAR | Status: AC
  Filled 2022-07-04: qty 1

## 2022-07-04 MED ORDER — ONDANSETRON HCL 4 MG/2ML IJ SOLN
INTRAMUSCULAR | Status: AC
  Filled 2022-07-04: qty 2

## 2022-07-04 SURGICAL SUPPLY — 43 items
BAG COUNTER SPONGE SURGICOUNT (BAG) ×1 IMPLANT
BINDER BREAST LRG (GAUZE/BANDAGES/DRESSINGS) IMPLANT
BINDER BREAST XLRG (GAUZE/BANDAGES/DRESSINGS) IMPLANT
BNDG COHESIVE 4X5 TAN STRL (GAUZE/BANDAGES/DRESSINGS) ×1 IMPLANT
CANISTER SUCT 3000ML PPV (MISCELLANEOUS) ×1 IMPLANT
CHLORAPREP W/TINT 26 (MISCELLANEOUS) ×1 IMPLANT
CLIP TI LARGE 6 (CLIP) ×1 IMPLANT
CLIP TI MEDIUM 24 (CLIP) ×1 IMPLANT
CNTNR URN SCR LID CUP LEK RST (MISCELLANEOUS) IMPLANT
CONT SPEC 4OZ STRL OR WHT (MISCELLANEOUS)
COVER PROBE W GEL 5X96 (DRAPES) ×2 IMPLANT
COVER SURGICAL LIGHT HANDLE (MISCELLANEOUS) ×1 IMPLANT
DERMABOND ADVANCED .7 DNX12 (GAUZE/BANDAGES/DRESSINGS) ×1 IMPLANT
DEVICE DUBIN SPECIMEN MAMMOGRA (MISCELLANEOUS) IMPLANT
DRAPE CHEST BREAST 15X10 FENES (DRAPES) ×1 IMPLANT
DRAPE SURG 17X23 STRL (DRAPES) IMPLANT
ELECT COATED BLADE 2.86 ST (ELECTRODE) ×1 IMPLANT
ELECT REM PT RETURN 9FT ADLT (ELECTROSURGICAL) ×1
ELECTRODE REM PT RTRN 9FT ADLT (ELECTROSURGICAL) ×1 IMPLANT
GLOVE BIO SURGEON STRL SZ 6 (GLOVE) ×1 IMPLANT
GLOVE INDICATOR 6.5 STRL GRN (GLOVE) ×1 IMPLANT
GOWN STRL REUS W/ TWL LRG LVL3 (GOWN DISPOSABLE) ×1 IMPLANT
GOWN STRL REUS W/TWL 2XL LVL3 (GOWN DISPOSABLE) ×1 IMPLANT
GOWN STRL REUS W/TWL LRG LVL3 (GOWN DISPOSABLE) ×1
KIT BASIN OR (CUSTOM PROCEDURE TRAY) ×1 IMPLANT
KIT MARKER MARGIN INK (KITS) ×1 IMPLANT
LIGHT WAVEGUIDE WIDE FLAT (MISCELLANEOUS) IMPLANT
NDL 18GX1X1/2 (RX/OR ONLY) (NEEDLE) IMPLANT
NDL FILTER BLUNT 18X1 1/2 (NEEDLE) IMPLANT
NDL HYPO 25GX1X1/2 BEV (NEEDLE) ×1 IMPLANT
NEEDLE 18GX1X1/2 (RX/OR ONLY) (NEEDLE) IMPLANT
NEEDLE FILTER BLUNT 18X1 1/2 (NEEDLE) IMPLANT
NEEDLE HYPO 25GX1X1/2 BEV (NEEDLE) ×1 IMPLANT
NS IRRIG 1000ML POUR BTL (IV SOLUTION) ×1 IMPLANT
PACK GENERAL/GYN (CUSTOM PROCEDURE TRAY) ×1 IMPLANT
PACK UNIVERSAL I (CUSTOM PROCEDURE TRAY) ×1 IMPLANT
STOCKINETTE IMPERVIOUS 9X36 MD (GAUZE/BANDAGES/DRESSINGS) ×1 IMPLANT
STRIP CLOSURE SKIN 1/2X4 (GAUZE/BANDAGES/DRESSINGS) IMPLANT
SUT MNCRL AB 4-0 PS2 18 (SUTURE) ×1 IMPLANT
SUT VIC AB 3-0 SH 8-18 (SUTURE) ×1 IMPLANT
SYR CONTROL 10ML LL (SYRINGE) ×1 IMPLANT
TOWEL GREEN STERILE (TOWEL DISPOSABLE) ×1 IMPLANT
TOWEL GREEN STERILE FF (TOWEL DISPOSABLE) ×1 IMPLANT

## 2022-07-04 NOTE — Progress Notes (Signed)
ICM remote transmission rescheduled for 08/07/2022 to allow time for CorVue thoracic impedance baseline to develop after battery replacement on 10/19.

## 2022-07-04 NOTE — Op Note (Signed)
Right Breast Radioactive seed localized excisional biopsy  Indications: This patient presents with history of abnormal right mammogram with discordant core needle biopsy.    Pre-operative Diagnosis: abnormal right mammogram    Post-operative Diagnosis: abnormal right mammogram  Surgeon: Stark Klein   Anesthesia: General endotracheal anesthesia  ASA Class: 3  Procedure Details  The patient was seen in the Holding Room. The risks, benefits, complications, treatment options, and expected outcomes were discussed with the patient. The possibilities of bleeding, infection, the need for additional procedures, failure to diagnose a condition, and creating a complication requiring transfusion or operation were discussed with the patient. The patient concurred with the proposed plan, giving informed consent.  The site of surgery properly noted/marked. The patient was taken to Operating Room # 2, identified, and the procedure verified as right Breast seed localized excisional biopsy. A Time Out was held and the above information confirmed.  The right breast and chest were prepped and draped in standard fashion. A lateral circumareolar incision was made near the previously placed radioactive seed.  Dissection was carried down around the point of maximum signal intensity. The cautery was used to perform the dissection.   The specimen was inked with the margin marker paint kit.    Specimen radiography confirmed inclusion of the mammographic lesion, the clip, and the seed.  The background signal in the breast was zero.  One clip was placed in the breast cavity.  Local anesthetic was infiltrated into the skin and the breast tissue around the cavity. Hemostasis was achieved with cautery.  The wound was irrigated and closed with 3-0 vicryl interrupted deep dermal sutures and 4-0 monocryl running subcuticular suture.      Sterile dressings were applied. At the end of the operation, all sponge, instrument, and  needle counts were correct.  Findings: Seed, clip in specimen.  Anterior margin is skin  Estimated Blood Loss:  min         Specimens: right breast tissue with seed         Complications:  None; patient tolerated the procedure well.         Disposition: PACU - hemodynamically stable.         Condition: stable

## 2022-07-04 NOTE — Interval H&P Note (Signed)
History and Physical Interval Note:  07/04/2022 12:09 PM  Anita Duke  has presented today for surgery, with the diagnosis of ABNORMAL RIGHT MAMMOGRAM.  The various methods of treatment have been discussed with the patient and family. After consideration of risks, benefits and other options for treatment, the patient has consented to  Procedure(s): RADIOACTIVE SEED GUIDED EXCISIONAL RIGHT BREAST BIOPSY (Right) as a surgical intervention.  The patient's history has been reviewed, patient examined, no change in status, stable for surgery.  I have reviewed the patient's chart and labs.  Questions were answered to the patient's satisfaction.     Stark Klein

## 2022-07-04 NOTE — Discharge Instructions (Addendum)
[facility] Office Phone Number [phone]  BREAST BIOPSY/ PARTIAL MASTECTOMY: POST OP INSTRUCTIONS  Always review your discharge instruction sheet given to you by the facility where your surgery was performed.  IF YOU HAVE DISABILITY OR FAMILY LEAVE FORMS, YOU MUST BRING THEM TO THE OFFICE FOR PROCESSING.  DO NOT GIVE THEM TO YOUR DOCTOR.  Take 2 tylenol (acetominophen) three times a day for 3 days.  If you still have pain, add ibuprofen with food in between if able to take this (if you have kidney issues or stomach issues, do not take ibuprofen).  If both of those are not enough, add the narcotic pain pill.  If you find you are needing a lot of this overnight after surgery, call the next morning for a refill.    Prescriptions will not be filled after 5pm or on week-ends. Take your usually prescribed medications unless otherwise directed You should eat very light the first 24 hours after surgery, such as soup, crackers, pudding, etc.  Resume your normal diet the day after surgery. Most patients will experience some swelling and bruising in the breast.  Ice packs and a good support bra will help.  Swelling and bruising can take several days to resolve.  It is common to experience some constipation if taking pain medication after surgery.  Increasing fluid intake and taking a stool softener will usually help or prevent this problem from occurring.  A mild laxative (Milk of Magnesia or Miralax) should be taken according to package directions if there are no bowel movements after 48 hours. Unless discharge instructions indicate otherwise, you may remove your bandages 48 hours after surgery, and you may shower at that time.  You may have steri-strips (small skin tapes) in place directly over the incision.  These strips should be left on the skin at least for for 7-10 days.    ACTIVITIES:  You may resume regular daily activities (gradually increasing) beginning the next day.  Wearing a  good support bra or sports bra (or the breast binder) minimizes pain and swelling.  You may have sexual intercourse when it is comfortable. No heavy lifting for 1-2 weeks (not over around 10 pounds).  You may drive when you no longer are taking prescription pain medication, you can comfortably wear a seatbelt, and you can safely maneuver your car and apply brakes. RETURN TO WORK:  __________3-14 days depending on job. _______________ You should see your doctor in the office for a follow-up appointment approximately two weeks after your surgery.  Your doctor's nurse will typically make your follow-up appointment when she calls you with your pathology report.  Expect your pathology report 3-4 business days after your surgery.  You may call to check if you do not hear from us after three days.   WHEN TO CALL YOUR DOCTOR: Fever over 101.0 Nausea and/or vomiting. Extreme swelling or bruising. Continued bleeding from incision. Increased pain, redness, or drainage from the incision.  The clinic staff is available to answer your questions during regular business hours.  Please don't hesitate to call and ask to speak to one of the nurses for clinical concerns.  If you have a medical emergency, go to the nearest emergency room or call 911.  A surgeon from [facility] is always on call at the hospital.  For further questions, please visit [email or web address]   

## 2022-07-04 NOTE — Anesthesia Procedure Notes (Cosign Needed)
Procedure Name: LMA Insertion Date/Time: 07/04/2022 12:30 PM  Performed by: Albertha Ghee, MDPre-anesthesia Checklist: Patient identified, Emergency Drugs available, Suction available, Patient being monitored and Timeout performed Patient Re-evaluated:Patient Re-evaluated prior to induction Oxygen Delivery Method: Circle system utilized Preoxygenation: Pre-oxygenation with 100% oxygen Induction Type: IV induction Ventilation: Mask ventilation without difficulty LMA: LMA inserted LMA Size: 4.0 Number of attempts: 1 Placement Confirmation: positive ETCO2 Tube secured with: Tape

## 2022-07-04 NOTE — Transfer of Care (Cosign Needed)
Immediate Anesthesia Transfer of Care Note  Patient: Anita Duke  Procedure(s) Performed: RADIOACTIVE SEED GUIDED EXCISIONAL RIGHT BREAST BIOPSY (Right: Breast)  Patient Location: PACU  Anesthesia Type:General  Level of Consciousness: awake, alert , and patient cooperative  Airway & Oxygen Therapy: Patient Spontanous Breathing and Patient connected to face mask oxygen  Post-op Assessment: Report given to RN, Post -op Vital signs reviewed and stable, and Patient moving all extremities X 4  Post vital signs: Reviewed and stable  Last Vitals:  Vitals Value Taken Time  BP    Temp    Pulse 70 07/04/22 1329  Resp    SpO2 100 % 07/04/22 1329  Vitals shown include unvalidated device data.  Last Pain:  Vitals:   07/04/22 1051  TempSrc:   PainSc: 0-No pain         Complications: No notable events documented.

## 2022-07-05 ENCOUNTER — Encounter (HOSPITAL_COMMUNITY): Payer: Self-pay | Admitting: General Surgery

## 2022-07-06 ENCOUNTER — Encounter: Payer: Medicare HMO | Admitting: Physician Assistant

## 2022-07-06 LAB — SURGICAL PATHOLOGY

## 2022-07-07 NOTE — Anesthesia Postprocedure Evaluation (Signed)
Anesthesia Post Note  Patient: Anita Duke  Procedure(s) Performed: RADIOACTIVE SEED GUIDED EXCISIONAL RIGHT BREAST BIOPSY (Right: Breast)     Patient location during evaluation: PACU Anesthesia Type: General Level of consciousness: awake and alert Pain management: pain level controlled Vital Signs Assessment: post-procedure vital signs reviewed and stable Respiratory status: spontaneous breathing, nonlabored ventilation, respiratory function stable and patient connected to nasal cannula oxygen Cardiovascular status: blood pressure returned to baseline and stable Postop Assessment: no apparent nausea or vomiting Anesthetic complications: no   No notable events documented.  Last Vitals:  Vitals:   07/04/22 1345 07/04/22 1400  BP: 135/60 (!) 144/67  Pulse: 70 70  Resp: 10 14  Temp:  36.5 C  SpO2: 97% 96%    Last Pain:  Vitals:   07/04/22 1400  TempSrc:   PainSc: 0-No pain                 [name] S

## 2022-07-11 ENCOUNTER — Ambulatory Visit: Payer: Medicare HMO | Admitting: Nurse Practitioner

## 2022-07-11 ENCOUNTER — Other Ambulatory Visit: Payer: Medicare HMO

## 2022-07-11 ENCOUNTER — Ambulatory Visit (INDEPENDENT_AMBULATORY_CARE_PROVIDER_SITE_OTHER): Payer: Medicare HMO

## 2022-07-11 DIAGNOSIS — Z7901 Long term (current) use of anticoagulants: Secondary | ICD-10-CM

## 2022-07-11 LAB — POCT INR: INR: 1.3 — AB (ref 2.0–3.0)

## 2022-07-11 NOTE — Patient Instructions (Signed)
Increase dose today to 1 1/2 tablets and increase tomorrow to 1 1/2 tablets.   Then continue to take 1 tablet daily except take 1 1/2 tablets on Mondays.  Recheck in 2 weeks, on 07/25/22 at 9:00.

## 2022-07-11 NOTE — Progress Notes (Signed)
Increase dose today to 1 1/2 tablets and increase tomorrow to 1 1/2 tablets.   Then continue to take 1 tablet daily except take 1 1/2 tablets on Mondays.  Recheck in 2 weeks.

## 2022-07-12 ENCOUNTER — Encounter: Payer: Medicare HMO | Admitting: Cardiology

## 2022-07-12 NOTE — Progress Notes (Signed)
[place]   Telephone:([phone] Fax:(336) [phone]   Clinic Follow up Note   Patient Care Team: Binnie Rail, MD as PCP - General (Internal Medicine) Larey Dresser, MD as PCP - Cardiology (Cardiology) Carlena Bjornstad, MD as Consulting Physician (Cardiology) Inda Castle, MD (Inactive) as Consulting Physician (Gastroenterology) Cameron Sprang, MD as Consulting Physician (Neurology) Truitt Merle, MD as Consulting Physician (Hematology) Stark Klein, MD as Consulting Physician (General Surgery) Mauro Kaufmann, RN as Oncology Nurse Navigator Rockwell Germany, RN as Oncology Nurse Navigator Alla Feeling, NP as Nurse Practitioner (Nurse Practitioner) 07/13/2022  CHIEF COMPLAINT: Follow-up left breast cancer  SUMMARY OF ONCOLOGIC HISTORY: Oncology History Overview Note  Cancer Staging 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 No axillary adenopathy.     04/11/2021 Cancer Staging   Staging form: Breast, AJCC 8th Edition - Clinical stage from 04/11/2021: Stage IA (cT1c, cN0, cM0, G2, ER+, PR+, HER2-) - Signed by Truitt Merle, MD on 04/27/2021 Stage prefix: Initial diagnosis Histologic grading system: 3 grade system   04/11/2021 Pathology Results   Diagnosis Breast, left, needle core biopsy, 3:00 - INVASIVE DUCTAL CARCINOMA, SEE COMMENT. - DUCTAL CARCINOMA IN SITU. Microscopic Comment The carcinoma appears grade 2.  PROGNOSTIC INDICATORS Results: IMMUNOHISTOCHEMICAL AND MORPHOMETRIC ANALYSIS PERFORMED MANUALLY The tumor cells  are NEGATIVE for Her2 (1+). 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  No other pathogenic variants detected in [facility] CancerNext-Expanded +RNAinsight Panel. The report date is May 17, 2021.    The CancerNext-Expanded gene panel offered by [facility] and includes sequencing, rearrangement, and RNA analysis for the following 77 genes: AIP, ALK, APC, ATM, AXIN2, BAP1, BARD1, BLM, BMPR1A, BRCA1, BRCA2, BRIP1, CDC73, CDH1, CDK4, CDKN1B, CDKN2A, CHEK2, CTNNA1, DICER1, FANCC, FH, FLCN, GALNT12, KIF1B, LZTR1, MAX, MEN1, MET, MLH1, MSH2, MSH3, MSH6, MUTYH, NBN, NF1, NF2, NTHL1, PALB2, PHOX2B, PMS2, POT1, PRKAR1A, PTCH1, PTEN, RAD51C, RAD51D, RB1, RECQL, RET, SDHA, SDHAF2, SDHB, SDHC, SDHD, SMAD4, SMARCA4, SMARCB1, SMARCE1, STK11, SUFU, TMEM127, TP53, TSC1, TSC2, VHL and XRCC2 (sequencing and deletion/duplication); EGFR, EGLN1, HOXB13, KIT, MITF, PDGFRA, POLD1, and POLE (sequencing only); EPCAM and GREM1 (deletion/duplication only).    05/26/2021 Definitive Surgery   FINAL MICROSCOPIC DIAGNOSIS:   A. BREAST, LEFT, LUMPECTOMY:  - Invasive ductal carcinoma, 1.5 cm, grade 2  - Ductal carcinoma in situ, intermediate grade  - Resection margins are negative for carcinoma; closest is the posterior margin at 0.3 cm  - Patchy atypical ductal hyperplasia  - Fibrocystic change with usual ductal hyperplasia, apocrine metaplasia and calcifications  - Biopsy site changes  - See oncology table    05/26/2021 Cancer Staging   Staging form: Breast, AJCC 8th Edition - Pathologic  stage from 05/26/2021: Stage Unknown (pT1c, pNX, cM0, G2, ER+, PR+, HER2-) - Signed by Truitt Merle, MD on 06/30/2021 Stage prefix: Initial diagnosis Multigene prognostic tests performed: None Histologic grading system: 3 grade system Residual tumor (R): R0 - None   10/04/2021 Survivorship   SCP delivered in person by Cira Rue, NP     CURRENT THERAPY: Tamoxifen, starting 09/13/2021  INTERVAL HISTORY: [name] returns for follow-up as scheduled, last seen by Dr. Burr Medico 12/29/2021.  Mammogram 05/16/2022 showed interval lumpectomy changes in the left and persistent focal distortion in the right breast with no ultrasound correlate.  No right axillary adenopathy.  Path showed fibrocystic change, negative for carcinoma but this was discordant and excision was recommended.  Underwent excisional biopsy by Dr. Barry Dienes 07/04/2022, path showed organizing hematoma and a fibrous scar in the vicinity of biopsy site, fibrocystic changes with usual epithelial hyperplasia, minute intraductal papilloma and negative for ADH, ALH, DCIS, and invasive carcinoma.  She is recovering from surgery.  She continues tamoxifen, admits that she had not started Effexor and was experiencing significant hot flashes and depression, she eventually started and this has improved tremendously.  From a colon cancer standpoint, denies unintentional weight loss abdominal pain/bloating, blood in stool, or change in bowel habits.   All other systems were reviewed with the patient and are negative.  MEDICAL HISTORY:  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   Breast cancer ([place])    CAD (coronary artery disease)    LIMA to the LAD at time of  mitral valve repair / LIMA atretic,, February, 2011   Cataracts, bilateral    removed bilat    Cecal cancer 2014   colon   CHF (congestive heart failure)    Chronic systolic dysfunction of left ventricle 05/21/2013   Clotting disorder    21 yrs ago blood clot in heart    Colon cancer ([place])    Colon polyp, hyperplastic    Diabetes mellitus type II, controlled 03/02/2009   Diabetic neuropathy 02/21/2018   [facility] - [facility] [place]   Diverticulosis of colon    Diverticulosis of large intestine 08/04/2002   Ejection fraction < 50%    EF 30%, New diagnosis   february, 2011, etiology not clear, consider rate related tachycardia, and not use carvedilol, catheterization no constriction, /    EF 55-60% echo, June, 2011 /     Beta blocker stopped May, 2012 with reactive airway disease.  Patient does not tolerate metoprolol.  Carvedilol stopped  /   EF 55%, echo, February, 2012  //   EF 40-45%, septal dyssynergy, hypokinesis of the a   Essential hypertension 07/07/2008   Family history of adverse reaction to anesthesia    sister had a hard time waking up after anesthesia   Family history of colon cancer 04/29/2021   GERD (gastroesophageal reflux disease)    Heart murmur    Hemorrhoids, internal    High cholesterol    History of CABG    Hyperlipidemia 07/07/2008   Lumbar radiculopathy, acute 06/17/2018   Major depressive disorder 07/19/2018   Mitral regurgitation    a.  s/p repair with LAA ligation and MAZE at time of surgery   Mitral stenosis    Mild, February, 2011, post mitral valve repair / mild, echo, June, 2011  //  Mild functional mitral stenosis, echo, February, 2012  //   mild stenosis, echo, December, 2013  //  mild, echo, August, 2014   //   Mild, echo, April, 2016    Osteopenia    left hip    Pacemaker-Medtronic 05/20/2012   Pacemaker placed September, 2013   //   upgraded to biventricular CRT pacemaker November 04, 2013    Permanent atrial fibrillation    a. s/p MAZE b. s/p AVN  ablation   Personal history of colonic polyps 04/29/2021   PONV (postoperative nausea and vomiting)    Pulmonary HTN    QT prolongation    Tikosyn and Effexor. QT prolonged October 13, 2011, peak is in dose reduced from 500  to -250 twice a day   Right ventricular dysfunction    Mild to moderate, echo, February, 2011 / normalized echo, June, 2011 /  RV normal, echo, February, 2012  //   right ventricle reported as good echo, December, 2013    S/P mitral valve repair    [facility] / Maze procedure/ atrial appendage removed were tied off    Sick sinus syndrome    s/p Medtronic dual chamber PPM 05/17/12    Sleep difficulties 09/13/2019   Vitamin D deficiency 02/20/2014    SURGICAL HISTORY: Past Surgical History:  Procedure Laterality Date   ABDOMINAL HYSTERECTOMY  ?2002   AV NODE ABLATION N/A 05/28/2012   Procedure: AV NODE ABLATION;  Surgeon: Thompson Grayer, MD;  Location: [facility] CATH LAB;  Service: Cardiovascular;  Laterality: N/A;   BI-VENTRICULAR PACEMAKER UPGRADE N/A 11/04/2013   upgrade of previously implanted dual chamber pacemaker to STJ Quadra Allure CRTP by Dr Rayann Heman   BIV PACEMAKER GENERATOR CHANGEOUT N/A 06/22/2022   Procedure: BIV PACEMAKER GENERATOR CHANGEOUT;  Surgeon: Constance Haw, MD;  Location: [place] CV LAB;  Service: Cardiovascular;  Laterality: N/A;   BREAST EXCISIONAL BIOPSY Left over 10 years ago   benign   BREAST LUMPECTOMY Left 05/26/2021   Procedure: LEFT BREAST LUMPECTOMY;  Surgeon: Stark Klein, MD;  Location: [place];  Service: General;  Laterality: Left;   BREAST SURGERY     CARDIOVERSION  02/08/2012   Procedure: CARDIOVERSION;  Surgeon: Carlena Bjornstad, MD;  Location: [place];  Service: Cardiovascular;  Laterality: N/A;   CARDIOVERSION  02/29/2012   Procedure: CARDIOVERSION;  Surgeon: Carlena Bjornstad, MD;  Location: [place];  Service: Cardiovascular;  Laterality: N/A;   CARDIOVERSION  05/15/2012   Procedure: CARDIOVERSION;  Surgeon: Carlena Bjornstad, MD;   Location: [place];  Service: Cardiovascular;  Laterality: N/A;   CATARACT EXTRACTION, BILATERAL  2017   [place]   COLON SURGERY  2014   right hemicolectomy    COLONOSCOPY     CORONARY ARTERY BYPASS GRAFT  2001   CABG X1 "at time of mitral valve repair" (12/04/2012   DIAGNOSTIC LAPAROSCOPIC LIVER BIOPSY Left 12/03/2012   Procedure: DIAGNOSTIC LAPAROSCOPIC LIVER BIOPSY;  Surgeon: Stark Klein, MD;  Location: [place];  Service: General;  Laterality: Left;   EYE SURGERY     bilateral cataract surgery   LAPAROSCOPIC RIGHT HEMI COLECTOMY  12/03/2012   Procedure: LAPAROSCOPIC RIGHT HEMI COLECTOMY;  Surgeon: Stark Klein, MD;  Location: [place];  Service: General;;   LUMBAR LAMINECTOMY/ DECOMPRESSION WITH MET-RX Left 06/17/2018  Procedure: left Lumbar three-fourextraforaminal Microdiscectomy with Met-Rx;  Surgeon: Kristeen Miss, MD;  Location: [place];  Service: Neurosurgery;  Laterality: Left;   MAZE  2001   w/ MVR & CABG   MITRAL VALVE REPAIR  2001   "anterior and posterior leaflets" (12/04/2012)   PERMANENT PACEMAKER INSERTION N/A 05/17/2012   MDT Adapta L implanted by Dr Rayann Heman for tachy/brady syndrome   POLYPECTOMY     RADIOACTIVE SEED GUIDED EXCISIONAL BREAST BIOPSY Right 07/04/2022   Procedure: RADIOACTIVE SEED GUIDED EXCISIONAL RIGHT BREAST BIOPSY;  Surgeon: Stark Klein, MD;  Location: [place];  Service: General;  Laterality: Right;   TONSILLECTOMY AND ADENOIDECTOMY  ~ 2409   UMBILICAL HERNIA REPAIR N/A 12/03/2012   Procedure: HERNIA REPAIR UMBILICAL ADULT;  Surgeon: Stark Klein, MD;  Location: [place];  Service: General;  Laterality: N/A;    I have reviewed the social history and family history with the patient and they are unchanged from previous note.  ALLERGIES:  is allergic to demerol [meperidine], morphine, januvia [sitagliptin], metformin and related, and tetanus toxoid.  MEDICATIONS:  Current Outpatient Medications  Medication Sig Dispense Refill   ACCU-CHEK  SOFTCLIX LANCETS lancets USE TO CHECK BLOOD SUGARS  TWICE A DAY 200 each 2   acetaminophen (TYLENOL) 500 MG tablet Take 500 mg by mouth every 6 (six) hours as needed for moderate pain or mild pain.     atorvastatin (LIPITOR) 40 MG tablet Take 1 tablet (40 mg total) by mouth daily. 90 tablet 3   carvedilol (COREG) 12.5 MG tablet Take 1 tablet (12.5 mg total) by mouth 2 (two) times daily with a meal. 180 tablet 3   cholecalciferol (VITAMIN D) 1000 UNITS tablet Take 1,000 Units by mouth daily.      cyanocobalamin (VITAMIN B12) 500 MCG tablet Take 500 mcg by mouth daily.     fish oil-omega-3 fatty acids 1000 MG capsule Take 1 g by mouth 2 (two) times daily.     JARDIANCE 25 MG TABS tablet TAKE 1 TABLET BY MOUTH  DAILY 90 tablet 3   loratadine (CLARITIN) 10 MG tablet Take 10 mg by mouth daily.     Polyethyl Glycol-Propyl Glycol (SYSTANE) 0.4-0.3 % SOLN Place 1 drop into both eyes daily.     sacubitril-valsartan (ENTRESTO) 24-26 MG Take 1 tablet by mouth 2 (two) times daily. 60 tablet 6   sodium chloride (OCEAN) 0.65 % SOLN nasal spray Place 1 spray into both nostrils 2 (two) times daily.     spironolactone (ALDACTONE) 25 MG tablet Take 1 tablet (25 mg total) by mouth daily. 90 tablet 3   warfarin (COUMADIN) 2.5 MG tablet TAKE 1 TABLET BY MOUTH DAILY OR AS DIRECTED BY ANTICOAGULATION  CLINIC (Patient taking differently: Take 2.5 mg by mouth every evening.  OR AS DIRECTED BY ANTICOAGULATION  CLINIC) 104 tablet 3   amoxicillin (AMOXIL) 500 MG capsule TAKE 4 CAPSULES PRIOR TO DENTAL APPOINTMENTS AS DIRECTED (Patient not taking: Reported on 07/13/2022) 12 capsule 0   oxyCODONE (OXY IR/ROXICODONE) 5 MG immediate release tablet Take 0.5-1 tablets (2.5-5 mg total) by mouth every 6 (six) hours as needed for severe pain. (Patient not taking: Reported on 07/13/2022) 8 tablet 0   tamoxifen (NOLVADEX) 20 MG tablet Take 1 tablet (20 mg total) by mouth daily. 90 tablet 2   venlafaxine XR (EFFEXOR-XR) 37.5 MG 24 hr  capsule Take 1 capsule (37.5 mg total) by mouth daily. 90 capsule 2   No current facility-administered medications for this visit.  PHYSICAL EXAMINATION: ECOG PERFORMANCE STATUS: 1 - Symptomatic but completely ambulatory  Vitals:   07/13/22 0958  BP: (!) 142/52  Pulse: 87  Resp: 18  Temp: 98 F (36.7 C)  SpO2: 100%   Filed Weights   07/13/22 0958  Weight: 143 lb 3.2 oz (65 kg)    GENERAL:alert, no distress and comfortable SKIN: Multiple seborrheic keratoses EYES:sclera clear NECK: Without mass LYMPH:  no palpable cervical or supraclavicular lymphadenopathy LUNGS: clear with normal breathing effort HEART: regular rate & rhythm, no lower extremity edema ABDOMEN:abdomen soft, non-tender and normal bowel sounds NEURO: alert & oriented x 3 with fluent speech Breast exam: No nipple discharge or inversion.  S/p left lumpectomy, incisions completely healed with distortion and scar tissue.  S/p right breast excisional biopsy with tape in place, mild surrounding rash.  No palpable mass in either breast or axilla that I could appreciate.  LABORATORY DATA:  I have reviewed the data as listed    Latest Ref Rng & Units 07/13/2022    9:11 AM 06/19/2022    2:35 PM 12/29/2021    1:00 PM  CBC  WBC 4.0 - 10.5 K/uL 4.6  6.3  4.9   Hemoglobin 12.0 - 15.0 g/dL 14.2  13.9  13.2   Hematocrit 36.0 - 46.0 % 44.0  41.5  41.2   Platelets 150 - 400 K/uL 169  210  161         Latest Ref Rng & Units 07/13/2022   10:40 AM 06/22/2022    3:12 PM 06/19/2022    2:35 PM  CMP  Glucose 70 - 99 mg/dL 158  80  149   BUN 8 - 23 mg/dL _0 Creatinine 0.44 - 1.00 mg/dL 0.64  0.57  0.59   Sodium 135 - 145 mmol/L 142  145  144   Potassium 3.5 - 5.1 mmol/L 4.3  3.0  3.3   Chloride 98 - 111 mmol/L 109  109  107   CO2 22 - 32 mmol/L _1 Calcium 8.9 - 10.3 mg/dL 8.8  9.1  9.4   Total Protein 6.5 - 8.1 g/dL 6.2     Total Bilirubin 0.3 - 1.2 mg/dL 0.7     Alkaline Phos 38 - 126 U/L 59      AST 15 - 41 U/L 13     ALT 0 - 44 U/L 9         RADIOGRAPHIC STUDIES: I have personally reviewed the radiological images as listed and agreed with the findings in the report. No results found.   ASSESSMENT & PLAN: Anita Duke is a 81 y.o. post-hysterectomy female with    1. 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 Otherwise, no mutations. -s/p lumpectomy on 05/26/21 with Dr. Barry Dienes showing: IDC, 1.5 cm, grade 2; DCIS, intermediate grade; resection margins negative. -she began tamoxifen on 09/13/21, tolerating well, hot flashes better on effexor.  -Routine mammogram 05/16/2022 showed interval lumpectomy changes in the left and focal distortion in the right breast with no ultrasound correlate.  No right axillary adenopathy.  Path showed fibrocystic change, negative for carcinoma but this was discordant.  She underwent excisional biopsy by Dr. Barry Dienes 07/04/2022, path showed organizing hematoma and a  fibrous scar in the vicinity of biopsy site, fibrocystic changes with usual epithelial hyperplasia, minute intraductal papilloma and negative for ADH, ALH, DCIS, and invasive carcinoma.   -Start appears well and is recovering from surgery.  She continues tamoxifen, tolerating well with managed hot flashes on Effexor -Not a candidate for MRI, I offered 37-monthrepeat mammogram, is interested and agrees -Continue surveillance, tamoxifen -Follow-up in 6 months, or sooner if needed   2. Osteopenia -Her most recent DEXA was 03/31/21 showing -1.4. -We discussed tamoxifen can strengthen the bones. -Repeat 03/2023   3. Depression -she previously weaned off Wellbutrin in order to start tamoxifen. However, she did not start a different  medication, thus her depression worsened. -I prescribed Effexor 10/04/21 she did not start until this fall, depression has improved   4. H/o Cecal Adenocarcinoma, stage I p(T2, N0) -found on screening colonoscopy -s/p partial colectomy on 12/03/12 by Dr. BBarry Dienes-evaluated by Dr. MJana Hakim no further treatment needed. -Exam is benign, CEA is pending   Plan: -Send imaging, op note, and path reviewed -Continue breast cancer surveillance and tamoxifen -652-monthammo for close monitoring -Follow-up in 6 months, or sooner if needed    Orders Placed This Encounter  Procedures   MM DIAG BREAST TOMO BILATERAL    Standing Status:   Future    Standing Expiration Date:   07/14/2023    Order Specific Question:   Reason for Exam (SYMPTOM  OR DIAGNOSIS REQUIRED)    Answer:   h/o L breast cancer and abnormal R mammo 10/23 requiring biopsy. close screening. not a candidate for MRI    Order Specific Question:   [facility]?    [facility] All questions were answered. The patient knows to call the clinic with any problems, questions or concerns. No barriers to learning was detected. I spent 20 minutes counseling the patient face to face. The total time spent in the appointment was 30 minutes and more than 50% was on counseling and chart review.      LaAlla FeelingNP 07/13/22

## 2022-07-13 ENCOUNTER — Other Ambulatory Visit: Payer: Self-pay

## 2022-07-13 ENCOUNTER — Encounter: Payer: Self-pay | Admitting: Nurse Practitioner

## 2022-07-13 ENCOUNTER — Inpatient Hospital Stay: Payer: Medicare HMO | Attending: Nurse Practitioner

## 2022-07-13 ENCOUNTER — Inpatient Hospital Stay: Payer: Medicare HMO | Admitting: Nurse Practitioner

## 2022-07-13 VITALS — BP 142/52 | HR 87 | Temp 98.0°F | Resp 18 | Wt 143.2 lb

## 2022-07-13 DIAGNOSIS — C50112 Malignant neoplasm of central portion of left female breast: Secondary | ICD-10-CM | POA: Insufficient documentation

## 2022-07-13 DIAGNOSIS — F32A Depression, unspecified: Secondary | ICD-10-CM | POA: Insufficient documentation

## 2022-07-13 DIAGNOSIS — R69 Illness, unspecified: Secondary | ICD-10-CM | POA: Diagnosis not present

## 2022-07-13 DIAGNOSIS — I10 Essential (primary) hypertension: Secondary | ICD-10-CM

## 2022-07-13 DIAGNOSIS — Z79899 Other long term (current) drug therapy: Secondary | ICD-10-CM | POA: Insufficient documentation

## 2022-07-13 DIAGNOSIS — Z170 Estrogen receptor positive status [ER+]: Secondary | ICD-10-CM | POA: Insufficient documentation

## 2022-07-13 DIAGNOSIS — C180 Malignant neoplasm of cecum: Secondary | ICD-10-CM

## 2022-07-13 DIAGNOSIS — Z85038 Personal history of other malignant neoplasm of large intestine: Secondary | ICD-10-CM | POA: Diagnosis not present

## 2022-07-13 DIAGNOSIS — E782 Mixed hyperlipidemia: Secondary | ICD-10-CM

## 2022-07-13 DIAGNOSIS — M8580 Other specified disorders of bone density and structure, unspecified site: Secondary | ICD-10-CM | POA: Insufficient documentation

## 2022-07-13 DIAGNOSIS — I4891 Unspecified atrial fibrillation: Secondary | ICD-10-CM

## 2022-07-13 DIAGNOSIS — I2510 Atherosclerotic heart disease of native coronary artery without angina pectoris: Secondary | ICD-10-CM

## 2022-07-13 LAB — CBC WITH DIFFERENTIAL/PLATELET
Abs Immature Granulocytes: 0.01 10*3/uL (ref 0.00–0.07)
Basophils Absolute: 0.1 10*3/uL (ref 0.0–0.1)
Basophils Relative: 2 %
Eosinophils Absolute: 0.3 10*3/uL (ref 0.0–0.5)
Eosinophils Relative: 6 %
HCT: 44 % (ref 36.0–46.0)
Hemoglobin: 14.2 g/dL (ref 12.0–15.0)
Immature Granulocytes: 0 %
Lymphocytes Relative: 23 %
Lymphs Abs: 1.1 10*3/uL (ref 0.7–4.0)
MCH: 30.5 pg (ref 26.0–34.0)
MCHC: 32.3 g/dL (ref 30.0–36.0)
MCV: 94.4 fL (ref 80.0–100.0)
Monocytes Absolute: 0.5 10*3/uL (ref 0.1–1.0)
Monocytes Relative: 12 %
Neutro Abs: 2.6 10*3/uL (ref 1.7–7.7)
Neutrophils Relative %: 57 %
Platelets: 169 10*3/uL (ref 150–400)
RBC: 4.66 MIL/uL (ref 3.87–5.11)
RDW: 13.7 % (ref 11.5–15.5)
WBC: 4.6 10*3/uL (ref 4.0–10.5)
nRBC: 0 % (ref 0.0–0.2)

## 2022-07-13 LAB — CMP (CANCER CENTER ONLY)
ALT: 9 U/L (ref 0–44)
AST: 13 U/L — ABNORMAL LOW (ref 15–41)
Albumin: 4.1 g/dL (ref 3.5–5.0)
Alkaline Phosphatase: 59 U/L (ref 38–126)
Anion gap: 5 (ref 5–15)
BUN: 19 mg/dL (ref 8–23)
CO2: 28 mmol/L (ref 22–32)
Calcium: 8.8 mg/dL — ABNORMAL LOW (ref 8.9–10.3)
Chloride: 109 mmol/L (ref 98–111)
Creatinine: 0.64 mg/dL (ref 0.44–1.00)
GFR, Estimated: >60 mL/min (ref >60)
Glucose, Bld: 158 mg/dL — ABNORMAL HIGH (ref 70–99)
Potassium: 4.3 mmol/L (ref 3.5–5.1)
Sodium: 142 mmol/L (ref 135–145)
Total Bilirubin: 0.7 mg/dL (ref 0.3–1.2)
Total Protein: 6.2 g/dL — ABNORMAL LOW (ref 6.5–8.1)

## 2022-07-13 LAB — CEA (IN HOUSE-CHCC): CEA (CHCC-In House): 4.15 ng/mL (ref 0.00–5.00)

## 2022-07-13 MED ORDER — VENLAFAXINE HCL ER 37.5 MG PO CP24: 37.5000 mg | ORAL_CAPSULE | Freq: Every day | ORAL | 2 refills | Status: DC

## 2022-07-13 MED ORDER — TAMOXIFEN CITRATE 20 MG PO TABS: 20.0000 mg | ORAL_TABLET | Freq: Every day | ORAL | 2 refills | Status: DC

## 2022-07-14 ENCOUNTER — Other Ambulatory Visit: Payer: Self-pay | Admitting: Nurse Practitioner

## 2022-07-14 DIAGNOSIS — C50112 Malignant neoplasm of central portion of left female breast: Secondary | ICD-10-CM

## 2022-07-14 NOTE — Telephone Encounter (Signed)
Refilled yesterday. Juanluis Guastella M Sui Kasparek, RN  

## 2022-07-18 ENCOUNTER — Encounter (HOSPITAL_COMMUNITY): Payer: Self-pay

## 2022-07-18 ENCOUNTER — Other Ambulatory Visit (HOSPITAL_COMMUNITY): Payer: Self-pay

## 2022-07-18 DIAGNOSIS — L249 Irritant contact dermatitis, unspecified cause: Secondary | ICD-10-CM | POA: Diagnosis not present

## 2022-07-18 DIAGNOSIS — Z85828 Personal history of other malignant neoplasm of skin: Secondary | ICD-10-CM | POA: Diagnosis not present

## 2022-07-18 DIAGNOSIS — D1801 Hemangioma of skin and subcutaneous tissue: Secondary | ICD-10-CM | POA: Diagnosis not present

## 2022-07-18 DIAGNOSIS — L821 Other seborrheic keratosis: Secondary | ICD-10-CM | POA: Diagnosis not present

## 2022-07-18 DIAGNOSIS — L570 Actinic keratosis: Secondary | ICD-10-CM | POA: Diagnosis not present

## 2022-07-18 MED ORDER — MOMETASONE FUROATE 0.1 % EX OINT
TOPICAL_OINTMENT | CUTANEOUS | 0 refills | Status: DC
  Filled 2022-07-18: qty 15, 30d supply, fill #0

## 2022-07-19 ENCOUNTER — Encounter: Payer: Self-pay | Admitting: Internal Medicine

## 2022-07-19 ENCOUNTER — Other Ambulatory Visit (HOSPITAL_COMMUNITY): Payer: Self-pay

## 2022-07-19 DIAGNOSIS — E119 Type 2 diabetes mellitus without complications: Secondary | ICD-10-CM | POA: Diagnosis not present

## 2022-07-19 DIAGNOSIS — Z961 Presence of intraocular lens: Secondary | ICD-10-CM | POA: Diagnosis not present

## 2022-07-19 DIAGNOSIS — H52203 Unspecified astigmatism, bilateral: Secondary | ICD-10-CM | POA: Diagnosis not present

## 2022-07-19 LAB — HM DIABETES EYE EXAM

## 2022-07-20 NOTE — Progress Notes (Signed)
Outside notes received. Information abstracted. Notes sent to scan.  

## 2022-07-21 ENCOUNTER — Telehealth: Payer: Self-pay

## 2022-07-21 ENCOUNTER — Other Ambulatory Visit: Payer: Self-pay

## 2022-07-21 NOTE — Telephone Encounter (Signed)
Received call from [facility] about med interaction for Sigmund Hazel, her Tamoxifen '20mg'$  and Warfarin 2.'5mg'$  causing an increased chance of bleeding. Spoke with Dr. Burr Medico made her aware she stated that they could fill the meds and for me to contact Dr. Quay Burow who prescribes her Warfarin, to make sure Warfarin levels were being closely monitored.   Message to Dr. Quay Burow: Dr. Quay Burow this is Jenny Reichmann CMA for Dr. Burr Medico we have a mutual patient Ahlana Slaydon DOB 1940/11/10 that she has started Tamoxifen '20mg'$  on and the pharmacy called stated that it will interact with her Warfarin 2.'5mg'$  and could cause increased bleeding. Dr. Burr Medico wanted me to let you know so that you could monitor her Warfarin levels to make sure their are no issues. Thank you Jenny Reichmann CMA

## 2022-07-24 ENCOUNTER — Telehealth: Payer: Self-pay | Admitting: Cardiology

## 2022-07-24 ENCOUNTER — Telehealth: Payer: Self-pay

## 2022-07-24 NOTE — Telephone Encounter (Signed)
Patient called stating she wants to cancel her 12/4 remote transmission appt.

## 2022-07-24 NOTE — Telephone Encounter (Signed)
Returned call to patient as requested.  Pt concerned about the increasing cost of remote transmissions and becoming a financial burden.  She is having difficulty speaking with billing department and gets different answers about the rising cost.  She would like to cancel all remote transmissions.  Advised she will be required in office visits if sh declines remote transmissions.  Advised I would check with billing department for cost increase and call her back before canceling all remote transmissions and she was agreeable.

## 2022-07-25 ENCOUNTER — Ambulatory Visit (INDEPENDENT_AMBULATORY_CARE_PROVIDER_SITE_OTHER): Payer: Medicare HMO

## 2022-07-25 DIAGNOSIS — Z7901 Long term (current) use of anticoagulants: Secondary | ICD-10-CM

## 2022-07-25 LAB — POCT INR: INR: 1.8 — AB (ref 2.0–3.0)

## 2022-07-25 NOTE — Patient Instructions (Addendum)
Increase dose today to 1 1/2 tablets.   Then change weekly dose to 1 tablet daily except take 1 1/2 tablets on Mondays and Fridays. Recheck on 12/12 at 8:30.

## 2022-07-25 NOTE — Progress Notes (Signed)
Pt reports that she has been taking Tamoxifen 20 mg daily since January. The alert from pharmacy came when she recently switched pharmacies.   Increase dose today to 1 1/2 tablets.   Then change weekly dose to 1 tablet daily except take 1 1/2 tablets on Mondays and Fridays. Recheck in 3 weeks.

## 2022-07-31 NOTE — Telephone Encounter (Signed)
I spoke with the patient and she does not want to do remote monitoring due to cost. She is agreeable to come into the office every 6 months. I canceled all upcoming remotes.

## 2022-07-31 NOTE — Telephone Encounter (Signed)
Spoke with patient and advised if billing information received from billing department.  Explained 3 month remotes are covered by [employer] and should not any out of pocket.  Advised [employer] covers monthly ICM checks but charges copay for one code and $50 for the most recent code that was added in September.  She has decided to cancel all remote checks and advised it is required she have her pacemaker checked in the office every 6 months and she agreed.  She has a office check scheduled for January.  Advised to call the [address] office number and press number for billing department to discuss the information that I provided to her today.    Sent to device clinic informing to cancel all remote transmissions due to financial burden and patient will have in office check every 6 months for her pacemaker.

## 2022-08-03 ENCOUNTER — Telehealth: Payer: Self-pay

## 2022-08-03 ENCOUNTER — Other Ambulatory Visit (HOSPITAL_COMMUNITY): Payer: Self-pay

## 2022-08-03 MED ORDER — AMOXICILLIN-POT CLAVULANATE 875-125 MG PO TABS
1.0000 | ORAL_TABLET | Freq: Two times a day (BID) | ORAL | 0 refills | Status: DC
  Filled 2022-08-03: qty 20, 10d supply, fill #0

## 2022-08-03 MED ORDER — HYDROCODONE BIT-HOMATROP MBR 5-1.5 MG/5ML PO SOLN
5.0000 mL | Freq: Three times a day (TID) | ORAL | 0 refills | Status: DC | PRN
  Filled 2022-08-03: qty 120, 8d supply, fill #0

## 2022-08-03 NOTE — Addendum Note (Signed)
Addended by: Binnie Rail on: 08/03/2022 05:06 PM   Modules accepted: Orders

## 2022-08-03 NOTE — Telephone Encounter (Signed)
Pt states she is having the same sxs from her last visit on 06/27/2022 and was not given any meds to tx the cough and congestion. Pt states she would like something sent in or for a call.  I offered the pt an OV for Tuesday she asked that I inform provider of her concerns.

## 2022-08-04 ENCOUNTER — Other Ambulatory Visit (HOSPITAL_COMMUNITY): Payer: Self-pay

## 2022-08-04 NOTE — Telephone Encounter (Signed)
Spoke with patient this morning.

## 2022-08-07 ENCOUNTER — Telehealth: Payer: Self-pay | Admitting: Internal Medicine

## 2022-08-07 NOTE — Telephone Encounter (Signed)
Patient last seen 06/27/2022 - Patient needs her Jar[place] 25 mg  - patient just wants 30 - Please send to [place] [facility] -

## 2022-08-08 ENCOUNTER — Other Ambulatory Visit: Payer: Self-pay

## 2022-08-08 MED ORDER — EMPAGLIFLOZIN 25 MG PO TABS: 25.0000 mg | ORAL_TABLET | Freq: Every day | ORAL | 3 refills | Status: DC

## 2022-08-08 NOTE — Telephone Encounter (Signed)
Sent in today 

## 2022-08-10 ENCOUNTER — Other Ambulatory Visit: Payer: Self-pay

## 2022-08-10 MED ORDER — EMPAGLIFLOZIN 25 MG PO TABS: 25.0000 mg | ORAL_TABLET | Freq: Every day | ORAL | 3 refills | Status: DC

## 2022-08-10 NOTE — Telephone Encounter (Signed)
Pt is requesting a 30 day supply only, the 90 day supply is too expensive.   Please re-send [employer] for 30 days only.

## 2022-08-10 NOTE — Telephone Encounter (Signed)
30 day sent in

## 2022-08-15 ENCOUNTER — Ambulatory Visit (INDEPENDENT_AMBULATORY_CARE_PROVIDER_SITE_OTHER): Payer: Medicare HMO

## 2022-08-15 DIAGNOSIS — Z7901 Long term (current) use of anticoagulants: Secondary | ICD-10-CM | POA: Diagnosis not present

## 2022-08-15 LAB — POCT INR: INR: 2.5 (ref 2.0–3.0)

## 2022-08-15 NOTE — Patient Instructions (Addendum)
Pre visit review using our clinic review tool, if applicable. No additional management support is needed unless otherwise documented below in the visit note.  Continue 1 tablet daily except take 1 1/2 tablets on Mondays and Fridays. Recheck in 4 weeks.

## 2022-08-15 NOTE — Progress Notes (Signed)
Continue 1 tablet daily except take 1 1/2 tablets on Mondays and Fridays. Recheck in 4 weeks.

## 2022-08-21 DIAGNOSIS — L237 Allergic contact dermatitis due to plants, except food: Secondary | ICD-10-CM | POA: Diagnosis not present

## 2022-08-21 DIAGNOSIS — Z85828 Personal history of other malignant neoplasm of skin: Secondary | ICD-10-CM | POA: Diagnosis not present

## 2022-09-05 ENCOUNTER — Other Ambulatory Visit (HOSPITAL_COMMUNITY): Payer: Self-pay

## 2022-09-07 ENCOUNTER — Encounter (HOSPITAL_COMMUNITY): Payer: Self-pay | Admitting: Cardiology

## 2022-09-07 ENCOUNTER — Ambulatory Visit (HOSPITAL_COMMUNITY)
Admission: RE | Admit: 2022-09-07 | Discharge: 2022-09-07 | Disposition: A | Discharge status: Home / Self Care | Payer: Medicare HMO | Source: Ambulatory Visit | Attending: Cardiology | Admitting: Cardiology

## 2022-09-07 VITALS — BP 140/70 | HR 70 | Wt 144.4 lb

## 2022-09-07 DIAGNOSIS — I428 Other cardiomyopathies: Secondary | ICD-10-CM | POA: Diagnosis not present

## 2022-09-07 DIAGNOSIS — I442 Atrioventricular block, complete: Secondary | ICD-10-CM | POA: Diagnosis not present

## 2022-09-07 DIAGNOSIS — Z952 Presence of prosthetic heart valve: Secondary | ICD-10-CM | POA: Diagnosis not present

## 2022-09-07 DIAGNOSIS — Z79899 Other long term (current) drug therapy: Secondary | ICD-10-CM | POA: Diagnosis not present

## 2022-09-07 DIAGNOSIS — I2510 Atherosclerotic heart disease of native coronary artery without angina pectoris: Secondary | ICD-10-CM | POA: Insufficient documentation

## 2022-09-07 DIAGNOSIS — Z7901 Long term (current) use of anticoagulants: Secondary | ICD-10-CM | POA: Diagnosis not present

## 2022-09-07 DIAGNOSIS — I272 Pulmonary hypertension, unspecified: Secondary | ICD-10-CM | POA: Insufficient documentation

## 2022-09-07 DIAGNOSIS — I5022 Chronic systolic (congestive) heart failure: Secondary | ICD-10-CM | POA: Diagnosis not present

## 2022-09-07 DIAGNOSIS — I4821 Permanent atrial fibrillation: Secondary | ICD-10-CM | POA: Diagnosis not present

## 2022-09-07 DIAGNOSIS — I519 Heart disease, unspecified: Secondary | ICD-10-CM

## 2022-09-07 DIAGNOSIS — E119 Type 2 diabetes mellitus without complications: Secondary | ICD-10-CM | POA: Insufficient documentation

## 2022-09-07 DIAGNOSIS — Z950 Presence of cardiac pacemaker: Secondary | ICD-10-CM | POA: Insufficient documentation

## 2022-09-07 LAB — BASIC METABOLIC PANEL
Anion gap: 9 (ref 5–15)
BUN: 13 mg/dL (ref 8–23)
CO2: 23 mmol/L (ref 22–32)
Calcium: 8.5 mg/dL — ABNORMAL LOW (ref 8.9–10.3)
Chloride: 107 mmol/L (ref 98–111)
Creatinine, Ser: 0.52 mg/dL (ref 0.44–1.00)
GFR, Estimated: >60 mL/min (ref >60)
Glucose, Bld: 129 mg/dL — ABNORMAL HIGH (ref 70–99)
Potassium: 3.7 mmol/L (ref 3.5–5.1)
Sodium: 139 mmol/L (ref 135–145)

## 2022-09-07 LAB — LIPID PANEL
Cholesterol: 137 mg/dL (ref 0–200)
HDL: 60 mg/dL (ref >40)
LDL Cholesterol: 63 mg/dL (ref 0–99)
Total CHOL/HDL Ratio: 2.3 RATIO
Triglycerides: 68 mg/dL (ref <150)
VLDL: 14 mg/dL (ref 0–40)

## 2022-09-07 LAB — CBC
HCT: 44.6 % (ref 36.0–46.0)
Hemoglobin: 14 g/dL (ref 12.0–15.0)
MCH: 29.9 pg (ref 26.0–34.0)
MCHC: 31.4 g/dL (ref 30.0–36.0)
MCV: 95.1 fL (ref 80.0–100.0)
Platelets: 190 10*3/uL (ref 150–400)
RBC: 4.69 MIL/uL (ref 3.87–5.11)
RDW: 13.6 % (ref 11.5–15.5)
WBC: 4.7 10*3/uL (ref 4.0–10.5)
nRBC: 0 % (ref 0.0–0.2)

## 2022-09-07 NOTE — Patient Instructions (Signed)
There has been no changes to your medications.  Labs done today, your results will be available in MyChart, we will contact you for abnormal readings.  Your physician has requested that you have an echocardiogram. Echocardiography is a painless test that uses sound waves to create images of your heart. It provides your doctor with information about the size and shape of your heart and how well your heart's chambers and valves are working. This procedure takes approximately one hour. There are no restrictions for this procedure. Please do NOT wear cologne, perfume, aftershave, or lotions (deodorant is allowed). Please arrive 15 minutes prior to your appointment time.  Your physician recommends that you schedule a follow-up appointment in: 4 months with an echocardiogram ( May 2024)  ** please call the office in February to arrange your follow up   If you have any questions or concerns before your next appointment please send [name] a message through [place] or call our office at [phone].    TO LEAVE A MESSAGE FOR THE NURSE SELECT OPTION 2, PLEASE LEAVE A MESSAGE INCLUDING: YOUR NAME DATE OF BIRTH CALL BACK NUMBER REASON FOR CALL**this is important as we prioritize the call backs  YOU WILL RECEIVE A CALL BACK THE SAME DAY AS LONG AS YOU CALL BEFORE 4:00 PM  At the [facility], you and your health needs are our priority. As part of our continuing mission to provide you with exceptional heart care, we have created designated Provider Care Teams. These Care Teams include your primary Cardiologist (physician) and Advanced Practice Providers (APPs- Physician Assistants and Nurse Practitioners) who all work together to provide you with the care you need, when you need it.   You may see any of the following providers on your designated Care Team at your next follow up: Dr Glori Bickers Dr Loralie Champagne Dr. Roxana Hires, NP Lyda Jester, [facility] [place], [place] Forestine Na, NP Audry Riles, PharmD   Please be sure to bring in all your medications bottles to every appointment.

## 2022-09-07 NOTE — Progress Notes (Signed)
Advanced Heart Failure Clinic Note    Primary Care: Dr. Billey Gosling HF Cardiologist: Dr. Aundra Dubin EP: Dr. Rayann Heman   HPI: Anita Duke is a 82 y.o. with a past medical history of pulmonary HTN, mitral regurgitation s/p mitral valve repair, MAZE procedure at the [facility] in 2001, at that time she also had a LIMA to LAD graft placed. Also with history of permanent Afib on warfarin s/p AVN ablation. At one time she was on Tikosyn which was stopped for QT prolongation.  She has chronic systolic CHF (EF 92% in 12/2681).  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 Last cath in 2014 with atretic LIMA to LAD but nonobstructive disease in LAD.   Echo in 10/18 showed EF improved to 55-60% with mild to moderate RV dysfunction.   Echo in 7/20 showed EF 50% with basal inferior and inferolateral akinesis, mildly decreased RV systolic function, s/p MV repair with trivial MR, no MS, normal IVC.  Echo in 1/22 showed EF 45-50% with basal inferior and basal inferolateral akinesis, mildly decreased RV systolic function, s/p MV repair with trivial MR and mean gradient 4 mmHg.   Patient developed breast cancer in 2022 and is status post lumpectomy.  Echo 12/22 showed EF 45-50%, diffuse hypokinesis, mildly decreased RV function, repaired mitral valve with trivial MR no MS, normal IVC.   Today she returns for HF follow up. She walks her dog for exercise.  No dyspnea with her usual activities.  She has mild dyspnea walking up stairs.  No chest pain.  No lightheadedness.  Rare palpitations.   ECG (personally reviewed): atrial fibrillation, BiV pacing  Labs (5/18): K 4.7, creatinine 0.62, LDL 71, HDL 61 Labs (10/18): K 3.5, creatinine 0.59, TSH normal Labs (7/20): K 3.9, creatinine 0.69, hgb 15.1, LDL 37, TSH normal Labs (7/21): K 3.8, creatinine 0.64, LDL 52, HDL 64 Labs (1/22): K 4.1, creatinine 0.68, LDL 40 Labs (7/22): LDL 47 Labs (10/22): K 3.6, creatinine 0.71 Labs (1/23): LDL 60, HDL 59 Labs  (4/23): K 4.0, creatinine 0.61, hgb 13.2 Labs (11/23): K 4.3, creatinine 0.64  ROS: All systems reviewed and negative except as per HPI.  PMH: 1. Type II diabetes. 2. COPD 3. Depression 4. H/o colon cancer 5. CAD: LIMA-LAD in 4/01 with MV repair.   - LHC (8/14): LIMA-LAD atretic, 50-60% mid LAD stenosis.  6. Mitral regurgitation: S/p MV repair at the [facility] in 4/01.  She also had LAA ligation and MAZE. Echo in 7/20 with trivial MR and no MS.  7. 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   - St Jude CRT-P device.  - Echo (4/16): EF 40%, mild LV dilation, normal RV size with mild to moderately decreased systolic function, s/p mitral valve repair with mild MR, no significant stenosis.  - Echo (10/18): EF 55-60%, severe LAE, RV with mild to moderate systolic dysfunction, mild-moderate TR, PASP 35 mmHg.  - Echo (7/20): EF 50% with basal inferior and inferolateral akinesis, mildly decreased RV systolic function, s/p MV repair with trivial MR, no MS, normal IVC.  - Echo (1/22): EF 45-50% with basal inferior and basal inferolateral akinesis, mildly decreased RV systolic function, s/p MV repair with trivial MR and mean gradient 4 mmHg. - Echo (12/22): EF 45-50%, diffuse hypokinesis, mildly decreased RV function, repaired mitral valve with trivial MR no MS, normal IVC.  8. Permanent atrial fibrillation: S/p MAZE in 4/01.  S/p AV nodal ablation in 9/13.   9. PFTs (1/19): No significant obstruction  or restriction in the lungs.  10. Breast cancer: S/p lumpectomy in 2022.   ROS: All systems reviewed and negative except as per HPI.   Current Outpatient Medications  Medication Sig Dispense Refill   ACCU-CHEK SOFTCLIX LANCETS lancets USE TO CHECK BLOOD SUGARS  TWICE A DAY 200 each 2   amoxicillin (AMOXIL) 500 MG capsule TAKE 4 CAPSULES PRIOR TO DENTAL APPOINTMENTS AS DIRECTED 12 capsule 0   atorvastatin (LIPITOR) 40 MG tablet Take 1 tablet (40 mg total) by mouth daily. 90 tablet 3    carvedilol (COREG) 12.5 MG tablet Take 1 tablet (12.5 mg total) by mouth 2 (two) times daily with a meal. 180 tablet 3   cholecalciferol (VITAMIN D) 1000 UNITS tablet Take 1,000 Units by mouth daily.      cyanocobalamin (VITAMIN B12) 500 MCG tablet Take 500 mcg by mouth daily.     empagliflozin (JARDIANCE) 25 MG TABS tablet Take 1 tablet (25 mg total) by mouth daily. 30 tablet 3   fish oil-omega-3 fatty acids 1000 MG capsule Take 1 g by mouth 2 (two) times daily.     loratadine (CLARITIN) 10 MG tablet Take 10 mg by mouth daily.     Polyethyl Glycol-Propyl Glycol (SYSTANE) 0.4-0.3 % SOLN Place 1 drop into both eyes daily.     sacubitril-valsartan (ENTRESTO) 24-26 MG Take 1 tablet by mouth 2 (two) times daily. 60 tablet 6   sodium chloride (OCEAN) 0.65 % SOLN nasal spray Place 1 spray into both nostrils 2 (two) times daily.     spironolactone (ALDACTONE) 25 MG tablet Take 1 tablet (25 mg total) by mouth daily. 90 tablet 3   tamoxifen (NOLVADEX) 20 MG tablet Take 1 tablet (20 mg total) by mouth daily. 90 tablet 2   venlafaxine XR (EFFEXOR-XR) 37.5 MG 24 hr capsule Take 1 capsule (37.5 mg total) by mouth daily. 90 capsule 2   warfarin (COUMADIN) 2.5 MG tablet TAKE 1 TABLET BY MOUTH DAILY OR AS DIRECTED BY ANTICOAGULATION  CLINIC (Patient taking differently: Take 2.5 mg by mouth every evening.  OR AS DIRECTED BY ANTICOAGULATION  CLINIC) 104 tablet 3   No current facility-administered medications for this encounter.   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: [employer]   Tobacco Use   Smoking status: Never   Smokeless tobacco: Never  Vaping Use   Vaping Use: Never used  Substance and Sexual Activity   Alcohol use: No   Drug use: No   Sexual activity: Not Currently    Birth control/protection: Surgical  Other Topics Concern   Not on file  Social History Narrative   No caffeine   Right handed    Lives alone    Social Determinants of Health   Financial Resource Strain: Low Risk  (06/26/2022)   Overall Financial Resource Strain (CARDIA)    Difficulty  of Paying Living Expenses: Not hard at all  Food Insecurity: No Food Insecurity (06/26/2022)   Hunger Vital Sign    Worried About Running Out of Food in the Last Year: Never true    Ran Out of Food in the Last Year: Never true  Transportation Needs: No Transportation Needs (06/26/2022)   PRAPARE - Hydrologist (Medical): No    Lack of Transportation (Non-Medical): No  Physical Activity: Sufficiently Active (06/26/2022)   Exercise Vital Sign    Days of Exercise per Week: 7 days    Minutes of Exercise per Session: 30 min  Stress: No Stress Concern Present (06/26/2022)   [place]    Feeling of Stress : Not at all  Social Connections: [place] (06/26/2022)   Social Connection and Isolation Panel [NHANES]    Frequency of Communication with Friends and Family: More than three times a week    Frequency of Social Gatherings with Friends and Family: More than three times a week    Attends Religious Services: More than 4 times per year    Active Member of [employer] or Organizations: Yes    Attends Music therapist: More than 4 times per year    Marital Status: Married  Human resources officer Violence: Not At Risk (06/26/2022)   Humiliation,  Afraid, Rape, and Kick questionnaire    Fear of Current or Ex-Partner: No    Emotionally Abused: No    Physically Abused: No    Sexually Abused: No   Family History  Problem Relation Age of Onset   Diabetes Mother    Kidney disease Mother    Thyroid disease Mother    Alzheimer's disease Mother        Symptom onset in late 35s; confirmed via autopsy   Depression Mother    Heart disease Father    Diabetes Father    Heart disease Sister        x 2   Colon cancer Maternal Aunt        dx 90s; mother's paternal half sister   Colon cancer Maternal Aunt        dx late 36s   Heart disease Paternal Uncle        x 6   Cancer Maternal Grandfather        unknown type; dx unknown age   Diabetes Paternal Grandmother    Diabetes Paternal Grandfather    Colon cancer Cousin    Brain cancer Nephew 55   Colon polyps Neg Hx    Adrenal disorder Neg Hx    Esophageal cancer Neg Hx    Rectal cancer Neg Hx    Stomach cancer Neg Hx    BP (!) 140/70   Pulse 70   Wt 65.5 kg (144 lb 6.4 oz)   SpO2 97%   BMI 26.41 kg/m   Wt Readings from Last 3 Encounters:  09/07/22 65.5 kg (144 lb 6.4 oz)  07/13/22 65 kg (143 lb 3.2 oz)  07/04/22 65.8 kg (145 lb)   PHYSICAL EXAM: General: NAD Neck: No JVD, no thyromegaly or thyroid nodule.  Lungs: Clear to auscultation bilaterally with normal respiratory effort. CV: Nondisplaced PMI.  Heart regular S1/S2, no S3/S4, no murmur.  No peripheral edema.  No carotid bruit.  Normal pedal pulses.  Abdomen: Soft, nontender, no hepatosplenomegaly, no distention.  Skin: Intact without lesions or rashes.  Neurologic: Alert and oriented x 3.  Psych: Normal  affect. Extremities: No clubbing or cyanosis.  HEENT: Normal.   ASSESSMENT & PLAN: 1. Chronic systolic CHF: EF 76% in 5465. NICM most likely.  She has LIMA-LAD that is atretic, but no flow limiting disease in the LAD.  Cardiomyopathy may be due to RV pacing after AV nodal ablation in 9/13. She now has St Jude  CRT-P device and EF was up to 55-60% on 10/18 echo. Echo in 7/20 showed EF about 50%.  Echo in 1/22 and in 12/22 showed stable EF 45-50%.  NYHA class I-II symptoms.  She is not volume overloaded on exam.  - Continue Coreg 12.5 mg bid.  - Continue Jardiance.  - Continue Entresto 24/26 bid. BMET today.  - She is doing fine off Lasix, not volume overloaded.   - Continue spironolactone 25 mg daily.   - Repeat echo at followup appt.  2. CAD: LIMA-LAD at time of MV surgery in 4/01, but LIMA-LAD atretic on 8/14 cath.  However, at that time her LAD had moderate stenosis that did not appear flow-limiting (50-60%).  No chest pain.  - Continue atorvastatin 40 mg daily. Check lipids today.   - No ASA given stable CAD with use of warfarin 3. Complete heart block: She had an AV nodal ablation.  Now s/p CRT-P.   4. Permanent atrial fibrillation: Now s/p AV nodal ablation and BiV pacing. - Continue warfarin for anticoagulation. CBC today.  - She could swich anticoagulation to Eliquis, data suggests ok to use DOAC with valve repair, bioprosthetic valve.  However, she wants to continue warfarin as she has been on it stably x years.  5. DM: Continue Jardiance.  6. S/p MV repair: Stable mitral valve repair on 12/22 echo.   - Echo at next appt.   Follow up in 4 months with echo  Loralie Champagne, MD 09/07/22

## 2022-09-12 ENCOUNTER — Ambulatory Visit (INDEPENDENT_AMBULATORY_CARE_PROVIDER_SITE_OTHER): Payer: Medicare HMO

## 2022-09-12 DIAGNOSIS — Z7901 Long term (current) use of anticoagulants: Secondary | ICD-10-CM

## 2022-09-12 LAB — POCT INR: INR: 3.8 — AB (ref 2.0–3.0)

## 2022-09-12 NOTE — Progress Notes (Signed)
Pt started Effexor about 1 month ago. This interacts with warfarin and increases effects of warfarin.  Hold dose today and then change weekly dose to take 1 tablet daily except take 1 1/2 tablets on Fridays and recheck in 2 weeks.

## 2022-09-12 NOTE — Patient Instructions (Addendum)
Pre visit review using our clinic review tool, if applicable. No additional management support is needed unless otherwise documented below in the visit note.  Hold dose today and then change weekly dose to take 1 tablet daily except take 1 1/2 tablets on Fridays and recheck in 2 weeks.

## 2022-09-21 ENCOUNTER — Encounter (HOSPITAL_COMMUNITY): Payer: Self-pay

## 2022-09-22 ENCOUNTER — Ambulatory Visit: Payer: Medicare HMO | Attending: Internal Medicine

## 2022-09-22 DIAGNOSIS — I495 Sick sinus syndrome: Secondary | ICD-10-CM | POA: Diagnosis not present

## 2022-09-25 LAB — CUP PACEART REMOTE DEVICE CHECK
Battery Remaining Longevity: 91 mo
Battery Remaining Percentage: 95.5 %
Battery Voltage: 3.02 V
Date Time Interrogation Session: 20240119020025
Implantable Lead Connection Status: 753985
Implantable Lead Connection Status: 753985
Implantable Lead Connection Status: 753985
Implantable Lead Implant Date: 20130913
Implantable Lead Implant Date: 20130913
Implantable Lead Implant Date: 20150303
Implantable Lead Location: 753858
Implantable Lead Location: 753859
Implantable Lead Location: 753860
Implantable Lead Model: 5076
Implantable Lead Model: 5092
Implantable Pulse Generator Implant Date: 20231019
Lead Channel Impedance Value: 400 Ohm
Lead Channel Impedance Value: 460 Ohm
Lead Channel Pacing Threshold Amplitude: 1.125 V
Lead Channel Pacing Threshold Amplitude: 1.375 V
Lead Channel Pacing Threshold Pulse Width: 0.5 ms
Lead Channel Pacing Threshold Pulse Width: 0.5 ms
Lead Channel Sensing Intrinsic Amplitude: 8.4 mV
Lead Channel Setting Pacing Amplitude: 2.125
Lead Channel Setting Pacing Amplitude: 2.375
Lead Channel Setting Pacing Pulse Width: 0.5 ms
Lead Channel Setting Pacing Pulse Width: 0.5 ms
Lead Channel Setting Sensing Sensitivity: 2 mV
Pulse Gen Model: 3562
Pulse Gen Serial Number: 8122237

## 2022-09-26 ENCOUNTER — Ambulatory Visit (INDEPENDENT_AMBULATORY_CARE_PROVIDER_SITE_OTHER): Payer: Medicare HMO

## 2022-09-26 DIAGNOSIS — Z7901 Long term (current) use of anticoagulants: Secondary | ICD-10-CM

## 2022-09-26 LAB — POCT INR: INR: 2 (ref 2.0–3.0)

## 2022-09-26 NOTE — Patient Instructions (Addendum)
Pre visit review using our clinic review tool, if applicable. No additional management support is needed unless otherwise documented below in the visit note.  Continue 1 tablet daily except take 1 1/2 tablets on Fridays and recheck in 4 weeks.  

## 2022-09-26 NOTE — Progress Notes (Signed)
Continue 1 tablet daily except take 1 1/2 tablets on Fridays and recheck in 4 weeks.  

## 2022-09-30 NOTE — Progress Notes (Signed)
Agree with management.  Shenea Giacobbe J Veona Bittman, MD  

## 2022-10-01 ENCOUNTER — Encounter: Payer: Self-pay | Admitting: Internal Medicine

## 2022-10-01 NOTE — Progress Notes (Unsigned)
Subjective:    Patient ID: Anita Duke, female    DOB: [birth year], 82 y.o.   MRN: [id number]      HPI Anita Duke is here for a Physical exam.   Runny nose - wonders what she can take  "Choking" - for years has been choking.  Can be eating or not eating.  Had several tests - told she had a sensitive gag reflex.  She was advised to go to [facility].  She did not end up going to [facility] and her symptoms improved.  They were gone for a while but she is started to notice symptoms again.  If she takes in a deep breath in through her mouth it closes up her throat and it takes a while for it to relax.  If she is eating certain things and it is just at the right place in the back of her throat it causes her throat to close up as well and she is not able to breath.  If she tries to breath she makes a lot of noise.  No gerd.      Medications and allergies reviewed with patient and updated if appropriate.  Current Outpatient Medications on File Prior to Visit  Medication Sig Dispense Refill   ACCU-CHEK SOFTCLIX LANCETS lancets USE TO CHECK BLOOD SUGARS  TWICE A DAY 200 each 2   amoxicillin (AMOXIL) 500 MG capsule TAKE 4 CAPSULES PRIOR TO DENTAL APPOINTMENTS AS DIRECTED 12 capsule 0   atorvastatin (LIPITOR) 40 MG tablet Take 1 tablet (40 mg total) by mouth daily. 90 tablet 3   carvedilol (COREG) 12.5 MG tablet Take 1 tablet (12.5 mg total) by mouth 2 (two) times daily with a meal. 180 tablet 3   cholecalciferol (VITAMIN D) 1000 UNITS tablet Take 1,000 Units by mouth daily.      cyanocobalamin (VITAMIN B12) 500 MCG tablet Take 500 mcg by mouth daily.     empagliflozin (JARDIANCE) 25 MG TABS tablet Take 1 tablet (25 mg total) by mouth daily. 30 tablet 3   fish oil-omega-3 fatty acids 1000 MG capsule Take 1 g by mouth 2 (two) times daily.     loratadine (CLARITIN) 10 MG tablet Take 10 mg by mouth daily.     Polyethyl Glycol-Propyl Glycol (SYSTANE) 0.4-0.3 % SOLN Place 1 drop into both eyes daily.      sacubitril-valsartan (ENTRESTO) 24-26 MG Take 1 tablet by mouth 2 (two) times daily. 60 tablet 6   sodium chloride (OCEAN) 0.65 % SOLN nasal spray Place 1 spray into both nostrils 2 (two) times daily.     spironolactone (ALDACTONE) 25 MG tablet Take 1 tablet (25 mg total) by mouth daily. 90 tablet 3   tamoxifen (NOLVADEX) 20 MG tablet Take 1 tablet (20 mg total) by mouth daily. 90 tablet 2   venlafaxine XR (EFFEXOR-XR) 37.5 MG 24 hr capsule Take 1 capsule (37.5 mg total) by mouth daily. 90 capsule 2   warfarin (COUMADIN) 2.5 MG tablet TAKE 1 TABLET BY MOUTH DAILY OR AS DIRECTED BY ANTICOAGULATION  CLINIC (Patient taking differently: Take 2.5 mg by mouth every evening.  OR AS DIRECTED BY ANTICOAGULATION  CLINIC) 104 tablet 3   No current facility-administered medications on file prior to visit.    Review of Systems  Constitutional:  Negative for fever.  HENT:  Positive for rhinorrhea. Negative for trouble swallowing and voice change.   Eyes:  Negative for visual disturbance.  Respiratory:  Negative for cough, shortness of breath and wheezing.  Cardiovascular:  Negative for chest pain, palpitations and leg swelling.  Gastrointestinal:  Negative for abdominal pain, blood in stool, constipation and diarrhea.       No gerd  Genitourinary:  Negative for dysuria.  Musculoskeletal:  Negative for arthralgias and back pain.  Skin:  Negative for rash.  Neurological:  Negative for light-headedness and headaches.  Psychiatric/Behavioral:  Negative for dysphoric mood. The patient is not nervous/anxious.        Objective:   Vitals:   10/02/22 0854  BP: 130/68  Pulse: 82  Temp: 97.9 F (36.6 C)  SpO2: 97%   Filed Weights   10/02/22 0854  Weight: 143 lb (64.9 kg)   Body mass index is 26.16 kg/m.  BP Readings from Last 3 Encounters:  10/02/22 130/68  09/07/22 (!) 140/70  07/13/22 (!) 142/52    Wt Readings from Last 3 Encounters:  10/02/22 143 lb (64.9 kg)  09/07/22 144 lb 6.4 oz  (65.5 kg)  07/13/22 143 lb 3.2 oz (65 kg)       Physical Exam Constitutional: She appears well-developed and well-nourished. No distress.  HENT:  Head: Normocephalic and atraumatic.  Right Ear: External ear normal. Normal ear canal and TM Left Ear: External ear normal.  Normal ear canal and TM Mouth/Throat: Oropharynx is clear and moist.  Eyes: Conjunctivae normal.  Neck: Neck supple. No tracheal deviation present. No thyromegaly present.  No carotid bruit  Cardiovascular: Normal rate, regular rhythm and normal heart sounds.   No murmur heard.  No edema. Pulmonary/Chest: Effort normal and breath sounds normal. No respiratory distress. She has no wheezes. She has no rales.  Breast: deferred   Abdominal: Soft. She exhibits no distension. There is no tenderness.  Lymphadenopathy: She has no cervical adenopathy.  Skin: Skin is warm and dry. She is not diaphoretic.  Psychiatric: She has a normal mood and affect. Her behavior is normal.     Lab Results  Component Value Date   WBC 7.5 10/02/2022   HGB 14.7 10/02/2022   HCT 44.1 10/02/2022   PLT 194.0 10/02/2022   GLUCOSE 143 (H) 10/02/2022   CHOL 127 10/02/2022   TRIG 106.0 10/02/2022   HDL 61.10 10/02/2022   LDLDIRECT 65.0 01/05/2016   LDLCALC 45 10/02/2022   ALT 15 10/02/2022   AST 18 10/02/2022   NA 142 10/02/2022   K 3.2 (L) 10/02/2022   CL 105 10/02/2022   CREATININE 0.62 10/02/2022   BUN 15 10/02/2022   CO2 27 10/02/2022   TSH 0.99 10/02/2022   INR 2.0 09/26/2022   HGBA1C 7.6 (H) 10/02/2022   MICROALBUR <0.7 10/02/2022    Diabetic Foot Exam - Simple   Simple Foot Form Diabetic Foot exam was performed with the following findings: Yes 10/02/2022 12:09 PM  Visual Inspection No deformities, no ulcerations, no other skin breakdown bilaterally: Yes Sensation Testing Intact to touch and monofilament testing bilaterally: Yes Pulse Check Posterior Tibialis and Dorsalis pulse intact bilaterally: Yes Comments B/l  bunions medial and lateral         Assessment & Plan:   Physical exam: Screening blood work  ordered Exercise  walking dog Weight  good  Substance abuse  none   Reviewed recommended immunizations.   Health Maintenance  Topic Date Due   DTaP/Tdap/Td (1 - Tdap) Never done   COVID-19 Vaccine (4 - 2023-24 season) 05/05/2022   COLONOSCOPY (Pts 45-36yr Insurance coverage will need to be confirmed)  02/25/2023   DEXA SCAN  04/01/2023   HEMOGLOBIN  A1C  04/02/2023   Medicare Annual Wellness (AWV)  06/27/2023   OPHTHALMOLOGY EXAM  07/20/2023   Diabetic kidney evaluation - eGFR measurement  10/03/2023   Diabetic kidney evaluation - Urine ACR  10/03/2023   FOOT EXAM  10/03/2023   Pneumonia Vaccine 3+ Years old  Completed   INFLUENZA VACCINE  Completed   Zoster Vaccines- Shingrix  Completed   HPV VACCINES  Aged Out          See Problem List for Assessment and Plan of chronic medical problems.

## 2022-10-01 NOTE — Patient Instructions (Addendum)
Blood work was ordered.   The lab is on the first floor.    Medications changes include :   none    Return in about 6 months (around 04/02/2023) for follow up, Schedule DEXA-[facility] for August.    Health Maintenance, Female Adopting a healthy lifestyle and getting preventive care are important in promoting health and wellness. Ask your health care provider about: The right schedule for you to have regular tests and exams. Things you can do on your own to prevent diseases and keep yourself healthy. What should I know about diet, weight, and exercise? Eat a healthy diet  Eat a diet that includes plenty of vegetables, fruits, low-fat dairy products, and lean protein. Do not eat a lot of foods that are high in solid fats, added sugars, or sodium. Maintain a healthy weight Body mass index (BMI) is used to identify weight problems. It estimates body fat based on height and weight. Your health care provider can help determine your BMI and help you achieve or maintain a healthy weight. Get regular exercise Get regular exercise. This is one of the most important things you can do for your health. Most adults should: Exercise for at least 150 minutes each week. The exercise should increase your heart rate and make you sweat (moderate-intensity exercise). Do strengthening exercises at least twice a week. This is in addition to the moderate-intensity exercise. Spend less time sitting. Even light physical activity can be beneficial. Watch cholesterol and blood lipids Have your blood tested for lipids and cholesterol at 83 years of age, then have this test every 5 years. Have your cholesterol levels checked more often if: Your lipid or cholesterol levels are high. You are older than 83 years of age. You are at high risk for heart disease. What should I know about cancer screening? Depending on your health history and family history, you may need to have cancer screening at various ages.  This may include screening for: Breast cancer. Cervical cancer. Colorectal cancer. Skin cancer. Lung cancer. What should I know about heart disease, diabetes, and high blood pressure? Blood pressure and heart disease High blood pressure causes heart disease and increases the risk of stroke. This is more likely to develop in people who have high blood pressure readings or are overweight. Have your blood pressure checked: Every 3-5 years if you are 109-76 years of age. Every year if you are 101 years old or older. Diabetes Have regular diabetes screenings. This checks your fasting blood sugar level. Have the screening done: Once every three years after age 90 if you are at a normal weight and have a low risk for diabetes. More often and at a younger age if you are overweight or have a high risk for diabetes. What should I know about preventing infection? Hepatitis B If you have a higher risk for hepatitis B, you should be screened for this virus. Talk with your health care provider to find out if you are at risk for hepatitis B infection. Hepatitis C Testing is recommended for: Everyone born from [birth year] through 1966. Anyone with known risk factors for hepatitis C. Sexually transmitted infections (STIs) Get screened for STIs, including gonorrhea and chlamydia, if: You are sexually active and are younger than 83 years of age. You are older than 83 years of age and your health care provider tells you that you are at risk for this type of infection. Your sexual activity has changed since you were last screened,  and you are at increased risk for chlamydia or gonorrhea. Ask your health care provider if you are at risk. Ask your health care provider about whether you are at high risk for HIV. Your health care provider may recommend a prescription medicine to help prevent HIV infection. If you choose to take medicine to prevent HIV, you should first get tested for HIV. You should then be tested every 3  months for as long as you are taking the medicine. Pregnancy If you are about to stop having your period (premenopausal) and you may become pregnant, seek counseling before you get pregnant. Take 400 to 800 micrograms (mcg) of folic acid every day if you become pregnant. Ask for birth control (contraception) if you want to prevent pregnancy. Osteoporosis and menopause Osteoporosis is a disease in which the bones lose minerals and strength with aging. This can result in bone fractures. If you are 50 years old or older, or if you are at risk for osteoporosis and fractures, ask your health care provider if you should: Be screened for bone loss. Take a calcium or vitamin D supplement to lower your risk of fractures. Be given hormone replacement therapy (HRT) to treat symptoms of menopause. Follow these instructions at home: Alcohol use Do not drink alcohol if: Your health care provider tells you not to drink. You are pregnant, may be pregnant, or are planning to become pregnant. If you drink alcohol: Limit how much you have to: 0-1 drink a day. Know how much alcohol is in your drink. In the [place] one drink equals one 12 oz bottle of beer (355 mL), one 5 oz glass of wine (148 mL), or one 1 oz glass of hard liquor (44 mL). Lifestyle Do not use any products that contain nicotine or tobacco. These products include cigarettes, chewing tobacco, and vaping devices, such as e-cigarettes. If you need help quitting, ask your health care provider. Do not use street drugs. Do not share needles. Ask your health care provider for help if you need support or information about quitting drugs. General instructions Schedule regular health, dental, and eye exams. Stay current with your vaccines. Tell your health care provider if: You often feel depressed. You have ever been abused or do not feel safe at home. Summary Adopting a healthy lifestyle and getting preventive care are important in promoting health  and wellness. Follow your health care provider's instructions about healthy diet, exercising, and getting tested or screened for diseases. Follow your health care provider's instructions on monitoring your cholesterol and blood pressure. This information is not intended to replace advice given to you by your health care provider. Make sure you discuss any questions you have with your health care provider. Document Revised: 01/10/2021 Document Reviewed: 01/10/2021 Elsevier Patient Education  [place].

## 2022-10-02 ENCOUNTER — Other Ambulatory Visit: Payer: Medicare HMO

## 2022-10-02 ENCOUNTER — Ambulatory Visit (INDEPENDENT_AMBULATORY_CARE_PROVIDER_SITE_OTHER): Payer: Medicare HMO | Admitting: Internal Medicine

## 2022-10-02 ENCOUNTER — Ambulatory Visit: Payer: Medicare HMO | Attending: Cardiology | Admitting: Cardiology

## 2022-10-02 ENCOUNTER — Encounter: Payer: Self-pay | Admitting: Cardiology

## 2022-10-02 ENCOUNTER — Other Ambulatory Visit (INDEPENDENT_AMBULATORY_CARE_PROVIDER_SITE_OTHER): Payer: Medicare HMO

## 2022-10-02 ENCOUNTER — Other Ambulatory Visit: Payer: Self-pay

## 2022-10-02 ENCOUNTER — Telehealth: Payer: Self-pay

## 2022-10-02 VITALS — BP 132/68 | HR 72 | Ht 60.0 in | Wt 142.0 lb

## 2022-10-02 VITALS — BP 130/68 | HR 82 | Temp 97.9°F | Ht 62.0 in | Wt 143.0 lb

## 2022-10-02 DIAGNOSIS — I10 Essential (primary) hypertension: Secondary | ICD-10-CM | POA: Diagnosis not present

## 2022-10-02 DIAGNOSIS — J385 Laryngeal spasm: Secondary | ICD-10-CM | POA: Insufficient documentation

## 2022-10-02 DIAGNOSIS — R300 Dysuria: Secondary | ICD-10-CM

## 2022-10-02 DIAGNOSIS — I4891 Unspecified atrial fibrillation: Secondary | ICD-10-CM

## 2022-10-02 DIAGNOSIS — E782 Mixed hyperlipidemia: Secondary | ICD-10-CM

## 2022-10-02 DIAGNOSIS — I2510 Atherosclerotic heart disease of native coronary artery without angina pectoris: Secondary | ICD-10-CM

## 2022-10-02 DIAGNOSIS — M85852 Other specified disorders of bone density and structure, left thigh: Secondary | ICD-10-CM | POA: Diagnosis not present

## 2022-10-02 DIAGNOSIS — E1142 Type 2 diabetes mellitus with diabetic polyneuropathy: Secondary | ICD-10-CM | POA: Diagnosis not present

## 2022-10-02 DIAGNOSIS — F3289 Other specified depressive episodes: Secondary | ICD-10-CM

## 2022-10-02 DIAGNOSIS — I4821 Permanent atrial fibrillation: Secondary | ICD-10-CM

## 2022-10-02 DIAGNOSIS — D6869 Other thrombophilia: Secondary | ICD-10-CM

## 2022-10-02 DIAGNOSIS — I519 Heart disease, unspecified: Secondary | ICD-10-CM

## 2022-10-02 DIAGNOSIS — E538 Deficiency of other specified B group vitamins: Secondary | ICD-10-CM

## 2022-10-02 DIAGNOSIS — E559 Vitamin D deficiency, unspecified: Secondary | ICD-10-CM

## 2022-10-02 DIAGNOSIS — J3489 Other specified disorders of nose and nasal sinuses: Secondary | ICD-10-CM

## 2022-10-02 DIAGNOSIS — Z0000 Encounter for general adult medical examination without abnormal findings: Secondary | ICD-10-CM | POA: Diagnosis not present

## 2022-10-02 DIAGNOSIS — R69 Illness, unspecified: Secondary | ICD-10-CM | POA: Diagnosis not present

## 2022-10-02 DIAGNOSIS — I5022 Chronic systolic (congestive) heart failure: Secondary | ICD-10-CM | POA: Diagnosis not present

## 2022-10-02 DIAGNOSIS — E1159 Type 2 diabetes mellitus with other circulatory complications: Secondary | ICD-10-CM

## 2022-10-02 LAB — MICROALBUMIN / CREATININE URINE RATIO
Creatinine,U: 65.6 mg/dL
Microalb Creat Ratio: 1.1 mg/g (ref 0.0–30.0)
Microalb, Ur: <0.7 mg/dL (ref 0.0–1.9)

## 2022-10-02 LAB — TSH: TSH: 0.99 u[IU]/mL (ref 0.35–5.50)

## 2022-10-02 LAB — COMPREHENSIVE METABOLIC PANEL
ALT: 15 U/L (ref 0–35)
AST: 18 U/L (ref 0–37)
Albumin: 4.3 g/dL (ref 3.5–5.2)
Alkaline Phosphatase: 55 U/L (ref 39–117)
BUN: 15 mg/dL (ref 6–23)
CO2: 27 mEq/L (ref 19–32)
Calcium: 9.1 mg/dL (ref 8.4–10.5)
Chloride: 105 mEq/L (ref 96–112)
Creatinine, Ser: 0.62 mg/dL (ref 0.40–1.20)
GFR: 82.76 mL/min (ref >60.00)
Glucose, Bld: 143 mg/dL — ABNORMAL HIGH (ref 70–99)
Potassium: 3.2 mEq/L — ABNORMAL LOW (ref 3.5–5.1)
Sodium: 142 mEq/L (ref 135–145)
Total Bilirubin: 1 mg/dL (ref 0.2–1.2)
Total Protein: 6.9 g/dL (ref 6.0–8.3)

## 2022-10-02 LAB — CBC WITH DIFFERENTIAL/PLATELET
Basophils Absolute: 0.1 10*3/uL (ref 0.0–0.1)
Basophils Relative: 0.8 % (ref 0.0–3.0)
Eosinophils Absolute: 0.1 10*3/uL (ref 0.0–0.7)
Eosinophils Relative: 1.3 % (ref 0.0–5.0)
HCT: 44.1 % (ref 36.0–46.0)
Hemoglobin: 14.7 g/dL (ref 12.0–15.0)
Lymphocytes Relative: 16.3 % (ref 12.0–46.0)
Lymphs Abs: 1.2 10*3/uL (ref 0.7–4.0)
MCHC: 33.4 g/dL (ref 30.0–36.0)
MCV: 91.6 fl (ref 78.0–100.0)
Monocytes Absolute: 0.6 10*3/uL (ref 0.1–1.0)
Monocytes Relative: 8.1 % (ref 3.0–12.0)
Neutro Abs: 5.5 10*3/uL (ref 1.4–7.7)
Neutrophils Relative %: 73.5 % (ref 43.0–77.0)
Platelets: 194 10*3/uL (ref 150.0–400.0)
RBC: 4.81 Mil/uL (ref 3.87–5.11)
RDW: 14.5 % (ref 11.5–15.5)
WBC: 7.5 10*3/uL (ref 4.0–10.5)

## 2022-10-02 LAB — LIPID PANEL
Cholesterol: 127 mg/dL (ref 0–200)
HDL: 61.1 mg/dL (ref >39.00)
LDL Cholesterol: 45 mg/dL (ref 0–99)
NonHDL: 65.96
Total CHOL/HDL Ratio: 2
Triglycerides: 106 mg/dL (ref 0.0–149.0)
VLDL: 21.2 mg/dL (ref 0.0–40.0)

## 2022-10-02 LAB — URINALYSIS, ROUTINE W REFLEX MICROSCOPIC
Bilirubin Urine: NEGATIVE
Ketones, ur: NEGATIVE
Leukocytes,Ua: NEGATIVE
Nitrite: NEGATIVE
Specific Gravity, Urine: 1.015 (ref 1.000–1.030)
Total Protein, Urine: NEGATIVE
Urine Glucose: >=1000 — AB
Urobilinogen, UA: 0.2 (ref 0.0–1.0)
pH: 5.5 (ref 5.0–8.0)

## 2022-10-02 LAB — VITAMIN B12: Vitamin B-12: 1039 pg/mL — ABNORMAL HIGH (ref 211–911)

## 2022-10-02 LAB — HEMOGLOBIN A1C: Hgb A1c MFr Bld: 7.6 % — ABNORMAL HIGH (ref 4.6–6.5)

## 2022-10-02 LAB — VITAMIN D 25 HYDROXY (VIT D DEFICIENCY, FRACTURES): VITD: 44.39 ng/mL (ref 30.00–100.00)

## 2022-10-02 NOTE — Assessment & Plan Note (Signed)
Chronic Taking vitamin D daily Check vitamin D level  

## 2022-10-02 NOTE — Progress Notes (Signed)
Electrophysiology Office Note   Date:  10/02/2022   ID:  Duke, Anita 1941/02/27, MRN [id number]  PCP:  Binnie Rail, MD  Cardiologist:  Aundra Dubin Primary Electrophysiologist:  Benito Lemmerman Meredith Leeds, MD    Chief Complaint: AF   History of Present Illness: Anita Duke is a 82 y.o. female who is being seen today for the evaluation of AF at the request of Burns, Claudina Lick, MD. Presenting today for electrophysiology evaluation.  He has a history significant for breast cancer postlumpectomy, coronary artery disease with remote CABG, mitral valve repair, diabetes, hypertension, hyperlipidemia, chronic systolic heart failure, COPD.  She has a history of Maze procedure but had more frequent episodes of atrial fibrillation and is now status post AV node ablation and Saint Jude dual-chamber pacemaker.  Today, she denies symptoms of palpitations, chest pain, shortness of breath, orthopnea, PND, lower extremity edema, claudication, dizziness, presyncope, syncope, bleeding, or neurologic sequela. The patient is tolerating medications without difficulties.    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   Breast cancer ([place])    CAD (coronary artery disease)    LIMA to the LAD at time of mitral valve repair / LIMA atretic,, February, 2011   Cataracts, bilateral    removed bilat    Cecal cancer 2014   colon   CHF (congestive heart failure)    Chronic systolic dysfunction of left ventricle 05/21/2013   Clotting disorder    21 yrs ago blood clot in heart    Colon cancer ([place])    Colon polyp, hyperplastic    Diabetes mellitus type II, controlled 03/02/2009   Diabetic neuropathy  02/21/2018   [facility] - [facility]   Diverticulosis of colon    Diverticulosis of large intestine 08/04/2002   Ejection fraction < 50%    EF 30%, New diagnosis   february, 2011, etiology not clear, consider rate related tachycardia, and not use carvedilol, catheterization no constriction, /    EF 55-60% echo, June, 2011 /     Beta blocker stopped May, 2012 with reactive airway disease.  Patient does not tolerate metoprolol.  Carvedilol stopped  /   EF 55%, echo, February, 2012  //   EF 40-45%, septal dyssynergy, hypokinesis of the a   Essential hypertension 07/07/2008   Family history of adverse reaction to anesthesia    sister had a hard time waking up after anesthesia   Family history of colon cancer 04/29/2021   GERD (gastroesophageal reflux disease)    Heart murmur    Hemorrhoids, internal    High cholesterol    History of CABG    Hyperlipidemia 07/07/2008   Lumbar radiculopathy, acute 06/17/2018   Major depressive disorder 07/19/2018   Mitral regurgitation    a. s/p repair with LAA ligation and MAZE at time of surgery   Mitral stenosis    Mild, February, 2011, post mitral valve repair / mild, echo, June, 2011  //  Mild functional mitral stenosis, echo, February, 2012  //   mild stenosis, echo, December, 2013  //  mild, echo, August, 2014   //  Mild, echo, April, 2016    Osteopenia    left hip    Pacemaker-Medtronic 05/20/2012   Pacemaker placed September, 2013   //   upgraded to biventricular CRT pacemaker November 04, 2013    Permanent atrial fibrillation    a. s/p MAZE b. s/p AVN ablation   Personal history of colonic polyps 04/29/2021   PONV (postoperative nausea and vomiting)    Pulmonary HTN    QT prolongation    Tikosyn and Effexor. QT prolonged October 13, 2011, peak is in dose reduced from 500  to -250 twice a day   Right ventricular dysfunction    Mild to moderate, echo, February, 2011 / normalized echo, June, 2011 /  RV normal, echo, February, 2012  //   right  ventricle reported as good echo, December, 2013    S/P mitral valve repair    [facility] / Maze procedure/ atrial appendage removed were tied off    Sick sinus syndrome    s/p Medtronic dual chamber PPM 05/17/12    Sleep difficulties 09/13/2019   Vitamin D deficiency 02/20/2014   Past Surgical History:  Procedure Laterality Date   ABDOMINAL HYSTERECTOMY  ?2002   AV NODE ABLATION N/A 05/28/2012   Procedure: AV NODE ABLATION;  Surgeon: Thompson Grayer, MD;  Location: [facility] CATH LAB;  Service: Cardiovascular;  Laterality: N/A;   BI-VENTRICULAR PACEMAKER UPGRADE N/A 11/04/2013   upgrade of previously implanted dual chamber pacemaker to STJ Quadra Allure CRTP by Dr Rayann Heman   BIV PACEMAKER GENERATOR CHANGEOUT N/A 06/22/2022   Procedure: BIV PACEMAKER GENERATOR CHANGEOUT;  Surgeon: Constance Haw, MD;  Location: [facility] CV LAB;  Service: Cardiovascular;  Laterality: N/A;   BREAST EXCISIONAL BIOPSY Left over 10 years ago   benign   BREAST LUMPECTOMY Left 05/26/2021   Procedure: LEFT BREAST LUMPECTOMY;  Surgeon: Stark Klein, MD;  Location: [place];  Service: General;  Laterality: Left;   BREAST SURGERY     CARDIOVERSION  02/08/2012   Procedure: CARDIOVERSION;  Surgeon: Carlena Bjornstad, MD;  Location: [place];  Service: Cardiovascular;  Laterality: N/A;   CARDIOVERSION  02/29/2012   Procedure: CARDIOVERSION;  Surgeon: Carlena Bjornstad, MD;  Location: [place];  Service: Cardiovascular;  Laterality: N/A;   CARDIOVERSION  05/15/2012   Procedure: CARDIOVERSION;  Surgeon: Carlena Bjornstad, MD;  Location: [place];  Service: Cardiovascular;  Laterality: N/A;   CATARACT EXTRACTION, BILATERAL  2017   [place]   COLON SURGERY  2014   right hemicolectomy    COLONOSCOPY     CORONARY ARTERY BYPASS GRAFT  2001   CABG X1 "at time of mitral valve repair" (12/04/2012   DIAGNOSTIC LAPAROSCOPIC LIVER BIOPSY Left 12/03/2012   Procedure: DIAGNOSTIC LAPAROSCOPIC LIVER BIOPSY;  Surgeon: Stark Klein,  MD;  Location: [place];  Service: General;  Laterality: Left;   EYE SURGERY     bilateral cataract surgery   LAPAROSCOPIC RIGHT HEMI COLECTOMY  12/03/2012   Procedure: LAPAROSCOPIC RIGHT HEMI COLECTOMY;  Surgeon: Stark Klein, MD;  Location: [place];  Service: General;;   LUMBAR LAMINECTOMY/ DECOMPRESSION WITH MET-RX Left 06/17/2018   Procedure: left Lumbar three-fourextraforaminal Microdiscectomy with Met-Rx;  Surgeon: Kristeen Miss, MD;  Location: [place];  Service: Neurosurgery;  Laterality: Left;   MAZE  2001   w/ MVR & CABG   MITRAL VALVE REPAIR  2001   "anterior and posterior leaflets" (12/04/2012)   PERMANENT PACEMAKER INSERTION N/A 05/17/2012   MDT Adapta L implanted by Dr  Allred for tachy/brady syndrome   POLYPECTOMY     RADIOACTIVE SEED GUIDED EXCISIONAL BREAST BIOPSY Right 07/04/2022   Procedure: RADIOACTIVE SEED GUIDED EXCISIONAL RIGHT BREAST BIOPSY;  Surgeon: Stark Klein, MD;  Location: [place];  Service: General;  Laterality: Right;   TONSILLECTOMY AND ADENOIDECTOMY  ~ 0109   UMBILICAL HERNIA REPAIR N/A 12/03/2012   Procedure: HERNIA REPAIR UMBILICAL ADULT;  Surgeon: Stark Klein, MD;  Location: [facility] OR;  Service: General;  Laterality: N/A;     Current Outpatient Medications  Medication Sig Dispense Refill   amoxicillin (AMOXIL) 500 MG capsule TAKE 4 CAPSULES PRIOR TO DENTAL APPOINTMENTS AS DIRECTED 12 capsule 0   atorvastatin (LIPITOR) 40 MG tablet Take 1 tablet (40 mg total) by mouth daily. 90 tablet 3   carvedilol (COREG) 12.5 MG tablet Take 1 tablet (12.5 mg total) by mouth 2 (two) times daily with a meal. 180 tablet 3   cholecalciferol (VITAMIN D) 1000 UNITS tablet Take 1,000 Units by mouth daily.      cyanocobalamin (VITAMIN B12) 500 MCG tablet Take 500 mcg by mouth daily.     empagliflozin (JARDIANCE) 25 MG TABS tablet Take 1 tablet (25 mg total) by mouth daily. 30 tablet 3   fish oil-omega-3 fatty acids 1000 MG capsule Take 1 g by mouth 2 (two) times daily.     loratadine  (CLARITIN) 10 MG tablet Take 10 mg by mouth daily.     Polyethyl Glycol-Propyl Glycol (SYSTANE) 0.4-0.3 % SOLN Place 1 drop into both eyes daily.     sacubitril-valsartan (ENTRESTO) 24-26 MG Take 1 tablet by mouth 2 (two) times daily. 60 tablet 6   sodium chloride (OCEAN) 0.65 % SOLN nasal spray Place 1 spray into both nostrils 2 (two) times daily.     spironolactone (ALDACTONE) 25 MG tablet Take 1 tablet (25 mg total) by mouth daily. 90 tablet 3   tamoxifen (NOLVADEX) 20 MG tablet Take 1 tablet (20 mg total) by mouth daily. 90 tablet 2   venlafaxine XR (EFFEXOR-XR) 37.5 MG 24 hr capsule Take 1 capsule (37.5 mg total) by mouth daily. 90 capsule 2   warfarin (COUMADIN) 2.5 MG tablet TAKE 1 TABLET BY MOUTH DAILY OR AS DIRECTED BY ANTICOAGULATION  CLINIC (Patient taking differently: Take 2.5 mg by mouth every evening.  OR AS DIRECTED BY ANTICOAGULATION  CLINIC) 104 tablet 3   No current facility-administered medications for this visit.    Allergies:   Demerol [meperidine], Morphine, Januvia [sitagliptin], Metformin and related, and Tetanus toxoid   Social History:  The patient  reports that she has never smoked. She has never used smokeless tobacco. She reports that she does not drink alcohol and does not use drugs.   Family History:  The patient's family history includes Alzheimer's disease in her mother; Brain cancer (age of onset: 63) in her nephew; Cancer in her maternal grandfather; Colon cancer in her cousin, maternal aunt, and maternal aunt; Depression in her mother; Diabetes in her father, mother, paternal grandfather, and paternal grandmother; Heart disease in her father, paternal uncle, and sister; Kidney disease in her mother; Thyroid disease in her mother.    ROS:  Please see the history of present illness.   Otherwise, review of systems is positive for none.   All other systems are reviewed and negative.    PHYSICAL EXAM: VS:  BP 132/68   Pulse 72   Ht 5' (1.524 m)   Wt 142 lb  (64.4 kg)   SpO2 95%   BMI  27.73 kg/m  , BMI Body mass index is 27.73 kg/m. GEN: Well nourished, well developed, in no acute distress  HEENT: normal  Neck: no JVD, carotid bruits, or masses Cardiac: RRR; no murmurs, rubs, or gallops,no edema  Respiratory:  clear to auscultation bilaterally, normal work of breathing GI: soft, nontender, nondistended, + BS MS: no deformity or atrophy  Skin: warm and dry, device pocket is well healed Neuro:  Strength and sensation are intact Psych: euthymic mood, full affect  EKG:  EKG is not ordered today. Personal review of the ekg ordered 09/07/22 shows AF, V paced  Device interrogation is reviewed today in detail.  See PaceArt for details.   Recent Labs: 10/02/2022: ALT 15; BUN 15; Creatinine, Ser 0.62; Hemoglobin 14.7; Platelets 194.0; Potassium 3.2; Sodium 142; TSH 0.99    Lipid Panel     Component Value Date/Time   CHOL 127 10/02/2022 1000   TRIG 106.0 10/02/2022 1000   HDL 61.10 10/02/2022 1000   CHOLHDL 2 10/02/2022 1000   VLDL 21.2 10/02/2022 1000   LDLCALC 45 10/02/2022 1000   LDLCALC 52 03/16/2020 0957   LDLDIRECT 65.0 01/05/2016 0942     Wt Readings from Last 3 Encounters:  10/02/22 142 lb (64.4 kg)  10/02/22 143 lb (64.9 kg)  09/07/22 144 lb 6.4 oz (65.5 kg)      Other studies Reviewed: Additional studies/ records that were reviewed today include: TTE 09/02/21  Review of the above records today demonstrates:   1. Left ventricular ejection fraction, by estimation, is 45 to 50%. The  left ventricle has mildly decreased function. The left ventricle  demonstrates global hypokinesis. There is mild left ventricular  hypertrophy. Left ventricular diastolic parameters  are indeterminate.   2. Right ventricular systolic function is mildly reduced. The right  ventricular size is normal. There is normal pulmonary artery systolic  pressure. The estimated right ventricular systolic pressure is 82.9 mmHg.   3. Left atrial size was  severely dilated.   4. Right atrial size was mildly dilated.   5. S/p mitral valve repair. Trivial mitral regurgitation. No significant  stenosis with mean gradient 2 mmHg.   6. The aortic valve is tricuspid. Aortic valve regurgitation is not  visualized. No aortic stenosis is present.   7. The inferior vena cava is normal in size with greater than 50%  respiratory variability, suggesting right atrial pressure of 3 mmHg.    ASSESSMENT AND PLAN:  1.  Permanent atrial fibrillation: Status post AV node ablation and Saint Jude dual-chamber pacemaker.  Device functioning appropriately.  No changes at this time.  2.  Coronary artery disease: No current angina  3.  Chronic systolic heart failure: Ejection fraction 45 to 50%.  No obvious volume overload.  Continue management per primary cardiology  4.  Mitral valve repair: Stable on most recent echo.  Plan per primary cardiology.  5.  Secondary hypercoagulable state: Currently on warfarin for atrial fibrillation as above  Current medicines are reviewed at length with the patient today.   The patient does not have concerns regarding her medicines.  The following changes were made today:  none  Labs/ tests ordered today include:  No orders of the defined types were placed in this encounter.    Disposition:   FU 6 months  Signed, Sosaia Pittinger Meredith Leeds, MD  10/02/2022 2:39 PM     [facility] [address] [place]  [address] [phone] (office) [phone] (fax)

## 2022-10-02 NOTE — Assessment & Plan Note (Signed)
Chronic Taking B12 supplementation Check level

## 2022-10-02 NOTE — Assessment & Plan Note (Signed)
Chronic Blood pressure well controlled CMP Continue Coreg 12.5 mg twice daily, Entresto 24-26 mg twice daily, spironolactone 25 mg daily

## 2022-10-02 NOTE — Assessment & Plan Note (Signed)
Chronic Intermittent Has sensitive gag reflex - if anything hits the back of her throat in a certain area she has a spasm Can not breath then this happens Cold air helps

## 2022-10-02 NOTE — Assessment & Plan Note (Signed)
Chronic Not on any medication at this time Follows with podiatry

## 2022-10-02 NOTE — Assessment & Plan Note (Signed)
Chronic Controlled, Stable Continue venlafaxine 37.5 mg daily 

## 2022-10-02 NOTE — Patient Instructions (Signed)
Medication Instructions:  Your physician recommends that you continue on your current medications as directed. Please refer to the Current Medication list given to you today.  *If you need a refill on your cardiac medications before your next appointment, please call your pharmacy*   Lab Work: None ordered   Testing/Procedures: None ordered   Follow-Up: At [facility], you and your health needs are our priority.  As part of our continuing mission to provide you with exceptional heart care, we have created designated Provider Care Teams.  These Care Teams include your primary Cardiologist (physician) and Advanced Practice Providers (APPs -  Physician Assistants and Nurse Practitioners) who all work together to provide you with the care you need, when you need it.  Remote monitoring is used to monitor your Pacemaker or ICD from home. This monitoring reduces the number of office visits required to check your device to one time per year. It allows [name] to keep an eye on the functioning of your device to ensure it is working properly. You are scheduled for a device check from home on 12/22/2022. You may send your transmission at any time that day. If you have a wireless device, the transmission will be sent automatically. After your physician reviews your transmission, you will receive a postcard with your next transmission date.  Your next appointment:   6 month(s)  The format for your next appointment:   In Person  Provider:   Allegra Lai, MD    Thank you for choosing [facility]!!   Trinidad Curet, RN [phone]

## 2022-10-02 NOTE — Assessment & Plan Note (Signed)
Chronic   Lab Results  Component Value Date   HGBA1C 7.2 (H) 06/28/2022   Sugars not ideally controlled Check A1c, urine microalbumin today, urinalysis Continue Jardiance 25 mg daily Stressed regular exercise, diabetic diet

## 2022-10-02 NOTE — Assessment & Plan Note (Signed)
Chronic Follows with cardiology Appears euvolemic Continue Delene Loll, Jardiance and spironolactone

## 2022-10-02 NOTE — Assessment & Plan Note (Signed)
Chronic Following with cardiology On warfarin which is monitored at our Coumadin clinic Rate controlled with carvedilol 12.5 mg twice daily CBC, CMP

## 2022-10-02 NOTE — Telephone Encounter (Signed)
Pt states she do not want to do remotes anymore due to the bill. She states she would rather come into the office every 6 months.

## 2022-10-02 NOTE — Assessment & Plan Note (Signed)
Chronic States she has a runny nose intermittently for no reason She wonders what she can take Currently taking Claritin daily-will try switching to Zyrtec to see if that helps If that is not helpful advised I can prescribe her ipratropium nasal spray

## 2022-10-02 NOTE — Assessment & Plan Note (Signed)
Chronic DEXA due this summer-ordered-to be scheduled in August Continue regular walking Continue vitamin D supplementation-will check level Start calcium 5-600 mg once daily, eat a high calcium diet Last bone density showed improvement-likely secondary to the tamoxifen

## 2022-10-02 NOTE — Assessment & Plan Note (Signed)
Chronic Regular exercise and healthy diet encouraged Check lipid panel  Continue atorvastatin 40 mg daily 

## 2022-10-02 NOTE — Assessment & Plan Note (Signed)
Chronic Follow-up with cardiology No symptoms consistent with angina On warfarin, Coreg, Jardiance

## 2022-10-04 LAB — CULTURE, URINE COMPREHENSIVE
MICRO NUMBER:: 14485988
SPECIMEN QUALITY:: ADEQUATE

## 2022-10-09 NOTE — Progress Notes (Signed)
Remote pacemaker transmission.   

## 2022-10-13 ENCOUNTER — Telehealth: Payer: Self-pay | Admitting: Internal Medicine

## 2022-10-13 ENCOUNTER — Other Ambulatory Visit (HOSPITAL_COMMUNITY): Payer: Self-pay

## 2022-10-13 MED ORDER — AMOXICILLIN 500 MG PO CAPS
ORAL_CAPSULE | ORAL | 0 refills | Status: AC
  Filled 2022-10-13: qty 20, 5d supply, fill #0

## 2022-10-13 NOTE — Telephone Encounter (Signed)
PT calls today in regards to upcoming dental procedures. PT stated for their procedures in the past they had been prescribed amoxicillin (AMOXIL) 500 MG capsule (stated this was needed due to PT's heart conditions). PT will have up to four procedures within the next month and was wanting to try and get these before they started (PT did not have an exact date for the first procedure).  I know typically we set an office visit up for any type of antibiotics but I wasn't sure how this would work for this PT's case.  CB if needed: [phone]  If it can be filled PT would like it sent to  [facility]

## 2022-10-17 ENCOUNTER — Other Ambulatory Visit (HOSPITAL_COMMUNITY): Payer: Self-pay

## 2022-10-18 ENCOUNTER — Other Ambulatory Visit (HOSPITAL_COMMUNITY): Payer: Self-pay

## 2022-10-24 ENCOUNTER — Ambulatory Visit: Payer: Medicare HMO

## 2022-10-24 ENCOUNTER — Ambulatory Visit (INDEPENDENT_AMBULATORY_CARE_PROVIDER_SITE_OTHER): Payer: Medicare HMO

## 2022-10-24 DIAGNOSIS — Z7901 Long term (current) use of anticoagulants: Secondary | ICD-10-CM | POA: Diagnosis not present

## 2022-10-24 LAB — POCT INR: INR: 2.9 (ref 2.0–3.0)

## 2022-10-24 NOTE — Progress Notes (Signed)
Continue 1 tablet daily except take 1 1/2 tablets on Fridays and recheck in 4 weeks.

## 2022-10-24 NOTE — Patient Instructions (Addendum)
Pre visit review using our clinic review tool, if applicable. No additional management support is needed unless otherwise documented below in the visit note.  Continue 1 tablet daily except take 1 1/2 tablets on Fridays and recheck in 4 weeks.

## 2022-11-14 ENCOUNTER — Ambulatory Visit (INDEPENDENT_AMBULATORY_CARE_PROVIDER_SITE_OTHER): Payer: Medicare HMO

## 2022-11-14 DIAGNOSIS — Z7901 Long term (current) use of anticoagulants: Secondary | ICD-10-CM | POA: Diagnosis not present

## 2022-11-14 LAB — POCT INR: INR: 1.6 — AB (ref 2.0–3.0)

## 2022-11-14 NOTE — Progress Notes (Signed)
Increase dose today to take 1 1/2 tablets and increase dose tomorrow to take 1 1/2 tablets and then continue 1 tablet daily except take 1 1/2 tablets on Fridays and recheck in 3 weeks.

## 2022-11-14 NOTE — Patient Instructions (Addendum)
Pre visit review using our clinic review tool, if applicable. No additional management support is needed unless otherwise documented below in the visit note.  Increase dose today to take 1 1/2 tablets and increase dose tomorrow to take 1 1/2 tablets and then continue 1 tablet daily except take 1 1/2 tablets on Fridays and recheck in 3 weeks.

## 2022-11-15 ENCOUNTER — Ambulatory Visit
Admission: RE | Admit: 2022-11-15 | Discharge: 2022-11-15 | Disposition: A | Discharge status: Home / Self Care | Payer: Medicare HMO | Source: Ambulatory Visit | Attending: Nurse Practitioner | Admitting: Nurse Practitioner

## 2022-11-15 DIAGNOSIS — Z853 Personal history of malignant neoplasm of breast: Secondary | ICD-10-CM | POA: Diagnosis not present

## 2022-11-15 DIAGNOSIS — C50112 Malignant neoplasm of central portion of left female breast: Secondary | ICD-10-CM

## 2022-11-15 DIAGNOSIS — R922 Inconclusive mammogram: Secondary | ICD-10-CM | POA: Diagnosis not present

## 2022-11-17 ENCOUNTER — Other Ambulatory Visit (HOSPITAL_COMMUNITY): Payer: Self-pay | Admitting: Cardiology

## 2022-11-21 ENCOUNTER — Ambulatory Visit: Payer: Medicare HMO

## 2022-11-25 ENCOUNTER — Inpatient Hospital Stay (HOSPITAL_COMMUNITY)
Admission: EM | Admit: 2022-11-25 | Discharge: 2022-11-29 | DRG: 280 | Disposition: A | Discharge status: Home / Self Care | Payer: Medicare HMO | Attending: Internal Medicine | Admitting: Internal Medicine

## 2022-11-25 ENCOUNTER — Emergency Department (HOSPITAL_COMMUNITY): Payer: Medicare HMO

## 2022-11-25 ENCOUNTER — Other Ambulatory Visit: Payer: Self-pay

## 2022-11-25 ENCOUNTER — Encounter (HOSPITAL_COMMUNITY): Payer: Self-pay

## 2022-11-25 DIAGNOSIS — E1169 Type 2 diabetes mellitus with other specified complication: Secondary | ICD-10-CM | POA: Diagnosis present

## 2022-11-25 DIAGNOSIS — Z953 Presence of xenogenic heart valve: Secondary | ICD-10-CM | POA: Diagnosis not present

## 2022-11-25 DIAGNOSIS — Z885 Allergy status to narcotic agent status: Secondary | ICD-10-CM

## 2022-11-25 DIAGNOSIS — Z1152 Encounter for screening for COVID-19: Secondary | ICD-10-CM | POA: Diagnosis not present

## 2022-11-25 DIAGNOSIS — I2510 Atherosclerotic heart disease of native coronary artery without angina pectoris: Secondary | ICD-10-CM | POA: Diagnosis present

## 2022-11-25 DIAGNOSIS — B3322 Viral myocarditis: Secondary | ICD-10-CM | POA: Diagnosis not present

## 2022-11-25 DIAGNOSIS — J129 Viral pneumonia, unspecified: Secondary | ICD-10-CM

## 2022-11-25 DIAGNOSIS — I442 Atrioventricular block, complete: Secondary | ICD-10-CM | POA: Diagnosis present

## 2022-11-25 DIAGNOSIS — R0600 Dyspnea, unspecified: Secondary | ICD-10-CM | POA: Diagnosis not present

## 2022-11-25 DIAGNOSIS — J189 Pneumonia, unspecified organism: Secondary | ICD-10-CM | POA: Diagnosis not present

## 2022-11-25 DIAGNOSIS — E1140 Type 2 diabetes mellitus with diabetic neuropathy, unspecified: Secondary | ICD-10-CM | POA: Diagnosis not present

## 2022-11-25 DIAGNOSIS — E876 Hypokalemia: Secondary | ICD-10-CM | POA: Diagnosis not present

## 2022-11-25 DIAGNOSIS — Z833 Family history of diabetes mellitus: Secondary | ICD-10-CM

## 2022-11-25 DIAGNOSIS — R739 Hyperglycemia, unspecified: Secondary | ICD-10-CM | POA: Diagnosis not present

## 2022-11-25 DIAGNOSIS — J449 Chronic obstructive pulmonary disease, unspecified: Secondary | ICD-10-CM | POA: Diagnosis not present

## 2022-11-25 DIAGNOSIS — Z950 Presence of cardiac pacemaker: Secondary | ICD-10-CM

## 2022-11-25 DIAGNOSIS — J123 Human metapneumovirus pneumonia: Secondary | ICD-10-CM | POA: Diagnosis present

## 2022-11-25 DIAGNOSIS — M19042 Primary osteoarthritis, left hand: Secondary | ICD-10-CM | POA: Diagnosis present

## 2022-11-25 DIAGNOSIS — I10 Essential (primary) hypertension: Secondary | ICD-10-CM | POA: Diagnosis present

## 2022-11-25 DIAGNOSIS — Z808 Family history of malignant neoplasm of other organs or systems: Secondary | ICD-10-CM

## 2022-11-25 DIAGNOSIS — E785 Hyperlipidemia, unspecified: Secondary | ICD-10-CM | POA: Insufficient documentation

## 2022-11-25 DIAGNOSIS — Z7901 Long term (current) use of anticoagulants: Secondary | ICD-10-CM

## 2022-11-25 DIAGNOSIS — K219 Gastro-esophageal reflux disease without esophagitis: Secondary | ICD-10-CM | POA: Diagnosis present

## 2022-11-25 DIAGNOSIS — I428 Other cardiomyopathies: Secondary | ICD-10-CM | POA: Diagnosis not present

## 2022-11-25 DIAGNOSIS — H903 Sensorineural hearing loss, bilateral: Secondary | ICD-10-CM | POA: Diagnosis not present

## 2022-11-25 DIAGNOSIS — I4891 Unspecified atrial fibrillation: Secondary | ICD-10-CM | POA: Diagnosis not present

## 2022-11-25 DIAGNOSIS — R9431 Abnormal electrocardiogram [ECG] [EKG]: Secondary | ICD-10-CM | POA: Insufficient documentation

## 2022-11-25 DIAGNOSIS — I4821 Permanent atrial fibrillation: Secondary | ICD-10-CM | POA: Diagnosis present

## 2022-11-25 DIAGNOSIS — Z820 Family history of epilepsy and other diseases of the nervous system: Secondary | ICD-10-CM

## 2022-11-25 DIAGNOSIS — Z951 Presence of aortocoronary bypass graft: Secondary | ICD-10-CM | POA: Diagnosis not present

## 2022-11-25 DIAGNOSIS — I214 Non-ST elevation (NSTEMI) myocardial infarction: Principal | ICD-10-CM | POA: Diagnosis present

## 2022-11-25 DIAGNOSIS — Z853 Personal history of malignant neoplasm of breast: Secondary | ICD-10-CM

## 2022-11-25 DIAGNOSIS — I272 Pulmonary hypertension, unspecified: Secondary | ICD-10-CM | POA: Diagnosis not present

## 2022-11-25 DIAGNOSIS — I110 Hypertensive heart disease with heart failure: Secondary | ICD-10-CM | POA: Diagnosis present

## 2022-11-25 DIAGNOSIS — Z85038 Personal history of other malignant neoplasm of large intestine: Secondary | ICD-10-CM | POA: Diagnosis not present

## 2022-11-25 DIAGNOSIS — Z818 Family history of other mental and behavioral disorders: Secondary | ICD-10-CM

## 2022-11-25 DIAGNOSIS — Z888 Allergy status to other drugs, medicaments and biological substances status: Secondary | ICD-10-CM

## 2022-11-25 DIAGNOSIS — Z7984 Long term (current) use of oral hypoglycemic drugs: Secondary | ICD-10-CM | POA: Diagnosis not present

## 2022-11-25 DIAGNOSIS — I514 Myocarditis, unspecified: Secondary | ICD-10-CM

## 2022-11-25 DIAGNOSIS — Z800 Family history of malignant neoplasm of digestive organs: Secondary | ICD-10-CM

## 2022-11-25 DIAGNOSIS — Z79899 Other long term (current) drug therapy: Secondary | ICD-10-CM

## 2022-11-25 DIAGNOSIS — Z8349 Family history of other endocrine, nutritional and metabolic diseases: Secondary | ICD-10-CM

## 2022-11-25 DIAGNOSIS — I495 Sick sinus syndrome: Secondary | ICD-10-CM | POA: Diagnosis not present

## 2022-11-25 DIAGNOSIS — Z8249 Family history of ischemic heart disease and other diseases of the circulatory system: Secondary | ICD-10-CM

## 2022-11-25 DIAGNOSIS — E7800 Pure hypercholesterolemia, unspecified: Secondary | ICD-10-CM | POA: Diagnosis present

## 2022-11-25 DIAGNOSIS — Z887 Allergy status to serum and vaccine status: Secondary | ICD-10-CM

## 2022-11-25 DIAGNOSIS — Z602 Problems related to living alone: Secondary | ICD-10-CM | POA: Diagnosis present

## 2022-11-25 DIAGNOSIS — C50112 Malignant neoplasm of central portion of left female breast: Secondary | ICD-10-CM | POA: Diagnosis present

## 2022-11-25 DIAGNOSIS — M8580 Other specified disorders of bone density and structure, unspecified site: Secondary | ICD-10-CM | POA: Diagnosis present

## 2022-11-25 DIAGNOSIS — I420 Dilated cardiomyopathy: Secondary | ICD-10-CM

## 2022-11-25 DIAGNOSIS — R531 Weakness: Secondary | ICD-10-CM | POA: Diagnosis not present

## 2022-11-25 DIAGNOSIS — M170 Bilateral primary osteoarthritis of knee: Secondary | ICD-10-CM | POA: Diagnosis present

## 2022-11-25 DIAGNOSIS — I252 Old myocardial infarction: Secondary | ICD-10-CM

## 2022-11-25 DIAGNOSIS — Z841 Family history of disorders of kidney and ureter: Secondary | ICD-10-CM

## 2022-11-25 DIAGNOSIS — I5043 Acute on chronic combined systolic (congestive) and diastolic (congestive) heart failure: Secondary | ICD-10-CM | POA: Diagnosis not present

## 2022-11-25 DIAGNOSIS — Z9049 Acquired absence of other specified parts of digestive tract: Secondary | ICD-10-CM

## 2022-11-25 DIAGNOSIS — M19041 Primary osteoarthritis, right hand: Secondary | ICD-10-CM | POA: Diagnosis present

## 2022-11-25 DIAGNOSIS — E1159 Type 2 diabetes mellitus with other circulatory complications: Secondary | ICD-10-CM | POA: Diagnosis present

## 2022-11-25 DIAGNOSIS — Z743 Need for continuous supervision: Secondary | ICD-10-CM | POA: Diagnosis not present

## 2022-11-25 DIAGNOSIS — R0602 Shortness of breath: Secondary | ICD-10-CM | POA: Diagnosis not present

## 2022-11-25 DIAGNOSIS — R059 Cough, unspecified: Secondary | ICD-10-CM | POA: Diagnosis not present

## 2022-11-25 DIAGNOSIS — R079 Chest pain, unspecified: Secondary | ICD-10-CM | POA: Diagnosis not present

## 2022-11-25 DIAGNOSIS — J9811 Atelectasis: Secondary | ICD-10-CM | POA: Diagnosis not present

## 2022-11-25 LAB — GLUCOSE, CAPILLARY
Glucose-Capillary: 168 mg/dL — ABNORMAL HIGH (ref 70–99)
Glucose-Capillary: 148 mg/dL — ABNORMAL HIGH (ref 70–99)

## 2022-11-25 LAB — RESPIRATORY PANEL BY PCR

## 2022-11-25 LAB — CBC WITH DIFFERENTIAL/PLATELET
Abs Immature Granulocytes: 0.04 10*3/uL (ref 0.00–0.07)
Basophils Absolute: 0 10*3/uL (ref 0.0–0.1)
Basophils Relative: 0 %
Eosinophils Absolute: 0 10*3/uL (ref 0.0–0.5)
Eosinophils Relative: 0 %
HCT: 49.3 % — ABNORMAL HIGH (ref 36.0–46.0)
Hemoglobin: 16.2 g/dL — ABNORMAL HIGH (ref 12.0–15.0)
Immature Granulocytes: 1 %
Lymphocytes Relative: 21 %
Lymphs Abs: 1.4 10*3/uL (ref 0.7–4.0)
MCH: 30.8 pg (ref 26.0–34.0)
MCHC: 32.9 g/dL (ref 30.0–36.0)
MCV: 93.7 fL (ref 80.0–100.0)
Monocytes Absolute: 0.7 10*3/uL (ref 0.1–1.0)
Monocytes Relative: 11 %
Neutro Abs: 4.4 10*3/uL (ref 1.7–7.7)
Neutrophils Relative %: 67 %
Platelets: 204 10*3/uL (ref 150–400)
RBC: 5.26 MIL/uL — ABNORMAL HIGH (ref 3.87–5.11)
RDW: 13.4 % (ref 11.5–15.5)
WBC: 6.5 10*3/uL (ref 4.0–10.5)
nRBC: 0 % (ref 0.0–0.2)

## 2022-11-25 LAB — I-STAT CHEM 8, ED
BUN: 30 mg/dL — ABNORMAL HIGH (ref 8–23)
Calcium, Ion: 0.96 mmol/L — ABNORMAL LOW (ref 1.15–1.40)
Chloride: 108 mmol/L (ref 98–111)
Creatinine, Ser: 0.6 mg/dL (ref 0.44–1.00)
Glucose, Bld: 240 mg/dL — ABNORMAL HIGH (ref 70–99)
HCT: 49 % — ABNORMAL HIGH (ref 36.0–46.0)
Hemoglobin: 16.7 g/dL — ABNORMAL HIGH (ref 12.0–15.0)
Potassium: 4.4 mmol/L (ref 3.5–5.1)
Sodium: 139 mmol/L (ref 135–145)
TCO2: 18 mmol/L — ABNORMAL LOW (ref 22–32)

## 2022-11-25 LAB — RESP PANEL BY RT-PCR (RSV, FLU A&B, COVID)  RVPGX2
Influenza A by PCR: NEGATIVE
Influenza B by PCR: NEGATIVE
Resp Syncytial Virus by PCR: NEGATIVE
SARS Coronavirus 2 by RT PCR: NEGATIVE

## 2022-11-25 LAB — PROTIME-INR
INR: 2.4 — ABNORMAL HIGH (ref 0.8–1.2)
Prothrombin Time: 25.9 seconds — ABNORMAL HIGH (ref 11.4–15.2)

## 2022-11-25 LAB — COMPREHENSIVE METABOLIC PANEL
ALT: 34 U/L (ref 0–44)
AST: 66 U/L — ABNORMAL HIGH (ref 15–41)
Albumin: 3.9 g/dL (ref 3.5–5.0)
Alkaline Phosphatase: 55 U/L (ref 38–126)
Anion gap: 15 (ref 5–15)
BUN: 20 mg/dL (ref 8–23)
CO2: 17 mmol/L — ABNORMAL LOW (ref 22–32)
Calcium: 8.7 mg/dL — ABNORMAL LOW (ref 8.9–10.3)
Chloride: 105 mmol/L (ref 98–111)
Creatinine, Ser: 0.88 mg/dL (ref 0.44–1.00)
GFR, Estimated: >60 mL/min (ref >60)
Glucose, Bld: 243 mg/dL — ABNORMAL HIGH (ref 70–99)
Potassium: 3.7 mmol/L (ref 3.5–5.1)
Sodium: 137 mmol/L (ref 135–145)
Total Bilirubin: 1.5 mg/dL — ABNORMAL HIGH (ref 0.3–1.2)
Total Protein: 6.7 g/dL (ref 6.5–8.1)

## 2022-11-25 LAB — TROPONIN I (HIGH SENSITIVITY)
Troponin I (High Sensitivity): 3029 ng/L (ref <18)
Troponin I (High Sensitivity): 2705 ng/L (ref <18)

## 2022-11-25 MED ORDER — EMPAGLIFLOZIN 25 MG PO TABS
25.0000 mg | ORAL_TABLET | Freq: Every day | ORAL | Status: DC
  Administered 2022-11-25 – 2022-11-28 (×4): 25 mg via ORAL
  Filled 2022-11-25 (×5): qty 1

## 2022-11-25 MED ORDER — SODIUM CHLORIDE 0.9 % IV SOLN
500.0000 mg | Freq: Once | INTRAVENOUS | Status: AC
  Administered 2022-11-25: 500 mg via INTRAVENOUS
  Filled 2022-11-25: qty 5

## 2022-11-25 MED ORDER — ONDANSETRON HCL 4 MG PO TABS
4.0000 mg | ORAL_TABLET | Freq: Four times a day (QID) | ORAL | Status: DC | PRN
  Administered 2022-11-26: 4 mg via ORAL
  Filled 2022-11-25: qty 1

## 2022-11-25 MED ORDER — LACTATED RINGERS IV BOLUS
500.0000 mL | Freq: Once | INTRAVENOUS | Status: AC
  Administered 2022-11-25: 500 mL via INTRAVENOUS

## 2022-11-25 MED ORDER — ACETAMINOPHEN 650 MG RE SUPP: 650.0000 mg | Freq: Four times a day (QID) | RECTAL | Status: DC | PRN

## 2022-11-25 MED ORDER — INSULIN ASPART 100 UNIT/ML IJ SOLN
0.0000 [IU] | Freq: Three times a day (TID) | INTRAMUSCULAR | Status: DC
  Administered 2022-11-25: 2 [IU] via SUBCUTANEOUS
  Administered 2022-11-26: 1 [IU] via SUBCUTANEOUS
  Administered 2022-11-27 – 2022-11-28 (×2): 2 [IU] via SUBCUTANEOUS

## 2022-11-25 MED ORDER — OMEGA-3-ACID ETHYL ESTERS 1 G PO CAPS
1.0000 g | ORAL_CAPSULE | Freq: Two times a day (BID) | ORAL | Status: DC
  Administered 2022-11-25 – 2022-11-29 (×8): 1 g via ORAL
  Filled 2022-11-25 (×8): qty 1

## 2022-11-25 MED ORDER — CARVEDILOL 12.5 MG PO TABS
12.5000 mg | ORAL_TABLET | Freq: Two times a day (BID) | ORAL | Status: DC
  Administered 2022-11-25 – 2022-11-29 (×8): 12.5 mg via ORAL
  Filled 2022-11-25 (×8): qty 1

## 2022-11-25 MED ORDER — VITAMIN B-12 1000 MCG PO TABS
500.0000 ug | ORAL_TABLET | Freq: Every day | ORAL | Status: DC
  Administered 2022-11-25 – 2022-11-29 (×5): 500 ug via ORAL
  Filled 2022-11-25 (×5): qty 1

## 2022-11-25 MED ORDER — SACUBITRIL-VALSARTAN 24-26 MG PO TABS
1.0000 | ORAL_TABLET | Freq: Two times a day (BID) | ORAL | Status: DC
  Administered 2022-11-25 – 2022-11-29 (×8): 1 via ORAL
  Filled 2022-11-25 (×8): qty 1

## 2022-11-25 MED ORDER — LORATADINE 10 MG PO TABS
10.0000 mg | ORAL_TABLET | Freq: Every day | ORAL | Status: DC
  Administered 2022-11-25 – 2022-11-29 (×5): 10 mg via ORAL
  Filled 2022-11-25 (×5): qty 1

## 2022-11-25 MED ORDER — VENLAFAXINE HCL ER 37.5 MG PO CP24
37.5000 mg | ORAL_CAPSULE | Freq: Every day | ORAL | Status: DC
  Administered 2022-11-25 – 2022-11-29 (×5): 37.5 mg via ORAL
  Filled 2022-11-25 (×6): qty 1

## 2022-11-25 MED ORDER — ACETAMINOPHEN 325 MG PO TABS
650.0000 mg | ORAL_TABLET | Freq: Four times a day (QID) | ORAL | Status: DC | PRN
  Administered 2022-11-26: 650 mg via ORAL
  Filled 2022-11-25 (×2): qty 2

## 2022-11-25 MED ORDER — SODIUM CHLORIDE 0.9 % IV SOLN
1.0000 g | Freq: Once | INTRAVENOUS | Status: AC
  Administered 2022-11-25: 1 g via INTRAVENOUS
  Filled 2022-11-25: qty 10

## 2022-11-25 MED ORDER — VITAMIN D 25 MCG (1000 UNIT) PO TABS
1000.0000 [IU] | ORAL_TABLET | Freq: Every day | ORAL | Status: DC
  Administered 2022-11-25 – 2022-11-29 (×5): 1000 [IU] via ORAL
  Filled 2022-11-25 (×5): qty 1

## 2022-11-25 MED ORDER — ATORVASTATIN CALCIUM 40 MG PO TABS
40.0000 mg | ORAL_TABLET | Freq: Every day | ORAL | Status: DC
  Administered 2022-11-25 – 2022-11-29 (×5): 40 mg via ORAL
  Filled 2022-11-25 (×5): qty 1

## 2022-11-25 MED ORDER — ONDANSETRON HCL 4 MG/2ML IJ SOLN: 4.0000 mg | Freq: Four times a day (QID) | INTRAMUSCULAR | Status: DC | PRN

## 2022-11-25 MED ORDER — TAMOXIFEN CITRATE 10 MG PO TABS
20.0000 mg | ORAL_TABLET | Freq: Every day | ORAL | Status: DC
  Administered 2022-11-25 – 2022-11-29 (×5): 20 mg via ORAL
  Filled 2022-11-25 (×5): qty 2

## 2022-11-25 MED ORDER — SPIRONOLACTONE 25 MG PO TABS
25.0000 mg | ORAL_TABLET | Freq: Every day | ORAL | Status: DC
  Administered 2022-11-25 – 2022-11-28 (×4): 25 mg via ORAL
  Filled 2022-11-25 (×4): qty 1

## 2022-11-25 NOTE — ED Notes (Signed)
Pt's troponin is 3029. MD made aware.

## 2022-11-25 NOTE — Assessment & Plan Note (Signed)
Continue blood pressure control with carvedilol, spironolactone and entresto.

## 2022-11-25 NOTE — Assessment & Plan Note (Signed)
Follow up as outpatient.  

## 2022-11-25 NOTE — Progress Notes (Signed)
ANTICOAGULATION CONSULT NOTE - Initial Consult  Pharmacy Consult for heparin  Indication: chest pain/ACS  Allergies  Allergen Reactions   Demerol [Meperidine] Nausea And Vomiting   Morphine Nausea And Vomiting   Januvia [Sitagliptin] Other (See Comments)    Headaches, did not feel well   Metformin And Related Diarrhea   Tetanus Toxoid Rash and Other (See Comments)    "years ago"     Vital Signs: Temp: 97.8 F (36.6 C) (03/23 0853) BP: 121/60 (03/23 0915) Pulse Rate: 69 (03/23 1130)  Labs: Recent Labs    11/25/22 0900 11/25/22 0929  HGB 16.2* 16.7*  HCT 49.3* 49.0*  PLT 204  --   CREATININE 0.88 0.60  TROPONINIHS 3,029*  --     CrCl cannot be calculated (Unknown ideal weight.).   Medical History: 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   Breast cancer ([place])    CAD (coronary artery disease)    LIMA to the LAD at time of mitral valve repair / LIMA atretic,, February, 2011   Cataracts, bilateral    removed bilat    Cecal cancer 2014   colon   CHF (congestive heart failure)    Chronic systolic dysfunction of left ventricle 05/21/2013   Clotting disorder    21 yrs ago blood clot in heart    Colon cancer ([place])    Colon polyp, hyperplastic    Diabetes mellitus type II, controlled 03/02/2009   Diabetic neuropathy 02/21/2018   [facility]   Diverticulosis of colon    Diverticulosis of large intestine 08/04/2002   Ejection fraction < 50%    EF 30%, New diagnosis   february, 2011, etiology not clear, consider rate related tachycardia, and not use carvedilol, catheterization no constriction, /    EF 55-60% echo, June, 2011 /     Beta blocker stopped  May, 2012 with reactive airway disease.  Patient does not tolerate metoprolol.  Carvedilol stopped  /   EF 55%, echo, February, 2012  //   EF 40-45%, septal dyssynergy, hypokinesis of the a   Essential hypertension 07/07/2008   Family history of adverse reaction to anesthesia    sister had a hard time waking up after anesthesia   Family history of colon cancer 04/29/2021   GERD (gastroesophageal reflux disease)    Heart murmur    Hemorrhoids, internal    High cholesterol    History of CABG    Hyperlipidemia 07/07/2008   Lumbar radiculopathy, acute 06/17/2018   Major depressive disorder 07/19/2018   Mitral regurgitation    a. s/p repair with LAA ligation and MAZE at time of surgery   Mitral stenosis    Mild, February, 2011, post mitral valve repair / mild, echo, June, 2011  //  Mild functional mitral stenosis, echo, February, 2012  //   mild stenosis, echo, December, 2013  //  mild, echo, August, 2014   //   Mild, echo, April, 2016    Osteopenia    left hip    Pacemaker-Medtronic 05/20/2012   Pacemaker placed September, 2013   //   upgraded to biventricular CRT  pacemaker November 04, 2013    Permanent atrial fibrillation    a. s/p MAZE b. s/p AVN ablation   Personal history of colonic polyps 04/29/2021   PONV (postoperative nausea and vomiting)    Pulmonary HTN    QT prolongation    Tikosyn and Effexor. QT prolonged October 13, 2011, peak is in dose reduced from 500  to -250 twice a day   Right ventricular dysfunction    Mild to moderate, echo, February, 2011 / normalized echo, June, 2011 /  RV normal, echo, February, 2012  //   right ventricle reported as good echo, December, 2013    S/P mitral valve repair    [facility] / Maze procedure/ atrial appendage removed were tied off    Sick sinus syndrome    s/p Medtronic dual chamber PPM 05/17/12    Sleep difficulties 09/13/2019   Vitamin D deficiency 02/20/2014    Assessment: Patient admitted with CC of chest pain. Trop elevated to  2705. 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 INR therapeutic today at 2.4, med rec not complete but per outpatient anticoag notes (3/12) appears patient was taking 3.75mg  on Fri and 2.5mg  all other days. Pharmacy consulted to dose heparin once INR < 2.0 per cardiology.   Goal of Therapy:  Heparin level 0.3-0.7 units/ml Monitor platelets by anticoagulation protocol: Yes   Plan:  Hold heparin for today.  Daily INR, start heparin once INR < 2.0.  Esmeralda Arthur, PharmD, BCCCP  11/25/2022,11:49 AM

## 2022-11-25 NOTE — Assessment & Plan Note (Addendum)
Continue rate control with carvedilol. Transition to heparin drip while cardiac workup.

## 2022-11-25 NOTE — H&P (Addendum)
Cardiology Admission History and Physical   Patient ID: [name] MRN: [id number]; DOB: 07-12-1941   Admission date: 11/25/2022  PCP:  Binnie Rail, MD   [place] [facility] Cardiologist:  Loralie Champagne, MD  Electrophysiologist:  Constance Haw, MD       Chief Complaint: Weakness, elevated trop  Patient Profile:   Anita Duke is a 82 y.o. female with breast cancer post lumpectomy , radioactive seed placement and 3 different surgeries, the last one 07/04/2022, coronary artery disease with remote CABG with LIMA-LAD 2001 (LIMA later atretic) and mitral valve repair, diabetes, hypertension, hyperlipidemia, chronic systolic heart failure, COPD.   She has a history of Maze procedure but had more frequent episodes of atrial fibrillation and is now considered to be in permanent afib, status post AV node ablation and [facility] Jude dual-chamber pacemaker, upgraded to a BiV device in 2015.  She had a generator change on 06/22/2022.  She is being seen 11/25/2022 for the evaluation of NSTEMI.  History of Present Illness:   Ms. Garofano was seen by Dr Curt Bears 10/02/2022.  Her device was functioning appropriately, EF 45-50% without volume overload and not having any anginal symptoms.  Her weight was 142 pounds.  She is on warfarin.  On 3/12, she had an INR check and her INR was low at 1.6.  She takes 3.75 mg on Friday and 2.5 mg all other days.  About 3 days ago she developed a little cough.  She also has noticed in the last week or so that she was tired and did not feel inclined to do anything extra.  She was able to do her normal activities including walking her dog 4 times a day and taking food to a friend.  She did not get any chest pain.  She developed a little cough, increased dyspnea on exertion, and then felt even more reluctant to be active around the house.  She did not notice any lower extremity edema, no weight change, no orthopnea or PND.  She  did not have any palpitations.  She did not have presyncope or syncope.  There was no indication that her pacemaker function was abnormal.  Over the last day or so, she has been noticing increasing weakness.  During the night, she got up to go to the bathroom and her legs collapsed, they would not hold her.  She was able to get back to bed.  This a.m., she got up and the same thing happened.  That is when she decided it was time to come to the hospital.  In the emergency room she is resting comfortably.  She has never had any chest pain.   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   Breast cancer ([place])    CAD (coronary artery disease)    LIMA to the LAD at time of mitral valve repair / LIMA atretic,, February, 2011   Cataracts, bilateral    removed bilat    Cecal cancer 2014   colon   CHF (congestive heart failure)    Chronic systolic dysfunction of left ventricle 05/21/2013   Clotting  disorder    21 yrs ago blood clot in heart    Colon cancer (HCC)    Colon polyp, hyperplastic    Diabetes mellitus type II, controlled 03/02/2009   Diabetic neuropathy 02/21/2018   [facility] - [facility] [place]   Diverticulosis of colon    Diverticulosis of large intestine 08/04/2002   Ejection fraction < 50%    EF 30%, New diagnosis   february, 2011, etiology not clear, consider rate related tachycardia, and not use carvedilol, catheterization no constriction, /    EF 55-60% echo, June, 2011 /     Beta blocker stopped May, 2012 with reactive airway disease.  Patient does not tolerate metoprolol.  Carvedilol stopped  /   EF 55%, echo, February, 2012  //   EF 40-45%, septal dyssynergy, hypokinesis of the  a   Essential hypertension 07/07/2008   Family history of adverse reaction to anesthesia    sister had a hard time waking up after anesthesia   Family history of colon cancer 04/29/2021   GERD (gastroesophageal reflux disease)    Heart murmur    Hemorrhoids, internal    High cholesterol    History of CABG    Hyperlipidemia 07/07/2008   Lumbar radiculopathy, acute 06/17/2018   Major depressive disorder 07/19/2018   Mitral regurgitation    a. s/p repair with LAA ligation and MAZE at time of surgery   Mitral stenosis    Mild, February, 2011, post mitral valve repair / mild, echo, June, 2011  //  Mild functional mitral stenosis, echo, February, 2012  //   mild stenosis, echo, December, 2013  //  mild, echo, August, 2014   //   Mild, echo, April, 2016    Osteopenia    left hip    Pacemaker-Medtronic 05/20/2012   Pacemaker placed September, 2013   //   upgraded to biventricular CRT pacemaker November 04, 2013    Permanent atrial fibrillation    a. s/p MAZE b. s/p AVN ablation   Personal history of colonic polyps 04/29/2021   PONV (postoperative nausea and vomiting)    Pulmonary HTN    QT prolongation    Tikosyn and Effexor. QT prolonged October 13, 2011, peak is in dose reduced from 500  to -250 twice a day   Right ventricular dysfunction    Mild to moderate, echo, February, 2011 / normalized echo, June, 2011 /  RV normal, echo, February, 2012  //   right ventricle reported as good echo, December, 2013    S/P mitral valve repair    [facility] / Maze procedure/ atrial appendage removed were tied off    Sick sinus syndrome    s/p Medtronic dual chamber PPM 05/17/12    Sleep difficulties 09/13/2019   Vitamin D deficiency 02/20/2014    Past Surgical History:  Procedure Laterality Date   ABDOMINAL HYSTERECTOMY  ?2002   AV NODE ABLATION N/A 05/28/2012   Procedure: AV NODE ABLATION;  Surgeon: Thompson Grayer, MD;  Location: [facility] CATH LAB;  Service: Cardiovascular;  Laterality: N/A;    BI-VENTRICULAR PACEMAKER UPGRADE N/A 11/04/2013   upgrade of previously implanted dual chamber pacemaker to STJ Quadra Allure CRTP by Dr Rayann Heman   BIV PACEMAKER GENERATOR CHANGEOUT N/A 06/22/2022   Procedure: BIV PACEMAKER GENERATOR CHANGEOUT;  Surgeon: Constance Haw, MD;  Location: [place] CV LAB;  Service: Cardiovascular;  Laterality: N/A;   BREAST EXCISIONAL BIOPSY Left over 10 years ago   benign   BREAST LUMPECTOMY Left 05/26/2021  Procedure: LEFT BREAST LUMPECTOMY;  Surgeon: Stark Klein, MD;  Location: [place];  Service: General;  Laterality: Left;   BREAST SURGERY     CARDIOVERSION  02/08/2012   Procedure: CARDIOVERSION;  Surgeon: Carlena Bjornstad, MD;  Location: [place];  Service: Cardiovascular;  Laterality: N/A;   CARDIOVERSION  02/29/2012   Procedure: CARDIOVERSION;  Surgeon: Carlena Bjornstad, MD;  Location: [place];  Service: Cardiovascular;  Laterality: N/A;   CARDIOVERSION  05/15/2012   Procedure: CARDIOVERSION;  Surgeon: Carlena Bjornstad, MD;  Location: [place];  Service: Cardiovascular;  Laterality: N/A;   CATARACT EXTRACTION, BILATERAL  2017   [place]   COLON SURGERY  2014   right hemicolectomy    COLONOSCOPY     CORONARY ARTERY BYPASS GRAFT  2001   CABG X1 "at time of mitral valve repair" (12/04/2012   DIAGNOSTIC LAPAROSCOPIC LIVER BIOPSY Left 12/03/2012   Procedure: DIAGNOSTIC LAPAROSCOPIC LIVER BIOPSY;  Surgeon: Stark Klein, MD;  Location: [place];  Service: General;  Laterality: Left;   EYE SURGERY     bilateral cataract surgery   LAPAROSCOPIC RIGHT HEMI COLECTOMY  12/03/2012   Procedure: LAPAROSCOPIC RIGHT HEMI COLECTOMY;  Surgeon: Stark Klein, MD;  Location: [place];  Service: General;;   LUMBAR LAMINECTOMY/ DECOMPRESSION WITH MET-RX Left 06/17/2018   Procedure: left Lumbar three-fourextraforaminal Microdiscectomy with Met-Rx;  Surgeon: Kristeen Miss, MD;  Location: [place];  Service: Neurosurgery;  Laterality: Left;   MAZE  2001   w/ MVR & CABG    MITRAL VALVE REPAIR  2001   "anterior and posterior leaflets" (12/04/2012)   PERMANENT PACEMAKER INSERTION N/A 05/17/2012   MDT Adapta L implanted by Dr Rayann Heman for tachy/brady syndrome   POLYPECTOMY     RADIOACTIVE SEED GUIDED EXCISIONAL BREAST BIOPSY Right 07/04/2022   Procedure: RADIOACTIVE SEED GUIDED EXCISIONAL RIGHT BREAST BIOPSY;  Surgeon: Stark Klein, MD;  Location: [place];  Service: General;  Laterality: Right;   TONSILLECTOMY AND ADENOIDECTOMY  ~ [id number]   UMBILICAL HERNIA REPAIR N/A 12/03/2012   Procedure: HERNIA REPAIR UMBILICAL ADULT;  Surgeon: Stark Klein, MD;  Location: [place];  Service: General;  Laterality: N/A;     Medications Prior to Admission: Prior to Admission medications   Medication Sig Start Date End Date Taking? Authorizing Provider  amoxicillin (AMOXIL) 500 MG capsule TAKE 4 CAPSULES PRIOR TO DENTAL APPOINTMENTS AS DIRECTED 10/13/22 10/13/23  Binnie Rail, MD  atorvastatin (LIPITOR) 40 MG tablet Take 1 tablet (40 mg total) by mouth daily. 03/16/22   Bensimhon, Shaune Pascal, MD  carvedilol (COREG) 12.5 MG tablet Take 1 tablet (12.5 mg total) by mouth 2 (two) times daily with a meal. 03/16/22   Bensimhon, Shaune Pascal, MD  cholecalciferol (VITAMIN D) 1000 UNITS tablet Take 1,000 Units by mouth daily.     [provider]  cyanocobalamin (VITAMIN B12) 500 MCG tablet Take 500 mcg by mouth daily.    [provider]  empagliflozin (JARDIANCE) 25 MG TABS tablet Take 1 tablet (25 mg total) by mouth daily. 08/10/22   Binnie Rail, MD  ENTRESTO 24-26 MG TAKE 1 TABLET TWICE A DAY 11/17/22   Larey Dresser, MD  fish oil-omega-3 fatty acids 1000 MG capsule Take 1 g by mouth 2 (two) times daily.    [provider]  loratadine (CLARITIN) 10 MG tablet Take 10 mg by mouth daily.    [provider]  Polyethyl Glycol-Propyl Glycol (SYSTANE) 0.4-0.3 % SOLN Place 1 drop into both eyes daily.  [provider]  sodium chloride (OCEAN) 0.65 % SOLN nasal  spray Place 1 spray into both nostrils 2 (two) times daily.    [provider]  spironolactone (ALDACTONE) 25 MG tablet Take 1 tablet (25 mg total) by mouth daily. 03/16/22   Bensimhon, Shaune Pascal, MD  tamoxifen (NOLVADEX) 20 MG tablet Take 1 tablet (20 mg total) by mouth daily. 07/13/22   Alla Feeling, NP  venlafaxine XR (EFFEXOR-XR) 37.5 MG 24 hr capsule Take 1 capsule (37.5 mg total) by mouth daily. 07/13/22   Alla Feeling, NP  warfarin (COUMADIN) 2.5 MG tablet TAKE 1 TABLET BY MOUTH DAILY OR AS DIRECTED BY ANTICOAGULATION  CLINIC Patient taking differently: Take 2.5 mg by mouth every evening.  OR AS DIRECTED BY ANTICOAGULATION  CLINIC 03/29/22   Binnie Rail, MD     Allergies:    Allergies  Allergen Reactions   Demerol [Meperidine] Nausea And Vomiting   Morphine Nausea And Vomiting   Januvia [Sitagliptin] Other (See Comments)    Headaches, did not feel well   Metformin And Related Diarrhea   Tetanus Toxoid Rash and Other (See Comments)    "years ago"    Social History:   Social History   Socioeconomic History   Marital status: Widowed    Spouse name: Not on file   Number of children: 0   Years of education: 12   Highest education level: High school graduate  Occupational History   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: [employer]   Tobacco Use   Smoking status: Never   Smokeless tobacco: Never  Vaping Use   Vaping Use: Never used  Substance and Sexual Activity   Alcohol use: No   Drug use: No   Sexual activity: Not Currently    Birth control/protection: Surgical  Other Topics Concern   Not on file  Social History Narrative   No caffeine   Right handed    Lives alone    Social Determinants of Health   Financial Resource Strain: Low Risk  (06/26/2022)   Overall Financial Resource Strain (CARDIA)    Difficulty of Paying Living Expenses: Not hard at all  Food Insecurity: No Food  Insecurity (06/26/2022)   Hunger Vital Sign    Worried About Running Out of Food in the Last Year: Never true    Ran Out of Food in the Last Year: Never true  Transportation Needs: No Transportation Needs (06/26/2022)   PRAPARE - Hydrologist (Medical): No    Lack of Transportation (Non-Medical): No  Physical Activity: Sufficiently Active (06/26/2022)   Exercise Vital Sign    Days of Exercise per Week: 7 days    Minutes of Exercise per Session: 30 min  Stress: No Stress Concern Present (06/26/2022)   [place]    Feeling of Stress : Not at all  Social Connections: [place] (06/26/2022)   Social Connection and Isolation Panel [NHANES]    Frequency of Communication with Friends and Family: More than three times a week    Frequency of Social Gatherings with Friends and Family: More than three times a week    Attends Religious Services: More than 4 times per year    Active Member of [employer] or Organizations: Yes    Attends Archivist Meetings: More than 4 times per year  Marital Status: Married  Human resources officer Violence: Not At Risk (06/26/2022)   Humiliation, Afraid, Rape, and Kick questionnaire    Fear of Current or Ex-Partner: No    Emotionally Abused: No    Physically Abused: No    Sexually Abused: No    Family History:   The patient's family history includes Alzheimer's disease in her mother; Brain cancer (age of onset: 47) in her nephew; Cancer in her maternal grandfather; Colon cancer in her cousin, maternal aunt, and maternal aunt; Depression in her mother; Diabetes in her father, mother, paternal grandfather, and paternal grandmother; Heart disease in her father, paternal uncle, and sister; Kidney disease in her mother; Thyroid disease in her mother. There is no history of Colon polyps, Adrenal disorder, Esophageal cancer, Rectal cancer, or Stomach cancer.    ROS:   Please see the history of present illness.  All other ROS reviewed and negative.     Physical Exam/Data:   Vitals:   11/25/22 0900 11/25/22 0915 11/25/22 1122 11/25/22 1130  BP: (!) 112/57 121/60    Pulse: 75 70 70 69  Resp: 19 19 17 17   Temp:      SpO2: 99% 99% 99% 99%   No intake or output data in the 24 hours ending 11/25/22 1148    10/02/2022    1:54 PM 10/02/2022    8:54 AM 09/07/2022   11:13 AM  Last 3 Weights  Weight (lbs) 142 lb 143 lb 144 lb 6.4 oz  Weight (kg) 64.411 kg 64.864 kg 65.499 kg     There is no height or weight on file to calculate BMI.  General:  Well nourished, well developed, in no acute distress HEENT: normal for age Neck: no JVD Vascular: No carotid bruits; Distal pulses 2+ bilaterally   Cardiac:  normal S1, S2; RRR; soft murmur  Lungs:  clear to auscultation bilaterally, no wheezing, rhonchi or rales  Abd: soft, nontender, no hepatomegaly  Ext: no edema Musculoskeletal:  No deformities, BUE and BLE strength normal and equal Skin: warm and dry  Neuro:  CNs 2-12 intact, no focal abnormalities noted Psych:  Normal affect    EKG:  The ECG that was done today was personally reviewed and demonstrates underlying atrial fibrillation with biventricular pacing and a heart rate of 70  Relevant CV Studies:  ECHO: 09/02/2021  1. Left ventricular ejection fraction, by estimation, is 45 to 50%. The left ventricle has mildly decreased function. The left ventricle  demonstrates global hypokinesis. There is mild left ventricular  hypertrophy. Left ventricular diastolic parameters are indeterminate.   2. Right ventricular systolic function is mildly reduced. The right  ventricular size is normal. There is normal pulmonary artery systolic pressure. The estimated right ventricular systolic pressure is [id number] mmHg.   3. Left atrial size was severely dilated.   4. Right atrial size was mildly dilated.   5. S/p mitral valve repair. Trivial mitral regurgitation. No  significant stenosis with mean gradient 2 mmHg.   6. The aortic valve is tricuspid. Aortic valve regurgitation is not  visualized. No aortic stenosis is present.   7. The inferior vena cava is normal in size with greater than 50%  respiratory variability, suggesting right atrial pressure of 3 mmHg.   R/L HEART CATH: [id number] Findings:   RA = 4 RV =  31/2/5 PA =  33/9 (19) PCW = 8 Fick cardiac output/index = 4.8/2.6 Thermo CO/CI = 3.3/1.8 PVR = 1.5 [place] SVR = 1,330 FA sat = 92% PA  sat = 66%, 67% MV gradient = 7.5 mm Hg MVA = 1.9 cm2   Ao Pressure: 108/46 (69) LV Pressure: 109/4/7 There was no signficant gradient across the aortic valve on pullback.   Left main: Normal   LAD: Large vessel gives off large first diagonal. In proximal to mid LAD at bifurcation with the diagonal there is 50-60% lesion. Otherwise normal   LCX: Large vessel. normal   RCA: Large dominant vessel. normal   LV-gram done in the RAO projection: Ejection fraction = 25% with significant dyssynchrony. No obvious MR   LIMA to LAD: atretic   Assessment: 1. Moderate 1v CAD 2. Severe LV dysfunction with evidence of dyssynchrony 3. Normal filling pressures without PAH 4. Mild to moderate mitral stenosis  Laboratory Data:  High Sensitivity Troponin:   Recent Labs  Lab 11/25/22 0900  TROPONINIHS 3,029*      Chemistry Recent Labs  Lab 11/25/22 0900 11/25/22 0929  NA 137 139  K 3.7 4.4  CL 105 108  CO2 17*  --   GLUCOSE 243* 240*  BUN 20 30*  CREATININE 0.88 0.60  CALCIUM 8.7*  --   GFRNONAA >60  --   ANIONGAP 15  --     Recent Labs  Lab 11/25/22 0900  PROT 6.7  ALBUMIN 3.9  AST 66*  ALT 34  ALKPHOS 55  BILITOT 1.5*   Lipids No results for input(s): "CHOL", "TRIG", "HDL", "LABVLDL", "LDLCALC", "CHOLHDL" in the last 168 hours. Hematology Recent Labs  Lab 11/25/22 0900 11/25/22 0929  WBC 6.5  --   RBC 5.26*  --   HGB 16.2* 16.7*  HCT 49.3* 49.0*  MCV 93.7  --   MCH 30.8   --   MCHC 32.9  --   RDW 13.4  --   PLT 204  --    Lab Results  Component Value Date   INR 1.6 (A) 11/14/2022   INR 2.9 10/24/2022   INR 2.0 09/26/2022     Thyroid No results for input(s): "TSH", "FREET4" in the last 168 hours. BNPNo results for input(s): "BNP", "PROBNP" in the last 168 hours.  DDimer No results for input(s): "DDIMER" in the last 168 hours.   Radiology/Studies:  DG Chest 2 View  Result Date: 11/25/2022 CLINICAL DATA:  Table formatting from the original note was not included. Images from the original note were not included. Pt reports she has been feeling weak x yesterday. Pt reports she has not been able to walk due to weakness. EXAM: CHEST - 2 VIEW COMPARISON:  11/05/2013 FINDINGS: Focal right mid lung airspace disease. Stable left subclavian transvenous pacemaker. Stable cardiomegaly.  Aortic Atherosclerosis (ICD10-170.0). No effusion. Sternotomy wires. IMPRESSION: 1. Focal right mid lung airspace disease. 2. Stable cardiomegaly. Electronically Signed   By: Lucrezia Europe M.D.   On: 11/25/2022 09:44     Assessment and Plan:   Non-STEMI -Atypical presentation with weakness and increased dyspnea on exertion -Initial troponin 3029, repeat pending -Her INR was subtherapeutic on 3/12, repeat pending -Hold Coumadin, start heparin when INR is less than 2.0 -No real indication for IV nitroglycerin since she has not had chest pain and is not currently short of breath. -No explanation for the weakness other than cardiac since her hemoglobin is not low and renal function is normal, therefore she is not bleeding and is not dehydrated.   2.  Pacemaker dependent with underlying atrial fibrillation s/p Saint Jude BiV pacemaker with GEN change 06/2022 -Last interrogation was in January, MD  advise if this needs to be repeated  3.  Chronic systolic CHF: -EF was [id number] in 2016, felt NICM, improved to 60% in 2018 but most recent echo shows EF 45-50% -Recheck echo, continue current  medications, monitor volume  4. S/p bioprosthetic valve, mitral valve repair -She has been on warfarin since then, was offered Eliquis at 1 point but prefers to continue warfarin since she is stable on this.   Risk Assessment/Risk Scores:    TIMI Risk Score for Unstable Angina or Non-ST Elevation MI:   The patient's TIMI risk score is 4, which indicates a 20% risk of all cause mortality, new or recurrent myocardial infarction or need for urgent revascularization in the next 14 days.  New York Heart Association (NYHA) Functional Class NYHA Class II  CHA2DS2-VASc Score = 7   This indicates a 11.2% annual risk of stroke. The patient's score is based upon: CHF History: 1 HTN History: 1 Diabetes History: 1 Stroke History: 0 Vascular Disease History: 1 Age Score: 2 Gender Score: 1   For questions or updates, please contact [place] Please consult [email or web address] for contact info under     Signed, Rosaria Ferries, PA-C  11/25/2022 11:48 AM   History and all data above reviewed.  Patient examined.  I agree with the findings as above.    Of note the EKG demonstrates ventricular pacing 100% capture.  Interestingly there is deep T wave inversions in the anterolateral leads that were not present with her 100% capture ventricular paced beats on a previous EKG.  She had been doing OK until this week when she started to have cough and some SOB that she thought was related to allergies.  It was more significant yesterday.  She also developed leg weakness and nearly fell.  This is really the symptom that brought her to the ED.  She otherwise had been doing well.  She walks her dog 4 x per day.   She lives alone in a retirement community.  She has no family in town.  The patient denies any new symptoms such as chest discomfort, neck or arm discomfort. There has been no new PND or orthopnea. There have been no reported palpitations, presyncope or syncope.  The patient exam reveals COR:RRR  ,   Lungs: Clear  ,  Abd: Positive bowel sounds, no rebound no guarding, Ext No edema  .  All available labs, radiology testing, previous records reviewed. Agree with documented assessment and plan.  Elevated troponin: This was found incidentally in conjunction with symptoms more consistent with a URI and abnormalities on chest x-ray suggestive of that.  EKG is essentially uninterpretable because of the paced rhythm although there is some T wave change that is different compared to previous.  At this point I think it start with repeat echocardiogram to look for new wall motion abnormalities.  We can continue to cycle enzymes.  Think it is reasonable to hold her Coumadin pending further evaluation although not suggesting cardiac catheterization at this point.  Atrial fibrillation: She will be on heparin in place of Coumadin.    Jeneen Rinks Rahcel Shutes  12:32 PM  11/25/2022

## 2022-11-25 NOTE — ED Triage Notes (Signed)
Pt BIB [facility] from home d/t SOB and weakness that started yesterday. Pt reported being tired walking from just living room and bathroom. Productive cough. Lung sounds clear w EMS. Pt has a pacemaker , is a diabetic. Compliant w her meds. A&O X4. VSS.

## 2022-11-25 NOTE — ED Provider Notes (Signed)
[facility] Provider Note   CSN: [id number] Arrival date & time: 11/25/22  [id number]     History  Chief Complaint  Patient presents with   Weakness   Shortness of Breath    Anita Duke is a 82 y.o. female.  Patient is a 82 year old female who presents with cough and weakness.  She has had a 3 to 4-day history of some runny nose which she attributed to allergies and coughing.  Her cough is somewhat productive.  She has had a little bit of associated shortness of breath.  Since yesterday afternoon she has felt profoundly weak.  She is having a hard time ambulating due to the weakness.  She says her legs just give out on her because she feels so fatigued and weak.  She denies any known fevers.  No abdominal pain.  No vomiting or diarrhea.  No urinary symptoms.  No melena or bright red blood per rectum.       Home Medications Prior to Admission medications   Medication Sig Start Date End Date Taking? Authorizing Provider  amoxicillin (AMOXIL) 500 MG capsule TAKE 4 CAPSULES PRIOR TO DENTAL APPOINTMENTS AS DIRECTED 10/13/22 10/13/23  Binnie Rail, MD  atorvastatin (LIPITOR) 40 MG tablet Take 1 tablet (40 mg total) by mouth daily. 03/16/22   Bensimhon, Shaune Pascal, MD  carvedilol (COREG) 12.5 MG tablet Take 1 tablet (12.5 mg total) by mouth 2 (two) times daily with a meal. 03/16/22   Bensimhon, Shaune Pascal, MD  cholecalciferol (VITAMIN D) 1000 UNITS tablet Take 1,000 Units by mouth daily.     [provider]  cyanocobalamin (VITAMIN B12) 500 MCG tablet Take 500 mcg by mouth daily.    [provider]  empagliflozin (JARDIANCE) 25 MG TABS tablet Take 1 tablet (25 mg total) by mouth daily. 08/10/22   Binnie Rail, MD  ENTRESTO 24-26 MG TAKE 1 TABLET TWICE A DAY 11/17/22   Larey Dresser, MD  fish oil-omega-3 fatty acids 1000 MG capsule Take 1 g by mouth 2 (two) times daily.    [provider]  loratadine (CLARITIN) 10 MG  tablet Take 10 mg by mouth daily.    [provider]  Polyethyl Glycol-Propyl Glycol (SYSTANE) 0.4-0.3 % SOLN Place 1 drop into both eyes daily.    [provider]  sodium chloride (OCEAN) 0.65 % SOLN nasal spray Place 1 spray into both nostrils 2 (two) times daily.    [provider]  spironolactone (ALDACTONE) 25 MG tablet Take 1 tablet (25 mg total) by mouth daily. 03/16/22   Bensimhon, Shaune Pascal, MD  tamoxifen (NOLVADEX) 20 MG tablet Take 1 tablet (20 mg total) by mouth daily. 07/13/22   Alla Feeling, NP  venlafaxine XR (EFFEXOR-XR) 37.5 MG 24 hr capsule Take 1 capsule (37.5 mg total) by mouth daily. 07/13/22   Alla Feeling, NP  warfarin (COUMADIN) 2.5 MG tablet TAKE 1 TABLET BY MOUTH DAILY OR AS DIRECTED BY ANTICOAGULATION  CLINIC Patient taking differently: Take 2.5 mg by mouth every evening.  OR AS DIRECTED BY ANTICOAGULATION  CLINIC 03/29/22   Binnie Rail, MD      Allergies    Demerol [meperidine], Morphine, Januvia [sitagliptin], Metformin and related, and Tetanus toxoid    Review of Systems   Review of Systems  Constitutional:  Positive for fatigue. Negative for chills, diaphoresis and fever.  HENT:  Positive for congestion and rhinorrhea. Negative for sneezing.   Eyes:  Negative.   Respiratory:  Positive for cough and shortness of breath. Negative for chest tightness.   Cardiovascular:  Negative for chest pain and leg swelling.  Gastrointestinal:  Negative for abdominal pain, blood in stool, diarrhea, nausea and vomiting.  Genitourinary:  Negative for difficulty urinating, flank pain, frequency and hematuria.  Musculoskeletal:  Negative for arthralgias and back pain.  Skin:  Negative for rash.  Neurological:  Positive for weakness (Generalized). Negative for dizziness, speech difficulty, numbness and headaches.    Physical Exam Updated Vital Signs BP 133/67 (BP Location: Right Arm)   Pulse 78   Temp 97.7 F (36.5 C) (Oral)   Resp 18   SpO2  97%  Physical Exam Constitutional:      Appearance: She is well-developed.  HENT:     Head: Normocephalic and atraumatic.  Eyes:     Pupils: Pupils are equal, round, and reactive to light.  Cardiovascular:     Rate and Rhythm: Normal rate and regular rhythm.     Heart sounds: Normal heart sounds.  Pulmonary:     Effort: Pulmonary effort is normal. No respiratory distress.     Breath sounds: Normal breath sounds. No wheezing or rales.  Chest:     Chest wall: No tenderness.  Abdominal:     General: Bowel sounds are normal.     Palpations: Abdomen is soft.     Tenderness: There is no abdominal tenderness. There is no guarding or rebound.  Musculoskeletal:        General: Normal range of motion.     Cervical back: Normal range of motion and neck supple.  Lymphadenopathy:     Cervical: No cervical adenopathy.  Skin:    General: Skin is warm and dry.     Findings: No rash.  Neurological:     General: No focal deficit present.     Mental Status: She is alert and oriented to person, place, and time.     ED Results / Procedures / Treatments   Labs (all labs ordered are listed, but only abnormal results are displayed) Labs Reviewed  COMPREHENSIVE METABOLIC PANEL - Abnormal; Notable for the following components:      Result Value   CO2 17 (*)    Glucose, Bld 243 (*)    Calcium 8.7 (*)    AST 66 (*)    Total Bilirubin 1.5 (*)    All other components within normal limits  CBC WITH DIFFERENTIAL/PLATELET - Abnormal; Notable for the following components:   RBC 5.26 (*)    Hemoglobin 16.2 (*)    HCT 49.3 (*)    All other components within normal limits  PROTIME-INR - Abnormal; Notable for the following components:   Prothrombin Time 25.9 (*)    INR 2.4 (*)    All other components within normal limits  I-STAT CHEM 8, ED - Abnormal; Notable for the following components:   BUN 30 (*)    Glucose, Bld 240 (*)    Calcium, Ion 0.96 (*)    TCO2 18 (*)    Hemoglobin 16.7 (*)    HCT  49.0 (*)    All other components within normal limits  TROPONIN I (HIGH SENSITIVITY) - Abnormal; Notable for the following components:   Troponin I (High Sensitivity) 3,029 (*)    All other components within normal limits  TROPONIN I (HIGH SENSITIVITY) - Abnormal; Notable for the following components:   Troponin I (High Sensitivity) 2,705 (*)    All other components within normal  limits  RESP PANEL BY RT-PCR (RSV, FLU A&B, COVID)  [id number]    EKG EKG Interpretation  Date/Time:  Saturday November 25 2022 08:55:19 EDT Ventricular Rate:  70 PR Interval:    QRS Duration: 176 QT Interval:  576 QTC Calculation: 622 R Axis:   234 Text Interpretation: VENTRICULAR PACED RHYTHM Confirmed by Malvin Johns [phone]) on 11/25/2022 10:40:05 AM  Radiology DG Chest 2 View  Result Date: 11/25/2022 CLINICAL DATA:  Table formatting from the original note was not included. Images from the original note were not included. Pt reports she has been feeling weak x yesterday. Pt reports she has not been able to walk due to weakness. EXAM: CHEST - 2 VIEW COMPARISON:  11/05/2013 FINDINGS: Focal right mid lung airspace disease. Stable left subclavian transvenous pacemaker. Stable cardiomegaly.  Aortic Atherosclerosis (ICD10-170.0). No effusion. Sternotomy wires. IMPRESSION: 1. Focal right mid lung airspace disease. 2. Stable cardiomegaly. Electronically Signed   By: Lucrezia Europe M.D.   On: 11/25/2022 09:44    Procedures Procedures    Medications Ordered in ED Medications  tamoxifen (NOLVADEX) tablet 20 mg (has no administration in time range)  atorvastatin (LIPITOR) tablet 40 mg (has no administration in time range)  carvedilol (COREG) tablet 12.5 mg (has no administration in time range)  sacubitril-valsartan (ENTRESTO) 24-26 mg per tablet (has no administration in time range)  spironolactone (ALDACTONE) tablet 25 mg (has no administration in time range)  venlafaxine XR (EFFEXOR-XR) 24 hr capsule 37.5 mg (has no  administration in time range)  empagliflozin (JARDIANCE) tablet 25 mg (has no administration in time range)  cyanocobalamin (VITAMIN B12) tablet 500 mcg (has no administration in time range)  cholecalciferol (VITAMIN D3) 25 MCG (1000 UNIT) tablet 1,000 Units (has no administration in time range)  fish oil-omega-3 fatty acids capsule 1 g (has no administration in time range)  acetaminophen (TYLENOL) tablet 650 mg (has no administration in time range)    Or  acetaminophen (TYLENOL) suppository 650 mg (has no administration in time range)  ondansetron (ZOFRAN) tablet 4 mg (has no administration in time range)    Or  ondansetron (ZOFRAN) injection 4 mg (has no administration in time range)  lactated ringers bolus 500 mL (0 mLs Intravenous Stopped 11/25/22 1123)  cefTRIAXone (ROCEPHIN) 1 g in sodium chloride 0.9 % 100 mL IVPB (0 g Intravenous Stopped 11/25/22 1123)  azithromycin (ZITHROMAX) 500 mg in sodium chloride 0.9 % 250 mL IVPB (0 mg Intravenous Stopped 11/25/22 1233)    ED Course/ Medical Decision Making/ A&P                             Medical Decision Making Amount and/or Complexity of Data Reviewed Labs: ordered. Radiology: ordered.  Risk Decision regarding hospitalization.   Patient is a 82 year old female who presents with some upper respiratory symptoms of cough and runny nose.  She did have some associated shortness of breath but no associated chest pain.  Chest x-ray was performed which was shown to have a right-sided pneumonia.  This was interpreted by me and confirmed by the radiologist.  She was started on IV antibiotics.  She did not have suggestions of sepsis.  Given the shortness of breath, troponin was ordered which was markedly elevated over 3000.  Her EKG shows a paced rhythm which is difficult to determine underlying changes.  She again denies any chest tightness or discomfort.  I did speak with Dr. Percival Spanish with cardiology who will consult  on the patient.  She is on  Coumadin.  Will check an INR prior to starting anticoagulants.  Patient will need to be admitted for further treatment.  I spoke with Dr. Cathlean Sauer with the hospitalist service to admit the patient for further treatment.  CRITICAL CARE Performed by: Malvin Johns Total critical care time: 60 minutes Critical care time was exclusive of separately billable procedures and treating other patients. Critical care was necessary to treat or prevent imminent or life-threatening deterioration. Critical care was time spent personally by me on the following activities: development of treatment plan with patient and/or surrogate as well as nursing, discussions with consultants, evaluation of patient's response to treatment, examination of patient, obtaining history from patient or surrogate, ordering and performing treatments and interventions, ordering and review of laboratory studies, ordering and review of radiographic studies, pulse oximetry and re-evaluation of patient's condition.   Final Clinical Impression(s) / ED Diagnoses Final diagnoses:  Community acquired pneumonia of right upper lobe of lung  NSTEMI (non-ST elevated myocardial infarction) [facility])    Rx / DC Orders ED Discharge Orders     None         Malvin Johns, MD 11/25/22 1421

## 2022-11-25 NOTE — Assessment & Plan Note (Signed)
Patient had revascularization in the past.  She is physically active at her baseline with no angina.  Follow up echocardiogram.  INR is 2,0  Patient may need coronary angiography on this hospitalization.

## 2022-11-25 NOTE — Assessment & Plan Note (Addendum)
NSTEMI High sensitive troponin is trending down.  Patient with no chest pain.  Positive for metapneumovirus.  03/24 Follow up chest radiograph personally reviewed with right upper lobe plate like atelectasis. Cardiomegaly with bilateral hilar congestion and cephalization of the vasculature.   Echocardiogram with no significant wall motion abnormalities, with preserved LV EF,.   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 There is no graft known to be attached to aorta but almost has appearance of coronary graft anastomosis. Concerning for ulcer. No contrast visualized within structure.  Acute on chronic diastolic heart failure, continue with carvedilol, entresto, spironolactone and SGLT 2 inh.  Patient received one dose of IV furosemide during her hospitalization.  Follow up with the heart failure clinic as outpatient.

## 2022-11-25 NOTE — ED Notes (Signed)
ED TO INPATIENT HANDOFF REPORT  ED Nurse Name and Phone #: Sherryll Burger [phone]  S Name/Age/Gender Anita Duke 82 y.o. female Room/Bed: 015C/015C  Code Status   Code Status: Full Code  Home/SNF/Other Home Patient oriented to: self, place, time, and situation Is this baseline? Yes   Triage Complete: Triage complete  Chief Complaint Pneumonia [J18.9]  Triage Note Pt BIB [facility] from home d/t SOB and weakness that started yesterday. Pt reported being tired walking from just living room and bathroom. Productive cough. Lung sounds clear w EMS. Pt has a pacemaker , is a diabetic. Compliant w her meds. A&O X4. VSS.    Allergies Allergies  Allergen Reactions   Demerol [Meperidine] Nausea And Vomiting   Morphine Nausea And Vomiting   Januvia [Sitagliptin] Other (See Comments)    Headaches, did not feel well   Metformin And Related Diarrhea   Tetanus Toxoid Rash and Other (See Comments)    "years ago"    Level of Care/Admitting Diagnosis ED Disposition     ED Disposition  Admit   Condition  --   Sebastian: [place] [id number]  Level of Care: Telemetry Cardiac [103]  May admit patient to [facility] or [facility] if equivalent level of care is available:: No  Covid Evaluation: Confirmed COVID Negative  Diagnosis: Pneumonia [227785]  Admitting Physician: Tawni Millers [id number]  Attending Physician: Tawni Millers [id number]  Certification:: I certify this patient will need inpatient services for at least 2 midnights  Estimated Length of Stay: 3          B Medical/Surgery History Past Medical History:  Diagnosis Date   Acquired complete AV block    AV node ablation May 28, 2012    Allergy    mild- uses claritin    Anemia    Hemoglobin 10.4, December, 2013   Arthritis    "not bad; little in my hands; some in my knees" (11/04/2013)   B12 deficiency 03/16/2020   Bilateral sensorineural hearing loss  03/29/2017   Breast cancer ([place])    CAD (coronary artery disease)    LIMA to the LAD at time of mitral valve repair / LIMA atretic,, February, 2011   Cataracts, bilateral    removed bilat    Cecal cancer 2014   colon   CHF (congestive heart failure)    Chronic systolic dysfunction of left ventricle 05/21/2013   Clotting disorder    21 yrs ago blood clot in heart    Colon cancer ([place])    Colon polyp, hyperplastic    Diabetes mellitus type II, controlled 03/02/2009   Diabetic neuropathy 02/21/2018   [facility] - [facility]   Diverticulosis of colon    Diverticulosis of large intestine 08/04/2002   Ejection fraction < 50%    EF 30%, New diagnosis   february, 2011, etiology not clear, consider rate related tachycardia, and not use carvedilol, catheterization no constriction, /    EF 55-60% echo, June, 2011 /     Beta blocker stopped May, 2012 with reactive airway disease.  Patient does not tolerate metoprolol.  Carvedilol stopped  /   EF 55%, echo, February, 2012  //   EF 40-45%, septal dyssynergy, hypokinesis of the a   Essential hypertension 07/07/2008   Family history of adverse reaction to anesthesia    sister had a hard time waking up after anesthesia   Family history of colon cancer 04/29/2021   GERD (gastroesophageal reflux  disease)    Heart murmur    Hemorrhoids, internal    High cholesterol    History of CABG    Hyperlipidemia 07/07/2008   Lumbar radiculopathy, acute 06/17/2018   Major depressive disorder 07/19/2018   Mitral regurgitation    a. s/p repair with LAA ligation and MAZE at time of surgery   Mitral stenosis    Mild, February, 2011, post mitral valve repair / mild, echo, June, 2011  //  Mild functional mitral stenosis, echo, February, 2012  //   mild stenosis, echo, December, 2013  //  mild, echo, August, 2014   //   Mild, echo, April, 2016    Osteopenia    left hip    Pacemaker-Medtronic 05/20/2012   Pacemaker placed September, 2013   //   upgraded to  biventricular CRT pacemaker November 04, 2013    Permanent atrial fibrillation    a. s/p MAZE b. s/p AVN ablation   Personal history of colonic polyps 04/29/2021   PONV (postoperative nausea and vomiting)    Pulmonary HTN    QT prolongation    Tikosyn and Effexor. QT prolonged October 13, 2011, peak is in dose reduced from 500  to -250 twice a day   Right ventricular dysfunction    Mild to moderate, echo, February, 2011 / normalized echo, June, 2011 /  RV normal, echo, February, 2012  //   right ventricle reported as good echo, December, 2013    S/P mitral valve repair    [facility] / Maze procedure/ atrial appendage removed were tied off    Sick sinus syndrome    s/p Medtronic dual chamber PPM 05/17/12    Sleep difficulties 09/13/2019   Vitamin D deficiency 02/20/2014   Past Surgical History:  Procedure Laterality Date   ABDOMINAL HYSTERECTOMY  ?2002   AV NODE ABLATION N/A 05/28/2012   Procedure: AV NODE ABLATION;  Surgeon: Thompson Grayer, MD;  Location: [facility] CATH LAB;  Service: Cardiovascular;  Laterality: N/A;   BI-VENTRICULAR PACEMAKER UPGRADE N/A 11/04/2013   upgrade of previously implanted dual chamber pacemaker to STJ Quadra Allure CRTP by Dr Rayann Heman   BIV PACEMAKER GENERATOR CHANGEOUT N/A 06/22/2022   Procedure: BIV PACEMAKER GENERATOR CHANGEOUT;  Surgeon: Constance Haw, MD;  Location: [place] CV LAB;  Service: Cardiovascular;  Laterality: N/A;   BREAST EXCISIONAL BIOPSY Left over 10 years ago   benign   BREAST LUMPECTOMY Left 05/26/2021   Procedure: LEFT BREAST LUMPECTOMY;  Surgeon: Stark Klein, MD;  Location: [place];  Service: General;  Laterality: Left;   BREAST SURGERY     CARDIOVERSION  02/08/2012   Procedure: CARDIOVERSION;  Surgeon: Carlena Bjornstad, MD;  Location: [place];  Service: Cardiovascular;  Laterality: N/A;   CARDIOVERSION  02/29/2012   Procedure: CARDIOVERSION;  Surgeon: Carlena Bjornstad, MD;  Location: [place];  Service: Cardiovascular;  Laterality: N/A;    CARDIOVERSION  05/15/2012   Procedure: CARDIOVERSION;  Surgeon: Carlena Bjornstad, MD;  Location: [facility] ENDOSCOPY;  Service: Cardiovascular;  Laterality: N/A;   CATARACT EXTRACTION, BILATERAL  2017   [place]   COLON SURGERY  2014   right hemicolectomy    COLONOSCOPY     CORONARY ARTERY BYPASS GRAFT  2001   CABG X1 "at time of mitral valve repair" (12/04/2012   DIAGNOSTIC LAPAROSCOPIC LIVER BIOPSY Left 12/03/2012   Procedure: DIAGNOSTIC LAPAROSCOPIC LIVER BIOPSY;  Surgeon: Stark Klein, MD;  Location: [place];  Service: General;  Laterality: Left;   EYE  SURGERY     bilateral cataract surgery   LAPAROSCOPIC RIGHT HEMI COLECTOMY  12/03/2012   Procedure: LAPAROSCOPIC RIGHT HEMI COLECTOMY;  Surgeon: Stark Klein, MD;  Location: [place];  Service: General;;   LUMBAR LAMINECTOMY/ DECOMPRESSION WITH MET-RX Left 06/17/2018   Procedure: left Lumbar three-fourextraforaminal Microdiscectomy with Met-Rx;  Surgeon: Kristeen Miss, MD;  Location: [place];  Service: Neurosurgery;  Laterality: Left;   MAZE  2001   w/ MVR & CABG   MITRAL VALVE REPAIR  2001   "anterior and posterior leaflets" (12/04/2012)   PERMANENT PACEMAKER INSERTION N/A 05/17/2012   MDT Adapta L implanted by Dr Rayann Heman for tachy/brady syndrome   POLYPECTOMY     RADIOACTIVE SEED GUIDED EXCISIONAL BREAST BIOPSY Right 07/04/2022   Procedure: RADIOACTIVE SEED GUIDED EXCISIONAL RIGHT BREAST BIOPSY;  Surgeon: Stark Klein, MD;  Location: [place];  Service: General;  Laterality: Right;   TONSILLECTOMY AND ADENOIDECTOMY  ~ [id number]   UMBILICAL HERNIA REPAIR N/A 12/03/2012   Procedure: HERNIA REPAIR UMBILICAL ADULT;  Surgeon: Stark Klein, MD;  Location: [place];  Service: General;  Laterality: N/A;     A IV Location/Drains/Wounds Patient Lines/Drains/Airways Status     Active Line/Drains/Airways     Name Placement date Placement time Site Days   Peripheral IV 07/04/22 20 G Left;Posterior Forearm 07/04/22  1109  Forearm  144   Peripheral IV  11/25/22 18 G Left Antecubital 11/25/22  --  Antecubital  less than 1            Intake/Output Last 24 hours No intake or output data in the 24 hours ending 11/25/22 1233  Labs/Imaging Results for orders placed or performed during the hospital encounter of 11/25/22 (from the past 48 hour(s))  Comprehensive metabolic panel     Status: Abnormal   Collection Time: 11/25/22  9:00 AM  Result Value Ref Range   Sodium 137 135 - 145 mmol/L   Potassium 3.7 3.5 - 5.1 mmol/L    Comment: HEMOLYSIS AT THIS LEVEL MAY AFFECT RESULT   Chloride 105 98 - 111 mmol/L   CO2 17 (L) 22 - 32 mmol/L   Glucose, Bld 243 (H) 70 - 99 mg/dL    Comment: Glucose reference range applies only to samples taken after fasting for at least 8 hours.   BUN 20 8 - 23 mg/dL   Creatinine, Ser 0.88 0.44 - 1.00 mg/dL   Calcium 8.7 (L) 8.9 - 10.3 mg/dL   Total Protein 6.7 6.5 - 8.1 g/dL   Albumin 3.9 3.5 - 5.0 g/dL   AST 66 (H) 15 - 41 U/L    Comment: HEMOLYSIS AT THIS LEVEL MAY AFFECT RESULT   ALT 34 0 - 44 U/L    Comment: HEMOLYSIS AT THIS LEVEL MAY AFFECT RESULT   Alkaline Phosphatase 55 38 - 126 U/L   Total Bilirubin 1.5 (H) 0.3 - 1.2 mg/dL    Comment: HEMOLYSIS AT THIS LEVEL MAY AFFECT RESULT   GFR, Estimated >60 >60 mL/min    Comment: (NOTE) Calculated using the CKD-EPI Creatinine Equation (2021)    Anion gap 15 5 - 15    Comment: Performed at [facility], [place] [address], [place], [place] [address]  CBC with Differential     Status: Abnormal   Collection Time: 11/25/22  9:00 AM  Result Value Ref Range   WBC 6.5 4.0 - 10.5 K/uL   RBC 5.26 (H) 3.87 - 5.11 MIL/uL   Hemoglobin 16.2 (H) 12.0 - 15.0 g/dL  HCT 49.3 (H) 36.0 - 46.0 %   MCV 93.7 80.0 - 100.0 fL   MCH 30.8 26.0 - 34.0 pg   MCHC 32.9 30.0 - 36.0 g/dL   RDW 13.4 11.5 - 15.5 %   Platelets 204 150 - 400 K/uL   nRBC 0.0 0.0 - 0.2 %   Neutrophils Relative % 67 %   Neutro Abs 4.4 1.7 - 7.7 K/uL   Lymphocytes Relative 21 %   Lymphs Abs 1.4  0.7 - 4.0 K/uL   Monocytes Relative 11 %   Monocytes Absolute 0.7 0.1 - 1.0 K/uL   Eosinophils Relative 0 %   Eosinophils Absolute 0.0 0.0 - 0.5 K/uL   Basophils Relative 0 %   Basophils Absolute 0.0 0.0 - 0.1 K/uL   Immature Granulocytes 1 %   Abs Immature Granulocytes 0.04 0.00 - 0.07 K/uL    Comment: Performed at [facility] [address] [place], [place] [address]  Troponin I (High Sensitivity)     Status: Abnormal   Collection Time: 11/25/22  9:00 AM  Result Value Ref Range   Troponin I (High Sensitivity) 3,029 (HH) <18 ng/L    Comment: CRITICAL RESULT CALLED TO, READ BACK BY AND VERIFIED WITH C,Dillyn Menna RN @1031  11/25/22 E,[place] (NOTE) Elevated high sensitivity troponin I (hsTnI) values and significant  changes across serial measurements may suggest ACS but many other  chronic and acute conditions are known to elevate hsTnI results.  Refer to the "Links" section for chest pain algorithms and additional  guidance. Performed at [facility], [place] [address], [place], [place] [address]   Resp panel by RT-PCR (RSV, Flu A&B, Covid) Anterior Nasal Swab     Status: None   Collection Time: 11/25/22  9:00 AM   Specimen: Anterior Nasal Swab  Result Value Ref Range   SARS Coronavirus 2 by RT PCR NEGATIVE NEGATIVE   Influenza A by PCR NEGATIVE NEGATIVE   Influenza B by PCR NEGATIVE NEGATIVE    Comment: (NOTE) The Xpert Xpress SARS-CoV-2/FLU/RSV plus assay is intended as an aid in the diagnosis of influenza from Nasopharyngeal swab specimens and should not be used as a sole basis for treatment. Nasal washings and aspirates are unacceptable for Xpert Xpress SARS-CoV-2/FLU/RSV testing.  Fact Sheet for Patients: EntrepreneurPulse.com.au  Fact Sheet for Healthcare Providers: IncredibleEmployment.be  This test is not yet approved or cleared by the [place] FDA and has been authorized for detection and/or diagnosis of SARS-CoV-2  by FDA under an Emergency Use Authorization (EUA). This EUA will remain in effect (meaning this test can be used) for the duration of the COVID-19 declaration under Section 564(b)(1) of the Act, 21 U.S.C. section 360bbb-3(b)(1), unless the authorization is terminated or revoked.     Resp Syncytial Virus by PCR NEGATIVE NEGATIVE    Comment: (NOTE) Fact Sheet for Patients: EntrepreneurPulse.com.au  Fact Sheet for Healthcare Providers: IncredibleEmployment.be  This test is not yet approved or cleared by the [place] FDA and has been authorized for detection and/or diagnosis of SARS-CoV-2 by FDA under an Emergency Use Authorization (EUA). This EUA will remain in effect (meaning this test can be used) for the duration of the COVID-19 declaration under Section 564(b)(1) of the Act, 21 U.S.C. section 360bbb-3(b)(1), unless the authorization is terminated or revoked.  Performed at [facility], [place] [address], [place], [place] [address]   I-stat chem 8, ED     Status: Abnormal   Collection Time: 11/25/22  9:29 AM  Result Value Ref Range   Sodium 139 135 - 145 mmol/L   Potassium 4.4 3.5 - 5.1 mmol/L   Chloride 108 98 - 111 mmol/L   BUN 30 (H) 8 - 23 mg/dL   Creatinine, Ser 0.60 0.44 - 1.00 mg/dL   Glucose, Bld 240 (H) 70 - 99 mg/dL    Comment: Glucose reference range applies only to samples taken after fasting for at least 8 hours.   Calcium, Ion 0.96 (L) 1.15 - 1.40 mmol/L   TCO2 18 (L) 22 - 32 mmol/L   Hemoglobin 16.7 (H) 12.0 - 15.0 g/dL   HCT 49.0 (H) 36.0 - 46.0 %  Protime-INR     Status: Abnormal   Collection Time: 11/25/22 11:25 AM  Result Value Ref Range   Prothrombin Time 25.9 (H) 11.4 - 15.2 seconds   INR 2.4 (H) 0.8 - 1.2    Comment: (NOTE) INR goal varies based on device and disease states. Performed at [facility], [place] [address] [place],  [address]    *Note: Due to a large number of results and/or  encounters for the requested time period, some results have not been displayed. A complete set of results can be found in Results Review.   DG Chest 2 View  Result Date: 11/25/2022 CLINICAL DATA:  Table formatting from the original note was not included. Images from the original note were not included. Pt reports she has been feeling weak x yesterday. Pt reports she has not been able to walk due to weakness. EXAM: CHEST - 2 VIEW COMPARISON:  11/05/2013 FINDINGS: Focal right mid lung airspace disease. Stable left subclavian transvenous pacemaker. Stable cardiomegaly.  Aortic Atherosclerosis (ICD10-170.0). No effusion. Sternotomy wires. IMPRESSION: 1. Focal right mid lung airspace disease. 2. Stable cardiomegaly. Electronically Signed   By: Lucrezia Europe M.D.   On: 11/25/2022 09:44    Pending Labs Unresulted Labs (From admission, onward)     Start     Ordered   11/26/22 0500  Protime-INR  Daily,   R      11/25/22 1210   Signed and Held  Basic metabolic panel  Tomorrow morning,   R        Signed and Held   Signed and Held  CBC  Tomorrow morning,   R        Signed and Held            Vitals/Pain Today's Vitals   11/25/22 0900 11/25/22 0915 11/25/22 1122 11/25/22 1130  BP: (!) 112/57 121/60    Pulse: 75 70 70 69  Resp: 19 19 17 17   Temp:      SpO2: 99% 99% 99% 99%    Isolation Precautions Airborne and Contact precautions  Medications Medications  lactated ringers bolus 500 mL (0 mLs Intravenous Stopped 11/25/22 1123)  cefTRIAXone (ROCEPHIN) 1 g in sodium chloride 0.9 % 100 mL IVPB (0 g Intravenous Stopped 11/25/22 1123)  azithromycin (ZITHROMAX) 500 mg in sodium chloride 0.9 % 250 mL IVPB (0 mg Intravenous Stopped 11/25/22 1233)    Mobility walks     Focused Assessments    R Recommendations: See Admitting Provider Note  Report given to:   Additional Notes:

## 2022-11-25 NOTE — Assessment & Plan Note (Signed)
Patient was placed on insulin sliding scale for glucose cover and monitoring.  Her glucose remained stable with fasting glucose at the time of her discharge at 109 mg/dl.

## 2022-11-25 NOTE — H&P (Addendum)
History and Physical    Patient: Anita Duke [id number] DOB: February 20, 1941 DOA: 11/25/2022 DOS: the patient was seen and examined on 11/25/2022 PCP: Binnie Rail, MD  Patient coming from: ALF/ILF  Chief Complaint:  Chief Complaint  Patient presents with   Weakness   Shortness of Breath   HPI: Anita Duke is a 82 y.o. female with medical history significant of AV node ablation sp pacemaker, chronic anemia, coronary artery disease, heart failure, hypertension, T2DM, mitral valve repair, atrial fibrillation and pulmonary hypertension who presented with dyspnea on exertion.  She has been at her usual state of health until 3 days ago when she developed, runny nose, congestion and dry cough, during the following day she developed dyspnea and generalized weakness, to the point she was note able to walk her dogs. This morning she had worsening dyspnea, to the point where she was dyspneic with minimal efforts. She call for help at her assisted living. EMS was called and patient was brought to the hospital.   Patient lives in assisted living, at her baseline she is able to walk her dogs about 4 times per day, about 1 mile at a time. She denies chest pain or angina, no PND, orthopnea or lower extremity edema.  No fever or chills.  She has been compliant with her medications.   Review of Systems: unable to review all systems due to the inability of the patient to answer questions. 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   Breast cancer ([place])    CAD (coronary artery disease)    LIMA to the LAD at time of mitral valve repair / LIMA atretic,, February, 2011   Cataracts, bilateral     removed bilat    Cecal cancer 2014   colon   CHF (congestive heart failure)    Chronic systolic dysfunction of left ventricle 05/21/2013   Clotting disorder    21 yrs ago blood clot in heart    Colon cancer ([place])    Colon polyp, hyperplastic    Diabetes mellitus type II, controlled 03/02/2009   Diabetic neuropathy 02/21/2018   [facility] - [facility]   Diverticulosis of colon    Diverticulosis of large intestine 08/04/2002   Ejection fraction < 50%    EF 30%, New diagnosis   february, 2011, etiology not clear, consider rate related tachycardia, and not use carvedilol, catheterization no constriction, /    EF 55-60% echo, June, 2011 /     Beta blocker stopped May, 2012 with reactive airway disease.  Patient does not tolerate metoprolol.  Carvedilol stopped  /   EF 55%, echo, February, 2012  //   EF 40-45%, septal dyssynergy, hypokinesis of the a   Essential hypertension 07/07/2008   Family history of adverse reaction to anesthesia    sister had a hard time waking up after anesthesia   Family history of colon cancer 04/29/2021   GERD (gastroesophageal reflux disease)    Heart murmur    Hemorrhoids, internal    High cholesterol    History of CABG    Hyperlipidemia 07/07/2008   Lumbar radiculopathy, acute  06/17/2018   Major depressive disorder 07/19/2018   Mitral regurgitation    a. s/p repair with LAA ligation and MAZE at time of surgery   Mitral stenosis    Mild, February, 2011, post mitral valve repair / mild, echo, June, 2011  //  Mild functional mitral stenosis, echo, February, 2012  //   mild stenosis, echo, December, 2013  //  mild, echo, August, 2014   //   Mild, echo, April, 2016    Osteopenia    left hip    Pacemaker-Medtronic 05/20/2012   Pacemaker placed September, 2013   //   upgraded to biventricular CRT pacemaker November 04, 2013    Permanent atrial fibrillation    a. s/p MAZE b. s/p AVN ablation   Personal history of colonic polyps 04/29/2021   PONV  (postoperative nausea and vomiting)    Pulmonary HTN    QT prolongation    Tikosyn and Effexor. QT prolonged October 13, 2011, peak is in dose reduced from 500  to -250 twice a day   Right ventricular dysfunction    Mild to moderate, echo, February, 2011 / normalized echo, June, 2011 /  RV normal, echo, February, 2012  //   right ventricle reported as good echo, December, 2013    S/P mitral valve repair    [facility] / Maze procedure/ atrial appendage removed were tied off    Sick sinus syndrome    s/p Medtronic dual chamber PPM 05/17/12    Sleep difficulties 09/13/2019   Vitamin D deficiency 02/20/2014   Past Surgical History:  Procedure Laterality Date   ABDOMINAL HYSTERECTOMY  ?2002   AV NODE ABLATION N/A 05/28/2012   Procedure: AV NODE ABLATION;  Surgeon: Thompson Grayer, MD;  Location: [facility] CATH LAB;  Service: Cardiovascular;  Laterality: N/A;   BI-VENTRICULAR PACEMAKER UPGRADE N/A 11/04/2013   upgrade of previously implanted dual chamber pacemaker to STJ Quadra Allure CRTP by Dr Rayann Heman   BIV PACEMAKER GENERATOR CHANGEOUT N/A 06/22/2022   Procedure: BIV PACEMAKER GENERATOR CHANGEOUT;  Surgeon: Constance Haw, MD;  Location: [place] CV LAB;  Service: Cardiovascular;  Laterality: N/A;   BREAST EXCISIONAL BIOPSY Left over 10 years ago   benign   BREAST LUMPECTOMY Left 05/26/2021   Procedure: LEFT BREAST LUMPECTOMY;  Surgeon: Stark Klein, MD;  Location: [place];  Service: General;  Laterality: Left;   BREAST SURGERY     CARDIOVERSION  02/08/2012   Procedure: CARDIOVERSION;  Surgeon: Carlena Bjornstad, MD;  Location: [place];  Service: Cardiovascular;  Laterality: N/A;   CARDIOVERSION  02/29/2012   Procedure: CARDIOVERSION;  Surgeon: Carlena Bjornstad, MD;  Location: [place];  Service: Cardiovascular;  Laterality: N/A;   CARDIOVERSION  05/15/2012   Procedure: CARDIOVERSION;  Surgeon: Carlena Bjornstad, MD;  Location: [place];  Service: Cardiovascular;  Laterality: N/A;   CATARACT  EXTRACTION, BILATERAL  2017   [place]   COLON SURGERY  2014   right hemicolectomy    COLONOSCOPY     CORONARY ARTERY BYPASS GRAFT  2001   CABG X1 "at time of mitral valve repair" (12/04/2012   DIAGNOSTIC LAPAROSCOPIC LIVER BIOPSY Left 12/03/2012   Procedure: DIAGNOSTIC LAPAROSCOPIC LIVER BIOPSY;  Surgeon: Stark Klein, MD;  Location: [place];  Service: General;  Laterality: Left;   EYE SURGERY     bilateral cataract surgery   LAPAROSCOPIC RIGHT HEMI COLECTOMY  12/03/2012   Procedure: LAPAROSCOPIC RIGHT HEMI COLECTOMY;  Surgeon: Stark Klein, MD;  Location: [place];  Service: General;;   LUMBAR LAMINECTOMY/ DECOMPRESSION WITH MET-RX Left 06/17/2018   Procedure: left Lumbar three-fourextraforaminal Microdiscectomy with Met-Rx;  Surgeon: Kristeen Miss, MD;  Location: [place];  Service: Neurosurgery;  Laterality: Left;   MAZE  2001   w/ MVR & CABG   MITRAL VALVE REPAIR  2001   "anterior and posterior leaflets" (12/04/2012)   PERMANENT PACEMAKER INSERTION N/A 05/17/2012   MDT Adapta L implanted by Dr Rayann Heman for tachy/brady syndrome   POLYPECTOMY     RADIOACTIVE SEED GUIDED EXCISIONAL BREAST BIOPSY Right 07/04/2022   Procedure: RADIOACTIVE SEED GUIDED EXCISIONAL RIGHT BREAST BIOPSY;  Surgeon: Stark Klein, MD;  Location: [place];  Service: General;  Laterality: Right;   TONSILLECTOMY AND ADENOIDECTOMY  ~ [id number]   UMBILICAL HERNIA REPAIR N/A 12/03/2012   Procedure: HERNIA REPAIR UMBILICAL ADULT;  Surgeon: Stark Klein, MD;  Location: [place];  Service: General;  Laterality: N/A;   Social History:  reports that she has never smoked. She has never used smokeless tobacco. She reports that she does not drink alcohol and does not use drugs.  Allergies  Allergen Reactions   Demerol [Meperidine] Nausea And Vomiting   Morphine Nausea And Vomiting   Januvia [Sitagliptin] Other (See Comments)    Headaches, did not feel well   Metformin And Related Diarrhea   Tetanus Toxoid Rash and Other (See  Comments)    "years ago"    Family History  Problem Relation Age of Onset   Diabetes Mother    Kidney disease Mother    Thyroid disease Mother    Alzheimer's disease Mother        Symptom onset in late 72s; confirmed via autopsy   Depression Mother    Heart disease Father    Diabetes Father    Heart disease Sister        x 2   Colon cancer Maternal Aunt        dx 59s; mother's paternal half sister   Colon cancer Maternal Aunt        dx late 94s   Heart disease Paternal Uncle        x 6   Cancer Maternal Grandfather        unknown type; dx unknown age   Diabetes Paternal Grandmother    Diabetes Paternal Grandfather    Colon cancer Cousin    Brain cancer Nephew 52   Colon polyps Neg Hx    Adrenal disorder Neg Hx    Esophageal cancer Neg Hx    Rectal cancer Neg Hx    Stomach cancer Neg Hx     Prior to Admission medications   Medication Sig Start Date End Date Taking? Authorizing Provider  amoxicillin (AMOXIL) 500 MG capsule TAKE 4 CAPSULES PRIOR TO DENTAL APPOINTMENTS AS DIRECTED 10/13/22 10/13/23  Binnie Rail, MD  atorvastatin (LIPITOR) 40 MG tablet Take 1 tablet (40 mg total) by mouth daily. 03/16/22   Bensimhon, Shaune Pascal, MD  carvedilol (COREG) 12.5 MG tablet Take 1 tablet (12.5 mg total) by mouth 2 (two) times daily with a meal. 03/16/22   Bensimhon, Shaune Pascal, MD  cholecalciferol (VITAMIN D) 1000 UNITS tablet Take 1,000 Units by mouth daily.     [provider]  cyanocobalamin (VITAMIN B12) 500 MCG tablet Take 500 mcg by mouth daily.    [provider]  empagliflozin (JARDIANCE) 25 MG TABS tablet Take 1 tablet (25 mg total) by mouth daily. 08/10/22   Binnie Rail, MD  ENTRESTO 24-26 MG  TAKE 1 TABLET TWICE A DAY 11/17/22   Larey Dresser, MD  fish oil-omega-3 fatty acids 1000 MG capsule Take 1 g by mouth 2 (two) times daily.    [provider]  loratadine (CLARITIN) 10 MG tablet Take 10 mg by mouth daily.    [provider]  Polyethyl  Glycol-Propyl Glycol (SYSTANE) 0.4-0.3 % SOLN Place 1 drop into both eyes daily.    [provider]  sodium chloride (OCEAN) 0.65 % SOLN nasal spray Place 1 spray into both nostrils 2 (two) times daily.    [provider]  spironolactone (ALDACTONE) 25 MG tablet Take 1 tablet (25 mg total) by mouth daily. 03/16/22   Bensimhon, Shaune Pascal, MD  tamoxifen (NOLVADEX) 20 MG tablet Take 1 tablet (20 mg total) by mouth daily. 07/13/22   Alla Feeling, NP  venlafaxine XR (EFFEXOR-XR) 37.5 MG 24 hr capsule Take 1 capsule (37.5 mg total) by mouth daily. 07/13/22   Alla Feeling, NP  warfarin (COUMADIN) 2.5 MG tablet TAKE 1 TABLET BY MOUTH DAILY OR AS DIRECTED BY ANTICOAGULATION  CLINIC Patient taking differently: Take 2.5 mg by mouth every evening.  OR AS DIRECTED BY ANTICOAGULATION  CLINIC 03/29/22   Binnie Rail, MD    Physical Exam: Vitals:   11/25/22 1122 11/25/22 1130 11/25/22 1323 11/25/22 1346  BP:    133/67  Pulse: 70 69  78  Resp: 17 17  18   Temp:   98 F (36.7 C) 97.7 F (36.5 C)  TempSrc:   Oral Oral  SpO2: 99% 99%  97%   Neurology awake and alert ENT with mild pallor Cardiovascular with S1 and S2 present and rhythmic, with no gallops, positive murmur at the apex.  No JVD No lower extremity edema. Respiratory with no rales, wheezing or rhonchi,  Abdomen with no distention  No rashes.  Data Reviewed:   Na 137, K 3,7 cl 105 bicarbonate 17 glucose 243 bun 20 cr 0,88  AST 66 ALT 34  High sensitive troponin 3,029- 2,705 Wbc 6,5 hgb 16,2 plt 204  INR 2,4  Sars covid negative Influenza A and B negative   Chest radiograph with cardiomegaly, with no effusions. Has faint opacity at her right upper lobe, no air bronchogram. Pacemaker in place with one atrial and two ventricular leads.   EKG 70 bpm, left axis, prolonged qrs, ventricular paced rhythm with no significant ST segment changes, negative T waves except in V1 (no change from prior EKG).   82 yo female with  extensive cardiovascular history including coronary artery disease, atrial fibrillation and heart failure who present with upper respiratory tract symptoms that progressed to dyspnea over the last 3 days. On her physical examination she is hemodynamically stable, her lungs are clear to auscultation and has no signs of hypervolemia.  Renal function has a normal serum creatinine.  INR is therapeutic.  COVID 19 and influenza are negative.  Chest radiograph with faint opacity right upper lobe.  Significant elevation in high sensitive troponin with no EKG changes (paced rhythm).   Dx possible viral myocarditis.   Assessment and Plan: Myocarditis ([place]) High sensitive troponin is trending down.  Patient with no chest pain.   Plan to continue conservative medical care.  Follow up on repeat echocardiogram.  Continue telemetry monitoring  Check respiratory viral panel  Chest film with no frank pneumonic infiltrate, she has received antibiotic therapy in the ED, will repeat chest film in am, for now hold on antibiotic therapy.  CAD (coronary artery disease) Patient had revascularization in the past.  She is physically active at her baseline with no angina.  Continue blood pressure control Follow up with cardiology recommendations, her symptoms are not typical for acute coronary syndrome.   Essential hypertension Continue blood pressure control with carvedilol, spironolactone and entresto.   Atrial fibrillation Continue rate control with carvedilol. Patient on warfarin for anticoagulation for valvular atrial fibrillation. INR is therapeutic.   Type 2 diabetes mellitus with hyperlipidemia (HCC) Continue glucose cover and monitoring with insulin sliding scale.  Patient is tolerating po well.   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      Advance Care Planning:   Code Status: Full Code   Consults: cardiology   Family Communication: no  family at the bedside   Severity of Illness: The appropriate patient status for this patient is INPATIENT. Inpatient status is judged to be reasonable and necessary in order to provide the required intensity of service to ensure the patient's safety. The patient's presenting symptoms, physical exam findings, and initial radiographic and laboratory data in the context of their chronic comorbidities is felt to place them at high risk for further clinical deterioration. Furthermore, it is not anticipated that the patient will be medically stable for discharge from the hospital within 2 midnights of admission.   * I certify that at the point of admission it is my clinical judgment that the patient will require inpatient hospital care spanning beyond 2 midnights from the point of admission due to high intensity of service, high risk for further deterioration and high frequency of surveillance required.*  Author: Tawni Millers, MD 11/25/2022 2:33 PM  For on call review [email or web address].

## 2022-11-26 ENCOUNTER — Inpatient Hospital Stay (HOSPITAL_COMMUNITY): Payer: Medicare HMO

## 2022-11-26 DIAGNOSIS — E1169 Type 2 diabetes mellitus with other specified complication: Secondary | ICD-10-CM | POA: Diagnosis not present

## 2022-11-26 DIAGNOSIS — I10 Essential (primary) hypertension: Secondary | ICD-10-CM | POA: Diagnosis not present

## 2022-11-26 DIAGNOSIS — B3322 Viral myocarditis: Secondary | ICD-10-CM | POA: Diagnosis not present

## 2022-11-26 DIAGNOSIS — I4891 Unspecified atrial fibrillation: Secondary | ICD-10-CM | POA: Diagnosis not present

## 2022-11-26 LAB — CBC
HCT: 46 % (ref 36.0–46.0)
Hemoglobin: 14.9 g/dL (ref 12.0–15.0)
MCH: 30 pg (ref 26.0–34.0)
MCHC: 32.4 g/dL (ref 30.0–36.0)
MCV: 92.6 fL (ref 80.0–100.0)
Platelets: 175 10*3/uL (ref 150–400)
RBC: 4.97 MIL/uL (ref 3.87–5.11)
RDW: 13.4 % (ref 11.5–15.5)
WBC: 7.3 10*3/uL (ref 4.0–10.5)
nRBC: 0 % (ref 0.0–0.2)

## 2022-11-26 LAB — BASIC METABOLIC PANEL
Anion gap: 9 (ref 5–15)
BUN: 22 mg/dL (ref 8–23)
CO2: 24 mmol/L (ref 22–32)
Calcium: 9 mg/dL (ref 8.9–10.3)
Chloride: 106 mmol/L (ref 98–111)
Creatinine, Ser: 0.71 mg/dL (ref 0.44–1.00)
GFR, Estimated: >60 mL/min (ref >60)
Glucose, Bld: 114 mg/dL — ABNORMAL HIGH (ref 70–99)
Potassium: 4.1 mmol/L (ref 3.5–5.1)
Sodium: 139 mmol/L (ref 135–145)

## 2022-11-26 LAB — GLUCOSE, CAPILLARY
Glucose-Capillary: 100 mg/dL — ABNORMAL HIGH (ref 70–99)
Glucose-Capillary: 107 mg/dL — ABNORMAL HIGH (ref 70–99)
Glucose-Capillary: 132 mg/dL — ABNORMAL HIGH (ref 70–99)
Glucose-Capillary: 168 mg/dL — ABNORMAL HIGH (ref 70–99)

## 2022-11-26 LAB — PROTIME-INR
INR: 2.2 — ABNORMAL HIGH (ref 0.8–1.2)
Prothrombin Time: 24.1 seconds — ABNORMAL HIGH (ref 11.4–15.2)

## 2022-11-26 MED ORDER — ORAL CARE MOUTH RINSE: 15.0000 mL | OROMUCOSAL | Status: DC | PRN

## 2022-11-26 MED ORDER — GUAIFENESIN-DM 100-10 MG/5ML PO SYRP
5.0000 mL | ORAL_SOLUTION | ORAL | Status: DC | PRN
  Administered 2022-11-26 (×2): 5 mL via ORAL
  Filled 2022-11-26 (×2): qty 5

## 2022-11-26 MED ORDER — FUROSEMIDE 10 MG/ML IJ SOLN
40.0000 mg | Freq: Once | INTRAMUSCULAR | Status: AC
  Administered 2022-11-26: 40 mg via INTRAVENOUS
  Filled 2022-11-26: qty 4

## 2022-11-26 NOTE — Progress Notes (Signed)
  Progress Note   Patient: Anita Duke [id number] DOB: 1941/05/20 DOA: 11/25/2022     1 DOS: the patient was seen and examined on 11/26/2022   Brief hospital course: Anita Duke was admitted to the hospital with the working diagnosis of myocarditis.   82 yo female with extensive cardiovascular history including coronary artery disease, atrial fibrillation and heart failure who present with upper respiratory tract symptoms that progressed to dyspnea over the last 3 days. On her physical examination she is hemodynamically stable, her lungs are clear to auscultation and has no signs of hypervolemia.  Renal function has a normal serum creatinine.  INR is therapeutic.  COVID 19 and influenza are negative.  Chest radiograph with faint opacity right upper lobe.  Significant elevation in high sensitive troponin with no EKG changes (paced rhythm).   03/24 positive for metapneumovirus. Added IV furosemide for pulmonary edema, bacterial pneumonia has been ruled out.   Assessment and Plan: Myocarditis (Anita Duke) High sensitive troponin is trending down.  Patient with no chest pain.  Positive for metapneumovirus.  Follow up chest radiograph personally reviewed with right upper lobe plate like atelectasis. Cardiomegaly with bilateral hilar congestion and cephalization of the vasculature.   Plan to follow up on echocardiogram.  One dose of IV furosemide 40 mg Continue telemetry monitoring.  Follow up with cardiology recommendations, she may still need coronary angiography.  Anticoagulation with INR at 2.2 plan to start heparin when less than 2,0     CAD (coronary artery disease) Patient had revascularization in the past.  She is physically active at her baseline with no angina.  Follow up echocardiogram.  INR is 2,0  Patient may need coronary angiography on this hospitalization.   Essential hypertension Continue blood pressure control with carvedilol, spironolactone and entresto.    Atrial fibrillation Continue rate control with carvedilol. Patient on warfarin for anticoagulation for valvular atrial fibrillation. INR is therapeutic.   Type 2 diabetes mellitus with hyperlipidemia (HCC) Continue glucose cover and monitoring with insulin sliding scale.  Patient is tolerating po well.   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        Subjective: Patient is feeling congested and dyspneic at rest, her strength at her lower extremities is getting better, no chest pain. Positive cough with no wheezing.   Physical Exam: Vitals:   11/26/22 0031 11/26/22 0121 11/26/22 0517 11/26/22 0825  BP:   112/62 (!) 124/55  Pulse:   74 70  Resp:   18 18  Temp:   97.6 F (36.4 C) 97.8 F (36.6 C)  TempSrc:   Oral Oral  SpO2: 96%  94% 95%  Weight:  63.7 kg     Neurology awake and alert ENT with on pallor Cardiovascular with S1 and S2 present and regular with no gallops, or rubs, no murmurs No JVD No lower extremity edema Respiratory with rales bilaterally with no wheezing or rhonchi Abdomen with no distention  Data Reviewed:    Family Communication: no family at the bedside   Disposition: Status is: Inpatient Remains inpatient appropriate because: heart failure   Planned Discharge Destination: Home      Author: Tawni Millers, MD 11/26/2022 10:36 AM  For on call review [email or web address].

## 2022-11-26 NOTE — Progress Notes (Signed)
ANTICOAGULATION CONSULT NOTE - Initial Consult  Pharmacy Consult for heparin  Indication: chest pain/ACS  Allergies  Allergen Reactions   Demerol [Meperidine] Nausea And Vomiting   Morphine Nausea And Vomiting   Januvia [Sitagliptin] Other (See Comments)    Headaches, did not feel well   Metformin And Related Diarrhea   Tetanus Toxoid Rash and Other (See Comments)    "years ago"     Vital Signs: Temp: 97.6 F (36.4 C) (03/24 0517) Temp Source: Oral (03/24 0517) BP: 112/62 (03/24 0517) Pulse Rate: 74 (03/24 0517)  Labs: Recent Labs    11/25/22 0900 11/25/22 0929 11/25/22 1125 11/26/22 0104  HGB 16.2* 16.7*  --  14.9  HCT 49.3* 49.0*  --  46.0  PLT 204  --   --  175  LABPROT  --   --  25.9* 24.1*  INR  --   --  2.4* 2.2*  CREATININE 0.88 0.60  --  0.71  TROPONINIHS 3,029*  --  2,705*  --      Estimated Creatinine Clearance: 45.2 mL/min (by C-G formula based on SCr of 0.71 mg/dL).   Medical History: 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   Breast cancer ([place])    CAD (coronary artery disease)    LIMA to the LAD at time of mitral valve repair / LIMA atretic,, February, 2011   Cataracts, bilateral    removed bilat    Cecal cancer 2014   colon   CHF (congestive heart failure)    Chronic systolic dysfunction of left ventricle 05/21/2013   Clotting disorder    21 yrs ago blood clot in heart    Colon cancer ([place])    Colon polyp, hyperplastic    Diabetes mellitus type II, controlled 03/02/2009   Diabetic neuropathy 02/21/2018   [facility] - [facility] [place]   Diverticulosis of colon    Diverticulosis of large intestine 08/04/2002   Ejection fraction < 50%     EF 30%, New diagnosis   february, 2011, etiology not clear, consider rate related tachycardia, and not use carvedilol, catheterization no constriction, /    EF 55-60% echo, June, 2011 /     Beta blocker stopped May, 2012 with reactive airway disease.  Patient does not tolerate metoprolol.  Carvedilol stopped  /   EF 55%, echo, February, 2012  //   EF 40-45%, septal dyssynergy, hypokinesis of the a   Essential hypertension 07/07/2008   Family history of adverse reaction to anesthesia    sister had a hard time waking up after anesthesia   Family history of colon cancer 04/29/2021   GERD (gastroesophageal reflux disease)    Heart murmur    Hemorrhoids, internal    High cholesterol    History of CABG    Hyperlipidemia 07/07/2008   Lumbar radiculopathy, acute 06/17/2018   Major depressive disorder 07/19/2018   Mitral regurgitation    a. s/p repair with LAA ligation and MAZE at time of surgery   Mitral stenosis    Mild, February, 2011, post mitral valve repair / mild, echo, June, 2011  //  Mild functional mitral stenosis,  echo, February, 2012  //   mild stenosis, echo, December, 2013  //  mild, echo, August, 2014   //   Mild, echo, April, 2016    Osteopenia    left hip    Pacemaker-Medtronic 05/20/2012   Pacemaker placed September, 2013   //   upgraded to biventricular CRT pacemaker November 04, 2013    Permanent atrial fibrillation    a. s/p MAZE b. s/p AVN ablation   Personal history of colonic polyps 04/29/2021   PONV (postoperative nausea and vomiting)    Pulmonary HTN    QT prolongation    Tikosyn and Effexor. QT prolonged October 13, 2011, peak is in dose reduced from 500  to -250 twice a day   Right ventricular dysfunction    Mild to moderate, echo, February, 2011 / normalized echo, June, 2011 /  RV normal, echo, February, 2012  //   right ventricle reported as good echo, December, 2013    S/P mitral valve repair    [facility] / Maze procedure/ atrial appendage removed were tied off     Sick sinus syndrome    s/p Medtronic dual chamber PPM 05/17/12    Sleep difficulties 09/13/2019   Vitamin D deficiency 02/20/2014    Assessment: Patient admitted with CC of chest pain. Trop elevated to 2705. 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 INR therapeutic today at 2.4, med rec not complete but per outpatient anticoag notes (3/12) appears patient was taking 3.75mg  on Fri and 2.5mg  all other days. Pharmacy consulted to dose heparin once INR < 2.0 per cardiology.   3/24- INR 2.2. Hgb 14.9, PLT 175.   Goal of Therapy:  Heparin level 0.3-0.7 units/ml Monitor platelets by anticoagulation protocol: Yes   Plan:  Continue to hold heparin today  Daily INR, start heparin once INR < 2.0.  Eliseo Gum, PharmD PGY1 Pharmacy Resident   11/26/2022  7:58 AM

## 2022-11-26 NOTE — Hospital Course (Signed)
Anita Duke was admitted to the hospital with the working diagnosis of myocarditis.   82 yo female with extensive cardiovascular history including coronary artery disease, atrial fibrillation and heart failure who present with upper respiratory tract symptoms that progressed to dyspnea over the last 3 days. On her physical examination she was hemodynamically stable, her lungs were clear to auscultation and has no signs of hypervolemia.  Renal function has a normal serum creatinine.  INR is therapeutic.  COVID 19 and influenza are negative.  Chest radiograph with faint opacity right upper lobe.  Significant elevation in high sensitive troponin with no EKG changes (paced rhythm).   03/24 positive for metapneumovirus. Added IV furosemide for pulmonary edema, bacterial pneumonia has been ruled out.  03/25 pending echocardiogram. 03/26 echocardiogram with no significant wall motion abnormalities, preserved LV systolic function.  Coronary CT suggestive for total occlusion of left circumflex.  03/27 patient with improvement in her symptoms. Plan for close follow up as outpatient with cardiology. If she has recurrent symptoms may need cardiac catheterization.

## 2022-11-26 NOTE — Progress Notes (Signed)
   Patient Name: Anita Duke Date of Encounter: 11/26/2022 [facility] Cardiologist: Loralie Champagne, MD   Interval Summary   No chest pain.  Still not breathing quite at baseline.   Vital Signs   Vitals:   11/26/22 0031 11/26/22 0121 11/26/22 0517 11/26/22 0825  BP:   112/62 (!) 124/55  Pulse:   74 70  Resp:   18 18  Temp:   97.6 F (36.4 C) 97.8 F (36.6 C)  TempSrc:   Oral Oral  SpO2: 96%  94% 95%  Weight:  63.7 kg      Intake/Output Summary (Last 24 hours) at 11/26/2022 1107 Last data filed at 11/25/2022 2101 Gross per 24 hour  Intake 600 ml  Output --  Net 600 ml      11/26/2022    1:21 AM 10/02/2022    1:54 PM 10/02/2022    8:54 AM  Last 3 Weights  Weight (lbs) 140 lb 8 oz 142 lb 143 lb  Weight (kg) 63.73 kg 64.411 kg 64.864 kg      Telemetry/ECG    Atrial fib with ventricular pacing- Personally Reviewed  Physical Exam  GEN: No acute distress.   Neck: No JVD Cardiac: RRR, no murmurs, rubs, or gallops.  Respiratory: Clear to auscultation bilaterally. GI: Soft, nontender, non-distended  MS: No edema  Assessment & Plan    NSTEMI:   Awaiting the results of the echo and will decide on cath vs non invasive testing.  Could do CT.   For now agree with Heparin when INR is less than 2 and holding warfarin although I am leaning toward a non invasive approach.   I will likely order a CT as an out patient unless the echo is markedly abnormal compared to previous.   ATRIAL FIB:    As above.  Underlying fib.  Chronic anticoagulation.   CHRONIC SYSTOLIC HF:    Seems to have some evidence of increased volume with abnormal CXR.   Agree with Lasix IV given.  Echo is pending.     ACUTE DYSPNEA:  Pneumonia with metapneumonia    For questions or updates, please contact [facility] Please consult [email or web address] for contact info under        Signed, Minus Breeding, MD

## 2022-11-27 ENCOUNTER — Encounter (HOSPITAL_COMMUNITY): Payer: Self-pay

## 2022-11-27 ENCOUNTER — Inpatient Hospital Stay (HOSPITAL_COMMUNITY): Payer: Medicare HMO

## 2022-11-27 DIAGNOSIS — B3322 Viral myocarditis: Secondary | ICD-10-CM | POA: Diagnosis not present

## 2022-11-27 DIAGNOSIS — E876 Hypokalemia: Secondary | ICD-10-CM | POA: Diagnosis not present

## 2022-11-27 DIAGNOSIS — I10 Essential (primary) hypertension: Secondary | ICD-10-CM | POA: Diagnosis not present

## 2022-11-27 DIAGNOSIS — R079 Chest pain, unspecified: Secondary | ICD-10-CM

## 2022-11-27 DIAGNOSIS — I4891 Unspecified atrial fibrillation: Secondary | ICD-10-CM | POA: Diagnosis not present

## 2022-11-27 LAB — ECHOCARDIOGRAM COMPLETE
AR max vel: 2.7 cm2
AV Area VTI: 2.4 cm2
AV Area mean vel: 2.53 cm2
AV Mean grad: 3 mmHg
AV Peak grad: 4.9 mmHg
Ao pk vel: 1.11 m/s
MV VTI: 1.35 cm2
S' Lateral: 3.28 cm
Weight: 2230.4 oz

## 2022-11-27 LAB — GLUCOSE, CAPILLARY
Glucose-Capillary: 116 mg/dL — ABNORMAL HIGH (ref 70–99)
Glucose-Capillary: 193 mg/dL — ABNORMAL HIGH (ref 70–99)
Glucose-Capillary: 104 mg/dL — ABNORMAL HIGH (ref 70–99)
Glucose-Capillary: 145 mg/dL — ABNORMAL HIGH (ref 70–99)

## 2022-11-27 LAB — BASIC METABOLIC PANEL
Anion gap: 8 (ref 5–15)
BUN: 27 mg/dL — ABNORMAL HIGH (ref 8–23)
CO2: 27 mmol/L (ref 22–32)
Calcium: 8.6 mg/dL — ABNORMAL LOW (ref 8.9–10.3)
Chloride: 99 mmol/L (ref 98–111)
Creatinine, Ser: 0.83 mg/dL (ref 0.44–1.00)
GFR, Estimated: >60 mL/min (ref >60)
Glucose, Bld: 119 mg/dL — ABNORMAL HIGH (ref 70–99)
Potassium: 3.4 mmol/L — ABNORMAL LOW (ref 3.5–5.1)
Sodium: 134 mmol/L — ABNORMAL LOW (ref 135–145)

## 2022-11-27 LAB — MAGNESIUM: Magnesium: 2 mg/dL (ref 1.7–2.4)

## 2022-11-27 LAB — HEPARIN LEVEL (UNFRACTIONATED): Heparin Unfractionated: 0.17 IU/mL — ABNORMAL LOW (ref 0.30–0.70)

## 2022-11-27 LAB — PROTIME-INR
INR: 1.6 — ABNORMAL HIGH (ref 0.8–1.2)
Prothrombin Time: 19 seconds — ABNORMAL HIGH (ref 11.4–15.2)

## 2022-11-27 MED ORDER — WARFARIN SODIUM 2.5 MG PO TABS
3.7500 mg | ORAL_TABLET | Freq: Once | ORAL | Status: AC
  Administered 2022-11-27: 3.75 mg via ORAL
  Filled 2022-11-27: qty 1

## 2022-11-27 MED ORDER — WARFARIN - PHARMACIST DOSING INPATIENT: Freq: Every day | Status: DC

## 2022-11-27 MED ORDER — WARFARIN SODIUM 2.5 MG PO TABS: 3.7500 mg | ORAL_TABLET | Freq: Once | ORAL | Status: DC

## 2022-11-27 MED ORDER — HEPARIN (PORCINE) 25000 UT/250ML-% IV SOLN
1100.0000 [IU]/h | INTRAVENOUS | Status: DC
  Administered 2022-11-27: 750 [IU]/h via INTRAVENOUS
  Administered 2022-11-29: 1100 [IU]/h via INTRAVENOUS
  Filled 2022-11-27 (×3): qty 250

## 2022-11-27 MED ORDER — POTASSIUM CHLORIDE CRYS ER 20 MEQ PO TBCR
40.0000 meq | EXTENDED_RELEASE_TABLET | Freq: Once | ORAL | Status: AC
  Administered 2022-11-27: 40 meq via ORAL
  Filled 2022-11-27: qty 2

## 2022-11-27 MED ORDER — POTASSIUM CHLORIDE CRYS ER 20 MEQ PO TBCR
20.0000 meq | EXTENDED_RELEASE_TABLET | Freq: Once | ORAL | Status: AC
  Administered 2022-11-27: 20 meq via ORAL
  Filled 2022-11-27: qty 1

## 2022-11-27 NOTE — Progress Notes (Signed)
ANTICOAGULATION CONSULT NOTE - Initial Consult  Pharmacy Consult for heparin  Indication: chest pain/ACS  Allergies  Allergen Reactions   Demerol [Meperidine] Nausea And Vomiting   Morphine Nausea And Vomiting   Januvia [Sitagliptin] Other (See Comments)    Headaches, did not feel well   Metformin And Related Diarrhea   Tetanus Toxoid Rash and Other (See Comments)    "years ago"     Vital Signs: Temp: 98 F (36.7 C) (03/25 0454) Temp Source: Oral (03/25 0454) BP: 125/72 (03/25 0454) Pulse Rate: 74 (03/25 0454)  Labs: Recent Labs    11/25/22 0900 11/25/22 0929 11/25/22 1125 11/26/22 0104 11/27/22 0052  HGB 16.2* 16.7*  --  14.9  --   HCT 49.3* 49.0*  --  46.0  --   PLT 204  --   --  175  --   LABPROT  --   --  25.9* 24.1* 19.0*  INR  --   --  2.4* 2.2* 1.6*  CREATININE 0.88 0.60  --  0.71 0.83  TROPONINIHS 3,029*  --  2,705*  --   --      Estimated Creatinine Clearance: 43.4 mL/min (by C-G formula based on SCr of 0.83 mg/dL).   Medical History: 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   Breast cancer ([place])    CAD (coronary artery disease)    LIMA to the LAD at time of mitral valve repair / LIMA atretic,, February, 2011   Cataracts, bilateral    removed bilat    Cecal cancer 2014   colon   CHF (congestive heart failure)    Chronic systolic dysfunction of left ventricle 05/21/2013   Clotting disorder    21 yrs ago blood clot in heart    Colon cancer ([place])    Colon polyp, hyperplastic    Diabetes mellitus type II, controlled 03/02/2009   Diabetic neuropathy 02/21/2018   [facility] - [facility] [place]   Diverticulosis of colon    Diverticulosis of large  intestine 08/04/2002   Ejection fraction < 50%    EF 30%, New diagnosis   february, 2011, etiology not clear, consider rate related tachycardia, and not use carvedilol, catheterization no constriction, /    EF 55-60% echo, June, 2011 /     Beta blocker stopped May, 2012 with reactive airway disease.  Patient does not tolerate metoprolol.  Carvedilol stopped  /   EF 55%, echo, February, 2012  //   EF 40-45%, septal dyssynergy, hypokinesis of the a   Essential hypertension 07/07/2008   Family history of adverse reaction to anesthesia    sister had a hard time waking up after anesthesia   Family history of colon cancer 04/29/2021   GERD (gastroesophageal reflux disease)    Heart murmur    Hemorrhoids, internal    High cholesterol    History of CABG    Hyperlipidemia 07/07/2008   Lumbar radiculopathy, acute 06/17/2018   Major depressive disorder 07/19/2018   Mitral regurgitation    a. s/p repair with LAA ligation and MAZE at time of surgery   Mitral stenosis    Mild, February,  2011, post mitral valve repair / mild, echo, June, 2011  //  Mild functional mitral stenosis, echo, February, 2012  //   mild stenosis, echo, December, 2013  //  mild, echo, August, 2014   //   Mild, echo, April, 2016    Osteopenia    left hip    Pacemaker-Medtronic 05/20/2012   Pacemaker placed September, 2013   //   upgraded to biventricular CRT pacemaker November 04, 2013    Permanent atrial fibrillation    a. s/p MAZE b. s/p AVN ablation   Personal history of colonic polyps 04/29/2021   PONV (postoperative nausea and vomiting)    Pulmonary HTN    QT prolongation    Tikosyn and Effexor. QT prolonged October 13, 2011, peak is in dose reduced from 500  to -250 twice a day   Right ventricular dysfunction    Mild to moderate, echo, February, 2011 / normalized echo, June, 2011 /  RV normal, echo, February, 2012  //   right ventricle reported as good echo, December, 2013    S/P mitral valve repair    [facility] / [facility]  procedure/ atrial appendage removed were tied off    Sick sinus syndrome    s/p Medtronic dual chamber PPM 05/17/12    Sleep difficulties 09/13/2019   Vitamin D deficiency 02/20/2014    Assessment: Patient admitted with CC of chest pain. Trop elevated to 2705. 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 INR therapeutic today at 2.4, med rec not complete but per outpatient anticoag notes (3/12) appears patient was taking 3.75mg  on Fri and 2.5mg  all other days. Pharmacy consulted to dose heparin once INR < 2.0 per cardiology.   3/25- INR 1.6. start heparin once INR < 2.0.  Goal of Therapy:  Heparin level 0.3-0.7 units/ml Monitor platelets by anticoagulation protocol: Yes   Plan:  Start heparin at 750 units/hr Check Heparin level in 8 hours Daily heparin level and CBC while on heparin therapy  Alanda Slim, PharmD, [facility] Clinical Pharmacist Please see AMION for all Pharmacists' Contact Phone Numbers 11/27/2022, 7:23 AM

## 2022-11-27 NOTE — Progress Notes (Signed)
Progress Note   Patient: Anita Duke [id number] DOB: 05-12-1941 DOA: 11/25/2022     2 DOS: the patient was seen and examined on 11/27/2022   Brief hospital course: Mrs. Gunion was admitted to the hospital with the working diagnosis of myocarditis.   82 yo female with extensive cardiovascular history including coronary artery disease, atrial fibrillation and heart failure who present with upper respiratory tract symptoms that progressed to dyspnea over the last 3 days. On her physical examination she is hemodynamically stable, her lungs are clear to auscultation and has no signs of hypervolemia.  Renal function has a normal serum creatinine.  INR is therapeutic.  COVID 19 and influenza are negative.  Chest radiograph with faint opacity right upper lobe.  Significant elevation in high sensitive troponin with no EKG changes (paced rhythm).   03/24 positive for metapneumovirus. Added IV furosemide for pulmonary edema, bacterial pneumonia has been ruled out.  03/25 pending echocardiogram.  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  Patient with no chest pain.  Positive for metapneumovirus.  03/24 Follow up chest radiograph personally reviewed with right upper lobe plate like atelectasis. Cardiomegaly with bilateral hilar congestion and cephalization of the vasculature.   Continue telemetry monitoring.  Follow up with cardiology for ischemic work up.  Follow up with echocardiogram.  INR today is 1,6, started on IV heparin for anticoagulation.    CAD (coronary artery disease) Patient had revascularization in the past.  She is physically active at her baseline with no angina.  Follow up echocardiogram.  INR is 1,6  Now on heparin drip for anticoagulation, follow up with echocardiogram.  Possible inpatient ischemic work up discussed with Dr. Percival Spanish.   Hypokalemia Renal function with serum cr at 0,83, K is 3,4 and serum  bicarbonate at 27,  Na 134 and BUN 27, Mg 2.0   Plan to continue K correction with oral Kcl Follow up renal function in am.  Continue diuresis with spironolactone and SGLT 2 inh.   Essential hypertension Continue blood pressure control with carvedilol, spironolactone and entresto.   Atrial fibrillation Continue rate control with carvedilol. Transition to heparin drip while cardiac workup.   Type 2 diabetes mellitus with hyperlipidemia (HCC) Continue glucose cover and monitoring with insulin sliding scale.  Patient is tolerating po well.  Fasting glucose is 119.   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        Subjective: Patient is feeling better, but not yet back to baseline, no chest pain, no dyspnea, no PND or orthopnea.   Physical Exam: Vitals:   11/27/22 0029 11/27/22 0454 11/27/22 0757 11/27/22 1122  BP: (!) 109/53 125/72 116/60 (!) 102/51  Pulse: 70 74 73 70  Resp: 17 18 18 17   Temp: 97.8 F (36.6 C) 98 F (36.7 C) 98 F (36.7 C) 98 F (36.7 C)  TempSrc: Oral Oral Oral Oral  SpO2: 95% 93% 95% 92%  Weight:  63.2 kg     Neurology awake and alert ENT with mild pallor Cardiovascular with S1 and S2 present and rhythmic with no gallops, rubs or murmurs No JVD No lower extremity edema Respiratory with no rales, wheezing or rhonchi Abdomen with no distention  Data Reviewed:    Family Communication: no family at the bedside   Disposition: Status is: Inpatient Remains inpatient appropriate because: pending cardiac work up  Planned Discharge Destination: Home    Author: Tawni Millers, MD 11/27/2022 1:12  PM  For on call review [email or web address].

## 2022-11-27 NOTE — Progress Notes (Signed)
Heart Failure Navigator Progress Note  Assessed for Heart & Vascular TOC clinic readiness.  Patient does not meet criteria due to Advanced Heart Failure Patient of Dr. Aundra Dubin..   Navigator will sign off at this time.   Earnestine Leys, BSN, Clinical cytogeneticist Only

## 2022-11-27 NOTE — Assessment & Plan Note (Addendum)
Today renal function with serum cr at 0,88 with K at 3,7 and serum bicarbonate at 27. Na 135 and Mg 2,0  Plan to continue K correction with oral Kcl Follow up renal function in am.  Continue diuresis with spironolactone and SGLT 2 inh.

## 2022-11-27 NOTE — Progress Notes (Signed)
Echocardiogram 2D Echocardiogram has been performed.  Ronny Flurry 11/27/2022, 1:19 PM

## 2022-11-27 NOTE — Progress Notes (Signed)
ANTICOAGULATION CONSULT NOTE  Pharmacy Consult for heparin  Indication: chest pain/ACS  Allergies  Allergen Reactions   Demerol [Meperidine] Nausea And Vomiting   Morphine Nausea And Vomiting   Januvia [Sitagliptin] Other (See Comments)    Headaches, did not feel well   Metformin And Related Diarrhea   Tetanus Toxoid Rash and Other (See Comments)    "years ago"     Vital Signs: Temp: 98 F (36.7 C) (03/25 1610) Temp Source: Oral (03/25 1610) BP: 105/59 (03/25 1610) Pulse Rate: 71 (03/25 1610)  Labs: Recent Labs    11/25/22 0900 11/25/22 0929 11/25/22 1125 11/26/22 0104 11/27/22 0052 11/27/22 1558  HGB 16.2* 16.7*  --  14.9  --   --   HCT 49.3* 49.0*  --  46.0  --   --   PLT 204  --   --  175  --   --   LABPROT  --   --  25.9* 24.1* 19.0*  --   INR  --   --  2.4* 2.2* 1.6*  --   HEPARINUNFRC  --   --   --   --   --  0.17*  CREATININE 0.88 0.60  --  0.71 0.83  --   TROPONINIHS 3,029*  --  2,705*  --   --   --      Estimated Creatinine Clearance: 43.4 mL/min (by C-G formula based on SCr of 0.83 mg/dL).   Medical History: 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   Breast cancer ([place])    CAD (coronary artery disease)    LIMA to the LAD at time of mitral valve repair / LIMA atretic,, February, 2011   Cataracts, bilateral    removed bilat    Cecal cancer 2014   colon   CHF (congestive heart failure)    Chronic systolic dysfunction of left ventricle 05/21/2013   Clotting disorder    21 yrs ago blood clot in heart    Colon cancer ([place])    Colon polyp, hyperplastic    Diabetes mellitus type II, controlled 03/02/2009   Diabetic neuropathy 02/21/2018   [facility] -  [facility] [place]   Diverticulosis of colon    Diverticulosis of large intestine 08/04/2002   Ejection fraction < 50%    EF 30%, New diagnosis   february, 2011, etiology not clear, consider rate related tachycardia, and not use carvedilol, catheterization no constriction, /    EF 55-60% echo, June, 2011 /     Beta blocker stopped May, 2012 with reactive airway disease.  Patient does not tolerate metoprolol.  Carvedilol stopped  /   EF 55%, echo, February, 2012  //   EF 40-45%, septal dyssynergy, hypokinesis of the a   Essential hypertension 07/07/2008   Family history of adverse reaction to anesthesia    sister had a hard time waking up after anesthesia   Family history of colon cancer 04/29/2021   GERD (gastroesophageal reflux disease)    Heart murmur    Hemorrhoids, internal    High cholesterol    History of CABG    Hyperlipidemia 07/07/2008  Lumbar radiculopathy, acute 06/17/2018   Major depressive disorder 07/19/2018   Mitral regurgitation    a. s/p repair with LAA ligation and MAZE at time of surgery   Mitral stenosis    Mild, February, 2011, post mitral valve repair / mild, echo, June, 2011  //  Mild functional mitral stenosis, echo, February, 2012  //   mild stenosis, echo, December, 2013  //  mild, echo, August, 2014   //   Mild, echo, April, 2016    Osteopenia    left hip    Pacemaker-Medtronic 05/20/2012   Pacemaker placed September, 2013   //   upgraded to biventricular CRT pacemaker November 04, 2013    Permanent atrial fibrillation    a. s/p MAZE b. s/p AVN ablation   Personal history of colonic polyps 04/29/2021   PONV (postoperative nausea and vomiting)    Pulmonary HTN    QT prolongation    Tikosyn and Effexor. QT prolonged October 13, 2011, peak is in dose reduced from 500  to -250 twice a day   Right ventricular dysfunction    Mild to moderate, echo, February, 2011 / normalized echo, June, 2011 /  RV normal, echo, February, 2012  //   right ventricle reported as good  echo, December, 2013    S/P mitral valve repair    [facility] / [facility] procedure/ atrial appendage removed were tied off    Sick sinus syndrome    s/p Medtronic dual chamber PPM 05/17/12    Sleep difficulties 09/13/2019   Vitamin D deficiency 02/20/2014    Assessment: Patient admitted with CC of chest pain. Trop elevated to 2705. 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 Per outpatient anticoag notes (3/12) appears patient was taking 3.75mg  on Fri and 2.5mg  all other days. Pharmacy consulted to dose heparin once INR < 2.0 per cardiology.   Initial heparin level this evening came back subtherapeutic at 0.17, on heparin infusion at 750 units/hr. INR is subtherapeutic at 1.6. No s/sx of bleeding or infusion issues per nursing.   Goal of Therapy:  INR goal: 2-3 Heparin level 0.3-0.7 units/ml Monitor platelets by anticoagulation protocol: Yes   Plan:  Increase heparin infusion to 950 units/hr Order warfarin 3.75 mg tonight  Check Heparin level in 8 hours Daily heparin level and CBC while on heparin therapy  Antonietta Jewel, PharmD, BCCCP Clinical Pharmacist  Phone: [phone] 11/27/2022 4:36 PM  Please check AMION for all [place] phone numbers After 10:00 PM, call [place] [phone]

## 2022-11-28 ENCOUNTER — Inpatient Hospital Stay (HOSPITAL_COMMUNITY): Payer: Medicare HMO

## 2022-11-28 DIAGNOSIS — E876 Hypokalemia: Secondary | ICD-10-CM | POA: Diagnosis not present

## 2022-11-28 DIAGNOSIS — I2510 Atherosclerotic heart disease of native coronary artery without angina pectoris: Secondary | ICD-10-CM | POA: Diagnosis not present

## 2022-11-28 DIAGNOSIS — B3322 Viral myocarditis: Secondary | ICD-10-CM | POA: Diagnosis not present

## 2022-11-28 DIAGNOSIS — I4891 Unspecified atrial fibrillation: Secondary | ICD-10-CM | POA: Diagnosis not present

## 2022-11-28 LAB — CBC
HCT: 46.1 % — ABNORMAL HIGH (ref 36.0–46.0)
Hemoglobin: 15 g/dL (ref 12.0–15.0)
MCH: 30 pg (ref 26.0–34.0)
MCHC: 32.5 g/dL (ref 30.0–36.0)
MCV: 92.2 fL (ref 80.0–100.0)
Platelets: 175 K/uL (ref 150–400)
RBC: 5 MIL/uL (ref 3.87–5.11)
RDW: 13.1 % (ref 11.5–15.5)
WBC: 6.5 K/uL (ref 4.0–10.5)
nRBC: 0 % (ref 0.0–0.2)

## 2022-11-28 LAB — GLUCOSE, CAPILLARY
Glucose-Capillary: 89 mg/dL (ref 70–99)
Glucose-Capillary: 180 mg/dL — ABNORMAL HIGH (ref 70–99)
Glucose-Capillary: 147 mg/dL — ABNORMAL HIGH (ref 70–99)

## 2022-11-28 LAB — BASIC METABOLIC PANEL
Anion gap: 9 (ref 5–15)
BUN: 24 mg/dL — ABNORMAL HIGH (ref 8–23)
CO2: 27 mmol/L (ref 22–32)
Calcium: 9 mg/dL (ref 8.9–10.3)
Chloride: 99 mmol/L (ref 98–111)
Creatinine, Ser: 0.68 mg/dL (ref 0.44–1.00)
GFR, Estimated: >60 mL/min (ref >60)
Glucose, Bld: 105 mg/dL — ABNORMAL HIGH (ref 70–99)
Potassium: 3.7 mmol/L (ref 3.5–5.1)
Sodium: 135 mmol/L (ref 135–145)

## 2022-11-28 LAB — PROTIME-INR
INR: 1.3 — ABNORMAL HIGH (ref 0.8–1.2)
Prothrombin Time: 16.3 seconds — ABNORMAL HIGH (ref 11.4–15.2)

## 2022-11-28 LAB — HEPARIN LEVEL (UNFRACTIONATED)
Heparin Unfractionated: 0.35 [IU]/mL (ref 0.30–0.70)
Heparin Unfractionated: 0.24 [IU]/mL — ABNORMAL LOW (ref 0.30–0.70)

## 2022-11-28 MED ORDER — POTASSIUM CHLORIDE CRYS ER 20 MEQ PO TBCR
40.0000 meq | EXTENDED_RELEASE_TABLET | Freq: Once | ORAL | Status: AC
  Administered 2022-11-28: 40 meq via ORAL
  Filled 2022-11-28: qty 2

## 2022-11-28 MED ORDER — IOHEXOL 350 MG/ML SOLN
100.0000 mL | Freq: Once | INTRAVENOUS | Status: AC | PRN
  Administered 2022-11-28: 100 mL via INTRAVENOUS

## 2022-11-28 MED ORDER — NITROGLYCERIN 0.4 MG SL SUBL
SUBLINGUAL_TABLET | SUBLINGUAL | Status: AC
  Filled 2022-11-28: qty 2

## 2022-11-28 MED ORDER — METOPROLOL TARTRATE 5 MG/5ML IV SOLN
INTRAVENOUS | Status: AC
  Filled 2022-11-28: qty 5

## 2022-11-28 NOTE — Progress Notes (Addendum)
Received s/o to follow up on cor CT report. Per Dr. Percival Spanish if normal could go home but if abnormal keep for possible cath in AM. He is not on service this afternoon. Per Dr. 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 Based on prior cath, this was reported as a large vessel. It may take a bit for the FFR to come back, but this is abnormal. Cannot tell from CT if this is acute or chronic. Therefore will hold off on DC, hold off Coumadin, continue heparin per pharmacy and keep NPO after MN for possible cath in AM. Update provided to IM, pharmD and nurse - asked nurse to relay update to patient that further analysis needed before discharge since I was unable to reach her. It does not look like rounding team previously had pt on ASA (possibly given concomitant Coumadin); previously had known dx of CAD before this admission as well and was only on warfarin due to stable CAD. Need to consider adding with AM regimen depending on f/u INR and finalized CT results.  Addendum: also spoke with patient to give her update. She was appreciative of update.

## 2022-11-28 NOTE — Progress Notes (Signed)
[facility] for heparin  Indication: chest pain/ACS, MVR  Allergies  Allergen Reactions   Demerol [Meperidine] Nausea And Vomiting   Morphine Nausea And Vomiting   Januvia [Sitagliptin] Other (See Comments)    Headaches, did not feel well   Metformin And Related Diarrhea   Tetanus Toxoid Rash and Other (See Comments)    "years ago"     Vital Signs: Temp: 97.8 F (36.6 C) (03/26 0055) Temp Source: Oral (03/26 0055) BP: 113/57 (03/26 0055) Pulse Rate: 76 (03/26 0055)  Labs: Recent Labs    11/25/22 0900 11/25/22 0929 11/25/22 1125 11/25/22 1125 11/26/22 0104 11/27/22 0052 11/27/22 1558 11/28/22 0102  HGB 16.2* 16.7*  --   --  14.9  --   --  15.0  HCT 49.3* 49.0*  --   --  46.0  --   --  46.1*  PLT 204  --   --   --  175  --   --  175  LABPROT  --   --  25.9*   < > 24.1* 19.0*  --  16.3*  INR  --   --  2.4*   < > 2.2* 1.6*  --  1.3*  HEPARINUNFRC  --   --   --   --   --   --  0.17* 0.35  CREATININE 0.88 0.60  --   --  0.71 0.83  --  0.68  TROPONINIHS 3,029*  --  2,705*  --   --   --   --   --    < > = values in this interval not displayed.     Estimated Creatinine Clearance: 45.3 mL/min (by C-G formula based on SCr of 0.68 mg/dL).   Medical History: 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   Breast cancer ([place])    CAD (coronary artery disease)    LIMA to the LAD at time of mitral valve repair / LIMA atretic,, February, 2011   Cataracts, bilateral    removed bilat    Cecal cancer 2014   colon   CHF (congestive heart failure)    Chronic systolic dysfunction of left ventricle 05/21/2013   Clotting disorder    21 yrs ago blood  clot in heart    Colon cancer ([place])    Colon polyp, hyperplastic    Diabetes mellitus type II, controlled 03/02/2009   Diabetic neuropathy 02/21/2018   [facility] - [facility] [place]   Diverticulosis of colon    Diverticulosis of large intestine 08/04/2002   Ejection fraction < 50%    EF 30%, New diagnosis   february, 2011, etiology not clear, consider rate related tachycardia, and not use carvedilol, catheterization no constriction, /    EF 55-60% echo, June, 2011 /     Beta blocker stopped May, 2012 with reactive airway disease.  Patient does not tolerate metoprolol.  Carvedilol stopped  /   EF 55%, echo, February, 2012  //   EF 40-45%, septal dyssynergy, hypokinesis of the a   Essential hypertension 07/07/2008   Family history of adverse reaction to anesthesia  sister had a hard time waking up after anesthesia   Family history of colon cancer 04/29/2021   GERD (gastroesophageal reflux disease)    Heart murmur    Hemorrhoids, internal    High cholesterol    History of CABG    Hyperlipidemia 07/07/2008   Lumbar radiculopathy, acute 06/17/2018   Major depressive disorder 07/19/2018   Mitral regurgitation    a. s/p repair with LAA ligation and MAZE at time of surgery   Mitral stenosis    Mild, February, 2011, post mitral valve repair / mild, echo, June, 2011  //  Mild functional mitral stenosis, echo, February, 2012  //   mild stenosis, echo, December, 2013  //  mild, echo, August, 2014   //   Mild, echo, April, 2016    Osteopenia    left hip    Pacemaker-Medtronic 05/20/2012   Pacemaker placed September, 2013   //   upgraded to biventricular CRT pacemaker November 04, 2013    Permanent atrial fibrillation    a. s/p MAZE b. s/p AVN ablation   Personal history of colonic polyps 04/29/2021   PONV (postoperative nausea and vomiting)    Pulmonary HTN    QT prolongation    Tikosyn and Effexor. QT prolonged October 13, 2011, peak is in dose reduced from 500  to -250 twice a day   Right  ventricular dysfunction    Mild to moderate, echo, February, 2011 / normalized echo, June, 2011 /  RV normal, echo, February, 2012  //   right ventricle reported as good echo, December, 2013    S/P mitral valve repair    [facility] / [facility] procedure/ atrial appendage removed were tied off    Sick sinus syndrome    s/p Medtronic dual chamber PPM 05/17/12    Sleep difficulties 09/13/2019   Vitamin D deficiency 02/20/2014    Assessment: Patient admitted with CC of chest pain. Trop elevated to 2705. 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 Per outpatient anticoag notes (3/12) appears patient was taking 3.75mg  on Fri and 2.5mg  all other days. Pharmacy consulted to dose heparin once INR < 2.0 per cardiology.   Initial heparin level this evening came back subtherapeutic at 0.17, on heparin infusion at 750 units/hr. INR is subtherapeutic at 1.6. No s/sx of bleeding or infusion issues per nursing.    3/26 AM update:  Heparin level therapeutic  INR 1.3  Goal of Therapy:  INR goal: 2-3 Heparin level 0.3-0.7 units/ml Monitor platelets by anticoagulation protocol: Yes   Plan:  Cont heparin 950 units/hr 1200 heparin level  Narda Bonds, PharmD, BCPS Clinical Pharmacist Phone: [phone]

## 2022-11-28 NOTE — Progress Notes (Signed)
   Patient Name: Anita Duke Date of Encounter: 11/28/2022 [place] Cardiologist: Loralie Champagne, MD   Interval Summary   She ambulated in the room.  No chest pain.    Breathing is not quite at baseline.    Vital Signs   Vitals:   11/27/22 2042 11/28/22 0055 11/28/22 0417 11/28/22 0731  BP: 113/64 (!) 113/57 (!) 124/52 (!) 130/59  Pulse: 72 76 72 72  Resp: 18 18 18 18   Temp: 98 F (36.7 C) 97.8 F (36.6 C) 98.1 F (36.7 C)   TempSrc: Oral Oral Oral   SpO2: 95% 94% 96% 93%  Weight:  64 kg      Intake/Output Summary (Last 24 hours) at 11/28/2022 0818 Last data filed at 11/28/2022 0100 Gross per 24 hour  Intake 249.35 ml  Output 1950 ml  Net -1700.65 ml      11/28/2022   12:55 AM 11/27/2022    4:54 AM 11/26/2022    1:21 AM  Last 3 Weights  Weight (lbs) 141 lb 1.6 oz 139 lb 6.4 oz 140 lb 8 oz  Weight (kg) 64.003 kg 63.231 kg 63.73 kg      Telemetry/ECG    Atrial fib with ventricular pacing. . - Personally Reviewed  Physical Exam   GEN: No  acute distress.   Neck: No  JVD Cardiac: RRR, no murmurs, rubs, or gallops.  Respiratory: Clear   to auscultation bilaterally. GI: Soft, nontender, non-distended, normal bowel sounds  MS:  No edema; No deformity.  Assessment & Plan    NSTEMI:    Echo EF was 50 - 55%.  No new wall motion abnormalities.   Resume warfarin as no invasive testing is planned.    Plan CT to evaluate coronaries.  Home if no high grade lesions.   MV REPAIR:  Stable on echo.    ATRIAL FIB:    Resumed warfarin per pharmacy.  OK to go home without bridging if CT is OK.    CHRONIC SYSTOLIC HF:      Intake and output is incomplete.   Appears to be net negative I/O yesterday.   Given Lasix a couple of days ago.   She is on OMT at her previous doses.   Seems to be euvolemic.     ACUTE DYSPNEA:  Pneumonia with metapneumonia . Per primary team.      For questions or updates, please contact [place] Please consult  [email or web address] for contact info under        Signed, Minus Breeding, MD

## 2022-11-28 NOTE — Progress Notes (Signed)
[place] for heparin  Indication: chest pain/ACS, MVR  Allergies  Allergen Reactions   Demerol [Meperidine] Nausea And Vomiting   Morphine Nausea And Vomiting   Januvia [Sitagliptin] Other (See Comments)    Headaches, did not feel well   Metformin And Related Diarrhea   Tetanus Toxoid Rash and Other (See Comments)    "years ago"     Vital Signs: Temp: 98.3 F (36.8 C) (03/26 1220) Temp Source: Oral (03/26 1220) BP: 94/58 (03/26 1220) Pulse Rate: 73 (03/26 1220)  Labs: Recent Labs    11/26/22 0104 11/27/22 0052 11/27/22 1558 11/28/22 0102 11/28/22 1320  HGB 14.9  --   --  15.0  --   HCT 46.0  --   --  46.1*  --   PLT 175  --   --  175  --   LABPROT 24.1* 19.0*  --  16.3*  --   INR 2.2* 1.6*  --  1.3*  --   HEPARINUNFRC  --   --  0.17* 0.35 0.24*  CREATININE 0.71 0.83  --  0.68  --      Estimated Creatinine Clearance: 45.3 mL/min (by C-G formula based on SCr of 0.68 mg/dL).  Assessment: Patient admitted with CC of chest pain. Trop elevated to 2705. 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 Per outpatient anticoag notes (3/12) appears patient was taking 3.75mg  on Fri and 2.5mg  all other days. Pharmacy consulted to dose heparin once INR < 2.0 per cardiology.   INR down to 1.3 after coumadin restarted  yesterday evening.  Heparin level 0.24 (subtherapeutic) on infusion at 950 units/hr. No issues with line or bleeding reported per RN.  Contacted by cards PA and holding coumadin for now as may need further interventions. 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  Goal of Therapy:  INR goal 2-3 Heparin level 0.3-0.7 units/ml Monitor platelets by anticoagulation protocol: Yes   Plan:  Increase heparin to 1100 units/hr Will f/u 8hr heparin level Coumadin d/c as interventions may be needed  Sherlon Handing, PharmD, BCPS Please see amion  for complete clinical pharmacist phone list 11/28/2022 3:02 PM

## 2022-11-28 NOTE — Progress Notes (Addendum)
Progress Note   Patient: Anita Duke [id number] DOB: Jul 17, 1941 DOA: 11/25/2022     3 DOS: the patient was seen and examined on 11/28/2022   Brief hospital course: Mrs. Yonke was admitted to the hospital with the working diagnosis of myocarditis.   82 yo female with extensive cardiovascular history including coronary artery disease, atrial fibrillation and heart failure who present with upper respiratory tract symptoms that progressed to dyspnea over the last 3 days. On her physical examination she is hemodynamically stable, her lungs are clear to auscultation and has no signs of hypervolemia.  Renal function has a normal serum creatinine.  INR is therapeutic.  COVID 19 and influenza are negative.  Chest radiograph with faint opacity right upper lobe.  Significant elevation in high sensitive troponin with no EKG changes (paced rhythm).   03/24 positive for metapneumovirus. Added IV furosemide for pulmonary edema, bacterial pneumonia has been ruled out.  03/25 pending echocardiogram. 03/26 echocardiogram with no significant wall motion abnormalities, preserved LV systolic function.  Coronary CT suggestive for total occlusion of left circumflex.   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  Patient with no chest pain.  Positive for metapneumovirus.  03/24 Follow up chest radiograph personally reviewed with right upper lobe plate like atelectasis. Cardiomegaly with bilateral hilar congestion and cephalization of the vasculature.   Continue telemetry monitoring.  Echocardiogram with no significant wall motion abnormalities, with preserved LV EF, CT angio with possible occlusion of left circumflex.  Now on heparin drip for anticoagulation.  Acute on chronic diastolic heart failure, continue with carvedilol, entresto, spironolactone and SGLT 2 inh.   CAD (coronary artery disease) Patient had revascularization in the past.  She  is physically active at her baseline with no angina.  Echocardiogram with no significant wall motion abnormalities but positive CT angio for possible left Cx disease.   Continue heparin for anticoagulation.  Possible cardiac catheterization tomorrow.   Hypokalemia Today renal function with serum cr at 0,88 with K at 3,7 and serum bicarbonate at 27. Na 135 and Mg 2,0  Plan to continue K correction with oral Kcl Follow up renal function in am.  Continue diuresis with spironolactone and SGLT 2 inh.   Essential hypertension Continue blood pressure control with carvedilol, spironolactone and entresto.   Atrial fibrillation Continue rate control with carvedilol. On IV heparin drip.   Type 2 diabetes mellitus with hyperlipidemia (HCC) Continue glucose cover and monitoring with insulin sliding scale.  Patient is tolerating po well.  Glucose has been well controlled.   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        Subjective: Patient with no chest pain, she is feeling better, no edema or orthopnea.   Physical Exam: Vitals:   11/28/22 0900 11/28/22 1220 11/28/22 1556 11/28/22 1600  BP: (!) 119/56 (!) 94/58 111/60   Pulse:  73 70 70  Resp:  19 18   Temp:  98.3 F (36.8 C) 98.3 F (36.8 C)   TempSrc:  Oral Oral   SpO2:  94% 96% 95%  Weight:       Neurology awake and alert ENT with mild pallor Cardiovascular with S1 and S2 present and regular with no gallops or rubs, no murmurs No JVD No lower extremity edema Respiratory with no rales or wheezing, no rhonchi Abdomen with no distention  Data Reviewed:    Family Communication: no family at th bedside   Disposition: Status is:  Inpatient Remains inpatient appropriate because: heart failure and ischemic workup.   Planned Discharge Destination: Home    Author: Tawni Millers, MD 11/28/2022 4:59 PM  For on call review [email or web address].

## 2022-11-29 DIAGNOSIS — J129 Viral pneumonia, unspecified: Secondary | ICD-10-CM

## 2022-11-29 DIAGNOSIS — E876 Hypokalemia: Secondary | ICD-10-CM | POA: Diagnosis not present

## 2022-11-29 DIAGNOSIS — R9431 Abnormal electrocardiogram [ECG] [EKG]: Secondary | ICD-10-CM | POA: Diagnosis not present

## 2022-11-29 DIAGNOSIS — C50112 Malignant neoplasm of central portion of left female breast: Secondary | ICD-10-CM | POA: Diagnosis not present

## 2022-11-29 DIAGNOSIS — I4891 Unspecified atrial fibrillation: Secondary | ICD-10-CM | POA: Diagnosis not present

## 2022-11-29 DIAGNOSIS — I10 Essential (primary) hypertension: Secondary | ICD-10-CM | POA: Diagnosis not present

## 2022-11-29 DIAGNOSIS — I2510 Atherosclerotic heart disease of native coronary artery without angina pectoris: Secondary | ICD-10-CM | POA: Diagnosis not present

## 2022-11-29 DIAGNOSIS — B3322 Viral myocarditis: Secondary | ICD-10-CM | POA: Diagnosis not present

## 2022-11-29 LAB — CBC
HCT: 41 % (ref 36.0–46.0)
Hemoglobin: 13.6 g/dL (ref 12.0–15.0)
MCH: 29.8 pg (ref 26.0–34.0)
MCHC: 33.2 g/dL (ref 30.0–36.0)
MCV: 89.7 fL (ref 80.0–100.0)
Platelets: 155 10*3/uL (ref 150–400)
RBC: 4.57 MIL/uL (ref 3.87–5.11)
RDW: 12.9 % (ref 11.5–15.5)
WBC: 6.5 10*3/uL (ref 4.0–10.5)
nRBC: 0 % (ref 0.0–0.2)

## 2022-11-29 LAB — PROTIME-INR
INR: 1.4 — ABNORMAL HIGH (ref 0.8–1.2)
Prothrombin Time: 17.2 seconds — ABNORMAL HIGH (ref 11.4–15.2)

## 2022-11-29 LAB — BASIC METABOLIC PANEL
Anion gap: 9 (ref 5–15)
BUN: 20 mg/dL (ref 8–23)
CO2: 24 mmol/L (ref 22–32)
Calcium: 8.8 mg/dL — ABNORMAL LOW (ref 8.9–10.3)
Chloride: 102 mmol/L (ref 98–111)
Creatinine, Ser: 0.72 mg/dL (ref 0.44–1.00)
GFR, Estimated: >60 mL/min (ref >60)
Glucose, Bld: 109 mg/dL — ABNORMAL HIGH (ref 70–99)
Potassium: 4.3 mmol/L (ref 3.5–5.1)
Sodium: 135 mmol/L (ref 135–145)

## 2022-11-29 LAB — GLUCOSE, CAPILLARY
Glucose-Capillary: 112 mg/dL — ABNORMAL HIGH (ref 70–99)
Glucose-Capillary: 91 mg/dL (ref 70–99)
Glucose-Capillary: 91 mg/dL (ref 70–99)

## 2022-11-29 LAB — HEPARIN LEVEL (UNFRACTIONATED)
Heparin Unfractionated: 0.44 IU/mL (ref 0.30–0.70)
Heparin Unfractionated: 0.54 IU/mL (ref 0.30–0.70)

## 2022-11-29 MED ORDER — SPIRONOLACTONE 25 MG PO TABS
25.0000 mg | ORAL_TABLET | Freq: Every day | ORAL | Status: DC
  Administered 2022-11-29: 25 mg via ORAL
  Filled 2022-11-29: qty 1

## 2022-11-29 MED ORDER — WARFARIN SODIUM 5 MG PO TABS
5.0000 mg | ORAL_TABLET | Freq: Once | ORAL | Status: AC
  Administered 2022-11-29: 5 mg via ORAL
  Filled 2022-11-29: qty 1

## 2022-11-29 MED ORDER — EMPAGLIFLOZIN 25 MG PO TABS
25.0000 mg | ORAL_TABLET | Freq: Every day | ORAL | Status: DC
  Administered 2022-11-29: 25 mg via ORAL
  Filled 2022-11-29: qty 1

## 2022-11-29 NOTE — Discharge Summary (Signed)
Physician Discharge Summary   Patient: Anita Duke MRN: [id number] DOB: Nov 13, 1940  Admit date:     11/25/2022  Discharge date: 11/29/22  Discharge Physician: Tawni Millers   PCP: Binnie Rail, MD   Recommendations at discharge:    Patient will resume warfarin at her regular dose tomorrow, continue INR monitoring as outpatient. Follow up with Cardiology as scheduled. Follow up with Dr Quay Burow in 7 to 10 days.   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 [facility] Course: Anita Duke was admitted to the hospital with the working diagnosis of myocarditis.   82 yo female with extensive cardiovascular history including coronary artery disease, atrial fibrillation and heart failure who present with upper respiratory tract symptoms that progressed to dyspnea over the last 3 days. On her physical examination she is hemodynamically stable, her lungs are clear to auscultation and has no signs of hypervolemia.  Renal function has a normal serum creatinine.  INR is therapeutic.  COVID 19 and influenza are negative.  Chest radiograph with faint opacity right upper lobe.  Significant elevation in high sensitive troponin with no EKG changes (paced rhythm).   03/24 positive for metapneumovirus. Added IV furosemide for pulmonary edema, bacterial pneumonia has been ruled out.  03/25 pending echocardiogram. 03/26 echocardiogram with no significant wall motion abnormalities, preserved LV systolic function.  Coronary CT suggestive for total occlusion of left circumflex.   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  Patient with no  chest pain.  Positive for metapneumovirus.  03/24 Follow up chest radiograph personally reviewed with right upper lobe plate like atelectasis. Cardiomegaly with bilateral hilar congestion and cephalization of the vasculature.   Echocardiogram with no significant wall motion abnormalities, with preserved LV EF,.   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 There is no graft known to be attached to aorta but almost has appearance of coronary graft anastomosis. Concerning for ulcer. No contrast visualized within structure.  Acute on chronic diastolic heart failure, continue with carvedilol, entresto, spironolactone and SGLT 2 inh.  Patient received one dose of IV furosemide during her hospitalization.  Follow up with the heart failure clinic as outpatient.   Viral pneumonia CT chest with ground glass opacities and tree in bud nodules, suggestive viral pneumonia.  Bacterial pneumonia was ruled out and antibiotic were discontinued.  At the time of her discharge he 02 saturation is 94% on room air.   CAD (coronary artery disease) Patient had revascularization in the past.  She is physically active at her baseline with no angina.  Echocardiogram with no significant wall motion abnormalities but positive CT angio complete occlusion and distal reconstitution likely collaterals, at the proximal circumflex.   Patient was placed on heparin drip, when INR was subtherapeutic.  Cardiology was consulted and recommended continue medical therapy and close follow up as outpatient.  If symptoms are recurrent may need coronary angiography.   Hypokalemia Electrolytes were corrected.  At the time of her discharge her renal function had a serum cr of 0,72, with K at 4,3  and serum bicarbonate at 24, Na 135, Mg 2.0   Continue diuresis with spironolactone and SGLT 2  inh.  Follow up renal function and electrolytes as outpatient.   Essential hypertension Continue blood pressure control with carvedilol, spironolactone and entresto.   Atrial fibrillation Continue rate control with carvedilol. Resume warfarin Her discharge INR is 1,4, follow up as outpatient.   Type 2 diabetes mellitus with hyperlipidemia (HCC) Patient was placed on insulin sliding scale for glucose cover and monitoring.  Her glucose remained stable with fasting glucose at the time of her discharge at 109 mg/dl.    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         Consultants: cardiology  Procedures performed: none   Disposition: Home Diet recommendation:  Discharge Diet Orders (From admission, onward)     Start     Ordered   11/29/22 0000  Diet - low sodium heart healthy        11/29/22 1246           Cardiac and Carb modified diet DISCHARGE MEDICATION: Allergies as of 11/29/2022       Reactions   Demerol [meperidine] Nausea And Vomiting   Morphine Nausea And Vomiting   Januvia [sitagliptin] Other (See Comments)   Headaches, did not feel well   Metformin And Related Diarrhea   Tetanus Toxoid Rash, Other (See Comments)   "years ago"        Medication List     TAKE these medications    amoxicillin 500 MG capsule Commonly known as: AMOXIL TAKE 4 CAPSULES PRIOR TO DENTAL APPOINTMENTS AS DIRECTED   atorvastatin 40 MG tablet Commonly known as: LIPITOR Take 1 tablet (40 mg total) by mouth daily.   carvedilol 12.5 MG tablet Commonly known as: COREG Take 1 tablet (12.5 mg total) by mouth 2 (two) times daily with a meal.   cetirizine 10 MG tablet Commonly known as: ZYRTEC Take 10 mg by mouth daily.   cholecalciferol 1000 units tablet Commonly known as: VITAMIN D Take 1,000 Units by mouth daily.   cyanocobalamin 500 MCG tablet Commonly known as: VITAMIN B12 Take 500 mcg by mouth daily.   empagliflozin  25 MG Tabs tablet Commonly known as: Jardiance Take 1 tablet (25 mg total) by mouth daily.   Entresto 24-26 MG Generic drug: sacubitril-valsartan TAKE 1 TABLET TWICE A DAY   fish oil-omega-3 fatty acids 1000 MG capsule Take 1 g by mouth 2 (two) times daily.   sodium chloride 0.65 % Soln nasal spray Commonly known as: OCEAN Place 1 spray into both nostrils 2 (two) times daily.   spironolactone 25 MG tablet Commonly known as: ALDACTONE Take 1 tablet (25 mg total) by mouth daily.   Systane 0.4-0.3 % Soln Generic drug: Polyethyl Glycol-Propyl Glycol Place 1 drop into both eyes daily.   tamoxifen 20 MG tablet Commonly known as: NOLVADEX Take 1 tablet (20 mg total) by mouth daily.   venlafaxine XR 37.5 MG 24 hr capsule Commonly known as: EFFEXOR-XR Take 1 capsule (37.5 mg total) by mouth daily.   warfarin 2.5 MG tablet Commonly known as: COUMADIN Take as directed. If you are unsure how to take this medication, talk to your nurse or doctor. Original instructions: TAKE 1 TABLET BY MOUTH DAILY OR AS DIRECTED BY ANTICOAGULATION  CLINIC What changed:  how much to take how to take this when to take this additional instructions        Follow-up Information  Larey Dresser, MD Follow up.   Specialty: Cardiology Why: Keep follow-up as scheduled with Dr. Aundra Dubin in the Heart Failure clinic on Monday Jan 22, 2023 at 10:00 AM (arrive 15 minutes early to check in). We cancelled the echocardiogram earlier that day since you had it in the hospital. Contact information: [place] [address] [phone]                Discharge Exam: Danley Danker Weights   11/27/22 0454 11/28/22 0055 11/29/22 0523  Weight: 63.2 kg 64 kg 63.5 kg   BP (!) 129/53 (BP Location: Right Arm)   Pulse 70   Temp 97.9 F (36.6 C) (Oral)   Resp 18   Wt 63.5 kg   SpO2 95%   BMI 27.34 kg/m   Patient is feeling better, no chest pain or dyspnea, weakness is improving.    Neurology awake and alert ENT with mild pallor Cardiovascular with S1 and S2 present and rhythmic with no gallops, rubs or murmurs Respiratory with no rales or wheezing, no rhonchi Abdomen with no distention  No lower extremity edema   Condition at discharge: stable  The results of significant diagnostics from this hospitalization (including imaging, microbiology, ancillary and laboratory) are listed below for reference.   Imaging Studies: CT CORONARY MORPH W/CTA COR W/SCORE W/CA W/CM &/OR WO/CM  Addendum Date: 11/28/2022   ADDENDUM REPORT: 11/28/2022 13:37 EXAM: OVER-READ INTERPRETATION  CT CHEST The following report is an over-read performed by radiologist Dr. Judithann Sauger [facility] on 11/28/2022. This over-read does not include interpretation of cardiac or coronary anatomy or pathology. The coronary CTA interpretation by the cardiologist is attached. COMPARISON:  CT chest dated 11/19/2012 FINDINGS: Cardiovascular: Left chest wall pacemaker leads terminate in the right atrium, right ventricle, and tributary of the coronary sinus. Normal appearance of extracardiac vascular structures. Aortic atherosclerosis. Mediastinum/Nodes: Normal esophagus. No pathologically enlarged mediastinal or hilar lymph nodes. Lungs/Pleura: The central airways are patent. Layering secretions within the distal trachea extending into the left main bronchus. Diffuse bronchial wall thickening with subsegmental mucous plugging in the bilateral lower lobes. Ground-glass and tree-in-bud nodules in the right upper lobe. Bibasilar subsegmental atelectasis. No pneumothorax. No pleural effusion. Upper abdomen: Unchanged subcentimeter hepatic segment 2 hypodensity (10:116), too small to characterize but likely benign. Musculoskeletal:  Median sternotomy wires are nondisplaced. IMPRESSION: 1. Ground-glass and tree-in-bud nodules in the right upper lobe, likely infectious or inflammatory. 2. Layering secretions within the  distal trachea extending into the left main bronchus with diffuse bronchial wall thickening and subsegmental mucous plugging in the bilateral lower lobes. 3. Aortic Atherosclerosis (ICD10-I70.0). Electronically Signed   By: Darrin Nipper M.D.   On: 11/28/2022 13:37   Result Date: 11/28/2022 HISTORY: Chest pain, nonspecific EXAM: Cardiac/Coronary CT TECHNIQUE: The patient was scanned on a [employer]. PROTOCOL: A 120 kV prospective scan was triggered in the descending thoracic aorta at 111 HU's. Axial non-contrast 3 mm slices were carried out through the heart. The data set was analyzed on a dedicated work station and scored using the Agatston method. Gantry rotation speed was 250 msecs and collimation was 0.6 mm. Heart rate was optimized medically and sl NTG was given. The 3D data set was reconstructed in 5% intervals of the 35-75 % of the R-R cycle. Systolic and diastolic phases were analyzed on a dedicated work station using MPR, MIP and VRT modes. FINDINGS: Coronary calcium score: The patient's coronary artery calcium score is 568, which places the patient  in the 79th percentile. Coronary arteries: Normal coronary origins.  Right dominance. Right Coronary Artery: Normal caliber vessel, gives rise to PDA. Scattered calcified plaque with maximum 1-24% stenosis. Left Main Coronary Artery: Normal caliber vessel. No significant plaque or stenosis. Left Anterior Descending Coronary Artery: Normal caliber vessel. Proximal vessel with mixed calcified and noncalcified plaque with 50-69% stenosis. Gives rise to large first diagonal branch without significant stenosis. Left Circumflex Artery: Normal caliber vessel. No significant plaque or stenosis in visualized vessel. There is an abrupt cutoff in the proximal LCX concerning for total occlusion. No OM branches visualized. There is slight distal reconstitution, likely from collaterals though these are not well visualized There are surgical clips noted along an  atretic LIMA graft. There is no flow seen within vessel. Aorta: Normal size, 28 mm at the mid ascending aorta (level of the PA bifurcation) measured double oblique. Aortic atherosclerosis. No dissection seen in visualized portions of the aorta. There is an area on the proximal ascending aorta with outpouching. There is no graft known to be attached to aorta but almost has appearance of coronary graft anastomosis. Concerning for ulcer. No contrast visualized within structure. Aortic Valve: No calcifications. Trileaflet. Other findings: Normal pulmonary vein drainage into the left atrium. Atrial appendage not visualized. Normal size of the pulmonary artery. Normal appearance of the pericardium. Mitral annular calcification. There appears to be a small PFO, but there is no clear shunting visualized. Pacer wires noted in RA,RV, and coronary sinus. IMPRESSION: 1. Total coronary occlusion, CADRADS=5. Proximal left circumflex with complete occlusion and distal reconstitution likely by collaterals. CT FFR will be performed and reported separately. 2. Coronary calcium score of 568. This was 79th percentile for age and sex matched control. 3. Normal coronary origin with right dominance. 4. Atretic LIMA graft without apparent flow. Surgical clips suggest pedicle LIMA but not well visualized. 5.  CRT pacemaker device in place. 6. There is an area on the ascending aorta with outpouching. There is no graft known to be attached to aorta but almost has appearance of coronary graft anastomosis. Concerning for ulcer. No contrast visualized within structure. Findings reported to ordering provider. INTERPRETATION: CAD-RADS 5: Total coronary occlusion (100%). Consider cardiac catheterization or viability assessment. Consider symptom-guided anti-ischemic pharmacotherapy as well as risk factor modification per guideline directed care. Electronically Signed: By: Buford Dresser M.D. On: 11/28/2022 13:01   ECHOCARDIOGRAM  COMPLETE  Result Date: 11/27/2022    ECHOCARDIOGRAM REPORT   Patient Name:   TITYANA NUDO Date of Exam: 11/27/2022 Medical Rec #:  [id number]             Height:       60.0 in Accession #:    [id number]            Weight:       139.4 lb Date of Birth:  08-Jan-1941              BSA:          62.601 m Patient Age:    50 years              BP:           116/60 mmHg Patient Gender: F                     HR:           70 bpm. Exam Location:  Inpatient Procedure: 2D Echo, Cardiac Doppler and Color Doppler Indications:    Chest  Pain R07.9  History:        Patient has prior history of Echocardiogram examinations, most                 recent 09/02/2021. CHF, CAD, Pacemaker and Prior CABG,                 Arrythmias:Atrial Fibrillation; Risk Factors:Hypertension,                 Diabetes and Dyslipidemia. Breast Cancer of the Left breast.  Sonographer:    Ronny Flurry Referring Phys: [place]  1. Left ventricular ejection fraction, by estimation, is 50 to 55%. The left ventricle has low normal function. The left ventricle demonstrates regional wall motion abnormalities (see scoring diagram/findings for description). There is mild concentric left ventricular hypertrophy. Left ventricular diastolic parameters are indeterminate. There is akinesis of the left ventricular, basal-mid inferior wall and inferolateral wall.  2. Right ventricular systolic function is moderately reduced. The right ventricular size is normal.  3. Left atrial size was severely dilated.  4. Right atrial size was moderately dilated.  5. MV mean gradient stable. The mitral valve has been repaired/replaced. Trivial mitral valve regurgitation. No evidence of mitral stenosis. The mean mitral valve gradient is 3.0 mmHg. Moderate mitral annular calcification. Echo findings are consistent with normal structure and function of the mitral valve prosthesis.  6. The aortic valve is tricuspid. There is mild calcification of the aortic  valve. Aortic valve regurgitation is trivial. Aortic valve sclerosis/calcification is present, without any evidence of aortic stenosis. Aortic valve area, by VTI measures 2.40 cm. Aortic valve mean gradient measures 3.0 mmHg. Aortic valve Vmax measures 1.11 m/s.  7. The inferior vena cava is normal in size with greater than 50% respiratory variability, suggesting right atrial pressure of 3 mmHg. FINDINGS  Left Ventricle: Left ventricular ejection fraction, by estimation, is 50 to 55%. The left ventricle has low normal function. The left ventricle demonstrates regional wall motion abnormalities. The left ventricular internal cavity size was normal in size. There is mild concentric left ventricular hypertrophy. Abnormal (paradoxical) septal motion consistent with post-operative status. Left ventricular diastolic function could not be evaluated due to indeterminate diastolic function. Left ventricular diastolic parameters are indeterminate. Right Ventricle: The right ventricular size is normal. No increase in right ventricular wall thickness. Right ventricular systolic function is moderately reduced. Left Atrium: Left atrial size was severely dilated. Right Atrium: Right atrial size was moderately dilated. Pericardium: There is no evidence of pericardial effusion. Mitral Valve: MV mean gradient stable. The mitral valve has been repaired/replaced. Moderate mitral annular calcification. Trivial mitral valve regurgitation. There is a prosthetic annuloplasty ring present in the mitral position. Echo findings are consistent with normal structure and function of the mitral valve prosthesis. No evidence of mitral valve stenosis. MV peak gradient, 8.1 mmHg. The mean mitral valve gradient is 3.0 mmHg. Tricuspid Valve: The tricuspid valve is normal in structure. Tricuspid valve regurgitation is mild . No evidence of tricuspid stenosis. Aortic Valve: The aortic valve is tricuspid. There is mild calcification of the aortic valve.  Aortic valve regurgitation is trivial. Aortic valve sclerosis/calcification is present, without any evidence of aortic stenosis. Aortic valve mean gradient measures 3.0 mmHg. Aortic valve peak gradient measures 4.9 mmHg. Aortic valve area, by VTI measures 2.40 cm. Pulmonic Valve: The pulmonic valve was normal in structure. Pulmonic valve regurgitation is not visualized. No evidence of pulmonic stenosis. Aorta: The aortic root is normal in size and  structure. Venous: The inferior vena cava is normal in size with greater than 50% respiratory variability, suggesting right atrial pressure of 3 mmHg. IAS/Shunts: No atrial level shunt detected by color flow Doppler. Additional Comments: A device lead is visualized.  LEFT VENTRICLE PLAX 2D LVIDd:         4.20 cm LVIDs:         3.28 cm LV PW:         1.40 cm LV IVS:        1.07 cm LVOT diam:     2.10 cm LV SV:         48 LV SV Index:   30 LVOT Area:     3.46 cm  RIGHT VENTRICLE            IVC RV S prime:     9.32 cm/s  IVC diam: 1.70 cm TAPSE (M-mode): 1.8 cm LEFT ATRIUM              Index        RIGHT ATRIUM           Index LA diam:        6.00 cm  3.75 cm/m   RA Area:     25.30 cm LA Vol (A2C):   78.5 ml  49.05 ml/m  RA Volume:   73.80 ml  46.09 ml/m LA Vol (A4C):   89.0 ml  55.59 ml/m LA Biplane Vol: 111.0 ml 69.33 ml/m  AORTIC VALVE AV Area (Vmax):    2.70 cm AV Area (Vmean):   2.53 cm AV Area (VTI):     2.40 cm AV Vmax:           111.00 cm/s AV Vmean:          73.400 cm/s AV VTI:            0.200 m AV Peak Grad:      4.9 mmHg AV Mean Grad:      3.0 mmHg LVOT Vmax:         86.57 cm/s LVOT Vmean:        53.600 cm/s LVOT VTI:          0.139 m LVOT/AV VTI ratio: 0.69  AORTA Ao Root diam: 3.20 cm Ao Asc diam:  2.90 cm MITRAL VALVE                TRICUSPID VALVE MV Area VTI:  1.35 cm      TR Peak grad:   18.5 mmHg MV Peak grad: 8.1 mmHg      TR Vmax:        215.00 cm/s MV Mean grad: 3.0 mmHg MV Vmax:      1.42 m/s      SHUNTS MV Vmean:     71.0 cm/s     Systemic  VTI:  0.14 m MV E velocity: 121.00 cm/s  Systemic Diam: 2.10 cm Glori Bickers MD Electronically signed by Glori Bickers MD Signature Date/Time: 11/27/2022/2:01:50 PM    Final    DG Chest 1 View  Result Date: 11/26/2022 CLINICAL DATA:  Dyspnea. EXAM: CHEST  1 VIEW COMPARISON:  November 25, 2022. FINDINGS: Stable cardiomegaly. Sternotomy wires are noted. Left-sided pacemaker is unchanged in position. Left lung is clear. Stable right midlung opacity is noted suggesting inflammation or atelectasis. The visualized skeletal structures are unremarkable. IMPRESSION: Stable right midlung opacity concerning for inflammation or atelectasis. Followup PA and lateral chest X-ray is recommended in 3-4 weeks following trial of  antibiotic therapy to ensure resolution and exclude underlying malignancy. Electronically Signed   By: Marijo Conception M.D.   On: 11/26/2022 08:33   DG Chest 2 View  Result Date: 11/25/2022 CLINICAL DATA:  Table formatting from the original note was not included. Images from the original note were not included. Pt reports she has been feeling weak x yesterday. Pt reports she has not been able to walk due to weakness. EXAM: CHEST - 2 VIEW COMPARISON:  11/05/2013 FINDINGS: Focal right mid lung airspace disease. Stable left subclavian transvenous pacemaker. Stable cardiomegaly.  Aortic Atherosclerosis (ICD10-170.0). No effusion. Sternotomy wires. IMPRESSION: 1. Focal right mid lung airspace disease. 2. Stable cardiomegaly. Electronically Signed   By: Lucrezia Europe M.D.   On: 11/25/2022 09:44   MM DIAG BREAST TOMO BILATERAL  Result Date: 11/15/2022 CLINICAL DATA:  Left lumpectomy.  Surgical excision on the right. EXAM: DIGITAL DIAGNOSTIC BILATERAL MAMMOGRAM WITH TOMOSYNTHESIS TECHNIQUE: Bilateral digital diagnostic mammography and breast tomosynthesis was performed. COMPARISON:  Previous exam(s). ACR Breast Density Category c: The breasts are heterogeneously dense, which may obscure small masses.  FINDINGS: No suspicious masses, calcifications, or distortion are identified in either breast. Postsurgical changes in both breasts appears expected. IMPRESSION: No mammographic evidence of malignancy. RECOMMENDATION: Annual diagnostic mammography. I have discussed the findings and recommendations with the patient. If applicable, a reminder letter will be sent to the patient regarding the next appointment. BI-RADS CATEGORY  2: Benign. Electronically Signed   By: Dorise Bullion III M.D.   On: 11/15/2022 11:07   Microbiology: Results for orders placed or performed during the hospital encounter of 11/25/22  Resp panel by RT-PCR (RSV, Flu A&B, Covid) Anterior Nasal Swab     Status: None   Collection Time: 11/25/22  9:00 AM   Specimen: Anterior Nasal Swab  Result Value Ref Range Status   SARS Coronavirus 2 by RT PCR NEGATIVE NEGATIVE Final   Influenza A by PCR NEGATIVE NEGATIVE Final   Influenza B by PCR NEGATIVE NEGATIVE Final    Comment: (NOTE) The Xpert Xpress SARS-CoV-2/FLU/RSV plus assay is intended as an aid in the diagnosis of influenza from Nasopharyngeal swab specimens and should not be used as a sole basis for treatment. Nasal washings and aspirates are unacceptable for Xpert Xpress SARS-CoV-2/FLU/RSV testing.  Fact Sheet for Patients: EntrepreneurPulse.com.au  Fact Sheet for Healthcare Providers: IncredibleEmployment.be  This test is not yet approved or cleared by the [place] FDA and has been authorized for detection and/or diagnosis of SARS-CoV-2 by FDA under an Emergency Use Authorization (EUA). This EUA will remain in effect (meaning this test can be used) for the duration of the COVID-19 declaration under Section 564(b)(1) of the Act, 21 U.S.C. section 360bbb-3(b)(1), unless the authorization is terminated or revoked.     Resp Syncytial Virus by PCR NEGATIVE NEGATIVE Final    Comment: (NOTE) Fact Sheet for  Patients: EntrepreneurPulse.com.au  Fact Sheet for Healthcare Providers: IncredibleEmployment.be  This test is not yet approved or cleared by the [place] FDA and has been authorized for detection and/or diagnosis of SARS-CoV-2 by FDA under an Emergency Use Authorization (EUA). This EUA will remain in effect (meaning this test can be used) for the duration of the COVID-19 declaration under Section 564(b)(1) of the Act, 21 U.S.C. section 360bbb-3(b)(1), unless the authorization is terminated or revoked.  Performed at [facility], [address], [place], [place] [address]   Respiratory (~20 pathogens) panel by PCR     Status: Abnormal  Collection Time: 11/25/22  9:00 AM   Specimen: Nasopharyngeal Swab; Respiratory  Result Value Ref Range Status   Adenovirus NOT DETECTED NOT DETECTED Final   Coronavirus 229E NOT DETECTED NOT DETECTED Final    Comment: (NOTE) The Coronavirus on the Respiratory Panel, DOES NOT test for the novel  Coronavirus (2019 nCoV)    Coronavirus HKU1 NOT DETECTED NOT DETECTED Final   Coronavirus NL63 NOT DETECTED NOT DETECTED Final   Coronavirus OC43 NOT DETECTED NOT DETECTED Final   Metapneumovirus DETECTED (A) NOT DETECTED Final   Rhinovirus / Enterovirus NOT DETECTED NOT DETECTED Final   Influenza A NOT DETECTED NOT DETECTED Final   Influenza B NOT DETECTED NOT DETECTED Final   Parainfluenza Virus 1 NOT DETECTED NOT DETECTED Final   Parainfluenza Virus 2 NOT DETECTED NOT DETECTED Final   Parainfluenza Virus 3 NOT DETECTED NOT DETECTED Final   Parainfluenza Virus 4 NOT DETECTED NOT DETECTED Final   Respiratory Syncytial Virus NOT DETECTED NOT DETECTED Final   Bordetella pertussis NOT DETECTED NOT DETECTED Final   Bordetella Parapertussis NOT DETECTED NOT DETECTED Final   Chlamydophila pneumoniae NOT DETECTED NOT DETECTED Final   Mycoplasma pneumoniae NOT DETECTED NOT DETECTED Final    Comment: Performed at  [facility], [place]. [address], [place], [place] [address]   *Note: Due to a large number of results and/or encounters for the requested time period, some results have not been displayed. A complete set of results can be found in Results Review.    Labs: CBC: Recent Labs  Lab 11/25/22 0900 11/25/22 0929 11/26/22 0104 11/28/22 0102 11/29/22 0051  WBC 6.5  --  7.3 6.5 6.5  NEUTROABS 4.4  --   --   --   --   HGB 16.2* 16.7* 14.9 15.0 13.6  HCT 49.3* 49.0* 46.0 46.1* 41.0  MCV 93.7  --  92.6 92.2 89.7  PLT 204  --  175 175 [id number]   Basic Metabolic Panel: Recent Labs  Lab 11/25/22 0900 11/25/22 0929 11/26/22 0104 11/27/22 0052 11/28/22 0102 11/29/22 0051  NA 137 139 139 134* 135 135  K 3.7 4.4 4.1 3.4* 3.7 4.3  CL 105 108 106 99 99 102  CO2 17*  --  24 27 27 24   GLUCOSE 243* 240* 114* 119* 105* 109*  BUN 20 30* 22 27* 24* 20  CREATININE 0.88 0.60 0.71 0.83 0.68 0.72  CALCIUM 8.7*  --  9.0 8.6* 9.0 8.8*  MG  --   --   --  2.0  --   --    Liver Function Tests: Recent Labs  Lab 11/25/22 0900  AST 66*  ALT 34  ALKPHOS 55  BILITOT 1.5*  PROT 6.7  ALBUMIN 3.9   CBG: Recent Labs  Lab 11/28/22 1541 11/28/22 2126 11/29/22 0610 11/29/22 1113 11/29/22 1127  GLUCAP 180* 147* 112* 91 91    Discharge time spent: greater than 30 minutes.  Signed: Tawni Millers, MD [facility] 11/29/2022

## 2022-11-29 NOTE — Care Management Important Message (Signed)
Important Message  Patient Details  Name: Anita Duke MRN: [id number] Date of Birth: [birth year]   Medicare Important Message Given:  Yes     Shelda Altes 11/29/2022, 9:03 AM

## 2022-11-29 NOTE — Progress Notes (Signed)
[place] for heparin  Indication: chest pain/ACS and Afib Brief A/P: Heparin level within goal range Continue Heparin at current rate  Allergies  Allergen Reactions   Demerol [Meperidine] Nausea And Vomiting   Morphine Nausea And Vomiting   Januvia [Sitagliptin] Other (See Comments)    Headaches, did not feel well   Metformin And Related Diarrhea   Tetanus Toxoid Rash and Other (See Comments)    "years ago"     Vital Signs: Temp: 98.5 F (36.9 C) (03/26 2335) Temp Source: Oral (03/26 2335) BP: 108/57 (03/26 2335) Pulse Rate: 75 (03/26 2335)  Labs: Recent Labs    11/27/22 0052 11/27/22 1558 11/28/22 0102 11/28/22 1320 11/29/22 0051  HGB  --   --  15.0  --  13.6  HCT  --   --  46.1*  --  41.0  PLT  --   --  175  --  155  LABPROT 19.0*  --  16.3*  --  17.2*  INR 1.6*  --  1.3*  --  1.4*  HEPARINUNFRC  --    < > 0.35 0.24* 0.44  CREATININE 0.83  --  0.68  --   --    < > = values in this interval not displayed.     Estimated Creatinine Clearance: 45.3 mL/min (by C-G formula based on SCr of 0.68 mg/dL).  Assessment: 82 y.o. female with h/o Afib, Coumadin on hold while awaiting poss cath, for heparin  Goal of Therapy:  Heparin level 0.3-0.7 units/ml Monitor platelets by anticoagulation protocol: Yes   Plan:  No change to heparin    Phillis Knack, PharmD, BCPS 11/29/2022 1:41 AM

## 2022-11-29 NOTE — Consult Note (Signed)
   [facility] CM Inpatient Consult   11/29/2022  Tvisha Dotzler 1941/08/12 [id number]  [facility] [[facility]] Patient: Anita Duke Medicare   Primary Care Provider:  Binnie Rail, MD with  at [facility] which is listed for the [facility] follow up   Patient screened and discussed in unit progression meeting for hospitalization with noted medium risk score for unplanned readmission risk to assess for potential [place] Management service needs for post hospital transition for care coordination.  Review of patient's electronic medical record reveals patient is low risk on SDOH, no needs as discussed in meeting.   Plan:  Continue to follow progress and disposition to assess for post hospital community care coordination/management needs.  Referral request for community care coordination: TOC community to provide the [facility] follow up  Of note, [place] does not replace or interfere with any arrangements made by the Inpatient Transition of Care team.  For questions contact:   Natividad Brood, RN BSN [place]  [phone] business mobile phone Toll free office [phone]  *[place]  [phone] Fax number: [phone] [email or web address]

## 2022-11-29 NOTE — Assessment & Plan Note (Signed)
CT chest with ground glass opacities and tree in bud nodules, suggestive viral pneumonia.  Bacterial pneumonia was ruled out and antibiotic were discontinued.  At the time of her discharge he 02 saturation is 94% on room air.

## 2022-11-29 NOTE — Plan of Care (Signed)

## 2022-11-29 NOTE — TOC Transition Note (Signed)
Transition of Care [facility]) - CM/SW Discharge Note   Patient Details  Name: Anita Duke MRN: [id number] Date of Birth: 09/06/1940  [facility] CM/SW Contact:  Zenon Mayo, RN Phone Number: 11/29/2022, 12:32 PM   Clinical Narrative:    From home alone, NSTEMI, for poss dc today, has no needs.           Patient Goals and CMS Choice      Discharge Placement                         Discharge Plan and Services Additional resources added to the After Visit Summary for                                       Social Determinants of Health (SDOH) Interventions SDOH Screenings   Food Insecurity: No Food Insecurity (11/25/2022)  Housing: Low Risk  (11/25/2022)  Transportation Needs: No Transportation Needs (11/25/2022)  Utilities: Not At Risk (11/25/2022)  Alcohol Screen: Low Risk  (06/26/2022)  Depression (PHQ2-9): Low Risk  (10/02/2022)  Financial Resource Strain: Low Risk  (06/26/2022)  Physical Activity: Sufficiently Active (06/26/2022)  Social Connections: Socially Integrated (06/26/2022)  Stress: No Stress Concern Present (06/26/2022)  Tobacco Use: Low Risk  (11/27/2022)     Readmission Risk Interventions     No data to display

## 2022-11-29 NOTE — Progress Notes (Addendum)
Progress Note  Patient Name: Anita Duke Date of Encounter: 11/29/2022  Primary Cardiologist: Loralie Champagne, MD  Subjective   Feeling much better day by day. Cough getting better. No SOB or chest pain. Discussed cor CT, need dispo from Dr. Percival Spanish re: cath.  Inpatient Medications    Scheduled Meds:  atorvastatin  40 mg Oral Daily   carvedilol  12.5 mg Oral BID WC   cholecalciferol  1,000 Units Oral Daily   cyanocobalamin  500 mcg Oral Daily   empagliflozin  25 mg Oral Daily   insulin aspart  0-9 Units Subcutaneous TID WC   loratadine  10 mg Oral Daily   omega-3 acid ethyl esters  1 g Oral BID   sacubitril-valsartan  1 tablet Oral BID   spironolactone  25 mg Oral Daily   tamoxifen  20 mg Oral Daily   venlafaxine XR  37.5 mg Oral Daily   Continuous Infusions:  heparin 1,100 Units/hr (11/29/22 0602)   PRN Meds: acetaminophen **OR** acetaminophen, guaiFENesin-dextromethorphan, ondansetron **OR** ondansetron (ZOFRAN) IV, mouth rinse   Vital Signs    Vitals:   11/28/22 1937 11/28/22 2335 11/29/22 0523 11/29/22 0730  BP: 133/60 (!) 108/57 (!) 135/54 (!) 145/61  Pulse: 70 75 73 70  Resp: 18 18 18 16   Temp: 98.5 F (36.9 C) 98.5 F (36.9 C) 97.9 F (36.6 C) 98.2 F (36.8 C)  TempSrc: Oral Oral Oral Oral  SpO2: 94% 94% 93% 94%  Weight:   63.5 kg     Intake/Output Summary (Last 24 hours) at 11/29/2022 0815 Last data filed at 11/29/2022 0602 Gross per 24 hour  Intake 846.41 ml  Output 650 ml  Net 196.41 ml      11/29/2022    5:23 AM 11/28/2022   12:55 AM 11/27/2022    4:54 AM  Last 3 Weights  Weight (lbs) 140 lb 141 lb 1.6 oz 139 lb 6.4 oz  Weight (kg) 63.504 kg 64.003 kg 63.231 kg     Telemetry    Biv paced - Personally Reviewed  ECG    No new tracings - Personally Reviewed  Physical Exam   GEN: No acute distress.  HEENT: Normocephalic, atraumatic, sclera non-icteric. Neck: No JVD or bruits. Cardiac: RRR no murmurs, rubs, or gallops.   Respiratory: Clear to auscultation bilaterally. Breathing is unlabored. GI: Soft, nontender, non-distended, BS +x 4. MS: no deformity. Extremities: No clubbing or cyanosis. No edema. Distal pedal pulses are 2+ and equal bilaterally. Neuro:  AAOx3. Follows commands. Psych:  Responds to questions appropriately with a normal affect.  Labs    High Sensitivity Troponin:   Recent Labs  Lab 11/25/22 0900 11/25/22 1125  TROPONINIHS 3,029* 2,705*      Cardiac EnzymesNo results for input(s): "TROPONINI" in the last 168 hours. No results for input(s): "TROPIPOC" in the last 168 hours.   Chemistry Recent Labs  Lab 11/25/22 0900 11/25/22 0929 11/27/22 0052 11/28/22 0102 11/29/22 0051  NA 137   < > 134* 135 135  K 3.7   < > 3.4* 3.7 4.3  CL 105   < > 99 99 102  CO2 17*   < > 27 27 24   GLUCOSE 243*   < > 119* 105* 109*  BUN 20   < > 27* 24* 20  CREATININE 0.88   < > 0.83 0.68 0.72  CALCIUM 8.7*   < > 8.6* 9.0 8.8*  PROT 6.7  --   --   --   --  ALBUMIN 3.9  --   --   --   --   AST 66*  --   --   --   --   ALT 34  --   --   --   --   ALKPHOS 55  --   --   --   --   BILITOT 1.5*  --   --   --   --   GFRNONAA >60   < > >60 >60 >60  ANIONGAP 15   < > 8 9 9    < > = values in this interval not displayed.     Hematology Recent Labs  Lab 11/26/22 0104 11/28/22 0102 11/29/22 0051  WBC 7.3 6.5 6.5  RBC 4.97 5.00 4.57  HGB 14.9 15.0 13.6  HCT 46.0 46.1* 41.0  MCV 92.6 92.2 89.7  MCH 30.0 30.0 29.8  MCHC 32.4 32.5 33.2  RDW 13.4 13.1 12.9  PLT 175 175 155    BNPNo results for input(s): "BNP", "PROBNP" in the last 168 hours.   DDimer No results for input(s): "DDIMER" in the last 168 hours.   Radiology    CT CORONARY MORPH W/CTA COR W/SCORE W/CA W/CM &/OR WO/CM  Addendum Date: 11/28/2022   ADDENDUM REPORT: 11/28/2022 13:37 EXAM: OVER-READ INTERPRETATION  CT CHEST The following report is an over-read performed by radiologist Dr. Judithann Sauger [facility] on  11/28/2022. This over-read does not include interpretation of cardiac or coronary anatomy or pathology. The coronary CTA interpretation by the cardiologist is attached. COMPARISON:  CT chest dated 11/19/2012 FINDINGS: Cardiovascular: Left chest wall pacemaker leads terminate in the right atrium, right ventricle, and tributary of the coronary sinus. Normal appearance of extracardiac vascular structures. Aortic atherosclerosis. Mediastinum/Nodes: Normal esophagus. No pathologically enlarged mediastinal or hilar lymph nodes. Lungs/Pleura: The central airways are patent. Layering secretions within the distal trachea extending into the left main bronchus. Diffuse bronchial wall thickening with subsegmental mucous plugging in the bilateral lower lobes. Ground-glass and tree-in-bud nodules in the right upper lobe. Bibasilar subsegmental atelectasis. No pneumothorax. No pleural effusion. Upper abdomen: Unchanged subcentimeter hepatic segment 2 hypodensity (10:116), too small to characterize but likely benign. Musculoskeletal:  Median sternotomy wires are nondisplaced. IMPRESSION: 1. Ground-glass and tree-in-bud nodules in the right upper lobe, likely infectious or inflammatory. 2. Layering secretions within the distal trachea extending into the left main bronchus with diffuse bronchial wall thickening and subsegmental mucous plugging in the bilateral lower lobes. 3. Aortic Atherosclerosis (ICD10-I70.0). Electronically Signed   By: Darrin Nipper M.D.   On: 11/28/2022 13:37   Result Date: 11/28/2022 HISTORY: Chest pain, nonspecific EXAM: Cardiac/Coronary CT TECHNIQUE: The patient was scanned on a [employer]. PROTOCOL: A 120 kV prospective scan was triggered in the descending thoracic aorta at 111 HU's. Axial non-contrast 3 mm slices were carried out through the heart. The data set was analyzed on a dedicated work station and scored using the Agatston method. Gantry rotation speed was 250 msecs and collimation was 0.6  mm. Heart rate was optimized medically and sl NTG was given. The 3D data set was reconstructed in 5% intervals of the 35-75 % of the R-R cycle. Systolic and diastolic phases were analyzed on a dedicated work station using MPR, MIP and VRT modes. FINDINGS: Coronary calcium score: The patient's coronary artery calcium score is 568, which places the patient in the 79th percentile. Coronary arteries: Normal coronary origins.  Right dominance. Right Coronary Artery: Normal caliber vessel, gives rise to PDA. Scattered  calcified plaque with maximum 1-24% stenosis. Left Main Coronary Artery: Normal caliber vessel. No significant plaque or stenosis. Left Anterior Descending Coronary Artery: Normal caliber vessel. Proximal vessel with mixed calcified and noncalcified plaque with 50-69% stenosis. Gives rise to large first diagonal branch without significant stenosis. Left Circumflex Artery: Normal caliber vessel. No significant plaque or stenosis in visualized vessel. There is an abrupt cutoff in the proximal LCX concerning for total occlusion. No OM branches visualized. There is slight distal reconstitution, likely from collaterals though these are not well visualized There are surgical clips noted along an atretic LIMA graft. There is no flow seen within vessel. Aorta: Normal size, 28 mm at the mid ascending aorta (level of the PA bifurcation) measured double oblique. Aortic atherosclerosis. No dissection seen in visualized portions of the aorta. There is an area on the proximal ascending aorta with outpouching. There is no graft known to be attached to aorta but almost has appearance of coronary graft anastomosis. Concerning for ulcer. No contrast visualized within structure. Aortic Valve: No calcifications. Trileaflet. Other findings: Normal pulmonary vein drainage into the left atrium. Atrial appendage not visualized. Normal size of the pulmonary artery. Normal appearance of the pericardium. Mitral annular calcification.  There appears to be a small PFO, but there is no clear shunting visualized. Pacer wires noted in RA,RV, and coronary sinus. IMPRESSION: 1. Total coronary occlusion, CADRADS=5. Proximal left circumflex with complete occlusion and distal reconstitution likely by collaterals. CT FFR will be performed and reported separately. 2. Coronary calcium score of 568. This was 79th percentile for age and sex matched control. 3. Normal coronary origin with right dominance. 4. Atretic LIMA graft without apparent flow. Surgical clips suggest pedicle LIMA but not well visualized. 5.  CRT pacemaker device in place. 6. There is an area on the ascending aorta with outpouching. There is no graft known to be attached to aorta but almost has appearance of coronary graft anastomosis. Concerning for ulcer. No contrast visualized within structure. Findings reported to ordering provider. INTERPRETATION: CAD-RADS 5: Total coronary occlusion (100%). Consider cardiac catheterization or viability assessment. Consider symptom-guided anti-ischemic pharmacotherapy as well as risk factor modification per guideline directed care. Electronically Signed: By: Buford Dresser M.D. On: 11/28/2022 13:01   ECHOCARDIOGRAM COMPLETE  Result Date: 11/27/2022    ECHOCARDIOGRAM REPORT   Patient Name:   Anita Duke Date of Exam: 11/27/2022 Medical Rec #:  [id number]             Height:       60.0 in Accession #:    [id number]            Weight:       139.4 lb Date of Birth:  1941-05-23              BSA:          18.601 m Patient Age:    35 years              BP:           116/60 mmHg Patient Gender: F                     HR:           70 bpm. Exam Location:  Inpatient Procedure: 2D Echo, Cardiac Doppler and Color Doppler Indications:    Chest Pain R07.9  History:        Patient has prior history of Echocardiogram examinations, most  recent 09/02/2021. CHF, CAD, Pacemaker and Prior CABG,                 Arrythmias:Atrial Fibrillation;  Risk Factors:Hypertension,                 Diabetes and Dyslipidemia. Breast Cancer of the Left breast.  Sonographer:    Ronny Flurry Referring Phys: [place]  1. Left ventricular ejection fraction, by estimation, is 50 to 55%. The left ventricle has low normal function. The left ventricle demonstrates regional wall motion abnormalities (see scoring diagram/findings for description). There is mild concentric left ventricular hypertrophy. Left ventricular diastolic parameters are indeterminate. There is akinesis of the left ventricular, basal-mid inferior wall and inferolateral wall.  2. Right ventricular systolic function is moderately reduced. The right ventricular size is normal.  3. Left atrial size was severely dilated.  4. Right atrial size was moderately dilated.  5. MV mean gradient stable. The mitral valve has been repaired/replaced. Trivial mitral valve regurgitation. No evidence of mitral stenosis. The mean mitral valve gradient is 3.0 mmHg. Moderate mitral annular calcification. Echo findings are consistent with normal structure and function of the mitral valve prosthesis.  6. The aortic valve is tricuspid. There is mild calcification of the aortic valve. Aortic valve regurgitation is trivial. Aortic valve sclerosis/calcification is present, without any evidence of aortic stenosis. Aortic valve area, by VTI measures 2.40 cm. Aortic valve mean gradient measures 3.0 mmHg. Aortic valve Vmax measures 1.11 m/s.  7. The inferior vena cava is normal in size with greater than 50% respiratory variability, suggesting right atrial pressure of 3 mmHg. FINDINGS  Left Ventricle: Left ventricular ejection fraction, by estimation, is 50 to 55%. The left ventricle has low normal function. The left ventricle demonstrates regional wall motion abnormalities. The left ventricular internal cavity size was normal in size. There is mild concentric left ventricular hypertrophy. Abnormal (paradoxical)  septal motion consistent with post-operative status. Left ventricular diastolic function could not be evaluated due to indeterminate diastolic function. Left ventricular diastolic parameters are indeterminate. Right Ventricle: The right ventricular size is normal. No increase in right ventricular wall thickness. Right ventricular systolic function is moderately reduced. Left Atrium: Left atrial size was severely dilated. Right Atrium: Right atrial size was moderately dilated. Pericardium: There is no evidence of pericardial effusion. Mitral Valve: MV mean gradient stable. The mitral valve has been repaired/replaced. Moderate mitral annular calcification. Trivial mitral valve regurgitation. There is a prosthetic annuloplasty ring present in the mitral position. Echo findings are consistent with normal structure and function of the mitral valve prosthesis. No evidence of mitral valve stenosis. MV peak gradient, 8.1 mmHg. The mean mitral valve gradient is 3.0 mmHg. Tricuspid Valve: The tricuspid valve is normal in structure. Tricuspid valve regurgitation is mild . No evidence of tricuspid stenosis. Aortic Valve: The aortic valve is tricuspid. There is mild calcification of the aortic valve. Aortic valve regurgitation is trivial. Aortic valve sclerosis/calcification is present, without any evidence of aortic stenosis. Aortic valve mean gradient measures 3.0 mmHg. Aortic valve peak gradient measures 4.9 mmHg. Aortic valve area, by VTI measures 2.40 cm. Pulmonic Valve: The pulmonic valve was normal in structure. Pulmonic valve regurgitation is not visualized. No evidence of pulmonic stenosis. Aorta: The aortic root is normal in size and structure. Venous: The inferior vena cava is normal in size with greater than 50% respiratory variability, suggesting right atrial pressure of 3 mmHg. IAS/Shunts: No atrial level shunt detected by color flow Doppler. Additional Comments:  A device lead is visualized.  LEFT VENTRICLE PLAX  2D LVIDd:         4.20 cm LVIDs:         3.28 cm LV PW:         1.40 cm LV IVS:        1.07 cm LVOT diam:     2.10 cm LV SV:         48 LV SV Index:   30 LVOT Area:     3.46 cm  RIGHT VENTRICLE            IVC RV S prime:     9.32 cm/s  IVC diam: 1.70 cm TAPSE (M-mode): 1.8 cm LEFT ATRIUM              Index        RIGHT ATRIUM           Index LA diam:        6.00 cm  3.75 cm/m   RA Area:     25.30 cm LA Vol (A2C):   78.5 ml  49.05 ml/m  RA Volume:   73.80 ml  46.09 ml/m LA Vol (A4C):   89.0 ml  55.59 ml/m LA Biplane Vol: 111.0 ml 69.33 ml/m  AORTIC VALVE AV Area (Vmax):    2.70 cm AV Area (Vmean):   2.53 cm AV Area (VTI):     2.40 cm AV Vmax:           111.00 cm/s AV Vmean:          73.400 cm/s AV VTI:            0.200 m AV Peak Grad:      4.9 mmHg AV Mean Grad:      3.0 mmHg LVOT Vmax:         86.57 cm/s LVOT Vmean:        53.600 cm/s LVOT VTI:          0.139 m LVOT/AV VTI ratio: 0.69  AORTA Ao Root diam: 3.20 cm Ao Asc diam:  2.90 cm MITRAL VALVE                TRICUSPID VALVE MV Area VTI:  1.35 cm      TR Peak grad:   18.5 mmHg MV Peak grad: 8.1 mmHg      TR Vmax:        215.00 cm/s MV Mean grad: 3.0 mmHg MV Vmax:      1.42 m/s      SHUNTS MV Vmean:     71.0 cm/s     Systemic VTI:  0.14 m MV E velocity: 121.00 cm/s  Systemic Diam: 2.10 cm Glori Bickers MD Electronically signed by Glori Bickers MD Signature Date/Time: 11/27/2022/2:01:50 PM    Final     Cardiac Studies   See cor CT and echo outlined above  Patient Profile     82 y.o. 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 Aundra Dubin and Dr. Curt Bears.She presented to  the hospital with cough, DOE, weakness in her legs causing her to fall. She was found to have NSTEMI and metapneumovirus.   Assessment & Plan    1. Weakness, cough, dyspnea, metapneumovirus infection - supportive care per primary team - CT overread relayed to Dr. Cathlean Sauer yesterday with ground-glass and tree-in-bud nodules in RUL (infectious vs inflammatory), layering secretions and subsegmental mucous plugging - clinically doing much better  2. NSTEMI, CAD s/p CABG 2001 - cor CT with findings above with prox LCX with complete occlusion and distal reconstitution likely by collaterals; FFR unable to be sent per Dr. Judeth Cornfield review, atrettic LIMA graft without apparent flow, area on the ascending aorta with outpouching. There is no graft known to be attached to aorta but almost has appearance of coronary graft anastomosis. Concerning for ulcer. No contrast visualized within structure. - remains on heparin at this time  - remains on carvedilol, atorvastatin  Shared Decision Making/Informed Consent The risks [stroke (1 in 1000), death (1 in 1000), kidney failure [usually temporary] (1 in 500), bleeding (1 in 200), allergic reaction [possibly serious] (1 in 200)], benefits (diagnostic support and management of coronary artery disease) and alternatives of a cardiac catheterization were discussed in detail with [name] and she is willing to proceed if that is what MD recommends.  3. Permanent afib s/p AVN ablation with subsequent PPM and BiV upgrade 2015 - Coumadin on hold pending dispo for cath - heparin per pharmacy - continue BB  4. Chronic HFrEF with improved EF, HTN - previously as low as 40% in the distant past, with echo this admission with EF 50-55% + WMA as outlined above, moderately reduced RVSF, severe LAE, moderate RAE, normal MVR, aortic sclerosis without  stenosis - remains on carvedilol, Jardiance, Entresto, spironolactone - will pause Jardiance + spiro in case cath planned  5. Prolonged QT interval by EKG 11/25/22 - initial EKG with QTC 648ms in setting of V paced rhythm, now 572ms and acceptable given QRS duration - hold Zofran - last Mg, K wnl   Has f/u with Dr. Aundra Dubin in May    For questions or updates, please contact [place] Please consult [email or web address] for contact info under Cardiology/STEMI.  Signed, Charlie Pitter, PA-C 11/29/2022, 8:15 AM    History and all data above reviewed.  Patient examined.  I agree with the findings as above.   She has had no chest pain.  No SOB.  Cough is improved.  The patient exam reveals COR:RRR, no murmur  ,  Lungs: Clear  ,  Abd: Positive bowel sounds, no rebound no guarding, Ext No edema   .  All available labs, radiology testing, previous records reviewed. Agree with documented assessment and plan.      Elevated troponin:  She might have an occluded circ.  LAD looks to be non obstructive.  None of her symptoms were consistent with an acute cardiac syndrome.  I would suggest this was pneumonia with a trop leak.  She and I had a long discussion about this.  She will follow with Dr. Aundra Dubin and if she has continued symptoms of dyspnea or other vague weakness I would have a low threshold to cath in the future.   Atrial fib:  OK to go home without heparin bridge.  Cardiomyopathy:   EF was low normal this admission.  Continue current meds. Prolonged QT:  OK to continue meds as listed.  Questionable ulcer in the aorta.  Can consider a dedicated CT aorta in the  future as an outpatient.  Can do an aortogram in the future if she has cath.    Message sent to Dr. Aundra Dubin to update him on recent events.    Jeneen Rinks Kaiea Esselman  11:13 AM  11/29/2022

## 2022-11-29 NOTE — Progress Notes (Signed)
Cx'd echo in May since she had in house. Will keep f/u per Dr. Percival Spanish in May with Dr. Aundra Dubin. Pt also appears to have INR f/u with her PCP on 4/2.

## 2022-11-29 NOTE — Progress Notes (Signed)
[place] for heparin  Indication: chest pain/ACS and Afib Brief A/P: Heparin level within goal range Continue Heparin at current rate  Allergies  Allergen Reactions   Demerol [Meperidine] Nausea And Vomiting   Morphine Nausea And Vomiting   Januvia [Sitagliptin] Other (See Comments)    Headaches, did not feel well   Metformin And Related Diarrhea   Tetanus Toxoid Rash and Other (See Comments)    "years ago"     Vital Signs: Temp: 98.2 F (36.8 C) (03/27 0730) Temp Source: Oral (03/27 0730) BP: 145/61 (03/27 0730) Pulse Rate: 70 (03/27 0730)  Labs: Recent Labs    11/27/22 0052 11/27/22 1558 11/28/22 0102 11/28/22 1320 11/29/22 0051 11/29/22 0608  HGB  --   --  15.0  --  13.6  --   HCT  --   --  46.1*  --  41.0  --   PLT  --   --  175  --  155  --   LABPROT 19.0*  --  16.3*  --  17.2*  --   INR 1.6*  --  1.3*  --  1.4*  --   HEPARINUNFRC  --    < > 0.35 0.24* 0.44 0.54  CREATININE 0.83  --  0.68  --  0.72  --    < > = values in this interval not displayed.     Estimated Creatinine Clearance: 45.1 mL/min (by C-G formula based on SCr of 0.72 mg/dL).  Assessment: 82 y.o. female with h/o Afib on warfarin PTA. Warfarin now on hold while awaiting possible cath. Pharmacy consulted for heparin.     Heparin level 0.54 is therapeutic on 1100units/hr.  Goal of Therapy:  Heparin level 0.3-0.7 units/ml Monitor platelets by anticoagulation protocol: Yes   Plan:  Continue heparin 1100 units/hr Monitor daily heparin level, CBC Monitor for signs/symptoms of bleeding  F/u restart warfarin   Benetta Spar, PharmD, BCPS, BCCP Clinical Pharmacist  Please check AMION for all [place] phone numbers After 10:00 PM, call [place]

## 2022-11-30 ENCOUNTER — Telehealth: Payer: Self-pay | Admitting: *Deleted

## 2022-11-30 ENCOUNTER — Encounter: Payer: Self-pay | Admitting: *Deleted

## 2022-11-30 NOTE — Transitions of Care (Post Inpatient/ED Visit) (Signed)
   11/30/2022  Name: Anita Duke MRN: [id number] DOB: 05/09/1941  Today's TOC FU Call Status: Today's TOC FU Call Status:: Successful TOC FU Call Competed TOC FU Call Complete Date: 11/30/22  Transition Care Management Follow-up Telephone Call Date of Discharge: 11/29/22 Discharge Facility: [facility]) Type of Discharge: Inpatient Admission Primary Inpatient Discharge Diagnosis:: viral pneumonia, myocarditis How have you been since you were released from the hospital?: Better Any questions or concerns?: No  Items Reviewed: Did you receive and understand the discharge instructions provided?: Yes (thoroughly reviewed with patient who verbalizes excellent understanding of same) Medications obtained and verified?: Yes (Medications Reviewed) (Full medication review completed; no concerns or discrepancies identified; confirmed patient obtained/ is taking all newly Rx'd medications as instructed; self-manages medications and denies questions/ concerns around medications today) Any new allergies since your discharge?: No Dietary orders reviewed?: Yes Type of Diet Ordered:: Heart Healthy/ low salt Do you have support at home?: Yes People in Home: alone Name of Support/Comfort Primary Source: Lives at [facility]; lives alone has assistance as needed; reports she is independent in self-care activities  [place] and Equipment/Supplies: [place] Ordered?: No Any new equipment or medical supplies ordered?: No  Functional Questionnaire: Do you need assistance with bathing/showering or dressing?: No Do you need assistance with meal preparation?: No Do you need assistance with eating?: No Do you have difficulty maintaining continence: No Do you need assistance with getting out of bed/getting out of a chair/moving?: No Do you have difficulty managing or taking your medications?: No  Follow up appointments reviewed: PCP Follow-up appointment  confirmed?: Yes Date of PCP follow-up appointment?: 12/05/22 Follow-up Provider: PCP [facility] Follow-up appointment confirmed?: Yes Date of Specialist follow-up appointment?: 01/22/23 Follow-Up Specialty Provider:: cardiology Do you need transportation to your follow-up appointment?: No Do you understand care options if your condition(s) worsen?: Yes-patient verbalized understanding  SDOH Interventions Today    Flowsheet Row Most Recent Value  SDOH Interventions   Food Insecurity Interventions Intervention Not Indicated  [Lives at [facility]- has prepared meals]  Transportation Interventions Intervention Not Indicated  [drives self]      TOC Interventions Today    Flowsheet Row Most Recent Value  TOC Interventions   TOC Interventions Discussed/Reviewed TOC Interventions Discussed  [Patient declines need for ongoing/ further care coordination outreach,  no care coordination needs identified at time of TOC call today,  provided my direct contact information should questions/ concerns/ needs arise post-TOC call]      Interventions Today    Flowsheet Row Most Recent Value  Chronic Disease   Chronic disease during today's visit Other  [acute pneumonia secondary to metapneumovirus]  General Interventions   General Interventions Discussed/Reviewed General Interventions Discussed, Doctor Visits  Doctor Visits Discussed/Reviewed Doctor Visits Discussed, PCP  PCP/Specialist Visits Compliance with follow-up visit  Nutrition Interventions   Nutrition Discussed/Reviewed Nutrition Discussed  Pharmacy Interventions   Pharmacy Dicussed/Reviewed Pharmacy Topics Discussed  [Full medication review with updating medication list in EHR per patient report]      Oneta Rack, RN, BSN, CCRN Alumnus RN CM Care Coordination/ Transition of [place] Management [phone]: direct office

## 2022-12-05 ENCOUNTER — Encounter: Payer: Self-pay | Admitting: Internal Medicine

## 2022-12-05 ENCOUNTER — Ambulatory Visit (INDEPENDENT_AMBULATORY_CARE_PROVIDER_SITE_OTHER): Payer: Medicare HMO | Admitting: Internal Medicine

## 2022-12-05 ENCOUNTER — Other Ambulatory Visit (HOSPITAL_COMMUNITY): Payer: Self-pay

## 2022-12-05 ENCOUNTER — Ambulatory Visit (INDEPENDENT_AMBULATORY_CARE_PROVIDER_SITE_OTHER): Payer: Medicare HMO

## 2022-12-05 VITALS — BP 124/72 | HR 70 | Temp 98.5°F | Ht 60.0 in | Wt 142.0 lb

## 2022-12-05 DIAGNOSIS — E782 Mixed hyperlipidemia: Secondary | ICD-10-CM | POA: Diagnosis not present

## 2022-12-05 DIAGNOSIS — J129 Viral pneumonia, unspecified: Secondary | ICD-10-CM

## 2022-12-05 DIAGNOSIS — E1169 Type 2 diabetes mellitus with other specified complication: Secondary | ICD-10-CM

## 2022-12-05 DIAGNOSIS — Z7901 Long term (current) use of anticoagulants: Secondary | ICD-10-CM

## 2022-12-05 DIAGNOSIS — E785 Hyperlipidemia, unspecified: Secondary | ICD-10-CM

## 2022-12-05 DIAGNOSIS — I10 Essential (primary) hypertension: Secondary | ICD-10-CM

## 2022-12-05 DIAGNOSIS — B3322 Viral myocarditis: Secondary | ICD-10-CM

## 2022-12-05 LAB — POCT INR: INR: 1.7 — AB (ref 2.0–3.0)

## 2022-12-05 MED ORDER — BENZONATATE 200 MG PO CAPS
200.0000 mg | ORAL_CAPSULE | Freq: Three times a day (TID) | ORAL | 1 refills | Status: DC | PRN
  Filled 2022-12-05: qty 30, 10d supply, fill #0

## 2022-12-05 NOTE — Progress Notes (Signed)
Pt also had PCP apt today.  Pt in hospital on 3/23 with pneumonia. Increase dose today to take 1 1/2 tablets and then change weekly dose to take 1 tablet daily except take 1 1/2 tablets on Mondays and Fridays and recheck in 3 weeks.

## 2022-12-05 NOTE — Assessment & Plan Note (Signed)
Recent hospitalization for myocarditis secondary to metapneumovirus Never had chest pain Presented with back pain, shortness of breath, cough, significant weakness Echo with no wall motion abnormality, preserved EF Coronary CT done-CAD stable-advised medical therapy only by cardiology Has cardiology follow-up next month Continue current medications-carvedilol, Entresto, spironolactone, SGLT2 inhibitor

## 2022-12-05 NOTE — Patient Instructions (Addendum)
        Medications changes include :   none      Return for follow up as scheduled.  

## 2022-12-05 NOTE — Progress Notes (Signed)
Subjective:    Patient ID: Anita Duke, female    DOB: [birth year], 82 y.o.   MRN: [id number]     HPI Anita Duke is here for follow up in the hospital.  Admitted 3/23-3/27 for myocarditis  Presented to the ED with weakness, shortness of breath.  She was experiencing URI symptoms for 3 days with worsening weakness, dyspnea on exertion-the day she went to the ED she was dyspneic with minimal efforts.  Chest x-ray with cardiomegaly, faint opacity in right upper lobe, no effusions.  EKG with paced rhythm, no ST segment changes-no change compared to prior EKG.  COVID, influenza A/B-.  WBC 6.5.  Troponin 3029, 2705.  Creatinine normal.  Myocarditis, NSTEMI: High sensitive troponin trending down Never had chest pain Positive for metapneumovirus 3/24-follow-up chest x-ray right upper lobe platelike atelectasis.  Cardiomegaly with bilateral hilar congestion and cephalization of the vasculature. Echo without significant wall motion abnormalities, preserved EF Coronary CT with proximal left circumflex with complete occlusion and distal reconstitution likely via collaterals, atrettic LIMA graft without apparent flow, area of ascending aorta with outpouching.  Has appearance of coronary graft anastomosis although no graft is present.  Concern for ulcer.  Acute on chronic diastolic heart failure: Continued on carvedilol, Entresto, spironolactone and SGLT2 inhibitor IV furosemide x 1 dose Follow-up in heart failure clinic  Viral pneumonia: CT chest with groundglass opacities and tree-in-bud nodules suggestive of viral pneumonia Bacterial pneumonia was ruled out and antibiotics were discontinued On day of discharge oxygen saturation 94% on RA  CAD: History of revascularization At baseline active without angina Echo without WMA, but CT angio complete occlusion and distal reconstitution likely collaterals at proximal circumflex Initially placed on heparin drip when INR was  subtherapeutic Cardiology consulted-continue medical therapy Renal function, electrolytes as outpatient  Essential hypertension: Continued on carvedilol, spironolactone, Entresto  Atrial fibrillation: Continue rate control carvedilol Resume warfarin Discharge INR 1.4-outpatient follow-up  Type 2 diabetes with hyperlipidemia: Placed on insulin sliding scale Glucose stable  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  Still with dry, wet sounding cough at times but has improved.  She still has some shortness of breath, but that is much improved.  She denies chest pain or palpitations-never had chest pain.  She denies any wheezing, leg swelling, fevers, sweats.  Readings not 100% back to normal, but much improved.  She is taking Best boy for her cough as needed.  She has a follow-up scheduled with cardiology next month.   Medications and allergies reviewed with patient and updated if appropriate.  Current Outpatient Medications on File Prior to Visit  Medication Sig Dispense Refill   amoxicillin (AMOXIL) 500 MG capsule TAKE 4 CAPSULES PRIOR TO DENTAL APPOINTMENTS AS DIRECTED 20 capsule 0   atorvastatin (LIPITOR) 40 MG tablet Take 1 tablet (40 mg total) by mouth daily. 90 tablet 3   carvedilol (COREG) 12.5 MG tablet Take 1 tablet (12.5 mg total) by mouth 2 (two) times daily with a meal. 180 tablet 3   cetirizine (ZYRTEC) 10 MG tablet Take 10 mg by mouth daily.     cholecalciferol (VITAMIN D) 1000 UNITS tablet Take 1,000 Units by mouth daily.      cyanocobalamin (VITAMIN B12) 500 MCG tablet Take 500 mcg by mouth daily.     empagliflozin (JARDIANCE) 25 MG TABS tablet Take 1 tablet (25 mg total) by mouth daily. 30 tablet 3   ENTRESTO 24-26 MG TAKE 1 TABLET TWICE  A DAY 60 tablet 6   fish oil-omega-3 fatty acids 1000 MG capsule Take 1 g by mouth 2 (two) times daily.     Polyethyl Glycol-Propyl Glycol (SYSTANE) 0.4-0.3 % SOLN  Place 1 drop into both eyes daily.     sodium chloride (OCEAN) 0.65 % SOLN nasal spray Place 1 spray into both nostrils 2 (two) times daily.     spironolactone (ALDACTONE) 25 MG tablet Take 1 tablet (25 mg total) by mouth daily. 90 tablet 3   tamoxifen (NOLVADEX) 20 MG tablet Take 1 tablet (20 mg total) by mouth daily. 90 tablet 2   venlafaxine XR (EFFEXOR-XR) 37.5 MG 24 hr capsule Take 1 capsule (37.5 mg total) by mouth daily. 90 capsule 2   warfarin (COUMADIN) 2.5 MG tablet TAKE 1 TABLET BY MOUTH DAILY OR AS DIRECTED BY ANTICOAGULATION  CLINIC (Patient taking differently: Take 2.5-3.75 mg by mouth every evening. 1 tablet everyday except on Friday patient takes 1.5 tablets to equal 3.75 mg) 104 tablet 3   No current facility-administered medications on file prior to visit.     Review of Systems  Constitutional:  Negative for diaphoresis and fever.  Respiratory:  Positive for cough (mild - dry, minimal congestion - improving) and shortness of breath (improved - mild). Negative for chest tightness and wheezing.   Cardiovascular:  Negative for chest pain, palpitations and leg swelling.  Gastrointestinal:  Negative for abdominal pain and nausea.  Neurological:  Negative for light-headedness and headaches.       Objective:   Vitals:   12/05/22 1506  BP: 124/72  Pulse: 70  Temp: 98.5 F (36.9 C)  SpO2: 98%   BP Readings from Last 3 Encounters:  12/05/22 124/72  11/29/22 (!) 129/53  10/02/22 132/68   Wt Readings from Last 3 Encounters:  12/05/22 142 lb (64.4 kg)  11/29/22 140 lb (63.5 kg)  10/02/22 142 lb (64.4 kg)   Body mass index is 27.73 kg/m.    Physical Exam Constitutional:      General: She is not in acute distress.    Appearance: Normal appearance.  HENT:     Head: Normocephalic and atraumatic.  Eyes:     Conjunctiva/sclera: Conjunctivae normal.  Cardiovascular:     Rate and Rhythm: Normal rate and regular rhythm.     Heart sounds: Normal heart sounds.   Pulmonary:     Effort: Pulmonary effort is normal. No respiratory distress.     Breath sounds: Normal breath sounds. No wheezing.  Musculoskeletal:     Cervical back: Neck supple.     Right lower leg: No edema.     Left lower leg: No edema.  Lymphadenopathy:     Cervical: No cervical adenopathy.  Skin:    General: Skin is warm and dry.     Findings: No rash.  Neurological:     Mental Status: She is alert. Mental status is at baseline.  Psychiatric:        Mood and Affect: Mood normal.        Behavior: Behavior normal.        Lab Results  Component Value Date   WBC 6.5 11/29/2022   HGB 13.6 11/29/2022   HCT 41.0 11/29/2022   PLT 155 11/29/2022   GLUCOSE 109 (H) 11/29/2022   CHOL 127 10/02/2022   TRIG 106.0 10/02/2022   HDL 61.10 10/02/2022   LDLDIRECT 65.0 01/05/2016   LDLCALC 45 10/02/2022   ALT 34 11/25/2022   AST 66 (H) 11/25/2022  NA 135 11/29/2022   K 4.3 11/29/2022   CL 102 11/29/2022   CREATININE 0.72 11/29/2022   BUN 20 11/29/2022   CO2 24 11/29/2022   TSH 0.99 10/02/2022   INR 1.4 (H) 11/29/2022   HGBA1C 7.6 (H) 10/02/2022   MICROALBUR <0.7 10/02/2022   CT CORONARY MORPH W/CTA COR W/SCORE W/CA W/CM &/OR WO/CM Addendum: ADDENDUM REPORT: 11/28/2022 13:37   EXAM:  OVER-READ INTERPRETATION  CT CHEST   The following report is an over-read performed by radiologist Dr.  Judithann Sauger [facility] on 11/28/2022. This over-read  does not include interpretation of cardiac or coronary anatomy or  pathology. The coronary CTA interpretation by the cardiologist is  attached.   COMPARISON:  CT chest dated 11/19/2012   FINDINGS:  Cardiovascular: Left chest wall pacemaker leads terminate in the  right atrium, right ventricle, and tributary of the coronary sinus.  Normal appearance of extracardiac vascular structures. Aortic  atherosclerosis.   Mediastinum/Nodes: Normal esophagus. No pathologically enlarged  mediastinal or hilar lymph nodes.    Lungs/Pleura: The central airways are patent. Layering secretions  within the distal trachea extending into the left main bronchus.  Diffuse bronchial wall thickening with subsegmental mucous plugging  in the bilateral lower lobes. Ground-glass and tree-in-bud nodules  in the right upper lobe. Bibasilar subsegmental atelectasis. No  pneumothorax. No pleural effusion.   Upper abdomen: Unchanged subcentimeter hepatic segment 2 hypodensity  (10:116), too small to characterize but likely benign.   Musculoskeletal:  Median sternotomy wires are nondisplaced.   IMPRESSION:  1. Ground-glass and tree-in-bud nodules in the right upper lobe,  likely infectious or inflammatory.  2. Layering secretions within the distal trachea extending into the  left main bronchus with diffuse bronchial wall thickening and  subsegmental mucous plugging in the bilateral lower lobes.  3. Aortic Atherosclerosis (ICD10-I70.0).   Electronically Signed    By: Darrin Nipper M.D.    On: 11/28/2022 13:37 Narrative: HISTORY: Chest pain, nonspecific  EXAM: Cardiac/Coronary CT  TECHNIQUE: The patient was scanned on a [employer].  PROTOCOL: A 120 kV prospective scan was triggered in the descending thoracic aorta at 111 HU's. Axial non-contrast 3 mm slices were carried out through the heart. The data set was analyzed on a dedicated work station and scored using the Agatston method. Gantry rotation speed was 250 msecs and collimation was 0.6 mm. Heart rate was optimized medically and sl NTG was given. The 3D data set was reconstructed in 5% intervals of the 35-75 % of the R-R cycle. Systolic and diastolic phases were analyzed on a dedicated work station using MPR, MIP and VRT modes.  FINDINGS: Coronary calcium score: The patient's coronary artery calcium score is 568, which places the patient in the 79th percentile.  Coronary arteries: Normal coronary origins.  Right dominance.  Right Coronary Artery:  Normal caliber vessel, gives rise to PDA. Scattered calcified plaque with maximum 1-24% stenosis.  Left Main Coronary Artery: Normal caliber vessel. No significant plaque or stenosis.  Left Anterior Descending Coronary Artery: Normal caliber vessel. Proximal vessel with mixed calcified and noncalcified plaque with 50-69% stenosis. Gives rise to large first diagonal branch without significant stenosis.  Left Circumflex Artery: Normal caliber vessel. No significant plaque or stenosis in visualized vessel. There is an abrupt cutoff in the proximal LCX concerning for total occlusion. No OM branches visualized. There is slight distal reconstitution, likely from collaterals though these are not well visualized  There are surgical clips noted along an atretic LIMA  graft. There is no flow seen within vessel.  Aorta: Normal size, 28 mm at the mid ascending aorta (level of the PA bifurcation) measured double oblique. Aortic atherosclerosis. No dissection seen in visualized portions of the aorta. There is an area on the proximal ascending aorta with outpouching. There is no graft known to be attached to aorta but almost has appearance of coronary graft anastomosis. Concerning for ulcer. No contrast visualized within structure.  Aortic Valve: No calcifications. Trileaflet.  Other findings:  Normal pulmonary vein drainage into the left atrium.  Atrial appendage not visualized.  Normal size of the pulmonary artery.  Normal appearance of the pericardium.  Mitral annular calcification.  There appears to be a small PFO, but there is no clear shunting visualized.  Pacer wires noted in RA,RV, and coronary sinus.  IMPRESSION: 1. Total coronary occlusion, CADRADS=5. Proximal left circumflex with complete occlusion and distal reconstitution likely by collaterals. CT FFR will be performed and reported separately.  2. Coronary calcium score of 568. This was 79th percentile for age and  sex matched control.  3. Normal coronary origin with right dominance.  4. Atretic LIMA graft without apparent flow. Surgical clips suggest pedicle LIMA but not well visualized.  5.  CRT pacemaker device in place.  6. There is an area on the ascending aorta with outpouching. There is no graft known to be attached to aorta but almost has appearance of coronary graft anastomosis. Concerning for ulcer. No contrast visualized within structure.  Findings reported to ordering provider.  INTERPRETATION:  CAD-RADS 5: Total coronary occlusion (100%). Consider cardiac catheterization or viability assessment. Consider symptom-guided anti-ischemic pharmacotherapy as well as risk factor modification per guideline directed care.  Electronically Signed: By: Buford Dresser M.D. On: 11/28/2022 13:01    Assessment & Plan:    See Problem List for Assessment and Plan of chronic medical problems.

## 2022-12-05 NOTE — Assessment & Plan Note (Signed)
Recent hospitalization with viral pneumonia secondary to metapneumovirus Also with myocarditis Viral pneumonia symptoms improving-still with cough, shortness of breath Continue Tessalon Perles as needed

## 2022-12-05 NOTE — Patient Instructions (Addendum)
Pre visit review using our clinic review tool, if applicable. No additional management support is needed unless otherwise documented below in the visit note.  Increase dose today to take 1 1/2 tablets and then change weekly dose to take 1 tablet daily except take 1 1/2 tablets on Mondays and Fridays and recheck in 3 weeks.

## 2022-12-05 NOTE — Assessment & Plan Note (Signed)
Chronic Continue atorvastatin 40 mg daily 

## 2022-12-05 NOTE — Assessment & Plan Note (Addendum)
Chronic   Lab Results  Component Value Date   HGBA1C 7.6 (H) 10/02/2022   Sugars not ideally controlled Continue Jardiance 25 mg daily Stressed regular exercise, diabetic diet

## 2022-12-05 NOTE — Assessment & Plan Note (Signed)
Chronic Blood pressure well controlled Continue Coreg 12.5 mg twice daily, Entresto 24-26 mg twice daily, spironolactone 25 mg daily

## 2022-12-22 ENCOUNTER — Ambulatory Visit: Payer: Medicare HMO

## 2022-12-26 ENCOUNTER — Telehealth: Payer: Self-pay

## 2022-12-26 ENCOUNTER — Ambulatory Visit (INDEPENDENT_AMBULATORY_CARE_PROVIDER_SITE_OTHER): Payer: Medicare HMO

## 2022-12-26 DIAGNOSIS — Z7901 Long term (current) use of anticoagulants: Secondary | ICD-10-CM | POA: Diagnosis not present

## 2022-12-26 LAB — POCT INR: INR: 4 — AB (ref 2.0–3.0)

## 2022-12-26 NOTE — Progress Notes (Signed)
Pt denies any changes. Hold dose today and then change weekly dose to take 1 tablet daily except take 1 1/2 tablets on Fridays and recheck in 2 weeks.

## 2022-12-26 NOTE — Patient Instructions (Addendum)
Pre visit review using our clinic review tool, if applicable. No additional management support is needed unless otherwise documented below in the visit note.  Hold dose today and then change weekly dose to take 1 tablet daily except take 1 1/2 tablets on Fridays and recheck in 2 weeks.  

## 2022-12-26 NOTE — Telephone Encounter (Signed)
Pt in coumadin clinic today and reported she is still having leg weakness and it has not improved since hospitalization for pneumonia on 3/23. Pt would also like to start Rybelsis. This was mentioned in the January lab results and pt has not responded yet.  Advised pt it would be best to see PCP in office so an assessment could be performed and discussion concerning new medication. Pt agreed and was scheduled with PCP for tomorrow at 2 pm. Advised if any changes to contact office. Pt verbalized understanding.

## 2022-12-27 ENCOUNTER — Encounter: Payer: Self-pay | Admitting: Internal Medicine

## 2022-12-27 ENCOUNTER — Ambulatory Visit (INDEPENDENT_AMBULATORY_CARE_PROVIDER_SITE_OTHER): Payer: Medicare HMO | Admitting: Internal Medicine

## 2022-12-27 ENCOUNTER — Other Ambulatory Visit (HOSPITAL_COMMUNITY): Payer: Self-pay

## 2022-12-27 VITALS — BP 138/76 | HR 85 | Temp 97.6°F | Ht 60.0 in | Wt 145.1 lb

## 2022-12-27 DIAGNOSIS — E785 Hyperlipidemia, unspecified: Secondary | ICD-10-CM | POA: Diagnosis not present

## 2022-12-27 DIAGNOSIS — R531 Weakness: Secondary | ICD-10-CM | POA: Diagnosis not present

## 2022-12-27 DIAGNOSIS — Z7984 Long term (current) use of oral hypoglycemic drugs: Secondary | ICD-10-CM

## 2022-12-27 DIAGNOSIS — E1169 Type 2 diabetes mellitus with other specified complication: Secondary | ICD-10-CM

## 2022-12-27 MED ORDER — RYBELSUS 3 MG PO TABS
3.0000 mg | ORAL_TABLET | Freq: Every day | ORAL | 2 refills | Status: DC
  Filled 2022-12-27: qty 30, 30d supply, fill #0

## 2022-12-27 NOTE — Patient Instructions (Addendum)
      Medications changes include :   start rybelsus 3 mg daily      Return for follow up as scheduled.

## 2022-12-27 NOTE — Assessment & Plan Note (Signed)
New since the recent hospitalization Generalized weakness and decreased stamina There was improvement since discharge but still weak and can not walk as far -- feels like she is at a standstill now - not improving ? Cardiac in nature - sees cardiology 5/20 -- ? Myocarditis vs NSTEMI  Deferred PT - can order PT at any time Last labs were normal - so will hold off on labs She will continue walking dog, eating healthy

## 2022-12-27 NOTE — Progress Notes (Signed)
Subjective:    Patient ID: Anita Duke, female    DOB: 1941/07/20, 82 y.o.   MRN: [id number]      HPI Anita Duke is here for  Chief Complaint  Patient presents with   Medical Management of Chronic Issues    Having loss of energy, thinking it might be an effect of her pneumonia      Diabetes - ok with starting rybelsus.  Last A1c 7.6% two months ago.  She is still walking her dog - not as much.  Weakness since hospitalization  -  she still feels less energetic.  She has less stamina- is not walking as far with her dog as she did prior to the hospitalization.  She has made progress since she was discharged but now feels like she is at a standstill.    She sees cardiology 5/20.  Medications and allergies reviewed with patient and updated if appropriate.  Current Outpatient Medications on File Prior to Visit  Medication Sig Dispense Refill   atorvastatin (LIPITOR) 40 MG tablet Take 1 tablet (40 mg total) by mouth daily. 90 tablet 3   benzonatate (TESSALON) 200 MG capsule Take 1 capsule by mouth 3  times daily as needed for cough. 30 capsule 1   carvedilol (COREG) 12.5 MG tablet Take 1 tablet (12.5 mg total) by mouth 2 (two) times daily with a meal. 180 tablet 3   cetirizine (ZYRTEC) 10 MG tablet Take 10 mg by mouth daily.     cholecalciferol (VITAMIN D) 1000 UNITS tablet Take 1,000 Units by mouth daily.      cyanocobalamin (VITAMIN B12) 500 MCG tablet Take 500 mcg by mouth daily.     empagliflozin (JARDIANCE) 25 MG TABS tablet Take 1 tablet (25 mg total) by mouth daily. 30 tablet 3   ENTRESTO 24-26 MG TAKE 1 TABLET TWICE A DAY 60 tablet 6   fish oil-omega-3 fatty acids 1000 MG capsule Take 1 g by mouth 2 (two) times daily.     Polyethyl Glycol-Propyl Glycol (SYSTANE) 0.4-0.3 % SOLN Place 1 drop into both eyes daily.     sodium chloride (OCEAN) 0.65 % SOLN nasal spray Place 1 spray into both nostrils 2 (two) times daily.     spironolactone (ALDACTONE) 25 MG tablet Take 1  tablet (25 mg total) by mouth daily. 90 tablet 3   tamoxifen (NOLVADEX) 20 MG tablet Take 1 tablet (20 mg total) by mouth daily. 90 tablet 2   venlafaxine XR (EFFEXOR-XR) 37.5 MG 24 hr capsule Take 1 capsule (37.5 mg total) by mouth daily. 90 capsule 2   warfarin (COUMADIN) 2.5 MG tablet TAKE 1 TABLET BY MOUTH DAILY OR AS DIRECTED BY ANTICOAGULATION  CLINIC (Patient taking differently: Take 2.5-3.75 mg by mouth every evening. 1 tablet everyday except on Friday patient takes 1.5 tablets to equal 3.75 mg) 104 tablet 3   amoxicillin (AMOXIL) 500 MG capsule TAKE 4 CAPSULES PRIOR TO DENTAL APPOINTMENTS AS DIRECTED (Patient not taking: Reported on 12/27/2022) 20 capsule 0   No current facility-administered medications on file prior to visit.    Review of Systems  Constitutional:  Positive for fatigue. Negative for fever.  Respiratory:  Negative for shortness of breath.   Cardiovascular:  Negative for chest pain, palpitations and leg swelling.  Neurological:  Positive for weakness (little). Negative for light-headedness and headaches.       Objective:   Vitals:   12/27/22 1411  BP: 138/76  Pulse: 85  Temp: 97.6 F (36.4 C)  SpO2: 97%   BP Readings from Last 3 Encounters:  12/27/22 138/76  12/05/22 124/72  11/29/22 (!) 129/53   Wt Readings from Last 3 Encounters:  12/27/22 145 lb 2 oz (65.8 kg)  12/05/22 142 lb (64.4 kg)  11/29/22 140 lb (63.5 kg)   Body mass index is 28.34 kg/m.    Physical Exam Constitutional:      General: She is not in acute distress.    Appearance: Normal appearance.  HENT:     Head: Normocephalic and atraumatic.  Eyes:     Conjunctiva/sclera: Conjunctivae normal.  Cardiovascular:     Rate and Rhythm: Normal rate and regular rhythm.  Pulmonary:     Effort: Pulmonary effort is normal. No respiratory distress.     Breath sounds: Normal breath sounds. No wheezing.  Musculoskeletal:     Right lower leg: No edema.     Left lower leg: No edema.  Skin:     General: Skin is warm and dry.     Findings: No rash.  Neurological:     Mental Status: She is alert. Mental status is at baseline.  Psychiatric:        Mood and Affect: Mood normal.        Behavior: Behavior normal.            Assessment & Plan:    See Problem List for Assessment and Plan of chronic medical problems.

## 2022-12-27 NOTE — Assessment & Plan Note (Signed)
Chronic Lab Results  Component Value Date   HGBA1C 7.6 (H) 10/02/2022   Not ideally controlled Continue jardiance 25 mg daily Start rybelsus 3 mg daily Continue regular walking, diabetic diet

## 2022-12-28 ENCOUNTER — Other Ambulatory Visit: Payer: Self-pay

## 2023-01-05 ENCOUNTER — Other Ambulatory Visit: Payer: Self-pay

## 2023-01-05 DIAGNOSIS — C50112 Malignant neoplasm of central portion of left female breast: Secondary | ICD-10-CM

## 2023-01-07 NOTE — Assessment & Plan Note (Signed)
stage IA, pT1cN0, ER+/PR+/HER2-, Grade 2  -presented with palpable left breast lump. 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 Otherwise, no mutations. -she underwent lumpectomy on 05/26/21 with Dr. Donell Beers showing: IDC, 1.5 cm, grade 2; DCIS, intermediate grade; resection margins negative. -she began tamoxifen on 09/13/21. She reports weight gain and hot flashes. -she is clinically doing well from a breast cancer standpoint. Labs reviewed, overall no concern. Physical exam was unremarkable. There is no clinical concern for recurrence. -she will be due for annual mammogram in 03/2023; I ordered for her today. -Continue tamoxifen, we discussed healthy diet and exercise, and weight watch.

## 2023-01-08 ENCOUNTER — Encounter: Payer: Self-pay | Admitting: Hematology

## 2023-01-08 ENCOUNTER — Inpatient Hospital Stay: Payer: Medicare HMO | Admitting: Hematology

## 2023-01-08 ENCOUNTER — Other Ambulatory Visit: Payer: Self-pay

## 2023-01-08 ENCOUNTER — Inpatient Hospital Stay: Payer: Medicare HMO | Attending: Hematology

## 2023-01-08 VITALS — BP 160/64 | HR 78 | Temp 97.9°F | Resp 18 | Ht 60.0 in | Wt 145.5 lb

## 2023-01-08 DIAGNOSIS — F32A Depression, unspecified: Secondary | ICD-10-CM | POA: Diagnosis not present

## 2023-01-08 DIAGNOSIS — M8580 Other specified disorders of bone density and structure, unspecified site: Secondary | ICD-10-CM | POA: Insufficient documentation

## 2023-01-08 DIAGNOSIS — Z170 Estrogen receptor positive status [ER+]: Secondary | ICD-10-CM | POA: Insufficient documentation

## 2023-01-08 DIAGNOSIS — Z79899 Other long term (current) drug therapy: Secondary | ICD-10-CM | POA: Diagnosis not present

## 2023-01-08 DIAGNOSIS — C50112 Malignant neoplasm of central portion of left female breast: Secondary | ICD-10-CM

## 2023-01-08 DIAGNOSIS — Z85038 Personal history of other malignant neoplasm of large intestine: Secondary | ICD-10-CM | POA: Insufficient documentation

## 2023-01-08 LAB — CMP (CANCER CENTER ONLY)
ALT: 11 U/L (ref 0–44)
AST: 16 U/L (ref 15–41)
Albumin: 4.3 g/dL (ref 3.5–5.0)
Alkaline Phosphatase: 50 U/L (ref 38–126)
Anion gap: 6 (ref 5–15)
BUN: 17 mg/dL (ref 8–23)
CO2: 28 mmol/L (ref 22–32)
Calcium: 9 mg/dL (ref 8.9–10.3)
Chloride: 108 mmol/L (ref 98–111)
Creatinine: 0.6 mg/dL (ref 0.44–1.00)
GFR, Estimated: >60 mL/min (ref >60)
Glucose, Bld: 133 mg/dL — ABNORMAL HIGH (ref 70–99)
Potassium: 4.1 mmol/L (ref 3.5–5.1)
Sodium: 142 mmol/L (ref 135–145)
Total Bilirubin: 0.8 mg/dL (ref 0.3–1.2)
Total Protein: 6.9 g/dL (ref 6.5–8.1)

## 2023-01-08 LAB — CBC WITH DIFFERENTIAL (CANCER CENTER ONLY)
Abs Immature Granulocytes: 0.01 10*3/uL (ref 0.00–0.07)
Basophils Absolute: 0.1 10*3/uL (ref 0.0–0.1)
Basophils Relative: 1 %
Eosinophils Absolute: 0.1 10*3/uL (ref 0.0–0.5)
Eosinophils Relative: 1 %
HCT: 45.5 % (ref 36.0–46.0)
Hemoglobin: 14.5 g/dL (ref 12.0–15.0)
Immature Granulocytes: 0 %
Lymphocytes Relative: 21 %
Lymphs Abs: 1 10*3/uL (ref 0.7–4.0)
MCH: 30.1 pg (ref 26.0–34.0)
MCHC: 31.9 g/dL (ref 30.0–36.0)
MCV: 94.6 fL (ref 80.0–100.0)
Monocytes Absolute: 0.5 10*3/uL (ref 0.1–1.0)
Monocytes Relative: 10 %
Neutro Abs: 3.3 10*3/uL (ref 1.7–7.7)
Neutrophils Relative %: 67 %
Platelet Count: 179 10*3/uL (ref 150–400)
RBC: 4.81 MIL/uL (ref 3.87–5.11)
RDW: 13.6 % (ref 11.5–15.5)
WBC Count: 5 10*3/uL (ref 4.0–10.5)
nRBC: 0 % (ref 0.0–0.2)

## 2023-01-08 NOTE — Progress Notes (Signed)
[facility]   Telephone:(336) [phone] [phone]) [phone]   Clinic Follow up Note   Patient Care Team: Pincus Sanes, MD as PCP - General (Internal Medicine) Laurey Morale, MD as PCP - Cardiology (Cardiology) Regan Lemming, MD as PCP - Electrophysiology (Cardiology) [name], MD as Consulting Physician (Cardiology) Louis Meckel, MD (Inactive) as Consulting Physician (Gastroenterology) Van Clines, MD as Consulting Physician (Neurology) Malachy Mood, MD as Consulting Physician (Hematology) Almond Lint, MD as Consulting Physician (General Surgery) Pershing Proud, RN as Oncology Nurse Navigator Donnelly Angelica, RN as Oncology Nurse Navigator Pollyann Samples, NP as Nurse Practitioner (Nurse Practitioner)  Date of Service:  01/08/2023  CHIEF COMPLAINT: f/u of left breast cancer   CURRENT THERAPY: Tamoxifen, starting 09/13/2021    ASSESSMENT: Anita Duke is a 82 y.o. 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 Otherwise, no mutations. -she underwent lumpectomy on 05/26/21 with Dr. Donell Beers showing: IDC, 1.5 cm, grade 2; DCIS, intermediate grade; resection margins negative. -she began tamoxifen on 09/13/21. She reports weight gain and hot flashes. -she is clinically doing well from a breast cancer standpoint. Labs reviewed, overall no concern. Physical exam was unremarkable. There is no clinical concern for recurrence. -she will be due for annual mammogram in 03/2023; I ordered for her today. -Continue tamoxifen, we discussed healthy diet and exercise, and weight watch.  2. Osteopenia -Her most recent DEXA was 03/31/21 showing -1.4.  She is due for repeated DEXA scan later this year -We discussed  tamoxifen can strengthen the bones.   3. Depression -she previously weaned off Wellbutrin in order to start tamoxifen. However, she did not start a different medication, thus her depression worsened. -NP Lacie started her on Effexor on 10/04/21, she is tolerating well.  Will continue.   4. H/o Cecal Adenocarcinoma, stage I p(T2, N0) -found on screening colonoscopy -s/p partial colectomy on 12/03/12 by Dr. Donell Beers -evaluated by Dr. Darnelle Catalan, no further treatment needed.       PLAN: -lab reviewed -Continue Tamoxifen for 5 years -lab and f/u  with Np Lacie in 6 months     SUMMARY OF ONCOLOGIC HISTORY: Oncology History Overview Note  Cancer Staging Malignant neoplasm of central portion of left breast (HCC) Staging form: Breast, AJCC 8th Edition - Clinical stage from 04/11/2021: Stage IA (cT1c, cN0, cM0, G2, ER+, PR+, HER2-) - Signed by Malachy Mood, MD on 04/27/2021    Malignant neoplasm of central portion of left breast (HCC)  04/09/2021 Mammogram   Bilateral Diagnostic, and Left Breast US  FINDINGS: Spiculated mass in the left breast containing calcifications at 3 o'clock, 6 cm from the nipple measuring 11 x 9 x 9 mm. No axillary adenopathy.     04/11/2021 Cancer Staging   Staging form: Breast, AJCC 8th Edition - Clinical stage from 04/11/2021: Stage IA (cT1c, cN0, cM0, G2, ER+, PR+, HER2-) - Signed by Malachy Mood, MD on 04/27/2021 Stage prefix: Initial diagnosis Histologic grading system: 3 grade system   04/11/2021 Pathology Results   Diagnosis Breast, left, needle core biopsy, 3:00 - INVASIVE DUCTAL CARCINOMA, SEE COMMENT. - DUCTAL CARCINOMA IN SITU. Microscopic Comment The carcinoma appears grade 2.  PROGNOSTIC INDICATORS Results: IMMUNOHISTOCHEMICAL AND MORPHOMETRIC ANALYSIS PERFORMED MANUALLY The tumor cells are NEGATIVE  for Her2 (1+). Estrogen Receptor: 100%, POSITIVE, STRONG STAINING INTENSITY Progesterone Receptor: 100%, POSITIVE, STRONG STAINING INTENSITY Proliferation  Marker Ki67: 5%   04/27/2021 Initial Diagnosis   Malignant neoplasm of central portion of left breast (HCC)   05/17/2021 Genetic Testing   Hereditary cancer genetic testing: carrier result.  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  No other pathogenic variants detected in [facility] CancerNext-Expanded +RNAinsight Panel. The report date is May 17, 2021.    The CancerNext-Expanded gene panel offered by [facility] and includes sequencing, rearrangement, and RNA analysis for the following 77 genes: AIP, ALK, APC, ATM, AXIN2, BAP1, BARD1, BLM, BMPR1A, BRCA1, BRCA2, BRIP1, CDC73, CDH1, CDK4, CDKN1B, CDKN2A, CHEK2, CTNNA1, DICER1, FANCC, FH, FLCN, GALNT12, KIF1B, LZTR1, MAX, MEN1, MET, MLH1, MSH2, MSH3, MSH6, MUTYH, NBN, NF1, NF2, NTHL1, PALB2, PHOX2B, PMS2, POT1, PRKAR1A, PTCH1, PTEN, RAD51C, RAD51D, RB1, RECQL, RET, SDHA, SDHAF2, SDHB, SDHC, SDHD, SMAD4, SMARCA4, SMARCB1, SMARCE1, STK11, SUFU, TMEM127, TP53, TSC1, TSC2, VHL and XRCC2 (sequencing and deletion/duplication); EGFR, EGLN1, HOXB13, KIT, MITF, PDGFRA, POLD1, and POLE (sequencing only); EPCAM and GREM1 (deletion/duplication only).    05/26/2021 Definitive Surgery   FINAL MICROSCOPIC DIAGNOSIS:   A. BREAST, LEFT, LUMPECTOMY:  - Invasive ductal carcinoma, 1.5 cm, grade 2  - Ductal carcinoma in situ, intermediate grade  - Resection margins are negative for carcinoma; closest is the posterior margin at 0.3 cm  - Patchy atypical ductal hyperplasia  - Fibrocystic change with usual ductal hyperplasia, apocrine metaplasia and calcifications  - Biopsy site changes  - See oncology table    05/26/2021 Cancer Staging   Staging form: Breast, AJCC 8th Edition - Pathologic stage from 05/26/2021: Stage Unknown (pT1c, pNX, cM0, G2, ER+, PR+, HER2-) - Signed by Malachy Mood, MD on 06/30/2021 Stage prefix: Initial diagnosis Multigene prognostic  tests performed: None Histologic grading system: 3 grade system Residual tumor (R): R0 - None   10/04/2021 Survivorship   SCP delivered in person by Santiago Glad, NP      INTERVAL HISTORY:  Anita Duke is here for a follow up of left breast cancer . She was last seen by NP Lacie on 07/13/2022. She presents to the clinic alone.Pt report she was hospitalized back in March for pneumonia. Pt deny having any problems from taking Tamoxifen.       All other systems were reviewed with the patient and are negative.  MEDICAL HISTORY:  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   Breast cancer (HCC)    CAD (coronary artery disease)    LIMA to the LAD at time of mitral valve repair / LIMA atretic,, February, 2011   Cataracts, bilateral    removed bilat    Cecal cancer 2014   colon   CHF (congestive heart failure)    Chronic systolic dysfunction of left ventricle 05/21/2013   Clotting disorder    21 yrs ago blood clot in heart    Colon cancer (HCC)    Colon polyp, hyperplastic    Diabetes mellitus type II, controlled 03/02/2009   Diabetic neuropathy 02/21/2018  [facility] [place]   Diverticulosis of colon    Diverticulosis of large intestine 08/04/2002   Ejection fraction < 50%    EF 30%, New diagnosis   february, 2011, etiology not clear, consider rate related tachycardia, and not use carvedilol, catheterization no constriction, /    EF 55-60% echo, June, 2011 /     Beta blocker stopped May, 2012 with reactive airway disease.  Patient does not tolerate metoprolol.  Carvedilol stopped  /   EF 55%, echo, February, 2012  //   EF 40-45%, septal dyssynergy, hypokinesis of  the a   Essential hypertension 07/07/2008   Family history of adverse reaction to anesthesia    sister had a hard time waking up after anesthesia   Family history of colon cancer 04/29/2021   GERD (gastroesophageal reflux disease)    Heart murmur    Hemorrhoids, internal    High cholesterol    History of CABG    Hyperlipidemia 07/07/2008   Lumbar radiculopathy, acute 06/17/2018   Major depressive disorder 07/19/2018   Mitral regurgitation    a. s/p repair with LAA ligation and MAZE at time of surgery   Mitral stenosis    Mild, February, 2011, post mitral valve repair / mild, echo, June, 2011  //  Mild functional mitral stenosis, echo, February, 2012  //   mild stenosis, echo, December, 2013  //  mild, echo, August, 2014   //   Mild, echo, April, 2016    Osteopenia    left hip    Pacemaker-Medtronic 05/20/2012   Pacemaker placed September, 2013   //   upgraded to biventricular CRT pacemaker November 04, 2013    Permanent atrial fibrillation    a. s/p MAZE b. s/p AVN ablation   Personal history of colonic polyps 04/29/2021   PONV (postoperative nausea and vomiting)    Pulmonary HTN    QT prolongation    Tikosyn and Effexor. QT prolonged October 13, 2011, peak is in dose reduced from 500  to -250 twice a day   Right ventricular dysfunction    Mild to moderate, echo, February, 2011 / normalized echo, June, 2011 /  RV normal, echo, February, 2012  //   right ventricle reported as good echo, December, 2013    S/P mitral valve repair    [facility] / Maze procedure/ atrial appendage removed were tied off    Sick sinus syndrome    s/p Medtronic dual chamber PPM 05/17/12    Sleep difficulties 09/13/2019   Vitamin D deficiency 02/20/2014    SURGICAL HISTORY: Past Surgical History:  Procedure Laterality Date   ABDOMINAL HYSTERECTOMY  ?2002   AV NODE ABLATION N/A 05/28/2012   Procedure: AV NODE ABLATION;  Surgeon: Hillis Range, MD;  Location: [facility] CATH LAB;  Service: Cardiovascular;   Laterality: N/A;   BI-VENTRICULAR PACEMAKER UPGRADE N/A 11/04/2013   upgrade of previously implanted dual chamber pacemaker to STJ Quadra Allure CRTP by Dr Johney Frame   BIV PACEMAKER GENERATOR CHANGEOUT N/A 06/22/2022   Procedure: BIV PACEMAKER GENERATOR CHANGEOUT;  Surgeon: Regan Lemming, MD;  Location: [facility] INVASIVE CV LAB;  Service: Cardiovascular;  Laterality: N/A;   BREAST EXCISIONAL BIOPSY Left over 10 years ago   benign   BREAST LUMPECTOMY Left 05/26/2021   Procedure: LEFT BREAST LUMPECTOMY;  Surgeon: Almond Lint, MD;  Location: [facility] OR;  Service: General;  Laterality: Left;   BREAST SURGERY     CARDIOVERSION  02/08/2012   Procedure: CARDIOVERSION;  Surgeon: [name], MD;  Location: [facility] OR;  Service: Cardiovascular;  Laterality: N/A;   CARDIOVERSION  02/29/2012   Procedure: CARDIOVERSION;  Surgeon: [name], MD;  Location: [facility] OR;  Service: Cardiovascular;  Laterality: N/A;   CARDIOVERSION  05/15/2012   Procedure: CARDIOVERSION;  Surgeon: [name], MD;  Location: [facility] ENDOSCOPY;  Service: Cardiovascular;  Laterality: N/A;   CATARACT EXTRACTION, BILATERAL  2017   CHOLECYSTECTOMY  1995   COLON SURGERY  2014   right hemicolectomy    COLONOSCOPY     CORONARY ARTERY BYPASS GRAFT  2001   CABG X1 "at time of mitral valve repair" (12/04/2012   DIAGNOSTIC LAPAROSCOPIC LIVER BIOPSY Left 12/03/2012   Procedure: DIAGNOSTIC LAPAROSCOPIC LIVER BIOPSY;  Surgeon: Almond Lint, MD;  Location: [facility] OR;  Service: General;  Laterality: Left;   EYE SURGERY     bilateral cataract surgery   LAPAROSCOPIC RIGHT HEMI COLECTOMY  12/03/2012   Procedure: LAPAROSCOPIC RIGHT HEMI COLECTOMY;  Surgeon: Almond Lint, MD;  Location: [facility] OR;  Service: General;;   LUMBAR LAMINECTOMY/ DECOMPRESSION WITH MET-RX Left 06/17/2018   Procedure: left Lumbar three-fourextraforaminal Microdiscectomy with Met-Rx;  Surgeon: Barnett Abu, MD;  Location: [facility] OR;  Service: Neurosurgery;  Laterality: Left;   MAZE  2001    w/ MVR & CABG   MITRAL VALVE REPAIR  2001   "anterior and posterior leaflets" (12/04/2012)   PERMANENT PACEMAKER INSERTION N/A 05/17/2012   MDT Adapta L implanted by Dr Johney Frame for tachy/brady syndrome   POLYPECTOMY     RADIOACTIVE SEED GUIDED EXCISIONAL BREAST BIOPSY Right 07/04/2022   Procedure: RADIOACTIVE SEED GUIDED EXCISIONAL RIGHT BREAST BIOPSY;  Surgeon: Almond Lint, MD;  Location: [facility] OR;  Service: General;  Laterality: Right;   TONSILLECTOMY AND ADENOIDECTOMY  ~ 1950   UMBILICAL HERNIA REPAIR N/A 12/03/2012   Procedure: HERNIA REPAIR UMBILICAL ADULT;  Surgeon: Almond Lint, MD;  Location: [facility] OR;  Service: General;  Laterality: N/A;    I have reviewed the social history and family history with the patient and they are unchanged from previous note.  ALLERGIES:  is allergic to demerol [meperidine], morphine, januvia [sitagliptin], metformin and related, and tetanus toxoid.  MEDICATIONS:  Current Outpatient Medications  Medication Sig Dispense Refill   amoxicillin (AMOXIL) 500 MG capsule TAKE 4 CAPSULES PRIOR TO DENTAL APPOINTMENTS AS DIRECTED (Patient not taking: Reported on 12/27/2022) 20 capsule 0   atorvastatin (LIPITOR) 40 MG tablet Take 1 tablet (40 mg total) by mouth daily. 90 tablet 3   benzonatate (TESSALON) 200 MG capsule Take 1 capsule by mouth 3  times daily as needed for cough. 30 capsule 1   carvedilol (COREG) 12.5 MG tablet Take 1 tablet (12.5 mg total) by mouth 2 (two) times daily with a meal. 180 tablet 3   cetirizine (ZYRTEC) 10 MG tablet Take 10 mg by mouth daily.     cholecalciferol (VITAMIN D) 1000 UNITS tablet Take 1,000 Units by mouth daily.      cyanocobalamin (VITAMIN B12) 500 MCG tablet Take 500 mcg by mouth daily.     empagliflozin (JARDIANCE) 25 MG TABS tablet Take 1 tablet (25 mg total) by mouth daily. 30 tablet 3   ENTRESTO 24-26 MG TAKE 1 TABLET TWICE A DAY 60 tablet 6   fish oil-omega-3 fatty acids 1000 MG capsule Take 1 g by mouth 2 (two) times  daily.     Polyethyl Glycol-Propyl Glycol (SYSTANE) 0.4-0.3 % SOLN Place 1 drop into both eyes daily.  Semaglutide (RYBELSUS) 3 MG TABS Take 1 tablet (3 mg total) by mouth daily. 30 tablet 2   sodium chloride (OCEAN) 0.65 % SOLN nasal spray Place 1 spray into both nostrils 2 (two) times daily.     spironolactone (ALDACTONE) 25 MG tablet Take 1 tablet (25 mg total) by mouth daily. 90 tablet 3   tamoxifen (NOLVADEX) 20 MG tablet Take 1 tablet (20 mg total) by mouth daily. 90 tablet 2   venlafaxine XR (EFFEXOR-XR) 37.5 MG 24 hr capsule Take 1 capsule (37.5 mg total) by mouth daily. 90 capsule 2   warfarin (COUMADIN) 2.5 MG tablet TAKE 1 TABLET BY MOUTH DAILY OR AS DIRECTED BY ANTICOAGULATION  CLINIC (Patient taking differently: Take 2.5-3.75 mg by mouth every evening. 1 tablet everyday except on Friday patient takes 1.5 tablets to equal 3.75 mg) 104 tablet 3   No current facility-administered medications for this visit.    PHYSICAL EXAMINATION: ECOG PERFORMANCE STATUS: 0 - Asymptomatic  Vitals:   01/08/23 1044  BP: (!) 160/64  Pulse: 78  Resp: 18  Temp: 97.9 F (36.6 C)  SpO2: 98%   Wt Readings from Last 3 Encounters:  01/08/23 145 lb 8 oz (66 kg)  12/27/22 145 lb 2 oz (65.8 kg)  12/05/22 142 lb (64.4 kg)     GENERAL:alert, no distress and comfortable SKIN: skin color normal, no rashes or significant lesions EYES: normal, Conjunctiva are pink and non-injected, sclera clear  NEURO: alert & oriented x 3 with fluent speech NECK:(-) supple, thyroid normal size, non-tender, without nodularity LYMPH:(-)  no palpable lymphadenopathy in the cervical, axillary  ABDOMEN:(-) abdomen soft, (-) non-tender and normal bowel sounds BREAST: RT breast exam no palpable mass, LT breast lumpectomy scar tissue, no palpable mass breast exam benign.    LABORATORY DATA:  I have reviewed the data as listed    Latest Ref Rng & Units 01/08/2023   10:09 AM 11/29/2022   12:51 AM 11/28/2022    1:02 AM   CBC  WBC 4.0 - 10.5 K/uL 5.0  6.5  6.5   Hemoglobin 12.0 - 15.0 g/dL 16.1  09.6  04.5   Hematocrit 36.0 - 46.0 % 45.5  41.0  46.1   Platelets 150 - 400 K/uL 179  155  175         Latest Ref Rng & Units 01/08/2023   10:09 AM 11/29/2022   12:51 AM 11/28/2022    1:02 AM  CMP  Glucose 70 - 99 mg/dL 409  811  914   BUN 8 - 23 mg/dL 17  20  24    Creatinine 0.44 - 1.00 mg/dL 7.82  9.56  2.13   Sodium 135 - 145 mmol/L 142  135  135   Potassium 3.5 - 5.1 mmol/L 4.1  4.3  3.7   Chloride 98 - 111 mmol/L 108  102  99   CO2 22 - 32 mmol/L 28  24  27    Calcium 8.9 - 10.3 mg/dL 9.0  8.8  9.0   Total Protein 6.5 - 8.1 g/dL 6.9     Total Bilirubin 0.3 - 1.2 mg/dL 0.8     Alkaline Phos 38 - 126 U/L 50     AST 15 - 41 U/L 16     ALT 0 - 44 U/L 11         RADIOGRAPHIC STUDIES: I have personally reviewed the radiological images as listed and agreed with the findings in the report. No results found.  No orders of the defined types were placed in this encounter.  All questions were answered. The patient knows to call the clinic with any problems, questions or concerns. No barriers to learning was detected. The total time spent in the appointment was 25 minutes.     Malachy Mood, MD 01/08/2023   Carolin Coy, CMA, am acting as scribe for Malachy Mood, MD.   I have reviewed the above documentation for accuracy and completeness, and I agree with the above.

## 2023-01-09 ENCOUNTER — Ambulatory Visit (INDEPENDENT_AMBULATORY_CARE_PROVIDER_SITE_OTHER): Payer: Medicare HMO

## 2023-01-09 DIAGNOSIS — Z7901 Long term (current) use of anticoagulants: Secondary | ICD-10-CM

## 2023-01-09 LAB — POCT INR: INR: 2.1 (ref 2.0–3.0)

## 2023-01-09 NOTE — Patient Instructions (Addendum)
Pre visit review using our clinic review tool, if applicable. No additional management support is needed unless otherwise documented below in the visit note.  Continue 1 tablet daily except take 1 1/2 tablets on Fridays and recheck in 5 weeks.

## 2023-01-09 NOTE — Progress Notes (Signed)
Continue 1 tablet daily except take 1 1/2 tablets on Fridays and recheck in 5 weeks.

## 2023-01-17 ENCOUNTER — Other Ambulatory Visit: Payer: Self-pay

## 2023-01-17 ENCOUNTER — Telehealth: Payer: Self-pay | Admitting: Internal Medicine

## 2023-01-17 DIAGNOSIS — E785 Hyperlipidemia, unspecified: Secondary | ICD-10-CM

## 2023-01-17 MED ORDER — RYBELSUS 3 MG PO TABS: 3.0000 mg | ORAL_TABLET | Freq: Every day | ORAL | 2 refills | Status: DC

## 2023-01-17 NOTE — Telephone Encounter (Signed)
Prescription Request  01/17/2023  LOV: 12/27/2022  What is the name of the medication or equipment? Semaglutide (RYBELSUS) 3 MG TABS   Have you contacted your pharmacy to request a refill? No   Which pharmacy would you like this sent to?  [facility] - [place], [place] - [address] AT Portal to Registered [facility] [address] [place] [address] Phone: [phone] Fax: [phone]    Patient notified that their request is being sent to the clinical staff for review and that they should receive a response within 2 business days.   Please advise at Mobile [phone] (mobile)     Patient said she has 12 pills left and would like to request a refill in time for it to be delivered since she uses a Engineer, building services.

## 2023-01-17 NOTE — Telephone Encounter (Signed)
Sent in today 

## 2023-01-18 ENCOUNTER — Other Ambulatory Visit: Payer: Self-pay

## 2023-01-18 ENCOUNTER — Other Ambulatory Visit (HOSPITAL_COMMUNITY): Payer: Self-pay

## 2023-01-18 DIAGNOSIS — E1169 Type 2 diabetes mellitus with other specified complication: Secondary | ICD-10-CM

## 2023-01-18 MED ORDER — RYBELSUS 3 MG PO TABS
3.0000 mg | ORAL_TABLET | Freq: Every day | ORAL | 2 refills | Status: DC
  Filled 2023-01-18 – 2023-01-20 (×2): qty 30, 30d supply, fill #0

## 2023-01-18 NOTE — Telephone Encounter (Signed)
30 day sent in for patient today

## 2023-01-18 NOTE — Telephone Encounter (Signed)
Patient called and said she needs the prescription changed to a 30 day supply. She said the 90 day supply is too expensive.

## 2023-01-20 ENCOUNTER — Other Ambulatory Visit (HOSPITAL_COMMUNITY): Payer: Self-pay

## 2023-01-22 ENCOUNTER — Encounter (HOSPITAL_COMMUNITY): Payer: Self-pay | Admitting: Cardiology

## 2023-01-22 ENCOUNTER — Ambulatory Visit (HOSPITAL_COMMUNITY)
Admission: RE | Admit: 2023-01-22 | Discharge: 2023-01-22 | Disposition: A | Discharge status: Home / Self Care | Payer: Medicare HMO | Source: Ambulatory Visit | Attending: Cardiology | Admitting: Cardiology

## 2023-01-22 ENCOUNTER — Other Ambulatory Visit (HOSPITAL_COMMUNITY): Payer: Medicare HMO

## 2023-01-22 VITALS — BP 104/60 | HR 70 | Wt 145.0 lb

## 2023-01-22 DIAGNOSIS — Z952 Presence of prosthetic heart valve: Secondary | ICD-10-CM | POA: Insufficient documentation

## 2023-01-22 DIAGNOSIS — F32A Depression, unspecified: Secondary | ICD-10-CM | POA: Insufficient documentation

## 2023-01-22 DIAGNOSIS — I442 Atrioventricular block, complete: Secondary | ICD-10-CM | POA: Insufficient documentation

## 2023-01-22 DIAGNOSIS — J449 Chronic obstructive pulmonary disease, unspecified: Secondary | ICD-10-CM | POA: Insufficient documentation

## 2023-01-22 DIAGNOSIS — I2510 Atherosclerotic heart disease of native coronary artery without angina pectoris: Secondary | ICD-10-CM | POA: Insufficient documentation

## 2023-01-22 DIAGNOSIS — I4821 Permanent atrial fibrillation: Secondary | ICD-10-CM | POA: Insufficient documentation

## 2023-01-22 DIAGNOSIS — Z853 Personal history of malignant neoplasm of breast: Secondary | ICD-10-CM | POA: Insufficient documentation

## 2023-01-22 DIAGNOSIS — I519 Heart disease, unspecified: Secondary | ICD-10-CM | POA: Diagnosis not present

## 2023-01-22 DIAGNOSIS — E119 Type 2 diabetes mellitus without complications: Secondary | ICD-10-CM | POA: Insufficient documentation

## 2023-01-22 DIAGNOSIS — Z950 Presence of cardiac pacemaker: Secondary | ICD-10-CM | POA: Insufficient documentation

## 2023-01-22 DIAGNOSIS — I252 Old myocardial infarction: Secondary | ICD-10-CM | POA: Insufficient documentation

## 2023-01-22 DIAGNOSIS — I428 Other cardiomyopathies: Secondary | ICD-10-CM | POA: Insufficient documentation

## 2023-01-22 DIAGNOSIS — Z7984 Long term (current) use of oral hypoglycemic drugs: Secondary | ICD-10-CM | POA: Insufficient documentation

## 2023-01-22 DIAGNOSIS — Z7901 Long term (current) use of anticoagulants: Secondary | ICD-10-CM | POA: Diagnosis not present

## 2023-01-22 DIAGNOSIS — I5022 Chronic systolic (congestive) heart failure: Secondary | ICD-10-CM | POA: Diagnosis not present

## 2023-01-22 DIAGNOSIS — Z79899 Other long term (current) drug therapy: Secondary | ICD-10-CM | POA: Insufficient documentation

## 2023-01-22 DIAGNOSIS — Z85038 Personal history of other malignant neoplasm of large intestine: Secondary | ICD-10-CM | POA: Insufficient documentation

## 2023-01-22 LAB — CBC
HCT: 46.4 % — ABNORMAL HIGH (ref 36.0–46.0)
Hemoglobin: 14.8 g/dL (ref 12.0–15.0)
MCH: 30.3 pg (ref 26.0–34.0)
MCHC: 31.9 g/dL (ref 30.0–36.0)
MCV: 94.9 fL (ref 80.0–100.0)
Platelets: 170 10*3/uL (ref 150–400)
RBC: 4.89 MIL/uL (ref 3.87–5.11)
RDW: 13.3 % (ref 11.5–15.5)
WBC: 5.4 10*3/uL (ref 4.0–10.5)
nRBC: 0 % (ref 0.0–0.2)

## 2023-01-22 LAB — BASIC METABOLIC PANEL
Anion gap: 11 (ref 5–15)
BUN: 17 mg/dL (ref 8–23)
CO2: 24 mmol/L (ref 22–32)
Calcium: 8.8 mg/dL — ABNORMAL LOW (ref 8.9–10.3)
Chloride: 104 mmol/L (ref 98–111)
Creatinine, Ser: 0.61 mg/dL (ref 0.44–1.00)
GFR, Estimated: >60 mL/min (ref >60)
Glucose, Bld: 132 mg/dL — ABNORMAL HIGH (ref 70–99)
Potassium: 3.8 mmol/L (ref 3.5–5.1)
Sodium: 139 mmol/L (ref 135–145)

## 2023-01-22 LAB — BRAIN NATRIURETIC PEPTIDE: B Natriuretic Peptide: 110.7 pg/mL — ABNORMAL HIGH (ref 0.0–100.0)

## 2023-01-22 LAB — LIPID PANEL
Cholesterol: 139 mg/dL (ref 0–200)
HDL: 60 mg/dL (ref >40)
LDL Cholesterol: 63 mg/dL (ref 0–99)
Total CHOL/HDL Ratio: 2.3 RATIO
Triglycerides: 78 mg/dL (ref <150)
VLDL: 16 mg/dL (ref 0–40)

## 2023-01-22 NOTE — Patient Instructions (Signed)
No changes to your medications.  Labs done today, your results will be available in MyChart, we will contact you for abnormal readings.  Your provider has order a CT angiogram for you. ONCE APPROVED BY YOUR INSURANCE COMPANY YOU WILL BE CALLED TO HAVE THE TEST ARRANGED.   Your physician recommends that you schedule a follow-up appointment in: 4 months  If you have any questions or concerns before your next appointment please send [name] a message through [place] or call our office at [phone].    TO LEAVE A MESSAGE FOR THE NURSE SELECT OPTION 2, PLEASE LEAVE A MESSAGE INCLUDING: YOUR NAME DATE OF BIRTH CALL BACK NUMBER REASON FOR CALL**this is important as we prioritize the call backs  YOU WILL RECEIVE A CALL BACK THE SAME DAY AS LONG AS YOU CALL BEFORE 4:00 PM  At the [facility], you and your health needs are our priority. As part of our continuing mission to provide you with exceptional heart care, we have created designated Provider Care Teams. These Care Teams include your primary Cardiologist (physician) and Advanced Practice Providers (APPs- Physician Assistants and Nurse Practitioners) who all work together to provide you with the care you need, when you need it.   You may see any of the following providers on your designated Care Team at your next follow up: Dr Arvilla Meres Dr Marca Ancona Dr. Marcos Eke, NP Robbie Lis, [place] [facility] [place], [place] Brynda Peon, NP Karle Plumber, PharmD   Please be sure to bring in all your medications bottles to every appointment.    Thank you for choosing [facility]

## 2023-01-23 NOTE — Progress Notes (Signed)
[facility] Note    Primary Care: Dr. Cheryll Cockayne HF Cardiologist: Dr. Shirlee Latch EP: Dr. Johney Frame   HPI: Anita Duke is a 82 y.o. with a past medical history of pulmonary HTN, mitral regurgitation s/p mitral valve repair, MAZE procedure at the [facility] in 2001, at that time she also had a LIMA to LAD graft placed. Also with history of permanent Afib on warfarin s/p AVN ablation. At one time she was on Tikosyn which was stopped for QT prolongation.  She has chronic systolic CHF (EF 40% in 12/2014).  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 Last cath in 2014 with atretic LIMA to LAD but nonobstructive disease in LAD.   Echo in 10/18 showed EF improved to 55-60% with mild to moderate RV dysfunction.   Echo in 7/20 showed EF 50% with basal inferior and inferolateral akinesis, mildly decreased RV systolic function, s/p MV repair with trivial MR, no MS, normal IVC.  Echo in 1/22 showed EF 45-50% with basal inferior and basal inferolateral akinesis, mildly decreased RV systolic function, s/p MV repair with trivial MR and mean gradient 4 mmHg.   Patient developed breast cancer in 2022 and is status post lumpectomy.  Echo 12/22 showed EF 45-50%, diffuse hypokinesis, mildly decreased RV function, repaired mitral valve with trivial MR no MS, normal IVC.   Patient was admitted in 3/24 with metapneumovirus PNA.  HS-troponin was found to be elevated to 3029 and she had some chest pain, suspected NSTEMI.  Coronary CTA was done, showing totally occluded LCx with collaterals, atretic LIMA but patent LAD.  There was a possible small ulceration in the ascending aorta.  Echo in 3/24 showed EF 50-55%, akinesis of the basal -mid inferior and inferolateral walls, moderate RV dysfunction, s/p MV repair with minimal MR.   Today she returns for HF follow up.  No chest pain.  She feels like she gets more fatigued than in the past but denies exertional dyspnea.  She is short of breath walking up  stairs.  No orthopnea/PND.  Weight stable. She has just started semaglutide.   ECG (personally reviewed): atrial fibrillation, BiV paced  St Jude device interrogation: 96% BIV pacing, stable thoracic impedance.   Labs (5/18): K 4.7, creatinine 0.62, LDL 71, HDL 61 Labs (10/18): K 3.5, creatinine 0.59, TSH normal Labs (7/20): K 3.9, creatinine 0.69, hgb 15.1, LDL 37, TSH normal Labs (7/21): K 3.8, creatinine 0.64, LDL 52, HDL 64 Labs (1/22): K 4.1, creatinine 0.68, LDL 40 Labs (7/22): LDL 47 Labs (10/22): K 3.6, creatinine 0.71 Labs (1/23): LDL 60, HDL 59 Labs (4/23): K 4.0, creatinine 0.61, hgb 13.2 Labs (11/23): K 4.3, creatinine 0.64 Labs (3/24): K 4.3, creatinine 0.72  ROS: All systems reviewed and negative except as per HPI.  PMH: 1. Type II diabetes. 2. COPD 3. Depression 4. H/o colon cancer 5. CAD: LIMA-LAD in 4/01 with MV repair.   - LHC (8/14): LIMA-LAD atretic, 50-60% mid LAD stenosis.  - Coronary CTA (3/24): totally occluded LCx with collaterals, atretic LIMA but patent LAD with 50-69% proximal stenosis.  6. Mitral regurgitation: S/p MV repair at the [facility] in 4/01.  She also had LAA ligation and MAZE. Echo in 7/20 with trivial MR and no MS.  7. 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   - St Jude CRT-P device.  - Echo (4/16): EF 40%, mild LV dilation, normal RV size with mild to moderately decreased systolic function, s/p mitral valve repair with mild  MR, no significant stenosis.  - Echo (10/18): EF 55-60%, severe LAE, RV with mild to moderate systolic dysfunction, mild-moderate TR, PASP 35 mmHg.  - Echo (7/20): EF 50% with basal inferior and inferolateral akinesis, mildly decreased RV systolic function, s/p MV repair with trivial MR, no MS, normal IVC.  - Echo (1/22): EF 45-50% with basal inferior and basal inferolateral akinesis, mildly decreased RV systolic function, s/p MV repair with trivial MR and mean gradient 4 mmHg. - Echo (12/22): EF  45-50%, diffuse hypokinesis, mildly decreased RV function, repaired mitral valve with trivial MR no MS, normal IVC.  - Echo (3/24): EF 50-55%, akinesis of the basal -mid inferior and inferolateral walls, moderate RV dysfunction, s/p MV repair with minimal MR.  8. Permanent atrial fibrillation: S/p MAZE in 4/01.  S/p AV nodal ablation in 9/13.   9. PFTs (1/19): No significant obstruction or restriction in the lungs.  10. Breast cancer: S/p lumpectomy in 2022.   ROS: All systems reviewed and negative except as per HPI.   Current Outpatient Medications  Medication Sig Dispense Refill   amoxicillin (AMOXIL) 500 MG capsule TAKE 4 CAPSULES PRIOR TO DENTAL APPOINTMENTS AS DIRECTED 20 capsule 0   atorvastatin (LIPITOR) 40 MG tablet Take 1 tablet (40 mg total) by mouth daily. 90 tablet 3   benzonatate (TESSALON) 200 MG capsule Take 1 capsule by mouth 3  times daily as needed for cough. 30 capsule 1   carvedilol (COREG) 12.5 MG tablet Take 1 tablet (12.5 mg total) by mouth 2 (two) times daily with a meal. 180 tablet 3   cetirizine (ZYRTEC) 10 MG tablet Take 10 mg by mouth daily.     cholecalciferol (VITAMIN D) 1000 UNITS tablet Take 1,000 Units by mouth daily.      cyanocobalamin (VITAMIN B12) 500 MCG tablet Take 500 mcg by mouth daily.     empagliflozin (JARDIANCE) 25 MG TABS tablet Take 1 tablet (25 mg total) by mouth daily. 30 tablet 3   ENTRESTO 24-26 MG TAKE 1 TABLET TWICE A DAY 60 tablet 6   fish oil-omega-3 fatty acids 1000 MG capsule Take 1 g by mouth 2 (two) times daily.     Polyethyl Glycol-Propyl Glycol (SYSTANE) 0.4-0.3 % SOLN Place 1 drop into both eyes daily.     Semaglutide (RYBELSUS) 3 MG TABS Take 1 tablet (3 mg total) by mouth daily. 30 tablet 2   sodium chloride (OCEAN) 0.65 % SOLN nasal spray Place 1 spray into both nostrils 2 (two) times daily.     spironolactone (ALDACTONE) 25 MG tablet Take 1 tablet (25 mg total) by mouth daily. 90 tablet 3   tamoxifen (NOLVADEX) 20 MG tablet  Take 1 tablet (20 mg total) by mouth daily. 90 tablet 2   venlafaxine XR (EFFEXOR-XR) 37.5 MG 24 hr capsule Take 1 capsule (37.5 mg total) by mouth daily. 90 capsule 2   warfarin (COUMADIN) 2.5 MG tablet TAKE 1 TABLET BY MOUTH DAILY OR AS DIRECTED BY ANTICOAGULATION  CLINIC (Patient taking differently: Take 2.5-3.75 mg by mouth every evening. 1 tablet everyday except on Friday patient takes 1.5 tablets to equal 3.75 mg) 104 tablet 3   No current facility-administered medications for this encounter.   Allergies  Allergen Reactions   Demerol [Meperidine] Nausea And Vomiting   Morphine Nausea And Vomiting   Januvia [Sitagliptin] Other (See Comments)    Headaches, did not feel well   Metformin And Related Diarrhea   Tetanus Toxoid Rash and Other (See Comments)    "  years ago"   Social History   Socioeconomic History   Marital status: Widowed    Spouse name: Not on file   Number of children: 0   Years of education: 12   Highest education level: High school graduate  Occupational History   Occupation: Retired    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    Employer: Engineer, maintenance (IT): [employer]   Tobacco Use   Smoking status: Never   Smokeless tobacco: Never  Vaping Use   Vaping Use: Never used  Substance and Sexual Activity   Alcohol use: No   Drug use: No   Sexual activity: Not Currently    Birth control/protection: Surgical  Other Topics Concern   Not on file  Social History Narrative   No caffeine   Right handed    Lives alone    Social Determinants of Health   Financial Resource Strain: Low Risk  (06/26/2022)   Overall Financial Resource Strain (CARDIA)    Difficulty of Paying Living Expenses: Not hard at all  Food Insecurity: No Food Insecurity (11/30/2022)   Hunger Vital Sign    Worried About Running Out of Food in the Last Year: Never true    Ran Out of Food in the Last Year: Never true  Transportation Needs: No Transportation Needs (11/30/2022)    PRAPARE - Administrator, Civil Service (Medical): No    Lack of Transportation (Non-Medical): No  Physical Activity: Sufficiently Active (06/26/2022)   Exercise Vital Sign    Days of Exercise per Week: 7 days    Minutes of Exercise per Session: 30 min  Stress: No Stress Concern Present (06/26/2022)   [employer] of Occupational Health - Occupational Stress Questionnaire    Feeling of Stress : Not at all  Social Connections: Socially Integrated (06/26/2022)   Social Connection and Isolation Panel [NHANES]    Frequency of Communication with Friends and Family: More than three times a week    Frequency of Social Gatherings with Friends and Family: More than three times a week    Attends Religious Services: More than 4 times per year    Active Member of [employer] or Organizations: Yes    Attends Engineer, structural: More than 4 times per year    Marital Status: Married  Catering manager Violence: Not At Risk (11/25/2022)   Humiliation, Afraid, Rape, and Kick questionnaire    Fear of Current or Ex-Partner: No    Emotionally Abused: No    Physically Abused: No    Sexually Abused: No   Family History  Problem Relation Age of Onset   Diabetes Mother    Kidney disease Mother    Thyroid disease Mother    Alzheimer's disease Mother        Symptom onset in late 84s; confirmed via autopsy   Depression Mother    Heart disease Father    Diabetes Father    Heart disease Sister        x 2   Colon cancer Maternal Aunt        dx 75s; mother's paternal half sister   Colon cancer Maternal Aunt        dx late 44s   Heart disease Paternal Uncle        x 6   Cancer Maternal Grandfather        unknown type; dx unknown age   Diabetes Paternal Grandmother    Diabetes Paternal Grandfather  Colon cancer Cousin    Brain cancer Nephew 52   Colon polyps Neg Hx    Adrenal disorder Neg Hx    Esophageal cancer Neg Hx    Rectal cancer Neg Hx    Stomach cancer Neg Hx    BP  104/60   Pulse 70   Wt 65.8 kg (145 lb)   SpO2 96%   BMI 28.32 kg/m   Wt Readings from Last 3 Encounters:  01/22/23 65.8 kg (145 lb)  01/08/23 66 kg (145 lb 8 oz)  12/27/22 65.8 kg (145 lb 2 oz)   PHYSICAL EXAM: General: NAD Neck: No JVD, no thyromegaly or thyroid nodule.  Lungs: Clear to auscultation bilaterally with normal respiratory effort. CV: Nondisplaced PMI.  Heart regular S1/S2, no S3/S4, no murmur.  No peripheral edema.  No carotid bruit.  Normal pedal pulses.  Abdomen: Soft, nontender, no hepatosplenomegaly, no distention.  Skin: Intact without lesions or rashes.  Neurologic: Alert and oriented x 3.  Psych: Normal affect. Extremities: No clubbing or cyanosis.  HEENT: Normal.   ASSESSMENT & PLAN: 1. Chronic systolic CHF: EF 29% in 2016. NICM most likely.  She has LIMA-LAD that is atretic, but no flow limiting disease in the LAD.  Cardiomyopathy may be due to RV pacing after AV nodal ablation in 9/13. She now has St Jude CRT-P device and EF was up to 55-60% on 10/18 echo. Echo in 7/20 showed EF about 50%.  Echo in 1/22 and in 12/22 showed stable EF 45-50%.  Echo in 3/24 showed EF 50-55%, akinesis of the basal -mid inferior and inferolateral walls, moderate RV dysfunction, s/p MV repair with minimal MR.  NYHA class II symptoms.  She is not volume overloaded on exam or by Corvue.  - Continue Coreg 12.5 mg bid.  - Continue Jardiance.  - Continue Entresto 24/26 bid. BMET/BNP today.  - She is doing fine off Lasix, not volume overloaded.   - Continue spironolactone 25 mg daily.   2. CAD: LIMA-LAD at time of MV surgery in 4/01, but LIMA-LAD atretic on 8/14 cath.  However, at that time her LAD had moderate stenosis that did not appear flow-limiting (50-60%).  Coronary CTA in 3/24 showed totally occluded LCx with collaterals, atretic LIMA but patent LAD with 50-69% proximal stenosis.  NSTEMI in 3/24 but coronary CTA showed no culprit lesion.  The occluded LCx was old as evidenced by  collaterals and also prior echoes have shown LCx territory wall motion abnormalities.  No chest pain.  - Continue atorvastatin 40 mg daily. Check lipids today.   - No ASA given use of warfarin 3. Complete heart block: She had an AV nodal ablation.  Now s/p CRT-P.   4. Permanent atrial fibrillation: Now s/p AV nodal ablation and BiV pacing. - Continue warfarin for anticoagulation. CBC today.  - She could swich anticoagulation to Eliquis, data suggests ok to use DOAC with valve repair, bioprosthetic valve.  However, she has wanted to continue warfarin as she has been on it stably x years.  5. DM: Continue Jardiance.  6. S/p MV repair: Stable mitral valve repair on 3/24 echo.   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  - Order CTA chest to fully assess thoracic aorta.   Follow up in 4 months with APP  Marca Ancona, MD 01/23/23

## 2023-02-05 ENCOUNTER — Encounter: Payer: Self-pay | Admitting: Cardiology

## 2023-02-13 ENCOUNTER — Ambulatory Visit (INDEPENDENT_AMBULATORY_CARE_PROVIDER_SITE_OTHER): Payer: Medicare HMO

## 2023-02-13 DIAGNOSIS — Z7901 Long term (current) use of anticoagulants: Secondary | ICD-10-CM | POA: Diagnosis not present

## 2023-02-13 LAB — POCT INR: INR: 2.7 (ref 2.0–3.0)

## 2023-02-13 NOTE — Progress Notes (Signed)
Continue 1 tablet daily except take 1 1/2 tablets on Fridays and recheck in 6 weeks.

## 2023-02-13 NOTE — Patient Instructions (Addendum)
Pre visit review using our clinic review tool, if applicable. No additional management support is needed unless otherwise documented below in the visit note.  Continue 1 tablet daily except take 1 1/2 tablets on Fridays and recheck in 6 weeks.

## 2023-02-19 ENCOUNTER — Other Ambulatory Visit: Payer: Self-pay | Admitting: Internal Medicine

## 2023-02-19 DIAGNOSIS — E785 Hyperlipidemia, unspecified: Secondary | ICD-10-CM

## 2023-02-19 MED ORDER — RYBELSUS 3 MG PO TABS: 3.0000 mg | ORAL_TABLET | Freq: Every day | ORAL | 2 refills | Status: DC

## 2023-02-27 ENCOUNTER — Other Ambulatory Visit (HOSPITAL_COMMUNITY): Payer: Self-pay | Admitting: Internal Medicine

## 2023-03-06 ENCOUNTER — Other Ambulatory Visit: Payer: Self-pay

## 2023-03-09 ENCOUNTER — Other Ambulatory Visit: Payer: Self-pay | Admitting: Nurse Practitioner

## 2023-03-09 ENCOUNTER — Other Ambulatory Visit: Payer: Self-pay

## 2023-03-09 DIAGNOSIS — C50112 Malignant neoplasm of central portion of left female breast: Secondary | ICD-10-CM

## 2023-03-09 MED ORDER — TAMOXIFEN CITRATE 20 MG PO TABS: 20.0000 mg | ORAL_TABLET | Freq: Every day | ORAL | 2 refills | Status: DC

## 2023-03-13 ENCOUNTER — Ambulatory Visit (HOSPITAL_COMMUNITY)
Admission: RE | Admit: 2023-03-13 | Discharge: 2023-03-13 | Disposition: A | Discharge status: Home / Self Care | Payer: Medicare HMO | Source: Ambulatory Visit | Attending: Cardiology | Admitting: Cardiology

## 2023-03-13 DIAGNOSIS — I519 Heart disease, unspecified: Secondary | ICD-10-CM | POA: Insufficient documentation

## 2023-03-13 DIAGNOSIS — Z9049 Acquired absence of other specified parts of digestive tract: Secondary | ICD-10-CM | POA: Diagnosis not present

## 2023-03-13 DIAGNOSIS — I700 Atherosclerosis of aorta: Secondary | ICD-10-CM | POA: Diagnosis not present

## 2023-03-13 DIAGNOSIS — I2510 Atherosclerotic heart disease of native coronary artery without angina pectoris: Secondary | ICD-10-CM | POA: Diagnosis not present

## 2023-03-13 LAB — POCT I-STAT CREATININE: Creatinine, Ser: 0.7 mg/dL (ref 0.44–1.00)

## 2023-03-13 MED ORDER — IOHEXOL 350 MG/ML SOLN
100.0000 mL | Freq: Once | INTRAVENOUS | Status: AC | PRN
  Administered 2023-03-13: 100 mL via INTRAVENOUS

## 2023-03-15 ENCOUNTER — Telehealth (HOSPITAL_COMMUNITY): Payer: Self-pay

## 2023-03-15 NOTE — Telephone Encounter (Addendum)
Pt aware, agreeable, and verbalized understanding   ----- Message from Marca Ancona sent at 03/15/2023  8:57 AM EDT ----- The aortic abnormality is scarring from prior cardiopulmonary bypass cannulation, no penetrating aortic ulceration.   This is ok.

## 2023-03-23 ENCOUNTER — Ambulatory Visit: Payer: Medicare HMO

## 2023-03-27 ENCOUNTER — Ambulatory Visit (INDEPENDENT_AMBULATORY_CARE_PROVIDER_SITE_OTHER): Payer: Medicare HMO

## 2023-03-27 DIAGNOSIS — Z7901 Long term (current) use of anticoagulants: Secondary | ICD-10-CM | POA: Diagnosis not present

## 2023-03-27 LAB — POCT INR: INR: 2.4 (ref 2.0–3.0)

## 2023-03-27 NOTE — Progress Notes (Signed)
Continue 1 tablet daily except take 1 1/2 tablets on Fridays and recheck in 5 weeks.

## 2023-03-27 NOTE — Patient Instructions (Addendum)
Pre visit review using our clinic review tool, if applicable. No additional management support is needed unless otherwise documented below in the visit note.  Continue 1 tablet daily except take 1 1/2 tablets on Fridays and recheck in 5 weeks.

## 2023-03-29 DIAGNOSIS — L299 Pruritus, unspecified: Secondary | ICD-10-CM | POA: Diagnosis not present

## 2023-04-01 ENCOUNTER — Encounter: Payer: Self-pay | Admitting: Internal Medicine

## 2023-04-01 NOTE — Patient Instructions (Addendum)
     Your sugars are well controlled - your A1c is 6.8%    Medications changes include :   none       Return in about 6 months (around 10/03/2023) for Physical Exam.

## 2023-04-01 NOTE — Progress Notes (Unsigned)
Subjective:    Patient ID: Anita Duke, female    DOB: 12/23/1940, 82 y.o.   MRN: [id number]     HPI Anita Duke is here for follow up of her chronic medical problems.  Walking dog 3/ day.   Has inter intermittent choking - evaluation in the past showed a sensitive gag reflex and can cause a spasm.    Has GERD 2-3 times a week.    Medications and allergies reviewed with patient and updated if appropriate.  Current Outpatient Medications on File Prior to Visit  Medication Sig Dispense Refill   amoxicillin (AMOXIL) 500 MG capsule TAKE 4 CAPSULES PRIOR TO DENTAL APPOINTMENTS AS DIRECTED 20 capsule 0   atorvastatin (LIPITOR) 40 MG tablet TAKE 1 TABLET DAILY 90 tablet 3   carvedilol (COREG) 12.5 MG tablet TAKE 1 TABLET TWICE DAILY  WITH MEALS 180 tablet 3   cetirizine (ZYRTEC) 10 MG tablet Take 10 mg by mouth daily.     cholecalciferol (VITAMIN D) 1000 UNITS tablet Take 1,000 Units by mouth daily.      cyanocobalamin (VITAMIN B12) 500 MCG tablet Take 500 mcg by mouth daily.     empagliflozin (JARDIANCE) 25 MG TABS tablet Take 1 tablet (25 mg total) by mouth daily. 30 tablet 3   ENTRESTO 24-26 MG TAKE 1 TABLET TWICE A DAY 60 tablet 6   fish oil-omega-3 fatty acids 1000 MG capsule Take 1 g by mouth 2 (two) times daily.     Polyethyl Glycol-Propyl Glycol (SYSTANE) 0.4-0.3 % SOLN Place 1 drop into both eyes daily.     Semaglutide (RYBELSUS) 3 MG TABS Take 1 tablet (3 mg total) by mouth daily. 30 tablet 2   sodium chloride (OCEAN) 0.65 % SOLN nasal spray Place 1 spray into both nostrils 2 (two) times daily.     spironolactone (ALDACTONE) 25 MG tablet TAKE 1 TABLET DAILY 90 tablet 3   tamoxifen (NOLVADEX) 20 MG tablet Take 1 tablet (20 mg total) by mouth daily. 90 tablet 2   venlafaxine XR (EFFEXOR-XR) 37.5 MG 24 hr capsule TAKE 1 CAPSULE DAILY 90 capsule 2   warfarin (COUMADIN) 2.5 MG tablet TAKE 1 TABLET BY MOUTH DAILY OR AS DIRECTED BY ANTICOAGULATION  CLINIC (Patient taking  differently: Take 2.5-3.75 mg by mouth every evening. 1 tablet everyday except on Friday patient takes 1.5 tablets to equal 3.75 mg) 104 tablet 3   No current facility-administered medications on file prior to visit.     Review of Systems  Constitutional:  Negative for fever.  Respiratory:  Negative for cough, shortness of breath and wheezing.   Cardiovascular:  Negative for chest pain, palpitations and leg swelling.  Neurological:  Negative for light-headedness and headaches.       Objective:   Vitals:   04/02/23 0922  BP: (!) 140/80  Pulse: 80  Temp: 98 F (36.7 C)  SpO2: 94%   BP Readings from Last 3 Encounters:  04/02/23 (!) 140/80  01/22/23 104/60  01/08/23 (!) 160/64   Wt Readings from Last 3 Encounters:  04/02/23 151 lb (68.5 kg)  01/22/23 145 lb (65.8 kg)  01/08/23 145 lb 8 oz (66 kg)   Body mass index is 29.49 kg/m.    Physical Exam Constitutional:      General: She is not in acute distress.    Appearance: Normal appearance.  HENT:     Head: Normocephalic and atraumatic.  Eyes:     Conjunctiva/sclera: Conjunctivae normal.  Cardiovascular:  Rate and Rhythm: Normal rate and regular rhythm.     Heart sounds: Normal heart sounds.  Pulmonary:     Effort: Pulmonary effort is normal. No respiratory distress.     Breath sounds: Normal breath sounds. No wheezing.  Musculoskeletal:     Cervical back: Neck supple.     Right lower leg: No edema.     Left lower leg: No edema.  Lymphadenopathy:     Cervical: No cervical adenopathy.  Skin:    General: Skin is warm and dry.     Findings: No rash.  Neurological:     Mental Status: She is alert. Mental status is at baseline.  Psychiatric:        Mood and Affect: Mood normal.        Behavior: Behavior normal.        Lab Results  Component Value Date   WBC 5.4 01/22/2023   HGB 14.8 01/22/2023   HCT 46.4 (H) 01/22/2023   PLT 170 01/22/2023   GLUCOSE 132 (H) 01/22/2023   CHOL 139 01/22/2023   TRIG  78 01/22/2023   HDL 60 01/22/2023   LDLDIRECT 65.0 01/05/2016   LDLCALC 63 01/22/2023   ALT 11 01/08/2023   AST 16 01/08/2023   NA 139 01/22/2023   K 3.8 01/22/2023   CL 104 01/22/2023   CREATININE 0.70 03/13/2023   BUN 17 01/22/2023   CO2 24 01/22/2023   TSH 0.99 10/02/2022   INR 2.4 03/27/2023   HGBA1C 6.8 (A) 04/02/2023   HGBA1C 6.8 04/02/2023   HGBA1C 6.8 (A) 04/02/2023   HGBA1C 6.8 04/02/2023   MICROALBUR <0.7 10/02/2022     Assessment & Plan:    See Problem List for Assessment and Plan of chronic medical problems.

## 2023-04-02 ENCOUNTER — Ambulatory Visit: Payer: Medicare HMO | Admitting: Internal Medicine

## 2023-04-02 VITALS — BP 140/80 | HR 80 | Temp 98.0°F | Ht 60.0 in | Wt 151.0 lb

## 2023-04-02 DIAGNOSIS — I10 Essential (primary) hypertension: Secondary | ICD-10-CM

## 2023-04-02 DIAGNOSIS — K219 Gastro-esophageal reflux disease without esophagitis: Secondary | ICD-10-CM

## 2023-04-02 DIAGNOSIS — I519 Heart disease, unspecified: Secondary | ICD-10-CM

## 2023-04-02 DIAGNOSIS — E785 Hyperlipidemia, unspecified: Secondary | ICD-10-CM

## 2023-04-02 DIAGNOSIS — F3289 Other specified depressive episodes: Secondary | ICD-10-CM

## 2023-04-02 DIAGNOSIS — E782 Mixed hyperlipidemia: Secondary | ICD-10-CM | POA: Diagnosis not present

## 2023-04-02 DIAGNOSIS — I2510 Atherosclerotic heart disease of native coronary artery without angina pectoris: Secondary | ICD-10-CM | POA: Diagnosis not present

## 2023-04-02 DIAGNOSIS — E1169 Type 2 diabetes mellitus with other specified complication: Secondary | ICD-10-CM | POA: Diagnosis not present

## 2023-04-02 DIAGNOSIS — I4891 Unspecified atrial fibrillation: Secondary | ICD-10-CM

## 2023-04-02 LAB — POCT GLYCOSYLATED HEMOGLOBIN (HGB A1C)
HbA1c POC (<> result, manual entry): 6.8 % (ref 4.0–5.6)
HbA1c, POC (controlled diabetic range): 6.8 % (ref 0.0–7.0)
HbA1c, POC (prediabetic range): 6.8 % — AB (ref 5.7–6.4)
Hemoglobin A1C: 6.8 % — AB (ref 4.0–5.6)

## 2023-04-02 NOTE — Assessment & Plan Note (Addendum)
Chronic Lab Results  Component Value Date   HGBA1C 7.6 (H) 10/02/2022   Not ideally controlled Continue jardiance 25 mg daily Start rybelsus 3 mg daily Check A1c today 6.8% - improved and controlled Continue regular walking, diabetic diet

## 2023-04-02 NOTE — Assessment & Plan Note (Signed)
New Having increased gerd - can be 2-3 /week Will try to stop snacking at night  Tums as needed If GERD does not improve may need to start daily  medication

## 2023-04-02 NOTE — Assessment & Plan Note (Signed)
Chronic Follows with cardiology Appears euvolemic Continue Delene Loll, Jardiance and spironolactone

## 2023-04-02 NOTE — Assessment & Plan Note (Signed)
Chronic Controlled, Stable Continue venlafaxine 37.5 mg daily 

## 2023-04-02 NOTE — Assessment & Plan Note (Signed)
Chronic Follow-up with cardiology No symptoms consistent with angina On warfarin, Coreg, Jardiance, spironolactone, Entresto, atorvastatin

## 2023-04-02 NOTE — Assessment & Plan Note (Signed)
Chronic Regular exercise and healthy diet encouraged Continue atorvastatin 40 mg daily 

## 2023-04-02 NOTE — Assessment & Plan Note (Signed)
Chronic Blood pressure well controlled Continue Coreg 12.5 mg twice daily, Entresto 24-26 mg twice daily, spironolactone 25 mg daily

## 2023-04-02 NOTE — Assessment & Plan Note (Signed)
Chronic Following with cardiology On warfarin which is monitored at our Coumadin clinic Rate controlled with carvedilol 12.5 mg twice daily

## 2023-04-03 ENCOUNTER — Encounter: Payer: Medicare HMO | Admitting: Cardiology

## 2023-04-04 ENCOUNTER — Encounter (INDEPENDENT_AMBULATORY_CARE_PROVIDER_SITE_OTHER): Payer: Self-pay

## 2023-04-09 ENCOUNTER — Ambulatory Visit (INDEPENDENT_AMBULATORY_CARE_PROVIDER_SITE_OTHER)
Admission: RE | Admit: 2023-04-09 | Discharge: 2023-04-09 | Disposition: A | Discharge status: Home / Self Care | Payer: Medicare HMO | Source: Ambulatory Visit | Attending: Internal Medicine | Admitting: Internal Medicine

## 2023-04-09 DIAGNOSIS — M85852 Other specified disorders of bone density and structure, left thigh: Secondary | ICD-10-CM | POA: Diagnosis not present

## 2023-04-19 ENCOUNTER — Ambulatory Visit: Payer: Medicare HMO | Attending: Cardiology | Admitting: Cardiology

## 2023-04-19 ENCOUNTER — Encounter: Payer: Self-pay | Admitting: Cardiology

## 2023-04-19 VITALS — BP 128/80 | HR 78 | Ht 60.0 in | Wt 151.4 lb

## 2023-04-19 DIAGNOSIS — D6869 Other thrombophilia: Secondary | ICD-10-CM | POA: Diagnosis not present

## 2023-04-19 DIAGNOSIS — I2510 Atherosclerotic heart disease of native coronary artery without angina pectoris: Secondary | ICD-10-CM

## 2023-04-19 DIAGNOSIS — I5022 Chronic systolic (congestive) heart failure: Secondary | ICD-10-CM | POA: Diagnosis not present

## 2023-04-19 DIAGNOSIS — I4821 Permanent atrial fibrillation: Secondary | ICD-10-CM

## 2023-04-19 NOTE — Progress Notes (Signed)
  Electrophysiology Office Note:   Date:  04/19/2023  ID:  Anita Duke, DOB May 09, 1941, MRN [id number]  Primary Cardiologist: Marca Ancona, MD Electrophysiologist:  Jorja Loa, MD      History of Present Illness:   Anita Duke is a 82 y.o. female with h/o atrial fibrillation, chronic systolic heart failure, coronary artery disease seen today for routine electrophysiology followup.  Since last being seen in our clinic the patient reports doing well.  She was hospitalized last spring with pneumonia.  Since then, she has done well.  She has had no further issues.  She is able to do all of her daily activities without restriction.  she denies chest pain, palpitations, dyspnea, PND, orthopnea, nausea, vomiting, dizziness, syncope, edema, weight gain, or early satiety.   Review of systems complete and found to be negative unless listed in HPI.      EP Information / Studies Reviewed:    EKG is not ordered today. EKG from 01/22/23 reviewed which showed atrial fibrillation, ventricular paced      PPM Interrogation-  reviewed in detail today,  See PACEART report.  Device History: Abbott BiV PPM implanted 05/17/12 with CS lead 11/04/13 for  atrial fibrillation post AVN ablation  Risk Assessment/Calculations:    CHA2DS2-VASc Score = 7   This indicates a 11.2% annual risk of stroke. The patient's score is based upon: CHF History: 1 HTN History: 1 Diabetes History: 1 Stroke History: 0 Vascular Disease History: 1 Age Score: 2 Gender Score: 1             Physical Exam:   VS:  BP 128/80   Pulse 78   Ht 5' (1.524 m)   Wt 151 lb 6.4 oz (68.7 kg)   SpO2 95%   BMI 29.57 kg/m    Wt Readings from Last 3 Encounters:  04/19/23 151 lb 6.4 oz (68.7 kg)  04/02/23 151 lb (68.5 kg)  01/22/23 145 lb (65.8 kg)     GEN: Well nourished, well developed in no acute distress NECK: No JVD; No carotid bruits CARDIAC: Regular rate and rhythm, no murmurs, rubs,  gallops RESPIRATORY:  Clear to auscultation without rales, wheezing or rhonchi  ABDOMEN: Soft, non-tender, non-distended EXTREMITIES:  No edema; No deformity   ASSESSMENT AND PLAN:    1.  Permanent atrial fibrillation s/p Abbott PPM  Normal PPM function See Pace Art report No changes today On warfarin.  2.  Coronary artery disease: No current angina.  3.  Chronic systolic heart failure: Currently on medical therapy per primary cardiology.  No volume overload.  4.  Mitral valve repair: Stable on most recent echo  5.  Second hypercoagulable state: Currently on warfarin for atrial fibrillation  Disposition:   Follow up with EP APP in 12 months  Signed,  Jorja Loa, MD

## 2023-04-19 NOTE — Patient Instructions (Addendum)
Medication Instructions:  Your physician recommends that you continue on your current medications as directed. Please refer to the Current Medication list given to you today.  *If you need a refill on your cardiac medications before your next appointment, please call your pharmacy*  Lab Work: None ordered today.  Testing/Procedures: None ordered today.  Follow-Up: At [facility], you and your health needs are our priority.  As part of our continuing mission to provide you with exceptional heart care, we have created designated Provider Care Teams.  These Care Teams include your primary Cardiologist (physician) and Advanced Practice Providers (APPs -  Physician Assistants and Nurse Practitioners) who all work together to provide you with the care you need, when you need it.  Your next appointment:   12 month(s)  The format for your next appointment:   In Person  Provider:   You will see one of the following Advanced Practice Providers on your designated Care Team:   Francis Dowse, Charlott Holler [address]" [place], [place] Sherie Don, NP Canary Brim, NP

## 2023-04-20 ENCOUNTER — Ambulatory Visit: Payer: Medicare HMO | Admitting: Cardiology

## 2023-04-27 ENCOUNTER — Ambulatory Visit (INDEPENDENT_AMBULATORY_CARE_PROVIDER_SITE_OTHER): Payer: Medicare HMO

## 2023-04-27 DIAGNOSIS — Z7901 Long term (current) use of anticoagulants: Secondary | ICD-10-CM

## 2023-04-27 LAB — POCT INR: INR: 1.6 — AB (ref 2.0–3.0)

## 2023-04-27 NOTE — Patient Instructions (Addendum)
Pre visit review using our clinic review tool, if applicable. No additional management support is needed unless otherwise documented below in the visit note.  Increase dose today to take 2 tablets and increase dose tomorrow to take 1 1/2 tablets and the continue 1 tablet daily except take 1 1/2 tablets on Fridays and recheck in 3 weeks.

## 2023-04-27 NOTE — Progress Notes (Signed)
Pt reports eating more greens this week. Increase dose today to take 2 tablets and increase dose tomorrow to take 1 1/2 tablets and the continue 1 tablet daily except take 1 1/2 tablets on Fridays and recheck in 3 weeks.

## 2023-05-01 ENCOUNTER — Ambulatory Visit: Payer: Medicare HMO

## 2023-05-15 ENCOUNTER — Encounter: Payer: Self-pay | Admitting: Internal Medicine

## 2023-05-15 NOTE — Progress Notes (Unsigned)
    Subjective:    Patient ID: Anita Duke, female    DOB: 01/17/1941, 82 y.o.   MRN: [id number]      HPI [name] is here for No chief complaint on file.    Neck pain -     Medications and allergies reviewed with patient and updated if appropriate.  Current Outpatient Medications on File Prior to Visit  Medication Sig Dispense Refill   amoxicillin (AMOXIL) 500 MG capsule TAKE 4 CAPSULES PRIOR TO DENTAL APPOINTMENTS AS DIRECTED 20 capsule 0   atorvastatin (LIPITOR) 40 MG tablet TAKE 1 TABLET DAILY 90 tablet 3   carvedilol (COREG) 12.5 MG tablet TAKE 1 TABLET TWICE DAILY  WITH MEALS 180 tablet 3   cetirizine (ZYRTEC) 10 MG tablet Take 10 mg by mouth daily.     cholecalciferol (VITAMIN D) 1000 UNITS tablet Take 1,000 Units by mouth daily.      cyanocobalamin (VITAMIN B12) 500 MCG tablet Take 500 mcg by mouth daily.     empagliflozin (JARDIANCE) 25 MG TABS tablet Take 1 tablet (25 mg total) by mouth daily. 30 tablet 3   ENTRESTO 24-26 MG TAKE 1 TABLET TWICE A DAY 60 tablet 6   fish oil-omega-3 fatty acids 1000 MG capsule Take 1 g by mouth 2 (two) times daily.     Polyethyl Glycol-Propyl Glycol (SYSTANE) 0.4-0.3 % SOLN Place 1 drop into both eyes daily.     Semaglutide (RYBELSUS) 3 MG TABS Take 1 tablet (3 mg total) by mouth daily. 30 tablet 2   sodium chloride (OCEAN) 0.65 % SOLN nasal spray Place 1 spray into both nostrils 2 (two) times daily.     spironolactone (ALDACTONE) 25 MG tablet TAKE 1 TABLET DAILY 90 tablet 3   tamoxifen (NOLVADEX) 20 MG tablet Take 1 tablet (20 mg total) by mouth daily. 90 tablet 2   venlafaxine XR (EFFEXOR-XR) 37.5 MG 24 hr capsule TAKE 1 CAPSULE DAILY 90 capsule 2   warfarin (COUMADIN) 2.5 MG tablet TAKE 1 TABLET BY MOUTH DAILY OR AS DIRECTED BY ANTICOAGULATION  CLINIC 104 tablet 3   No current facility-administered medications on file prior to visit.    Review of Systems     Objective:  There were no vitals filed for this visit. BP  Readings from Last 3 Encounters:  04/19/23 128/80  04/02/23 (!) 140/80  01/22/23 104/60   Wt Readings from Last 3 Encounters:  04/19/23 151 lb 6.4 oz (68.7 kg)  04/02/23 151 lb (68.5 kg)  01/22/23 145 lb (65.8 kg)   There is no height or weight on file to calculate BMI.    Physical Exam         Assessment & Plan:    See Problem List for Assessment and Plan of chronic medical problems.

## 2023-05-16 ENCOUNTER — Other Ambulatory Visit (HOSPITAL_COMMUNITY): Payer: Self-pay

## 2023-05-16 ENCOUNTER — Ambulatory Visit (INDEPENDENT_AMBULATORY_CARE_PROVIDER_SITE_OTHER): Payer: Medicare HMO | Admitting: Internal Medicine

## 2023-05-16 ENCOUNTER — Encounter: Payer: Self-pay | Admitting: Internal Medicine

## 2023-05-16 VITALS — BP 146/74 | HR 76 | Temp 98.2°F | Ht 60.0 in | Wt 148.8 lb

## 2023-05-16 DIAGNOSIS — Z23 Encounter for immunization: Secondary | ICD-10-CM

## 2023-05-16 DIAGNOSIS — M542 Cervicalgia: Secondary | ICD-10-CM

## 2023-05-16 MED ORDER — BACLOFEN 5 MG PO TABS
10.0000 mg | ORAL_TABLET | Freq: Every evening | ORAL | 0 refills | Status: DC | PRN
  Filled 2023-05-16: qty 60, 20d supply, fill #0

## 2023-05-16 NOTE — Assessment & Plan Note (Signed)
Acute Started 6 days ago Likely related to sleeping the wrong way and possibly prolonged riding a golf cart Muscular in nature Advised heat, ice, topical medications Encouraged her to do gentle stretching and massage in the area Will avoid tizanidine secondary to interaction with other medications and risk of QT prolongation Trial of baclofen 10-15 mg at bedtime-can adjust dose She will let me know if there is no improvement Can consider PT

## 2023-05-16 NOTE — Patient Instructions (Addendum)
        Medications changes include :   baclofen 5-15 mg nightly    Continue heat, topical medication, stretching     Return if symptoms worsen or fail to improve.

## 2023-05-18 ENCOUNTER — Ambulatory Visit: Payer: Medicare HMO

## 2023-05-21 ENCOUNTER — Other Ambulatory Visit: Payer: Self-pay | Admitting: Internal Medicine

## 2023-05-21 DIAGNOSIS — Z7901 Long term (current) use of anticoagulants: Secondary | ICD-10-CM

## 2023-05-21 NOTE — Telephone Encounter (Signed)
Pt is compliant with warfarin management and PCP apts.  Sent in refill of warfarin to requested pharmacy.

## 2023-05-25 ENCOUNTER — Ambulatory Visit (HOSPITAL_COMMUNITY)
Admission: RE | Admit: 2023-05-25 | Discharge: 2023-05-25 | Disposition: A | Discharge status: Home / Self Care | Payer: Medicare HMO | Source: Ambulatory Visit | Attending: Family Medicine | Admitting: Family Medicine

## 2023-05-25 ENCOUNTER — Ambulatory Visit: Payer: Self-pay

## 2023-05-25 ENCOUNTER — Telehealth (HOSPITAL_COMMUNITY): Payer: Self-pay

## 2023-05-25 ENCOUNTER — Other Ambulatory Visit (HOSPITAL_COMMUNITY): Payer: Self-pay

## 2023-05-25 ENCOUNTER — Ambulatory Visit: Payer: Medicare HMO

## 2023-05-25 ENCOUNTER — Encounter (HOSPITAL_COMMUNITY): Payer: Self-pay

## 2023-05-25 VITALS — BP 158/84 | HR 70 | Wt 150.0 lb

## 2023-05-25 DIAGNOSIS — E119 Type 2 diabetes mellitus without complications: Secondary | ICD-10-CM | POA: Diagnosis not present

## 2023-05-25 DIAGNOSIS — Z9889 Other specified postprocedural states: Secondary | ICD-10-CM

## 2023-05-25 DIAGNOSIS — I428 Other cardiomyopathies: Secondary | ICD-10-CM | POA: Insufficient documentation

## 2023-05-25 DIAGNOSIS — Z952 Presence of prosthetic heart valve: Secondary | ICD-10-CM | POA: Diagnosis not present

## 2023-05-25 DIAGNOSIS — C50112 Malignant neoplasm of central portion of left female breast: Secondary | ICD-10-CM | POA: Diagnosis not present

## 2023-05-25 DIAGNOSIS — I442 Atrioventricular block, complete: Secondary | ICD-10-CM | POA: Diagnosis not present

## 2023-05-25 DIAGNOSIS — I4821 Permanent atrial fibrillation: Secondary | ICD-10-CM | POA: Diagnosis not present

## 2023-05-25 DIAGNOSIS — I2510 Atherosclerotic heart disease of native coronary artery without angina pectoris: Secondary | ICD-10-CM | POA: Diagnosis not present

## 2023-05-25 DIAGNOSIS — Z7984 Long term (current) use of oral hypoglycemic drugs: Secondary | ICD-10-CM | POA: Insufficient documentation

## 2023-05-25 DIAGNOSIS — I5022 Chronic systolic (congestive) heart failure: Secondary | ICD-10-CM

## 2023-05-25 DIAGNOSIS — I495 Sick sinus syndrome: Secondary | ICD-10-CM

## 2023-05-25 DIAGNOSIS — I272 Pulmonary hypertension, unspecified: Secondary | ICD-10-CM | POA: Diagnosis not present

## 2023-05-25 DIAGNOSIS — I7121 Aneurysm of the ascending aorta, without rupture: Secondary | ICD-10-CM | POA: Diagnosis not present

## 2023-05-25 DIAGNOSIS — Z7901 Long term (current) use of anticoagulants: Secondary | ICD-10-CM | POA: Insufficient documentation

## 2023-05-25 DIAGNOSIS — Z950 Presence of cardiac pacemaker: Secondary | ICD-10-CM | POA: Insufficient documentation

## 2023-05-25 DIAGNOSIS — R9431 Abnormal electrocardiogram [ECG] [EKG]: Secondary | ICD-10-CM | POA: Insufficient documentation

## 2023-05-25 DIAGNOSIS — E876 Hypokalemia: Secondary | ICD-10-CM

## 2023-05-25 LAB — IRON AND TIBC
Iron: 54 ug/dL (ref 28–170)
Saturation Ratios: 16 % (ref 10.4–31.8)
TIBC: 337 ug/dL (ref 250–450)
UIBC: 283 ug/dL

## 2023-05-25 LAB — BASIC METABOLIC PANEL
Anion gap: 6 (ref 5–15)
BUN: 13 mg/dL (ref 8–23)
CO2: 28 mmol/L (ref 22–32)
Calcium: 8.2 mg/dL — ABNORMAL LOW (ref 8.9–10.3)
Chloride: 106 mmol/L (ref 98–111)
Creatinine, Ser: 0.6 mg/dL (ref 0.44–1.00)
GFR, Estimated: >60 mL/min (ref >60)
Glucose, Bld: 163 mg/dL — ABNORMAL HIGH (ref 70–99)
Potassium: 3 mmol/L — ABNORMAL LOW (ref 3.5–5.1)
Sodium: 140 mmol/L (ref 135–145)

## 2023-05-25 LAB — PROTIME-INR
INR: 2.2 — ABNORMAL HIGH (ref 0.8–1.2)
Prothrombin Time: 24.8 seconds — ABNORMAL HIGH (ref 11.4–15.2)

## 2023-05-25 LAB — BRAIN NATRIURETIC PEPTIDE: B Natriuretic Peptide: 134 pg/mL — ABNORMAL HIGH (ref 0.0–100.0)

## 2023-05-25 LAB — FERRITIN: Ferritin: 60 ng/mL (ref 11–307)

## 2023-05-25 MED ORDER — FUROSEMIDE 20 MG PO TABS
20.0000 mg | ORAL_TABLET | Freq: Every day | ORAL | 11 refills | Status: DC | PRN
  Filled 2023-05-25: qty 30, 30d supply, fill #0

## 2023-05-25 MED ORDER — POTASSIUM CHLORIDE CRYS ER 20 MEQ PO TBCR
40.0000 meq | EXTENDED_RELEASE_TABLET | Freq: Every day | ORAL | 1 refills | Status: DC
  Filled 2023-05-25: qty 60, 30d supply, fill #0

## 2023-05-25 NOTE — Progress Notes (Addendum)
Pt had apt with cardiology today and they had PT/INR lab drawn for pt.  Continue 1 tablet daily except take 1 1/2 tablets on Fridays and recheck in 6 weeks.  LVM with dosing instructions and advised to return call to schedule next coumadin clinic apt.   Pt returned call and scheduled next apt. Pt verbalized understanding.

## 2023-05-25 NOTE — Patient Instructions (Addendum)
Pre visit review using our clinic review tool, if applicable. No additional management support is needed unless otherwise documented below in the visit note.  Continue 1 tablet daily except take 1 1/2 tablets on Fridays and recheck in 6 weeks.

## 2023-05-25 NOTE — Telephone Encounter (Signed)
Spoke with patient regarding the following results. Patient made aware and patient verbalized understanding.   Potassium daily ordered, sent to pharmacy and patient will start today.   Bmet ordered and scheduled for next week.   Advised patient to call back to office with any issues, questions, or concerns. Patient verbalized understanding.

## 2023-05-25 NOTE — Telephone Encounter (Signed)
-----   Message from Jacklynn Ganong sent at 05/25/2023 11:40 AM EDT ----- K is very low. Please start 40 KCL daily. Repeat BMET in 1 week.  I will forward her INR to her PCP who  manages her Coumadin

## 2023-05-25 NOTE — Patient Instructions (Signed)
EKG done today.  Labs done today. We will contact you only if your labs are abnormal.  START Lasix 20mg  (1 tablet) by mouth today, Sunday and Tuesday. THEN DECREASE to 20mg  (1 tablet) by mouth daily as needed for swelling or a weight gain of 3 pounds or more in 24 hours or 5 pounds in one week.   No other medication changes were made. Please continue all current medications as prescribed.  Your physician recommends that you schedule a follow-up appointment in: 4 months with Dr. Shirlee Latch. Please contact our office in November to schedule a January 2025 appointment.   If you have any questions or concerns before your next appointment please send [name] a message through [place] or call our office at [phone].    TO LEAVE A MESSAGE FOR THE NURSE SELECT OPTION 2, PLEASE LEAVE A MESSAGE INCLUDING: YOUR NAME DATE OF BIRTH CALL BACK NUMBER REASON FOR CALL**this is important as we prioritize the call backs  YOU WILL RECEIVE A CALL BACK THE SAME DAY AS LONG AS YOU CALL BEFORE 4:00 PM   Do the following things EVERYDAY: Weigh yourself in the morning before breakfast. Write it down and keep it in a log. Take your medicines as prescribed Eat low salt foods--Limit salt (sodium) to 2000 mg per day.  Stay as active as you can everyday Limit all fluids for the day to less than 2 liters   At the [facility], you and your health needs are our priority. As part of our continuing mission to provide you with exceptional heart care, we have created designated Provider Care Teams. These Care Teams include your primary Cardiologist (physician) and Advanced Practice Providers (APPs- Physician Assistants and Nurse Practitioners) who all work together to provide you with the care you need, when you need it.   You may see any of the following providers on your designated Care Team at your next follow up: Dr Arvilla Meres Dr Marca Ancona Dr. Marcos Eke, NP Robbie Lis, [place] [facility] [place] [facility], [place] Brynda Peon, NP Karle Plumber, PharmD   Please be sure to bring in all your medications bottles to every appointment.    Thank you for choosing [facility]

## 2023-05-25 NOTE — Progress Notes (Signed)
[facility] Note    Primary Care: Dr. Cheryll Cockayne HF Cardiologist: Dr. Shirlee Latch EP: Dr. Johney Frame   HPI: Anita Duke is a 82 y.o. with a past medical history of pulmonary HTN, mitral regurgitation s/p mitral valve repair, MAZE procedure at the [facility] in 2001, at that time she also had a LIMA to LAD graft placed. Also with history of permanent Afib on warfarin s/p AVN ablation. At one time she was on Tikosyn which was stopped for QT prolongation.  She has chronic systolic CHF (EF 81% in 12/2014).  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 Last cath in 2014 with atretic LIMA to LAD but nonobstructive disease in LAD.   Echo in 10/18 showed EF improved to 55-60% with mild to moderate RV dysfunction.   Echo in 7/20 showed EF 50% with basal inferior and inferolateral akinesis, mildly decreased RV systolic function, s/p MV repair with trivial MR, no MS, normal IVC.  Echo in 1/22 showed EF 45-50% with basal inferior and basal inferolateral akinesis, mildly decreased RV systolic function, s/p MV repair with trivial MR and mean gradient 4 mmHg.   Patient developed breast cancer in 2022 and is status post lumpectomy.  Echo 12/22 showed EF 45-50%, diffuse hypokinesis, mildly decreased RV function, repaired mitral valve with trivial MR no MS, normal IVC.   Patient was admitted in 3/24 with metapneumovirus PNA.  HS-troponin was found to be elevated to 3029 and she had some chest pain, suspected NSTEMI.  Coronary CTA was done, showing totally occluded LCx with collaterals, atretic LIMA but patent LAD.  There was a possible small ulceration in the ascending aorta.  Echo in 3/24 showed EF 50-55%, akinesis of the basal -mid inferior and inferolateral walls, moderate RV dysfunction, s/p MV repair with minimal MR.   Today she returns for HF follow up. Overall feeling fine. Main complaint is MSK neck pain, has muscle relaxer. Just got back from beach. She lives at [employer] with her dog, Muffy. She is not SOB with activity, feels fatigued occasionally. Denies palpitations, abnormal bleeding, CP, dizziness, edema, or PND/Orthopnea. Appetite ok. No fever or chills. She does not weigh at home. Taking all medications.   ECG (personally reviewed): atrial fibrillation, BiV paced 70 bpm  St Jude device interrogation: 97% BIV pacing, decreased thoracic impedance suggesting volume up.   Labs (5/18): K 4.7, creatinine 0.62, LDL 71, HDL 61 Labs (10/18): K 3.5, creatinine 0.59, TSH normal Labs (7/20): K 3.9, creatinine 0.69, hgb 15.1, LDL 37, TSH normal Labs (7/21): K 3.8, creatinine 0.64, LDL 52, HDL 64 Labs (1/22): K 4.1, creatinine 0.68, LDL 40 Labs (7/22): LDL 47 Labs (10/22): K 3.6, creatinine 0.71 Labs (1/23): LDL 60, HDL 59 Labs (4/23): K 4.0, creatinine 0.61, hgb 13.2 Labs (11/23): K 4.3, creatinine 0.64 Labs (3/24): K 4.3, creatinine 0.72 Labs (5/24): K 3.8, creatinine 0.61, LDL 63 Labs (7/24): creatinine 0.70  ROS: All systems reviewed and negative except as per HPI.  PMH: 1. Type II diabetes. 2. COPD 3. Depression 4. H/o colon cancer 5. CAD: LIMA-LAD in 4/01 with MV repair.   - LHC (8/14): LIMA-LAD atretic, 50-60% mid LAD stenosis.  - Coronary CTA (3/24): totally occluded LCx with collaterals, atretic LIMA but patent LAD with 50-69% proximal stenosis.  6. Mitral regurgitation: S/p MV repair at the [facility] in 4/01.  She also had LAA ligation and MAZE. Echo in 7/20 with trivial MR and no MS.  7.  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   - St Jude CRT-P device.  - Echo (4/16): EF 40%, mild LV dilation, normal RV size with mild to moderately decreased systolic function, s/p mitral valve repair with mild MR, no significant stenosis.  - Echo (10/18): EF 55-60%, severe LAE, RV with mild to moderate systolic dysfunction, mild-moderate TR, PASP 35 mmHg.  - Echo (7/20): EF 50% with basal inferior and inferolateral akinesis,  mildly decreased RV systolic function, s/p MV repair with trivial MR, no MS, normal IVC.  - Echo (1/22): EF 45-50% with basal inferior and basal inferolateral akinesis, mildly decreased RV systolic function, s/p MV repair with trivial MR and mean gradient 4 mmHg. - Echo (12/22): EF 45-50%, diffuse hypokinesis, mildly decreased RV function, repaired mitral valve with trivial MR no MS, normal IVC.  - Echo (3/24): EF 50-55%, akinesis of the basal -mid inferior and inferolateral walls, moderate RV dysfunction, s/p MV repair with minimal MR.  8. Permanent atrial fibrillation: S/p MAZE in 4/01.  S/p AV nodal ablation in 9/13.   9. PFTs (1/19): No significant obstruction or restriction in the lungs.  10. Breast cancer: S/p lumpectomy in 2022.   ROS: All systems reviewed and negative except as per HPI.   Current Outpatient Medications  Medication Sig Dispense Refill   atorvastatin (LIPITOR) 40 MG tablet TAKE 1 TABLET DAILY 90 tablet 3   Baclofen 5 MG TABS Take 2-3 tablets (10-15 mg total) by mouth at bedtime as needed. 60 tablet 0   carvedilol (COREG) 12.5 MG tablet TAKE 1 TABLET TWICE DAILY  WITH MEALS 180 tablet 3   cetirizine (ZYRTEC) 10 MG tablet Take 10 mg by mouth daily.     cholecalciferol (VITAMIN D) 1000 UNITS tablet Take 1,000 Units by mouth daily.      cyanocobalamin (VITAMIN B12) 500 MCG tablet Take 500 mcg by mouth daily.     empagliflozin (JARDIANCE) 25 MG TABS tablet Take 1 tablet (25 mg total) by mouth daily. 30 tablet 3   ENTRESTO 24-26 MG TAKE 1 TABLET TWICE A DAY 60 tablet 6   fish oil-omega-3 fatty acids 1000 MG capsule Take 1 g by mouth 2 (two) times daily.     Polyethyl Glycol-Propyl Glycol (SYSTANE) 0.4-0.3 % SOLN Place 1 drop into both eyes daily.     Semaglutide (RYBELSUS) 3 MG TABS Take 1 tablet (3 mg total) by mouth daily. 30 tablet 2   sodium chloride (OCEAN) 0.65 % SOLN nasal spray Place 1 spray into both nostrils 2 (two) times daily.     spironolactone (ALDACTONE) 25 MG  tablet TAKE 1 TABLET DAILY 90 tablet 3   tamoxifen (NOLVADEX) 20 MG tablet Take 1 tablet (20 mg total) by mouth daily. 90 tablet 2   venlafaxine XR (EFFEXOR-XR) 37.5 MG 24 hr capsule TAKE 1 CAPSULE DAILY 90 capsule 2   warfarin (JANTOVEN) 2.5 MG tablet TAKE 1 TABLET DAILY EXCEPT TAKE 1 1/2 TABLETS ON FRIDAYS OR AS  DIRECTED BY ANTICOAGULATION CLINIC 105 tablet 3   amoxicillin (AMOXIL) 500 MG capsule TAKE 4 CAPSULES PRIOR TO DENTAL APPOINTMENTS AS DIRECTED 20 capsule 0   No current facility-administered medications for this encounter.   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    Employer: Engineer, maintenance (IT): [employer]   Tobacco Use   Smoking status: Never   Smokeless tobacco: Never  Vaping Use   Vaping status: Never Used  Substance and Sexual Activity   Alcohol use: No   Drug use: No   Sexual activity: Not Currently    Birth control/protection: Surgical  Other Topics Concern   Not on file  Social History Narrative   No caffeine   Right handed    Lives alone    Social Determinants of Health   Financial Resource Strain: Low Risk  (06/26/2022)   Overall Financial Resource Strain (CARDIA)    Difficulty of Paying Living Expenses: Not hard at all  Food Insecurity: No Food Insecurity (11/30/2022)   Hunger Vital Sign    Worried About Running Out of Food in the Last Year: Never true    Ran Out of Food in the Last Year: Never true  Transportation  Needs: No Transportation Needs (11/30/2022)   PRAPARE - Administrator, Civil Service (Medical): No    Lack of Transportation (Non-Medical): No  Physical Activity: Sufficiently Active (06/26/2022)   Exercise Vital Sign    Days of Exercise per Week: 7 days    Minutes of Exercise per Session: 30 min  Stress: No Stress Concern Present (06/26/2022)   [employer] of Occupational Health - Occupational Stress Questionnaire    Feeling of Stress : Not at all  Social Connections: Socially Integrated (06/26/2022)   Social Connection and Isolation Panel [NHANES]    Frequency of Communication with Friends and Family: More than three times a week    Frequency of Social Gatherings with Friends and Family: More than three times a week    Attends Religious Services: More than 4 times per year    Active Member of [employer] or Organizations: Yes    Attends Engineer, structural: More than 4 times per year    Marital Status: Married  Catering manager Violence: Not At Risk (11/25/2022)   Humiliation, Afraid, Rape, and Kick questionnaire    Fear of Current or Ex-Partner: No    Emotionally Abused: No    Physically Abused: No    Sexually Abused: No   Family History  Problem Relation Age of Onset   Diabetes Mother    Kidney disease Mother    Thyroid disease Mother    Alzheimer's disease Mother        Symptom onset in late 57s; confirmed via autopsy   Depression Mother    Heart disease Father    Diabetes Father    Heart disease Sister        x 2   Colon cancer Maternal Aunt        dx 62s; mother's paternal half sister   Colon cancer Maternal Aunt        dx late 25s   Heart disease Paternal Uncle        x 6   Cancer Maternal Grandfather        unknown type; dx unknown age   Diabetes Paternal Grandmother    Diabetes Paternal Grandfather    Colon cancer Cousin  Brain cancer Nephew 52   Colon polyps Neg Hx    Adrenal disorder Neg Hx    Esophageal cancer Neg Hx    Rectal  cancer Neg Hx    Stomach cancer Neg Hx    BP (!) 158/84   Pulse 70   Wt 68 kg (150 lb)   SpO2 97%   BMI 29.29 kg/m   Wt Readings from Last 3 Encounters:  05/25/23 68 kg (150 lb)  05/16/23 67.5 kg (148 lb 12.8 oz)  04/19/23 68.7 kg (151 lb 6.4 oz)   PHYSICAL EXAM: General:  NAD. No resp difficulty, walked into clinic HEENT: Normal Neck: Supple. JVP 8-10. Carotids 2+ bilat; no bruits. No lymphadenopathy or thryomegaly appreciated. Cor: PMI nondisplaced. Regular rate & rhythm. No rubs, gallops or murmurs. Lungs: Clear Abdomen: Soft, nontender, nondistended. No hepatosplenomegaly. No bruits or masses. Good bowel sounds. Extremities: No cyanosis, clubbing, rash, edema Neuro: Alert & oriented x 3, cranial nerves grossly intact. Moves all 4 extremities w/o difficulty. Affect pleasant.  ASSESSMENT & PLAN: 1. Chronic systolic CHF: EF 16% in 2016. NICM most likely.  She has LIMA-LAD that is atretic, but no flow limiting disease in the LAD.  Cardiomyopathy may be due to RV pacing after AV nodal ablation in 9/13. She now has St Jude CRT-P device and EF was up to 55-60% on 10/18 echo. Echo in 7/20 showed EF about 50%.  Echo in 1/22 and in 12/22 showed stable EF 45-50%.  Echo in 3/24 showed EF 50-55%, akinesis of the basal -mid inferior and inferolateral walls, moderate RV dysfunction, s/p MV repair with minimal MR.  NYHA class II symptoms.  She is not volume overloaded on exam, however CorVue suggesting volume up. - Start Lasix 20 mg every other day x 1 week, then use PRN. BMET and BNP today. - Continue Coreg 12.5 mg bid.  - Continue Jardiance.  - Continue Entresto 24/26 bid, will not increase today as she has not had her morning medications yet. BMET/BNP today.  - Continue spironolactone 25 mg daily.   - With fatigue, check iron panel 2. CAD: LIMA-LAD at time of MV surgery in 4/01, but LIMA-LAD atretic on 8/14 cath.  However, at that time her LAD had moderate stenosis that did not appear  flow-limiting (50-60%).  Coronary CTA in 3/24 showed totally occluded LCx with collaterals, atretic LIMA but patent LAD with 50-69% proximal stenosis.  NSTEMI in 3/24 but coronary CTA showed no culprit lesion.  The occluded LCx was old as evidenced by collaterals and also prior echoes have shown LCx territory wall motion abnormalities.  No chest pain.  - Continue atorvastatin 40 mg daily. Good lipids 5/24. - No ASA given use of warfarin 3. Complete heart block: She had an AV nodal ablation.  Now s/p CRT-P.   4. Permanent atrial fibrillation: Now s/p AV nodal ablation and BiV pacing. - Continue warfarin for anticoagulation. Check INR today and forward to PCP who manages her warfarin. - She could swich anticoagulation to Eliquis, data suggests ok to use DOAC with valve repair, bioprosthetic valve.  However, she has wanted to continue warfarin as she has been on it stably x years.  5. DM: Continue Jardiance.  6. S/p MV repair: Stable mitral valve repair on 3/24 echo.   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  - CTA chest showed aortic abnormality is scarring from prior cardiopulmonary bypass cannulation, no penetrating aortic ulceration.  Follow up in  4 months with Dr. Park Liter, FNP 05/25/23

## 2023-05-25 NOTE — Addendum Note (Signed)
Encounter addended by: Orlene Och, [place] on: 05/25/2023 10:23 AM  Actions taken: Visit diagnoses modified

## 2023-05-26 ENCOUNTER — Other Ambulatory Visit (HOSPITAL_COMMUNITY): Payer: Self-pay

## 2023-05-29 ENCOUNTER — Other Ambulatory Visit: Payer: Self-pay | Admitting: Internal Medicine

## 2023-05-29 DIAGNOSIS — E1169 Type 2 diabetes mellitus with other specified complication: Secondary | ICD-10-CM

## 2023-05-31 ENCOUNTER — Ambulatory Visit (HOSPITAL_COMMUNITY)
Admission: RE | Admit: 2023-05-31 | Discharge: 2023-05-31 | Disposition: A | Discharge status: Home / Self Care | Payer: Medicare HMO | Source: Ambulatory Visit | Attending: Family Medicine | Admitting: Family Medicine

## 2023-05-31 DIAGNOSIS — E876 Hypokalemia: Secondary | ICD-10-CM | POA: Diagnosis not present

## 2023-05-31 LAB — BASIC METABOLIC PANEL
Anion gap: 9 (ref 5–15)
BUN: 17 mg/dL (ref 8–23)
CO2: 24 mmol/L (ref 22–32)
Calcium: 8.7 mg/dL — ABNORMAL LOW (ref 8.9–10.3)
Chloride: 103 mmol/L (ref 98–111)
Creatinine, Ser: 0.84 mg/dL (ref 0.44–1.00)
GFR, Estimated: >60 mL/min (ref >60)
Glucose, Bld: 258 mg/dL — ABNORMAL HIGH (ref 70–99)
Potassium: 4.1 mmol/L (ref 3.5–5.1)
Sodium: 136 mmol/L (ref 135–145)

## 2023-06-12 ENCOUNTER — Ambulatory Visit (INDEPENDENT_AMBULATORY_CARE_PROVIDER_SITE_OTHER): Payer: Medicare HMO

## 2023-06-12 VITALS — Ht 60.0 in | Wt 150.0 lb

## 2023-06-12 DIAGNOSIS — Z1212 Encounter for screening for malignant neoplasm of rectum: Secondary | ICD-10-CM

## 2023-06-12 DIAGNOSIS — Z1211 Encounter for screening for malignant neoplasm of colon: Secondary | ICD-10-CM | POA: Diagnosis not present

## 2023-06-12 DIAGNOSIS — Z0000 Encounter for general adult medical examination without abnormal findings: Secondary | ICD-10-CM

## 2023-06-12 NOTE — Patient Instructions (Addendum)
[name] , Thank you for taking time to come for your Medicare Wellness Visit. I appreciate your ongoing commitment to your health goals. Please review the following plan we discussed and let me know if I can assist you in the future.   Referrals/Orders/Follow-Ups/Clinician Recommendations: You are doing well with your health maintenance.  I did place a referral for you to have a colonoscopy at [facility], : [address] [place] [address], [place], [place] [address].  Please call [phone] to schedule a visit.  It was nice to speak with you today.  Keep up the good work.  This is a list of the screening recommended for you and due dates:  Health Maintenance  Topic Date Due   DTaP/Tdap/Td vaccine (1 - Tdap) Never done   Colon Cancer Screening  02/25/2023   Eye exam for diabetics  07/20/2023   COVID-19 Vaccine (5 - 2023-24 season) 07/24/2023   Yearly kidney health urinalysis for diabetes  10/03/2023   Complete foot exam   10/03/2023   Hemoglobin A1C  10/03/2023   Yearly kidney function blood test for diabetes  05/30/2024   Medicare Annual Wellness Visit  06/11/2024   DEXA scan (bone density measurement)  04/08/2025   Pneumonia Vaccine  Completed   Flu Shot  Completed   Zoster (Shingles) Vaccine  Completed   HPV Vaccine  Aged Out    Advanced directives: (Copy Requested) Please bring a copy of your health care power of attorney and living will to the office to be added to your chart at your convenience.  Next Medicare Annual Wellness Visit scheduled for next year: Yes

## 2023-06-12 NOTE — Progress Notes (Signed)
Subjective:   Anita Duke is a 82 y.o. female who presents for Medicare Annual (Subsequent) preventive examination.  Visit Complete: Virtual I connected with  Anita Duke on 06/12/23 by a audio enabled telemedicine application and verified that I am speaking with the correct person using two identifiers.  Patient Location: Other:  Retired Aeronautical engineer  I discussed the limitations of evaluation and management by telemedicine. The patient expressed understanding and agreed to proceed.  Vital Signs: Because this visit was a virtual/telehealth visit, some criteria may be missing or patient reported. Any vitals not documented were not able to be obtained and vitals that have been documented are patient reported.  Because this visit was a virtual/telehealth visit, some criteria may be missing or patient reported. Any vitals not documented were not able to be obtained and vitals that have been documented are patient reported.    Cardiac Risk Factors include: advanced age (>21men, >31 women);hypertension;dyslipidemia     Objective:    Today's Vitals   06/12/23 0816  Weight: 150 lb (68 kg)  Height: 5' (1.524 m)   Body mass index is 29.29 kg/m.     06/12/2023    8:26 AM 11/25/2022    2:25 PM 11/25/2022    9:04 AM 06/28/2022    9:24 AM 06/26/2022    9:04 AM 06/22/2022    2:42 PM 07/19/2021    8:49 AM  Advanced Directives  Does Patient Have a Medical Advance Directive? Yes No No No Yes No No  Type of Estate agent of [place];Living will    Healthcare Power of Attorney    Does patient want to make changes to medical advance directive?    No - Patient declined     Copy of Healthcare Power of Attorney in Chart? No - copy requested    No - copy requested    Would patient like information on creating a medical advance directive?  No - Patient declined    No - Patient declined     Current Medications (verified) Outpatient  Encounter Medications as of 06/12/2023  Medication Sig   atorvastatin (LIPITOR) 40 MG tablet TAKE 1 TABLET DAILY   Baclofen 5 MG TABS Take 2-3 tablets (10-15 mg total) by mouth at bedtime as needed.   carvedilol (COREG) 12.5 MG tablet TAKE 1 TABLET TWICE DAILY  WITH MEALS   cholecalciferol (VITAMIN D) 1000 UNITS tablet Take 1,000 Units by mouth daily.    cyanocobalamin (VITAMIN B12) 500 MCG tablet Take 500 mcg by mouth daily.   empagliflozin (JARDIANCE) 25 MG TABS tablet Take 1 tablet (25 mg total) by mouth daily.   ENTRESTO 24-26 MG TAKE 1 TABLET TWICE A DAY   fish oil-omega-3 fatty acids 1000 MG capsule Take 1 g by mouth 2 (two) times daily.   furosemide (LASIX) 20 MG tablet Take 1 tablet (20 mg total) by mouth daily as needed (for swelling or a weight gain of 3 pounds or more in 24 hours or 5 pounds in one week.).   Polyethyl Glycol-Propyl Glycol (SYSTANE) 0.4-0.3 % SOLN Place 1 drop into both eyes daily.   potassium chloride SA (KLOR-CON M) 20 MEQ tablet Take 2 tablets (40 mEq total) by mouth daily.   RYBELSUS 3 MG TABS TAKE 1 TABLET DAILY   sodium chloride (OCEAN) 0.65 % SOLN nasal spray Place 1 spray into both nostrils 2 (two) times daily.   spironolactone (ALDACTONE) 25 MG tablet TAKE 1 TABLET DAILY  tamoxifen (NOLVADEX) 20 MG tablet Take 1 tablet (20 mg total) by mouth daily.   venlafaxine XR (EFFEXOR-XR) 37.5 MG 24 hr capsule TAKE 1 CAPSULE DAILY   warfarin (JANTOVEN) 2.5 MG tablet TAKE 1 TABLET DAILY EXCEPT TAKE 1 1/2 TABLETS ON FRIDAYS OR AS  DIRECTED BY ANTICOAGULATION CLINIC   amoxicillin (AMOXIL) 500 MG capsule TAKE 4 CAPSULES PRIOR TO DENTAL APPOINTMENTS AS DIRECTED (Patient not taking: Reported on 06/12/2023)   cetirizine (ZYRTEC) 10 MG tablet Take 10 mg by mouth daily. (Patient not taking: Reported on 06/12/2023)   No facility-administered encounter medications on file as of 06/12/2023.    Allergies (verified) Demerol [meperidine], Morphine, Januvia [sitagliptin], Metformin  and related, and Tetanus toxoid   History: 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   Breast cancer (HCC)    CAD (coronary artery disease)    LIMA to the LAD at time of mitral valve repair / LIMA atretic,, February, 2011   Cataracts, bilateral    removed bilat    Cecal cancer 2014   colon   CHF (congestive heart failure)    Chronic systolic dysfunction of left ventricle 05/21/2013   Clotting disorder    21 yrs ago blood clot in heart    Colon cancer (HCC)    Colon polyp, hyperplastic    Diabetes mellitus type II, controlled 03/02/2009   Diabetic neuropathy 02/21/2018   [facility]   Diverticulosis of colon    Diverticulosis of large intestine 08/04/2002   Ejection fraction < 50%    EF 30%, New diagnosis   february, 2011, etiology not clear, consider rate related tachycardia, and not use carvedilol, catheterization no constriction, /    EF 55-60% echo, June, 2011 /     Beta blocker stopped May, 2012 with reactive airway disease.  Patient does not tolerate metoprolol.  Carvedilol stopped  /   EF 55%, echo, February, 2012  //   EF 40-45%, septal dyssynergy, hypokinesis of the a   Essential hypertension 07/07/2008   Family history of adverse reaction to anesthesia    sister had a hard time waking up after anesthesia   Family history of colon cancer 04/29/2021   GERD (gastroesophageal reflux disease)    Heart murmur    Hemorrhoids, internal    High cholesterol    History of CABG    Hyperlipidemia 07/07/2008   Lumbar radiculopathy, acute 06/17/2018   Major depressive disorder 07/19/2018   Mitral regurgitation    a. s/p repair with LAA ligation and MAZE at time of  surgery   Mitral stenosis    Mild, February, 2011, post mitral valve repair / mild, echo, June, 2011  //  Mild functional mitral stenosis, echo, February, 2012  //   mild stenosis, echo, December, 2013  //  mild, echo, August, 2014   //   Mild, echo, April, 2016    Osteopenia    left hip    Pacemaker-Medtronic 05/20/2012   Pacemaker placed September, 2013   //   upgraded to biventricular CRT pacemaker November 04, 2013    Permanent atrial fibrillation    a.  s/p MAZE b. s/p AVN ablation   Personal history of colonic polyps 04/29/2021   PONV (postoperative nausea and vomiting)    Pulmonary HTN    QT prolongation    Tikosyn and Effexor. QT prolonged October 13, 2011, peak is in dose reduced from 500  to -250 twice a day   Right ventricular dysfunction    Mild to moderate, echo, February, 2011 / normalized echo, June, 2011 /  RV normal, echo, February, 2012  //   right ventricle reported as good echo, December, 2013    S/P mitral valve repair    [facility] / Maze procedure/ atrial appendage removed were tied off    Sick sinus syndrome    s/p Medtronic dual chamber PPM 05/17/12    Sleep difficulties 09/13/2019   Vitamin D deficiency 02/20/2014   Past Surgical History:  Procedure Laterality Date   ABDOMINAL HYSTERECTOMY  ?2002   AV NODE ABLATION N/A 05/28/2012   Procedure: AV NODE ABLATION;  Surgeon: Hillis Range, MD;  Location: [facility] CATH LAB;  Service: Cardiovascular;  Laterality: N/A;   BI-VENTRICULAR PACEMAKER UPGRADE N/A 11/04/2013   upgrade of previously implanted dual chamber pacemaker to STJ Quadra Allure CRTP by Dr Johney Frame   BIV PACEMAKER GENERATOR CHANGEOUT N/A 06/22/2022   Procedure: BIV PACEMAKER GENERATOR CHANGEOUT;  Surgeon: Regan Lemming, MD;  Location: [facility] INVASIVE CV LAB;  Service: Cardiovascular;  Laterality: N/A;   BREAST EXCISIONAL BIOPSY Left over 10 years ago   benign   BREAST LUMPECTOMY Left 05/26/2021   Procedure: LEFT BREAST LUMPECTOMY;  Surgeon: Almond Lint, MD;   Location: [facility] OR;  Service: General;  Laterality: Left;   BREAST SURGERY     CARDIOVERSION  02/08/2012   Procedure: CARDIOVERSION;  Surgeon: [name], MD;  Location: [facility] OR;  Service: Cardiovascular;  Laterality: N/A;   CARDIOVERSION  02/29/2012   Procedure: CARDIOVERSION;  Surgeon: [name], MD;  Location: [facility] OR;  Service: Cardiovascular;  Laterality: N/A;   CARDIOVERSION  05/15/2012   Procedure: CARDIOVERSION;  Surgeon: [name], MD;  Location: [facility] ENDOSCOPY;  Service: Cardiovascular;  Laterality: N/A;   CATARACT EXTRACTION, BILATERAL  2017   CHOLECYSTECTOMY  1995   COLON SURGERY  2014   right hemicolectomy    COLONOSCOPY     CORONARY ARTERY BYPASS GRAFT  2001   CABG X1 "at time of mitral valve repair" (12/04/2012   DIAGNOSTIC LAPAROSCOPIC LIVER BIOPSY Left 12/03/2012   Procedure: DIAGNOSTIC LAPAROSCOPIC LIVER BIOPSY;  Surgeon: Almond Lint, MD;  Location: [facility] OR;  Service: General;  Laterality: Left;   EYE SURGERY     bilateral cataract surgery   LAPAROSCOPIC RIGHT HEMI COLECTOMY  12/03/2012   Procedure: LAPAROSCOPIC RIGHT HEMI COLECTOMY;  Surgeon: Almond Lint, MD;  Location: [facility] OR;  Service: General;;   LUMBAR LAMINECTOMY/ DECOMPRESSION WITH MET-RX Left 06/17/2018   Procedure: left Lumbar three-fourextraforaminal Microdiscectomy with Met-Rx;  Surgeon: Barnett Abu, MD;  Location: [facility] OR;  Service: Neurosurgery;  Laterality: Left;   MAZE  2001   w/ MVR & CABG   MITRAL VALVE REPAIR  2001   "anterior and posterior leaflets" (12/04/2012)   PERMANENT PACEMAKER INSERTION N/A 05/17/2012   MDT Adapta L implanted by Dr Johney Frame for tachy/brady syndrome   POLYPECTOMY     RADIOACTIVE SEED GUIDED EXCISIONAL BREAST BIOPSY Right 07/04/2022   Procedure: RADIOACTIVE SEED GUIDED EXCISIONAL RIGHT BREAST BIOPSY;  Surgeon: Almond Lint, MD;  Location: [facility] OR;  Service: General;  Laterality: Right;   TONSILLECTOMY AND  ADENOIDECTOMY  ~ 1950   UMBILICAL HERNIA REPAIR N/A 12/03/2012    Procedure: HERNIA REPAIR UMBILICAL ADULT;  Surgeon: Almond Lint, MD;  Location: [facility] OR;  Service: General;  Laterality: N/A;   Family History  Problem Relation Age of Onset   Diabetes Mother    Kidney disease Mother    Thyroid disease Mother    Alzheimer's disease Mother        Symptom onset in late 49s; confirmed via autopsy   Depression Mother    Heart disease Father    Diabetes Father    Heart disease Sister        x 2   Colon cancer Maternal Aunt        dx 65s; mother's paternal half sister   Colon cancer Maternal Aunt        dx late 4s   Heart disease Paternal Uncle        x 6   Cancer Maternal Grandfather        unknown type; dx unknown age   Diabetes Paternal Grandmother    Diabetes Paternal Grandfather    Colon cancer Cousin    Brain cancer Nephew 52   Colon polyps Neg Hx    Adrenal disorder Neg Hx    Esophageal cancer Neg Hx    Rectal cancer Neg Hx    Stomach cancer Neg Hx    Social History   Socioeconomic History   Marital status: Widowed    Spouse name: Not on file   Number of children: 0   Years of education: 12   Highest education level: High school graduate  Occupational History   Occupation: Retired    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    Employer: Engineer, maintenance (IT): [employer]   Tobacco Use   Smoking status: Never   Smokeless tobacco: Never  Vaping Use   Vaping status: Never Used  Substance and Sexual Activity   Alcohol use: No   Drug use: No   Sexual activity: Not Currently    Birth control/protection: Surgical  Other Topics Concern   Not on file  Social History Narrative   No caffeine   Right handed    Lives alone in a retired home Office manager)   Has a Nurse, mental health   Social Determinants of Health   Financial Resource Strain: Low Risk  (06/12/2023)   Overall Financial Resource Strain (CARDIA)    Difficulty of Paying Living Expenses: Not hard at all  Food Insecurity: No Food Insecurity (06/12/2023)   Hunger Vital Sign     Worried About Running Out of Food in the Last Year: Never true    Ran Out of Food in the Last Year: Never true  Transportation Needs: No Transportation Needs (06/12/2023)   PRAPARE - Administrator, Civil Service (Medical): No    Lack of Transportation (Non-Medical): No  Physical Activity: Sufficiently Active (06/12/2023)   Exercise Vital Sign    Days of Exercise per Week: 7 days    Minutes of Exercise per Session: 30 min  Stress: No Stress Concern Present (06/12/2023)   [employer] of Occupational Health - Occupational Stress Questionnaire    Feeling of Stress : Not at all  Social Connections: Moderately Isolated (06/12/2023)   Social Connection and Isolation Panel [NHANES]    Frequency of Communication with Friends and Family: More than three times a week    Frequency of Social Gatherings with Friends and Family: More than three times a  week    Attends Religious Services: More than 4 times per year    Active Member of Clubs or Organizations: No    Attends Banker Meetings: Never    Marital Status: Widowed    Tobacco Counseling Counseling given: Not Answered   Clinical Intake:  Pre-visit preparation completed: Yes  Pain : No/denies pain     BMI - recorded: 29.29 Nutritional Status: BMI 25 -29 Overweight  How often do you need to have someone help you when you read instructions, pamphlets, or other written materials from your doctor or pharmacy?: 1 - Never  Interpreter Needed?: No  Information entered by :: Gifford Ballon, RMA   Activities of Daily Living    06/12/2023    8:19 AM 11/25/2022    2:27 PM  In your present state of health, do you have any difficulty performing the following activities:  Hearing? 0 0  Vision? 0 0  Difficulty concentrating or making decisions? 0 0  Walking or climbing stairs? 0 0  Dressing or bathing? 0 0  Doing errands, shopping? 0 0  Preparing Food and eating ? N   Using the Toilet? N   In the past six  months, have you accidently leaked urine? Y   Do you have problems with loss of bowel control? N   Managing your Medications? N   Managing your Finances? N   Housekeeping or managing your Housekeeping? N     Patient Care Team: Pincus Sanes, MD as PCP - General (Internal Medicine) Laurey Morale, MD as PCP - Cardiology (Cardiology) Regan Lemming, MD as PCP - Electrophysiology (Cardiology) [name], MD as Consulting Physician (Cardiology) Louis Meckel, MD (Inactive) as Consulting Physician (Gastroenterology) Van Clines, MD as Consulting Physician (Neurology) Malachy Mood, MD as Consulting Physician (Hematology) Almond Lint, MD as Consulting Physician (General Surgery) Pershing Proud, RN as Oncology Nurse Navigator Donnelly Angelica, RN as Oncology Nurse Navigator Pollyann Samples, NP as Nurse Practitioner (Nurse Practitioner) Maris Berger, MD as Consulting Physician (Ophthalmology)  Indicate any recent Medical Services you may have received from other than [facility] providers in the past year (date may be approximate).     Assessment:   This is a routine wellness examination for [place].  Hearing/Vision screen Hearing Screening - Comments:: Denies hearing difficulties   Vision Screening - Comments:: Wears eyeglasses   Goals Addressed             This Visit's Progress    Stay as active and as independent as possible.   On track     Depression Screen    06/12/2023    8:31 AM 12/05/2022    3:05 PM 10/02/2022    9:15 AM 06/26/2022    9:08 AM 03/29/2022    9:48 AM 09/20/2021   12:49 PM 03/16/2021    8:53 AM  PHQ 2/9 Scores  PHQ - 2 Score 0 0 0 0 0 0 0  PHQ- 9 Score 1  0  3 0 0    Fall Risk    06/12/2023    8:26 AM 12/27/2022    2:11 PM 10/02/2022    9:14 AM 06/26/2022    9:05 AM 03/29/2022    9:47 AM  Fall Risk   Falls in the past year? 0 0 0 0 0  Number falls in past yr: 0 0 0 0 0  Injury with Fall? 0 0 0 0 0  Risk for fall due to :  No Fall  Risks No Fall Risks No Fall Risks No Fall Risks No Fall Risks  Follow up Falls prevention discussed;Falls evaluation completed Falls evaluation completed Falls evaluation completed Falls prevention discussed Falls evaluation completed    MEDICARE RISK AT HOME: Medicare Risk at Home Any stairs in or around the home?: No Home free of loose throw rugs in walkways, pet beds, electrical cords, etc?: Yes Adequate lighting in your home to reduce risk of falls?: Yes Life alert?: Yes Use of a cane, walker or w/c?: No Grab bars in the bathroom?: Yes Shower chair or bench in shower?: Yes Elevated toilet seat or a handicapped toilet?: Yes  TIMED UP AND GO:  Was the test performed?  No    Cognitive Function:    06/20/2017   10:33 AM  MMSE - Mini Mental State Exam  Orientation to time 5  Orientation to Place 5  Registration 3  Attention/ Calculation 5  Recall 3  Language- name 2 objects 2  Language- repeat 1  Language- follow 3 step command 3  Language- read & follow direction 1  Write a sentence 1  Copy design 1  Total score 30      07/19/2021   12:00 PM  Montreal Cognitive Assessment   Visuospatial/ Executive (0/5) 2  Naming (0/3) 3  Attention: Read list of digits (0/2) 1  Attention: Read list of letters (0/1) 1  Attention: Serial 7 subtraction starting at 100 (0/3) 2  Language: Repeat phrase (0/2) 2  Language : Fluency (0/1) 0  Abstraction (0/2) 2  Delayed Recall (0/5) 4  Orientation (0/6) 6  Total 23  Adjusted Score (based on education) 23      06/12/2023    8:26 AM 06/26/2022    9:16 AM  6CIT Screen  What Year? 0 points 0 points  What month? 0 points 0 points  What time? 0 points 0 points  Count back from 20 0 points 0 points  Months in reverse 0 points 0 points  Repeat phrase 0 points 0 points  Total Score 0 points 0 points    Immunizations Immunization History  Administered Date(s) Administered   Fluad Trivalent(High Dose 65+) 05/16/2023   Influenza  Split 06/24/2011, 05/14/2017   Influenza Whole 06/04/2012, 05/16/2013   Influenza, High Dose Seasonal PF 05/14/2019   Influenza,inj,quad, With Preservative 05/14/2017   Influenza-Unspecified 05/23/2018, 06/23/2020, 07/06/2021, 06/08/2022   PFIZER(Purple Top)SARS-COV-2 Vaccination 09/18/2019, 10/09/2019, 06/11/2020, 05/29/2023   Pneumococcal Conjugate-13 08/25/2014   Pneumococcal Polysaccharide-23 08/10/2011   Zoster Recombinant(Shingrix) 08/14/2018, 10/29/2018   Zoster, Live 05/23/2006    TDAP status: Due, Education has been provided regarding the importance of this vaccine. Advised may receive this vaccine at local pharmacy or Health Dept. Aware to provide a copy of the vaccination record if obtained from local pharmacy or Health Dept. Verbalized acceptance and understanding.  Flu Vaccine status: Up to date  Pneumococcal vaccine status: Up to date  Covid-19 vaccine status: Completed vaccines  Qualifies for Shingles Vaccine? Yes   Zostavax completed Yes   Shingrix Completed?: Yes  Screening Tests Health Maintenance  Topic Date Due   DTaP/Tdap/Td (1 - Tdap) Never done   Colonoscopy  02/25/2023   OPHTHALMOLOGY EXAM  07/20/2023   COVID-19 Vaccine (5 - 2023-24 season) 07/24/2023   Diabetic kidney evaluation - Urine ACR  10/03/2023   FOOT EXAM  10/03/2023   HEMOGLOBIN A1C  10/03/2023   Diabetic kidney evaluation - eGFR measurement  05/30/2024   Medicare Annual Wellness (AWV)  06/11/2024  DEXA SCAN  04/08/2025   Pneumonia Vaccine 63+ Years old  Completed   INFLUENZA VACCINE  Completed   Zoster Vaccines- Shingrix  Completed   HPV VACCINES  Aged Out    Health Maintenance  Health Maintenance Due  Topic Date Due   DTaP/Tdap/Td (1 - Tdap) Never done   Colonoscopy  02/25/2023    Colorectal cancer screening: Referral to GI placed 06/12/2023. Pt aware the office will call re: appt.  Mammogram status: Completed 11/15/2022. Repeat every year  Bone Density status: Completed  04/09/2023. Results reflect: Bone density results: OSTEOPENIA. Repeat every 2 years.  Lung Cancer Screening: (Low Dose CT Chest recommended if Age 73-80 years, 20 pack-year currently smoking OR have quit w/in 15years.) does not qualify.   Lung Cancer Screening Referral: N/A  Additional Screening:  Hepatitis C Screening: does not qualify;   Vision Screening: Recommended annual ophthalmology exams for early detection of glaucoma and other disorders of the eye. Is the patient up to date with their annual eye exam?  Yes  Who is the provider or what is the name of the office in which the patient attends annual eye exams? Dr. Charlotte Sanes If pt is not established with a provider, would they like to be referred to a provider to establish care? No .   Dental Screening: Recommended annual dental exams for proper oral hygiene  Diabetic Foot Exam: Diabetic Foot Exam: Completed 10/02/2022  Community Resource Referral / Chronic Care Management: CRR required this visit?  No   CCM required this visit?  No     Plan:     I have personally reviewed and noted the following in the patient's chart:   Medical and social history Use of alcohol, tobacco or illicit drugs  Current medications and supplements including opioid prescriptions. Patient is not currently taking opioid prescriptions. Functional ability and status Nutritional status Physical activity Advanced directives List of other physicians Hospitalizations, surgeries, and ER visits in previous 12 months Vitals Screenings to include cognitive, depression, and falls Referrals and appointments  In addition, I have reviewed and discussed with patient certain preventive protocols, quality metrics, and best practice recommendations. A written personalized care plan for preventive services as well as general preventive health recommendations were provided to patient.     Delmo Matty L Keziyah Kneale, CMA   06/12/2023   After Visit Summary: (MyChart) Due to  this being a telephonic visit, the after visit summary with patients personalized plan was offered to patient via MyChart   Nurse Notes: Patient is due for a colonoscopy, as her last one was in 2021 and it gave her 3 years to come back.  She is due for a Tdap but declines due to an allergy reaction when she got it once before.  Patient would like to discuss with PCP if she should still get it.  I placed an order in today and patient is aware, however she would like to hear from Dr. Lawerance Bach in regards to it.  Patient states that she has an eye exam coming up in December 2024.  She is up to date on all other health maintenance.  Patient had no other concerns to address today.

## 2023-06-19 ENCOUNTER — Encounter: Payer: Self-pay | Admitting: Internal Medicine

## 2023-06-19 DIAGNOSIS — C50412 Malignant neoplasm of upper-outer quadrant of left female breast: Secondary | ICD-10-CM | POA: Diagnosis not present

## 2023-06-19 DIAGNOSIS — Z170 Estrogen receptor positive status [ER+]: Secondary | ICD-10-CM | POA: Diagnosis not present

## 2023-06-19 DIAGNOSIS — D126 Benign neoplasm of colon, unspecified: Secondary | ICD-10-CM | POA: Insufficient documentation

## 2023-06-19 DIAGNOSIS — C180 Malignant neoplasm of cecum: Secondary | ICD-10-CM | POA: Diagnosis not present

## 2023-06-19 DIAGNOSIS — N6489 Other specified disorders of breast: Secondary | ICD-10-CM | POA: Diagnosis not present

## 2023-06-22 ENCOUNTER — Ambulatory Visit: Payer: Medicare HMO

## 2023-06-22 ENCOUNTER — Ambulatory Visit (INDEPENDENT_AMBULATORY_CARE_PROVIDER_SITE_OTHER): Payer: Medicare HMO

## 2023-06-22 DIAGNOSIS — I255 Ischemic cardiomyopathy: Secondary | ICD-10-CM

## 2023-06-24 ENCOUNTER — Other Ambulatory Visit (HOSPITAL_COMMUNITY): Payer: Self-pay | Admitting: Cardiology

## 2023-06-29 DIAGNOSIS — E785 Hyperlipidemia, unspecified: Secondary | ICD-10-CM | POA: Diagnosis not present

## 2023-06-29 DIAGNOSIS — E1122 Type 2 diabetes mellitus with diabetic chronic kidney disease: Secondary | ICD-10-CM | POA: Diagnosis not present

## 2023-06-29 DIAGNOSIS — M1990 Unspecified osteoarthritis, unspecified site: Secondary | ICD-10-CM | POA: Diagnosis not present

## 2023-06-29 DIAGNOSIS — F325 Major depressive disorder, single episode, in full remission: Secondary | ICD-10-CM | POA: Diagnosis not present

## 2023-06-29 DIAGNOSIS — E1165 Type 2 diabetes mellitus with hyperglycemia: Secondary | ICD-10-CM | POA: Diagnosis not present

## 2023-06-29 DIAGNOSIS — M8580 Other specified disorders of bone density and structure, unspecified site: Secondary | ICD-10-CM | POA: Diagnosis not present

## 2023-06-29 DIAGNOSIS — K5900 Constipation, unspecified: Secondary | ICD-10-CM | POA: Diagnosis not present

## 2023-06-29 DIAGNOSIS — I509 Heart failure, unspecified: Secondary | ICD-10-CM | POA: Diagnosis not present

## 2023-06-29 DIAGNOSIS — I2510 Atherosclerotic heart disease of native coronary artery without angina pectoris: Secondary | ICD-10-CM | POA: Diagnosis not present

## 2023-06-29 DIAGNOSIS — I252 Old myocardial infarction: Secondary | ICD-10-CM | POA: Diagnosis not present

## 2023-06-29 DIAGNOSIS — I255 Ischemic cardiomyopathy: Secondary | ICD-10-CM | POA: Diagnosis not present

## 2023-06-29 DIAGNOSIS — D6869 Other thrombophilia: Secondary | ICD-10-CM | POA: Diagnosis not present

## 2023-07-03 ENCOUNTER — Ambulatory Visit: Payer: Medicare HMO

## 2023-07-03 ENCOUNTER — Ambulatory Visit (INDEPENDENT_AMBULATORY_CARE_PROVIDER_SITE_OTHER): Payer: Medicare HMO

## 2023-07-03 DIAGNOSIS — Z7901 Long term (current) use of anticoagulants: Secondary | ICD-10-CM | POA: Diagnosis not present

## 2023-07-03 LAB — POCT INR: INR: 2.4 (ref 2.0–3.0)

## 2023-07-03 NOTE — Progress Notes (Signed)
Continue 1 tablet daily except take 1 1/2 tablets on Fridays and recheck in 6 weeks.

## 2023-07-03 NOTE — Patient Instructions (Addendum)
Pre visit review using our clinic review tool, if applicable. No additional management support is needed unless otherwise documented below in the visit note.  Continue 1 tablet daily except take 1 1/2 tablets on Fridays and recheck in 6 weeks.

## 2023-07-04 NOTE — Progress Notes (Signed)
Remote pacemaker transmission.   

## 2023-07-11 NOTE — Assessment & Plan Note (Signed)
stage IA, pT1cN0, ER+/PR+/HER2-, Grade 2  -presented with palpable left breast lump. 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 Otherwise, no mutations. -she underwent lumpectomy on 05/26/21 with Dr. Donell Beers showing: IDC, 1.5 cm, grade 2; DCIS, intermediate grade; resection margins negative. -she began tamoxifen on 09/13/21. She reports weight gain and hot flashes. -she is clinically doing well from a breast cancer standpoint. Labs reviewed, overall no concern. Physical exam was unremarkable. There is no clinical concern for recurrence. -she will be due for annual mammogram in 03/2023; I ordered for her today. -Continue tamoxifen, we discussed healthy diet and exercise, and weight watch.

## 2023-07-11 NOTE — Progress Notes (Signed)
Patient Care Team: Pincus Sanes, MD as PCP - General (Internal Medicine) Laurey Morale, MD as PCP - Cardiology (Cardiology) Regan Lemming, MD as PCP - Electrophysiology (Cardiology) [name], MD as Consulting Physician (Cardiology) Louis Meckel, MD (Inactive) as Consulting Physician (Gastroenterology) Van Clines, MD as Consulting Physician (Neurology) Malachy Mood, MD as Consulting Physician (Hematology) Almond Lint, MD as Consulting Physician (General Surgery) Pershing Proud, RN as Oncology Nurse Navigator Donnelly Angelica, RN as Oncology Nurse Navigator Pollyann Samples, NP as Nurse Practitioner (Nurse Practitioner) Maris Berger, MD as Consulting Physician (Ophthalmology)  Clinic Day:  07/15/2023  Referring physician: Pincus Sanes, MD  ASSESSMENT & PLAN:   Assessment & Plan: 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 Otherwise, no mutations. -she underwent lumpectomy on 05/26/21 with Dr. Donell Beers showing: IDC, 1.5 cm, grade 2; DCIS, intermediate grade; resection margins negative. -she began tamoxifen on 09/13/21. She reports weight gain and hot flashes. -she is clinically doing well from a breast cancer standpoint. Labs reviewed, overall no concern. Physical exam was unremarkable. There is no clinical concern for recurrence. -she will be due for annual mammogram in 03/2023; I ordered for her today. -Continue tamoxifen, we discussed healthy diet and exercise, and weight watch.   Plan: Labs reviewed  -CBC showing WBC 5.7; Hgb 14.0; Hct 43.3; Plt 167; Anc 3.9 -CMP - K 4.1; glucose 200; BUN 17; Creatinine 0.72; eGFR >60; Ca 9.0; LFTs normal.   Reviewed recent bone density test showing osteopenia. Encouraged her to add calcium  daily to her daily vitamin d.  Advised that she stay physically active.  Continue tamoxifen daily  Continue effexor daily to manage effects of tamoxifen.  3D diagnostic mammogram ordered today Labs and follow up in 6 months  The patient understands the plans discussed today and is in agreement with them.  She knows to contact our office if she develops concerns prior to her next appointment.  I provided 25 minutes of face-to-face time during this encounter and > 50% was spent counseling as documented under my assessment and plan.    Carlean Jews, NP  [facility] - A DEPT OF [facility] [address] [place] [address] Dept: [phone] Dept Fax: [phone]   Orders Placed This Encounter  Procedures   MM 3D DIAGNOSTIC MAMMOGRAM BILATERAL BREAST    AETNA MCR PF; : 11/15/2022@BCG /    Standing Status:   Future    Standing Expiration Date:   07/11/2024    Order Specific Question:   Reason for Exam (SYMPTOM  OR DIAGNOSIS REQUIRED)    Answer:   breast cancer follow up    Order Specific Question:   Preferred imaging location?    [facility]      CHIEF COMPLAINT:  CC: f/u left breast cancer  Current Treatment:  tamoxifen starting 09/13/2021  INTERVAL HISTORY:  Anita Duke is here today for repeat clinical assessment. She last saw Dr. Mosetta Putt on 01/08/2023. Currently on tamoxifen, started on 09/13/2021. Goal is for 5 years of treatment. She did have bone density test done 04/12/2023. She has low bone density. Risk for osteoporotic fracture of any sort in next 10 years is 13.5% and 3.6% for osteoporotic hip  fracture. Advised she add daily calcium to the vitamin d she is already taking. Encouraged her to remain physically active. Continue tamoxifen daily.  She denies fevers or chills. She denies pain. Her appetite is good. Her weight has increased 3 pounds over last 3 months .  I have reviewed the past medical  history, past surgical history, social history and family history with the patient and they are unchanged from previous note.  ALLERGIES:  is allergic to demerol [meperidine], morphine, januvia [sitagliptin], metformin and related, and tetanus toxoid.  MEDICATIONS:  Current Outpatient Medications  Medication Sig Dispense Refill   atorvastatin (LIPITOR) 40 MG tablet TAKE 1 TABLET DAILY 90 tablet 3   carvedilol (COREG) 12.5 MG tablet TAKE 1 TABLET TWICE DAILY  WITH MEALS 180 tablet 3   cholecalciferol (VITAMIN D) 1000 UNITS tablet Take 1,000 Units by mouth daily.      COMIRNATY syringe      cyanocobalamin (VITAMIN B12) 500 MCG tablet Take 500 mcg by mouth daily.     empagliflozin (JARDIANCE) 25 MG TABS tablet Take 1 tablet (25 mg total) by mouth daily. 30 tablet 3   fish oil-omega-3 fatty acids 1000 MG capsule Take 1 g by mouth 2 (two) times daily.     Polyethyl Glycol-Propyl Glycol (SYSTANE) 0.4-0.3 % SOLN Place 1 drop into both eyes daily.     RYBELSUS 3 MG TABS TAKE 1 TABLET DAILY 30 tablet 2   sacubitril-valsartan (ENTRESTO) 24-26 MG TAKE 1 TABLET TWICE A DAY 60 tablet 11   sodium chloride (OCEAN) 0.65 % SOLN nasal spray Place 1 spray into both nostrils 2 (two) times daily.     spironolactone (ALDACTONE) 25 MG tablet TAKE 1 TABLET DAILY 90 tablet 3   tamoxifen (NOLVADEX) 20 MG tablet Take 1 tablet (20 mg total) by mouth daily. 90 tablet 2   venlafaxine XR (EFFEXOR-XR) 37.5 MG 24 hr capsule TAKE 1 CAPSULE DAILY 90 capsule 2   warfarin (JANTOVEN) 2.5 MG tablet TAKE 1 TABLET DAILY EXCEPT TAKE 1 1/2 TABLETS ON FRIDAYS OR AS  DIRECTED BY ANTICOAGULATION CLINIC 105 tablet 3   amoxicillin (AMOXIL) 500 MG capsule TAKE 4 CAPSULES PRIOR TO DENTAL APPOINTMENTS AS DIRECTED (Patient not taking: Reported on 06/12/2023) 20 capsule 0   cetirizine (ZYRTEC) 10 MG tablet Take 10 mg by mouth daily. (Patient not taking: Reported on 06/12/2023)     No current facility-administered medications for this visit.     HISTORY OF PRESENT ILLNESS:   Oncology History Overview Note  Cancer Staging Malignant neoplasm of central portion of left breast [facility]) Staging form: Breast, AJCC 8th Edition - Clinical stage from 04/11/2021: Stage IA (cT1c, cN0, cM0, G2, ER+, PR+, HER2-) - Signed by Malachy Mood, MD on 04/27/2021    Malignant neoplasm of central portion of left breast (HCC)  04/09/2021 Mammogram   Bilateral Diagnostic, and Left Breast US  FINDINGS: Spiculated mass in the left breast containing calcifications at 3 o'clock, 6 cm from the nipple measuring 11 x 9 x 9 mm. No axillary adenopathy.     04/11/2021 Cancer Staging   Staging form: Breast, AJCC 8th Edition - Clinical stage from 04/11/2021: Stage IA (cT1c, cN0, cM0, G2, ER+, PR+, HER2-) - Signed by Malachy Mood, MD on 04/27/2021 Stage prefix: Initial diagnosis Histologic grading system: 3 grade system   04/11/2021 Pathology Results   Diagnosis Breast, left, needle core biopsy, 3:00 - INVASIVE DUCTAL CARCINOMA, SEE COMMENT. - DUCTAL CARCINOMA IN SITU. Microscopic Comment The carcinoma appears grade 2.  PROGNOSTIC INDICATORS Results: IMMUNOHISTOCHEMICAL AND MORPHOMETRIC ANALYSIS PERFORMED MANUALLY The tumor cells are NEGATIVE for Her2 (1+). 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  No other pathogenic variants detected in [facility] CancerNext-Expanded +RNAinsight Panel. The report date is May 17, 2021.    The CancerNext-Expanded gene panel offered by [facility] and includes sequencing, rearrangement, and RNA  analysis for the following 77 genes: AIP, ALK, APC, ATM, AXIN2, BAP1, BARD1, BLM, BMPR1A, BRCA1, BRCA2, BRIP1, CDC73, CDH1, CDK4, CDKN1B, CDKN2A, CHEK2, CTNNA1, DICER1, FANCC, FH, FLCN, GALNT12, KIF1B, LZTR1, MAX, MEN1, MET, MLH1, MSH2, MSH3, MSH6, MUTYH, NBN, NF1, NF2, NTHL1, PALB2, PHOX2B, PMS2, POT1, PRKAR1A, PTCH1, PTEN, RAD51C, RAD51D, RB1, RECQL, RET, SDHA, SDHAF2, SDHB, SDHC, SDHD, SMAD4, SMARCA4, SMARCB1, SMARCE1, STK11, SUFU, TMEM127, TP53, TSC1, TSC2, VHL and XRCC2 (sequencing and deletion/duplication); EGFR, EGLN1, HOXB13, KIT, MITF, PDGFRA, POLD1, and POLE (sequencing only); EPCAM and GREM1 (deletion/duplication only).    05/26/2021 Definitive Surgery   FINAL MICROSCOPIC DIAGNOSIS:   A. BREAST, LEFT, LUMPECTOMY:  - Invasive ductal carcinoma, 1.5 cm, grade 2  - Ductal carcinoma in situ, intermediate grade  - Resection margins are negative for carcinoma; closest is the posterior margin at 0.3 cm  - Patchy atypical ductal hyperplasia  - Fibrocystic change with usual ductal hyperplasia, apocrine metaplasia and calcifications  - Biopsy site changes  - See oncology table    05/26/2021 Cancer Staging   Staging form: Breast, AJCC 8th Edition - Pathologic stage from 05/26/2021: Stage Unknown (pT1c, pNX, cM0, G2, ER+, PR+, HER2-) - Signed by Malachy Mood, MD on 06/30/2021 Stage prefix: Initial diagnosis Multigene prognostic tests performed: None Histologic grading system: 3 grade system Residual tumor (R): R0 - None   10/04/2021 Survivorship   SCP delivered in person by Santiago Glad, NP   04/12/2023 Imaging   Bone density test Assessment: By the [facility] Criteria for diagnosis based on bone density, this patient has Low Bone Density  FRAX 10-year fracture risk calculator: 13.5 % for any major fracture and 3.6 % for hip fracture. Pharmacologic therapy is recommended if 10 year fracture risk is >20% for any major osteoporotic fracture or >3% for hip fracture.   L2,L3  vertebrae had to be excluded  from analysis          REVIEW OF SYSTEMS:   Constitutional: Denies fevers, chills or abnormal weight loss Eyes: Denies blurriness of vision Ears, nose, mouth, throat, and face: Denies mucositis or sore throat Respiratory: Denies cough, dyspnea or wheezes Cardiovascular: Denies palpitation, chest discomfort or lower extremity swelling Gastrointestinal:  Denies nausea, heartburn or change in bowel habits Skin: Denies abnormal skin rashes Lymphatics: Denies new lymphadenopathy or easy bruising Neurological:Denies numbness, tingling or new weaknesses Behavioral/Psych: Mood is stable, no new changes  All other systems were reviewed with the patient and are negative.   VITALS:   Today's Vitals   07/12/23 1000 07/12/23 1028 07/12/23 1134  BP:  (!) 149/57 138/68  Pulse:  82   Resp:  17   Temp:  97.7 F (36.5 C)   TempSrc:  Oral   SpO2:  95%   Weight:  153 lb 6.4 oz (69.6 kg)   PainSc:  0-No pain     Body mass index is 29.96 kg/m.   Wt Readings from Last 3 Encounters:  07/12/23 153 lb 6.4 oz (69.6 kg)  06/12/23 150 lb (68 kg)  05/25/23 150 lb (68 kg)    Body mass index is 29.96 kg/m.  Performance status (ECOG): 0 - Asymptomatic  PHYSICAL EXAM:   GENERAL:alert, no distress and comfortable SKIN: skin color, texture, turgor are normal, no rashes or significant lesions EYES: normal, Conjunctiva are pink and non-injected, sclera clear OROPHARYNX:no exudate, no erythema and lips, buccal mucosa, and tongue normal  NECK: supple, thyroid normal size, non-tender, without nodularity LYMPH:  no palpable lymphadenopathy in the cervical, axillary or inguinal LUNGS: clear to auscultation and percussion with normal breathing effort HEART: regular rate & rhythm and no murmurs and no lower extremity edema ABDOMEN:abdomen soft, non-tender and normal bowel sounds Musculoskeletal:no cyanosis of digits and no clubbing  NEURO: alert & oriented x 3 with fluent speech, no focal motor/sensory  deficits BREAST: RT breast exam no palpable mass without tenderness or right axillary lymphadenopathy.  LT breast lumpectomy scar tissue intact. There is no palpable mass and there is no left axillary lymphadenopathy. Breast exam benign.    LABORATORY DATA:  I have reviewed the data as listed    Component Value Date/Time   NA 143 07/12/2023 0951   NA 144 06/19/2022 1435   NA 143 01/20/2014 1453   K 4.1 07/12/2023 0951   K 4.3 01/20/2014 1453   CL 108 07/12/2023 0951   CL 106 12/30/2012 1511   CO2 29 07/12/2023 0951   CO2 27 01/20/2014 1453   GLUCOSE 200 (H) 07/12/2023 0951   GLUCOSE 150 (H) 01/20/2014 1453   GLUCOSE 104 (H) 12/30/2012 1511   BUN 17 07/12/2023 0951   BUN 13 06/19/2022 1435   BUN 13.8 01/20/2014 1453   CREATININE 0.72 07/12/2023 0951   CREATININE 0.64 03/16/2020 0957   CREATININE 0.8 01/20/2014 1453   CALCIUM 9.0 07/12/2023 0951   CALCIUM 9.5 01/20/2014 1453   PROT 6.7 07/12/2023 0951   PROT 6.5 01/20/2014 1453   ALBUMIN 4.1 07/12/2023 0951   ALBUMIN 3.6 01/20/2014 1453   AST 16 07/12/2023 0951   AST 19 01/20/2014 1453   ALT 11 07/12/2023 0951   ALT 20 01/20/2014 1453   ALKPHOS 59 07/12/2023 0951   ALKPHOS 80 01/20/2014 1453   BILITOT 0.8 07/12/2023 0951   BILITOT 0.82 01/20/2014 1453   GFRNONAA >60 07/12/2023 0951   GFRNONAA >89 06/11/2015 1101   GFRAA >60 09/09/2019 1030   GFRAA >89 06/11/2015 1101    Lab Results  Component Value Date   WBC 5.7 07/12/2023   NEUTROABS 3.9 07/12/2023   HGB 14.0 07/12/2023   HCT 43.3 07/12/2023   MCV 94.3 07/12/2023   PLT 167 07/12/2023

## 2023-07-12 ENCOUNTER — Inpatient Hospital Stay: Payer: Medicare HMO | Attending: Nurse Practitioner

## 2023-07-12 ENCOUNTER — Inpatient Hospital Stay: Payer: Medicare HMO | Admitting: Nurse Practitioner

## 2023-07-12 VITALS — BP 138/68 | HR 82 | Temp 97.7°F | Resp 17 | Wt 153.4 lb

## 2023-07-12 DIAGNOSIS — R9230 Dense breasts, unspecified: Secondary | ICD-10-CM

## 2023-07-12 DIAGNOSIS — Z79899 Other long term (current) drug therapy: Secondary | ICD-10-CM | POA: Diagnosis not present

## 2023-07-12 DIAGNOSIS — M8580 Other specified disorders of bone density and structure, unspecified site: Secondary | ICD-10-CM | POA: Insufficient documentation

## 2023-07-12 DIAGNOSIS — Z170 Estrogen receptor positive status [ER+]: Secondary | ICD-10-CM | POA: Insufficient documentation

## 2023-07-12 DIAGNOSIS — C50112 Malignant neoplasm of central portion of left female breast: Secondary | ICD-10-CM

## 2023-07-12 LAB — CBC WITH DIFFERENTIAL (CANCER CENTER ONLY)
Abs Immature Granulocytes: 0.01 10*3/uL (ref 0.00–0.07)
Basophils Absolute: 0.1 10*3/uL (ref 0.0–0.1)
Basophils Relative: 1 %
Eosinophils Absolute: 0.1 10*3/uL (ref 0.0–0.5)
Eosinophils Relative: 1 %
HCT: 43.3 % (ref 36.0–46.0)
Hemoglobin: 14 g/dL (ref 12.0–15.0)
Immature Granulocytes: 0 %
Lymphocytes Relative: 19 %
Lymphs Abs: 1.1 10*3/uL (ref 0.7–4.0)
MCH: 30.5 pg (ref 26.0–34.0)
MCHC: 32.3 g/dL (ref 30.0–36.0)
MCV: 94.3 fL (ref 80.0–100.0)
Monocytes Absolute: 0.6 10*3/uL (ref 0.1–1.0)
Monocytes Relative: 11 %
Neutro Abs: 3.9 10*3/uL (ref 1.7–7.7)
Neutrophils Relative %: 68 %
Platelet Count: 167 10*3/uL (ref 150–400)
RBC: 4.59 MIL/uL (ref 3.87–5.11)
RDW: 13.7 % (ref 11.5–15.5)
WBC Count: 5.7 10*3/uL (ref 4.0–10.5)
nRBC: 0 % (ref 0.0–0.2)

## 2023-07-12 LAB — CMP (CANCER CENTER ONLY)
ALT: 11 U/L (ref 0–44)
AST: 16 U/L (ref 15–41)
Albumin: 4.1 g/dL (ref 3.5–5.0)
Alkaline Phosphatase: 59 U/L (ref 38–126)
Anion gap: 6 (ref 5–15)
BUN: 17 mg/dL (ref 8–23)
CO2: 29 mmol/L (ref 22–32)
Calcium: 9 mg/dL (ref 8.9–10.3)
Chloride: 108 mmol/L (ref 98–111)
Creatinine: 0.72 mg/dL (ref 0.44–1.00)
GFR, Estimated: >60 mL/min (ref >60)
Glucose, Bld: 200 mg/dL — ABNORMAL HIGH (ref 70–99)
Potassium: 4.1 mmol/L (ref 3.5–5.1)
Sodium: 143 mmol/L (ref 135–145)
Total Bilirubin: 0.8 mg/dL (ref <1.2)
Total Protein: 6.7 g/dL (ref 6.5–8.1)

## 2023-07-13 ENCOUNTER — Other Ambulatory Visit: Payer: Self-pay

## 2023-07-13 ENCOUNTER — Telehealth: Payer: Self-pay

## 2023-07-13 NOTE — Telephone Encounter (Signed)
Pt called with a question as to when her dx mammogram is due.  Pt stated she called the breast center to get mammogram scheduled and was told the mammogram is not due until March 2025.  Pt stated when she was in clinic with Vincent Gros, NP on 07/12/2023 she was told her mammogram was overdue because she's due mammograms every 6 months.  Reviewed provider's office note and pt is due mammogram annually and is to f/u w/provider every 6 months.  Confirmed information with provider.  Informed pt that her dx mammogram is due annually which would be in March 2025 and her f/u appts with the provider are every 6 months.  Pt verbalized understanding and had no further questions or concerns.

## 2023-07-15 ENCOUNTER — Encounter: Payer: Self-pay | Admitting: Nurse Practitioner

## 2023-07-31 ENCOUNTER — Encounter: Payer: Self-pay | Admitting: Family Medicine

## 2023-07-31 DIAGNOSIS — Z961 Presence of intraocular lens: Secondary | ICD-10-CM | POA: Diagnosis not present

## 2023-07-31 DIAGNOSIS — E119 Type 2 diabetes mellitus without complications: Secondary | ICD-10-CM | POA: Diagnosis not present

## 2023-07-31 DIAGNOSIS — H52203 Unspecified astigmatism, bilateral: Secondary | ICD-10-CM | POA: Diagnosis not present

## 2023-07-31 LAB — HM DIABETES EYE EXAM

## 2023-08-05 ENCOUNTER — Other Ambulatory Visit: Payer: Self-pay | Admitting: Internal Medicine

## 2023-08-07 ENCOUNTER — Other Ambulatory Visit: Payer: Self-pay | Admitting: Internal Medicine

## 2023-08-07 DIAGNOSIS — E1169 Type 2 diabetes mellitus with other specified complication: Secondary | ICD-10-CM

## 2023-08-14 ENCOUNTER — Telehealth: Payer: Self-pay

## 2023-08-14 ENCOUNTER — Ambulatory Visit (INDEPENDENT_AMBULATORY_CARE_PROVIDER_SITE_OTHER): Payer: Medicare HMO

## 2023-08-14 DIAGNOSIS — Z7901 Long term (current) use of anticoagulants: Secondary | ICD-10-CM

## 2023-08-14 LAB — POCT INR: INR: 2.3 (ref 2.0–3.0)

## 2023-08-14 NOTE — Telephone Encounter (Signed)
Please call her and let her know-the RSV vaccine is approximately 80% effective against preventing serious illness or hospitalization and I would recommend it.  There is a possibility of side effects from the vaccine-not feeling well for a day or 2.

## 2023-08-14 NOTE — Progress Notes (Signed)
Continue 1 tablet daily except take 1 1/2 tablets on Fridays and recheck in 7 weeks at apt with PCP on 1/29.

## 2023-08-14 NOTE — Telephone Encounter (Signed)
Pt in today for INR check and inquired if she should get a RSV vaccine. Advised a msg would be sent to PCP to inquire. She asked that someone call instead of send a mychart msg because her computer is not working.   She will also need her INR checked at her PCP apt for her physical on 1/29. I have added a note to add a lab INR. I will f/u with pt via phone with dosing instructions when the result returns that day.

## 2023-08-14 NOTE — Telephone Encounter (Signed)
Spoke with patient today and recommendation given.

## 2023-08-14 NOTE — Patient Instructions (Addendum)
Pre visit review using our clinic review tool, if applicable. No additional management support is needed unless otherwise documented below in the visit note.  Continue 1 tablet daily except take 1 1/2 tablets on Fridays and recheck in 7 weeks at apt with PCP on 1/29.

## 2023-08-23 DIAGNOSIS — L821 Other seborrheic keratosis: Secondary | ICD-10-CM | POA: Diagnosis not present

## 2023-08-23 DIAGNOSIS — Z85828 Personal history of other malignant neoplasm of skin: Secondary | ICD-10-CM | POA: Diagnosis not present

## 2023-08-23 DIAGNOSIS — D1801 Hemangioma of skin and subcutaneous tissue: Secondary | ICD-10-CM | POA: Diagnosis not present

## 2023-08-23 DIAGNOSIS — L720 Epidermal cyst: Secondary | ICD-10-CM | POA: Diagnosis not present

## 2023-08-23 DIAGNOSIS — L812 Freckles: Secondary | ICD-10-CM | POA: Diagnosis not present

## 2023-08-23 DIAGNOSIS — L570 Actinic keratosis: Secondary | ICD-10-CM | POA: Diagnosis not present

## 2023-08-23 DIAGNOSIS — L308 Other specified dermatitis: Secondary | ICD-10-CM | POA: Diagnosis not present

## 2023-09-10 ENCOUNTER — Ambulatory Visit: Payer: Medicare HMO | Admitting: Gastroenterology

## 2023-10-02 ENCOUNTER — Ambulatory Visit (HOSPITAL_COMMUNITY)
Admission: RE | Admit: 2023-10-02 | Discharge: 2023-10-02 | Disposition: A | Discharge status: Home / Self Care | Payer: Medicare HMO | Source: Ambulatory Visit | Attending: Cardiology | Admitting: Cardiology

## 2023-10-02 ENCOUNTER — Encounter: Payer: Self-pay | Admitting: Internal Medicine

## 2023-10-02 ENCOUNTER — Encounter (HOSPITAL_COMMUNITY): Payer: Self-pay | Admitting: Cardiology

## 2023-10-02 VITALS — BP 112/60 | HR 69 | Wt 148.4 lb

## 2023-10-02 DIAGNOSIS — I428 Other cardiomyopathies: Secondary | ICD-10-CM | POA: Insufficient documentation

## 2023-10-02 DIAGNOSIS — I442 Atrioventricular block, complete: Secondary | ICD-10-CM | POA: Insufficient documentation

## 2023-10-02 DIAGNOSIS — I252 Old myocardial infarction: Secondary | ICD-10-CM | POA: Insufficient documentation

## 2023-10-02 DIAGNOSIS — I2510 Atherosclerotic heart disease of native coronary artery without angina pectoris: Secondary | ICD-10-CM | POA: Insufficient documentation

## 2023-10-02 DIAGNOSIS — I272 Pulmonary hypertension, unspecified: Secondary | ICD-10-CM | POA: Insufficient documentation

## 2023-10-02 DIAGNOSIS — I4821 Permanent atrial fibrillation: Secondary | ICD-10-CM | POA: Insufficient documentation

## 2023-10-02 DIAGNOSIS — Z950 Presence of cardiac pacemaker: Secondary | ICD-10-CM | POA: Diagnosis not present

## 2023-10-02 DIAGNOSIS — Z952 Presence of prosthetic heart valve: Secondary | ICD-10-CM | POA: Diagnosis not present

## 2023-10-02 DIAGNOSIS — Z79899 Other long term (current) drug therapy: Secondary | ICD-10-CM | POA: Diagnosis not present

## 2023-10-02 DIAGNOSIS — E119 Type 2 diabetes mellitus without complications: Secondary | ICD-10-CM | POA: Insufficient documentation

## 2023-10-02 DIAGNOSIS — I5022 Chronic systolic (congestive) heart failure: Secondary | ICD-10-CM | POA: Diagnosis not present

## 2023-10-02 DIAGNOSIS — Z7901 Long term (current) use of anticoagulants: Secondary | ICD-10-CM | POA: Diagnosis not present

## 2023-10-02 LAB — BASIC METABOLIC PANEL
Anion gap: 5 (ref 5–15)
BUN: 14 mg/dL (ref 8–23)
CO2: 27 mmol/L (ref 22–32)
Calcium: 9 mg/dL (ref 8.9–10.3)
Chloride: 111 mmol/L (ref 98–111)
Creatinine, Ser: 0.68 mg/dL (ref 0.44–1.00)
GFR, Estimated: >60 mL/min (ref >60)
Glucose, Bld: 143 mg/dL — ABNORMAL HIGH (ref 70–99)
Potassium: 3.9 mmol/L (ref 3.5–5.1)
Sodium: 143 mmol/L (ref 135–145)

## 2023-10-02 LAB — CBC
HCT: 44.4 % (ref 36.0–46.0)
Hemoglobin: 14.5 g/dL (ref 12.0–15.0)
MCH: 30.5 pg (ref 26.0–34.0)
MCHC: 32.7 g/dL (ref 30.0–36.0)
MCV: 93.5 fL (ref 80.0–100.0)
Platelets: 179 10*3/uL (ref 150–400)
RBC: 4.75 MIL/uL (ref 3.87–5.11)
RDW: 13.5 % (ref 11.5–15.5)
WBC: 4.9 10*3/uL (ref 4.0–10.5)
nRBC: 0 % (ref 0.0–0.2)

## 2023-10-02 LAB — LIPID PANEL
Cholesterol: 120 mg/dL (ref 0–200)
HDL: 50 mg/dL (ref >40)
LDL Cholesterol: 53 mg/dL (ref 0–99)
Total CHOL/HDL Ratio: 2.4 {ratio}
Triglycerides: 85 mg/dL (ref <150)
VLDL: 17 mg/dL (ref 0–40)

## 2023-10-02 LAB — BRAIN NATRIURETIC PEPTIDE: B Natriuretic Peptide: 114.1 pg/mL — ABNORMAL HIGH (ref 0.0–100.0)

## 2023-10-02 NOTE — Patient Instructions (Signed)
There has been no changes to your medications.  Labs done today, your results will be available in MyChart, we will contact you for abnormal readings.  Your physician has requested that you have an echocardiogram. Echocardiography is a painless test that uses sound waves to create images of your heart. It provides your doctor with information about the size and shape of your heart and how well your heart's chambers and valves are working. This procedure takes approximately one hour. There are no restrictions for this procedure. Please do NOT wear cologne, perfume, aftershave, or lotions (deodorant is allowed). Please arrive 15 minutes prior to your appointment time.  Please note: We ask at that you not bring children with you during ultrasound (echo/ vascular) testing. Due to room size and safety concerns, children are not allowed in the ultrasound rooms during exams. Our front office staff cannot provide observation of children in our lobby area while testing is being conducted. An adult accompanying a patient to their appointment will only be allowed in the ultrasound room at the discretion of the ultrasound technician under special circumstances. We apologize for any inconvenience.  Your physician recommends that you schedule a follow-up appointment in: 4 months.  If you have any questions or concerns before your next appointment please send [name] a message through [place] or call our office at [phone].    TO LEAVE A MESSAGE FOR THE NURSE SELECT OPTION 2, PLEASE LEAVE A MESSAGE INCLUDING: YOUR NAME DATE OF BIRTH CALL BACK NUMBER REASON FOR CALL**this is important as we prioritize the call backs  YOU WILL RECEIVE A CALL BACK THE SAME DAY AS LONG AS YOU CALL BEFORE 4:00 PM  At the [facility], you and your health needs are our priority. As part of our continuing mission to provide you with exceptional heart care, we have created designated Provider Care Teams. These Care  Teams include your primary Cardiologist (physician) and Advanced Practice Providers (APPs- Physician Assistants and Nurse Practitioners) who all work together to provide you with the care you need, when you need it.   You may see any of the following providers on your designated Care Team at your next follow up: Dr Arvilla Meres Dr Marca Ancona Dr. Dorthula Nettles Dr. Clearnce Hasten Amy Filbert Schilder, NP Robbie Lis, [place] [facility] Gordon, [place] Brynda Peon, NP [place] Lee, NP Karle Plumber, PharmD   Please be sure to bring in all your medications bottles to every appointment.    Thank you for choosing [facility]

## 2023-10-02 NOTE — Progress Notes (Addendum)
Advanced Heart Failure Clinic Note    Primary Care: Dr. Cheryll Cockayne HF Cardiologist: Dr. Shirlee Latch  Chief Complaint: Shortness of breath  HPI: Anita Duke is a 83 y.o. with a past medical history of pulmonary HTN, mitral regurgitation s/p mitral valve repair, MAZE procedure at the [facility] in 2001, at that time she also had a LIMA to LAD graft placed. Also with history of permanent Afib on warfarin s/p AVN ablation. At one time she was on Tikosyn which was stopped for QT prolongation.  She has chronic systolic CHF (EF 16% in 12/2014).  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 Last cath in 2014 with atretic LIMA to LAD but nonobstructive disease in LAD.   Echo in 10/18 showed EF improved to 55-60% with mild to moderate RV dysfunction.   Echo in 7/20 showed EF 50% with basal inferior and inferolateral akinesis, mildly decreased RV systolic function, s/p MV repair with trivial MR, no MS, normal IVC.  Echo in 1/22 showed EF 45-50% with basal inferior and basal inferolateral akinesis, mildly decreased RV systolic function, s/p MV repair with trivial MR and mean gradient 4 mmHg.   Patient developed breast cancer in 2022 and is status post lumpectomy.  Echo 12/22 showed EF 45-50%, diffuse hypokinesis, mildly decreased RV function, repaired mitral valve with trivial MR no MS, normal IVC.   Patient was admitted in 3/24 with metapneumovirus PNA.  HS-troponin was found to be elevated to 3029 and she had some chest pain, suspected NSTEMI.  Coronary CTA was done, showing totally occluded LCx with collaterals, atretic LIMA but patent LAD.  There was a possible small ulceration in the ascending aorta.  Echo in 3/24 showed EF 50-55%, akinesis of the basal -mid inferior and inferolateral walls, moderate RV dysfunction, s/p MV repair with minimal MR.   Today she returns for HF follow up. Weight down 2 lbs.  She walks her dog for exercise 4 times/day.  No dyspnea walking on flat ground but feels like  her stamina is a little worse since last year.  She is mildly short of breath walking up stairs. No chest pain.  No lightheadedness.   ECG (personally reviewed): atrial fibrillation, BiV pacing  Labs (5/18): K 4.7, creatinine 0.62, LDL 71, HDL 61 Labs (10/18): K 3.5, creatinine 0.59, TSH normal Labs (7/20): K 3.9, creatinine 0.69, hgb 15.1, LDL 37, TSH normal Labs (7/21): K 3.8, creatinine 0.64, LDL 52, HDL 64 Labs (1/22): K 4.1, creatinine 0.68, LDL 40 Labs (7/22): LDL 47 Labs (10/22): K 3.6, creatinine 0.71 Labs (1/23): LDL 60, HDL 59 Labs (4/23): K 4.0, creatinine 0.61, hgb 13.2 Labs (11/23): K 4.3, creatinine 0.64 Labs (3/24): K 4.3, creatinine 0.72 Labs (5/24): K 3.8, creatinine 0.61, LDL 63 Labs (7/24): creatinine 0.70 Labs (11/24): K 4.4, creatinine 0.72  ROS: All systems reviewed and negative except as per HPI.  PMH: 1. Type II diabetes. 2. COPD 3. Depression 4. H/o colon cancer 5. CAD: LIMA-LAD in 4/01 with MV repair.   - LHC (8/14): LIMA-LAD atretic, 50-60% mid LAD stenosis.  - Coronary CTA (3/24): totally occluded LCx with collaterals, atretic LIMA but patent LAD with 50-69% proximal stenosis.  6. Mitral regurgitation: S/p MV repair at [facility] in 4/01.  She also had LAA ligation and MAZE. Echo in 7/20 with trivial MR and no MS.  7. 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   - St Jude CRT-P device.  - Echo (4/16): EF 40%, mild LV dilation,  normal RV size with mild to moderately decreased systolic function, s/p mitral valve repair with mild MR, no significant stenosis.  - Echo (10/18): EF 55-60%, severe LAE, RV with mild to moderate systolic dysfunction, mild-moderate TR, PASP 35 mmHg.  - Echo (7/20): EF 50% with basal inferior and inferolateral akinesis, mildly decreased RV systolic function, s/p MV repair with trivial MR, no MS, normal IVC.  - Echo (1/22): EF 45-50% with basal inferior and basal inferolateral akinesis, mildly decreased RV  systolic function, s/p MV repair with trivial MR and mean gradient 4 mmHg. - Echo (12/22): EF 45-50%, diffuse hypokinesis, mildly decreased RV function, repaired mitral valve with trivial MR no MS, normal IVC.  - Echo (3/24): EF 50-55%, akinesis of the basal -mid inferior and inferolateral walls, moderate RV dysfunction, s/p MV repair with minimal MR.  8. Permanent atrial fibrillation: S/p MAZE in 4/01.  S/p AV nodal ablation in 9/13.   9. PFTs (1/19): No significant obstruction or restriction in the lungs.  10. Breast cancer: S/p lumpectomy in 2022.   ROS: All systems reviewed and negative except as per HPI.   Current Outpatient Medications  Medication Sig Dispense Refill   amoxicillin (AMOXIL) 500 MG capsule TAKE 4 CAPSULES PRIOR TO DENTAL APPOINTMENTS AS DIRECTED 20 capsule 0   atorvastatin (LIPITOR) 40 MG tablet TAKE 1 TABLET DAILY 90 tablet 3   carvedilol (COREG) 12.5 MG tablet TAKE 1 TABLET TWICE DAILY  WITH MEALS 180 tablet 3   cetirizine (ZYRTEC) 10 MG tablet Take 10 mg by mouth daily.     cholecalciferol (VITAMIN D) 1000 UNITS tablet Take 1,000 Units by mouth daily.      cyanocobalamin (VITAMIN B12) 500 MCG tablet Take 500 mcg by mouth daily.     fish oil-omega-3 fatty acids 1000 MG capsule Take 1 g by mouth 2 (two) times daily.     JARDIANCE 25 MG TABS tablet TAKE 1 TABLET DAILY 30 tablet 11   Polyethyl Glycol-Propyl Glycol (SYSTANE) 0.4-0.3 % SOLN Place 1 drop into both eyes daily.     RYBELSUS 3 MG TABS TAKE 1 TABLET DAILY 30 tablet 2   sacubitril-valsartan (ENTRESTO) 24-26 MG TAKE 1 TABLET TWICE A DAY 60 tablet 11   sodium chloride (OCEAN) 0.65 % SOLN nasal spray Place 1 spray into both nostrils 2 (two) times daily.     spironolactone (ALDACTONE) 25 MG tablet TAKE 1 TABLET DAILY 90 tablet 3   tamoxifen (NOLVADEX) 20 MG tablet Take 1 tablet (20 mg total) by mouth daily. 90 tablet 2   venlafaxine XR (EFFEXOR-XR) 37.5 MG 24 hr capsule TAKE 1 CAPSULE DAILY 90 capsule 2   warfarin  (JANTOVEN) 2.5 MG tablet TAKE 1 TABLET DAILY EXCEPT TAKE 1 1/2 TABLETS ON FRIDAYS OR AS  DIRECTED BY ANTICOAGULATION CLINIC 105 tablet 3   No current facility-administered medications for this encounter.   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[employer]  [employer]    Employer: [employer]    Employer: [employer]   Tobacco Use   Smoking status: Never   Smokeless tobacco: Never  Vaping Use   Vaping status: Never Used  Substance and Sexual Activity   Alcohol use: No   Drug use: No   Sexual activity: Not Currently    Birth control/protection: Surgical  Other Topics Concern   Not on file  Social History Narrative   No caffeine   Right handed    Lives alone in a retired home Office manager)   Has a Nurse, mental health   Social Drivers of Corporate investment banker Strain: Low Risk  (06/12/2023)   Overall Financial Resource Strain (CARDIA)    Difficulty of Paying Living Expenses: Not hard at all  Food Insecurity: No Food Insecurity (06/12/2023)   Hunger Vital Sign    Worried About Running Out of Food in the Last Year: Never true    Ran Out of Food in the Last Year: Never true  Transportation Needs: No Transportation Needs (06/12/2023)   PRAPARE - Administrator, Civil Service (Medical): No    Lack of Transportation (Non-Medical): No  Physical Activity: Sufficiently Active (06/12/2023)   Exercise Vital Sign    Days of Exercise per Week: 7 days    Minutes of Exercise per Session:  30 min  Stress: No Stress Concern Present (06/12/2023)   [employer] of Occupational Health - Occupational Stress Questionnaire    Feeling of Stress : Not at all  Social Connections: Moderately Isolated (06/12/2023)   Social Connection and Isolation Panel [NHANES]    Frequency of Communication with Friends and Family: More than three times a week    Frequency of Social Gatherings with Friends and Family: More than three times a week    Attends Religious Services: More than 4 times per year    Active Member of [employer] or Organizations: No    Attends Banker Meetings: Never    Marital Status: Widowed  Intimate Partner Violence: Patient Unable To Answer (06/12/2023)   Humiliation, Afraid, Rape, and Kick questionnaire    Fear of Current or Ex-Partner: Patient unable to answer    Emotionally Abused: Patient unable to answer    Physically Abused: Patient unable to answer    Sexually Abused: Patient unable to answer   Family History  Problem Relation Age of Onset   Diabetes Mother    Kidney disease Mother    Thyroid disease Mother    Alzheimer's disease Mother        Symptom onset in late 44s; confirmed via autopsy   Depression Mother    Heart disease Father    Diabetes Father    Heart disease Sister        x 2   Colon cancer Maternal Aunt        dx 27s; mother's paternal half sister   Colon cancer Maternal Aunt        dx late 46s   Heart disease Paternal Uncle        x 6   Cancer Maternal Grandfather        unknown type; dx unknown age   Diabetes Paternal Grandmother    Diabetes Paternal Grandfather    Colon cancer Cousin    Brain cancer Nephew 52   Colon polyps Neg Hx    Adrenal disorder Neg Hx    Esophageal cancer Neg Hx    Rectal cancer Neg Hx    Stomach  cancer Neg Hx    BP 112/60   Pulse 69   Wt 67.3 kg (148 lb 6.4 oz)   SpO2 95%   BMI 28.98 kg/m   Wt Readings from Last 3 Encounters:  10/02/23 67.3 kg (148 lb 6.4 oz)  07/12/23 69.6 kg (153 lb 6.4  oz)  06/12/23 68 kg (150 lb)   PHYSICAL EXAM: General: NAD Neck: No JVD, no thyromegaly or thyroid nodule.  Lungs: Clear to auscultation bilaterally with normal respiratory effort. CV: Nondisplaced PMI.  Heart regular S1/S2, no S3/S4, no murmur.  No peripheral edema.  No carotid bruit.  Normal pedal pulses.  Abdomen: Soft, nontender, no hepatosplenomegaly, no distention.  Skin: Intact without lesions or rashes.  Neurologic: Alert and oriented x 3.  Psych: Normal affect. Extremities: No clubbing or cyanosis.  HEENT: Normal.   ASSESSMENT & PLAN: 1. Chronic systolic CHF: EF 09% in 2016. NICM most likely.  She has LIMA-LAD that is atretic, but no flow limiting disease in the LAD.  Cardiomyopathy may be due to RV pacing after AV nodal ablation in 9/13. She now has St Jude CRT-P device and EF was up to 55-60% on 10/18 echo. Echo in 7/20 showed EF about 50%.  Echo in 1/22 and in 12/22 showed stable EF 45-50%.  Echo in 3/24 showed EF 50-55%, akinesis of the basal -mid inferior and inferolateral walls, moderate RV dysfunction, s/p MV repair with minimal MR.  NYHA class II, weight down, not volume overloaded on exam.  - I will arrange for repeat echo to follow LV function.  - She does not need regular Lasix.  - Continue Coreg 12.5 mg bid.  - Continue Jardiance.  - Continue Entresto 24/26 bid, will increase if EF is lower on 3/25 echo.  - Continue spironolactone 25 mg daily.   - Check BMET/BNP today.  2. CAD: LIMA-LAD at time of MV surgery in 4/01, but LIMA-LAD atretic on 8/14 cath.  However, at that time her LAD had moderate stenosis that did not appear flow-limiting (50-60%).  Coronary CTA in 3/24 showed totally occluded LCx with collaterals, atretic LIMA but patent LAD with 50-69% proximal stenosis.  NSTEMI in 3/24 but coronary CTA showed no culprit lesion.  The occluded LCx was old as evidenced by collaterals and also prior echoes have shown LCx territory wall motion abnormalities.  No chest pain.   - Continue atorvastatin 40 mg daily. Check lipids today.  - No ASA given use of warfarin 3. Complete heart block: She had an AV nodal ablation.  Now s/p CRT-P.   4. Permanent atrial fibrillation: Now s/p AV nodal ablation and BiV pacing. - Continue warfarin for anticoagulation. CBC today.  - She could swich anticoagulation to Eliquis, data suggests ok to use DOAC with valve repair, bioprosthetic valve.  However, she has wanted to continue warfarin as she has been on it stably x years.  5. DM: Continue Jardiance.  6. S/p MV repair: Stable mitral valve repair on 3/24 echo.   - Repeat echo in 3/25.  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  - CTA chest showed aortic abnormality is scarring from prior cardiopulmonary bypass cannulation, no penetrating aortic ulceration.  Follow up in 4 months with APP  Marca Ancona, MD 10/02/23

## 2023-10-02 NOTE — Progress Notes (Unsigned)
Subjective:    Patient ID: Anita Duke, female    DOB: 11/25/1940, 83 y.o.   MRN: [id number]      HPI Anita Duke is here for a Physical exam and her chronic medical problems.   Overall doing well.  She feels for her age she is doing well   Medications and allergies reviewed with patient and updated if appropriate.  Current Outpatient Medications on File Prior to Visit  Medication Sig Dispense Refill   amoxicillin (AMOXIL) 500 MG capsule TAKE 4 CAPSULES PRIOR TO DENTAL APPOINTMENTS AS DIRECTED 20 capsule 0   atorvastatin (LIPITOR) 40 MG tablet TAKE 1 TABLET DAILY 90 tablet 3   carvedilol (COREG) 12.5 MG tablet TAKE 1 TABLET TWICE DAILY  WITH MEALS 180 tablet 3   cetirizine (ZYRTEC) 10 MG tablet Take 10 mg by mouth daily.     cholecalciferol (VITAMIN D) 1000 UNITS tablet Take 1,000 Units by mouth daily.      cyanocobalamin (VITAMIN B12) 500 MCG tablet Take 500 mcg by mouth daily.     fish oil-omega-3 fatty acids 1000 MG capsule Take 1 g by mouth 2 (two) times daily.     JARDIANCE 25 MG TABS tablet TAKE 1 TABLET DAILY 30 tablet 11   Polyethyl Glycol-Propyl Glycol (SYSTANE) 0.4-0.3 % SOLN Place 1 drop into both eyes daily.     RYBELSUS 3 MG TABS TAKE 1 TABLET DAILY 30 tablet 2   sacubitril-valsartan (ENTRESTO) 24-26 MG TAKE 1 TABLET TWICE A DAY 60 tablet 11   sodium chloride (OCEAN) 0.65 % SOLN nasal spray Place 1 spray into both nostrils 2 (two) times daily.     spironolactone (ALDACTONE) 25 MG tablet TAKE 1 TABLET DAILY 90 tablet 3   tamoxifen (NOLVADEX) 20 MG tablet Take 1 tablet (20 mg total) by mouth daily. 90 tablet 2   venlafaxine XR (EFFEXOR-XR) 37.5 MG 24 hr capsule TAKE 1 CAPSULE DAILY 90 capsule 2   warfarin (JANTOVEN) 2.5 MG tablet TAKE 1 TABLET DAILY EXCEPT TAKE 1 1/2 TABLETS ON FRIDAYS OR AS  DIRECTED BY ANTICOAGULATION CLINIC 105 tablet 3   No current facility-administered medications on file prior to visit.    Review of Systems  Constitutional:  Negative for  fever.  Eyes:  Negative for visual disturbance.  Respiratory:  Negative for cough, shortness of breath and wheezing.   Cardiovascular:  Negative for chest pain, palpitations and leg swelling.  Gastrointestinal:  Negative for abdominal pain, blood in stool, constipation and diarrhea.       No gerd  Genitourinary:  Negative for dysuria.  Musculoskeletal:  Positive for arthralgias (mild). Negative for back pain.  Skin:  Negative for rash.  Neurological:  Negative for light-headedness and headaches.  Psychiatric/Behavioral:  Positive for dysphoric mood (low grade). The patient is not nervous/anxious.        Objective:   Vitals:   10/03/23 0916  BP: 132/70  Pulse: 68  Temp: 98 F (36.7 C)  SpO2: 94%   Filed Weights   10/03/23 0916  Weight: 148 lb (67.1 kg)   Body mass index is 28.9 kg/m.  BP Readings from Last 3 Encounters:  10/03/23 132/70  10/02/23 112/60  07/12/23 138/68    Wt Readings from Last 3 Encounters:  10/03/23 148 lb (67.1 kg)  10/02/23 148 lb 6.4 oz (67.3 kg)  07/12/23 153 lb 6.4 oz (69.6 kg)       Physical Exam Constitutional: She appears well-developed and well-nourished. No distress.  HENT:  Head:  Normocephalic and atraumatic.  Right Ear: External ear normal. Normal ear canal and TM Left Ear: External ear normal.  Normal ear canal and TM Mouth/Throat: Oropharynx is clear and moist.  Eyes: Conjunctivae normal.  Neck: Neck supple. No tracheal deviation present. No thyromegaly present.  No carotid bruit  Cardiovascular: Normal rate, regular rhythm and normal heart sounds.   No murmur heard.  No edema. Pulmonary/Chest: Effort normal and breath sounds normal. No respiratory distress. She has no wheezes. She has no rales.  Breast: deferred   Abdominal: Soft. She exhibits no distension. There is no tenderness.  Lymphadenopathy: She has no cervical adenopathy.  Skin: Skin is warm and dry. She is not diaphoretic.  Psychiatric: She has a normal mood and  affect. Her behavior is normal.     Lab Results  Component Value Date   WBC 4.9 10/02/2023   HGB 14.5 10/02/2023   HCT 44.4 10/02/2023   PLT 179 10/02/2023   GLUCOSE 143 (H) 10/02/2023   CHOL 120 10/02/2023   TRIG 85 10/02/2023   HDL 50 10/02/2023   LDLDIRECT 65.0 01/05/2016   LDLCALC 53 10/02/2023   ALT 11 07/12/2023   AST 16 07/12/2023   NA 143 10/02/2023   K 3.9 10/02/2023   CL 111 10/02/2023   CREATININE 0.68 10/02/2023   BUN 14 10/02/2023   CO2 27 10/02/2023   TSH 0.99 10/02/2022   INR 2.2 (H) 10/03/2023   HGBA1C 6.8 (A) 04/02/2023   HGBA1C 6.8 04/02/2023   HGBA1C 6.8 (A) 04/02/2023   HGBA1C 6.8 04/02/2023   MICROALBUR <0.7 10/02/2022         Assessment & Plan:   Physical exam: Screening blood work  ordered Exercise  walking dog 4 times a day Weight  good - lost a few lbs  Substance abuse  none   Reviewed recommended immunizations.   Health Maintenance  Topic Date Due   Diabetic kidney evaluation - Urine ACR  10/03/2023   FOOT EXAM  10/03/2023   HEMOGLOBIN A1C  10/03/2023   COVID-19 Vaccine (5 - 2024-25 season) 10/19/2023 (Originally 07/24/2023)   DTaP/Tdap/Td (1 - Tdap) 10/02/2024 (Originally 01/11/1960)   Medicare Annual Wellness (AWV)  06/11/2024   OPHTHALMOLOGY EXAM  07/30/2024   Diabetic kidney evaluation - eGFR measurement  10/01/2024   DEXA SCAN  04/08/2025   Pneumonia Vaccine 80+ Years old  Completed   INFLUENZA VACCINE  Completed   Zoster Vaccines- Shingrix  Completed   HPV VACCINES  Aged Out   Colonoscopy  Discontinued          See Problem List for Assessment and Plan of chronic medical problems.

## 2023-10-02 NOTE — Patient Instructions (Addendum)
Blood work was ordered.       Medications changes include :   None    A referral was ordered and someone will call you to schedule an appointment.     Return in about 6 months (around 04/01/2024) for follow up.   Health Maintenance, Female Adopting a healthy lifestyle and getting preventive care are important in promoting health and wellness. Ask your health care provider about: The right schedule for you to have regular tests and exams. Things you can do on your own to prevent diseases and keep yourself healthy. What should I know about diet, weight, and exercise? Eat a healthy diet  Eat a diet that includes plenty of vegetables, fruits, low-fat dairy products, and lean protein. Do not eat a lot of foods that are high in solid fats, added sugars, or sodium. Maintain a healthy weight Body mass index (BMI) is used to identify weight problems. It estimates body fat based on height and weight. Your health care provider can help determine your BMI and help you achieve or maintain a healthy weight. Get regular exercise Get regular exercise. This is one of the most important things you can do for your health. Most adults should: Exercise for at least 150 minutes each week. The exercise should increase your heart rate and make you sweat (moderate-intensity exercise). Do strengthening exercises at least twice a week. This is in addition to the moderate-intensity exercise. Spend less time sitting. Even light physical activity can be beneficial. Watch cholesterol and blood lipids Have your blood tested for lipids and cholesterol at 84 years of age, then have this test every 5 years. Have your cholesterol levels checked more often if: Your lipid or cholesterol levels are high. You are older than 84 years of age. You are at high risk for heart disease. What should I know about cancer screening? Depending on your health history and family history, you may need to have cancer  screening at various ages. This may include screening for: Breast cancer. Cervical cancer. Colorectal cancer. Skin cancer. Lung cancer. What should I know about heart disease, diabetes, and high blood pressure? Blood pressure and heart disease High blood pressure causes heart disease and increases the risk of stroke. This is more likely to develop in people who have high blood pressure readings or are overweight. Have your blood pressure checked: Every 3-5 years if you are 58-66 years of age. Every year if you are 23 years old or older. Diabetes Have regular diabetes screenings. This checks your fasting blood sugar level. Have the screening done: Once every three years after age 35 if you are at a normal weight and have a low risk for diabetes. More often and at a younger age if you are overweight or have a high risk for diabetes. What should I know about preventing infection? Hepatitis B If you have a higher risk for hepatitis B, you should be screened for this virus. Talk with your health care provider to find out if you are at risk for hepatitis B infection. Hepatitis C Testing is recommended for: Everyone born from [birth year] through 1966. Anyone with known risk factors for hepatitis C. Sexually transmitted infections (STIs) Get screened for STIs, including gonorrhea and chlamydia, if: You are sexually active and are younger than 84 years of age. You are older than 84 years of age and your health care provider tells you that you are at risk for this type of infection. Your sexual activity has  changed since you were last screened, and you are at increased risk for chlamydia or gonorrhea. Ask your health care provider if you are at risk. Ask your health care provider about whether you are at high risk for HIV. Your health care provider may recommend a prescription medicine to help prevent HIV infection. If you choose to take medicine to prevent HIV, you should first get tested for HIV. You  should then be tested every 3 months for as long as you are taking the medicine. Pregnancy If you are about to stop having your period (premenopausal) and you may become pregnant, seek counseling before you get pregnant. Take 400 to 800 micrograms (mcg) of folic acid every day if you become pregnant. Ask for birth control (contraception) if you want to prevent pregnancy. Osteoporosis and menopause Osteoporosis is a disease in which the bones lose minerals and strength with aging. This can result in bone fractures. If you are 56 years old or older, or if you are at risk for osteoporosis and fractures, ask your health care provider if you should: Be screened for bone loss. Take a calcium or vitamin D supplement to lower your risk of fractures. Be given hormone replacement therapy (HRT) to treat symptoms of menopause. Follow these instructions at home: Alcohol use Do not drink alcohol if: Your health care provider tells you not to drink. You are pregnant, may be pregnant, or are planning to become pregnant. If you drink alcohol: Limit how much you have to: 0-1 drink a day. Know how much alcohol is in your drink. In the [place] one drink equals one 12 oz bottle of beer (355 mL), one 5 oz glass of wine (148 mL), or one 1 oz glass of hard liquor (44 mL). Lifestyle Do not use any products that contain nicotine or tobacco. These products include cigarettes, chewing tobacco, and vaping devices, such as e-cigarettes. If you need help quitting, ask your health care provider. Do not use street drugs. Do not share needles. Ask your health care provider for help if you need support or information about quitting drugs. General instructions Schedule regular health, dental, and eye exams. Stay current with your vaccines. Tell your health care provider if: You often feel depressed. You have ever been abused or do not feel safe at home. Summary Adopting a healthy lifestyle and getting preventive care are  important in promoting health and wellness. Follow your health care provider's instructions about healthy diet, exercising, and getting tested or screened for diseases. Follow your health care provider's instructions on monitoring your cholesterol and blood pressure. This information is not intended to replace advice given to you by your health care provider. Make sure you discuss any questions you have with your health care provider. Document Revised: 01/10/2021 Document Reviewed: 01/10/2021 Elsevier Patient Education  2024 [employer].

## 2023-10-03 ENCOUNTER — Ambulatory Visit (INDEPENDENT_AMBULATORY_CARE_PROVIDER_SITE_OTHER): Payer: Self-pay

## 2023-10-03 ENCOUNTER — Ambulatory Visit (INDEPENDENT_AMBULATORY_CARE_PROVIDER_SITE_OTHER): Payer: Medicare HMO | Admitting: Internal Medicine

## 2023-10-03 VITALS — BP 132/70 | HR 68 | Temp 98.0°F | Ht 60.0 in | Wt 148.0 lb

## 2023-10-03 DIAGNOSIS — I2510 Atherosclerotic heart disease of native coronary artery without angina pectoris: Secondary | ICD-10-CM | POA: Diagnosis not present

## 2023-10-03 DIAGNOSIS — E1169 Type 2 diabetes mellitus with other specified complication: Secondary | ICD-10-CM | POA: Diagnosis not present

## 2023-10-03 DIAGNOSIS — E559 Vitamin D deficiency, unspecified: Secondary | ICD-10-CM | POA: Diagnosis not present

## 2023-10-03 DIAGNOSIS — M85852 Other specified disorders of bone density and structure, left thigh: Secondary | ICD-10-CM

## 2023-10-03 DIAGNOSIS — E538 Deficiency of other specified B group vitamins: Secondary | ICD-10-CM

## 2023-10-03 DIAGNOSIS — I519 Heart disease, unspecified: Secondary | ICD-10-CM

## 2023-10-03 DIAGNOSIS — I10 Essential (primary) hypertension: Secondary | ICD-10-CM

## 2023-10-03 DIAGNOSIS — F3289 Other specified depressive episodes: Secondary | ICD-10-CM | POA: Diagnosis not present

## 2023-10-03 DIAGNOSIS — Z0000 Encounter for general adult medical examination without abnormal findings: Secondary | ICD-10-CM | POA: Diagnosis not present

## 2023-10-03 DIAGNOSIS — K219 Gastro-esophageal reflux disease without esophagitis: Secondary | ICD-10-CM | POA: Diagnosis not present

## 2023-10-03 DIAGNOSIS — Z7901 Long term (current) use of anticoagulants: Secondary | ICD-10-CM | POA: Diagnosis not present

## 2023-10-03 DIAGNOSIS — I4891 Unspecified atrial fibrillation: Secondary | ICD-10-CM

## 2023-10-03 DIAGNOSIS — J4520 Mild intermittent asthma, uncomplicated: Secondary | ICD-10-CM

## 2023-10-03 DIAGNOSIS — Z7984 Long term (current) use of oral hypoglycemic drugs: Secondary | ICD-10-CM | POA: Diagnosis not present

## 2023-10-03 DIAGNOSIS — E782 Mixed hyperlipidemia: Secondary | ICD-10-CM

## 2023-10-03 LAB — PROTIME-INR
INR: 2.2 {ratio} — ABNORMAL HIGH (ref 0.8–1.0)
Prothrombin Time: 23 s — ABNORMAL HIGH (ref 9.6–13.1)

## 2023-10-03 LAB — MICROALBUMIN / CREATININE URINE RATIO
Creatinine,U: 60 mg/dL
Microalb Creat Ratio: 1.3 mg/g (ref 0.0–30.0)
Microalb, Ur: 0.8 mg/dL (ref 0.0–1.9)

## 2023-10-03 LAB — HEMOGLOBIN A1C: Hgb A1c MFr Bld: 7.9 % — ABNORMAL HIGH (ref 4.6–6.5)

## 2023-10-03 NOTE — Patient Instructions (Addendum)
Pre visit review using our clinic review tool, if applicable. No additional management support is needed unless otherwise documented below in the visit note.  Continue 1 tablet daily except take 1 1/2 tablets on Fridays and recheck in 6 weeks, 3/11.

## 2023-10-03 NOTE — Assessment & Plan Note (Signed)
Chronic Controlled, Stable Continue venlafaxine 37.5 mg daily

## 2023-10-03 NOTE — Progress Notes (Signed)
Pt had apt with PCP today and had a lab INR drawn. Result is back and INR is 2.2 Continue 1 tablet daily except take 1 1/2 tablets on Fridays and recheck in 6 weeks, 3/11. LVM with dosing instructions and to return call to schedule next INR check.

## 2023-10-03 NOTE — Assessment & Plan Note (Signed)
Chronic Blood pressure well controlled Continue Coreg 12.5 mg twice daily, Entresto 24-26 mg twice daily, spironolactone 25 mg daily

## 2023-10-03 NOTE — Assessment & Plan Note (Signed)
Chronic Following with cardiology On warfarin which is monitored at our Coumadin clinic Rate controlled with carvedilol 12.5 mg twice daily

## 2023-10-03 NOTE — Assessment & Plan Note (Signed)
Chronic Regular exercise and healthy diet encouraged LDL at goal Continue atorvastatin 40 mg daily  Lab Results  Component Value Date   LDLCALC 53 10/02/2023

## 2023-10-03 NOTE — Assessment & Plan Note (Addendum)
Chronic Lab Results  Component Value Date   HGBA1C 6.8 (A) 04/02/2023   HGBA1C 6.8 04/02/2023   HGBA1C 6.8 (A) 04/02/2023   HGBA1C 6.8 04/02/2023   Controlled Continue jardiance 25 mg daily, rybelsus 3 mg daily Check A1c today , urine microalbumin Continue regular walking, diabetic diet

## 2023-10-03 NOTE — Assessment & Plan Note (Addendum)
Chronic DEXA up to date Continue regular walking Continue vitamin D supplementation Start calcium 5-600 mg once daily, eat a high calcium diet On tamoxifen

## 2023-10-03 NOTE — Assessment & Plan Note (Signed)
Chronic Taking B12 supplementation

## 2023-10-03 NOTE — Assessment & Plan Note (Signed)
Chronic Taking vitamin D daily Check vitamin D level

## 2023-10-03 NOTE — Assessment & Plan Note (Signed)
Chronic Follows with cardiology Appears euvolemic Continue Sherryll Burger, Jardiance and spironolactone

## 2023-10-03 NOTE — Assessment & Plan Note (Signed)
Chronic Occasional Otc medication prn Avoid snacking at night

## 2023-10-03 NOTE — Assessment & Plan Note (Signed)
Chronic Follow-up with cardiology No symptoms consistent with angina On warfarin, Coreg, Jardiance, spironolactone, Entresto, atorvastatin

## 2023-10-07 ENCOUNTER — Other Ambulatory Visit: Payer: Self-pay | Admitting: Internal Medicine

## 2023-10-07 MED ORDER — RYBELSUS 7 MG PO TABS: 7.0000 mg | ORAL_TABLET | Freq: Every day | ORAL | Status: DC

## 2023-10-15 ENCOUNTER — Other Ambulatory Visit: Payer: Self-pay | Admitting: Internal Medicine

## 2023-10-15 NOTE — Telephone Encounter (Signed)
 Copied from CRM [id number]. Topic: Clinical - Medication Refill >> Oct 15, 2023  1:51 PM Erling Hawthorne B wrote: Most Recent Primary Care Visit:  Provider: BURNS, Beckey Bourgeois  Department: [facility]  Visit Type: PHYSICAL  Date: 10/03/2023  Medication: 500 mg amoxicillin   Has the patient contacted their pharmacy? No (Agent: If no, request that the patient contact the pharmacy for the refill. If patient does not wish to contact the pharmacy document the reason why and proceed with request.) (Agent: If yes, when and what did the pharmacy advise?) Pt stated that there are no refills, has to get a new prescription from PCP   Is this the correct pharmacy for this prescription? Yes If no, delete pharmacy and type the correct one.  This is the patient's preferred pharmacy:  [facility] [address] [place] [address] Phone: [phone] Fax: [phone]   Has the prescription been filled recently? No  Is the patient out of the medication? Yes  Has the patient been seen for an appointment in the last year OR does the patient have an upcoming appointment?   Can we respond through MyChart?   Agent: Please be advised that Rx refills may take up to 3 business days. We ask that you follow-up with your pharmacy.

## 2023-10-16 ENCOUNTER — Other Ambulatory Visit (HOSPITAL_COMMUNITY): Payer: Self-pay

## 2023-10-16 MED ORDER — AMOXICILLIN 500 MG PO CAPS
500.0000 mg | ORAL_CAPSULE | Freq: Three times a day (TID) | ORAL | 1 refills | Status: AC
  Filled 2023-10-16: qty 90, 30d supply, fill #0

## 2023-10-19 ENCOUNTER — Other Ambulatory Visit (HOSPITAL_COMMUNITY): Payer: Self-pay

## 2023-10-31 ENCOUNTER — Telehealth: Payer: Self-pay | Admitting: Internal Medicine

## 2023-10-31 NOTE — Telephone Encounter (Unsigned)
 Copied from CRM [id number]. Topic: Clinical - Medication Question >> Oct 31, 2023 11:44 AM Presley Raddle C wrote: Reason for CRM: pt called and asked for someone to give her a call back to further discuss about the dose increase on her medication for Semaglutide [facility]). She mentioned that she needs it sent to [facility], [phone]. Please call and advise.

## 2023-11-01 MED ORDER — RYBELSUS 7 MG PO TABS: 7.0000 mg | ORAL_TABLET | Freq: Every day | ORAL | 0 refills | Status: DC

## 2023-11-01 NOTE — Telephone Encounter (Signed)
 Called pt about her medication, pt wanted medication send via mail delivery and wanted just 30 day supply due to the cost, request send.

## 2023-11-13 ENCOUNTER — Ambulatory Visit (HOSPITAL_COMMUNITY)
Admission: RE | Admit: 2023-11-13 | Discharge: 2023-11-13 | Disposition: A | Discharge status: Home / Self Care | Payer: Medicare HMO | Source: Ambulatory Visit | Attending: Cardiology | Admitting: Cardiology

## 2023-11-13 DIAGNOSIS — I5022 Chronic systolic (congestive) heart failure: Secondary | ICD-10-CM | POA: Diagnosis not present

## 2023-11-13 DIAGNOSIS — I081 Rheumatic disorders of both mitral and tricuspid valves: Secondary | ICD-10-CM | POA: Diagnosis not present

## 2023-11-13 LAB — ECHOCARDIOGRAM COMPLETE
AR max vel: 1.86 cm2
AV Peak grad: 5.8 mmHg
Ao pk vel: 1.2 m/s
Area-P 1/2: 3.1 cm2
Calc EF: 46.4 %
MV VTI: 1.32 cm2
S' Lateral: 3.9 cm
Single Plane A2C EF: 48.2 %
Single Plane A4C EF: 49.9 %

## 2023-11-16 ENCOUNTER — Ambulatory Visit: Payer: Medicare HMO

## 2023-11-16 DIAGNOSIS — Z7901 Long term (current) use of anticoagulants: Secondary | ICD-10-CM

## 2023-11-16 LAB — POCT INR: INR: 1.9 — AB (ref 2.0–3.0)

## 2023-11-16 NOTE — Patient Instructions (Addendum)
 Pre visit review using our clinic review tool, if applicable. No additional management support is needed unless otherwise documented below in the visit note.  Increase dose today to take 2 tablets and then continue 1 tablet daily except take 1 1/2 tablets on Fridays. Recheck in 4 weeks.

## 2023-11-16 NOTE — Progress Notes (Signed)
 Increase dose today to take 2 tablets and then continue 1 tablet daily except take 1 1/2 tablets on Fridays. Recheck in 4 weeks.

## 2023-12-05 ENCOUNTER — Other Ambulatory Visit: Payer: Self-pay | Admitting: Hematology

## 2023-12-05 DIAGNOSIS — C50112 Malignant neoplasm of central portion of left female breast: Secondary | ICD-10-CM

## 2023-12-14 ENCOUNTER — Ambulatory Visit
Admission: RE | Admit: 2023-12-14 | Discharge: 2023-12-14 | Disposition: A | Discharge status: Home / Self Care | Source: Ambulatory Visit | Attending: Nurse Practitioner | Admitting: Nurse Practitioner

## 2023-12-14 ENCOUNTER — Ambulatory Visit

## 2023-12-14 DIAGNOSIS — C50112 Malignant neoplasm of central portion of left female breast: Secondary | ICD-10-CM

## 2023-12-14 DIAGNOSIS — Z7901 Long term (current) use of anticoagulants: Secondary | ICD-10-CM | POA: Diagnosis not present

## 2023-12-14 DIAGNOSIS — R9230 Dense breasts, unspecified: Secondary | ICD-10-CM

## 2023-12-14 DIAGNOSIS — Z08 Encounter for follow-up examination after completed treatment for malignant neoplasm: Secondary | ICD-10-CM | POA: Diagnosis not present

## 2023-12-14 DIAGNOSIS — Z853 Personal history of malignant neoplasm of breast: Secondary | ICD-10-CM | POA: Diagnosis not present

## 2023-12-14 LAB — POCT INR: INR: 2.4 (ref 2.0–3.0)

## 2023-12-14 NOTE — Patient Instructions (Addendum)
 Pre visit review using our clinic review tool, if applicable. No additional management support is needed unless otherwise documented below in the visit note.  Continue 1 tablet daily except take 1 1/2 tablets on Fridays. Recheck in 6 weeks.

## 2023-12-14 NOTE — Progress Notes (Signed)
 Continue 1 tablet daily except take 1 1/2 tablets on Fridays. Recheck in 6 weeks.

## 2023-12-28 ENCOUNTER — Other Ambulatory Visit: Payer: Self-pay | Admitting: Internal Medicine

## 2023-12-30 ENCOUNTER — Other Ambulatory Visit: Payer: Self-pay | Admitting: Hematology

## 2023-12-30 DIAGNOSIS — C50112 Malignant neoplasm of central portion of left female breast: Secondary | ICD-10-CM

## 2024-01-09 ENCOUNTER — Other Ambulatory Visit: Payer: Self-pay

## 2024-01-09 DIAGNOSIS — C50112 Malignant neoplasm of central portion of left female breast: Secondary | ICD-10-CM

## 2024-01-10 ENCOUNTER — Inpatient Hospital Stay: Payer: Medicare HMO | Admitting: Nurse Practitioner

## 2024-01-10 ENCOUNTER — Inpatient Hospital Stay: Payer: Medicare HMO | Attending: Nurse Practitioner

## 2024-01-10 VITALS — BP 150/66 | HR 70 | Temp 98.0°F | Resp 17 | Wt 145.6 lb

## 2024-01-10 DIAGNOSIS — Z79899 Other long term (current) drug therapy: Secondary | ICD-10-CM | POA: Diagnosis not present

## 2024-01-10 DIAGNOSIS — C50112 Malignant neoplasm of central portion of left female breast: Secondary | ICD-10-CM

## 2024-01-10 DIAGNOSIS — Z1721 Progesterone receptor positive status: Secondary | ICD-10-CM | POA: Diagnosis not present

## 2024-01-10 DIAGNOSIS — Z170 Estrogen receptor positive status [ER+]: Secondary | ICD-10-CM | POA: Insufficient documentation

## 2024-01-10 DIAGNOSIS — Z1732 Human epidermal growth factor receptor 2 negative status: Secondary | ICD-10-CM | POA: Insufficient documentation

## 2024-01-10 LAB — CMP (CANCER CENTER ONLY)
ALT: 11 U/L (ref 0–44)
AST: 19 U/L (ref 15–41)
Albumin: 4.4 g/dL (ref 3.5–5.0)
Alkaline Phosphatase: 48 U/L (ref 38–126)
Anion gap: 6 (ref 5–15)
BUN: 13 mg/dL (ref 8–23)
CO2: 29 mmol/L (ref 22–32)
Calcium: 9.2 mg/dL (ref 8.9–10.3)
Chloride: 105 mmol/L (ref 98–111)
Creatinine: 0.68 mg/dL (ref 0.44–1.00)
GFR, Estimated: >60 mL/min (ref >60)
Glucose, Bld: 112 mg/dL — ABNORMAL HIGH (ref 70–99)
Potassium: 4 mmol/L (ref 3.5–5.1)
Sodium: 140 mmol/L (ref 135–145)
Total Bilirubin: 1.1 mg/dL (ref 0.0–1.2)
Total Protein: 7 g/dL (ref 6.5–8.1)

## 2024-01-10 LAB — CBC WITH DIFFERENTIAL (CANCER CENTER ONLY)
Abs Immature Granulocytes: 0 10*3/uL (ref 0.00–0.07)
Basophils Absolute: 0.1 10*3/uL (ref 0.0–0.1)
Basophils Relative: 1 %
Eosinophils Absolute: 0.1 10*3/uL (ref 0.0–0.5)
Eosinophils Relative: 1 %
HCT: 44.9 % (ref 36.0–46.0)
Hemoglobin: 15 g/dL (ref 12.0–15.0)
Immature Granulocytes: 0 %
Lymphocytes Relative: 24 %
Lymphs Abs: 1.3 10*3/uL (ref 0.7–4.0)
MCH: 30.2 pg (ref 26.0–34.0)
MCHC: 33.4 g/dL (ref 30.0–36.0)
MCV: 90.3 fL (ref 80.0–100.0)
Monocytes Absolute: 0.6 10*3/uL (ref 0.1–1.0)
Monocytes Relative: 11 %
Neutro Abs: 3.3 10*3/uL (ref 1.7–7.7)
Neutrophils Relative %: 63 %
Platelet Count: 179 10*3/uL (ref 150–400)
RBC: 4.97 MIL/uL (ref 3.87–5.11)
RDW: 13.2 % (ref 11.5–15.5)
WBC Count: 5.3 10*3/uL (ref 4.0–10.5)
nRBC: 0 % (ref 0.0–0.2)

## 2024-01-10 NOTE — Progress Notes (Signed)
 Patient Care Team: Colene Dauphin, MD as PCP - General (Internal Medicine) Darlis Eisenmenger, MD as PCP - Cardiology (Cardiology) Lei Pump, MD as PCP - Electrophysiology (Cardiology) Deena Farrier, MD as Consulting Physician (Cardiology) Claudette Cue, MD (Inactive) as Consulting Physician (Gastroenterology) Jhonny Moss, MD as Consulting Physician (Neurology) Sonja Welby, MD as Consulting Physician (Hematology) Lockie Rima, MD as Consulting Physician (General Surgery) Auther Bo, RN as Oncology Nurse Navigator Alane Hsu, RN as Oncology Nurse Navigator Burton, Lacie K, NP as Nurse Practitioner (Nurse Practitioner) Dema Filler, MD as Consulting Physician (Ophthalmology)  Clinic Day:  01/16/2024  Referring physician: Colene Dauphin, MD  ASSESSMENT & PLAN:   Assessment & Plan: 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 Otherwise, no mutations. -she underwent lumpectomy on 05/26/21 with Dr. Cherlynn Cornfield showing: IDC, 1.5 cm, grade 2; DCIS, intermediate grade; resection margins negative. -she began tamoxifen  on 09/13/21. She reports weight gain and hot flashes. -- Diagnostic mammogram done 12/14/2023 showed benign results.  Repeat mammogram due in April 2026. --continue tamoxifen  20 mg daily.  --labs with follow up in 6 months, sooner if needed    Plan Labs reviewed.  -stable and unremarkable.  Reviewed diagnostic mammogram from 12/2023 which was benign. She will be due for repeat in 12/2024.  Continue tamoxifen  daily.  Continue breat cancer surveillance with labs and follow up in 6 months, sooner if needed   The patient understands the plans discussed today and is in agreement with them.  She knows to contact our office if she  develops concerns prior to her next appointment.  I provided 25 minutes of face-to-face time during this encounter and > 50% was spent counseling as documented under my assessment and plan.    Anita Deis, NP   C[facility] - A DEPT OF [facility] [address] [place] [address] Dept: [phone] Dept Fax: [phone]   No orders of the defined types were placed in this encounter.     CHIEF COMPLAINT:  CC: Left breast cancer, estrogen receptor positive  Current Treatment: Tamoxifen  20 mg daily starting 09/13/2021.  INTERVAL HISTORY:  Anita Duke is here today for repeat clinical assessment.  She was last seen by myself on 07/12/2023.  She had 3D diagnostic mammogram done 12/14/2023. There is no evidence of malignancy in either breast.  Repeat mammogram should be done in 1 year. She denies new masses or lumps in either breast. She denies chest pain, chest pressure, or shortness of breath. She denies headaches or visual disturbances. She denies abdominal pain, nausea, vomiting, or changes in bowel or bladder habits.   denies fevers or chills. She denies pain. Her appetite is good. Her weight has decreased 3 pounds over last 4 months.  I have reviewed the past medical history, past surgical history, social history and family history with the patient and they are unchanged from previous note.  ALLERGIES:  is allergic to demerol [meperidine], morphine , januvia  [sitagliptin ], metformin  and related, and tetanus toxoid.  MEDICATIONS:  Current Outpatient Medications  Medication Sig Dispense Refill   amoxicillin  (AMOXIL ) 500 MG capsule Take 1 capsule (500 mg total) by mouth 3 (three) times daily. 90 capsule 1   atorvastatin  (LIPITOR) 40 MG  tablet TAKE 1 TABLET DAILY 90 tablet 3   carvedilol  (COREG ) 12.5 MG tablet TAKE 1 TABLET TWICE DAILY  WITH MEALS 180 tablet 3   cetirizine (ZYRTEC) 10 MG tablet Take 10 mg by mouth daily.      cholecalciferol  (VITAMIN D ) 1000 UNITS tablet Take 1,000 Units by mouth daily.      cyanocobalamin  (VITAMIN B12) 500 MCG tablet Take 500 mcg by mouth daily.     fish oil-omega-3 fatty acids 1000 MG capsule Take 1 g by mouth 2 (two) times daily.     JARDIANCE  25 MG TABS tablet TAKE 1 TABLET DAILY 30 tablet 11   Polyethyl Glycol-Propyl Glycol (SYSTANE) 0.4-0.3 % SOLN Place 1 drop into both eyes daily.     RYBELSUS  7 MG TABS TAKE 1 TABLET DAILY 30 tablet 0   sacubitril -valsartan  (ENTRESTO ) 24-26 MG TAKE 1 TABLET TWICE A DAY 60 tablet 11   sodium chloride  (OCEAN) 0.65 % SOLN nasal spray Place 1 spray into both nostrils 2 (two) times daily.     spironolactone  (ALDACTONE ) 25 MG tablet TAKE 1 TABLET DAILY 90 tablet 3   tamoxifen  (NOLVADEX ) 20 MG tablet TAKE 1 TABLET DAILY 90 tablet 2   venlafaxine  XR (EFFEXOR -XR) 37.5 MG 24 hr capsule TAKE 1 CAPSULE DAILY 90 capsule 2   warfarin (JANTOVEN ) 2.5 MG tablet TAKE 1 TABLET DAILY EXCEPT TAKE 1 1/2 TABLETS ON FRIDAYS OR AS  DIRECTED BY ANTICOAGULATION CLINIC 105 tablet 3   predniSONE  (STERAPRED UNI-PAK 21 TAB) 5 MG (21) TBPK tablet Take as directed per package instructions. 21 tablet 0   No current facility-administered medications for this visit.    HISTORY OF PRESENT ILLNESS:   Oncology History Overview Note  Cancer Staging Malignant neoplasm of central portion of left breast [facility]) Staging form: Breast, AJCC 8th Edition - Clinical stage from 04/11/2021: Stage IA (cT1c, cN0, cM0, G2, ER+, PR+, HER2-) - Signed by Sonja [place], MD on 04/27/2021    Malignant neoplasm of central portion of left breast (HCC)  04/09/2021 Mammogram   Bilateral Diagnostic, and Left Breast US   FINDINGS: Spiculated mass in the left breast containing calcifications at 3 o'clock, 6 cm from the nipple measuring 11 x 9 x 9 mm. No axillary adenopathy.     04/11/2021 Cancer Staging   Staging form: Breast, AJCC 8th Edition - Clinical stage from 04/11/2021: Stage IA (cT1c, cN0, cM0, G2,  ER+, PR+, HER2-) - Signed by Sonja [place], MD on 04/27/2021 Stage prefix: Initial diagnosis Histologic grading system: 3 grade system   04/11/2021 Pathology Results   Diagnosis Breast, left, needle core biopsy, 3:00 - INVASIVE DUCTAL CARCINOMA, SEE COMMENT. - DUCTAL CARCINOMA IN SITU. Microscopic Comment The carcinoma appears grade 2.  PROGNOSTIC INDICATORS Results: IMMUNOHISTOCHEMICAL AND MORPHOMETRIC ANALYSIS PERFORMED MANUALLY The tumor cells are NEGATIVE for Her2 (1+). 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  No other pathogenic variants detected in [facility] CancerNext-Expanded +RNAinsight Panel. The report date is May 17, 2021.    The CancerNext-Expanded gene panel offered by [facility] and includes sequencing, rearrangement, and RNA analysis for the following 77 genes: AIP, ALK, APC, ATM, AXIN2, BAP1, BARD1, BLM, BMPR1A, BRCA1,  BRCA2, BRIP1, CDC73, CDH1, CDK4, CDKN1B, CDKN2A, CHEK2, CTNNA1, DICER1, FANCC, FH, FLCN, GALNT12, KIF1B, LZTR1, MAX, MEN1, MET, MLH1, MSH2, MSH3, MSH6, MUTYH, NBN, NF1, NF2, NTHL1, PALB2, PHOX2B, PMS2, POT1, PRKAR1A, PTCH1, PTEN, RAD51C, RAD51D, RB1, RECQL, RET, SDHA, SDHAF2, SDHB, SDHC, SDHD, SMAD4, SMARCA4, SMARCB1, SMARCE1, STK11, SUFU, TMEM127, TP53, TSC1, TSC2, VHL and XRCC2 (sequencing and deletion/duplication); EGFR, EGLN1, HOXB13, KIT, MITF, PDGFRA, POLD1, and POLE (sequencing only); EPCAM and GREM1 (deletion/duplication only).    05/26/2021 Definitive Surgery   FINAL MICROSCOPIC DIAGNOSIS:   A. BREAST, LEFT,  LUMPECTOMY:  - Invasive ductal carcinoma, 1.5 cm, grade 2  - Ductal carcinoma in situ, intermediate grade  - Resection margins are negative for carcinoma; closest is the posterior margin at 0.3 cm  - Patchy atypical ductal hyperplasia  - Fibrocystic change with usual ductal hyperplasia, apocrine metaplasia and calcifications  - Biopsy site changes  - See oncology table    05/26/2021 Cancer Staging   Staging form: Breast, AJCC 8th Edition - Pathologic stage from 05/26/2021: Stage Unknown (pT1c, pNX, cM0, G2, ER+, PR+, HER2-) - Signed by Sonja , MD on 06/30/2021 Stage prefix: Initial diagnosis Multigene prognostic tests performed: None Histologic grading system: 3 grade system Residual tumor (R): R0 - None   10/04/2021 Survivorship   SCP delivered in person by Lacie Burton, NP   04/12/2023 Imaging   Bone density test Assessment: By the [facility] Criteria for diagnosis based on bone density, this patient has Low Bone Density  FRAX 10-year fracture risk calculator: 13.5 % for any major fracture and 3.6 % for hip fracture. Pharmacologic therapy is recommended if 10 year fracture risk is >20% for any major osteoporotic fracture or >3% for hip fracture.   L2,L3  vertebrae had to be excluded from analysis          REVIEW OF SYSTEMS:   Constitutional: Denies fevers, chills or abnormal weight loss Eyes: Denies blurriness of vision Ears, nose, mouth, throat, and face: Denies mucositis or sore throat Respiratory: Denies cough, dyspnea or wheezes Cardiovascular: Denies palpitation, chest discomfort or lower extremity swelling Gastrointestinal:  Denies nausea, heartburn or change in bowel habits Skin: Denies abnormal skin rashes Lymphatics: Denies new lymphadenopathy or easy bruising Neurological:Denies numbness, tingling or new weaknesses Behavioral/Psych: Mood is stable, no new changes  All other systems were reviewed with the patient and are negative.   VITALS:   Today's Vitals    01/10/24 1026 01/10/24 1041  BP: (!) 150/66   Pulse: 70   Resp: 17   Temp: 98 F (36.7 C)   SpO2: 99%   Weight: 145 lb 9.6 oz (66 kg)   PainSc:  0-No pain   Body mass index is 28.44 kg/m.   Wt Readings from Last 3 Encounters:  01/10/24 145 lb 9.6 oz (66 kg)  10/03/23 148 lb (67.1 kg)  10/02/23 148 lb 6.4 oz (67.3 kg)    Body mass index is 28.44 kg/m.  Performance status (ECOG): 1 - Symptomatic but completely ambulatory  PHYSICAL EXAM:   GENERAL:alert, no distress and comfortable SKIN: skin color, texture, turgor are normal, no rashes or significant lesions EYES: normal, Conjunctiva are pink and non-injected, sclera clear OROPHARYNX:no exudate, no erythema and lips, buccal mucosa, and tongue normal  NECK: supple, thyroid  normal size, non-tender, without nodularity LYMPH:  no palpable lymphadenopathy in the cervical, axillary or inguinal LUNGS: clear to auscultation and percussion with normal breathing effort HEART: regular rate & rhythm and no murmurs and no lower extremity edema ABDOMEN:abdomen soft, non-tender  and normal bowel sounds Musculoskeletal:no cyanosis of digits and no clubbing  NEURO: alert & oriented x 3 with fluent speech, no focal motor/sensory deficits BREAST: there are well healed surgical lumpectomy scars on left breast. There are no palpable masses or lumps. There is no nipple inversion or nipple discharge. There is no axillary lymphadenopathy on the left. There are no palpable masses or lumps in the right breast. There is no nipple inversion or nipple discharge. There is no axillary lymphadenopathy on the right.   LABORATORY DATA:  I have reviewed the data as listed    Component Value Date/Time   NA 140 01/10/2024 0946   NA 144 06/19/2022 1435   NA 143 01/20/2014 1453   K 4.0 01/10/2024 0946   K 4.3 01/20/2014 1453   CL 105 01/10/2024 0946   CL 106 12/30/2012 1511   CO2 29 01/10/2024 0946   CO2 27 01/20/2014 1453   GLUCOSE 112 (H) 01/10/2024 0946    GLUCOSE 150 (H) 01/20/2014 1453   GLUCOSE 104 (H) 12/30/2012 1511   BUN 13 01/10/2024 0946   BUN 13 06/19/2022 1435   BUN 13.8 01/20/2014 1453   CREATININE 0.68 01/10/2024 0946   CREATININE 0.64 03/16/2020 0957   CREATININE 0.8 01/20/2014 1453   CALCIUM  9.2 01/10/2024 0946   CALCIUM  9.5 01/20/2014 1453   PROT 7.0 01/10/2024 0946   PROT 6.5 01/20/2014 1453   ALBUMIN  4.4 01/10/2024 0946   ALBUMIN  3.6 01/20/2014 1453   AST 19 01/10/2024 0946   AST 19 01/20/2014 1453   ALT 11 01/10/2024 0946   ALT 20 01/20/2014 1453   ALKPHOS 48 01/10/2024 0946   ALKPHOS 80 01/20/2014 1453   BILITOT 1.1 01/10/2024 0946   BILITOT 0.82 01/20/2014 1453   GFRNONAA >60 01/10/2024 0946   GFRNONAA >89 06/11/2015 1101   GFRAA >60 09/09/2019 1030   GFRAA >89 06/11/2015 1101    Lab Results  Component Value Date   WBC 5.3 01/10/2024   NEUTROABS 3.3 01/10/2024   HGB 15.0 01/10/2024   HCT 44.9 01/10/2024   MCV 90.3 01/10/2024   PLT 179 01/10/2024     RADIOGRAPHIC STUDIES: No results found.

## 2024-01-10 NOTE — Assessment & Plan Note (Addendum)
 stage IA, pT1cN0, ER+/PR+/HER2-, Grade 2  -presented with palpable left breast lump. 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 Otherwise, no mutations. -she underwent lumpectomy on 05/26/21 with Dr. Cherlynn Cornfield showing: IDC, 1.5 cm, grade 2; DCIS, intermediate grade; resection margins negative. -she began tamoxifen  on 09/13/21. She reports weight gain and hot flashes. -- Diagnostic mammogram done 12/14/2023 showed benign results.  Repeat mammogram due in April 2026. --continue tamoxifen  20 mg daily.  --labs with follow up in 6 months, sooner if needed

## 2024-01-11 ENCOUNTER — Telehealth: Payer: Self-pay | Admitting: Nurse Practitioner

## 2024-01-11 NOTE — Telephone Encounter (Signed)
 Scheduled appointments with the patient. She is aware of the appointment details and will be mailed a reminder.

## 2024-01-14 ENCOUNTER — Telehealth: Payer: Self-pay | Admitting: Internal Medicine

## 2024-01-14 ENCOUNTER — Other Ambulatory Visit (HOSPITAL_COMMUNITY): Payer: Self-pay

## 2024-01-14 DIAGNOSIS — M545 Low back pain, unspecified: Secondary | ICD-10-CM | POA: Diagnosis not present

## 2024-01-14 MED ORDER — PREDNISONE 5 MG (21) PO TBPK
ORAL_TABLET | ORAL | 0 refills | Status: DC
  Filled 2024-01-14: qty 21, 6d supply, fill #0

## 2024-01-14 NOTE — Telephone Encounter (Signed)
 Yes, it is okay to take, but it will elevate sugars while she is on the medication.  Sugar should decrease after a few days of being off the medication.

## 2024-01-14 NOTE — Telephone Encounter (Signed)
 Copied from CRM [id number]. Topic: Clinical - Medication Question >> Jan 14, 2024 11:57 AM Jayson Michael wrote: Reason for CRM: patient was seen today at [facility], she was called in a prescription for predniSONE  (STERAPRED UNI-PAK 21 TAB) 5 MG by brandon morelli, she stated he advised her to contact her PCP to make sure it was okay for her to take this medication. Patient would like a callback at [phone]

## 2024-01-15 NOTE — Telephone Encounter (Signed)
 Copied from CRM [id number]. Topic: Clinical - Medication Question >> Jan 15, 2024 10:05 AM Kita Perish H wrote: Patient returned call following up on previous message sent regarding medication, agent gave patient message from provider as stated, patient acknowledged stating she will go pick medication up and start. >> Jan 14, 2024  4:05 PM [place] J wrote: Patient calling back for an update. I let her know that at the latest, her medication question should be addressed sometime in the morning.

## 2024-01-16 ENCOUNTER — Encounter: Payer: Self-pay | Admitting: Nurse Practitioner

## 2024-01-21 ENCOUNTER — Other Ambulatory Visit: Payer: Self-pay | Admitting: Internal Medicine

## 2024-01-22 ENCOUNTER — Other Ambulatory Visit: Payer: Self-pay | Admitting: Internal Medicine

## 2024-01-22 ENCOUNTER — Ambulatory Visit (INDEPENDENT_AMBULATORY_CARE_PROVIDER_SITE_OTHER)

## 2024-01-22 DIAGNOSIS — Z7901 Long term (current) use of anticoagulants: Secondary | ICD-10-CM

## 2024-01-22 LAB — POCT INR: INR: 2.5 (ref 2.0–3.0)

## 2024-01-22 MED ORDER — RYBELSUS 7 MG PO TABS: 1.0000 | ORAL_TABLET | Freq: Every day | ORAL | 5 refills | Status: DC

## 2024-01-22 NOTE — Telephone Encounter (Signed)
 Pt was in coumadin  clinic today and requested refills of Rybelsus . Advised a request has been sent from pharmacy. Advised a refill would be sent and if any problems to contact the office. Pt appreciative and verbalized understanding.   Sent in script.

## 2024-01-22 NOTE — Progress Notes (Signed)
 Prescribed prednisone  on 5/12. Has now finished medication. Continue 1 tablet daily except take 1 1/2 tablets on Fridays. Recheck in 6 weeks.

## 2024-01-22 NOTE — Patient Instructions (Addendum)
 Pre visit review using our clinic review tool, if applicable. No additional management support is needed unless otherwise documented below in the visit note.  Continue 1 tablet daily except take 1 1/2 tablets on Fridays. Recheck in 6 weeks.

## 2024-01-23 ENCOUNTER — Telehealth: Payer: Self-pay | Admitting: Internal Medicine

## 2024-01-23 NOTE — Telephone Encounter (Unsigned)
 Copied from CRM [id number]. Topic: Clinical - Prescription Issue >> Jan 23, 2024  8:55 AM Chasity T wrote: Reason for CRM: [facility] calling in for [facility] regarding medication Semaglutide  (RYBELSUS ) 7 MG TABS is currently on manufacture back order and wants to know how you would like to proceed with the medication at the moment. Please call back [phone] option 2 reference number [id number].

## 2024-01-30 ENCOUNTER — Encounter (HOSPITAL_COMMUNITY): Payer: Medicare HMO

## 2024-02-05 ENCOUNTER — Telehealth (HOSPITAL_COMMUNITY): Payer: Self-pay

## 2024-02-05 NOTE — Progress Notes (Signed)
 [facility] Clinic Note    Primary Care: Dr. Oma Bias HF Cardiologist: Dr. Mitzie Anda  HPI:  Anita Duke is a 83 y.o. with a past medical history of pulmonary HTN, mitral regurgitation s/p mitral valve repair, MAZE procedure at the [facility] in 2001, at that time she also had a LIMA to LAD graft placed. Also with history of permanent Afib on warfarin s/p AVN ablation. At one time she was on Tikosyn  which was stopped for QT prolongation.  She has chronic systolic CHF (EF 16% in 12/2014).  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 Last cath in 2014 with atretic LIMA to LAD but nonobstructive disease in LAD.   Echo in 10/18 showed EF improved to 55-60% with mild to moderate RV dysfunction.   Echo in 7/20 showed EF 50% with basal inferior and inferolateral akinesis, mildly decreased RV systolic function, s/p MV repair with trivial MR, no MS, normal IVC.  Echo in 1/22 showed EF 45-50% with basal inferior and basal inferolateral akinesis, mildly decreased RV systolic function, s/p MV repair with trivial MR and mean gradient 4 mmHg.   Patient developed breast cancer in 2022 and is status post lumpectomy.  Echo 12/22 showed EF 45-50%, diffuse hypokinesis, mildly decreased RV function, repaired mitral valve with trivial MR no MS, normal IVC.   Patient was admitted in 3/24 with metapneumovirus PNA.  HS-troponin was found to be elevated to 3029 and she had some chest pain, suspected NSTEMI.  Coronary CTA was done, showing totally occluded LCx with collaterals, atretic LIMA but patent LAD.  There was a possible small ulceration in the ascending aorta.  Echo in 3/24 showed EF 50-55%, akinesis of the basal -mid inferior and inferolateral walls, moderate RV dysfunction, s/p MV repair with minimal MR.   Today she returns for HF follow up. Weight down 2 lbs.  She walks her dog for exercise 4 times/day.  No dyspnea walking on flat ground but feels like her stamina is a little worse since  last year.  She is mildly short of breath walking up stairs. No chest pain.  No lightheadedness.   ECG (personally reviewed): atrial fibrillation, BiV pacing  Labs (11/23): K 4.3, creatinine 0.64 Labs (3/24): K 4.3, creatinine 0.72 Labs (5/24): K 3.8, creatinine 0.61, LDL 63 Labs (7/24): creatinine 0.70 Labs (11/24): K 4.4, creatinine 0.72  ROS: All systems reviewed and negative except as per HPI.  PMH: 1. Type II diabetes. 2. COPD 3. Depression 4. H/o colon cancer 5. CAD: LIMA-LAD in 4/01 with MV repair.   - LHC (8/14): LIMA-LAD atretic, 50-60% mid LAD stenosis.  - Coronary CTA (3/24): totally occluded LCx with collaterals, atretic LIMA but patent LAD with 50-69% proximal stenosis.  6. Mitral regurgitation: S/p MV repair at the [facility] in 4/01.  She also had LAA ligation and MAZE. Echo in 7/20 with trivial MR and no MS.  7. 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   - St Jude CRT-P device.  - Echo (4/16): EF 40%, mild LV dilation, normal RV size with mild to moderately decreased systolic function, s/p mitral valve repair with mild MR, no significant stenosis.  - Echo (10/18): EF 55-60%, severe LAE, RV with mild to moderate systolic dysfunction, mild-moderate TR, PASP 35 mmHg.  - Echo (7/20): EF 50% with basal inferior and inferolateral akinesis, mildly decreased RV systolic function, s/p MV repair with trivial MR, no MS, normal IVC.  - Echo (1/22): EF 45-50% with basal inferior and  basal inferolateral akinesis, mildly decreased RV systolic function, s/p MV repair with trivial MR and mean gradient 4 mmHg. - Echo (12/22): EF 45-50%, diffuse hypokinesis, mildly decreased RV function, repaired mitral valve with trivial MR no MS, normal IVC.  - Echo (3/24): EF 50-55%, akinesis of the basal -mid inferior and inferolateral walls, moderate RV dysfunction, s/p MV repair with minimal MR.  8. Permanent atrial fibrillation: S/p MAZE in 4/01.  S/p AV nodal ablation in 9/13.    9. PFTs (1/19): No significant obstruction or restriction in the lungs.  10. Breast cancer: S/p lumpectomy in 2022.   ROS: All systems reviewed and negative except as per HPI.   Current Outpatient Medications  Medication Sig Dispense Refill   amoxicillin  (AMOXIL ) 500 MG capsule Take 1 capsule (500 mg total) by mouth 3 (three) times daily. 90 capsule 1   atorvastatin  (LIPITOR) 40 MG tablet TAKE 1 TABLET DAILY 90 tablet 3   carvedilol  (COREG ) 12.5 MG tablet TAKE 1 TABLET TWICE DAILY  WITH MEALS 180 tablet 3   cetirizine (ZYRTEC) 10 MG tablet Take 10 mg by mouth daily.     cholecalciferol  (VITAMIN D ) 1000 UNITS tablet Take 1,000 Units by mouth daily.      cyanocobalamin  (VITAMIN B12) 500 MCG tablet Take 500 mcg by mouth daily.     fish oil-omega-3 fatty acids 1000 MG capsule Take 1 g by mouth 2 (two) times daily.     JARDIANCE  25 MG TABS tablet TAKE 1 TABLET DAILY 30 tablet 11   Polyethyl Glycol-Propyl Glycol (SYSTANE) 0.4-0.3 % SOLN Place 1 drop into both eyes daily.     predniSONE  (STERAPRED UNI-PAK 21 TAB) 5 MG (21) TBPK tablet Take as directed per package instructions. 21 tablet 0   sacubitril -valsartan  (ENTRESTO ) 24-26 MG TAKE 1 TABLET TWICE A DAY 60 tablet 11   Semaglutide  (RYBELSUS ) 7 MG TABS PLEASE MAKE CHANGES TO INCLUDE DRUG NAME/STR/DIRECTIONS/QTY/REFILLS BASED ON ALTERNATE PRESCRIBED MEDICATION 30 tablet 5   sodium chloride  (OCEAN) 0.65 % SOLN nasal spray Place 1 spray into both nostrils 2 (two) times daily.     spironolactone  (ALDACTONE ) 25 MG tablet TAKE 1 TABLET DAILY 90 tablet 3   tamoxifen  (NOLVADEX ) 20 MG tablet TAKE 1 TABLET DAILY 90 tablet 2   venlafaxine  XR (EFFEXOR -XR) 37.5 MG 24 hr capsule TAKE 1 CAPSULE DAILY 90 capsule 2   warfarin (JANTOVEN ) 2.5 MG tablet TAKE 1 TABLET DAILY EXCEPT TAKE 1 1/2 TABLETS ON FRIDAYS OR AS  DIRECTED BY ANTICOAGULATION CLINIC 105 tablet 3   No current facility-administered medications for this visit.   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    Employer: Engineer, maintenance (IT): [employer]   Tobacco Use   Smoking status: Never   Smokeless tobacco: Never  Vaping Use   Vaping status: Never Used  Substance and Sexual Activity   Alcohol use: No   Drug  use: No   Sexual activity: Not Currently    Birth control/protection: Surgical  Other Topics Concern   Not on file  Social History Narrative   No caffeine   Right handed    Lives alone in a retired home Office manager)   Has a Nurse, mental health   Social Drivers of Corporate investment banker Strain: Low Risk  (06/12/2023)   Overall Financial Resource Strain (CARDIA)    Difficulty of Paying Living Expenses: Not hard at all  Food Insecurity: No Food Insecurity (06/12/2023)   Hunger Vital Sign    Worried About Running Out of Food in the Last Year: Never true    Ran Out of Food in the Last Year: Never true  Transportation Needs: No Transportation Needs (06/12/2023)   PRAPARE - Administrator, Civil Service (Medical): No    Lack of Transportation (Non-Medical): No  Physical Activity: Sufficiently Active (06/12/2023)   Exercise Vital Sign    Days of Exercise per Week: 7 days    Minutes of Exercise per Session: 30 min  Stress: No Stress Concern Present (06/12/2023)   [employer] of Occupational Health - Occupational Stress Questionnaire    Feeling of Stress : Not at all  Social Connections: Moderately Isolated (06/12/2023)   Social  Connection and Isolation Panel [NHANES]    Frequency of Communication with Friends and Family: More than three times a week    Frequency of Social Gatherings with Friends and Family: More than three times a week    Attends Religious Services: More than 4 times per year    Active Member of [employer] or Organizations: No    Attends Banker Meetings: Never    Marital Status: Widowed  Intimate Partner Violence: Patient Unable To Answer (06/12/2023)   Humiliation, Afraid, Rape, and Kick questionnaire    Fear of Current or Ex-Partner: Patient unable to answer    Emotionally Abused: Patient unable to answer    Physically Abused: Patient unable to answer    Sexually Abused: Patient unable to answer   Family History  Problem Relation Age of Onset   Diabetes Mother    Kidney disease Mother    Thyroid  disease Mother    Alzheimer's disease Mother        Symptom onset in late 9s; confirmed via autopsy   Depression Mother    Heart disease Father    Diabetes Father    Heart disease Sister        x 2   Colon cancer Maternal Aunt        dx 61s; mother's paternal half sister   Colon cancer Maternal Aunt        dx late 38s   Heart disease Paternal Uncle        x 6   Cancer Maternal Grandfather        unknown type; dx unknown age   Diabetes Paternal Grandmother    Diabetes Paternal Grandfather    Colon cancer Cousin    Brain cancer Nephew 52   Colon polyps Neg Hx    Adrenal disorder Neg Hx    Esophageal cancer Neg Hx    Rectal cancer Neg Hx    Stomach cancer Neg Hx    There were no vitals taken for this visit.  Wt Readings from Last 3 Encounters:  01/10/24 66 kg (145 lb 9.6 oz)  10/03/23 67.1 kg (148 lb)  10/02/23 67.3 kg (148 lb 6.4 oz)   PHYSICAL EXAM: General: NAD  Neck: No JVD, no thyromegaly or thyroid  nodule.  Lungs: Clear to auscultation bilaterally with normal respiratory effort. CV: Nondisplaced PMI.  Heart regular S1/S2, no S3/S4, no murmur.  No peripheral edema.   No carotid bruit.  Normal pedal pulses.  Abdomen: Soft, nontender, no hepatosplenomegaly, no distention.  Skin: Intact without lesions or rashes.  Neurologic: Alert and oriented x 3.  Psych: Normal affect. Extremities: No clubbing or cyanosis.  HEENT: Normal.   ASSESSMENT & PLAN: 1. Chronic systolic CHF: EF 16% in 2016. NICM most likely.  She has LIMA-LAD that is atretic, but no flow limiting disease in the LAD.  Cardiomyopathy may be due to RV pacing after AV nodal ablation in 9/13. She now has St Jude CRT-P device and EF was up to 55-60% on 10/18 echo. Echo in 7/20 showed EF about 50%.  Echo in 1/22 and in 12/22 showed stable EF 45-50%.  Echo in 3/24 showed EF 50-55%, akinesis of the basal -mid inferior and inferolateral walls, moderate RV dysfunction, s/p MV repair with minimal MR.  NYHA class II, weight down, not volume overloaded on exam.  - I will arrange for repeat echo to follow LV function.  - She does not need regular Lasix .  - Continue Coreg  12.5 mg bid.  - Continue Jardiance .  - Continue Entresto  24/26 bid, will increase if EF is lower on 3/25 echo.  - Continue spironolactone  25 mg daily.   - Check BMET/BNP today.  2. CAD: LIMA-LAD at time of MV surgery in 4/01, but LIMA-LAD atretic on 8/14 cath.  However, at that time her LAD had moderate stenosis that did not appear flow-limiting (50-60%).  Coronary CTA in 3/24 showed totally occluded LCx with collaterals, atretic LIMA but patent LAD with 50-69% proximal stenosis.  NSTEMI in 3/24 but coronary CTA showed no culprit lesion.  The occluded LCx was old as evidenced by collaterals and also prior echoes have shown LCx territory wall motion abnormalities.  No chest pain.  - Continue atorvastatin  40 mg daily. Check lipids today.  - No ASA given use of warfarin 3. Complete heart block: She had an AV nodal ablation.  Now s/p CRT-P.   4. Permanent atrial fibrillation: Now s/p AV nodal ablation and BiV pacing. - Continue warfarin for  anticoagulation. CBC today.  - She could swich anticoagulation to Eliquis, data suggests ok to use DOAC with valve repair, bioprosthetic valve.  However, she has wanted to continue warfarin as she has been on it stably x years.  5. DM: Continue Jardiance .  6. S/p MV repair: Stable mitral valve repair on 3/24 echo.   - Repeat echo in 3/25.  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  - CTA chest showed aortic abnormality is scarring from prior cardiopulmonary bypass cannulation, no penetrating aortic ulceration.  Follow up in 4 months with APP  Elmarie Hacking, FNP 02/05/24

## 2024-02-05 NOTE — Telephone Encounter (Signed)
 Called to confirm/remind patient of their appointment at the [facility] Clinic on 02/05/24.   Appointment:   [] Confirmed  [x] Left mess   [] No answer/No voice mail  [] VM Full/unable to leave message  [] Phone not in service  And to bring in all medications and/or complete list.

## 2024-02-06 ENCOUNTER — Encounter (HOSPITAL_COMMUNITY): Payer: Self-pay

## 2024-02-06 ENCOUNTER — Ambulatory Visit (HOSPITAL_COMMUNITY)
Admission: RE | Admit: 2024-02-06 | Discharge: 2024-02-06 | Disposition: A | Discharge status: Home / Self Care | Payer: Medicare HMO | Source: Ambulatory Visit | Attending: Family Medicine | Admitting: Family Medicine

## 2024-02-06 VITALS — BP 118/62 | HR 70 | Ht 61.0 in | Wt 143.8 lb

## 2024-02-06 DIAGNOSIS — Z7901 Long term (current) use of anticoagulants: Secondary | ICD-10-CM | POA: Diagnosis not present

## 2024-02-06 DIAGNOSIS — I081 Rheumatic disorders of both mitral and tricuspid valves: Secondary | ICD-10-CM | POA: Insufficient documentation

## 2024-02-06 DIAGNOSIS — Z950 Presence of cardiac pacemaker: Secondary | ICD-10-CM | POA: Diagnosis not present

## 2024-02-06 DIAGNOSIS — I252 Old myocardial infarction: Secondary | ICD-10-CM | POA: Diagnosis not present

## 2024-02-06 DIAGNOSIS — I442 Atrioventricular block, complete: Secondary | ICD-10-CM | POA: Insufficient documentation

## 2024-02-06 DIAGNOSIS — I429 Cardiomyopathy, unspecified: Secondary | ICD-10-CM | POA: Diagnosis not present

## 2024-02-06 DIAGNOSIS — I2510 Atherosclerotic heart disease of native coronary artery without angina pectoris: Secondary | ICD-10-CM | POA: Diagnosis not present

## 2024-02-06 DIAGNOSIS — I4821 Permanent atrial fibrillation: Secondary | ICD-10-CM | POA: Insufficient documentation

## 2024-02-06 DIAGNOSIS — Z7984 Long term (current) use of oral hypoglycemic drugs: Secondary | ICD-10-CM | POA: Diagnosis not present

## 2024-02-06 DIAGNOSIS — I5022 Chronic systolic (congestive) heart failure: Secondary | ICD-10-CM | POA: Diagnosis not present

## 2024-02-06 DIAGNOSIS — Z79899 Other long term (current) drug therapy: Secondary | ICD-10-CM | POA: Diagnosis not present

## 2024-02-06 DIAGNOSIS — E119 Type 2 diabetes mellitus without complications: Secondary | ICD-10-CM | POA: Diagnosis not present

## 2024-02-06 DIAGNOSIS — Z853 Personal history of malignant neoplasm of breast: Secondary | ICD-10-CM | POA: Diagnosis not present

## 2024-02-06 DIAGNOSIS — Z9889 Other specified postprocedural states: Secondary | ICD-10-CM

## 2024-02-06 NOTE — Patient Instructions (Addendum)
 Thank you for coming in today  If you had labs drawn today, any labs that are abnormal the clinic will call you No news is good news  Medications: No changes  Follow up appointments:  Your physician recommends that you schedule a follow-up appointment in:  6 months With Dr. Mitzie Anda Please call our office to schedule the follow-up appointment in October for December 2025   Do the following things EVERYDAY: Weigh yourself in the morning before breakfast. Write it down and keep it in a log. Take your medicines as prescribed Eat low salt foods--Limit salt (sodium) to 2000 mg per day.  Stay as active as you can everyday Limit all fluids for the day to less than 2 liters   At the [facility], you and your health needs are our priority. As part of our continuing mission to provide you with exceptional heart care, we have created designated Provider Care Teams. These Care Teams include your primary Cardiologist (physician) and Advanced Practice Providers (APPs- Physician Assistants and Nurse Practitioners) who all work together to provide you with the care you need, when you need it.   You may see any of the following providers on your designated Care Team at your next follow up: Dr Jules Oar Dr Peder Bourdon Dr. Mimi Alt, NP Ruddy Corral, [place] [facility] [place], [place] Dennise Fitz, NP Luster Salters, PharmD   Please be sure to bring in all your medications bottles to every appointment.    Thank you for choosing [facility]  If you have any questions or concerns before your next appointment please send us  a message through [place] or call our office at [phone].    TO LEAVE A MESSAGE FOR THE NURSE SELECT OPTION 2, PLEASE LEAVE A MESSAGE INCLUDING: YOUR NAME DATE OF BIRTH CALL BACK NUMBER REASON FOR CALL**this is important as we prioritize the call backs  YOU WILL RECEIVE A CALL BACK  THE SAME DAY AS LONG AS YOU CALL BEFORE 4:00 PM

## 2024-02-27 DIAGNOSIS — C50912 Malignant neoplasm of unspecified site of left female breast: Secondary | ICD-10-CM | POA: Diagnosis not present

## 2024-03-04 ENCOUNTER — Ambulatory Visit (INDEPENDENT_AMBULATORY_CARE_PROVIDER_SITE_OTHER)

## 2024-03-04 DIAGNOSIS — Z7901 Long term (current) use of anticoagulants: Secondary | ICD-10-CM

## 2024-03-04 LAB — POCT INR: INR: 3.2 — AB (ref 2.0–3.0)

## 2024-03-04 NOTE — Patient Instructions (Addendum)
 Pre visit review using our clinic review tool, if applicable. No additional management support is needed unless otherwise documented below in the visit note.  Hold dose today and then continue 1 tablet daily except take 1 1/2 tablets on Fridays. Recheck in 4 weeks.

## 2024-03-04 NOTE — Progress Notes (Signed)
 Pt eating more fruit.  Hold dose today and then continue 1 tablet daily except take 1 1/2 tablets on Fridays. Recheck in 4 weeks.

## 2024-03-10 ENCOUNTER — Encounter: Payer: Self-pay | Admitting: Cardiology

## 2024-03-10 ENCOUNTER — Other Ambulatory Visit: Payer: Self-pay | Admitting: Internal Medicine

## 2024-03-10 NOTE — Telephone Encounter (Signed)
       Copied from CRM [id number]. Topic: Clinical - Prescription Issue >> Mar 10, 2024 10:34 AM Gustabo D wrote: Semaglutide  (RYBELSUS ) 7 MG TABS carvedilol  (COREG ) 12.5 MG tablet Patient wants her medications to be refilled she says the nurse or pcp need to contact her pharmacy to get the correct ones refilled - [facility] [place], [place] - [address] [facility] AT Portal to Registered [facility] [address] [place] [address] Phone: [phone] Fax: [phone] Hours: Not open 24 hours

## 2024-03-11 ENCOUNTER — Other Ambulatory Visit (HOSPITAL_COMMUNITY): Payer: Self-pay

## 2024-03-11 DIAGNOSIS — I5022 Chronic systolic (congestive) heart failure: Secondary | ICD-10-CM

## 2024-03-11 MED ORDER — CARVEDILOL 12.5 MG PO TABS: 12.5000 mg | ORAL_TABLET | Freq: Two times a day (BID) | ORAL | 3 refills | Status: AC

## 2024-03-11 MED ORDER — RYBELSUS 7 MG PO TABS
ORAL_TABLET | ORAL | 5 refills | Status: DC
Start: 2024-03-11 — End: 2024-07-21

## 2024-03-11 MED ORDER — ATORVASTATIN CALCIUM 40 MG PO TABS: 40.0000 mg | ORAL_TABLET | Freq: Every day | ORAL | 3 refills | Status: AC

## 2024-03-11 MED ORDER — SPIRONOLACTONE 25 MG PO TABS: 25.0000 mg | ORAL_TABLET | Freq: Every day | ORAL | 3 refills | Status: AC

## 2024-03-31 ENCOUNTER — Encounter: Payer: Self-pay | Admitting: Internal Medicine

## 2024-03-31 NOTE — Progress Notes (Unsigned)
 Subjective:    Patient ID: Anita Duke, female    DOB: 06-Nov-1940, 83 y.o.   MRN: [id number]     HPI Merna is here for follow up of her chronic medical problems.  She gets up and she feels a little off - her balance is off - lasts a second or 2 and goes away. It started about one month ago.  She has felt it with head movements.  No change in vision or nausea.  When she walks she feels a little off balance.    Medications and allergies reviewed with patient and updated if appropriate.  Current Outpatient Medications on File Prior to Visit  Medication Sig Dispense Refill   amoxicillin  (AMOXIL ) 500 MG capsule Take 1 capsule (500 mg total) by mouth 3 (three) times daily. 90 capsule 1   atorvastatin  (LIPITOR) 40 MG tablet Take 1 tablet (40 mg total) by mouth daily. 90 tablet 3   carvedilol  (COREG ) 12.5 MG tablet Take 1 tablet (12.5 mg total) by mouth 2 (two) times daily with a meal. 180 tablet 3   cetirizine (ZYRTEC) 10 MG tablet Take 10 mg by mouth daily.     cholecalciferol  (VITAMIN D ) 1000 UNITS tablet Take 1,000 Units by mouth daily.      cyanocobalamin  (VITAMIN B12) 500 MCG tablet Take 500 mcg by mouth daily.     fish oil-omega-3 fatty acids 1000 MG capsule Take 1 g by mouth 2 (two) times daily.     JARDIANCE  25 MG TABS tablet TAKE 1 TABLET DAILY 30 tablet 11   Polyethyl Glycol-Propyl Glycol (SYSTANE) 0.4-0.3 % SOLN Place 1 drop into both eyes daily.     sacubitril -valsartan  (ENTRESTO ) 24-26 MG TAKE 1 TABLET TWICE A DAY 60 tablet 11   Semaglutide  (RYBELSUS ) 7 MG TABS Take one tablet by mouth daily 30 tablet 5   sodium chloride  (OCEAN) 0.65 % SOLN nasal spray Place 1 spray into both nostrils 2 (two) times daily.     spironolactone  (ALDACTONE ) 25 MG tablet Take 1 tablet (25 mg total) by mouth daily. 90 tablet 3   tamoxifen  (NOLVADEX ) 20 MG tablet TAKE 1 TABLET DAILY 90 tablet 2   venlafaxine  XR (EFFEXOR -XR) 37.5 MG 24 hr capsule TAKE 1 CAPSULE DAILY 90 capsule 2    warfarin (JANTOVEN ) 2.5 MG tablet TAKE 1 TABLET DAILY EXCEPT TAKE 1 1/2 TABLETS ON FRIDAYS OR AS  DIRECTED BY ANTICOAGULATION CLINIC 105 tablet 3   No current facility-administered medications on file prior to visit.     Review of Systems  Constitutional:  Negative for fever.  HENT:  Negative for congestion, ear pain and sinus pressure.   Eyes:  Negative for visual disturbance.  Respiratory:  Negative for cough, shortness of breath and wheezing.   Cardiovascular:  Negative for chest pain, palpitations and leg swelling.  Gastrointestinal:  Negative for nausea.  Neurological:  Positive for dizziness (transient with getting up and head movements) and light-headedness. Negative for headaches.       Objective:   Vitals:   04/01/24 0845  BP: 132/76  Pulse: 74  Temp: 98 F (36.7 C)  SpO2: 95%   BP Readings from Last 3 Encounters:  04/01/24 132/76  02/06/24 118/62  01/10/24 (!) 150/66   Wt Readings from Last 3 Encounters:  04/01/24 146 lb 9.6 oz (66.5 kg)  02/06/24 143 lb 12.8 oz (65.2 kg)  01/10/24 145 lb 9.6 oz (66 kg)   Body mass index is 27.7 kg/m.  Physical Exam Constitutional:      General: She is not in acute distress.    Appearance: Normal appearance.  HENT:     Head: Normocephalic and atraumatic.  Eyes:     Conjunctiva/sclera: Conjunctivae normal.  Cardiovascular:     Rate and Rhythm: Normal rate and regular rhythm.     Heart sounds: Normal heart sounds.  Pulmonary:     Effort: Pulmonary effort is normal. No respiratory distress.     Breath sounds: Normal breath sounds. No wheezing.  Musculoskeletal:     Cervical back: Neck supple.     Right lower leg: No edema.     Left lower leg: No edema.  Lymphadenopathy:     Cervical: No cervical adenopathy.  Skin:    General: Skin is warm and dry.     Findings: No rash.  Neurological:     Mental Status: She is alert. Mental status is at baseline.  Psychiatric:        Mood and Affect: Mood normal.         Behavior: Behavior normal.       Diabetic Foot Exam - Simple   Simple Foot Form Diabetic Foot exam was performed with the following findings: Yes 04/01/2024  9:12 AM  Visual Inspection No deformities, no ulcerations, no other skin breakdown bilaterally: Yes Sensation Testing Intact to touch and monofilament testing bilaterally: Yes Pulse Check Posterior Tibialis and Dorsalis pulse intact bilaterally: Yes Comments      Lab Results  Component Value Date   WBC 5.3 01/10/2024   HGB 15.0 01/10/2024   HCT 44.9 01/10/2024   PLT 179 01/10/2024   GLUCOSE 112 (H) 01/10/2024   CHOL 120 10/02/2023   TRIG 85 10/02/2023   HDL 50 10/02/2023   LDLDIRECT 65.0 01/05/2016   LDLCALC 53 10/02/2023   ALT 11 01/10/2024   AST 19 01/10/2024   NA 140 01/10/2024   K 4.0 01/10/2024   CL 105 01/10/2024   CREATININE 0.68 01/10/2024   BUN 13 01/10/2024   CO2 29 01/10/2024   TSH 0.99 10/02/2022   INR 2.8 04/01/2024   HGBA1C 7.9 (H) 10/03/2023   MICROALBUR 0.6 08/25/2014     Assessment & Plan:    See Problem List for Assessment and Plan of chronic medical problems.

## 2024-03-31 NOTE — Patient Instructions (Addendum)
      Blood work was ordered.       Medications changes include :   None    A referral was ordered and someone will call you to schedule an appointment.     Return in about 6 months (around 10/02/2024) for Physical Exam.

## 2024-04-01 ENCOUNTER — Ambulatory Visit (INDEPENDENT_AMBULATORY_CARE_PROVIDER_SITE_OTHER)

## 2024-04-01 ENCOUNTER — Ambulatory Visit (INDEPENDENT_AMBULATORY_CARE_PROVIDER_SITE_OTHER): Payer: Medicare HMO | Admitting: Internal Medicine

## 2024-04-01 VITALS — BP 132/76 | HR 74 | Temp 98.0°F | Ht 61.0 in | Wt 146.6 lb

## 2024-04-01 DIAGNOSIS — E1142 Type 2 diabetes mellitus with diabetic polyneuropathy: Secondary | ICD-10-CM | POA: Diagnosis not present

## 2024-04-01 DIAGNOSIS — I2510 Atherosclerotic heart disease of native coronary artery without angina pectoris: Secondary | ICD-10-CM | POA: Diagnosis not present

## 2024-04-01 DIAGNOSIS — F3289 Other specified depressive episodes: Secondary | ICD-10-CM | POA: Diagnosis not present

## 2024-04-01 DIAGNOSIS — E1169 Type 2 diabetes mellitus with other specified complication: Secondary | ICD-10-CM | POA: Diagnosis not present

## 2024-04-01 DIAGNOSIS — M85852 Other specified disorders of bone density and structure, left thigh: Secondary | ICD-10-CM | POA: Diagnosis not present

## 2024-04-01 DIAGNOSIS — I4891 Unspecified atrial fibrillation: Secondary | ICD-10-CM | POA: Diagnosis not present

## 2024-04-01 DIAGNOSIS — R42 Dizziness and giddiness: Secondary | ICD-10-CM | POA: Diagnosis not present

## 2024-04-01 DIAGNOSIS — Z95811 Presence of heart assist device: Secondary | ICD-10-CM | POA: Insufficient documentation

## 2024-04-01 DIAGNOSIS — E538 Deficiency of other specified B group vitamins: Secondary | ICD-10-CM | POA: Diagnosis not present

## 2024-04-01 DIAGNOSIS — Z7901 Long term (current) use of anticoagulants: Secondary | ICD-10-CM

## 2024-04-01 DIAGNOSIS — I10 Essential (primary) hypertension: Secondary | ICD-10-CM | POA: Diagnosis not present

## 2024-04-01 DIAGNOSIS — I519 Heart disease, unspecified: Secondary | ICD-10-CM

## 2024-04-01 DIAGNOSIS — E782 Mixed hyperlipidemia: Secondary | ICD-10-CM | POA: Diagnosis not present

## 2024-04-01 DIAGNOSIS — E559 Vitamin D deficiency, unspecified: Secondary | ICD-10-CM | POA: Diagnosis not present

## 2024-04-01 LAB — POCT INR: INR: 2.8 (ref 2.0–3.0)

## 2024-04-01 NOTE — Assessment & Plan Note (Signed)
 Chronic Follow-up with cardiology No symptoms consistent with angina On warfarin, Coreg, Jardiance, spironolactone, Entresto, atorvastatin

## 2024-04-01 NOTE — Assessment & Plan Note (Signed)
 Chronic Taking vitamin D daily Check vitamin D level

## 2024-04-01 NOTE — Assessment & Plan Note (Signed)
 Chronic Blood pressure well controlled Continue Coreg 12.5 mg twice daily, Entresto 24-26 mg twice daily, spironolactone 25 mg daily

## 2024-04-01 NOTE — Patient Instructions (Addendum)
 Pre visit review using our clinic review tool, if applicable. No additional management support is needed unless otherwise documented below in the visit note.  Continue 1 tablet daily except take 1 1/2 tablets on Fridays. Recheck in 4 weeks.

## 2024-04-01 NOTE — Assessment & Plan Note (Signed)
 Chronic Following with cardiology On warfarin which is monitored at our Coumadin clinic Rate controlled with carvedilol 12.5 mg twice daily

## 2024-04-01 NOTE — Assessment & Plan Note (Signed)
Chronic Taking B12 supplementation Check B12 level

## 2024-04-01 NOTE — Assessment & Plan Note (Signed)
Chronic Not on any medication at this time Follows with podiatry

## 2024-04-01 NOTE — Assessment & Plan Note (Signed)
 Chronic Regular exercise and healthy diet encouraged LDL at goal Continue atorvastatin  40 mg daily Check lipid panel  Lab Results  Component Value Date   LDLCALC 53 10/02/2023

## 2024-04-01 NOTE — Assessment & Plan Note (Signed)
 Chronic Follows with cardiology Appears euvolemic Continue Sherryll Burger, Jardiance and spironolactone

## 2024-04-01 NOTE — Assessment & Plan Note (Signed)
 Chronic Controlled, Stable Continue venlafaxine 37.5 mg daily

## 2024-04-01 NOTE — Assessment & Plan Note (Signed)
 Chronic DEXA up to date Continue regular walking Continue vitamin D supplementation Start calcium 5-600 mg once daily, eat a high calcium diet On tamoxifen

## 2024-04-01 NOTE — Progress Notes (Signed)
 Pt also has PCP apt today.  Continue 1 tablet daily except take 1 1/2 tablets on Fridays. Recheck in 4 weeks.

## 2024-04-01 NOTE — Assessment & Plan Note (Signed)
 Chronic Lab Results  Component Value Date   HGBA1C 7.9 (H) 10/03/2023   Controlled Continue jardiance  25 mg daily, rybelsus  7 mg daily Check A1c today , urine albumin /creatinine ratio Continue regular walking, diabetic diet

## 2024-04-01 NOTE — Assessment & Plan Note (Signed)
 Acute Started one month ago Has transient balance issues/dizziness with standing up, head movements No cold symptoms, no stroke like symptoms and on coumadin  Likely BPPV If it does not improve - consider PT

## 2024-04-29 ENCOUNTER — Ambulatory Visit (INDEPENDENT_AMBULATORY_CARE_PROVIDER_SITE_OTHER)

## 2024-04-29 DIAGNOSIS — Z7901 Long term (current) use of anticoagulants: Secondary | ICD-10-CM | POA: Diagnosis not present

## 2024-04-29 LAB — POCT INR: INR: 2.5 (ref 2.0–3.0)

## 2024-04-29 NOTE — Progress Notes (Signed)
 Continue 1 tablet daily except take 1 1/2 tablets on Fridays. Recheck in 6 weeks.

## 2024-04-29 NOTE — Patient Instructions (Addendum)
 Pre visit review using our clinic review tool, if applicable. No additional management support is needed unless otherwise documented below in the visit note.  Continue 1 tablet daily except take 1 1/2 tablets on Fridays. Recheck in 6 weeks.

## 2024-05-03 ENCOUNTER — Other Ambulatory Visit: Payer: Self-pay | Admitting: Internal Medicine

## 2024-05-03 DIAGNOSIS — Z7901 Long term (current) use of anticoagulants: Secondary | ICD-10-CM

## 2024-05-14 DIAGNOSIS — Z6826 Body mass index (BMI) 26.0-26.9, adult: Secondary | ICD-10-CM | POA: Diagnosis not present

## 2024-05-14 DIAGNOSIS — M5416 Radiculopathy, lumbar region: Secondary | ICD-10-CM | POA: Diagnosis not present

## 2024-06-09 ENCOUNTER — Other Ambulatory Visit: Payer: Self-pay

## 2024-06-10 ENCOUNTER — Ambulatory Visit

## 2024-06-11 MED ORDER — SACUBITRIL-VALSARTAN 24-26 MG PO TABS: 1.0000 | ORAL_TABLET | Freq: Two times a day (BID) | ORAL | 11 refills | Status: DC

## 2024-06-11 NOTE — Telephone Encounter (Signed)
 This is a CHF pt

## 2024-06-13 ENCOUNTER — Ambulatory Visit

## 2024-06-13 DIAGNOSIS — Z7901 Long term (current) use of anticoagulants: Secondary | ICD-10-CM

## 2024-06-13 LAB — POCT INR: INR: 1.9 — AB (ref 2.0–3.0)

## 2024-06-13 NOTE — Patient Instructions (Addendum)
 Pre visit review using our clinic review tool, if applicable. No additional management support is needed unless otherwise documented below in the visit note.  Increase dose today to take 2 tablets and then continue 1 tablet daily except take 1 1/2 tablets on Fridays. Recheck in 3 weeks.

## 2024-06-13 NOTE — Progress Notes (Signed)
 Increase dose today to take 2 tablets and then continue 1 tablet daily except take 1 1/2 tablets on Fridays. Recheck in 3 weeks.

## 2024-06-27 ENCOUNTER — Other Ambulatory Visit (HOSPITAL_COMMUNITY): Payer: Self-pay

## 2024-06-27 DIAGNOSIS — I5022 Chronic systolic (congestive) heart failure: Secondary | ICD-10-CM

## 2024-06-27 MED ORDER — SACUBITRIL-VALSARTAN 24-26 MG PO TABS: 1.0000 | ORAL_TABLET | Freq: Two times a day (BID) | ORAL | 11 refills | Status: AC

## 2024-07-04 ENCOUNTER — Ambulatory Visit (INDEPENDENT_AMBULATORY_CARE_PROVIDER_SITE_OTHER)

## 2024-07-04 DIAGNOSIS — Z7901 Long term (current) use of anticoagulants: Secondary | ICD-10-CM

## 2024-07-04 LAB — POCT INR: INR: 2.2 (ref 2.0–3.0)

## 2024-07-04 NOTE — Progress Notes (Signed)
 Continue 1 tablet daily except take 1 1/2 tablets on Fridays. Recheck in 5 weeks.

## 2024-07-04 NOTE — Patient Instructions (Addendum)
 Pre visit review using our clinic review tool, if applicable. No additional management support is needed unless otherwise documented below in the visit note.  Continue 1 tablet daily except take 1 1/2 tablets on Fridays. Recheck in 5 weeks.

## 2024-07-15 ENCOUNTER — Other Ambulatory Visit: Payer: Self-pay | Admitting: Nurse Practitioner

## 2024-07-15 DIAGNOSIS — C50112 Malignant neoplasm of central portion of left female breast: Secondary | ICD-10-CM

## 2024-07-15 NOTE — Assessment & Plan Note (Signed)
 stage IA, pT1cN0, ER+/PR+/HER2-, Grade 2  -presented with palpable left breast lump. 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 Otherwise, no mutations. -she underwent lumpectomy on 05/26/21 with Dr. Aron showing: IDC, 1.5 cm, grade 2; DCIS, intermediate grade; resection margins negative. -she began tamoxifen  on 09/13/21. She reports weight gain and hot flashes. -- Diagnostic mammogram done 12/14/2023 showed benign results.  Breast density category C.  Repeat mammogram due in April 2026. --DEXA scan on 04/09/2023 which showed low bone density.  10-year FRAX score was  13.5% for any major fracture and 3.6% for hip fracture.  --continue tamoxifen  20 mg daily.  --labs with follow up in 6 months, sooner if needed

## 2024-07-15 NOTE — Progress Notes (Unsigned)
 Patient Care Team: Geofm Glade PARAS, MD as PCP - General (Internal Medicine) Rolan Ezra RAMAN, MD as PCP - Cardiology (Cardiology) Inocencio Soyla Lunger, MD as PCP - Electrophysiology (Cardiology) Micky Reyes BIRCH, MD as Consulting Physician (Cardiology) Debrah Lamar BIRCH, MD (Inactive) as Consulting Physician (Gastroenterology) Georjean Darice HERO, MD as Consulting Physician (Neurology) Lanny Callander, MD as Consulting Physician (Hematology) Aron Shoulders, MD as Consulting Physician (General Surgery) Tyree Nanetta SAILOR, RN as Oncology Nurse Navigator Burton, Lacie K, NP as Nurse Practitioner (Nurse Practitioner) Leslee Reusing, MD as Consulting Physician (Ophthalmology)  Clinic Day:  07/16/2024  Referring physician: Geofm Glade PARAS, MD  ASSESSMENT & PLAN:   Assessment & Plan: 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 Otherwise, no mutations. -she underwent lumpectomy on 05/26/21 with Dr. Aron showing: IDC, 1.5 cm, grade 2; DCIS, intermediate grade; resection margins negative. -she began tamoxifen  on 09/13/21. She reports weight gain and hot flashes. -- Diagnostic mammogram done 12/14/2023 showed benign results.  Breast density category C.  Repeat mammogram due in April 2026. --DEXA scan on 04/09/2023 which showed low bone density.  10-year FRAX score was  13.5% for any major fracture and 3.6% for hip fracture.  --continue tamoxifen  20 mg daily.  --labs with follow up in 6 months, sooner if needed    Low back pain with sciatica Patient did have back surgery approximately 6 years ago which alleviated pain on right side.  She recently saw her neurosurgeon and was told she slipped her L4 vertebrae.  Transforaminal injections were offered.  She declined, as pain had  improved.  Will return to her neurosurgeon if pain gets worse or treatment with these injections.  Left breast cancer Amoxicillin  20 mg daily.  Tolerating well with expected hot flashes and night sweats which are manageable.  Diagnostic mammogram from April 2025 was benign.  New diagnostic mammogram ordered for April 2026.  Breast exam benign and unchanged from 01/2024.  Plan Labs reviewed. -Unremarkable CBC and CMP unremarkable. Diagnostic mammogram ordered for April 2026. Continue tamoxifen  20 mg daily. Plan for labs and follow-up in 6 months, sooner if needed.  The patient understands the plans discussed today and is in agreement with them.  She knows to contact our office if she develops concerns prior to her next appointment.  I provided 25 minutes of face-to-face time during this encounter and > 50% was spent counseling as documented under my assessment and plan.    Powell FORBES Lessen, NP  [facility] - A DEPT OF [facility] [address] [place] [address] Dept: [phone] Dept Fax: [phone]   Orders Placed This Encounter  Procedures   MM 3D DIAGNOSTIC MAMMOGRAM BILATERAL BREAST    Standing Status:   Future    Expected Date:   12/15/2024    Expiration Date:   07/16/2025    Reason for Exam (SYMPTOM  OR DIAGNOSIS REQUIRED):   breast cancer follow up    Preferred imaging location?:   [facility]      CHIEF COMPLAINT:  CC: Left breast cancer, ER +  Current Treatment: Tamoxifen  20 mg (started 09/13/2021)  INTERVAL HISTORY:  Anita Duke is here today for repeat clinical assessment.  She last saw me on 01/10/2024.  Most recent  diagnostic mammogram done 12/14/2023.  She has breast density category C.  Overall, results were benign.  She will be due for diagnostic mammogram in 12/2024.  She did have DEXA scan on 04/09/2023 which showed low bone density.  10-year FRAX score was  13.5% for any major fracture and 3.6%  for hip fracture.  She continues tamoxifen  20 mg daily.  Tolerating well overall.  Denies changes, new lumps, or masses in either breast.  Denies changes in any skin lesions.  She denies chest pain, chest pressure, or shortness of breath. She denies headaches or visual disturbances. She denies abdominal pain, nausea, vomiting, or changes in bowel or bladder habits.  She denies fevers or chills. Her appetite is good. Her weight has increased 3 pounds over last 6 months.  I have reviewed the past medical history, past surgical history, social history and family history with the patient and they are unchanged from previous note.  ALLERGIES:  is allergic to demerol [meperidine], morphine , januvia  [sitagliptin ], metformin  and related, and tetanus toxoid.  MEDICATIONS:  Current Outpatient Medications  Medication Sig Dispense Refill   amoxicillin  (AMOXIL ) 500 MG capsule Take 1 capsule (500 mg total) by mouth 3 (three) times daily. 90 capsule 1   atorvastatin  (LIPITOR) 40 MG tablet Take 1 tablet (40 mg total) by mouth daily. 90 tablet 3   carvedilol  (COREG ) 12.5 MG tablet Take 1 tablet (12.5 mg total) by mouth 2 (two) times daily with a meal. 180 tablet 3   cetirizine (ZYRTEC) 10 MG tablet Take 10 mg by mouth daily.     cholecalciferol  (VITAMIN D ) 1000 UNITS tablet Take 1,000 Units by mouth daily.      cyanocobalamin  (VITAMIN B12) 500 MCG tablet Take 500 mcg by mouth daily.     fish oil-omega-3 fatty acids 1000 MG capsule Take 1 g by mouth 2 (two) times daily.     JARDIANCE  25 MG TABS tablet TAKE 1 TABLET DAILY 30 tablet 11   Polyethyl Glycol-Propyl Glycol (SYSTANE) 0.4-0.3 % SOLN Place 1 drop into both eyes daily.     sacubitril -valsartan  (ENTRESTO ) 24-26 MG Take 1 tablet by mouth 2 (two) times daily. 60 tablet 11   Semaglutide  (RYBELSUS ) 7 MG TABS Take one tablet by mouth daily 30 tablet 5   sodium chloride  (OCEAN) 0.65 % SOLN nasal spray Place 1 spray into both nostrils 2 (two) times daily.      spironolactone  (ALDACTONE ) 25 MG tablet Take 1 tablet (25 mg total) by mouth daily. 90 tablet 3   tamoxifen  (NOLVADEX ) 20 MG tablet TAKE 1 TABLET DAILY 90 tablet 2   venlafaxine  XR (EFFEXOR -XR) 37.5 MG 24 hr capsule TAKE 1 CAPSULE DAILY 90 capsule 2   warfarin (JANTOVEN ) 2.5 MG tablet TAKE 1 TABLET DAILY EXCEPT TAKE 1 AND 1/2 TABLETS ON  FRIDAYS OR AS DIRECTED BY  ANTICOAGULATION CLINIC 105 tablet 3   No current facility-administered medications for this visit.    HISTORY OF PRESENT ILLNESS:   Oncology History Overview Note  Cancer Staging Malignant neoplasm of central portion of left breast [facility]) Staging form: Breast, AJCC 8th Edition - Clinical stage from 04/11/2021: Stage IA (cT1c, cN0, cM0, G2, ER+, PR+, HER2-) - Signed by Lanny Callander, MD on 04/27/2021    Malignant neoplasm of central portion of left breast (HCC)  04/09/2021 Mammogram   Bilateral Diagnostic, and Left Breast US   FINDINGS: Spiculated mass in the left breast containing calcifications at 3 o'clock, 6 cm from the nipple measuring 11 x 9 x 9  mm. No axillary adenopathy.     04/11/2021 Cancer Staging   Staging form: Breast, AJCC 8th Edition - Clinical stage from 04/11/2021: Stage IA (cT1c, cN0, cM0, G2, ER+, PR+, HER2-) - Signed by Lanny Callander, MD on 04/27/2021 Stage prefix: Initial diagnosis Histologic grading system: 3 grade system   04/11/2021 Pathology Results   Diagnosis Breast, left, needle core biopsy, 3:00 - INVASIVE DUCTAL CARCINOMA, SEE COMMENT. - DUCTAL CARCINOMA IN SITU. Microscopic Comment The carcinoma appears grade 2.  PROGNOSTIC INDICATORS Results: IMMUNOHISTOCHEMICAL AND MORPHOMETRIC ANALYSIS PERFORMED MANUALLY The tumor cells are NEGATIVE for Her2 (1+). 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  No other pathogenic variants detected in [facility] CancerNext-Expanded +RNAinsight Panel. The report date is May 17, 2021.    The CancerNext-Expanded gene panel offered by [facility] and includes sequencing, rearrangement, and RNA analysis for the following 77 genes: AIP, ALK, APC, ATM, AXIN2, BAP1, BARD1, BLM, BMPR1A, BRCA1, BRCA2, BRIP1, CDC73, CDH1, CDK4, CDKN1B, CDKN2A, CHEK2, CTNNA1, DICER1, FANCC, FH, FLCN, GALNT12, KIF1B, LZTR1, MAX, MEN1, MET, MLH1, MSH2, MSH3, MSH6, MUTYH, NBN, NF1, NF2, NTHL1, PALB2, PHOX2B, PMS2, POT1, PRKAR1A, PTCH1, PTEN, RAD51C, RAD51D, RB1, RECQL, RET, SDHA, SDHAF2, SDHB, SDHC, SDHD, SMAD4, SMARCA4, SMARCB1, SMARCE1, STK11, SUFU, TMEM127, TP53, TSC1, TSC2, VHL and XRCC2 (sequencing and deletion/duplication); EGFR, EGLN1, HOXB13, KIT, MITF, PDGFRA, POLD1, and POLE (sequencing only); EPCAM and GREM1 (deletion/duplication only).    05/26/2021 Definitive Surgery   FINAL MICROSCOPIC DIAGNOSIS:   A. BREAST, LEFT, LUMPECTOMY:  - Invasive ductal carcinoma, 1.5 cm, grade 2  - Ductal carcinoma in situ, intermediate grade  - Resection margins are negative for carcinoma; closest is the posterior margin at 0.3 cm  - Patchy atypical ductal hyperplasia  - Fibrocystic change with usual ductal hyperplasia, apocrine metaplasia and calcifications  - Biopsy site changes  - See oncology table    05/26/2021 Cancer Staging   Staging form: Breast, AJCC 8th Edition - Pathologic stage from 05/26/2021: Stage Unknown (pT1c, pNX, cM0, G2, ER+, PR+, HER2-) - Signed by Lanny Callander, MD on 06/30/2021 Stage prefix: Initial diagnosis Multigene prognostic tests performed: None Histologic grading system: 3 grade system Residual tumor (R): R0 - None   10/04/2021  Survivorship   SCP delivered in person by Lacie Burton, NP   04/12/2023 Imaging   Bone density test Assessment: By the [facility] Criteria for diagnosis based on bone density, this patient has Low Bone Density  FRAX 10-year fracture risk calculator: 13.5 % for any major fracture and 3.6 % for hip fracture. Pharmacologic therapy is recommended if 10 year fracture risk is >20% for any major osteoporotic fracture or >3% for hip fracture.   L2,L3  vertebrae had to be excluded from analysis      12/14/2023 Mammogram   Bilateral 3D diagnostic mammogram  MPRESSION: No evidence of malignancy in either breast.  RECOMMENDATION: Per protocol, as the patient is now 2 or more years status post lumpectomy, she is eligible for annual screening or diagnostic mammography in 1 year. In our discussion, patient elects bilateral diagnostic mammogram in 1 year.  BI-RADS CATEGORY  2: Benign.       REVIEW OF SYSTEMS:  Constitutional: Denies fevers, chills or abnormal weight loss Eyes: Denies blurriness of vision Ears, nose, mouth, throat, and face: Denies mucositis or sore throat Respiratory: Denies cough, dyspnea or wheezes Cardiovascular: Denies palpitation, chest discomfort or lower extremity swelling Gastrointestinal:  Denies nausea, heartburn or change in bowel habits Skin: Denies abnormal skin rashes Lymphatics: Denies new lymphadenopathy or easy bruising Neurological:Denies numbness, tingling or new weaknesses Behavioral/Psych: Mood is stable, no new changes  All other systems were reviewed with the patient and are negative.   VITALS:   Today's Vitals   07/16/24 1032  BP: (!) 152/67  Pulse: 70  Resp: 17  Temp: (!) 97.5 F (36.4 C)  SpO2: 98%  Weight: 149 lb 3.2 oz (67.7 kg)  Height: 5' 1 (1.549 m)   Body mass index is 28.19 kg/m.   Wt Readings from Last 3 Encounters:  07/16/24 149 lb 3.2 oz (67.7 kg)  04/01/24 146 lb 9.6 oz (66.5 kg)  02/06/24 143 lb 12.8 oz (65.2 kg)    Body mass  index is 28.19 kg/m.  Performance status (ECOG): 1 - Symptomatic but completely ambulatory  PHYSICAL EXAM:   GENERAL:alert, no distress and comfortable SKIN: skin color, texture, turgor are normal, no rashes or significant lesions EYES: normal, Conjunctiva are pink and non-injected, sclera clear OROPHARYNX:no exudate, no erythema and lips, buccal mucosa, and tongue normal  NECK: supple, thyroid  normal size, non-tender, without nodularity LYMPH:  no palpable lymphadenopathy in the cervical, axillary or inguinal LUNGS: clear to auscultation and percussion with normal breathing effort HEART: regular rate & rhythm and no murmurs and no lower extremity edema ABDOMEN:abdomen soft, non-tender and normal bowel sounds Musculoskeletal:no cyanosis of digits and no clubbing  NEURO: alert & oriented x 3 with fluent speech, no focal motor/sensory deficits BREAST: There are well healed surgical lumpectomy scars on left breast. Fibrous tissue palpable along the scar. There are no palpable masses or lumps. There is no nipple inversion or nipple discharge. There is no axillary lymphadenopathy on the left. There are no palpable masses or lumps in the right breast. There is no nipple inversion or nipple discharge. There is no axillary lymphadenopathy on the right. Numerous skin lesions across the chest and on bilateral breasts that are unchanged.   LABORATORY DATA:  I have reviewed the data as listed    Component Value Date/Time   NA 143 07/16/2024 0949   NA 144 06/19/2022 1435   NA 143 01/20/2014 1453   K 3.7 07/16/2024 0949   K 4.3 01/20/2014 1453   CL 109 07/16/2024 0949   CL 106 12/30/2012 1511   CO2 29 07/16/2024 0949   CO2 27 01/20/2014 1453   GLUCOSE 125 (H) 07/16/2024 0949   GLUCOSE 150 (H) 01/20/2014 1453   GLUCOSE 104 (H) 12/30/2012 1511   BUN 11 07/16/2024 0949   BUN 13 06/19/2022 1435   BUN 13.8 01/20/2014 1453   CREATININE 0.62 07/16/2024 0949   CREATININE 0.64 03/16/2020 0957    CREATININE 0.8 01/20/2014 1453   CALCIUM  9.1 07/16/2024 0949   CALCIUM  9.5 01/20/2014 1453   PROT 6.7 07/16/2024 0949   PROT 6.5 01/20/2014 1453   ALBUMIN  4.1 07/16/2024 0949   ALBUMIN  3.6 01/20/2014 1453   AST 16 07/16/2024 0949   AST 19 01/20/2014 1453   ALT 10 07/16/2024 0949   ALT 20 01/20/2014 1453   ALKPHOS 59 07/16/2024 0949   ALKPHOS 80 01/20/2014 1453   BILITOT 0.7 07/16/2024 0949   BILITOT 0.82 01/20/2014 1453  GFRNONAA >60 07/16/2024 0949   GFRNONAA >89 06/11/2015 1101   GFRAA >60 09/09/2019 1030   GFRAA >89 06/11/2015 1101    Lab Results  Component Value Date   WBC 4.5 07/16/2024   NEUTROABS 2.6 07/16/2024   HGB 14.3 07/16/2024   HCT 43.8 07/16/2024   MCV 93.4 07/16/2024   PLT 183 07/16/2024

## 2024-07-16 ENCOUNTER — Inpatient Hospital Stay: Admitting: Nurse Practitioner

## 2024-07-16 ENCOUNTER — Encounter: Payer: Self-pay | Admitting: Nurse Practitioner

## 2024-07-16 ENCOUNTER — Inpatient Hospital Stay: Attending: Nurse Practitioner

## 2024-07-16 VITALS — BP 152/67 | HR 70 | Temp 97.5°F | Resp 17 | Ht 61.0 in | Wt 149.2 lb

## 2024-07-16 DIAGNOSIS — C50112 Malignant neoplasm of central portion of left female breast: Secondary | ICD-10-CM

## 2024-07-16 DIAGNOSIS — Z79899 Other long term (current) drug therapy: Secondary | ICD-10-CM | POA: Insufficient documentation

## 2024-07-16 DIAGNOSIS — Z170 Estrogen receptor positive status [ER+]: Secondary | ICD-10-CM | POA: Diagnosis not present

## 2024-07-16 DIAGNOSIS — Z1732 Human epidermal growth factor receptor 2 negative status: Secondary | ICD-10-CM | POA: Insufficient documentation

## 2024-07-16 DIAGNOSIS — N951 Menopausal and female climacteric states: Secondary | ICD-10-CM | POA: Insufficient documentation

## 2024-07-16 DIAGNOSIS — M5440 Lumbago with sciatica, unspecified side: Secondary | ICD-10-CM | POA: Diagnosis not present

## 2024-07-16 DIAGNOSIS — Z1721 Progesterone receptor positive status: Secondary | ICD-10-CM | POA: Insufficient documentation

## 2024-07-16 LAB — CMP (CANCER CENTER ONLY)
ALT: 10 U/L (ref 0–44)
AST: 16 U/L (ref 15–41)
Albumin: 4.1 g/dL (ref 3.5–5.0)
Alkaline Phosphatase: 59 U/L (ref 38–126)
Anion gap: 5 (ref 5–15)
BUN: 11 mg/dL (ref 8–23)
CO2: 29 mmol/L (ref 22–32)
Calcium: 9.1 mg/dL (ref 8.9–10.3)
Chloride: 109 mmol/L (ref 98–111)
Creatinine: 0.62 mg/dL (ref 0.44–1.00)
GFR, Estimated: >60 mL/min (ref >60)
Glucose, Bld: 125 mg/dL — ABNORMAL HIGH (ref 70–99)
Potassium: 3.7 mmol/L (ref 3.5–5.1)
Sodium: 143 mmol/L (ref 135–145)
Total Bilirubin: 0.7 mg/dL (ref 0.0–1.2)
Total Protein: 6.7 g/dL (ref 6.5–8.1)

## 2024-07-16 LAB — CBC WITH DIFFERENTIAL (CANCER CENTER ONLY)
Abs Immature Granulocytes: 0.01 K/uL (ref 0.00–0.07)
Basophils Absolute: 0.1 K/uL (ref 0.0–0.1)
Basophils Relative: 1 %
Eosinophils Absolute: 0.1 K/uL (ref 0.0–0.5)
Eosinophils Relative: 2 %
HCT: 43.8 % (ref 36.0–46.0)
Hemoglobin: 14.3 g/dL (ref 12.0–15.0)
Immature Granulocytes: 0 %
Lymphocytes Relative: 26 %
Lymphs Abs: 1.2 K/uL (ref 0.7–4.0)
MCH: 30.5 pg (ref 26.0–34.0)
MCHC: 32.6 g/dL (ref 30.0–36.0)
MCV: 93.4 fL (ref 80.0–100.0)
Monocytes Absolute: 0.6 K/uL (ref 0.1–1.0)
Monocytes Relative: 13 %
Neutro Abs: 2.6 K/uL (ref 1.7–7.7)
Neutrophils Relative %: 58 %
Platelet Count: 183 K/uL (ref 150–400)
RBC: 4.69 MIL/uL (ref 3.87–5.11)
RDW: 13.4 % (ref 11.5–15.5)
WBC Count: 4.5 K/uL (ref 4.0–10.5)
nRBC: 0 % (ref 0.0–0.2)

## 2024-07-19 ENCOUNTER — Other Ambulatory Visit: Payer: Self-pay | Admitting: Internal Medicine

## 2024-08-06 DIAGNOSIS — Z961 Presence of intraocular lens: Secondary | ICD-10-CM | POA: Diagnosis not present

## 2024-08-06 DIAGNOSIS — H35361 Drusen (degenerative) of macula, right eye: Secondary | ICD-10-CM | POA: Diagnosis not present

## 2024-08-06 DIAGNOSIS — H524 Presbyopia: Secondary | ICD-10-CM | POA: Diagnosis not present

## 2024-08-06 DIAGNOSIS — E119 Type 2 diabetes mellitus without complications: Secondary | ICD-10-CM | POA: Diagnosis not present

## 2024-08-06 DIAGNOSIS — H5211 Myopia, right eye: Secondary | ICD-10-CM | POA: Diagnosis not present

## 2024-08-06 LAB — OPHTHALMOLOGY REPORT-SCANNED

## 2024-08-07 ENCOUNTER — Telehealth (HOSPITAL_COMMUNITY): Payer: Self-pay | Admitting: Cardiology

## 2024-08-07 NOTE — Telephone Encounter (Signed)
 Called to confirm/remind patient of their appointment at the [facility] on 08/07/2024.   Appointment:   [x] Confirmed  [] Left mess   [] No answer/No voice mail  [] VM Full/unable to leave message  [] Phone not in service  Patient reminded to bring all medications and/or complete list.  Confirmed patient has transportation. Gave directions, instructed to utilize valet parking.

## 2024-08-08 ENCOUNTER — Ambulatory Visit (HOSPITAL_COMMUNITY): Payer: Self-pay | Admitting: Cardiology

## 2024-08-08 ENCOUNTER — Ambulatory Visit (HOSPITAL_COMMUNITY)
Admission: RE | Admit: 2024-08-08 | Discharge: 2024-08-08 | Disposition: A | Source: Ambulatory Visit | Attending: Cardiology | Admitting: Cardiology

## 2024-08-08 VITALS — BP 118/85 | HR 70 | Wt 147.0 lb

## 2024-08-08 DIAGNOSIS — I5022 Chronic systolic (congestive) heart failure: Secondary | ICD-10-CM | POA: Diagnosis not present

## 2024-08-08 DIAGNOSIS — I252 Old myocardial infarction: Secondary | ICD-10-CM | POA: Diagnosis not present

## 2024-08-08 DIAGNOSIS — I272 Pulmonary hypertension, unspecified: Secondary | ICD-10-CM | POA: Diagnosis not present

## 2024-08-08 DIAGNOSIS — I4821 Permanent atrial fibrillation: Secondary | ICD-10-CM | POA: Diagnosis not present

## 2024-08-08 DIAGNOSIS — Z950 Presence of cardiac pacemaker: Secondary | ICD-10-CM | POA: Diagnosis not present

## 2024-08-08 DIAGNOSIS — I2510 Atherosclerotic heart disease of native coronary artery without angina pectoris: Secondary | ICD-10-CM | POA: Diagnosis not present

## 2024-08-08 DIAGNOSIS — Z7901 Long term (current) use of anticoagulants: Secondary | ICD-10-CM | POA: Diagnosis not present

## 2024-08-08 DIAGNOSIS — I519 Heart disease, unspecified: Secondary | ICD-10-CM

## 2024-08-08 DIAGNOSIS — M545 Low back pain, unspecified: Secondary | ICD-10-CM | POA: Diagnosis not present

## 2024-08-08 DIAGNOSIS — Z7984 Long term (current) use of oral hypoglycemic drugs: Secondary | ICD-10-CM | POA: Diagnosis not present

## 2024-08-08 DIAGNOSIS — R7989 Other specified abnormal findings of blood chemistry: Secondary | ICD-10-CM | POA: Diagnosis not present

## 2024-08-08 DIAGNOSIS — I081 Rheumatic disorders of both mitral and tricuspid valves: Secondary | ICD-10-CM | POA: Diagnosis not present

## 2024-08-08 DIAGNOSIS — I442 Atrioventricular block, complete: Secondary | ICD-10-CM | POA: Diagnosis not present

## 2024-08-08 DIAGNOSIS — Z79899 Other long term (current) drug therapy: Secondary | ICD-10-CM | POA: Diagnosis not present

## 2024-08-08 DIAGNOSIS — Z952 Presence of prosthetic heart valve: Secondary | ICD-10-CM | POA: Diagnosis not present

## 2024-08-08 DIAGNOSIS — I428 Other cardiomyopathies: Secondary | ICD-10-CM | POA: Diagnosis not present

## 2024-08-08 LAB — BASIC METABOLIC PANEL WITH GFR
Anion gap: 6 (ref 5–15)
BUN: 13 mg/dL (ref 8–23)
CO2: 27 mmol/L (ref 22–32)
Calcium: 8.9 mg/dL (ref 8.9–10.3)
Chloride: 107 mmol/L (ref 98–111)
Creatinine, Ser: 0.6 mg/dL (ref 0.44–1.00)
GFR, Estimated: >60 mL/min (ref >60)
Glucose, Bld: 139 mg/dL — ABNORMAL HIGH (ref 70–99)
Potassium: 3.7 mmol/L (ref 3.5–5.1)
Sodium: 140 mmol/L (ref 135–145)

## 2024-08-08 LAB — LIPID PANEL
Cholesterol: 118 mg/dL (ref 0–200)
HDL: 55 mg/dL (ref >40)
LDL Cholesterol: 48 mg/dL (ref 0–99)
Total CHOL/HDL Ratio: 2.1 ratio
Triglycerides: 77 mg/dL (ref <150)
VLDL: 15 mg/dL (ref 0–40)

## 2024-08-08 LAB — BRAIN NATRIURETIC PEPTIDE: B Natriuretic Peptide: 103.8 pg/mL — ABNORMAL HIGH (ref 0.0–100.0)

## 2024-08-08 NOTE — Patient Instructions (Signed)
 There has been no changes to your medications.  Labs done today, your results will be available in MyChart, we will contact you for abnormal readings.  Your physician has requested that you have an echocardiogram. Echocardiography is a painless test that uses sound waves to create images of your heart. It provides your doctor with information about the size and shape of your heart and how well your heart's chambers and valves are working. This procedure takes approximately one hour. There are no restrictions for this procedure. Please do NOT wear cologne, perfume, aftershave, or lotions (deodorant is allowed). Please arrive 15 minutes prior to your appointment time.  Please note: We ask at that you not bring children with you during ultrasound (echo/ vascular) testing. Due to room size and safety concerns, children are not allowed in the ultrasound rooms during exams. Our front office staff cannot provide observation of children in our lobby area while testing is being conducted. An adult accompanying a patient to their appointment will only be allowed in the ultrasound room at the discretion of the ultrasound technician under special circumstances. We apologize for any inconvenience.  Your physician recommends that you schedule a follow-up appointment in: 4 months.  If you have any questions or concerns before your next appointment please send us  a message through [place] or call our office at [phone].    TO LEAVE A MESSAGE FOR THE NURSE SELECT OPTION 2, PLEASE LEAVE A MESSAGE INCLUDING: YOUR NAME DATE OF BIRTH CALL BACK NUMBER REASON FOR CALL**this is important as we prioritize the call backs  YOU WILL RECEIVE A CALL BACK THE SAME DAY AS LONG AS YOU CALL BEFORE 4:00 PM  At the [facility], you and your health needs are our priority. As part of our continuing mission to provide you with exceptional heart care, we have created designated Provider Care Teams. These Care  Teams include your primary Cardiologist (physician) and Advanced Practice Providers (APPs- Physician Assistants and Nurse Practitioners) who all work together to provide you with the care you need, when you need it.   You may see any of the following providers on your designated Care Team at your next follow up: Dr Toribio Fuel Dr Ezra Shuck Dr. Morene Brownie Greig Mosses, NP Caffie Shed, [place] [facility] [place], [place] Beckey Coe, NP Jordan Lee, NP Ellouise Class, NP Tinnie Redman, PharmD Jaun Bash, PharmD   Please be sure to bring in all your medications bottles to every appointment.    Thank you for choosing [facility]

## 2024-08-09 NOTE — Progress Notes (Signed)
 Advanced Heart Failure Clinic Note    Primary Care: Dr. Glade Hope HF Cardiologist: Dr. Rolan  Chief complaint: CHF  HPI:  Anita Duke is a 83 y.o. with a past medical history of pulmonary HTN, mitral regurgitation s/p mitral valve repair, MAZE procedure at the [facility] in 2001, at that time she also had a LIMA to LAD graft placed. Also with history of permanent Afib on warfarin s/p AVN ablation. At one time she was on Tikosyn  which was stopped for QT prolongation.  She has chronic systolic CHF (EF 59% in 12/2014).  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 Last cath in 2014 with atretic LIMA to LAD but nonobstructive disease in LAD.   Echo in 10/18 showed EF improved to 55-60% with mild to moderate RV dysfunction.   Echo in 7/20 showed EF 50% with basal inferior and inferolateral akinesis, mildly decreased RV systolic function, s/p MV repair with trivial MR, no MS, normal IVC.  Echo in 1/22 showed EF 45-50% with basal inferior and basal inferolateral akinesis, mildly decreased RV systolic function, s/p MV repair with trivial MR and mean gradient 4 mmHg.   Patient developed breast cancer in 2022 and is status post lumpectomy.  Echo 12/22 showed EF 45-50%, diffuse hypokinesis, mildly decreased RV function, repaired mitral valve with trivial MR no MS, normal IVC.   Patient was admitted in 3/24 with metapneumovirus PNA.  HS-troponin was found to be elevated to 3029 and she had some chest pain, suspected NSTEMI.  Coronary CTA was done, showing totally occluded LCx with collaterals, atretic LIMA but patent LAD.  There was a possible small ulceration in the ascending aorta.  Echo in 3/24 showed EF 50-55%, akinesis of the basal -mid inferior and inferolateral walls, moderate RV dysfunction, s/p MV repair with minimal MR.   Echo 3/25 showed EF 60-65%, moderate RV dysfunction, s/p MV repair with mild MR and moderate to severe TR  Today she returns for HF follow up. Weight up about 4  lbs.  No dyspnea walking for exercise or walking in stores.  Lives at [facility].  No chest pain.  Main complaint is low back pain from degenerative disc disease. No orthopnea/PND.  No palpitations or lightheadedness.   Device Interrogation (personally reviewed): 97% BiV pacing, stable thoracic impedance.   ECG (personally reviewed): AF with BiV pacing  Labs (5/24): K 3.8, creatinine 0.61, LDL 63 Labs (7/24): creatinine 0.70 Labs (11/24): K 4.4, creatinine 0.72 Labs (1/25): K 3.9, creatinine 0.68, LDL 53 Labs (5/25): K 4.0, creatinine 0.68 Labs (11/25): K 3.7, creatinine 0.62, LDL 53, hgb 14.3  ROS: All systems reviewed and negative except as per HPI.  PMH: 1. Type II diabetes. 2. COPD 3. Depression 4. H/o colon cancer 5. CAD: LIMA-LAD in 4/01 with MV repair.   - LHC (8/14): LIMA-LAD atretic, 50-60% mid LAD stenosis.  - Coronary CTA (3/24): totally occluded LCx with collaterals, atretic LIMA but patent LAD with 50-69% proximal stenosis.  6. Mitral regurgitation: S/p MV repair at the [facility] in 4/01.  She also had LAA ligation and MAZE. Echo in 7/20 with trivial MR and no MS.  7. 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   - St Jude CRT-P device.  - Echo (4/16): EF 40%, mild LV dilation, normal RV size with mild to moderately decreased systolic function, s/p mitral valve repair with mild MR, no significant stenosis.  - Echo (10/18): EF 55-60%, severe LAE, RV with mild to moderate systolic dysfunction, mild-moderate  TR, PASP 35 mmHg.  - Echo (7/20): EF 50% with basal inferior and inferolateral akinesis, mildly decreased RV systolic function, s/p MV repair with trivial MR, no MS, normal IVC.  - Echo (1/22): EF 45-50% with basal inferior and basal inferolateral akinesis, mildly decreased RV systolic function, s/p MV repair with trivial MR and mean gradient 4 mmHg. - Echo (12/22): EF 45-50%, diffuse hypokinesis, mildly decreased RV function, repaired mitral valve with  trivial MR no MS, normal IVC.  - Echo (3/24): EF 50-55%, akinesis of the basal -mid inferior and inferolateral walls, moderate RV dysfunction, s/p MV repair with minimal MR.  - Echo (3/25): EF 60-65%, moderate RV dysfunction, s/p MV repair with mild MR and moderate to severe TR 8. Permanent atrial fibrillation: S/p MAZE in 4/01.  S/p AV nodal ablation in 9/13.   9. PFTs (1/19): No significant obstruction or restriction in the lungs.  10. Breast cancer: S/p lumpectomy in 2022.   ROS: All systems reviewed and negative except as per HPI.   Current Outpatient Medications  Medication Sig Dispense Refill   amoxicillin  (AMOXIL ) 500 MG capsule Take 1 capsule (500 mg total) by mouth 3 (three) times daily. 90 capsule 1   atorvastatin  (LIPITOR) 40 MG tablet Take 1 tablet (40 mg total) by mouth daily. 90 tablet 3   carvedilol  (COREG ) 12.5 MG tablet Take 1 tablet (12.5 mg total) by mouth 2 (two) times daily with a meal. 180 tablet 3   cetirizine (ZYRTEC) 10 MG tablet Take 10 mg by mouth daily.     cholecalciferol  (VITAMIN D ) 1000 UNITS tablet Take 1,000 Units by mouth daily.      cyanocobalamin  (VITAMIN B12) 500 MCG tablet Take 500 mcg by mouth daily.     fish oil-omega-3 fatty acids 1000 MG capsule Take 1 g by mouth 2 (two) times daily.     JARDIANCE  25 MG TABS tablet TAKE 1 TABLET DAILY 30 tablet 11   Polyethyl Glycol-Propyl Glycol (SYSTANE) 0.4-0.3 % SOLN Place 1 drop into both eyes daily.     RYBELSUS  7 MG TABS TAKE 1 TABLET DAILY 30 tablet 5   sacubitril -valsartan  (ENTRESTO ) 24-26 MG Take 1 tablet by mouth 2 (two) times daily. 60 tablet 11   sodium chloride  (OCEAN) 0.65 % SOLN nasal spray Place 1 spray into both nostrils 2 (two) times daily.     spironolactone  (ALDACTONE ) 25 MG tablet Take 1 tablet (25 mg total) by mouth daily. 90 tablet 3   tamoxifen  (NOLVADEX ) 20 MG tablet TAKE 1 TABLET DAILY 90 tablet 2   venlafaxine  XR (EFFEXOR -XR) 37.5 MG 24 hr capsule TAKE 1 CAPSULE DAILY 90 capsule 2    warfarin (JANTOVEN ) 2.5 MG tablet TAKE 1 TABLET DAILY EXCEPT TAKE 1 AND 1/2 TABLETS ON  FRIDAYS OR AS DIRECTED BY  ANTICOAGULATION CLINIC 105 tablet 3   No current facility-administered medications for this encounter.   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    Employer: Engineer, Maintenance (it): [employer]   Tobacco Use   Smoking status: Never   Smokeless tobacco: Never  Vaping Use   Vaping status: Never Used  Substance and Sexual Activity   Alcohol use: No   Drug use: No   Sexual activity: Not Currently    Birth control/protection: Surgical  Other Topics Concern   Not on file  Social History Narrative   No caffeine   Right handed    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)   Has a nurse, mental health   Social Drivers of Corporate Investment Banker Strain: Low Risk  (06/12/2023)   Overall Financial Resource Strain (CARDIA)    Difficulty of Paying Living Expenses: Not hard at all  Food Insecurity: No Food Insecurity (06/12/2023)   Hunger Vital Sign    Worried About Running Out of Food in the Last Year: Never true    Ran Out of Food in the Last Year: Never true  Transportation Needs: No Transportation Needs (06/12/2023)   PRAPARE - Administrator, Civil Service (Medical): No    Lack of Transportation (Non-Medical): No  Physical Activity: Sufficiently Active (06/12/2023)   Exercise Vital Sign    Days of Exercise per Week: 7 days    Minutes of  Exercise per Session: 30 min  Stress: No Stress Concern Present (06/12/2023)   [employer] of Occupational Health - Occupational Stress Questionnaire    Feeling of Stress : Not at all  Social Connections: Moderately Isolated (06/12/2023)   Social Connection and Isolation Panel    Frequency of Communication with Friends and Family: More than three times a week    Frequency of Social Gatherings with Friends and Family: More than three times a week    Attends Religious Services: More than 4 times per year    Active Member of [employer] or Organizations: No    Attends Banker Meetings: Never    Marital Status: Widowed  Intimate Partner Violence: Patient Unable To Answer (06/12/2023)   Humiliation, Afraid, Rape, and Kick questionnaire    Fear of Current or Ex-Partner: Patient unable to answer    Emotionally Abused: Patient unable to answer    Physically Abused: Patient unable to answer    Sexually Abused: Patient unable to answer   Family History  Problem Relation Age of Onset   Diabetes Mother    Kidney disease Mother    Thyroid  disease Mother    Alzheimer's disease Mother        Symptom onset in late 34s; confirmed via autopsy   Depression Mother    Heart disease Father    Diabetes Father    Heart disease Sister        x 2   Colon cancer Maternal Aunt        dx 17s; mother's paternal half sister   Colon cancer Maternal Aunt        dx late 84s   Heart disease Paternal Uncle        x 6   Cancer Maternal Grandfather        unknown type; dx unknown age   Diabetes Paternal Grandmother    Diabetes Paternal Grandfather    Colon cancer Cousin    Brain cancer Nephew 52   Colon polyps Neg Hx    Adrenal disorder Neg Hx    Esophageal cancer Neg Hx    Rectal cancer Neg Hx  Stomach cancer Neg Hx    BP 118/85   Pulse 70   Wt 66.7 kg (147 lb)   SpO2 97%   BMI 27.78 kg/m   Wt Readings from Last 3 Encounters:  08/08/24 66.7 kg (147 lb)  07/16/24 67.7 kg (149 lb 3.2  oz)  04/01/24 66.5 kg (146 lb 9.6 oz)   PHYSICAL EXAM: General: NAD Neck: No JVD, no thyromegaly or thyroid  nodule.  Lungs: Clear to auscultation bilaterally with normal respiratory effort. CV: Nondisplaced PMI.  Heart regular S1/S2, no S3/S4, no murmur.  No peripheral edema.  No carotid bruit.  Normal pedal pulses.  Abdomen: Soft, nontender, no hepatosplenomegaly, no distention.  Skin: Intact without lesions or rashes.  Neurologic: Alert and oriented x 3.  Psych: Normal affect. Extremities: No clubbing or cyanosis.  HEENT: Normal.   ASSESSMENT & PLAN: 1. Chronic systolic CHF: EF 59% in 2016. NICM most likely.  She has LIMA-LAD that is atretic, but no flow limiting disease in the LAD.  Cardiomyopathy may be due to RV pacing after AV nodal ablation in 9/13. She now has St Jude CRT-P device and EF was up to 55-60% on 10/18 echo. Echo in 7/20 showed EF about 50%.  Echo in 1/22 and in 12/22 showed stable EF 45-50%.  Echo in 3/24 showed EF 50-55%, akinesis of the basal -mid inferior and inferolateral walls, moderate RV dysfunction, s/p MV repair with minimal MR. Echo 3/25 showed EF 60-65%, moderate RV dysfunction, s/p MV repair with mild MR and moderate to severe TR. NYHA class I-II symptoms, not volume overloaded by exam or Corvue.  - She does not need regular Lasix .  - Continue Coreg  12.5 mg bid.  - Continue Jardiance  25 mg daily. - Continue Entresto  24/26 bid. - Continue spironolactone  25 mg daily.  BMET/BNP today.  - I will arrange for repeat echo, need to follow TR which was moderate to severe on last echo though she does not have a significant murmur.  2. CAD: LIMA-LAD at time of MV surgery in 4/01, but LIMA-LAD atretic on 8/14 cath.  However, at that time her LAD had moderate stenosis that did not appear flow-limiting (50-60%).  Coronary CTA in 3/24 showed totally occluded LCx with collaterals, atretic LIMA but patent LAD with 50-69% proximal stenosis.  NSTEMI in 3/24 but coronary CTA showed  no culprit lesion.  The occluded LCx was old as evidenced by collaterals and also prior echoes have shown LCx territory wall motion abnormalities.  No chest pain.  - Continue atorvastatin  40 mg daily. Check lipids.  - No ASA given use of warfarin 3. Complete heart block: She had an AV nodal ablation.  Now s/p CRT-P.   - Device interrogation as above. 4. Atrial fibrillation: Permanent. Now s/p AV nodal ablation with BiV pacing. - Continue warfarin for anticoagulation. - She could swich anticoagulation to Eliquis, data suggests ok to use DOAC with valve repair, bioprosthetic valve.  However, she has wanted to continue warfarin as she has been on it stably x years.  5. DM: management per PCP. 6. S/p MV repair: Stable mitral valve repair on 3/25 echo.   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  - CTA chest showed aortic abnormality is scarring from prior cardiopulmonary bypass cannulation, no penetrating aortic ulceration. 8. Tricuspid regurgitation: Moderate to severe on 3/25 echo, possibly related to her pacemaker.  She does not have a prominent murmur on exam. ' - Will repeat echo in  early 2026.   Followup 4 months with APP.   I spent 31 minutes reviewing records, interviewing/examining patient, and managing orders.   Anita Shuck, MD 08/09/24

## 2024-08-13 ENCOUNTER — Other Ambulatory Visit: Payer: Self-pay | Admitting: Internal Medicine

## 2024-08-14 ENCOUNTER — Other Ambulatory Visit: Payer: Self-pay | Admitting: Hematology

## 2024-08-14 DIAGNOSIS — C50112 Malignant neoplasm of central portion of left female breast: Secondary | ICD-10-CM

## 2024-08-15 ENCOUNTER — Ambulatory Visit

## 2024-08-15 ENCOUNTER — Ambulatory Visit (INDEPENDENT_AMBULATORY_CARE_PROVIDER_SITE_OTHER)

## 2024-08-15 VITALS — Ht 61.0 in | Wt 147.0 lb

## 2024-08-15 DIAGNOSIS — Z7901 Long term (current) use of anticoagulants: Secondary | ICD-10-CM | POA: Diagnosis not present

## 2024-08-15 DIAGNOSIS — Z0000 Encounter for general adult medical examination without abnormal findings: Secondary | ICD-10-CM | POA: Diagnosis not present

## 2024-08-15 LAB — POCT INR: INR: 2.1 (ref 2.0–3.0)

## 2024-08-15 NOTE — Patient Instructions (Addendum)
 Ms. Gutmann,  Thank you for taking the time for your Medicare Wellness Visit. I appreciate your continued commitment to your health goals. Please review the care plan we discussed, and feel free to reach out if I can assist you further.  Please note that Annual Wellness Visits do not include a physical exam. Some assessments may be limited, especially if the visit was conducted virtually. If needed, we may recommend an in-person follow-up with your provider.  Ongoing Care Seeing your primary care provider every 3 to 6 months helps us  monitor your health and provide consistent, personalized care.   Referrals If a referral was made during today's visit and you haven't received any updates within two weeks, please contact the referred provider directly to check on the status.  Recommended Screenings:  Health Maintenance  Topic Date Due   Yearly kidney health urinalysis for diabetes  08/26/2015   Hemoglobin A1C  04/01/2024   COVID-19 Vaccine (5 - 2025-26 season) 05/05/2024   DTaP/Tdap/Td vaccine (1 - Tdap) 10/02/2024*   Breast Cancer Screening  12/13/2024   Complete foot exam   04/01/2025   Osteoporosis screening with Bone Density Scan  04/08/2025   Eye exam for diabetics  08/06/2025   Yearly kidney function blood test for diabetes  08/08/2025   Medicare Annual Wellness Visit  08/15/2025   Pneumococcal Vaccine for age over 78  Completed   Flu Shot  Completed   Zoster (Shingles) Vaccine  Completed   Meningitis B Vaccine  Aged Out   Colon Cancer Screening  Discontinued  *Topic was postponed. The date shown is not the original due date.       08/15/2024    8:54 AM  Advanced Directives  Does Patient Have a Medical Advance Directive? Yes  Type of Estate Agent of [place];Living will  Does patient want to make changes to medical advance directive? Yes (Inpatient - patient requests chaplain consult to change a medical advance directive)  Copy of Healthcare Power of  Attorney in Chart? No - copy requested    Vision: Annual vision screenings are recommended for early detection of glaucoma, cataracts, and diabetic retinopathy. These exams can also reveal signs of chronic conditions such as diabetes and high blood pressure.  Dental: Annual dental screenings help detect early signs of oral cancer, gum disease, and other conditions linked to overall health, including heart disease and diabetes.

## 2024-08-15 NOTE — Progress Notes (Signed)
 Continue 1 tablet daily except take 1 1/2 tablets on Fridays. Recheck in 6 weeks.

## 2024-08-15 NOTE — Patient Instructions (Addendum)
 Pre visit review using our clinic review tool, if applicable. No additional management support is needed unless otherwise documented below in the visit note.  Continue 1 tablet daily except take 1 1/2 tablets on Fridays. Recheck in 6 weeks.

## 2024-08-15 NOTE — Progress Notes (Signed)
 Chief Complaint  Patient presents with   Medicare Wellness     Subjective:   Anita Duke is a 83 y.o. female who presents for a Medicare Annual Wellness Visit.  Visit info / Clinical Intake: Medicare Wellness Visit Type:: Subsequent Annual Wellness Visit Persons participating in visit and providing information:: patient Medicare Wellness Visit Mode:: Telephone If telephone:: video declined Since this visit was completed virtually, some vitals may be partially provided or unavailable. Missing vitals are due to the limitations of the virtual format.: Documented vitals are patient reported If Telephone or Video please confirm:: I connected with patient using audio/video enable telemedicine. I verified patient identity with two identifiers, discussed telehealth limitations, and patient agreed to proceed. 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 : no  Functional Status Activities of Daily Living (to include ambulation/medication): Independent Ambulation: Independent with device- listed below Home Assistive Devices/Equipment: Eyeglasses Medication Administration: Independent Home Management (perform basic housework or laundry):  Independent Manage your own finances?: yes Primary transportation is: driving Concerns about vision?: no *vision screening is required for WTM* Concerns about hearing?: no  Fall Screening Falls in the past year?: 0 Number of falls in past year: 0 Was there an injury with Fall?: 0 Fall Risk Category Calculator: 0 Patient Fall Risk Level: Low Fall Risk  Fall Risk Patient at Risk for Falls Due to: No Fall Risks Fall risk Follow up: Falls evaluation completed; Falls prevention discussed  Home and Transportation Safety: All rugs have non-skid backing?: N/A, no rugs All stairs or steps have railings?: N/A, no stairs Grab bars in the bathtub or shower?: yes Have non-skid surface in bathtub or shower?: yes Good home lighting?: yes Regular seat belt use?: yes Hospital stays in the last year:: no  Cognitive Assessment Difficulty concentrating, remembering, or making decisions? : no Will 6CIT or Mini Cog be Completed: yes What year is it?: 0 points What month is it?: 0 points Give patient an address phrase to remember (5 components): [address] [place], [employer] (For Healthcare) Does Patient Have a Medical Advance Directive?: Yes Does patient want to make changes to medical advance directive?: Yes (Inpatient - patient requests chaplain consult to change a medical advance directive) Type of Advance Directive: Healthcare Power of [place]; Living will Copy of Healthcare Power of Attorney in Chart?: No - copy requested Copy of Living Will in Chart?: No - copy requested  Reviewed/Updated  Reviewed/Updated: Reviewed All (Medical, Surgical, Family, Medications, Allergies, Care Teams, Patient Goals)    Allergies (verified) Demerol [meperidine], Morphine , Januvia  [sitagliptin ], Metformin  and related, and Tetanus toxoid   Current Medications (verified) Outpatient Encounter Medications as of 08/15/2024  Medication Sig   amoxicillin  (AMOXIL ) 500 MG capsule Take 1  capsule (500 mg total) by mouth 3 (three) times daily.  atorvastatin  (LIPITOR) 40 MG tablet Take 1 tablet (40 mg total) by mouth daily.   carvedilol  (COREG ) 12.5 MG tablet Take 1 tablet (12.5 mg total) by mouth 2 (two) times daily with a meal.   cetirizine (ZYRTEC) 10 MG tablet Take 10 mg by mouth daily.   cholecalciferol  (VITAMIN D ) 1000 UNITS tablet Take 1,000 Units by mouth daily.    cyanocobalamin  (VITAMIN B12) 500 MCG tablet Take 500 mcg by mouth daily.   fish oil-omega-3 fatty acids 1000 MG capsule Take 1 g by mouth 2 (two) times daily.   JARDIANCE  25 MG TABS tablet TAKE 1 TABLET DAILY   Polyethyl Glycol-Propyl Glycol (SYSTANE) 0.4-0.3 % SOLN Place 1 drop into both eyes daily.   RYBELSUS  7 MG TABS TAKE 1 TABLET DAILY   sacubitril -valsartan  (ENTRESTO ) 24-26 MG Take 1 tablet by mouth 2 (two) times daily.   sodium chloride  (OCEAN) 0.65 % SOLN nasal spray Place 1 spray into both nostrils 2 (two) times daily.   spironolactone  (ALDACTONE ) 25 MG tablet Take 1 tablet (25 mg total) by mouth daily.   tamoxifen  (NOLVADEX ) 20 MG tablet TAKE 1 TABLET DAILY   venlafaxine  XR (EFFEXOR -XR) 37.5 MG 24 hr capsule TAKE 1 CAPSULE DAILY   warfarin (JANTOVEN ) 2.5 MG tablet TAKE 1 TABLET DAILY EXCEPT TAKE 1 AND 1/2 TABLETS ON  FRIDAYS OR AS DIRECTED BY  ANTICOAGULATION CLINIC   [DISCONTINUED] JARDIANCE  25 MG TABS tablet TAKE 1 TABLET DAILY   [DISCONTINUED] Semaglutide  (RYBELSUS ) 7 MG TABS Take one tablet by mouth daily   No facility-administered encounter medications on file as of 08/15/2024.    History: Past Medical History:  Diagnosis Date   Acquired complete AV block    AV node ablation May 28, 2012    Allergy    mild- uses claritin     Anemia    Hemoglobin 10.4, December, 2013   Arthritis    not bad; little in my hands; some in my knees (11/04/2013)   B12 deficiency 03/16/2020   Bilateral sensorineural hearing loss 03/29/2017   Breast cancer (HCC)    CAD (coronary artery disease)    LIMA  to the LAD at time of mitral valve repair / LIMA atretic,, February, 2011   Cataracts, bilateral    removed bilat    Cecal cancer 2014   colon   CHF (congestive heart failure)    Chronic systolic dysfunction of left ventricle 05/21/2013   Clotting disorder    21 yrs ago blood clot in heart    Colon cancer (HCC)    Colon polyp, hyperplastic    Diabetes mellitus type II, controlled 03/02/2009   Diabetic neuropathy 02/21/2018   [facility] - [facility]   Diverticulosis of colon    Diverticulosis of large intestine 08/04/2002   Ejection fraction < 50%    EF 30%, New diagnosis   february, 2011, etiology not clear, consider rate related tachycardia, and not use carvedilol , catheterization no constriction, /    EF 55-60% echo, June, 2011 /     Beta blocker stopped May, 2012 with reactive airway disease.  Patient does not tolerate metoprolol .  Carvedilol  stopped  /   EF 55%, echo, February, 2012  //   EF 40-45%, septal dyssynergy, hypokinesis of the a   Essential hypertension 07/07/2008   Family history of adverse reaction to anesthesia    sister had a hard time waking up after anesthesia   Family history of colon cancer 04/29/2021   GERD (gastroesophageal reflux disease)  Heart murmur    Hemorrhoids, internal    High cholesterol    History of CABG    Hyperlipidemia 07/07/2008   Lumbar radiculopathy, acute 06/17/2018   Major depressive disorder 07/19/2018   Mitral regurgitation    a. s/p repair with LAA ligation and MAZE at time of surgery   Mitral stenosis    Mild, February, 2011, post mitral valve repair / mild, echo, June, 2011  //  Mild functional mitral stenosis, echo, February, 2012  //   mild stenosis, echo, December, 2013  //  mild, echo, August, 2014   //   Mild, echo, April, 2016    Osteopenia    left hip    Pacemaker-Medtronic 05/20/2012   Pacemaker placed September, 2013   //   upgraded to biventricular CRT pacemaker November 04, 2013    Permanent atrial fibrillation     a. s/p MAZE b. s/p AVN ablation   Personal history of colonic polyps 04/29/2021   PONV (postoperative nausea and vomiting)    Pulmonary HTN    QT prolongation    Tikosyn  and Effexor . QT prolonged October 13, 2011, peak is in dose reduced from 500  to -250 twice a day   Right ventricular dysfunction    Mild to moderate, echo, February, 2011 / normalized echo, June, 2011 /  RV normal, echo, February, 2012  //   right ventricle reported as good echo, December, 2013    S/P mitral valve repair    [facility] / Maze procedure/ atrial appendage removed were tied off    Sick sinus syndrome    s/p Medtronic dual chamber PPM 05/17/12    Sleep difficulties 09/13/2019   Vitamin D  deficiency 02/20/2014   Past Surgical History:  Procedure Laterality Date   ABDOMINAL HYSTERECTOMY  ?2002   AV NODE ABLATION N/A 05/28/2012   Procedure: AV NODE ABLATION;  Surgeon: Lynwood Rakers, MD;  Location: [facility] CATH LAB;  Service: Cardiovascular;  Laterality: N/A;   BI-VENTRICULAR PACEMAKER UPGRADE N/A 11/04/2013   upgrade of previously implanted dual chamber pacemaker to STJ Quadra Allure CRTP by Dr Rakers   BIV PACEMAKER GENERATOR CHANGEOUT N/A 06/22/2022   Procedure: BIV PACEMAKER GENERATOR CHANGEOUT;  Surgeon: Inocencio Soyla Lunger, MD;  Location: [facility] INVASIVE CV LAB;  Service: Cardiovascular;  Laterality: N/A;   BREAST EXCISIONAL BIOPSY Left over 10 years ago   benign   BREAST LUMPECTOMY Left 05/26/2021   Procedure: LEFT BREAST LUMPECTOMY;  Surgeon: Aron Shoulders, MD;  Location: [facility] OR;  Service: General;  Laterality: Left;   BREAST SURGERY     CARDIOVERSION  02/08/2012   Procedure: CARDIOVERSION;  Surgeon: Reyes JONETTA Forget, MD;  Location: [facility] OR;  Service: Cardiovascular;  Laterality: N/A;   CARDIOVERSION  02/29/2012   Procedure: CARDIOVERSION;  Surgeon: Reyes JONETTA Forget, MD;  Location: [facility] OR;  Service: Cardiovascular;  Laterality: N/A;   CARDIOVERSION  05/15/2012   Procedure: CARDIOVERSION;  Surgeon: Reyes JONETTA Forget,  MD;  Location[facility] ENDOSCOPY;  Service: Cardiovascular;  Laterality: N/A;   CATARACT EXTRACTION, BILATERAL  2017   CHOLECYSTECTOMY  1995   COLON SURGERY  2014   right hemicolectomy    COLONOSCOPY     CORONARY ARTERY BYPASS GRAFT  2001   CABG X1 at time of mitral valve repair (12/04/2012   DIAGNOSTIC LAPAROSCOPIC LIVER BIOPSY Left 12/03/2012   Procedure: DIAGNOSTIC LAPAROSCOPIC LIVER BIOPSY;  Surgeon: Shoulders Aron, MD;  Location: [facility] OR;  Service: General;  Laterality: Left;   EYE SURGERY  bilateral cataract surgery   LAPAROSCOPIC RIGHT HEMI COLECTOMY  12/03/2012   Procedure: LAPAROSCOPIC RIGHT HEMI COLECTOMY;  Surgeon: Jina Nephew, MD;  Location: [facility] OR;  Service: General;;   LUMBAR LAMINECTOMY/ DECOMPRESSION WITH MET-RX Left 06/17/2018   Procedure: left Lumbar three-fourextraforaminal Microdiscectomy with Met-Rx;  Surgeon: Colon Shove, MD;  Location: [facility] OR;  Service: Neurosurgery;  Laterality: Left;   MAZE  2001   w/ MVR & CABG   MITRAL VALVE REPAIR  2001   anterior and posterior leaflets (12/04/2012)   PERMANENT PACEMAKER INSERTION N/A 05/17/2012   MDT Adapta L implanted by Dr Kelsie for tachy/brady syndrome   POLYPECTOMY     RADIOACTIVE SEED GUIDED EXCISIONAL BREAST BIOPSY Right 07/04/2022   Procedure: RADIOACTIVE SEED GUIDED EXCISIONAL RIGHT BREAST BIOPSY;  Surgeon: Nephew Jina, MD;  Location: [facility] OR;  Service: General;  Laterality: Right;   TONSILLECTOMY AND ADENOIDECTOMY  ~ 1950   UMBILICAL HERNIA REPAIR N/A 12/03/2012   Procedure: HERNIA REPAIR UMBILICAL ADULT;  Surgeon: Jina Nephew, MD;  Location: [facility] OR;  Service: General;  Laterality: N/A;   Family History  Problem Relation Age of Onset   Diabetes Mother    Kidney disease Mother    Thyroid  disease Mother    Alzheimer's disease Mother        Symptom onset in late 11s; confirmed via autopsy   Depression Mother    Heart disease Father    Diabetes Father    Heart disease Sister        x 2   Colon cancer Maternal  Aunt        dx 42s; mother's paternal half sister   Colon cancer Maternal Aunt        dx late 63s   Heart disease Paternal Uncle        x 6   Cancer Maternal Grandfather        unknown type; dx unknown age   Diabetes Paternal Grandmother    Diabetes Paternal Grandfather    Colon cancer Cousin    Brain cancer Nephew 52   Colon polyps Neg Hx    Adrenal disorder Neg Hx    Esophageal cancer Neg Hx    Rectal cancer Neg Hx    Stomach cancer Neg Hx    Social History   Occupational History   Occupation: Retired    Comment: comptroller - [employer]    Employer: Engineer, Maintenance (it): [employer]   Tobacco Use   Smoking status: Never   Smokeless tobacco: Never  Vaping Use   Vaping status: Never Used  Substance and Sexual Activity   Alcohol use: No   Drug use: No   Sexual activity: Not Currently    Birth control/protection: Surgical   Tobacco Counseling Counseling given: Not Answered  SDOH Screenings   Food Insecurity: No Food Insecurity (08/15/2024)  Housing: Unknown (08/15/2024)  Transportation Needs: No Transportation Needs (08/15/2024)  Utilities: Not At Risk (08/15/2024)  Alcohol Screen: Low Risk (06/12/2023)  Depression (PHQ2-9): Low Risk (08/15/2024)  Financial Resource Strain: Low Risk (06/12/2023)  Physical Activity: Sufficiently Active (08/15/2024)  Social Connections: Moderately Integrated (08/15/2024)  Stress: No Stress Concern Present (08/15/2024)  Tobacco Use: Low Risk (08/15/2024)  Health Literacy: Adequate Health Literacy (08/15/2024)   See flowsheets for full screening details  Depression Screen PHQ 2 & 9 Depression Scale- Over the past 2 weeks, how often have you been bothered by any of the following problems? Little interest or pleasure in doing things: 0  Feeling down, depressed, or hopeless (PHQ Adolescent also includes...irritable): 0 PHQ-2 Total Score: 0 Trouble falling or staying asleep, or sleeping too much: 0 Feeling tired or  having little energy: 0 Poor appetite or overeating (PHQ Adolescent also includes...weight loss): 0 Feeling bad about yourself - or that you are a failure or have let yourself or your family down: 0 Trouble concentrating on things, such as reading the newspaper or watching television (PHQ Adolescent also includes...like school work): 0 Moving or speaking so slowly that other people could have noticed. Or the opposite - being so fidgety or restless that you have been moving around a lot more than usual: 0 Thoughts that you would be better off dead, or of hurting yourself in some way: 0 PHQ-9 Total Score: 0 If you checked off any problems, how difficult have these problems made it for you to do your work, take care of things at home, or get along with other people?: Not difficult at all  Depression Treatment Depression Interventions/Treatment : EYV7-0 Score <4 Follow-up Not Indicated     Goals Addressed               This Visit's Progress     Patient Stated (pt-stated)        Patient stated she plans to continue walking (dog) and staying active             Objective:    Today's Vitals   08/15/24 0853  Weight: 147 lb (66.7 kg)  Height: 5' 1 (1.549 m)   Body mass index is 27.78 kg/m.  Hearing/Vision screen Hearing Screening - Comments:: Denies hearing difficulties   Vision Screening - Comments:: Wears rx glasses - up to date with routine eye exams with [facility] Immunizations and Health Maintenance Health Maintenance  Topic Date Due   Diabetic kidney evaluation - Urine ACR  08/26/2015   HEMOGLOBIN A1C  04/01/2024   COVID-19 Vaccine (5 - 2025-26 season) 05/05/2024   DTaP/Tdap/Td (1 - Tdap) 10/02/2024 (Originally 01/11/1960)   Mammogram  12/13/2024   FOOT EXAM  04/01/2025   Bone Density Scan  04/08/2025   OPHTHALMOLOGY EXAM  08/06/2025   Diabetic kidney evaluation - eGFR measurement  08/08/2025   Medicare Annual Wellness (AWV)  08/15/2025   Pneumococcal  Vaccine: 50+ Years  Completed   Influenza Vaccine  Completed   Zoster Vaccines- Shingrix  Completed   Meningococcal B Vaccine  Aged Out   Colonoscopy  Discontinued        Assessment/Plan:  This is a routine wellness examination for [place].  Patient Care Team: Geofm Glade PARAS, MD as PCP - General (Internal Medicine) Rolan Ezra RAMAN, MD as PCP - Cardiology (Cardiology) Inocencio Soyla Lunger, MD as PCP - Electrophysiology (Cardiology) Micky Reyes BIRCH, MD as Consulting Physician (Cardiology) Debrah Lamar BIRCH, MD (Inactive) as Consulting Physician (Gastroenterology) Georjean Darice HERO, MD as Consulting Physician (Neurology) Lanny Callander, MD as Consulting Physician (Hematology) Aron Shoulders, MD as Consulting Physician (General Surgery) Tyree Nanetta SAILOR, RN as Oncology Nurse Navigator Burton, Lacie K, NP as Nurse Practitioner (Nurse Practitioner) Mae Wanda, MD as Consulting Physician (Ophthalmology)  I have personally reviewed and noted the following in the patients chart:   Medical and social history Use of alcohol, tobacco or illicit drugs  Current medications and supplements including opioid prescriptions. Functional ability and status Nutritional status Physical activity Advanced directives List of other physicians Hospitalizations, surgeries, and ER visits in previous 12 months Vitals Screenings to include cognitive, depression, and falls Referrals  and appointments  No orders of the defined types were placed in this encounter.  In addition, I have reviewed and discussed with patient certain preventive protocols, quality metrics, and best practice recommendations. A written personalized care plan for preventive services as well as general preventive health recommendations were provided to patient.   Verdie CHRISTELLA Saba, CMA   08/15/2024   Return in 1 year (on 08/15/2025).  After Visit Summary: (MyChart) Due to this being a telephonic visit, the after visit summary with  patients personalized plan was offered to patient via MyChart   Nurse Notes: Appointment(s) made: (scheduled 2026 AWV appt)

## 2024-08-18 ENCOUNTER — Ambulatory Visit (HOSPITAL_COMMUNITY): Admission: RE | Admit: 2024-08-18 | Discharge: 2024-08-18 | Attending: Cardiology | Admitting: Cardiology

## 2024-08-18 DIAGNOSIS — Z950 Presence of cardiac pacemaker: Secondary | ICD-10-CM | POA: Diagnosis not present

## 2024-08-18 DIAGNOSIS — E119 Type 2 diabetes mellitus without complications: Secondary | ICD-10-CM | POA: Diagnosis not present

## 2024-08-18 DIAGNOSIS — Z952 Presence of prosthetic heart valve: Secondary | ICD-10-CM | POA: Diagnosis not present

## 2024-08-18 DIAGNOSIS — I2510 Atherosclerotic heart disease of native coronary artery without angina pectoris: Secondary | ICD-10-CM | POA: Diagnosis not present

## 2024-08-18 DIAGNOSIS — I499 Cardiac arrhythmia, unspecified: Secondary | ICD-10-CM | POA: Diagnosis not present

## 2024-08-18 DIAGNOSIS — I4891 Unspecified atrial fibrillation: Secondary | ICD-10-CM | POA: Diagnosis not present

## 2024-08-18 DIAGNOSIS — Z951 Presence of aortocoronary bypass graft: Secondary | ICD-10-CM | POA: Diagnosis not present

## 2024-08-18 DIAGNOSIS — I509 Heart failure, unspecified: Secondary | ICD-10-CM | POA: Diagnosis not present

## 2024-08-18 DIAGNOSIS — I5022 Chronic systolic (congestive) heart failure: Secondary | ICD-10-CM | POA: Diagnosis not present

## 2024-08-18 DIAGNOSIS — I519 Heart disease, unspecified: Secondary | ICD-10-CM

## 2024-08-18 DIAGNOSIS — I083 Combined rheumatic disorders of mitral, aortic and tricuspid valves: Secondary | ICD-10-CM | POA: Diagnosis not present

## 2024-08-18 DIAGNOSIS — I517 Cardiomegaly: Secondary | ICD-10-CM | POA: Diagnosis not present

## 2024-08-18 LAB — ECHOCARDIOGRAM COMPLETE
Area-P 1/2: 2.08 cm2
MV VTI: 1.48 cm2
S' Lateral: 3.6 cm

## 2024-08-19 NOTE — Telephone Encounter (Addendum)
 Pt aware, agreeable, and verbalized understanding   ----- Message from Ezra Shuck, MD sent at 08/19/2024  1:18 PM EST ----- EF normal, moderate RV dysfunction, stable mitral valve repair. Only mild TR.

## 2024-08-26 ENCOUNTER — Encounter: Payer: Self-pay | Admitting: Cardiology

## 2024-09-07 ENCOUNTER — Other Ambulatory Visit: Payer: Self-pay | Admitting: Hematology

## 2024-09-07 DIAGNOSIS — C50112 Malignant neoplasm of central portion of left female breast: Secondary | ICD-10-CM

## 2024-09-10 ENCOUNTER — Telehealth: Payer: Self-pay

## 2024-09-10 NOTE — Telephone Encounter (Signed)
 Copied from CRM [id number]. Topic: General - Other >> Sep 10, 2024  2:43 PM Suzen RAMAN wrote: Reason for CRM: Patient would like to inform provider has a funny feeling in her head on Monday(09/09/23) and fell and hit her head. She  was assessed by the facility nurse and just wanted to make her provider aware.

## 2024-09-12 NOTE — Telephone Encounter (Signed)
 Make sure she is monitoring her blood pressure and heart rate regularly and keeping track of it.  Low blood pressure is one of the things that can cause this.  If she continues to have symptoms similar she needs to come in.  Please ask her how [place] is

## 2024-09-12 NOTE — Telephone Encounter (Signed)
 Spoke with patient today and info given.

## 2024-09-26 ENCOUNTER — Ambulatory Visit

## 2024-09-26 DIAGNOSIS — Z7901 Long term (current) use of anticoagulants: Secondary | ICD-10-CM | POA: Diagnosis not present

## 2024-09-26 LAB — POCT INR: INR: 1.9 — AB (ref 2.0–3.0)

## 2024-09-26 NOTE — Patient Instructions (Addendum)
 Pre visit review using our clinic review tool, if applicable. No additional management support is needed unless otherwise documented below in the visit note.  Increase dose today to take 1 tablet and then continue 1 tablet daily except take 1 1/2 tablets on Fridays. Recheck in 2 weeks at appointment with Dr. Geofm at 2/5. I will call you with dosing instructions that day after I receive the result.

## 2024-09-26 NOTE — Progress Notes (Signed)
 Indication: Afib Pt had fall on 1/5 and did hit her head. She believes the fall was from low blood sugar. She reports today she did have a lot of bleeding from the back of her head and was knocked unconscious but was assessed by the RN at her facility and the nurse thought she was ok and did not need to go to the ER. Pt reports BP at that time was 187/???. Pt did send a msg to PCP at the time but did not include that she had bleeding or elevated BP. Pt reports she has been monitoring her blood pressure and it is back in her normal range. Pt denies any current symptoms from the fall.  Increase dose today to take 1 tablet and then continue 1 tablet daily except take 1 1/2 tablets on Fridays. Recheck in 2 weeks at appointment with Dr. Geofm at 2/5. I will call you with dosing instructions that day after I receive the result.  Made note on PCP schedule requesting a lab INR draw at pt's apt with her on 2/5. Also sent a msg.

## 2024-10-08 ENCOUNTER — Encounter: Payer: Self-pay | Admitting: Internal Medicine

## 2024-10-08 NOTE — Progress Notes (Unsigned)
 "   Subjective:    Patient ID: Anita Duke, female    DOB: 09-28-1940, 84 y.o.   MRN: [id number]      HPI Anita Duke is here for a Physical exam and her chronic medical problems.   [place]lemens since she was here last.  She had this feeling in her head that she has had in the past, but typically has it when she is sitting.  After that came she found herself on the floor.  She does not recall falling.  She did hit her head.  She was evaluated by a nurse where she lives who did not feel like she needed to go to the hospital.  She still has a scab where she hit.  She she denies any headaches.   Medications and allergies reviewed with patient and updated if appropriate.  Medications Ordered Prior to Encounter[1]  Review of Systems  Constitutional:  Negative for fever.  Eyes:  Negative for visual disturbance.  Respiratory:  Negative for cough, shortness of breath and wheezing.   Cardiovascular:  Negative for chest pain, palpitations and leg swelling.  Gastrointestinal:  Negative for abdominal pain, blood in stool, constipation and diarrhea.       No gerd  Genitourinary:  Negative for dysuria.  Musculoskeletal:  Positive for back pain (chronic). Negative for arthralgias.  Skin:  Negative for rash.  Neurological:  Negative for light-headedness and headaches.  Psychiatric/Behavioral:  Negative for dysphoric mood. The patient is not nervous/anxious.        Objective:   Vitals:   10/09/24 0841  BP: (!) 144/68  Pulse: 74  Temp: 98.3 F (36.8 C)  SpO2: 95%   Filed Weights   10/09/24 0841  Weight: 150 lb (68 kg)   Body mass index is 28.34 kg/m.  BP Readings from Last 3 Encounters:  10/09/24 (!) 144/68  08/08/24 118/85  07/16/24 (!) 152/67    Wt Readings from Last 3 Encounters:  10/09/24 150 lb (68 kg)  08/15/24 147 lb (66.7 kg)  08/08/24 147 lb (66.7 kg)       Physical Exam Constitutional: She appears well-developed and well-nourished. No distress.  HENT:  Head:  Normocephalic and atraumatic.  Right Ear: External ear normal. Normal ear canal and TM Left Ear: External ear normal.  Normal ear canal and TM Mouth/Throat: Oropharynx is clear and moist.  Eyes: Conjunctivae normal.  Neck: Neck supple. No tracheal deviation present. No thyromegaly present.  No carotid bruit  Cardiovascular: Normal rate, regular rhythm and normal heart sounds.   No murmur heard.  No edema. Pulmonary/Chest: Effort normal and breath sounds normal. No respiratory distress. She has no wheezes. She has no rales.  Breast: deferred   Abdominal: Soft. She exhibits no distension. There is no tenderness.  Lymphadenopathy: She has no cervical adenopathy.  Skin: Skin is warm and dry. She is not diaphoretic.  Scab posterior head without surrounding erythema or swelling Psychiatric: She has a normal mood and affect. Her behavior is normal.     Lab Results  Component Value Date   WBC 4.5 07/16/2024   HGB 14.3 07/16/2024   HCT 43.8 07/16/2024   PLT 183 07/16/2024   GLUCOSE 139 (H) 08/08/2024   CHOL 118 08/08/2024   TRIG 77 08/08/2024   HDL 55 08/08/2024   LDLDIRECT 65.0 01/05/2016   LDLCALC 48 08/08/2024   ALT 10 07/16/2024   AST 16 07/16/2024   NA 140 08/08/2024   K 3.7 08/08/2024   CL 107 08/08/2024  CREATININE 0.60 08/08/2024   BUN 13 08/08/2024   CO2 27 08/08/2024   TSH 0.99 10/02/2022   INR 1.9 (A) 09/26/2024   HGBA1C 7.9 (H) 10/03/2023   MICROALBUR 0.6 08/25/2014         Assessment & Plan:   Physical exam: Screening blood work  ordered Exercise  walking dog Weight  is good Substance abuse  none   Reviewed recommended immunizations.   Health Maintenance  Topic Date Due   Diabetic kidney evaluation - Urine ACR  08/26/2015   HEMOGLOBIN A1C  04/01/2024   COVID-19 Vaccine (5 - 2025-26 season) 10/25/2024 (Originally 05/05/2024)   DTaP/Tdap/Td (1 - Tdap) 10/09/2025 (Originally 01/11/1960)   Mammogram  12/13/2024   FOOT EXAM  04/01/2025   Bone Density  Scan  04/08/2025   OPHTHALMOLOGY EXAM  08/06/2025   Diabetic kidney evaluation - eGFR measurement  08/08/2025   Medicare Annual Wellness (AWV)  08/15/2025   Pneumococcal Vaccine: 50+ Years  Completed   Influenza Vaccine  Completed   Zoster Vaccines- Shingrix  Completed   Meningococcal B Vaccine  Aged Out   Colonoscopy  Discontinued          See Problem List for Assessment and Plan of chronic medical problems.        [1]  Current Outpatient Medications on File Prior to Visit  Medication Sig Dispense Refill   amoxicillin  (AMOXIL ) 500 MG capsule Take 1 capsule (500 mg total) by mouth 3 (three) times daily. 90 capsule 1   atorvastatin  (LIPITOR) 40 MG tablet Take 1 tablet (40 mg total) by mouth daily. 90 tablet 3   carvedilol  (COREG ) 12.5 MG tablet Take 1 tablet (12.5 mg total) by mouth 2 (two) times daily with a meal. 180 tablet 3   cetirizine (ZYRTEC) 10 MG tablet Take 10 mg by mouth daily.     cholecalciferol  (VITAMIN D ) 1000 UNITS tablet Take 1,000 Units by mouth daily.      cyanocobalamin  (VITAMIN B12) 500 MCG tablet Take 500 mcg by mouth daily.     fish oil-omega-3 fatty acids 1000 MG capsule Take 1 g by mouth 2 (two) times daily.     JARDIANCE  25 MG TABS tablet TAKE 1 TABLET DAILY 30 tablet 11   Polyethyl Glycol-Propyl Glycol (SYSTANE) 0.4-0.3 % SOLN Place 1 drop into both eyes daily.     RYBELSUS  7 MG TABS TAKE 1 TABLET DAILY 30 tablet 5   sacubitril -valsartan  (ENTRESTO ) 24-26 MG Take 1 tablet by mouth 2 (two) times daily. 60 tablet 11   sodium chloride  (OCEAN) 0.65 % SOLN nasal spray Place 1 spray into both nostrils 2 (two) times daily.     spironolactone  (ALDACTONE ) 25 MG tablet Take 1 tablet (25 mg total) by mouth daily. 90 tablet 3   tamoxifen  (NOLVADEX ) 20 MG tablet TAKE 1 TABLET DAILY 90 tablet 2   venlafaxine  XR (EFFEXOR -XR) 37.5 MG 24 hr capsule TAKE 1 CAPSULE DAILY 90 capsule 2   warfarin (JANTOVEN ) 2.5 MG tablet TAKE 1 TABLET DAILY EXCEPT TAKE 1 AND 1/2 TABLETS  ON  FRIDAYS OR AS DIRECTED BY  ANTICOAGULATION CLINIC 105 tablet 3   No current facility-administered medications on file prior to visit.   "

## 2024-10-08 NOTE — Assessment & Plan Note (Signed)
 Chronic On warfarin-managed by our Coumadin  clinic PT/INR today

## 2024-10-08 NOTE — Assessment & Plan Note (Signed)
 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

## 2024-10-08 NOTE — Patient Instructions (Addendum)
 "     Blood work was ordered.       Medications changes include :   None    A referral was ordered and someone will call you to schedule an appointment.     Return in about 6 months (around 04/08/2025) for follow up.     Health Maintenance, Female Adopting a healthy lifestyle and getting preventive care are important in promoting health and wellness. Ask your health care provider about: The right schedule for you to have regular tests and exams. Things you can do on your own to prevent diseases and keep yourself healthy. What should I know about diet, weight, and exercise? Eat a healthy diet  Eat a diet that includes plenty of vegetables, fruits, low-fat dairy products, and lean protein. Do not eat a lot of foods that are high in solid fats, added sugars, or sodium. Maintain a healthy weight Body mass index (BMI) is used to identify weight problems. It estimates body fat based on height and weight. Your health care provider can help determine your BMI and help you achieve or maintain a healthy weight. Get regular exercise Get regular exercise. This is one of the most important things you can do for your health. Most adults should: Exercise for at least 150 minutes each week. The exercise should increase your heart rate and make you sweat (moderate-intensity exercise). Do strengthening exercises at least twice a week. This is in addition to the moderate-intensity exercise. Spend less time sitting. Even light physical activity can be beneficial. Watch cholesterol and blood lipids Have your blood tested for lipids and cholesterol at 85 years of age, then have this test every 5 years. Have your cholesterol levels checked more often if: Your lipid or cholesterol levels are high. You are older than 85 years of age. You are at high risk for heart disease. What should I know about cancer screening? Depending on your health history and family history, you may need to have cancer  screening at various ages. This may include screening for: Breast cancer. Cervical cancer. Colorectal cancer. Skin cancer. Lung cancer. What should I know about heart disease, diabetes, and high blood pressure? Blood pressure and heart disease High blood pressure causes heart disease and increases the risk of stroke. This is more likely to develop in people who have high blood pressure readings or are overweight. Have your blood pressure checked: Every 3-5 years if you are 22-23 years of age. Every year if you are 12 years old or older. Diabetes Have regular diabetes screenings. This checks your fasting blood sugar level. Have the screening done: Once every three years after age 78 if you are at a normal weight and have a low risk for diabetes. More often and at a younger age if you are overweight or have a high risk for diabetes. What should I know about preventing infection? Hepatitis B If you have a higher risk for hepatitis B, you should be screened for this virus. Talk with your health care provider to find out if you are at risk for hepatitis B infection. Hepatitis C Testing is recommended for: Everyone born from [birth year] through 1966. Anyone with known risk factors for hepatitis C. Sexually transmitted infections (STIs) Get screened for STIs, including gonorrhea and chlamydia, if: You are sexually active and are younger than 85 years of age. You are older than 85 years of age and your health care provider tells you that you are at risk for this type of infection. Your  sexual activity has changed since you were last screened, and you are at increased risk for chlamydia or gonorrhea. Ask your health care provider if you are at risk. Ask your health care provider about whether you are at high risk for HIV. Your health care provider may recommend a prescription medicine to help prevent HIV infection. If you choose to take medicine to prevent HIV, you should first get tested for HIV. You  should then be tested every 3 months for as long as you are taking the medicine. Pregnancy If you are about to stop having your period (premenopausal) and you may become pregnant, seek counseling before you get pregnant. Take 400 to 800 micrograms (mcg) of folic acid every day if you become pregnant. Ask for birth control (contraception) if you want to prevent pregnancy. Osteoporosis and menopause Osteoporosis is a disease in which the bones lose minerals and strength with aging. This can result in bone fractures. If you are 20 years old or older, or if you are at risk for osteoporosis and fractures, ask your health care provider if you should: Be screened for bone loss. Take a calcium  or vitamin D  supplement to lower your risk of fractures. Be given hormone replacement therapy (HRT) to treat symptoms of menopause. Follow these instructions at home: Alcohol use Do not drink alcohol if: Your health care provider tells you not to drink. You are pregnant, may be pregnant, or are planning to become pregnant. If you drink alcohol: Limit how much you have to: 0-1 drink a day. Know how much alcohol is in your drink. In the [place] one drink equals one 12 oz bottle of beer (355 mL), one 5 oz glass of wine (148 mL), or one 1 oz glass of hard liquor (44 mL). Lifestyle Do not use any products that contain nicotine or tobacco. These products include cigarettes, chewing tobacco, and vaping devices, such as e-cigarettes. If you need help quitting, ask your health care provider. Do not use street drugs. Do not share needles. Ask your health care provider for help if you need support or information about quitting drugs. General instructions Schedule regular health, dental, and eye exams. Stay current with your vaccines. Tell your health care provider if: You often feel depressed. You have ever been abused or do not feel safe at home. This information is not intended to replace advice given to you by  your health care provider. Make sure you discuss any questions you have with your health care provider. Document Revised: 02/27/2024 Document Reviewed: 01/10/2021 Elsevier Patient Education  2025 [employer]. "

## 2024-10-09 ENCOUNTER — Telehealth: Payer: Self-pay

## 2024-10-09 ENCOUNTER — Ambulatory Visit: Admitting: Internal Medicine

## 2024-10-09 VITALS — BP 140/70 | HR 74 | Temp 98.3°F | Ht 61.0 in | Wt 150.0 lb

## 2024-10-09 DIAGNOSIS — E1159 Type 2 diabetes mellitus with other circulatory complications: Secondary | ICD-10-CM

## 2024-10-09 DIAGNOSIS — Z7901 Long term (current) use of anticoagulants: Secondary | ICD-10-CM

## 2024-10-09 DIAGNOSIS — I4891 Unspecified atrial fibrillation: Secondary | ICD-10-CM

## 2024-10-09 DIAGNOSIS — F3289 Other specified depressive episodes: Secondary | ICD-10-CM

## 2024-10-09 DIAGNOSIS — Z0000 Encounter for general adult medical examination without abnormal findings: Secondary | ICD-10-CM

## 2024-10-09 DIAGNOSIS — Z95811 Presence of heart assist device: Secondary | ICD-10-CM

## 2024-10-09 DIAGNOSIS — I519 Heart disease, unspecified: Secondary | ICD-10-CM

## 2024-10-09 DIAGNOSIS — E1169 Type 2 diabetes mellitus with other specified complication: Secondary | ICD-10-CM

## 2024-10-09 DIAGNOSIS — E559 Vitamin D deficiency, unspecified: Secondary | ICD-10-CM

## 2024-10-09 DIAGNOSIS — E1142 Type 2 diabetes mellitus with diabetic polyneuropathy: Secondary | ICD-10-CM

## 2024-10-09 DIAGNOSIS — M85852 Other specified disorders of bone density and structure, left thigh: Secondary | ICD-10-CM

## 2024-10-09 DIAGNOSIS — I2510 Atherosclerotic heart disease of native coronary artery without angina pectoris: Secondary | ICD-10-CM

## 2024-10-09 DIAGNOSIS — I4819 Other persistent atrial fibrillation: Secondary | ICD-10-CM

## 2024-10-09 DIAGNOSIS — E538 Deficiency of other specified B group vitamins: Secondary | ICD-10-CM

## 2024-10-09 LAB — COMPREHENSIVE METABOLIC PANEL WITH GFR
ALT: 10 U/L (ref 3–35)
AST: 17 U/L (ref 5–37)
Albumin: 4.1 g/dL (ref 3.5–5.2)
Alkaline Phosphatase: 54 U/L (ref 39–117)
BUN: 14 mg/dL (ref 6–23)
CO2: 30 meq/L (ref 19–32)
Calcium: 9.2 mg/dL (ref 8.4–10.5)
Chloride: 105 meq/L (ref 96–112)
Creatinine, Ser: 0.64 mg/dL (ref 0.40–1.20)
GFR: 80.97 mL/min
Glucose, Bld: 128 mg/dL — ABNORMAL HIGH (ref 70–99)
Potassium: 3.4 meq/L — ABNORMAL LOW (ref 3.5–5.1)
Sodium: 142 meq/L (ref 135–145)
Total Bilirubin: 0.9 mg/dL (ref 0.2–1.2)
Total Protein: 6.6 g/dL (ref 6.0–8.3)

## 2024-10-09 LAB — MICROALBUMIN / CREATININE URINE RATIO
Creatinine,U: 55.6 mg/dL
Microalb Creat Ratio: UNDETERMINED mg/g (ref 0.0–30.0)
Microalb, Ur: <0.7 mg/dL

## 2024-10-09 LAB — CBC
HCT: 41.4 % (ref 36.0–46.0)
Hemoglobin: 13.7 g/dL (ref 12.0–15.0)
MCHC: 33.2 g/dL (ref 30.0–36.0)
MCV: 91.5 fl (ref 78.0–100.0)
Platelets: 165 10*3/uL (ref 150.0–400.0)
RBC: 4.52 Mil/uL (ref 3.87–5.11)
RDW: 14.2 % (ref 11.5–15.5)
WBC: 4.8 10*3/uL (ref 4.0–10.5)

## 2024-10-09 LAB — LIPID PANEL
Cholesterol: 116 mg/dL (ref 28–200)
HDL: 51.9 mg/dL
LDL Cholesterol: 42 mg/dL (ref 10–99)
NonHDL: 64.13
Total CHOL/HDL Ratio: 2
Triglycerides: 111 mg/dL (ref 10.0–149.0)
VLDL: 22.2 mg/dL (ref 0.0–40.0)

## 2024-10-09 LAB — VITAMIN D 25 HYDROXY (VIT D DEFICIENCY, FRACTURES): VITD: 46.86 ng/mL (ref 30.00–100.00)

## 2024-10-09 LAB — HEMOGLOBIN A1C: Hgb A1c MFr Bld: 6.9 % — ABNORMAL HIGH (ref 4.6–6.5)

## 2024-10-09 LAB — TSH: TSH: 1 u[IU]/mL (ref 0.35–5.50)

## 2024-10-09 LAB — VITAMIN B12: Vitamin B-12: 1020 pg/mL — ABNORMAL HIGH (ref 211–911)

## 2024-10-09 NOTE — Assessment & Plan Note (Signed)
 Chronic Taking vitamin D daily Check vitamin D level

## 2024-10-09 NOTE — Assessment & Plan Note (Signed)
 Chronic Follows with cardiology Appears euvolemic Continue carvedilol , Entresto , Jardiance  and spironolactone 

## 2024-10-09 NOTE — Telephone Encounter (Signed)
 Pt returned call. She requested to make an apt with the coumadin  clinic instead of going back to the lab.  Scheduled pt for tomorrow.

## 2024-10-09 NOTE — Assessment & Plan Note (Signed)
Chronic Taking B12 supplementation Check B12 level

## 2024-10-09 NOTE — Assessment & Plan Note (Signed)
Chronic Not on any medication at this time Follows with podiatry

## 2024-10-09 NOTE — Assessment & Plan Note (Signed)
 Chronic Controlled, Stable Continue venlafaxine 37.5 mg daily

## 2024-10-09 NOTE — Assessment & Plan Note (Signed)
 Chronic Associated with hyperlipidemia, hypertension, CAD Lab Results  Component Value Date   HGBA1C 7.9 (H) 10/03/2023   Sugars not ideally controlled Continue jardiance  25 mg daily, rybelsus  7 mg daily Check A1c today , urine albumin /creatinine ratio Continue regular walking, diabetic diet

## 2024-10-09 NOTE — Assessment & Plan Note (Signed)
 Chronic Follow-up with cardiology No symptoms consistent with angina On warfarin, Coreg, Jardiance, spironolactone, Entresto, atorvastatin

## 2024-10-09 NOTE — Assessment & Plan Note (Signed)
 Chronic Regular exercise and healthy diet encouraged LDL at goal Continue atorvastatin  40 mg daily Check lipid panel, CMP, TSH  Lab Results  Component Value Date   LDLCALC 48 08/08/2024

## 2024-10-09 NOTE — Assessment & Plan Note (Signed)
 Chronic DEXA up to date Continue regular walking Continue vitamin D  supplementation Continue calcium  5-600 mg once daily, eat a high calcium  diet On tamoxifen 

## 2024-10-09 NOTE — Assessment & Plan Note (Signed)
 Chronic Blood pressure well controlled Continue Coreg  12.5 mg twice daily, Entresto  24-26 mg twice daily, spironolactone  25 mg daily Check CMP, CBC

## 2024-10-09 NOTE — Telephone Encounter (Signed)
 Pt had apt with PCP today and was to have a lab INR draw with other labs the PCP ordered.  The lab INR was not drawn. Lab reports the order was not placed correctly and it was not seen due to that.   Tried to contact pt but had to LVM.

## 2024-10-09 NOTE — Assessment & Plan Note (Signed)
 Chronic Following with cardiology On warfarin which is monitored at our Coumadin clinic Rate controlled with carvedilol 12.5 mg twice daily

## 2024-10-10 ENCOUNTER — Ambulatory Visit

## 2024-10-10 DIAGNOSIS — Z7901 Long term (current) use of anticoagulants: Secondary | ICD-10-CM

## 2024-10-10 LAB — POCT INR: INR: 1.9 — AB (ref 2.0–3.0)

## 2024-10-10 NOTE — Patient Instructions (Addendum)
 Pre visit review using our clinic review tool, if applicable. No additional management support is needed unless otherwise documented below in the visit note.  Increase dose today to take 1 tablet and then change weekly dose to take 1 tablet daily except take 1 1/2 tablets on Monday and Friday. Recheck in 2 weeks.

## 2024-10-10 NOTE — Progress Notes (Signed)
 Indication: Afib Increase dose today to take 1 tablet and then change weekly dose to take 1 tablet daily except take 1 1/2 tablets on Monday and Friday. Recheck in 2 weeks.

## 2024-10-24 ENCOUNTER — Ambulatory Visit

## 2024-11-10 ENCOUNTER — Ambulatory Visit: Admitting: Cardiology

## 2024-12-08 ENCOUNTER — Ambulatory Visit (HOSPITAL_COMMUNITY)

## 2024-12-15 ENCOUNTER — Encounter

## 2025-01-13 ENCOUNTER — Inpatient Hospital Stay: Admitting: Hematology

## 2025-01-13 ENCOUNTER — Inpatient Hospital Stay

## 2025-04-08 ENCOUNTER — Ambulatory Visit: Admitting: Internal Medicine

## 2025-08-18 ENCOUNTER — Ambulatory Visit
